# Patient Record
Sex: Female | Born: 1950
Health system: Midwestern US, Community
[De-identification: ages and names within clinical notes are randomized; demographics above are authoritative.]

## PROBLEM LIST (undated history)

## (undated) DIAGNOSIS — M1990 Unspecified osteoarthritis, unspecified site: Secondary | ICD-10-CM

## (undated) DIAGNOSIS — F419 Anxiety disorder, unspecified: Secondary | ICD-10-CM

## (undated) DIAGNOSIS — E7800 Pure hypercholesterolemia, unspecified: Secondary | ICD-10-CM

## (undated) DIAGNOSIS — J189 Pneumonia, unspecified organism: Secondary | ICD-10-CM

## (undated) DIAGNOSIS — N281 Cyst of kidney, acquired: Secondary | ICD-10-CM

## (undated) DIAGNOSIS — L299 Pruritus, unspecified: Secondary | ICD-10-CM

## (undated) DIAGNOSIS — G479 Sleep disorder, unspecified: Secondary | ICD-10-CM

## (undated) DIAGNOSIS — I10 Essential (primary) hypertension: Secondary | ICD-10-CM

## (undated) DIAGNOSIS — T4145XA Adverse effect of unspecified anesthetic, initial encounter: Secondary | ICD-10-CM

## (undated) DIAGNOSIS — D497 Neoplasm of unspecified behavior of endocrine glands and other parts of nervous system: Secondary | ICD-10-CM

## (undated) DIAGNOSIS — B029 Zoster without complications: Secondary | ICD-10-CM

## (undated) DIAGNOSIS — R002 Palpitations: Secondary | ICD-10-CM

## (undated) DIAGNOSIS — L304 Erythema intertrigo: Secondary | ICD-10-CM

## (undated) DIAGNOSIS — B009 Herpesviral infection, unspecified: Secondary | ICD-10-CM

## (undated) DIAGNOSIS — M545 Low back pain, unspecified: Secondary | ICD-10-CM

## (undated) DIAGNOSIS — G062 Extradural and subdural abscess, unspecified: Secondary | ICD-10-CM

## (undated) DIAGNOSIS — I509 Heart failure, unspecified: Secondary | ICD-10-CM

## (undated) DIAGNOSIS — T8859XA Other complications of anesthesia, initial encounter: Secondary | ICD-10-CM

## (undated) DIAGNOSIS — A498 Other bacterial infections of unspecified site: Secondary | ICD-10-CM

## (undated) DIAGNOSIS — G43909 Migraine, unspecified, not intractable, without status migrainosus: Secondary | ICD-10-CM

## (undated) DIAGNOSIS — K219 Gastro-esophageal reflux disease without esophagitis: Secondary | ICD-10-CM

## (undated) DIAGNOSIS — G8929 Other chronic pain: Secondary | ICD-10-CM

## (undated) DIAGNOSIS — J42 Unspecified chronic bronchitis: Secondary | ICD-10-CM

## (undated) DIAGNOSIS — K589 Irritable bowel syndrome without diarrhea: Secondary | ICD-10-CM

## (undated) DIAGNOSIS — M4647 Discitis, unspecified, lumbosacral region: Secondary | ICD-10-CM

## (undated) DIAGNOSIS — M4804 Spinal stenosis, thoracic region: Secondary | ICD-10-CM

## (undated) DIAGNOSIS — Z79899 Other long term (current) drug therapy: Secondary | ICD-10-CM

## (undated) DIAGNOSIS — D472 Monoclonal gammopathy: Secondary | ICD-10-CM

## (undated) DIAGNOSIS — C9000 Multiple myeloma not having achieved remission: Secondary | ICD-10-CM

## (undated) DIAGNOSIS — M5030 Other cervical disc degeneration, unspecified cervical region: Secondary | ICD-10-CM

## (undated) DIAGNOSIS — Z1231 Encounter for screening mammogram for malignant neoplasm of breast: Secondary | ICD-10-CM

## (undated) DIAGNOSIS — M542 Cervicalgia: Secondary | ICD-10-CM

## (undated) DIAGNOSIS — I5032 Chronic diastolic (congestive) heart failure: Principal | ICD-10-CM

## (undated) DIAGNOSIS — R109 Unspecified abdominal pain: Secondary | ICD-10-CM

## (undated) DIAGNOSIS — Z1211 Encounter for screening for malignant neoplasm of colon: Secondary | ICD-10-CM

## (undated) DIAGNOSIS — E559 Vitamin D deficiency, unspecified: Secondary | ICD-10-CM

## (undated) DIAGNOSIS — M5416 Radiculopathy, lumbar region: Secondary | ICD-10-CM

## (undated) DIAGNOSIS — K2950 Unspecified chronic gastritis without bleeding: Secondary | ICD-10-CM

## (undated) DIAGNOSIS — R0600 Dyspnea, unspecified: Secondary | ICD-10-CM

## (undated) DIAGNOSIS — M810 Age-related osteoporosis without current pathological fracture: Secondary | ICD-10-CM

## (undated) DIAGNOSIS — C180 Malignant neoplasm of cecum: Secondary | ICD-10-CM

## (undated) DIAGNOSIS — D709 Neutropenia, unspecified: Secondary | ICD-10-CM

## (undated) DIAGNOSIS — N179 Acute kidney failure, unspecified: Secondary | ICD-10-CM

## (undated) DIAGNOSIS — R1084 Generalized abdominal pain: Secondary | ICD-10-CM

## (undated) DIAGNOSIS — Z91041 Radiographic dye allergy status: Secondary | ICD-10-CM

## (undated) DIAGNOSIS — Z0000 Encounter for general adult medical examination without abnormal findings: Secondary | ICD-10-CM

## (undated) DIAGNOSIS — R0689 Other abnormalities of breathing: Secondary | ICD-10-CM

## (undated) HISTORY — PX: BACK SURGERY: SHX140

## (undated) HISTORY — PX: CHOLECYSTECTOMY OPEN: SUR202

## (undated) HISTORY — PX: BREAST LUMPECTOMY: SHX2

## (undated) HISTORY — PX: DILATION AND CURETTAGE OF UTERUS: SHX78

## (undated) HISTORY — DX: Extradural and subdural abscess, unspecified: G06.2

## (undated) HISTORY — DX: Discitis, unspecified, lumbosacral region: M46.47

## (undated) HISTORY — PX: CARPAL TUNNEL RELEASE: SHX101

## (undated) HISTORY — PX: TUBAL LIGATION: SHX77

## (undated) HISTORY — DX: Herpesviral infection, unspecified: B00.9

## (undated) HISTORY — PX: VENTRAL HERNIA REPAIR: SHX424

## (undated) HISTORY — PX: CYSTECTOMY: SUR359

## (undated) HISTORY — PX: COLECTOMY: SHX59

## (undated) HISTORY — PX: SALPINGOOPHORECTOMY: SHX82

## (undated) HISTORY — PX: HERNIA REPAIR: SHX51

## (undated) HISTORY — PX: COLONOSCOPY: SHX174

## (undated) HISTORY — DX: Zoster without complications: B02.9

## (undated) HISTORY — DX: Other bacterial infections of unspecified site: A49.8

## (undated) HISTORY — PX: JOINT REPLACEMENT: SHX530

## (undated) HISTORY — PX: BLADDER SUSPENSION: SHX72

## (undated) HISTORY — PX: ESOPHAGOGASTRODUODENOSCOPY: SHX1529

## (undated) HISTORY — DX: Neoplasm of unspecified behavior of endocrine glands and other parts of nervous system: D49.7

## (undated) HISTORY — DX: Pruritus, unspecified: L29.9

## (undated) HISTORY — PX: APPENDECTOMY: SHX54

## (undated) HISTORY — DX: Erythema intertrigo: L30.4

## (undated) HISTORY — PX: TOTAL KNEE ARTHROPLASTY: SHX125

## (undated) HISTORY — PX: ABDOMINAL HYSTERECTOMY: SHX81

## (undated) HISTORY — PX: FRACTURE SURGERY: SHX138

---
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
## 1977-08-19 HISTORY — PX: FEMUR FRACTURE SURGERY: SHX633

## 1978-08-19 HISTORY — PX: FEMUR HARDWARE REMOVAL: SUR1124

## 2004-06-14 ENCOUNTER — Emergency Department (HOSPITAL_COMMUNITY): Admission: EM | Admit: 2004-06-14 | Discharge: 2004-06-14 | Payer: Self-pay | Admitting: Emergency Medicine

## 2004-08-14 ENCOUNTER — Emergency Department (HOSPITAL_COMMUNITY): Admission: EM | Admit: 2004-08-14 | Discharge: 2004-08-14 | Payer: Self-pay | Admitting: Family Medicine

## 2005-01-22 ENCOUNTER — Ambulatory Visit (HOSPITAL_BASED_OUTPATIENT_CLINIC_OR_DEPARTMENT_OTHER): Admission: RE | Admit: 2005-01-22 | Discharge: 2005-01-22 | Payer: Self-pay | Admitting: Orthopaedic Surgery

## 2005-01-22 ENCOUNTER — Ambulatory Visit (HOSPITAL_COMMUNITY): Admission: RE | Admit: 2005-01-22 | Discharge: 2005-01-22 | Payer: Self-pay | Admitting: Orthopaedic Surgery

## 2005-03-05 ENCOUNTER — Ambulatory Visit (HOSPITAL_COMMUNITY): Admission: RE | Admit: 2005-03-05 | Discharge: 2005-03-05 | Payer: Self-pay | Admitting: Orthopaedic Surgery

## 2005-03-05 ENCOUNTER — Ambulatory Visit (HOSPITAL_BASED_OUTPATIENT_CLINIC_OR_DEPARTMENT_OTHER): Admission: RE | Admit: 2005-03-05 | Discharge: 2005-03-05 | Payer: Self-pay | Admitting: Orthopaedic Surgery

## 2005-06-13 ENCOUNTER — Encounter: Admission: RE | Admit: 2005-06-13 | Discharge: 2005-06-13 | Payer: Self-pay | Admitting: Internal Medicine

## 2005-09-10 ENCOUNTER — Emergency Department (HOSPITAL_COMMUNITY): Admission: EM | Admit: 2005-09-10 | Discharge: 2005-09-10 | Payer: Self-pay | Admitting: Emergency Medicine

## 2005-09-12 ENCOUNTER — Emergency Department (HOSPITAL_COMMUNITY): Admission: EM | Admit: 2005-09-12 | Discharge: 2005-09-12 | Payer: Self-pay | Admitting: Emergency Medicine

## 2005-09-23 ENCOUNTER — Encounter: Admission: RE | Admit: 2005-09-23 | Discharge: 2005-09-23 | Payer: Self-pay | Admitting: Internal Medicine

## 2005-11-08 ENCOUNTER — Encounter: Admission: RE | Admit: 2005-11-08 | Discharge: 2005-11-08 | Payer: Self-pay | Admitting: Internal Medicine

## 2006-02-04 ENCOUNTER — Ambulatory Visit (HOSPITAL_COMMUNITY): Admission: RE | Admit: 2006-02-04 | Discharge: 2006-02-04 | Payer: Self-pay | Admitting: Gastroenterology

## 2006-02-04 ENCOUNTER — Encounter (INDEPENDENT_AMBULATORY_CARE_PROVIDER_SITE_OTHER): Payer: Self-pay | Admitting: Specialist

## 2006-05-16 ENCOUNTER — Encounter: Admission: RE | Admit: 2006-05-16 | Discharge: 2006-05-16 | Payer: Self-pay | Admitting: Internal Medicine

## 2006-06-30 ENCOUNTER — Encounter: Admission: RE | Admit: 2006-06-30 | Discharge: 2006-06-30 | Payer: Self-pay | Admitting: Internal Medicine

## 2006-07-28 ENCOUNTER — Emergency Department (HOSPITAL_COMMUNITY): Admission: EM | Admit: 2006-07-28 | Discharge: 2006-07-28 | Payer: Self-pay | Admitting: Emergency Medicine

## 2006-07-31 ENCOUNTER — Encounter: Admission: RE | Admit: 2006-07-31 | Discharge: 2006-07-31 | Payer: Self-pay | Admitting: Orthopaedic Surgery

## 2006-09-18 ENCOUNTER — Ambulatory Visit (HOSPITAL_COMMUNITY): Admission: RE | Admit: 2006-09-18 | Discharge: 2006-09-18 | Payer: Self-pay | Admitting: Cardiovascular Disease

## 2006-09-25 ENCOUNTER — Ambulatory Visit (HOSPITAL_COMMUNITY): Admission: RE | Admit: 2006-09-25 | Discharge: 2006-09-25 | Payer: Self-pay | Admitting: Orthopaedic Surgery

## 2006-10-08 ENCOUNTER — Encounter: Admission: RE | Admit: 2006-10-08 | Discharge: 2007-01-06 | Payer: Self-pay | Admitting: Orthopaedic Surgery

## 2006-11-06 ENCOUNTER — Emergency Department (HOSPITAL_COMMUNITY): Admission: EM | Admit: 2006-11-06 | Discharge: 2006-11-06 | Payer: Self-pay | Admitting: Emergency Medicine

## 2007-01-07 ENCOUNTER — Encounter: Admission: RE | Admit: 2007-01-07 | Discharge: 2007-04-07 | Payer: Self-pay | Admitting: Orthopaedic Surgery

## 2007-03-08 ENCOUNTER — Emergency Department (HOSPITAL_COMMUNITY): Admission: EM | Admit: 2007-03-08 | Discharge: 2007-03-08 | Payer: Self-pay | Admitting: Emergency Medicine

## 2007-07-23 ENCOUNTER — Encounter: Admission: RE | Admit: 2007-07-23 | Discharge: 2007-07-23 | Payer: Self-pay | Admitting: Internal Medicine

## 2007-11-16 ENCOUNTER — Inpatient Hospital Stay (HOSPITAL_COMMUNITY): Admission: EM | Admit: 2007-11-16 | Discharge: 2007-11-18 | Payer: Self-pay | Admitting: Emergency Medicine

## 2008-02-02 ENCOUNTER — Ambulatory Visit: Payer: Self-pay | Admitting: Vascular Surgery

## 2008-02-02 ENCOUNTER — Encounter (INDEPENDENT_AMBULATORY_CARE_PROVIDER_SITE_OTHER): Payer: Self-pay | Admitting: Emergency Medicine

## 2008-02-02 ENCOUNTER — Emergency Department (HOSPITAL_COMMUNITY): Admission: EM | Admit: 2008-02-02 | Discharge: 2008-02-02 | Payer: Self-pay | Admitting: Emergency Medicine

## 2008-05-10 ENCOUNTER — Inpatient Hospital Stay (HOSPITAL_COMMUNITY): Admission: RE | Admit: 2008-05-10 | Discharge: 2008-05-14 | Payer: Self-pay | Admitting: Orthopaedic Surgery

## 2008-06-08 ENCOUNTER — Emergency Department (HOSPITAL_COMMUNITY): Admission: EM | Admit: 2008-06-08 | Discharge: 2008-06-08 | Payer: Self-pay | Admitting: Family Medicine

## 2008-06-13 ENCOUNTER — Encounter: Admission: RE | Admit: 2008-06-13 | Discharge: 2008-07-12 | Payer: Self-pay | Admitting: Orthopaedic Surgery

## 2008-06-14 ENCOUNTER — Emergency Department (HOSPITAL_COMMUNITY): Admission: EM | Admit: 2008-06-14 | Discharge: 2008-06-14 | Payer: Self-pay | Admitting: Emergency Medicine

## 2008-09-13 ENCOUNTER — Encounter: Admission: RE | Admit: 2008-09-13 | Discharge: 2008-09-13 | Payer: Self-pay | Admitting: Internal Medicine

## 2009-01-08 ENCOUNTER — Emergency Department (HOSPITAL_COMMUNITY): Admission: EM | Admit: 2009-01-08 | Discharge: 2009-01-08 | Payer: Self-pay | Admitting: Emergency Medicine

## 2009-01-17 ENCOUNTER — Emergency Department (HOSPITAL_COMMUNITY): Admission: EM | Admit: 2009-01-17 | Discharge: 2009-01-17 | Payer: Self-pay | Admitting: Family Medicine

## 2009-03-27 ENCOUNTER — Emergency Department (HOSPITAL_COMMUNITY): Admission: EM | Admit: 2009-03-27 | Discharge: 2009-03-27 | Payer: Self-pay | Admitting: Family Medicine

## 2009-04-01 ENCOUNTER — Emergency Department (HOSPITAL_COMMUNITY): Admission: EM | Admit: 2009-04-01 | Discharge: 2009-04-01 | Payer: Self-pay | Admitting: Emergency Medicine

## 2009-04-28 ENCOUNTER — Encounter: Admission: RE | Admit: 2009-04-28 | Discharge: 2009-04-28 | Payer: Self-pay | Admitting: Orthopedic Surgery

## 2009-07-06 ENCOUNTER — Inpatient Hospital Stay (HOSPITAL_COMMUNITY): Admission: RE | Admit: 2009-07-06 | Discharge: 2009-07-11 | Payer: Self-pay | Admitting: General Surgery

## 2009-07-25 ENCOUNTER — Emergency Department (HOSPITAL_COMMUNITY): Admission: EM | Admit: 2009-07-25 | Discharge: 2009-07-25 | Payer: Self-pay | Admitting: Emergency Medicine

## 2009-08-05 ENCOUNTER — Emergency Department (HOSPITAL_COMMUNITY): Admission: EM | Admit: 2009-08-05 | Discharge: 2009-08-05 | Payer: Self-pay | Admitting: Emergency Medicine

## 2009-11-24 ENCOUNTER — Encounter: Admission: RE | Admit: 2009-11-24 | Discharge: 2009-11-24 | Payer: Self-pay | Admitting: Internal Medicine

## 2009-12-20 ENCOUNTER — Emergency Department (HOSPITAL_COMMUNITY): Admission: EM | Admit: 2009-12-20 | Discharge: 2009-12-20 | Payer: Self-pay | Admitting: Emergency Medicine

## 2010-11-06 LAB — BASIC METABOLIC PANEL
BUN: 7 mg/dL (ref 6–23)
Chloride: 105 mEq/L (ref 96–112)
Glucose, Bld: 98 mg/dL (ref 70–99)
Potassium: 3.8 mEq/L (ref 3.5–5.1)

## 2010-11-06 LAB — CBC
HCT: 40.6 % (ref 36.0–46.0)
MCHC: 32.8 g/dL (ref 30.0–36.0)
MCV: 83.5 fL (ref 78.0–100.0)
Platelets: 203 10*3/uL (ref 150–400)
RDW: 14.5 % (ref 11.5–15.5)

## 2010-11-06 LAB — URINALYSIS, ROUTINE W REFLEX MICROSCOPIC
Bilirubin Urine: NEGATIVE
Ketones, ur: NEGATIVE mg/dL
Nitrite: NEGATIVE
Specific Gravity, Urine: 1.016 (ref 1.005–1.030)
Urobilinogen, UA: 0.2 mg/dL (ref 0.0–1.0)

## 2010-11-06 LAB — DIFFERENTIAL
Basophils Absolute: 0 10*3/uL (ref 0.0–0.1)
Eosinophils Absolute: 0.3 10*3/uL (ref 0.0–0.7)
Eosinophils Relative: 6 % — ABNORMAL HIGH (ref 0–5)

## 2010-11-14 ENCOUNTER — Other Ambulatory Visit: Payer: Self-pay | Admitting: Internal Medicine

## 2010-11-14 DIAGNOSIS — Z1231 Encounter for screening mammogram for malignant neoplasm of breast: Secondary | ICD-10-CM

## 2010-11-20 LAB — URINALYSIS, ROUTINE W REFLEX MICROSCOPIC
Bilirubin Urine: NEGATIVE
Glucose, UA: NEGATIVE mg/dL
Hgb urine dipstick: NEGATIVE
Specific Gravity, Urine: 1.021 (ref 1.005–1.030)
Urobilinogen, UA: 0.2 mg/dL (ref 0.0–1.0)
pH: 5.5 (ref 5.0–8.0)

## 2010-11-20 LAB — COMPREHENSIVE METABOLIC PANEL
AST: 14 U/L (ref 0–37)
Albumin: 3.1 g/dL — ABNORMAL LOW (ref 3.5–5.2)
Chloride: 105 mEq/L (ref 96–112)
Creatinine, Ser: 1 mg/dL (ref 0.4–1.2)
GFR calc Af Amer: >60 mL/min (ref >60)
Potassium: 3.8 mEq/L (ref 3.5–5.1)
Total Bilirubin: 0.4 mg/dL (ref 0.3–1.2)

## 2010-11-20 LAB — URINE MICROSCOPIC-ADD ON

## 2010-11-20 LAB — DIFFERENTIAL
Basophils Absolute: 0 10*3/uL (ref 0.0–0.1)
Eosinophils Relative: 2 % (ref 0–5)
Lymphocytes Relative: 24 % (ref 12–46)
Monocytes Absolute: 0.4 10*3/uL (ref 0.1–1.0)

## 2010-11-20 LAB — CBC
MCV: 85.8 fL (ref 78.0–100.0)
Platelets: 294 10*3/uL (ref 150–400)
WBC: 7.1 10*3/uL (ref 4.0–10.5)

## 2010-11-21 LAB — BASIC METABOLIC PANEL
BUN: 7 mg/dL (ref 6–23)
CO2: 28 mEq/L (ref 19–32)
Calcium: 7.9 mg/dL — ABNORMAL LOW (ref 8.4–10.5)
Chloride: 103 mEq/L (ref 96–112)
Creatinine, Ser: 1.12 mg/dL (ref 0.4–1.2)
GFR calc Af Amer: >60 mL/min (ref >60)
Glucose, Bld: 140 mg/dL — ABNORMAL HIGH (ref 70–99)

## 2010-11-21 LAB — COMPREHENSIVE METABOLIC PANEL
ALT: 14 U/L (ref 0–35)
Alkaline Phosphatase: 69 U/L (ref 39–117)
BUN: 8 mg/dL (ref 6–23)
Chloride: 105 mEq/L (ref 96–112)
Glucose, Bld: 103 mg/dL — ABNORMAL HIGH (ref 70–99)
Potassium: 4 mEq/L (ref 3.5–5.1)
Total Bilirubin: 0.5 mg/dL (ref 0.3–1.2)

## 2010-11-21 LAB — BLOOD GAS, ARTERIAL
Acid-Base Excess: 2.9 mmol/L — ABNORMAL HIGH (ref 0.0–2.0)
Acid-base deficit: 2.6 mmol/L — ABNORMAL HIGH (ref 0.0–2.0)
Drawn by: 308601
FIO2: 0.4 %
FIO2: 0.5 %
MECHVT: 700 mL
Patient temperature: 98.6
RATE: 10 resp/min
TCO2: 22.6 mmol/L (ref 0–100)
TCO2: 23 mmol/L (ref 0–100)
pCO2 arterial: 34.8 mmHg — ABNORMAL LOW (ref 35.0–45.0)
pCO2 arterial: 54.6 mmHg — ABNORMAL HIGH (ref 35.0–45.0)
pO2, Arterial: 107 mmHg — ABNORMAL HIGH (ref 80.0–100.0)

## 2010-11-21 LAB — DIFFERENTIAL
Basophils Absolute: 0 10*3/uL (ref 0.0–0.1)
Basophils Relative: 1 % (ref 0–1)
Eosinophils Absolute: 0.2 10*3/uL (ref 0.0–0.7)
Monocytes Absolute: 0.4 10*3/uL (ref 0.1–1.0)
Neutro Abs: 3 10*3/uL (ref 1.7–7.7)
Neutrophils Relative %: 54 % (ref 43–77)

## 2010-11-21 LAB — CBC
HCT: 40.8 % (ref 36.0–46.0)
Hemoglobin: 13.2 g/dL (ref 12.0–15.0)
MCHC: 32.1 g/dL (ref 30.0–36.0)
MCV: 87.7 fL (ref 78.0–100.0)
Platelets: 204 10*3/uL (ref 150–400)
RBC: 4.17 MIL/uL (ref 3.87–5.11)
RBC: 4.72 MIL/uL (ref 3.87–5.11)
RDW: 13.7 % (ref 11.5–15.5)
WBC: 5.6 10*3/uL (ref 4.0–10.5)

## 2010-11-21 LAB — BRAIN NATRIURETIC PEPTIDE: Pro B Natriuretic peptide (BNP): 73.6 pg/mL (ref 0.0–100.0)

## 2010-11-21 LAB — TYPE AND SCREEN: ABO/RH(D): B POS

## 2010-11-21 LAB — PROTIME-INR: INR: 1.03 (ref 0.00–1.49)

## 2010-11-24 LAB — COMPREHENSIVE METABOLIC PANEL
ALT: 20 U/L (ref 0–35)
AST: 22 U/L (ref 0–37)
Albumin: 3.3 g/dL — ABNORMAL LOW (ref 3.5–5.2)
CO2: 26 mEq/L (ref 19–32)
Calcium: 8.5 mg/dL (ref 8.4–10.5)
GFR calc Af Amer: >60 mL/min (ref >60)
GFR calc non Af Amer: >60 mL/min (ref >60)
Sodium: 138 mEq/L (ref 135–145)
Total Protein: 7 g/dL (ref 6.0–8.3)

## 2010-11-24 LAB — DIFFERENTIAL
Basophils Absolute: 0.1 10*3/uL (ref 0.0–0.1)
Eosinophils Relative: 5 % (ref 0–5)
Monocytes Absolute: 0.5 10*3/uL (ref 0.1–1.0)
Neutrophils Relative %: 51 % (ref 43–77)

## 2010-11-24 LAB — CBC
MCV: 85.4 fL (ref 78.0–100.0)
Platelets: 214 10*3/uL (ref 150–400)
RDW: 13.7 % (ref 11.5–15.5)
WBC: 6.6 10*3/uL (ref 4.0–10.5)

## 2010-11-24 LAB — URINALYSIS, ROUTINE W REFLEX MICROSCOPIC
Glucose, UA: NEGATIVE mg/dL
Protein, ur: NEGATIVE mg/dL
pH: 5.5 (ref 5.0–8.0)

## 2010-11-24 LAB — URINE MICROSCOPIC-ADD ON

## 2010-11-26 ENCOUNTER — Other Ambulatory Visit: Payer: Self-pay | Admitting: Internal Medicine

## 2010-11-26 ENCOUNTER — Ambulatory Visit
Admission: RE | Admit: 2010-11-26 | Discharge: 2010-11-26 | Disposition: A | Discharge status: Home / Self Care | Payer: Medicaid Other | Source: Ambulatory Visit | Attending: Internal Medicine | Admitting: Internal Medicine

## 2010-11-26 DIAGNOSIS — Z1231 Encounter for screening mammogram for malignant neoplasm of breast: Secondary | ICD-10-CM

## 2010-11-26 DIAGNOSIS — N6452 Nipple discharge: Secondary | ICD-10-CM

## 2010-11-28 ENCOUNTER — Other Ambulatory Visit: Payer: Self-pay | Admitting: Internal Medicine

## 2010-11-28 ENCOUNTER — Ambulatory Visit
Admission: RE | Admit: 2010-11-28 | Discharge: 2010-11-28 | Disposition: A | Discharge status: Home / Self Care | Payer: Medicaid Other | Source: Ambulatory Visit | Attending: Internal Medicine | Admitting: Internal Medicine

## 2010-11-28 DIAGNOSIS — N6452 Nipple discharge: Secondary | ICD-10-CM

## 2010-12-11 ENCOUNTER — Ambulatory Visit
Admission: RE | Admit: 2010-12-11 | Discharge: 2010-12-11 | Disposition: A | Discharge status: Home / Self Care | Payer: Medicaid Other | Source: Ambulatory Visit | Attending: Internal Medicine | Admitting: Internal Medicine

## 2010-12-11 DIAGNOSIS — N6452 Nipple discharge: Secondary | ICD-10-CM

## 2010-12-18 HISTORY — PX: CARDIAC CATHETERIZATION: SHX172

## 2011-01-01 NOTE — Discharge Summary (Signed)
[name], [name]              ACCOUNT NO.:  [id number]   MEDICAL RECORD NO.:  [id number]          PATIENT TYPE:  INP   LOCATION:  3733                         FACILITY:  [facility]   PHYSICIAN:  Fleet Contras, M.D.    DATE OF BIRTH:  10-30-1950   DATE OF ADMISSION:  11/16/2007  DATE OF DISCHARGE:  11/18/2007                               DISCHARGE SUMMARY   HISTORY OF PRESENTING ILLNESS:  [name] is a 60 year old African  American lady with multiple medical problems including systemic  hypertension, congestive heart failure, chronic bronchitis,  dyslipidemia, gastroesophageal reflux disease, depression and anxiety,  and degenerative joint disease of the shoulders.  She came to the  emergency room at [facility] with 1-day history of chest pain,  centrally located, sharp, nonradiating, associated with nausea,  heartburn, abdominal cramp/pain, and episodes of watery diarrhea.  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  She stated that she  still has some dull ache which has been not as severe as it was.  She  had been nauseous but no vomiting.  She had no shortness of breath,  headaches, dizziness, palpitations, or diaphoresis.  At the emergency  room, her vital signs were stable.  An initial workup including CK,  troponin, EKG, and chest x-ray, were all negative.  The patient was  admitted to rule out acute coronary syndrome.   HOSPITAL COURSE:  On admission, the patient was placed on telemetry  monitoring.  She was on Vicodin 5/500 1-2 p.o. q.6 h. p.r.n. for pain  and morphine 1-2 mg q.4 h. p.r.n. for pain, Zofran 4 mg IV p.r.n. for  nausea and vomiting q.4 h.  She had a recent acute sinusitis and  bronchitis and was on Z-Pak and Tussionex at home.  She had been  coughing quite a bit.  On admission, she was tender to the  anterior  chest wall on palpation, and serial CKs and troponin were performed x3  were all negative, ruling out acute coronary syndrome.  Chest x-ray  showed no acute infiltrates, and her BNP was less than 30.   PHYSICAL EXAMINATION:  Her vital signs have remained stable throughout  the hospitalization, and today she is feeling much better.  She has no  further chest pain, no shortness of breath.  She has no fevers or  chills.  She is tolerating some oral intake.  VITAL SIGNS:  This morning show the blood pressure of 104/64, heart rate  of 74, respiratory rate of 20, temperature 99.6, and O2 sats on room air  is 98%.  She is not pale.  She is not icteric.  She has no cyanosis.  CHEST:  Clear to auscultation.  HEART:  Sounds S1 and S2, heard with no murmurs.  ABDOMEN:  Full, obese, soft, nontender, and no masses.  Bowel sounds are  present.  EXTREMITIES:  Show no edema.   LABORATORY DATA:  White count is 6.8, hemoglobin 13.6, and hematocrit  41.2.  Sodium was 137, potassium 4.4, chloride 99, bicarbonate of 28,  BUN is 16, creatinine 1.5, glucose is 118.   ASSESSMENT:  [name] is a 60-year African American lady with multiple  medical problems, admitted with atypical chest pain.  Acute coronary  syndrome has been ruled out, and she is now considered stable for  discharge home today.   DISCHARGE DIAGNOSES:  1. Atypical chest pain.  2. Acute sinusitis and bronchitis, currently on antibiotics and      bronchodilators, with improvement.  3. Gastroenteritis, likely related to her medications.  4. Gastroesophageal reflux disease.  5. Systemic hypertension, well controlled.   PLAN OF CARE:  She will be discharged home today.   DISCHARGE MEDICATIONS:  Will include:  1. Lotrel 5/40 once a day.  2. Lipitor 10 mg daily.  3. Potassium chloride 10 mEq once a day.  4. Protonix 40 mg daily.  5. Ambien 10 mg at bedtime.  6. Celexa 20 mg daily.  7. Lasix 40 mg daily.  8. Vicodin 5/500 one  to two p.o. q.6 h. p.r.n.  9. Avelox 400 mg daily for 5 more days.  10.Tussionex 5 mL b.i.d. p.r.n.   This plan of care has been discussed with her.  Her questions are  answered.  She will follow up with me in about 3 weeks.  We will resume  her home health aid.      Fleet Contras, M.D.  Electronically Signed     EA/MEDQ  D:  11/18/2007  T:  11/18/2007  Job:  [id number]

## 2011-01-01 NOTE — Op Note (Signed)
[name], [name]              ACCOUNT NO.:  [id number]   MEDICAL RECORD NO.:  [id number]          PATIENT TYPE:  INP   LOCATION:  5034                         FACILITY:  [facility]   PHYSICIAN:  Lubertha Basque. Dalldorf, M.D.DATE OF BIRTH:  Jan 27, 1951   DATE OF PROCEDURE:  05/10/2008  DATE OF DISCHARGE:                               OPERATIVE REPORT   PREOPERATIVE DIAGNOSIS:  Right knee degenerative arthritis.   POSTOPERATIVE DIAGNOSIS:  Right knee degenerative arthritis.   PROCEDURE:  Right total knee replacement.   ANESTHESIA:  General and block.   ATTENDING SURGEON:  Lubertha Basque. Jerl Santos, MD   ASSISTANT:  Lindwood Qua, PA   INDICATION FOR PROCEDURE:  The patient is a 60 year old woman with a  long history of painful knees.  This has persisted despite various oral  anti-inflammatories and injectables.  She has had an arthroscopy many  years ago.  By x-ray, she has advanced degenerative change and she has  pain which limits her ability to rest and walk.  She is offered a knee  replacement.  Informed operative consent was obtained after discussion  of possible complications including reaction to anesthesia, infection,  DVT, PE, and death.   SUMMARY OF FINDINGS AND PROCEDURE:  Under general anesthesia and a block  through a standard longitudinal anterior approach, a right knee  replacement was performed.  She had advanced degenerative change in all  three compartments and fair bone quality.  We addressed her problem with  a cemented DePuy LCS System.  We used a standard plus femur with a 3 MBT  revision tray to address her stature.  We also placed a 10-mm deep-dish  spacer and a 38-mm all polyethylene patella.  Bryna Colander assisted  throughout and was invaluable to the completion of the case in that he  helped position and retract while I performed the procedure.  He also  closed simultaneously to help minimize OR time.   DESCRIPTION OF PROCEDURE:  The patient was taken to the  operating suite  where general anesthetic was applied without difficulty.  She was also  given a block in the preanesthesia area.  She was positioned supine and  prepped and draped in normal sterile fashion.  After the administration  of IV Kefzol, the right leg was elevated, exsanguinated, and tourniquet  inflated about the thigh.  A longitudinal anterior incision was made  with dissection down the extensor mechanism.  All appropriate anti-  infected measures were used including closed hooded exhaust systems for  each member of the surgical team, preoperative IV antibiotic, and  Betadine impregnated drape.  A medial parapatellar incision was made.  The kneecap was flipped and the knee flexed.  Some residual meniscal  tissues were removed along with the ACL and PCL.  Most of the fat pad  was also excised.  I placed an intramedullary guide in the tibia to make  a cut with a slight posterior tilt.  We then placed another  intramedullary guide in the femur to make anterior and posterior cuts  creating a flexion gap of 10 mm.  A second intramedullary guide was  placed in the femur to make a distal cut creating an equal extension gap  of 10 mm balancing the knee.  Minimal soft tissue release was required  off the tibia.  The femur sized to a standard plus and the tibia to a 3  with appropriate guides placed and utilized.  We did ream for a short  stem on the tibia to address her stature.  The patella was cut down in  thickness by 9 mm to 15 and sized to a 38 with the appropriate guide  placed and utilized.  Trial reduction was done with all these components  and she easily came to full extension and flexed well.  The patella  tracked well.  Trial components removed followed by pulsatile lavage  irrigation of all bony surfaces.  Cement was mixed including Zinacef and  was pressurized onto all 3 bones followed by placement of the  aforementioned DePuy LCS components.  Excess cement was trimmed  and  pressure was held until the cement had hardened.  The tourniquet was  deflated and a small amount of bleeding was easily controlled with Bovie  cautery.  The knee again was irrigated followed by placement of a drain  exiting superolaterally.  The extensor mechanism was reapproximated with  #1 Vicryl in interrupted fashion followed by subcutaneous  reapproximation with 0 and 2-0 undyed Vicryl and skin closure with  staples.  The knee easily flexed to 95 against gravity at the end of the  case which was roughly her preoperative motion.  Adaptic was applied  followed by dry gauze and a loose Ace wrap.  Estimated blood loss and  intraoperative fluids obtained from anesthesia records as can accurate  tourniquet time.   DISPOSITION:  The patient was extubated in the operating room and taken  to recovery room in stable addition.  She is to be admitted to the  Orthopedic Surgery Service for appropriate postop care to include  preoperative antibiotics and Coumadin plus Lovenox for DVT prophylaxis.      Lubertha Basque Jerl Santos, M.D.  Electronically Signed     PGD/MEDQ  D:  05/10/2008  T:  05/11/2008  Job:  [id number]

## 2011-01-01 NOTE — Discharge Summary (Signed)
[name], [name]              ACCOUNT NO.:  [id number]   MEDICAL RECORD NO.:  [id number]          PATIENT TYPE:  INP   LOCATION:  5034                         FACILITY:  [facility]   PHYSICIAN:  Lubertha Basque. Dalldorf, M.D.DATE OF BIRTH:  [birth year]   DATE OF ADMISSION:  05/10/2008  DATE OF DISCHARGE:  05/14/2008                               DISCHARGE SUMMARY   ADMITTING DIAGNOSES:  1. Right knee end-stage degenerative joint disease.  2. Depression.  3. Hypertension.  4. Esophageal reflux.   DISCHARGE DIAGNOSES:  1. Right knee end-stage degenerative joint disease.  2. Depression.  3. Hypertension.  4. Esophageal reflux.   OPERATIONS:  Right total knee replacement.   BRIEF HISTORY:  [name] is a 60 year old white female patient well-  known to our practice complaining of increasing right knee pain.  She  will have pain when she walks and pain when she sleeps at nighttime.  Failed injection and anti-inflammatory medicines.  Her x-rays reveal end-  stage DJD, particularly of the medial compartment.   PERTINENT LABORATORY AND X-RAY FINDINGS:  Hemoglobin 12.1, WBCs 8.9, and  platelets 162.  Serial INRs were done as she was on low-dose Coumadin  protocol.  DVT prophylaxis per Pharmacy.  Sodium 136, potassium 3.7,  chloride 95, CO2 32, glucose 101, BUN 6, and creatinine 0.9.   COURSE IN THE HOSPITAL:  She was admitted postoperatively and placed on  a variety of p.o. and IM analgesics for pain which included a PCA  Dilaudid pump, IV of lactated Ringer's, Coumadin, and Lovenox protocol  per Pharmacy, IV Ancef 1 gram q.8 h. x3 doses and then various oral  medications including muscle relaxers, ferrous sulfate, oral pain  medicines, antiemetics, etc. and knee-high TEDs, incentive spirometry,  CPM machine 0-50, to advance as tolerated.  Physical therapy for out of  bed and weightbearing as tolerated as well.  O2 2 L nasal cannula with  pulse ox and she was kept on her home medicines.   First day postop,  blood pressure was within normal limits and temperature 98.  Lungs were  clear.  Abdomen was soft.  Knee dressing and drain were in place and  normal.  Good neurovascular status.  Foley catheter was in and later  discontinued that day.  [facility] was contacted for help with  the patient on discharge.  On day #2, dressing was changed, wound was  noted to be benign, and drain was pulled.  Lungs continued to be clear.  She was afebrile and vital signs remained normal throughout her hospital  stay.  She was eventually discharged home.   CONDITION ON DISCHARGE:  Improved.   DISCHARGE INSTRUCTIONS:  May change dressing daily.  Low-sodium heart-  healthy diet.  Weightbearing as tolerated.  Dr. Jerl Santos to be seen in  10 days or sooner if there is any problem of infection, [phone].  Coumadin for  2 weeks dose regulated by Pharmacy, Percocet one or two q.4-6 p.r.n.  pain as needed and then followup with Home Care for home therapy and  blood draws.  She will be kept on her home  dose of Lasix, Benicar, and  Catapres patch - 3 to change weekly on Tuesday.      Lindwood Qua, P.A.      Lubertha Basque Jerl Santos, M.D.  Electronically Signed    MC/MEDQ  D:  06/14/2008  T:  06/14/2008  Job:  [id number]

## 2011-01-03 ENCOUNTER — Ambulatory Visit (HOSPITAL_COMMUNITY)
Admission: RE | Admit: 2011-01-03 | Discharge: 2011-01-03 | Disposition: A | Discharge status: Home / Self Care | Payer: Medicaid Other | Source: Ambulatory Visit | Attending: General Surgery | Admitting: General Surgery

## 2011-01-03 ENCOUNTER — Other Ambulatory Visit (HOSPITAL_COMMUNITY): Payer: Self-pay | Admitting: General Surgery

## 2011-01-03 ENCOUNTER — Encounter (HOSPITAL_COMMUNITY)
Admission: RE | Admit: 2011-01-03 | Discharge: 2011-01-03 | Disposition: A | Discharge status: Home / Self Care | Payer: Medicaid Other | Source: Ambulatory Visit | Attending: General Surgery | Admitting: General Surgery

## 2011-01-03 DIAGNOSIS — Z01811 Encounter for preprocedural respiratory examination: Secondary | ICD-10-CM

## 2011-01-03 DIAGNOSIS — Z01812 Encounter for preprocedural laboratory examination: Secondary | ICD-10-CM | POA: Insufficient documentation

## 2011-01-03 DIAGNOSIS — Z01810 Encounter for preprocedural cardiovascular examination: Secondary | ICD-10-CM | POA: Insufficient documentation

## 2011-01-03 DIAGNOSIS — I509 Heart failure, unspecified: Secondary | ICD-10-CM | POA: Insufficient documentation

## 2011-01-03 DIAGNOSIS — C50919 Malignant neoplasm of unspecified site of unspecified female breast: Secondary | ICD-10-CM | POA: Insufficient documentation

## 2011-01-03 LAB — CBC
HCT: 44.3 % (ref 36.0–46.0)
Hemoglobin: 14.8 g/dL (ref 12.0–15.0)
MCHC: 33.4 g/dL (ref 30.0–36.0)
MCV: 86.4 fL (ref 78.0–100.0)
RDW: 13.3 % (ref 11.5–15.5)

## 2011-01-03 LAB — DIFFERENTIAL
Eosinophils Relative: 5 % (ref 0–5)
Lymphocytes Relative: 40 % (ref 12–46)
Lymphs Abs: 2.4 10*3/uL (ref 0.7–4.0)
Monocytes Absolute: 0.5 10*3/uL (ref 0.1–1.0)
Monocytes Relative: 8 % (ref 3–12)
Neutro Abs: 2.9 10*3/uL (ref 1.7–7.7)

## 2011-01-03 LAB — COMPREHENSIVE METABOLIC PANEL
AST: 17 U/L (ref 0–37)
Alkaline Phosphatase: 77 U/L (ref 39–117)
BUN: 8 mg/dL (ref 6–23)
CO2: 33 mEq/L — ABNORMAL HIGH (ref 19–32)
Chloride: 101 mEq/L (ref 96–112)
Creatinine, Ser: 0.93 mg/dL (ref 0.4–1.2)
GFR calc non Af Amer: >60 mL/min (ref >60)
Potassium: 3.7 mEq/L (ref 3.5–5.1)
Total Bilirubin: 0.4 mg/dL (ref 0.3–1.2)

## 2011-01-03 LAB — SURGICAL PCR SCREEN: MRSA, PCR: NEGATIVE

## 2011-01-04 ENCOUNTER — Other Ambulatory Visit: Payer: Self-pay | Admitting: General Surgery

## 2011-01-04 ENCOUNTER — Ambulatory Visit (HOSPITAL_COMMUNITY)
Admission: RE | Admit: 2011-01-04 | Discharge: 2011-01-04 | Disposition: A | Discharge status: Home / Self Care | Payer: Medicaid Other | Source: Ambulatory Visit | Attending: General Surgery | Admitting: General Surgery

## 2011-01-04 DIAGNOSIS — I509 Heart failure, unspecified: Secondary | ICD-10-CM | POA: Insufficient documentation

## 2011-01-04 DIAGNOSIS — N6089 Other benign mammary dysplasias of unspecified breast: Secondary | ICD-10-CM | POA: Insufficient documentation

## 2011-01-04 DIAGNOSIS — D249 Benign neoplasm of unspecified breast: Secondary | ICD-10-CM | POA: Insufficient documentation

## 2011-01-04 DIAGNOSIS — I10 Essential (primary) hypertension: Secondary | ICD-10-CM | POA: Insufficient documentation

## 2011-01-04 NOTE — Op Note (Signed)
NAMESHERLIN, SONIER              ACCOUNT NO.:  [id number]   MEDICAL RECORD NO.:  [id number]          PATIENT TYPE:  AMB   LOCATION:  DSC                          FACILITY:  [facility]   PHYSICIAN:  Lubertha Basque. Dalldorf, M.D.DATE OF BIRTH:  Dec 19, 1950   DATE OF PROCEDURE:  03/05/2005  DATE OF DISCHARGE:                                 OPERATIVE REPORT   PREOPERATIVE DIAGNOSIS:  Right carpal tunnel syndrome.   POSTOPERATIVE DIAGNOSIS:  Right carpal tunnel syndrome.   PROCEDURE:  Right carpal tunnel release.   ANESTHESIA:  Bier block and MAC.   ATTENDING SURGEON:  Lubertha Basque. Jerl Santos, M.D.   ASSISTANT:  Lindwood Qua, P.A.   INDICATIONS:  The patient is a 60 year old woman with a long history of  bilateral hand and wrist difficulty.  She has had nerve studies done which  show severe bilateral carpal tunnel syndrome.  She has failed conservative  measures and is actually status post a successful carpal tunnel release on  the opposite side a couple of months ago.  She is offered a carpal tunnel  release on the right.  Informed operative consent was obtained after  discussion of possible complications of reaction to anesthesia, infection  and neurovascular injury.   DESCRIPTION OF PROCEDURE:  The patient was taken to the operating suite  where Bier block and MAC were applied.  She was positioned supine and  prepped in normal sterile fashion.  We did use Hibiclens scrub rather then  DuraPrep, as she expressed an allergy to iodine.  We then made a small  palmar incision ulnar to the thenar flexion crease and this did not cross  the wrist flexion crease.  Dissection was carried down through the palmar  fascia to expose the transverse carpal ligament, which was released under  direct visualization.  This did seem to be moderately thickened, applying  some compression to the median nerve directly underneath.  This was released  proximally through the distal fascia of the forearm and distally  to the  transverse arch of vessels.  She was then irrigated, followed by closure of  skin with nylon.  Adaptic was applied to the wound followed by dry gauze and  a loose Ace wrap, and a volar splint of plaster with the wrist in slight  extension.  The tourniquet was deflated during closure and her fingers  became pink and warm immediately.  Estimated blood loss and intraoperative  fluids as well as accurate tourniquet time can be obtained from anesthesia  records.   DISPOSITION:  The patient was taken to recovery room in stable addition.  Plans were for her to go home the same day and follow up in the office in  less than a week.  I will contact her by phone tonight.     PGD/MEDQ  D:  03/05/2005  T:  03/05/2005  Job:  [id number]

## 2011-01-04 NOTE — Op Note (Signed)
[name], [name]              ACCOUNT NO.:  [id number]   MEDICAL RECORD NO.:  [id number]          PATIENT TYPE:  OIB   LOCATION:  2550                         FACILITY:  [facility]   PHYSICIAN:  Lubertha Basque. Dalldorf, M.D.DATE OF BIRTH:  03-Jan-1951   DATE OF PROCEDURE:  09/25/2006  DATE OF DISCHARGE:                               OPERATIVE REPORT   PREOPERATIVE DIAGNOSIS:  1. Right shoulder degenerative joint disease.  2. Right shoulder impingement.   POSTOPERATIVE DIAGNOSIS:  1. Right shoulder degenerative joint disease.  2. Right shoulder impingement.   PROCEDURE:  1. Right shoulder arthroscopic acromioplasty.  2. Right shoulder arthroscopic glenohumeral debridement.   ANESTHESIA:  General.   ATTENDING SURGEON:  Lubertha Basque. Jerl Santos, M.D.   ASSISTANT:  Lindwood Qua, P.A.-C.   INDICATIONS FOR PROCEDURE:  The patient is a 60 year old woman with a  long history of right shoulder pain.  This has persisted despite oral  anti-inflammatories and attempts at injection.  She had an MRI scan  which showed some degenerative changes and things consistent with  impingement but no full thickness rotator cuff tear.  She has pain which  limits her ability to rest and use her arm.  She is offered an  arthroscopy.  The patient is of an extremely large size and we elected  to do this procedure in the main operating room as a result.  Frankly,  it was also difficult to tell whether we were able to get inside her  shoulder with the cortisone injection due to the amount of adipose  tissue around the joint.   SUMMARY OF FINDINGS OF PROCEDURE:  Under general anesthesia through  several portals, an arthroscopy of the right shoulder was performed.  This was very difficult and this was due to the size of the shoulder and  the surrounding adipose tissue.  Nevertheless, we were able to visualize  the glenohumeral joint which showed no evidence of biceps or rotator  cuff tear.  She did have grade 3  and grade 4 change on about 1/4 of the  humeral head and about the same amount of glenoid.  A thorough  chondroplasty was done here.  In the subacromial space, she had bursitis  consistent with impingement but no significant cuff tear was seen.  An  acromioplasty was done with a bur.  Bryna Colander assisted throughout  and was invaluable to the completion of the case in that he helped  position, retract, and pass instruments, so I could perform the  procedure.   DESCRIPTION OF PROCEDURE:  The patient was taken to the operating suite  where general anesthetic was applied without difficulty.  She was  positioned in a beach chair position and prepped and draped in a normal  sterile fashion.  After the administration of preop IV Kefzol, an  arthroscopy of the right shoulder was performed through approximately  five portals.  Findings were as noted above.  The procedure consisted of  first the chondroplasty of the glenohumeral portion of the joint where  we also removed some small loose bodies.  In the subacromial space, this  was followed  with an acromioplasty back to a flat surface done with the  bur in the lateral position.  The shoulder was thoroughly irrigated  followed by reapproximation of the portals with nylon.  The shoulder was  injected with some Marcaine with epinephrine and morphine.  Adaptic was  placed over the portals followed by dry gauze and tape.  Estimated blood  loss and interoperative fluids can be obtained from anesthesia records.   DISPOSITION:  The patient was extubated in the operating room and taken  to the recovery room in stable addition.  She will go home the same day  and follow up in the office in less than a week.  I will contact her by  phone tonight.      Lubertha Basque Jerl Santos, M.D.  Electronically Signed     PGD/MEDQ  D:  09/25/2006  T:  09/25/2006  Job:  [id number]

## 2011-01-04 NOTE — Op Note (Signed)
[name], [name]              ACCOUNT NO.:  [id number]   MEDICAL RECORD NO.:  [id number]          PATIENT TYPE:  AMB   LOCATION:  DSC                          FACILITY:  [facility]   PHYSICIAN:  Lubertha Basque. Dalldorf, M.D.DATE OF BIRTH:  1951/03/16   DATE OF PROCEDURE:  01/22/2005  DATE OF DISCHARGE:                                 OPERATIVE REPORT   PREOPERATIVE DIAGNOSIS:  Left carpal tunnel syndrome.   POSTOPERATIVE DIAGNOSIS:  Left carpal tunnel syndrome.   PROCEDURE:  Left carpal tunnel release.   ANESTHESIA:  Bier block, MAC.   ATTENDING SURGEON:  Lubertha Basque. Jerl Santos, M.D.   ASSISTANT:  Lindwood Qua, P.A.   INDICATIONS FOR PROCEDURE:  The patient is a 60 year old woman with long  history of bilateral hand pain and numbness worse on the left side. She has  failed conservative measures of oral anti-inflammatories and bracing. She  has undergone nerve conduction studies which show severe bilateral carpal  tunnel syndrome. With pain and numbness upon use and discomfort which wakes  her from sleep. She is offered a carpal tunnel release, first on the left.  Informed operative consent was obtained after discussion of possible  applications of reaction to anesthesia, infection and neurovascular injury.   DESCRIPTION OF PROCEDURE:  The patient was taken to the operating suite  where Bier block was applied to the level of her forearm. She was also given  some light sedation. She was positioned supine and prepped and draped in  normal sterile fashion. After administration of preoperative IV antibiotics,  a small incision was made on the palmar aspect of her hand ulnar to the  thenar flexion crease. This did not cross the wrist flexion crease.  Dissection was carried down to the palmar fascia to expose the transverse  carpal ligament which was very thick and obviously compressing the nerve.  This was released completely and the dissection was taken distally to the  transverse arch of  vessels and proximally to the distal fascia of the  forearm. The wound was then irrigated, followed by reapproximation of the  skin with nylon. Adaptic was applied to the wound, followed by dry gauze and  a plaster splint on the volar aspect of her wrist maintained slight  extension. The tourniquet was deflated during closure and her fingers became  pink and warm immediately. Estimated blood loss and intraoperative fluids  can be obtained from anesthesia records as can accurate tourniquet time.   DISPOSITION:  The patient was taken to recovery room in stable addition.  Plans were for her to go home the same day and follow up in the office in  less than a week.  I will contact her by phone tonight.       PGD/MEDQ  D:  01/22/2005  T:  01/22/2005  Job:  [id number]

## 2011-01-04 NOTE — Op Note (Signed)
NAMEDEVITA, NIES              ACCOUNT NO.:  [id number]   MEDICAL RECORD NO.:  [id number]          PATIENT TYPE:  AMB   LOCATION:  ENDO                         FACILITY:  [facility]   PHYSICIAN:  Graylin Shiver, M.D.   DATE OF BIRTH:  09/15/1950   DATE OF PROCEDURE:  02/04/2006  DATE OF DISCHARGE:                                 OPERATIVE REPORT   PROCEDURE:  Colonoscopy with polypectomy and biopsy.   ENDOSCOPIST:  Graylin Shiver, M.D.   INDICATIONS FOR PROCEDURE:  Chronic diarrhea.   Informed consent was obtained after explanation of the risks of bleeding,  infection and perforation.   PREMEDICATION:  Fentanyl 75 mcg IV, Versed 7.5 mg IV.   PROCEDURE:  With the patient in the left lateral decubitus position, a  rectal exam was performed.  No masses were felt.  The Olympus colonoscope  was inserted into the rectum and advanced around the colon to the cecum.  Cecal landmarks were identified.  The cecum and ascending colon were normal.  In the proximal transverse colon, there was a 5-mm sessile polyp removed by  snare cautery technique.  The descending colon and sigmoid revealed  diverticulosis.  I obtained random biopsies from the right and transverse  colon during this procedure to look for evidence of collagenous colitis.  The rectum looked normal.  She tolerated the procedure well without  complications.   IMPRESSION:  1.  Diverticulosis of the left colon.  2.  Five-millimeter transverse colon polyp, removed.   PLAN:  The pathology will be checked.           ______________________________  Graylin Shiver, M.D.     SFG/MEDQ  D:  02/04/2006  T:  02/05/2006  Job:  [id number]   cc:   Fleet Contras, M.D.  Fax: [phone]

## 2011-01-04 NOTE — Cardiovascular Report (Signed)
[name], [name]              ACCOUNT NO.:  [id number]   MEDICAL RECORD NO.:  [id number]          PATIENT TYPE:  OIB   LOCATION:  2899                         FACILITY:  [facility]   PHYSICIAN:  Nanetta Batty, M.D.   DATE OF BIRTH:  February 04, 1951   DATE OF PROCEDURE:  09/18/2006  DATE OF DISCHARGE:                            CARDIAC CATHETERIZATION   [name] is a 60 year old mildly overweight African-American female  referred for preoperative clearance before her upcoming shoulder  surgery.  She has positive risk factors but has no chest pain.  She has  had two admissions for CHF.  A Myoview stress test showed mild  anteroapical ischemia.  She presents now for outpatient diagnostic  coronary arteriography to define her anatomy and to risk stratify her.   PROCEDURE DESCRIPTION:  The patient was brought to the second floor  [facility] in the postabsorptive state.  She was  premedicated with p.o. Valium, IV Benadryl, Solu-Medrol, and Pepcid for  radiocontrast allergy prophylaxis.   Her right groin was prepped and draped in the usual sterile fashion.  One percent Xylocaine was used for local anesthesia.  A 6-French sheath  was inserted into the right femoral artery using standard Seldinger  technique.  A 6-French right and left Judkins diagnostic catheter as  well as a [place] pigtail catheter were used for selective coronary  angiography, left ventriculography, and distal abdominal aortography.  Visipaque dye was used for the entirety of the case.  Retrograde aortic,  ventricular, and pulmonary artery pressures were recorded.   HEMODYNAMICS:  1. Aortic systolic pressure 170, diastolic pressure 82.  2. Left ventricular systolic pressure 171; end-diastolic pressure 20.   SELECTIVE CORONARY ANGIOGRAPHY:  1. Left main normal.  2. LAD normal.  3. Ramus intermedius branch was moderate in size and normal.  4. The circumflex was codominant and normal.  5. Right coronary  was codominant and normal.  6. Left ventriculography; RAO left ventriculogram was performed using      25 mL of Visipaque dye at 12 mL per second.  The overall LVEF was      estimated at greater than 60% with nonfocal wall motion      abnormalities.  7. Distal abdominal aortogram; performed using 20 mL of Visipaque dye      at 20 mL per second.  The renal arteries were widely patent.  Renal      abdominal aorta and iliac bifurcation were free of significant      atherosclerotic changes.   IMPRESSION:  [name] has essentially normal coronaries, normal left  ventricular function, and normal renal arteries.  I believe her Myoview  was false positive secondary to breast attenuation artifact and her high  blood pressure is essential.  The sheath was removed  and pressure  placed on the groin to achieve hemostasis.  The patient  left the lab in stable condition.  She will be discharged home later  today after remaining recumbent for 6 hours and will be seen back in the  office next week.  She is low risk for her operative procedure from  a  cardiovascular standpoint.      Nanetta Batty, M.D.  Electronically Signed     JB/MEDQ  D:  09/18/2006  T:  09/18/2006  Job:  [id number]   cc:   CHART  CARDIAC CATH LAB  [address] [place], [place], [place] [address] [facility]  Fleet Contras, M.D.

## 2011-01-15 NOTE — Op Note (Signed)
  NAMECESAR, ALF              ACCOUNT NO.:  [id number]  MEDICAL RECORD NO.:  [id number]           PATIENT TYPE:  O  LOCATION:  SDSC                         FACILITY:  [facility]  PHYSICIAN:  Adolph Pollack, M.D.DATE OF BIRTH:  1950/08/30  DATE OF PROCEDURE:  01/04/2011 DATE OF DISCHARGE:  01/04/2011                              OPERATIVE REPORT   PREOPERATIVE DIAGNOSIS:  Right nipple discharge.  POSTOPERATIVE DIAGNOSIS:  Right nipple discharge.  PROCEDURE:  Excision of right breast ducts and breast biopsy.  SURGEON:  Adolph Pollack, MD  ANESTHESIA:  General plus Marcaine local.  INDICATIONS:  Ms. Caracci is a 60 year old female who has been having nipple discharge, at one point it was bloody.  She underwent a mammogram and ultrasound and attempted ductogram but no obvious lesions were noted.  The discharge persists and now she presents for the above procedure.  TECHNIQUE:  She was seen in the holding area and the right breast marked with my initials.  She was then brought to the operating room, placed supine on the operating table and general anesthetic was administered. The right breast was sterilely prepped and draped.  I was able to elicit some clear to yellowish nipple discharge in the central duct area.  I made a curvilinear incision in the circumareolar area from about 10 o'clock to 2 o'clock position and began listing the nipple free from the underlying tissue.  I then identified multiple breast ducts going into the central area and I sharply cut through these freeing up the nipple and then I could see the area where the central duct was going up and causing the nipple discharge.  I left these ducts connected to the inferior breast tissue and used the electrocautery, performed excision of the ducts as well as a biopsy of the underlying tissue with some of which was firm.  This was all sent to the pathology.  Following this I controlled the bleeding with  electrocautery.  I then injected local anesthetic consisting of 0.5% plain Marcaine into the wound.  The subcutaneous tissue was then reapproximated with interrupted 3-0 Vicryl sutures and the skin was closed with a 4-0 Monocryl subcuticular stitch.  Steri-Strips and sterile dressings were applied.  She tolerated the procedure without any apparent complications and was taken to the recovery room in satisfactory condition.     Adolph Pollack, M.D.     Kari Baars  D:  01/04/2011  T:  01/04/2011  Job:  [id number]  cc:   Fleet Contras, M.D.  Electronically Signed by Avel Peace M.D. on 01/15/2011 10:49:12 AM

## 2011-01-21 ENCOUNTER — Other Ambulatory Visit: Payer: Self-pay | Admitting: General Surgery

## 2011-01-24 LAB — WOUND CULTURE
Gram Stain: NONE SEEN
Gram Stain: NONE SEEN

## 2011-02-18 ENCOUNTER — Encounter (INDEPENDENT_AMBULATORY_CARE_PROVIDER_SITE_OTHER): Payer: Medicaid Other | Admitting: General Surgery

## 2011-05-13 LAB — POCT I-STAT, CHEM 8
Calcium, Ion: 1.14
Chloride: 106
Glucose, Bld: 117 — ABNORMAL HIGH
HCT: 47 — ABNORMAL HIGH
Hemoglobin: 16 — ABNORMAL HIGH
TCO2: 28

## 2011-05-13 LAB — CK TOTAL AND CKMB (NOT AT ARMC)
CK, MB: 0.9
Relative Index: INVALID
Total CK: 64

## 2011-05-13 LAB — B-NATRIURETIC PEPTIDE (CONVERTED LAB): Pro B Natriuretic peptide (BNP): <30

## 2011-05-13 LAB — POCT CARDIAC MARKERS
CKMB, poc: <1 — ABNORMAL LOW
Myoglobin, poc: 49.6
Operator id: 277751

## 2011-05-14 LAB — CBC
HCT: 41.2
Hemoglobin: 13.6
RBC: 4.81
RDW: 13.6

## 2011-05-14 LAB — BASIC METABOLIC PANEL
CO2: 28
GFR calc non Af Amer: 34 — ABNORMAL LOW
Glucose, Bld: 118 — ABNORMAL HIGH
Potassium: 4.4
Sodium: 137

## 2011-05-20 LAB — URINALYSIS, ROUTINE W REFLEX MICROSCOPIC
Nitrite: NEGATIVE
Specific Gravity, Urine: 1.021
pH: 6.5

## 2011-05-20 LAB — CBC
HCT: 36.9
HCT: 40.6
HCT: 44
HCT: 42
Hemoglobin: 12.3
Hemoglobin: 14.4
MCHC: 32.5
MCHC: 32.7
MCHC: 33.1
MCHC: 33.3
MCHC: 32.2
MCV: 85.4
MCV: 86.6
MCV: 87
MCV: 86.2
Platelets: 148 — ABNORMAL LOW
Platelets: 162
Platelets: 165
Platelets: 236
RBC: 4.22
RDW: 13
RDW: 13.4
RDW: 14.4
WBC: 5.5

## 2011-05-20 LAB — BASIC METABOLIC PANEL
BUN: 10
BUN: 13
BUN: 6
CO2: 27
CO2: 28
CO2: 32
CO2: 32
Calcium: 7.9 — ABNORMAL LOW
Calcium: 8.1 — ABNORMAL LOW
Calcium: 9.1
Chloride: 101
Chloride: 104
Chloride: 95 — ABNORMAL LOW
Creatinine, Ser: 0.9
Creatinine, Ser: 1.23 — ABNORMAL HIGH
GFR calc Af Amer: >60
GFR calc non Af Amer: 46 — ABNORMAL LOW
Glucose, Bld: 102 — ABNORMAL HIGH
Glucose, Bld: 143 — ABNORMAL HIGH
Glucose, Bld: 150 — ABNORMAL HIGH
Potassium: 3.8
Potassium: 4.3
Sodium: 136

## 2011-05-20 LAB — POCT I-STAT, CHEM 8
Creatinine, Ser: 1.1
HCT: 43
Hemoglobin: 14.6
Potassium: 3.7
Sodium: 140
TCO2: 27

## 2011-05-20 LAB — DIFFERENTIAL
Basophils Absolute: 0
Basophils Relative: 1
Eosinophils Absolute: 0.2
Eosinophils Relative: 4
Lymphs Abs: 2.1
Neutrophils Relative %: 47

## 2011-05-20 LAB — URINE MICROSCOPIC-ADD ON

## 2011-05-20 LAB — URINE CULTURE

## 2011-05-20 LAB — PROTIME-INR
INR: 1.7 — ABNORMAL HIGH
INR: 2.6 — ABNORMAL HIGH
Prothrombin Time: 13.9
Prothrombin Time: 29.3 — ABNORMAL HIGH

## 2011-10-07 ENCOUNTER — Other Ambulatory Visit: Payer: Self-pay | Admitting: Orthopedic Surgery

## 2011-10-14 ENCOUNTER — Encounter (HOSPITAL_COMMUNITY): Payer: Self-pay | Admitting: Pharmacy Technician

## 2011-10-14 ENCOUNTER — Other Ambulatory Visit: Payer: Self-pay | Admitting: Orthopedic Surgery

## 2011-10-14 ENCOUNTER — Other Ambulatory Visit (HOSPITAL_COMMUNITY): Payer: Self-pay | Admitting: *Deleted

## 2011-10-15 ENCOUNTER — Encounter (HOSPITAL_COMMUNITY)
Admission: RE | Admit: 2011-10-15 | Discharge: 2011-10-15 | Disposition: A | Discharge status: Home / Self Care | Payer: Medicaid Other | Source: Ambulatory Visit | Attending: Orthopedic Surgery | Admitting: Orthopedic Surgery

## 2011-10-15 ENCOUNTER — Encounter (HOSPITAL_COMMUNITY): Payer: Self-pay

## 2011-10-15 HISTORY — DX: Unspecified osteoarthritis, unspecified site: M19.90

## 2011-10-15 HISTORY — DX: Anxiety disorder, unspecified: F41.9

## 2011-10-15 HISTORY — DX: Other complications of anesthesia, initial encounter: T88.59XA

## 2011-10-15 HISTORY — DX: Heart failure, unspecified: I50.9

## 2011-10-15 HISTORY — DX: Gastro-esophageal reflux disease without esophagitis: K21.9

## 2011-10-15 HISTORY — DX: Essential (primary) hypertension: I10

## 2011-10-15 HISTORY — DX: Adverse effect of unspecified anesthetic, initial encounter: T41.45XA

## 2011-10-15 HISTORY — DX: Irritable bowel syndrome without diarrhea: K58.9

## 2011-10-15 HISTORY — DX: Herpesviral infection, unspecified: B00.9

## 2011-10-15 LAB — CBC
Hemoglobin: 14.1 g/dL (ref 12.0–15.0)
MCH: 28.5 pg (ref 26.0–34.0)
MCHC: 32.5 g/dL (ref 30.0–36.0)
MCV: 87.7 fL (ref 78.0–100.0)
Platelets: 272 10*3/uL (ref 150–400)
RBC: 4.95 MIL/uL (ref 3.87–5.11)

## 2011-10-15 LAB — DIFFERENTIAL
Basophils Absolute: 0.1 10*3/uL (ref 0.0–0.1)
Lymphocytes Relative: 42 % (ref 12–46)
Lymphs Abs: 2.7 10*3/uL (ref 0.7–4.0)
Neutro Abs: 3 10*3/uL (ref 1.7–7.7)

## 2011-10-15 LAB — COMPREHENSIVE METABOLIC PANEL
CO2: 28 mEq/L (ref 19–32)
Calcium: 9.8 mg/dL (ref 8.4–10.5)
Creatinine, Ser: 1.02 mg/dL (ref 0.50–1.10)
GFR calc Af Amer: 67 mL/min — ABNORMAL LOW (ref >90)
GFR calc non Af Amer: 58 mL/min — ABNORMAL LOW (ref >90)
Glucose, Bld: 106 mg/dL — ABNORMAL HIGH (ref 70–99)
Total Protein: 7.6 g/dL (ref 6.0–8.3)

## 2011-10-15 LAB — ABO/RH: ABO/RH(D): B POS

## 2011-10-15 LAB — PROTIME-INR
INR: 1.01 (ref 0.00–1.49)
Prothrombin Time: 13.5 seconds (ref 11.6–15.2)

## 2011-10-15 LAB — URINALYSIS, ROUTINE W REFLEX MICROSCOPIC
Bilirubin Urine: NEGATIVE
Ketones, ur: NEGATIVE mg/dL
Protein, ur: NEGATIVE mg/dL
Urobilinogen, UA: 0.2 mg/dL (ref 0.0–1.0)

## 2011-10-15 LAB — URINE MICROSCOPIC-ADD ON

## 2011-10-15 LAB — TYPE AND SCREEN
ABO/RH(D): B POS
Antibody Screen: NEGATIVE

## 2011-10-15 LAB — SURGICAL PCR SCREEN
MRSA, PCR: NEGATIVE
Staphylococcus aureus: NEGATIVE

## 2011-10-15 MED ORDER — CHLORHEXIDINE GLUCONATE 4 % EX LIQD
60.0000 mL | Freq: Once | CUTANEOUS | Status: DC
  Filled 2011-10-15: qty 60

## 2011-10-15 NOTE — Pre-Procedure Instructions (Signed)
20 [name]   10/15/2011   Your procedure is scheduled on:  MARCH 5TH, Tuesday   Report to [facility] at  5:30 AM.  Call this number if you have problems the morning of surgery: [phone]   Remember:   Do not eat food:After Midnight   Monday .  May have clear liquids: up to 4 Hours before arrival time -- 1:30 AM.   Clear liquids include soda, tea, black coffee, apple or grape juice, broth.  Take these medicines the morning of surgery with A SIP OF WATER:  Celexa, prilosec, oxycodone,               Eye drops   Do not wear jewelry, make-up or nail polish.   Do not wear lotions, powders, or perfumes. You may wear deodorant.   Do not shave 48 hours prior to surgery.   Do not bring valuables to the hospital.   Contacts, dentures or bridgework may not be worn into surgery.  Leave suitcase in the car. After surgery it may be brought to your room.  For patients admitted to the hospital, checkout time is 11:00 AM the day of discharge.   Patients discharged the day of surgery will not be allowed to drive home.  Name and phone number of your driver:  SEAN  SMITH -  SON   Special Instructions: CHG Shower Use Special Wash: 1/2 bottle night before surgery and 1/2 bottle morning of surgery.   Please read over the following fact sheets that you were given: Pain Booklet, Blood Transfusion Information, MRSA Information and Surgical Site Infection Prevention

## 2011-10-15 NOTE — Progress Notes (Signed)
PT HAS H/O  CHF  WHICH SHE HASN'T HAD ANY PROBLEMS WITH FOR 5-6 YRS NOW... SEES DR. Nanetta Batty.Marland Kitchen LAST VISIT WAS WHEN SHE HAD SEEN HIM IN  12/2010  FOR CARDIAC CATH. NO PROBLEMS EXCEPT HER CURRENT SHOULDER ......... CATH RESULTS, EKG, CXR ALL FROM 12/2010 IN EPIC

## 2011-10-16 NOTE — Consult Note (Signed)
Anesthesiology chart review:  Chart reviewed.61 y/o female for R. Total shoulder arthroplasty 10/22/11. H/O htn, cardiac cath 09/18/06 showed normal coronaries and nl LV systolic function. Will evaluate [name] on the day of surgery.  Kipp Brood

## 2011-10-21 MED ORDER — CEFAZOLIN SODIUM-DEXTROSE 2-3 GM-% IV SOLR
2.0000 g | INTRAVENOUS | Status: AC
  Administered 2011-10-22: 2 g via INTRAVENOUS
  Filled 2011-10-21: qty 50

## 2011-10-22 ENCOUNTER — Ambulatory Visit (HOSPITAL_COMMUNITY): Payer: Medicaid Other

## 2011-10-22 ENCOUNTER — Inpatient Hospital Stay (HOSPITAL_COMMUNITY)
Admission: RE | Admit: 2011-10-22 | Discharge: 2011-10-24 | DRG: 483 | Disposition: A | Discharge status: Home Health | Payer: Medicaid Other | Source: Ambulatory Visit | Attending: Orthopedic Surgery | Admitting: Orthopedic Surgery

## 2011-10-22 ENCOUNTER — Ambulatory Visit (HOSPITAL_COMMUNITY): Payer: Medicaid Other | Admitting: Anesthesiology

## 2011-10-22 ENCOUNTER — Encounter (HOSPITAL_COMMUNITY): Payer: Self-pay | Admitting: Anesthesiology

## 2011-10-22 ENCOUNTER — Encounter (HOSPITAL_COMMUNITY)
Admission: RE | Disposition: A | Discharge status: Home Health | Payer: Self-pay | Source: Ambulatory Visit | Attending: Orthopedic Surgery

## 2011-10-22 DIAGNOSIS — M19019 Primary osteoarthritis, unspecified shoulder: Principal | ICD-10-CM | POA: Diagnosis present

## 2011-10-22 DIAGNOSIS — I10 Essential (primary) hypertension: Secondary | ICD-10-CM | POA: Diagnosis present

## 2011-10-22 DIAGNOSIS — N390 Urinary tract infection, site not specified: Secondary | ICD-10-CM | POA: Diagnosis present

## 2011-10-22 DIAGNOSIS — K589 Irritable bowel syndrome without diarrhea: Secondary | ICD-10-CM | POA: Diagnosis present

## 2011-10-22 DIAGNOSIS — Z01812 Encounter for preprocedural laboratory examination: Secondary | ICD-10-CM

## 2011-10-22 DIAGNOSIS — F411 Generalized anxiety disorder: Secondary | ICD-10-CM | POA: Diagnosis present

## 2011-10-22 DIAGNOSIS — Z96659 Presence of unspecified artificial knee joint: Secondary | ICD-10-CM

## 2011-10-22 DIAGNOSIS — I509 Heart failure, unspecified: Secondary | ICD-10-CM | POA: Diagnosis present

## 2011-10-22 DIAGNOSIS — K219 Gastro-esophageal reflux disease without esophagitis: Secondary | ICD-10-CM | POA: Diagnosis present

## 2011-10-22 HISTORY — PX: TOTAL SHOULDER ARTHROPLASTY: SHX126

## 2011-10-22 SURGERY — ARTHROPLASTY, SHOULDER, TOTAL
Anesthesia: General | Site: Shoulder | Laterality: Right | Wound class: Clean

## 2011-10-22 MED ORDER — GLYCOPYRROLATE 0.2 MG/ML IJ SOLN
INTRAMUSCULAR | Status: DC | PRN
  Administered 2011-10-22: 1 mg via INTRAVENOUS

## 2011-10-22 MED ORDER — TOBRAMYCIN-DEXAMETHASONE 0.3-0.1 % OP SUSP
1.0000 [drp] | Freq: Four times a day (QID) | OPHTHALMIC | Status: DC
  Administered 2011-10-22 – 2011-10-24 (×9): 1 [drp] via OPHTHALMIC
  Filled 2011-10-22 (×2): qty 2.5

## 2011-10-22 MED ORDER — OXYCODONE-ACETAMINOPHEN 5-325 MG PO TABS
1.0000 | ORAL_TABLET | ORAL | Status: DC | PRN
  Administered 2011-10-22 – 2011-10-23 (×3): 2 via ORAL
  Filled 2011-10-22 (×3): qty 2

## 2011-10-22 MED ORDER — VITAMIN D (ERGOCALCIFEROL) 1.25 MG (50000 UNIT) PO CAPS
50000.0000 [IU] | ORAL_CAPSULE | ORAL | Status: DC
  Filled 2011-10-22: qty 1

## 2011-10-22 MED ORDER — ACETAMINOPHEN 325 MG PO TABS: 650.0000 mg | ORAL_TABLET | Freq: Four times a day (QID) | ORAL | Status: DC | PRN

## 2011-10-22 MED ORDER — ONDANSETRON HCL 4 MG PO TABS: 4.0000 mg | ORAL_TABLET | Freq: Four times a day (QID) | ORAL | Status: DC | PRN

## 2011-10-22 MED ORDER — LACTATED RINGERS IV SOLN
INTRAVENOUS | Status: DC | PRN
  Administered 2011-10-22 (×2): via INTRAVENOUS

## 2011-10-22 MED ORDER — ROCURONIUM BROMIDE 100 MG/10ML IV SOLN
INTRAVENOUS | Status: DC | PRN
  Administered 2011-10-22: 50 mg via INTRAVENOUS

## 2011-10-22 MED ORDER — PHENYLEPHRINE HCL 10 MG/ML IJ SOLN
10.0000 mg | INTRAMUSCULAR | Status: DC | PRN
  Administered 2011-10-22: 5 ug/min via INTRAVENOUS

## 2011-10-22 MED ORDER — HETASTARCH-ELECTROLYTES 6 % IV SOLN
INTRAVENOUS | Status: DC | PRN
  Administered 2011-10-22: 09:00:00 via INTRAVENOUS

## 2011-10-22 MED ORDER — SODIUM CHLORIDE 0.9 % IV SOLN
INTRAVENOUS | Status: DC
  Administered 2011-10-23: 17:00:00 via INTRAVENOUS

## 2011-10-22 MED ORDER — LOSARTAN POTASSIUM-HCTZ 100-12.5 MG PO TABS: 1.0000 | ORAL_TABLET | Freq: Every day | ORAL | Status: DC

## 2011-10-22 MED ORDER — MENTHOL 3 MG MT LOZG
1.0000 | LOZENGE | OROMUCOSAL | Status: DC | PRN
  Filled 2011-10-22: qty 9

## 2011-10-22 MED ORDER — PANTOPRAZOLE SODIUM 40 MG PO TBEC
40.0000 mg | DELAYED_RELEASE_TABLET | Freq: Every day | ORAL | Status: DC
  Administered 2011-10-22 – 2011-10-24 (×3): 40 mg via ORAL
  Filled 2011-10-22 (×3): qty 1

## 2011-10-22 MED ORDER — WARFARIN - PHARMACIST DOSING INPATIENT
Freq: Every day | Status: DC
  Administered 2011-10-22: 18:00:00
  Filled 2011-10-22 (×4): qty 1

## 2011-10-22 MED ORDER — ALUM & MAG HYDROXIDE-SIMETH 200-200-20 MG/5ML PO SUSP: 30.0000 mL | ORAL | Status: DC | PRN

## 2011-10-22 MED ORDER — METHOCARBAMOL 750 MG PO TABS
750.0000 mg | ORAL_TABLET | Freq: Four times a day (QID) | ORAL | Status: DC | PRN
  Administered 2011-10-22 – 2011-10-24 (×3): 750 mg via ORAL
  Filled 2011-10-22 (×3): qty 1

## 2011-10-22 MED ORDER — HYDROCHLOROTHIAZIDE 25 MG PO TABS
12.5000 mg | ORAL_TABLET | Freq: Every day | ORAL | Status: DC
  Administered 2011-10-22: 12.5 mg via ORAL
  Filled 2011-10-22 (×2): qty 0.5

## 2011-10-22 MED ORDER — PROPOFOL 10 MG/ML IV EMUL
INTRAVENOUS | Status: DC | PRN
  Administered 2011-10-22: 140 mg via INTRAVENOUS

## 2011-10-22 MED ORDER — ONDANSETRON HCL 4 MG/2ML IJ SOLN
4.0000 mg | Freq: Four times a day (QID) | INTRAMUSCULAR | Status: DC | PRN
  Administered 2011-10-23: 4 mg via INTRAVENOUS
  Filled 2011-10-22: qty 2

## 2011-10-22 MED ORDER — WARFARIN SODIUM 6 MG PO TABS
6.0000 mg | ORAL_TABLET | Freq: Once | ORAL | Status: AC
  Administered 2011-10-22: 6 mg via ORAL
  Filled 2011-10-22: qty 1

## 2011-10-22 MED ORDER — HEMOSTATIC AGENTS (NO CHARGE) OPTIME
TOPICAL | Status: DC | PRN
  Administered 2011-10-22: 1 via TOPICAL

## 2011-10-22 MED ORDER — EPHEDRINE SULFATE 50 MG/ML IJ SOLN
INTRAMUSCULAR | Status: DC | PRN
  Administered 2011-10-22 (×2): 10 mg via INTRAVENOUS

## 2011-10-22 MED ORDER — DIPHENHYDRAMINE HCL 12.5 MG/5ML PO ELIX
12.5000 mg | ORAL_SOLUTION | ORAL | Status: DC | PRN
  Administered 2011-10-24: 25 mg via ORAL
  Filled 2011-10-22: qty 5

## 2011-10-22 MED ORDER — ONDANSETRON HCL 4 MG/2ML IJ SOLN: 4.0000 mg | Freq: Four times a day (QID) | INTRAMUSCULAR | Status: DC | PRN

## 2011-10-22 MED ORDER — WARFARIN VIDEO: Freq: Once | Status: DC

## 2011-10-22 MED ORDER — ONDANSETRON HCL 4 MG/2ML IJ SOLN
INTRAMUSCULAR | Status: DC | PRN
  Administered 2011-10-22: 4 mg via INTRAVENOUS

## 2011-10-22 MED ORDER — METOCLOPRAMIDE HCL 10 MG PO TABS: 5.0000 mg | ORAL_TABLET | Freq: Three times a day (TID) | ORAL | Status: DC | PRN

## 2011-10-22 MED ORDER — BUPIVACAINE-EPINEPHRINE PF 0.5-1:200000 % IJ SOLN
INTRAMUSCULAR | Status: DC | PRN
  Administered 2011-10-22: 30 mL

## 2011-10-22 MED ORDER — NEOSTIGMINE METHYLSULFATE 1 MG/ML IJ SOLN
INTRAMUSCULAR | Status: DC | PRN
  Administered 2011-10-22: 5 mg via INTRAVENOUS

## 2011-10-22 MED ORDER — FUROSEMIDE 80 MG PO TABS
80.0000 mg | ORAL_TABLET | Freq: Two times a day (BID) | ORAL | Status: DC
  Administered 2011-10-22 – 2011-10-24 (×3): 80 mg via ORAL
  Filled 2011-10-22 (×6): qty 1

## 2011-10-22 MED ORDER — MIDAZOLAM HCL 5 MG/5ML IJ SOLN
INTRAMUSCULAR | Status: DC | PRN
  Administered 2011-10-22: 2 mg via INTRAVENOUS

## 2011-10-22 MED ORDER — MORPHINE SULFATE 2 MG/ML IJ SOLN
2.0000 mg | INTRAMUSCULAR | Status: DC | PRN
  Administered 2011-10-22 – 2011-10-23 (×3): 2 mg via INTRAVENOUS
  Filled 2011-10-22 (×4): qty 1

## 2011-10-22 MED ORDER — POTASSIUM CHLORIDE CRYS ER 20 MEQ PO TBCR
20.0000 meq | EXTENDED_RELEASE_TABLET | Freq: Two times a day (BID) | ORAL | Status: DC
  Administered 2011-10-22 – 2011-10-24 (×5): 20 meq via ORAL
  Filled 2011-10-22 (×6): qty 1

## 2011-10-22 MED ORDER — HYDROMORPHONE HCL PF 1 MG/ML IJ SOLN: 0.2500 mg | INTRAMUSCULAR | Status: DC | PRN

## 2011-10-22 MED ORDER — FENTANYL CITRATE 0.05 MG/ML IJ SOLN
INTRAMUSCULAR | Status: DC | PRN
  Administered 2011-10-22 (×2): 50 ug via INTRAVENOUS

## 2011-10-22 MED ORDER — DOCUSATE SODIUM 100 MG PO CAPS
100.0000 mg | ORAL_CAPSULE | Freq: Two times a day (BID) | ORAL | Status: DC
  Administered 2011-10-22 – 2011-10-24 (×5): 100 mg via ORAL
  Filled 2011-10-22 (×6): qty 1

## 2011-10-22 MED ORDER — ACETAMINOPHEN 650 MG RE SUPP: 650.0000 mg | Freq: Four times a day (QID) | RECTAL | Status: DC | PRN

## 2011-10-22 MED ORDER — CITALOPRAM HYDROBROMIDE 20 MG PO TABS
20.0000 mg | ORAL_TABLET | Freq: Every day | ORAL | Status: DC
  Administered 2011-10-23 – 2011-10-24 (×2): 20 mg via ORAL
  Filled 2011-10-22 (×3): qty 1

## 2011-10-22 MED ORDER — COUMADIN BOOK
Freq: Once | Status: AC
  Administered 2011-10-22: 14:00:00
  Filled 2011-10-22: qty 1

## 2011-10-22 MED ORDER — SIMVASTATIN 20 MG PO TABS
20.0000 mg | ORAL_TABLET | Freq: Every evening | ORAL | Status: DC
  Administered 2011-10-22 – 2011-10-23 (×2): 20 mg via ORAL
  Filled 2011-10-22 (×3): qty 1

## 2011-10-22 MED ORDER — CIPROFLOXACIN HCL 250 MG PO TABS
250.0000 mg | ORAL_TABLET | Freq: Two times a day (BID) | ORAL | Status: DC
  Administered 2011-10-22 – 2011-10-24 (×4): 250 mg via ORAL
  Filled 2011-10-22 (×9): qty 1

## 2011-10-22 MED ORDER — METOCLOPRAMIDE HCL 5 MG/ML IJ SOLN
5.0000 mg | Freq: Three times a day (TID) | INTRAMUSCULAR | Status: DC | PRN
  Administered 2011-10-23: 10 mg via INTRAVENOUS
  Filled 2011-10-22: qty 2

## 2011-10-22 MED ORDER — LOSARTAN POTASSIUM 50 MG PO TABS
100.0000 mg | ORAL_TABLET | Freq: Every day | ORAL | Status: DC
  Administered 2011-10-22 – 2011-10-24 (×3): 100 mg via ORAL
  Filled 2011-10-22 (×3): qty 2

## 2011-10-22 MED ORDER — SODIUM CHLORIDE 0.9 % IR SOLN
Status: DC | PRN
  Administered 2011-10-22: 3000 mL

## 2011-10-22 MED ORDER — PHENOL 1.4 % MT LIQD: 1.0000 | OROMUCOSAL | Status: DC | PRN

## 2011-10-22 SURGICAL SUPPLY — 67 items
BLADE SAW SAG 73X25 THK (BLADE) ×1
BLADE SAW SGTL 73X25 THK (BLADE) ×1 IMPLANT
BLADE SURG 15 STRL LF DISP TIS (BLADE) ×1 IMPLANT
BLADE SURG 15 STRL SS (BLADE) ×2
BOWL SMART MIX CTS (DISPOSABLE) IMPLANT
CEMENT BONE DEPUY (Cement) ×2 IMPLANT
CHLORAPREP W/TINT 26ML (MISCELLANEOUS) ×2 IMPLANT
CLOTH BEACON ORANGE TIMEOUT ST (SAFETY) ×2 IMPLANT
COVER SURGICAL LIGHT HANDLE (MISCELLANEOUS) ×2 IMPLANT
DRAPE INCISE IOBAN 66X45 STRL (DRAPES) ×2 IMPLANT
DRAPE SURG 17X23 STRL (DRAPES) ×1 IMPLANT
DRAPE U-SHAPE 47X51 STRL (DRAPES) ×2 IMPLANT
DRSG ADAPTIC 3X8 NADH LF (GAUZE/BANDAGES/DRESSINGS) ×2 IMPLANT
DRSG PAD ABDOMINAL 8X10 ST (GAUZE/BANDAGES/DRESSINGS) ×4 IMPLANT
ELECT CAUTERY BLADE 6.4 (BLADE) ×1 IMPLANT
ELECT REM PT RETURN 9FT ADLT (ELECTROSURGICAL) ×2
ELECTRODE REM PT RTRN 9FT ADLT (ELECTROSURGICAL) ×1 IMPLANT
EVACUATOR 1/8 PVC DRAIN (DRAIN) ×1 IMPLANT
GLOVE BIO SURGEON STRL SZ7.5 (GLOVE) ×1 IMPLANT
GLOVE BIOGEL PI IND STRL 8 (GLOVE) ×1 IMPLANT
GLOVE BIOGEL PI INDICATOR 8 (GLOVE)
GOWN PREVENTION PLUS XLARGE (GOWN DISPOSABLE) ×3 IMPLANT
GOWN STRL NON-REIN LRG LVL3 (GOWN DISPOSABLE) ×2 IMPLANT
HANDPIECE INTERPULSE COAX TIP (DISPOSABLE) ×2
HEMOSTAT SURGICEL 2X14 (HEMOSTASIS) ×2 IMPLANT
HOOD PEEL AWAY FACE SHEILD DIS (HOOD) ×4 IMPLANT
KIT BASIN OR (CUSTOM PROCEDURE TRAY) ×2 IMPLANT
KIT ROOM TURNOVER OR (KITS) ×2 IMPLANT
MANIFOLD NEPTUNE II (INSTRUMENTS) ×2 IMPLANT
NDL HYPO 25GX1X1/2 BEV (NEEDLE) ×1 IMPLANT
NDL MAYO TROCAR (NEEDLE) ×1 IMPLANT
NEEDLE HYPO 25GX1X1/2 BEV (NEEDLE) ×2 IMPLANT
NEEDLE MAYO TROCAR (NEEDLE) ×2 IMPLANT
NS IRRIG 1000ML POUR BTL (IV SOLUTION) ×2 IMPLANT
PACK SHOULDER (CUSTOM PROCEDURE TRAY) ×2 IMPLANT
PAD ARMBOARD 7.5X6 YLW CONV (MISCELLANEOUS) ×4 IMPLANT
PIN METAGLENE 2.5 (PIN) IMPLANT
RETRIEVER SUT HEWSON (MISCELLANEOUS) IMPLANT
SAGITTAL BLADE NARROW THIN EXTRA SHORT ×1 IMPLANT
SET HNDPC FAN SPRY TIP SCT (DISPOSABLE) ×1 IMPLANT
SLING ARM IMMOBILIZER LRG (SOFTGOODS) ×2 IMPLANT
SLING ARM IMMOBILIZER MED (SOFTGOODS) IMPLANT
SMARTMIX MINI TOWER (MISCELLANEOUS) ×2
SPONGE GAUZE 4X4 12PLY (GAUZE/BANDAGES/DRESSINGS) ×2 IMPLANT
SPONGE LAP 18X18 X RAY DECT (DISPOSABLE) ×2 IMPLANT
SPONGE LAP 4X18 X RAY DECT (DISPOSABLE) ×4 IMPLANT
STRIP CLOSURE SKIN 1/2X4 (GAUZE/BANDAGES/DRESSINGS) ×2 IMPLANT
SUCTION FRAZIER TIP 10 FR DISP (SUCTIONS) ×2 IMPLANT
SUPPORT WRAP ARM LG (MISCELLANEOUS) ×3 IMPLANT
SUT ETHIBOND 2 OS 4 DA (SUTURE) ×1 IMPLANT
SUT ETHIBOND NAB CT1 #1 30IN (SUTURE) ×2 IMPLANT
SUT FIBERWIRE #2 38 T-5 BLUE (SUTURE) ×8
SUT MNCRL AB 4-0 PS2 18 (SUTURE) ×2 IMPLANT
SUT SILK 2 0 TIES 17X18 (SUTURE) ×2
SUT SILK 2-0 18XBRD TIE BLK (SUTURE) ×1 IMPLANT
SUT VIC AB 0 CTB1 27 (SUTURE) ×3 IMPLANT
SUT VIC AB 2-0 CT1 27 (SUTURE) ×2
SUT VIC AB 2-0 CT1 TAPERPNT 27 (SUTURE) ×3 IMPLANT
SUTURE FIBERWR #2 38 T-5 BLUE (SUTURE) ×5 IMPLANT
SYR CONTROL 10ML LL (SYRINGE) ×2 IMPLANT
SYRINGE TOOMEY DISP (SYRINGE) ×2 IMPLANT
TOWEL OR 17X24 6PK STRL BLUE (TOWEL DISPOSABLE) ×2 IMPLANT
TOWEL OR 17X26 10 PK STRL BLUE (TOWEL DISPOSABLE) ×2 IMPLANT
TOWER SMARTMIX MINI (MISCELLANEOUS) ×1 IMPLANT
TRAY FOLEY CATH 14FR (SET/KITS/TRAYS/PACK) ×1 IMPLANT
WATER STERILE IRR 1000ML POUR (IV SOLUTION) ×2 IMPLANT
YANKAUER SUCT BULB TIP NO VENT (SUCTIONS) ×2 IMPLANT

## 2011-10-22 NOTE — Transfer of Care (Signed)
Immediate Anesthesia Transfer of Care Note  Patient: [name]  Procedure(s) Performed: Procedure(s) (LRB): TOTAL SHOULDER ARTHROPLASTY (Right)  Patient Location: PACU  Anesthesia Type: General and Regional  Level of Consciousness: awake and alert   Airway & Oxygen Therapy: Patient Spontanous Breathing and Patient connected to face mask oxygen  Post-op Assessment: Report given to PACU RN and Post -op Vital signs reviewed and stable  Post vital signs: Reviewed and stable  Complications: No apparent anesthesia complications

## 2011-10-22 NOTE — Anesthesia Postprocedure Evaluation (Signed)
Anesthesia Post Note  Patient: [name]  Procedure(s) Performed: Procedure(s) (LRB): TOTAL SHOULDER ARTHROPLASTY (Right)  Anesthesia type: General  Patient location: PACU  Post pain: Pain level controlled and Adequate analgesia  Post assessment: Post-op Vital signs reviewed, Patient's Cardiovascular Status Stable, Respiratory Function Stable, Patent Airway and Pain level controlled  Last Vitals:  Filed Vitals:   10/22/11 1222  BP: 121/74  Pulse: 86  Temp: 35.9 C  Resp: 18    Post vital signs: Reviewed and stable  Level of consciousness: awake, alert  and oriented  Complications: No apparent anesthesia complications

## 2011-10-22 NOTE — Progress Notes (Signed)
Report given to elise rn as caregiver 

## 2011-10-22 NOTE — Progress Notes (Signed)
ANTICOAGULATION CONSULT NOTE - Initial Consult  Pharmacy Consult for Coumadin Indication: VTE prophylaxis  Allergies  Allergen Reactions  . Aspirin     Stomach bleeding  . Betadine (Povidone Iodine)   . Codeine Itching  . Iodine   . Ivp Dye (Iodinated Diagnostic Agents)   . Sulfa Antibiotics Hives and Itching    Patient Measurements:   Heparin Dosing Weight: 122.4 kg  Vital Signs: Temp: 96.7 F (35.9 C) (03/05 1222) Temp src: Oral (03/05 0615) BP: 121/74 mmHg (03/05 1222) Pulse Rate: 86  (03/05 1222)  Labs: No results found for this basename: HGB:2,HCT:3,PLT:3,APTT:3,LABPROT:3,INR:3,HEPARINUNFRC:3,CREATININE:3,CKTOTAL:3,CKMB:3,TROPONINI:3 in the last 72 hours CrCl is unknown because there is no height on file for the current visit.  Medical History: Past Medical History  Diagnosis Date  . Complication of anesthesia     @ [place].....COULDN'T GET HER AWAKE....FOR  HERNIA SURGERY... PLACED ON VENTILATOR  . CHF (congestive heart failure)     [name].......LAST OFFICE VISIT WAS A FEW AGO  . Hypertension   . Bronchitis     USES NEB  AS NEEDED  . GERD (gastroesophageal reflux disease)     TAKES PRILOSEC BID  . Arthritis   . Anxiety     ANXIETY ONCE....PLACED ON CELEXA  . 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 IBS (irritable bowel syndrome)     Medications:  Prescriptions prior to admission  Medication Sig Dispense Refill  . citalopram (CELEXA) 20 MG tablet Take 20 mg by mouth daily.      . diclofenac (VOLTAREN) 75 MG EC tablet Take 75 mg by mouth daily.      . furosemide (LASIX) 80 MG tablet Take 80 mg by mouth 2 (two) times daily.      Marland Kitchen losartan-hydrochlorothiazide (HYZAAR) 100-12.5 MG per tablet Take 1 tablet by mouth daily.      . methocarbamol (ROBAXIN) 750 MG tablet Take 750 mg by mouth 4 (four) times daily as needed. For spasms      . omeprazole (PRILOSEC) 20 MG capsule Take 20 mg by mouth 2 (two) times daily.      Marland Kitchen  oxyCODONE-acetaminophen (PERCOCET) 5-325 MG per tablet Take 1 tablet by mouth every 6 (six) hours as needed. For pain      . potassium chloride SA (K-DUR,KLOR-CON) 20 MEQ tablet Take 20 mEq by mouth 2 (two) times daily.      . simvastatin (ZOCOR) 20 MG tablet Take 20 mg by mouth every evening.      . tobramycin-dexamethasone (TOBRADEX) ophthalmic solution Place 1 drop into both eyes 4 (four) times daily. For eye infection      . Vitamin D, Ergocalciferol, (DRISDOL) 50000 UNITS CAPS Take 50,000 Units by mouth every 7 (seven) days. On Wednesday        Assessment: End stage R shoulder arthritis.   Anticoag: VTE prophx. S/p shoulder surgery. Baseline INR 1.01   ID: Afebrile. WBC 6.4Patient started on Cipro for ?  Cards: HTN, CHF. Meds: po Lasix, HCTZ, losartan,K,simvastatin  GI/Nutrition: GERD, IBS, hernia repair, h/o GIB on ASA. Meds: po PPI.  Neuro: Anxiety: Celexa  Goal of Therapy:  1.5-2 x 2 weeks (see consult request 10/22/11)   Plan:  Coumadin 6mg  po x 1 today. Initiate education with book/video Daily PT/INR  Merilynn Finland, [employer] 10/22/2011,1:28 PM

## 2011-10-22 NOTE — Anesthesia Procedure Notes (Addendum)
Anesthesia Regional Block:  Interscalene brachial plexus block  Pre-Anesthetic Checklist: ,, timeout performed, Correct Patient, Correct Site, Correct Laterality, Correct Procedure, Correct Position, site marked, Risks and benefits discussed,  Surgical consent,  Pre-op evaluation,  At surgeon's request and post-op pain management  Laterality: Right  Prep: chloraprep       Needles:  Injection technique: Single-shot  Needle Type: Echogenic Stimulator Needle     Needle Length: 5cm 5 cm Needle Gauge: 22 and 22 G    Additional Needles:  Procedures: ultrasound guided and nerve stimulator Interscalene brachial plexus block  Nerve Stimulator or Paresthesia:  Response: biceps flexion, 0.45 mA,   Additional Responses:   Narrative:  Start time: 10/22/2011 7:00 AM End time: 10/22/2011 7:15 AM Injection made incrementally with aspirations every 5 mL.  Performed by: Personally  Anesthesiologist: Dr Chaney Malling  Additional Notes: Functioning IV was confirmed and monitors were applied.  A 50mm 22ga Arrow echogenic stimulator needle was used. Sterile prep and drape,hand hygiene and sterile gloves were used.  Negative aspiration and negative test dose prior to incremental administration of local anesthetic. The patient tolerated the procedure well.  Ultrasound guidance: relevent anatomy identified, needle position confirmed, local anesthetic spread visualized around nerve(s), vascular puncture avoided.  Image printed for medical record.   Interscalene brachial plexus block Procedure Name: Intubation Date/Time: 10/22/2011 7:47 AM Performed by: Rossie Muskrat Pre-anesthesia Checklist: Patient identified, Timeout performed, Emergency Drugs available, Suction available and Patient being monitored Patient Re-evaluated:Patient Re-evaluated prior to inductionOxygen Delivery Method: Circle system utilized Preoxygenation: Pre-oxygenation with 100% oxygen Intubation Type: IV induction Ventilation: Mask  ventilation without difficulty Laryngoscope Size: Miller and 2 Grade View: Grade I Tube type: Oral Tube size: 7.5 mm Number of attempts: 1 Airway Equipment and Method: Stylet Placement Confirmation: ETT inserted through vocal cords under direct vision,  breath sounds checked- equal and bilateral and positive ETCO2 Secured at: 21 cm Tube secured with: Tape Dental Injury: Teeth and Oropharynx as per pre-operative assessment
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
## 2011-10-22 NOTE — Anesthesia Preprocedure Evaluation (Addendum)
Anesthesia Evaluation  Patient identified by MRN, date of birth, ID band Patient awake    Reviewed: Allergy & Precautions, H&P , NPO status , Patient's Chart, lab work & pertinent test results  Airway Mallampati: II  Neck ROM: full    Dental   Pulmonary          Cardiovascular hypertension, Pt. on medications +CHF     Neuro/Psych Anxiety    GI/Hepatic GERD-  Controlled,  Endo/Other  Morbid obesity  Renal/GU      Musculoskeletal   Abdominal   Peds  Hematology   Anesthesia Other Findings   Reproductive/Obstetrics                          Anesthesia Physical Anesthesia Plan  ASA: III  Anesthesia Plan: General and Regional   Post-op Pain Management: MAC Combined w/ Regional for Post-op pain   Induction: Intravenous  Airway Management Planned: Oral ETT  Additional Equipment:   Intra-op Plan:   Post-operative Plan: Extubation in OR  Informed Consent: I have reviewed the patients History and Physical, chart, labs and discussed the procedure including the risks, benefits and alternatives for the proposed anesthesia with the patient or authorized representative who has indicated his/her understanding and acceptance.     Plan Discussed with: CRNA and Surgeon  Anesthesia Plan Comments:         Anesthesia Quick Evaluation

## 2011-10-22 NOTE — Preoperative (Signed)
Beta Blockers   Reason not to administer Beta Blockers:Pt does not take B Blocker 

## 2011-10-22 NOTE — Op Note (Signed)
Procedure(s): TOTAL SHOULDER ARTHROPLASTY Procedure Note  [name] female 61 y.o. 10/22/2011  Procedure(s) and Anesthesia Type:    * TOTAL SHOULDER ARTHROPLASTY - General  Surgeon(s) and Role:    * Mable Paris, MD - Primary   Indications:  61 y.o. female  With endstage right shoulder arthritis. Pain and dysfunction interfered with quality of life and nonoperative treatment with activity modification, NSAIDS and injections failed.     Surgeon: Mable Paris   Assistants: Skip Mayer PA-C  Anesthesia: General endotracheal anesthesia and  regional    Procedure Detail  TOTAL SHOULDER ARTHROPLASTY  Findings: Right DePuy APG total shoulder replacement with size 8 press-fit proximally porous coated stem, size 44 anchor peg glenoid cemented, size 44x15 eccentric head  Estimated Blood Loss:  200 mL         Drains: 1 medium hemovac  Blood Given: none          Specimens: none        Complications:  * No complications entered in OR log *         Disposition: PACU - hemodynamically stable.         Condition: stable    Procedure:   The patient was identified in the preoperative holding area where I personally marked the operative extremity after verifying with the patient and consent. She  was taken to the operating room where She was transferred to the   operative table.  The patient received an interscalene block in   the holding area by the attending anesthesiologist.  General anesthesia was induced   in the operating room without complication.  The patient did receive IV  Ancef prior to the commencement of the procedure.  The patient was   placed in the beach-chair position with the back raised about 30   degrees.  The nonoperative extremity and head and neck were carefully   positioned and padded protecting against neurovascular compromise.  The   left upper extremity was then prepped and draped in the standard sterile   fashion.    The  appropriate operative time-out was performed with   Anesthesia, the perioperative staff, as well as myself and we all agreed   that the right side was the correct operative site.  An approximately   10 cm incision was made from the tip of the coracoid to the center point of the   humerus at the level of the axilla.  Dissection was carried down sharply   through subcutaneous tissues and cephalic vein was identified and taken   laterally with the deltoid.  The pectoralis major was taken medially.  The   upper 1 cm of the pectoralis major was released from its attachment on   the humerus.  The clavipectoral fascia was incised just lateral to the   conjoined tendon.  This incision was carried up to but not into the   coracoacromial ligament.  Digital palpation was used to prove   integrity of the axillary nerve which was protected throughout the   procedure.  Musculocutaneous nerve was not palpated in the operative   field.  Conjoined tendon was then retracted gently medially and the   deltoid laterally.  Anterior circumflex humeral vessels were clamped and   coagulated.  The soft tissues overlying the biceps was incised and this   incision was carried across the transverse humeral ligament to the base   of the coracoid.  The biceps was tenodesed to the soft tissue just above  pectoralis major and the remaining portion of the biceps superiorly was   excised.  An osteotomy was performed at the lesser tuberosity and the   subscapularis was freed from the underlying capsule.  Capsule was then   released all the way down to the 6 o'clock position of the humeral head.   The humeral head was then delivered with simultaneous adduction,   extension and external rotation.  All humeral osteophytes were removed   and the anatomic neck of the humerus was marked and cut free hand at   approximately 25 degrees retroversion within about 3 mm of the cuff   reflection posteriorly.  The head size was estimated  to be a 44 x 15 medium   offset.  At that point, the humeral head was retracted posteriorly with   a Fukuda retractor and the anterior-inferior capsule was excised.   Remaining portion of the capsule was released at the base of the   coracoid.  The remaining biceps anchor and the entire anterior-inferior   labrum was excised.  The posterior labrum was also excised but the   posterior capsule was not released.  The guidepin was placed bicortically with 0 elevated guide.  The reamer was used to ream to concentric bone with punctate bleeding.  This gave an excellent concentric surface.  The center hole was then drilled for an anchor peg glenoid followed by the three peripheral holes and none of the holes   exited the glenoid wall.  I then pulse irrigated these holes and dried   them with Surgicel and thrombin.  The three peripheral holes were then   pressurized cemented and the anchor peg glenoid was placed and impacted   with an excellent fit.  The glenoid was a 44 component.  The proximal humerus was then again exposed taking care not to displace the glenoid.    The humerus was then sequentially reamed going from 6 to 8 by 2 mm incriments. The 8 mm reamer was found to have appropriate cortical contact.  A   box osteotome was then used and a 8-mm broach.  The broach handle was   removed and the trial head was placed.   Calcar reamer was used.The eccentric 44 x 15 head fit best.  With the trial implantation of the component, there was   approximately 50% posterior translation with immediate snap back to the   anatomic position.  With forward elevation, there was no tendency   towards posterior subluxation.   The trial was removed and the final implant was prepared on a back table.  The implant was then impacted and   achieved excellent anatomic reconstruction of the proximal humerus.  #2 Fiberwire was placed around the neck prior to impaction.  The joint   was then copiously irrigated with pulse  lavage.  The subscapularis and   lesser tuberosity osteotomy were then repaired using 2 #2 FiberWires   and the #2 FiberWire around the neck of the implant in a double row type   repair.  One #2 Ethibond was placed at the rotator interval just above   the lesser tuberosity.  After repair of the lesser tuberosity, a medium   Hemovac was placed out anterolaterally and again copious irrigation was   used.    The subcutaneous tissue was undermined over the deltoid up to the area of the a.c. joint. The area of the a.c. joint was identified there was noted to be exposed bone dorsally with no deltotrapezial fascia  over the area of the a.c. joint. Exposed this bone and the a.c. joint was noted to be completely fused dorsally. At this point I determined that in order to resect the distal clavicle I would need to osteotomize this point and would need to take off the anterior deltoid to do so. If this were done there would be no good tissue for closure over the resection. I felt that overall this would be a bad choice for this patient and my hope is a super pain will be alleviated with a total shoulder replacement especially since the a.c. joint is now completely fused. The wound was again copiously irrigated with pulse lavage.  Skin was closed with 2-0 Vicryl sutures in the deep dermal layer and staples for skin closure.  Sterile dressings were then applied including Adaptic, 4x4s, ABDs and tape.  The patient was placed in a sling and allowed to awaken from general anesthesia and taken to the recovery room in stable  condition.      POSTOPERATIVE PLAN:  Early passive range of motion will be allowed with the goal of 40 degrees external rotation and a 140 degrees forward elevation.  No internal rotation at this time.  No active motion of the arm until the lesser tuberosity heels.  The patient will likely be kept in the hospital for 2 days and then discharged home. She is allergic to aspirin. Given her obesity she is  likely at higher risk for the VTE and therefore I will treat her with low-dose Coumadin for the next 2 weeks. She will also have SCDs while in house.

## 2011-10-23 LAB — BASIC METABOLIC PANEL
BUN: 5 mg/dL — ABNORMAL LOW (ref 6–23)
CO2: 28 mEq/L (ref 19–32)
Calcium: 8.2 mg/dL — ABNORMAL LOW (ref 8.4–10.5)
GFR calc non Af Amer: 57 mL/min — ABNORMAL LOW (ref >90)
Glucose, Bld: 133 mg/dL — ABNORMAL HIGH (ref 70–99)

## 2011-10-23 LAB — CBC
HCT: 34 % — ABNORMAL LOW (ref 36.0–46.0)
Hemoglobin: 11.2 g/dL — ABNORMAL LOW (ref 12.0–15.0)
MCH: 28.6 pg (ref 26.0–34.0)
MCHC: 32.9 g/dL (ref 30.0–36.0)
MCV: 87 fL (ref 78.0–100.0)
RBC: 3.91 MIL/uL (ref 3.87–5.11)

## 2011-10-23 LAB — PROTIME-INR: Prothrombin Time: 14.7 seconds (ref 11.6–15.2)

## 2011-10-23 MED ORDER — HYDROCHLOROTHIAZIDE 12.5 MG PO CAPS
12.5000 mg | ORAL_CAPSULE | Freq: Every day | ORAL | Status: DC
  Administered 2011-10-24: 12.5 mg via ORAL
  Filled 2011-10-23: qty 1

## 2011-10-23 MED ORDER — WARFARIN SODIUM 6 MG PO TABS
6.0000 mg | ORAL_TABLET | Freq: Once | ORAL | Status: AC
  Administered 2011-10-23: 6 mg via ORAL
  Filled 2011-10-23: qty 1

## 2011-10-23 NOTE — Progress Notes (Signed)
CARE MANAGEMENT NOTE 10/23/2011    Action/Plan:   Discharge planning. Spoke with pt. She uses a cane and /or a walker at home, states she will use can for now. will wait for PT/OT eval   Anticipated DC Date:  10/24/2011   Anticipated DC Plan:           Choice offered to / List presented to:     DME arranged  NA           Status of service:  In process, will continue to follow

## 2011-10-23 NOTE — Progress Notes (Signed)
OT Note  Attempted to see pt this am. Pt nauseated. Will return this pm. [facility], OTR/L  [phone] 10/23/2011
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
## 2011-10-23 NOTE — Progress Notes (Signed)
ANTICOAGULATION CONSULT NOTE - Initial Consult  Pharmacy Consult for Coumadin Indication: VTE prophylaxis  Allergies  Allergen Reactions  . Aspirin     Stomach bleeding  . Betadine (Povidone Iodine)   . Codeine Itching  . Iodine   . Ivp Dye (Iodinated Diagnostic Agents)   . Sulfa Antibiotics Hives and Itching    Patient Measurements: Height: 5\' 3"  (160 cm) Weight: 269 lb 13.5 oz (122.4 kg) IBW/kg (Calculated) : 52.4  Heparin Dosing Weight: 122.4 kg  Vital Signs: Temp: 98.7 F (37.1 C) (03/06 0508) BP: 111/45 mmHg (03/06 0508) Pulse Rate: 85  (03/06 0508)  Labs:  Basename 10/23/11 0555  HGB 11.2*  HCT 34.0*  PLT 197  APTT --  LABPROT 14.7  INR 1.13  HEPARINUNFRC --  CREATININE 1.03  CKTOTAL --  CKMB --  TROPONINI --   Estimated Creatinine Clearance: 71.9 ml/min (by C-G formula based on Cr of 1.03).  Medical History: Past Medical History  Diagnosis Date  . Complication of anesthesia     @ [place].....COULDN'T GET HER AWAKE....FOR  HERNIA SURGERY... PLACED ON VENTILATOR  . CHF (congestive heart failure)     [name].......LAST OFFICE VISIT WAS A FEW AGO  . Hypertension   . Bronchitis     USES NEB  AS NEEDED  . GERD (gastroesophageal reflux disease)     TAKES PRILOSEC BID  . Arthritis   . Anxiety     ANXIETY ONCE....PLACED ON CELEXA  . 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 IBS (irritable bowel syndrome)     Medications:  Prescriptions prior to admission  Medication Sig Dispense Refill  . citalopram (CELEXA) 20 MG tablet Take 20 mg by mouth daily.      . diclofenac (VOLTAREN) 75 MG EC tablet Take 75 mg by mouth daily.      . furosemide (LASIX) 80 MG tablet Take 80 mg by mouth 2 (two) times daily.      Marland Kitchen losartan-hydrochlorothiazide (HYZAAR) 100-12.5 MG per tablet Take 1 tablet by mouth daily.      . methocarbamol (ROBAXIN) 750 MG tablet Take 750 mg by mouth 4 (four) times daily as needed. For spasms      . omeprazole  (PRILOSEC) 20 MG capsule Take 20 mg by mouth 2 (two) times daily.      Marland Kitchen oxyCODONE-acetaminophen (PERCOCET) 5-325 MG per tablet Take 1 tablet by mouth every 6 (six) hours as needed. For pain      . potassium chloride SA (K-DUR,KLOR-CON) 20 MEQ tablet Take 20 mEq by mouth 2 (two) times daily.      . simvastatin (ZOCOR) 20 MG tablet Take 20 mg by mouth every evening.      . tobramycin-dexamethasone (TOBRADEX) ophthalmic solution Place 1 drop into both eyes 4 (four) times daily. For eye infection      . Vitamin D, Ergocalciferol, (DRISDOL) 50000 UNITS CAPS Take 50,000 Units by mouth every 7 (seven) days. On Wednesday        Assessment: End stage R shoulder arthritis.   Anticoag: VTE prophx. S/p shoulder surgery. Baseline INR 1.01. INR 1.13 today.  ID:  WBC 6.3. Patient started on Cipro for UTI per MD note.  Cards: HTN, CHF. Max BP 177/100 with HR 75-100 yestday.  Meds: po Lasix, HCTZ, losartan,K,simvastatin.   I/Nutrition: GERD, IBS, hernia repair, h/o GIB on ASA. Meds: po PPI.  Neuro: Anxiety: Celexa. Having issues with pain after block wore off.  Goal of Therapy:  1.5-2 x 2 weeks (see consult request 10/22/11)   Plan:  Coumadin 6mg  po x 1 again today. Initiate education with book/video Daily PT/INR  Pasty Spillers 10/23/2011,9:34 AM

## 2011-10-23 NOTE — Progress Notes (Signed)
PATIENT ID: [name]  MRN: [id number]  DOB/AGE:  March 23, 1951 / 61 y.o.  1 Day Post-Op Procedure(s) (LRB): TOTAL SHOULDER ARTHROPLASTY (Right)  Subjective: Pain is moderate.  Block wore off over night.  No c/o chest pain or SOB.      Objective: Vital signs in last 24 hours: Temp:  [96.6 F (35.9 C)-100.2 F (37.9 C)] 98.7 F (37.1 C) (03/06 0508) Pulse Rate:  [75-100] 85  (03/06 0508) Resp:  [18-23] 18  (03/06 0508) BP: (105-177)/(45-100) 111/45 mmHg (03/06 0508) SpO2:  [96 %-100 %] 97 % (03/06 0508) Weight:  [122.4 kg (269 lb 13.5 oz)] 122.4 kg (269 lb 13.5 oz) (03/05 1430)  Intake/Output from previous day: 03/05 0701 - 03/06 0700 In: 3590 [P.O.:840; I.V.:2250; IV Piggyback:500] Out: 3135 [Urine:3000; Drains:35; Blood:100] Intake/Output this shift: Total I/O In: -  Out: 1875 [Urine:1850; Drains:25]    No results found for this basename: HGB:5 in the last 72 hours No results found for this basename: WBC:2,RBC:2,HCT:2,PLT:2 in the last 72 hours No results found for this basename: NA:2,K:2,CL:2,CO2:2,BUN:2,CREATININE:2,GLUCOSE:2,CALCIUM:2 in the last 72 hours No results found for this basename: LABPT:2,INR:2 in the last 72 hours  Physical Exam: Neurovascular intact Sensation intact distally Incision: dressing C/D/I Compartment soft Drain d/c'd  Assessment/Plan: 1 Day Post-Op Procedure(s) (LRB): TOTAL SHOULDER ARTHROPLASTY (Right)   Advance diet D/C IV fluids Plan for discharge tomorrow Non Weight Bearing (NWB)  VTE prophylaxis: pharmacologic prophylaxis (with any of the following: warfarin adjusted-dose) Cipro for UTI D/c foley Labs pending.   Mable Paris 10/23/2011, 6:50 AM

## 2011-10-24 ENCOUNTER — Encounter (HOSPITAL_COMMUNITY): Payer: Self-pay | Admitting: Orthopedic Surgery

## 2011-10-24 DIAGNOSIS — M19019 Primary osteoarthritis, unspecified shoulder: Secondary | ICD-10-CM | POA: Diagnosis present

## 2011-10-24 LAB — PROTIME-INR: INR: 1.55 — ABNORMAL HIGH (ref 0.00–1.49)

## 2011-10-24 MED ORDER — HYDROCODONE-ACETAMINOPHEN 5-325 MG PO TABS
2.0000 | ORAL_TABLET | ORAL | Status: DC | PRN
  Administered 2011-10-24 (×2): 2 via ORAL
  Filled 2011-10-24 (×2): qty 2

## 2011-10-24 MED ORDER — CIPROFLOXACIN HCL 250 MG PO TABS: 250.0000 mg | ORAL_TABLET | Freq: Two times a day (BID) | ORAL | Status: AC

## 2011-10-24 MED ORDER — HYDROCODONE-ACETAMINOPHEN 5-325 MG PO TABS: 2.0000 | ORAL_TABLET | ORAL | Status: AC | PRN

## 2011-10-24 MED ORDER — OXYCODONE-ACETAMINOPHEN 5-325 MG PO TABS: 2.0000 | ORAL_TABLET | ORAL | Status: DC | PRN

## 2011-10-24 MED ORDER — DSS 100 MG PO CAPS: 100.0000 mg | ORAL_CAPSULE | Freq: Two times a day (BID) | ORAL | Status: AC

## 2011-10-24 MED ORDER — WARFARIN SODIUM 5 MG PO TABS: 5.0000 mg | ORAL_TABLET | Freq: Every day | ORAL | Status: DC

## 2011-10-24 NOTE — Discharge Summary (Signed)
Patient ID: [name] MRN: [id number] DOB/AGE: 1950/10/18 61 y.o.  Admit date: 10/22/2011 Discharge date: 10/24/2011  Admission Diagnoses:  Principal Problem:  *Glenohumeral arthritis   Discharge Diagnoses:  Same  Past Medical History  Diagnosis Date  . Complication of anesthesia     @ [place].....COULDN'T GET HER AWAKE....FOR  HERNIA SURGERY... PLACED ON VENTILATOR  . CHF (congestive heart failure)     [name].......LAST OFFICE VISIT WAS A FEW AGO  . Hypertension   . Bronchitis     USES NEB  AS NEEDED  . GERD (gastroesophageal reflux disease)     TAKES PRILOSEC BID  . Arthritis   . Anxiety     ANXIETY ONCE....PLACED ON CELEXA  . 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 IBS (irritable bowel syndrome)     Surgeries: Procedure(s): TOTAL SHOULDER ARTHROPLASTY on 10/22/2011   Consultants:  none  Discharged Condition: Improved  Hospital Course: [name] is an 61 y.o. female who was admitted 10/22/2011 for operative treatment ofGlenohumeral arthritis. Patient has severe unremitting pain that affects sleep, daily activities, and work/hobbies. After pre-op clearance the patient was taken to the operating room on 10/22/2011 and underwent  Procedure(s): TOTAL SHOULDER ARTHROPLASTY.    Patient was given perioperative antibiotics: Anti-infectives     Start     Dose/Rate Route Frequency Ordered Stop   10/24/11 0000   ciprofloxacin (CIPRO) 250 MG tablet        250 mg Oral 2 times daily 10/24/11 0748 11/03/11 2359   10/22/11 1330   ciprofloxacin (CIPRO) tablet 250 mg        250 mg Oral 2 times daily 10/22/11 1037 10/25/11 0759   10/21/11 1430   ceFAZolin (ANCEF) IVPB 2 g/50 mL premix        2 g 100 mL/hr over 30 Minutes Intravenous 60 min pre-op 10/21/11 1423 10/22/11 0757           Patient was given sequential compression devices, early ambulation, and chemoprophylaxis to prevent DVT. Preop screening showed UTI and she was treated with 3  day course of cipro. Wound c/d/i at d/c.  Patient benefited maximally from hospital stay and there were no complications.    Recent vital signs: Patient Vitals for the past 24 hrs:  BP Temp Pulse Resp SpO2  10/24/11 0544 169/76 mmHg 99.7 F (37.6 C) 94  18  98 %  11-Nov-2011 2123 122/49 mmHg 99.7 F (37.6 C) 84  18  98 %  2011-11-11 1414 - - - - 98 %  11-11-11 1354 121/53 mmHg 98.8 F (37.1 C) 82  16  94 %     Recent laboratory studies:  Basename 10/24/11 0545 November 11, 2011 0555  WBC -- 6.3  HGB -- 11.2*  HCT -- 34.0*  PLT -- 197  NA -- 138  K -- 3.4*  CL -- 103  CO2 -- 28  BUN -- 5*  CREATININE -- 1.03  GLUCOSE -- 133*  INR 1.55* 1.13  CALCIUM -- 8.2*     Discharge Medications:   Medication List  As of 10/24/2011  7:52 AM   STOP taking these medications         diclofenac 75 MG EC tablet         TAKE these medications         ciprofloxacin 250 MG tablet   Commonly known as: CIPRO   Take 1 tablet (250 mg total) by mouth 2 (two) times daily.  citalopram 20 MG tablet   Commonly known as: CELEXA   Take 20 mg by mouth daily.      DSS 100 MG Caps   Take 100 mg by mouth 2 (two) times daily.      furosemide 80 MG tablet   Commonly known as: LASIX   Take 80 mg by mouth 2 (two) times daily.      losartan-hydrochlorothiazide 100-12.5 MG per tablet   Commonly known as: HYZAAR   Take 1 tablet by mouth daily.      methocarbamol 750 MG tablet   Commonly known as: ROBAXIN   Take 750 mg by mouth 4 (four) times daily as needed. For spasms      omeprazole 20 MG capsule   Commonly known as: PRILOSEC   Take 20 mg by mouth 2 (two) times daily.      oxyCODONE-acetaminophen 5-325 MG per tablet   Commonly known as: PERCOCET   Take 2 tablets by mouth every 4 (four) hours as needed for pain. For pain      potassium chloride SA 20 MEQ tablet   Commonly known as: K-DUR,KLOR-CON   Take 20 mEq by mouth 2 (two) times daily.      simvastatin 20 MG tablet   Commonly known as:  ZOCOR   Take 20 mg by mouth every evening.      tobramycin-dexamethasone ophthalmic solution   Commonly known as: TOBRADEX   Place 1 drop into both eyes 4 (four) times daily. For eye infection      Vitamin D (Ergocalciferol) 50000 UNITS Caps   Commonly known as: DRISDOL   Take 50,000 Units by mouth every 7 (seven) days. On Wednesday      warfarin 5 MG tablet   Commonly known as: COUMADIN   Take 1 tablet (5 mg total) by mouth daily.            Diagnostic Studies: Dg Shoulder Right  10/22/2011  *RADIOLOGY REPORT*  Clinical Data: Post shoulder arthroplasty.  The  RIGHT SHOULDER - 2+ VIEW  Comparison: 04/28/2009 MR.  Findings: Post right shoulder replacement which appears in satisfactory position on this single projection.  Acromioclavicular joint degenerative changes.  Crowding lung markings with poor inspiration right lung.  IMPRESSION: Post right shoulder replacement which appears in satisfactory position on this single projection.  Original Report Authenticated By: Fuller Canada, M.D.    Disposition: 01-Home or Self Care  Discharge Orders    Future Orders Please Complete By Expires   Diet - low sodium heart healthy      Call MD / Call 911      Comments:   If you experience chest pain or shortness of breath, CALL 911 and be transported to the hospital emergency room.  If you develope a fever above 101 F, pus (white drainage) or increased drainage or redness at the wound, or calf pain, call your surgeon's office.   Constipation Prevention      Comments:   Drink plenty of fluids.  Prune juice may be helpful.  You may use a stool softener, such as Colace (over the counter) 100 mg twice a day.  Use MiraLax (over the counter) for constipation as needed.   Increase activity slowly as tolerated      Weight Bearing as taught in Physical Therapy      Comments:   Use a walker or crutches as instructed.   Driving restrictions      Comments:   No driving for 6 weeks  Coumadin x 2  wks for VTE prophylaxis.  Follow-up Information    Follow up with Mable Paris, MD in 2 weeks.   Contact information:   [facility] [address], [address] [place] [address] [phone]           Signed: Mable Paris 10/24/2011, 7:52 AM

## 2011-10-24 NOTE — Progress Notes (Signed)
PATIENT ID: [name]  MRN: [id number]  DOB/AGE:  [birth year] / 61 y.o.  2 Days Post-Op Procedure(s) (LRB): TOTAL SHOULDER ARTHROPLASTY (Right)  Subjective: Pain is mild.  No c/o chest pain or SOB.   Upset stomach yesterday, now improved.  Eating breakfast.  Worked with OT.   Objective: Vital signs in last 24 hours: Temp:  [98.8 F (37.1 C)-99.7 F (37.6 C)] 99.7 F (37.6 C) (03/07 0544) Pulse Rate:  [82-94] 94  (03/07 0544) Resp:  [16-18] 18  (03/07 0544) BP: (121-169)/(49-76) 169/76 mmHg (03/07 0544) SpO2:  [94 %-98 %] 98 % (03/07 0544)  Intake/Output from previous day:   Intake/Output this shift:     Basename 10/23/11 0555  HGB 11.2*    Basename 10/23/11 0555  WBC 6.3  RBC 3.91  HCT 34.0*  PLT 197    Basename 10/23/11 0555  NA 138  K 3.4*  CL 103  CO2 28  BUN 5*  CREATININE 1.03  GLUCOSE 133*  CALCIUM 8.2*    Basename 10/24/11 0545 10/23/11 0555  LABPT -- --  INR 1.55* 1.13    Physical Exam: Sensation intact distally Intact pulses distally Incision: no drainage Compartment soft Dressing d/c'd  Assessment/Plan: 2 Days Post-Op Procedure(s) (LRB): TOTAL SHOULDER ARTHROPLASTY (Right)   Discharge home with home health Non Weight Bearing (NWB)  VTE prophylaxis: pharmacologic prophylaxis (with any of the following: warfarin adjusted-dose) HHOT    [name] WILLIAM 10/24/2011, 7:37 AM

## 2011-10-24 NOTE — Progress Notes (Signed)
CARE MANAGEMENT NOTE 10/24/2011  Anticipated DC Date:  10/24/2011   Anticipated DC Plan:  HOME W HOME HEALTH SERVICES      DC Planning Services  CM consult      [facility] Choice  HOME HEALTH   Choice offered to / List presented to:  C-1 Patient   DME arranged  NA      DME agency  NA     HH arranged  HH-2 PT  HH-1 RN      [facility] agency  [facility]   Status of service:  Completed, signed off Discharge Disposition:  HOME W HOME HEALTH SERVICES     Comments:  10/24/11- 1400 Vance Peper, RN BSN Case Manager [phone] Patient will need Home Health RN for coumadin management, and OT, choice offered. Entered in [facility].

## 2011-10-24 NOTE — Progress Notes (Signed)
Occupational Therapy Treatment Patient Details Name: [name] MRN: [id number] DOB: 05-24-1951 Today's Date: 10/24/2011  OT Assessment/Plan OT Assessment/Plan Comments on Treatment Session: Pt. is very motivated.  Requires assist with ADLs and exercises due to body habitus.  Recommend HHOT OT Plan: Discharge plan remains appropriate OT Frequency: Min 2X/week Equipment Recommended: None recommended by OT OT Goals ADL Goals ADL Goal: Upper Body Bathing - Progress: Progressing toward goals ADL Goal: Lower Body Bathing - Progress: Met ADL Goal: Upper Body Dressing - Progress: Progressing toward goals ADL Goal: Toilet Transfer - Progress: Progressing toward goals ADL Goal: Toileting - Clothing Manipulation - Progress: Progressing toward goals ADL Goal: Toileting - Hygiene - Progress: Met Miscellaneous OT Goals OT Goal: Miscellaneous Goal #1 - Progress: Progressing toward goals  OT Treatment Precautions/Restrictions  Precautions Precautions: Shoulder Type of Shoulder Precautions: Forward flexion to 140 in supine self ROM; ER supine to 40 using cane Precaution Booklet Issued: Yes (comment) (Pt has shoulder handout) Required Braces or Orthoses: Yes (sling) Restrictions Weight Bearing Restrictions: Yes RUE Weight Bearing: Non weight bearing   ADL ADL Grooming: Performed;Wash/dry hands;Wash/dry face;Set up Where Assessed - Grooming: Sitting, bed Upper Body Bathing: Performed;Moderate assistance Upper Body Bathing Details (indicate cue type and reason): Pt. able to verbalize safe technique for Rt. UE Where Assessed - Upper Body Bathing: Sitting, bed;Unsupported Lower Body Bathing: Performed;Minimal assistance Where Assessed - Lower Body Bathing: Sit to stand from bed Upper Body Dressing: Performed;Moderate assistance Upper Body Dressing Details (indicate cue type and reason): gown.  Pt. able to verbalize safe technique Where Assessed - Upper Body Dressing: Unsupported;Sitting,  bed Lower Body Dressing: Not assessed Toilet Transfer: Performed;Supervision/safety Toilet Transfer Method: Stand pivot Toilet Transfer Equipment: Bedside commode Toileting - Clothing Manipulation: Performed;Supervision/safety Where Assessed - Toileting Clothing Manipulation: Standing;Sit to stand from 3-in-1 or toilet Toileting - Hygiene: Independent Where Assessed - Toileting Hygiene: Standing Ambulation Related to ADLs: supervision ADL Comments: Pt. requires assistance for ADLs due to body habitus.  She required min cues to avoid weighbearing on Rt. UE during sit to stand, and she did attempt to support self with Rt. UE while standing to bathe peri area.   Mobility  Bed Mobility Bed Mobility: Yes Rolling Left: 4: Min assist Left Sidelying to Sit: 4: Min assist;HOB flat Transfers Transfers: Yes Sit to Stand: 5: Supervision;From chair/3-in-1;From bed Sit to Stand Details (indicate cue type and reason): cues to avoid weightbearing on Rt. UE Stand to Sit: 5: Supervision;To bed Exercises Shoulder Exercises Shoulder Flexion: Self ROM;Right;10 reps (achieved ~115) Shoulder External Rotation: AAROM;Self ROM;Right;Supine;Other (comment);10 reps (ER achieved 40 using cane) Elbow Flexion: AROM;AAROM;Right;10 reps  End of Session OT - End of Session Activity Tolerance: Patient limited by pain Patient left: in bed;with call bell in reach Nurse Communication: Other (comment) (pain level) General Behavior During Session: [facility] for tasks performed Cognition: [facility] for tasks performed  Nathaneal Sommers, Ursula Alert M  10/24/2011, 12:41 PM

## 2011-10-24 NOTE — Discharge Instructions (Signed)
Discharge Instructions after Total Shoulder Arthroplasty   A sling has been provided for you. Remove the sling 5 times each day to perform motion exercises. After the first 48 to 72 hours, discontinue using the sling. You should use the sling as a protective device, if you are in a crowd.  Use ice on the shoulder intermittently over the first 48 hours after surgery.  Pain medication has been prescribed for you.  Use your medication liberally over the first 48 hours, and then begin to taper your use. You may take Extra Strength Tylenol or Tylenol only in place of the pain pills. DO NOT take ANY nonsteroidal anti-inflammatory pain medications: Advil, Motrin, Ibuprofen, Aleve, Naproxen, or Naprosyn. Take one aspirin a day for 2 weeks after surgery, unless you have an aspirin sensitivity/allergy or asthma. You may remove your dressing after two days.  You may shower 5 days after surgery. The incision CANNOT get wet prior to 5 days. Simply allow the water to wash over the site and then pat dry. Do not rub the incision. Make sure your axilla (armpit) is completely dry after showering.  Active reaching and lifting are not permitted. You may use the operative arm for activities of daily living that do not require the operative arm to leave the side of the body, such as eating, drinking, bathing, etc.  Three to 5 times each day you should perform assisted overhead reaching and external rotation (outward turning) exercises with the operative arm. You were taught these exercises prior to discharge. Both exercises should be done with the non-operative arm used as the "therapist arm" while the operative arm remains relaxed. Ten of each exercise should be done three to five times each day.   Overhead reach is helping to lift your stiff arm up as high as it will go. To stretch your overhead reach, lie flat on your back, relax, and grasp the wrist of the tight shoulder with your opposite hand. Using the power in your  opposite arm, bring the stiff arm up as far as it is comfortable. Start holding it for ten seconds and then work up to where you can hold it for a count of 30. Breathe slowly and deeply while the arm is moved. Repeat this stretch ten times, trying to help the ar up a little higher each time.     External rotation is turning the arm out to the side while your elbow stays close to your body. External rotation is best stretched while you are lying on your back. Hold a cane, yardstick, broom handle, or dowel in both hands. Bend both elbows to a right angle. Use steady, gentle force from your normal arm to rotate the hand of the stiff shoulder out away from your body. Continue the rotation as far as it will go comfortably, holding it there for a count of 10. Repeat this exercise ten times.      Please call [phone] during normal business hours or [phone] after hours for any problems. Including the following:  - excessive redness of the incisions - drainage for more than 4 days - fever of more than 101.5 F  *Please note that pain medications will not be refilled after hours or on weekends.   Home Health RN/OT to be provided by [facility]- [phone]

## 2011-10-24 NOTE — Progress Notes (Signed)
Utilization review completed. Anette Guarneri, RN, BSN. 10/24/11

## 2011-11-13 ENCOUNTER — Ambulatory Visit: Payer: Medicaid Other | Attending: Orthopedic Surgery | Admitting: Physical Therapy

## 2011-11-13 DIAGNOSIS — M25619 Stiffness of unspecified shoulder, not elsewhere classified: Secondary | ICD-10-CM | POA: Insufficient documentation

## 2011-11-13 DIAGNOSIS — IMO0001 Reserved for inherently not codable concepts without codable children: Secondary | ICD-10-CM | POA: Insufficient documentation

## 2011-11-13 DIAGNOSIS — M25519 Pain in unspecified shoulder: Secondary | ICD-10-CM | POA: Insufficient documentation

## 2011-11-19 ENCOUNTER — Encounter: Payer: Medicaid Other | Admitting: Rehabilitation

## 2011-11-21 ENCOUNTER — Ambulatory Visit: Payer: Medicaid Other | Attending: Orthopedic Surgery

## 2011-11-21 DIAGNOSIS — IMO0001 Reserved for inherently not codable concepts without codable children: Secondary | ICD-10-CM | POA: Insufficient documentation

## 2011-11-21 DIAGNOSIS — M25519 Pain in unspecified shoulder: Secondary | ICD-10-CM | POA: Insufficient documentation

## 2011-11-21 DIAGNOSIS — M25619 Stiffness of unspecified shoulder, not elsewhere classified: Secondary | ICD-10-CM | POA: Insufficient documentation

## 2011-11-26 ENCOUNTER — Ambulatory Visit: Payer: Medicaid Other | Admitting: Physical Therapy

## 2011-11-28 ENCOUNTER — Encounter: Payer: Medicaid Other | Admitting: Physical Therapy

## 2011-12-03 ENCOUNTER — Ambulatory Visit: Payer: Medicaid Other | Admitting: Physical Therapy

## 2011-12-05 ENCOUNTER — Encounter: Payer: Medicaid Other | Admitting: Physical Therapy

## 2011-12-10 ENCOUNTER — Ambulatory Visit: Payer: Medicaid Other | Admitting: Physical Therapy

## 2011-12-20 ENCOUNTER — Ambulatory Visit: Payer: Medicaid Other | Attending: Orthopedic Surgery | Admitting: Physical Therapy

## 2011-12-20 DIAGNOSIS — M25519 Pain in unspecified shoulder: Secondary | ICD-10-CM | POA: Insufficient documentation

## 2011-12-20 DIAGNOSIS — IMO0001 Reserved for inherently not codable concepts without codable children: Secondary | ICD-10-CM | POA: Insufficient documentation

## 2011-12-20 DIAGNOSIS — M25619 Stiffness of unspecified shoulder, not elsewhere classified: Secondary | ICD-10-CM | POA: Insufficient documentation

## 2011-12-22 ENCOUNTER — Emergency Department (HOSPITAL_COMMUNITY)
Admission: EM | Admit: 2011-12-22 | Discharge: 2011-12-22 | Disposition: A | Discharge status: Home / Self Care | Payer: Medicaid Other | Attending: Emergency Medicine | Admitting: Emergency Medicine

## 2011-12-22 ENCOUNTER — Emergency Department (HOSPITAL_COMMUNITY): Payer: Medicaid Other

## 2011-12-22 ENCOUNTER — Encounter (HOSPITAL_COMMUNITY): Payer: Self-pay | Admitting: Emergency Medicine

## 2011-12-22 DIAGNOSIS — N390 Urinary tract infection, site not specified: Secondary | ICD-10-CM | POA: Insufficient documentation

## 2011-12-22 DIAGNOSIS — K219 Gastro-esophageal reflux disease without esophagitis: Secondary | ICD-10-CM | POA: Insufficient documentation

## 2011-12-22 DIAGNOSIS — M25519 Pain in unspecified shoulder: Secondary | ICD-10-CM | POA: Insufficient documentation

## 2011-12-22 DIAGNOSIS — E7800 Pure hypercholesterolemia, unspecified: Secondary | ICD-10-CM | POA: Insufficient documentation

## 2011-12-22 DIAGNOSIS — I10 Essential (primary) hypertension: Secondary | ICD-10-CM | POA: Insufficient documentation

## 2011-12-22 DIAGNOSIS — M5432 Sciatica, left side: Secondary | ICD-10-CM

## 2011-12-22 DIAGNOSIS — M79609 Pain in unspecified limb: Secondary | ICD-10-CM | POA: Insufficient documentation

## 2011-12-22 DIAGNOSIS — M5430 Sciatica, unspecified side: Secondary | ICD-10-CM | POA: Insufficient documentation

## 2011-12-22 DIAGNOSIS — M25511 Pain in right shoulder: Secondary | ICD-10-CM

## 2011-12-22 DIAGNOSIS — K589 Irritable bowel syndrome without diarrhea: Secondary | ICD-10-CM | POA: Insufficient documentation

## 2011-12-22 DIAGNOSIS — I509 Heart failure, unspecified: Secondary | ICD-10-CM | POA: Insufficient documentation

## 2011-12-22 DIAGNOSIS — Z79899 Other long term (current) drug therapy: Secondary | ICD-10-CM | POA: Insufficient documentation

## 2011-12-22 HISTORY — DX: Pure hypercholesterolemia, unspecified: E78.00

## 2011-12-22 LAB — URINALYSIS, ROUTINE W REFLEX MICROSCOPIC
Bilirubin Urine: NEGATIVE
Glucose, UA: NEGATIVE mg/dL
Ketones, ur: NEGATIVE mg/dL
Nitrite: POSITIVE — AB
Specific Gravity, Urine: 1.021 (ref 1.005–1.030)
pH: 6 (ref 5.0–8.0)

## 2011-12-22 LAB — URINE MICROSCOPIC-ADD ON

## 2011-12-22 MED ORDER — OXYCODONE-ACETAMINOPHEN 5-325 MG PO TABS: 1.0000 | ORAL_TABLET | ORAL | Status: AC | PRN

## 2011-12-22 MED ORDER — CEFPODOXIME PROXETIL 100 MG PO TABS: 100.0000 mg | ORAL_TABLET | Freq: Two times a day (BID) | ORAL | Status: AC

## 2011-12-22 MED ORDER — CEFPODOXIME PROXETIL 200 MG PO TABS
100.0000 mg | ORAL_TABLET | ORAL | Status: AC
  Administered 2011-12-22: 100 mg via ORAL
  Filled 2011-12-22: qty 1

## 2011-12-22 MED ORDER — DOCUSATE SODIUM 100 MG PO CAPS: 100.0000 mg | ORAL_CAPSULE | Freq: Two times a day (BID) | ORAL | Status: AC | PRN

## 2011-12-22 MED ORDER — DIAZEPAM 5 MG PO TABS
5.0000 mg | ORAL_TABLET | Freq: Once | ORAL | Status: AC
  Administered 2011-12-22: 5 mg via ORAL
  Filled 2011-12-22: qty 1

## 2011-12-22 MED ORDER — HYDROCODONE-ACETAMINOPHEN 5-325 MG PO TABS
1.0000 | ORAL_TABLET | Freq: Once | ORAL | Status: AC
  Administered 2011-12-22: 1 via ORAL
  Filled 2011-12-22: qty 1

## 2011-12-22 MED ORDER — DIAZEPAM 5 MG PO TABS: 5.0000 mg | ORAL_TABLET | Freq: Two times a day (BID) | ORAL | Status: AC | PRN

## 2011-12-22 NOTE — Discharge Instructions (Signed)
Urinary Tract Infection Infections of the urinary tract can start in several places. A bladder infection (cystitis), a kidney infection (pyelonephritis), and a prostate infection (prostatitis) are different types of urinary tract infections (UTIs). They usually get better if treated with medicines (antibiotics) that kill germs. Take all the medicine until it is gone. You or your child may feel better in a few days, but TAKE ALL MEDICINE or the infection may not respond and may become more difficult to treat. HOME CARE INSTRUCTIONS   Drink enough water and fluids to keep the urine clear or pale yellow. Cranberry juice is especially recommended, in addition to large amounts of water.   Avoid caffeine, tea, and carbonated beverages. They tend to irritate the bladder.   Alcohol may irritate the prostate.   Only take over-the-counter or prescription medicines for pain, discomfort, or fever as directed by your caregiver.  To prevent further infections:  Empty the bladder often. Avoid holding urine for long periods of time.   After a bowel movement, women should cleanse from front to back. Use each tissue only once.   Empty the bladder before and after sexual intercourse.  FINDING OUT THE RESULTS OF YOUR TEST Not all test results are available during your visit. If your or your child's test results are not back during the visit, make an appointment with your caregiver to find out the results. Do not assume everything is normal if you have not heard from your caregiver or the medical facility. It is important for you to follow up on all test results. SEEK MEDICAL CARE IF:   There is back pain.   Your baby is older than 3 months with a rectal temperature of 100.5 F (38.1 C) or higher for more than 1 day.   Your or your child's problems (symptoms) are no better in 3 days. Return sooner if you or your child is getting worse.  SEEK IMMEDIATE MEDICAL CARE IF:   There is severe back pain or lower  abdominal pain.   You or your child develops chills.   You have a fever.   Your baby is older than 3 months with a rectal temperature of 102 F (38.9 C) or higher.   Your baby is 24 months old or younger with a rectal temperature of 100.4 F (38 C) or higher.   There is nausea or vomiting.   There is continued burning or discomfort with urination.  MAKE SURE YOU:   Understand these instructions.   Will watch your condition.   Will get help right away if you are not doing well or get worse.  Document Released: 05/15/2005 Document Revised: 07/25/2011 Document Reviewed: 12/18/2006 [facility] Patient Information 2012 [place], [place].  Sciatica Sciatica is a weakness and/or changes in sensation (tingling, jolts, hot and cold, numbness) along the path the sciatic nerve travels. Irritation or damage to lumbar nerve roots is often also referred to as lumbar radiculopathy.  Lumbar radiculopathy (Sciatica) is the most common form of this problem. Radiculopathy can occur in any of the nerves coming out of the spinal cord. The problems caused depend on which nerves are involved. The sciatic nerve is the large nerve supplying the branches of nerves going from the hip to the toes. It often causes a numbness or weakness in the skin and/or muscles that the sciatic nerve serves. It also may cause symptoms (problems) of pain, burning, tingling, or electric shock-like feelings in the path of this nerve. This usually comes from injury to the  fibers that make up the sciatic nerve. Some of these symptoms are low back pain and/or unpleasant feelings in the following areas:  From the mid-buttock down the back of the leg to the back of the knee.   And/or the outside of the calf and top of the foot.   And/or behind the inner ankle to the sole of the foot.  CAUSES   Herniated or slipped disc. Discs are the little cushions between the bones in the back.   Pressure by the piriformis muscle in the buttock on  the sciatic nerve (Piriformis Syndrome).   Misalignment of the bones in the lower back and buttocks (Sacroiliac Joint Derangement).   Narrowing of the spinal canal that puts pressure on or pinches the fibers that make up the sciatic nerve.   A slipped vertebra that is out of line with those above or beneath it.   Abnormality of the nervous system itself so that nerve fibers do not transmit signals properly, especially to feet and calves (neuropathy).   Tumor (this is rare).  Your caregiver can usually determine the cause of your sciatica and begin the treatment most likely to help you. TREATMENT  Taking over-the-counter painkillers, physical therapy, rest, exercise, spinal manipulation, and injections of anesthetics and/or steroids may be used. Surgery, acupuncture, and Yoga can also be effective. Mind over matter techniques, mental imagery, and changing factors such as your bed, chair, desk height, posture, and activities are other treatments that may be helpful. You and your caregiver can help determine what is best for you. With proper diagnosis, the cause of most sciatica can be identified and removed. Communication and cooperation between your caregiver and you is essential. If you are not successful immediately, do not be discouraged. With time, a proper treatment can be found that will make you comfortable. HOME CARE INSTRUCTIONS   If the pain is coming from a problem in the back, applying ice to that area for 15 to 20 minutes, 3 to 4 times per day while awake, may be helpful. Put the ice in a plastic bag. Place a towel between the bag of ice and your skin.   You may exercise or perform your usual activities if these do not aggravate your pain, or as suggested by your caregiver.   Only take over-the-counter or prescription medicines for pain, discomfort, or fever as directed by your caregiver.   If your caregiver has given you a follow-up appointment, it is very important to keep that  appointment. Not keeping the appointment could result in a chronic or permanent injury, pain, and disability. If there is any problem keeping the appointment, you must call back to this facility for assistance.  SEEK IMMEDIATE MEDICAL CARE IF:   You experience loss of control of bowel or bladder.   You have increasing weakness in the trunk, buttocks, or legs.   There is numbness in any areas from the hip down to the toes.   You have difficulty walking or keeping your balance.   You have any of the above, with fever or forceful vomiting.  Document Released: 07/30/2001 Document Revised: 07/25/2011 Document Reviewed: 03/18/2008 [facility] Patient Information 2012 [place], [place].

## 2011-12-22 NOTE — ED Notes (Signed)
Pt reports Felt something pop in her right shoulder today. Left leg pain x 1 week. And garlic odor in urine for unknown amount of time.
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
## 2011-12-24 ENCOUNTER — Encounter: Payer: Medicaid Other | Admitting: Physical Therapy

## 2011-12-24 ENCOUNTER — Ambulatory Visit: Payer: Medicaid Other | Admitting: Physical Therapy

## 2011-12-26 ENCOUNTER — Encounter: Payer: Medicaid Other | Admitting: Physical Therapy

## 2012-01-01 ENCOUNTER — Other Ambulatory Visit: Payer: Self-pay | Admitting: Orthopedic Surgery

## 2012-01-01 ENCOUNTER — Ambulatory Visit: Payer: Medicaid Other | Admitting: Physical Therapy

## 2012-01-01 DIAGNOSIS — S42251S Displaced fracture of greater tuberosity of right humerus, sequela: Secondary | ICD-10-CM

## 2012-01-02 ENCOUNTER — Ambulatory Visit
Admission: RE | Admit: 2012-01-02 | Discharge: 2012-01-02 | Disposition: A | Discharge status: Home / Self Care | Payer: Medicaid Other | Source: Ambulatory Visit | Attending: Orthopedic Surgery | Admitting: Orthopedic Surgery

## 2012-01-02 DIAGNOSIS — S42251S Displaced fracture of greater tuberosity of right humerus, sequela: Secondary | ICD-10-CM

## 2012-01-03 ENCOUNTER — Ambulatory Visit: Payer: Medicaid Other | Admitting: Physical Therapy

## 2012-01-06 ENCOUNTER — Encounter (HOSPITAL_COMMUNITY): Payer: Self-pay | Admitting: Pharmacy Technician

## 2012-01-06 ENCOUNTER — Other Ambulatory Visit: Payer: Self-pay | Admitting: Orthopedic Surgery

## 2012-01-07 ENCOUNTER — Encounter: Payer: Medicaid Other | Admitting: Physical Therapy

## 2012-01-08 ENCOUNTER — Encounter (HOSPITAL_COMMUNITY): Payer: Self-pay | Admitting: *Deleted

## 2012-01-08 MED ORDER — CEFAZOLIN SODIUM-DEXTROSE 2-3 GM-% IV SOLR
2.0000 g | INTRAVENOUS | Status: AC
  Administered 2012-01-09: 2 g via INTRAVENOUS
  Filled 2012-01-08: qty 50

## 2012-01-08 MED ORDER — CHLORHEXIDINE GLUCONATE 4 % EX LIQD: 60.0000 mL | Freq: Once | CUTANEOUS | Status: DC

## 2012-01-09 ENCOUNTER — Encounter (HOSPITAL_COMMUNITY): Payer: Self-pay | Admitting: Anesthesiology

## 2012-01-09 ENCOUNTER — Ambulatory Visit (HOSPITAL_COMMUNITY): Payer: Medicaid Other | Admitting: Anesthesiology

## 2012-01-09 ENCOUNTER — Encounter (HOSPITAL_COMMUNITY)
Admission: RE | Disposition: A | Discharge status: Home / Self Care | Payer: Self-pay | Source: Ambulatory Visit | Attending: Orthopedic Surgery

## 2012-01-09 ENCOUNTER — Encounter: Payer: Medicaid Other | Admitting: Physical Therapy

## 2012-01-09 ENCOUNTER — Ambulatory Visit (HOSPITAL_COMMUNITY): Payer: Medicaid Other

## 2012-01-09 ENCOUNTER — Ambulatory Visit (HOSPITAL_COMMUNITY)
Admission: RE | Admit: 2012-01-09 | Discharge: 2012-01-10 | Disposition: A | Discharge status: Home / Self Care | Payer: Medicaid Other | Source: Ambulatory Visit | Attending: Orthopedic Surgery | Admitting: Orthopedic Surgery

## 2012-01-09 ENCOUNTER — Encounter (HOSPITAL_COMMUNITY): Payer: Self-pay | Admitting: *Deleted

## 2012-01-09 DIAGNOSIS — M129 Arthropathy, unspecified: Secondary | ICD-10-CM | POA: Insufficient documentation

## 2012-01-09 DIAGNOSIS — K219 Gastro-esophageal reflux disease without esophagitis: Secondary | ICD-10-CM | POA: Insufficient documentation

## 2012-01-09 DIAGNOSIS — Y831 Surgical operation with implant of artificial internal device as the cause of abnormal reaction of the patient, or of later complication, without mention of misadventure at the time of the procedure: Secondary | ICD-10-CM | POA: Insufficient documentation

## 2012-01-09 DIAGNOSIS — M24019 Loose body in unspecified shoulder: Secondary | ICD-10-CM | POA: Insufficient documentation

## 2012-01-09 DIAGNOSIS — M19019 Primary osteoarthritis, unspecified shoulder: Secondary | ICD-10-CM

## 2012-01-09 DIAGNOSIS — I509 Heart failure, unspecified: Secondary | ICD-10-CM | POA: Insufficient documentation

## 2012-01-09 DIAGNOSIS — F411 Generalized anxiety disorder: Secondary | ICD-10-CM | POA: Insufficient documentation

## 2012-01-09 DIAGNOSIS — M1990 Unspecified osteoarthritis, unspecified site: Secondary | ICD-10-CM | POA: Insufficient documentation

## 2012-01-09 DIAGNOSIS — Z96619 Presence of unspecified artificial shoulder joint: Secondary | ICD-10-CM | POA: Insufficient documentation

## 2012-01-09 DIAGNOSIS — I10 Essential (primary) hypertension: Secondary | ICD-10-CM | POA: Insufficient documentation

## 2012-01-09 DIAGNOSIS — T84099A Other mechanical complication of unspecified internal joint prosthesis, initial encounter: Secondary | ICD-10-CM | POA: Insufficient documentation

## 2012-01-09 HISTORY — PX: SHOULDER OPEN ROTATOR CUFF REPAIR: SHX2407

## 2012-01-09 LAB — CBC
HCT: 40.1 % (ref 36.0–46.0)
MCHC: 33.4 g/dL (ref 30.0–36.0)
MCV: 85 fL (ref 78.0–100.0)
Platelets: 229 10*3/uL (ref 150–400)
RBC: 4.72 MIL/uL (ref 3.87–5.11)
RDW: 13.6 % (ref 11.5–15.5)
WBC: 5.2 10*3/uL (ref 4.0–10.5)

## 2012-01-09 LAB — TYPE AND SCREEN
ABO/RH(D): B POS
Antibody Screen: NEGATIVE

## 2012-01-09 LAB — BASIC METABOLIC PANEL
CO2: 27 mEq/L (ref 19–32)
Chloride: 103 mEq/L (ref 96–112)
Creatinine, Ser: 0.83 mg/dL (ref 0.50–1.10)
Glucose, Bld: 102 mg/dL — ABNORMAL HIGH (ref 70–99)
Sodium: 143 mEq/L (ref 135–145)

## 2012-01-09 SURGERY — REPAIR, ROTATOR CUFF, OPEN
Anesthesia: General | Site: Shoulder | Laterality: Right | Wound class: Clean

## 2012-01-09 MED ORDER — MIDAZOLAM HCL 2 MG/2ML IJ SOLN
INTRAMUSCULAR | Status: AC
  Filled 2012-01-09: qty 2

## 2012-01-09 MED ORDER — FENTANYL CITRATE 0.05 MG/ML IJ SOLN
INTRAMUSCULAR | Status: DC | PRN
  Administered 2012-01-09: 100 ug via INTRAVENOUS

## 2012-01-09 MED ORDER — GLYCOPYRROLATE 0.2 MG/ML IJ SOLN
INTRAMUSCULAR | Status: DC | PRN
  Administered 2012-01-09: 0.4 mg via INTRAVENOUS

## 2012-01-09 MED ORDER — METOCLOPRAMIDE HCL 5 MG/ML IJ SOLN: 5.0000 mg | Freq: Three times a day (TID) | INTRAMUSCULAR | Status: DC | PRN

## 2012-01-09 MED ORDER — OXYCODONE-ACETAMINOPHEN 5-325 MG PO TABS: 1.0000 | ORAL_TABLET | ORAL | Status: DC | PRN

## 2012-01-09 MED ORDER — POTASSIUM CHLORIDE CRYS ER 20 MEQ PO TBCR
20.0000 meq | EXTENDED_RELEASE_TABLET | Freq: Two times a day (BID) | ORAL | Status: DC
  Administered 2012-01-09: 20 meq via ORAL
  Filled 2012-01-09 (×3): qty 1

## 2012-01-09 MED ORDER — CITALOPRAM HYDROBROMIDE 20 MG PO TABS
20.0000 mg | ORAL_TABLET | Freq: Every day | ORAL | Status: DC
Start: 2012-01-09 — End: 2012-01-10
  Administered 2012-01-09: 20 mg via ORAL
  Filled 2012-01-09 (×2): qty 1

## 2012-01-09 MED ORDER — FENTANYL CITRATE 0.05 MG/ML IJ SOLN: 25.0000 ug | INTRAMUSCULAR | Status: DC | PRN

## 2012-01-09 MED ORDER — ONDANSETRON HCL 4 MG PO TABS: 4.0000 mg | ORAL_TABLET | Freq: Four times a day (QID) | ORAL | Status: DC | PRN

## 2012-01-09 MED ORDER — ROCURONIUM BROMIDE 100 MG/10ML IV SOLN
INTRAVENOUS | Status: DC | PRN
  Administered 2012-01-09: 50 mg via INTRAVENOUS

## 2012-01-09 MED ORDER — FENTANYL CITRATE 0.05 MG/ML IJ SOLN
INTRAMUSCULAR | Status: AC
  Filled 2012-01-09: qty 2

## 2012-01-09 MED ORDER — PHENYLEPHRINE HCL 10 MG/ML IJ SOLN
10.0000 mg | INTRAVENOUS | Status: DC | PRN
  Administered 2012-01-09: 10 ug/min via INTRAVENOUS

## 2012-01-09 MED ORDER — PROPOFOL 10 MG/ML IV EMUL
INTRAVENOUS | Status: DC | PRN
  Administered 2012-01-09: 150 mg via INTRAVENOUS

## 2012-01-09 MED ORDER — POTASSIUM CHLORIDE IN NACL 40-0.9 MEQ/L-% IV SOLN
INTRAVENOUS | Status: DC
  Administered 2012-01-09 – 2012-01-10 (×2): via INTRAVENOUS
  Filled 2012-01-09 (×4): qty 1000

## 2012-01-09 MED ORDER — SODIUM CHLORIDE 0.9 % IR SOLN
Status: DC | PRN
  Administered 2012-01-09: 1000 mL

## 2012-01-09 MED ORDER — HYDROCHLOROTHIAZIDE 12.5 MG PO CAPS
12.5000 mg | ORAL_CAPSULE | Freq: Every day | ORAL | Status: DC
  Administered 2012-01-09: 12.5 mg via ORAL
  Filled 2012-01-09 (×2): qty 1

## 2012-01-09 MED ORDER — VITAMIN D (ERGOCALCIFEROL) 1.25 MG (50000 UNIT) PO CAPS
50000.0000 [IU] | ORAL_CAPSULE | ORAL | Status: DC
  Administered 2012-01-09: 50000 [IU] via ORAL
  Filled 2012-01-09: qty 1

## 2012-01-09 MED ORDER — PANTOPRAZOLE SODIUM 40 MG PO TBEC
40.0000 mg | DELAYED_RELEASE_TABLET | Freq: Every day | ORAL | Status: DC
  Filled 2012-01-09: qty 1

## 2012-01-09 MED ORDER — LIDOCAINE HCL (CARDIAC) 20 MG/ML IV SOLN
INTRAVENOUS | Status: DC | PRN
  Administered 2012-01-09: 100 mg via INTRAVENOUS

## 2012-01-09 MED ORDER — PHENOL 1.4 % MT LIQD: 1.0000 | OROMUCOSAL | Status: DC | PRN

## 2012-01-09 MED ORDER — METOCLOPRAMIDE HCL 10 MG PO TABS: 5.0000 mg | ORAL_TABLET | Freq: Three times a day (TID) | ORAL | Status: DC | PRN

## 2012-01-09 MED ORDER — ALUM & MAG HYDROXIDE-SIMETH 200-200-20 MG/5ML PO SUSP: 30.0000 mL | ORAL | Status: DC | PRN

## 2012-01-09 MED ORDER — CEFAZOLIN SODIUM-DEXTROSE 2-3 GM-% IV SOLR
2.0000 g | Freq: Four times a day (QID) | INTRAVENOUS | Status: AC
  Administered 2012-01-09 – 2012-01-10 (×3): 2 g via INTRAVENOUS
  Filled 2012-01-09 (×3): qty 50

## 2012-01-09 MED ORDER — LOSARTAN POTASSIUM-HCTZ 100-12.5 MG PO TABS: 1.0000 | ORAL_TABLET | Freq: Every day | ORAL | Status: DC

## 2012-01-09 MED ORDER — ACETAMINOPHEN 650 MG RE SUPP: 650.0000 mg | Freq: Four times a day (QID) | RECTAL | Status: DC | PRN

## 2012-01-09 MED ORDER — METOCLOPRAMIDE HCL 5 MG/ML IJ SOLN
10.0000 mg | Freq: Once | INTRAMUSCULAR | Status: AC | PRN
  Administered 2012-01-09: 10 mg via INTRAVENOUS
  Filled 2012-01-09: qty 2

## 2012-01-09 MED ORDER — MIDAZOLAM HCL 2 MG/2ML IJ SOLN
1.0000 mg | INTRAMUSCULAR | Status: DC | PRN
  Administered 2012-01-09: 2 mg via INTRAVENOUS

## 2012-01-09 MED ORDER — BUPIVACAINE-EPINEPHRINE PF 0.5-1:200000 % IJ SOLN
INTRAMUSCULAR | Status: DC | PRN
  Administered 2012-01-09: 30 mL

## 2012-01-09 MED ORDER — SIMVASTATIN 20 MG PO TABS
20.0000 mg | ORAL_TABLET | Freq: Every day | ORAL | Status: DC
  Administered 2012-01-09: 20 mg via ORAL
  Filled 2012-01-09 (×2): qty 1

## 2012-01-09 MED ORDER — DIAZEPAM 5 MG PO TABS: 5.0000 mg | ORAL_TABLET | Freq: Four times a day (QID) | ORAL | Status: DC | PRN

## 2012-01-09 MED ORDER — NEOSTIGMINE METHYLSULFATE 1 MG/ML IJ SOLN
INTRAMUSCULAR | Status: DC | PRN
  Administered 2012-01-09: 3 mg via INTRAVENOUS

## 2012-01-09 MED ORDER — ZOLPIDEM TARTRATE 5 MG PO TABS: 5.0000 mg | ORAL_TABLET | Freq: Every evening | ORAL | Status: DC | PRN

## 2012-01-09 MED ORDER — MENTHOL 3 MG MT LOZG: 1.0000 | LOZENGE | OROMUCOSAL | Status: DC | PRN

## 2012-01-09 MED ORDER — ACETAMINOPHEN 325 MG PO TABS: 650.0000 mg | ORAL_TABLET | Freq: Four times a day (QID) | ORAL | Status: DC | PRN

## 2012-01-09 MED ORDER — FENTANYL CITRATE 0.05 MG/ML IJ SOLN
50.0000 ug | INTRAMUSCULAR | Status: DC | PRN
  Administered 2012-01-09: 100 ug via INTRAVENOUS

## 2012-01-09 MED ORDER — LACTATED RINGERS IV SOLN
INTRAVENOUS | Status: DC | PRN
  Administered 2012-01-09: 50 mL/h
  Administered 2012-01-09: 14:00:00 via INTRAVENOUS

## 2012-01-09 MED ORDER — DOCUSATE SODIUM 100 MG PO CAPS
100.0000 mg | ORAL_CAPSULE | Freq: Two times a day (BID) | ORAL | Status: DC
  Administered 2012-01-09: 100 mg via ORAL
  Filled 2012-01-09 (×2): qty 1

## 2012-01-09 MED ORDER — FUROSEMIDE 80 MG PO TABS
80.0000 mg | ORAL_TABLET | Freq: Every day | ORAL | Status: DC
Start: 2012-01-09 — End: 2012-01-10
  Filled 2012-01-09 (×2): qty 1

## 2012-01-09 MED ORDER — LOSARTAN POTASSIUM 50 MG PO TABS
100.0000 mg | ORAL_TABLET | Freq: Every day | ORAL | Status: DC
  Filled 2012-01-09: qty 2

## 2012-01-09 MED ORDER — ONDANSETRON HCL 4 MG/2ML IJ SOLN: 4.0000 mg | Freq: Four times a day (QID) | INTRAMUSCULAR | Status: DC | PRN

## 2012-01-09 MED ORDER — MUPIROCIN 2 % EX OINT
TOPICAL_OINTMENT | Freq: Two times a day (BID) | CUTANEOUS | Status: DC
  Filled 2012-01-09 (×2): qty 22

## 2012-01-09 MED ORDER — MEPERIDINE HCL 25 MG/ML IJ SOLN: 6.2500 mg | INTRAMUSCULAR | Status: DC | PRN

## 2012-01-09 MED ORDER — HYDROCHLOROTHIAZIDE 25 MG PO TABS
25.0000 mg | ORAL_TABLET | Freq: Every day | ORAL | Status: DC
  Filled 2012-01-09: qty 1

## 2012-01-09 MED ORDER — DIPHENHYDRAMINE HCL 12.5 MG/5ML PO ELIX: 12.5000 mg | ORAL_SOLUTION | ORAL | Status: DC | PRN

## 2012-01-09 MED ORDER — MORPHINE SULFATE 2 MG/ML IJ SOLN: 2.0000 mg | INTRAMUSCULAR | Status: DC | PRN

## 2012-01-09 MED ORDER — MUPIROCIN 2 % EX OINT
TOPICAL_OINTMENT | Freq: Once | CUTANEOUS | Status: AC
  Administered 2012-01-09: 1 via NASAL
  Filled 2012-01-09: qty 22

## 2012-01-09 MED ORDER — LOSARTAN POTASSIUM 50 MG PO TABS
100.0000 mg | ORAL_TABLET | Freq: Every day | ORAL | Status: DC
  Administered 2012-01-09: 100 mg via ORAL
  Filled 2012-01-09 (×2): qty 2

## 2012-01-09 MED ORDER — ONDANSETRON HCL 4 MG/2ML IJ SOLN
INTRAMUSCULAR | Status: DC | PRN
  Administered 2012-01-09: 4 mg via INTRAVENOUS

## 2012-01-09 MED ORDER — DEXAMETHASONE SODIUM PHOSPHATE 4 MG/ML IJ SOLN
INTRAMUSCULAR | Status: DC | PRN
  Administered 2012-01-09: 4 mg

## 2012-01-09 MED ORDER — AMLODIPINE BESYLATE 5 MG PO TABS
5.0000 mg | ORAL_TABLET | Freq: Every day | ORAL | Status: DC
  Filled 2012-01-09: qty 1

## 2012-01-09 MED ORDER — METHOCARBAMOL 750 MG PO TABS: 750.0000 mg | ORAL_TABLET | Freq: Four times a day (QID) | ORAL | Status: DC | PRN

## 2012-01-09 SURGICAL SUPPLY — 56 items
BLADE SAW SAG 73X25 THK (BLADE)
BLADE SAW SGTL 73X25 THK (BLADE) ×1 IMPLANT
BOWL SMART MIX CTS (DISPOSABLE) IMPLANT
CHLORAPREP W/TINT 26ML (MISCELLANEOUS) ×2 IMPLANT
CLOTH BEACON ORANGE TIMEOUT ST (SAFETY) ×2 IMPLANT
COVER SURGICAL LIGHT HANDLE (MISCELLANEOUS) ×2 IMPLANT
DRAPE INCISE IOBAN 66X45 STRL (DRAPES) ×2 IMPLANT
DRAPE U-SHAPE 47X51 STRL (DRAPES) ×2 IMPLANT
DRSG ADAPTIC 3X8 NADH LF (GAUZE/BANDAGES/DRESSINGS) ×1 IMPLANT
DRSG PAD ABDOMINAL 8X10 ST (GAUZE/BANDAGES/DRESSINGS) ×2 IMPLANT
ELECT REM PT RETURN 9FT ADLT (ELECTROSURGICAL) ×2
ELECTRODE REM PT RTRN 9FT ADLT (ELECTROSURGICAL) ×1 IMPLANT
EVACUATOR 1/8 PVC DRAIN (DRAIN) IMPLANT
GLOVE BIO SURGEON STRL SZ7 (GLOVE) ×2 IMPLANT
GLOVE BIO SURGEON STRL SZ7.5 (GLOVE) ×2 IMPLANT
GLOVE BIOGEL PI IND STRL 8 (GLOVE) ×1 IMPLANT
GLOVE BIOGEL PI INDICATOR 8 (GLOVE) ×1
GOWN PREVENTION PLUS LG XLONG (DISPOSABLE) ×1 IMPLANT
GOWN STRL NON-REIN LRG LVL3 (GOWN DISPOSABLE) ×2 IMPLANT
HEMOSTAT SURGICEL 2X14 (HEMOSTASIS) IMPLANT
KIT BASIN OR (CUSTOM PROCEDURE TRAY) ×2 IMPLANT
KIT ROOM TURNOVER OR (KITS) ×2 IMPLANT
MANIFOLD NEPTUNE II (INSTRUMENTS) ×2 IMPLANT
NDL 1/2 CIR MAYO (NEEDLE) ×1 IMPLANT
NDL HYPO 25GX1X1/2 BEV (NEEDLE) ×1 IMPLANT
NDL SUT .5 MAYO 1.404X.05X (NEEDLE) ×1 IMPLANT
NEEDLE 1/2 CIR MAYO (NEEDLE) ×2 IMPLANT
NEEDLE HYPO 25GX1X1/2 BEV (NEEDLE) IMPLANT
NEEDLE MAYO TAPER (NEEDLE)
NS IRRIG 1000ML POUR BTL (IV SOLUTION) ×2 IMPLANT
PACK SHOULDER (CUSTOM PROCEDURE TRAY) ×2 IMPLANT
PAD ARMBOARD 7.5X6 YLW CONV (MISCELLANEOUS) ×4 IMPLANT
RETRIEVER SUT HEWSON (MISCELLANEOUS) IMPLANT
SLING ARM IMMOBILIZER LRG (SOFTGOODS) ×1 IMPLANT
SLING ARM IMMOBILIZER MED (SOFTGOODS) IMPLANT
SPONGE GAUZE 4X4 12PLY (GAUZE/BANDAGES/DRESSINGS) ×1 IMPLANT
SPONGE LAP 18X18 X RAY DECT (DISPOSABLE) ×2 IMPLANT
STRIP CLOSURE SKIN 1/2X4 (GAUZE/BANDAGES/DRESSINGS) ×1 IMPLANT
SUCTION FRAZIER TIP 10 FR DISP (SUCTIONS) ×2 IMPLANT
SUPPORT WRAP ARM LG (MISCELLANEOUS) ×1 IMPLANT
SUT FIBERWIRE #2 38 T-5 BLUE (SUTURE)
SUT MNCRL AB 4-0 PS2 18 (SUTURE) ×1 IMPLANT
SUT SILK 2 0 TIES 17X18 (SUTURE)
SUT SILK 2-0 18XBRD TIE BLK (SUTURE) ×1 IMPLANT
SUT VIC AB 0 CTB1 27 (SUTURE) ×2 IMPLANT
SUT VIC AB 2-0 CT1 27 (SUTURE) ×4
SUT VIC AB 2-0 CT1 TAPERPNT 27 (SUTURE) ×3 IMPLANT
SUTURE FIBERWR #2 38 T-5 BLUE (SUTURE) ×8 IMPLANT
SWABSTICK BENZOIN STERILE (MISCELLANEOUS) ×1 IMPLANT
SYR CONTROL 10ML LL (SYRINGE) ×1 IMPLANT
TAPE CLOTH SURG 6X10 WHT LF (GAUZE/BANDAGES/DRESSINGS) ×1 IMPLANT
TOWEL OR 17X24 6PK STRL BLUE (TOWEL DISPOSABLE) ×2 IMPLANT
TOWEL OR 17X26 10 PK STRL BLUE (TOWEL DISPOSABLE) ×1 IMPLANT
TRAY FOLEY CATH 14FR (SET/KITS/TRAYS/PACK) IMPLANT
WATER STERILE IRR 1000ML POUR (IV SOLUTION) ×1 IMPLANT
YANKAUER SUCT BULB TIP NO VENT (SUCTIONS) ×2 IMPLANT

## 2012-01-09 NOTE — Anesthesia Postprocedure Evaluation (Signed)
Anesthesia Post Note  Patient: [name]  Procedure(s) Performed: Procedure(s) (LRB): ROTATOR CUFF REPAIR SHOULDER OPEN (Right)  Anesthesia type: General  Patient location: PACU  Post pain: Pain level controlled and Adequate analgesia  Post assessment: Post-op Vital signs reviewed, Patient's Cardiovascular Status Stable, Respiratory Function Stable, Patent Airway and Pain level controlled  Last Vitals:  Filed Vitals:   01/09/12 1605  BP:   Pulse: 78  Temp: 36.6 C  Resp: 19    Post vital signs: Reviewed and stable  Level of consciousness: awake, alert  and oriented  Complications: No apparent anesthesia complications

## 2012-01-09 NOTE — Anesthesia Preprocedure Evaluation (Addendum)
Anesthesia Evaluation  Patient identified by MRN, date of birth, ID band Patient awake    Reviewed: Allergy & Precautions, H&P , NPO status , Patient's Chart, lab work & pertinent test results  History of Anesthesia Complications (+) PROLONGED EMERGENCE  Airway Mallampati: III TM Distance: >3 FB Neck ROM: Full    Dental   Pulmonary neg pulmonary ROS,  breath sounds clear to auscultation  Pulmonary exam normal       Cardiovascular hypertension, Pt. on medications +CHF Rhythm:Regular Rate:Normal     Neuro/Psych Anxiety negative neurological ROS  negative psych ROS   GI/Hepatic Neg liver ROS, GERD-  Medicated and Controlled,  Endo/Other  Morbid obesity  Renal/GU negative Renal ROS  negative genitourinary   Musculoskeletal negative musculoskeletal ROS (+) Arthritis -, Osteoarthritis,    Abdominal (+) + obese,   Peds  Hematology negative hematology ROS (+)   Anesthesia Other Findings   Reproductive/Obstetrics negative OB ROS                          Anesthesia Physical Anesthesia Plan  ASA: III  Anesthesia Plan: General   Post-op Pain Management:    Induction: Intravenous  Airway Management Planned: Oral ETT  Additional Equipment:   Intra-op Plan:   Post-operative Plan: Extubation in OR  Informed Consent: I have reviewed the patients History and Physical, chart, labs and discussed the procedure including the risks, benefits and alternatives for the proposed anesthesia with the patient or authorized representative who has indicated his/her understanding and acceptance.   Dental advisory given  Plan Discussed with: CRNA, Surgeon and Anesthesiologist  Anesthesia Plan Comments:        Anesthesia Quick Evaluation

## 2012-01-09 NOTE — Preoperative (Signed)
Beta Blockers   Reason not to administer Beta Blockers:Not Applicable 

## 2012-01-09 NOTE — Anesthesia Procedure Notes (Addendum)
Anesthesia Regional Block:  Interscalene brachial plexus block  Pre-Anesthetic Checklist: ,, timeout performed, Correct Patient, Correct Site, Correct Laterality, Correct Procedure, Correct Position, site marked, Risks and benefits discussed,  Surgical consent,  Pre-op evaluation,  At surgeon's request and post-op pain management  Laterality: Right  Prep: chloraprep       Needles:  Injection technique: Single-shot  Needle Type: Stimiplex     Needle Length: 5cm 5 cm Needle Gauge: 20 and 20 G  Needle insertion depth: 2 cm   Additional Needles:  Procedures: ultrasound guided and nerve stimulator Interscalene brachial plexus block  Nerve Stimulator or Paresthesia:  Response: Right wrist/Arm, 20 mA, 2 cm  Additional Responses:   Narrative:  Start time: 01/09/2012 12:35 PM End time: 01/09/2012 12:45 PM  Performed by: Personally  Anesthesiologist: Achille Rich, MD  Additional Notes: Patient tolerated procedure well.   Procedure Name: Intubation Date/Time: 01/09/2012 1:04 PM Performed by: Rossie Muskrat L Pre-anesthesia Checklist: Patient identified, Timeout performed, Emergency Drugs available, Suction available and Patient being monitored Patient Re-evaluated:Patient Re-evaluated prior to inductionOxygen Delivery Method: Circle system utilized Preoxygenation: Pre-oxygenation with 100% oxygen Intubation Type: IV induction Ventilation: Mask ventilation without difficulty Laryngoscope Size: Miller and 2 Grade View: Grade I Tube type: Oral Tube size: 7.0 mm Number of attempts: 1 Airway Equipment and Method: Stylet Placement Confirmation: ETT inserted through vocal cords under direct vision,  breath sounds checked- equal and bilateral and positive ETCO2 Secured at: 21 cm Tube secured with: Tape Dental Injury: Teeth and Oropharynx as per pre-operative assessment
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
## 2012-01-09 NOTE — Plan of Care (Signed)
Problem: Consults Goal: Rotator Cuff Repair Patient Education See Patient Education Module for education specifics. Outcome: Completed/Met Date Met:  01/09/12 See Op Note for Surgical procedure  Problem: Phase I Progression Outcomes Goal: OT evaluation for ADLs discussed Outcome: Not Applicable Date Met:  01/09/12 Not ordered

## 2012-01-09 NOTE — Op Note (Signed)
Procedure(s): ROTATOR CUFF REPAIR SHOULDER OPEN Procedure Note  [name] female 61 y.o. 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 female  who had a right total shoulder replacement nearly 3 months ago. She was noted to have subscapularis failure clinically and verified by CT scan. She was indicated for attempted repair to try and maximize her outcome. She understood potential risks benefits and alternatives to the procedure including but not limited to infection bleeding damage to neurovascular structures and the possibility that they tear would no longer be repairable. She elected to go forward with surgery.     Surgeon: Mable Paris   Assistants: Skip Mayer PA-C [facility] assistance was essential during the case for retraction in arm positioning.  Anesthesia: General endotracheal anesthesia and interscalene regional    Procedure Detail  ROTATOR CUFF REPAIR SHOULDER OPEN  Findings: The lesser tuberosity osteotomy was found medially at the level of the glenoid. The tendon was attempted to be mobilized but the lesser tuberosity fragment easily tore away from the tendon. Every attempt to mobilize the tendon resulted in friable tissue fragment thing. No viable tendon was able to be even partially repaired.  Estimated Blood Loss:  200 mL         Drains: 1 medium hemovac  Blood Given: none          Specimens: none        Complications:  * No complications entered in OR log *         Disposition: PACU - hemodynamically stable.         Condition: stable    Procedure:   The patient was identified in the  preoperative holding area where I personally marked the operative extremity after verifying with the patient and consent. She  was taken to the operating room where She was transferred to the   operative table.  The patient received an interscalene block in   the holding area by the attending anesthesiologist.  General anesthesia was induced   in the operating room without complication.  The patient did receive IV  Ancef prior to the commencement of the procedure.  The patient was   placed in the beach-chair position with the back raised about 30   degrees.  The nonoperative extremity and head and neck were carefully   positioned and padded protecting against neurovascular compromise.  The   left upper extremity was then prepped and draped in the standard sterile   fashion.    The appropriate operative time-out was performed with   Anesthesia, the perioperative staff, as well as myself and we all agreed   that the right side was the correct operative site.  An approximately   10 cm incision was made from the tip of the coracoid to the center point of the   humerus at the level of the axilla and the same previous scar. Dissection was carried down through her extensive subcutaneous fat to the level of the deltopectoral interval which was developed.  There was extensive scarring. The  pectoralis major was carefully dissected away from the conjoined tendon. The deltoid was elevated laterally and the subdeltoid space was carefully  developed. She had developed extensive adhesions and scar tissue already. The undersurface of the conjoined tendon was then carefully developed and a blunt right angle retractor was placed underneath the conjoined tendon. The tissue in the anterior humeral head was carefully dissected. There is no Stout tendon at the level of the lesser tuberosity. The sutures were intact from her previous repair. These were discarded. Once the scar tissue over the anterior humerus was removed  the subscapularis was found to be absent. With careful retraction of the conjoined tendon the loose bony fragment in the inferior aspect of the joint was identified and removed. This was about 10 x 15 mm and smooth and corticated. The stem was completely intact and not loose. The glenoid component was intact and not loose. The lesser tuberosity osteotomy was identified at about the level of the glenoid. This was attempted to be mobilized but quickly tore away from any soft tissue attachments. Was set aside. The remaining subscapularis was carefully dissected and attempted to be mobilized. The tissue is extremely friable and of very poor quality. I was unable to adequately mobilize any tissue over the anterior joint. I spent greater than 45 minutes attempting to identify and mobilize this tissue without success. Therefore all suture material was removed the joint was copiously irrigated with normal saline. The decision was made at that point to simply close the wound. I did not feel that any augmentation of the anterior joint with a allograft would be appropriate at this time as her main issue is not instability but rather stiffness and dysfunction. I felt this would only add to her problems at this point. Therefore after the wound was copiously irrigated with normal saline and closed in layers with 2-0 Vicryl and staples for skin closure. Sterile dressings were applied including Adaptic 4 x 4's ABDs and tape. The patient was placed in a sling allowed to awaken from anesthesia transferred to the stretcher and taken to recovery room in stable condition.    POSTOPERATIVE PLAN:  She will be kept in a sling postoperatively and will begin some pendulum exercises and hand wrist and elbow motion. She will be kept overnight one night for pain control and observation.

## 2012-01-09 NOTE — Transfer of Care (Signed)
Immediate Anesthesia Transfer of Care Note  Patient: [name]  Procedure(s) Performed: Procedure(s) (LRB): ROTATOR CUFF REPAIR SHOULDER OPEN (Right)  Patient Location: PACU  Anesthesia Type: General  Level of Consciousness: awake, alert  and oriented  Airway & Oxygen Therapy: Patient Spontanous Breathing and Patient connected to nasal cannula oxygen  Post-op Assessment: Report given to PACU RN, Post -op Vital signs reviewed and stable and Patient moving all extremities X 4  Post vital signs: Reviewed and stable  Complications: No apparent anesthesia complications

## 2012-01-10 LAB — CBC
HCT: 37 % (ref 36.0–46.0)
MCH: 28.2 pg (ref 26.0–34.0)
MCHC: 33 g/dL (ref 30.0–36.0)
MCV: 85.5 fL (ref 78.0–100.0)
Platelets: 225 10*3/uL (ref 150–400)
RDW: 13.7 % (ref 11.5–15.5)
WBC: 7 10*3/uL (ref 4.0–10.5)

## 2012-01-10 MED ORDER — OXYCODONE-ACETAMINOPHEN 5-325 MG PO TABS: 1.0000 | ORAL_TABLET | ORAL | Status: AC | PRN

## 2012-01-10 NOTE — Discharge Summary (Signed)
Patient ID: [name] MRN: [id number] DOB/AGE: 63-Feb-1952 63 y.o.  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 Complication of anesthesia     @ [place].....COULDN'T GET HER AWAKE....FOR  HERNIA SURGERY... PLACED ON VENTILATOR  . CHF (congestive heart failure)     [name].......LAST OFFICE VISIT WAS A FEW AGO  . Hypertension   . Bronchitis     USES NEB  AS NEEDED  . GERD (gastroesophageal reflux disease)     TAKES PRILOSEC BID  . Arthritis   . Anxiety     ANXIETY ONCE....PLACED ON CELEXA  . 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 IBS (irritable bowel syndrome)   . Hypercholesteremia     Surgeries: Procedure(s): ROTATOR CUFF REPAIR SHOULDER OPEN , removal loose bodies on February 01, 2012   Consultants:    Discharged Condition: Improved  Hospital Course: [name] is an 61 y.o. female who was admitted 02/01/2012 for operative treatment of R shoulder subscap failure and loose bodies after TSA.  After pre-op clearance the patient was taken to the operating room on Feb 01, 2012 and underwent  Procedure(s): ROTATOR CUFF REPAIR SHOULDER OPEN.    Patient was given perioperative antibiotics: Anti-infectives     Start     Dose/Rate Route Frequency Ordered Stop   01-Feb-2012 1800   ceFAZolin (ANCEF) IVPB 2 g/50 mL premix        2 g 100 mL/hr over 30 Minutes Intravenous Every 6 hours 02/01/12 1657 01/10/12 0617   01/08/12 1255   ceFAZolin (ANCEF) IVPB 2 g/50 mL premix        2 g 100 mL/hr over 30 Minutes Intravenous 60 min pre-op 01/08/12 1255 02/01/2012 1308           Patient was given sequential compression devices, early ambulation, to prevent DVT.  Patient benefited maximally from hospital stay and there were no complications.    Recent vital signs: Patient Vitals for the past 24 hrs:  BP Temp  Temp src Pulse Resp SpO2  01/10/12 0503 133/70 mmHg 98 F (36.7 C) - 79  16  94 %  01/10/12 0055 120/64 mmHg 98.2 F (36.8 C) - 88  16  99 %  February 01, 2012 2034 117/67 mmHg 98.9 F (37.2 C) - 91  16  98 %  February 01, 2012 1620 163/82 mmHg 97.5 F (36.4 C) - 76  16  96 %  02/01/12 1605 - 97.8 F (36.6 C) - 78  19  96 %  2012-02-01 1600 140/72 mmHg - - 78  19  96 %  01-Feb-2012 1545 147/75 mmHg - - - - -  02-01-12 1538 - - - 82  19  98 %  01-Feb-2012 1530 144/62 mmHg - - 84  21  97 %  February 01, 2012 1515 152/68 mmHg 98.2 F (36.8 C) - 86  19  97 %  02/01/12 0955 129/83 mmHg 98.7 F (37.1 C) Oral 69  18  99 %     Recent laboratory studies:  Basename 01/10/12 0635 Feb 01, 2012 1011  WBC 7.0 5.2  HGB 12.2 13.4  HCT 37.0 40.1  PLT 225 229  NA -- 143  K -- 3.0*  CL -- 103  CO2 -- 27  BUN -- 6  CREATININE -- 0.83  GLUCOSE -- 102*  INR -- --  CALCIUM -- 9.1  Discharge Medications:   Medication List  As of 01/10/2012  7:57 AM   TAKE these medications         amLODipine 5 MG tablet   Commonly known as: NORVASC   Take 5 mg by mouth daily.      citalopram 20 MG tablet   Commonly known as: CELEXA   Take 20 mg by mouth daily.      diazepam 5 MG tablet   Commonly known as: VALIUM   Take 5 mg by mouth every 6 (six) hours as needed. For muscle spasm.      furosemide 80 MG tablet   Commonly known as: LASIX   Take 80 mg by mouth daily.      losartan-hydrochlorothiazide 100-12.5 MG per tablet   Commonly known as: HYZAAR   Take 1 tablet by mouth daily.      methocarbamol 750 MG tablet   Commonly known as: ROBAXIN   Take 750 mg by mouth 4 (four) times daily as needed. For spasms      omeprazole 20 MG capsule   Commonly known as: PRILOSEC   Take 20 mg by mouth 2 (two) times daily.      oxyCODONE-acetaminophen 5-325 MG per tablet   Commonly known as: PERCOCET   Take 1 tablet by mouth every 4 (four) hours as needed. pain      oxyCODONE-acetaminophen 5-325 MG per tablet   Commonly known as:  PERCOCET   Take 1-2 tablets by mouth every 4 (four) hours as needed for pain.      potassium chloride SA 20 MEQ tablet   Commonly known as: K-DUR,KLOR-CON   Take 20 mEq by mouth 2 (two) times daily.      simvastatin 20 MG tablet   Commonly known as: ZOCOR   Take 20 mg by mouth every evening.      Vitamin D (Ergocalciferol) 50000 UNITS Caps   Commonly known as: DRISDOL   Take 50,000 Units by mouth every 7 (seven) days. On Wednesday            Diagnostic Studies: Dg Chest 2 View  01/09/2012  *RADIOLOGY REPORT*  Clinical Data: Preoperative assessment for right shoulder surgery, history hypertension, bronchitis, GERD  CHEST - 2 VIEW  Comparison: 01/03/2011  Findings: Normal heart size, mediastinal contours, and pulmonary vascularity. Lungs clear. No pleural effusion or pneumothorax. Scattered end plate spur formation thoracic spine. Bilateral AC joint degenerative changes. Prior right shoulder joint replacement.  IMPRESSION: No acute abnormalities.  Original Report Authenticated By: Lollie Marrow, M.D.   Dg Shoulder Right  12/22/2011  *RADIOLOGY REPORT*  Clinical Data: 2 months post right shoulder arthroplasty, presenting with right shoulder pain after an audible pop.  RIGHT SHOULDER - 2+ VIEW 12/22/2011:  Comparison: Post-operative right shoulder image 10/22/2011 and right shoulder x-rays 11/06/2006.  Findings: Ossific density just inferior to the humeral head, not visualized on the immediate post-operative image.  No fractures elsewhere.  Anatomic alignment of the glenohumeral joint.  Severe degenerative changes involving the acromioclavicular joint.  IMPRESSION: Ossific density adjacent to the inferior humeral head, not visualized on the immediate postoperative image 10/22/2011, query interval avulsion versus myositis ossificans.  Anatomic alignment the glenohumeral joint.  Original Report Authenticated By: Arnell Sieving, M.D.   Ct Shoulder Right Wo Contrast  01/02/2012  *RADIOLOGY  REPORT*  Clinical Data: Right shoulder pain.  Total shoulder replacement 2 months ago.  CT OF THE RIGHT SHOULDER WITHOUT CONTRAST  Technique:  Multidetector CT imaging was performed  according to the standard protocol. Multiplanar CT image reconstructions were also generated.  Comparison: 12/22/2011  Findings: Technical factors related to patient body habitus reduce diagnostic sensitivity and specificity.  Streak artifact from the humeral implant is noted.  A flat 1.3 x 0.4 x 0.9 cm calcific lesion is observed in the expected vicinity of the biceps recess.  There is equivocal calcification adjacent to the anterior rim of the glenoid on images 30-36 of series 2, as well as a 0.6 cm calcific structure below the anterior inferior glenoid rim along the inferior subscapularis margin.  Degenerative AC joint arthropathy noted.  A donor site for the calcific fragments is difficult to be sure of given the degree of cortical ill-definition from the streak artifact, especially in the humeral head region.  There is low cortical definition along the lesser tuberosity, for example on images 33-39 of series 2, which could be a donor site.  IMPRESSION:  1.  Small probable free fragments in the bicipital recess and anterior inferior portion of the joint.  These may have simply been occult on the immediate postoperative radiograph due to the degree of internal rotation and angulation rather than necessarily being new.  Donor site is uncertain, although there is poor definition of the lesser tuberosity which could possibly reflect a donor site. No definite fatty atrophy of the rotator cuff musculature is observed. 2.  Degenerative AC joint arthropathy.  Original Report Authenticated By: Dellia Cloud, M.D.    Disposition: 01-Home or Self Care  Discharge Orders    Future Orders Please Complete By Expires   Diet - low sodium heart healthy      Call MD / Call 911      Comments:   If you experience chest pain or shortness  of breath, CALL 911 and be transported to the hospital emergency room.  If you develope a fever above 101 F, pus (white drainage) or increased drainage or redness at the wound, or calf pain, call your surgeon's office.   Constipation Prevention      Comments:   Drink plenty of fluids.  Prune juice may be helpful.  You may use a stool softener, such as Colace (over the counter) 100 mg twice a day.  Use MiraLax (over the counter) for constipation as needed.   Increase activity slowly as tolerated      Driving restrictions      Comments:   No driving for 2 weeks      Follow-up Information    Follow up with Mable Paris, MD in 2 weeks.   Contact information:   [facility] [address], [address] [place] [address] [phone]           Signed: Mable Paris 01/10/2012, 7:57 AM

## 2012-01-10 NOTE — Progress Notes (Signed)
Utilization review completed. Nataley Bahri, RN, BSN.  01/10/12 

## 2012-01-10 NOTE — Progress Notes (Signed)
PATIENT ID: [name]  MRN: [id number]  DOB/AGE:  11-12-1950 / 61 y.o.  1 Day Post-Op Procedure(s) (LRB): ROTATOR CUFF REPAIR SHOULDER OPEN (Right)  Subjective: Pain is mild.  No c/o chest pain or SOB.      Objective: Vital signs in last 24 hours: Temp:  [97.5 F (36.4 C)-98.9 F (37.2 C)] 98 F (36.7 C) (05/24 0503) Pulse Rate:  [69-91] 79  (05/24 0503) Resp:  [16-21] 16  (05/24 0503) BP: (117-163)/(62-83) 133/70 mmHg (05/24 0503) SpO2:  [94 %-99 %] 94 % (05/24 0503)  Intake/Output from previous day: 05/23 0701 - 05/24 0700 In: 3090 [P.O.:120; I.V.:2870; IV Piggyback:100] Out: -  Intake/Output this shift:     Basename 01/10/12 0635 01/09/12 1011  HGB 12.2 13.4    Basename 01/10/12 0635 01/09/12 1011  WBC 7.0 5.2  RBC 4.33 4.72  HCT 37.0 40.1  PLT 225 229    Basename 01/09/12 1011  NA 143  K 3.0*  CL 103  CO2 27  BUN 6  CREATININE 0.83  GLUCOSE 102*  CALCIUM 9.1   No results found for this basename: LABPT:2,INR:2 in the last 72 hours  Physical Exam: Neurovascular intact Sensation intact distally Intact pulses distally Incision: dressing C/D/I  Assessment/Plan: 1 Day Post-Op Procedure(s) (LRB): ROTATOR CUFF REPAIR SHOULDER OPEN (Right)   D/c home today F/u 2 wks   Shasta Chinn WILLIAM 01/10/2012, 7:53 AM

## 2012-01-10 NOTE — Discharge Instructions (Signed)
Discharge Instructions after Open Shoulder Repair ° °A sling has been provided for you. Remain in your sling at all times. This includes sleeping in your sling.  °Use ice on the shoulder intermittently over the first 48 hours after surgery.  °Pain medicine has been prescribed for you.  °Use your medicine liberally over the first 48 hours, and then you can begin to taper your use. You may take Extra Strength Tylenol or Tylenol only in place of the pain pills. DO NOT take ANY nonsteroidal anti-inflammatory pain medications: Advil, Motrin, Ibuprofen, Aleve, Naproxen or Naprosyn.  °You may remove your dressing after two days  °You may shower 5 days after surgery. The incisions CANNOT get wet prior to 5 days. Simply allow the water to wash over the site and then pat dry. Do not rub the incisions. Make sure your axilla (armpit) is completely dry after showering.  °Take one aspirin, a day for 2 weeks after surgery, unless you have an aspirin sensitivity/ allergy or asthma. ° ° °Please call [phone] during normal business hours or [phone] after hours for any problems. Including the following: ° °- excessive redness of the incisions °- drainage for more than 4 days °- fever of more than 101.5 F ° °*Please note that pain medications will not be refilled after hours or on weekends. ° ° °

## 2012-01-14 ENCOUNTER — Encounter (HOSPITAL_COMMUNITY): Payer: Self-pay | Admitting: Orthopedic Surgery

## 2012-01-14 ENCOUNTER — Encounter: Payer: Medicaid Other | Admitting: Physical Therapy

## 2012-01-16 ENCOUNTER — Encounter: Payer: Medicaid Other | Admitting: Physical Therapy

## 2012-01-31 ENCOUNTER — Ambulatory Visit: Payer: Medicaid Other | Attending: Orthopedic Surgery | Admitting: Physical Therapy

## 2012-01-31 DIAGNOSIS — M25519 Pain in unspecified shoulder: Secondary | ICD-10-CM | POA: Insufficient documentation

## 2012-01-31 DIAGNOSIS — IMO0001 Reserved for inherently not codable concepts without codable children: Secondary | ICD-10-CM | POA: Insufficient documentation

## 2012-01-31 DIAGNOSIS — M25619 Stiffness of unspecified shoulder, not elsewhere classified: Secondary | ICD-10-CM | POA: Insufficient documentation

## 2012-02-04 ENCOUNTER — Ambulatory Visit: Payer: Medicaid Other

## 2012-02-12 ENCOUNTER — Ambulatory Visit: Payer: Medicaid Other | Admitting: Physical Therapy

## 2012-02-14 ENCOUNTER — Ambulatory Visit: Payer: Medicaid Other | Admitting: Physical Therapy

## 2012-02-18 ENCOUNTER — Ambulatory Visit: Payer: Medicaid Other | Attending: Internal Medicine | Admitting: Physical Therapy

## 2012-02-18 DIAGNOSIS — IMO0001 Reserved for inherently not codable concepts without codable children: Secondary | ICD-10-CM | POA: Insufficient documentation

## 2012-02-18 DIAGNOSIS — M25619 Stiffness of unspecified shoulder, not elsewhere classified: Secondary | ICD-10-CM | POA: Insufficient documentation

## 2012-02-18 DIAGNOSIS — M25519 Pain in unspecified shoulder: Secondary | ICD-10-CM | POA: Insufficient documentation

## 2012-02-21 ENCOUNTER — Encounter: Payer: Medicaid Other | Admitting: Physical Therapy

## 2012-02-25 ENCOUNTER — Ambulatory Visit: Payer: Medicaid Other

## 2012-02-26 ENCOUNTER — Ambulatory Visit: Payer: Medicaid Other | Admitting: Physical Therapy

## 2012-02-28 ENCOUNTER — Ambulatory Visit: Payer: Medicaid Other | Admitting: Physical Therapy

## 2012-03-02 ENCOUNTER — Ambulatory Visit: Payer: Medicaid Other | Admitting: Physical Therapy

## 2012-03-04 ENCOUNTER — Ambulatory Visit: Payer: Medicaid Other | Admitting: Physical Therapy

## 2012-03-06 ENCOUNTER — Ambulatory Visit: Payer: Medicaid Other | Admitting: Physical Therapy

## 2012-03-09 ENCOUNTER — Ambulatory Visit: Payer: Medicaid Other | Admitting: Physical Therapy

## 2012-03-11 ENCOUNTER — Ambulatory Visit: Payer: Medicaid Other | Admitting: Physical Therapy

## 2012-03-12 ENCOUNTER — Ambulatory Visit: Payer: Medicaid Other | Admitting: Physical Therapy

## 2012-03-13 ENCOUNTER — Other Ambulatory Visit: Payer: Self-pay | Admitting: Internal Medicine

## 2012-03-13 DIAGNOSIS — Z1231 Encounter for screening mammogram for malignant neoplasm of breast: Secondary | ICD-10-CM

## 2012-03-17 ENCOUNTER — Ambulatory Visit: Payer: Medicaid Other | Admitting: Physical Therapy

## 2012-03-18 ENCOUNTER — Ambulatory Visit: Payer: Medicaid Other | Admitting: Physical Therapy

## 2012-03-24 ENCOUNTER — Ambulatory Visit: Payer: Medicaid Other | Attending: Orthopedic Surgery | Admitting: Physical Therapy

## 2012-03-24 DIAGNOSIS — M25519 Pain in unspecified shoulder: Secondary | ICD-10-CM | POA: Insufficient documentation

## 2012-03-24 DIAGNOSIS — M25619 Stiffness of unspecified shoulder, not elsewhere classified: Secondary | ICD-10-CM | POA: Insufficient documentation

## 2012-03-24 DIAGNOSIS — IMO0001 Reserved for inherently not codable concepts without codable children: Secondary | ICD-10-CM | POA: Insufficient documentation

## 2012-03-26 ENCOUNTER — Ambulatory Visit: Payer: Medicaid Other | Admitting: Physical Therapy

## 2012-03-31 ENCOUNTER — Ambulatory Visit: Payer: Medicaid Other | Admitting: Physical Therapy

## 2012-04-02 ENCOUNTER — Ambulatory Visit: Payer: Medicaid Other | Admitting: Physical Therapy

## 2012-04-03 ENCOUNTER — Ambulatory Visit
Admission: RE | Admit: 2012-04-03 | Discharge: 2012-04-03 | Disposition: A | Discharge status: Home / Self Care | Payer: Medicaid Other | Source: Ambulatory Visit | Attending: Internal Medicine | Admitting: Internal Medicine

## 2012-04-03 DIAGNOSIS — Z1231 Encounter for screening mammogram for malignant neoplasm of breast: Secondary | ICD-10-CM

## 2012-04-07 ENCOUNTER — Ambulatory Visit: Payer: Medicaid Other | Admitting: Physical Therapy

## 2012-04-09 ENCOUNTER — Ambulatory Visit: Payer: Medicaid Other | Admitting: Physical Therapy

## 2012-04-13 ENCOUNTER — Ambulatory Visit: Payer: Medicaid Other | Admitting: Physical Therapy

## 2012-04-15 ENCOUNTER — Ambulatory Visit: Payer: Medicaid Other | Admitting: Physical Therapy

## 2012-04-21 ENCOUNTER — Ambulatory Visit: Payer: Medicaid Other | Attending: Internal Medicine | Admitting: Physical Therapy

## 2012-04-21 DIAGNOSIS — M25519 Pain in unspecified shoulder: Secondary | ICD-10-CM | POA: Insufficient documentation

## 2012-04-21 DIAGNOSIS — M25619 Stiffness of unspecified shoulder, not elsewhere classified: Secondary | ICD-10-CM | POA: Insufficient documentation

## 2012-04-21 DIAGNOSIS — IMO0001 Reserved for inherently not codable concepts without codable children: Secondary | ICD-10-CM | POA: Insufficient documentation

## 2012-07-09 ENCOUNTER — Emergency Department (HOSPITAL_COMMUNITY): Payer: Medicaid Other

## 2012-07-09 ENCOUNTER — Other Ambulatory Visit: Payer: Self-pay

## 2012-07-09 ENCOUNTER — Encounter (HOSPITAL_COMMUNITY): Payer: Self-pay | Admitting: *Deleted

## 2012-07-09 ENCOUNTER — Emergency Department (HOSPITAL_COMMUNITY)
Admission: EM | Admit: 2012-07-09 | Discharge: 2012-07-09 | Disposition: A | Discharge status: Home / Self Care | Payer: Medicaid Other | Attending: Emergency Medicine | Admitting: Emergency Medicine

## 2012-07-09 DIAGNOSIS — R55 Syncope and collapse: Secondary | ICD-10-CM

## 2012-07-09 DIAGNOSIS — I509 Heart failure, unspecified: Secondary | ICD-10-CM | POA: Insufficient documentation

## 2012-07-09 DIAGNOSIS — Z8739 Personal history of other diseases of the musculoskeletal system and connective tissue: Secondary | ICD-10-CM | POA: Insufficient documentation

## 2012-07-09 DIAGNOSIS — R300 Dysuria: Secondary | ICD-10-CM | POA: Insufficient documentation

## 2012-07-09 DIAGNOSIS — F411 Generalized anxiety disorder: Secondary | ICD-10-CM | POA: Insufficient documentation

## 2012-07-09 DIAGNOSIS — K219 Gastro-esophageal reflux disease without esophagitis: Secondary | ICD-10-CM | POA: Insufficient documentation

## 2012-07-09 DIAGNOSIS — R42 Dizziness and giddiness: Secondary | ICD-10-CM | POA: Insufficient documentation

## 2012-07-09 DIAGNOSIS — Z8619 Personal history of other infectious and parasitic diseases: Secondary | ICD-10-CM | POA: Insufficient documentation

## 2012-07-09 DIAGNOSIS — Z79899 Other long term (current) drug therapy: Secondary | ICD-10-CM | POA: Insufficient documentation

## 2012-07-09 DIAGNOSIS — Z8719 Personal history of other diseases of the digestive system: Secondary | ICD-10-CM | POA: Insufficient documentation

## 2012-07-09 DIAGNOSIS — N390 Urinary tract infection, site not specified: Secondary | ICD-10-CM | POA: Insufficient documentation

## 2012-07-09 DIAGNOSIS — I10 Essential (primary) hypertension: Secondary | ICD-10-CM | POA: Insufficient documentation

## 2012-07-09 DIAGNOSIS — E7800 Pure hypercholesterolemia, unspecified: Secondary | ICD-10-CM | POA: Insufficient documentation

## 2012-07-09 LAB — BASIC METABOLIC PANEL
Calcium: 9.7 mg/dL (ref 8.4–10.5)
GFR calc Af Amer: 58 mL/min — ABNORMAL LOW (ref >90)
GFR calc non Af Amer: 50 mL/min — ABNORMAL LOW (ref >90)
Glucose, Bld: 114 mg/dL — ABNORMAL HIGH (ref 70–99)
Potassium: 3.3 mEq/L — ABNORMAL LOW (ref 3.5–5.1)
Sodium: 137 mEq/L (ref 135–145)

## 2012-07-09 LAB — URINE MICROSCOPIC-ADD ON

## 2012-07-09 LAB — URINALYSIS, ROUTINE W REFLEX MICROSCOPIC
Bilirubin Urine: NEGATIVE
Hgb urine dipstick: NEGATIVE
Ketones, ur: NEGATIVE mg/dL
Specific Gravity, Urine: 1.008 (ref 1.005–1.030)
Urobilinogen, UA: 0.2 mg/dL (ref 0.0–1.0)
pH: 5 (ref 5.0–8.0)

## 2012-07-09 LAB — CBC
Hemoglobin: 15.1 g/dL — ABNORMAL HIGH (ref 12.0–15.0)
MCH: 28 pg (ref 26.0–34.0)
MCHC: 32.6 g/dL (ref 30.0–36.0)
Platelets: 243 10*3/uL (ref 150–400)
RDW: 14.3 % (ref 11.5–15.5)

## 2012-07-09 LAB — POCT I-STAT TROPONIN I: Troponin i, poc: 0 ng/mL (ref 0.00–0.08)

## 2012-07-09 MED ORDER — CEPHALEXIN 500 MG PO CAPS: 500.0000 mg | ORAL_CAPSULE | Freq: Two times a day (BID) | ORAL | Status: AC

## 2012-07-09 MED ORDER — CEPHALEXIN 250 MG PO CAPS
500.0000 mg | ORAL_CAPSULE | Freq: Once | ORAL | Status: AC
  Administered 2012-07-09: 500 mg via ORAL
  Filled 2012-07-09: qty 2

## 2012-07-09 NOTE — ED Notes (Signed)
Patient reports everything got real bright and she thought she was going to pass out.  She denies pain.  She states she is feeling tired and sob for the past 3 days.   Patient has hx of heart failure.  She has not weighed herself.  She did call her md today but could not get transportation.  She states she feels like her chest is heavy.  Patient denies any lower extremity swelling.  She has also had periods of her heart racing at night
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
## 2013-02-12 ENCOUNTER — Other Ambulatory Visit: Payer: Self-pay | Admitting: Ophthalmology

## 2013-02-12 NOTE — H&P (Signed)
Patient Record  [name], [name]  Patient Number:  [id number] Date of Birth:  April 14, 1951 Age:  61 years old    Gender:  Female Date of Evaluation:  February 12, 2013 Referred by:  AVBUERE, EDWIN    Chief Complaint:   The patient is referred for Cataract evaluation. She reports that the lights look like diamonds, especially at night. She has difficulty watching TV in low light. She cannot see to drive well when the sun is low and driving related to glare. History of Present Illness:   61 yo female for follow  up conjunctivitis.  On tobradex.  Still has some tearing, some itching and burning, but less than before.  Crusting better.  Floaters seem like they are worse os.   Scheduled to see Dr. Savannha Welle for cataract evaluation next month.  Presents for evaluation. Past History:  Allergies:  Sulfa (Sulfonamide Antibiotics) Group (unspecified), Active hydrocodone-acetaminophen (unspecified), Active iodine (unspecified), Active Medications:   Other Medications:  losartan (losartan) by Thomas  Brewington, tablet 100 mg, Celexa (citalopram) by Thomas  Brewington, tablet 20 mg, TobraDex (tobramycin-dexamethasone) by Thomas  Brewington, drops,suspension 0.3-0.1% Apply 1 drop into both eyes four times a day, 5 ml, Start Date:09/26/2011, Lasix (furosemide) by Thomas  Brewington, tablet 80 mg 1 tablet by mouth once a day as directed tablet Birth History:  none Past Ocular History:  none Past Medical History:   arthritis    congestive heart failure  IBM  Elevated Cholesterol  high blood pressure Past Surgical History:   Bladder   gall bladder removed   hysterectomy   bowel obstruction femur surgery  knee replacement    breast surgery Family History:  no amblyopia, no blindness, + cataracts (mother), no crossed eyes, no diabetic retinopathy, no glaucoma, no macular degeneration, no retinal detachment, + cancer (grandmother, brother, aunt), + diabetes (brother), + heart disease (father, grandmother), + high blood  pressure (father, mother, brother, sister), no stroke Social History:   Smoking Status: never smoker  Alcohol:  none   Driving status:  driving Marital status:  seperated Review of Systems:   Eyes: + blurred vision, + flashes, + floaters, + itching, + tearing   Ear/Nose/Throat: + pain  Cardiovascular:  + high blood pressure  Respiratory: + bronchitis  Musculoskeletal: + arthritis  Psychiatric: + anxiety  All other systems are negative. Examination:  Visual Acuity:   Distance VA cc:  OD: 20/25    OS: 20/200  Distance VA [place]:  OD: 20/30    OS: 20/400 IOP:  OD:  15     OS:  16    @ 11:59AM (Goldmann applanation) Manifest Refraction:    Sphere    Cyl Axis       VA         Add       VA Prism Base R:  +0.50                20/25       +2.50                      L:  -4.50  -0.75   85    20/60       +2.50                       comments: No rx.  Will remo ve cataract first.  Confrontation visual field: OU:  Normal  Motility: OU:  Normal  Pupils: OU:    Shape, size, direct and consensual reaction normal  Adnexa:  Preauricular LN, lacrimal drainage, lacrimal glands, orbit normal  Eyelids: Eyelids:  normal Conjunctiva: OU:  discharge: mucoid  Cornea: OU:  superficial punctate keratitis,   mild  Anterior Chamber: OU:  depth normal, no cell, no flare  Iris: OU:  normal Dilation:  OU: AK-Dilate, Tropicacyl @ 03:12PM  Lens:  OD:  1+ nuclear sclerotic cataract,  3+ cortical cataract OS:  3+ dense central nuclear sclerotic cataract,  3+ cortical cataract  Vitreous:  OD:  posterior vitreous detachment with floaters; no retinal holes or tears OS:  normal  Optic Disc: OU:  cupping: 0.1   Macula: OU:  normal  Vessels: OU:  normal  Periphery: OU:  normal  Orientation to person, place and time:  Normal  Mood and affect:  Normal Impression:  366.19  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 We discussed the related symptoms , visual significance and when we intervene with surgery. We discussed the surgical techniques used, risks and benefits of surgery.  She has combined Cataracts OS>OD. She has a dense central nucleus OS.  I recommend Phacoemulsification, PC IOL OS.  She indicated understanding our discussion and felt that her questions had been answered to her satisfaction.  She indicates that she is comfortable with proceding with the recommended treatment/care plan.   Medications:   Given e-prescription for: TobraDex (tobramycin-dexamethasone) Drops, Suspension 0.3-0.1% Apply 1 drop into both eyes four times a day, 5 ml, Refills:1, Substitution Permitted:y [facility] [phone] (tobramycin-dexamethasone) drops,suspension 0.3-0.1% Apply 1 drop into both eyes four times a day, 5 ml, Refills:1, Substitution Permitted:y [facility] [phone] Patient Instructions: Please do not eat anything after mignight the day before surgery. Return to clinic:  2 months  Schedule:  Phacoemulsification, Posterior Chamber Intraocular Lens x OS x 03/03/2013.  (electronically signed) Tyronne Blann, MD   

## 2013-02-17 ENCOUNTER — Encounter (HOSPITAL_COMMUNITY): Payer: Self-pay | Admitting: Pharmacy Technician

## 2013-03-01 ENCOUNTER — Encounter (HOSPITAL_COMMUNITY): Payer: Self-pay

## 2013-03-01 ENCOUNTER — Encounter (HOSPITAL_COMMUNITY)
Admission: RE | Admit: 2013-03-01 | Discharge: 2013-03-01 | Disposition: A | Discharge status: Home / Self Care | Payer: Medicaid Other | Source: Ambulatory Visit | Attending: Ophthalmology | Admitting: Ophthalmology

## 2013-03-01 HISTORY — DX: Sleep disorder, unspecified: G47.9

## 2013-03-01 LAB — CBC
HCT: 39.8 % (ref 36.0–46.0)
Hemoglobin: 13.2 g/dL (ref 12.0–15.0)
MCHC: 33.2 g/dL (ref 30.0–36.0)
RDW: 13.9 % (ref 11.5–15.5)
WBC: 4.4 10*3/uL (ref 4.0–10.5)

## 2013-03-01 LAB — BASIC METABOLIC PANEL
Chloride: 102 mEq/L (ref 96–112)
Creatinine, Ser: 1.09 mg/dL (ref 0.50–1.10)
GFR calc Af Amer: 61 mL/min — ABNORMAL LOW (ref >90)
GFR calc non Af Amer: 53 mL/min — ABNORMAL LOW (ref >90)
Potassium: 4.1 mEq/L (ref 3.5–5.1)

## 2013-03-01 NOTE — Pre-Procedure Instructions (Signed)
[name]  03/01/2013   Your procedure is scheduled on:  03/03/2013  Report to [facility] at 11:15 AM. Entrance A  Call this number if you have problems the morning of surgery: [phone]   Remember:   Do not eat food or drink liquids after midnight. TUESDAY   Take these medicines the morning of surgery with A SIP OF WATER: Celexa, Prilosec   Do not wear jewelry, make-up or nail polish.  Do not wear lotions, powders, or perfumes. You may wear deodorant.  Do not shave 48 hours prior to surgery.   Do not bring valuables to the hospital.  [facility] is not responsible  for any belongings or valuables.  Contacts, dentures or bridgework may not be worn into surgery.  Leave suitcase in the car. After surgery it may be brought to your room.  For patients admitted to the hospital, checkout time is 11:00 AM the day of  discharge.   Patients discharged the day of surgery will not be allowed to drive  home.  Name and phone number of your driver: with Son  Special Instructions: Shower using CHG 2 nights before surgery and the night before surgery.  If you shower the day of surgery use CHG.  Use special wash - you have one bottle of CHG for all showers.  You should use approximately 1/3 of the bottle for each shower.   Please read over the following fact sheets that you were given: Pain Booklet, Coughing and Deep Breathing and Surgical Site Infection Prevention

## 2013-03-01 NOTE — Progress Notes (Signed)
Call to [facility], regarding anesth. History. Requested cardiac records from Dr. Allyson Sabal- [facility], pt. Not sure when she saw him last.

## 2013-03-01 NOTE — Progress Notes (Signed)
Pt. Last PCP appt. With Dr. Renee Ramus a month ago,- June 2014, he encouraged her to see Dr. Allyson Sabal again, to follow up for routine cardiac review, due to history of CHF

## 2013-03-03 ENCOUNTER — Ambulatory Visit (HOSPITAL_COMMUNITY): Payer: Medicaid Other | Admitting: Anesthesiology

## 2013-03-03 ENCOUNTER — Encounter (HOSPITAL_COMMUNITY): Payer: Self-pay | Admitting: Anesthesiology

## 2013-03-03 ENCOUNTER — Ambulatory Visit (HOSPITAL_COMMUNITY)
Admission: RE | Admit: 2013-03-03 | Discharge: 2013-03-03 | Disposition: A | Discharge status: Home / Self Care | Payer: Medicaid Other | Source: Ambulatory Visit | Attending: Ophthalmology | Admitting: Ophthalmology

## 2013-03-03 ENCOUNTER — Encounter (HOSPITAL_COMMUNITY)
Admission: RE | Disposition: A | Discharge status: Home / Self Care | Payer: Self-pay | Source: Ambulatory Visit | Attending: Ophthalmology

## 2013-03-03 ENCOUNTER — Encounter (HOSPITAL_COMMUNITY): Payer: Self-pay | Admitting: Vascular Surgery

## 2013-03-03 DIAGNOSIS — I509 Heart failure, unspecified: Secondary | ICD-10-CM | POA: Insufficient documentation

## 2013-03-03 DIAGNOSIS — I10 Essential (primary) hypertension: Secondary | ICD-10-CM | POA: Insufficient documentation

## 2013-03-03 DIAGNOSIS — J40 Bronchitis, not specified as acute or chronic: Secondary | ICD-10-CM | POA: Insufficient documentation

## 2013-03-03 DIAGNOSIS — IMO0002 Reserved for concepts with insufficient information to code with codable children: Secondary | ICD-10-CM | POA: Insufficient documentation

## 2013-03-03 DIAGNOSIS — H16149 Punctate keratitis, unspecified eye: Secondary | ICD-10-CM | POA: Insufficient documentation

## 2013-03-03 DIAGNOSIS — F411 Generalized anxiety disorder: Secondary | ICD-10-CM | POA: Insufficient documentation

## 2013-03-03 DIAGNOSIS — G7241 Inclusion body myositis [IBM]: Secondary | ICD-10-CM | POA: Insufficient documentation

## 2013-03-03 DIAGNOSIS — H1045 Other chronic allergic conjunctivitis: Secondary | ICD-10-CM | POA: Insufficient documentation

## 2013-03-03 DIAGNOSIS — H25049 Posterior subcapsular polar age-related cataract, unspecified eye: Secondary | ICD-10-CM | POA: Insufficient documentation

## 2013-03-03 DIAGNOSIS — Z885 Allergy status to narcotic agent status: Secondary | ICD-10-CM | POA: Insufficient documentation

## 2013-03-03 DIAGNOSIS — H2510 Age-related nuclear cataract, unspecified eye: Secondary | ICD-10-CM | POA: Insufficient documentation

## 2013-03-03 DIAGNOSIS — M129 Arthropathy, unspecified: Secondary | ICD-10-CM | POA: Insufficient documentation

## 2013-03-03 DIAGNOSIS — Z91041 Radiographic dye allergy status: Secondary | ICD-10-CM | POA: Insufficient documentation

## 2013-03-03 DIAGNOSIS — E7800 Pure hypercholesterolemia, unspecified: Secondary | ICD-10-CM | POA: Insufficient documentation

## 2013-03-03 DIAGNOSIS — Z79899 Other long term (current) drug therapy: Secondary | ICD-10-CM | POA: Insufficient documentation

## 2013-03-03 DIAGNOSIS — M3500 Sicca syndrome, unspecified: Secondary | ICD-10-CM | POA: Insufficient documentation

## 2013-03-03 DIAGNOSIS — Z882 Allergy status to sulfonamides status: Secondary | ICD-10-CM | POA: Insufficient documentation

## 2013-03-03 HISTORY — PX: PARS PLANA VITRECTOMY: SHX2166

## 2013-03-03 HISTORY — PX: CATARACT EXTRACTION W/PHACO: SHX586

## 2013-03-03 SURGERY — PHACOEMULSIFICATION, CATARACT, WITH IOL INSERTION
Anesthesia: Monitor Anesthesia Care | Site: Eye | Laterality: Left | Wound class: Clean

## 2013-03-03 MED ORDER — POLYMYXIN B SULFATE 500000 UNITS IJ SOLR
INTRAMUSCULAR | Status: AC
  Filled 2013-03-03: qty 1

## 2013-03-03 MED ORDER — LIDOCAINE HCL (CARDIAC) 20 MG/ML IV SOLN
INTRAVENOUS | Status: DC | PRN
  Administered 2013-03-03: 50 mg via INTRAVENOUS

## 2013-03-03 MED ORDER — ACETAZOLAMIDE SODIUM 500 MG IJ SOLR
INTRAMUSCULAR | Status: AC
  Filled 2013-03-03: qty 500

## 2013-03-03 MED ORDER — FENTANYL CITRATE 0.05 MG/ML IJ SOLN
INTRAMUSCULAR | Status: DC | PRN
  Administered 2013-03-03 (×2): 25 ug via INTRAVENOUS
  Administered 2013-03-03: 50 ug via INTRAVENOUS
  Administered 2013-03-03: 25 ug via INTRAVENOUS

## 2013-03-03 MED ORDER — CEFAZOLIN SUBCONJUNCTIVAL INJECTION 100 MG/0.5 ML
200.0000 mg | INJECTION | Freq: Once | SUBCONJUNCTIVAL | Status: AC
  Administered 2013-03-03: 100 mg via SUBCONJUNCTIVAL
  Filled 2013-03-03: qty 1

## 2013-03-03 MED ORDER — DEXAMETHASONE SODIUM PHOSPHATE 10 MG/ML IJ SOLN
INTRAMUSCULAR | Status: DC | PRN
  Administered 2013-03-03: 10 mg

## 2013-03-03 MED ORDER — TETRACAINE HCL 0.5 % OP SOLN
OPHTHALMIC | Status: DC | PRN
  Administered 2013-03-03: 2 [drp] via OPHTHALMIC

## 2013-03-03 MED ORDER — HYPROMELLOSE (GONIOSCOPIC) 2.5 % OP SOLN
OPHTHALMIC | Status: DC | PRN
  Administered 2013-03-03: 2 [drp] via OPHTHALMIC

## 2013-03-03 MED ORDER — NA CHONDROIT SULF-NA HYALURON 40-30 MG/ML IO SOLN
INTRAOCULAR | Status: DC | PRN
  Administered 2013-03-03: 0.5 mL via INTRAOCULAR

## 2013-03-03 MED ORDER — SODIUM HYALURONATE 10 MG/ML IO SOLN
INTRAOCULAR | Status: AC
  Filled 2013-03-03: qty 0.85

## 2013-03-03 MED ORDER — SODIUM CHLORIDE 0.9 % IV SOLN
INTRAVENOUS | Status: DC | PRN
  Administered 2013-03-03: 12:00:00 via INTRAVENOUS

## 2013-03-03 MED ORDER — PHENYLEPHRINE HCL 2.5 % OP SOLN
OPHTHALMIC | Status: AC
  Filled 2013-03-03: qty 2

## 2013-03-03 MED ORDER — BSS IO SOLN
INTRAOCULAR | Status: AC
  Filled 2013-03-03: qty 500

## 2013-03-03 MED ORDER — LIDOCAINE HCL 2 % IJ SOLN
INTRAMUSCULAR | Status: DC | PRN
  Administered 2013-03-03: 14:00:00 via RETROBULBAR

## 2013-03-03 MED ORDER — BACITRACIN-POLYMYXIN B 500-10000 UNIT/GM OP OINT
TOPICAL_OINTMENT | OPHTHALMIC | Status: DC | PRN
  Administered 2013-03-03: 1 via OPHTHALMIC

## 2013-03-03 MED ORDER — SODIUM CHLORIDE 0.9 % IV SOLN
INTRAVENOUS | Status: DC
  Administered 2013-03-03: 20 mL/h via INTRAVENOUS

## 2013-03-03 MED ORDER — GENTAMICIN SULFATE 40 MG/ML IJ SOLN
INTRAMUSCULAR | Status: AC
  Filled 2013-03-03: qty 2

## 2013-03-03 MED ORDER — NA CHONDROIT SULF-NA HYALURON 40-30 MG/ML IO SOLN
INTRAOCULAR | Status: AC
  Filled 2013-03-03: qty 0.5

## 2013-03-03 MED ORDER — EPINEPHRINE HCL 1 MG/ML IJ SOLN
INTRAMUSCULAR | Status: AC
  Filled 2013-03-03: qty 1

## 2013-03-03 MED ORDER — TETRACAINE HCL 0.5 % OP SOLN
2.0000 [drp] | OPHTHALMIC | Status: AC
  Administered 2013-03-03: 2 [drp] via OPHTHALMIC
  Filled 2013-03-03: qty 2

## 2013-03-03 MED ORDER — SODIUM CHLORIDE 0.9 % IJ SOLN
INTRAMUSCULAR | Status: DC | PRN
  Administered 2013-03-03: 14:00:00

## 2013-03-03 MED ORDER — PHENYLEPHRINE HCL 2.5 % OP SOLN
1.0000 [drp] | OPHTHALMIC | Status: AC | PRN
  Administered 2013-03-03 (×3): 1 [drp] via OPHTHALMIC

## 2013-03-03 MED ORDER — BSS IO SOLN
INTRAOCULAR | Status: DC | PRN
  Administered 2013-03-03: 14:00:00

## 2013-03-03 MED ORDER — PROPOFOL 10 MG/ML IV BOLUS
INTRAVENOUS | Status: DC | PRN
  Administered 2013-03-03: 10 mg via INTRAVENOUS
  Administered 2013-03-03: 30 mg via INTRAVENOUS
  Administered 2013-03-03: 10 mg via INTRAVENOUS

## 2013-03-03 MED ORDER — BUPIVACAINE HCL (PF) 0.75 % IJ SOLN
INTRAMUSCULAR | Status: AC
  Filled 2013-03-03: qty 10

## 2013-03-03 MED ORDER — BACITRACIN-POLYMYXIN B 500-10000 UNIT/GM OP OINT
TOPICAL_OINTMENT | OPHTHALMIC | Status: AC
  Filled 2013-03-03: qty 3.5

## 2013-03-03 MED ORDER — TETRACAINE HCL 0.5 % OP SOLN
OPHTHALMIC | Status: AC
  Filled 2013-03-03: qty 2

## 2013-03-03 MED ORDER — DEXAMETHASONE SODIUM PHOSPHATE 10 MG/ML IJ SOLN
INTRAMUSCULAR | Status: AC
  Filled 2013-03-03: qty 1

## 2013-03-03 MED ORDER — GATIFLOXACIN 0.5 % OP SOLN
1.0000 [drp] | OPHTHALMIC | Status: AC | PRN
  Administered 2013-03-03 (×3): 1 [drp] via OPHTHALMIC
  Filled 2013-03-03: qty 2.5

## 2013-03-03 MED ORDER — PREDNISOLONE ACETATE 1 % OP SUSP
1.0000 [drp] | OPHTHALMIC | Status: AC
  Administered 2013-03-03: 1 [drp] via OPHTHALMIC
  Filled 2013-03-03: qty 5

## 2013-03-03 MED ORDER — NEOMYCIN-POLYMYXIN-DEXAMETH 0.1 % OP SUSP
1.0000 [drp] | Freq: Once | OPHTHALMIC | Status: DC
  Filled 2013-03-03 (×2): qty 1

## 2013-03-03 MED ORDER — LIDOCAINE HCL 2 % IJ SOLN
INTRAMUSCULAR | Status: AC
  Filled 2013-03-03: qty 20

## 2013-03-03 MED ORDER — MIDAZOLAM HCL 5 MG/5ML IJ SOLN
INTRAMUSCULAR | Status: DC | PRN
  Administered 2013-03-03: 2 mg via INTRAVENOUS

## 2013-03-03 MED ORDER — PROVISC 10 MG/ML IO SOLN
INTRAOCULAR | Status: DC | PRN
  Administered 2013-03-03: 8.5 mg via INTRAOCULAR

## 2013-03-03 SURGICAL SUPPLY — 47 items
APL SRG 3 HI ABS STRL LF PLS (MISCELLANEOUS) ×1
APPLICATOR COTTON TIP 6IN STRL (MISCELLANEOUS) ×2 IMPLANT
APPLICATOR DR MATTHEWS STRL (MISCELLANEOUS) ×2 IMPLANT
BAG FLD CLT MN 6.25X3.5 (WOUND CARE) ×1
BAG MINI COLL DRAIN (WOUND CARE) ×2 IMPLANT
BLADE KERATOME 2.75 (BLADE) ×2 IMPLANT
CLOTH BEACON ORANGE TIMEOUT ST (SAFETY) ×2 IMPLANT
DRAPE OPHTHALMIC 77X100 STRL (CUSTOM PROCEDURE TRAY) ×2 IMPLANT
DRAPE POUCH INSTRU U-SHP 10X18 (DRAPES) ×2 IMPLANT
DRSG TEGADERM 4X4.75 (GAUZE/BANDAGES/DRESSINGS) ×2 IMPLANT
GLOVE SS BIOGEL STRL SZ 6.5 (GLOVE) ×1 IMPLANT
GLOVE SUPERSENSE BIOGEL SZ 6.5 (GLOVE) ×1
GOWN SRG XL XLNG 56XLVL 4 (GOWN DISPOSABLE) ×1 IMPLANT
GOWN STRL NON-REIN LRG LVL3 (GOWN DISPOSABLE) ×2 IMPLANT
GOWN STRL NON-REIN XL XLG LVL4 (GOWN DISPOSABLE) ×2
ILLUMINATOR WD SAPHIRE 23G (OPHTHALMIC) IMPLANT
KIT BASIN OR (CUSTOM PROCEDURE TRAY) ×2 IMPLANT
KIT ROOM TURNOVER OR (KITS) ×1 IMPLANT
KNIFE GRIESHABER SHARP 2.5MM (MISCELLANEOUS) ×2 IMPLANT
LENS IOL ACRYSOF MP POST 19.0 (Intraocular Lens) ×1 IMPLANT
MASK EYE SHIELD (GAUZE/BANDAGES/DRESSINGS) ×1 IMPLANT
NDL 18GX1X1/2 (RX/OR ONLY) (NEEDLE) IMPLANT
NDL 25GX 5/8IN NON SAFETY (NEEDLE) ×1 IMPLANT
NDL FILTER BLUNT 18X1 1/2 (NEEDLE) IMPLANT
NDL HYPO 30X.5 LL (NEEDLE) ×2 IMPLANT
NEEDLE 18GX1X1/2 (RX/OR ONLY) (NEEDLE) ×2 IMPLANT
NEEDLE 22X1 1/2 (OR ONLY) (NEEDLE) ×2 IMPLANT
NEEDLE 25GX 5/8IN NON SAFETY (NEEDLE) ×2 IMPLANT
NEEDLE FILTER BLUNT 18X 1/2SAF (NEEDLE) ×1
NEEDLE FILTER BLUNT 18X1 1/2 (NEEDLE) ×1 IMPLANT
NEEDLE HYPO 30X.5 LL (NEEDLE) ×4 IMPLANT
NS IRRIG 1000ML POUR BTL (IV SOLUTION) ×2 IMPLANT
PACK CATARACT CUSTOM (CUSTOM PROCEDURE TRAY) ×2 IMPLANT
PACK CATARACT MCHSCP (PACKS) ×2 IMPLANT
PACK FRAGMATOME (OPHTHALMIC) ×1 IMPLANT
PAD ARMBOARD 7.5X6 YLW CONV (MISCELLANEOUS) ×4 IMPLANT
PAD EYE OVAL STERILE LF (GAUZE/BANDAGES/DRESSINGS) ×1 IMPLANT
PAK VITRECTOMY PIK  23GA (OPHTHALMIC RELATED) ×1 IMPLANT
SHUTTLE MONARCH TYPE A (NEEDLE) ×2 IMPLANT
SPEAR EYE SURG WECK-CEL (MISCELLANEOUS) IMPLANT
SYR 20CC LL (SYRINGE) ×1 IMPLANT
SYR TB 1ML LUER SLIP (SYRINGE) ×1 IMPLANT
TIP ABS 45DEG FLARED 0.9MM (TIP) ×2 IMPLANT
TOWEL OR 17X24 6PK STRL BLUE (TOWEL DISPOSABLE) ×4 IMPLANT
WATER STERILE IRR 1000ML POUR (IV SOLUTION) ×2 IMPLANT
WIPE INSTRUMENT ADHESIVE BACK (MISCELLANEOUS) ×2 IMPLANT
WIPE INSTRUMENT VISIWIPE 73X73 (MISCELLANEOUS) ×2 IMPLANT

## 2013-03-03 NOTE — Interval H&P Note (Signed)
History and Physical Interval Note:  03/03/2013 12:49 PM  [name]  has presented today for surgery, with the diagnosis of Combined Cataract Left Eye  The various methods of treatment have been discussed with the patient and family. After consideration of risks, benefits and other options for treatment, the patient has consented to  Procedure(s): CATARACT EXTRACTION PHACO AND INTRAOCULAR LENS PLACEMENT (IOC) (Left) as a surgical intervention .  The patient's history has been reviewed, patient examined, no change in status, stable for surgery.  I have reviewed the patient's chart and labs.  Questions were answered to the patient's satisfaction.     Daneli Butkiewicz, Waynette Buttery

## 2013-03-03 NOTE — Transfer of Care (Signed)
Immediate Anesthesia Transfer of Care Note  Patient: [name]  Procedure(s) Performed: Procedure(s): CATARACT EXTRACTION PHACO AND INTRAOCULAR LENS PLACEMENT (IOC) (Left) PARS PLANA VITRECTOMY WITH 23 GAUGE (Left)  Patient Location: PACU  Anesthesia Type:MAC  Level of Consciousness: awake, alert  and oriented  Airway & Oxygen Therapy: Patient Spontanous Breathing  Post-op Assessment: Report given to PACU RN and Post -op Vital signs reviewed and stable  Post vital signs: Reviewed and stable  Complications: No apparent anesthesia complications

## 2013-03-03 NOTE — Preoperative (Signed)
Beta Blockers   Reason not to administer Beta Blockers:Not Applicable 

## 2013-03-03 NOTE — Op Note (Signed)
[name] 03/03/2013 Cataract: Combined, Nuclear  Procedure: Phacoemulsification, Posterior Chamber Intra-ocular Lens Operative Eye:  left eye  Surgeon: Shade Flood Estimated Blood Loss: minimal Specimens for Pathology:  None Complications: Radial capsule tear with Cataract Fragment in the Vitreous  The patient was prepared and draped in the usual manner for ocular surgery on the left eye. A Cook lid speculum was placed. A peripheral clear corneal incision was made at the surgical limbus centered at the 11:00 meridian. A separate clear corneal stab incision was made with a 15 degree blade at the 2:00 meridian to permit bi-manual technique. Viscoat and  Provisc as an underlying layer next to the capsule was instilled into the anterior chamber through that incision.  A keratome was used to create a self sealing incision entering the anterior chamber at the 11:00 meridian. A capsulorhexis was performed using a bent 25g needle. The lens was hydrodissected and the nucleus was hydrodilineated using a Nichammin cannula. The Chang chopper was inserted and used to rotate the lens to insure adequate lens mobility. The phacoemulsification handpiece was inserted and a combined phaco-chop technique was employed, fracturing the lens into separate sections with subsequent removal with the phaco handpiece. During removal of the last nuclear fragment, a radial tear developed at 9:00. The remaining nuclear fragment dropped posteriorly. I converted to 23gauge vitrectomy. Core vitrectomy was performed with removal of cortex remaining in the capsule. The lens frgamnet was broken into smaller pieces with the light pipe and vitreous cutter and subsequently removed with the vitreous cutter. Care was taken to remove vitreous up to the vitreous base for 360 degrees. There were no retinal breaks with wide field lens evaluation at conclusion of the vitrectomy.  Provisc was instilled and used to deepen the anterior  chamber and posterior capsule bag. The Monarch injector was used to place a folded Acrysof [id number] PC IOL, + 19.00  diopters, into the sulcus in front of the anterior capsule.  A Sinskey lens hook was used to dial in the trailing haptic.  The 23g vitreous cutter  was used to remove the viscoelastic from the anterior chamber. BSS was used to bring IOP to the desired range and the wound was checked to insure it was watertight. Subconjunctival injections of Ancef 100/0.26ml and Dexamethasone 0.5 ml of a 10mg /88ml solution were placed without complication. The lid speculum and drapes were removed and the patient's eye was patched with Polymixin/Bacitracin ophthalmic ointment. An eye shield was placed and the patient was transferred alert and conversant from the operating room to the post-operative recovery area.   Shade Flood, MD

## 2013-03-03 NOTE — Anesthesia Preprocedure Evaluation (Addendum)
Anesthesia Evaluation  Patient identified by MRN, date of birth, ID band Patient awake    Reviewed: Allergy & Precautions, H&P , NPO status , Patient's Chart, lab work & pertinent test results  History of Anesthesia Complications (+) PROLONGED EMERGENCE  Airway Mallampati: II TM Distance: >3 FB Neck ROM: Full    Dental  (+) Teeth Intact and Dental Advisory Given   Pulmonary neg pulmonary ROS,    Pulmonary exam normal       Cardiovascular hypertension, Pt. on medications +CHF     Neuro/Psych Anxiety negative neurological ROS     GI/Hepatic Neg liver ROS, GERD-  Medicated,  Endo/Other  negative endocrine ROS  Renal/GU negative Renal ROS     Musculoskeletal   Abdominal   Peds  Hematology negative hematology ROS (+)   Anesthesia Other Findings   Reproductive/Obstetrics                          Anesthesia Physical Anesthesia Plan  ASA: III  Anesthesia Plan: MAC   Post-op Pain Management:    Induction:   Airway Management Planned: Nasal Cannula  Additional Equipment:   Intra-op Plan:   Post-operative Plan:   Informed Consent: I have reviewed the patients History and Physical, chart, labs and discussed the procedure including the risks, benefits and alternatives for the proposed anesthesia with the patient or authorized representative who has indicated his/her understanding and acceptance.   Dental advisory given  Plan Discussed with: CRNA, Anesthesiologist and Surgeon  Anesthesia Plan Comments:         Anesthesia Quick Evaluation

## 2013-03-03 NOTE — H&P (View-Only) (Signed)
Patient Record  [name], [name]  Patient Number:  [id number] Date of Birth:  03-12-1951 Age:  62 years old    Gender:  Female Date of Evaluation:  February 12, 2013 Referred by:  Fleet Contras    Chief Complaint:   The patient is referred for Cataract evaluation. She reports that the lights look like diamonds, especially at night. She has difficulty watching TV in low light. She cannot see to drive well when the sun is low and driving related to glare. History of Present Illness:   62 yo female for follow  up conjunctivitis.  On tobradex.  Still has some tearing, some itching and burning, but less than before.  Crusting better.  Floaters seem like they are worse os.   Scheduled to see Dr. Clarisa Kindred for cataract evaluation next month.  Presents for evaluation. Past History:  Allergies:  Sulfa (Sulfonamide Antibiotics) Group (unspecified), Active hydrocodone-acetaminophen (unspecified), Active iodine (unspecified), Active Medications:   Other Medications:  losartan (losartan) by Nadyne Coombes, tablet 100 mg, Celexa (citalopram) by Nadyne Coombes, tablet 20 mg, TobraDex (tobramycin-dexamethasone) by Nadyne Coombes, drops,suspension 0.3-0.1% Apply 1 drop into both eyes four times a day, 5 ml, Start Date:09/26/2011, Lasix (furosemide) by Nadyne Coombes, tablet 80 mg 1 tablet by mouth once a day as directed tablet Birth History:  none Past Ocular History:  none Past Medical History:   arthritis    congestive heart failure  IBM  Elevated Cholesterol  high blood pressure Past Surgical History:   Bladder   gall bladder removed   hysterectomy   bowel obstruction femur surgery  knee replacement    breast surgery Family History:  no amblyopia, no blindness, + cataracts (mother), no crossed eyes, no diabetic retinopathy, no glaucoma, no macular degeneration, no retinal detachment, + cancer (grandmother, brother, aunt), + diabetes (brother), + heart disease (father, grandmother), + high blood  pressure (father, mother, brother, sister), no stroke Social History:   Smoking Status: never smoker  Alcohol:  none   Driving status:  driving Marital status:  seperated Review of Systems:   Eyes: + blurred vision, + flashes, + floaters, + itching, + tearing   Ear/Nose/Throat: + pain  Cardiovascular:  + high blood pressure  Respiratory: + bronchitis  Musculoskeletal: + arthritis  Psychiatric: + anxiety  All other systems are negative. Examination:  Visual Acuity:   Distance VA cc:  OD: 20/25    OS: 20/200  Distance VA [place]:  OD: 20/30    OS: 20/400 IOP:  OD:  15     OS:  16    @ 11:59AM (Goldmann applanation) Manifest Refraction:    Sphere    Cyl Axis       VA         Add       VA Prism Base R:  +0.50                20/25       +2.50                      L:  -4.50  -0.75   85    20/60       +2.50                       comments: No rx.  Will remo ve cataract first.  Confrontation visual field: OU:  Normal  Motility: OU:  Normal  Pupils: OU:  Shape, size, direct and consensual reaction normal  Adnexa:  Preauricular LN, lacrimal drainage, lacrimal glands, orbit normal  Eyelids: Eyelids:  normal Conjunctiva: OU:  discharge: mucoid  Cornea: OU:  superficial punctate keratitis,   mild  Anterior Chamber: OU:  depth normal, no cell, no flare  Iris: OU:  normal Dilation:  OU: AK-Dilate, Tropicacyl @ 03:12PM  Lens:  OD:  1+ nuclear sclerotic cataract,  3+ cortical cataract OS:  3+ dense central nuclear sclerotic cataract,  3+ cortical cataract  Vitreous:  OD:  posterior vitreous detachment with floaters; no retinal holes or tears OS:  normal  Optic Disc: OU:  cupping: 0.1   Macula: OU:  normal  Vessels: OU:  normal  Periphery: OU:  normal  Orientation to person, place and time:  Normal  Mood and affect:  Normal Impression:  366.19  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 We discussed the related symptoms , visual significance and when we intervene with surgery. We discussed the surgical techniques used, risks and benefits of surgery.  She has combined Cataracts OS>OD. She has a dense central nucleus OS.  I recommend Phacoemulsification, PC IOL OS.  She indicated understanding our discussion and felt that her questions had been answered to her satisfaction.  She indicates that she is comfortable with proceding with the recommended treatment/care plan.   Medications:   Given e-prescription for: TobraDex (tobramycin-dexamethasone) Drops, Suspension 0.3-0.1% Apply 1 drop into both eyes four times a day, 5 ml, Refills:1, Substitution Permitted:y [facility] [phone] (tobramycin-dexamethasone) drops,suspension 0.3-0.1% Apply 1 drop into both eyes four times a day, 5 ml, Refills:1, Substitution Permitted:y [facility] [phone] Patient Instructions: Please do not eat anything after mignight the day before surgery. Return to clinic:  2 months  Schedule:  Phacoemulsification, Posterior Chamber Intraocular Lens x OS x 03/03/2013.  (electronically signed) Shade Flood, MD

## 2013-03-04 NOTE — Anesthesia Postprocedure Evaluation (Signed)
Anesthesia Post Note  Patient: [name]  Procedure(s) Performed: Procedure(s) (LRB): CATARACT EXTRACTION PHACO AND INTRAOCULAR LENS PLACEMENT (IOC) (Left) PARS PLANA VITRECTOMY WITH 23 GAUGE (Left)  Anesthesia type: MAC  Patient location: PACU  Post pain: Pain level controlled  Post assessment: Patient's Cardiovascular Status Stable  Last Vitals:  Filed Vitals:   03/03/13 1450  BP: 135/77  Pulse: 70  Temp: 36.7 C  Resp: 14    Post vital signs: Reviewed and stable  Level of consciousness: sedated  Complications: No apparent anesthesia complications

## 2013-03-05 ENCOUNTER — Encounter (HOSPITAL_COMMUNITY): Payer: Self-pay | Admitting: Ophthalmology

## 2013-03-17 ENCOUNTER — Encounter (HOSPITAL_COMMUNITY): Payer: Self-pay | Admitting: *Deleted

## 2013-03-17 ENCOUNTER — Emergency Department (HOSPITAL_COMMUNITY): Payer: Medicaid Other

## 2013-03-17 ENCOUNTER — Emergency Department (HOSPITAL_COMMUNITY)
Admission: EM | Admit: 2013-03-17 | Discharge: 2013-03-17 | Disposition: A | Discharge status: Home / Self Care | Payer: Medicaid Other | Attending: Emergency Medicine | Admitting: Emergency Medicine

## 2013-03-17 DIAGNOSIS — M1712 Unilateral primary osteoarthritis, left knee: Secondary | ICD-10-CM

## 2013-03-17 DIAGNOSIS — G479 Sleep disorder, unspecified: Secondary | ICD-10-CM | POA: Insufficient documentation

## 2013-03-17 DIAGNOSIS — Z8619 Personal history of other infectious and parasitic diseases: Secondary | ICD-10-CM | POA: Insufficient documentation

## 2013-03-17 DIAGNOSIS — Z96659 Presence of unspecified artificial knee joint: Secondary | ICD-10-CM | POA: Insufficient documentation

## 2013-03-17 DIAGNOSIS — E7800 Pure hypercholesterolemia, unspecified: Secondary | ICD-10-CM | POA: Insufficient documentation

## 2013-03-17 DIAGNOSIS — I509 Heart failure, unspecified: Secondary | ICD-10-CM | POA: Insufficient documentation

## 2013-03-17 DIAGNOSIS — M129 Arthropathy, unspecified: Secondary | ICD-10-CM | POA: Insufficient documentation

## 2013-03-17 DIAGNOSIS — Z882 Allergy status to sulfonamides status: Secondary | ICD-10-CM | POA: Insufficient documentation

## 2013-03-17 DIAGNOSIS — Z79899 Other long term (current) drug therapy: Secondary | ICD-10-CM | POA: Insufficient documentation

## 2013-03-17 DIAGNOSIS — K219 Gastro-esophageal reflux disease without esophagitis: Secondary | ICD-10-CM | POA: Insufficient documentation

## 2013-03-17 DIAGNOSIS — K589 Irritable bowel syndrome without diarrhea: Secondary | ICD-10-CM | POA: Insufficient documentation

## 2013-03-17 DIAGNOSIS — F411 Generalized anxiety disorder: Secondary | ICD-10-CM | POA: Insufficient documentation

## 2013-03-17 DIAGNOSIS — Z888 Allergy status to other drugs, medicaments and biological substances status: Secondary | ICD-10-CM | POA: Insufficient documentation

## 2013-03-17 DIAGNOSIS — M25469 Effusion, unspecified knee: Secondary | ICD-10-CM | POA: Insufficient documentation

## 2013-03-17 DIAGNOSIS — M25569 Pain in unspecified knee: Secondary | ICD-10-CM | POA: Insufficient documentation

## 2013-03-17 DIAGNOSIS — Z885 Allergy status to narcotic agent status: Secondary | ICD-10-CM | POA: Insufficient documentation

## 2013-03-17 DIAGNOSIS — M25562 Pain in left knee: Secondary | ICD-10-CM

## 2013-03-17 MED ORDER — OXYCODONE-ACETAMINOPHEN 5-325 MG PO TABS
1.0000 | ORAL_TABLET | Freq: Once | ORAL | Status: AC
  Administered 2013-03-17: 1 via ORAL
  Filled 2013-03-17: qty 1

## 2013-03-17 MED ORDER — OXYCODONE-ACETAMINOPHEN 5-325 MG PO TABS: 1.0000 | ORAL_TABLET | ORAL | Status: DC | PRN

## 2013-03-17 NOTE — ED Notes (Signed)
Patient transported to X-ray 

## 2013-03-17 NOTE — Discharge Instructions (Signed)
Arthralgia Your caregiver has diagnosed you as suffering from an arthralgia. Arthralgia means there is pain in a joint. This can come from many reasons including:  Bruising the joint which causes soreness (inflammation) in the joint.  Wear and tear on the joints which occur as we grow older (osteoarthritis).  Overusing the joint.  Various forms of arthritis.  Infections of the joint. Regardless of the cause of pain in your joint, most of these different pains respond to anti-inflammatory drugs and rest. The exception to this is when a joint is infected, and these cases are treated with antibiotics, if it is a bacterial infection. HOME CARE INSTRUCTIONS   Rest the injured area for as long as directed by your caregiver. Then slowly start using the joint as directed by your caregiver and as the pain allows. Crutches as directed may be useful if the ankles, knees or hips are involved. If the knee was splinted or casted, continue use and care as directed. If an stretchy or elastic wrapping bandage has been applied today, it should be removed and re-applied every 3 to 4 hours. It should not be applied tightly, but firmly enough to keep swelling down. Watch toes and feet for swelling, bluish discoloration, coldness, numbness or excessive pain. If any of these problems (symptoms) occur, remove the ace bandage and re-apply more loosely. If these symptoms persist, contact your caregiver or return to this location.  For the first 24 hours, keep the injured extremity elevated on pillows while lying down.  Apply ice for 15-20 minutes to the sore joint every couple hours while awake for the first half day. Then 3-4 times per day for the first 48 hours. Put the ice in a plastic bag and place a towel between the bag of ice and your skin.  Wear any splinting, casting, elastic bandage applications, or slings as instructed.  Only take over-the-counter or prescription medicines for pain, discomfort, or fever as  directed by your caregiver. Do not use aspirin immediately after the injury unless instructed by your physician. Aspirin can cause increased bleeding and bruising of the tissues.  If you were given crutches, continue to use them as instructed and do not resume weight bearing on the sore joint until instructed. Persistent pain and inability to use the sore joint as directed for more than 2 to 3 days are warning signs indicating that you should see a caregiver for a follow-up visit as soon as possible. Initially, a hairline fracture (break in bone) may not be evident on X-rays. Persistent pain and swelling indicate that further evaluation, non-weight bearing or use of the joint (use of crutches or slings as instructed), or further X-rays are indicated. X-rays may sometimes not show a small fracture until a week or 10 days later. Make a follow-up appointment with your own caregiver or one to whom we have referred you. A radiologist (specialist in reading X-rays) may read your X-rays. Make sure you know how you are to obtain your X-ray results. Do not assume everything is normal if you do not hear from [name]. SEEK MEDICAL CARE IF: Bruising, swelling, or pain increases. SEEK IMMEDIATE MEDICAL CARE IF:   Your fingers or toes are numb or blue.  The pain is not responding to medications and continues to stay the same or get worse.  The pain in your joint becomes severe.  You develop a fever over 102 F (38.9 C).  It becomes impossible to move or use the joint. MAKE SURE YOU:  Understand these instructions.  Will watch your condition.  Will get help right away if you are not doing well or get worse. Document Released: 08/05/2005 Document Revised: 10/28/2011 Document Reviewed: 03/23/2008 [facility] Patient Information 2014 [place].  Arthritis, Nonspecific Arthritis is inflammation of a joint. This usually means pain, redness, warmth or swelling are present. One or more joints may be involved.  There are a number of types of arthritis. Your caregiver may not be able to tell what type of arthritis you have right away. CAUSES  The most common cause of arthritis is the wear and tear on the joint (osteoarthritis). This causes damage to the cartilage, which can break down over time. The knees, hips, back and neck are most often affected by this type of arthritis. Other types of arthritis and common causes of joint pain include:  Sprains and other injuries near the joint. Sometimes minor sprains and injuries cause pain and swelling that develop hours later.  Rheumatoid arthritis. This affects hands, feet and knees. It usually affects both sides of your body at the same time. It is often associated with chronic ailments, fever, weight loss and general weakness.  Crystal arthritis. Gout and pseudo gout can cause occasional acute severe pain, redness and swelling in the foot, ankle, or knee.  Infectious arthritis. Bacteria can get into a joint through a break in overlying skin. This can cause infection of the joint. Bacteria and viruses can also spread through the blood and affect your joints.  Drug, infectious and allergy reactions. Sometimes joints can become mildly painful and slightly swollen with these types of illnesses. SYMPTOMS   Pain is the main symptom.  Your joint or joints can also be red, swollen and warm or hot to the touch.  You may have a fever with certain types of arthritis, or even feel overall ill.  The joint with arthritis will hurt with movement. Stiffness is present with some types of arthritis. DIAGNOSIS  Your caregiver will suspect arthritis based on your description of your symptoms and on your exam. Testing may be needed to find the type of arthritis:  Blood and sometimes urine tests.  X-ray tests and sometimes CT or MRI scans.  Removal of fluid from the joint (arthrocentesis) is done to check for bacteria, crystals or other causes. Your caregiver (or a  specialist) will numb the area over the joint with a local anesthetic, and use a needle to remove joint fluid for examination. This procedure is only minimally uncomfortable.  Even with these tests, your caregiver may not be able to tell what kind of arthritis you have. Consultation with a specialist (rheumatologist) may be helpful. TREATMENT  Your caregiver will discuss with you treatment specific to your type of arthritis. If the specific type cannot be determined, then the following general recommendations may apply. Treatment of severe joint pain includes:  Rest.  Elevation.  Anti-inflammatory medication (for example, ibuprofen) may be prescribed. Avoiding activities that cause increased pain.  Only take over-the-counter or prescription medicines for pain and discomfort as recommended by your caregiver.  Cold packs over an inflamed joint may be used for 10 to 15 minutes every hour. Hot packs sometimes feel better, but do not use overnight. Do not use hot packs if you are diabetic without your caregiver's permission.  A cortisone shot into arthritic joints may help reduce pain and swelling.  Any acute arthritis that gets worse over the next 1 to 2 days needs to be looked at to be sure  there is no joint infection. Long-term arthritis treatment involves modifying activities and lifestyle to reduce joint stress jarring. This can include weight loss. Also, exercise is needed to nourish the joint cartilage and remove waste. This helps keep the muscles around the joint strong. HOME CARE INSTRUCTIONS   Do not take aspirin to relieve pain if gout is suspected. This elevates uric acid levels.  Only take over-the-counter or prescription medicines for pain, discomfort or fever as directed by your caregiver.  Rest the joint as much as possible.  If your joint is swollen, keep it elevated.  Use crutches if the painful joint is in your leg.  Drinking plenty of fluids may help for certain types  of arthritis.  Follow your caregiver's dietary instructions.  Try low-impact exercise such as:  Swimming.  Water aerobics.  Biking.  Walking.  Morning stiffness is often relieved by a warm shower.  Put your joints through regular range-of-motion. SEEK MEDICAL CARE IF:   You do not feel better in 24 hours or are getting worse.  You have side effects to medications, or are not getting better with treatment. SEEK IMMEDIATE MEDICAL CARE IF:   You have a fever.  You develop severe joint pain, swelling or redness.  Many joints are involved and become painful and swollen.  There is severe back pain and/or leg weakness.  You have loss of bowel or bladder control. Document Released: 09/12/2004 Document Revised: 10/28/2011 Document Reviewed: 09/28/2008 [facility] Patient Information 2014 [place], [place].
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
## 2013-03-17 NOTE — ED Notes (Signed)
The pt has had lt knee pain for awhile and since Sunday the pain  Has been worse.  She sat down and the pain was severe when she stood upright

## 2013-03-18 NOTE — ED Provider Notes (Signed)
Medical screening examination/treatment/procedure(s) were performed by non-physician practitioner and as supervising physician I was immediately available for consultation/collaboration.   Bana Borgmeyer, MD 03/18/13 1759 

## 2013-04-07 ENCOUNTER — Other Ambulatory Visit: Payer: Self-pay | Admitting: Orthopaedic Surgery

## 2013-04-14 ENCOUNTER — Other Ambulatory Visit: Payer: Self-pay | Admitting: Orthopaedic Surgery

## 2013-04-20 ENCOUNTER — Encounter (HOSPITAL_COMMUNITY): Payer: Self-pay | Admitting: Pharmacy Technician

## 2013-04-21 NOTE — Pre-Procedure Instructions (Signed)
[name]  04/21/2013   Your procedure is scheduled on:  04/27/13  Report to [facility] at 830 AM.  Call this number if you have problems the morning of surgery: [phone]   Remember:   Do not eat food or drink liquids after midnight.   Take these medicines the morning of surgery with A SIP OF WATER: all inhalers,celexa,hydrocodone,prilosec,or oxycodone   Do not wear jewelry, make-up or nail polish.  Do not wear lotions, powders, or perfumes. You may wear deodorant.  Do not shave 48 hours prior to surgery. Men may shave face and neck.  Do not bring valuables to the hospital.  [facility] is not responsible                   for any belongings or valuables.  Contacts, dentures or bridgework may not be worn into surgery.  Leave suitcase in the car. After surgery it may be brought to your room.  For patients admitted to the hospital, checkout time is 11:00 AM the day of  discharge.   Patients discharged the day of surgery will not be allowed to drive  home.  Name and phone number of your driver: family  Special Instructions: Shower using CHG 2 nights before surgery and the night before surgery.  If you shower the day of surgery use CHG.  Use special wash - you have one bottle of CHG for all showers.  You should use approximately 1/3 of the bottle for each shower.   Please read over the following fact sheets that you were given: Pain Booklet, Coughing and Deep Breathing, Blood Transfusion Information, MRSA Information and Surgical Site Infection Prevention

## 2013-04-22 ENCOUNTER — Encounter (HOSPITAL_COMMUNITY): Payer: Self-pay

## 2013-04-22 ENCOUNTER — Encounter (HOSPITAL_COMMUNITY)
Admission: RE | Admit: 2013-04-22 | Discharge: 2013-04-22 | Disposition: A | Discharge status: Home / Self Care | Payer: Medicaid Other | Source: Ambulatory Visit | Attending: Orthopaedic Surgery | Admitting: Orthopaedic Surgery

## 2013-04-22 DIAGNOSIS — Z01812 Encounter for preprocedural laboratory examination: Secondary | ICD-10-CM | POA: Insufficient documentation

## 2013-04-22 DIAGNOSIS — Z01818 Encounter for other preprocedural examination: Secondary | ICD-10-CM | POA: Insufficient documentation

## 2013-04-22 LAB — BASIC METABOLIC PANEL
CO2: 30 mEq/L (ref 19–32)
Calcium: 9.4 mg/dL (ref 8.4–10.5)
Chloride: 102 mEq/L (ref 96–112)
Glucose, Bld: 91 mg/dL (ref 70–99)
Sodium: 140 mEq/L (ref 135–145)

## 2013-04-22 LAB — CBC WITH DIFFERENTIAL/PLATELET
Eosinophils Absolute: 0.2 10*3/uL (ref 0.0–0.7)
Eosinophils Relative: 5 % (ref 0–5)
HCT: 42.3 % (ref 36.0–46.0)
Hemoglobin: 14.3 g/dL (ref 12.0–15.0)
Lymphocytes Relative: 41 % (ref 12–46)
Lymphs Abs: 1.9 10*3/uL (ref 0.7–4.0)
MCH: 29.3 pg (ref 26.0–34.0)
MCV: 86.7 fL (ref 78.0–100.0)
Monocytes Absolute: 0.4 10*3/uL (ref 0.1–1.0)
Monocytes Relative: 9 % (ref 3–12)
Platelets: 213 10*3/uL (ref 150–400)
RBC: 4.88 MIL/uL (ref 3.87–5.11)
WBC: 4.7 10*3/uL (ref 4.0–10.5)

## 2013-04-22 LAB — URINALYSIS, ROUTINE W REFLEX MICROSCOPIC
Bilirubin Urine: NEGATIVE
Nitrite: NEGATIVE
Specific Gravity, Urine: 1.016 (ref 1.005–1.030)
Urobilinogen, UA: 0.2 mg/dL (ref 0.0–1.0)
pH: 5.5 (ref 5.0–8.0)

## 2013-04-22 LAB — TYPE AND SCREEN: Antibody Screen: NEGATIVE

## 2013-04-22 LAB — SURGICAL PCR SCREEN: Staphylococcus aureus: NEGATIVE

## 2013-04-22 LAB — URINE MICROSCOPIC-ADD ON

## 2013-04-22 LAB — APTT: aPTT: 28 seconds (ref 24–37)

## 2013-04-22 NOTE — Progress Notes (Signed)
Will Call Dr Renelda Mom office for cath,ov

## 2013-04-23 LAB — URINE CULTURE: Colony Count: >=100000

## 2013-04-23 NOTE — Progress Notes (Signed)
Call to Pt. To inquire about prev. Cardiac care; she reports that she never had a cardiac cath.  She recalls having a stress test  Several yrs. Ago with Dr. Hazle Coca office that was wnl. Pt. Denies chest pain, SOB or any difficulty breathing in recent 3 yrs.
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
## 2013-04-26 MED ORDER — CHLORHEXIDINE GLUCONATE 4 % EX LIQD: 60.0000 mL | Freq: Once | CUTANEOUS | Status: DC

## 2013-04-26 MED ORDER — DEXTROSE 5 % IV SOLN
3.0000 g | INTRAVENOUS | Status: AC
  Administered 2013-04-27: 3 g via INTRAVENOUS
  Filled 2013-04-26: qty 3000

## 2013-04-27 ENCOUNTER — Inpatient Hospital Stay (HOSPITAL_COMMUNITY): Payer: Medicaid Other | Admitting: Certified Registered Nurse Anesthetist

## 2013-04-27 ENCOUNTER — Inpatient Hospital Stay (HOSPITAL_COMMUNITY)
Admission: RE | Admit: 2013-04-27 | Discharge: 2013-04-30 | DRG: 470 | Disposition: A | Discharge status: Home Health | Payer: Medicaid Other | Source: Ambulatory Visit | Attending: Orthopaedic Surgery | Admitting: Orthopaedic Surgery

## 2013-04-27 ENCOUNTER — Encounter (HOSPITAL_COMMUNITY): Payer: Self-pay | Admitting: Certified Registered Nurse Anesthetist

## 2013-04-27 ENCOUNTER — Encounter (HOSPITAL_COMMUNITY): Payer: Self-pay | Admitting: *Deleted

## 2013-04-27 ENCOUNTER — Encounter (HOSPITAL_COMMUNITY)
Admission: RE | Disposition: A | Discharge status: Home Health | Payer: Self-pay | Source: Ambulatory Visit | Attending: Orthopaedic Surgery

## 2013-04-27 DIAGNOSIS — I509 Heart failure, unspecified: Secondary | ICD-10-CM | POA: Diagnosis present

## 2013-04-27 DIAGNOSIS — M19019 Primary osteoarthritis, unspecified shoulder: Secondary | ICD-10-CM | POA: Diagnosis present

## 2013-04-27 DIAGNOSIS — Z9851 Tubal ligation status: Secondary | ICD-10-CM

## 2013-04-27 DIAGNOSIS — E7800 Pure hypercholesterolemia, unspecified: Secondary | ICD-10-CM | POA: Diagnosis present

## 2013-04-27 DIAGNOSIS — Z961 Presence of intraocular lens: Secondary | ICD-10-CM

## 2013-04-27 DIAGNOSIS — Z7901 Long term (current) use of anticoagulants: Secondary | ICD-10-CM

## 2013-04-27 DIAGNOSIS — F411 Generalized anxiety disorder: Secondary | ICD-10-CM | POA: Diagnosis present

## 2013-04-27 DIAGNOSIS — K219 Gastro-esophageal reflux disease without esophagitis: Secondary | ICD-10-CM | POA: Diagnosis present

## 2013-04-27 DIAGNOSIS — Z886 Allergy status to analgesic agent status: Secondary | ICD-10-CM

## 2013-04-27 DIAGNOSIS — M1712 Unilateral primary osteoarthritis, left knee: Secondary | ICD-10-CM | POA: Diagnosis present

## 2013-04-27 DIAGNOSIS — Z882 Allergy status to sulfonamides status: Secondary | ICD-10-CM

## 2013-04-27 DIAGNOSIS — Z9849 Cataract extraction status, unspecified eye: Secondary | ICD-10-CM

## 2013-04-27 DIAGNOSIS — Z91041 Radiographic dye allergy status: Secondary | ICD-10-CM

## 2013-04-27 DIAGNOSIS — Z9089 Acquired absence of other organs: Secondary | ICD-10-CM

## 2013-04-27 DIAGNOSIS — Z91018 Allergy to other foods: Secondary | ICD-10-CM

## 2013-04-27 DIAGNOSIS — K589 Irritable bowel syndrome without diarrhea: Secondary | ICD-10-CM | POA: Diagnosis present

## 2013-04-27 DIAGNOSIS — Z96659 Presence of unspecified artificial knee joint: Secondary | ICD-10-CM

## 2013-04-27 DIAGNOSIS — Z91013 Allergy to seafood: Secondary | ICD-10-CM

## 2013-04-27 DIAGNOSIS — Z91012 Allergy to eggs: Secondary | ICD-10-CM

## 2013-04-27 DIAGNOSIS — Z79899 Other long term (current) drug therapy: Secondary | ICD-10-CM

## 2013-04-27 DIAGNOSIS — I10 Essential (primary) hypertension: Secondary | ICD-10-CM | POA: Diagnosis present

## 2013-04-27 DIAGNOSIS — Z96619 Presence of unspecified artificial shoulder joint: Secondary | ICD-10-CM

## 2013-04-27 DIAGNOSIS — M1710 Unilateral primary osteoarthritis, unspecified knee: Principal | ICD-10-CM | POA: Diagnosis present

## 2013-04-27 HISTORY — PX: TOTAL KNEE ARTHROPLASTY: SHX125

## 2013-04-27 LAB — CBC
HCT: 39.1 % (ref 36.0–46.0)
Hemoglobin: 13.2 g/dL (ref 12.0–15.0)
MCH: 29.3 pg (ref 26.0–34.0)
MCHC: 33.8 g/dL (ref 30.0–36.0)

## 2013-04-27 SURGERY — ARTHROPLASTY, KNEE, TOTAL
Anesthesia: General | Site: Knee | Laterality: Left | Wound class: Clean

## 2013-04-27 MED ORDER — LOSARTAN POTASSIUM-HCTZ 100-12.5 MG PO TABS: 1.0000 | ORAL_TABLET | Freq: Every day | ORAL | Status: DC

## 2013-04-27 MED ORDER — ONDANSETRON HCL 4 MG PO TABS: 4.0000 mg | ORAL_TABLET | Freq: Four times a day (QID) | ORAL | Status: DC | PRN

## 2013-04-27 MED ORDER — SIMVASTATIN 20 MG PO TABS
20.0000 mg | ORAL_TABLET | Freq: Every evening | ORAL | Status: DC
  Administered 2013-04-27 – 2013-04-29 (×3): 20 mg via ORAL
  Filled 2013-04-27 (×4): qty 1

## 2013-04-27 MED ORDER — LOSARTAN POTASSIUM 50 MG PO TABS
100.0000 mg | ORAL_TABLET | Freq: Every day | ORAL | Status: DC
  Administered 2013-04-27 – 2013-04-30 (×4): 100 mg via ORAL
  Filled 2013-04-27 (×4): qty 2

## 2013-04-27 MED ORDER — HYDROMORPHONE HCL PF 1 MG/ML IJ SOLN
0.5000 mg | INTRAMUSCULAR | Status: DC | PRN
  Administered 2013-04-27 – 2013-04-28 (×3): 1 mg via INTRAVENOUS
  Filled 2013-04-27 (×3): qty 1

## 2013-04-27 MED ORDER — MENTHOL 3 MG MT LOZG: 1.0000 | LOZENGE | OROMUCOSAL | Status: DC | PRN

## 2013-04-27 MED ORDER — ZOLPIDEM TARTRATE 5 MG PO TABS: 5.0000 mg | ORAL_TABLET | Freq: Every evening | ORAL | Status: DC | PRN

## 2013-04-27 MED ORDER — PANTOPRAZOLE SODIUM 40 MG PO TBEC
40.0000 mg | DELAYED_RELEASE_TABLET | Freq: Every day | ORAL | Status: DC
  Administered 2013-04-28 – 2013-04-30 (×3): 40 mg via ORAL
  Filled 2013-04-27 (×2): qty 1

## 2013-04-27 MED ORDER — DIPHENHYDRAMINE HCL 12.5 MG/5ML PO ELIX
12.5000 mg | ORAL_SOLUTION | ORAL | Status: DC | PRN
  Administered 2013-04-27 – 2013-04-30 (×5): 25 mg via ORAL
  Filled 2013-04-27 (×5): qty 10

## 2013-04-27 MED ORDER — TRANEXAMIC ACID 100 MG/ML IV SOLN
1000.0000 mg | INTRAVENOUS | Status: AC
  Administered 2013-04-27: 1000 mg via INTRAVENOUS
  Filled 2013-04-27: qty 10

## 2013-04-27 MED ORDER — METHOCARBAMOL 100 MG/ML IJ SOLN
500.0000 mg | Freq: Four times a day (QID) | INTRAMUSCULAR | Status: DC | PRN
  Filled 2013-04-27: qty 5

## 2013-04-27 MED ORDER — DEXAMETHASONE SODIUM PHOSPHATE 4 MG/ML IJ SOLN
INTRAMUSCULAR | Status: DC | PRN
  Administered 2013-04-27: 4 mg via INTRAVENOUS

## 2013-04-27 MED ORDER — BISACODYL 10 MG RE SUPP: 10.0000 mg | Freq: Every day | RECTAL | Status: DC | PRN

## 2013-04-27 MED ORDER — FENTANYL CITRATE 0.05 MG/ML IJ SOLN
INTRAMUSCULAR | Status: DC | PRN
  Administered 2013-04-27: 150 ug via INTRAVENOUS
  Administered 2013-04-27 (×7): 50 ug via INTRAVENOUS

## 2013-04-27 MED ORDER — ALBUTEROL SULFATE (5 MG/ML) 0.5% IN NEBU: 2.5000 mg | INHALATION_SOLUTION | Freq: Four times a day (QID) | RESPIRATORY_TRACT | Status: DC | PRN

## 2013-04-27 MED ORDER — POTASSIUM CHLORIDE CRYS ER 20 MEQ PO TBCR
20.0000 meq | EXTENDED_RELEASE_TABLET | Freq: Two times a day (BID) | ORAL | Status: DC
  Administered 2013-04-27 – 2013-04-30 (×7): 20 meq via ORAL
  Filled 2013-04-27 (×7): qty 1

## 2013-04-27 MED ORDER — ROCURONIUM BROMIDE 100 MG/10ML IV SOLN
INTRAVENOUS | Status: DC | PRN
  Administered 2013-04-27: 50 mg via INTRAVENOUS

## 2013-04-27 MED ORDER — METHOCARBAMOL 500 MG PO TABS
ORAL_TABLET | ORAL | Status: AC
  Administered 2013-04-27: 500 mg
  Filled 2013-04-27: qty 1

## 2013-04-27 MED ORDER — FUROSEMIDE 80 MG PO TABS
80.0000 mg | ORAL_TABLET | Freq: Every day | ORAL | Status: DC
  Administered 2013-04-28 – 2013-04-29 (×2): 80 mg via ORAL
  Filled 2013-04-27 (×3): qty 1

## 2013-04-27 MED ORDER — LACTATED RINGERS IV SOLN
INTRAVENOUS | Status: DC
  Administered 2013-04-27: 10:00:00 via INTRAVENOUS

## 2013-04-27 MED ORDER — METHOCARBAMOL 500 MG PO TABS
500.0000 mg | ORAL_TABLET | Freq: Four times a day (QID) | ORAL | Status: DC | PRN
  Administered 2013-04-28 – 2013-04-30 (×7): 500 mg via ORAL
  Filled 2013-04-27 (×8): qty 1

## 2013-04-27 MED ORDER — CITALOPRAM HYDROBROMIDE 20 MG PO TABS
20.0000 mg | ORAL_TABLET | Freq: Every day | ORAL | Status: DC
  Administered 2013-04-28 – 2013-04-30 (×3): 20 mg via ORAL
  Filled 2013-04-27 (×3): qty 1

## 2013-04-27 MED ORDER — METOCLOPRAMIDE HCL 10 MG PO TABS: 5.0000 mg | ORAL_TABLET | Freq: Three times a day (TID) | ORAL | Status: DC | PRN

## 2013-04-27 MED ORDER — DOCUSATE SODIUM 100 MG PO CAPS
100.0000 mg | ORAL_CAPSULE | Freq: Two times a day (BID) | ORAL | Status: DC
  Administered 2013-04-27 – 2013-04-30 (×6): 100 mg via ORAL
  Filled 2013-04-27 (×7): qty 1

## 2013-04-27 MED ORDER — PHENOL 1.4 % MT LIQD: 1.0000 | OROMUCOSAL | Status: DC | PRN

## 2013-04-27 MED ORDER — METOCLOPRAMIDE HCL 5 MG/ML IJ SOLN: 5.0000 mg | Freq: Three times a day (TID) | INTRAMUSCULAR | Status: DC | PRN

## 2013-04-27 MED ORDER — ENOXAPARIN SODIUM 30 MG/0.3ML ~~LOC~~ SOLN
30.0000 mg | Freq: Two times a day (BID) | SUBCUTANEOUS | Status: DC
  Administered 2013-04-28 – 2013-04-30 (×5): 30 mg via SUBCUTANEOUS
  Filled 2013-04-27 (×8): qty 0.3

## 2013-04-27 MED ORDER — PHENYLEPHRINE HCL 10 MG/ML IJ SOLN
INTRAMUSCULAR | Status: DC | PRN
  Administered 2013-04-27 (×4): 80 ug via INTRAVENOUS

## 2013-04-27 MED ORDER — OXYCODONE HCL 5 MG PO TABS: 5.0000 mg | ORAL_TABLET | Freq: Once | ORAL | Status: DC | PRN

## 2013-04-27 MED ORDER — SODIUM CHLORIDE 0.9 % IJ SOLN
INTRAMUSCULAR | Status: DC | PRN
  Administered 2013-04-27: 12:00:00

## 2013-04-27 MED ORDER — HYDROCODONE-ACETAMINOPHEN 7.5-325 MG PO TABS
1.0000 | ORAL_TABLET | ORAL | Status: DC | PRN
  Administered 2013-04-27 – 2013-04-30 (×11): 2 via ORAL
  Filled 2013-04-27 (×10): qty 2

## 2013-04-27 MED ORDER — WARFARIN SODIUM 7.5 MG PO TABS
7.5000 mg | ORAL_TABLET | Freq: Once | ORAL | Status: AC
  Administered 2013-04-27: 7.5 mg via ORAL
  Filled 2013-04-27: qty 1

## 2013-04-27 MED ORDER — SODIUM CHLORIDE 0.9 % IR SOLN
Status: DC | PRN
  Administered 2013-04-27: 3000 mL

## 2013-04-27 MED ORDER — MIDAZOLAM HCL 5 MG/5ML IJ SOLN
INTRAMUSCULAR | Status: DC | PRN
  Administered 2013-04-27: 2 mg via INTRAVENOUS

## 2013-04-27 MED ORDER — GLYCOPYRROLATE 0.2 MG/ML IJ SOLN
INTRAMUSCULAR | Status: DC | PRN
  Administered 2013-04-27: 0.6 mg via INTRAVENOUS
  Administered 2013-04-27: 0.2 mg via INTRAVENOUS

## 2013-04-27 MED ORDER — ONDANSETRON HCL 4 MG/2ML IJ SOLN
INTRAMUSCULAR | Status: DC | PRN
  Administered 2013-04-27: 4 mg via INTRAVENOUS

## 2013-04-27 MED ORDER — SUCCINYLCHOLINE CHLORIDE 20 MG/ML IJ SOLN
INTRAMUSCULAR | Status: DC | PRN
  Administered 2013-04-27: 120 mg via INTRAVENOUS

## 2013-04-27 MED ORDER — OXYCODONE HCL 5 MG PO TABS
ORAL_TABLET | ORAL | Status: AC
  Administered 2013-04-27: 5 mg
  Filled 2013-04-27: qty 1

## 2013-04-27 MED ORDER — HYDROMORPHONE HCL PF 1 MG/ML IJ SOLN
INTRAMUSCULAR | Status: AC
  Filled 2013-04-27: qty 1

## 2013-04-27 MED ORDER — HYDROMORPHONE HCL PF 1 MG/ML IJ SOLN
INTRAMUSCULAR | Status: AC
  Administered 2013-04-27: 0.5 mg
  Filled 2013-04-27: qty 1

## 2013-04-27 MED ORDER — ACETAMINOPHEN 325 MG PO TABS
650.0000 mg | ORAL_TABLET | Freq: Four times a day (QID) | ORAL | Status: DC | PRN
  Administered 2013-04-28 – 2013-04-29 (×3): 650 mg via ORAL
  Filled 2013-04-27 (×5): qty 2

## 2013-04-27 MED ORDER — ONDANSETRON HCL 4 MG/2ML IJ SOLN: 4.0000 mg | Freq: Once | INTRAMUSCULAR | Status: DC | PRN

## 2013-04-27 MED ORDER — POTASSIUM CHLORIDE 2 MEQ/ML IV SOLN
INTRAVENOUS | Status: DC
  Administered 2013-04-27: 22:00:00 via INTRAVENOUS
  Filled 2013-04-27 (×3): qty 1000

## 2013-04-27 MED ORDER — FERROUS SULFATE 325 (65 FE) MG PO TABS
325.0000 mg | ORAL_TABLET | Freq: Every day | ORAL | Status: DC
  Administered 2013-04-28 – 2013-04-30 (×3): 325 mg via ORAL
  Filled 2013-04-27 (×4): qty 1

## 2013-04-27 MED ORDER — PROPOFOL 10 MG/ML IV BOLUS
INTRAVENOUS | Status: DC | PRN
  Administered 2013-04-27: 200 mg via INTRAVENOUS

## 2013-04-27 MED ORDER — ALBUTEROL SULFATE HFA 108 (90 BASE) MCG/ACT IN AERS
2.0000 | INHALATION_SPRAY | Freq: Four times a day (QID) | RESPIRATORY_TRACT | Status: DC | PRN
  Filled 2013-04-27: qty 6.7

## 2013-04-27 MED ORDER — LIDOCAINE HCL (CARDIAC) 20 MG/ML IV SOLN
INTRAVENOUS | Status: DC | PRN
  Administered 2013-04-27: 100 mg via INTRAVENOUS

## 2013-04-27 MED ORDER — VITAMIN D (ERGOCALCIFEROL) 1.25 MG (50000 UNIT) PO CAPS
50000.0000 [IU] | ORAL_CAPSULE | ORAL | Status: DC
  Administered 2013-04-28: 50000 [IU] via ORAL
  Filled 2013-04-27: qty 1

## 2013-04-27 MED ORDER — OXYCODONE HCL 5 MG/5ML PO SOLN: 5.0000 mg | Freq: Once | ORAL | Status: DC | PRN

## 2013-04-27 MED ORDER — ALBUTEROL SULFATE HFA 108 (90 BASE) MCG/ACT IN AERS: 2.0000 | INHALATION_SPRAY | Freq: Four times a day (QID) | RESPIRATORY_TRACT | Status: DC | PRN

## 2013-04-27 MED ORDER — HYDROMORPHONE HCL PF 1 MG/ML IJ SOLN
0.2500 mg | INTRAMUSCULAR | Status: DC | PRN
  Administered 2013-04-27 (×4): 0.5 mg via INTRAVENOUS

## 2013-04-27 MED ORDER — LACTATED RINGERS IV SOLN
INTRAVENOUS | Status: DC | PRN
  Administered 2013-04-27 (×2): via INTRAVENOUS

## 2013-04-27 MED ORDER — ONDANSETRON HCL 4 MG/2ML IJ SOLN: 4.0000 mg | Freq: Four times a day (QID) | INTRAMUSCULAR | Status: DC | PRN

## 2013-04-27 MED ORDER — WARFARIN - PHARMACIST DOSING INPATIENT: Freq: Every day | Status: DC

## 2013-04-27 MED ORDER — ACETAMINOPHEN 650 MG RE SUPP: 650.0000 mg | Freq: Four times a day (QID) | RECTAL | Status: DC | PRN

## 2013-04-27 MED ORDER — CEFAZOLIN SODIUM-DEXTROSE 2-3 GM-% IV SOLR
2.0000 g | Freq: Four times a day (QID) | INTRAVENOUS | Status: AC
  Administered 2013-04-27 – 2013-04-28 (×2): 2 g via INTRAVENOUS
  Filled 2013-04-27 (×3): qty 50

## 2013-04-27 MED ORDER — NEOSTIGMINE METHYLSULFATE 1 MG/ML IJ SOLN
INTRAMUSCULAR | Status: DC | PRN
  Administered 2013-04-27: 4 mg via INTRAVENOUS

## 2013-04-27 MED ORDER — BUPIVACAINE LIPOSOME 1.3 % IJ SUSP
20.0000 mL | INTRAMUSCULAR | Status: DC
  Filled 2013-04-27: qty 20

## 2013-04-27 MED ORDER — HYDROCHLOROTHIAZIDE 12.5 MG PO CAPS
12.5000 mg | ORAL_CAPSULE | Freq: Every day | ORAL | Status: DC
  Administered 2013-04-27 – 2013-04-30 (×3): 12.5 mg via ORAL
  Filled 2013-04-27 (×4): qty 1

## 2013-04-27 SURGICAL SUPPLY — 74 items
APL SKNCLS STERI-STRIP NONHPOA (GAUZE/BANDAGES/DRESSINGS) ×1
BANDAGE ELASTIC 4 VELCRO ST LF (GAUZE/BANDAGES/DRESSINGS) ×1 IMPLANT
BANDAGE ELASTIC 6 VELCRO ST LF (GAUZE/BANDAGES/DRESSINGS) ×1 IMPLANT
BANDAGE ESMARK 6X9 LF (GAUZE/BANDAGES/DRESSINGS) ×1 IMPLANT
BANDAGE GAUZE ELAST BULKY 4 IN (GAUZE/BANDAGES/DRESSINGS) ×3 IMPLANT
BENZOIN TINCTURE PRP APPL 2/3 (GAUZE/BANDAGES/DRESSINGS) ×1 IMPLANT
BLADE SAGITTAL 25.0X1.19X90 (BLADE) ×2 IMPLANT
BLADE SURG ROTATE 9660 (MISCELLANEOUS) IMPLANT
BNDG CMPR 9X6 STRL LF SNTH (GAUZE/BANDAGES/DRESSINGS) ×1
BNDG CMPR MED 10X6 ELC LF (GAUZE/BANDAGES/DRESSINGS)
BNDG ELASTIC 6X10 VLCR STRL LF (GAUZE/BANDAGES/DRESSINGS) ×1 IMPLANT
BNDG ESMARK 6X9 LF (GAUZE/BANDAGES/DRESSINGS) ×2
BOWL SMART MIX CTS (DISPOSABLE) ×2 IMPLANT
CAP UPCHARGE REVISION TRAY ×1 IMPLANT
CAPT RP KNEE ×1 IMPLANT
CEMENT HV SMART SET (Cement) ×4 IMPLANT
CLOTH BEACON ORANGE TIMEOUT ST (SAFETY) ×2 IMPLANT
COVER SURGICAL LIGHT HANDLE (MISCELLANEOUS) ×2 IMPLANT
CUFF TOURNIQUET SINGLE 34IN LL (TOURNIQUET CUFF) ×2 IMPLANT
CUFF TOURNIQUET SINGLE 44IN (TOURNIQUET CUFF) IMPLANT
DRAPE EXTREMITY T 121X128X90 (DRAPE) ×2 IMPLANT
DRAPE INCISE 23X17 IOBAN STRL (DRAPES) ×1
DRAPE INCISE 23X17 STRL (DRAPES) IMPLANT
DRAPE INCISE IOBAN 23X17 STRL (DRAPES) ×1 IMPLANT
DRAPE PROXIMA HALF (DRAPES) ×2 IMPLANT
DRAPE U-SHAPE 47X51 STRL (DRAPES) ×2 IMPLANT
DRSG ADAPTIC 3X8 NADH LF (GAUZE/BANDAGES/DRESSINGS) ×1 IMPLANT
DRSG PAD ABDOMINAL 8X10 ST (GAUZE/BANDAGES/DRESSINGS) ×1 IMPLANT
DURAPREP 26ML APPLICATOR (WOUND CARE) ×2 IMPLANT
ELECT REM PT RETURN 9FT ADLT (ELECTROSURGICAL) ×2
ELECTRODE REM PT RTRN 9FT ADLT (ELECTROSURGICAL) ×1 IMPLANT
FACESHIELD LNG OPTICON STERILE (SAFETY) ×3 IMPLANT
GAUZE XEROFORM 5X9 LF (GAUZE/BANDAGES/DRESSINGS) ×1 IMPLANT
GLOVE BIO SURGEON STRL SZ8.5 (GLOVE) ×2 IMPLANT
GLOVE BIOGEL PI IND STRL 8 (GLOVE) ×1 IMPLANT
GLOVE BIOGEL PI IND STRL 8.5 (GLOVE) ×1 IMPLANT
GLOVE BIOGEL PI INDICATOR 8 (GLOVE) ×1
GLOVE BIOGEL PI INDICATOR 8.5 (GLOVE) ×1
GLOVE SS BIOGEL STRL SZ 8 (GLOVE) ×1 IMPLANT
GLOVE SUPERSENSE BIOGEL SZ 8 (GLOVE) ×1
GOWN PREVENTION PLUS XLARGE (GOWN DISPOSABLE) ×2 IMPLANT
GOWN PREVENTION PLUS XXLARGE (GOWN DISPOSABLE) ×2 IMPLANT
GOWN STRL NON-REIN LRG LVL3 (GOWN DISPOSABLE) ×3 IMPLANT
HANDPIECE INTERPULSE COAX TIP (DISPOSABLE) ×2
HOOD PEEL AWAY FACE SHEILD DIS (HOOD) ×2 IMPLANT
IMMOBILIZER KNEE 20 (SOFTGOODS)
IMMOBILIZER KNEE 20 THIGH 36 (SOFTGOODS) IMPLANT
IMMOBILIZER KNEE 22 UNIV (SOFTGOODS) ×2 IMPLANT
IMMOBILIZER KNEE 24 THIGH 36 (MISCELLANEOUS) IMPLANT
IMMOBILIZER KNEE 24 UNIV (MISCELLANEOUS)
KIT BASIN OR (CUSTOM PROCEDURE TRAY) ×2 IMPLANT
KIT ROOM TURNOVER OR (KITS) ×2 IMPLANT
MANIFOLD NEPTUNE II (INSTRUMENTS) ×2 IMPLANT
NDL HYPO 21X1 ECLIPSE (NEEDLE) IMPLANT
NEEDLE HYPO 21X1 ECLIPSE (NEEDLE) ×2 IMPLANT
NS IRRIG 1000ML POUR BTL (IV SOLUTION) ×2 IMPLANT
PACK TOTAL JOINT (CUSTOM PROCEDURE TRAY) ×2 IMPLANT
PAD ARMBOARD 7.5X6 YLW CONV (MISCELLANEOUS) ×4 IMPLANT
SET HNDPC FAN SPRY TIP SCT (DISPOSABLE) ×1 IMPLANT
SPONGE GAUZE 4X4 12PLY (GAUZE/BANDAGES/DRESSINGS) ×2 IMPLANT
STAPLER VISISTAT 35W (STAPLE) IMPLANT
STRIP CLOSURE SKIN 1/2X4 (GAUZE/BANDAGES/DRESSINGS) ×2 IMPLANT
SUCTION FRAZIER TIP 10 FR DISP (SUCTIONS) IMPLANT
SUT MNCRL AB 3-0 PS2 18 (SUTURE) IMPLANT
SUT VIC AB 0 CT1 27 (SUTURE) ×4
SUT VIC AB 0 CT1 27XBRD ANBCTR (SUTURE) ×2 IMPLANT
SUT VIC AB 2-0 CT1 27 (SUTURE) ×6
SUT VIC AB 2-0 CT1 TAPERPNT 27 (SUTURE) ×2 IMPLANT
SUT VLOC 180 0 24IN GS25 (SUTURE) ×2 IMPLANT
SYR 50ML LL SCALE MARK (SYRINGE) ×1 IMPLANT
TOWEL OR 17X24 6PK STRL BLUE (TOWEL DISPOSABLE) ×2 IMPLANT
TOWEL OR 17X26 10 PK STRL BLUE (TOWEL DISPOSABLE) ×2 IMPLANT
TRAY FOLEY CATH 14FR (SET/KITS/TRAYS/PACK) ×1 IMPLANT
WATER STERILE IRR 1000ML POUR (IV SOLUTION) ×4 IMPLANT

## 2013-04-27 NOTE — Op Note (Addendum)
PREOP DIAGNOSIS: DJD LEFT KNEE POSTOP DIAGNOSIS:  same PROCEDURE: LEFT TKR ANESTHESIA: General ATTENDING SURGEON: Marzella Miracle G ASSISTANT: Lindwood Qua PA and April Green RNFA  INDICATIONS FOR PROCEDURE: [name] is a 62 y.o. female who has struggled for a long time with pain due to degenerative arthritis of the left knee.  The patient has failed many conservative non-operative measures and at this point has pain which limits the ability to sleep and walk.  The patient is offered total knee replacement.  Informed operative consent was obtained after discussion of possible risks of anesthesia, infection, neurovascular injury, DVT, and death.  The importance of the post-operative rehabilitation protocol to optimize result was stressed extensively with the patient.  SUMMARY OF FINDINGS AND PROCEDURE:  [name] was taken to the operative suite where under the above anesthesia a left knee replacement was performed.  There were advanced degenerative changes and the bone quality was good.  We used the DePuy system and placed size standard femur, 3 MBT tibia, 38 mm all polyethylene patella, and a size 10 mm spacer.  The patient was admitted for appropriate post-op care to include perioperative antibiotics and mechanical and pharmacologic measures for DVT prophylaxis.  DESCRIPTION OF PROCEDURE:  [name] was taken to the operative suite where the above anesthesia was applied.  The patient was positioned supine and prepped and draped in normal sterile fashion.  An appropriate time out was performed.  After the administration of Kefzol pre-op antibiotic the leg was elevated and exsanguinated and a tourniquet inflated.  A standard longitudinal incision was made on the anterior knee.  Dissection was carried down to the extensor mechanism.  All appropriate anti-infective measures were used including the pre-operative antibiotic, betadine impregnated drape, and closed hooded exhaust  systems for each member of the surgical team.  A medial parapatellar incision was made in the extensor mechanism and the knee cap flipped and the knee flexed.  Some residual meniscal tissues were removed along with any remaining ACL/PCL tissue.  A guide was placed on the tibia and a flat cut was made on it's superior surface.  An intramedullary guide was placed in the femur and was utilized to make anterior and posterior cuts creating an appropriate flexion gap.  A second intramedullary guide was placed in the femur to make a distal cut properly balancing the knee with an extension gap equal to the flexion gap.  The three bones sized to the above mentioned sizes and the appropriate guides were placed and utilized.  A trial reduction was done and the knee easily came to full extension and the patella tracked well on flexion.  The trial components were removed and all bones were cleaned with pulsatile lavage and then dried thoroughly.  Cement was mixed and was pressurized onto the bones followed by placement of the aforementioned components.  Excess cement was trimmed and pressure was held on the components until the cement had hardened.  The tourniquet was deflated and a small amount of bleeding was controlled with cautery and pressure.  The knee was irrigated thoroughly.  The extensor mechanism was re-approximated with V-loc suture in running fashion.  The knee was flexed and the repair was solid.  I injected the capsule and subQ with Exparel mixed with saline. The subcutaneous tissues were re-approximated with #0 and #2-0 vicryl and the skin closed with a subcuticular stitch and steristrips.  A sterile dressing was applied.  Intraoperative fluids, EBL, and tourniquet time can be obtained from  anesthesia records.  DISPOSITION:  The patient was taken to recovery room in stable condition and admitted for appropriate post-op care to include peri-operative antibiotic and DVT prophylaxis with mechanical and  pharmacologic measures.  Shreyas Piatkowski G 04/27/2013, 12:14 PM

## 2013-04-27 NOTE — Transfer of Care (Signed)
Immediate Anesthesia Transfer of Care Note  Patient: [name]  Procedure(s) Performed: Procedure(s) with comments: Left TOTAL KNEE ARTHROPLASTY With Revision Tibial Component (Left) - Left total knee replacement with revision tibial component  Patient Location: PACU  Anesthesia Type:General  Level of Consciousness: awake, oriented and patient cooperative  Airway & Oxygen Therapy: Patient Spontanous Breathing and Patient connected to nasal cannula oxygen  Post-op Assessment: Report given to PACU RN, Post -op Vital signs reviewed and stable and Patient moving all extremities X 4  Post vital signs: Reviewed and stable  Complications: No apparent anesthesia complications

## 2013-04-27 NOTE — Discharge Instructions (Signed)
Continue ice and elevation May change dressing as needed. Continue Coumadin for 2 weeks. Weightbearing as tolerated. Will need home care agency for Coumadin protocol and therapy. Return to office in 2 weeks. [phone]

## 2013-04-27 NOTE — Progress Notes (Addendum)
ANTICOAGULATION CONSULT NOTE - Initial Consult  Pharmacy Consult for Coumadin Indication: VTE prophylaxis  Allergies  Allergen Reactions  . Mushroom Extract Complex Hives and Itching  . Shellfish Allergy Hives and Itching  . Eggs Or Egg-Derived Products Nausea And Vomiting  . Aspirin     Stomach bleeding  . Betadine [Povidone Iodine]     unknown  . Coconut Flavor Itching  . Codeine Itching  . Iodine     unknown   . Ivp Dye [Iodinated Diagnostic Agents]     unknown   . Sulfa Antibiotics Hives and Itching   Patient Measurements: Weight 121.7 kg on 04/22/13 Height 160 cm on 04/22/13 Vital Signs: Temp: 99 F (37.2 C) (09/09 1749) Temp src: Oral (09/09 1749) BP: 147/90 mmHg (09/09 1749) Pulse Rate: 92 (09/09 1749) Labs: Baseline INR from 04/22/13 = 1.01 Baseline PT from 04/22/13 = 13.1 CBC from 04/22/13 = Hgb 14.3, Hct 42.3, Plts 213  Medical History: Past Medical History  Diagnosis Date  . Complication of anesthesia     @ [place].....COULDN'T GET HER AWAKE....FOR  HERNIA SURGERY... PLACED ON VENTILATOR  . CHF (congestive heart failure)     [name].......LAST OFFICE VISIT WAS A FEW AGO  . Bronchitis     USES NEB  AS NEEDED  . GERD (gastroesophageal reflux disease)     TAKES PRILOSEC BID  . 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 IBS (irritable bowel syndrome)   . Hypercholesteremia   . Anxiety     ANXIETY ONCE....PLACED ON CELEXA, controlling history of panic attacks   . Sleep difficulties     pt. states she had sleep eval > 10 yrs. ago in [place], no apnea found  . Arthritis     shoulder- R, L knee   . Hypertension     dr Allyson Sabal   Medications:  Prescriptions prior to admission  Medication Sig Dispense Refill  . citalopram (CELEXA) 20 MG tablet Take 20 mg by mouth daily.      . furosemide (LASIX) 80 MG tablet Take 80 mg by mouth daily.       Marland Kitchen HYDROcodone-acetaminophen (NORCO/VICODIN) 5-325 MG per tablet Take 1 tablet by mouth 2 (two) times  daily as needed for pain.      Marland Kitchen losartan-hydrochlorothiazide (HYZAAR) 100-12.5 MG per tablet Take 1 tablet by mouth daily.      . meloxicam (MOBIC) 7.5 MG tablet Take 7.5 mg by mouth 2 (two) times daily as needed for pain.      Marland Kitchen omeprazole (PRILOSEC) 20 MG capsule Take 20 mg by mouth 2 (two) times daily.      . potassium chloride SA (K-DUR,KLOR-CON) 20 MEQ tablet Take 20 mEq by mouth 2 (two) times daily.      . simvastatin (ZOCOR) 20 MG tablet Take 20 mg by mouth every evening.      . Vitamin D, Ergocalciferol, (DRISDOL) 50000 UNITS CAPS Take 50,000 Units by mouth every 7 (seven) days. On Wednesday      . albuterol (PROVENTIL HFA;VENTOLIN HFA) 108 (90 BASE) MCG/ACT inhaler Inhale 2 puffs into the lungs every 6 (six) hours as needed for wheezing.      Marland Kitchen albuterol (PROVENTIL) (2.5 MG/3ML) 0.083% nebulizer solution Take 2.5 mg by nebulization every 6 (six) hours as needed for wheezing.      Marland Kitchen oxyCODONE-acetaminophen (PERCOCET/ROXICET) 5-325 MG per tablet Take 1 tablet by mouth every 4 (four) hours as needed for pain.  Assessment: 62 YOF status post left TKA to start Coumadin for VTE prophylaxis. Patient was not on anticoagulation prior to admission. Baseline INR and CBC were wnl on 04/22/13. Only small amount of bleeding in procedure documented as controlled with cautery and pressure per the Op Note. Note that patient has history of GIB on aspirin.   Patient is on Ancef post-op for defined number of doses and Lovenox 30mg  SQ q12h overlap with Coumadin which is to discontinue when INR >= 1.8.   Goal of Therapy:  INR 2-3   Plan:  1. Coumadin 7.5 mg po x1 tonight.  2. Continue daily PT/INR ordered.  3. Discontinue Lovenox when INR >=1.8  Link Snuffer, PharmD, BCPS Clinical Pharmacist [phone] 04/27/2013,6:18 PM

## 2013-04-27 NOTE — Anesthesia Preprocedure Evaluation (Addendum)
Anesthesia Evaluation  Patient identified by MRN, date of birth, ID band Patient awake    Reviewed: Allergy & Precautions, H&P , NPO status , Patient's Chart, lab work & pertinent test results  Airway Mallampati: I TM Distance: >3 FB Neck ROM: Full    Dental  (+) Teeth Intact and Dental Advisory Given   Pulmonary  breath sounds clear to auscultation        Cardiovascular hypertension, Pt. on medications Rhythm:Regular Rate:Normal     Neuro/Psych    GI/Hepatic GERD-  Medicated,  Endo/Other  Morbid obesity  Renal/GU      Musculoskeletal   Abdominal   Peds  Hematology   Anesthesia Other Findings   Reproductive/Obstetrics                          Anesthesia Physical Anesthesia Plan  ASA: III  Anesthesia Plan: General   Post-op Pain Management:    Induction: Intravenous  Airway Management Planned: Oral ETT  Additional Equipment:   Intra-op Plan:   Post-operative Plan: Extubation in OR  Informed Consent: I have reviewed the patients History and Physical, chart, labs and discussed the procedure including the risks, benefits and alternatives for the proposed anesthesia with the patient or authorized representative who has indicated his/her understanding and acceptance.   Dental advisory given  Plan Discussed with: CRNA, Anesthesiologist and Surgeon  Anesthesia Plan Comments:         Anesthesia Quick Evaluation

## 2013-04-27 NOTE — Anesthesia Procedure Notes (Signed)
Procedure Name: Intubation Date/Time: 04/27/2013 10:40 AM Performed by: Orvilla Fus A Pre-anesthesia Checklist: Patient identified, Emergency Drugs available, Suction available, Patient being monitored and Timeout performed Patient Re-evaluated:Patient Re-evaluated prior to inductionOxygen Delivery Method: Circle system utilized Preoxygenation: Pre-oxygenation with 100% oxygen Intubation Type: IV induction and Rapid sequence Ventilation: Mask ventilation without difficulty Laryngoscope Size: Mac and 3 Grade View: Grade I Tube type: Oral Tube size: 7.0 mm Number of attempts: 1 Placement Confirmation: ETT inserted through vocal cords under direct vision,  positive ETCO2 and breath sounds checked- equal and bilateral (intubation per Bronson Ing, CRNA) Secured at: 22 cm Tube secured with: Tape Dental Injury: Teeth and Oropharynx as per pre-operative assessment

## 2013-04-27 NOTE — Progress Notes (Signed)
Orthopedic Tech Progress Note Patient Details:  [name] 20-Jun-1951 [id number]  CPM Left Knee CPM Left Knee: On Left Knee Flexion (Degrees): 60 Left Knee Extension (Degrees): 0 Additional Comments: trapeze bar patient helper   Nikki Dom 04/27/2013, 1:45 PM

## 2013-04-27 NOTE — Interval H&P Note (Signed)
History and Physical Interval Note:  04/27/2013 9:40 AM  [name]  has presented today for surgery, with the diagnosis of Left knee degenerative joint disease  The various methods of treatment have been discussed with the patient and family. After consideration of risks, benefits and other options for treatment, the patient has consented to  Procedure(s) with comments: TOTAL KNEE ARTHROPLASTY (Left) - Left total knee replacement as a surgical intervention .  The patient's history has been reviewed, patient examined, no change in status, stable for surgery.  I have reviewed the patient's chart and labs.  Questions were answered to the patient's satisfaction.     [name]

## 2013-04-28 DIAGNOSIS — M79609 Pain in unspecified limb: Secondary | ICD-10-CM

## 2013-04-28 LAB — BASIC METABOLIC PANEL
BUN: 7 mg/dL (ref 6–23)
Creatinine, Ser: 0.86 mg/dL (ref 0.50–1.10)
GFR calc non Af Amer: 70 mL/min — ABNORMAL LOW (ref >90)
Glucose, Bld: 114 mg/dL — ABNORMAL HIGH (ref 70–99)
Potassium: 3.5 mEq/L (ref 3.5–5.1)

## 2013-04-28 LAB — CBC
HCT: 36.6 % (ref 36.0–46.0)
Hemoglobin: 12.2 g/dL (ref 12.0–15.0)
MCHC: 33.3 g/dL (ref 30.0–36.0)
MCV: 86.9 fL (ref 78.0–100.0)

## 2013-04-28 LAB — PROTIME-INR: INR: 1.07 (ref 0.00–1.49)

## 2013-04-28 MED ORDER — MORPHINE SULFATE 2 MG/ML IJ SOLN
1.0000 mg | INTRAMUSCULAR | Status: DC | PRN
  Administered 2013-04-28 – 2013-04-29 (×8): 1 mg via INTRAVENOUS
  Filled 2013-04-28 (×8): qty 1

## 2013-04-28 MED ORDER — WARFARIN SODIUM 7.5 MG PO TABS
7.5000 mg | ORAL_TABLET | Freq: Once | ORAL | Status: AC
  Administered 2013-04-28: 7.5 mg via ORAL
  Filled 2013-04-28: qty 1

## 2013-04-28 MED ORDER — HYDROXYZINE HCL 25 MG PO TABS
25.0000 mg | ORAL_TABLET | ORAL | Status: DC | PRN
  Administered 2013-04-28 – 2013-04-30 (×4): 25 mg via ORAL
  Filled 2013-04-28 (×4): qty 1

## 2013-04-28 NOTE — Progress Notes (Signed)
Orthopedic Tech Progress Note Patient Details:  KC SEDLAK April 17, 1951 [id number] On cpm at 7:55 pm LLE 0-60 Patient ID: Malissa Hippo, female   DOB: Jan 04, 1951, 62 y.o.   MRN: [id number]   Jennye Moccasin 04/28/2013, 7:57 PM

## 2013-04-28 NOTE — Progress Notes (Signed)
UR COMPLETED  

## 2013-04-28 NOTE — Evaluation (Signed)
Physical Therapy Evaluation Patient Details Name: [name] MRN: [id number] DOB: 1951/01/02 Today's Date: 04/28/2013 Time: 4098-1191 PT Time Calculation (min): 27 min  PT Assessment / Plan / Recommendation History of Present Illness  Pt is a 62 y/o female admitted s/p L TKA on 04/27/13.  Clinical Impression  Pt very lethargic during session and attributes this to morphine she received for pain prior to PT beginning. Gait training limited this session due to safety from pt's lethargy and sleepiness, however pt did well ambulating forward and backwards in ~5 ft increments. She was able to perform all mobility with min assist, and had one LOB when she came to stand initially in which she required min assist to recover. This patient is appropriate for skilled PT interventions to address functional limitations, improve safety and independence with functional mobility, and return to PLOF.    PT Assessment  Patient needs continued PT services    Follow Up Recommendations  Home health PT    Does the patient have the potential to tolerate intense rehabilitation      Barriers to Discharge        Equipment Recommendations  None recommended by PT    Recommendations for Other Services     Frequency 7X/week    Precautions / Restrictions Precautions Precautions: Knee;Fall Required Braces or Orthoses: Knee Immobilizer - Left Restrictions Weight Bearing Restrictions: Yes LLE Weight Bearing: Weight bearing as tolerated Other Position/Activity Restrictions: Reviewed towel roll under ankle/heel in chair.   Pertinent Vitals/Pain Pt received morphine prior to session beginning and rates pain as 6/10 at rest.      Mobility  Bed Mobility Bed Mobility: Not assessed (Pt received up in chair) Transfers Transfers: Sit to Stand;Stand to Sit Sit to Stand: 4: Min assist;From chair/3-in-1;With armrests Stand to Sit: 4: Min guard;To chair/3-in-1;With armrests Details for Transfer Assistance: Pt  was cued for hand placement on seated surface prior to initiating transfers. VC's also for L LE positioning during transfers. Ambulation/Gait Ambulation/Gait Assistance: 4: Min assist Ambulation Distance (Feet): 25 Feet Assistive device: Rolling walker Ambulation/Gait Assistance Details: VC's for sequencing and safety awareness with the RW. One LOB when first taking step but was able to recover with min assist. Gait Pattern: Step-to pattern;Decreased stride length;Decreased dorsiflexion - left;Decreased weight shift to left Gait velocity: decreased Stairs: No    Exercises Total Joint Exercises Ankle Circles/Pumps: 10 reps;Both Quad Sets: 15 reps;Both Heel Slides: 15 reps;Left;AROM Goniometric ROM: 6-72   PT Diagnosis: Difficulty walking;Acute pain  PT Problem List: Decreased strength;Decreased range of motion;Decreased activity tolerance;Decreased balance;Decreased mobility;Decreased knowledge of use of DME;Decreased safety awareness;Pain PT Treatment Interventions: DME instruction;Gait training;Functional mobility training;Therapeutic activities;Therapeutic exercise;Neuromuscular re-education;Patient/family education     PT Goals(Current goals can be found in the care plan section) Acute Rehab PT Goals Patient Stated Goal: To return home PT Goal Formulation: With patient Time For Goal Achievement: 05/05/13 Potential to Achieve Goals: Good  Visit Information  Last PT Received On: 04/28/13 Assistance Needed: +1 History of Present Illness: Pt is a 62 y/o female admitted s/p L TKA on 04/27/13.       Prior Functioning  Home Living Family/patient expects to be discharged to:: Private residence Living Arrangements: Alone (Aide 7 days/week 4 hrs/day family in evenings) Available Help at Discharge: Family;Available PRN/intermittently (in evenings) Type of Home: Apartment Home Layout: One level Home Equipment: Tub bench;Walker - 2 wheels;Bedside commode;Grab bars - tub/shower Prior  Function Level of Independence: Needs assistance (Independent with ambulation, aide assists with showering) Communication  Communication: No difficulties Dominant Hand:  (Ambidexterous )    Cognition  Cognition Arousal/Alertness: Lethargic;Suspect due to medications Behavior During Therapy: [facility] for tasks assessed/performed Overall Cognitive Status: Within Functional Limits for tasks assessed    Extremity/Trunk Assessment Upper Extremity Assessment Upper Extremity Assessment: Defer to OT evaluation RUE Deficits / Details: Limited range of motion of SHLD has had 2 sx. 30 degrees flex. Lower Extremity Assessment Lower Extremity Assessment: LLE deficits/detail LLE Deficits / Details: Decreased strength and AROM consistent with TKA Cervical / Trunk Assessment Cervical / Trunk Assessment: Normal   Balance Balance Balance Assessed: Yes Static Sitting Balance Static Sitting - Balance Support: Bilateral upper extremity supported;Feet supported Static Sitting - Level of Assistance: 5: Stand by assistance Static Sitting - Comment/# of Minutes: 3 Static Standing Balance Static Standing - Balance Support: Bilateral upper extremity supported Static Standing - Level of Assistance: 4: Min assist Static Standing - Comment/# of Minutes: 1  End of Session PT - End of Session Equipment Utilized During Treatment: Gait belt Activity Tolerance: Patient limited by fatigue;Patient limited by lethargy Patient left: in chair;with call bell/phone within reach;with nursing/sitter in room CPM Left Knee CPM Left Knee: Off  GP     Ruthann Cancer 04/28/2013, 11:48 AM  Ruthann Cancer, PT Acute Rehabilitation Services

## 2013-04-28 NOTE — Anesthesia Postprocedure Evaluation (Signed)
  Anesthesia Post-op Note  Patient: [name]  Procedure(s) Performed: Procedure(s) with comments: Left TOTAL KNEE ARTHROPLASTY With Revision Tibial Component (Left) - Left total knee replacement with revision tibial component  Patient Location: PACU  Anesthesia Type:General  Level of Consciousness: awake, alert , oriented and patient cooperative  Airway and Oxygen Therapy: Patient Spontanous Breathing  Post-op Pain: mild  Post-op Assessment: Post-op Vital signs reviewed, Patient's Cardiovascular Status Stable, Respiratory Function Stable, Patent Airway, No signs of Nausea or vomiting and Pain level controlled  Post-op Vital Signs: stable  Complications: No apparent anesthesia complications

## 2013-04-28 NOTE — Progress Notes (Signed)
PT Cancellation Note  Patient Details Name: [name] MRN: [id number] DOB: 1950-12-16   Cancelled Treatment:    Reason Eval/Treat Not Completed: Medical issues which prohibited therapy. Per nursing, pt has a fever, and is also complaining of calf tenderness. Pt is scheduled for Echo Doppler this afternoon to rule out DVT. PT will hold pending results.   Ruthann Cancer 04/28/2013, 3:24 PM  Ruthann Cancer, PT Acute Rehabilitation Services

## 2013-04-28 NOTE — Progress Notes (Signed)
Patient complaining of severe pain in her left calf.  No redness noted.  Patient has clear lung sounds and is not short of breath.  Patient is coughing up some sputum.  Dr. Jerl Santos made aware and will order a lower extremity doppler.

## 2013-04-28 NOTE — Progress Notes (Signed)
*  PRELIMINARY RESULTS* Vascular Ultrasound Left lower extremity venous duplex has been completed.  Preliminary findings: NEGATIVE for DVT and baker's cyst.   Farrel Demark, RDMS, RVT  04/28/2013, 4:52 PM

## 2013-04-28 NOTE — Evaluation (Signed)
Occupational Therapy Evaluation Patient Details Name: [name] MRN: [id number] DOB: 03-28-1951 Today's Date: 04/28/2013 Time: 4098-1191 OT Time Calculation (min): 44 min  OT Assessment / Plan / Recommendation History of present illness  (Pt. underwent L TKR 04/27/13 secondary to OA. )   Clinical Impression   Pt. Underwent the above procedure and is doing very well. Pt. Had other knee replaced 5 years post. Pt. Is requiring Min A to Mod A with bed mobility and transfers at this time. Pt. Is requiring Max A with LE ADL's but will have assist at home and does not want AE. Pt. Does want to work with OT to increase I and safety with toilet and tub transfers.     OT Assessment  Patient needs continued OT Services    Follow Up Recommendations  Home health OT    Barriers to Discharge      Equipment Recommendations  None recommended by OT    Recommendations for Other Services    Frequency  Min 2X/week    Precautions / Restrictions Precautions Precautions: Fall Restrictions LLE Weight Bearing: Weight bearing as tolerated   Pertinent Vitals/Pain     ADL  Eating/Feeding: Simulated;Independent Grooming: Performed;Wash/dry hands;Set up Where Assessed - Grooming: Unsupported sitting Upper Body Bathing: Simulated;Set up Where Assessed - Upper Body Bathing: Unsupported sitting Lower Body Bathing: Simulated;Moderate assistance Where Assessed - Lower Body Bathing: Unsupported sitting Upper Body Dressing: Minimal assistance;Performed Where Assessed - Upper Body Dressing: Unsupported sitting Lower Body Dressing: Maximal assistance Where Assessed - Lower Body Dressing: Unsupported sitting Toilet Transfer: Moderate assistance;Performed Toilet Transfer Method: Surveyor, minerals: Materials engineer and Hygiene: Moderate assistance Where Assessed - Toileting Clothing Manipulation and Hygiene: Sit to stand from 3-in-1 or  toilet Equipment Used: Rolling walker;Knee Immobilizer Transfers/Ambulation Related to ADLs: Mod A with sit to stand from bed and Min A with stand pivot. ADL Comments: Pt. does not want AE she states she has aid that can assist with dressing until she is able.     OT Diagnosis: Generalized weakness;Acute pain  OT Problem List: Decreased activity tolerance;Decreased knowledge of use of DME or AE;Pain OT Treatment Interventions: Self-care/ADL training;Therapeutic activities   OT Goals(Current goals can be found in the care plan section) Acute Rehab OT Goals Patient Stated Goal: go home OT Goal Formulation: With patient Time For Goal Achievement: 04/20/13 Potential to Achieve Goals: Good  Visit Information  Last OT Received On: 04/28/13 Assistance Needed: +1 History of Present Illness:  (Pt. underwent L TKR 04/27/13 secondary to OA. )       Prior Functioning     Home Living Family/patient expects to be discharged to:: Private residence Living Arrangements: Alone (Aide 7 days/week 4 hrs/day family in evenings) Available Help at Discharge: Family;Available PRN/intermittently (in evenings) Type of Home: Apartment Home Layout: One level Home Equipment: Tub bench;Walker - 2 wheels;Bedside commode;Grab bars - tub/shower Prior Function Level of Independence: Independent (ambulation) Communication Communication: No difficulties Dominant Hand:  (Ambidexterous )         Vision/Perception Vision - History Baseline Vision: Bifocals Visual History: Cataracts (recent cataract sx.)   Cognition  Cognition Arousal/Alertness: Lethargic;Suspect due to medications Behavior During Therapy: [facility] for tasks assessed/performed Overall Cognitive Status: Within Functional Limits for tasks assessed    Extremity/Trunk Assessment Upper Extremity Assessment Upper Extremity Assessment: Defer to OT evaluation RUE Deficits / Details: Limited range of motion of SHLD has had 2 sx. 30 degrees flex.  Mobility Bed Mobility Bed Mobility: Supine to Sit Supine to Sit: 4: Min assist;With rails     Exercise     Balance     End of Session OT - End of Session Equipment Utilized During Treatment: Rolling walker;Left knee immobilizer Activity Tolerance: Patient tolerated treatment well Patient left: with call bell/phone within reach;in chair Nurse Communication: Mobility status  GO     Valaree Fresquez 04/28/2013, 10:19 AM

## 2013-04-28 NOTE — Progress Notes (Signed)
Patient selected [facility] for home health RN, PT, OT [facility] called with the referral. Patient states she has at home: shower chair, cane, RW, 3:1 at home.

## 2013-04-28 NOTE — Progress Notes (Signed)
PHARMACY FOLLOW UP NOTE  Pharmacy Consult for : Coumadin Indication:  VTE prophylaxis   Dosing Weight: 122 kg  Labs:  Recent Labs  04/27/13 1855 04/28/13 0501  HGB 13.2 12.2  HCT 39.1 36.6  PLT 204 186  LABPROT  --  13.7  INR  --  1.07  CREATININE 0.90 0.86   Lab Results  Component Value Date   INR 1.07 04/28/2013   INR 1.01 04/22/2013   INR 1.55* 10/24/2011    Medications:  Prescriptions prior to admission  Medication Sig Dispense Refill  . citalopram (CELEXA) 20 MG tablet Take 20 mg by mouth daily.      . furosemide (LASIX) 80 MG tablet Take 80 mg by mouth daily.       Marland Kitchen HYDROcodone-acetaminophen (NORCO/VICODIN) 5-325 MG per tablet Take 1 tablet by mouth 2 (two) times daily as needed for pain.      Marland Kitchen losartan-hydrochlorothiazide (HYZAAR) 100-12.5 MG per tablet Take 1 tablet by mouth daily.      . meloxicam (MOBIC) 7.5 MG tablet Take 7.5 mg by mouth 2 (two) times daily as needed for pain.      Marland Kitchen omeprazole (PRILOSEC) 20 MG capsule Take 20 mg by mouth 2 (two) times daily.      . potassium chloride SA (K-DUR,KLOR-CON) 20 MEQ tablet Take 20 mEq by mouth 2 (two) times daily.      . simvastatin (ZOCOR) 20 MG tablet Take 20 mg by mouth every evening.      . Vitamin D, Ergocalciferol, (DRISDOL) 50000 UNITS CAPS Take 50,000 Units by mouth every 7 (seven) days. On Wednesday      . albuterol (PROVENTIL HFA;VENTOLIN HFA) 108 (90 BASE) MCG/ACT inhaler Inhale 2 puffs into the lungs every 6 (six) hours as needed for wheezing.      Marland Kitchen albuterol (PROVENTIL) (2.5 MG/3ML) 0.083% nebulizer solution Take 2.5 mg by nebulization every 6 (six) hours as needed for wheezing.      Marland Kitchen oxyCODONE-acetaminophen (PERCOCET/ROXICET) 5-325 MG per tablet Take 1 tablet by mouth every 4 (four) hours as needed for pain.       Scheduled:  . citalopram  20 mg Oral Daily  . docusate sodium  100 mg Oral BID  . enoxaparin (LOVENOX) injection  30 mg Subcutaneous Q12H  . ferrous sulfate  325 mg Oral Q breakfast  .  furosemide  80 mg Oral Daily  . hydrochlorothiazide  12.5 mg Oral Daily  . losartan  100 mg Oral Daily  . pantoprazole  40 mg Oral QAC breakfast  . potassium chloride SA  20 mEq Oral BID  . simvastatin  20 mg Oral QPM  . Vitamin D (Ergocalciferol)  50,000 Units Oral Q Wed  . Warfarin - Pharmacist Dosing Inpatient   Does not apply q1800   Anti-infectives   Start     Dose/Rate Route Frequency Ordered Stop   04/27/13 1930  ceFAZolin (ANCEF) IVPB 2 g/50 mL premix     2 g 100 mL/hr over 30 Minutes Intravenous Every 6 hours 04/27/13 1809 04/28/13 0334   04/27/13 0600  ceFAZolin (ANCEF) 3 g in dextrose 5 % 50 mL IVPB     3 g 160 mL/hr over 30 Minutes Intravenous On call to O.R. 04/26/13 1427 04/27/13 1045      Assessment:  POD # 1 s/p L-TKA in 62 y/o female who is on Coumadin with Lovenox bridging for VTE prophylaxis   INR 1.07,  Hgb 12.2,  No bleeding complications noted   Goal  of Therapy:  INR 2-3   Plan:  Continue Lovenox bridging until INR >= 1.8.  Repeat Coumadin 7.5 mg today.   Celestino Ackerman, Deetta Perla.D 04/28/2013, 2:22 PM

## 2013-04-28 NOTE — Progress Notes (Signed)
Subjective: 1 Day Post-Op Procedure(s) (LRB): Left TOTAL KNEE ARTHROPLASTY With Revision Tibial Component (Left) Complaining of itching with hydromorphone Activity level:  May be weightbearing as tolerated Diet tolerance:  ok Voiding:  ok Patient reports pain as 4 on 0-10 scale.    Objective: Vital signs in last 24 hours: Temp:  [98.1 F (36.7 C)-99.1 F (37.3 C)] 98.5 F (36.9 C) (09/10 4742) Pulse Rate:  [62-98] 88 (09/10 0633) Resp:  [13-21] 18 (09/10 0633) BP: (119-168)/(45-94) 119/45 mmHg (09/10 0633) SpO2:  [90 %-100 %] 95 % (09/10 0633)  Labs:  Recent Labs  04/27/13 1855 04/28/13 0501  HGB 13.2 12.2    Recent Labs  04/27/13 1855 04/28/13 0501  WBC 7.9 6.7  RBC 4.51 4.21  HCT 39.1 36.6  PLT 204 186    Recent Labs  04/27/13 1855 04/28/13 0501  NA  --  138  K  --  3.5  CL  --  101  CO2  --  28  BUN  --  7  CREATININE 0.90 0.86  GLUCOSE  --  114*  CALCIUM  --  8.7    Recent Labs  04/28/13 0501  INR 1.07    Physical Exam:  Neurologically intact ABD soft Neurovascular intact Sensation intact distally Intact pulses distally Dorsiflexion/Plantar flexion intact Incision: dressing C/D/I No cellulitis present Compartment soft  Assessment/Plan:  1 Day Post-Op Procedure(s) (LRB): Left TOTAL KNEE ARTHROPLASTY With Revision Tibial Component (Left) Advance diet Up with therapy D/C IV fluids Plan for discharge tomorrow Daily Lovenox/Coumadin for DVT prophylaxis. Change hydromorphone to morphine sulfate to hopefully relieve itching. If all goes well can discharge tomorrow. Dressing change today.    Marnesha Gagen R 04/28/2013, 8:09 AM

## 2013-04-29 ENCOUNTER — Encounter (HOSPITAL_COMMUNITY): Payer: Self-pay | Admitting: Emergency Medicine

## 2013-04-29 LAB — CBC
HCT: 35 % — ABNORMAL LOW (ref 36.0–46.0)
Hemoglobin: 11.6 g/dL — ABNORMAL LOW (ref 12.0–15.0)
WBC: 7.7 10*3/uL (ref 4.0–10.5)

## 2013-04-29 LAB — PROTIME-INR: INR: 1.63 — ABNORMAL HIGH (ref 0.00–1.49)

## 2013-04-29 MED ORDER — WARFARIN SODIUM 2.5 MG PO TABS
2.5000 mg | ORAL_TABLET | Freq: Once | ORAL | Status: AC
  Administered 2013-04-29: 2.5 mg via ORAL
  Filled 2013-04-29: qty 1

## 2013-04-29 MED ORDER — METHOCARBAMOL 500 MG PO TABS: 500.0000 mg | ORAL_TABLET | Freq: Four times a day (QID) | ORAL | Status: DC | PRN

## 2013-04-29 MED ORDER — WARFARIN SODIUM 5 MG PO TABS
ORAL_TABLET | ORAL | Status: DC
Start: 2013-04-29 — End: 2015-03-20

## 2013-04-29 MED ORDER — HYDROCODONE-ACETAMINOPHEN 7.5-325 MG PO TABS: 1.0000 | ORAL_TABLET | ORAL | Status: DC | PRN

## 2013-04-29 MED ORDER — WARFARIN VIDEO: Freq: Once | Status: DC

## 2013-04-29 MED ORDER — COUMADIN BOOK
Freq: Once | Status: DC
  Filled 2013-04-29: qty 1

## 2013-04-29 NOTE — Progress Notes (Signed)
Occupational Therapy Treatment Patient Details Name: [name] MRN: [id number] DOB: 14-Jun-1951 Today's Date: 04/29/2013 Time: 4098-1191 OT Time Calculation (min): 27 min  OT Assessment / Plan / Recommendation  History of present illness Pt is a 62 y/o female admitted s/p L TKA on 04/27/13.   OT comments  Pt making progress and reports decrease pain this afternoon for participation. Pt to continue with acute OT services to help restore PLOF to return home safely  Follow Up Recommendations  Home health OT    Barriers to Discharge       Equipment Recommendations  None recommended by OT    Recommendations for Other Services    Frequency Min 2X/week   Progress towards OT Goals Progress towards OT goals: Progressing toward goals  Plan Discharge plan remains appropriate    Precautions / Restrictions Precautions Precautions: Knee Restrictions Weight Bearing Restrictions: Yes LLE Weight Bearing: Weight bearing as tolerated   Pertinent Vitals/Pain 3/10    ADL  Grooming: Performed;Wash/dry hands;Wash/dry face;Min guard Where Assessed - Grooming: Supported standing Toilet Transfer: Performed;Minimal Dentist Method: Sit to Barista: Raised toilet seat with arms (or 3-in-1 over toilet) Toileting - Clothing Manipulation and Hygiene: Performed;Minimal assistance Where Assessed - Engineer, mining and Hygiene: Standing Tub/Shower Transfer Method: Not assessed Equipment Used: Gait belt;Rolling walker;Other (comment) (3 in 1)    OT Diagnosis:    OT Problem List:   OT Treatment Interventions:     OT Goals(current goals can now be found in the care plan section)    Visit Information  Last OT Received On: 04/29/13 Assistance Needed: +1 Reason Eval/Treat Not Completed: Pain limiting ability to participate History of Present Illness: Pt is a 62 y/o female admitted s/p L TKA on 04/27/13.    Subjective Data      Prior  Functioning       Cognition  Cognition Arousal/Alertness: Awake/alert Behavior During Therapy: WFL for tasks assessed/performed Overall Cognitive Status: Within Functional Limits for tasks assessed    Mobility  Bed Mobility Bed Mobility: Supine to Sit;Sitting - Scoot to [place] of Bed;Sit to Supine;Scooting to [facility] Supine to Sit: 4: Min assist;With rails Sitting - Scoot to Edge of Bed: 4: Min guard Sit to Supine: 4: Min assist Scooting to [facility]: 4: Min assist Transfers Transfers: Sit to Stand;Stand to Sit Sit to Stand: 4: Min assist;From chair/3-in-1;With armrests;From bed Stand to Sit: 4: Min guard;To chair/3-in-1;With armrests Details for Transfer Assistance: Pt was cued for hand placement on seated surface prior to initiating transfers and control of descent. VC's also for L LE positioning during transfers.       Balance Balance Balance Assessed: Yes Dynamic Standing Balance Dynamic Standing - Balance Support: During functional activity Dynamic Standing - Level of Assistance: 5: Stand by assistance;4: Min assist   End of Session Pt left sitting in chair with call bell in reach  GO     [name] 04/29/2013, 3:44 PM

## 2013-04-29 NOTE — Progress Notes (Signed)
OT Cancellation Note  Patient Details Name: [name] MRN: [id number] DOB: 1951/06/18   Cancelled Treatment:    Reason Eval/Treat Not Completed: Pain limiting ability to participate  Evette Georges [phone] 04/29/2013, 12:49 PM

## 2013-04-29 NOTE — Progress Notes (Signed)
Orthopedic Tech Progress Note Patient Details:  [name] 06/12/1951 [id number] On cpm at 8:15 pm LLE 0-60 Patient ID: [name], female   DOB: 12/21/1950, 62 y.o.   MRN: [id number]   [name] 04/29/2013, 8:17 PM

## 2013-04-29 NOTE — Progress Notes (Signed)
PHARMACY FOLLOW UP NOTE  Pharmacy Consult for : Coumadin Indication:  Post-op VTE prophylaxis   Dosing Weight: 122 kg  Labs:  Recent Labs  04/27/13 1855 04/28/13 0501 04/29/13 0614  HGB 13.2 12.2 11.6*  HCT 39.1 36.6 35.0*  PLT 204 186 160  LABPROT  --  13.7 18.9*  INR  --  1.07 1.63*  CREATININE 0.90 0.86  --    Lab Results  Component Value Date   INR 1.63* 04/29/2013   INR 1.07 04/28/2013   INR 1.01 04/22/2013    Medications:   Scheduled:  . citalopram  20 mg Oral Daily  . docusate sodium  100 mg Oral BID  . enoxaparin (LOVENOX) injection  30 mg Subcutaneous Q12H  . ferrous sulfate  325 mg Oral Q breakfast  . furosemide  80 mg Oral Daily  . hydrochlorothiazide  12.5 mg Oral Daily  . losartan  100 mg Oral Daily  . pantoprazole  40 mg Oral QAC breakfast  . potassium chloride SA  20 mEq Oral BID  . simvastatin  20 mg Oral QPM  . Vitamin D (Ergocalciferol)  50,000 Units Oral Q Wed  . Warfarin - Pharmacist Dosing Inpatient   Does not apply q1800     Assessment: 62 yo F POD #2 from L TKA.  Notable for never fever (Tm 101.9) over the last 24 hours.  Pt on Coumadin and Lovenox for post-op VTE prophylaxis.  INR has increased from 1.07>>1.63.  CBC down slightly but relatively stable.  No bleeding noted.   Goal of Therapy:  INR 2-3   Plan:  Continue Lovenox bridging until INR >= 1.8.  Decrease Coumadin dose to 2.5 mg today.   Follow-up INR in AM  Education materials ordered.  [employer], Pharm.D., BCPS Clinical Pharmacist Pager [phone] 04/29/2013 2:22 PM

## 2013-04-29 NOTE — Progress Notes (Signed)
Physical Therapy Treatment Patient Details Name: [name] MRN: [id number] DOB: 04-13-1951 Today's Date: 04/29/2013 Time: 7829-5621 PT Time Calculation (min): 23 min  PT Assessment / Plan / Recommendation  History of Present Illness Pt is a 62 y/o female admitted s/p L TKA on 04/27/13.   PT Comments   Pt. Did better this pm with her mobility, though still progressing somewhat slowly.  Not unexpected however, due to her body habitus.    Follow Up Recommendations  Home health PT;Supervision/Assistance - 24 hour;Supervision for mobility/OOB     Does the patient have the potential to tolerate intense rehabilitation     Barriers to Discharge        Equipment Recommendations  None recommended by PT    Recommendations for Other Services    Frequency 7X/week   Progress towards PT Goals Progress towards PT goals: Progressing toward goals  Plan Current plan remains appropriate    Precautions / Restrictions Precautions Precautions: Knee Restrictions Weight Bearing Restrictions: Yes LLE Weight Bearing: Weight bearing as tolerated   Pertinent Vitals/Pain See vitals tab     Mobility  Bed Mobility Bed Mobility: Not assessed (pt. up in recliner) Supine to Sit: 4: Min assist;With rails Sitting - Scoot to Edge of Bed: 4: Min guard Sit to Supine: 4: Min assist Scooting to [facility]: 4: Min assist Transfers Transfers: Sit to Stand;Stand to Sit Sit to Stand: 4: Min guard;From chair/3-in-1;With armrests Stand to Sit: 4: Min guard;To chair/3-in-1;With armrests Details for Transfer Assistance: hand placement cues Ambulation/Gait Ambulation/Gait Assistance: 4: Min assist Ambulation Distance (Feet): 30 Feet Assistive device: Rolling walker Ambulation/Gait Assistance Details: Pt. fatigues easily during short distance ambulation and takes rest breaks, usually bending over RW, every few feet.  Needed cues for step length and erect posture Gait Pattern: Step-to pattern;Decreased stride  length;Decreased dorsiflexion - left;Decreased weight shift to left Gait velocity: decreased    Exercises Total Joint Exercises Ankle Circles/Pumps: AROM;Both;10 reps;Seated Quad Sets: AROM;Both;10 reps;Seated Heel Slides: AAROM;Left;10 reps;Supine Hip ABduction/ADduction: AAROM;Strengthening;Left;10 reps;Supine Long Arc Quad: AAROM;Strengthening;Left;10 reps;Supine Knee Flexion: AAROM;Left;10 reps;Supine Goniometric ROM: 0-60   PT Diagnosis:    PT Problem List:   PT Treatment Interventions:     PT Goals (current goals can now be found in the care plan section) Acute Rehab PT Goals PT Goal Formulation: With patient Time For Goal Achievement: 05/05/13 Potential to Achieve Goals: Good  Visit Information  Last PT Received On: 04/29/13 Assistance Needed: +1 History of Present Illness: Pt is a 62 y/o female admitted s/p L TKA on 04/27/13.    Subjective Data  Subjective: Pt. reports fatigue with activity   Cognition  Cognition Arousal/Alertness: Awake/alert Behavior During Therapy: WFL for tasks assessed/performed Overall Cognitive Status: Within Functional Limits for tasks assessed    Balance  Balance Balance Assessed: Yes Dynamic Standing Balance Dynamic Standing - Balance Support: During functional activity Dynamic Standing - Level of Assistance: 5: Stand by assistance;4: Min assist  End of Session PT - End of Session Equipment Utilized During Treatment: Gait belt Activity Tolerance: Patient tolerated treatment well;Patient limited by fatigue;Patient limited by pain Patient left: in chair;with call bell/phone within reach;with family/visitor present Nurse Communication: Mobility status;Patient requests pain meds CPM Left Knee CPM Left Knee: On Left Knee Flexion (Degrees): 60 Left Knee Extension (Degrees): 0 Additional Comments: Pt agreeable to CPM even with pain level.   GP     Ferman Hamming 04/29/2013, 3:57 PM Weldon Picking PT Acute Rehab Services  [phone] Beeper [phone]

## 2013-04-29 NOTE — Progress Notes (Signed)
Physical Therapy Treatment Patient Details Name: [name] MRN: [id number] DOB: Oct 07, 1950 Today's Date: 04/29/2013 Time: 7829-5621 PT Time Calculation (min): 14 min  PT Assessment / Plan / Recommendation  History of Present Illness Pt is a 62 y/o female admitted s/p L TKA on 04/27/13.   PT Comments   Pt is s/p TKA resulting in the deficits listed below (see PT Problem List).  Pt will benefit from skilled PT to increase their independence and safety with mobility to allow discharge to the venue listed below. Pt reported she has a temperature and her pain level has not been controlled. Pt declined OOB this morning. Pt agreeable to bed exercises and CPM machine 0-60*. Will check back this PM for OOB and gait training.  Follow Up Recommendations  Home health PT     Does the patient have the potential to tolerate intense rehabilitation     Barriers to Discharge        Equipment Recommendations  None recommended by PT    Recommendations for Other Services    Frequency 7X/week   Progress towards PT Goals Progress towards PT goals: Not progressing toward goals - comment (temp. today and increased pain-declined OOB)  Plan Current plan remains appropriate    Precautions / Restrictions Precautions Precautions: Knee Restrictions Weight Bearing Restrictions: Yes LLE Weight Bearing: Weight bearing as tolerated   Pertinent Vitals/Pain     Mobility  Bed Mobility Bed Mobility: Not assessed Transfers Transfers: Not assessed Ambulation/Gait Ambulation/Gait Assistance: Not tested (comment)    Exercises Total Joint Exercises Ankle Circles/Pumps: AROM;Strengthening;Left;10 reps;Supine Quad Sets: AROM;Strengthening;Left;10 reps;Supine Heel Slides: AAROM;Left;10 reps;Supine Hip ABduction/ADduction: AAROM;Strengthening;Left;10 reps;Supine Long Arc Quad: AAROM;Strengthening;Left;10 reps;Supine Knee Flexion: AAROM;Left;10 reps;Supine Goniometric ROM: 0-60   PT Diagnosis:    PT  Problem List:   PT Treatment Interventions:     PT Goals (current goals can now be found in the care plan section)    Visit Information  Last PT Received On: 04/29/13 Assistance Needed: +1 History of Present Illness: Pt is a 62 y/o female admitted s/p L TKA on 04/27/13.    Subjective Data      Cognition  Cognition Arousal/Alertness: Awake/alert Behavior During Therapy: WFL for tasks assessed/performed Overall Cognitive Status: Within Functional Limits for tasks assessed    Balance     End of Session PT - End of Session Activity Tolerance: Treatment limited secondary to medical complications (Comment) (temp today and pain not well controlled with meds per patien) Patient left: in bed;with call bell/phone within reach Nurse Communication: Mobility status CPM Left Knee CPM Left Knee: On Left Knee Flexion (Degrees): 60 Left Knee Extension (Degrees): 0 Additional Comments: Pt agreeable to CPM even with pain level.   GP     Greggory Stallion 04/29/2013, 12:55 PM

## 2013-04-29 NOTE — Progress Notes (Addendum)
Subjective: 2 Days Post-Op Procedure(s) (LRB): Left TOTAL KNEE ARTHROPLASTY With Revision Tibial Component (Left) Venous Doppler negative. No cough or sputum production. No urinary complaints her symptoms. Activity level:  Weightbearing as tolerated Diet tolerance:  ok Voiding:  ok Patient reports pain as 5 on 0-10 scale.    Objective: Vital signs in last 24 hours: Temp:  [99.7 F (37.6 C)-101.9 F (38.8 C)] 101.4 F (38.6 C) (09/11 1210) Pulse Rate:  [91-105] 105 (09/10 2132) Resp:  [16-20] 16 (09/10 2132) BP: (112-165)/(67-74) 112/74 mmHg (09/10 2132) SpO2:  [96 %-97 %] 97 % (09/10 2132)  Labs:  Recent Labs  04/27/13 1855 04/28/13 0501 04/29/13 0614  HGB 13.2 12.2 11.6*    Recent Labs  04/28/13 0501 04/29/13 0614  WBC 6.7 7.7  RBC 4.21 4.03  HCT 36.6 35.0*  PLT 186 160    Recent Labs  04/27/13 1855 04/28/13 0501  NA  --  138  K  --  3.5  CL  --  101  CO2  --  28  BUN  --  7  CREATININE 0.90 0.86  GLUCOSE  --  114*  CALCIUM  --  8.7    Recent Labs  04/28/13 0501 04/29/13 0614  INR 1.07 1.63*    Physical Exam:  Neurologically intact ABD soft Neurovascular intact Sensation intact distally Intact pulses distally Dorsiflexion/Plantar flexion intact Incision: scant drainage No cellulitis present Compartment soft  Assessment/Plan:  2 Days Post-Op Procedure(s) (LRB): Left TOTAL KNEE ARTHROPLASTY With Revision Tibial Component (Left) Advance diet Up with therapy D/C IV fluids Plan for discharge tomorrow if relatively afebrile. Temperature taken now 99.5. Will get hospitalist consultation if temperature remained elevated tomorrow. At this time she has no respiratory or urinary tract symptoms. I have encouraged use of incentive spirometry and out of bed with therapy. Continue Coumadin/Lovenox for DVT prophylaxis. Will need Coumadin for 2 weeks postop Home health agency for home PT. Have stopped IV and encouraged participating in therapy this  afternoon.    Nathifa Ritthaler R 04/29/2013, 1:01 PM

## 2013-04-30 ENCOUNTER — Encounter (HOSPITAL_COMMUNITY): Payer: Self-pay | Admitting: Orthopaedic Surgery

## 2013-04-30 LAB — CBC
HCT: 34.9 % — ABNORMAL LOW (ref 36.0–46.0)
Platelets: 173 10*3/uL (ref 150–400)
RDW: 13 % (ref 11.5–15.5)
WBC: 6.9 10*3/uL (ref 4.0–10.5)

## 2013-04-30 LAB — PROTIME-INR
INR: 2.22 — ABNORMAL HIGH (ref 0.00–1.49)
Prothrombin Time: 23.9 seconds — ABNORMAL HIGH (ref 11.6–15.2)

## 2013-04-30 MED ORDER — OXYCODONE HCL 5 MG PO TABS: 5.0000 mg | ORAL_TABLET | Freq: Once | ORAL | Status: DC | PRN

## 2013-04-30 MED ORDER — PROMETHAZINE HCL 25 MG/ML IJ SOLN: 6.2500 mg | INTRAMUSCULAR | Status: DC | PRN

## 2013-04-30 MED ORDER — OXYCODONE HCL 5 MG/5ML PO SOLN: 5.0000 mg | Freq: Once | ORAL | Status: DC | PRN

## 2013-04-30 MED ORDER — HYDROMORPHONE HCL PF 1 MG/ML IJ SOLN: 0.2500 mg | INTRAMUSCULAR | Status: DC | PRN

## 2013-04-30 NOTE — Progress Notes (Signed)
Physical Therapy Treatment Patient Details Name: [name] MRN: [id number] DOB: 1950/12/07 Today's Date: 04/30/2013 Time: 4098-1191 PT Time Calculation (min): 24 min  PT Assessment / Plan / Recommendation  History of Present Illness Pt is a 62 y/o female admitted s/p L TKA on 04/27/13.   PT Comments   Pt. Making better strides with PT today, seems to have "turned the corner" in terms of her mobility and participation.  Still at  [facility] guard level.  Will attempt to see again this pm prior to anticipated DC.  Pt. Feels she is ready for Dc with the assist of daughter, older grandchildren and her aide.  Follow Up Recommendations  Home health PT;Supervision/Assistance - 24 hour;Supervision for mobility/OOB     Does the patient have the potential to tolerate intense rehabilitation     Barriers to Discharge        Equipment Recommendations  None recommended by PT    Recommendations for Other Services    Frequency 7X/week   Progress towards PT Goals Progress towards PT goals: Progressing toward goals  Plan Current plan remains appropriate    Precautions / Restrictions Precautions Precautions: Knee Restrictions Weight Bearing Restrictions: Yes LLE Weight Bearing: Weight bearing as tolerated   Pertinent Vitals/Pain  See vitals tab    Mobility  Bed Mobility Bed Mobility: Not assessed (pt. up in chair) Transfers Transfers: Sit to Stand;Stand to Sit Sit to Stand: 4: Min guard;From chair/3-in-1;With armrests Stand to Sit: 4: Min guard;To chair/3-in-1;With armrests Details for Transfer Assistance: hand placement cues Ambulation/Gait Ambulation/Gait Assistance: 4: Min guard Ambulation Distance (Feet): 70 Feet (35 x2) Assistive device: Rolling walker Ambulation/Gait Assistance Details: cues for technique, RW placement and step length Gait Pattern: Step-to pattern;Decreased stride length;Decreased dorsiflexion - left;Decreased weight shift to left;Antalgic Gait velocity:  decreased Stairs: No (pt. will enter via ramp)    Exercises Total Joint Exercises Ankle Circles/Pumps: AROM;Both;10 reps;Seated Quad Sets: AROM;Both;10 reps;Seated Short Arc Quad: AROM;Left;10 reps;Seated Long Arc Quad: AROM;Left;Seated;5 reps Knee Flexion: AROM;Left;5 reps;Seated Goniometric ROM: 0-70   PT Diagnosis:    PT Problem List:   PT Treatment Interventions:     PT Goals (current goals can now be found in the care plan section) Acute Rehab PT Goals Patient Stated Goal: return home  Visit Information  Last PT Received On: 04/30/13 Assistance Needed: +1 History of Present Illness: Pt is a 62 y/o female admitted s/p L TKA on 04/27/13.    Subjective Data  Subjective: Pt. reports feeling some better today Patient Stated Goal: return home   Cognition  Cognition Arousal/Alertness: Awake/alert Behavior During Therapy: WFL for tasks assessed/performed Overall Cognitive Status: Within Functional Limits for tasks assessed    Balance     End of Session PT - End of Session Equipment Utilized During Treatment: Gait belt Activity Tolerance: Patient tolerated treatment well Patient left: in chair;with call bell/phone within reach Nurse Communication: Mobility status   GP     Ferman Hamming 04/30/2013, 12:30 PM Weldon Picking PT Acute Rehab Services [phone] Beeper [phone]

## 2013-04-30 NOTE — Discharge Summary (Signed)
Patient ID: [name] MRN: [id number] DOB/AGE: [birth year] 62 y.o.  Admit date: 04/27/2013 Discharge date: 04/30/2013  Admission Diagnoses:  Principal Problem:   Left knee DJD   Discharge Diagnoses:  Same  Past Medical History  Diagnosis Date  . Complication of anesthesia     @ [facility].....COULDN'T GET HER AWAKE....FOR  HERNIA SURGERY... PLACED ON VENTILATOR  . CHF (congestive heart failure)     [name].......LAST OFFICE VISIT WAS A FEW AGO  . Bronchitis     USES NEB  AS NEEDED  . GERD (gastroesophageal reflux disease)     TAKES PRILOSEC BID  . 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 IBS (irritable bowel syndrome)   . Hypercholesteremia   . Anxiety     ANXIETY ONCE....PLACED ON CELEXA, controlling history of panic attacks   . Sleep difficulties     pt. states she had sleep eval > 10 yrs. ago in [place], no apnea found  . Arthritis     shoulder- R, L knee   . Hypertension     dr Allyson Sabal    Surgeries: Procedure(s): Left TOTAL KNEE ARTHROPLASTY With Revision Tibial Component on 04/27/2013   Consultants:    Discharged Condition: Improved  Hospital Course: [name] is an 62 y.o. female who was admitted 04/27/2013 for operative treatment ofLeft knee DJD. Patient has severe unremitting pain that affects sleep, daily activities, and work/hobbies. After pre-op clearance the patient was taken to the operating room on 04/27/2013 and underwent  Procedure(s): Left TOTAL KNEE ARTHROPLASTY With Revision Tibial Component.    Patient was given perioperative antibiotics: Anti-infectives   Start     Dose/Rate Route Frequency Ordered Stop   04/27/13 1930  ceFAZolin (ANCEF) IVPB 2 Dorsey/50 mL premix     2 Dorsey 100 mL/hr over 30 Minutes Intravenous Every 6 hours 04/27/13 1809 04/28/13 0334   04/27/13 0600  ceFAZolin (ANCEF) 3 Dorsey in dextrose 5 % 50 mL IVPB     3 Dorsey 160 mL/hr over 30 Minutes Intravenous On call to O.R. 04/26/13 1427 04/27/13 1045       Patient was  given sequential compression devices, early ambulation, and chemoprophylaxis to prevent DVT.  Patient benefited maximally from hospital stay and there were no complications.    Recent vital signs: Patient Vitals for the past 24 hrs:  BP Temp Temp src Pulse Resp SpO2 Height Weight  04/30/13 0516 121/63 mmHg 98.5 F (36.9 C) - 92 18 92 % - -  04/30/13 0400 - - - - 18 95 % - -  04/30/13 0000 - - - - 18 - 5\' 3"  (1.6 m) 121.564 kg (268 lb)  04/29/13 2137 107/50 mmHg 99.5 F (37.5 C) - 94 16 97 % - -  04/29/13 2000 - - - - 18 - - -  04/29/13 1340 137/54 mmHg 99.6 F (37.6 C) - 98 16 100 % - -  04/29/13 1210 - 101.4 F (38.6 C) Axillary - - - - -  04/29/13 1108 - 101.9 F (38.8 C) Axillary - - - - -     Recent laboratory studies:  Recent Labs  04/27/13 1855  04/28/13 0501 04/29/13 0614 04/30/13 0500  WBC 7.9  --  6.7 7.7 6.9  HGB 13.2  --  12.2 11.6* 11.6*  HCT 39.1  --  36.6 35.0* 34.9*  PLT 204  --  186 160 173  NA  --   --  138  --   --  K  --   --  3.5  --   --   CL  --   --  101  --   --   CO2  --   --  28  --   --   BUN  --   --  7  --   --   CREATININE 0.90  --  0.86  --   --   GLUCOSE  --   --  114*  --   --   INR  --   < > 1.07 1.63* 2.22*  CALCIUM  --   --  8.7  --   --   < > = values in this interval not displayed.   Discharge Medications:     Medication List    STOP taking these medications       HYDROcodone-acetaminophen 5-325 MG per tablet  Commonly known as:  NORCO/VICODIN  Replaced by:  HYDROcodone-acetaminophen 7.5-325 MG per tablet     meloxicam 7.5 MG tablet  Commonly known as:  MOBIC     oxyCODONE-acetaminophen 5-325 MG per tablet  Commonly known as:  PERCOCET/ROXICET      TAKE these medications       albuterol 108 (90 BASE) MCG/ACT inhaler  Commonly known as:  PROVENTIL HFA;VENTOLIN HFA  Inhale 2 puffs into the lungs every 6 (six) hours as needed for wheezing.     albuterol (2.5 MG/3ML) 0.083% nebulizer solution  Commonly known as:   PROVENTIL  Take 2.5 mg by nebulization every 6 (six) hours as needed for wheezing.     citalopram 20 MG tablet  Commonly known as:  CELEXA  Take 20 mg by mouth daily.     furosemide 80 MG tablet  Commonly known as:  LASIX  Take 80 mg by mouth daily.     HYDROcodone-acetaminophen 7.5-325 MG per tablet  Commonly known as:  NORCO  Take 1-2 tablets by mouth every 4 (four) hours as needed.     losartan-hydrochlorothiazide 100-12.5 MG per tablet  Commonly known as:  HYZAAR  Take 1 tablet by mouth daily.     methocarbamol 500 MG tablet  Commonly known as:  ROBAXIN  Take 1 tablet (500 mg total) by mouth every 6 (six) hours as needed.     omeprazole 20 MG capsule  Commonly known as:  PRILOSEC  Take 20 mg by mouth 2 (two) times daily.     potassium chloride SA 20 MEQ tablet  Commonly known as:  K-DUR,KLOR-CON  Take 20 mEq by mouth 2 (two) times daily.     simvastatin 20 MG tablet  Commonly known as:  ZOCOR  Take 20 mg by mouth every evening.     Vitamin D (Ergocalciferol) 50000 UNITS Caps capsule  Commonly known as:  DRISDOL  Take 50,000 Units by mouth every 7 (seven) days. On Wednesday     warfarin 5 MG tablet  Commonly known as:  COUMADIN  Dose per pharmacy protocol. Will be on Coumadin x2 weeks.       will be on Coumadin protocol x2 weeks with INR between 1.8 and-3.0.  Diagnostic Studies: No results found.  Disposition: 01-Home or Self Care      Discharge Orders   Future Orders Complete By Expires   Call MD / Call 911  As directed    Comments:     If you experience chest pain or shortness of breath, CALL 911 and be transported to the hospital emergency room.  If you develope  a fever above 101 F, pus (white drainage) or increased drainage or redness at the wound, or calf pain, call your surgeon's office.   Constipation Prevention  As directed    Comments:     Drink plenty of fluids.  Prune juice may be helpful.  You may use a stool softener, such as Colace (over the  counter) 100 mg twice a day.  Use MiraLax (over the counter) for constipation as needed.   Diet - low sodium heart healthy  As directed    Increase activity slowly as tolerated  As directed       Follow-up Information   Follow up with Candace G, MD. Call in 2 weeks.   Specialty:  Orthopedic Surgery   Contact information:   [address] [place] [address] [phone]        Signed: Prince Rome 04/30/2013, 8:06 AM

## 2013-04-30 NOTE — Progress Notes (Addendum)
Subjective: 3 Days Post-Op Procedure(s) (LRB): Left TOTAL KNEE ARTHROPLASTY With Revision Tibial Component (Left) Last temperature 98.5. Advancing well with therapy. INR therapeutic. Activity level:  Weightbearing as tolerated Diet tolerance:  ok Voiding:  ok Patient reports pain as 3 on 0-10 scale.    Objective: Vital signs in last 24 hours: Temp:  [98.5 F (36.9 C)-101.9 F (38.8 C)] 98.5 F (36.9 C) (09/12 0516) Pulse Rate:  [92-98] 92 (09/12 0516) Resp:  [16-18] 18 (09/12 0516) BP: (107-137)/(50-63) 121/63 mmHg (09/12 0516) SpO2:  [92 %-100 %] 92 % (09/12 0516) Weight:  [121.564 kg (268 lb)] 121.564 kg (268 lb) (09/12 0000)  Labs:  Recent Labs  04/27/13 1855 04/28/13 0501 04/29/13 0614 04/30/13 0500  HGB 13.2 12.2 11.6* 11.6*    Recent Labs  04/29/13 0614 04/30/13 0500  WBC 7.7 6.9  RBC 4.03 4.02  HCT 35.0* 34.9*  PLT 160 173    Recent Labs  04/27/13 1855 04/28/13 0501  NA  --  138  K  --  3.5  CL  --  101  CO2  --  28  BUN  --  7  CREATININE 0.90 0.86  GLUCOSE  --  114*  CALCIUM  --  8.7    Recent Labs  04/29/13 0614 04/30/13 0500  INR 1.63* 2.22*    Physical Exam:  Neurologically intact ABD soft Neurovascular intact Sensation intact distally Intact pulses distally Dorsiflexion/Plantar flexion intact No cellulitis present Compartment soft  Assessment/Plan:  3 Days Post-Op Procedure(s) (LRB): Left TOTAL KNEE ARTHROPLASTY With Revision Tibial Component (Left) Advance diet Up with therapy Discharge home with home health patient will need home therapy and blood draws for Coumadin protocol. Be on Coumadin for 2 weeks total. Plan discharge today after therapy. Prescriptions are in the chart. Return to office in 2 weeks. Patient is currently afebrile without cough or sputum production or urinary symptoms.    [name] R 04/30/2013, 8:02 AM

## 2013-05-04 ENCOUNTER — Other Ambulatory Visit (HOSPITAL_COMMUNITY): Payer: Self-pay | Admitting: Orthopedic Surgery

## 2013-05-04 DIAGNOSIS — M7989 Other specified soft tissue disorders: Secondary | ICD-10-CM

## 2013-05-04 DIAGNOSIS — M25562 Pain in left knee: Secondary | ICD-10-CM

## 2013-05-04 NOTE — Progress Notes (Signed)
Late Entry: SW received a consult for possible placement. PT  At this time is recommending home with HH and not SNF. . Clinical Social Worker will sign off for now as social work intervention is no longer needed. Please consult us again if new need arises.   Tonetta Napoles,LCSWA,  MSW [phone] 

## 2013-05-05 ENCOUNTER — Ambulatory Visit (HOSPITAL_COMMUNITY)
Admission: RE | Admit: 2013-05-05 | Discharge: 2013-05-05 | Disposition: A | Discharge status: Home / Self Care | Payer: Medicaid Other | Source: Ambulatory Visit | Attending: Orthopedic Surgery | Admitting: Orthopedic Surgery

## 2013-05-05 DIAGNOSIS — M7989 Other specified soft tissue disorders: Secondary | ICD-10-CM

## 2013-05-05 DIAGNOSIS — Z96659 Presence of unspecified artificial knee joint: Secondary | ICD-10-CM | POA: Insufficient documentation

## 2013-05-05 DIAGNOSIS — M79609 Pain in unspecified limb: Secondary | ICD-10-CM | POA: Insufficient documentation

## 2013-05-05 DIAGNOSIS — M25562 Pain in left knee: Secondary | ICD-10-CM

## 2013-05-05 NOTE — Progress Notes (Signed)
VASCULAR LAB PRELIMINARY  PRELIMINARY  PRELIMINARY  PRELIMINARY  Left lower extremity venous duplex completed.    Preliminary report:  Left:  No evidence of DVT, superficial thrombosis, or Baker's cyst.  Guiliana Shor, RVT 05/05/2013, 10:49 AM

## 2013-05-21 ENCOUNTER — Telehealth: Payer: Self-pay | Admitting: *Deleted

## 2013-05-21 NOTE — Telephone Encounter (Signed)
Fax received asking if Dr Allyson Sabal would order some supplies for [name] (weight scale and bp monitor).  Patient hasn't been seen since 2009.  Pt will need an office visit before we can order anything.  I left Caro Hight a message concerning [name].

## 2013-05-26 ENCOUNTER — Ambulatory Visit: Payer: Medicaid Other | Admitting: Physical Therapy

## 2013-05-26 ENCOUNTER — Other Ambulatory Visit: Payer: Self-pay

## 2013-05-26 DIAGNOSIS — Z1231 Encounter for screening mammogram for malignant neoplasm of breast: Secondary | ICD-10-CM

## 2013-06-04 ENCOUNTER — Ambulatory Visit
Admission: RE | Admit: 2013-06-04 | Discharge: 2013-06-04 | Disposition: A | Discharge status: Home / Self Care | Payer: Medicaid Other | Source: Ambulatory Visit

## 2013-06-04 DIAGNOSIS — Z1231 Encounter for screening mammogram for malignant neoplasm of breast: Secondary | ICD-10-CM

## 2013-12-24 ENCOUNTER — Encounter: Payer: Self-pay | Admitting: Advanced Practice Midwife

## 2014-01-07 ENCOUNTER — Ambulatory Visit: Payer: Medicaid Other | Admitting: Advanced Practice Midwife

## 2014-06-20 DIAGNOSIS — Z23 Encounter for immunization: Secondary | ICD-10-CM | POA: Diagnosis not present

## 2014-06-20 DIAGNOSIS — F419 Anxiety disorder, unspecified: Secondary | ICD-10-CM | POA: Diagnosis not present

## 2014-06-20 DIAGNOSIS — Z131 Encounter for screening for diabetes mellitus: Secondary | ICD-10-CM | POA: Diagnosis not present

## 2014-06-20 DIAGNOSIS — E784 Other hyperlipidemia: Secondary | ICD-10-CM | POA: Diagnosis not present

## 2014-06-20 DIAGNOSIS — G894 Chronic pain syndrome: Secondary | ICD-10-CM | POA: Diagnosis not present

## 2014-06-20 DIAGNOSIS — J42 Unspecified chronic bronchitis: Secondary | ICD-10-CM | POA: Diagnosis not present

## 2014-06-20 DIAGNOSIS — I10 Essential (primary) hypertension: Secondary | ICD-10-CM | POA: Diagnosis not present

## 2014-07-25 DIAGNOSIS — I10 Essential (primary) hypertension: Secondary | ICD-10-CM | POA: Diagnosis not present

## 2014-07-25 DIAGNOSIS — G894 Chronic pain syndrome: Secondary | ICD-10-CM | POA: Diagnosis not present

## 2014-07-25 DIAGNOSIS — Z23 Encounter for immunization: Secondary | ICD-10-CM | POA: Diagnosis not present

## 2014-07-25 DIAGNOSIS — Z1389 Encounter for screening for other disorder: Secondary | ICD-10-CM | POA: Diagnosis not present

## 2014-07-25 DIAGNOSIS — K219 Gastro-esophageal reflux disease without esophagitis: Secondary | ICD-10-CM | POA: Diagnosis not present

## 2014-07-25 DIAGNOSIS — F419 Anxiety disorder, unspecified: Secondary | ICD-10-CM | POA: Diagnosis not present

## 2014-07-25 DIAGNOSIS — E784 Other hyperlipidemia: Secondary | ICD-10-CM | POA: Diagnosis not present

## 2014-07-26 ENCOUNTER — Other Ambulatory Visit: Payer: Self-pay

## 2014-07-26 DIAGNOSIS — Z1231 Encounter for screening mammogram for malignant neoplasm of breast: Secondary | ICD-10-CM

## 2014-08-09 ENCOUNTER — Ambulatory Visit: Payer: Medicaid Other

## 2014-08-26 ENCOUNTER — Ambulatory Visit
Admission: RE | Admit: 2014-08-26 | Discharge: 2014-08-26 | Disposition: A | Discharge status: Home / Self Care | Payer: Medicaid Other | Source: Ambulatory Visit

## 2014-08-26 DIAGNOSIS — Z1231 Encounter for screening mammogram for malignant neoplasm of breast: Secondary | ICD-10-CM

## 2014-09-22 DIAGNOSIS — I10 Essential (primary) hypertension: Secondary | ICD-10-CM | POA: Diagnosis not present

## 2014-09-22 DIAGNOSIS — B0229 Other postherpetic nervous system involvement: Secondary | ICD-10-CM | POA: Diagnosis not present

## 2014-09-22 DIAGNOSIS — J42 Unspecified chronic bronchitis: Secondary | ICD-10-CM | POA: Diagnosis not present

## 2014-09-22 DIAGNOSIS — G894 Chronic pain syndrome: Secondary | ICD-10-CM | POA: Diagnosis not present

## 2014-09-22 DIAGNOSIS — F419 Anxiety disorder, unspecified: Secondary | ICD-10-CM | POA: Diagnosis not present

## 2014-10-04 DIAGNOSIS — H209 Unspecified iridocyclitis: Secondary | ICD-10-CM | POA: Diagnosis not present

## 2014-12-25 ENCOUNTER — Emergency Department (HOSPITAL_COMMUNITY)
Admission: EM | Admit: 2014-12-25 | Discharge: 2014-12-25 | Disposition: A | Discharge status: Home / Self Care | Payer: Medicare Other | Attending: Emergency Medicine | Admitting: Emergency Medicine

## 2014-12-25 ENCOUNTER — Encounter (HOSPITAL_COMMUNITY): Payer: Self-pay | Admitting: *Deleted

## 2014-12-25 DIAGNOSIS — Z8669 Personal history of other diseases of the nervous system and sense organs: Secondary | ICD-10-CM | POA: Insufficient documentation

## 2014-12-25 DIAGNOSIS — E780 Pure hypercholesterolemia: Secondary | ICD-10-CM | POA: Diagnosis not present

## 2014-12-25 DIAGNOSIS — I509 Heart failure, unspecified: Secondary | ICD-10-CM | POA: Diagnosis not present

## 2014-12-25 DIAGNOSIS — Z79899 Other long term (current) drug therapy: Secondary | ICD-10-CM | POA: Insufficient documentation

## 2014-12-25 DIAGNOSIS — M1990 Unspecified osteoarthritis, unspecified site: Secondary | ICD-10-CM | POA: Insufficient documentation

## 2014-12-25 DIAGNOSIS — K219 Gastro-esophageal reflux disease without esophagitis: Secondary | ICD-10-CM | POA: Insufficient documentation

## 2014-12-25 DIAGNOSIS — M545 Low back pain: Secondary | ICD-10-CM | POA: Diagnosis present

## 2014-12-25 DIAGNOSIS — F419 Anxiety disorder, unspecified: Secondary | ICD-10-CM | POA: Diagnosis not present

## 2014-12-25 DIAGNOSIS — Z8709 Personal history of other diseases of the respiratory system: Secondary | ICD-10-CM | POA: Insufficient documentation

## 2014-12-25 DIAGNOSIS — I10 Essential (primary) hypertension: Secondary | ICD-10-CM | POA: Insufficient documentation

## 2014-12-25 DIAGNOSIS — Z8619 Personal history of other infectious and parasitic diseases: Secondary | ICD-10-CM | POA: Insufficient documentation

## 2014-12-25 DIAGNOSIS — M5442 Lumbago with sciatica, left side: Secondary | ICD-10-CM | POA: Insufficient documentation

## 2014-12-25 DIAGNOSIS — M5432 Sciatica, left side: Secondary | ICD-10-CM

## 2014-12-25 MED ORDER — PREDNISONE 20 MG PO TABS
60.0000 mg | ORAL_TABLET | Freq: Once | ORAL | Status: AC
  Administered 2014-12-25: 60 mg via ORAL
  Filled 2014-12-25: qty 3

## 2014-12-25 MED ORDER — PREDNISONE 20 MG PO TABS: 40.0000 mg | ORAL_TABLET | Freq: Every day | ORAL | Status: DC
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
## 2014-12-25 NOTE — ED Notes (Signed)
The pt is c/o lower back pain for 3  Weeks.  She has chronic back pain.  She had to leave the restaurant due to the pain

## 2014-12-25 NOTE — Discharge Instructions (Signed)

## 2014-12-25 NOTE — ED Notes (Signed)
Will d/c pt when registration is complete.

## 2015-01-02 DIAGNOSIS — M5430 Sciatica, unspecified side: Secondary | ICD-10-CM | POA: Diagnosis not present

## 2015-01-02 DIAGNOSIS — E784 Other hyperlipidemia: Secondary | ICD-10-CM | POA: Diagnosis not present

## 2015-01-02 DIAGNOSIS — J42 Unspecified chronic bronchitis: Secondary | ICD-10-CM | POA: Diagnosis not present

## 2015-01-02 DIAGNOSIS — K580 Irritable bowel syndrome with diarrhea: Secondary | ICD-10-CM | POA: Diagnosis not present

## 2015-01-02 DIAGNOSIS — I10 Essential (primary) hypertension: Secondary | ICD-10-CM | POA: Diagnosis not present

## 2015-01-04 DIAGNOSIS — H26492 Other secondary cataract, left eye: Secondary | ICD-10-CM | POA: Diagnosis not present

## 2015-01-11 DIAGNOSIS — M5440 Lumbago with sciatica, unspecified side: Secondary | ICD-10-CM | POA: Diagnosis not present

## 2015-01-26 ENCOUNTER — Other Ambulatory Visit: Payer: Self-pay | Admitting: Orthopaedic Surgery

## 2015-01-26 DIAGNOSIS — M545 Low back pain: Secondary | ICD-10-CM

## 2015-02-05 ENCOUNTER — Other Ambulatory Visit: Payer: Medicare Other

## 2015-02-06 ENCOUNTER — Ambulatory Visit
Admission: RE | Admit: 2015-02-06 | Discharge: 2015-02-06 | Disposition: A | Discharge status: Home / Self Care | Payer: Medicare Other | Source: Ambulatory Visit | Attending: Orthopaedic Surgery | Admitting: Orthopaedic Surgery

## 2015-02-06 DIAGNOSIS — M4316 Spondylolisthesis, lumbar region: Secondary | ICD-10-CM | POA: Diagnosis not present

## 2015-02-06 DIAGNOSIS — M47817 Spondylosis without myelopathy or radiculopathy, lumbosacral region: Secondary | ICD-10-CM | POA: Diagnosis not present

## 2015-02-06 DIAGNOSIS — M545 Low back pain: Secondary | ICD-10-CM

## 2015-02-06 DIAGNOSIS — M5137 Other intervertebral disc degeneration, lumbosacral region: Secondary | ICD-10-CM | POA: Diagnosis not present

## 2015-02-08 ENCOUNTER — Other Ambulatory Visit: Payer: Medicare Other

## 2015-02-13 DIAGNOSIS — M545 Low back pain: Secondary | ICD-10-CM | POA: Diagnosis not present

## 2015-02-22 DIAGNOSIS — N2889 Other specified disorders of kidney and ureter: Secondary | ICD-10-CM | POA: Diagnosis not present

## 2015-02-27 DIAGNOSIS — H2511 Age-related nuclear cataract, right eye: Secondary | ICD-10-CM | POA: Diagnosis not present

## 2015-03-06 ENCOUNTER — Encounter: Payer: Self-pay | Admitting: Infectious Disease

## 2015-03-06 ENCOUNTER — Ambulatory Visit (INDEPENDENT_AMBULATORY_CARE_PROVIDER_SITE_OTHER): Payer: Medicare Other | Admitting: Infectious Disease

## 2015-03-06 VITALS — BP 140/110 | Temp 98.2°F | Wt 269.0 lb

## 2015-03-06 DIAGNOSIS — I5020 Unspecified systolic (congestive) heart failure: Secondary | ICD-10-CM | POA: Diagnosis not present

## 2015-03-06 DIAGNOSIS — M4647 Discitis, unspecified, lumbosacral region: Secondary | ICD-10-CM | POA: Diagnosis not present

## 2015-03-06 DIAGNOSIS — D497 Neoplasm of unspecified behavior of endocrine glands and other parts of nervous system: Secondary | ICD-10-CM

## 2015-03-06 DIAGNOSIS — B029 Zoster without complications: Secondary | ICD-10-CM

## 2015-03-06 DIAGNOSIS — I509 Heart failure, unspecified: Secondary | ICD-10-CM | POA: Insufficient documentation

## 2015-03-06 DIAGNOSIS — K589 Irritable bowel syndrome without diarrhea: Secondary | ICD-10-CM | POA: Diagnosis not present

## 2015-03-06 DIAGNOSIS — Z113 Encounter for screening for infections with a predominantly sexual mode of transmission: Secondary | ICD-10-CM | POA: Diagnosis not present

## 2015-03-06 DIAGNOSIS — R1084 Generalized abdominal pain: Secondary | ICD-10-CM

## 2015-03-06 DIAGNOSIS — R197 Diarrhea, unspecified: Secondary | ICD-10-CM | POA: Diagnosis not present

## 2015-03-06 DIAGNOSIS — B009 Herpesviral infection, unspecified: Secondary | ICD-10-CM

## 2015-03-06 DIAGNOSIS — M4646 Discitis, unspecified, lumbar region: Secondary | ICD-10-CM

## 2015-03-06 DIAGNOSIS — D434 Neoplasm of uncertain behavior of spinal cord: Secondary | ICD-10-CM

## 2015-03-06 HISTORY — DX: Heart failure, unspecified: I50.9

## 2015-03-06 HISTORY — DX: Herpesviral infection, unspecified: B00.9

## 2015-03-06 HISTORY — DX: Zoster without complications: B02.9

## 2015-03-06 HISTORY — DX: Neoplasm of unspecified behavior of endocrine glands and other parts of nervous system: D49.7

## 2015-03-06 HISTORY — DX: Discitis, unspecified, lumbosacral region: M46.47

## 2015-03-06 HISTORY — DX: Irritable bowel syndrome without diarrhea: K58.9

## 2015-03-06 NOTE — Progress Notes (Signed)
Subjective:    Patient ID: [name], female    DOB: Dec 24, 1950, 64 y.o.   MRN: [id number]  HPI Mrs. [name] is a 64 y/o female with PMH of heart failure, IBD with diarrhea, HTN, removal of spinal tumor, with recent episode of shingles on left buttock in March. She developed back pain that radiated from lower back and into groin. She states her activity level is extremely pain limited. Pain has progressively worsened since the Shingles infection and rates her current pain as 7/10. She has been taking percocet for pain. Her PCP referred her to [place]. An MRI of the spine on 02/06/15 shows concern for diskitis and osteomyelitis in the L4-L5 region. She was also noted to have symptomatic spinal stenosis at L4 - L5 and L2 to L3 and she was referred to ID for further management. She denies fevers, but reports breaking out in sweats. Her appetite had bene poor in April but improved back to baseline. She feels weak and out of breath. She is having diarrhea 10-15 times day, has not been on antibiotics or taking laxatives.     Outpatient Encounter Prescriptions as of 03/06/2015  Medication Sig  . albuterol (PROVENTIL HFA;VENTOLIN HFA) 108 (90 BASE) MCG/ACT inhaler Inhale 2 puffs into the lungs every 6 (six) hours as needed for wheezing.  Marland Kitchen albuterol (PROVENTIL) (2.5 MG/3ML) 0.083% nebulizer solution Take 2.5 mg by nebulization every 6 (six) hours as needed for wheezing.  . citalopram (CELEXA) 20 MG tablet Take 20 mg by mouth daily.  . furosemide (LASIX) 80 MG tablet Take 80 mg by mouth daily.   Marland Kitchen HYDROcodone-acetaminophen (NORCO) 7.5-325 MG per tablet Take 1-2 tablets by mouth every 4 (four) hours as needed.  Marland Kitchen losartan-hydrochlorothiazide (HYZAAR) 100-12.5 MG per tablet Take 1 tablet by mouth daily.  . methocarbamol (ROBAXIN) 500 MG tablet Take 1 tablet (500 mg total) by mouth every 6 (six) hours as needed.  Marland Kitchen omeprazole (PRILOSEC) 20 MG capsule Take 20 mg by mouth 2 (two) times daily.  .  potassium chloride SA (K-DUR,KLOR-CON) 20 MEQ tablet Take 20 mEq by mouth 2 (two) times daily.  . simvastatin (ZOCOR) 20 MG tablet Take 20 mg by mouth every evening.  . Vitamin D, Ergocalciferol, (DRISDOL) 50000 UNITS CAPS Take 50,000 Units by mouth every 7 (seven) days. On Wednesday  . predniSONE (DELTASONE) 20 MG tablet Take 2 tablets (40 mg total) by mouth daily. (Patient not taking: Reported on 03/06/2015)  . warfarin (COUMADIN) 5 MG tablet Dose per pharmacy protocol. Will be on Coumadin x2 weeks. (Patient not taking: Reported on 03/06/2015)   No facility-administered encounter medications on file as of 03/06/2015.      Review of Systems  Constitutional: Positive for diaphoresis, activity change and fatigue. Negative for fever, chills, appetite change and unexpected weight change.  HENT: Negative.   Eyes: Negative for photophobia and visual disturbance.  Respiratory: Negative for cough, chest tightness and shortness of breath.   Cardiovascular: Positive for leg swelling. Negative for chest pain.  Gastrointestinal: Negative for nausea, vomiting, abdominal pain, constipation, blood in stool and abdominal distention.  Genitourinary: Negative for difficulty urinating.  Musculoskeletal: Positive for back pain. Negative for myalgias and arthralgias.  Skin: Negative for rash.  Neurological: Negative for weakness, numbness and headaches.  Hematological: Negative for adenopathy.  Psychiatric/Behavioral: Negative for sleep disturbance. The patient is not nervous/anxious.        Objective:   Physical Exam  Constitutional: She is oriented to person, place, and time.  She appears well-developed and well-nourished.  HENT:  Head: Normocephalic and atraumatic.  Mouth/Throat: No oropharyngeal exudate.  Eyes: Conjunctivae are normal. Pupils are equal, round, and reactive to light. No scleral icterus.  Neck: Normal range of motion. Neck supple.  Cardiovascular: Normal rate and regular rhythm.     Pulmonary/Chest: Effort normal and breath sounds normal.  Abdominal: Soft.  Hyperactive bowel sounds  Musculoskeletal:       Lumbar back: She exhibits decreased range of motion, tenderness, bony tenderness and pain.  Lymphadenopathy:    She has no cervical adenopathy.  Neurological: She is alert and oriented to person, place, and time.  Motor strength assessment limited by pain. L>R.  Skin: Skin is warm and dry.  Psychiatric: She has a normal mood and affect. Her behavior is normal. Judgment and thought content normal.    Blood pressure 140/110, temperature 98.2 F (36.8 C), temperature source Oral, weight 269 lb (122.018 kg).       Assessment & Plan:   Diskitis of L4- L5: MRI of lumbar spine from June consistent with diskitis and possible osteomyelitis. Constantly in pain, back is very tender to palpation. She has not had any fevers but has been diaphoretic. She has not yet been treated with antibiotics. - Will check CRP, Sed rate, CBC, CMP, HIV and Hepatitis Panel - Will order CT guided disc aspiration for culture - Discussed possibility of infection and the treatment of spinal infections. She was slightly tearful about the condition of her health but was in agreement with plan to proceed with disc aspiration.     Diarrhea: Having 10-15 bowel movements a day. Does have a history of IBD with diarrhea. However, she states that is very different from her baseline and she has been on steroids. Was running to the bathroom multiple times throughout her office visit.  - Obtained spec for C. Diff by PCR - Will check kidney function and electrolytes.   Heart failure:  She states her heart failure has been under good control and weighs herself daily. She takes Lasix and denies any previous cardiorenal syndromes.   INFECTIOUS DISEASE ATTENDING ADDENDUM:     [place] [facility]   Date: 03/06/2015  Patient name: SHANETTA NICOLLS  Medical record number:  [id number]  Date of birth: 03-09-1951    This patient has been seen and discussed with the NP. Please see her note for complete details. I concur with their findings with the following additions/corrections in my own separate note from today.  Alcide Evener 03/06/2015, 10:03 PM

## 2015-03-06 NOTE — Progress Notes (Signed)
Patient ID: [name], female   DOB: September 06, 1950, 64 y.o.   MRN: [id number]  INFECTIOUS DISEASE ATTENDING ADDENDUM:     [place] [facility] Infectious [facility]   Date: 03/06/2015  Patient name: [name]  Medical record number: [id number]  Date of birth: Mar 04, 1951    This patient has been seen and discussed with the NP Davina Poke. Please see her note for complete details. I concur with their findings with the following additions/corrections:  Briefly this is a  64 year old lady with PMHX significant for CHF, IBS, OA, spinal tumor sp resection who had what she thought was zoster outbreak that was followed by persistent pain in lower back with radiation into groin. She was seen by Dr Rhona Raider and had been given a course of prednisone with no improvement in symptoms. She ultimately had MRI of the spine which showed evidence of lumbar diskitis and osteomyelitis on June 30th. She has YET to have aspirate of the area for culture and has not been on antibiotics which is fortunate for diagnostic purposes.  On exam she had signficant pain and discomfort moving up and back off of the exam table and pain esp Left straight leg raise and flexion.  She tells me that she actually has pain "all the time" which is very much consistent with infection.  She also has 12 p ROS signficant for diarrhea with 15+ BM during the day including 3-4 during her visit with [name]  #1 Diskitis: --we will arrange for IR guided aspirate for bacterial, AFB and fungal cultures x 1 --then if bacterial cultures do not give [name] yield will consider aspirate x 2 for a 2nd attempt at cultures --she will likely need 6-8 weeks of parenteral therapy  #2 Diarrhea:I worry with her PPI use and steroid exposure that this could be CDI and we will check C diff PCR  #3 Screening: will check for HIV and viral hepatides  I spent greater than 45 minutes with the patient including greater than 50% of time in face to face counsel of the  patient re her diskitis, diarrhea and necessary workup  and in coordination of their care with IR   Rhina Brackett Dam 03/06/2015, 7:26 PM

## 2015-03-07 ENCOUNTER — Other Ambulatory Visit: Payer: Medicare Other

## 2015-03-07 DIAGNOSIS — M4646 Discitis, unspecified, lumbar region: Secondary | ICD-10-CM | POA: Diagnosis not present

## 2015-03-07 DIAGNOSIS — Z113 Encounter for screening for infections with a predominantly sexual mode of transmission: Secondary | ICD-10-CM

## 2015-03-07 DIAGNOSIS — B009 Herpesviral infection, unspecified: Secondary | ICD-10-CM | POA: Diagnosis not present

## 2015-03-07 LAB — CBC WITH DIFFERENTIAL/PLATELET
Basophils Absolute: 0 10*3/uL (ref 0.0–0.1)
Basophils Relative: 1 % (ref 0–1)
EOS PCT: 2 % (ref 0–5)
Eosinophils Absolute: 0.1 10*3/uL (ref 0.0–0.7)
HCT: 42.7 % (ref 36.0–46.0)
Hemoglobin: 14.1 g/dL (ref 12.0–15.0)
LYMPHS ABS: 1.6 10*3/uL (ref 0.7–4.0)
LYMPHS PCT: 42 % (ref 12–46)
MCH: 28.1 pg (ref 26.0–34.0)
MCHC: 33 g/dL (ref 30.0–36.0)
MCV: 85.2 fL (ref 78.0–100.0)
MPV: 10.2 fL (ref 8.6–12.4)
Monocytes Absolute: 0.5 10*3/uL (ref 0.1–1.0)
Monocytes Relative: 12 % (ref 3–12)
Neutro Abs: 1.6 10*3/uL — ABNORMAL LOW (ref 1.7–7.7)
Neutrophils Relative %: 43 % (ref 43–77)
PLATELETS: 204 10*3/uL (ref 150–400)
RBC: 5.01 MIL/uL (ref 3.87–5.11)
RDW: 14.4 % (ref 11.5–15.5)
WBC: 3.8 10*3/uL — AB (ref 4.0–10.5)

## 2015-03-07 LAB — COMPLETE METABOLIC PANEL WITH GFR
ALT: 10 U/L (ref 0–35)
AST: 13 U/L (ref 0–37)
Albumin: 3.8 g/dL (ref 3.5–5.2)
Alkaline Phosphatase: 60 U/L (ref 39–117)
BILIRUBIN TOTAL: 0.4 mg/dL (ref 0.2–1.2)
BUN: 17 mg/dL (ref 6–23)
CO2: 24 meq/L (ref 19–32)
Calcium: 9.2 mg/dL (ref 8.4–10.5)
Chloride: 103 mEq/L (ref 96–112)
Creat: 1.25 mg/dL — ABNORMAL HIGH (ref 0.50–1.10)
GFR, Est African American: 52 mL/min — ABNORMAL LOW
GFR, Est Non African American: 45 mL/min — ABNORMAL LOW
GLUCOSE: 95 mg/dL (ref 70–99)
Potassium: 3.8 mEq/L (ref 3.5–5.3)
Sodium: 140 mEq/L (ref 135–145)
TOTAL PROTEIN: 7 g/dL (ref 6.0–8.3)

## 2015-03-07 LAB — C-REACTIVE PROTEIN: CRP: 1.6 mg/dL — AB (ref <0.60)

## 2015-03-07 LAB — CLOSTRIDIUM DIFFICILE BY PCR: Toxigenic C. Difficile by PCR: NOT DETECTED

## 2015-03-07 NOTE — Addendum Note (Signed)
Addended by: Jarrett Ables D on: 03/07/2015 01:59 PM   Modules accepted: Orders

## 2015-03-08 ENCOUNTER — Other Ambulatory Visit: Payer: Self-pay | Admitting: Infectious Disease

## 2015-03-08 DIAGNOSIS — M4645 Discitis, unspecified, thoracolumbar region: Secondary | ICD-10-CM

## 2015-03-08 LAB — SEDIMENTATION RATE: Sed Rate: 27 mm/hr (ref 0–30)

## 2015-03-08 LAB — HIV ANTIBODY (ROUTINE TESTING W REFLEX): HIV: NONREACTIVE

## 2015-03-09 ENCOUNTER — Ambulatory Visit (HOSPITAL_COMMUNITY)
Admission: RE | Admit: 2015-03-09 | Discharge: 2015-03-09 | Disposition: A | Discharge status: Home / Self Care | Payer: Medicare Other | Source: Ambulatory Visit | Attending: Infectious Disease | Admitting: Infectious Disease

## 2015-03-09 DIAGNOSIS — M47896 Other spondylosis, lumbar region: Secondary | ICD-10-CM | POA: Insufficient documentation

## 2015-03-09 DIAGNOSIS — M47897 Other spondylosis, lumbosacral region: Secondary | ICD-10-CM | POA: Diagnosis not present

## 2015-03-09 DIAGNOSIS — M4645 Discitis, unspecified, thoracolumbar region: Secondary | ICD-10-CM

## 2015-03-09 DIAGNOSIS — M5136 Other intervertebral disc degeneration, lumbar region: Secondary | ICD-10-CM | POA: Diagnosis not present

## 2015-03-10 ENCOUNTER — Telehealth: Payer: Self-pay | Admitting: *Deleted

## 2015-03-10 NOTE — Telephone Encounter (Signed)
Patient called for xray results, please advise.

## 2015-03-11 NOTE — Telephone Encounter (Signed)
XRAYS LOOK fine. I think IR need to proceed with biopsy as requested

## 2015-03-13 NOTE — Telephone Encounter (Signed)
Patient notified and [name] is scheduling her for the biopsy. [name]

## 2015-03-13 NOTE — Telephone Encounter (Signed)
Left a message with Elmo Putt in radiology to schedule this for me, I asked her to call me back on the [phone] or [phone]

## 2015-03-16 ENCOUNTER — Inpatient Hospital Stay (HOSPITAL_COMMUNITY)
Admission: EM | Admit: 2015-03-16 | Discharge: 2015-03-20 | DRG: 552 | Disposition: A | Discharge status: Home Health | Payer: Medicare Other | Attending: Internal Medicine | Admitting: Internal Medicine

## 2015-03-16 ENCOUNTER — Encounter (HOSPITAL_COMMUNITY): Payer: Self-pay | Admitting: Neurology

## 2015-03-16 ENCOUNTER — Inpatient Hospital Stay (HOSPITAL_COMMUNITY): Payer: Medicare Other

## 2015-03-16 ENCOUNTER — Telehealth: Payer: Self-pay | Admitting: Licensed Clinical Social Worker

## 2015-03-16 DIAGNOSIS — I10 Essential (primary) hypertension: Secondary | ICD-10-CM | POA: Diagnosis present

## 2015-03-16 DIAGNOSIS — Z6841 Body Mass Index (BMI) 40.0 and over, adult: Secondary | ICD-10-CM

## 2015-03-16 DIAGNOSIS — M4806 Spinal stenosis, lumbar region: Secondary | ICD-10-CM | POA: Diagnosis not present

## 2015-03-16 DIAGNOSIS — M549 Dorsalgia, unspecified: Secondary | ICD-10-CM | POA: Diagnosis present

## 2015-03-16 DIAGNOSIS — Z79899 Other long term (current) drug therapy: Secondary | ICD-10-CM | POA: Diagnosis not present

## 2015-03-16 DIAGNOSIS — Z96651 Presence of right artificial knee joint: Secondary | ICD-10-CM | POA: Diagnosis present

## 2015-03-16 DIAGNOSIS — R52 Pain, unspecified: Secondary | ICD-10-CM

## 2015-03-16 DIAGNOSIS — I509 Heart failure, unspecified: Secondary | ICD-10-CM

## 2015-03-16 DIAGNOSIS — Z7901 Long term (current) use of anticoagulants: Secondary | ICD-10-CM

## 2015-03-16 DIAGNOSIS — R935 Abnormal findings on diagnostic imaging of other abdominal regions, including retroperitoneum: Secondary | ICD-10-CM

## 2015-03-16 DIAGNOSIS — I5032 Chronic diastolic (congestive) heart failure: Secondary | ICD-10-CM | POA: Diagnosis present

## 2015-03-16 DIAGNOSIS — M4640 Discitis, unspecified, site unspecified: Secondary | ICD-10-CM | POA: Diagnosis present

## 2015-03-16 DIAGNOSIS — K589 Irritable bowel syndrome without diarrhea: Secondary | ICD-10-CM | POA: Diagnosis present

## 2015-03-16 DIAGNOSIS — M4316 Spondylolisthesis, lumbar region: Secondary | ICD-10-CM | POA: Diagnosis not present

## 2015-03-16 DIAGNOSIS — Z96652 Presence of left artificial knee joint: Secondary | ICD-10-CM | POA: Diagnosis present

## 2015-03-16 DIAGNOSIS — R109 Unspecified abdominal pain: Secondary | ICD-10-CM

## 2015-03-16 DIAGNOSIS — G9589 Other specified diseases of spinal cord: Secondary | ICD-10-CM

## 2015-03-16 DIAGNOSIS — M47816 Spondylosis without myelopathy or radiculopathy, lumbar region: Secondary | ICD-10-CM

## 2015-03-16 DIAGNOSIS — M4646 Discitis, unspecified, lumbar region: Secondary | ICD-10-CM

## 2015-03-16 DIAGNOSIS — Z7952 Long term (current) use of systemic steroids: Secondary | ICD-10-CM | POA: Diagnosis not present

## 2015-03-16 DIAGNOSIS — N179 Acute kidney failure, unspecified: Secondary | ICD-10-CM | POA: Diagnosis not present

## 2015-03-16 DIAGNOSIS — E785 Hyperlipidemia, unspecified: Secondary | ICD-10-CM | POA: Diagnosis not present

## 2015-03-16 DIAGNOSIS — N281 Cyst of kidney, acquired: Secondary | ICD-10-CM | POA: Diagnosis not present

## 2015-03-16 DIAGNOSIS — Z01818 Encounter for other preprocedural examination: Secondary | ICD-10-CM

## 2015-03-16 DIAGNOSIS — K219 Gastro-esophageal reflux disease without esophagitis: Secondary | ICD-10-CM | POA: Diagnosis present

## 2015-03-16 DIAGNOSIS — M4626 Osteomyelitis of vertebra, lumbar region: Secondary | ICD-10-CM | POA: Diagnosis not present

## 2015-03-16 DIAGNOSIS — Z96611 Presence of right artificial shoulder joint: Secondary | ICD-10-CM | POA: Diagnosis present

## 2015-03-16 DIAGNOSIS — M47896 Other spondylosis, lumbar region: Principal | ICD-10-CM | POA: Diagnosis present

## 2015-03-16 DIAGNOSIS — F419 Anxiety disorder, unspecified: Secondary | ICD-10-CM | POA: Diagnosis present

## 2015-03-16 HISTORY — DX: Other chronic pain: G89.29

## 2015-03-16 HISTORY — DX: Unspecified chronic bronchitis: J42

## 2015-03-16 HISTORY — DX: Low back pain: M54.5

## 2015-03-16 HISTORY — DX: Low back pain, unspecified: M54.50

## 2015-03-16 HISTORY — DX: Cyst of kidney, acquired: N28.1

## 2015-03-16 LAB — COMPREHENSIVE METABOLIC PANEL
ALT: 11 U/L — ABNORMAL LOW (ref 14–54)
ANION GAP: 9 (ref 5–15)
AST: 15 U/L (ref 15–41)
Albumin: 3.5 g/dL (ref 3.5–5.0)
Alkaline Phosphatase: 53 U/L (ref 38–126)
BILIRUBIN TOTAL: 0.6 mg/dL (ref 0.3–1.2)
BUN: 10 mg/dL (ref 6–20)
CO2: 24 mmol/L (ref 22–32)
CREATININE: 1.15 mg/dL — AB (ref 0.44–1.00)
Calcium: 9.1 mg/dL (ref 8.9–10.3)
Chloride: 108 mmol/L (ref 101–111)
GFR calc Af Amer: 57 mL/min — ABNORMAL LOW (ref >60)
GFR calc non Af Amer: 49 mL/min — ABNORMAL LOW (ref >60)
GLUCOSE: 114 mg/dL — AB (ref 65–99)
Potassium: 4.5 mmol/L (ref 3.5–5.1)
SODIUM: 141 mmol/L (ref 135–145)
TOTAL PROTEIN: 6.9 g/dL (ref 6.5–8.1)

## 2015-03-16 LAB — CBC WITH DIFFERENTIAL/PLATELET
BASOS ABS: 0 10*3/uL (ref 0.0–0.1)
Basophils Relative: 1 % (ref 0–1)
EOS ABS: 0.1 10*3/uL (ref 0.0–0.7)
Eosinophils Relative: 3 % (ref 0–5)
HCT: 44 % (ref 36.0–46.0)
HEMOGLOBIN: 14.3 g/dL (ref 12.0–15.0)
LYMPHS ABS: 1.9 10*3/uL (ref 0.7–4.0)
LYMPHS PCT: 52 % — AB (ref 12–46)
MCH: 28.3 pg (ref 26.0–34.0)
MCHC: 32.5 g/dL (ref 30.0–36.0)
MCV: 87.1 fL (ref 78.0–100.0)
MONO ABS: 0.5 10*3/uL (ref 0.1–1.0)
MONOS PCT: 13 % — AB (ref 3–12)
NEUTROS PCT: 31 % — AB (ref 43–77)
Neutro Abs: 1.1 10*3/uL — ABNORMAL LOW (ref 1.7–7.7)
PLATELETS: 177 10*3/uL (ref 150–400)
RBC: 5.05 MIL/uL (ref 3.87–5.11)
RDW: 13.5 % (ref 11.5–15.5)
WBC: 3.6 10*3/uL — ABNORMAL LOW (ref 4.0–10.5)

## 2015-03-16 MED ORDER — HYDROMORPHONE HCL 1 MG/ML IJ SOLN
1.0000 mg | INTRAMUSCULAR | Status: AC | PRN
  Filled 2015-03-16: qty 1

## 2015-03-16 MED ORDER — LOSARTAN POTASSIUM 50 MG PO TABS
100.0000 mg | ORAL_TABLET | Freq: Every day | ORAL | Status: DC
  Administered 2015-03-17 – 2015-03-19 (×3): 100 mg via ORAL
  Filled 2015-03-16 (×3): qty 2

## 2015-03-16 MED ORDER — PANTOPRAZOLE SODIUM 40 MG PO TBEC
40.0000 mg | DELAYED_RELEASE_TABLET | Freq: Every day | ORAL | Status: DC
  Administered 2015-03-16 – 2015-03-19 (×4): 40 mg via ORAL
  Filled 2015-03-16 (×4): qty 1

## 2015-03-16 MED ORDER — HYDROCHLOROTHIAZIDE 12.5 MG PO CAPS: 12.5000 mg | ORAL_CAPSULE | Freq: Every day | ORAL | Status: AC

## 2015-03-16 MED ORDER — HYDROCHLOROTHIAZIDE 12.5 MG PO CAPS
12.5000 mg | ORAL_CAPSULE | Freq: Every day | ORAL | Status: DC
  Administered 2015-03-17 – 2015-03-19 (×3): 12.5 mg via ORAL
  Filled 2015-03-16 (×3): qty 1

## 2015-03-16 MED ORDER — HYDROMORPHONE HCL 1 MG/ML IJ SOLN
1.0000 mg | INTRAMUSCULAR | Status: DC | PRN
Start: 2015-03-16 — End: 2015-03-20
  Administered 2015-03-16 – 2015-03-20 (×13): 1 mg via INTRAVENOUS
  Filled 2015-03-16 (×12): qty 1

## 2015-03-16 MED ORDER — GADOBENATE DIMEGLUMINE 529 MG/ML IV SOLN
20.0000 mL | Freq: Once | INTRAVENOUS | Status: AC | PRN
  Administered 2015-03-16: 20 mL via INTRAVENOUS

## 2015-03-16 MED ORDER — POTASSIUM CHLORIDE CRYS ER 20 MEQ PO TBCR
20.0000 meq | EXTENDED_RELEASE_TABLET | Freq: Two times a day (BID) | ORAL | Status: DC
  Administered 2015-03-16 – 2015-03-19 (×4): 20 meq via ORAL
  Filled 2015-03-16 (×6): qty 1

## 2015-03-16 MED ORDER — DIPHENOXYLATE-ATROPINE 2.5-0.025 MG PO TABS: 1.0000 | ORAL_TABLET | Freq: Four times a day (QID) | ORAL | Status: DC | PRN

## 2015-03-16 MED ORDER — ALBUTEROL SULFATE (2.5 MG/3ML) 0.083% IN NEBU: 2.5000 mg | INHALATION_SOLUTION | Freq: Four times a day (QID) | RESPIRATORY_TRACT | Status: DC | PRN

## 2015-03-16 MED ORDER — ONDANSETRON HCL 4 MG/2ML IJ SOLN
4.0000 mg | Freq: Three times a day (TID) | INTRAMUSCULAR | Status: AC | PRN
  Filled 2015-03-16: qty 2

## 2015-03-16 MED ORDER — ONDANSETRON HCL 4 MG/2ML IJ SOLN
4.0000 mg | Freq: Once | INTRAMUSCULAR | Status: AC
  Administered 2015-03-16: 4 mg via INTRAVENOUS
  Filled 2015-03-16: qty 2

## 2015-03-16 MED ORDER — SODIUM CHLORIDE 0.9 % IV SOLN
INTRAVENOUS | Status: DC
  Administered 2015-03-17: 08:00:00 via INTRAVENOUS

## 2015-03-16 MED ORDER — ACETAMINOPHEN 650 MG RE SUPP: 650.0000 mg | Freq: Four times a day (QID) | RECTAL | Status: DC | PRN

## 2015-03-16 MED ORDER — LOSARTAN POTASSIUM-HCTZ 100-12.5 MG PO TABS: 1.0000 | ORAL_TABLET | Freq: Every day | ORAL | Status: DC

## 2015-03-16 MED ORDER — ALBUTEROL SULFATE HFA 108 (90 BASE) MCG/ACT IN AERS: 2.0000 | INHALATION_SPRAY | Freq: Four times a day (QID) | RESPIRATORY_TRACT | Status: DC | PRN

## 2015-03-16 MED ORDER — CITALOPRAM HYDROBROMIDE 20 MG PO TABS
40.0000 mg | ORAL_TABLET | Freq: Every day | ORAL | Status: DC
  Administered 2015-03-16 – 2015-03-19 (×4): 40 mg via ORAL
  Filled 2015-03-16 (×4): qty 2

## 2015-03-16 MED ORDER — ONDANSETRON HCL 4 MG/2ML IJ SOLN
4.0000 mg | Freq: Four times a day (QID) | INTRAMUSCULAR | Status: DC | PRN
  Administered 2015-03-16 – 2015-03-19 (×4): 4 mg via INTRAVENOUS
  Filled 2015-03-16 (×2): qty 2

## 2015-03-16 MED ORDER — OXYCODONE HCL 5 MG PO TABS
10.0000 mg | ORAL_TABLET | ORAL | Status: DC | PRN
  Administered 2015-03-17 – 2015-03-20 (×6): 10 mg via ORAL
  Filled 2015-03-16 (×7): qty 2

## 2015-03-16 MED ORDER — LOSARTAN POTASSIUM 50 MG PO TABS: 100.0000 mg | ORAL_TABLET | Freq: Every day | ORAL | Status: AC

## 2015-03-16 MED ORDER — ACETAMINOPHEN 325 MG PO TABS: 650.0000 mg | ORAL_TABLET | Freq: Four times a day (QID) | ORAL | Status: DC | PRN

## 2015-03-16 MED ORDER — ALUM & MAG HYDROXIDE-SIMETH 200-200-20 MG/5ML PO SUSP: 30.0000 mL | Freq: Four times a day (QID) | ORAL | Status: DC | PRN

## 2015-03-16 MED ORDER — HYDROMORPHONE HCL 1 MG/ML IJ SOLN
1.0000 mg | Freq: Once | INTRAMUSCULAR | Status: AC
  Administered 2015-03-16: 1 mg via INTRAVENOUS
  Filled 2015-03-16: qty 1

## 2015-03-16 MED ORDER — SIMVASTATIN 20 MG PO TABS
20.0000 mg | ORAL_TABLET | Freq: Every evening | ORAL | Status: DC
  Administered 2015-03-16 – 2015-03-19 (×3): 20 mg via ORAL
  Filled 2015-03-16 (×5): qty 1

## 2015-03-16 MED ORDER — PROMETHAZINE HCL 25 MG/ML IJ SOLN
12.5000 mg | Freq: Four times a day (QID) | INTRAMUSCULAR | Status: DC | PRN
  Administered 2015-03-16 – 2015-03-20 (×6): 12.5 mg via INTRAVENOUS
  Filled 2015-03-16 (×8): qty 1

## 2015-03-16 MED ORDER — CLONIDINE HCL 0.3 MG/24HR TD PTWK
0.3000 mg | MEDICATED_PATCH | TRANSDERMAL | Status: DC
  Administered 2015-03-17: 0.3 mg via TRANSDERMAL
  Filled 2015-03-16: qty 1

## 2015-03-16 MED ORDER — ONDANSETRON HCL 4 MG PO TABS
4.0000 mg | ORAL_TABLET | Freq: Four times a day (QID) | ORAL | Status: DC | PRN
Start: 2015-03-16 — End: 2015-03-20

## 2015-03-16 MED ORDER — FUROSEMIDE 80 MG PO TABS
80.0000 mg | ORAL_TABLET | Freq: Every day | ORAL | Status: DC
  Administered 2015-03-17 – 2015-03-18 (×2): 80 mg via ORAL
  Filled 2015-03-16 (×3): qty 1

## 2015-03-16 NOTE — ED Notes (Signed)
Attempted Report 

## 2015-03-16 NOTE — ED Notes (Signed)
Pt reports infection to spine since April, is seeing inf disease and waiting for them to schedule a biopsy. Reports pain from lower back radiating to left lower abd.
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
## 2015-03-16 NOTE — Consult Note (Signed)
[facility]    Date of Admission:  03/16/2015   Total days of antibiotics 0       Reason for Consult: Discitis/vertebral osteomyelitis    Referring Physician: Dr. Nehemiah Settle  Principal Problem:   Discitis of lumbosacral region Active Problems:   CHF (congestive heart failure)   Diskitis   . citalopram  40 mg Oral Daily  . cloNIDine  0.3 mg Transdermal Weekly  . furosemide  80 mg Oral Daily  . [START ON 03/17/2015] losartan  100 mg Oral Daily   And  . [START ON 03/17/2015] hydrochlorothiazide  12.5 mg Oral Daily  . losartan  100 mg Oral Daily   And  . hydrochlorothiazide  12.5 mg Oral Daily  . pantoprazole  40 mg Oral Daily  . potassium chloride SA  20 mEq Oral BID  . simvastatin  20 mg Oral QPM    Recommendations: 1. We recommend lumbar MRI with contrast 2. Continue to hold antibiotics until biopsy done 3. We recommend aspiration of disc space with interventional radiology 4. Upon aspiration, we recommend IV vancomycin and PO levofloxacin and will tailor pending aspiration results 5. She will need around 6 weeks of antibiotic therapy 6. Renal ultrasound  ADDENDUM: agree with note.  Likely discitis but will do with contrast to verify.  She did have xray that was negative recently.  Will need IR guided biopsy prior to starting antibiotics.    Scharlene Gloss, MD  Assessment: Mrs. 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 She was planning on getting the disc space aspirated this week but was unable to get an appointment. Since that time her pain has worsened but she denies signs of cord compression. This is most likely S. Aureus; however, the indolent nature and vertebral involvement demands Tuberculosis to be included on the differential, although she has no obvious risk factors. An incidental T2 hyperintense lesion was noted on MRI and recommended to be further evaluated with renal  ultrasound by radiology.  HPI: ZHANE DONLAN is a 64 y.o. female with history of CHF, IBS, GERD, spinal stenosis, and anxiety admitted 7/28 for discitis/vertebral osteomyelitis. Her lower back pain began in April 2016 and was associated with increased diarrhea. C diff PCR was negative a few weeks ago. In the interim she has been afebrile, walking well, without weakness, paresthesias, fevers, or weight loss. An MRI obtained 6/30 revealed L4-L5 discitis/vertebral osteomyelitis. She denies IV drug abuse. She has not been on antibiotics; swas planning on getting a biopsy over the last few weeks but wasn't able to get an appointment. She has not had spinal surgery before.  Review of Systems  Constitutional: Negative for fever, chills and weight loss.  Eyes: Negative for blurred vision.  Respiratory: Negative for cough and sputum production.   Cardiovascular: Negative for chest pain, palpitations and orthopnea.  Gastrointestinal: Positive for heartburn, abdominal pain and diarrhea. Negative for nausea, vomiting and constipation.  Genitourinary: Negative for dysuria and urgency.  Musculoskeletal: Positive for back pain.  Skin: Positive for rash.  Neurological: Negative for tingling, sensory change, focal weakness, weakness and headaches.    Past Medical History  Diagnosis Date  . Complication of anesthesia     @ [place].....COULDN'T GET HER AWAKE....FOR  HERNIA SURGERY... PLACED ON VENTILATOR  . CHF (congestive heart failure)     [name].......LAST OFFICE VISIT WAS A FEW AGO  . Bronchitis  USES NEB  AS NEEDED  . GERD (gastroesophageal reflux disease)     TAKES PRILOSEC BID  . 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 IBS (irritable bowel syndrome)   . Hypercholesteremia   . Anxiety     ANXIETY ONCE....PLACED ON CELEXA, controlling history of panic attacks   . Sleep difficulties     pt. states she had sleep eval > 10 yrs. ago in [place], no apnea found  . Arthritis      shoulder- R, L knee   . Hypertension     dr [name]  . Discitis of lumbosacral region 03/06/2015  . CHF (congestive heart failure) 03/06/2015  . Diarrhea 03/06/2015  . IBS (irritable bowel syndrome) 03/06/2015  . 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 Zoster 03/06/2015    History  Substance Use Topics  . Smoking status: Never Smoker   . Smokeless tobacco: Never Used  . Alcohol Use: No    Family History  Problem Relation Age of Onset  . Anesthesia problems Neg Hx    Allergies  Allergen Reactions  . Mushroom Extract Complex Hives and Itching  . Shellfish Allergy Hives and Itching  . Eggs Or Egg-Derived Products Nausea And Vomiting  . Aspirin     Stomach bleeding  . Betadine [Povidone Iodine]     unknown  . Coconut Flavor Itching  . Codeine Itching  . Iodine     unknown   . Ivp Dye [Iodinated Diagnostic Agents]     unknown   . Sulfa Antibiotics Hives and Itching    OBJECTIVE: Blood pressure 105/49, pulse 53, temperature 98 F (36.7 C), temperature source Oral, resp. rate 18, SpO2 100 %. General: Lying in bed playing on her phone. Skin: Several erythematous 85mm papules on anterior chest. Lungs: Clear anteriorly. Cor: Regular rate, rhythm. Normal S1/S2. No MGR. Abdomen: Soft, non-tender.  Lab Results Lab Results  Component Value Date   WBC 3.6* 03/16/2015   HGB 14.3 03/16/2015   HCT 44.0 03/16/2015   MCV 87.1 03/16/2015   PLT 177 03/16/2015    Lab Results  Component Value Date   CREATININE 1.15* 03/16/2015   BUN 10 03/16/2015   NA 141 03/16/2015   K 4.5 03/16/2015   CL 108 03/16/2015   CO2 24 03/16/2015    Lab Results  Component Value Date   ALT 11* 03/16/2015   AST 15 03/16/2015   ALKPHOS 53 03/16/2015   BILITOT 0.6 03/16/2015     Microbiology: Recent Results (from the past 240 hour(s))  Clostridium Difficile by PCR     Status: None   Collection Time: 03/06/15  5:04 PM  Result Value Ref Range  Status   C difficile by pcr Not Detected Not Detected Final    Comment: This test is for use only with liquid or soft stools; performance characteristics of other clinical specimen types have not been established.   This assay was performed by Cepheid GeneXpert(R) PCR. The performance characteristics of this assay have been determined by [employer]. Performance characteristics refer to the analytical performance of the test.     Loleta Chance, MD [facility] 03/16/2015, 4:50 PM

## 2015-03-16 NOTE — Telephone Encounter (Signed)
Patient had x ray on 7/21 before she could have lumbar puncture done. I have called to get this scheduled for the past 3 days and keep getting the run around in Radiology. The patient called stating that she was in so much pain she could not take it anymore and was going to the ED. We advised the patient that would be the best thing to do, patient was agreeable.

## 2015-03-16 NOTE — Progress Notes (Signed)
Called about patient regarding back pain and possible discitis/osteomyelitis.  She has had significant pain and has not yet had outpatient IR aspiration.  In review of MRI and recent xray, I would recommend: 1) MRI with contrast (previous without) 2) hold antibiotics 3) will consider IR guided biopsy Will follow inpatient.    Scharlene Gloss, MD
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
## 2015-03-17 ENCOUNTER — Encounter (HOSPITAL_COMMUNITY): Payer: Self-pay | Admitting: General Practice

## 2015-03-17 DIAGNOSIS — M549 Dorsalgia, unspecified: Secondary | ICD-10-CM | POA: Diagnosis not present

## 2015-03-17 DIAGNOSIS — K219 Gastro-esophageal reflux disease without esophagitis: Secondary | ICD-10-CM | POA: Diagnosis present

## 2015-03-17 DIAGNOSIS — Z6841 Body Mass Index (BMI) 40.0 and over, adult: Secondary | ICD-10-CM | POA: Diagnosis not present

## 2015-03-17 DIAGNOSIS — N179 Acute kidney failure, unspecified: Secondary | ICD-10-CM | POA: Diagnosis present

## 2015-03-17 DIAGNOSIS — I10 Essential (primary) hypertension: Secondary | ICD-10-CM | POA: Diagnosis not present

## 2015-03-17 DIAGNOSIS — M4806 Spinal stenosis, lumbar region: Secondary | ICD-10-CM | POA: Diagnosis not present

## 2015-03-17 DIAGNOSIS — M4316 Spondylolisthesis, lumbar region: Secondary | ICD-10-CM | POA: Diagnosis not present

## 2015-03-17 DIAGNOSIS — E785 Hyperlipidemia, unspecified: Secondary | ICD-10-CM | POA: Diagnosis present

## 2015-03-17 DIAGNOSIS — M47896 Other spondylosis, lumbar region: Secondary | ICD-10-CM | POA: Diagnosis not present

## 2015-03-17 DIAGNOSIS — M47816 Spondylosis without myelopathy or radiculopathy, lumbar region: Secondary | ICD-10-CM | POA: Diagnosis not present

## 2015-03-17 DIAGNOSIS — I5032 Chronic diastolic (congestive) heart failure: Secondary | ICD-10-CM | POA: Diagnosis not present

## 2015-03-17 DIAGNOSIS — M5137 Other intervertebral disc degeneration, lumbosacral region: Secondary | ICD-10-CM | POA: Diagnosis not present

## 2015-03-17 LAB — BASIC METABOLIC PANEL
Anion gap: 5 (ref 5–15)
BUN: 10 mg/dL (ref 6–20)
CO2: 30 mmol/L (ref 22–32)
Calcium: 8.9 mg/dL (ref 8.9–10.3)
Chloride: 104 mmol/L (ref 101–111)
Creatinine, Ser: 1.29 mg/dL — ABNORMAL HIGH (ref 0.44–1.00)
GFR calc Af Amer: 49 mL/min — ABNORMAL LOW (ref >60)
GFR calc non Af Amer: 42 mL/min — ABNORMAL LOW (ref >60)
GLUCOSE: 101 mg/dL — AB (ref 65–99)
Potassium: 4.3 mmol/L (ref 3.5–5.1)
Sodium: 139 mmol/L (ref 135–145)

## 2015-03-17 LAB — CBC
HCT: 41 % (ref 36.0–46.0)
Hemoglobin: 13.2 g/dL (ref 12.0–15.0)
MCH: 28.4 pg (ref 26.0–34.0)
MCHC: 32.2 g/dL (ref 30.0–36.0)
MCV: 88.4 fL (ref 78.0–100.0)
PLATELETS: 180 10*3/uL (ref 150–400)
RBC: 4.64 MIL/uL (ref 3.87–5.11)
RDW: 13.7 % (ref 11.5–15.5)
WBC: 5.2 10*3/uL (ref 4.0–10.5)

## 2015-03-17 MED ORDER — ENOXAPARIN SODIUM 40 MG/0.4ML ~~LOC~~ SOLN
40.0000 mg | SUBCUTANEOUS | Status: DC
  Administered 2015-03-17 – 2015-03-19 (×3): 40 mg via SUBCUTANEOUS
  Filled 2015-03-17 (×3): qty 0.4

## 2015-03-17 MED ORDER — ENSURE ENLIVE PO LIQD: 237.0000 mL | Freq: Two times a day (BID) | ORAL | Status: DC

## 2015-03-17 MED ORDER — PREDNISONE 20 MG PO TABS
40.0000 mg | ORAL_TABLET | Freq: Every day | ORAL | Status: DC
  Administered 2015-03-17 – 2015-03-18 (×2): 40 mg via ORAL
  Filled 2015-03-17 (×2): qty 2

## 2015-03-17 MED ORDER — BACLOFEN 10 MG PO TABS
5.0000 mg | ORAL_TABLET | Freq: Three times a day (TID) | ORAL | Status: DC
  Administered 2015-03-17 – 2015-03-18 (×4): 5 mg via ORAL
  Filled 2015-03-17 (×6): qty 1

## 2015-03-17 NOTE — Evaluation (Signed)
Physical Therapy Evaluation Patient Details Name: [name] MRN: [id number] DOB: 02/14/1951 Today's Date: 03/17/2015   History of Present Illness  Patient is a 64 y/o female with hx of CHF, IBS, HTN who presents to the ED for worsening back pain. Per neurosurgery, MRI spine-grade 1 spondylolisthesis L4-5 with some lateral recess stenosis and severe facet arthropathy and some Modic endplate changes anteriorly in the disc space. Plan for conservative management.    Clinical Impression  Patient presents with back pain radiating into LLE and dyspnea on exertion impacting mobility. Instructed pt in proper body mechanics, back precautions and energy conservation techniques. Pt has difficulty performing ADLs due to back pain and requires frequent seated rest breaks at home due to fatigue/pain. Pt has limited standing tolerance. Would greatly benefit from use of rollator to improve balance, tolerance to activity and endurance/energy conservation. However pt wants to see rollator prior to ordering one. Will follow acutely to maximize independence and mobility prior to return home.    Follow Up Recommendations Home health PT;Supervision - Intermittent    Equipment Recommendations  Other (comment) (rollator (needs to see it first))    Recommendations for Other Services       Precautions / Restrictions Precautions Precautions: Back;Fall Precaution Booklet Issued: No Precaution Comments: Reviewed back precautions and proper body mechanics. Required Braces or Orthoses: Spinal Brace Spinal Brace: Lumbar corset (Ordered but not in room yet.) Restrictions Weight Bearing Restrictions: No      Mobility  Bed Mobility Overal bed mobility: Needs Assistance Bed Mobility: Rolling;Sidelying to Sit;Sit to Sidelying Rolling: Supervision Sidelying to sit: Min guard;HOB elevated     Sit to sidelying: Min guard;HOB elevated General bed mobility comments: HOB elevates 30 degrees. Instructed pt in log  roll technique and positioning in bed with pillows for comfort. USe of rails. Pt has table she grabs onto.  Transfers Overall transfer level: Needs assistance Equipment used: None Transfers: Sit to/from Stand Sit to Stand: Min guard         General transfer comment: Mildly unsteady upon standing most likely 2/2 to being given dilaudid prior to arrival.   Ambulation/Gait Ambulation/Gait assistance: Min guard Ambulation Distance (Feet): 50 Feet Assistive device: None Gait Pattern/deviations: Step-through pattern;Wide base of support;Drifts right/left   Gait velocity interpretation: <1.8 ft/sec, indicative of risk for recurrent falls General Gait Details: Slow, guarded gait with UEs out to the side for balance. Dyspnea present.  Stairs            Wheelchair Mobility    Modified Rankin (Stroke Patients Only)       Balance Overall balance assessment: Needs assistance Sitting-balance support: Feet supported;No upper extremity supported Sitting balance-Leahy Scale: Fair     Standing balance support: During functional activity Standing balance-Leahy Scale: Fair                               Pertinent Vitals/Pain Pain Assessment: Faces Faces Pain Scale: Hurts little more Pain Location: back Pain Descriptors / Indicators: Sore;Sharp Pain Intervention(s): Monitored during session;Premedicated before session;Repositioned    Home Living Family/patient expects to be discharged to:: Private residence Living Arrangements: Alone Available Help at Discharge: Family;Available PRN/intermittently Type of Home: Apartment Home Access: Level entry     Home Layout: One level Home Equipment: Walker - 2 wheels;Bedside commode      Prior Function Level of Independence: Needs assistance   Gait / Transfers Assistance Needed: Reports difficulty with ambulation requiring frequent  rest breaks. Does not like to use RW.      Comments: Aide comes in everyday for 2  hours toa ssist with IADLs.      Hand Dominance        Extremity/Trunk Assessment   Upper Extremity Assessment: Defer to OT evaluation           Lower Extremity Assessment: Generalized weakness         Communication   Communication: No difficulties  Cognition Arousal/Alertness: Lethargic;Suspect due to medications Behavior During Therapy: [facility] for tasks assessed/performed Overall Cognitive Status: Within Functional Limits for tasks assessed (Declines use of RW due to the look of it despite need for safety.) Area of Impairment: Safety/judgement         Safety/Judgement: Decreased awareness of safety          General Comments General comments (skin integrity, edema, etc.): Family present in room during session. Discussed proper body mechanics, positioning, importance of using rollator and functionality of it etc.     Exercises        Assessment/Plan    PT Assessment Patient needs continued PT services  PT Diagnosis Difficulty walking;Acute pain   PT Problem List Decreased strength;Pain;Cardiopulmonary status limiting activity;Decreased activity tolerance;Decreased balance;Decreased mobility;Decreased knowledge of precautions;Decreased safety awareness  PT Treatment Interventions Balance training;Gait training;Functional mobility training;Therapeutic activities;Therapeutic exercise;Patient/family education;DME instruction   PT Goals (Current goals can be found in the Care Plan section) Acute Rehab PT Goals Patient Stated Goal: to get myself straightened out PT Goal Formulation: With patient Time For Goal Achievement: 03/31/15 Potential to Achieve Goals: Good    Frequency Min 3X/week   Barriers to discharge Decreased caregiver support Pt lives alone.    Co-evaluation               End of Session Equipment Utilized During Treatment: Gait belt Activity Tolerance: Patient tolerated treatment well Patient left: in bed;with call bell/phone within  reach;with bed alarm set;with family/visitor present Nurse Communication: Mobility status         Time: 4917-9150 PT Time Calculation (min) (ACUTE ONLY): 28 min   Charges:   PT Evaluation $Initial PT Evaluation Tier I: 1 Procedure PT Treatments $Therapeutic Activity: 8-22 mins   PT G Codes:        Jonel Weldon A Ladye Macnaughton 03/17/2015, 3:29 PM  Wray Kearns, [place], DPT [phone]

## 2015-03-17 NOTE — Progress Notes (Addendum)
I was called to review the films on this pt. She has had pain for 6 months, progressive, severe, better when walking flexed. MRI was read as suggestive of pedis and osteomyelitis at L4-5. She was admitted for workup. Repeat MRI to me shows no evidence of discitis [place] myelitis. A looks like she has a grade 1 spondylolisthesis L4-5 with some lateral recess stenosis and severe facet arthropathy and some Modic endplate changes anteriorly in the disc space. There is not a lot of loss of disc space height. There is also degenerative disc disease at L5-S1. There is minimal enhancement. No epidural process. Moderate stenosis at L2-3 without listhesis. Plain films confirm a grade 1 spondylolisthesis at L4-5. I am unsure whether or not these are standing plain films but it looks less conspicuous on the plain films than on the MRI. I see no evidence of destruction of the disc space to suggest discitis or osteomyelitis.  She does complain of bilateral leg pain, especially when walking. It is aching and throbbing. It is mostly in the anterior thighs and it seems to be right more than left. No bowel or bladder changes. Her symptoms are progressive over 6 months. No fevers or chills. No other red flag signs. Medication seemed to help somewhat.  On physical exam she is awake and alert and pleasant and interactive. She appears to be in no acute distress. She is obese. She is sitting up eating lunch. He moves all extremities well. She seems to have good strength. She does have tenderness in the lumbar spine but it is exaggerated. I did not ambulate her.  I would doubt discitis and osteomyelitis at this time based on the history provided to me and the current imaging. Her sedimentation rate was 27 and CRP 1.6 which is minimally elevated, and while not specific or necessarily sensitive for spine infection these certainly suggest that this is not an overwhelming spine infection such as discitis or osteomyelitis and I think  this is more of a degenerative process.  I would recommend 1. Pain control with oral medications such as analgesic, anti-inflammatory's, potentially oral steroid-induced, neural modulating drugs, and muscle relaxants, 2. A Quikdraw may be of benefit the cause of the spondylolisthesis, 3. I would think an epidural steroid injection and/or facet blocks at L4-5 would be helpful and potentially an epidural steroid injection at L2-3.Marland Kitchen 4. Physical therapy. 5. a CT myelogram of the lumbar spine may be beneficial in further evaluating the instability at L4-5 and the amount of stenosis at L2-3 and L4-5, 6. counseling regarding aggressive weight loss, 7. [place] regarding smoking cessation if she is a smoker, 8. Strict control of blood glucose levels if she is diabetic  I would be happy to see her as an outpatient and continue workup and management. I certainly do not believe there is any indication for operative intervention during this hospitalization based on current information.

## 2015-03-17 NOTE — Progress Notes (Signed)
Dr. Estanislado Pandy has spoke with Dr. Linus Salmons today and feels disc aspiration is not indicated at this time without any clinical or imaging evidence of discitis. Please call Dr. Estanislado Pandy with any questions [phone]  Tsosie Billing PA-C Interventional Radiology  03/17/15  9:28 AM

## 2015-03-17 NOTE — Progress Notes (Signed)
[facility]  Date of Admission:  03/16/2015  Antibiotics: none  Subjective: pain  Objective: Temp:  [97.7 F (36.5 C)-98.9 F (37.2 C)] 97.9 F (36.6 C) (07/29 1358) Pulse Rate:  [53-63] 59 (07/29 1358) Resp:  [18-19] 18 (07/29 0459) BP: (105-140)/(49-73) 114/60 mmHg (07/29 1358) SpO2:  [98 %-100 %] 100 % (07/29 1358)   Lab Results Lab Results  Component Value Date   WBC 5.2 03/17/2015   HGB 13.2 03/17/2015   HCT 41.0 03/17/2015   MCV 88.4 03/17/2015   PLT 180 03/17/2015    Lab Results  Component Value Date   CREATININE 1.29* 03/17/2015   BUN 10 03/17/2015   NA 139 03/17/2015   K 4.3 03/17/2015   CL 104 03/17/2015   CO2 30 03/17/2015    Lab Results  Component Value Date   ALT 11* 03/16/2015   AST 15 03/16/2015   ALKPHOS 53 03/16/2015   BILITOT 0.6 03/16/2015    GEN: awake, nad CV RRR Lungs: CTA B Abd: obese, nt, nd   Microbiology: No results found for this or any previous visit (from the past 240 hour(s)).  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 Pain radiating to buttock for several weeks.  EXAM: MRI LUMBAR SPINE WITHOUT AND WITH CONTRAST  TECHNIQUE: Multiplanar and multiecho pulse sequences of the lumbar spine were obtained without and with intravenous contrast.  CONTRAST:  52mL MULTIHANCE GADOBENATE DIMEGLUMINE 529 MG/ML IV SOLN  COMPARISON:  MRI of the lumbar spine February 06, 2015  FINDINGS: Mild motion degraded examination. Using the reference level of the last well-formed intervertebral disc as L5-S1, minimal grade 1 L4-5 anterolisthesis (2 mm), similar to prior examination. No spondylolysis. Linear enhancing T2 bright signal along the ventral and dorsal aspect the L4-5 disc without central disc enhancement no paraspinal fluid collections. Geographic low T1, bright T2 enhancing discogenic  endplate changes B1-6, progressed from prior examination. Enhancing bright T1 and bright T2 sacral hemangioma. No suspicious osseous enhancement.  Mild L2-3, moderate L5-S1 disc height loss, similar to prior examination with decreased T2 signal within the lumbar disc consistent with mild desiccation, and slight edema within the L5-S1 disc, progressed from prior examination. Moderate acute on chronic discogenic endplate changes X4-H0. Mild acute discogenic endplate changes T8-8, E2-8.  Conus medullaris terminates at L1-2 and appears normal morphology and signal characteristics. Included view of the thoracic spine demonstrates ligamentum flavum redundancy most apparent at T10-11 resulting in at least moderate canal stenosis at this level, moderate canal stenosis T11-12 due to small disc protrusion, and prominent epidural fat. Cauda equina is normal in appearance. No abnormal cord, leptomeningeal or epidural enhancement. T2 bright nonenhancing cyst RIGHT kidney. Mild symmetric paraspinal muscle atrophy.  Level by level evaluation:  T12-L1: Annular bulging, moderate RIGHT and mild LEFT facet arthropathy without canal stenosis or neural foraminal narrowing.  L1-2: Small LEFT extra foraminal disc protrusion. Moderate facet arthropathy with ligamentum flavum redundancy. No canal stenosis or neural foraminal narrowing.  L2-3: Stable 3 mm broad-based disc bulge eccentric laterally which could affect the exited L2 nerves, similar. Severe facet arthropathy and ligamentum flavum redundancy with trace facet effusions which are likely reactive. Moderate canal stenosis. Moderate LEFT greater than RIGHT neural foraminal narrowing.  L3-4: Small broad-based disc bulge eccentric laterally could affect the exited LEFT greater than RIGHT L3 nerves, similar. Severe facet arthropathy and  ligamentum flavum redundancy. Mild canal stenosis. Mild RIGHT, moderate LEFT neural foraminal narrowing.  L4-5: Anterolisthesis. Small broad-based disc  bulge eccentric laterally. Severe facet arthropathy and ligamentum flavum redundancy. Mild canal stenosis. Moderate bilateral neural foraminal narrowing.  L5-S1: Small broad-based disc bulge eccentric to the LEFT may affect the exited LEFT L5 nerve. Moderate facet arthropathy without canal stenosis. Mild to moderate bilateral neural foraminal narrowing.  IMPRESSION: Mild abnormal enhancement L4-5 though, and slightly progressed bone marrow edema at L4-5, findings favor degenerative change considering slow progression, less likely discitis without MR findings of osteomyelitis though, recommend correlation with CRP/ESR.  Moderate canal stenosis L2-3, mild at L3-4 and L4-5.  Neural foraminal narrowing L2-3 through L5-S1: Moderate at L2-3 through L4-5.   Electronically Signed   By: Elon Alas M.D.   On: 03/17/2015 00:38   US Renal  03/17/2015   CLINICAL DATA:  Right renal lesion on MRI  EXAM: RENAL / URINARY TRACT ULTRASOUND COMPLETE  COMPARISON:  01/2015 lumbar MRI  FINDINGS: Right Kidney:  Length: 12.2 cm. 3 cm benign simple cyst of the right midpole region. No hydronephrosis.  Left Kidney:  Length: 8.6 cm, probably not accurately measured. Limited view due to patient body habitus. Grossly negative for hydronephrosis.  Bladder:  Appears normal for degree of bladder distention.  IMPRESSION: There is a benign 3 cm cyst of the right renal midpole which accounts for the MR abnormality. No hydronephrosis. Limited views of the left kidney.   Electronically Signed   By: Andreas Newport M.D.   On: 03/17/2015 02:58    Assessment/Plan:  1) back pain - case discussed with radiology, Dr. Cruzita Lederer.  MRI results with contrast noted and CRP 1.6, ESR 27 not c/w obvious infection.  Therefore will hold on doing any aspiration of the disc. She has been seen by Dr. Ronnald Ramp who will see her as an outpatient for further management.   We will not make ID follow up at this time, though available if needed. Thanks    Scharlene Gloss, [facility] [place] [email or web address] [phone] pager   [phone] cell 03/17/2015, 3:08 PM

## 2015-03-17 NOTE — Progress Notes (Signed)
Orthopedic Tech Progress Note Patient Details:  [name] [birth year] [id number]  Patient ID: [name], female   DOB: November 20, 1950, 64 y.o.   MRN: [id number] Called in bio-tech brace order; spoke with Ramonita Lab, Selassie Spatafore 03/17/2015, 2:51 PM

## 2015-03-17 NOTE — Progress Notes (Addendum)
PROGRESS NOTE  [name] LQX:[id number] DOB: 01-Oct-1950 DOA: 03/16/2015 PCP: Dorrene German, MD  HPI: [name] is a 64 y.o. female With a history of CHF with no recent echocardiogram in the chart, GERD, irritable bowel syndrome, hypertension. The patient presents emergency department with back pain that started 4 months ago has gradually become worse. The pain is described as constant, worse with movements and improved with rest. Her pain is worsened with prolonged standing or ambulation. When she does state she tends to lean forward to help ease the pain. She had a MRI of her lumbar spine in 02/06/2015 which showed L4-L5 discitis with potential osteomyelitis to the adjacent vertebral bodies. The patient was sent to infectious disease. A lumbar spine x-ray was obtained approximately 7 days ago which showed no obvious destructive changes are obvious demineralization around the L4-L5 disc space that would be suggestive of osteomyelitis. Attends were made to obtain a aspiration of the disc for cultures, however this was not able to be obtained. Due to increasing pain and difficulty ventilating, the patient presents to the emergency department. Her pain radiates into her buttock which is new over the past several weeks.  Subjective / 24 H Interval events - complains of back pain - no chest pain, shortness of breath, no abdominal pain, nausea or vomiting.   Assessment/Plan: Active Problems:   CHF (congestive heart failure)   Morbid obesity   DJD (degenerative joint disease), lumbar   Essential hypertension   GERD (gastroesophageal reflux disease)   HLD (hyperlipidemia)   AKI (acute kidney injury)   DJD, lumbar - MRI as below, without evidence of diskitis or osteomyelitis - cancelled IR aspiration as likelyhood of infection minimal, she has normal Sed Rate and mildly elevated CRP. - discussed with Dr. Yetta Barre from neurosurgery, appreciate his expertise and the fact that he saw the  patient and reviewed the MRI. Will start conservative management with oxycodone/muscle relaxer and prednisone. Discussed with IR again and they don't do epidural or facet blocks and recommended me to discuss with fluoro radiology. Fluoro radiology does not do those procedures inpatient and recommended outpatient referral at the spine center [phone]) - ordered a Quickdraw brace - patient will have follow up with Dr. Yetta Barre as an outpatient  AKI - suspect mild underlying CKD, stable renal function  Chronic CHF, likely diastolic - no echos in the system, likely diastolic - continue Lasix  Morbid obesity - counseled for weight loss  HTN - continue home medications  GERD - continue PPI  HLD - continue statin   Diet: Diet regular Room service appropriate?: Yes; Fluid consistency:: Thin Fluids: none  DVT Prophylaxis: lovenox  Code Status: Full Code Family Communication: d/w daughters bedside  Disposition Plan: home when ready   Consultants:  Neurosurgery   IR  ID  Procedures:  None    Antibiotics  Anti-infectives    None      Studies  Mr Lumbar Spine W Wo Contrast  03/17/2015   CLINICAL DATA:  Gradually worsening back pain for 6 months, 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 Pain radiating to buttock for several weeks.  EXAM: MRI LUMBAR SPINE WITHOUT AND WITH CONTRAST  TECHNIQUE: Multiplanar and multiecho pulse sequences of the lumbar spine were obtained without and with intravenous contrast.  CONTRAST:  34mL MULTIHANCE GADOBENATE DIMEGLUMINE 529 MG/ML IV SOLN  COMPARISON:  MRI of the lumbar spine February 06, 2015  FINDINGS: Mild motion degraded examination. Using the reference level of the last well-formed  intervertebral disc as L5-S1, minimal grade 1 L4-5 anterolisthesis (2 mm), similar to prior examination. No spondylolysis. Linear enhancing T2 bright signal along the ventral and dorsal aspect the L4-5 disc without central disc enhancement no  paraspinal fluid collections. Geographic low T1, bright T2 enhancing discogenic endplate changes G2-6, progressed from prior examination. Enhancing bright T1 and bright T2 sacral hemangioma. No suspicious osseous enhancement.  Mild L2-3, moderate L5-S1 disc height loss, similar to prior examination with decreased T2 signal within the lumbar disc consistent with mild desiccation, and slight edema within the L5-S1 disc, progressed from prior examination. Moderate acute on chronic discogenic endplate changes R4-W5. Mild acute discogenic endplate changes I6-2, V0-3.  Conus medullaris terminates at L1-2 and appears normal morphology and signal characteristics. Included view of the thoracic spine demonstrates ligamentum flavum redundancy most apparent at T10-11 resulting in at least moderate canal stenosis at this level, moderate canal stenosis T11-12 due to small disc protrusion, and prominent epidural fat. Cauda equina is normal in appearance. No abnormal cord, leptomeningeal or epidural enhancement. T2 bright nonenhancing cyst RIGHT kidney. Mild symmetric paraspinal muscle atrophy.  Level by level evaluation:  T12-L1: Annular bulging, moderate RIGHT and mild LEFT facet arthropathy without canal stenosis or neural foraminal narrowing.  L1-2: Small LEFT extra foraminal disc protrusion. Moderate facet arthropathy with ligamentum flavum redundancy. No canal stenosis or neural foraminal narrowing.  L2-3: Stable 3 mm broad-based disc bulge eccentric laterally which could affect the exited L2 nerves, similar. Severe facet arthropathy and ligamentum flavum redundancy with trace facet effusions which are likely reactive. Moderate canal stenosis. Moderate LEFT greater than RIGHT neural foraminal narrowing.  L3-4: Small broad-based disc bulge eccentric laterally could affect the exited LEFT greater than RIGHT L3 nerves, similar. Severe facet arthropathy and ligamentum flavum redundancy. Mild canal stenosis. Mild RIGHT, moderate  LEFT neural foraminal narrowing.  L4-5: Anterolisthesis. Small broad-based disc bulge eccentric laterally. Severe facet arthropathy and ligamentum flavum redundancy. Mild canal stenosis. Moderate bilateral neural foraminal narrowing.  L5-S1: Small broad-based disc bulge eccentric to the LEFT may affect the exited LEFT L5 nerve. Moderate facet arthropathy without canal stenosis. Mild to moderate bilateral neural foraminal narrowing.  IMPRESSION: Mild abnormal enhancement L4-5 though, and slightly progressed bone marrow edema at L4-5, findings favor degenerative change considering slow progression, less likely discitis without MR findings of osteomyelitis though, recommend correlation with CRP/ESR.  Moderate canal stenosis L2-3, mild at L3-4 and L4-5.  Neural foraminal narrowing L2-3 through L5-S1: Moderate at L2-3 through L4-5.   Electronically Signed   By: Elon Alas M.D.   On: 03/17/2015 00:38   US Renal  03/17/2015   CLINICAL DATA:  Right renal lesion on MRI  EXAM: RENAL / URINARY TRACT ULTRASOUND COMPLETE  COMPARISON:  01/2015 lumbar MRI  FINDINGS: Right Kidney:  Length: 12.2 cm. 3 cm benign simple cyst of the right midpole region. No hydronephrosis.  Left Kidney:  Length: 8.6 cm, probably not accurately measured. Limited view due to patient body habitus. Grossly negative for hydronephrosis.  Bladder:  Appears normal for degree of bladder distention.  IMPRESSION: There is a benign 3 cm cyst of the right renal midpole which accounts for the MR abnormality. No hydronephrosis. Limited views of the left kidney.   Electronically Signed   By: Andreas Newport M.D.   On: 03/17/2015 02:58   Objective  Filed Vitals:   03/16/15 1550 03/16/15 1900 03/17/15 0459 03/17/15 1358  BP: 105/49 140/73 123/64 114/60  Pulse: 53 63 55 59  Temp: 98  F (36.7 C) 98.9 F (37.2 C) 97.7 F (36.5 C) 97.9 F (36.6 C)  TempSrc: Oral Oral Oral Oral  Resp:  19 18   SpO2: 100% 98% 99% 100%    Intake/Output Summary  (Last 24 hours) at 03/17/15 1443 Last data filed at 03/17/15 1358  Gross per 24 hour  Intake    360 ml  Output      0 ml  Net    360 ml   Exam:  General:  NAD, sitting in chair  HEENT: no scleral icterus  Cardiovascular: RRR without MRG, 2+ peripheral pulses, no edema  Respiratory: CTA biL, good air movement, no wheezing, no crackles, no rales, difficult exam due to body habitus  Abdomen: soft, non tender, BS +, no guarding  MSK/Extremities: no clubbing/cyanosis, no joint swelling; out of proportion tenderness throughout T and L spine vertebrae   Skin: no rashes  Neuro: non focal, strength 5/5 in all 4   Data Reviewed: Basic Metabolic Panel:  Recent Labs Lab 03/16/15 1120 03/17/15 0339  NA 141 139  K 4.5 4.3  CL 108 104  CO2 24 30  GLUCOSE 114* 101*  BUN 10 10  CREATININE 1.15* 1.29*  CALCIUM 9.1 8.9   Liver Function Tests:  Recent Labs Lab 03/16/15 1120  AST 15  ALT 11*  ALKPHOS 53  BILITOT 0.6  PROT 6.9  ALBUMIN 3.5   CBC:  Recent Labs Lab 03/16/15 1120 03/17/15 0339  WBC 3.6* 5.2  NEUTROABS 1.1*  --   HGB 14.3 13.2  HCT 44.0 41.0  MCV 87.1 88.4  PLT 177 180   Scheduled Meds: . baclofen  5 mg Oral TID  . citalopram  40 mg Oral Daily  . cloNIDine  0.3 mg Transdermal Weekly  . enoxaparin (LOVENOX) injection  40 mg Subcutaneous Q24H  . furosemide  80 mg Oral Daily  . losartan  100 mg Oral Daily   And  . hydrochlorothiazide  12.5 mg Oral Daily  . pantoprazole  40 mg Oral Daily  . potassium chloride SA  20 mEq Oral BID  . predniSONE  40 mg Oral Q breakfast  . simvastatin  20 mg Oral QPM   Continuous Infusions: . sodium chloride 75 mL/hr at 03/17/15 0744   Time spent coordinating care: 35 minutes   Marzetta Board, MD [facility] Pager [phone]. If 7 PM - 7 AM, please contact night-coverage at [email or web address], password [facility] 03/17/2015, 2:43 PM  LOS: 1 day

## 2015-03-17 NOTE — Progress Notes (Signed)
Orthopedic Tech Progress Note Patient Details:  [name] 03/07/1951 [id number]  Patient ID: [name], female   DOB: 25-Jun-1951, 64 y.o.   MRN: [id number] Brace order completed by Warnell Forester, Nilson Tabora 03/17/2015, 3:47 PM

## 2015-03-18 ENCOUNTER — Inpatient Hospital Stay (HOSPITAL_COMMUNITY): Payer: Medicare Other

## 2015-03-18 DIAGNOSIS — R112 Nausea with vomiting, unspecified: Secondary | ICD-10-CM | POA: Diagnosis not present

## 2015-03-18 DIAGNOSIS — M4806 Spinal stenosis, lumbar region: Secondary | ICD-10-CM | POA: Diagnosis not present

## 2015-03-18 DIAGNOSIS — M47896 Other spondylosis, lumbar region: Secondary | ICD-10-CM | POA: Diagnosis not present

## 2015-03-18 DIAGNOSIS — M47816 Spondylosis without myelopathy or radiculopathy, lumbar region: Secondary | ICD-10-CM | POA: Diagnosis not present

## 2015-03-18 DIAGNOSIS — R1030 Lower abdominal pain, unspecified: Secondary | ICD-10-CM | POA: Diagnosis not present

## 2015-03-18 DIAGNOSIS — Z6841 Body Mass Index (BMI) 40.0 and over, adult: Secondary | ICD-10-CM | POA: Diagnosis not present

## 2015-03-18 DIAGNOSIS — I5032 Chronic diastolic (congestive) heart failure: Secondary | ICD-10-CM | POA: Diagnosis not present

## 2015-03-18 DIAGNOSIS — M549 Dorsalgia, unspecified: Secondary | ICD-10-CM | POA: Diagnosis not present

## 2015-03-18 DIAGNOSIS — N179 Acute kidney failure, unspecified: Secondary | ICD-10-CM | POA: Diagnosis not present

## 2015-03-18 DIAGNOSIS — M4316 Spondylolisthesis, lumbar region: Secondary | ICD-10-CM | POA: Diagnosis not present

## 2015-03-18 LAB — COMPREHENSIVE METABOLIC PANEL
ALBUMIN: 3.8 g/dL (ref 3.5–5.0)
ALT: 15 U/L (ref 14–54)
AST: 18 U/L (ref 15–41)
Alkaline Phosphatase: 67 U/L (ref 38–126)
Anion gap: 10 (ref 5–15)
BUN: 19 mg/dL (ref 6–20)
CO2: 29 mmol/L (ref 22–32)
Calcium: 9.5 mg/dL (ref 8.9–10.3)
Chloride: 99 mmol/L — ABNORMAL LOW (ref 101–111)
Creatinine, Ser: 1.46 mg/dL — ABNORMAL HIGH (ref 0.44–1.00)
GFR calc Af Amer: 42 mL/min — ABNORMAL LOW (ref >60)
GFR calc non Af Amer: 37 mL/min — ABNORMAL LOW (ref >60)
GLUCOSE: 128 mg/dL — AB (ref 65–99)
POTASSIUM: 4.5 mmol/L (ref 3.5–5.1)
Sodium: 138 mmol/L (ref 135–145)
TOTAL PROTEIN: 7.7 g/dL (ref 6.5–8.1)
Total Bilirubin: 0.5 mg/dL (ref 0.3–1.2)

## 2015-03-18 LAB — URINALYSIS, ROUTINE W REFLEX MICROSCOPIC
Bilirubin Urine: NEGATIVE
Glucose, UA: NEGATIVE mg/dL
HGB URINE DIPSTICK: NEGATIVE
Ketones, ur: NEGATIVE mg/dL
Leukocytes, UA: NEGATIVE
Nitrite: NEGATIVE
Protein, ur: NEGATIVE mg/dL
SPECIFIC GRAVITY, URINE: 1.011 (ref 1.005–1.030)
Urobilinogen, UA: 0.2 mg/dL (ref 0.0–1.0)
pH: 5 (ref 5.0–8.0)

## 2015-03-18 LAB — CBC
HEMATOCRIT: 46 % (ref 36.0–46.0)
HEMOGLOBIN: 15 g/dL (ref 12.0–15.0)
MCH: 28.5 pg (ref 26.0–34.0)
MCHC: 32.6 g/dL (ref 30.0–36.0)
MCV: 87.5 fL (ref 78.0–100.0)
Platelets: 214 10*3/uL (ref 150–400)
RBC: 5.26 MIL/uL — AB (ref 3.87–5.11)
RDW: 13.4 % (ref 11.5–15.5)
WBC: 4.7 10*3/uL (ref 4.0–10.5)

## 2015-03-18 LAB — LIPASE, BLOOD: LIPASE: 39 U/L (ref 22–51)

## 2015-03-18 MED ORDER — SENNOSIDES-DOCUSATE SODIUM 8.6-50 MG PO TABS
2.0000 | ORAL_TABLET | Freq: Once | ORAL | Status: AC
  Administered 2015-03-18: 2 via ORAL
  Filled 2015-03-18: qty 2

## 2015-03-18 MED ORDER — SODIUM CHLORIDE 0.9 % IV SOLN
INTRAVENOUS | Status: DC
  Administered 2015-03-18 – 2015-03-19 (×3): via INTRAVENOUS

## 2015-03-18 MED ORDER — BOOST / RESOURCE BREEZE PO LIQD
1.0000 | Freq: Two times a day (BID) | ORAL | Status: DC
  Administered 2015-03-18 – 2015-03-19 (×3): 1 via ORAL

## 2015-03-18 NOTE — Progress Notes (Addendum)
Initial Nutrition Assessment  DOCUMENTATION CODES:  Morbid obesity  INTERVENTION:  Boost Breeze po BID, each supplement provides 250 kcal and 9 grams of protein  NUTRITION DIAGNOSIS:  Unintentional weight loss related to altered GI function as evidenced by per patient/family report.  GOAL:  Patient will meet greater than or equal to 90% of their needs  MONITOR:  PO intake, Supplement acceptance, Diet advancement, Labs, I & O's  REASON FOR ASSESSMENT:  Malnutrition Screening Tool    ASSESSMENT:  63 y.o. female PMHx CHF, GERD, IBS, HTN. The patient presents to ED with back pain that started 4 months ago has gradually become worse.  Pt reports that her normal weight is slightly over 300 lbs and that she has gone up and down over the past year. Note that EPIC documentation does not support any weight loss.   She believes her subjective weight loss is related to her IBS. She says she has up to 15 bowel movements a day. Initially, she stated all foods affect her IBS the same and there arent specific aggravating foods. However she later stated that there are foods she can not tolerate. She states she can tolerate grapes, but not raisins as well as some other foods: lettuce, corn, greens, seafood  She states that she does follow a low sodium diet. She also just recently started taking vitamin D.   Will order boost breeze per patient request.   Diet Order:  Diet clear liquid Room service appropriate?: Yes; Fluid consistency:: Thin  Skin:  Reviewed, no issues  Last BM:  7/28  Height:  Ht Readings from Last 1 Encounters:  03/17/15 5\' 3"  (1.6 m)   Weight:  Wt Readings from Last 1 Encounters:  03/06/15 269 lb (122.018 kg)   Ideal Body Weight:  52.3 kg  DPO:EUMPN weight from 7/18: 47.7  Estimated Nutritional Needs:  Kcal:  1350-1600 (11-13 kcal/kg) Protein:  52-63 (1-1.2 g/kg IBW) Fluid:  1.4-1.6 liters  EDUCATION NEEDS:  No education needs identified at this time  Burtis Junes RD, LDN Nutrition Pager: [phone] 03/18/2015 2:06 PM

## 2015-03-18 NOTE — Progress Notes (Signed)
PROGRESS NOTE  [name] SMO:[id number] DOB: 1950-11-07 DOA: 03/16/2015 PCP: Philis Fendt, MD  HPI: [name] is a 64 y.o. female With a history of CHF with no recent echocardiogram in the chart, GERD, irritable bowel syndrome, hypertension. The patient presents emergency department with back pain that started 4 months ago has gradually become worse.   Subjective / 24 H Interval events - complains of back pain and new onset abdominal pain, nausea and vomiting since last night - no chest pain, shortness of breath  Assessment/Plan: Active Problems:   CHF (congestive heart failure)   Morbid obesity   DJD (degenerative joint disease), lumbar   Essential hypertension   GERD (gastroesophageal reflux disease)   HLD (hyperlipidemia)   AKI (acute kidney injury)   Abdominal pain with nausea and vomiting - new onset, will obtain CMP, CBC, plain abdominal films, lipase - no diarrhea, endorses constipation, senokot - decrease diet to clear liquid  DJD, lumbar - MRI  without evidence of diskitis or osteomyelitis - cancelled IR aspiration as likelyhood of infection minimal, she has normal Sed Rate and mildly elevated CRP. - discussed with Dr. Ronnald Ramp from neurosurgery on 7/29, continue conservative management with oxycodone/muscle relaxer and prednisone. Discussed with IR as well on 7/29 and they don't do epidural or facet blocks and recommended me to discuss with fluoro radiology. Fluoro radiology does not do those procedures inpatient and recommended outpatient referral at the spine center [phone]. Will order injections as outpatient on d/c  - ordered a Quickdraw brace - patient will have follow up with Dr. Ronnald Ramp as an outpatient  AKI - suspect mild underlying CKD - IVF today for worsening Cr and poor po intake in the setting of nausea/vomiting  Chronic CHF, likely diastolic - no echos in the system, likely diastolic - continue Lasix  Morbid obesity - counseled for  weight loss  HTN - continue home medications  GERD - continue PPI  HLD - continue statin   Diet: Diet clear liquid Room service appropriate?: Yes; Fluid consistency:: Thin Fluids: none  DVT Prophylaxis: lovenox  Code Status: Full Code Family Communication: no family bedside Disposition Plan: home when ready   Consultants:  Neurosurgery   IR  ID  Procedures:  None    Antibiotics  Anti-infectives    None      Studies  Mr Lumbar Spine W Wo Contrast  03/17/2015   CLINICAL DATA:  Gradually worsening back pain for 6 months, 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 Pain radiating to buttock for several weeks.  EXAM: MRI LUMBAR SPINE WITHOUT AND WITH CONTRAST  TECHNIQUE: Multiplanar and multiecho pulse sequences of the lumbar spine were obtained without and with intravenous contrast.  CONTRAST:  61mL MULTIHANCE GADOBENATE DIMEGLUMINE 529 MG/ML IV SOLN  COMPARISON:  MRI of the lumbar spine February 06, 2015  FINDINGS: Mild motion degraded examination. Using the reference level of the last well-formed intervertebral disc as L5-S1, minimal grade 1 L4-5 anterolisthesis (2 mm), similar to prior examination. No spondylolysis. Linear enhancing T2 bright signal along the ventral and dorsal aspect the L4-5 disc without central disc enhancement no paraspinal fluid collections. Geographic low T1, bright T2 enhancing discogenic endplate changes F7-5, progressed from prior examination. Enhancing bright T1 and bright T2 sacral hemangioma. No suspicious osseous enhancement.  Mild L2-3, moderate L5-S1 disc height loss, similar to prior examination with decreased T2 signal within the lumbar disc consistent with mild desiccation, and slight edema within the L5-S1 disc, progressed from prior examination.  Moderate acute on chronic discogenic endplate changes W0-J8. Mild acute discogenic endplate changes J1-9, J4-7.  Conus medullaris terminates at L1-2 and appears normal morphology and signal  characteristics. Included view of the thoracic spine demonstrates ligamentum flavum redundancy most apparent at T10-11 resulting in at least moderate canal stenosis at this level, moderate canal stenosis T11-12 due to small disc protrusion, and prominent epidural fat. Cauda equina is normal in appearance. No abnormal cord, leptomeningeal or epidural enhancement. T2 bright nonenhancing cyst RIGHT kidney. Mild symmetric paraspinal muscle atrophy.  Level by level evaluation:  T12-L1: Annular bulging, moderate RIGHT and mild LEFT facet arthropathy without canal stenosis or neural foraminal narrowing.  L1-2: Small LEFT extra foraminal disc protrusion. Moderate facet arthropathy with ligamentum flavum redundancy. No canal stenosis or neural foraminal narrowing.  L2-3: Stable 3 mm broad-based disc bulge eccentric laterally which could affect the exited L2 nerves, similar. Severe facet arthropathy and ligamentum flavum redundancy with trace facet effusions which are likely reactive. Moderate canal stenosis. Moderate LEFT greater than RIGHT neural foraminal narrowing.  L3-4: Small broad-based disc bulge eccentric laterally could affect the exited LEFT greater than RIGHT L3 nerves, similar. Severe facet arthropathy and ligamentum flavum redundancy. Mild canal stenosis. Mild RIGHT, moderate LEFT neural foraminal narrowing.  L4-5: Anterolisthesis. Small broad-based disc bulge eccentric laterally. Severe facet arthropathy and ligamentum flavum redundancy. Mild canal stenosis. Moderate bilateral neural foraminal narrowing.  L5-S1: Small broad-based disc bulge eccentric to the LEFT may affect the exited LEFT L5 nerve. Moderate facet arthropathy without canal stenosis. Mild to moderate bilateral neural foraminal narrowing.  IMPRESSION: Mild abnormal enhancement L4-5 though, and slightly progressed bone marrow edema at L4-5, findings favor degenerative change considering slow progression, less likely discitis without MR findings of  osteomyelitis though, recommend correlation with CRP/ESR.  Moderate canal stenosis L2-3, mild at L3-4 and L4-5.  Neural foraminal narrowing L2-3 through L5-S1: Moderate at L2-3 through L4-5.   Electronically Signed   By: Elon Alas M.D.   On: 03/17/2015 00:38   US Renal  03/17/2015   CLINICAL DATA:  Right renal lesion on MRI  EXAM: RENAL / URINARY TRACT ULTRASOUND COMPLETE  COMPARISON:  01/2015 lumbar MRI  FINDINGS: Right Kidney:  Length: 12.2 cm. 3 cm benign simple cyst of the right midpole region. No hydronephrosis.  Left Kidney:  Length: 8.6 cm, probably not accurately measured. Limited view due to patient body habitus. Grossly negative for hydronephrosis.  Bladder:  Appears normal for degree of bladder distention.  IMPRESSION: There is a benign 3 cm cyst of the right renal midpole which accounts for the MR abnormality. No hydronephrosis. Limited views of the left kidney.   Electronically Signed   By: Andreas Newport M.D.   On: 03/17/2015 02:58   Objective  Filed Vitals:   03/17/15 1358 03/17/15 1400 03/17/15 2054 03/18/15 0435  BP: 114/60  143/72 119/62  Pulse: 59  59 60  Temp: 97.9 F (36.6 C)  98.3 F (36.8 C) 97.8 F (36.6 C)  TempSrc: Oral  Oral Oral  Resp:   19 19  Height:  $Remove'5\' 3"'CEiAaoc$  (1.6 m)    SpO2: 100%  100% 100%    Intake/Output Summary (Last 24 hours) at 03/18/15 1311 Last data filed at 03/18/15 1100  Gross per 24 hour  Intake   1320 ml  Output    600 ml  Net    720 ml   Exam:  General:  NAD, sitting in chair  HEENT: no scleral icterus  Cardiovascular: RRR without  MRG, 2+ peripheral pulses, no edema  Respiratory: CTA biL, good air movement, no wheezing, no crackles, no rales, difficult exam due to body habitus  Abdomen: soft, non tender, BS +, no guarding  MSK/Extremities: no clubbing/cyanosis, no joint swelling; out of proportion tenderness throughout T and L spine vertebrae   Skin: no rashes  Neuro: non focal, strength 5/5 in all 4   Data  Reviewed: Basic Metabolic Panel:  Recent Labs Lab 03/16/15 1120 03/17/15 0339 03/18/15 1000  NA 141 139 138  K 4.5 4.3 4.5  CL 108 104 99*  CO2 $Re'24 30 29  'tCM$ GLUCOSE 114* 101* 128*  BUN $Re'10 10 19  'nUZ$ CREATININE 1.15* 1.29* 1.46*  CALCIUM 9.1 8.9 9.5   Liver Function Tests:  Recent Labs Lab 03/16/15 1120 03/18/15 1000  AST 15 18  ALT 11* 15  ALKPHOS 53 67  BILITOT 0.6 0.5  PROT 6.9 7.7  ALBUMIN 3.5 3.8   CBC:  Recent Labs Lab 03/16/15 1120 03/17/15 0339 03/18/15 1000  WBC 3.6* 5.2 4.7  NEUTROABS 1.1*  --   --   HGB 14.3 13.2 15.0  HCT 44.0 41.0 46.0  MCV 87.1 88.4 87.5  PLT 177 180 214   Scheduled Meds: . baclofen  5 mg Oral TID  . citalopram  40 mg Oral Daily  . cloNIDine  0.3 mg Transdermal Weekly  . enoxaparin (LOVENOX) injection  40 mg Subcutaneous Q24H  . furosemide  80 mg Oral Daily  . losartan  100 mg Oral Daily   And  . hydrochlorothiazide  12.5 mg Oral Daily  . pantoprazole  40 mg Oral Daily  . potassium chloride SA  20 mEq Oral BID  . predniSONE  40 mg Oral Q breakfast  . simvastatin  20 mg Oral QPM   Continuous Infusions: . sodium chloride      Marzetta Board, MD [facility] Pager [phone]. If 7 PM - 7 AM, please contact night-coverage at [email or web address], password [facility] 03/18/2015, 1:11 PM  LOS: 2 days

## 2015-03-19 ENCOUNTER — Inpatient Hospital Stay (HOSPITAL_COMMUNITY): Payer: Medicare Other

## 2015-03-19 DIAGNOSIS — M47896 Other spondylosis, lumbar region: Secondary | ICD-10-CM | POA: Diagnosis not present

## 2015-03-19 DIAGNOSIS — M4806 Spinal stenosis, lumbar region: Secondary | ICD-10-CM | POA: Diagnosis not present

## 2015-03-19 DIAGNOSIS — M549 Dorsalgia, unspecified: Secondary | ICD-10-CM | POA: Diagnosis not present

## 2015-03-19 DIAGNOSIS — N179 Acute kidney failure, unspecified: Secondary | ICD-10-CM | POA: Diagnosis not present

## 2015-03-19 DIAGNOSIS — M4316 Spondylolisthesis, lumbar region: Secondary | ICD-10-CM | POA: Diagnosis not present

## 2015-03-19 DIAGNOSIS — R112 Nausea with vomiting, unspecified: Secondary | ICD-10-CM | POA: Diagnosis not present

## 2015-03-19 DIAGNOSIS — Z6841 Body Mass Index (BMI) 40.0 and over, adult: Secondary | ICD-10-CM | POA: Diagnosis not present

## 2015-03-19 DIAGNOSIS — K5730 Diverticulosis of large intestine without perforation or abscess without bleeding: Secondary | ICD-10-CM | POA: Diagnosis not present

## 2015-03-19 DIAGNOSIS — I5032 Chronic diastolic (congestive) heart failure: Secondary | ICD-10-CM | POA: Diagnosis not present

## 2015-03-19 LAB — BASIC METABOLIC PANEL
ANION GAP: 11 (ref 5–15)
BUN: 18 mg/dL (ref 6–20)
CO2: 25 mmol/L (ref 22–32)
Calcium: 8.6 mg/dL — ABNORMAL LOW (ref 8.9–10.3)
Chloride: 103 mmol/L (ref 101–111)
Creatinine, Ser: 1.37 mg/dL — ABNORMAL HIGH (ref 0.44–1.00)
GFR calc Af Amer: 46 mL/min — ABNORMAL LOW (ref >60)
GFR, EST NON AFRICAN AMERICAN: 40 mL/min — AB (ref >60)
Glucose, Bld: 101 mg/dL — ABNORMAL HIGH (ref 65–99)
POTASSIUM: 3.6 mmol/L (ref 3.5–5.1)
SODIUM: 139 mmol/L (ref 135–145)

## 2015-03-19 LAB — CBC
HCT: 40.8 % (ref 36.0–46.0)
Hemoglobin: 13.2 g/dL (ref 12.0–15.0)
MCH: 28.6 pg (ref 26.0–34.0)
MCHC: 32.4 g/dL (ref 30.0–36.0)
MCV: 88.3 fL (ref 78.0–100.0)
PLATELETS: 178 10*3/uL (ref 150–400)
RBC: 4.62 MIL/uL (ref 3.87–5.11)
RDW: 13.5 % (ref 11.5–15.5)
WBC: 7.4 10*3/uL (ref 4.0–10.5)

## 2015-03-19 MED ORDER — SENNOSIDES-DOCUSATE SODIUM 8.6-50 MG PO TABS
2.0000 | ORAL_TABLET | Freq: Once | ORAL | Status: AC
  Administered 2015-03-19: 2 via ORAL
  Filled 2015-03-19: qty 2

## 2015-03-19 MED ORDER — GLYCERIN (LAXATIVE) 2.1 G RE SUPP
1.0000 | Freq: Every day | RECTAL | Status: DC | PRN
  Filled 2015-03-19: qty 1

## 2015-03-19 NOTE — Progress Notes (Signed)
PROGRESS NOTE  [name] PJK:[id number] DOB: [birth year] DOA: 03/16/2015 PCP: Philis Fendt, MD  HPI: [name] is a 64 y.o. female With a history of CHF with no recent echocardiogram in the chart, GERD, irritable bowel syndrome, hypertension. The patient presents emergency department with back pain that started 4 months ago has gradually become worse.   Subjective / 24 H Interval events - Her back pain is about the same, she has had a couple episodes of vomiting overnight - Overall she tells me she is feeling a little bit better this morning  Assessment/Plan: Active Problems:   CHF (congestive heart failure)   Morbid obesity   DJD (degenerative joint disease), lumbar   Essential hypertension   GERD (gastroesophageal reflux disease)   HLD (hyperlipidemia)   AKI (acute kidney injury)   Abdominal pain with nausea and vomiting - new onset, CMP, CBC, plain abdominal films obtained yesterday, lipase all unremarkable - no diarrhea, endorses constipation, senokot - She wants to eat more today, advanced diet as tolerated - She still continues to have significant abdominal pain, obtain a CT scan of the abdomen and pelvis  DJD, lumbar - MRI  without evidence of diskitis or osteomyelitis - cancelled IR aspiration as likelyhood of infection minimal, she has normal Sed Rate and mildly elevated CRP. - discussed with Dr. Ronnald Ramp from neurosurgery on 7/29, continue conservative management with oxycodone/muscle relaxer and prednisone. Discussed with IR as well on 7/29 and they don't do epidural or facet blocks and recommended me to discuss with fluoro radiology. Fluoro radiology does not do those procedures inpatient and recommended outpatient referral at the spine center [phone]. Will order injections as outpatient on d/c  - ordered a Quickdraw brace - patient will have follow up with Dr. Ronnald Ramp as an outpatient - Continues to have pain, however overall this is stable  AKI -  suspect mild underlying CKD - Creatinine has remained stable  Chronic CHF, likely diastolic - no echos in the system, likely diastolic  Morbid obesity - counseled for weight loss  HTN - continue home medications  GERD - continue PPI  HLD - continue statin   Diet: Diet regular Room service appropriate?: Yes; Fluid consistency:: Thin Fluids: none  DVT Prophylaxis: lovenox  Code Status: Full Code Family Communication: no family bedside Disposition Plan: home when ready   Consultants:  Neurosurgery   IR  ID  Procedures:  None    Antibiotics  Anti-infectives    None      Studies  Ct Abdomen Pelvis Wo Contrast  03/19/2015   CLINICAL DATA:  Generalized abdominal pain for 3 months. Acute onset nausea and vomiting last night. Low back pain radiating to left groin.  EXAM: CT ABDOMEN AND PELVIS WITHOUT CONTRAST  TECHNIQUE: Multidetector CT imaging of the abdomen and pelvis was performed following the standard protocol without IV contrast.  COMPARISON:  12/20/2009  FINDINGS: Lower chest: No acute findings.  Hepatobiliary:  No mass visualized on this unenhanced exam.  Pancreas: No mass or inflammatory process visualized on this unenhanced exam.  Spleen:  Within normal limits in size.  Adrenal Glands:  No masses identified.  Kidneys/Urinary tract: No evidence of urolithiasis or hydronephrosis. Fluid attenuation right renal cyst remains stable. Mild chronic left renal atrophy is also unchanged.  Stomach/Bowel/Peritoneum: Diverticulosis is again seen involving descending and sigmoid colon. There is no evidence of diverticulitis or other inflammatory process or abnormal fluid collections.  Vascular/Lymphatic: No pathologically enlarged lymph nodes identified. No abdominal aortic aneurysm  or other significant retroperitoneal abnormality demonstrated.  Reproductive: Prior hysterectomy noted. Adnexal regions are unremarkable in appearance.  Other: Surgical mesh again seen within the  anterior abdominal wall. No evidence of recurrent hernia.  Musculoskeletal: No suspicious bone lesions identified. Degenerative lumbar spondylosis again noted.  IMPRESSION: Colonic diverticulosis. No radiographic evidence of diverticulitis or other acute findings within the abdomen or pelvis.   Electronically Signed   By: Earle Gell M.D.   On: 03/19/2015 12:48   Dg Abd 2 Views  03/18/2015   CLINICAL DATA:  Lower abdominal pain x3 months  EXAM: ABDOMEN - 2 VIEW  COMPARISON:  CT abdomen pelvis dated 12/20/2009  FINDINGS: Nonobstructive bowel gas pattern.  Overlying ventral hernia mesh repair.  Surgical clips in the left pelvis.  Degenerative changes of the lumbar spine.  IMPRESSION: No evidence of bowel obstruction.   Electronically Signed   By: Julian Hy M.D.   On: 03/18/2015 14:42   Objective  Filed Vitals:   03/18/15 0435 03/18/15 1408 03/18/15 2141 03/19/15 0536  BP: 119/62 131/71 104/75 138/64  Pulse: 60 58 64 58  Temp: 97.8 F (36.6 C) 97.7 F (36.5 C) 97.8 F (36.6 C) 97.7 F (36.5 C)  TempSrc: Oral Oral Oral   Resp: 19 18 17 16   Height:      SpO2: 100% 95% 99% 100%    Intake/Output Summary (Last 24 hours) at 03/19/15 1357 Last data filed at 03/19/15 0200  Gross per 24 hour  Intake  952.5 ml  Output    200 ml  Net  752.5 ml   Exam:  General:  NAD, sitting in chair  HEENT: no scleral icterus  Cardiovascular: RRR without MRG, 2+ peripheral pulses, no edema  Respiratory: CTA biL, good air movement, no wheezing, no crackles, no rales, difficult exam due to body habitus  Abdomen: soft, tender throughout, BS +, no guarding  MSK/Extremities: no clubbing/cyanosis, no joint swelling Data Reviewed: Basic Metabolic Panel:  Recent Labs Lab 03/16/15 1120 03/17/15 0339 03/18/15 1000 03/19/15 0407  NA 141 139 138 139  K 4.5 4.3 4.5 3.6  CL 108 104 99* 103  CO2 24 30 29 25   GLUCOSE 114* 101* 128* 101*  BUN 10 10 19 18   CREATININE 1.15* 1.29* 1.46* 1.37*    CALCIUM 9.1 8.9 9.5 8.6*   Liver Function Tests:  Recent Labs Lab 03/16/15 1120 03/18/15 1000  AST 15 18  ALT 11* 15  ALKPHOS 53 67  BILITOT 0.6 0.5  PROT 6.9 7.7  ALBUMIN 3.5 3.8   CBC:  Recent Labs Lab 03/16/15 1120 03/17/15 0339 03/18/15 1000 03/19/15 0407  WBC 3.6* 5.2 4.7 7.4  NEUTROABS 1.1*  --   --   --   HGB 14.3 13.2 15.0 13.2  HCT 44.0 41.0 46.0 40.8  MCV 87.1 88.4 87.5 88.3  PLT 177 180 214 178   Scheduled Meds: . citalopram  40 mg Oral Daily  . cloNIDine  0.3 mg Transdermal Weekly  . enoxaparin (LOVENOX) injection  40 mg Subcutaneous Q24H  . feeding supplement  1 Container Oral BID BM  . losartan  100 mg Oral Daily   And  . hydrochlorothiazide  12.5 mg Oral Daily  . pantoprazole  40 mg Oral Daily  . potassium chloride SA  20 mEq Oral BID  . simvastatin  20 mg Oral QPM   Continuous Infusions: . sodium chloride 75 mL/hr at 03/19/15 [place], MD [facility] Pager [phone]. If 7 PM -  7 AM, please contact night-coverage at [email or web address], password [facility] 03/19/2015, 1:57 PM  LOS: 3 days

## 2015-03-20 DIAGNOSIS — R112 Nausea with vomiting, unspecified: Secondary | ICD-10-CM | POA: Diagnosis not present

## 2015-03-20 DIAGNOSIS — M47816 Spondylosis without myelopathy or radiculopathy, lumbar region: Secondary | ICD-10-CM | POA: Diagnosis not present

## 2015-03-20 MED ORDER — PREDNISONE 20 MG PO TABS: 40.0000 mg | ORAL_TABLET | Freq: Every day | ORAL | Status: DC

## 2015-03-20 MED ORDER — OXYCODONE HCL 10 MG PO TABS: 10.0000 mg | ORAL_TABLET | ORAL | Status: DC | PRN

## 2015-03-20 NOTE — Progress Notes (Signed)
D/C paperwork given to patient. Prescriptions given to patient. IV removed. No questions verbalized. NA to push patient down to wait for ride.

## 2015-03-20 NOTE — Care Management Note (Signed)
Case Management Note  Patient Details  Name: [name] MRN: [id number] Date of Birth: 1951/07/31  Subjective/Objective:                    Action/Plan:  Confirmed face sheet information with patient and visitor . Explained she does not have an qualifying DX for Medicaid to cover home health PT. Patient states she also has Medicare .   Have asked MD to add home health RN for CHF management .   Expected Discharge Date:                  Expected Discharge Plan:  [place]  In-House Referral:     Discharge planning Services  CM Consult  Post Acute Care Choice:  Home Health Choice offered to:  Patient  DME Arranged:    DME Agency:     HH Arranged:  RN, PT, Nurse's Aide [place] Agency:  [place]  Status of Service:  Completed, signed off  Medicare Important Message Given:    Date Medicare IM Given:    Medicare IM give by:    Date Additional Medicare IM Given:    Additional Medicare Important Message give by:     If discussed at [place] of Stay Meetings, dates discussed:    Additional Comments:  Marilu Favre, RN 03/20/2015, 10:18 AM

## 2015-03-20 NOTE — Discharge Summary (Signed)
Physician Discharge Summary  [name]:[id number] DOB: October 09, 1950 DOA: 03/16/2015  PCP: Philis Fendt, MD  Admit date: 03/16/2015 Discharge date: 03/20/2015  Time spent: > 35 minutes  Recommendations for Outpatient Follow-up:  1. Follow up with Dr. Ronnald Ramp in 2 weeks 2. [facility]   Discharge Diagnoses:  Active Problems:   CHF (congestive heart failure)   Morbid obesity   DJD (degenerative joint disease), lumbar   Essential hypertension   GERD (gastroesophageal reflux disease)   HLD (hyperlipidemia)   AKI (acute kidney injury)  Discharge Condition: stable  Diet recommendation: regular  History of present illness:  [name] is a 64 y.o. Female With a history of CHF with no recent echocardiogram in the chart, GERD, irritable bowel syndrome, hypertension. The patient presents emergency department with back pain that started 4 months ago has gradually become worse. The pain is described as constant, worse with movements and improved with rest. Her pain is worsened with prolonged standing or ambulation. When she does state she tends to lean forward to help ease the pain. She had a MRI of her lumbar spine in 02/06/2015 which showed L4-L5 discitis with potential osteomyelitis to the adjacent vertebral bodies. The patient was sent to infectious disease. A lumbar spine x-ray was obtained approximately 7 days ago which showed no obvious destructive changes are obvious demineralization around the L4-L5 disc space that would be suggestive of osteomyelitis. Attends were made to obtain a aspiration of the disc for cultures, however this was not able to be obtained. Due to increasing pain and difficulty ventilating, the patient presents to the emergency department. Her pain radiates into her buttock which is new over the past several weeks.  Hospital Course:  DJD, lumbar - patient was initially admitted to the hospital with concerns for  discitis/osteomyelitis while previously being followed by infectious disease with plans to have a biopsy done by interventional radiology, , she underwent an MRI which actually showed no evidence of discitis or osteomyelitis. Her sedimentation rate was normal, she had mild elevated CRP, interventional radiology discussed with infectious disease And canceled aspiration procedure as this is unlikely to be infection. She continued to have severe back pain, and neurosurgery was consulted for further evaluation. Dr. Ronnald Ramp evaluated patient 7/29 and recommended to continue conservative management outpatient back injections, and he will follow up with her as an outpatient as he thinks she might eventually need surgery. Patient was placed on steroid, pain medications and muscle relaxers, with some improvement in her back pain however not fully controlled. I discussed with interventional radiology as well as floor radiology regarding epidural or facet blocks, these are not done as an inpatient. I was able to contact the spine center at [phone], and have ordered, per neurosurgery recommendation, epidural for L2-3 and facet block for L4-5. She was also given a Quickdraw brace. Physical therapy was consulted and have evaluated patient while hospitalized, recommended home health physical therapy which was arranged prior to patient's discharge. She was discharged home in stable condition with close outpatient follow-up both in spine clinic as well as neurosurgery. Abdominal pain with nausea and vomiting - on hospital day 2 on 7/13, patient had complaining of new onset of abdominal pain with nausea and vomiting, blood work was fairly unremarkable as well as lipase was normal, given her history of hernia and mesh placement she underwent a CT scan of the abdomen and pelvis which was negative for acute intra-abdominal findings. Her nausea  and vomiting have resolved on their own and she was able to tolerate a regular diet  without any further complaints. AKI - suspect mild underlying CKD, Creatinine has remained stable Chronic CHF, likely diastolic - no echos in the system, likely diastolic Morbid obesity - counseled for weight loss. She will need to lose a significant amount of weight for surgery HTN - continue home medications GERD - continue PPI HLD- continue statin  Procedures:  None    Consultations:  Infectious disease  IR  Neurosurgery   Discharge Exam: Filed Vitals:   03/18/15 2141 03/19/15 0536 03/19/15 1304 03/19/15 2028  BP: 104/75 138/64 128/76 113/54  Pulse: 64 58 58 60  Temp: 97.8 F (36.6 C) 97.7 F (36.5 C) 98.5 F (36.9 C) 98.1 F (36.7 C)  TempSrc: Oral  Oral Oral  Resp: _0 Height:      SpO2: 99% 100% 100% 100%   General: NAD Cardiovascular: RRR Respiratory: CTA biL  Discharge Instructions     Medication List    STOP taking these medications        HYDROcodone-acetaminophen 7.5-325 MG per tablet  Commonly known as:  NORCO     methocarbamol 500 MG tablet  Commonly known as:  ROBAXIN     oxyCODONE-acetaminophen 5-325 MG per tablet  Commonly known as:  PERCOCET/ROXICET     warfarin 5 MG tablet  Commonly known as:  COUMADIN      TAKE these medications        albuterol 108 (90 BASE) MCG/ACT inhaler  Commonly known as:  PROVENTIL HFA;VENTOLIN HFA  Inhale 2 puffs into the lungs every 6 (six) hours as needed for wheezing.     albuterol (2.5 MG/3ML) 0.083% nebulizer solution  Commonly known as:  PROVENTIL  Take 2.5 mg by nebulization every 6 (six) hours as needed for wheezing.     citalopram 40 MG tablet  Commonly known as:  CELEXA  Take 40 mg by mouth daily.     cloNIDine 0.3 mg/24hr patch  Commonly known as:  CATAPRES - Dosed in mg/24 hr  Place 0.3 mg onto the skin once a week.     diphenoxylate-atropine 2.5-0.025 MG per tablet  Commonly known as:  LOMOTIL  Take 1 tablet by mouth daily as needed. Upset stomach     furosemide 80 MG  tablet  Commonly known as:  LASIX  Take 80 mg by mouth daily.     losartan-hydrochlorothiazide 100-12.5 MG per tablet  Commonly known as:  HYZAAR  Take 1 tablet by mouth daily.     omeprazole 20 MG capsule  Commonly known as:  PRILOSEC  Take 20 mg by mouth 2 (two) times daily.     Oxycodone HCl 10 MG Tabs  Take 1 tablet (10 mg total) by mouth every 4 (four) hours as needed for moderate pain.     potassium chloride SA 20 MEQ tablet  Commonly known as:  K-DUR,KLOR-CON  Take 20 mEq by mouth 2 (two) times daily.     predniSONE 20 MG tablet  Commonly known as:  DELTASONE  Take 2 tablets (40 mg total) by mouth daily. For 3 days then 1 tablet daily for 3 days then 1/2 tablet daily for 4 days     simvastatin 20 MG tablet  Commonly known as:  ZOCOR  Take 20 mg by mouth every evening.     Vitamin D (Ergocalciferol) 50000 UNITS Caps capsule  Commonly known as:  DRISDOL  Take 50,000 Units  by mouth every 7 (seven) days. On Wednesday           Follow-up Information    Follow up with JONES,DAVID C. Schedule an appointment as soon as possible for a visit in 2 weeks.   Specialty:  Unknown Physician Specialty      The results of significant diagnostics from this hospitalization (including imaging, microbiology, ancillary and laboratory) are listed below for reference.    Significant Diagnostic Studies: Ct Abdomen Pelvis Wo Contrast  03/19/2015   CLINICAL DATA:  Generalized abdominal pain for 3 months. Acute onset nausea and vomiting last night. Low back pain radiating to left groin.  EXAM: CT ABDOMEN AND PELVIS WITHOUT CONTRAST  TECHNIQUE: Multidetector CT imaging of the abdomen and pelvis was performed following the standard protocol without IV contrast.  COMPARISON:  12/20/2009  FINDINGS: Lower chest: No acute findings.  Hepatobiliary:  No mass visualized on this unenhanced exam.  Pancreas: No mass or inflammatory process visualized on this unenhanced exam.  Spleen:  Within normal limits  in size.  Adrenal Glands:  No masses identified.  Kidneys/Urinary tract: No evidence of urolithiasis or hydronephrosis. Fluid attenuation right renal cyst remains stable. Mild chronic left renal atrophy is also unchanged.  Stomach/Bowel/Peritoneum: Diverticulosis is again seen involving descending and sigmoid colon. There is no evidence of diverticulitis or other inflammatory process or abnormal fluid collections.  Vascular/Lymphatic: No pathologically enlarged lymph nodes identified. No abdominal aortic aneurysm or other significant retroperitoneal abnormality demonstrated.  Reproductive: Prior hysterectomy noted. Adnexal regions are unremarkable in appearance.  Other: Surgical mesh again seen within the anterior abdominal wall. No evidence of recurrent hernia.  Musculoskeletal: No suspicious bone lesions identified. Degenerative lumbar spondylosis again noted.  IMPRESSION: Colonic diverticulosis. No radiographic evidence of diverticulitis or other acute findings within the abdomen or pelvis.   Electronically Signed   By: Earle Gell M.D.   On: 03/19/2015 12:48   Dg Lumbar Spine 2-3 Views  03/09/2015   CLINICAL DATA:  Follow-up films from MRI findings. Lumbar spine abnormalities.  EXAM: LUMBAR SPINE - 2-3 VIEW  COMPARISON:  Lumbar spine MRI dated 02/06/2015.  FINDINGS: Degenerative changes are seen throughout the lumbar spine, moderate in degree with disc space narrowings and osseous spurring at multiple levels. The degenerative changes are most prominent at the L4-5 and L5-S1 levels, as also indicated on MRI report. Slight anterolisthesis of L4 on L5 is likely related to the underlying degenerative changes at this level.  No acute- appearing cortical irregularity or osseous lesion seen. There are no destructive changes seen at the L4 or L5 levels to confirm osteomyelitis/discitis. No fracture line or displaced fracture fragment. The immediate paravertebral soft tissues are unremarkable.  IMPRESSION: No  destructive changes or obvious demineralization about the L4-5 disc space to confirm an osteomyelitis/discitis, as suggested on the recent MRI report.  Multilevel degenerative changes as detailed above, and as also described in detail on the earlier MRI report.   Electronically Signed   By: Franki Cabot M.D.   On: 03/09/2015 13:52   Mr Lumbar Spine W Wo Contrast  03/17/2015   CLINICAL DATA:  Gradually worsening back pain for 6 months, 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 Pain radiating to buttock for several weeks.  EXAM: MRI LUMBAR SPINE WITHOUT AND WITH CONTRAST  TECHNIQUE: Multiplanar and multiecho pulse sequences of the lumbar spine were obtained without and with intravenous contrast.  CONTRAST:  51m MULTIHANCE GADOBENATE DIMEGLUMINE 529 MG/ML IV SOLN  COMPARISON:  MRI of the lumbar  spine February 06, 2015  FINDINGS: Mild motion degraded examination. Using the reference level of the last well-formed intervertebral disc as L5-S1, minimal grade 1 L4-5 anterolisthesis (2 mm), similar to prior examination. No spondylolysis. Linear enhancing T2 bright signal along the ventral and dorsal aspect the L4-5 disc without central disc enhancement no paraspinal fluid collections. Geographic low T1, bright T2 enhancing discogenic endplate changes M5-7, progressed from prior examination. Enhancing bright T1 and bright T2 sacral hemangioma. No suspicious osseous enhancement.  Mild L2-3, moderate L5-S1 disc height loss, similar to prior examination with decreased T2 signal within the lumbar disc consistent with mild desiccation, and slight edema within the L5-S1 disc, progressed from prior examination. Moderate acute on chronic discogenic endplate changes Q4-O9. Mild acute discogenic endplate changes G2-9, B2-8.  Conus medullaris terminates at L1-2 and appears normal morphology and signal characteristics. Included view of the thoracic spine demonstrates ligamentum flavum redundancy most apparent at T10-11  resulting in at least moderate canal stenosis at this level, moderate canal stenosis T11-12 due to small disc protrusion, and prominent epidural fat. Cauda equina is normal in appearance. No abnormal cord, leptomeningeal or epidural enhancement. T2 bright nonenhancing cyst RIGHT kidney. Mild symmetric paraspinal muscle atrophy.  Level by level evaluation:  T12-L1: Annular bulging, moderate RIGHT and mild LEFT facet arthropathy without canal stenosis or neural foraminal narrowing.  L1-2: Small LEFT extra foraminal disc protrusion. Moderate facet arthropathy with ligamentum flavum redundancy. No canal stenosis or neural foraminal narrowing.  L2-3: Stable 3 mm broad-based disc bulge eccentric laterally which could affect the exited L2 nerves, similar. Severe facet arthropathy and ligamentum flavum redundancy with trace facet effusions which are likely reactive. Moderate canal stenosis. Moderate LEFT greater than RIGHT neural foraminal narrowing.  L3-4: Small broad-based disc bulge eccentric laterally could affect the exited LEFT greater than RIGHT L3 nerves, similar. Severe facet arthropathy and ligamentum flavum redundancy. Mild canal stenosis. Mild RIGHT, moderate LEFT neural foraminal narrowing.  L4-5: Anterolisthesis. Small broad-based disc bulge eccentric laterally. Severe facet arthropathy and ligamentum flavum redundancy. Mild canal stenosis. Moderate bilateral neural foraminal narrowing.  L5-S1: Small broad-based disc bulge eccentric to the LEFT may affect the exited LEFT L5 nerve. Moderate facet arthropathy without canal stenosis. Mild to moderate bilateral neural foraminal narrowing.  IMPRESSION: Mild abnormal enhancement L4-5 though, and slightly progressed bone marrow edema at L4-5, findings favor degenerative change considering slow progression, less likely discitis without MR findings of osteomyelitis though, recommend correlation with CRP/ESR.  Moderate canal stenosis L2-3, mild at L3-4 and L4-5.  Neural  foraminal narrowing L2-3 through L5-S1: Moderate at L2-3 through L4-5.   Electronically Signed   By: Elon Alas M.D.   On: 03/17/2015 00:38   US Renal  03/17/2015   CLINICAL DATA:  Right renal lesion on MRI  EXAM: RENAL / URINARY TRACT ULTRASOUND COMPLETE  COMPARISON:  01/2015 lumbar MRI  FINDINGS: Right Kidney:  Length: 12.2 cm. 3 cm benign simple cyst of the right midpole region. No hydronephrosis.  Left Kidney:  Length: 8.6 cm, probably not accurately measured. Limited view due to patient body habitus. Grossly negative for hydronephrosis.  Bladder:  Appears normal for degree of bladder distention.  IMPRESSION: There is a benign 3 cm cyst of the right renal midpole which accounts for the MR abnormality. No hydronephrosis. Limited views of the left kidney.   Electronically Signed   By: Andreas Newport M.D.   On: 03/17/2015 02:58   Dg Abd 2 Views  03/18/2015   CLINICAL DATA:  Lower abdominal pain x3 months  EXAM: ABDOMEN - 2 VIEW  COMPARISON:  CT abdomen pelvis dated 12/20/2009  FINDINGS: Nonobstructive bowel gas pattern.  Overlying ventral hernia mesh repair.  Surgical clips in the left pelvis.  Degenerative changes of the lumbar spine.  IMPRESSION: No evidence of bowel obstruction.   Electronically Signed   By: Julian Hy M.D.   On: 03/18/2015 14:42   Labs: Basic Metabolic Panel:  Recent Labs Lab 03/16/15 1120 03/17/15 0339 03/18/15 1000 03/19/15 0407  NA 141 139 138 139  K 4.5 4.3 4.5 3.6  CL 108 104 99* 103  CO2 _0 GLUCOSE 114* 101* 128* 101*  BUN _1 CREATININE 1.15* 1.29* 1.46* 1.37*  CALCIUM 9.1 8.9 9.5 8.6*   Liver Function Tests:  Recent Labs Lab 03/16/15 1120 03/18/15 1000  AST 15 18  ALT 11* 15  ALKPHOS 53 67  BILITOT 0.6 0.5  PROT 6.9 7.7  ALBUMIN 3.5 3.8    Recent Labs Lab 03/18/15 1000  LIPASE 39   CBC:  Recent Labs Lab 03/16/15 1120 03/17/15 0339 03/18/15 1000 03/19/15 0407  WBC 3.6* 5.2 4.7 7.4  NEUTROABS 1.1*   --   --   --   HGB 14.3 13.2 15.0 13.2  HCT 44.0 41.0 46.0 40.8  MCV 87.1 88.4 87.5 88.3  PLT 177 180 214 178    Signed:  Dyllan Hughett  [facility] 03/20/2015, 3:00 PM

## 2015-03-20 NOTE — Discharge Instructions (Signed)
Follow with AVBUERE,EDWIN A, MD in 5-7 days  Follow up with Dr. Ronnald Ramp from Neurosurgery in 1-2 weeks  As soon as you are discharged please call [facility] to schedule a back injection to help with the pain. Their telephone number is [phone].  Please get a complete blood count and chemistry panel checked by your Primary MD at your next visit, and again as instructed by your Primary MD. Please get your medications reviewed and adjusted by your Primary MD.  Please request your Primary MD to go over all Hospital Tests and Procedure/Radiological results at the follow up, please get all Hospital records sent to your Prim MD by signing hospital release before you go home.  If you had Pneumonia of Lung problems at the Hospital: Please get a 2 view Chest X ray done in 6-8 weeks after hospital discharge or sooner if instructed by your Primary MD.  If you have Congestive Heart Failure: Please call your Cardiologist or Primary MD anytime you have any of the following symptoms:  1) 3 pound weight gain in 24 hours or 5 pounds in 1 week  2) shortness of breath, with or without a dry hacking cough  3) swelling in the hands, feet or stomach  4) if you have to sleep on extra pillows at night in order to breathe  Follow cardiac low salt diet and 1.5 lit/day fluid restriction.  If you have diabetes Accuchecks 4 times/day, Once in AM empty stomach and then before each meal. Log in all results and show them to your primary doctor at your next visit. If any glucose reading is under 80 or above 300 call your primary MD immediately.  If you have Seizure/Convulsions/Epilepsy: Please do not drive, operate heavy machinery, participate in activities at heights or participate in high speed sports until you have seen by Primary MD or a Neurologist and advised to do so again.  If you had Gastrointestinal Bleeding: Please ask your Primary MD to check a complete blood count within one week of discharge or at  your next visit. Your endoscopic/colonoscopic biopsies that are pending at the time of discharge, will also need to followed by your Primary MD.  Get Medicines reviewed and adjusted. Please take all your medications with you for your next visit with your Primary MD  Please request your Primary MD to go over all hospital tests and procedure/radiological results at the follow up, please ask your Primary MD to get all Hospital records sent to his/her office.  If you experience worsening of your admission symptoms, develop shortness of breath, life threatening emergency, suicidal or homicidal thoughts you must seek medical attention immediately by calling 911 or calling your MD immediately  if symptoms less severe.  You must read complete instructions/literature along with all the possible adverse reactions/side effects for all the Medicines you take and that have been prescribed to you. Take any new Medicines after you have completely understood and accpet all the possible adverse reactions/side effects.   Do not drive or operate heavy machinery when taking Pain medications.   Do not take more than prescribed Pain, Sleep and Anxiety Medications  Special Instructions: If you have smoked or chewed Tobacco  in the last 2 yrs please stop smoking, stop any regular Alcohol  and or any Recreational drug use.  Wear Seat belts while driving.  Please note You were cared for by a hospitalist during your hospital stay. If you have any questions about your discharge medications or the care you  received while you were in the hospital after you are discharged, you can call the unit and asked to speak with the hospitalist on call if the hospitalist that took care of you is not available. Once you are discharged, your primary care physician will handle any further medical issues. Please note that NO REFILLS for any discharge medications will be authorized once you are discharged, as it is imperative that you return  to your primary care physician (or establish a relationship with a primary care physician if you do not have one) for your aftercare needs so that they can reassess your need for medications and monitor your lab values.  You can reach the hospitalist office at phone [phone] or fax [phone]   If you do not have a primary care physician, you can call [phone] for a physician referral.  Activity: As tolerated with Full fall precautions use walker/cane & assistance as needed  Diet: heart healthy  Disposition Home with HHPT

## 2015-03-21 DIAGNOSIS — M4806 Spinal stenosis, lumbar region: Secondary | ICD-10-CM | POA: Diagnosis not present

## 2015-03-21 DIAGNOSIS — M4316 Spondylolisthesis, lumbar region: Secondary | ICD-10-CM | POA: Diagnosis not present

## 2015-03-21 DIAGNOSIS — I10 Essential (primary) hypertension: Secondary | ICD-10-CM | POA: Diagnosis not present

## 2015-03-21 DIAGNOSIS — K219 Gastro-esophageal reflux disease without esophagitis: Secondary | ICD-10-CM | POA: Diagnosis not present

## 2015-03-21 DIAGNOSIS — I509 Heart failure, unspecified: Secondary | ICD-10-CM | POA: Diagnosis not present

## 2015-03-21 DIAGNOSIS — M4646 Discitis, unspecified, lumbar region: Secondary | ICD-10-CM | POA: Diagnosis not present

## 2015-03-21 DIAGNOSIS — K589 Irritable bowel syndrome without diarrhea: Secondary | ICD-10-CM | POA: Diagnosis not present

## 2015-03-21 DIAGNOSIS — E669 Obesity, unspecified: Secondary | ICD-10-CM | POA: Diagnosis not present

## 2015-03-21 DIAGNOSIS — M170 Bilateral primary osteoarthritis of knee: Secondary | ICD-10-CM | POA: Diagnosis not present

## 2015-03-22 ENCOUNTER — Other Ambulatory Visit: Payer: Self-pay | Admitting: Internal Medicine

## 2015-03-22 ENCOUNTER — Ambulatory Visit
Admission: RE | Admit: 2015-03-22 | Discharge: 2015-03-22 | Disposition: A | Discharge status: Home / Self Care | Payer: Medicare Other | Source: Ambulatory Visit | Attending: Internal Medicine | Admitting: Internal Medicine

## 2015-03-22 DIAGNOSIS — M47816 Spondylosis without myelopathy or radiculopathy, lumbar region: Secondary | ICD-10-CM

## 2015-03-22 DIAGNOSIS — M79605 Pain in left leg: Principal | ICD-10-CM

## 2015-03-22 DIAGNOSIS — M545 Low back pain, unspecified: Secondary | ICD-10-CM

## 2015-03-22 MED ORDER — METHYLPREDNISOLONE ACETATE 40 MG/ML INJ SUSP (RADIOLOG: 120.0000 mg | Freq: Once | INTRAMUSCULAR | Status: DC

## 2015-03-22 MED ORDER — IOHEXOL 180 MG/ML  SOLN
1.0000 mL | Freq: Once | INTRAMUSCULAR | Status: AC | PRN
  Administered 2015-03-22: 1 mL via EPIDURAL

## 2015-03-23 DIAGNOSIS — M4806 Spinal stenosis, lumbar region: Secondary | ICD-10-CM | POA: Diagnosis not present

## 2015-03-23 DIAGNOSIS — M4646 Discitis, unspecified, lumbar region: Secondary | ICD-10-CM | POA: Diagnosis not present

## 2015-03-23 DIAGNOSIS — I509 Heart failure, unspecified: Secondary | ICD-10-CM | POA: Diagnosis not present

## 2015-03-23 DIAGNOSIS — M4316 Spondylolisthesis, lumbar region: Secondary | ICD-10-CM | POA: Diagnosis not present

## 2015-03-23 DIAGNOSIS — K589 Irritable bowel syndrome without diarrhea: Secondary | ICD-10-CM | POA: Diagnosis not present

## 2015-03-23 DIAGNOSIS — M170 Bilateral primary osteoarthritis of knee: Secondary | ICD-10-CM | POA: Diagnosis not present

## 2015-03-24 DIAGNOSIS — M4646 Discitis, unspecified, lumbar region: Secondary | ICD-10-CM | POA: Diagnosis not present

## 2015-03-24 DIAGNOSIS — I509 Heart failure, unspecified: Secondary | ICD-10-CM | POA: Diagnosis not present

## 2015-03-24 DIAGNOSIS — M4316 Spondylolisthesis, lumbar region: Secondary | ICD-10-CM | POA: Diagnosis not present

## 2015-03-24 DIAGNOSIS — M4806 Spinal stenosis, lumbar region: Secondary | ICD-10-CM | POA: Diagnosis not present

## 2015-03-24 DIAGNOSIS — M170 Bilateral primary osteoarthritis of knee: Secondary | ICD-10-CM | POA: Diagnosis not present

## 2015-03-24 DIAGNOSIS — K589 Irritable bowel syndrome without diarrhea: Secondary | ICD-10-CM | POA: Diagnosis not present

## 2015-03-27 DIAGNOSIS — M4316 Spondylolisthesis, lumbar region: Secondary | ICD-10-CM | POA: Diagnosis not present

## 2015-03-27 DIAGNOSIS — M170 Bilateral primary osteoarthritis of knee: Secondary | ICD-10-CM | POA: Diagnosis not present

## 2015-03-27 DIAGNOSIS — I509 Heart failure, unspecified: Secondary | ICD-10-CM | POA: Diagnosis not present

## 2015-03-27 DIAGNOSIS — K589 Irritable bowel syndrome without diarrhea: Secondary | ICD-10-CM | POA: Diagnosis not present

## 2015-03-27 DIAGNOSIS — M4806 Spinal stenosis, lumbar region: Secondary | ICD-10-CM | POA: Diagnosis not present

## 2015-03-27 DIAGNOSIS — M4646 Discitis, unspecified, lumbar region: Secondary | ICD-10-CM | POA: Diagnosis not present

## 2015-03-28 DIAGNOSIS — K589 Irritable bowel syndrome without diarrhea: Secondary | ICD-10-CM | POA: Diagnosis not present

## 2015-03-28 DIAGNOSIS — M4806 Spinal stenosis, lumbar region: Secondary | ICD-10-CM | POA: Diagnosis not present

## 2015-03-28 DIAGNOSIS — M170 Bilateral primary osteoarthritis of knee: Secondary | ICD-10-CM | POA: Diagnosis not present

## 2015-03-28 DIAGNOSIS — M4646 Discitis, unspecified, lumbar region: Secondary | ICD-10-CM | POA: Diagnosis not present

## 2015-03-28 DIAGNOSIS — M4316 Spondylolisthesis, lumbar region: Secondary | ICD-10-CM | POA: Diagnosis not present

## 2015-03-28 DIAGNOSIS — I509 Heart failure, unspecified: Secondary | ICD-10-CM | POA: Diagnosis not present

## 2015-03-29 ENCOUNTER — Ambulatory Visit
Admission: RE | Admit: 2015-03-29 | Discharge: 2015-03-29 | Disposition: A | Discharge status: Home / Self Care | Payer: Medicare Other | Source: Ambulatory Visit | Attending: Internal Medicine | Admitting: Internal Medicine

## 2015-03-29 DIAGNOSIS — M79605 Pain in left leg: Principal | ICD-10-CM

## 2015-03-29 DIAGNOSIS — M545 Low back pain, unspecified: Secondary | ICD-10-CM

## 2015-03-29 DIAGNOSIS — M5417 Radiculopathy, lumbosacral region: Secondary | ICD-10-CM | POA: Diagnosis not present

## 2015-03-29 MED ORDER — METHYLPREDNISOLONE ACETATE 40 MG/ML INJ SUSP (RADIOLOG
120.0000 mg | Freq: Once | INTRAMUSCULAR | Status: AC
  Administered 2015-03-29: 120 mg via EPIDURAL

## 2015-03-29 MED ORDER — IOHEXOL 180 MG/ML  SOLN
1.0000 mL | Freq: Once | INTRAMUSCULAR | Status: DC | PRN
  Administered 2015-03-29: 1 mL via EPIDURAL

## 2015-03-29 NOTE — Discharge Instructions (Signed)

## 2015-03-30 DIAGNOSIS — M4316 Spondylolisthesis, lumbar region: Secondary | ICD-10-CM | POA: Diagnosis not present

## 2015-03-30 DIAGNOSIS — M4806 Spinal stenosis, lumbar region: Secondary | ICD-10-CM | POA: Diagnosis not present

## 2015-03-30 DIAGNOSIS — K589 Irritable bowel syndrome without diarrhea: Secondary | ICD-10-CM | POA: Diagnosis not present

## 2015-03-30 DIAGNOSIS — I509 Heart failure, unspecified: Secondary | ICD-10-CM | POA: Diagnosis not present

## 2015-03-30 DIAGNOSIS — M170 Bilateral primary osteoarthritis of knee: Secondary | ICD-10-CM | POA: Diagnosis not present

## 2015-03-30 DIAGNOSIS — M4646 Discitis, unspecified, lumbar region: Secondary | ICD-10-CM | POA: Diagnosis not present

## 2015-03-31 DIAGNOSIS — M4806 Spinal stenosis, lumbar region: Secondary | ICD-10-CM | POA: Diagnosis not present

## 2015-03-31 DIAGNOSIS — I509 Heart failure, unspecified: Secondary | ICD-10-CM | POA: Diagnosis not present

## 2015-03-31 DIAGNOSIS — M4646 Discitis, unspecified, lumbar region: Secondary | ICD-10-CM | POA: Diagnosis not present

## 2015-03-31 DIAGNOSIS — M170 Bilateral primary osteoarthritis of knee: Secondary | ICD-10-CM | POA: Diagnosis not present

## 2015-03-31 DIAGNOSIS — K589 Irritable bowel syndrome without diarrhea: Secondary | ICD-10-CM | POA: Diagnosis not present

## 2015-03-31 DIAGNOSIS — M4316 Spondylolisthesis, lumbar region: Secondary | ICD-10-CM | POA: Diagnosis not present

## 2015-04-04 DIAGNOSIS — M4806 Spinal stenosis, lumbar region: Secondary | ICD-10-CM | POA: Diagnosis not present

## 2015-04-04 DIAGNOSIS — M4646 Discitis, unspecified, lumbar region: Secondary | ICD-10-CM | POA: Diagnosis not present

## 2015-04-04 DIAGNOSIS — M4316 Spondylolisthesis, lumbar region: Secondary | ICD-10-CM | POA: Diagnosis not present

## 2015-04-04 DIAGNOSIS — K589 Irritable bowel syndrome without diarrhea: Secondary | ICD-10-CM | POA: Diagnosis not present

## 2015-04-04 DIAGNOSIS — M170 Bilateral primary osteoarthritis of knee: Secondary | ICD-10-CM | POA: Diagnosis not present

## 2015-04-04 DIAGNOSIS — I509 Heart failure, unspecified: Secondary | ICD-10-CM | POA: Diagnosis not present

## 2015-04-05 DIAGNOSIS — I10 Essential (primary) hypertension: Secondary | ICD-10-CM | POA: Diagnosis not present

## 2015-04-05 DIAGNOSIS — I509 Heart failure, unspecified: Secondary | ICD-10-CM | POA: Diagnosis not present

## 2015-04-05 DIAGNOSIS — M170 Bilateral primary osteoarthritis of knee: Secondary | ICD-10-CM | POA: Diagnosis not present

## 2015-04-05 DIAGNOSIS — K589 Irritable bowel syndrome without diarrhea: Secondary | ICD-10-CM | POA: Diagnosis not present

## 2015-04-05 DIAGNOSIS — M545 Low back pain: Secondary | ICD-10-CM | POA: Diagnosis not present

## 2015-04-05 DIAGNOSIS — M4806 Spinal stenosis, lumbar region: Secondary | ICD-10-CM | POA: Diagnosis not present

## 2015-04-05 DIAGNOSIS — M4646 Discitis, unspecified, lumbar region: Secondary | ICD-10-CM | POA: Diagnosis not present

## 2015-04-05 DIAGNOSIS — G894 Chronic pain syndrome: Secondary | ICD-10-CM | POA: Diagnosis not present

## 2015-04-05 DIAGNOSIS — M4316 Spondylolisthesis, lumbar region: Secondary | ICD-10-CM | POA: Diagnosis not present

## 2015-04-05 DIAGNOSIS — K580 Irritable bowel syndrome with diarrhea: Secondary | ICD-10-CM | POA: Diagnosis not present

## 2015-04-10 DIAGNOSIS — M4316 Spondylolisthesis, lumbar region: Secondary | ICD-10-CM | POA: Diagnosis not present

## 2015-04-10 DIAGNOSIS — M4806 Spinal stenosis, lumbar region: Secondary | ICD-10-CM | POA: Diagnosis not present

## 2015-04-10 DIAGNOSIS — I509 Heart failure, unspecified: Secondary | ICD-10-CM | POA: Diagnosis not present

## 2015-04-10 DIAGNOSIS — K589 Irritable bowel syndrome without diarrhea: Secondary | ICD-10-CM | POA: Diagnosis not present

## 2015-04-10 DIAGNOSIS — M170 Bilateral primary osteoarthritis of knee: Secondary | ICD-10-CM | POA: Diagnosis not present

## 2015-04-10 DIAGNOSIS — M4646 Discitis, unspecified, lumbar region: Secondary | ICD-10-CM | POA: Diagnosis not present

## 2015-04-11 DIAGNOSIS — I509 Heart failure, unspecified: Secondary | ICD-10-CM | POA: Diagnosis not present

## 2015-04-11 DIAGNOSIS — M4316 Spondylolisthesis, lumbar region: Secondary | ICD-10-CM | POA: Diagnosis not present

## 2015-04-11 DIAGNOSIS — M4646 Discitis, unspecified, lumbar region: Secondary | ICD-10-CM | POA: Diagnosis not present

## 2015-04-11 DIAGNOSIS — M170 Bilateral primary osteoarthritis of knee: Secondary | ICD-10-CM | POA: Diagnosis not present

## 2015-04-11 DIAGNOSIS — M4806 Spinal stenosis, lumbar region: Secondary | ICD-10-CM | POA: Diagnosis not present

## 2015-04-11 DIAGNOSIS — K589 Irritable bowel syndrome without diarrhea: Secondary | ICD-10-CM | POA: Diagnosis not present

## 2015-04-12 DIAGNOSIS — M170 Bilateral primary osteoarthritis of knee: Secondary | ICD-10-CM | POA: Diagnosis not present

## 2015-04-12 DIAGNOSIS — M4806 Spinal stenosis, lumbar region: Secondary | ICD-10-CM | POA: Diagnosis not present

## 2015-04-12 DIAGNOSIS — K589 Irritable bowel syndrome without diarrhea: Secondary | ICD-10-CM | POA: Diagnosis not present

## 2015-04-12 DIAGNOSIS — I509 Heart failure, unspecified: Secondary | ICD-10-CM | POA: Diagnosis not present

## 2015-04-12 DIAGNOSIS — M4646 Discitis, unspecified, lumbar region: Secondary | ICD-10-CM | POA: Diagnosis not present

## 2015-04-12 DIAGNOSIS — M4316 Spondylolisthesis, lumbar region: Secondary | ICD-10-CM | POA: Diagnosis not present

## 2015-04-13 DIAGNOSIS — K589 Irritable bowel syndrome without diarrhea: Secondary | ICD-10-CM | POA: Diagnosis not present

## 2015-04-13 DIAGNOSIS — I509 Heart failure, unspecified: Secondary | ICD-10-CM | POA: Diagnosis not present

## 2015-04-13 DIAGNOSIS — M4316 Spondylolisthesis, lumbar region: Secondary | ICD-10-CM | POA: Diagnosis not present

## 2015-04-13 DIAGNOSIS — M170 Bilateral primary osteoarthritis of knee: Secondary | ICD-10-CM | POA: Diagnosis not present

## 2015-04-13 DIAGNOSIS — M4806 Spinal stenosis, lumbar region: Secondary | ICD-10-CM | POA: Diagnosis not present

## 2015-04-13 DIAGNOSIS — M4646 Discitis, unspecified, lumbar region: Secondary | ICD-10-CM | POA: Diagnosis not present

## 2015-04-17 DIAGNOSIS — M4646 Discitis, unspecified, lumbar region: Secondary | ICD-10-CM | POA: Diagnosis not present

## 2015-04-17 DIAGNOSIS — K589 Irritable bowel syndrome without diarrhea: Secondary | ICD-10-CM | POA: Diagnosis not present

## 2015-04-17 DIAGNOSIS — M4316 Spondylolisthesis, lumbar region: Secondary | ICD-10-CM | POA: Diagnosis not present

## 2015-04-17 DIAGNOSIS — I509 Heart failure, unspecified: Secondary | ICD-10-CM | POA: Diagnosis not present

## 2015-04-17 DIAGNOSIS — M170 Bilateral primary osteoarthritis of knee: Secondary | ICD-10-CM | POA: Diagnosis not present

## 2015-04-17 DIAGNOSIS — M4806 Spinal stenosis, lumbar region: Secondary | ICD-10-CM | POA: Diagnosis not present

## 2015-04-18 DIAGNOSIS — K589 Irritable bowel syndrome without diarrhea: Secondary | ICD-10-CM | POA: Diagnosis not present

## 2015-04-18 DIAGNOSIS — M4316 Spondylolisthesis, lumbar region: Secondary | ICD-10-CM | POA: Diagnosis not present

## 2015-04-18 DIAGNOSIS — M4646 Discitis, unspecified, lumbar region: Secondary | ICD-10-CM | POA: Diagnosis not present

## 2015-04-18 DIAGNOSIS — M170 Bilateral primary osteoarthritis of knee: Secondary | ICD-10-CM | POA: Diagnosis not present

## 2015-04-18 DIAGNOSIS — I509 Heart failure, unspecified: Secondary | ICD-10-CM | POA: Diagnosis not present

## 2015-04-18 DIAGNOSIS — M4806 Spinal stenosis, lumbar region: Secondary | ICD-10-CM | POA: Diagnosis not present

## 2015-04-19 NOTE — Progress Notes (Signed)
Addendum to PT evaluation- added G-codes    24-Mar-2015 1534  PT G-Codes **NOT FOR INPATIENT CLASS**  Functional Assessment Tool Used clinical judgment  Functional Limitation Mobility: Walking and moving around  Mobility: Walking and Moving Around Current Status (B0175) CI  Mobility: Walking and Moving Around Goal Status (Z0258) CI    Wray Kearns, PT, DPT [phone]

## 2015-05-02 DIAGNOSIS — M170 Bilateral primary osteoarthritis of knee: Secondary | ICD-10-CM | POA: Diagnosis not present

## 2015-05-02 DIAGNOSIS — M4646 Discitis, unspecified, lumbar region: Secondary | ICD-10-CM | POA: Diagnosis not present

## 2015-05-02 DIAGNOSIS — I509 Heart failure, unspecified: Secondary | ICD-10-CM | POA: Diagnosis not present

## 2015-05-02 DIAGNOSIS — M4806 Spinal stenosis, lumbar region: Secondary | ICD-10-CM | POA: Diagnosis not present

## 2015-05-02 DIAGNOSIS — M4316 Spondylolisthesis, lumbar region: Secondary | ICD-10-CM | POA: Diagnosis not present

## 2015-05-02 DIAGNOSIS — K589 Irritable bowel syndrome without diarrhea: Secondary | ICD-10-CM | POA: Diagnosis not present

## 2015-05-03 DIAGNOSIS — I10 Essential (primary) hypertension: Secondary | ICD-10-CM | POA: Diagnosis not present

## 2015-05-03 DIAGNOSIS — R250 Abnormal head movements: Secondary | ICD-10-CM | POA: Diagnosis not present

## 2015-05-03 DIAGNOSIS — K580 Irritable bowel syndrome with diarrhea: Secondary | ICD-10-CM | POA: Diagnosis not present

## 2015-05-03 DIAGNOSIS — M5136 Other intervertebral disc degeneration, lumbar region: Secondary | ICD-10-CM | POA: Diagnosis not present

## 2015-05-03 DIAGNOSIS — E784 Other hyperlipidemia: Secondary | ICD-10-CM | POA: Diagnosis not present

## 2015-05-03 DIAGNOSIS — Z131 Encounter for screening for diabetes mellitus: Secondary | ICD-10-CM | POA: Diagnosis not present

## 2015-05-03 DIAGNOSIS — J42 Unspecified chronic bronchitis: Secondary | ICD-10-CM | POA: Diagnosis not present

## 2015-05-03 DIAGNOSIS — Z6841 Body Mass Index (BMI) 40.0 and over, adult: Secondary | ICD-10-CM | POA: Diagnosis not present

## 2015-05-03 DIAGNOSIS — I509 Heart failure, unspecified: Secondary | ICD-10-CM | POA: Diagnosis not present
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
## 2015-05-10 ENCOUNTER — Ambulatory Visit (INDEPENDENT_AMBULATORY_CARE_PROVIDER_SITE_OTHER): Payer: Medicare Other | Admitting: Cardiovascular Disease

## 2015-05-10 ENCOUNTER — Encounter: Payer: Self-pay | Admitting: Cardiovascular Disease

## 2015-05-10 VITALS — BP 104/66 | HR 61 | Ht 63.0 in | Wt 260.0 lb

## 2015-05-10 DIAGNOSIS — E785 Hyperlipidemia, unspecified: Secondary | ICD-10-CM | POA: Diagnosis not present

## 2015-05-10 DIAGNOSIS — I5020 Unspecified systolic (congestive) heart failure: Secondary | ICD-10-CM | POA: Diagnosis not present

## 2015-05-10 DIAGNOSIS — R0609 Other forms of dyspnea: Secondary | ICD-10-CM | POA: Diagnosis not present

## 2015-05-10 NOTE — Patient Instructions (Signed)
Your physician has requested that you have an echocardiogram. Echocardiography is a painless test that uses sound waves to create images of your heart. It provides your doctor with information about the size and shape of your heart and how well your heart's chambers and valves are working. This procedure takes approximately one hour. There are no restrictions for this procedure.  Your physician has requested that you have a lexiscan myoview. For further information please visit [email or web address]. Please follow instruction sheet, as given.   Labs- lipid - do not eat or drink the morning of test.   Your physician recommends that you schedule a follow-up appointment in 3 months with Dr Oval Linsey.

## 2015-05-15 ENCOUNTER — Telehealth: Payer: Self-pay | Admitting: Cardiovascular Disease

## 2015-05-15 NOTE — Telephone Encounter (Signed)
[name] from [facility] is calling because , [name] Blood  Pressure 90/52. She has not taken any medication today , tired , shortness of breath .Marland Kitchen Please call to let her know what she needs to do with this patient   Thanks

## 2015-05-15 NOTE — Telephone Encounter (Signed)
Patient has telemonitoring services w/ [facility]. Amy, case Physiological scientist for patient called to note that patient reported not taking any medication today, feeling fatigued and short of breath.  BP read 90/52, HR unreported.  Advised to continue to hold losartan/HCTZ if BP runs low, take lasix if she can tolerate it and if systolic BP >225 on recheck. Should be OK to take other meds w/o effect of BP drop but will check this w/ Dr. Oval Linsey.  Caller will relay advice to patient. She has a home visit w/ patient tomorrow and will update [name] on pt condition and if anything has changed.  Advised if worse symptoms w/ SOB, or worse generally go to ED for eval.

## 2015-05-16 DIAGNOSIS — I509 Heart failure, unspecified: Secondary | ICD-10-CM | POA: Diagnosis not present

## 2015-05-16 DIAGNOSIS — M4806 Spinal stenosis, lumbar region: Secondary | ICD-10-CM | POA: Diagnosis not present

## 2015-05-16 DIAGNOSIS — M4646 Discitis, unspecified, lumbar region: Secondary | ICD-10-CM | POA: Diagnosis not present

## 2015-05-16 DIAGNOSIS — M4316 Spondylolisthesis, lumbar region: Secondary | ICD-10-CM | POA: Diagnosis not present

## 2015-05-16 DIAGNOSIS — K589 Irritable bowel syndrome without diarrhea: Secondary | ICD-10-CM | POA: Diagnosis not present

## 2015-05-16 DIAGNOSIS — M170 Bilateral primary osteoarthritis of knee: Secondary | ICD-10-CM | POA: Diagnosis not present

## 2015-05-20 DIAGNOSIS — M4646 Discitis, unspecified, lumbar region: Secondary | ICD-10-CM | POA: Diagnosis not present

## 2015-05-20 DIAGNOSIS — M4806 Spinal stenosis, lumbar region: Secondary | ICD-10-CM | POA: Diagnosis not present

## 2015-05-20 DIAGNOSIS — I509 Heart failure, unspecified: Secondary | ICD-10-CM | POA: Diagnosis not present

## 2015-05-20 DIAGNOSIS — E669 Obesity, unspecified: Secondary | ICD-10-CM | POA: Diagnosis not present

## 2015-05-20 DIAGNOSIS — M170 Bilateral primary osteoarthritis of knee: Secondary | ICD-10-CM | POA: Diagnosis not present

## 2015-05-20 DIAGNOSIS — I10 Essential (primary) hypertension: Secondary | ICD-10-CM | POA: Diagnosis not present

## 2015-05-20 DIAGNOSIS — M4316 Spondylolisthesis, lumbar region: Secondary | ICD-10-CM | POA: Diagnosis not present

## 2015-05-20 DIAGNOSIS — K589 Irritable bowel syndrome without diarrhea: Secondary | ICD-10-CM | POA: Diagnosis not present

## 2015-05-20 DIAGNOSIS — K219 Gastro-esophageal reflux disease without esophagitis: Secondary | ICD-10-CM | POA: Diagnosis not present

## 2015-05-22 ENCOUNTER — Encounter (HOSPITAL_COMMUNITY): Payer: Self-pay | Admitting: Emergency Medicine

## 2015-05-22 ENCOUNTER — Emergency Department (INDEPENDENT_AMBULATORY_CARE_PROVIDER_SITE_OTHER)
Admission: EM | Admit: 2015-05-22 | Discharge: 2015-05-22 | Disposition: A | Discharge status: Home / Self Care | Payer: Medicare Other | Source: Home / Self Care

## 2015-05-22 ENCOUNTER — Telehealth (HOSPITAL_COMMUNITY): Payer: Self-pay | Admitting: *Deleted

## 2015-05-22 DIAGNOSIS — L03211 Cellulitis of face: Secondary | ICD-10-CM

## 2015-05-22 DIAGNOSIS — M4316 Spondylolisthesis, lumbar region: Secondary | ICD-10-CM | POA: Diagnosis not present

## 2015-05-22 MED ORDER — DOXYCYCLINE HYCLATE 100 MG PO CAPS: 100.0000 mg | ORAL_CAPSULE | Freq: Two times a day (BID) | ORAL | Status: DC
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
## 2015-05-22 NOTE — Discharge Instructions (Signed)

## 2015-05-22 NOTE — Telephone Encounter (Signed)
Patient given detailed instructions per Myocardial Perfusion Study Information Sheet for test on 05/24/15 at 1230. Patient notified to arrive 15 minutes early and that it is imperative to arrive on time for appointment to keep from having the test rescheduled.  If you need to cancel or reschedule your appointment, please call the office within 24 hours of your appointment. Failure to do so may result in a cancellation of your appointment, and a $50 no show fee. Patient verbalized understanding. Kimarie Coor W   

## 2015-05-22 NOTE — ED Notes (Signed)
Pt had a small red swollen spot in the middle of her forehead just above the bridge of her nose yesterday.  It had a small black dot in the middle of it.  She woke up this morning and it was much bigger and is causing pain behind her eyes and itching in both eyes and around the swelling.

## 2015-05-24 ENCOUNTER — Ambulatory Visit (HOSPITAL_COMMUNITY): Payer: Medicare Other | Attending: Cardiology

## 2015-05-24 ENCOUNTER — Ambulatory Visit (HOSPITAL_BASED_OUTPATIENT_CLINIC_OR_DEPARTMENT_OTHER): Payer: Medicare Other

## 2015-05-24 ENCOUNTER — Other Ambulatory Visit: Payer: Self-pay

## 2015-05-24 DIAGNOSIS — I5020 Unspecified systolic (congestive) heart failure: Secondary | ICD-10-CM

## 2015-05-24 DIAGNOSIS — R0609 Other forms of dyspnea: Secondary | ICD-10-CM | POA: Diagnosis not present

## 2015-05-24 DIAGNOSIS — I059 Rheumatic mitral valve disease, unspecified: Secondary | ICD-10-CM | POA: Insufficient documentation

## 2015-05-24 DIAGNOSIS — E785 Hyperlipidemia, unspecified: Secondary | ICD-10-CM | POA: Insufficient documentation

## 2015-05-24 DIAGNOSIS — I10 Essential (primary) hypertension: Secondary | ICD-10-CM | POA: Diagnosis not present

## 2015-05-24 MED ORDER — TECHNETIUM TC 99M SESTAMIBI GENERIC - CARDIOLITE
33.0000 | Freq: Once | INTRAVENOUS | Status: AC | PRN
  Administered 2015-05-24: 33 via INTRAVENOUS

## 2015-05-24 MED ORDER — REGADENOSON 0.4 MG/5ML IV SOLN
0.4000 mg | Freq: Once | INTRAVENOUS | Status: AC
  Administered 2015-05-24: 0.4 mg via INTRAVENOUS

## 2015-05-25 ENCOUNTER — Ambulatory Visit (HOSPITAL_COMMUNITY): Payer: Medicare Other | Attending: Cardiovascular Disease

## 2015-05-25 DIAGNOSIS — R0989 Other specified symptoms and signs involving the circulatory and respiratory systems: Secondary | ICD-10-CM

## 2015-05-25 LAB — MYOCARDIAL PERFUSION IMAGING
CHL CUP NUCLEAR SRS: 9
LV dias vol: 98 mL
LV sys vol: 41 mL
NUC STRESS TID: 0.86
Peak HR: 82 {beats}/min
RATE: 0.34
Rest HR: 51 {beats}/min
SDS: 0
SSS: 9

## 2015-05-25 MED ORDER — TECHNETIUM TC 99M SESTAMIBI GENERIC - CARDIOLITE
32.8000 | Freq: Once | INTRAVENOUS | Status: AC | PRN
  Administered 2015-05-25: 32.8 via INTRAVENOUS

## 2015-05-26 ENCOUNTER — Telehealth: Payer: Self-pay | Admitting: *Deleted

## 2015-05-26 NOTE — Telephone Encounter (Signed)
LEFT MESSAGE TO CALL BACK

## 2015-05-26 NOTE — Telephone Encounter (Signed)
-----   Message from Skeet Latch, MD sent at 05/26/2015 10:53 AM EDT ----- Normal stress.

## 2015-05-26 NOTE — Telephone Encounter (Signed)
pt aware of results  

## 2015-05-26 NOTE — Telephone Encounter (Signed)
Pt is returning your call. Thanks

## 2015-05-29 ENCOUNTER — Telehealth: Payer: Self-pay | Admitting: *Deleted

## 2015-05-29 NOTE — Telephone Encounter (Signed)
Spoke to patient. Result given . Verbalized understanding  

## 2015-05-29 NOTE — Telephone Encounter (Signed)
-----   Message from Skeet Latch, MD sent at 05/28/2015 11:23 AM EDT ----- Mild diastolic heart failure, meaning the heart does not relax completely.  This probably does not explain her shortness of breath, as it is mild.  It is important to keep her blood pressure under good control.

## 2015-05-30 DIAGNOSIS — M4646 Discitis, unspecified, lumbar region: Secondary | ICD-10-CM | POA: Diagnosis not present

## 2015-05-30 DIAGNOSIS — M4806 Spinal stenosis, lumbar region: Secondary | ICD-10-CM | POA: Diagnosis not present

## 2015-05-30 DIAGNOSIS — M170 Bilateral primary osteoarthritis of knee: Secondary | ICD-10-CM | POA: Diagnosis not present

## 2015-05-30 DIAGNOSIS — K589 Irritable bowel syndrome without diarrhea: Secondary | ICD-10-CM | POA: Diagnosis not present

## 2015-05-30 DIAGNOSIS — I509 Heart failure, unspecified: Secondary | ICD-10-CM | POA: Diagnosis not present

## 2015-05-30 DIAGNOSIS — M4316 Spondylolisthesis, lumbar region: Secondary | ICD-10-CM | POA: Diagnosis not present

## 2015-06-02 ENCOUNTER — Ambulatory Visit: Payer: Medicare Other | Admitting: Physical Therapy

## 2015-06-05 DIAGNOSIS — M4646 Discitis, unspecified, lumbar region: Secondary | ICD-10-CM | POA: Diagnosis not present

## 2015-06-05 DIAGNOSIS — M4806 Spinal stenosis, lumbar region: Secondary | ICD-10-CM | POA: Diagnosis not present

## 2015-06-05 DIAGNOSIS — M4316 Spondylolisthesis, lumbar region: Secondary | ICD-10-CM | POA: Diagnosis not present

## 2015-06-05 DIAGNOSIS — I509 Heart failure, unspecified: Secondary | ICD-10-CM | POA: Diagnosis not present

## 2015-06-13 DIAGNOSIS — M170 Bilateral primary osteoarthritis of knee: Secondary | ICD-10-CM | POA: Diagnosis not present

## 2015-06-13 DIAGNOSIS — K589 Irritable bowel syndrome without diarrhea: Secondary | ICD-10-CM | POA: Diagnosis not present

## 2015-06-13 DIAGNOSIS — M4806 Spinal stenosis, lumbar region: Secondary | ICD-10-CM | POA: Diagnosis not present

## 2015-06-13 DIAGNOSIS — M4316 Spondylolisthesis, lumbar region: Secondary | ICD-10-CM | POA: Diagnosis not present

## 2015-06-13 DIAGNOSIS — I509 Heart failure, unspecified: Secondary | ICD-10-CM | POA: Diagnosis not present

## 2015-06-13 DIAGNOSIS — M4646 Discitis, unspecified, lumbar region: Secondary | ICD-10-CM | POA: Diagnosis not present

## 2015-06-22 DIAGNOSIS — M4646 Discitis, unspecified, lumbar region: Secondary | ICD-10-CM | POA: Diagnosis not present

## 2015-06-22 DIAGNOSIS — K589 Irritable bowel syndrome without diarrhea: Secondary | ICD-10-CM | POA: Diagnosis not present

## 2015-06-22 DIAGNOSIS — I10 Essential (primary) hypertension: Secondary | ICD-10-CM | POA: Diagnosis not present

## 2015-06-22 DIAGNOSIS — M170 Bilateral primary osteoarthritis of knee: Secondary | ICD-10-CM | POA: Diagnosis not present

## 2015-06-22 DIAGNOSIS — I509 Heart failure, unspecified: Secondary | ICD-10-CM | POA: Diagnosis not present

## 2015-06-22 DIAGNOSIS — K219 Gastro-esophageal reflux disease without esophagitis: Secondary | ICD-10-CM | POA: Diagnosis not present

## 2015-06-22 DIAGNOSIS — M4316 Spondylolisthesis, lumbar region: Secondary | ICD-10-CM | POA: Diagnosis not present

## 2015-06-22 DIAGNOSIS — M4806 Spinal stenosis, lumbar region: Secondary | ICD-10-CM | POA: Diagnosis not present

## 2015-06-26 DIAGNOSIS — M47816 Spondylosis without myelopathy or radiculopathy, lumbar region: Secondary | ICD-10-CM | POA: Diagnosis not present

## 2015-06-26 DIAGNOSIS — M4316 Spondylolisthesis, lumbar region: Secondary | ICD-10-CM | POA: Diagnosis not present

## 2015-06-26 DIAGNOSIS — Z6841 Body Mass Index (BMI) 40.0 and over, adult: Secondary | ICD-10-CM | POA: Diagnosis not present

## 2015-06-28 DIAGNOSIS — M170 Bilateral primary osteoarthritis of knee: Secondary | ICD-10-CM | POA: Diagnosis not present

## 2015-06-28 DIAGNOSIS — I509 Heart failure, unspecified: Secondary | ICD-10-CM | POA: Diagnosis not present

## 2015-06-28 DIAGNOSIS — M4806 Spinal stenosis, lumbar region: Secondary | ICD-10-CM | POA: Diagnosis not present

## 2015-06-28 DIAGNOSIS — M4646 Discitis, unspecified, lumbar region: Secondary | ICD-10-CM | POA: Diagnosis not present

## 2015-06-28 DIAGNOSIS — K589 Irritable bowel syndrome without diarrhea: Secondary | ICD-10-CM | POA: Diagnosis not present

## 2015-06-28 DIAGNOSIS — M4316 Spondylolisthesis, lumbar region: Secondary | ICD-10-CM | POA: Diagnosis not present

## 2015-06-29 DIAGNOSIS — M4806 Spinal stenosis, lumbar region: Secondary | ICD-10-CM | POA: Diagnosis not present

## 2015-06-29 DIAGNOSIS — M170 Bilateral primary osteoarthritis of knee: Secondary | ICD-10-CM | POA: Diagnosis not present

## 2015-06-29 DIAGNOSIS — I509 Heart failure, unspecified: Secondary | ICD-10-CM | POA: Diagnosis not present

## 2015-06-29 DIAGNOSIS — K589 Irritable bowel syndrome without diarrhea: Secondary | ICD-10-CM | POA: Diagnosis not present

## 2015-06-29 DIAGNOSIS — M4316 Spondylolisthesis, lumbar region: Secondary | ICD-10-CM | POA: Diagnosis not present

## 2015-06-29 DIAGNOSIS — M4646 Discitis, unspecified, lumbar region: Secondary | ICD-10-CM | POA: Diagnosis not present

## 2015-06-30 DIAGNOSIS — Z23 Encounter for immunization: Secondary | ICD-10-CM | POA: Diagnosis not present

## 2015-06-30 DIAGNOSIS — R42 Dizziness and giddiness: Secondary | ICD-10-CM | POA: Diagnosis not present

## 2015-06-30 DIAGNOSIS — I509 Heart failure, unspecified: Secondary | ICD-10-CM | POA: Diagnosis not present

## 2015-06-30 DIAGNOSIS — I10 Essential (primary) hypertension: Secondary | ICD-10-CM | POA: Diagnosis not present

## 2015-06-30 DIAGNOSIS — K580 Irritable bowel syndrome with diarrhea: Secondary | ICD-10-CM | POA: Diagnosis not present

## 2015-06-30 DIAGNOSIS — E784 Other hyperlipidemia: Secondary | ICD-10-CM | POA: Diagnosis not present

## 2015-07-03 DIAGNOSIS — K589 Irritable bowel syndrome without diarrhea: Secondary | ICD-10-CM | POA: Diagnosis not present

## 2015-07-03 DIAGNOSIS — M4316 Spondylolisthesis, lumbar region: Secondary | ICD-10-CM | POA: Diagnosis not present

## 2015-07-03 DIAGNOSIS — M4646 Discitis, unspecified, lumbar region: Secondary | ICD-10-CM | POA: Diagnosis not present

## 2015-07-03 DIAGNOSIS — I509 Heart failure, unspecified: Secondary | ICD-10-CM | POA: Diagnosis not present

## 2015-07-03 DIAGNOSIS — M170 Bilateral primary osteoarthritis of knee: Secondary | ICD-10-CM | POA: Diagnosis not present

## 2015-07-03 DIAGNOSIS — M4806 Spinal stenosis, lumbar region: Secondary | ICD-10-CM | POA: Diagnosis not present

## 2015-07-05 DIAGNOSIS — K589 Irritable bowel syndrome without diarrhea: Secondary | ICD-10-CM | POA: Diagnosis not present

## 2015-07-05 DIAGNOSIS — M170 Bilateral primary osteoarthritis of knee: Secondary | ICD-10-CM | POA: Diagnosis not present

## 2015-07-05 DIAGNOSIS — M4316 Spondylolisthesis, lumbar region: Secondary | ICD-10-CM | POA: Diagnosis not present

## 2015-07-05 DIAGNOSIS — I509 Heart failure, unspecified: Secondary | ICD-10-CM | POA: Diagnosis not present

## 2015-07-05 DIAGNOSIS — M4646 Discitis, unspecified, lumbar region: Secondary | ICD-10-CM | POA: Diagnosis not present

## 2015-07-05 DIAGNOSIS — M4806 Spinal stenosis, lumbar region: Secondary | ICD-10-CM | POA: Diagnosis not present

## 2015-07-06 ENCOUNTER — Telehealth: Payer: Self-pay | Admitting: Cardiovascular Disease

## 2015-07-06 DIAGNOSIS — M4646 Discitis, unspecified, lumbar region: Secondary | ICD-10-CM | POA: Diagnosis not present

## 2015-07-06 DIAGNOSIS — K589 Irritable bowel syndrome without diarrhea: Secondary | ICD-10-CM | POA: Diagnosis not present

## 2015-07-06 DIAGNOSIS — M4806 Spinal stenosis, lumbar region: Secondary | ICD-10-CM | POA: Diagnosis not present

## 2015-07-06 DIAGNOSIS — M170 Bilateral primary osteoarthritis of knee: Secondary | ICD-10-CM | POA: Diagnosis not present

## 2015-07-06 DIAGNOSIS — I509 Heart failure, unspecified: Secondary | ICD-10-CM | POA: Diagnosis not present

## 2015-07-06 DIAGNOSIS — M4316 Spondylolisthesis, lumbar region: Secondary | ICD-10-CM | POA: Diagnosis not present

## 2015-07-06 NOTE — Telephone Encounter (Signed)
Call in today from [facility], [facility] RN, wanting to discuss weight gain and SOB of pt.  Pt has increased fluctuating weights over the past few weeks and current weight is 268 lb.  Pt has been gaining and loosing 2-3 lbs over night but over the past week she has been trending up from baseline weight of 260 lb.  Pt has not had any improvement of SOB since 9/21 OV with Dr. Oval Linsey.  She has no c/o of edema in legs but does state she feels swollen in her abdomen.  Pt also c/o of dizziness, nausea, and vomiting at times.  Pt has had minimal to no salt intake.  Pt has no c/o of chest pain or pressure.   Amy says that pt states she has times of palpitations/"funny feeling of heart", she listened to pt for one minute and says she noticed 3 pauses, at which time pt indicated that was the funny feeling. Pt has been taking medications as prescribed, but did miss Lasix dose yesterday 11/16. Pt saw PCP last week where they did blood work, but she is unsure what they did. HH RN did blood work on pt 10/4 with CR. Of 1.22 and K 3.3. Pt setup to see PA in [facility] tomorrow 11/18 @ 11:30am. Pt aware, verbalized understanding, no questions at this time.

## 2015-07-07 ENCOUNTER — Encounter: Payer: Self-pay | Admitting: Cardiology

## 2015-07-07 ENCOUNTER — Ambulatory Visit (INDEPENDENT_AMBULATORY_CARE_PROVIDER_SITE_OTHER): Payer: Medicare Other | Admitting: Cardiology

## 2015-07-07 VITALS — BP 119/82 | HR 63 | Ht 63.0 in | Wt 264.0 lb

## 2015-07-07 DIAGNOSIS — I10 Essential (primary) hypertension: Secondary | ICD-10-CM | POA: Diagnosis not present

## 2015-07-07 DIAGNOSIS — I5032 Chronic diastolic (congestive) heart failure: Secondary | ICD-10-CM

## 2015-07-07 DIAGNOSIS — R002 Palpitations: Secondary | ICD-10-CM | POA: Diagnosis not present

## 2015-07-07 DIAGNOSIS — E785 Hyperlipidemia, unspecified: Secondary | ICD-10-CM

## 2015-07-07 DIAGNOSIS — R0600 Dyspnea, unspecified: Secondary | ICD-10-CM

## 2015-07-07 LAB — BASIC METABOLIC PANEL
BUN: 13 mg/dL (ref 7–25)
CO2: 28 mmol/L (ref 20–31)
Calcium: 9.6 mg/dL (ref 8.6–10.4)
Chloride: 100 mmol/L (ref 98–110)
Creat: 1.22 mg/dL — ABNORMAL HIGH (ref 0.50–0.99)
GLUCOSE: 83 mg/dL (ref 65–99)
Potassium: 4 mmol/L (ref 3.5–5.3)
SODIUM: 139 mmol/L (ref 135–146)

## 2015-07-07 LAB — TSH: TSH: 2.848 u[IU]/mL (ref 0.350–4.500)

## 2015-07-07 NOTE — Progress Notes (Signed)
07/07/2015 [name]   04-17-1951  [id number]  Primary Physician Philis Fendt, MD Primary Cardiologist: Dr. Oval Linsey   Reason for Visit/CC: Palpitations  HPI:  The patient is a 64 year old obese female, followed by Dr. Oval Linsey, with a history of chronic diastolic heart failure, hypertension and hyperlipidemia. She was recently seen by Dr. Oval Linsey 05/10/15 and noted symptoms of fatigue and dyspnea on exertion. However, she denied chest pain. Dr. Oval Linsey ordered for her to be evaluated with a nuclear stress test and a 2-D echo. Both studies were fairly unremarkable. Her stress test was low risk without ischemia and showed normal LVF. Her 2-D echo revealed normal left ventricular systolic function with an estimated ejection fraction of 50%. Grade 1 diastolic dysfunction was noted. There were no significant valvular abnormalities. She was instructed to continue lisinopril and Lasix as well as simvastatin. She was advised to follow-up with Dr. Oval Linsey in 3 months for repeat evaluation. This appointment is scheduled for 08/16/2014. However, she now presents back to clinic with a new complaint of palpitations.   Over the last week she has noticed an unusual "fluttering" in her chest. She reports at [facility] also checked her pulse and felt that it was slightly irregular. Symptoms have occurred off and on. She denies any chest discomfort. She notes continued dyspnea with exertion and some occasional resting dyspnea as well as subjective weight gain. She denies syncope/near-syncope but notes constant fatigue. She reports full medication compliance and adherence to a low-sodium diet.   EKG in clinic today demonstrates a normal sinus rhythm. Heart rate 63 bpm. She is currently asymptomatic.    Current Outpatient Prescriptions  Medication Sig Dispense Refill  . albuterol (PROVENTIL HFA;VENTOLIN HFA) 108 (90 BASE) MCG/ACT inhaler Inhale 2 puffs into the lungs every 6 (six)  hours as needed for wheezing.    Marland Kitchen albuterol (PROVENTIL) (2.5 MG/3ML) 0.083% nebulizer solution Take 2.5 mg by nebulization every 6 (six) hours as needed for wheezing.    . citalopram (CELEXA) 40 MG tablet Take 40 mg by mouth daily.  0  . cloNIDine (CATAPRES - DOSED IN MG/24 HR) 0.3 mg/24hr patch Place 0.3 mg onto the skin once a week.    . diphenoxylate-atropine (LOMOTIL) 2.5-0.025 MG per tablet Take 1 tablet by mouth daily as needed. Upset stomach  0  . Eluxadoline (VIBERZI) 75 MG TABS Take 75 mg by mouth 2 (two) times daily.    . Esomeprazole Magnesium (NEXIUM PO) Take 20 mg by mouth daily.    . furosemide (LASIX) 80 MG tablet Take 80 mg by mouth daily.     Marland Kitchen losartan-hydrochlorothiazide (HYZAAR) 100-12.5 MG per tablet Take 1 tablet by mouth daily.    Marland Kitchen oxyCODONE 10 MG TABS Take 1 tablet (10 mg total) by mouth every 4 (four) hours as needed for moderate pain. 30 tablet 0  . potassium chloride SA (K-DUR,KLOR-CON) 20 MEQ tablet Take 20 mEq by mouth 2 (two) times daily.    . simvastatin (ZOCOR) 20 MG tablet Take 20 mg by mouth every evening.    . traMADol (ULTRAM) 50 MG tablet Take 50 mg by mouth every 6 (six) hours as needed for moderate pain.     . Vitamin D, Ergocalciferol, (DRISDOL) 50000 UNITS CAPS Take 50,000 Units by mouth every 7 (seven) days. On Wednesday     No current facility-administered medications for this visit.    Allergies  Allergen Reactions  . Aspirin Other (See Comments)    Stomach bleeding  .  Eggs Or Egg-Derived Products Nausea And Vomiting  . Mushroom Extract Complex Hives and Itching  . Shellfish Allergy Hives and Itching  . Sulfa Antibiotics Hives and Itching  . Betadine [Povidone Iodine] Itching  . Coconut Flavor Itching  . Codeine Itching  . Iodine Itching  . Ivp Dye [Iodinated Diagnostic Agents] Hives    Takes Benadryl 50mg  PO before receiving iodinated contrast     Social History   Social History  . Marital Status: Divorced    Spouse Name: N/A  .  Number of Children: N/A  . Years of Education: N/A   Occupational History  . Not on file.   Social History Main Topics  . Smoking status: Never Smoker   . Smokeless tobacco: Never Used  . Alcohol Use: Yes     Comment: "stopped drinking in the 1980's; never drank much"  . Drug Use: No  . Sexual Activity: No   Other Topics Concern  . Not on file   Social History Narrative     Review of Systems: General: negative for chills, fever, night sweats or weight changes.  Cardiovascular: negative for chest pain, dyspnea on exertion, edema, orthopnea, palpitations, paroxysmal nocturnal dyspnea or shortness of breath Dermatological: negative for rash Respiratory: negative for cough or wheezing Urologic: negative for hematuria Abdominal: negative for nausea, vomiting, diarrhea, bright red blood per rectum, melena, or hematemesis Neurologic: negative for visual changes, syncope, or dizziness All other systems reviewed and are otherwise negative except as noted above.    Blood pressure 119/82, pulse 63, height 5\' 3"  (1.6 m), weight 264 lb (119.75 kg).  General appearance: alert, cooperative and no distress Neck: no carotid bruit and no JVD Lungs: clear to auscultation bilaterally Heart: regular rate and rhythm, S1, S2 normal, no murmur, click, rub or gallop Extremities: no LEE Pulses: 2+ and symmetric Skin: warm and dry Neurologic: Grossly normal  EKG NSR. 63 bpm.  ASSESSMENT AND PLAN:   1. Palpitations: EKG today currently shows NSR. However patient notes 1 week h/o intermittent palpitations. Recent 2D echo and NST normal. She has chronic diastolic heart failure but appears to be stable from a volume standpoint based on physical exam today. Given her recent symptoms of palpitations along with dyspnea on exertion as well as fatigue, we will arrange for her to be further evaluated with a 72 hour heart monitor to assess for arrhythmias. We will also check a TSH given her palpitations and  symptoms of fatigue, as well as a BMP.  2. Chronic Diastolic CHF: Recent 2-D echo revealed normal left ventricular systolic function with an estimated ejection fraction of 50% however grade 2 diastolic dysfunction was noted. She appears to be fairly euvolemic on physical exam today without evidence of JVD, lower extremity edema or pulmonic rales. However she does note a recent 5 pound increase in weight over the last several days and endorses abdominal distention. We will check a BNP to further evaluate as well as a BMP to assess renal function and electrolytes, in the event that we will need to instruct her to increase her Lasix. For now, she has been advised to continue her regular dose of lasix until she is instructed otherwise. Low-sodium diet was recommended.  3. Hypertension: Well-controlled on current regimen.  4. Hyperlipidemia: Continue statin therapy with simvastatin.    [name] PA-C 07/07/2015 11:37 AM

## 2015-07-07 NOTE — Patient Instructions (Signed)
Medication Instructions:  Your physician recommends that you continue on your current medications as directed. Please refer to the Current Medication list given to you today.   Labwork: Your physician recommends that you have lab work today:tsh/bnp/bmp   Testing/Procedures: Your physician has recommended that you wear a holter monitor. Holter monitors are medical devices that record the heart's electrical activity. Doctors most often use these monitors to diagnose arrhythmias. Arrhythmias are problems with the speed or rhythm of the heartbeat. The monitor is a small, portable device. You can wear one while you do your normal daily activities. This is usually used to diagnose what is causing palpitations/syncope (passing out).    Follow-Up: Your physician recommends that you keep your scheduled follow-up appointment with Dr. Oval Linsey   Any Other Special Instructions Will Be Listed Below (If Applicable).     If you need a refill on your cardiac medications before your next appointment, please call your pharmacy.

## 2015-07-08 LAB — BRAIN NATRIURETIC PEPTIDE: Brain Natriuretic Peptide: 32.1 pg/mL (ref 0.0–100.0)

## 2015-07-10 ENCOUNTER — Other Ambulatory Visit: Payer: Self-pay | Admitting: *Deleted

## 2015-07-10 DIAGNOSIS — I509 Heart failure, unspecified: Secondary | ICD-10-CM | POA: Diagnosis not present

## 2015-07-10 DIAGNOSIS — M4316 Spondylolisthesis, lumbar region: Secondary | ICD-10-CM | POA: Diagnosis not present

## 2015-07-10 DIAGNOSIS — K589 Irritable bowel syndrome without diarrhea: Secondary | ICD-10-CM | POA: Diagnosis not present

## 2015-07-10 DIAGNOSIS — R002 Palpitations: Secondary | ICD-10-CM

## 2015-07-10 DIAGNOSIS — M4646 Discitis, unspecified, lumbar region: Secondary | ICD-10-CM | POA: Diagnosis not present

## 2015-07-10 DIAGNOSIS — M4806 Spinal stenosis, lumbar region: Secondary | ICD-10-CM | POA: Diagnosis not present

## 2015-07-10 DIAGNOSIS — M170 Bilateral primary osteoarthritis of knee: Secondary | ICD-10-CM | POA: Diagnosis not present

## 2015-07-11 DIAGNOSIS — M47816 Spondylosis without myelopathy or radiculopathy, lumbar region: Secondary | ICD-10-CM | POA: Diagnosis not present

## 2015-07-12 DIAGNOSIS — M4806 Spinal stenosis, lumbar region: Secondary | ICD-10-CM | POA: Diagnosis not present

## 2015-07-12 DIAGNOSIS — K589 Irritable bowel syndrome without diarrhea: Secondary | ICD-10-CM | POA: Diagnosis not present

## 2015-07-12 DIAGNOSIS — I509 Heart failure, unspecified: Secondary | ICD-10-CM | POA: Diagnosis not present

## 2015-07-12 DIAGNOSIS — M4646 Discitis, unspecified, lumbar region: Secondary | ICD-10-CM | POA: Diagnosis not present

## 2015-07-12 DIAGNOSIS — M170 Bilateral primary osteoarthritis of knee: Secondary | ICD-10-CM | POA: Diagnosis not present

## 2015-07-12 DIAGNOSIS — M4316 Spondylolisthesis, lumbar region: Secondary | ICD-10-CM | POA: Diagnosis not present

## 2015-07-14 DIAGNOSIS — M170 Bilateral primary osteoarthritis of knee: Secondary | ICD-10-CM | POA: Diagnosis not present

## 2015-07-14 DIAGNOSIS — M4646 Discitis, unspecified, lumbar region: Secondary | ICD-10-CM | POA: Diagnosis not present

## 2015-07-14 DIAGNOSIS — M4806 Spinal stenosis, lumbar region: Secondary | ICD-10-CM | POA: Diagnosis not present

## 2015-07-14 DIAGNOSIS — I509 Heart failure, unspecified: Secondary | ICD-10-CM | POA: Diagnosis not present

## 2015-07-14 DIAGNOSIS — K589 Irritable bowel syndrome without diarrhea: Secondary | ICD-10-CM | POA: Diagnosis not present

## 2015-07-14 DIAGNOSIS — M4316 Spondylolisthesis, lumbar region: Secondary | ICD-10-CM | POA: Diagnosis not present

## 2015-07-18 DIAGNOSIS — K589 Irritable bowel syndrome without diarrhea: Secondary | ICD-10-CM | POA: Diagnosis not present

## 2015-07-18 DIAGNOSIS — I509 Heart failure, unspecified: Secondary | ICD-10-CM | POA: Diagnosis not present

## 2015-07-18 DIAGNOSIS — M4806 Spinal stenosis, lumbar region: Secondary | ICD-10-CM | POA: Diagnosis not present

## 2015-07-18 DIAGNOSIS — M4316 Spondylolisthesis, lumbar region: Secondary | ICD-10-CM | POA: Diagnosis not present

## 2015-07-18 DIAGNOSIS — M170 Bilateral primary osteoarthritis of knee: Secondary | ICD-10-CM | POA: Diagnosis not present

## 2015-07-18 DIAGNOSIS — M4646 Discitis, unspecified, lumbar region: Secondary | ICD-10-CM | POA: Diagnosis not present

## 2015-07-19 ENCOUNTER — Ambulatory Visit (INDEPENDENT_AMBULATORY_CARE_PROVIDER_SITE_OTHER): Payer: Medicare Other

## 2015-07-19 DIAGNOSIS — K219 Gastro-esophageal reflux disease without esophagitis: Secondary | ICD-10-CM | POA: Diagnosis not present

## 2015-07-19 DIAGNOSIS — M4316 Spondylolisthesis, lumbar region: Secondary | ICD-10-CM | POA: Diagnosis not present

## 2015-07-19 DIAGNOSIS — R002 Palpitations: Secondary | ICD-10-CM | POA: Diagnosis not present

## 2015-07-19 DIAGNOSIS — I509 Heart failure, unspecified: Secondary | ICD-10-CM | POA: Diagnosis not present

## 2015-07-19 DIAGNOSIS — F419 Anxiety disorder, unspecified: Secondary | ICD-10-CM | POA: Diagnosis not present

## 2015-07-19 DIAGNOSIS — K589 Irritable bowel syndrome without diarrhea: Secondary | ICD-10-CM | POA: Diagnosis not present

## 2015-07-19 DIAGNOSIS — M47816 Spondylosis without myelopathy or radiculopathy, lumbar region: Secondary | ICD-10-CM | POA: Diagnosis not present

## 2015-07-19 DIAGNOSIS — M4806 Spinal stenosis, lumbar region: Secondary | ICD-10-CM | POA: Diagnosis not present

## 2015-07-19 DIAGNOSIS — M170 Bilateral primary osteoarthritis of knee: Secondary | ICD-10-CM | POA: Diagnosis not present

## 2015-07-19 DIAGNOSIS — Z9181 History of falling: Secondary | ICD-10-CM | POA: Diagnosis not present

## 2015-07-19 DIAGNOSIS — I110 Hypertensive heart disease with heart failure: Secondary | ICD-10-CM | POA: Diagnosis not present

## 2015-07-20 ENCOUNTER — Telehealth: Payer: Self-pay | Admitting: Cardiovascular Disease

## 2015-07-20 NOTE — Telephone Encounter (Signed)
Amy  RN  with [facility] called   patient is on telmonitoring for  HEART FAILURE-weight gain PATIENT HAS HAD 5 LB GAIN OVER NIGHT  07/18/15  ---260 LB 07/19/15 ---- 262.25 LB 07/20/15 ------267.5 LB Per AMY , patient states hse has taken medication as prescribed. And actually took a double dose of lasix this morning 80 mg( total 160 mg) C/o shortness of breath ,but this is not new.  Amy wanted to know result of echo and stress test. - result given Amy states  Can do labs or IV MEDS IF NEEDED)  Through [facility]   [facility] will defer to [facility]

## 2015-07-21 NOTE — Telephone Encounter (Signed)
Spoke to AMY, RN INFORMATION GIVEN  AMY SATES SHE WILL GIVE INFORMATION TO PATIENT. AMY STATES PATIENT WEIGH HERSELF LATER IN THE DAY 07/20/15 WEIGHT WAS 264 LBS LABS WILL BE DRAWN ON MONDAY 07/24/15  LASIX 80 MG BID  UNTIL DRY WEIGHT .

## 2015-07-21 NOTE — Telephone Encounter (Signed)
-----   Message from Skeet Latch, MD sent at 07/20/2015 11:05 PM EST ----- If she could check a BMP and BNP that would be helpful.  [name] may need IV lasix, but I would like to get those labs ordered before ordering that medication.  For now take lasix 80 mg twice daily instead of daily.

## 2015-07-21 NOTE — Telephone Encounter (Signed)
-----   Message from Skeet Latch, MD sent at 07/20/2015 11:05 PM EST ----- If she could check a BMP and BNP that would be helpful.  Ms. Dronet may need IV lasix, but I would like to get those labs ordered before ordering that medication.  For now take lasix 80 mg twice daily instead of daily.

## 2015-07-24 DIAGNOSIS — R0600 Dyspnea, unspecified: Secondary | ICD-10-CM | POA: Diagnosis not present

## 2015-07-24 DIAGNOSIS — I110 Hypertensive heart disease with heart failure: Secondary | ICD-10-CM | POA: Diagnosis not present

## 2015-07-24 DIAGNOSIS — M4316 Spondylolisthesis, lumbar region: Secondary | ICD-10-CM | POA: Diagnosis not present

## 2015-07-24 DIAGNOSIS — I509 Heart failure, unspecified: Secondary | ICD-10-CM | POA: Diagnosis not present

## 2015-07-24 DIAGNOSIS — M47816 Spondylosis without myelopathy or radiculopathy, lumbar region: Secondary | ICD-10-CM | POA: Diagnosis not present

## 2015-07-24 DIAGNOSIS — K589 Irritable bowel syndrome without diarrhea: Secondary | ICD-10-CM | POA: Diagnosis not present

## 2015-07-24 DIAGNOSIS — M4806 Spinal stenosis, lumbar region: Secondary | ICD-10-CM | POA: Diagnosis not present

## 2015-08-01 ENCOUNTER — Telehealth: Payer: Self-pay | Admitting: Cardiovascular Disease

## 2015-08-01 NOTE — Telephone Encounter (Signed)
Extend PT 2 times a week for the next 3 weeks

## 2015-08-01 NOTE — Telephone Encounter (Signed)
Received a call from [place] with [facility].Stated he wanted to ask Dr.Berry if ok to extend patient's PT to 2 times a week for 3 more weeks.Message sent to [facility].

## 2015-08-01 NOTE — Telephone Encounter (Signed)
Returned call to [place] with [facility] left message on personal voice mail Dr.Berry's recommendations.

## 2015-08-02 DIAGNOSIS — I110 Hypertensive heart disease with heart failure: Secondary | ICD-10-CM | POA: Diagnosis not present

## 2015-08-02 DIAGNOSIS — M4806 Spinal stenosis, lumbar region: Secondary | ICD-10-CM | POA: Diagnosis not present

## 2015-08-02 DIAGNOSIS — I509 Heart failure, unspecified: Secondary | ICD-10-CM | POA: Diagnosis not present

## 2015-08-02 DIAGNOSIS — M47816 Spondylosis without myelopathy or radiculopathy, lumbar region: Secondary | ICD-10-CM | POA: Diagnosis not present

## 2015-08-02 DIAGNOSIS — M4316 Spondylolisthesis, lumbar region: Secondary | ICD-10-CM | POA: Diagnosis not present

## 2015-08-02 DIAGNOSIS — K589 Irritable bowel syndrome without diarrhea: Secondary | ICD-10-CM | POA: Diagnosis not present

## 2015-08-04 DIAGNOSIS — I509 Heart failure, unspecified: Secondary | ICD-10-CM | POA: Diagnosis not present

## 2015-08-04 DIAGNOSIS — M4316 Spondylolisthesis, lumbar region: Secondary | ICD-10-CM | POA: Diagnosis not present

## 2015-08-04 DIAGNOSIS — K589 Irritable bowel syndrome without diarrhea: Secondary | ICD-10-CM | POA: Diagnosis not present

## 2015-08-04 DIAGNOSIS — M4806 Spinal stenosis, lumbar region: Secondary | ICD-10-CM | POA: Diagnosis not present

## 2015-08-04 DIAGNOSIS — M47816 Spondylosis without myelopathy or radiculopathy, lumbar region: Secondary | ICD-10-CM | POA: Diagnosis not present

## 2015-08-04 DIAGNOSIS — I110 Hypertensive heart disease with heart failure: Secondary | ICD-10-CM | POA: Diagnosis not present

## 2015-08-07 DIAGNOSIS — I110 Hypertensive heart disease with heart failure: Secondary | ICD-10-CM | POA: Diagnosis not present

## 2015-08-07 DIAGNOSIS — I509 Heart failure, unspecified: Secondary | ICD-10-CM | POA: Diagnosis not present

## 2015-08-07 DIAGNOSIS — M47816 Spondylosis without myelopathy or radiculopathy, lumbar region: Secondary | ICD-10-CM | POA: Diagnosis not present

## 2015-08-07 DIAGNOSIS — K589 Irritable bowel syndrome without diarrhea: Secondary | ICD-10-CM | POA: Diagnosis not present

## 2015-08-07 DIAGNOSIS — M4316 Spondylolisthesis, lumbar region: Secondary | ICD-10-CM | POA: Diagnosis not present

## 2015-08-07 DIAGNOSIS — M4806 Spinal stenosis, lumbar region: Secondary | ICD-10-CM | POA: Diagnosis not present

## 2015-08-08 DIAGNOSIS — M4806 Spinal stenosis, lumbar region: Secondary | ICD-10-CM | POA: Diagnosis not present

## 2015-08-08 DIAGNOSIS — K589 Irritable bowel syndrome without diarrhea: Secondary | ICD-10-CM | POA: Diagnosis not present

## 2015-08-08 DIAGNOSIS — M4316 Spondylolisthesis, lumbar region: Secondary | ICD-10-CM | POA: Diagnosis not present

## 2015-08-08 DIAGNOSIS — M47816 Spondylosis without myelopathy or radiculopathy, lumbar region: Secondary | ICD-10-CM | POA: Diagnosis not present

## 2015-08-08 DIAGNOSIS — I509 Heart failure, unspecified: Secondary | ICD-10-CM | POA: Diagnosis not present

## 2015-08-08 DIAGNOSIS — I110 Hypertensive heart disease with heart failure: Secondary | ICD-10-CM | POA: Diagnosis not present

## 2015-08-09 DIAGNOSIS — M4806 Spinal stenosis, lumbar region: Secondary | ICD-10-CM | POA: Diagnosis not present

## 2015-08-09 DIAGNOSIS — K589 Irritable bowel syndrome without diarrhea: Secondary | ICD-10-CM | POA: Diagnosis not present

## 2015-08-09 DIAGNOSIS — M4316 Spondylolisthesis, lumbar region: Secondary | ICD-10-CM | POA: Diagnosis not present

## 2015-08-09 DIAGNOSIS — I10 Essential (primary) hypertension: Secondary | ICD-10-CM | POA: Diagnosis not present

## 2015-08-09 DIAGNOSIS — M47816 Spondylosis without myelopathy or radiculopathy, lumbar region: Secondary | ICD-10-CM | POA: Diagnosis not present

## 2015-08-09 DIAGNOSIS — I110 Hypertensive heart disease with heart failure: Secondary | ICD-10-CM | POA: Diagnosis not present

## 2015-08-09 DIAGNOSIS — I509 Heart failure, unspecified: Secondary | ICD-10-CM | POA: Diagnosis not present

## 2015-08-10 ENCOUNTER — Telehealth: Payer: Self-pay | Admitting: *Deleted

## 2015-08-10 MED ORDER — METOPROLOL TARTRATE 25 MG PO TABS: 12.5000 mg | ORAL_TABLET | Freq: Two times a day (BID) | ORAL | Status: DC

## 2015-08-10 NOTE — Telephone Encounter (Signed)
-----   Message from Skeet Latch, MD sent at 08/09/2015  5:24 PM EST ----- Monitor shows PVCs and PACs that are likely the ccause of her palpitations.  These are not dangerous.  Start metoprolol 12.5 mg twice daily.  If she becomes dizzy or BP is low, can decrease clonidine to 0.2mg .   Keep appointment 12/29.

## 2015-08-10 NOTE — Telephone Encounter (Signed)
Left message to call back  prescription sent pharmacy

## 2015-08-10 NOTE — Telephone Encounter (Signed)
Spoke to patient.   Monitor Result given . Verbalized understanding Patient wanted to reschedule appointment in dec 2016 to jan 2017. Reschedule done.

## 2015-08-15 DIAGNOSIS — K589 Irritable bowel syndrome without diarrhea: Secondary | ICD-10-CM | POA: Diagnosis not present

## 2015-08-15 DIAGNOSIS — I110 Hypertensive heart disease with heart failure: Secondary | ICD-10-CM | POA: Diagnosis not present

## 2015-08-15 DIAGNOSIS — I509 Heart failure, unspecified: Secondary | ICD-10-CM | POA: Diagnosis not present

## 2015-08-15 DIAGNOSIS — M4316 Spondylolisthesis, lumbar region: Secondary | ICD-10-CM | POA: Diagnosis not present

## 2015-08-15 DIAGNOSIS — M47816 Spondylosis without myelopathy or radiculopathy, lumbar region: Secondary | ICD-10-CM | POA: Diagnosis not present

## 2015-08-15 DIAGNOSIS — M4806 Spinal stenosis, lumbar region: Secondary | ICD-10-CM | POA: Diagnosis not present

## 2015-08-17 ENCOUNTER — Ambulatory Visit: Payer: Medicare Other | Admitting: Cardiovascular Disease

## 2015-08-17 DIAGNOSIS — I110 Hypertensive heart disease with heart failure: Secondary | ICD-10-CM | POA: Diagnosis not present

## 2015-08-17 DIAGNOSIS — M4316 Spondylolisthesis, lumbar region: Secondary | ICD-10-CM | POA: Diagnosis not present

## 2015-08-17 DIAGNOSIS — M47816 Spondylosis without myelopathy or radiculopathy, lumbar region: Secondary | ICD-10-CM | POA: Diagnosis not present

## 2015-08-17 DIAGNOSIS — I509 Heart failure, unspecified: Secondary | ICD-10-CM | POA: Diagnosis not present

## 2015-08-17 DIAGNOSIS — K589 Irritable bowel syndrome without diarrhea: Secondary | ICD-10-CM | POA: Diagnosis not present

## 2015-08-17 DIAGNOSIS — M4806 Spinal stenosis, lumbar region: Secondary | ICD-10-CM | POA: Diagnosis not present

## 2015-08-23 DIAGNOSIS — I110 Hypertensive heart disease with heart failure: Secondary | ICD-10-CM | POA: Diagnosis not present

## 2015-08-23 DIAGNOSIS — K589 Irritable bowel syndrome without diarrhea: Secondary | ICD-10-CM | POA: Diagnosis not present

## 2015-08-23 DIAGNOSIS — I509 Heart failure, unspecified: Secondary | ICD-10-CM | POA: Diagnosis not present

## 2015-08-23 DIAGNOSIS — M47816 Spondylosis without myelopathy or radiculopathy, lumbar region: Secondary | ICD-10-CM | POA: Diagnosis not present

## 2015-08-23 DIAGNOSIS — M4806 Spinal stenosis, lumbar region: Secondary | ICD-10-CM | POA: Diagnosis not present

## 2015-08-23 DIAGNOSIS — M4316 Spondylolisthesis, lumbar region: Secondary | ICD-10-CM | POA: Diagnosis not present

## 2015-08-29 ENCOUNTER — Ambulatory Visit: Payer: Medicare Other | Admitting: Cardiovascular Disease

## 2015-08-29 DIAGNOSIS — M47816 Spondylosis without myelopathy or radiculopathy, lumbar region: Secondary | ICD-10-CM | POA: Diagnosis not present

## 2015-08-29 DIAGNOSIS — M4316 Spondylolisthesis, lumbar region: Secondary | ICD-10-CM | POA: Diagnosis not present

## 2015-08-29 DIAGNOSIS — K589 Irritable bowel syndrome without diarrhea: Secondary | ICD-10-CM | POA: Diagnosis not present

## 2015-08-29 DIAGNOSIS — I509 Heart failure, unspecified: Secondary | ICD-10-CM | POA: Diagnosis not present

## 2015-08-29 DIAGNOSIS — I110 Hypertensive heart disease with heart failure: Secondary | ICD-10-CM | POA: Diagnosis not present

## 2015-08-29 DIAGNOSIS — M4806 Spinal stenosis, lumbar region: Secondary | ICD-10-CM | POA: Diagnosis not present

## 2015-08-30 ENCOUNTER — Telehealth: Payer: Self-pay

## 2015-08-30 NOTE — Telephone Encounter (Signed)
Home health orders faxed to [facility].  

## 2015-09-05 DIAGNOSIS — M47816 Spondylosis without myelopathy or radiculopathy, lumbar region: Secondary | ICD-10-CM | POA: Diagnosis not present

## 2015-09-05 DIAGNOSIS — M4316 Spondylolisthesis, lumbar region: Secondary | ICD-10-CM | POA: Diagnosis not present

## 2015-09-05 DIAGNOSIS — M4806 Spinal stenosis, lumbar region: Secondary | ICD-10-CM | POA: Diagnosis not present

## 2015-09-05 DIAGNOSIS — K589 Irritable bowel syndrome without diarrhea: Secondary | ICD-10-CM | POA: Diagnosis not present

## 2015-09-05 DIAGNOSIS — I110 Hypertensive heart disease with heart failure: Secondary | ICD-10-CM | POA: Diagnosis not present

## 2015-09-05 DIAGNOSIS — I509 Heart failure, unspecified: Secondary | ICD-10-CM | POA: Diagnosis not present

## 2015-09-11 DIAGNOSIS — M4804 Spinal stenosis, thoracic region: Secondary | ICD-10-CM | POA: Diagnosis not present

## 2015-09-11 DIAGNOSIS — Z6841 Body Mass Index (BMI) 40.0 and over, adult: Secondary | ICD-10-CM | POA: Diagnosis not present

## 2015-09-11 DIAGNOSIS — M4316 Spondylolisthesis, lumbar region: Secondary | ICD-10-CM | POA: Diagnosis not present

## 2015-09-14 DIAGNOSIS — M47816 Spondylosis without myelopathy or radiculopathy, lumbar region: Secondary | ICD-10-CM | POA: Diagnosis not present

## 2015-09-14 DIAGNOSIS — I110 Hypertensive heart disease with heart failure: Secondary | ICD-10-CM | POA: Diagnosis not present

## 2015-09-14 DIAGNOSIS — I509 Heart failure, unspecified: Secondary | ICD-10-CM | POA: Diagnosis not present

## 2015-09-14 DIAGNOSIS — M4316 Spondylolisthesis, lumbar region: Secondary | ICD-10-CM | POA: Diagnosis not present

## 2015-09-14 DIAGNOSIS — K589 Irritable bowel syndrome without diarrhea: Secondary | ICD-10-CM | POA: Diagnosis not present

## 2015-09-14 DIAGNOSIS — M4806 Spinal stenosis, lumbar region: Secondary | ICD-10-CM | POA: Diagnosis not present
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
## 2015-09-15 ENCOUNTER — Ambulatory Visit (INDEPENDENT_AMBULATORY_CARE_PROVIDER_SITE_OTHER): Payer: Medicare Other | Admitting: Cardiovascular Disease

## 2015-09-15 ENCOUNTER — Encounter: Payer: Self-pay | Admitting: Cardiovascular Disease

## 2015-09-15 VITALS — BP 130/82 | HR 60 | Ht 63.0 in | Wt 264.0 lb

## 2015-09-15 DIAGNOSIS — I10 Essential (primary) hypertension: Secondary | ICD-10-CM

## 2015-09-15 DIAGNOSIS — E785 Hyperlipidemia, unspecified: Secondary | ICD-10-CM

## 2015-09-15 DIAGNOSIS — R002 Palpitations: Secondary | ICD-10-CM | POA: Diagnosis not present

## 2015-09-15 DIAGNOSIS — I491 Atrial premature depolarization: Secondary | ICD-10-CM

## 2015-09-15 DIAGNOSIS — I5032 Chronic diastolic (congestive) heart failure: Secondary | ICD-10-CM | POA: Diagnosis not present

## 2015-09-15 DIAGNOSIS — I493 Ventricular premature depolarization: Secondary | ICD-10-CM

## 2015-09-15 MED ORDER — METOPROLOL TARTRATE 25 MG PO TABS: 12.5000 mg | ORAL_TABLET | Freq: Two times a day (BID) | ORAL | Status: DC

## 2015-09-15 NOTE — Patient Instructions (Signed)
NO CHANGE IN CURRENT MEDICATIONS   MEDICATION REFILLED 90 DAY SUPPLY FOR 3 REFILL  Your physician wants you to follow-up in [place].  You will receive a reminder letter in the mail two months in advance. If you don't receive a letter, please call our office to schedule the follow-up appointment.   If you need a refill on your cardiac medications before your next appointment, please call your pharmacy.

## 2015-09-22 DIAGNOSIS — M4804 Spinal stenosis, thoracic region: Secondary | ICD-10-CM | POA: Diagnosis not present

## 2015-09-22 DIAGNOSIS — M47814 Spondylosis without myelopathy or radiculopathy, thoracic region: Secondary | ICD-10-CM | POA: Diagnosis not present

## 2015-10-02 ENCOUNTER — Other Ambulatory Visit (HOSPITAL_COMMUNITY): Payer: Self-pay | Admitting: Neurological Surgery

## 2015-10-02 DIAGNOSIS — M4316 Spondylolisthesis, lumbar region: Secondary | ICD-10-CM | POA: Diagnosis not present

## 2015-10-02 DIAGNOSIS — Z6841 Body Mass Index (BMI) 40.0 and over, adult: Secondary | ICD-10-CM | POA: Diagnosis not present

## 2015-10-02 DIAGNOSIS — M4804 Spinal stenosis, thoracic region: Secondary | ICD-10-CM | POA: Diagnosis not present

## 2015-10-09 ENCOUNTER — Telehealth: Payer: Self-pay | Admitting: *Deleted

## 2015-10-09 NOTE — Telephone Encounter (Addendum)
Request for surgical clearance:  1. What type of surgery is being performed? LUMBAR FUSION/LAMINECTOMY, UNDER GENERAL ANESTHESIA  2. When is this surgery scheduled? MARCH 8,2017   3. Are there any medications that need to be held prior to surgery and how long?    ?  4. Name of physician performing surgery?  Dr Eustace Moore, MD  5. What is your office phone and fax number?  FAX [phone]   ,PHONE [phone]  EXT [facility]

## 2015-10-11 NOTE — Telephone Encounter (Signed)
FORWARD TO DR Ronnald Ramp  OFFICE  [name]

## 2015-10-11 NOTE — Telephone Encounter (Signed)
[name] is low risk for back surgery  She is not on any blood thinners.

## 2015-10-17 ENCOUNTER — Encounter (HOSPITAL_COMMUNITY)
Admission: RE | Admit: 2015-10-17 | Discharge: 2015-10-17 | Disposition: A | Discharge status: Home / Self Care | Payer: Medicare Other | Source: Ambulatory Visit | Attending: Neurological Surgery | Admitting: Neurological Surgery

## 2015-10-17 ENCOUNTER — Encounter (HOSPITAL_COMMUNITY): Payer: Self-pay

## 2015-10-17 DIAGNOSIS — Z96653 Presence of artificial knee joint, bilateral: Secondary | ICD-10-CM | POA: Insufficient documentation

## 2015-10-17 DIAGNOSIS — E7800 Pure hypercholesterolemia, unspecified: Secondary | ICD-10-CM | POA: Diagnosis not present

## 2015-10-17 DIAGNOSIS — Z0183 Encounter for blood typing: Secondary | ICD-10-CM | POA: Diagnosis not present

## 2015-10-17 DIAGNOSIS — K589 Irritable bowel syndrome without diarrhea: Secondary | ICD-10-CM | POA: Diagnosis not present

## 2015-10-17 DIAGNOSIS — Z01818 Encounter for other preprocedural examination: Secondary | ICD-10-CM | POA: Diagnosis not present

## 2015-10-17 DIAGNOSIS — K219 Gastro-esophageal reflux disease without esophagitis: Secondary | ICD-10-CM | POA: Insufficient documentation

## 2015-10-17 DIAGNOSIS — R911 Solitary pulmonary nodule: Secondary | ICD-10-CM | POA: Diagnosis not present

## 2015-10-17 DIAGNOSIS — Z01812 Encounter for preprocedural laboratory examination: Secondary | ICD-10-CM | POA: Diagnosis not present

## 2015-10-17 DIAGNOSIS — I5030 Unspecified diastolic (congestive) heart failure: Secondary | ICD-10-CM | POA: Diagnosis not present

## 2015-10-17 DIAGNOSIS — M4310 Spondylolisthesis, site unspecified: Secondary | ICD-10-CM | POA: Insufficient documentation

## 2015-10-17 DIAGNOSIS — I110 Hypertensive heart disease with heart failure: Secondary | ICD-10-CM | POA: Insufficient documentation

## 2015-10-17 HISTORY — DX: Palpitations: R00.2

## 2015-10-17 LAB — CBC WITH DIFFERENTIAL/PLATELET
BASOS ABS: 0 10*3/uL (ref 0.0–0.1)
Basophils Relative: 0 %
EOS PCT: 2 %
Eosinophils Absolute: 0.1 10*3/uL (ref 0.0–0.7)
HCT: 43.2 % (ref 36.0–46.0)
Hemoglobin: 14.5 g/dL (ref 12.0–15.0)
LYMPHS PCT: 42 %
Lymphs Abs: 1.8 10*3/uL (ref 0.7–4.0)
MCH: 29.8 pg (ref 26.0–34.0)
MCHC: 33.6 g/dL (ref 30.0–36.0)
MCV: 88.9 fL (ref 78.0–100.0)
MONO ABS: 0.6 10*3/uL (ref 0.1–1.0)
MONOS PCT: 13 %
Neutro Abs: 1.9 10*3/uL (ref 1.7–7.7)
Neutrophils Relative %: 43 %
PLATELETS: 163 10*3/uL (ref 150–400)
RBC: 4.86 MIL/uL (ref 3.87–5.11)
RDW: 13.1 % (ref 11.5–15.5)
WBC: 4.3 10*3/uL (ref 4.0–10.5)

## 2015-10-17 LAB — BASIC METABOLIC PANEL
Anion gap: 10 (ref 5–15)
BUN: 11 mg/dL (ref 6–20)
CO2: 29 mmol/L (ref 22–32)
Calcium: 9.3 mg/dL (ref 8.9–10.3)
Chloride: 100 mmol/L — ABNORMAL LOW (ref 101–111)
Creatinine, Ser: 1.29 mg/dL — ABNORMAL HIGH (ref 0.44–1.00)
GFR calc Af Amer: 49 mL/min — ABNORMAL LOW (ref >60)
GFR, EST NON AFRICAN AMERICAN: 42 mL/min — AB (ref >60)
GLUCOSE: 106 mg/dL — AB (ref 65–99)
POTASSIUM: 3.5 mmol/L (ref 3.5–5.1)
Sodium: 139 mmol/L (ref 135–145)

## 2015-10-17 LAB — PROTIME-INR
INR: 1.08 (ref 0.00–1.49)
Prothrombin Time: 14.2 seconds (ref 11.6–15.2)

## 2015-10-17 LAB — TYPE AND SCREEN
ABO/RH(D): B POS
Antibody Screen: NEGATIVE

## 2015-10-17 LAB — SURGICAL PCR SCREEN
MRSA, PCR: NEGATIVE
STAPHYLOCOCCUS AUREUS: NEGATIVE

## 2015-10-17 NOTE — Pre-Procedure Instructions (Signed)
    [name]  10/17/2015      [facility] - [place], [place] - [address] [place] [address] Phone: [phone] Fax: [phone]  [facility]- [place], [place] - [address] [place] [address] Phone: [phone] Fax: [phone]    Your procedure is scheduled on Wednesday, March 8th, 2017.  Report to [facility] Admitting at 9:30 A.M.   Call this number if you have problems the morning of surgery:  [phone]   Remember:  Do not eat food or drink liquids after midnight.   Take these medicines the morning of surgery with A SIP OF WATER: Albuterol inhaler if needed (please bring with you), Albuterol nebulizer if needed, Citalopram (Celexa), Lomotil if needed, Eluxadoline (Viberzi), Esomeprazole Magnesium (Nexium), Metoprolol Tartrate (Lopressor), Oxycodone if needed, Tramadol (Ultram) if needed.  Stop taking: Aspirin, NSAIDS, Aleve, Naproxen, Ibuprofen, Advil, Motrin, BC's, Goody's, Fish oil, all herbal medications, and all vitamins.    Do not wear jewelry, make-up or nail polish.  Do not wear lotions, powders, or perfumes.  You may NOT wear deodorant.  Do not shave 48 hours prior to surgery.   Do not bring valuables to the hospital.   [facility] is not responsible for any belongings or valuables.  Contacts, dentures or bridgework may not be worn into surgery.  Leave your suitcase in the car.  After surgery it may be brought to your room.  For patients admitted to the hospital, discharge time will be determined by your treatment team.  Patients discharged the day of surgery will not be allowed to drive home.   Special instructions:  See attached.   Please read over the following fact sheets that you were given. Pain Booklet, Coughing and Deep Breathing, Blood Transfusion Information, MRSA Information and Surgical Site Infection  Prevention

## 2015-10-17 NOTE — Progress Notes (Signed)
PCP - Dr. Nolene Ebbs Cardiologist - Dr. Skeet Latch    -cardiac clearance in chart  EKG - 07/07/15 CXR - 10/17/15  Echo-  05/2015 - Epic Stress test - 05/2015 - Epic Cardiac Cath - denies  Patient denies chest pain and shortness of breath at PAT appointment.

## 2015-10-18 ENCOUNTER — Encounter (HOSPITAL_COMMUNITY): Payer: Self-pay

## 2015-10-18 NOTE — Progress Notes (Addendum)
Anesthesia chart review: Patient is a 65 year old female scheduled for MAS, PLIF L4-5, laminectomy L2-3,T10-11, T11-12 on 3/38/17 by Dr. Ronnald Ramp.  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 She reported she woke up during cyst surgery on her back. BMI is consistent with morbid obesity.   PCP is Dr. Nolene Ebbs. Cardiologist is Dr. Skeet Latch who cleared patient for surgery with low risk.   07/07/15 EKG: NSR, low voltage QRS, cannot rule out anterior infarct (age undetermined).  05/25/15 Nuclear stress test:  Nuclear stress EF: 58%.  There was no ST segment deviation noted during stress.  The study is normal.  This is a low risk study.  The left ventricular ejection fraction is normal (55-65%). Normal perfusion. No ischemia or scar.  05/24/15 Echo: Study Conclusions - Left ventricle: The cavity size was normal. Wall thickness was normal. The estimated ejection fraction was 50%. Diffuse hypokinesis. Doppler parameters are consistent with abnormal left ventricular relaxation (grade 1 diastolic dysfunction). - Aortic valve: There was no stenosis. - Mitral valve: Mildly calcified annulus. Normal thickness leaflets. There was no significant regurgitation. - Right ventricle: The cavity size was normal. Systolic function was normal. - Tricuspid valve: Peak RV-RA gradient (S): 16 mm Hg. - Pulmonary arteries: PA peak pressure: 19 mm Hg (S). - Inferior vena cava: The vessel was normal in size. The respirophasic diameter changes were in the normal range (>= 50%), consistent with normal central venous pressure. Impressions: - Normal LV size with mild diffuse hypokinesis, EF 50%. Normal RV  size and systolic function. No significant valvular abnormalities.  06/2015 48 Hour Holter Monitor Quality: Good Predominant rhythm: sinus rhythm, sinus arrhythmia Average heart rate: 72 bpm Max heart rate: 136 bpm Min heart rate: 52 bpm Pauses >2.5 seconds: 0 Ventricular ectopics: 253 (251 isolated)  Morphology: monomorphic 58 PACs (51 isolated, 2 short runs) Patient did not submit a symptom diary  09/18/06 Cardiac cath (Dr. Quay Burow): SELECTIVE CORONARY ANGIOGRAPHY: 1. Left main normal. 2. LAD normal. 3. Ramus intermedius branch was moderate in size and normal. 4. The circumflex was codominant and normal. 5. Right coronary was codominant and normal. 6. Left ventriculography: The overall LVEF was estimated at greater than 60% with nonfocal wall motion abnormalities. 7. Distal abdominal aortogram:The renal arteries were widely patent. Renal abdominal aorta and iliac bifurcation were free of significant atherosclerotic changes. IMPRESSION: Ms. Fearrington has essentially normal coronaries, normal left ventricular function, and normal renal arteries. I believe her Myoview was false positive secondary to breast attenuation artifact and her high blood pressure is essential.  10/17/15 CXR: IMPRESSION: 1. Calcified left lower lobe pulmonary nodule most likely a granuloma. 2. No acute abnormality.  Preoperative labs noted.   If no acute changes then I anticipate that she can proceed as planned. Since she had to be reintubated following her [facility], I did request her anesthesia/PACU records from [facility] to have on her chart for her anesthesiologist to review as needed. Records indicate that "It took a very long time to get her awake to extubate and then in PACU she became somnolent again and was not breathing adequately. Tried Narcan 0.2 mg 2 unsuccessfully so we intubated and put her on vent for the night." A printed record of her intra-operative report and medications was placed inside her  bedside  chart.    George Hugh [facility] Short Stay Center/Anesthesiology Phone [phone] 10/18/2015 5:21 PM

## 2015-10-24 MED ORDER — DEXAMETHASONE SODIUM PHOSPHATE 10 MG/ML IJ SOLN
10.0000 mg | INTRAMUSCULAR | Status: AC
  Administered 2015-10-25: 10 mg via INTRAVENOUS
  Filled 2015-10-24: qty 1

## 2015-10-24 MED ORDER — DEXTROSE 5 % IV SOLN
3.0000 g | INTRAVENOUS | Status: AC
  Administered 2015-10-25: 3 g via INTRAVENOUS
  Filled 2015-10-24: qty 3000

## 2015-10-25 ENCOUNTER — Encounter (HOSPITAL_COMMUNITY)
Admission: RE | Disposition: A | Discharge status: Home Health | Payer: Self-pay | Source: Ambulatory Visit | Attending: Neurological Surgery

## 2015-10-25 ENCOUNTER — Inpatient Hospital Stay (HOSPITAL_COMMUNITY): Payer: Medicare Other

## 2015-10-25 ENCOUNTER — Inpatient Hospital Stay (HOSPITAL_COMMUNITY): Payer: Medicare Other | Admitting: Vascular Surgery

## 2015-10-25 ENCOUNTER — Inpatient Hospital Stay (HOSPITAL_COMMUNITY): Payer: Medicare Other | Admitting: Certified Registered"

## 2015-10-25 ENCOUNTER — Encounter (HOSPITAL_COMMUNITY): Payer: Self-pay | Admitting: Certified Registered"

## 2015-10-25 ENCOUNTER — Inpatient Hospital Stay (HOSPITAL_COMMUNITY)
Admission: RE | Admit: 2015-10-25 | Discharge: 2015-10-30 | DRG: 460 | Disposition: A | Discharge status: Home Health | Payer: Medicare Other | Source: Ambulatory Visit | Attending: Neurological Surgery | Admitting: Neurological Surgery

## 2015-10-25 DIAGNOSIS — R002 Palpitations: Secondary | ICD-10-CM | POA: Diagnosis not present

## 2015-10-25 DIAGNOSIS — Z981 Arthrodesis status: Secondary | ICD-10-CM

## 2015-10-25 DIAGNOSIS — M4806 Spinal stenosis, lumbar region: Secondary | ICD-10-CM | POA: Diagnosis not present

## 2015-10-25 DIAGNOSIS — Z96611 Presence of right artificial shoulder joint: Secondary | ICD-10-CM | POA: Diagnosis present

## 2015-10-25 DIAGNOSIS — Z96652 Presence of left artificial knee joint: Secondary | ICD-10-CM | POA: Diagnosis present

## 2015-10-25 DIAGNOSIS — I10 Essential (primary) hypertension: Secondary | ICD-10-CM | POA: Diagnosis not present

## 2015-10-25 DIAGNOSIS — Z419 Encounter for procedure for purposes other than remedying health state, unspecified: Secondary | ICD-10-CM

## 2015-10-25 DIAGNOSIS — R509 Fever, unspecified: Secondary | ICD-10-CM

## 2015-10-25 DIAGNOSIS — M4804 Spinal stenosis, thoracic region: Secondary | ICD-10-CM | POA: Diagnosis not present

## 2015-10-25 DIAGNOSIS — K219 Gastro-esophageal reflux disease without esophagitis: Secondary | ICD-10-CM | POA: Diagnosis not present

## 2015-10-25 DIAGNOSIS — Z79899 Other long term (current) drug therapy: Secondary | ICD-10-CM | POA: Diagnosis not present

## 2015-10-25 DIAGNOSIS — I509 Heart failure, unspecified: Secondary | ICD-10-CM | POA: Diagnosis not present

## 2015-10-25 DIAGNOSIS — Z96651 Presence of right artificial knee joint: Secondary | ICD-10-CM | POA: Diagnosis present

## 2015-10-25 DIAGNOSIS — M4316 Spondylolisthesis, lumbar region: Principal | ICD-10-CM | POA: Diagnosis present

## 2015-10-25 DIAGNOSIS — M4326 Fusion of spine, lumbar region: Secondary | ICD-10-CM | POA: Diagnosis not present

## 2015-10-25 DIAGNOSIS — M545 Low back pain: Secondary | ICD-10-CM | POA: Diagnosis present

## 2015-10-25 DIAGNOSIS — M4805 Spinal stenosis, thoracolumbar region: Secondary | ICD-10-CM | POA: Diagnosis not present

## 2015-10-25 DIAGNOSIS — E7800 Pure hypercholesterolemia, unspecified: Secondary | ICD-10-CM | POA: Diagnosis present

## 2015-10-25 DIAGNOSIS — K589 Irritable bowel syndrome without diarrhea: Secondary | ICD-10-CM | POA: Diagnosis not present

## 2015-10-25 HISTORY — PX: MAXIMUM ACCESS (MAS)POSTERIOR LUMBAR INTERBODY FUSION (PLIF) 1 LEVEL: SHX6368

## 2015-10-25 HISTORY — PX: LUMBAR LAMINECTOMY/DECOMPRESSION MICRODISCECTOMY: SHX5026

## 2015-10-25 SURGERY — FOR MAXIMUM ACCESS (MAS) POSTERIOR LUMBAR INTERBODY FUSION (PLIF) 1 LEVEL
Anesthesia: General | Site: Spine Lumbar

## 2015-10-25 MED ORDER — ALBUTEROL SULFATE HFA 108 (90 BASE) MCG/ACT IN AERS: 2.0000 | INHALATION_SPRAY | Freq: Four times a day (QID) | RESPIRATORY_TRACT | Status: DC | PRN

## 2015-10-25 MED ORDER — FENTANYL CITRATE (PF) 100 MCG/2ML IJ SOLN
INTRAMUSCULAR | Status: AC
  Filled 2015-10-25: qty 2

## 2015-10-25 MED ORDER — ONDANSETRON HCL 4 MG/2ML IJ SOLN
4.0000 mg | INTRAMUSCULAR | Status: DC | PRN
  Administered 2015-10-26: 4 mg via INTRAVENOUS
  Filled 2015-10-25: qty 2

## 2015-10-25 MED ORDER — METHOCARBAMOL 500 MG PO TABS
500.0000 mg | ORAL_TABLET | Freq: Four times a day (QID) | ORAL | Status: DC | PRN
  Administered 2015-10-25 – 2015-10-30 (×8): 500 mg via ORAL
  Filled 2015-10-25 (×8): qty 1

## 2015-10-25 MED ORDER — MIDAZOLAM HCL 2 MG/2ML IJ SOLN
INTRAMUSCULAR | Status: AC
  Filled 2015-10-25: qty 2

## 2015-10-25 MED ORDER — LIDOCAINE HCL (CARDIAC) 20 MG/ML IV SOLN
INTRAVENOUS | Status: DC | PRN
  Administered 2015-10-25: 60 mg via INTRAVENOUS

## 2015-10-25 MED ORDER — PHENYLEPHRINE HCL 10 MG/ML IJ SOLN
10.0000 mg | INTRAVENOUS | Status: DC | PRN
  Administered 2015-10-25: 25 ug/min via INTRAVENOUS

## 2015-10-25 MED ORDER — SODIUM CHLORIDE 0.9 % IR SOLN
Status: DC | PRN
  Administered 2015-10-25: 13:00:00

## 2015-10-25 MED ORDER — ALBUTEROL SULFATE (2.5 MG/3ML) 0.083% IN NEBU
2.5000 mg | INHALATION_SOLUTION | Freq: Four times a day (QID) | RESPIRATORY_TRACT | Status: DC | PRN
  Administered 2015-10-27: 2.5 mg via RESPIRATORY_TRACT
  Filled 2015-10-25: qty 3

## 2015-10-25 MED ORDER — ACETAMINOPHEN 10 MG/ML IV SOLN
INTRAVENOUS | Status: AC
  Administered 2015-10-25: 1000 mg via INTRAVENOUS
  Filled 2015-10-25: qty 100

## 2015-10-25 MED ORDER — ONDANSETRON HCL 4 MG/2ML IJ SOLN
INTRAMUSCULAR | Status: DC | PRN
  Administered 2015-10-25: 4 mg via INTRAVENOUS

## 2015-10-25 MED ORDER — FENTANYL CITRATE (PF) 250 MCG/5ML IJ SOLN
INTRAMUSCULAR | Status: AC
  Filled 2015-10-25: qty 5

## 2015-10-25 MED ORDER — CLONIDINE HCL 0.3 MG/24HR TD PTWK
0.3000 mg | MEDICATED_PATCH | TRANSDERMAL | Status: DC
  Administered 2015-10-26: 0.3 mg via TRANSDERMAL
  Filled 2015-10-25 (×2): qty 1

## 2015-10-25 MED ORDER — MIDAZOLAM HCL 5 MG/5ML IJ SOLN
INTRAMUSCULAR | Status: DC | PRN
  Administered 2015-10-25: 2 mg via INTRAVENOUS

## 2015-10-25 MED ORDER — HYDROCHLOROTHIAZIDE 12.5 MG PO CAPS
12.5000 mg | ORAL_CAPSULE | Freq: Every day | ORAL | Status: DC
  Administered 2015-10-25 – 2015-10-30 (×6): 12.5 mg via ORAL
  Filled 2015-10-25 (×6): qty 1

## 2015-10-25 MED ORDER — MEPERIDINE HCL 25 MG/ML IJ SOLN: 6.2500 mg | INTRAMUSCULAR | Status: DC | PRN

## 2015-10-25 MED ORDER — SODIUM CHLORIDE 0.9 % IV SOLN: 250.0000 mL | INTRAVENOUS | Status: DC

## 2015-10-25 MED ORDER — LOSARTAN POTASSIUM-HCTZ 100-12.5 MG PO TABS: 1.0000 | ORAL_TABLET | Freq: Every day | ORAL | Status: DC

## 2015-10-25 MED ORDER — CEFAZOLIN SODIUM 1-5 GM-% IV SOLN
1.0000 g | Freq: Three times a day (TID) | INTRAVENOUS | Status: AC
  Administered 2015-10-25 – 2015-10-26 (×2): 1 g via INTRAVENOUS
  Filled 2015-10-25 (×2): qty 50

## 2015-10-25 MED ORDER — PROPOFOL 10 MG/ML IV BOLUS
INTRAVENOUS | Status: DC | PRN
  Administered 2015-10-25 (×2): 50 mg via INTRAVENOUS
  Administered 2015-10-25: 150 mg via INTRAVENOUS

## 2015-10-25 MED ORDER — LACTATED RINGERS IV SOLN
INTRAVENOUS | Status: DC | PRN
  Administered 2015-10-25 (×2): via INTRAVENOUS

## 2015-10-25 MED ORDER — THROMBIN 5000 UNITS EX SOLR
OROMUCOSAL | Status: DC | PRN
  Administered 2015-10-25: 13:00:00 via TOPICAL

## 2015-10-25 MED ORDER — PHENOL 1.4 % MT LIQD: 1.0000 | OROMUCOSAL | Status: DC | PRN

## 2015-10-25 MED ORDER — VITAMIN D (ERGOCALCIFEROL) 1.25 MG (50000 UNIT) PO CAPS: 50000.0000 [IU] | ORAL_CAPSULE | ORAL | Status: DC

## 2015-10-25 MED ORDER — BUPIVACAINE HCL (PF) 0.25 % IJ SOLN
INTRAMUSCULAR | Status: DC | PRN
  Administered 2015-10-25: 5 mL

## 2015-10-25 MED ORDER — VANCOMYCIN HCL 1000 MG IV SOLR
INTRAVENOUS | Status: AC
  Filled 2015-10-25: qty 1000

## 2015-10-25 MED ORDER — PROPOFOL 500 MG/50ML IV EMUL
INTRAVENOUS | Status: DC | PRN
  Administered 2015-10-25: 35 ug/kg/min via INTRAVENOUS

## 2015-10-25 MED ORDER — 0.9 % SODIUM CHLORIDE (POUR BTL) OPTIME
TOPICAL | Status: DC | PRN
  Administered 2015-10-25: 1000 mL

## 2015-10-25 MED ORDER — GLYCOPYRROLATE 0.2 MG/ML IJ SOLN
INTRAMUSCULAR | Status: DC | PRN
  Administered 2015-10-25: 0.2 mg via INTRAVENOUS
  Administered 2015-10-25: .2 mg via INTRAVENOUS

## 2015-10-25 MED ORDER — PROMETHAZINE HCL 25 MG/ML IJ SOLN: 6.2500 mg | INTRAMUSCULAR | Status: DC | PRN

## 2015-10-25 MED ORDER — LACTATED RINGERS IV SOLN
Freq: Once | INTRAVENOUS | Status: AC
  Administered 2015-10-25: 09:00:00 via INTRAVENOUS

## 2015-10-25 MED ORDER — VANCOMYCIN HCL POWD
Status: DC | PRN
  Administered 2015-10-25: 1000 mg via SURGICAL_CAVITY

## 2015-10-25 MED ORDER — METHOCARBAMOL 500 MG PO TABS
ORAL_TABLET | ORAL | Status: AC
  Administered 2015-10-26: 500 mg via ORAL
  Filled 2015-10-25: qty 1

## 2015-10-25 MED ORDER — SODIUM CHLORIDE 0.9% FLUSH
3.0000 mL | Freq: Two times a day (BID) | INTRAVENOUS | Status: DC
  Administered 2015-10-26 – 2015-10-29 (×4): 3 mL via INTRAVENOUS

## 2015-10-25 MED ORDER — ONDANSETRON HCL 4 MG/2ML IJ SOLN
INTRAMUSCULAR | Status: AC
  Filled 2015-10-25: qty 2

## 2015-10-25 MED ORDER — POTASSIUM CHLORIDE IN NACL 20-0.9 MEQ/L-% IV SOLN
INTRAVENOUS | Status: DC
  Administered 2015-10-25: 19:00:00 via INTRAVENOUS
  Filled 2015-10-25 (×7): qty 1000

## 2015-10-25 MED ORDER — OXYCODONE HCL 5 MG PO TABS
10.0000 mg | ORAL_TABLET | ORAL | Status: DC | PRN
  Administered 2015-10-25 – 2015-10-30 (×16): 10 mg via ORAL
  Filled 2015-10-25 (×15): qty 2

## 2015-10-25 MED ORDER — MENTHOL 3 MG MT LOZG
1.0000 | LOZENGE | OROMUCOSAL | Status: DC | PRN
  Administered 2015-10-26: 3 mg via ORAL
  Filled 2015-10-25: qty 9

## 2015-10-25 MED ORDER — THROMBIN 20000 UNITS EX SOLR
CUTANEOUS | Status: DC | PRN
  Administered 2015-10-25: 13:00:00 via TOPICAL

## 2015-10-25 MED ORDER — MORPHINE SULFATE (PF) 2 MG/ML IV SOLN
1.0000 mg | INTRAVENOUS | Status: DC | PRN
  Administered 2015-10-25 – 2015-10-26 (×4): 2 mg via INTRAVENOUS
  Administered 2015-10-26: 4 mg via INTRAVENOUS
  Administered 2015-10-26 – 2015-10-28 (×3): 2 mg via INTRAVENOUS
  Administered 2015-10-28: 4 mg via INTRAVENOUS
  Filled 2015-10-25: qty 1
  Filled 2015-10-25: qty 2
  Filled 2015-10-25 (×7): qty 1
  Filled 2015-10-25: qty 2
  Filled 2015-10-25: qty 1

## 2015-10-25 MED ORDER — EPHEDRINE SULFATE 50 MG/ML IJ SOLN
INTRAMUSCULAR | Status: DC | PRN
  Administered 2015-10-25: 10 mg via INTRAVENOUS

## 2015-10-25 MED ORDER — ELUXADOLINE 75 MG PO TABS: 75.0000 mg | ORAL_TABLET | Freq: Two times a day (BID) | ORAL | Status: DC

## 2015-10-25 MED ORDER — KETOROLAC TROMETHAMINE 30 MG/ML IJ SOLN
INTRAMUSCULAR | Status: DC | PRN
  Administered 2015-10-25: 30 mg via INTRAVENOUS

## 2015-10-25 MED ORDER — ACETAMINOPHEN 325 MG PO TABS
650.0000 mg | ORAL_TABLET | ORAL | Status: DC | PRN
  Administered 2015-10-26 – 2015-10-28 (×4): 650 mg via ORAL
  Filled 2015-10-25 (×4): qty 2

## 2015-10-25 MED ORDER — PANTOPRAZOLE SODIUM 40 MG PO TBEC
40.0000 mg | DELAYED_RELEASE_TABLET | Freq: Every day | ORAL | Status: DC
  Administered 2015-10-26: 40 mg via ORAL
  Filled 2015-10-25: qty 1

## 2015-10-25 MED ORDER — FENTANYL CITRATE (PF) 100 MCG/2ML IJ SOLN
25.0000 ug | INTRAMUSCULAR | Status: DC | PRN
  Administered 2015-10-25 (×3): 50 ug via INTRAVENOUS

## 2015-10-25 MED ORDER — ACETAMINOPHEN 650 MG RE SUPP: 650.0000 mg | RECTAL | Status: DC | PRN

## 2015-10-25 MED ORDER — METOPROLOL TARTRATE 12.5 MG HALF TABLET
12.5000 mg | ORAL_TABLET | Freq: Two times a day (BID) | ORAL | Status: DC
  Administered 2015-10-25 – 2015-10-30 (×8): 12.5 mg via ORAL
  Filled 2015-10-25 (×9): qty 1

## 2015-10-25 MED ORDER — LACTATED RINGERS IV SOLN: INTRAVENOUS | Status: DC

## 2015-10-25 MED ORDER — FENTANYL CITRATE (PF) 250 MCG/5ML IJ SOLN
INTRAMUSCULAR | Status: DC | PRN
  Administered 2015-10-25 (×2): 50 ug via INTRAVENOUS
  Administered 2015-10-25 (×2): 100 ug via INTRAVENOUS

## 2015-10-25 MED ORDER — OXYCODONE HCL 5 MG PO TABS
ORAL_TABLET | ORAL | Status: AC
  Filled 2015-10-25: qty 2

## 2015-10-25 MED ORDER — PROPOFOL 10 MG/ML IV BOLUS
INTRAVENOUS | Status: AC
  Filled 2015-10-25: qty 20

## 2015-10-25 MED ORDER — METHOCARBAMOL 1000 MG/10ML IJ SOLN
500.0000 mg | Freq: Four times a day (QID) | INTRAVENOUS | Status: DC | PRN
  Administered 2015-10-26: 500 mg via INTRAVENOUS
  Filled 2015-10-25: qty 5

## 2015-10-25 MED ORDER — SUCCINYLCHOLINE CHLORIDE 20 MG/ML IJ SOLN
INTRAMUSCULAR | Status: DC | PRN
  Administered 2015-10-25: 100 mg via INTRAVENOUS

## 2015-10-25 MED ORDER — FUROSEMIDE 80 MG PO TABS
80.0000 mg | ORAL_TABLET | Freq: Every day | ORAL | Status: DC
  Administered 2015-10-26 – 2015-10-29 (×4): 80 mg via ORAL
  Filled 2015-10-25 (×5): qty 1

## 2015-10-25 MED ORDER — CITALOPRAM HYDROBROMIDE 40 MG PO TABS
40.0000 mg | ORAL_TABLET | Freq: Every day | ORAL | Status: DC
  Administered 2015-10-26 – 2015-10-30 (×5): 40 mg via ORAL
  Filled 2015-10-25 (×5): qty 1

## 2015-10-25 MED ORDER — LOSARTAN POTASSIUM 50 MG PO TABS
100.0000 mg | ORAL_TABLET | Freq: Every day | ORAL | Status: DC
  Administered 2015-10-25 – 2015-10-30 (×6): 100 mg via ORAL
  Filled 2015-10-25 (×6): qty 2

## 2015-10-25 MED ORDER — SODIUM CHLORIDE 0.9% FLUSH: 3.0000 mL | INTRAVENOUS | Status: DC | PRN

## 2015-10-25 SURGICAL SUPPLY — 74 items
ADH SKN CLS APL DERMABOND .7 (GAUZE/BANDAGES/DRESSINGS) ×4
APL SKNCLS STERI-STRIP NONHPOA (GAUZE/BANDAGES/DRESSINGS) ×2
BAG DECANTER FOR FLEXI CONT (MISCELLANEOUS) ×4 IMPLANT
BENZOIN TINCTURE PRP APPL 2/3 (GAUZE/BANDAGES/DRESSINGS) ×4 IMPLANT
BIT DRILL PLIF MAS DISP 5.5MM (DRILL) IMPLANT
BONE CANC CHIPS 40CC CAN1/2 (Bone Implant) ×4 IMPLANT
BUR MATCHSTICK NEURO 3.0 LAGG (BURR) ×4 IMPLANT
CANISTER SUCT 3000ML PPV (MISCELLANEOUS) ×4 IMPLANT
CHIPS CANC BONE 40CC CAN1/2 (Bone Implant) ×2 IMPLANT
CLIP NEUROVISION LG (CLIP) ×2 IMPLANT
CLOSURE WOUND 1/2 X4 (GAUZE/BANDAGES/DRESSINGS) ×1
COVER MAYO STAND STRL (DRAPES) ×2 IMPLANT
DERMABOND ADVANCED (GAUZE/BANDAGES/DRESSINGS) ×4
DERMABOND ADVANCED .7 DNX12 (GAUZE/BANDAGES/DRESSINGS) IMPLANT
DRAPE C-ARM 42X72 X-RAY (DRAPES) ×4 IMPLANT
DRAPE C-ARMOR (DRAPES) ×2 IMPLANT
DRAPE LAPAROTOMY 100X72X124 (DRAPES) ×4 IMPLANT
DRAPE MICROSCOPE LEICA (MISCELLANEOUS) ×2 IMPLANT
DRAPE POUCH INSTRU U-SHP 10X18 (DRAPES) ×4 IMPLANT
DRAPE SURG 17X23 STRL (DRAPES) ×4 IMPLANT
DRILL PLIF MAS DISP 5.5MM (DRILL) ×4
DRSG OPSITE 4X5.5 SM (GAUZE/BANDAGES/DRESSINGS) ×2 IMPLANT
DRSG OPSITE POSTOP 4X6 (GAUZE/BANDAGES/DRESSINGS) ×2 IMPLANT
DRSG OPSITE POSTOP 4X8 (GAUZE/BANDAGES/DRESSINGS) ×2 IMPLANT
DURAPREP 26ML APPLICATOR (WOUND CARE) ×4 IMPLANT
ELECT BLADE 4.0 EZ CLEAN MEGAD (MISCELLANEOUS) ×4
ELECT REM PT RETURN 9FT ADLT (ELECTROSURGICAL) ×4
ELECTRODE BLDE 4.0 EZ CLN MEGD (MISCELLANEOUS) IMPLANT
ELECTRODE REM PT RTRN 9FT ADLT (ELECTROSURGICAL) ×2 IMPLANT
GAUZE SPONGE 4X4 16PLY XRAY LF (GAUZE/BANDAGES/DRESSINGS) IMPLANT
GLOVE BIO SURGEON STRL SZ8 (GLOVE) ×6 IMPLANT
GLOVE BIOGEL PI IND STRL 7.5 (GLOVE) IMPLANT
GLOVE BIOGEL PI IND STRL 8 (GLOVE) IMPLANT
GLOVE BIOGEL PI INDICATOR 7.5 (GLOVE) ×4
GLOVE BIOGEL PI INDICATOR 8 (GLOVE) ×8
GLOVE ECLIPSE 7.5 STRL STRAW (GLOVE) ×8 IMPLANT
GOWN STRL REUS W/ TWL LRG LVL3 (GOWN DISPOSABLE) IMPLANT
GOWN STRL REUS W/ TWL XL LVL3 (GOWN DISPOSABLE) ×2 IMPLANT
GOWN STRL REUS W/TWL 2XL LVL3 (GOWN DISPOSABLE) ×4 IMPLANT
GOWN STRL REUS W/TWL LRG LVL3 (GOWN DISPOSABLE)
GOWN STRL REUS W/TWL XL LVL3 (GOWN DISPOSABLE) ×12
GRAFT BNE CHIP CANC 1-8 40 (Bone Implant) IMPLANT
HEMOSTAT POWDER KIT SURGIFOAM (HEMOSTASIS) ×2 IMPLANT
KIT BASIN OR (CUSTOM PROCEDURE TRAY) ×4 IMPLANT
KIT INFUSE XX SMALL 0.7CC (Orthopedic Implant) ×2 IMPLANT
KIT ROOM TURNOVER OR (KITS) ×4 IMPLANT
MILL MEDIUM DISP (BLADE) ×2 IMPLANT
MODULE NVM5 NEXT GEN EMG (NEEDLE) ×2 IMPLANT
NDL HYPO 25X1 1.5 SAFETY (NEEDLE) ×2 IMPLANT
NDL SPNL 20GX3.5 QUINCKE YW (NEEDLE) IMPLANT
NEEDLE HYPO 25X1 1.5 SAFETY (NEEDLE) ×4 IMPLANT
NEEDLE SPNL 20GX3.5 QUINCKE YW (NEEDLE) IMPLANT
NS IRRIG 1000ML POUR BTL (IV SOLUTION) ×4 IMPLANT
PACK LAMINECTOMY NEURO (CUSTOM PROCEDURE TRAY) ×4 IMPLANT
PAD ARMBOARD 7.5X6 YLW CONV (MISCELLANEOUS) ×12 IMPLANT
PEEK TLIF COROENT ANT 8X9X30-8 (Peek) ×2 IMPLANT
ROD 5.5X40MM (Rod) ×4 IMPLANT
RUBBERBAND STERILE (MISCELLANEOUS) ×4 IMPLANT
SCREW LOCK (Screw) ×16 IMPLANT
SCREW LOCK FXNS SPNE MAS PL (Screw) IMPLANT
SCREW PLIF MAS 5.0X35 (Screw) ×4 IMPLANT
SCREW SHANKS 5.5X35 (Screw) ×4 IMPLANT
SCREW TULIP 5.5 (Screw) ×4 IMPLANT
SPONGE SURGIFOAM ABS GEL 100 (HEMOSTASIS) ×2 IMPLANT
SPONGE SURGIFOAM ABS GEL SZ50 (HEMOSTASIS) ×2 IMPLANT
STRIP CLOSURE SKIN 1/2X4 (GAUZE/BANDAGES/DRESSINGS) ×3 IMPLANT
SUT VIC AB 0 CT1 18XCR BRD8 (SUTURE) ×2 IMPLANT
SUT VIC AB 0 CT1 8-18 (SUTURE) ×8
SUT VIC AB 2-0 CP2 18 (SUTURE) ×6 IMPLANT
SUT VIC AB 3-0 SH 8-18 (SUTURE) ×8 IMPLANT
TOWEL OR 17X24 6PK STRL BLUE (TOWEL DISPOSABLE) ×4 IMPLANT
TOWEL OR 17X26 10 PK STRL BLUE (TOWEL DISPOSABLE) ×4 IMPLANT
TRAP SPECIMEN MUCOUS 40CC (MISCELLANEOUS) ×2 IMPLANT
WATER STERILE IRR 1000ML POUR (IV SOLUTION) ×4 IMPLANT

## 2015-10-25 NOTE — Anesthesia Preprocedure Evaluation (Signed)
Anesthesia Evaluation  Patient identified by MRN, date of birth, ID band Patient awake    Reviewed: Allergy & Precautions, NPO status , Patient's Chart, lab work & pertinent test results, reviewed documented beta blocker date and time   History of Anesthesia Complications (+) PROLONGED EMERGENCE and history of anesthetic complications  Airway Mallampati: III  TM Distance: >3 FB Neck ROM: Full    Dental  (+) Teeth Intact   Pulmonary neg pulmonary ROS,    breath sounds clear to auscultation       Cardiovascular hypertension, Pt. on medications and Pt. on home beta blockers +CHF   Rhythm:Regular     Neuro/Psych PSYCHIATRIC DISORDERS Anxiety negative neurological ROS     GI/Hepatic Neg liver ROS, GERD  ,  Endo/Other  negative endocrine ROS  Renal/GU Renal disease  negative genitourinary   Musculoskeletal  (+) Arthritis , Osteoarthritis,    Abdominal   Peds negative pediatric ROS (+)  Hematology negative hematology ROS (+)   Anesthesia Other Findings   Reproductive/Obstetrics negative OB ROS                             Lab Results  Component Value Date   WBC 4.3 10/17/2015   HGB 14.5 10/17/2015   HCT 43.2 10/17/2015   MCV 88.9 10/17/2015   PLT 163 10/17/2015   Lab Results  Component Value Date   CREATININE 1.29* 10/17/2015   BUN 11 10/17/2015   NA 139 10/17/2015   K 3.5 10/17/2015   CL 100* 10/17/2015   CO2 29 10/17/2015   Lab Results  Component Value Date   INR 1.08 10/17/2015   INR 2.22* 04/30/2013   INR 1.63* 04/29/2013   06/2015 EKG: normal sinus rhythm.  05/2015 Echo - Left ventricle: The cavity size was normal. Wall thickness was normal. The estimated ejection fraction was 50%. Diffuse hypokinesis. Doppler parameters are consistent with abnormal left ventricular relaxation (grade 1 diastolic dysfunction). - Aortic valve: There was no stenosis. - Mitral valve:  Mildly calcified annulus. Normal thickness leaflets . There was no significant regurgitation. - Right ventricle: The cavity size was normal. Systolic function was normal. - Tricuspid valve: Peak RV-RA gradient (S): 16 mm Hg. - Pulmonary arteries: PA peak pressure: 19 mm Hg (S). - Inferior vena cava: The vessel was normal in size. The respirophasic diameter changes were in the normal range (>= 50%), consistent with normal central venous pressure.     Anesthesia Physical Anesthesia Plan  ASA: III  Anesthesia Plan: General   Post-op Pain Management:    Induction: Intravenous  Airway Management Planned: Oral ETT  Additional Equipment:   Intra-op Plan:   Post-operative Plan: Extubation in OR  Informed Consent: I have reviewed the patients History and Physical, chart, labs and discussed the procedure including the risks, benefits and alternatives for the proposed anesthesia with the patient or authorized representative who has indicated his/her understanding and acceptance.   Dental advisory given  Plan Discussed with: CRNA  Anesthesia Plan Comments: (Conseravtive Opioid administration, Will administer IV Tylenol and Ketorolac if surgeon agreeable. )        Anesthesia Quick Evaluation

## 2015-10-25 NOTE — Anesthesia Postprocedure Evaluation (Signed)
Anesthesia Post Note  Patient: [name]  Procedure(s) Performed: Procedure(s) (LRB): LUMBAR FOUR-FIVE TRANSFORAMINAL LUMBAR INTERBODY FUSION  (N/A) Lumbar Laminectomy Lumbar Two- Three, Thoracic Laminectomy Thoracic Ten-Eleven, Thoracic Eleven-Twelve (N/A)  Patient location during evaluation: PACU Anesthesia Type: General Level of consciousness: awake and alert Pain management: pain level controlled Vital Signs Assessment: post-procedure vital signs reviewed and stable Respiratory status: spontaneous breathing, nonlabored ventilation, respiratory function stable and patient connected to nasal cannula oxygen Cardiovascular status: blood pressure returned to baseline and stable Postop Assessment: no signs of nausea or vomiting Anesthetic complications: no    Last Vitals:  Filed Vitals:   10/25/15 1715 10/25/15 1730  BP: 123/67 117/74  Pulse: 62 70  Temp:    Resp: 23 20    Last Pain:  Filed Vitals:   10/25/15 1743  PainSc: 8                  Barrington Worley A

## 2015-10-25 NOTE — Progress Notes (Signed)
Patient admitted from PACU. Patient alert and oriented x 4. Patient oriented to room and made comfortable. Family at the bed side. Will continue to monitor.

## 2015-10-25 NOTE — Op Note (Signed)
10/25/2015  2:53 PM  PATIENT:  [name]  65 y.o. 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 Decompressive lumbar laminectomy L4-5 requiring more work than would be required for a simple exposure of the disk for PLIF in order to adequately decompress the neural elements and address the spinal stenosis 2. Transforaminal lumbar interbody fusion L4-5 using PEEK interbody cages packed with morcellized allograft and autograft 3. Posterior fixation L4-5 using cortical pedicle screws.  4. Intertransverse arthrodesis L4-5 using morcellized autograft and allograft. 5. Decompressive lumbar hemilaminectomy, medial facetectomy and foraminotomy with sublaminar decompression L2-3 for canal stenosis, noted this level is separate from the TLIF level 6. Through a separate incision, decompressive thoracic laminectomy and medial facetectomy T10-11 and T11-12  SURGEON:  Sherley Bounds, MD  ASSISTANTS: Dr. Sherwood Gambler  ANESTHESIA:  General  EBL: 100 ml  Total I/O In: 1000 [I.V.:1000] Out: 290 [Urine:190; Blood:100]  BLOOD ADMINISTERED:none  DRAINS: Hemovac   INDICATION FOR PROCEDURE: This patient presented with back pain and difficulty walking. She had an MRI which showed a mobile spondylolisthesis at L4-5 with stenosis, lumbar spinal stenosis L2-3, and thoracic spinal stenosis T10-11 and T11-12. She tried medical management without relief. I recommended decompressive thoracic laminectomy, decompressive lumbar laminectomy, instrumented fusion at L4-5. Patient understood the risks, benefits, and alternatives and potential outcomes and wished to proceed.  PROCEDURE DETAILS:  The patient was brought to the operating room. After induction of generalized endotracheal anesthesia the patient was rolled into the prone position on chest rolls and  all pressure points were padded. The patient's lumbar region was cleaned and then prepped with DuraPrep and draped in the usual sterile fashion. Anesthesia was injected and then a dorsal midline incision was made and carried down to the lumbosacral fascia. The fascia was opened and the paraspinous musculature was taken down in a subperiosteal fashion to expose the L4-5 L3-4 and L2-3. A self-retaining retractor was placed. Intraoperative fluoroscopy confirmed my level, and I started with placement of the L4 cortical pedicle screws. The pedicle screw entry zones were identified utilizing surface landmarks and  AP and lateral fluoroscopy. I scored the cortex with the high-speed drill and then used the hand drill and EMG monitoring to drill an upward and outward direction into the pedicle. I then tapped line to line, and the tap was also monitored. I then placed a 5-5 x 35 mm cortical pedicle screw into the pedicles of L4 bilaterally. I then turned my attention to the decompression and the spinous process was removed and complete lumbar laminectomies, hemi- facetectomies, and foraminotomies were performed at L4-5. The patient had significant spinal stenosis and this required more work than would be required for a simple exposure of the disc for posterior lumbar interbody fusion. Much more generous decompression was undertaken in order to adequately decompress the neural elements and address the patient's leg pain. The yellow ligament was removed to expose the underlying dura and nerve roots, and generous foraminotomies were performed to adequately decompress the neural elements. Both the exiting and traversing nerve roots were decompressed on both sides until a coronary dilator passed easily along the nerve roots. Once the decompression was complete, I turned my attention to the transforaminal lower lumbar interbody fusion. The epidural venous vasculature was coagulated and cut sharply. Disc space was incised and the  initial discectomy was performed with pituitary rongeurs. The disc space  was distracted with sequential distractors to a height of 9 mm. We then used a series of scrapers and shavers to prepare the endplates for fusion. The midline was prepared with Epstein curettes. Once the complete discectomy was finished, we packed an appropriate sized peek interbody cage with local autograft and morcellized allograft, gently retracted the nerve root, and tapped the cage into position at L4-5 from the left.  Posterior to the cage was packed with morselized autograft and allograft. We then turned our attention to the placement of the lower pedicle screws. The pedicle screw entry zones were identified utilizing surface landmarks and fluoroscopy. I drilled into each pedicle utilizing the hand drill and EMG monitoring, and tapped each pedicle with the appropriate tap. We palpated with a ball probe to assure no break in the cortex. We then placed 5-5 x 35 mm cortical pedicle screws into the pedicles bilaterally at L5. We then decorticated the transverse processes and laid a mixture of morcellized autograft and allograft out over these to perform intertransverse arthrodesis at L4-5. We then placed lordotic rods into the multiaxial screw heads of the pedicle screws and locked these in position with the locking caps and anti-torque device. I then exposed L2-3 on the left and used fluoroscopy to confirm my level. A combination of the high speed drill and the Kerrison punches were used to perform a hemi-laminectomy, medial facetectomy and foraminotomies L2-3 on the left. I drilled up under the spinous process and performed a sublaminar decompression and decompress the opposite lateral recess. I then palpated with a coronary dilator. I then lined the dura with Gelfoam and turned my attention to the thoracic laminectomy.  A separate incision was made over T10-11 and T11-12. I dissected down to the thoracic fascia and opened it and  utilizing subperiosteal dissection exposed T10-11 T11-12. Intraoperative fluoroscopy confirm our level. I then removed the spinous processes and then drilled the lamina down to eggshell thickness. I then used the 3 mm Kerrison punch to perform laminectomies and medial facetectomies at T10-11 and T11-12. Undercut the lateral recess. The dura was well decompressed. Irrigated with saline solution. We then checked our construct with AP and lateral fluoroscopy. Irrigated with copious amounts of bacitracin-containing saline solution. Placed a medium Hemovac drain through separate stab incision in the thoracic region. Inspected the nerve roots once again to assure adequate decompression, lined to the dura with Gelfoam, and closed the muscle and the fascia with 0 Vicryl. Closed the subcutaneous tissues with 2-0 Vicryl and subcuticular tissues with 3-0 Vicryl. The skin was closed with benzoin and Steri-Strips. Dressing was then applied, the patient was awakened from general anesthesia and transported to the recovery room in stable condition. At the end of the procedure all sponge, needle and instrument counts were correct.   PLAN OF CARE: Admit to inpatient   PATIENT DISPOSITION:  PACU - hemodynamically stable.   Delay start of Pharmacological VTE agent (>24hrs) due to surgical blood loss or risk of bleeding:  yes

## 2015-10-25 NOTE — Anesthesia Procedure Notes (Signed)
Procedure Name: Intubation Date/Time: 10/25/2015 10:47 AM Performed by: Julian Reil Pre-anesthesia Checklist: Patient identified, Emergency Drugs available, Suction available and Patient being monitored Patient Re-evaluated:Patient Re-evaluated prior to inductionOxygen Delivery Method: Circle system utilized Preoxygenation: Pre-oxygenation with 100% oxygen Intubation Type: IV induction Ventilation: Mask ventilation without difficulty Laryngoscope Size: Mac and 3 Grade View: Grade I Tube type: Oral Tube size: 7.0 mm Number of attempts: 1 Airway Equipment and Method: Stylet Placement Confirmation: positive ETCO2,  ETT inserted through vocal cords under direct vision and breath sounds checked- equal and bilateral Secured at: 21 cm Tube secured with: Tape Dental Injury: Teeth and Oropharynx as per pre-operative assessment
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
## 2015-10-25 NOTE — Transfer of Care (Signed)
Immediate Anesthesia Transfer of Care Note  Patient: [name]  Procedure(s) Performed: Procedure(s): LUMBAR FOUR-FIVE TRANSFORAMINAL LUMBAR INTERBODY FUSION  (N/A) Lumbar Laminectomy Lumbar Two- Three, Thoracic Laminectomy Thoracic Ten-Eleven, Thoracic Eleven-Twelve (N/A)  Patient Location: PACU  Anesthesia Type:General  Level of Consciousness: awake, alert , oriented and patient cooperative  Airway & Oxygen Therapy: Patient Spontanous Breathing and Patient connected to face mask oxygen  Post-op Assessment: Report given to RN, Post -op Vital signs reviewed and stable and Patient moving all extremities  Post vital signs: Reviewed and stable  Last Vitals:  Filed Vitals:   10/25/15 0923  BP: 137/66  Pulse: 56  Temp: 36.8 C  Resp: 20    Complications: No apparent anesthesia complications

## 2015-10-26 ENCOUNTER — Encounter (HOSPITAL_COMMUNITY): Payer: Self-pay | Admitting: Neurological Surgery

## 2015-10-26 DIAGNOSIS — E7800 Pure hypercholesterolemia, unspecified: Secondary | ICD-10-CM | POA: Diagnosis not present

## 2015-10-26 DIAGNOSIS — I509 Heart failure, unspecified: Secondary | ICD-10-CM | POA: Diagnosis not present

## 2015-10-26 DIAGNOSIS — M4316 Spondylolisthesis, lumbar region: Secondary | ICD-10-CM | POA: Diagnosis not present

## 2015-10-26 DIAGNOSIS — M4805 Spinal stenosis, thoracolumbar region: Secondary | ICD-10-CM | POA: Diagnosis not present

## 2015-10-26 DIAGNOSIS — K219 Gastro-esophageal reflux disease without esophagitis: Secondary | ICD-10-CM | POA: Diagnosis not present

## 2015-10-26 DIAGNOSIS — M545 Low back pain: Secondary | ICD-10-CM | POA: Diagnosis not present

## 2015-10-26 DIAGNOSIS — I10 Essential (primary) hypertension: Secondary | ICD-10-CM | POA: Diagnosis not present

## 2015-10-26 MED ORDER — NON FORMULARY: 40.0000 mg | Freq: Every morning | Status: DC

## 2015-10-26 MED ORDER — PROMETHAZINE HCL 25 MG/ML IJ SOLN
12.5000 mg | Freq: Four times a day (QID) | INTRAMUSCULAR | Status: DC | PRN
  Filled 2015-10-26: qty 1

## 2015-10-26 MED ORDER — ESOMEPRAZOLE MAGNESIUM 40 MG PO CPDR
40.0000 mg | DELAYED_RELEASE_CAPSULE | Freq: Every day | ORAL | Status: DC
  Administered 2015-10-26 – 2015-10-29 (×4): 40 mg via ORAL
  Filled 2015-10-26 (×5): qty 1

## 2015-10-26 MED ORDER — PROMETHAZINE HCL 25 MG/ML IJ SOLN
12.5000 mg | Freq: Four times a day (QID) | INTRAMUSCULAR | Status: DC | PRN
Start: 2015-10-26 — End: 2015-10-30
  Administered 2015-10-26: 12.5 mg via INTRAVENOUS

## 2015-10-26 MED FILL — Vancomycin HCl For IV Soln 1 GM (Base Equivalent): INTRAVENOUS | Qty: 1000 | Status: AC

## 2015-10-26 NOTE — Progress Notes (Signed)
Patient ID: [name], female   DOB: 1951/03/17, 65 y.o.   MRN: [id number] Subjective: Patient reports back soreness, no leg pain or NTW  Objective: Vital signs in last 24 hours: Temp:  [97.5 F (36.4 C)-98.6 F (37 C)] 98.6 F (37 C) (03/09 0605) Pulse Rate:  [56-74] 65 (03/09 0605) Resp:  [12-23] 18 (03/09 0119) BP: (96-139)/(53-86) 104/53 mmHg (03/09 0605) SpO2:  [96 %-100 %] 96 % (03/09 0119)  Intake/Output from previous day: 03/08 0701 - 03/09 0700 In: 1000 [I.V.:1000] Out: 380 [Urine:190; Drains:90; Blood:100] Intake/Output this shift:    Neurologic: Grossly normal  Lab Results: Lab Results  Component Value Date   WBC 4.3 10/17/2015   HGB 14.5 10/17/2015   HCT 43.2 10/17/2015   MCV 88.9 10/17/2015   PLT 163 10/17/2015   Lab Results  Component Value Date   INR 1.08 10/17/2015   BMET Lab Results  Component Value Date   NA 139 10/17/2015   K 3.5 10/17/2015   CL 100* 10/17/2015   CO2 29 10/17/2015   GLUCOSE 106* 10/17/2015   BUN 11 10/17/2015   CREATININE 1.29* 10/17/2015   CALCIUM 9.3 10/17/2015    Studies/Results: Dg Lumbar Spine 2-3 Views  10/25/2015  CLINICAL DATA:  L4-5 fusion EXAM: LUMBAR SPINE - 2-3 VIEW; DG C-ARM 61-120 MIN COMPARISON:  10/02/2015 FINDINGS: Status post fusion at L4-5 with transpedicular screws and connecting rounds. Interbody fusion device noted. IMPRESSION: Postoperative change as described Electronically Signed   By: Skipper Cliche M.D.   On: 10/25/2015 14:12   Dg C-arm 1-60 Min  10/25/2015  CLINICAL DATA:  L4-5 fusion EXAM: LUMBAR SPINE - 2-3 VIEW; DG C-ARM 61-120 MIN COMPARISON:  10/02/2015 FINDINGS: Status post fusion at L4-5 with transpedicular screws and connecting rounds. Interbody fusion device noted. IMPRESSION: Postoperative change as described Electronically Signed   By: Skipper Cliche M.D.   On: 10/25/2015 14:12    Assessment/Plan: Seems to be doing ok, I think motivation will be a big factor in her recovery,  encouraged her to work with therapy   LOS: 1 day    [name] S 10/26/2015, 9:11 AM

## 2015-10-26 NOTE — Evaluation (Signed)
Occupational Therapy Evaluation Patient Details Name: [name] MRN: [id number] DOB: September 13, 1950 Today's Date: 10/26/2015    History of Present Illness s/p T10-12, L2-3 laminectomies/decompression, L4-5 fusion PMHx- obesity, Rt TSA, bil TKA, CHF   Clinical Impression   Pt reports she required assist from a home health aide with ADLs PTA. Currently pt is overall min guard for functional mobility and mod-max assist for ADL activities. Pt with emesis x 2 during session, no nausea reported; RN notified. Began back, safety, and ADL education with pt. Pt planning to d/c home with 24/7 supervision from family and [facility] aide. At this time, recommending HHOT for follow up in order to maximize independence and safety with ADLs and functional mobility. Pt may progress to home without need for HHOT. Pt would benefit from continued skilled OT to address established goals.    Follow Up Recommendations  Home health OT;Supervision - Intermittent    Equipment Recommendations  Tub/shower seat;Other (comment) Management consultant)    Recommendations for Other Services       Precautions / Restrictions Precautions Precautions: Back Precaution Booklet Issued: Yes (comment) Precaution Comments: Pt able to verbally recall 3/3 back precautions at start of session. Required Braces or Orthoses: Spinal Brace Spinal Brace: Lumbar corset;Applied in sitting position Restrictions Weight Bearing Restrictions: No      Mobility Bed Mobility Overal bed mobility: Needs Assistance Bed Mobility: Rolling;Sidelying to Sit Rolling: Min assist Sidelying to sit: Min assist;HOB elevated       General bed mobility comments: Pt OOB in chair upon arrival  Transfers Overall transfer level: Needs assistance Equipment used: Rolling walker (2 wheeled) Transfers: Sit to/from Stand Sit to Stand: Min guard         General transfer comment: VCs for hand placement and technique. Hands on min guard for safety.    Balance  Overall balance assessment: Needs assistance Sitting-balance support: Feet supported;No upper extremity supported Sitting balance-Leahy Scale: Good     Standing balance support: No upper extremity supported;During functional activity Standing balance-Leahy Scale: Fair Standing balance comment: Pt able to stand at sink and complete grooming activities without UE support                            ADL Overall ADL's : Needs assistance/impaired Eating/Feeding: Set up;Sitting   Grooming: Min guard;Oral care;Standing Grooming Details (indicate cue type and reason): Educated pt on use of 2 cups for oral care; pt able to return demo. Upper Body Bathing: Moderate assistance;Sitting   Lower Body Bathing: Maximal assistance;Sit to/from stand   Upper Body Dressing : Moderate assistance;Sitting Upper Body Dressing Details (indicate cue type and reason): brace already donned Lower Body Dressing: Maximal assistance;Sit to/from stand   Toilet Transfer: Min guard;Ambulation;BSC;RW   Toileting- Clothing Manipulation and Hygiene: Moderate assistance;Sit to/from stand       Functional mobility during ADLs: Min guard;Rolling walker General ADL Comments: No family present for OT eval. Pt with emesis x 2 during session but no report of nausea (RN notified). Educated pt on maintaining back precautions during functional activities, placing frequnelty used items at Dorsey top height, home safety.      Vision Vision Assessment?: No apparent visual deficits   Perception     Praxis      Pertinent Vitals/Pain Pain Assessment: 0-10 Pain Score: 6  Pain Location: back Pain Descriptors / Indicators: Sore Pain Intervention(s): Limited activity within patient's tolerance;Monitored during session     Hand Dominance  (ambidextrous)  Extremity/Trunk Assessment Upper Extremity Assessment Upper Extremity Assessment: Generalized weakness   Lower Extremity Assessment Lower Extremity  Assessment: Defer to PT evaluation   Cervical / Trunk Assessment Cervical / Trunk Assessment: Other exceptions Cervical / Trunk Exceptions: morbid obesity   Communication Communication Communication: No difficulties   Cognition Arousal/Alertness: Awake/alert Behavior During Therapy: WFL for tasks assessed/performed Overall Cognitive Status: Within Functional Limits for tasks assessed                     General Comments       Exercises       Shoulder Instructions      Home Living Family/patient expects to be discharged to:: Private residence Living Arrangements: Alone Available Help at Discharge: Family;Personal care attendant;Available 24 hours/day (grandchildren-grown, aide 7 days per week x 3 hrs) Type of Home: Apartment Home Access: Level entry     Home Layout: One level     Bathroom Shower/Tub: Tub/shower unit;Curtain Shower/tub characteristics: Architectural technologist: Handicapped height Bathroom Accessibility: Yes How Accessible: Accessible via walker Home Equipment: [place] - 2 wheels;Bedside commode;Grab bars - tub/shower;Grab bars - toilet          Prior Functioning/Environment Level of Independence: Needs assistance  Gait / Transfers Assistance Needed: off balance & holding wall or someone; assist with steps ADL's / Homemaking Assistance Needed: aid assists with bathing, dressing, housework        OT Diagnosis: Generalized weakness;Acute pain   OT Problem List: Decreased strength;Decreased activity tolerance;Impaired balance (sitting and/or standing);Decreased range of motion;Decreased safety awareness;Decreased knowledge of use of DME or AE;Decreased knowledge of precautions;Obesity;Impaired UE functional use;Pain   OT Treatment/Interventions: Self-care/ADL training;Energy conservation;DME and/or AE instruction;Therapeutic activities;Patient/family education;Balance training    OT Goals(Current goals can be found in the care plan section)  Acute Rehab OT Goals Patient Stated Goal: "behave so my back will heal and not get an infection" OT Goal Formulation: With patient Time For Goal Achievement: 11/09/15 Potential to Achieve Goals: Good ADL Goals Pt Will Perform Grooming: standing;with supervision Pt Will Transfer to Toilet: with supervision;ambulating;bedside commode Pt Will Perform Toileting - Clothing Manipulation and hygiene: with supervision;sit to/from stand Pt Will Perform Tub/Shower Transfer: with supervision;Tub transfer;ambulating;shower seat;rolling walker Additional ADL Goal #1: Pt will independently don/doff back brace for increased independence/safety with ADLs and functional mobility. Additional ADL Goal #2: Pt will independenlty verbally recall 3/3 back precautions and maintain throughout ADL activity.  OT Frequency: Min 2X/week   Barriers to D/C:            Co-evaluation              End of Session Equipment Utilized During Treatment: Gait belt;Rolling walker;Back brace Nurse Communication: Other (comment);Patient requests pain meds (pt with emesis x 2 during session)  Activity Tolerance: Patient tolerated treatment well Patient left: in chair;with call bell/phone within reach   Time: 1001-1021 OT Time Calculation (min): 20 min Charges:  OT General Charges $OT Visit: 1 Procedure OT Evaluation $OT Eval Moderate Complexity: 1 Procedure G-Codes:     Binnie Kand M.S., OTR/L Pager: [phone]  10/26/2015, 10:37 AM

## 2015-10-26 NOTE — Evaluation (Signed)
Physical Therapy Evaluation Patient Details Name: [name] MRN: [id number] DOB: 19-Feb-1951 Today's Date: 10/26/2015   History of Present Illness  s/p T10-12, L2-3 laminectomies/decompression, L4-5 fusion PMHx- obesity, Rt TSA, bil TKA, CHF  Clinical Impression  Patient is s/p above surgery resulting in the deficits listed below (see PT Problem List). Progressing well despite very anxious re: pain and needing encouragement. Patient will benefit from skilled PT to increase their independence and safety with mobility (while adhering to their precautions) to allow discharge to the venue listed below.     Follow Up Recommendations Home health PT;Supervision for mobility/OOB (may not need HH if continues to progress well)    Equipment Recommendations  None recommended by PT    Recommendations for Other Services OT consult     Precautions / Restrictions Precautions Precautions: Back Precaution Booklet Issued: Yes (comment) Required Braces or Orthoses: Spinal Brace Spinal Brace: Lumbar corset;Applied in sitting position Restrictions Weight Bearing Restrictions: No      Mobility  Bed Mobility Overal bed mobility: Needs Assistance Bed Mobility: Rolling;Sidelying to Sit Rolling: Min assist Sidelying to sit: Min assist;HOB elevated       General bed mobility comments: +rail; HOB 20 (due to pt size); pt exits at home to her left  Transfers Overall transfer level: Needs assistance Equipment used: Rolling walker (2 wheeled) Transfers: Sit to/from Stand Sit to Stand: Min guard         General transfer comment: vc for safe use of RW and technique  Ambulation/Gait Ambulation/Gait assistance: Min guard Ambulation Distance (Feet): 110 Feet Assistive device: Rolling walker (2 wheeled) Gait Pattern/deviations: Step-through pattern;Decreased stride length     General Gait Details: vc for safe use of RW with excellent carryover  Stairs            Wheelchair  Mobility    Modified Rankin (Stroke Patients Only)       Balance                                             Pertinent Vitals/Pain Pain Assessment: 0-10 Pain Score: 10-Worst pain ever Pain Descriptors / Indicators: Spasm Pain Intervention(s): Limited activity within patient's tolerance;Monitored during session;Premedicated before session;Repositioned    Home Living Family/patient expects to be discharged to:: Private residence Living Arrangements: Alone Available Help at Discharge: Family;Personal care attendant;Available 24 hours/day (grandchildren-grown; aide 7days/week x 3 hours) Type of Home: Apartment Home Access: Level entry     Home Layout: One level Home Equipment: Walker - 2 wheels;Bedside commode;Grab bars - tub/shower (BSC by bed or over toilet)      Prior Function Level of Independence: Needs assistance   Gait / Transfers Assistance Needed: off balance & holding wall or someone; assist with steps  ADL's / Homemaking Assistance Needed: aid assists with bathing, dressing, housework        Hand Dominance   Dominant Hand:  (ambidextrous)    Extremity/Trunk Assessment   Upper Extremity Assessment: Defer to OT evaluation (Rt shoulder very limited)           Lower Extremity Assessment: Overall WFL for tasks assessed      Cervical / Trunk Assessment: Other exceptions  Communication   Communication: No difficulties  Cognition Arousal/Alertness: Awake/alert Behavior During Therapy: WFL for tasks assessed/performed Overall Cognitive Status: Within Functional Limits for tasks assessed  General Comments General comments (skin integrity, edema, etc.): assist to don brace    Exercises        Assessment/Plan    PT Assessment Patient needs continued PT services  PT Diagnosis Difficulty walking;Acute pain   PT Problem List Decreased activity tolerance;Decreased mobility;Decreased knowledge of use of  DME;Decreased knowledge of precautions;Obesity;Pain  PT Treatment Interventions DME instruction;Gait training;Functional mobility training;Therapeutic activities;Patient/family education   PT Goals (Current goals can be found in the Care Plan section) Acute Rehab PT Goals Patient Stated Goal: "behave so my back will heal and not get an infection" PT Goal Formulation: With patient Time For Goal Achievement: 10/30/15 Potential to Achieve Goals: Good    Frequency Min 5X/week   Barriers to discharge        Co-evaluation               End of Session Equipment Utilized During Treatment: Gait belt;Back brace Activity Tolerance: Patient tolerated treatment well Patient left: in chair;with call bell/phone within reach (chair alarm pad in place; no chair alarm in room) Nurse Communication: Mobility status (no chair alarm; pt alert & oriented)         Time: [id number] PT Time Calculation (min) (ACUTE ONLY): 39 min   Charges:   PT Evaluation $PT Eval Moderate Complexity: 1 Procedure PT Treatments $Gait Training: 23-37 mins   PT G Codes:        Brogan England 11/19/15, 9:57 AM Pager [phone]

## 2015-10-26 NOTE — Progress Notes (Signed)
Pt with vomiting throughout the day. States not nauseated, just has episodes of reflux with emesis. Notified Dr. Ronnald Ramp who ordered phenergan. Given IV with 2mg  morphine due to pt recently vomited oxy IR and robaxin. RN witnessed tablets in emesis. Will continue to monitor. Wendee Copp

## 2015-10-26 NOTE — Care Management Note (Signed)
Case Management Note  Patient Details  Name: GWENLYN RECALDE MRN: [id number] Date of Birth: [birth year]  Subjective/Objective:                    Action/Plan: Patient was admitted for a LUMBAR FOUR-FIVE TRANSFORAMINAL LUMBAR INTERBODY FUSION.  Lives at home alone. Has a private duty home health aide.  Will follow for discharge needs pending PT/OT evals and physician orders.  Expected Discharge Date:                  Expected Discharge Plan:     In-House Referral:     Discharge planning Services     Post Acute Care Choice:    Choice offered to:     DME Arranged:    DME Agency:     HH Arranged:    HH Agency:     Status of Service:  In process, will continue to follow  Medicare Important Message Given:    Date Medicare IM Given:    Medicare IM give by:    Date Additional Medicare IM Given:    Additional Medicare Important Message give by:     If discussed at [place] of Stay Meetings, dates discussed:    Additional CommentsRolm Baptise, RN 10/26/2015, 9:24 AM [phone]

## 2015-10-27 ENCOUNTER — Inpatient Hospital Stay (HOSPITAL_COMMUNITY): Payer: Medicare Other

## 2015-10-27 DIAGNOSIS — R509 Fever, unspecified: Secondary | ICD-10-CM | POA: Diagnosis not present

## 2015-10-27 DIAGNOSIS — M4805 Spinal stenosis, thoracolumbar region: Secondary | ICD-10-CM | POA: Diagnosis not present

## 2015-10-27 DIAGNOSIS — I10 Essential (primary) hypertension: Secondary | ICD-10-CM | POA: Diagnosis not present

## 2015-10-27 DIAGNOSIS — I509 Heart failure, unspecified: Secondary | ICD-10-CM | POA: Diagnosis not present

## 2015-10-27 DIAGNOSIS — E7800 Pure hypercholesterolemia, unspecified: Secondary | ICD-10-CM | POA: Diagnosis not present

## 2015-10-27 DIAGNOSIS — M4316 Spondylolisthesis, lumbar region: Secondary | ICD-10-CM | POA: Diagnosis not present

## 2015-10-27 DIAGNOSIS — K219 Gastro-esophageal reflux disease without esophagitis: Secondary | ICD-10-CM | POA: Diagnosis not present

## 2015-10-27 DIAGNOSIS — M545 Low back pain: Secondary | ICD-10-CM | POA: Diagnosis not present

## 2015-10-27 LAB — URINALYSIS, ROUTINE W REFLEX MICROSCOPIC
Bilirubin Urine: NEGATIVE
GLUCOSE, UA: NEGATIVE mg/dL
KETONES UR: NEGATIVE mg/dL
LEUKOCYTES UA: NEGATIVE
Nitrite: NEGATIVE
PH: 5 (ref 5.0–8.0)
Protein, ur: NEGATIVE mg/dL
Specific Gravity, Urine: 1.008 (ref 1.005–1.030)

## 2015-10-27 LAB — URINE MICROSCOPIC-ADD ON
BACTERIA UA: NONE SEEN
Squamous Epithelial / LPF: NONE SEEN

## 2015-10-27 NOTE — Progress Notes (Signed)
Physical Therapy Treatment Patient Details Name: [name] MRN: [id number] DOB: Feb 20, 1951 Today's Date: 10/27/2015    History of Present Illness s/p T10-12, L2-3 laminectomies/decompression, L4-5 fusion PMHx- obesity, Rt TSA, bil TKA, CHF    PT Comments    Session very limited due to pain, lethargy, and anxiety related to pain. RN made aware.  Follow Up Recommendations  Supervision for mobility/OOB (incr pain and will continue to monitor; hopeful HHPT)     Equipment Recommendations  None recommended by PT    Recommendations for Other Services       Precautions / Restrictions Precautions Precautions: Back Required Braces or Orthoses: Spinal Brace Spinal Brace: Lumbar corset;Applied in sitting position Restrictions Weight Bearing Restrictions: No    Mobility  Bed Mobility Overal bed mobility: Needs Assistance Bed Mobility: Rolling;Sit to Sidelying Rolling: Min guard       Sit to sidelying: Mod assist General bed mobility comments: max verbal and ultimately physical cues to prevent twisting as moving sit to side; vc and guarding while rolling (+rail)  Transfers Overall transfer level: Needs assistance Equipment used: Rolling walker (2 wheeled) Transfers: Sit to/from Stand Sit to Stand: Min assist         General transfer comment: vc for safe use of RW and technique  Ambulation/Gait Ambulation/Gait assistance: Min guard Ambulation Distance (Feet): 4 Feet Assistive device: Rolling walker (2 wheeled) Gait Pattern/deviations: Step-to pattern     General Gait Details: very limited due to pain; chair to bed only   Stairs            Wheelchair Mobility    Modified Rankin (Stroke Patients Only)       Balance                                    Cognition Arousal/Alertness: Lethargic;Suspect due to medications Behavior During Therapy: Anxious Overall Cognitive Status: Within Functional Limits for tasks assessed                       Exercises      General Comments General comments (skin integrity, edema, etc.): upset re: increased pain in buttocks (similar to pain prior to surgery)      Pertinent Vitals/Pain Pain Assessment: 0-10 Pain Score: 9  Pain Location: back, buttocks Pain Descriptors / Indicators: Grimacing;Radiating Pain Intervention(s): Limited activity within patient's tolerance;Monitored during session;Premedicated before session;Repositioned    Home Living                      Prior Function            PT Goals (current goals can now be found in the care plan section) Acute Rehab PT Goals Patient Stated Goal: "behave so my back will heal and not get an infection" Time For Goal Achievement: 10/30/15 Progress towards PT goals: Not progressing toward goals - comment    Frequency  Min 5X/week    PT Plan Discharge plan needs to be updated    Co-evaluation             End of Session Equipment Utilized During Treatment: Gait belt;Back brace Activity Tolerance: Patient limited by pain Patient left: with call bell/phone within reach;in bed;with bed alarm set     Time: [id number] PT Time Calculation (min) (ACUTE ONLY): 27 min  Charges:  $Therapeutic Activity: 23-37 mins  G Codes:      [id number] 10/27/2015, 11:40 AM Pager [phone]

## 2015-10-27 NOTE — Progress Notes (Signed)
Occupational Therapy Treatment Patient Details Name: [name] MRN: [id number] DOB: 1951/04/11 Today's Date: 10/27/2015    History of present illness s/p T10-12, L2-3 laminectomies/decompression, L4-5 fusion PMHx- obesity, Rt TSA, bil TKA, CHF   OT comments  Pts progress toward OT goals limited by pain. Pt able to perform stand pivot transfer EOB <> BSC with min guard assist and increased time. Pt currently total assist for toilet hygiene in standing. Educated pt on use of reacher and provided her with one. Pt min assist for donning/doffing brace sitting EOB. At this time d/c plan remains appropriate, may need to be upgraded depending on progress. Will continue to follow acutely.    Follow Up Recommendations  Home health OT;Supervision - Intermittent    Equipment Recommendations  Tub/shower seat;Other (comment) Management consultant)    Recommendations for Other Services      Precautions / Restrictions Precautions Precautions: Back Precaution Comments: Pt able to recall 3/3 precautions and maintain throuhgout session Required Braces or Orthoses: Spinal Brace Spinal Brace: Lumbar corset;Applied in sitting position Restrictions Weight Bearing Restrictions: No       Mobility Bed Mobility Overal bed mobility: Needs Assistance Bed Mobility: Rolling;Sit to Sidelying Rolling: Min guard       Sit to sidelying: Mod assist General bed mobility comments: Assist for managing LEs into bed and for controlling descent of trunk to bed. VCs throuhgout for technique, hand placement, and maintaining precautions.  Transfers Overall transfer level: Needs assistance Equipment used: Rolling walker (2 wheeled) Transfers: Sit to/from [facility] Sit to Stand: Min guard Stand pivot transfers: Min guard       General transfer comment: VCs for hand placement and technique. Increased time required throughout secondary to pain.    Balance Overall balance assessment: Needs  assistance Sitting-balance support: Feet supported;No upper extremity supported Sitting balance-Leahy Scale: Good     Standing balance support: No upper extremity supported;During functional activity Standing balance-Leahy Scale: Fair Standing balance comment: Pt able to stand while peri care being completed without UE support                   ADL Overall ADL's : Needs assistance/impaired                 Upper Body Dressing : Minimal assistance;Sitting Upper Body Dressing Details (indicate cue type and reason): to don/doff brace sitting EOB.     Toilet Transfer: Min Statistician Details (indicate cue type and reason): Pt performing stand pivot transfer to [facility] with RN tech upon arrival. Min guard for stand pivot transfer back to bed. Toileting- Clothing Manipulation and Hygiene: Total assistance;Sit to/from stand (for toilet hygiene)       Functional mobility during ADLs: Min guard;Rolling walker (for stand pivot transfer) General ADL Comments: Pt in significant pain this session; declining further functional mobility or ADL practice. Educated pt on use of reacher and provided her with one; she verbalized understanding.      Vision                     Perception     Praxis      Cognition   Behavior During Therapy: Anxious Overall Cognitive Status: Within Functional Limits for tasks assessed                       Extremity/Trunk Assessment               Exercises  Shoulder Instructions       General Comments      Pertinent Vitals/ Pain       Pain Assessment: 0-10 Pain Score: 9  Pain Location: back Pain Descriptors / Indicators: Aching;Grimacing;Operative site guarding Pain Intervention(s): Limited activity within patient's tolerance;Monitored during session;Repositioned  Home Living                                          Prior Functioning/Environment               Frequency Min 2X/week     Progress Toward Goals  OT Goals(current goals can now be found in the care plan section)  Progress towards OT goals: Not progressing toward goals - comment (limited by pain)  Acute Rehab OT Goals Patient Stated Goal: decrease pain OT Goal Formulation: With patient  Plan Discharge plan remains appropriate    Co-evaluation                 End of Session Equipment Utilized During Treatment: Gait belt;Rolling walker;Back brace   Activity Tolerance Patient limited by pain   Patient Left in bed;with call bell/phone within reach   Nurse Communication Other (comment) (pts sister requesting to speak with RN)        [id number] OT Time Calculation (min): 20 min  Charges: OT General Charges $OT Visit: 1 Procedure OT Treatments $Self Care/Home Management : 8-22 mins  Binnie Kand M.S., OTR/L Pager: [phone] 10/27/2015, 3:42 PM

## 2015-10-27 NOTE — Progress Notes (Signed)
Pt with elevated temp. Given po acetaminophen and incentive spirometer. Education provided on using I.S. Pt demonstrates understanding. Repeat temp decreased. Will continue to monitor. Wendee Copp

## 2015-10-27 NOTE — Progress Notes (Signed)
Pt complaining of wheezing. Notified respiratory therapy to administer prn albuterol treatment. Wendee Copp

## 2015-10-27 NOTE — Progress Notes (Signed)
Patient ID: [name], female   DOB: [birth year], 65 y.o.   MRN: [id number] Subjective: Patient reports moderate back soreness, no leg pain or NTW, some flatus, some reflux and belching  Objective: Vital signs in last 24 hours: Temp:  [98.8 F (37.1 C)-102.7 F (39.3 C)] 101.3 F (38.5 C) (03/10 0541) Pulse Rate:  [65-127] 90 (03/10 0541) Resp:  [18-22] 18 (03/10 0541) BP: (74-148)/(35-115) 93/50 mmHg (03/10 0541) SpO2:  [91 %-98 %] 97 % (03/10 0843)  Intake/Output from previous day:   Intake/Output this shift:    Neurologic: Grossly normal  Lab Results: Lab Results  Component Value Date   WBC 4.3 10/17/2015   HGB 14.5 10/17/2015   HCT 43.2 10/17/2015   MCV 88.9 10/17/2015   PLT 163 10/17/2015   Lab Results  Component Value Date   INR 1.08 10/17/2015   BMET Lab Results  Component Value Date   NA 139 10/17/2015   K 3.5 10/17/2015   CL 100* 10/17/2015   CO2 29 10/17/2015   GLUCOSE 106* 10/17/2015   BUN 11 10/17/2015   CREATININE 1.29* 10/17/2015   CALCIUM 9.3 10/17/2015    Studies/Results: Dg Lumbar Spine 2-3 Views  10/25/2015  CLINICAL DATA:  L4-5 fusion EXAM: LUMBAR SPINE - 2-3 VIEW; DG C-ARM 61-120 MIN COMPARISON:  10/02/2015 FINDINGS: Status post fusion at L4-5 with transpedicular screws and connecting rounds. Interbody fusion device noted. IMPRESSION: Postoperative change as described Electronically Signed   By: Skipper Cliche M.D.   On: 10/25/2015 14:12   Dg C-arm 1-60 Min  10/25/2015  CLINICAL DATA:  L4-5 fusion EXAM: LUMBAR SPINE - 2-3 VIEW; DG C-ARM 61-120 MIN COMPARISON:  10/02/2015 FINDINGS: Status post fusion at L4-5 with transpedicular screws and connecting rounds. Interbody fusion device noted. IMPRESSION: Postoperative change as described Electronically Signed   By: Skipper Cliche M.D.   On: 10/25/2015 14:12    Assessment/Plan: Doing ok, OOB to chair today, mobilizing to BR   LOS: 2 days    [name] S 10/27/2015, 9:36 AM

## 2015-10-27 NOTE — Progress Notes (Signed)
OT Cancellation Note  Patient Details Name: [name] MRN: [id number] DOB: [birth year]   Cancelled Treatment:    Reason Eval/Treat Not Completed: Patient at procedure or test/ unavailable;Pain limiting ability to participate. Per PT, pt in significant pain and would not tolerate additional therapy at this time. In addition, transport present to take pt for test/procedure. Will follow up for OT treatment as time allows and pt is appropriate.  Binnie Kand M.S., OTR/L Pager: [phone]  10/27/2015, 10:20 AM

## 2015-10-28 DIAGNOSIS — K219 Gastro-esophageal reflux disease without esophagitis: Secondary | ICD-10-CM | POA: Diagnosis not present

## 2015-10-28 DIAGNOSIS — I509 Heart failure, unspecified: Secondary | ICD-10-CM | POA: Diagnosis not present

## 2015-10-28 DIAGNOSIS — M545 Low back pain: Secondary | ICD-10-CM | POA: Diagnosis not present

## 2015-10-28 DIAGNOSIS — M4805 Spinal stenosis, thoracolumbar region: Secondary | ICD-10-CM | POA: Diagnosis not present

## 2015-10-28 DIAGNOSIS — I10 Essential (primary) hypertension: Secondary | ICD-10-CM | POA: Diagnosis not present

## 2015-10-28 DIAGNOSIS — M4316 Spondylolisthesis, lumbar region: Secondary | ICD-10-CM | POA: Diagnosis not present

## 2015-10-28 DIAGNOSIS — E7800 Pure hypercholesterolemia, unspecified: Secondary | ICD-10-CM | POA: Diagnosis not present

## 2015-10-28 MED ORDER — DIPHENHYDRAMINE HCL 25 MG PO CAPS
25.0000 mg | ORAL_CAPSULE | Freq: Four times a day (QID) | ORAL | Status: DC | PRN
  Administered 2015-10-28 – 2015-10-29 (×2): 25 mg via ORAL
  Filled 2015-10-28 (×2): qty 1

## 2015-10-28 NOTE — Progress Notes (Signed)
Patient ID: [name], female   DOB: 06-Oct-1950, 65 y.o.   MRN: [id number] Patient doing okay still with a lot of back pain leg pain improving  Strength out of 5 incision clean dry and intact  Continue with physical outpatient therapy patient is not independent enough for discharge yet

## 2015-10-28 NOTE — Progress Notes (Addendum)
Physical Therapy Treatment and Discharge Patient Details Name: [name] MRN: [id number] DOB: Mar 25, 1951 Today's Date: 10/28/2015    History of Present Illness s/p T10-12, L2-3 laminectomies/decompression, L4-5 fusion PMHx- obesity, Rt TSA, bil TKA, CHF    PT Comments    Patient demonstrating good carryover of education re: mobility, back precautions, and use of RW. She states her family can provide the min assist (very light) PT provided during bed mobility. Anticipate no further ACUTE PT needs. Recommend HHPT for home safety evaluation and carryover of education to home environment.   Follow Up Recommendations  Home health PT;Supervision for mobility/OOB     Equipment Recommendations  None recommended by PT    Recommendations for Other Services       Precautions / Restrictions Precautions Precautions: Back Precaution Comments: Pt able to recall 3/3 precautions and required only one cue to avoid twisting at sink Required Braces or Orthoses: Spinal Brace Spinal Brace: Lumbar corset;Applied in sitting position    Mobility  Bed Mobility Overal bed mobility: Needs Assistance Bed Mobility: Rolling;Sidelying to Sit Rolling: Min assist (no rail, HOB flat) Sidelying to sit: Min guard       General bed mobility comments: pt able to verbalize correct technique; now insists she enters/exits bed on lt side (previously stated Rt); HOB flat and no rail with very light min assist to roll; close guarding to come to sit (pt used arm of chair to simulate the dresser she pushes up on at home)  Transfers Overall transfer level: Needs assistance Equipment used: Rolling walker (2 wheeled) Transfers: Sit to/from Stand Sit to Stand: Supervision         General transfer comment: no cues needed; toilet with BSC over created tight space for turning RW with back leg of RW tangling with leg of BSC (pt able to self-correct)  Ambulation/Gait Ambulation/Gait assistance: Min  guard Ambulation Distance (Feet): 110 Feet Assistive device: Rolling walker (2 wheeled) Gait Pattern/deviations: Step-through pattern;Decreased stride length   Gait velocity interpretation: Below normal speed for age/gender General Gait Details: standing rest/stretch breaks x 5 due to pain; pt likes to take hands off RW and adduct shoulder blades to stretch   Stairs            Wheelchair Mobility    Modified Rankin (Stroke Patients Only)       Balance           Standing balance support: No upper extremity supported Standing balance-Leahy Scale: Fair Standing balance comment: standing rest breaks without bil UE support                    Cognition Arousal/Alertness: Awake/alert Behavior During Therapy: Restless (due to pain) Overall Cognitive Status: Within Functional Limits for tasks assessed                      Exercises      General Comments        Pertinent Vitals/Pain Pain Assessment: 0-10 Pain Score: 4  Pain Location: "butt" Pain Descriptors / Indicators: Grimacing Pain Intervention(s): Limited activity within patient's tolerance;Monitored during session;Premedicated before session;Repositioned    Home Living                      Prior Function            PT Goals (current goals can now be found in the care plan section) Acute Rehab PT Goals Patient Stated Goal: "behave so my back  will heal and not get an infection" Time For Goal Achievement: 10/30/15 Progress towards PT goals: Progressing toward goals    Frequency  Min 5X/week    PT Plan Discharge plan needs to be updated    Co-evaluation             End of Session Equipment Utilized During Treatment: Gait belt;Back brace Activity Tolerance: Patient tolerated treatment well (despite pain) Patient left: with call bell/phone within reach;in chair;with nursing/sitter in room     Time: [id number] PT Time Calculation (min) (ACUTE ONLY): 34 min  Charges:   $Gait Training: 8-22 mins $Therapeutic Activity: 8-22 mins                    G Codes:      Wayman Hoard Nov 18, 2015, 10:36 AM Pager [phone]

## 2015-10-28 NOTE — Progress Notes (Signed)
Occupational Therapy Treatment Patient Details Name: [name] MRN: [id number] DOB: [birth year] Today's Date: 10/28/2015    History of present illness s/p T10-12, L2-3 laminectomies/decompression, L4-5 fusion PMHx- obesity, Rt TSA, bil TKA, CHF   OT comments  This 65 yo female admitted with above presents to acute OT making progress with OT and will benefit from continued OT at home in addition to her A from [facility] and family. All acute OT education completed and pt feels that she and aide can manage her basic ADLs. Acute OT will sign off.  Follow Up Recommendations  Home health OT;Supervision - Intermittent    Equipment Recommendations  Tub/shower seat;Other (comment)           Mobility Bed Mobility               General bed mobility comments: Pt up in recliner upon arrival  Transfers Overall transfer level: Needs assistance Equipment used: Rolling walker (2 wheeled) Transfers: Sit to/from Stand Sit to Stand: Supervision                  ADL Overall ADL's : Needs assistance/impaired     Grooming: Wash/dry face;Oral care;Supervision/safety;Set up;Standing Grooming Details (indicate cue type and reason): Again educated pt on use of 2 cups for oral care; pt able to return demo.                 Toilet Transfer: Supervision/safety;Ambulation;RW (recliner>sink for grooming activiites>back to recliner)         Tub/Shower Transfer Details (indicate cue type and reason): Pt and I talked about how she would do the transfe in and out her tub with A of her HHAide--using Platform arm technique for [facility] A'ing her with side step into tub (I demonstrated to her and she verbalized understanding). I offered to have her practice tub transfer tomorrow in this manner and she politely declined saying she felt that she and aide could manage this                    Cognition   Behavior During Therapy: WFL for tasks assessed/performed Overall Cognitive  Status: Within Functional Limits for tasks assessed                                    Pertinent Vitals/ Pain       Pain Assessment: 0-10 Pain Score: 6  Pain Location: buttocks Pain Descriptors / Indicators: Moaning;Grimacing;Guarding Pain Intervention(s): Monitored during session;Repositioned            Progress Toward Goals  OT Goals(current goals can now be found in the care plan section)  Progress towards OT goals:  (all education completed from a basic ADL standpoint on acute care, pt will benefit from [facility] to reinforce acute care education)     Plan Discharge plan remains appropriate       End of Session Equipment Utilized During Treatment: Gait belt;Rolling walker;Back brace   Activity Tolerance Patient tolerated treatment well   Patient Left in chair;with call bell/phone within reach;with chair alarm set   Nurse Communication          Time: [id number] OT Time Calculation (min): 38 min  Charges: OT General Charges $OT Visit: 1 Procedure OT Treatments $Self Care/Home Management : 38-52 mins  Almon Register [id number] 10/28/2015, 3:19 PM

## 2015-10-29 MED ORDER — SENNA 8.6 MG PO TABS
1.0000 | ORAL_TABLET | Freq: Every day | ORAL | Status: DC
  Administered 2015-10-29 – 2015-10-30 (×2): 8.6 mg via ORAL
  Filled 2015-10-29 (×2): qty 1

## 2015-10-29 MED ORDER — POLYETHYLENE GLYCOL 3350 17 G PO PACK
17.0000 g | PACK | Freq: Every day | ORAL | Status: DC
  Administered 2015-10-29 – 2015-10-30 (×2): 17 g via ORAL
  Filled 2015-10-29 (×2): qty 1

## 2015-10-29 NOTE — Progress Notes (Signed)
Subjective: Patient reports Doing a little better but still condition of severe back pain no leg pain  Objective: Vital signs in last 24 hours: Temp:  [98.4 F (36.9 C)-99.9 F (37.7 C)] 99.2 F (37.3 C) (03/12 0600) Pulse Rate:  [78-90] 90 (03/12 0600) Resp:  [18-20] 19 (03/12 0600) BP: (100-112)/(49-60) 100/50 mmHg (03/12 0600) SpO2:  [96 %-99 %] 99 % (03/12 0600)  Intake/Output from previous day: 03/11 0701 - 03/12 0700 In: -  Out: 4 [Urine:4] Intake/Output this shift:    Strength 5 out of 5 wound clean dry and intact  Lab Results: No results for input(s): WBC, HGB, HCT, PLT in the last 72 hours. BMET No results for input(s): NA, K, CL, CO2, GLUCOSE, BUN, CREATININE, CALCIUM in the last 72 hours.  Studies/Results: Dg Chest 2 View  10/27/2015  CLINICAL DATA:  Fever. EXAM: CHEST  2 VIEW COMPARISON:  10/25/2015 FINDINGS: Mild cardiac enlargement. No pleural effusion or edema. No airspace consolidation. Previous right shoulder arthroplasty. Spondylosis is noted within the thoracic spine. IMPRESSION: 1. No acute cardiopulmonary abnormalities. 2. Cardiac enlargement. Electronically Signed   By: Kerby Moors M.D.   On: 10/27/2015 11:12    Assessment/Plan: Continue with pain management mobilize with physical and occupational therapy  LOS: 4 days     Delroy Ordway P 10/29/2015, 9:55 AM

## 2015-10-30 MED ORDER — OXYCODONE HCL 10 MG PO TABS: 10.0000 mg | ORAL_TABLET | ORAL | Status: DC | PRN

## 2015-10-30 MED ORDER — METHOCARBAMOL 500 MG PO TABS: 500.0000 mg | ORAL_TABLET | Freq: Four times a day (QID) | ORAL | Status: DC | PRN

## 2015-10-30 NOTE — Discharge Instructions (Signed)
Spinal Fusion Spinal fusion is a procedure to make 2 or more of your back bones (vertebrae) grow together (fuse). This stops movement between the back bones. This can help decrease pain. BEFORE THE PROCEDURE  You will have a physical exam, blood tests, and imaging exams.  You will talk with the person who will give you medicine that makes you sleep (general anesthetic) during the procedure.  Ask your doctor about changing or stopping your medicines.  If you smoke, stop smoking 2 weeks before the procedure.  Do not eat or drink anything for 8 hours before the procedure. PROCEDURE  A cut (incision) is made over the back bones.  The back muscles are parted from the back bones.  If the cushion (disc) between your back bones is causing problems, it will be removed. The area where the cushion was will be filled with extra bone. Bone may be taken from another part of your body or from a bone donor. The extra bone helps your back bones grow together.  Sometimes, medicines (bone-forming proteins) are added to the area to help you heal. In most cases, screws, rods, or metal plates are used to keep the back bones in place while they grow together. Sometimes, this procedure is done from the front of the spine. In that case, a cut may be made in your side or belly (abdomen).  AFTER THE PROCEDURE  You will stay in a recovery area until you are awake. Your blood pressure and heart will be checked.  You will be given medicine (antibiotics) to prevent infection.  You may continue to get fluids through a tube in your vein (IV).  You should expect some pain after surgery. You will be given pain medicine.  You will be taught how to move, stand, and walk. While in bed, you will be told to turn often. You will be told to roll like a log, so you can move your whole body and not twist your back. Do not turn on your own until you are told to do so.   This information is not intended to replace advice given  to you by your health care provider. Make sure you discuss any questions you have with your health care provider.   Document Released: 11/29/2010 Document Revised: 10/28/2011 Document Reviewed: 01/18/2015 Elsevier Interactive Patient Education [employer].

## 2015-10-30 NOTE — Discharge Summary (Signed)
Physician Discharge Summary  Patient ID: [name] MRN: [id number] DOB/AGE: Apr 22, 1951 65 y.o.  Admit date: 10/25/2015 Discharge date: 10/30/2015  Admission Diagnoses: Thoracic stenosis, lumbar stenosis, spondylolisthesis   Discharge Diagnoses: Same   Discharged Condition: good  Hospital Course: The patient was admitted on 10/25/2015 and taken to the operating room where the patient underwent thoracic laminectomy, lumbar laminectomy, and instrumented fusion. The patient tolerated the procedure well and was taken to the recovery room and then to the floor in stable condition. The hospital course was routine. There were no complications. The wound remained clean dry and intact. Pt had appropriate back soreness. No complaints of leg pain or new N/T/W. The patient remained afebrile with stable vital signs, and tolerated a regular diet. The patient continued to increase activities, and pain was well controlled with oral pain medications.   Consults: None  Significant Diagnostic Studies:  Results for orders placed or performed during the hospital encounter of 10/25/15  Urinalysis, Routine w reflex microscopic (not at [facility])  Result Value Ref Range   Color, Urine YELLOW YELLOW   APPearance CLEAR CLEAR   Specific Gravity, Urine 1.008 1.005 - 1.030   pH 5.0 5.0 - 8.0   Glucose, UA NEGATIVE NEGATIVE mg/dL   Hgb urine dipstick TRACE (A) NEGATIVE   Bilirubin Urine NEGATIVE NEGATIVE   Ketones, ur NEGATIVE NEGATIVE mg/dL   Protein, ur NEGATIVE NEGATIVE mg/dL   Nitrite NEGATIVE NEGATIVE   Leukocytes, UA NEGATIVE NEGATIVE  Urine microscopic-add on  Result Value Ref Range   Squamous Epithelial / LPF NONE SEEN NONE SEEN   WBC, UA 0-5 0 - 5 WBC/hpf   RBC / HPF 0-5 0 - 5 RBC/hpf   Bacteria, UA NONE SEEN NONE SEEN    Dg Chest 2 View  10/27/2015  CLINICAL DATA:  Fever. EXAM: CHEST  2 VIEW COMPARISON:  10/25/2015 FINDINGS: Mild cardiac enlargement. No pleural effusion or edema. No airspace  consolidation. Previous right shoulder arthroplasty. Spondylosis is noted within the thoracic spine. IMPRESSION: 1. No acute cardiopulmonary abnormalities. 2. Cardiac enlargement. Electronically Signed   By: Kerby Moors M.D.   On: 10/27/2015 11:12   Chest 2 View  10/17/2015  CLINICAL DATA:  Spondylolisthesis. EXAM: CHEST  2 VIEW COMPARISON:  07/09/2012. FINDINGS: Mediastinum and hilar structures normal. Lungs are clear. Heart size normal. Calcified pulmonary nodule left lung base. No pleural effusion pneumothorax. Degenerative changes thoracic spine. Right shoulder replacement. IMPRESSION: 1. Calcified left lower lobe pulmonary nodule most likely a granuloma. 2. No acute abnormality. Electronically Signed   By: Marcello Moores  Register   On: 10/17/2015 12:47   Dg Lumbar Spine 2-3 Views  10/25/2015  CLINICAL DATA:  L4-5 fusion EXAM: LUMBAR SPINE - 2-3 VIEW; DG C-ARM 61-120 MIN COMPARISON:  10/02/2015 FINDINGS: Status post fusion at L4-5 with transpedicular screws and connecting rounds. Interbody fusion device noted. IMPRESSION: Postoperative change as described Electronically Signed   By: Skipper Cliche M.D.   On: 10/25/2015 14:12   Dg C-arm 1-60 Min  10/25/2015  CLINICAL DATA:  L4-5 fusion EXAM: LUMBAR SPINE - 2-3 VIEW; DG C-ARM 61-120 MIN COMPARISON:  10/02/2015 FINDINGS: Status post fusion at L4-5 with transpedicular screws and connecting rounds. Interbody fusion device noted. IMPRESSION: Postoperative change as described Electronically Signed   By: Skipper Cliche M.D.   On: 10/25/2015 14:12    Antibiotics:  Anti-infectives    Start     Dose/Rate Route Frequency Ordered Stop   10/25/15 2000  ceFAZolin (ANCEF) IVPB 1 g/50 mL  premix     1 g 100 mL/hr over 30 Minutes Intravenous Every 8 hours 10/25/15 1802 10/26/15 0447   10/25/15 1416  Vancomycin HCl POWD  Status:  Discontinued       As needed 10/25/15 1417 10/25/15 1457   10/25/15 1343  vancomycin (VANCOCIN) 1000 MG powder    Comments:  Atilano Median   : cabinet override      10/25/15 1343 10/26/15 0159   10/25/15 1311  bacitracin 50,000 Units in sodium chloride irrigation 0.9 % 500 mL irrigation  Status:  Discontinued       As needed 10/25/15 1312 10/25/15 1457   10/25/15 1100  ceFAZolin (ANCEF) 3 g in dextrose 5 % 50 mL IVPB     3 g 130 mL/hr over 30 Minutes Intravenous To [facility] 10/24/15 1321 10/25/15 1122      Discharge Exam: Blood pressure 123/59, pulse 78, temperature 98.2 F (36.8 C), temperature source Oral, resp. rate 16, height 5\' 3"  (1.6 m), weight 122.2 kg (269 lb 6.4 oz), SpO2 96 %. Neurologic: Grossly normal Dressing dry  Discharge Medications:     Medication List    STOP taking these medications        traMADol 50 MG tablet  Commonly known as:  ULTRAM      TAKE these medications        albuterol 108 (90 Base) MCG/ACT inhaler  Commonly known as:  PROVENTIL HFA;VENTOLIN HFA  Inhale 2 puffs into the lungs every 6 (six) hours as needed for wheezing.     albuterol (2.5 MG/3ML) 0.083% nebulizer solution  Commonly known as:  PROVENTIL  Take 2.5 mg by nebulization every 6 (six) hours as needed for wheezing.     citalopram 40 MG tablet  Commonly known as:  CELEXA  Take 40 mg by mouth daily.     cloNIDine 0.3 mg/24hr patch  Commonly known as:  CATAPRES - Dosed in mg/24 hr  Place 0.3 mg onto the skin every Thursday.     diphenoxylate-atropine 2.5-0.025 MG tablet  Commonly known as:  LOMOTIL  Take 1 tablet by mouth daily as needed. Upset stomach     furosemide 80 MG tablet  Commonly known as:  LASIX  Take 80 mg by mouth daily.     losartan-hydrochlorothiazide 100-12.5 MG tablet  Commonly known as:  HYZAAR  Take 1 tablet by mouth daily.     methocarbamol 500 MG tablet  Commonly known as:  ROBAXIN  Take 1 tablet (500 mg total) by mouth every 6 (six) hours as needed for muscle spasms.     metoprolol tartrate 25 MG tablet  Commonly known as:  LOPRESSOR  Take 0.5 tablets (12.5 mg  total) by mouth 2 (two) times daily.     NEXIUM 40 MG capsule  Generic drug:  esomeprazole  Take 1 tablet by mouth daily.     Oxycodone HCl 10 MG Tabs  Take 1 tablet (10 mg total) by mouth every 4 (four) hours as needed.     potassium chloride 10 MEQ tablet  Commonly known as:  K-DUR  Take 20 mEq by mouth daily.     potassium chloride SA 20 MEQ tablet  Commonly known as:  K-DUR,KLOR-CON  Take 20 mEq by mouth 2 (two) times daily.     simvastatin 20 MG tablet  Commonly known as:  ZOCOR  Take 20 mg by mouth every evening.     VIBERZI 75 MG Tabs  Generic drug:  Eluxadoline  Take  75 mg by mouth 2 (two) times daily.     Vitamin D (Ergocalciferol) 50000 units Caps capsule  Commonly known as:  DRISDOL  Take 50,000 Units by mouth every 7 (seven) days. On Wednesday        Disposition: Home   Final Dx: Thoracic laminectomy, lumbar laminectomy, L4-5 instrumented fusion      Discharge Instructions    Call MD for:  difficulty breathing, headache or visual disturbances    Complete by:  As directed      Call MD for:  persistant nausea and vomiting    Complete by:  As directed      Call MD for:  redness, tenderness, or signs of infection (pain, swelling, redness, odor or green/yellow discharge around incision site)    Complete by:  As directed      Call MD for:  severe uncontrolled pain    Complete by:  As directed      Call MD for:  temperature >100.4    Complete by:  As directed      Diet - low sodium heart healthy    Complete by:  As directed      Discharge instructions    Complete by:  As directed   No bending or driving, no heavy lifting, may shower     Increase activity slowly    Complete by:  As directed      Remove dressing in 48 hours    Complete by:  As directed            Follow-up Information    Follow up with Taelyr Jantz S, MD. Schedule an appointment as soon as possible for a visit in 2 weeks.   Specialty:  Neurosurgery   Contact information:   [address] [place] [address] [phone]        Signed: Eustace Moore 10/30/2015, 8:24 AM

## 2015-10-30 NOTE — Progress Notes (Signed)
Patient escorted by nurse tech and volunteer to car with home health equipment

## 2015-10-30 NOTE — Progress Notes (Signed)
Patient left dme chair at bedside. Per Vida Roller, case management, patient was called to pick up chair. Chair is in Chief Operating Officer with patient's name.

## 2015-10-30 NOTE — Care Management Note (Addendum)
Case Management Note  Patient Details  Name: CALIDA SOUHRADA MRN: [id number] Date of Birth: April 25, 1951  Subjective/Objective:                    Action/Plan: Pt discharged to home. Patient already set up with her equipment. Pt stating she has Medicare on top of her Medicaid that is listed for her insurance. Pt unable to provide a card here at the hospital but wants to call with number when she returns home.  CM will continue to follow if patient calls with Medicare number and HH able to take the number. Bedside RN updated.   AddendumColletta Maryland with [facility] called and able to verify Ms [facility] number. Ms Starrett asked to use [facility] for her home therapies so CM informed Colletta Maryland and [facility] will follow the patient at home.   Expected Discharge Date:                  Expected Discharge Plan:     In-House Referral:     Discharge planning Services     Post Acute Care Choice:    Choice offered to:     DME Arranged:    DME Agency:     HH Arranged:    HH Agency:     Status of Service:  In process, will continue to follow  Medicare Important Message Given:    Date Medicare IM Given:    Medicare IM give by:    Date Additional Medicare IM Given:    Additional Medicare Important Message give by:     If discussed at [place] of Stay Meetings, dates discussed:    Additional Comments:  Pollie Friar, RN 10/30/2015, 11:11 AM

## 2015-10-30 NOTE — Progress Notes (Signed)
Discharge orders received. RN discussed discharge instructions with patient including follow up appointment with neurosurgery, per patient, she has already scheduled appointment. Medications reviewed, prescription for oxy ir given to patient, patient aware that robaxin rx is at [facility] pharmacy, medications given per patient request. Patient's honeycomb dressing intact, scant drainage. Educated regarding wound care, to remove dressing in 48 hr per md order, no baths, quick showers once honeycomb dressing is removed. Patient has home health equipment in room, waiting for care management for home health information. Patient also waiting for ride, to be escorted by wheelchair to car once transportation arrives. Neuro assessment unchanged, patient is alert, oriented x4, appropriate baseline, equal grips. Will continue to monitor until transportation

## 2015-11-02 ENCOUNTER — Emergency Department (HOSPITAL_COMMUNITY): Payer: Medicare Other | Admitting: Certified Registered Nurse Anesthetist

## 2015-11-02 ENCOUNTER — Inpatient Hospital Stay (HOSPITAL_COMMUNITY)
Admission: EM | Admit: 2015-11-02 | Discharge: 2015-11-09 | DRG: 863 | Disposition: A | Discharge status: Home Health | Payer: Medicare Other | Attending: Neurosurgery | Admitting: Neurosurgery

## 2015-11-02 ENCOUNTER — Encounter (HOSPITAL_COMMUNITY): Payer: Self-pay | Admitting: Neurological Surgery

## 2015-11-02 ENCOUNTER — Encounter (HOSPITAL_COMMUNITY)
Admission: EM | Disposition: A | Discharge status: Home Health | Payer: Self-pay | Source: Home / Self Care | Attending: Neurosurgery

## 2015-11-02 ENCOUNTER — Inpatient Hospital Stay: Admit: 2015-11-02 | Payer: Self-pay | Admitting: Neurosurgery

## 2015-11-02 DIAGNOSIS — K580 Irritable bowel syndrome with diarrhea: Secondary | ICD-10-CM | POA: Diagnosis present

## 2015-11-02 DIAGNOSIS — Z96653 Presence of artificial knee joint, bilateral: Secondary | ICD-10-CM | POA: Diagnosis not present

## 2015-11-02 DIAGNOSIS — I10 Essential (primary) hypertension: Secondary | ICD-10-CM | POA: Diagnosis not present

## 2015-11-02 DIAGNOSIS — B964 Proteus (mirabilis) (morganii) as the cause of diseases classified elsewhere: Secondary | ICD-10-CM | POA: Diagnosis not present

## 2015-11-02 DIAGNOSIS — Y838 Other surgical procedures as the cause of abnormal reaction of the patient, or of later complication, without mention of misadventure at the time of the procedure: Secondary | ICD-10-CM | POA: Diagnosis present

## 2015-11-02 DIAGNOSIS — E7800 Pure hypercholesterolemia, unspecified: Secondary | ICD-10-CM | POA: Diagnosis present

## 2015-11-02 DIAGNOSIS — Z79899 Other long term (current) drug therapy: Secondary | ICD-10-CM | POA: Diagnosis not present

## 2015-11-02 DIAGNOSIS — T8149XA Infection following a procedure, other surgical site, initial encounter: Secondary | ICD-10-CM | POA: Diagnosis present

## 2015-11-02 DIAGNOSIS — Z886 Allergy status to analgesic agent status: Secondary | ICD-10-CM

## 2015-11-02 DIAGNOSIS — S34104D Unspecified injury to L4 level of lumbar spinal cord, subsequent encounter: Secondary | ICD-10-CM | POA: Diagnosis not present

## 2015-11-02 DIAGNOSIS — F419 Anxiety disorder, unspecified: Secondary | ICD-10-CM | POA: Diagnosis not present

## 2015-11-02 DIAGNOSIS — Z885 Allergy status to narcotic agent status: Secondary | ICD-10-CM

## 2015-11-02 DIAGNOSIS — T814XXA Infection following a procedure, initial encounter: Principal | ICD-10-CM | POA: Diagnosis present

## 2015-11-02 DIAGNOSIS — Z981 Arthrodesis status: Secondary | ICD-10-CM

## 2015-11-02 DIAGNOSIS — Z881 Allergy status to other antibiotic agents status: Secondary | ICD-10-CM

## 2015-11-02 DIAGNOSIS — K219 Gastro-esophageal reflux disease without esophagitis: Secondary | ICD-10-CM | POA: Diagnosis not present

## 2015-11-02 DIAGNOSIS — S31000D Unspecified open wound of lower back and pelvis without penetration into retroperitoneum, subsequent encounter: Secondary | ICD-10-CM | POA: Diagnosis not present

## 2015-11-02 DIAGNOSIS — G8929 Other chronic pain: Secondary | ICD-10-CM | POA: Diagnosis not present

## 2015-11-02 DIAGNOSIS — Z91013 Allergy to seafood: Secondary | ICD-10-CM | POA: Diagnosis not present

## 2015-11-02 DIAGNOSIS — N179 Acute kidney failure, unspecified: Secondary | ICD-10-CM | POA: Diagnosis not present

## 2015-11-02 DIAGNOSIS — K589 Irritable bowel syndrome without diarrhea: Secondary | ICD-10-CM | POA: Diagnosis not present

## 2015-11-02 DIAGNOSIS — Z8249 Family history of ischemic heart disease and other diseases of the circulatory system: Secondary | ICD-10-CM

## 2015-11-02 DIAGNOSIS — J42 Unspecified chronic bronchitis: Secondary | ICD-10-CM | POA: Diagnosis not present

## 2015-11-02 DIAGNOSIS — B961 Klebsiella pneumoniae [K. pneumoniae] as the cause of diseases classified elsewhere: Secondary | ICD-10-CM | POA: Diagnosis present

## 2015-11-02 DIAGNOSIS — Z91018 Allergy to other foods: Secondary | ICD-10-CM

## 2015-11-02 DIAGNOSIS — T8140XA Infection following a procedure, unspecified, initial encounter: Secondary | ICD-10-CM

## 2015-11-02 DIAGNOSIS — Z91041 Radiographic dye allergy status: Secondary | ICD-10-CM | POA: Diagnosis not present

## 2015-11-02 DIAGNOSIS — T814XXD Infection following a procedure, subsequent encounter: Secondary | ICD-10-CM | POA: Diagnosis not present

## 2015-11-02 DIAGNOSIS — R609 Edema, unspecified: Secondary | ICD-10-CM | POA: Diagnosis not present

## 2015-11-02 DIAGNOSIS — I509 Heart failure, unspecified: Secondary | ICD-10-CM | POA: Diagnosis not present

## 2015-11-02 DIAGNOSIS — M545 Low back pain: Secondary | ICD-10-CM | POA: Diagnosis present

## 2015-11-02 DIAGNOSIS — Z91048 Other nonmedicinal substance allergy status: Secondary | ICD-10-CM | POA: Diagnosis not present

## 2015-11-02 DIAGNOSIS — B999 Unspecified infectious disease: Secondary | ICD-10-CM | POA: Diagnosis not present

## 2015-11-02 DIAGNOSIS — S34105D Unspecified injury to L5 level of lumbar spinal cord, subsequent encounter: Secondary | ICD-10-CM | POA: Diagnosis not present

## 2015-11-02 HISTORY — PX: LUMBAR WOUND DEBRIDEMENT: SHX1988

## 2015-11-02 LAB — CBC
HCT: 30.4 % — ABNORMAL LOW (ref 36.0–46.0)
Hemoglobin: 10 g/dL — ABNORMAL LOW (ref 12.0–15.0)
MCH: 29.2 pg (ref 26.0–34.0)
MCHC: 32.9 g/dL (ref 30.0–36.0)
MCV: 88.9 fL (ref 78.0–100.0)
PLATELETS: 241 10*3/uL (ref 150–400)
RBC: 3.42 MIL/uL — ABNORMAL LOW (ref 3.87–5.11)
RDW: 12.6 % (ref 11.5–15.5)
WBC: 6.9 10*3/uL (ref 4.0–10.5)

## 2015-11-02 LAB — BASIC METABOLIC PANEL
ANION GAP: 14 (ref 5–15)
BUN: 9 mg/dL (ref 6–20)
CALCIUM: 8.2 mg/dL — AB (ref 8.9–10.3)
CO2: 31 mmol/L (ref 22–32)
Chloride: 91 mmol/L — ABNORMAL LOW (ref 101–111)
Creatinine, Ser: 1.21 mg/dL — ABNORMAL HIGH (ref 0.44–1.00)
GFR calc Af Amer: 53 mL/min — ABNORMAL LOW (ref >60)
GFR, EST NON AFRICAN AMERICAN: 46 mL/min — AB (ref >60)
GLUCOSE: 176 mg/dL — AB (ref 65–99)
Potassium: 2.6 mmol/L — CL (ref 3.5–5.1)
SODIUM: 136 mmol/L (ref 135–145)

## 2015-11-02 LAB — GLUCOSE, CAPILLARY: Glucose-Capillary: 153 mg/dL — ABNORMAL HIGH (ref 65–99)

## 2015-11-02 SURGERY — LUMBAR WOUND DEBRIDEMENT
Anesthesia: General

## 2015-11-02 MED ORDER — INSULIN ASPART 100 UNIT/ML ~~LOC~~ SOLN: 0.0000 [IU] | Freq: Every day | SUBCUTANEOUS | Status: DC

## 2015-11-02 MED ORDER — ACETAMINOPHEN 650 MG RE SUPP: 650.0000 mg | RECTAL | Status: DC | PRN

## 2015-11-02 MED ORDER — METOPROLOL TARTRATE 12.5 MG HALF TABLET
12.5000 mg | ORAL_TABLET | Freq: Two times a day (BID) | ORAL | Status: DC
  Administered 2015-11-03 – 2015-11-09 (×12): 12.5 mg via ORAL
  Filled 2015-11-02 (×13): qty 1

## 2015-11-02 MED ORDER — LACTATED RINGERS IV SOLN
INTRAVENOUS | Status: DC | PRN
  Administered 2015-11-02: 14:00:00 via INTRAVENOUS

## 2015-11-02 MED ORDER — PROPOFOL 10 MG/ML IV BOLUS
INTRAVENOUS | Status: DC | PRN
  Administered 2015-11-02: 150 mg via INTRAVENOUS
  Administered 2015-11-02: 30 mg via INTRAVENOUS

## 2015-11-02 MED ORDER — MORPHINE SULFATE (PF) 2 MG/ML IV SOLN
1.0000 mg | INTRAVENOUS | Status: DC | PRN
  Administered 2015-11-03 – 2015-11-07 (×5): 2 mg via INTRAVENOUS
  Filled 2015-11-02 (×5): qty 1

## 2015-11-02 MED ORDER — VANCOMYCIN HCL 1000 MG IV SOLR
INTRAVENOUS | Status: AC
  Filled 2015-11-02: qty 1000

## 2015-11-02 MED ORDER — ELUXADOLINE 75 MG PO TABS: 75.0000 mg | ORAL_TABLET | Freq: Two times a day (BID) | ORAL | Status: DC

## 2015-11-02 MED ORDER — SODIUM CHLORIDE 0.9% FLUSH: 3.0000 mL | INTRAVENOUS | Status: DC | PRN

## 2015-11-02 MED ORDER — CITALOPRAM HYDROBROMIDE 40 MG PO TABS
40.0000 mg | ORAL_TABLET | Freq: Every day | ORAL | Status: DC
  Administered 2015-11-03 – 2015-11-09 (×7): 40 mg via ORAL
  Filled 2015-11-02 (×7): qty 1

## 2015-11-02 MED ORDER — THROMBIN 5000 UNITS EX SOLR
OROMUCOSAL | Status: DC | PRN
  Administered 2015-11-02: 14:00:00 via TOPICAL

## 2015-11-02 MED ORDER — PHENYLEPHRINE HCL 10 MG/ML IJ SOLN
10.0000 mg | INTRAVENOUS | Status: DC | PRN
  Administered 2015-11-02: 25 ug/min via INTRAVENOUS

## 2015-11-02 MED ORDER — FENTANYL CITRATE (PF) 100 MCG/2ML IJ SOLN
INTRAMUSCULAR | Status: DC | PRN
  Administered 2015-11-02: 25 ug via INTRAVENOUS
  Administered 2015-11-02: 100 ug via INTRAVENOUS
  Administered 2015-11-02: 25 ug via INTRAVENOUS

## 2015-11-02 MED ORDER — POTASSIUM CHLORIDE 10 MEQ/100ML IV SOLN
INTRAVENOUS | Status: DC
Start: 2015-11-02 — End: 2015-11-02
  Filled 2015-11-02: qty 100

## 2015-11-02 MED ORDER — DIAZEPAM 5 MG PO TABS
ORAL_TABLET | ORAL | Status: AC
  Filled 2015-11-02: qty 1

## 2015-11-02 MED ORDER — POTASSIUM CHLORIDE CRYS ER 20 MEQ PO TBCR
20.0000 meq | EXTENDED_RELEASE_TABLET | Freq: Two times a day (BID) | ORAL | Status: DC
  Administered 2015-11-02 – 2015-11-09 (×14): 20 meq via ORAL
  Filled 2015-11-02 (×14): qty 1

## 2015-11-02 MED ORDER — SUCCINYLCHOLINE CHLORIDE 20 MG/ML IJ SOLN
INTRAMUSCULAR | Status: DC | PRN
  Administered 2015-11-02: 100 mg via INTRAVENOUS

## 2015-11-02 MED ORDER — INSULIN ASPART 100 UNIT/ML ~~LOC~~ SOLN: 0.0000 [IU] | Freq: Three times a day (TID) | SUBCUTANEOUS | Status: DC

## 2015-11-02 MED ORDER — HEMOSTATIC AGENTS (NO CHARGE) OPTIME
TOPICAL | Status: DC | PRN
  Administered 2015-11-02: 1 via TOPICAL

## 2015-11-02 MED ORDER — SENNA 8.6 MG PO TABS
1.0000 | ORAL_TABLET | Freq: Two times a day (BID) | ORAL | Status: DC
  Administered 2015-11-02 – 2015-11-08 (×6): 8.6 mg via ORAL
  Filled 2015-11-02 (×11): qty 1

## 2015-11-02 MED ORDER — ROCURONIUM BROMIDE 100 MG/10ML IV SOLN
INTRAVENOUS | Status: DC | PRN
  Administered 2015-11-02: 20 mg via INTRAVENOUS

## 2015-11-02 MED ORDER — SODIUM CHLORIDE 0.9 % IV SOLN
INTRAVENOUS | Status: DC
  Administered 2015-11-02 – 2015-11-08 (×7): via INTRAVENOUS

## 2015-11-02 MED ORDER — SODIUM CHLORIDE 0.9% FLUSH
3.0000 mL | Freq: Two times a day (BID) | INTRAVENOUS | Status: DC
  Administered 2015-11-03 – 2015-11-05 (×4): 3 mL via INTRAVENOUS

## 2015-11-02 MED ORDER — ONDANSETRON HCL 4 MG/2ML IJ SOLN
INTRAMUSCULAR | Status: DC | PRN
  Administered 2015-11-02: 4 mg via INTRAVENOUS

## 2015-11-02 MED ORDER — LOSARTAN POTASSIUM-HCTZ 100-12.5 MG PO TABS: 1.0000 | ORAL_TABLET | Freq: Every day | ORAL | Status: DC

## 2015-11-02 MED ORDER — GLYCOPYRROLATE 0.2 MG/ML IJ SOLN
INTRAMUSCULAR | Status: DC | PRN
  Administered 2015-11-02: .4 mg via INTRAVENOUS

## 2015-11-02 MED ORDER — CEFAZOLIN SODIUM 1-5 GM-% IV SOLN
1.0000 g | Freq: Three times a day (TID) | INTRAVENOUS | Status: AC
  Administered 2015-11-02 – 2015-11-03 (×2): 1 g via INTRAVENOUS
  Filled 2015-11-02 (×2): qty 50

## 2015-11-02 MED ORDER — KETOROLAC TROMETHAMINE 30 MG/ML IJ SOLN
INTRAMUSCULAR | Status: DC | PRN
  Administered 2015-11-02: 30 mg via INTRAVENOUS

## 2015-11-02 MED ORDER — PHENOL 1.4 % MT LIQD: 1.0000 | OROMUCOSAL | Status: DC | PRN

## 2015-11-02 MED ORDER — ALBUTEROL SULFATE (2.5 MG/3ML) 0.083% IN NEBU
2.5000 mg | INHALATION_SOLUTION | Freq: Four times a day (QID) | RESPIRATORY_TRACT | Status: DC | PRN
Start: 2015-11-02 — End: 2015-11-09

## 2015-11-02 MED ORDER — PHENYLEPHRINE HCL 10 MG/ML IJ SOLN
INTRAMUSCULAR | Status: DC | PRN
  Administered 2015-11-02 (×2): 80 ug via INTRAVENOUS
  Administered 2015-11-02: 40 ug via INTRAVENOUS
  Administered 2015-11-02 (×3): 80 ug via INTRAVENOUS

## 2015-11-02 MED ORDER — FENTANYL CITRATE (PF) 250 MCG/5ML IJ SOLN
INTRAMUSCULAR | Status: AC
  Filled 2015-11-02: qty 5

## 2015-11-02 MED ORDER — OXYCODONE-ACETAMINOPHEN 5-325 MG PO TABS
1.0000 | ORAL_TABLET | ORAL | Status: DC | PRN
  Administered 2015-11-02: 1 via ORAL
  Administered 2015-11-03: 2 via ORAL
  Administered 2015-11-03: 1 via ORAL
  Administered 2015-11-04 – 2015-11-07 (×11): 2 via ORAL
  Administered 2015-11-07 (×2): 1 via ORAL
  Administered 2015-11-07 – 2015-11-09 (×8): 2 via ORAL
  Filled 2015-11-02 (×2): qty 2
  Filled 2015-11-02: qty 1
  Filled 2015-11-02 (×6): qty 2
  Filled 2015-11-02: qty 1
  Filled 2015-11-02: qty 2
  Filled 2015-11-02: qty 1
  Filled 2015-11-02 (×2): qty 2
  Filled 2015-11-02: qty 1
  Filled 2015-11-02 (×9): qty 2

## 2015-11-02 MED ORDER — HYDROMORPHONE HCL 1 MG/ML IJ SOLN
INTRAMUSCULAR | Status: AC
  Filled 2015-11-02: qty 1

## 2015-11-02 MED ORDER — PANTOPRAZOLE SODIUM 40 MG PO TBEC
40.0000 mg | DELAYED_RELEASE_TABLET | Freq: Every day | ORAL | Status: DC
  Administered 2015-11-03 – 2015-11-09 (×7): 40 mg via ORAL
  Filled 2015-11-02 (×6): qty 1

## 2015-11-02 MED ORDER — SODIUM CHLORIDE 0.9 % IV SOLN: 250.0000 mL | INTRAVENOUS | Status: DC

## 2015-11-02 MED ORDER — 0.9 % SODIUM CHLORIDE (POUR BTL) OPTIME
TOPICAL | Status: DC | PRN
  Administered 2015-11-02: 1000 mL

## 2015-11-02 MED ORDER — BACITRACIN ZINC 500 UNIT/GM EX OINT
TOPICAL_OINTMENT | CUTANEOUS | Status: DC | PRN
  Administered 2015-11-02: 1 via TOPICAL

## 2015-11-02 MED ORDER — ESMOLOL HCL 100 MG/10ML IV SOLN
INTRAVENOUS | Status: AC
  Filled 2015-11-02: qty 10

## 2015-11-02 MED ORDER — DEXAMETHASONE SODIUM PHOSPHATE 4 MG/ML IJ SOLN
INTRAMUSCULAR | Status: DC | PRN
  Administered 2015-11-02: 4 mg via INTRAVENOUS

## 2015-11-02 MED ORDER — ONDANSETRON HCL 4 MG/2ML IJ SOLN
4.0000 mg | INTRAMUSCULAR | Status: DC | PRN
Start: 2015-11-02 — End: 2015-11-09

## 2015-11-02 MED ORDER — THROMBIN 5000 UNITS EX SOLR
CUTANEOUS | Status: DC | PRN
  Administered 2015-11-02 (×3): 5000 [IU] via TOPICAL

## 2015-11-02 MED ORDER — FUROSEMIDE 80 MG PO TABS
80.0000 mg | ORAL_TABLET | Freq: Every day | ORAL | Status: DC
  Administered 2015-11-03 – 2015-11-08 (×5): 80 mg via ORAL
  Filled 2015-11-02 (×7): qty 1

## 2015-11-02 MED ORDER — ALBUTEROL SULFATE (2.5 MG/3ML) 0.083% IN NEBU: 2.5000 mg | INHALATION_SOLUTION | Freq: Four times a day (QID) | RESPIRATORY_TRACT | Status: DC | PRN

## 2015-11-02 MED ORDER — HYDROMORPHONE HCL 1 MG/ML IJ SOLN
0.2500 mg | INTRAMUSCULAR | Status: DC | PRN
  Administered 2015-11-02 (×4): 0.5 mg via INTRAVENOUS

## 2015-11-02 MED ORDER — KETOROLAC TROMETHAMINE 30 MG/ML IJ SOLN
INTRAMUSCULAR | Status: AC
  Filled 2015-11-02: qty 1

## 2015-11-02 MED ORDER — NEOSTIGMINE METHYLSULFATE 10 MG/10ML IV SOLN
INTRAVENOUS | Status: DC | PRN
  Administered 2015-11-02: 3 mg via INTRAVENOUS

## 2015-11-02 MED ORDER — VANCOMYCIN HCL IN DEXTROSE 1-5 GM/200ML-% IV SOLN
INTRAVENOUS | Status: AC
  Filled 2015-11-02: qty 400

## 2015-11-02 MED ORDER — VANCOMYCIN HCL 1000 MG IV SOLR
INTRAVENOUS | Status: DC
Start: 2015-11-02 — End: 2015-11-02
  Filled 2015-11-02: qty 1000

## 2015-11-02 MED ORDER — MENTHOL 3 MG MT LOZG: 1.0000 | LOZENGE | OROMUCOSAL | Status: DC | PRN

## 2015-11-02 MED ORDER — PROPOFOL 10 MG/ML IV BOLUS
INTRAVENOUS | Status: AC
  Filled 2015-11-02: qty 20

## 2015-11-02 MED ORDER — SIMVASTATIN 20 MG PO TABS
20.0000 mg | ORAL_TABLET | Freq: Every evening | ORAL | Status: DC
  Administered 2015-11-02 – 2015-11-08 (×7): 20 mg via ORAL
  Filled 2015-11-02 (×7): qty 1

## 2015-11-02 MED ORDER — METHOCARBAMOL 500 MG PO TABS
500.0000 mg | ORAL_TABLET | Freq: Four times a day (QID) | ORAL | Status: DC | PRN
  Administered 2015-11-02 – 2015-11-09 (×11): 500 mg via ORAL
  Filled 2015-11-02 (×11): qty 1

## 2015-11-02 MED ORDER — DIAZEPAM 5 MG PO TABS
5.0000 mg | ORAL_TABLET | Freq: Four times a day (QID) | ORAL | Status: DC | PRN
  Administered 2015-11-02 – 2015-11-09 (×7): 5 mg via ORAL
  Filled 2015-11-02 (×6): qty 1

## 2015-11-02 MED ORDER — POTASSIUM CHLORIDE ER 10 MEQ PO TBCR
20.0000 meq | EXTENDED_RELEASE_TABLET | Freq: Every day | ORAL | Status: DC
  Filled 2015-11-02: qty 2

## 2015-11-02 MED ORDER — CEFAZOLIN SODIUM-DEXTROSE 2-3 GM-% IV SOLR
INTRAVENOUS | Status: AC
  Filled 2015-11-02: qty 50

## 2015-11-02 MED ORDER — ACETAMINOPHEN 325 MG PO TABS
650.0000 mg | ORAL_TABLET | ORAL | Status: DC | PRN
  Administered 2015-11-03 (×2): 650 mg via ORAL
  Filled 2015-11-02 (×2): qty 2

## 2015-11-02 MED ORDER — POTASSIUM CHLORIDE 10 MEQ/100ML IV SOLN
10.0000 meq | INTRAVENOUS | Status: AC
  Administered 2015-11-02 (×2): 10 meq via INTRAVENOUS
  Filled 2015-11-02 (×3): qty 100

## 2015-11-02 MED ORDER — CLONIDINE HCL 0.3 MG/24HR TD PTWK
0.3000 mg | MEDICATED_PATCH | TRANSDERMAL | Status: DC
  Administered 2015-11-09: 0.3 mg via TRANSDERMAL
  Filled 2015-11-02 (×2): qty 1

## 2015-11-02 MED ORDER — ACETAMINOPHEN 10 MG/ML IV SOLN
INTRAVENOUS | Status: AC
  Administered 2015-11-02: 1000 mg via INTRAVENOUS
  Filled 2015-11-02: qty 100

## 2015-11-02 MED ORDER — POTASSIUM CHLORIDE 10 MEQ/100ML IV SOLN
10.0000 meq | INTRAVENOUS | Status: AC
Start: 2015-11-02 — End: 2015-11-02
  Administered 2015-11-02 (×2): 10 meq via INTRAVENOUS
  Filled 2015-11-02: qty 100

## 2015-11-02 MED ORDER — LIDOCAINE HCL (CARDIAC) 20 MG/ML IV SOLN
INTRAVENOUS | Status: DC | PRN
  Administered 2015-11-02: 60 mg via INTRAVENOUS

## 2015-11-02 SURGICAL SUPPLY — 61 items
APL SKNCLS STERI-STRIP NONHPOA (GAUZE/BANDAGES/DRESSINGS)
BENZOIN TINCTURE PRP APPL 2/3 (GAUZE/BANDAGES/DRESSINGS) IMPLANT
BLADE CLIPPER SURG (BLADE) IMPLANT
CANISTER SUCT 3000ML PPV (MISCELLANEOUS) ×3 IMPLANT
CLOSURE WOUND 1/2 X4 (GAUZE/BANDAGES/DRESSINGS)
DRAPE INCISE IOBAN 66X45 STRL (DRAPES) ×2 IMPLANT
DRAPE LAPAROTOMY 100X72X124 (DRAPES) ×3 IMPLANT
DRAPE POUCH INSTRU U-SHP 10X18 (DRAPES) ×3 IMPLANT
DRSG OPSITE POSTOP 4X10 (GAUZE/BANDAGES/DRESSINGS) ×2 IMPLANT
DRSG OPSITE POSTOP 4X6 (GAUZE/BANDAGES/DRESSINGS) ×2 IMPLANT
DURAPREP 26ML APPLICATOR (WOUND CARE) IMPLANT
ELECT REM PT RETURN 9FT ADLT (ELECTROSURGICAL) ×3
ELECTRODE REM PT RTRN 9FT ADLT (ELECTROSURGICAL) ×1 IMPLANT
GAUZE SPONGE 4X4 12PLY STRL (GAUZE/BANDAGES/DRESSINGS) ×3 IMPLANT
GAUZE SPONGE 4X4 16PLY XRAY LF (GAUZE/BANDAGES/DRESSINGS) IMPLANT
GLOVE BIO SURGEON STRL SZ 6.5 (GLOVE) IMPLANT
GLOVE BIO SURGEON STRL SZ7 (GLOVE) IMPLANT
GLOVE BIO SURGEON STRL SZ7.5 (GLOVE) IMPLANT
GLOVE BIO SURGEON STRL SZ8 (GLOVE) IMPLANT
GLOVE BIO SURGEON STRL SZ8.5 (GLOVE) IMPLANT
GLOVE BIO SURGEONS STRL SZ 6.5 (GLOVE)
GLOVE BIOGEL M 8.0 STRL (GLOVE) ×3 IMPLANT
GLOVE ECLIPSE 6.5 STRL STRAW (GLOVE) IMPLANT
GLOVE ECLIPSE 7.0 STRL STRAW (GLOVE) IMPLANT
GLOVE ECLIPSE 7.5 STRL STRAW (GLOVE) IMPLANT
GLOVE ECLIPSE 8.0 STRL XLNG CF (GLOVE) IMPLANT
GLOVE ECLIPSE 8.5 STRL (GLOVE) IMPLANT
GLOVE EXAM NITRILE LRG STRL (GLOVE) IMPLANT
GLOVE EXAM NITRILE MD LF STRL (GLOVE) IMPLANT
GLOVE EXAM NITRILE XL STR (GLOVE) IMPLANT
GLOVE EXAM NITRILE XS STR PU (GLOVE) IMPLANT
GLOVE INDICATOR 6.5 STRL GRN (GLOVE) IMPLANT
GLOVE INDICATOR 7.0 STRL GRN (GLOVE) IMPLANT
GLOVE INDICATOR 7.5 STRL GRN (GLOVE) ×6 IMPLANT
GLOVE INDICATOR 8.0 STRL GRN (GLOVE) IMPLANT
GLOVE INDICATOR 8.5 STRL (GLOVE) IMPLANT
GLOVE OPTIFIT SS 8.0 STRL (GLOVE) IMPLANT
GLOVE SURG SS PI 6.5 STRL IVOR (GLOVE) IMPLANT
GLOVE SURG SS PI 7.0 STRL IVOR (GLOVE) ×2 IMPLANT
GOWN STRL REUS W/ TWL LRG LVL3 (GOWN DISPOSABLE) ×1 IMPLANT
GOWN STRL REUS W/ TWL XL LVL3 (GOWN DISPOSABLE) IMPLANT
GOWN STRL REUS W/TWL 2XL LVL3 (GOWN DISPOSABLE) IMPLANT
GOWN STRL REUS W/TWL LRG LVL3 (GOWN DISPOSABLE) ×3
GOWN STRL REUS W/TWL XL LVL3 (GOWN DISPOSABLE)
KIT BASIN OR (CUSTOM PROCEDURE TRAY) ×3 IMPLANT
KIT ROOM TURNOVER OR (KITS) ×3 IMPLANT
NS IRRIG 1000ML POUR BTL (IV SOLUTION) ×3 IMPLANT
PACK LAMINECTOMY NEURO (CUSTOM PROCEDURE TRAY) ×3 IMPLANT
PAD ARMBOARD 7.5X6 YLW CONV (MISCELLANEOUS) ×9 IMPLANT
SPONGE SURGIFOAM ABS GEL SZ50 (HEMOSTASIS) ×3 IMPLANT
STRIP CLOSURE SKIN 1/2X4 (GAUZE/BANDAGES/DRESSINGS) IMPLANT
SUT VIC AB 0 CT1 18XCR BRD8 (SUTURE) ×1 IMPLANT
SUT VIC AB 0 CT1 8-18 (SUTURE) ×3
SUT VIC AB 2-0 CP2 18 (SUTURE) ×3 IMPLANT
SUT VIC AB 3-0 SH 8-18 (SUTURE) ×3 IMPLANT
SWAB COLLECTION DEVICE MRSA (MISCELLANEOUS) ×3 IMPLANT
TOWEL OR 17X24 6PK STRL BLUE (TOWEL DISPOSABLE) ×3 IMPLANT
TOWEL OR 17X26 10 PK STRL BLUE (TOWEL DISPOSABLE) ×3 IMPLANT
TRAY FOLEY W/METER SILVER 14FR (SET/KITS/TRAYS/PACK) ×2 IMPLANT
TUBE ANAEROBIC SPECIMEN COL (MISCELLANEOUS) ×3 IMPLANT
WATER STERILE IRR 1000ML POUR (IV SOLUTION) ×3 IMPLANT

## 2015-11-02 NOTE — Anesthesia Postprocedure Evaluation (Signed)
Anesthesia Post Note  Patient: [name]  Procedure(s) Performed: Procedure(s) (LRB): Irrigation and Debridement  LUMBAR WOUND  (N/A)  Patient location during evaluation: PACU Anesthesia Type: General Level of consciousness: awake and alert Pain management: pain level controlled Vital Signs Assessment: post-procedure vital signs reviewed and stable Respiratory status: spontaneous breathing, nonlabored ventilation, respiratory function stable and patient connected to nasal cannula oxygen Cardiovascular status: blood pressure returned to baseline and stable Postop Assessment: no signs of nausea or vomiting Anesthetic complications: no    Last Vitals:  Filed Vitals:   11/02/15 1543 11/02/15 1600  BP: 106/64 110/60  Pulse: 87 85  Temp:    Resp: 17 16    Last Pain:  Filed Vitals:   11/02/15 1608  PainSc: 0-No pain                 Lillah Standre,W. EDMOND

## 2015-11-02 NOTE — Progress Notes (Signed)
2 more run of IVP Potassium left. 2nd dose at a slow rate at this time when pt arrived to unit d/t c/o burning. Oncoming RN aware of 2 more runs of medication left to be administer. ICS given and encourage.   Ave Filter, RN

## 2015-11-02 NOTE — Anesthesia Procedure Notes (Signed)
Procedure Name: Intubation Date/Time: 11/02/2015 2:38 PM Performed by: Merdis Delay Pre-anesthesia Checklist: Patient identified, Timeout performed, Emergency Drugs available, Suction available and Patient being monitored Patient Re-evaluated:Patient Re-evaluated prior to inductionOxygen Delivery Method: Circle system utilized Preoxygenation: Pre-oxygenation with 100% oxygen Intubation Type: IV induction Ventilation: Mask ventilation without difficulty Laryngoscope Size: Mac and 3 Grade View: Grade I Tube type: Oral Tube size: 7.0 mm Number of attempts: 1 Airway Equipment and Method: Stylet Placement Confirmation: ETT inserted through vocal cords under direct vision,  breath sounds checked- equal and bilateral,  positive ETCO2 and CO2 detector Secured at: 21 cm Tube secured with: Tape Dental Injury: Teeth and Oropharynx as per pre-operative assessment

## 2015-11-02 NOTE — Anesthesia Preprocedure Evaluation (Addendum)
Anesthesia Evaluation  Patient identified by MRN, date of birth, ID band Patient awake    Reviewed: Allergy & Precautions, H&P , NPO status , Patient's Chart, lab work & pertinent test results, reviewed documented beta blocker date and time   Airway Mallampati: III  TM Distance: >3 FB Neck ROM: Full    Dental no notable dental hx. (+) Teeth Intact, Dental Advisory Given   Pulmonary neg pulmonary ROS,    Pulmonary exam normal breath sounds clear to auscultation       Cardiovascular hypertension, Pt. on medications and Pt. on home beta blockers +CHF   Rhythm:Regular Rate:Normal     Neuro/Psych Anxiety negative neurological ROS  negative psych ROS   GI/Hepatic Neg liver ROS, GERD  Medicated and Controlled,  Endo/Other  negative endocrine ROSMorbid obesity  Renal/GU Renal disease  negative genitourinary   Musculoskeletal  (+) Arthritis , Osteoarthritis,    Abdominal   Peds  Hematology negative hematology ROS (+)   Anesthesia Other Findings   Reproductive/Obstetrics negative OB ROS                            Anesthesia Physical Anesthesia Plan  ASA: III  Anesthesia Plan: General   Post-op Pain Management:    Induction: Intravenous  Airway Management Planned: Oral ETT  Additional Equipment:   Intra-op Plan:   Post-operative Plan: Extubation in OR  Informed Consent: I have reviewed the patients History and Physical, chart, labs and discussed the procedure including the risks, benefits and alternatives for the proposed anesthesia with the patient or authorized representative who has indicated his/her understanding and acceptance.   Dental advisory given  Plan Discussed with: CRNA  Anesthesia Plan Comments:         Anesthesia Quick Evaluation

## 2015-11-02 NOTE — Progress Notes (Signed)
Patient arrived from PACU alert at this time. Dressing noted wet and fell off. New honeycomb dressing place. PACU RN at bedside. Safety precautions and orders reviewed with patient/family. Diet placed at this time. Patient denied any distress. Will continue to monitor.  Ave Filter, RN
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
## 2015-11-02 NOTE — OR Nursing (Signed)
Patient allergic to betadine chlorhedadine used for prep and scrub care used foley insertion

## 2015-11-02 NOTE — ED Notes (Signed)
Pt brought by [facility] for post op pain.  Pt reports that her home health nurse came to check on her today after having back surgery on 3/8.  Home health nurse sent her here to be evaluated by Ronnald Ramp MD for increased pain, possible infection
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
## 2015-11-02 NOTE — Progress Notes (Signed)
Patient ID: [name], female   DOB: 1951/06/23, 65 y.o.   MRN: [id number] Less pressure in lumbar area than preop, no weakness

## 2015-11-02 NOTE — Transfer of Care (Signed)
Immediate Anesthesia Transfer of Care Note  Patient: [name]  Procedure(s) Performed: Procedure(s): Irrigation and Debridement  LUMBAR WOUND  (N/A)  Patient Location: PACU  Anesthesia Type:General  Level of Consciousness: awake, alert  and oriented  Airway & Oxygen Therapy: Patient Spontanous Breathing and Patient connected to face mask oxygen  Post-op Assessment: Report given to RN and Post -op Vital signs reviewed and stable  Post vital signs: Reviewed and stable  Last Vitals:  Filed Vitals:   11/02/15 1313 11/02/15 1515  BP: 114/72   Pulse: 96   Temp: 37.7 C 36.5 C  Resp: 18     Complications: No apparent anesthesia complications

## 2015-11-02 NOTE — ED Notes (Signed)
Dr. Ronnald Ramp examined pt in hallway of pod D-- pt states her home health nurse called the doctor because her back incision was infected and she needed to come here.

## 2015-11-03 ENCOUNTER — Encounter (HOSPITAL_COMMUNITY): Payer: Self-pay | Admitting: Neurosurgery

## 2015-11-03 DIAGNOSIS — Y838 Other surgical procedures as the cause of abnormal reaction of the patient, or of later complication, without mention of misadventure at the time of the procedure: Secondary | ICD-10-CM

## 2015-11-03 DIAGNOSIS — T814XXD Infection following a procedure, subsequent encounter: Secondary | ICD-10-CM

## 2015-11-03 DIAGNOSIS — Z981 Arthrodesis status: Secondary | ICD-10-CM

## 2015-11-03 DIAGNOSIS — S31000D Unspecified open wound of lower back and pelvis without penetration into retroperitoneum, subsequent encounter: Secondary | ICD-10-CM

## 2015-11-03 DIAGNOSIS — S34104D Unspecified injury to L4 level of lumbar spinal cord, subsequent encounter: Secondary | ICD-10-CM

## 2015-11-03 DIAGNOSIS — S34105D Unspecified injury to L5 level of lumbar spinal cord, subsequent encounter: Secondary | ICD-10-CM

## 2015-11-03 DIAGNOSIS — B9689 Other specified bacterial agents as the cause of diseases classified elsewhere: Secondary | ICD-10-CM

## 2015-11-03 LAB — GLUCOSE, CAPILLARY
GLUCOSE-CAPILLARY: 123 mg/dL — AB (ref 65–99)
GLUCOSE-CAPILLARY: 108 mg/dL — AB (ref 65–99)
GLUCOSE-CAPILLARY: 114 mg/dL — AB (ref 65–99)

## 2015-11-03 LAB — BASIC METABOLIC PANEL
ANION GAP: 11 (ref 5–15)
BUN: 14 mg/dL (ref 6–20)
CHLORIDE: 99 mmol/L — AB (ref 101–111)
CO2: 28 mmol/L (ref 22–32)
CREATININE: 1.34 mg/dL — AB (ref 0.44–1.00)
Calcium: 8.2 mg/dL — ABNORMAL LOW (ref 8.9–10.3)
GFR calc non Af Amer: 40 mL/min — ABNORMAL LOW (ref >60)
GFR, EST AFRICAN AMERICAN: 47 mL/min — AB (ref >60)
Glucose, Bld: 127 mg/dL — ABNORMAL HIGH (ref 65–99)
POTASSIUM: 3.7 mmol/L (ref 3.5–5.1)
SODIUM: 138 mmol/L (ref 135–145)

## 2015-11-03 MED ORDER — DEXTROSE 5 % IV SOLN
2.0000 g | INTRAVENOUS | Status: DC
  Administered 2015-11-03 – 2015-11-09 (×7): 2 g via INTRAVENOUS
  Filled 2015-11-03 (×7): qty 2

## 2015-11-03 NOTE — Progress Notes (Signed)
Physical medicine rehabilitation consult requested chart reviewed. Patient with incision and drainage of lumbar wound infection after recent thoracic lumbar laminectomy. She is currently minimal assist ambulating in the room from bed to chair. Physical occupational therapy evaluations are pending but they feel patient will do very well. Most likely patient to discharge to home with home health therapies and will not need inpatient rehabilitation services. Hold on formal rehabilitation consult at this time and continue to follow

## 2015-11-03 NOTE — Progress Notes (Signed)
Patient ID: [name], female   DOB: November 21, 1950, 65 y.o.   MRN: [id number] C/o incisional pain. No weakness. Needs to get out of bed. Pt to see

## 2015-11-03 NOTE — Clinical Social Work Note (Signed)
CSW received referral for SNF.  Case discussed with case manager, and plan is to discharge home.  CSW to sign off please re-consult if social work needs arise.  Elisa Kutner R. Gesenia Bantz, MSW, LCSWA [phone]  

## 2015-11-03 NOTE — Evaluation (Addendum)
Physical Therapy Evaluation Patient Details Name: [name] MRN: [id number] DOB: Jun 01, 1951 Today's Date: 11/03/2015   History of Present Illness  Pt is a 65 y.o. female s/p Irrigation and Debridement LUMBAR WOUNDon 11/02/2015. S/p T10-12, L2-3 laminectomies/decompression, L4-5 fusion on 10/25/2015. PMHx: obesity, R TSA, bil TKA, CHF.   Clinical Impression  Pt was able to ambulate into the hallway with RW, but moaning in pain while walking.  Min guard assist overall.  Pt will likely progress well enough to return home with family's assist and home therapy f/u at discharge.   PT to follow acutely for deficits listed below.       Follow Up Recommendations Home health PT;Supervision for mobility/OOB    Equipment Recommendations  None recommended by PT    Recommendations for Other Services   NA    Precautions / Restrictions Precautions Precautions: Back Precaution Booklet Issued: No Precaution Comments: Pt able to recall 3/3 precautions and required only one cue to avoid twisting at sink Required Braces or Orthoses:  ("No brace needed" order) Restrictions Weight Bearing Restrictions: No      Mobility  Bed Mobility       General bed mobility comments: Pt OOB in recliner chair when PT entered the room  Transfers Overall transfer level: Needs assistance Equipment used: Rolling walker (2 wheeled) Transfers: Sit to/from Stand Sit to Stand: Min guard         General transfer comment: Min guard assist for safety during transitions. Verbal cues for safe hand placement  Ambulation/Gait Ambulation/Gait assistance: Min guard Ambulation Distance (Feet): 60 Feet Assistive device: Rolling walker (2 wheeled) Gait Pattern/deviations: Step-through pattern;Shuffle;Trunk flexed Gait velocity: decreased Gait velocity interpretation: Below normal speed for age/gender General Gait Details: Pt's gait distance limited by pain into her buttocks bil and incisional pain.  Moaning  throughout.       Balance Overall balance assessment: Needs assistance Sitting-balance support: Feet supported;No upper extremity supported Sitting balance-Leahy Scale: Good     Standing balance support: Bilateral upper extremity supported Standing balance-Leahy Scale: Poor                               Pertinent Vitals/Pain Pain Assessment: 0-10 Pain Score: 5  Pain Location: back and bil buttocks Pain Descriptors / Indicators: Aching;Burning Pain Intervention(s): Limited activity within patient's tolerance;Monitored during session;Repositioned;RN gave pain meds during session    [place] expects to be discharged to:: Private residence Living Arrangements: Alone Available Help at Discharge: Family;Personal care attendant;Available 24 hours/day (aide 7 days/week x 3 hours) Type of Home: Apartment Home Access: Level entry     Home Layout: One level Home Equipment: Walker - 2 wheels;Bedside commode;Grab bars - tub/shower;Grab bars - toilet;Shower seat;Adaptive equipment      Prior Function Level of Independence: Needs assistance   Gait / Transfers Assistance Needed: off balance & holding wall or someone; assist with steps  ADL's / Homemaking Assistance Needed: aid assists with bathing, dressing, housework           Extremity/Trunk Assessment   Upper Extremity Assessment: Defer to OT evaluation           Lower Extremity Assessment: Generalized weakness      Cervical / Trunk Assessment: Other exceptions  Communication   Communication: No difficulties  Cognition Arousal/Alertness: Awake/alert Behavior During Therapy: WFL for tasks assessed/performed Overall Cognitive Status: Within Functional Limits for tasks assessed  Assessment/Plan    PT Assessment Patient needs continued PT services  PT Diagnosis Difficulty walking;Abnormality of gait;Generalized weakness;Acute pain   PT Problem  List Decreased strength;Decreased activity tolerance;Decreased balance;Decreased mobility;Decreased knowledge of use of DME;Obesity;Pain  PT Treatment Interventions DME instruction;Functional mobility training;Therapeutic activities;Gait training;Therapeutic exercise;Balance training;Neuromuscular re-education;Patient/family education;Modalities   PT Goals (Current goals can be found in the Care Plan section) Acute Rehab PT Goals Patient Stated Goal: to decrease her pain PT Goal Formulation: With patient Time For Goal Achievement: 11/10/15 Potential to Achieve Goals: Good    Frequency Min 5X/week           End of Session   Activity Tolerance: Patient limited by pain;Patient limited by fatigue Patient left: in chair;with call bell/phone within reach;with nursing/sitter in room;with family/visitor present Nurse Communication: Mobility status         Time: 1100-1114 PT Time Calculation (min) (ACUTE ONLY): 14 min   Charges:   PT Evaluation $PT Eval Moderate Complexity: 1 Procedure          Kashish Yglesias B. Ziggy Reveles, PT, DPT [phone]   11/03/2015, 11:23 AM

## 2015-11-03 NOTE — Op Note (Signed)
[name], [name]              ACCOUNT NO.:  [id number]  MEDICAL RECORD NO.:  [id number]  LOCATION:  [id number]                        FACILITY:  [facility]  PHYSICIAN:  Leeroy Cha, M.D.   DATE OF BIRTH:  1950/11/21  DATE OF PROCEDURE:  11/02/2015 DATE OF DISCHARGE:                              OPERATIVE REPORT   PREOPERATIVE DIAGNOSES:  Lumbar wound infection.  Status post L4-5 fusion and T12-T11 thoracic laminectomy.  POSTOPERATIVE DIAGNOSES:  Lumbar wound infection.  Status post L4-5 fusion and T12-T11 thoracic laminectomy.  PROCEDURE:  Incision and drainage of the upper and lower wounds.  SURGEON:  Leeroy Cha, M.D.  CLINICAL HISTORY:  [name] is a lady who about 10 days ago had fusion at the level of L4-5 plus thoracic laminectomy by Dr. Ronnald Ramp.  She did well, but she is having some back pain associated with some drainage, for which she came to the emergency room.  She was seen by Dr. Ronnald Ramp and the decision was made to revise the lower bone.  Dr. Ronnald Ramp was going out of town, so I took over.  DESCRIPTION OF PROCEDURE:  The patient was taken to the OR, and after intubation, she was positioned in a prone manner.  We found immediately that in the upper wound, although there was some dehiscence, nevertheless, there was no drainage.  In the lower one, the situation was different.  After it was cleaned, we applied some drapes and we opened the wound in the midline.  Immediately, a sort of dark, blackish fluid came which might be a mix of blood.  Two specimens were taken and sent to the lab.  The area was irrigated.  We went all the way laterally.  We drained all the fluid. Having done that, the wound was dried using bipolar.  Then, it was closed with Prolene and 2-0 Vicryl.  The skin was left open to close by secondary intention.  A drain was left in the lumbar area.  I took a look at the upper thoracic wound.  Although the wound was opening in the skin, nevertheless there  was no evidence of infection.  The patient is going to go to PACU and then she is going to go to the floor.  We are going to call Infectious Disease to help [name] with her care.          ______________________________ Leeroy Cha, M.D.     EB/MEDQ  D:  11/02/2015  T:  11/02/2015  Job:  [id number]

## 2015-11-03 NOTE — Evaluation (Signed)
Occupational Therapy Evaluation Patient Details Name: [name] MRN: [id number] DOB: 1951-03-29 Today's Date: 11/03/2015    History of Present Illness Pt is a 65 y.o. female s/p Irrigation and Debridement LUMBAR WOUNDon 11/02/2015. S/p T10-12, L2-3 laminectomies/decompression, L4-5 fusion on 10/25/2015. PMHx: obesity, R TSA, bil TKA, CHF.    Clinical Impression   Pt currently overall min guard for safety with functional mobility. Requires min-max assist for ADLs at this time; however pt reports she has required assist from her home health aide for bathing and dressing PTA. Feels she is at baseline with ADLs currently. All education complete; pt with no further questions or concerns for OT at this time. Recommending HHOT for follow up in order to maximize independence and safety with ADLs and functional mobility upon returning home alone with intermittent assist. No further acute OT needs identified; signing off at this time. Please re-consult if needs change. Thank you for this referral.     Follow Up Recommendations  Home health OT;Supervision - Intermittent    Equipment Recommendations  None recommended by OT    Recommendations for Other Services PT consult     Precautions / Restrictions Precautions Precautions: Back Precaution Booklet Issued: No Precaution Comments: Pt able to recall 3/3 precautions and required only one cue to avoid twisting at sink Required Braces or Orthoses:  ("No brace needed" order) Restrictions Weight Bearing Restrictions: No      Mobility Bed Mobility Overal bed mobility: Needs Assistance Bed Mobility: Rolling;Sidelying to Sit Rolling: Min guard Sidelying to sit: Min assist       General bed mobility comments: Min assist at trunk to come into full sitting position. Heavy use of rails and VCs throughout for hand placement and technique.  Transfers Overall transfer level: Needs assistance Equipment used: Rolling walker (2  wheeled) Transfers: Sit to/from Stand Sit to Stand: Min guard         General transfer comment: Min guard for safety; no physical assist required. Good hand placement and technique. Sit to stand from EOB x 1, chair x 2.    Balance Overall balance assessment: Needs assistance Sitting-balance support: Feet supported;No upper extremity supported Sitting balance-Leahy Scale: Good     Standing balance support: No upper extremity supported;During functional activity Standing balance-Leahy Scale: Poor                              ADL Overall ADL's : Needs assistance/impaired Eating/Feeding: Set up;Sitting   Grooming: Set up;Sitting;Wash/dry face   Upper Body Bathing: Minimal assitance;Sitting   Lower Body Bathing: Maximal assistance;Sit to/from stand   Upper Body Dressing : Minimal assistance;Sitting Upper Body Dressing Details (indicate cue type and reason): to doff/don hospital gown. Lower Body Dressing: Maximal assistance;Sit to/from stand Lower Body Dressing Details (indicate cue type and reason): to doff/don socks Toilet Transfer: Min guard;Ambulation;BSC;RW (BSC over toilet) Toilet Transfer Details (indicate cue type and reason): Simulated by transfer from EOB to chair. Toileting- Clothing Manipulation and Hygiene: Total assistance;Sit to/from stand       Functional mobility during ADLs: Min guard;Rolling walker General ADL Comments: Pts son and daughter in law present for OT eval. Pt reports she feels like she will be able to manage at home with assist of aide now that pain has decreased. Pt with no further questions or concerns for OT regarding ADLs or functional mobility. Reviewed maintaining precautions during functional activities, log roll technique, and home safety.  Vision Vision Assessment?: No apparent visual deficits   Perception     Praxis      Pertinent Vitals/Pain Pain Assessment: 0-10 Pain Score: 3  Pain Location: back Pain  Descriptors / Indicators: Sore Pain Intervention(s): Limited activity within patient's tolerance;Monitored during session;Repositioned     Hand Dominance     Extremity/Trunk Assessment Upper Extremity Assessment Upper Extremity Assessment: Overall WFL for tasks assessed   Lower Extremity Assessment Lower Extremity Assessment: Defer to PT evaluation   Cervical / Trunk Assessment Cervical / Trunk Assessment: Other exceptions (obesity)   Communication Communication Communication: No difficulties   Cognition Arousal/Alertness: Awake/alert Behavior During Therapy: WFL for tasks assessed/performed Overall Cognitive Status: Within Functional Limits for tasks assessed                     General Comments       Exercises       Shoulder Instructions      Home Living Family/patient expects to be discharged to:: Private residence Living Arrangements: Alone Available Help at Discharge: Family;Personal care attendant;Available 24 hours/day (aide 7 days/week x 3 hours) Type of Home: Apartment Home Access: Level entry     Home Layout: One level     Bathroom Shower/Tub: Tub/shower unit;Curtain Shower/tub characteristics: Architectural technologist: Handicapped height Bathroom Accessibility: Yes How Accessible: Accessible via walker Home Equipment: [place] - 2 wheels;Bedside commode;Grab bars - tub/shower;Grab bars - toilet;Shower seat;Adaptive equipment Adaptive Equipment: Reacher        Prior Functioning/Environment Level of Independence: Needs assistance  Gait / Transfers Assistance Needed: off balance & holding wall or someone; assist with steps ADL's / Homemaking Assistance Needed: aid assists with bathing, dressing, housework        OT Diagnosis: Generalized weakness;Acute pain   OT Problem List:     OT Treatment/Interventions:      OT Goals(Current goals can be found in the care plan section) Acute Rehab OT Goals OT Goal Formulation: All assessment and  education complete, DC therapy  OT Frequency:     Barriers to D/C:            Co-evaluation              End of Session Equipment Utilized During Treatment: Gait belt;Rolling walker Nurse Communication: Mobility status  Activity Tolerance: Patient tolerated treatment well Patient left: in chair;with call bell/phone within reach;with chair alarm set;with family/visitor present   Time: [id number] OT Time Calculation (min): 27 min Charges:  OT General Charges $OT Visit: 1 Procedure OT Evaluation $OT Eval Moderate Complexity: 1 Procedure OT Treatments $Self Care/Home Management : 8-22 mins G-Codes:     Binnie Kand M.S., OTR/L Pager: [phone]  11/03/2015, 10:57 AM

## 2015-11-03 NOTE — Consult Note (Signed)
[facility] for Infectious Disease       Reason for Consult: post op infection     Referring Physician: Dr. Joya Salm  Active Problems:   Wound infection after surgery   . cefTRIAXone (ROCEPHIN)  IV  2 g Intravenous Q24H  . citalopram  40 mg Oral Daily  . [START ON 11/09/2015] cloNIDine  0.3 mg Transdermal Q Thu  . furosemide  80 mg Oral Daily  . insulin aspart  0-15 Units Subcutaneous TID WC  . insulin aspart  0-5 Units Subcutaneous QHS  . metoprolol tartrate  12.5 mg Oral BID  . pantoprazole  40 mg Oral Daily  . potassium chloride SA  20 mEq Oral BID  . senna  1 tablet Oral BID  . simvastatin  20 mg Oral QPM  . sodium chloride flush  3 mL Intravenous Q12H    Recommendations: Ceftriaxone pending ID of organism Likely will be able to [name] oral therapy at discharge if sensitive  Routine HIV and hepatitis C screening  Assessment: She has a post op wound infection of the lower wound s/p L4-5 fusion and T10-11,T11-12 laminectomy, L2-3 laminectomy, facetectomy, foraminotomy and sublaminar decompression done 10/25/15.  Fluid was noted superficially, no mention of deep fluid below the fascia.  Culture growing GNR.   Antibiotics: Ceftriaxone started  HPI: [name] is a 64 y.o. female with morbid obesity, s/p surgery 3/8 for back pain, stenosis who had significant pain and noted drainage from the area.  Taken back to OR yesterday and noted brownish fluid and growth of GNR.  Pain much better now.  Was having fever and chills.  She was seen by [name] in July 2016 with concern for discitis in lumbar area that was noted on xray but MRI done did not show any significant concerns for discitis or osteomyelitis.  CRP then was also minimally elevated and ESR wnl.  She then saw neurosurgery and had above procedure and now with superficial infection.    Review of Systems:  Constitutional: negative for fevers and chills Gastrointestinal: negative for diarrhea Integument/breast: negative  for rash All other systems reviewed and are negative   Past Medical History  Diagnosis Date  . CHF (congestive heart failure) ([place])     [name].......LAST OFFICE VISIT WAS A FEW AGO  . 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 Hypercholesteremia   . Sleep difficulties     pt. states she had sleep eval > 10 yrs. ago in [place], no apnea found  . Hypertension     dr [name]  . CHF (congestive heart failure) ([place]) 03/06/2015  . IBS (irritable bowel syndrome) 03/06/2015  . Spinal cord tumor ([place]) 03/06/2015    pt. denies  . 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 Zoster 03/06/2015  . Chronic bronchitis ([place])     "just about q yr; use nebulizer prn" (03/17/2015)  . Arthritis     "just about qwhere" (03/17/2015)  . Discitis of lumbosacral region 03/06/2015  . Chronic lower back pain   . Anxiety   . Cyst of right kidney   . GERD (gastroesophageal reflux disease)     takes nexium  . Heart palpitations     takes Metoprolol  . History of pneumonia   . Complication of anesthesia     @ [place].....COULDN'T GET HER AWAKE....FOR  HERNIA SURGERY... PLACED ON VENTILATOR; woke up during cyst excision OR on my back"; 07/06/09 V[facility]: re-intubated in  PACU due to hypoventilation, extubated POD#1    Social History  Substance Use Topics  . Smoking status: Never Smoker   . Smokeless tobacco: Never Used  . Alcohol Use: Yes     Comment: "stopped drinking in the 1980's; never drank much"; very rarely    Family History  Problem Relation Age of Onset  . Anesthesia problems Neg Hx   . Heart attack Neg Hx   . Stroke Neg Hx   . Hypertension Mother   . Hypertension Father   . Hypertension Brother   . Hypertension Daughter   . Hypertension Maternal Grandmother   . Hypertension Maternal Grandfather   . Hypertension Paternal Grandmother   . Hypertension Paternal Grandfather     Allergies  Allergen Reactions  . Aspirin Other (See Comments)    Stomach bleeding  .  Ibuprofen Other (See Comments)    Stomach bleeding  . Mushroom Extract Complex Hives and Itching  . Shellfish Allergy Hives and Itching  . Sulfa Antibiotics Hives and Itching  . Betadine [Povidone Iodine] Itching  . Coconut Flavor Itching  . Codeine Itching  . Eggs Or Egg-Derived Products Nausea And Vomiting  . Iodine Itching  . Ivp Dye [Iodinated Diagnostic Agents] Hives    Takes Benadryl 60m PO before receiving iodinated contrast     Physical Exam: Constitutional: in no apparent distress and alert, though a bit anxious Filed Vitals:   11/03/15 0147 11/03/15 0454  BP: 93/59 109/75  Pulse: 57 67  Temp: 97.9 F (36.6 C) 97.9 F (36.6 C)  Resp: 16 16   EYES: anicteric ENMT: no thrush Cardiovascular: Cor RRR and No murmurs Respiratory: CTA B, anterior exam; normal respiratory effort GI: Bowel sounds are normal Musculoskeletal: no pedal edema noted Skin: negatives: no rash Hematologic: no cervical lad  Lab Results  Component Value Date   WBC 6.9 11/02/2015   HGB 10.0* 11/02/2015   HCT 30.4* 11/02/2015   MCV 88.9 11/02/2015   PLT 241 11/02/2015    Lab Results  Component Value Date   CREATININE 1.34* 11/03/2015   BUN 14 11/03/2015   NA 138 11/03/2015   K 3.7 11/03/2015   CL 99* 11/03/2015   CO2 28 11/03/2015    Lab Results  Component Value Date   ALT 15 03/18/2015   AST 18 03/18/2015   ALKPHOS 67 03/18/2015     Microbiology: Recent Results (from the past 240 hour(s))  Anaerobic culture     Status: None (Preliminary result)   Collection Time: 11/02/15  3:23 PM  Result Value Ref Range Status   Specimen Description WOUND  Final   Special Requests LUMBAR  Final   Gram Stain   Final    NO WBC SEEN NO SQUAMOUS EPITHELIAL CELLS SEEN FEW GRAM NEGATIVE RODS [facility]formed [facility]   Culture PENDING  Incomplete   Report Status PENDING  Incomplete  Wound culture     Status: None (Preliminary result)   Collection Time: 11/02/15  3:23 PM  Result  Value Ref Range Status   Specimen Description WOUND  Final   Special Requests LUMBAR  Final   Gram Stain   Final    NO WBC SEEN NO SQUAMOUS EPITHELIAL CELLS SEEN FEW GRAM NEGATIVE RODS Performed at [facility]   Culture PENDING  Incomplete   Report Status PENDING  Incomplete    COMER, RHerbie [place] MDuchess Landingfor Infectious Disease [facility] [email or web address] [phone]  [phone] 11/03/2015, 10:12 AM

## 2015-11-03 NOTE — Care Management Note (Signed)
Case Management Note  Patient Details  Name: ADAMINA LAVELLE MRN: [id number] Date of Birth: [birth year]  Subjective/Objective:                    Action/Plan: Patient was admitted for a post-operative wound infection. Underwent and I&D.  Patient was recently discharged home with [facility] therapy with [facility]. CM will continue to follow for discharge needs pending PT/OT evals and physician orders.  Expected Discharge Date:                  Expected Discharge Plan:     In-House Referral:     Discharge planning Services     Post Acute Care Choice:    Choice offered to:     DME Arranged:    DME Agency:     HH Arranged:    HH Agency:     Status of Service:  In process, will continue to follow  Medicare Important Message Given:    Date Medicare IM Given:    Medicare IM give by:    Date Additional Medicare IM Given:    Additional Medicare Important Message give by:     If discussed at [place] of Stay Meetings, dates discussed:    Additional Comments:  Rolm Baptise, RN 11/03/2015, 10:52 AM [phone]

## 2015-11-04 DIAGNOSIS — Z9889 Other specified postprocedural states: Secondary | ICD-10-CM

## 2015-11-04 DIAGNOSIS — M545 Low back pain: Secondary | ICD-10-CM

## 2015-11-04 LAB — GLUCOSE, CAPILLARY
GLUCOSE-CAPILLARY: 102 mg/dL — AB (ref 65–99)
GLUCOSE-CAPILLARY: 94 mg/dL (ref 65–99)
Glucose-Capillary: 169 mg/dL — ABNORMAL HIGH (ref 65–99)

## 2015-11-04 LAB — C DIFFICILE QUICK SCREEN W PCR REFLEX
C DIFFICLE (CDIFF) ANTIGEN: NEGATIVE
C Diff interpretation: NEGATIVE
C Diff toxin: NEGATIVE

## 2015-11-04 NOTE — Progress Notes (Signed)
No acute events MAXIMUM TEMPERATURE 102.6 early yesterday Vital signs stable Awake, alert, oriented Moves legs well Incision looks good Stable DC Hemovac drain Discharge pending final culture results and ID recommendations

## 2015-11-04 NOTE — Progress Notes (Signed)
[facility] for Infectious Disease    Date of Admission:  11/02/2015   Total days of antibiotics 3        Day 3 proteus           ID: [name] is a 65 y.o. female  s/p Irrigation and Debridement LUMBAR WOUNDon 11/02/2015. S/p T10-12, L2-3 laminectomies/decompression, L4-5 fusion on 10/25/2015.  Cultures growing proteus Active Problems:   Wound infection after surgery    Subjective: Having diarrhea (numerous BM today. Had fever up to 102.6 yesterday, now at 99.3. Had I x D on 3/16 with wound cx showing proteus. seh reports having abdominal cramping and pain with diarrhea. She has significant back pain.  Medications:  . cefTRIAXone (ROCEPHIN)  IV  2 g Intravenous Q24H  . citalopram  40 mg Oral Daily  . [START ON 11/09/2015] cloNIDine  0.3 mg Transdermal Q Thu  . furosemide  80 mg Oral Daily  . metoprolol tartrate  12.5 mg Oral BID  . pantoprazole  40 mg Oral Daily  . potassium chloride SA  20 mEq Oral BID  . senna  1 tablet Oral BID  . simvastatin  20 mg Oral QPM  . sodium chloride flush  3 mL Intravenous Q12H    Objective: Vital signs in last 24 hours: Temp:  [99.1 F (37.3 C)-102.6 F (39.2 C)] 99.1 F (37.3 C) (03/18 1013) Pulse Rate:  [79-89] 81 (03/18 1013) Resp:  [18-20] 19 (03/18 1013) BP: (98-133)/(44-69) 112/56 mmHg (03/18 1013) SpO2:  [91 %-100 %] 100 % (03/18 1013) Physical Exam  Constitutional:  oriented to person, place, and time. appears well-developed and well-nourished. No distress.  HENT: [place]/AT, PERRLA, no scleral icterus Mouth/Throat: Oropharynx is clear and moist. No oropharyngeal exudate.  Cardiovascular: Normal rate, regular rhythm and normal heart sounds. Exam reveals no gallop and no friction rub.  No murmur heard.  Pulmonary/Chest: Effort normal and breath sounds normal. No respiratory distress.  has no wheezes.  Neck = supple, no nuchal rigidity Abdominal: Soft. Bowel sounds are normal.  exhibits no distension. There is no tenderness.   Lymphadenopathy: no cervical adenopathy. No axillary adenopathy Neurological: alert and oriented to person, place, and time.  Skin: Skin is warm and dry. No rash noted. No erythema.  Psychiatric: a normal mood and affect.  behavior is normal.    Lab Results  Recent Labs  11/02/15 1324 11/03/15 0543  WBC 6.9  --   HGB 10.0*  --   HCT 30.4*  --   NA 136 138  K 2.6* 3.7  CL 91* 99*  CO2 31 28  BUN 9 14  CREATININE 1.21* 1.34*    Microbiology:  Studies/Results: No results found.   Assessment/Plan: Proteus superficial wound SSI from L4-L5 fusion = will recommend to continue with ceftriaxone for now. Awaiting for sensitivities. Fever curve trending down. Will check sed rate  Diarrhea = likely loose stools since she is on laxative, per [facility] she had 3 doses over the last 2 days, none today. Only has had watery diarrhea today. In addition to diarrhea, she has abdominal cramping. Has hx of abtx exposure thus concern that she is at risk for cdifficile, despite laxative. The patient states that watery stools is different than her IBS hx.  We will discontinue laxatives. C.difficile testing has been sent by primary team.   Back pain = defer to primary team for management. She appears poorly controlled presently  [facility], [facility] for Infectious Diseases Cell: [phone]  Pager: [phone]  11/04/2015, 12:40 PM

## 2015-11-04 NOTE — Progress Notes (Signed)
Physical Therapy Treatment Patient Details Name: [name] MRN: [id number] DOB: 08-07-1951 Today's Date: 11/04/2015    History of Present Illness Pt is a 65 y.o. female s/p Irrigation and Debridement LUMBAR WOUNDon 11/02/2015. S/p T10-12, L2-3 laminectomies/decompression, L4-5 fusion on 10/25/2015. PMHx: obesity, R TSA, bil TKA, CHF.     PT Comments    Mobility limited by diarrhea this session.   Follow Up Recommendations  Home health PT;Supervision for mobility/OOB     Equipment Recommendations  None recommended by PT    Recommendations for Other Services       Precautions / Restrictions Precautions Precautions: Back Precaution Comments: Pt able to recall 3/3 back precautions. Spinal Brace:  (MD order for 'no brace needed')    Mobility  Bed Mobility               General bed mobility comments: Pt OOB in recliner chair when PT entered the room.  Transfers   Equipment used: Rolling walker (2 wheeled)   Sit to Stand: Min guard Stand pivot transfers: Min guard       General transfer comment: Verbal cues for sequencing.  Ambulation/Gait Ambulation/Gait assistance: Min guard Ambulation Distance (Feet): 125 Feet Assistive device: Rolling walker (2 wheeled) Gait Pattern/deviations: Step-through pattern;Decreased stride length;Wide base of support Gait velocity: decreased Gait velocity interpretation: Below normal speed for age/gender General Gait Details: Pt moaning throughout mobility.   Stairs            Wheelchair Mobility    Modified Rankin (Stroke Patients Only)       Balance                                    Cognition Arousal/Alertness: Awake/alert Behavior During Therapy: WFL for tasks assessed/performed Overall Cognitive Status: Within Functional Limits for tasks assessed                      Exercises      General Comments        Pertinent Vitals/Pain Pain Assessment: 0-10 Pain Score: 5  Pain  Location: back Pain Descriptors / Indicators: Operative site guarding Pain Intervention(s): Monitored during session    Home Living                      Prior Function            PT Goals (current goals can now be found in the care plan section) Acute Rehab PT Goals Patient Stated Goal: to decrease her pain PT Goal Formulation: With patient Time For Goal Achievement: 11/10/15 Potential to Achieve Goals: Good Progress towards PT goals: Progressing toward goals    Frequency  Min 5X/week    PT Plan Current plan remains appropriate    Co-evaluation             End of Session Equipment Utilized During Treatment: Gait belt Activity Tolerance: Treatment limited secondary to medical complications (Comment) (diarrhea) Patient left: in chair;with call bell/phone within reach     Time: 0946-1016 PT Time Calculation (min) (ACUTE ONLY): 30 min  Charges:  $Gait Training: 23-37 mins                    G Codes:      [name] 11/04/2015, 10:20 AM

## 2015-11-04 NOTE — Progress Notes (Signed)
Pt had a temp of 101 at 2234, tab tylenol 650mg  given at 2244 and pt encouraged to use the incentive spirometry, Dr Arnoldo Morale (on call) paged and notified, called back and said Dr Joya Salm will review pt in the morning, pt reassured, will continue to monitor. [place]-Asidi, Kelan Pritt Efe

## 2015-11-05 DIAGNOSIS — N179 Acute kidney failure, unspecified: Secondary | ICD-10-CM

## 2015-11-05 LAB — CBC WITH DIFFERENTIAL/PLATELET
BASOS ABS: 0 10*3/uL (ref 0.0–0.1)
BASOS PCT: 0 %
EOS ABS: 0.1 10*3/uL (ref 0.0–0.7)
EOS PCT: 1 %
HCT: 31 % — ABNORMAL LOW (ref 36.0–46.0)
Hemoglobin: 9.7 g/dL — ABNORMAL LOW (ref 12.0–15.0)
Lymphocytes Relative: 17 %
Lymphs Abs: 1.1 10*3/uL (ref 0.7–4.0)
MCH: 27.9 pg (ref 26.0–34.0)
MCHC: 31.3 g/dL (ref 30.0–36.0)
MCV: 89.1 fL (ref 78.0–100.0)
MONO ABS: 1.6 10*3/uL — AB (ref 0.1–1.0)
MONOS PCT: 24 %
NEUTROS ABS: 4 10*3/uL (ref 1.7–7.7)
Neutrophils Relative %: 58 %
PLATELETS: 265 10*3/uL (ref 150–400)
RBC: 3.48 MIL/uL — ABNORMAL LOW (ref 3.87–5.11)
RDW: 12.6 % (ref 11.5–15.5)
WBC: 6.8 10*3/uL (ref 4.0–10.5)

## 2015-11-05 LAB — COMPREHENSIVE METABOLIC PANEL
ALBUMIN: 2.2 g/dL — AB (ref 3.5–5.0)
ALT: 11 U/L — ABNORMAL LOW (ref 14–54)
ANION GAP: 10 (ref 5–15)
AST: 26 U/L (ref 15–41)
Alkaline Phosphatase: 66 U/L (ref 38–126)
BILIRUBIN TOTAL: 0.2 mg/dL — AB (ref 0.3–1.2)
BUN: 9 mg/dL (ref 6–20)
CHLORIDE: 102 mmol/L (ref 101–111)
CO2: 27 mmol/L (ref 22–32)
Calcium: 7.8 mg/dL — ABNORMAL LOW (ref 8.9–10.3)
Creatinine, Ser: 1.04 mg/dL — ABNORMAL HIGH (ref 0.44–1.00)
GFR calc Af Amer: >60 mL/min (ref >60)
GFR calc non Af Amer: 55 mL/min — ABNORMAL LOW (ref >60)
GLUCOSE: 112 mg/dL — AB (ref 65–99)
POTASSIUM: 3.6 mmol/L (ref 3.5–5.1)
SODIUM: 139 mmol/L (ref 135–145)
TOTAL PROTEIN: 6.2 g/dL — AB (ref 6.5–8.1)

## 2015-11-05 LAB — GLUCOSE, CAPILLARY
Glucose-Capillary: 164 mg/dL — ABNORMAL HIGH (ref 65–99)
Glucose-Capillary: 94 mg/dL (ref 65–99)

## 2015-11-05 MED ORDER — SODIUM CHLORIDE 0.9% FLUSH: 10.0000 mL | INTRAVENOUS | Status: DC | PRN

## 2015-11-05 MED ORDER — VANCOMYCIN HCL 10 G IV SOLR
1250.0000 mg | INTRAVENOUS | Status: DC
  Administered 2015-11-05 – 2015-11-06 (×2): 1250 mg via INTRAVENOUS
  Filled 2015-11-05 (×2): qty 1250

## 2015-11-05 NOTE — Progress Notes (Signed)
Physical Therapy Treatment Patient Details Name: [name] MRN: [id number] DOB: 07/29/1951 Today's Date: 11/05/2015    History of Present Illness Pt is a 65 y.o. female s/p Irrigation and Debridement LUMBAR WOUNDon 11/02/2015. S/p T10-12, L2-3 laminectomies/decompression, L4-5 fusion on 10/25/2015. PMHx: obesity, R TSA, bil TKA, CHF.     PT Comments    Pt is making steady progress today toward mobility goals. Pt without any bowel issues today and was able to progress her gait distance. Continues to moan with all mobility, however was able to continue and reported no increase in her pain at the end of the session. Acute PT to continue during pt's hospital stay to progress pt toward independence with activity.    Follow Up Recommendations  Home health PT;Supervision for mobility/OOB     Equipment Recommendations  None recommended by PT    Recommendations for Other Services OT consult     Precautions / Restrictions Precautions Precautions: Back Precaution Comments: Pt able to recall 3/3 back precautions. Spinal Brace: Other (comment) (Md order for no brace) Restrictions Weight Bearing Restrictions: No    Mobility  Bed Mobility     Rolling: Supervision Sidelying to sit: Min guard       General bed mobility comments: with bed flat and no rails used. Pt independent with demo, no cues or assist needed. min guard for IV line management only  Transfers Overall transfer level: Needs assistance Equipment used: Rolling walker (2 wheeled) Transfers: Sit to/from Stand Sit to Stand: Min guard         General transfer comment: cues for hand placement and weight shifting ot assist with transfers  Ambulation/Gait Ambulation/Gait assistance: Min guard Ambulation Distance (Feet): 135 Feet Assistive device: Rolling walker (2 wheeled) Gait Pattern/deviations: Step-through pattern;Decreased stride length;Wide base of support Gait velocity: decreased Gait velocity  interpretation: Below normal speed for age/gender General Gait Details: Pt moaning throughout mobility. No balance loss or knee buckling with gait. Pt able to increase step/stride length with cues duirng last half of gait. 2 standing rest breaks to relieve UE pressure for excessive weight bearing on walker.        Cognition Arousal/Alertness: Awake/alert Behavior During Therapy: WFL for tasks assessed/performed Overall Cognitive Status: Within Functional Limits for tasks assessed          Pertinent Vitals/Pain Pain Assessment: No/denies pain Pain Score: 5  Pain Location: back, incision site Pain Descriptors / Indicators: Operative site guarding;Tender Pain Intervention(s): Limited activity within patient's tolerance;Monitored during session;Premedicated before session;Repositioned     PT Goals (current goals can now be found in the care plan section) Acute Rehab PT Goals Patient Stated Goal: to decrease her pain PT Goal Formulation: With patient Time For Goal Achievement: 11/10/15 Potential to Achieve Goals: Good Progress towards PT goals: Progressing toward goals    Frequency  Min 5X/week    PT Plan Current plan remains appropriate    End of Session Equipment Utilized During Treatment: Gait belt Activity Tolerance: Patient tolerated treatment well Patient left: in chair;with call bell/phone within reach;with chair alarm set     Time: [id number] PT Time Calculation (min) (ACUTE ONLY): 28 min  Charges:  $Gait Training: 8-22 mins $Therapeutic Activity: 8-22 mins           Willow Ora 11/05/2015, 10:28 AM  Willow Ora, PTA, CLT Acute Rehab Services [phone] 11/05/2015, 10:30 AM

## 2015-11-05 NOTE — Progress Notes (Signed)
Patient ID: [name], female   DOB: 10/19/1950, 65 y.o.   MRN: [id number] Subjective:  The patient is alert and pleasant. She is in no apparent distress. Her diarrhea has resolved.  Objective: Vital signs in last 24 hours: Temp:  [98.8 F (37.1 C)-99.5 F (37.5 C)] 99.3 F (37.4 C) (03/19 0600) Pulse Rate:  [72-88] 74 (03/19 0600) Resp:  [17-19] 18 (03/19 0600) BP: (112-136)/(53-73) 132/67 mmHg (03/19 0600) SpO2:  [97 %-100 %] 100 % (03/19 0600)  Intake/Output from previous day: 03/18 0701 - 03/19 0700 In: 240 [P.O.:240] Out: -  Intake/Output this shift:    Physical exam the patient is alert and pleasant. She is moving her lower extremities well.  The patient's wound is quite deep secondary to the patient's body habitus. He does have some thin grayish brown discharge on her dressing.  Lab Results:  Recent Labs  11/02/15 1324  WBC 6.9  HGB 10.0*  HCT 30.4*  PLT 241   BMET  Recent Labs  11/02/15 1324 11/03/15 0543  NA 136 138  K 2.6* 3.7  CL 91* 99*  CO2 31 28  GLUCOSE 176* 127*  BUN 9 14  CREATININE 1.21* 1.34*  CALCIUM 8.2* 8.2*    Studies/Results: No results found.  Assessment/Plan: Postop day #3/wound infection: The patient continues to have some drainage. So far the cultures have grown Proteus. With all the patient's diarrhea and the deep nature of her wound I'm concerned about fecal contamination. I'll add vancomycin. We will await the final culture and sensitivity and ask infectious disease to see the patient tomorrow to help guide [name] on antibiotics and length of treatment.  LOS: 3 days     Kore Madlock D 11/05/2015, 9:26 AM

## 2015-11-05 NOTE — Progress Notes (Signed)
Pharmacy Antibiotic Note  [name] is a 65 y.o. female admitted on 11/02/2015 with lumbar wound infection.  Pharmacy has been consulted for vancomycin dosing.  Plan: Vancomycin 1250 IV every 24 hours.  Goal trough 15-20 mcg/mL.  Height: 5\' 5"  (165.1 cm) Weight: 250 lb (113.399 kg) IBW/kg (Calculated) : 57  Temp (24hrs), Avg:99.2 F (37.3 C), Min:98.8 F (37.1 C), Max:99.5 F (37.5 C)   Recent Labs Lab 11/02/15 1324 11/03/15 0543  WBC 6.9  --   CREATININE 1.21* 1.34*    Estimated Creatinine Clearance: 51.9 mL/min (by C-G formula based on Cr of 1.34).    Allergies  Allergen Reactions  . Aspirin Other (See Comments)    Stomach bleeding  . Ibuprofen Other (See Comments)    Stomach bleeding  . Mushroom Extract Complex Hives and Itching  . Shellfish Allergy Hives and Itching  . Sulfa Antibiotics Hives and Itching  . Betadine [Povidone Iodine] Itching  . Coconut Flavor Itching  . Codeine Itching  . Eggs Or Egg-Derived Products Nausea And Vomiting  . Iodine Itching  . Ivp Dye [Iodinated Diagnostic Agents] Hives    Takes Benadryl 50mg  PO before receiving iodinated contrast     Antimicrobials this admission: Cefazolin 3/16>>3/17 Ceftriaxone 3/17>> Vancomycin 3/19>>  Dose adjustments this admission: N/A  Microbiology results: 3/16 Wound: Proteus 3/18 Cdif neg  Levester Fresh, PharmD, BCPS, [facility] Clinical Pharmacist Pager [phone] 11/05/2015 10:40 AM

## 2015-11-05 NOTE — Progress Notes (Signed)
Peripherally Inserted Central Catheter/Midline Placement  The IV Nurse has discussed with the patient and/or persons authorized to consent for the patient, the purpose of this procedure and the potential benefits and risks involved with this procedure.  The benefits include less needle sticks, lab draws from the catheter and patient may be discharged home with the catheter.  Risks include, but not limited to, infection, bleeding, blood clot (thrombus formation), and puncture of an artery; nerve damage and irregular heat beat.  Alternatives to this procedure were also discussed.  PICC/Midline Placement Documentation  PICC Single Lumen 11/05/15 PICC Right Cephalic 42 cm 0 cm (Active)  Indication for Insertion or Continuance of Line Poor Vasculature-patient has had multiple peripheral attempts or PIVs lasting less than 24 hours;Limited venous access - need for IV therapy >5 days (PICC only) 11/05/2015  7:08 PM  Exposed Catheter (cm) 0 cm 11/05/2015  7:08 PM  Site Assessment Clean;Dry;Intact 11/05/2015  7:08 PM  Line Status Flushed;Saline locked;Blood return noted 11/05/2015  7:08 PM  Dressing Type Transparent 11/05/2015  7:08 PM  Dressing Status Clean;Dry;Intact;Antimicrobial disc in place 11/05/2015  7:08 PM  Line Care Connections checked and tightened 11/05/2015  7:08 PM  Line Adjustment (NICU/IV Team Only) No 11/05/2015  7:08 PM  Dressing Intervention New dressing 11/05/2015  7:08 PM  Dressing Change Due 11/12/15 11/05/2015  7:08 PM       Rolena Infante 11/05/2015, 7:09 PM

## 2015-11-05 NOTE — Progress Notes (Signed)
[facility] for Infectious Disease    Date of Admission:  11/02/2015   Total days of antibiotics 4        Day 3 ceftriaxone        Day 1 vanco   ID: [name] is a 65 y.o. female  s/p Irrigation and Debridement LUMBAR WOUNDon 11/02/2015. S/p T10-12, L2-3 laminectomies/decompression, L4-5 fusion on 10/25/2015.  Cultures growing proteus Active Problems:   Wound infection after surgery    Subjective: Diarrhea improved since stopping laxative. cdiff testing was still sent which was negative. She reports back pain better, though still looks like she is in discomfort. She ambulated with PT today. Had I x D on 3/16 with wound cx showing proteus. Dr. Arnoldo Morale concern for fecal contamination to wound given dressing condition and numerous bouts of diarrhea yesterdya  Medications:  . cefTRIAXone (ROCEPHIN)  IV  2 g Intravenous Q24H  . citalopram  40 mg Oral Daily  . [START ON 11/09/2015] cloNIDine  0.3 mg Transdermal Q Thu  . furosemide  80 mg Oral Daily  . metoprolol tartrate  12.5 mg Oral BID  . pantoprazole  40 mg Oral Daily  . potassium chloride SA  20 mEq Oral BID  . senna  1 tablet Oral BID  . simvastatin  20 mg Oral QPM  . sodium chloride flush  3 mL Intravenous Q12H  . vancomycin  1,250 mg Intravenous Q24H    Objective: Vital signs in last 24 hours: Temp:  [97.7 F (36.5 C)-99.5 F (37.5 C)] 97.7 F (36.5 C) (03/19 1350) Pulse Rate:  [67-88] 67 (03/19 1350) Resp:  [17-18] 18 (03/19 1350) BP: (101-136)/(53-73) 101/58 mmHg (03/19 1350) SpO2:  [97 %-100 %] 100 % (03/19 1350) Physical Exam  Constitutional:  oriented to person, place, and time. appears well-developed and well-nourished. No distress.  HENT: [place]/AT, PERRLA, no scleral icterus Mouth/Throat: Oropharynx is clear and moist. No oropharyngeal exudate.  Cardiovascular: Normal rate, regular rhythm and normal heart sounds. Exam reveals no gallop and no friction rub.  No murmur heard.  Pulmonary/Chest: Effort  normal and breath sounds normal. No respiratory distress.  has no wheezes.  Neck = supple, no nuchal rigidity Abdominal: Soft. Bowel sounds are normal.  exhibits no distension. There is no tenderness.  Lymphadenopathy: no cervical adenopathy. No axillary adenopathy Neurological: alert and oriented to person, place, and time.  Back: dressing intact. No redness outside of dressing, not saturated Skin: Skin is warm and dry. No rash noted. No erythema.  Psychiatric: a normal mood and affect.  behavior is normal.    Lab Results  Recent Labs  11/03/15 0543 11/05/15 1405  WBC  --  6.8  HGB  --  9.7*  HCT  --  31.0*  NA 138 139  K 3.7 3.6  CL 99* 102  CO2 28 27  BUN 14 9  CREATININE 1.34* 1.04*     Microbiology: 3/16 wound cx proteus -sensitivities are pending Studies/Results: No results found.   Assessment/Plan: Proteus superficial wound SSI from L4-L5 fusion = will recommend to continue with ceftriaxone for now. Awaiting for sensitivities. Fever curve trending down. Will check sed rate  Diarrhea = discontinue laxatives. C.difficile testing is negative. Diarrhea resolving  Back pain = defer to primary team for management. She appears poorly controlled presently  aki = improved, continues to have cr function back to baseline  [facility], [facility] for Infectious Diseases Cell: [phone] Pager: [phone]  11/05/2015, 5:19 PM

## 2015-11-06 DIAGNOSIS — Z95828 Presence of other vascular implants and grafts: Secondary | ICD-10-CM

## 2015-11-06 DIAGNOSIS — B961 Klebsiella pneumoniae [K. pneumoniae] as the cause of diseases classified elsewhere: Secondary | ICD-10-CM

## 2015-11-06 DIAGNOSIS — B964 Proteus (mirabilis) (morganii) as the cause of diseases classified elsewhere: Secondary | ICD-10-CM

## 2015-11-06 DIAGNOSIS — R197 Diarrhea, unspecified: Secondary | ICD-10-CM

## 2015-11-06 LAB — CREATININE, SERUM
Creatinine, Ser: 0.92 mg/dL (ref 0.44–1.00)
GFR calc non Af Amer: >60 mL/min (ref >60)

## 2015-11-06 LAB — GLUCOSE, CAPILLARY
GLUCOSE-CAPILLARY: 147 mg/dL — AB (ref 65–99)
GLUCOSE-CAPILLARY: 108 mg/dL — AB (ref 65–99)
GLUCOSE-CAPILLARY: 108 mg/dL — AB (ref 65–99)

## 2015-11-06 LAB — WOUND CULTURE: GRAM STAIN: NONE SEEN

## 2015-11-06 LAB — SEDIMENTATION RATE: SED RATE: 109 mm/h — AB (ref 0–22)

## 2015-11-06 NOTE — Progress Notes (Signed)
Patient ID: [name], female   DOB: [birth year], 65 y.o.   MRN: [id number] Stable, less back pain. Seen by id. Drain out

## 2015-11-06 NOTE — Care Management Note (Signed)
Case Management Note  Patient Details  Name: [name] MRN: [id number] Date of Birth: [birth year]  Subjective/Objective:                    Action/Plan: Plan is for patient to discharge home with home antibiotics when medically stable. CM met with the patient and she is already active with [facility] and would like to continue using them. CM spoke with Pam with [facility] IV therapy and informed her of the referral. CM will continue to follow for further d/c needs.   Expected Discharge Date:                  Expected Discharge Plan:     In-House Referral:     Discharge planning Services     Post Acute Care Choice:    Choice offered to:     DME Arranged:    DME Agency:     HH Arranged:    HH Agency:     Status of Service:  In process, will continue to follow  Medicare Important Message Given:    Date Medicare IM Given:    Medicare IM give by:    Date Additional Medicare IM Given:    Additional Medicare Important Message give by:     If discussed at [place] of Stay Meetings, dates discussed:    Additional Comments:  Pollie Friar, RN 11/06/2015, 12:54 PM

## 2015-11-06 NOTE — Progress Notes (Signed)
[place] for Infectious Disease   Reason for visit: Follow up on post op wound infection  Interval History: grew 2 organisms, Proteus and Klebsiella. History of Klebsiella though I can't tell the source.  She has had previous surgeries. Still with pain.  Had picc line placed by primary team.    Physical Exam: Constitutional:  Filed Vitals:   11/06/15 0541 11/06/15 1029  BP: 145/65 108/45  Pulse: 75 73  Temp: 98.6 F (37 C) 98.8 F (37.1 C)  Resp: 20 20   patient appears in NAD Respiratory: Normal respiratory effort; CTA B Cardiovascular: RRR  Review of Systems: Constitutional: negative for fevers and chills Gastrointestinal: negative for diarrhea, but several loose stools after laxitive  Lab Results  Component Value Date   WBC 6.8 11/05/2015   HGB 9.7* 11/05/2015   HCT 31.0* 11/05/2015   MCV 89.1 11/05/2015   PLT 265 11/05/2015    Lab Results  Component Value Date   CREATININE 0.92 11/06/2015   BUN 9 11/05/2015   NA 139 11/05/2015   K 3.6 11/05/2015   CL 102 11/05/2015   CO2 27 11/05/2015    Lab Results  Component Value Date   ALT 11* 11/05/2015   AST 26 11/05/2015   ALKPHOS 66 11/05/2015     Microbiology: Recent Results (from the past 240 hour(s))  Anaerobic culture     Status: None (Preliminary result)   Collection Time: 11/02/15  3:23 PM  Result Value Ref Range Status   Specimen Description WOUND  Final   Special Requests LUMBAR  Final   Gram Stain   Final    NO WBC SEEN NO SQUAMOUS EPITHELIAL CELLS SEEN FEW GRAM NEGATIVE RODS Performed at [facility]    Culture   Final    NO ANAEROBES ISOLATED; CULTURE IN PROGRESS FOR 5 DAYS Performed at [facility]    Report Status PENDING  Incomplete  Wound culture     Status: None   Collection Time: 11/02/15  3:23 PM  Result Value Ref Range Status   Specimen Description WOUND  Final   Special Requests LUMBAR  Final   Gram Stain   Final    NO WBC SEEN NO SQUAMOUS EPITHELIAL  CELLS SEEN FEW GRAM NEGATIVE RODS Performed at [facility]    Culture   Final    FEW PROTEUS MIRABILIS FEW KLEBSIELLA PNEUMONIAE Performed at [facility]    Report Status 11/06/2015 FINAL  Final   Organism ID, Bacteria PROTEUS MIRABILIS  Final   Organism ID, Bacteria KLEBSIELLA PNEUMONIAE  Final      Susceptibility   Klebsiella pneumoniae - MIC*    AMPICILLIN >=32 RESISTANT Resistant     AMPICILLIN/SULBACTAM 8 SENSITIVE Sensitive     CEFEPIME <=1 SENSITIVE Sensitive     CEFTAZIDIME <=1 SENSITIVE Sensitive     CEFTRIAXONE <=1 SENSITIVE Sensitive     CIPROFLOXACIN <=0.25 SENSITIVE Sensitive     GENTAMICIN <=1 SENSITIVE Sensitive     IMIPENEM <=0.25 SENSITIVE Sensitive     PIP/TAZO <=4 SENSITIVE Sensitive     TOBRAMYCIN <=1 SENSITIVE Sensitive     TRIMETH/SULFA Value in next row Sensitive      <=20 SENSITIVE(NOTE)    * FEW KLEBSIELLA PNEUMONIAE   Proteus mirabilis - MIC*    AMPICILLIN Value in next row Sensitive      <=20 SENSITIVE(NOTE)    AMPICILLIN/SULBACTAM Value in next row Sensitive      <=20 SENSITIVE(NOTE)    CEFEPIME  Value in next row Sensitive      <=20 SENSITIVE(NOTE)    CEFTAZIDIME Value in next row Sensitive      <=20 SENSITIVE(NOTE)    CEFTRIAXONE Value in next row Sensitive      <=20 SENSITIVE(NOTE)    CIPROFLOXACIN Value in next row Sensitive      <=20 SENSITIVE(NOTE)    GENTAMICIN Value in next row Sensitive      <=20 SENSITIVE(NOTE)    IMIPENEM Value in next row Resistant      <=20 SENSITIVE(NOTE)    PIP/TAZO Value in next row Sensitive      <=20 SENSITIVE(NOTE)    TOBRAMYCIN Value in next row Sensitive      <=20 SENSITIVE(NOTE)    TRIMETH/SULFA Value in next row Sensitive      <=20 SENSITIVE(NOTE)    * FEW PROTEUS MIRABILIS  C difficile quick scan w PCR reflex     Status: None   Collection Time: 11/04/15  1:59 PM  Result Value Ref Range Status   C Diff antigen NEGATIVE NEGATIVE Final   C Diff toxin NEGATIVE NEGATIVE Final   C  Diff interpretation Negative for toxigenic C. difficile  Final    Impression/Plan:  1. Post op wound infection - Not deep per op report.  ESR 109.  multi organisms.  No MRSA and not MRSA colonized so will stop vancomycin.  Use ceftriaxone 2 grams daily for 3 weeks total through April 5th per home health protocol Leave picc until seen by ID  Recheck ESR with April 3rd blood draw as well as lab protocol We will arrange follow up in [facility] in about 2-3 weeks.   2. Diarrhea - following laxitives.  C diff negative.   I will sign off, please call with questions. thanks

## 2015-11-06 NOTE — Progress Notes (Signed)
[facility]  Patient Status:   New pt for [facility] this admission  [facility] is providing the following services: [facility] and [facility] for home IV ABX.  [facility] hospital team will follow Ms. [name] while an inpatient to support transition home when ordered.  If patient discharges after hours, please call [phone].   Larry Sierras 11/06/2015, 9:53 PM

## 2015-11-07 ENCOUNTER — Encounter (HOSPITAL_COMMUNITY): Payer: Self-pay | Admitting: Neurological Surgery

## 2015-11-07 LAB — ANAEROBIC CULTURE: GRAM STAIN: NONE SEEN

## 2015-11-07 LAB — GLUCOSE, CAPILLARY
GLUCOSE-CAPILLARY: 124 mg/dL — AB (ref 65–99)
Glucose-Capillary: 107 mg/dL — ABNORMAL HIGH (ref 65–99)
Glucose-Capillary: 108 mg/dL — ABNORMAL HIGH (ref 65–99)
Glucose-Capillary: 121 mg/dL — ABNORMAL HIGH (ref 65–99)

## 2015-11-07 NOTE — Progress Notes (Signed)
Patient ID: [name], female   DOB: September 03, 1950, 65 y.o.   MRN: [id number] Feeling better. More active with pt, no weakness; dressing dry

## 2015-11-07 NOTE — Progress Notes (Signed)
Pt refused to get up this am stating "I don't feel good".  Percocet given to help with pain and low grade fever.  Will continue to monitor.  Fredrich Romans, RN

## 2015-11-07 NOTE — Progress Notes (Signed)
Physical Therapy Treatment Patient Details Name: [name] MRN: [id number] DOB: [birth year] Today's Date: 11/07/2015    History of Present Illness Pt is a 65 y.o. female s/p Irrigation and Debridement LUMBAR WOUNDon 11/02/2015. S/p T10-12, L2-3 laminectomies/decompression, L4-5 fusion on 10/25/2015. PMHx: obesity, R TSA, bil TKA, CHF.     PT Comments    Patient with less pain and feeling better with fever down. Mobilizing well with RW. Continues to have difficulty with return to bed (sit to sidelying). Will focus on developing a method that may work better for her.   Follow Up Recommendations  Home health PT;Supervision for mobility/OOB     Equipment Recommendations  None recommended by PT    Recommendations for Other Services       Precautions / Restrictions Precautions Precautions: Back Precaution Comments: pt able to state precautions; vc only with sit to sidelying Spinal Brace: Other (comment) (Md order for no brace)    Mobility  Bed Mobility Overal bed mobility: Needs Assistance Bed Mobility: Sit to Sidelying;Rolling Rolling: Supervision       Sit to sidelying: Mod assist General bed mobility comments: with bed flat and no rails used. Pt tends to move sit to supine with incr strain on lumbar region; to her side required assist to lift both legs up onto bed; incr time and cues to roll to supine  Transfers Overall transfer level: Needs assistance Equipment used: Rolling walker (2 wheeled) Transfers: Sit to/from Stand Sit to Stand: Supervision         General transfer comment: x3; proper use of rW  Ambulation/Gait Ambulation/Gait assistance: Min guard Ambulation Distance (Feet): 120 Feet Assistive device: Rolling walker (2 wheeled) Gait Pattern/deviations: Step-through pattern;Decreased stride length Gait velocity: decreased   General Gait Details: Upright posture and use of RW without cues; minguard for safety due to frequent pauses due to leg and  abdominal cramps   Stairs            Wheelchair Mobility    Modified Rankin (Stroke Patients Only)       Balance     Sitting balance-Leahy Scale: Good       Standing balance-Leahy Scale: Fair                      Cognition Arousal/Alertness: Awake/alert Behavior During Therapy: WFL for tasks assessed/performed Overall Cognitive Status: Within Functional Limits for tasks assessed                      Exercises      General Comments        Pertinent Vitals/Pain Pain Assessment: 0-10 Pain Score: 2  Pain Location: abdomen Pain Descriptors / Indicators: Cramping Pain Intervention(s): Limited activity within patient's tolerance;Monitored during session;Repositioned;Patient requesting pain meds-RN notified;RN gave pain meds during session    Home Living                      Prior Function            PT Goals (current goals can now be found in the care plan section) Acute Rehab PT Goals Patient Stated Goal: to decrease her pain Time For Goal Achievement: 11/10/15 Progress towards PT goals: Progressing toward goals (needs continued work on get into bed)    Frequency  Min 3X/week    PT Plan Frequency needs to be updated    Co-evaluation             End  of Session   Activity Tolerance: Patient tolerated treatment well Patient left: with call bell/phone within reach;with nursing/sitter in room;in bed;with bed alarm set     Time: [id number] PT Time Calculation (min) (ACUTE ONLY): 30 min  Charges:  $Gait Training: 8-22 mins $Therapeutic Activity: 8-22 mins                    G Codes:      Jahfari Ambers 12-06-2015, 2:08 PM Pager [phone]

## 2015-11-07 NOTE — Care Management Important Message (Signed)
Important Message  Patient Details  Name: [name] MRN: [id number] Date of Birth: [birth year]   Medicare Important Message Given:  Yes    Payslee Bateson P Victorious Cosio 11/07/2015, 12:02 PM

## 2015-11-08 LAB — GLUCOSE, CAPILLARY
GLUCOSE-CAPILLARY: 116 mg/dL — AB (ref 65–99)
GLUCOSE-CAPILLARY: 94 mg/dL (ref 65–99)
Glucose-Capillary: 112 mg/dL — ABNORMAL HIGH (ref 65–99)

## 2015-11-08 NOTE — Progress Notes (Signed)
Physical Therapy Treatment Patient Details Name: [name] MRN: [id number] DOB: June 06, 1951 Today's Date: 11/08/2015    History of Present Illness Pt is a 65 y.o. female s/p Irrigation and Debridement LUMBAR WOUNDon 11/02/2015. S/p T10-12, L2-3 laminectomies/decompression, L4-5 fusion on 10/25/2015. PMHx: obesity, R TSA, bil TKA, CHF.     PT Comments    Patient with incr bil buttock to hips to posterior thigh pain today. Limited mobility due to pain and episode of incontinence as trying to get to [facility] (placed right beside her chair). Patient discouraged with how she feels today and overall medical course. Confirmed that she has assist 24/7 on discharge, which she does. Encouragement provided and pt eager to stay up in chair for lunch. RN aware.   Follow Up Recommendations  Home health PT;Supervision for mobility/OOB     Equipment Recommendations  None recommended by PT    Recommendations for Other Services       Precautions / Restrictions Precautions Precautions: Back Precaution Comments: pt able to state and adhere to precautions Spinal Brace: Other (comment) (Md order for no brace)    Mobility  Bed Mobility               General bed mobility comments: Pt in chair; she reports sit to sidelying going much better (she is scooting farther back onto bed) and did not want to practice due to pain and need to urinate  Transfers Overall transfer level: Needs assistance Equipment used: Rolling walker (2 wheeled) Transfers: Sit to/from Stand Sit to Stand: Supervision         General transfer comment: x2; proper use of RW, slightly impulsive trying to get to [facility] (with incontinence due to "water pill"  Ambulation/Gait             General Gait Details: deferred due to pain and incontinence   Stairs            Wheelchair Mobility    Modified Rankin (Stroke Patients Only)       Balance             Standing balance-Leahy Scale: Fair Standing  balance comment: Stood for pericare x 2 min; with minguard assist pt able to "step into" briefs while standing prior to return to chair                    Cognition Arousal/Alertness: Awake/alert Behavior During Therapy: Flat affect Overall Cognitive Status: Within Functional Limits for tasks assessed                      Exercises      General Comments General comments (skin integrity, edema, etc.): Pt's description of pain in buttocks to hips down posterior thigh indicative of nerve pain. On arrival, pt sitting in recliner with legs elevated with posterior tilt to pelvis. Educated on why this is a poor position with putting more stretch on her nerves and causing lumbar flexion. She stated nursing had put her in this position and she "wasn't going to argue with them." Encouraged her to advocate for herself and not allow legs elevated. RN arrived and agreed that is not an appropriate position for this pt. At end of session, assisted pt to sit fully upright in recliner (pillows behind her back/shoulders) with feet on ~1.5 inch lift to achieve hip/knees/ankles 90/90/90.       Pertinent Vitals/Pain Pain Assessment: 0-10 Pain Score: 8  Pain Location: buttocks Pain Descriptors / Indicators: Discomfort;Grimacing Pain  Intervention(s): Limited activity within patient's tolerance;Monitored during session;Repositioned;Patient requesting pain meds-RN notified    Home Living Family/patient expects to be discharged to:: Private residence Living Arrangements: Alone Available Help at Discharge: Family;Personal care attendant;Available 24 hours/day (aide 7 days/week x 3 hours; grandsons rotate ) Type of Home: Apartment Home Access: Level entry   Home Layout: One level Home Equipment: Walker - 2 wheels;Bedside commode;Grab bars - tub/shower;Grab bars - toilet;Shower seat;Adaptive equipment      Prior Function            PT Goals (current goals can now be found in the care plan  section) Acute Rehab PT Goals Patient Stated Goal: to decrease her pain Time For Goal Achievement: 11/10/15 Progress towards PT goals: Not progressing toward goals - comment (pain and incontinence)    Frequency  Min 3X/week    PT Plan Frequency needs to be updated    Co-evaluation             End of Session   Activity Tolerance: Patient limited by pain;Treatment limited secondary to medical complications (Comment) (incontinent of urine and BM) Patient left: with call bell/phone within reach;with nursing/sitter in room;in chair     Time: [id number] PT Time Calculation (min) (ACUTE ONLY): 29 min  Charges:  $Therapeutic Activity: 23-37 mins                    G Codes:      Roye Gustafson 11/28/15, 12:09 PM Pager [phone]

## 2015-11-08 NOTE — Progress Notes (Signed)
Patient ID: [name], female   DOB: 1950/12/10, 65 y.o.   MRN: [id number] Stable ID note read will dc once we get help at home

## 2015-11-09 LAB — GLUCOSE, CAPILLARY
GLUCOSE-CAPILLARY: 107 mg/dL — AB (ref 65–99)
GLUCOSE-CAPILLARY: 111 mg/dL — AB (ref 65–99)
GLUCOSE-CAPILLARY: 90 mg/dL (ref 65–99)
GLUCOSE-CAPILLARY: 112 mg/dL — AB (ref 65–99)
Glucose-Capillary: 117 mg/dL — ABNORMAL HIGH (ref 65–99)

## 2015-11-09 NOTE — Progress Notes (Signed)
Pt discharged with daughter taking al personal belongings. Surgical dressing clean, dry and intact. Discharge instructions provided with verbal understanding. No noted distress.

## 2015-11-09 NOTE — Care Management Note (Signed)
Case Management Note  Patient Details  Name: AUDRIENA PIZZIMENTI MRN: [id number] Date of Birth: 12-04-1950  Subjective/Objective:                    Action/Plan: Patient discharging home today with resumption of [place] services. Pam with [facility] IV therapy informed of the discharge and that prescription for IV antibiotic was placed on the chart. Will updated bedside RN.   Expected Discharge Date:                  Expected Discharge Plan:  [place]  In-House Referral:     Discharge planning Services  CM Consult  Post Acute Care Choice:    Choice offered to:  Patient  DME Arranged:    DME Agency:     HH Arranged:  RN, PT, OT, Social Work [employer] Agency:  [facility]  Status of Service:  Completed, signed off  Medicare Important Message Given:  Yes Date Medicare IM Given:    Medicare IM give by:    Date Additional Medicare IM Given:    Additional Medicare Important Message give by:     If discussed at [place] of Stay Meetings, dates discussed:    Additional Comments:  Pollie Friar, RN 11/09/2015, 10:49 AM

## 2015-11-09 NOTE — Discharge Summary (Signed)
Physician Discharge Summary  Patient ID: [name] MRN: [id number] DOB/AGE: May 21, 1951 65 y.o.  Admit date: 11/02/2015 Discharge date: 11/09/2015  Admission Diagnoses:lumbar wound infection  Discharge Diagnoses:  Active Problems:   Wound infection after surgery   Discharged Condition:devrease pain  Hospital Course: surgery Consults: ID Significant Diagnostic Studies: cultures  Treatments: I&D LUMBAR WOUND  Discharge Exam: Wound healing  Disposition: home with help     Medication List    ASK your doctor about these medications        albuterol 108 (90 Base) MCG/ACT inhaler  Commonly known as:  PROVENTIL HFA;VENTOLIN HFA  Inhale 2 puffs into the lungs every 6 (six) hours as needed for wheezing.     albuterol (2.5 MG/3ML) 0.083% nebulizer solution  Commonly known as:  PROVENTIL  Take 2.5 mg by nebulization every 6 (six) hours as needed for wheezing.     citalopram 40 MG tablet  Commonly known as:  CELEXA  Take 40 mg by mouth daily.     cloNIDine 0.3 mg/24hr patch  Commonly known as:  CATAPRES - Dosed in mg/24 hr  Place 0.3 mg onto the skin every Thursday.     diphenoxylate-atropine 2.5-0.025 MG tablet  Commonly known as:  LOMOTIL  Take 1 tablet by mouth daily as needed. Upset stomach     furosemide 80 MG tablet  Commonly known as:  LASIX  Take 80 mg by mouth daily.     losartan-hydrochlorothiazide 100-12.5 MG tablet  Commonly known as:  HYZAAR  Take 1 tablet by mouth daily.     methocarbamol 500 MG tablet  Commonly known as:  ROBAXIN  Take 1 tablet (500 mg total) by mouth every 6 (six) hours as needed for muscle spasms.     metoprolol tartrate 25 MG tablet  Commonly known as:  LOPRESSOR  Take 0.5 tablets (12.5 mg total) by mouth 2 (two) times daily.     NEXIUM 40 MG capsule  Generic drug:  esomeprazole  Take 1 tablet by mouth daily.     Oxycodone HCl 10 MG Tabs  Take 1 tablet (10 mg total) by mouth every 4 (four) hours as needed.     potassium chloride SA 20 MEQ tablet  Commonly known as:  K-DUR,KLOR-CON  Take 20 mEq by mouth 2 (two) times daily.     simvastatin 20 MG tablet  Commonly known as:  ZOCOR  Take 20 mg by mouth every evening.     VIBERZI 75 MG Tabs  Generic drug:  Eluxadoline  Take 75 mg by mouth 2 (two) times daily.     Vitamin D (Ergocalciferol) 50000 units Caps capsule  Commonly known as:  DRISDOL  Take 50,000 Units by mouth every 7 (seven) days. On Wednesday         Signed: Floyce Stakes 11/09/2015, 9:51 AM

## 2015-11-10 DIAGNOSIS — E785 Hyperlipidemia, unspecified: Secondary | ICD-10-CM | POA: Diagnosis not present

## 2015-11-10 DIAGNOSIS — K219 Gastro-esophageal reflux disease without esophagitis: Secondary | ICD-10-CM | POA: Diagnosis not present

## 2015-11-10 DIAGNOSIS — M150 Primary generalized (osteo)arthritis: Secondary | ICD-10-CM | POA: Diagnosis not present

## 2015-11-10 DIAGNOSIS — K589 Irritable bowel syndrome without diarrhea: Secondary | ICD-10-CM | POA: Diagnosis not present

## 2015-11-10 DIAGNOSIS — Z8701 Personal history of pneumonia (recurrent): Secondary | ICD-10-CM | POA: Diagnosis not present

## 2015-11-10 DIAGNOSIS — T814XXD Infection following a procedure, subsequent encounter: Secondary | ICD-10-CM | POA: Diagnosis not present

## 2015-11-10 DIAGNOSIS — F419 Anxiety disorder, unspecified: Secondary | ICD-10-CM | POA: Diagnosis not present

## 2015-11-10 DIAGNOSIS — I10 Essential (primary) hypertension: Secondary | ICD-10-CM | POA: Diagnosis not present

## 2015-11-10 DIAGNOSIS — I509 Heart failure, unspecified: Secondary | ICD-10-CM | POA: Diagnosis not present

## 2015-11-13 DIAGNOSIS — Z792 Long term (current) use of antibiotics: Secondary | ICD-10-CM | POA: Diagnosis not present

## 2015-11-13 DIAGNOSIS — M150 Primary generalized (osteo)arthritis: Secondary | ICD-10-CM | POA: Diagnosis not present

## 2015-11-13 DIAGNOSIS — K219 Gastro-esophageal reflux disease without esophagitis: Secondary | ICD-10-CM | POA: Diagnosis not present

## 2015-11-13 DIAGNOSIS — I509 Heart failure, unspecified: Secondary | ICD-10-CM | POA: Diagnosis not present

## 2015-11-13 DIAGNOSIS — K589 Irritable bowel syndrome without diarrhea: Secondary | ICD-10-CM | POA: Diagnosis not present

## 2015-11-13 DIAGNOSIS — I10 Essential (primary) hypertension: Secondary | ICD-10-CM | POA: Diagnosis not present

## 2015-11-13 DIAGNOSIS — T814XXD Infection following a procedure, subsequent encounter: Secondary | ICD-10-CM | POA: Diagnosis not present

## 2015-11-14 DIAGNOSIS — M150 Primary generalized (osteo)arthritis: Secondary | ICD-10-CM | POA: Diagnosis not present

## 2015-11-14 DIAGNOSIS — K219 Gastro-esophageal reflux disease without esophagitis: Secondary | ICD-10-CM | POA: Diagnosis not present

## 2015-11-14 DIAGNOSIS — T814XXD Infection following a procedure, subsequent encounter: Secondary | ICD-10-CM | POA: Diagnosis not present

## 2015-11-14 DIAGNOSIS — I509 Heart failure, unspecified: Secondary | ICD-10-CM | POA: Diagnosis not present

## 2015-11-14 DIAGNOSIS — K589 Irritable bowel syndrome without diarrhea: Secondary | ICD-10-CM | POA: Diagnosis not present

## 2015-11-14 DIAGNOSIS — I10 Essential (primary) hypertension: Secondary | ICD-10-CM | POA: Diagnosis not present

## 2015-11-14 NOTE — Progress Notes (Signed)
   11/03/15 1119  PT G-Codes **NOT FOR INPATIENT CLASS**  Functional Assessment Tool Used assist level  Functional Limitation Mobility: Walking and moving around  Mobility: Walking and Moving Around Current Status [phone]) CI  Mobility: Walking and Moving Around Goal Status [phone]) [facility]  2015/11/15 late entry g-codes Zsofia Prout B. [place], [place], DPT [phone]

## 2015-11-15 DIAGNOSIS — M150 Primary generalized (osteo)arthritis: Secondary | ICD-10-CM | POA: Diagnosis not present

## 2015-11-15 DIAGNOSIS — K589 Irritable bowel syndrome without diarrhea: Secondary | ICD-10-CM | POA: Diagnosis not present

## 2015-11-15 DIAGNOSIS — I10 Essential (primary) hypertension: Secondary | ICD-10-CM | POA: Diagnosis not present

## 2015-11-15 DIAGNOSIS — I509 Heart failure, unspecified: Secondary | ICD-10-CM | POA: Diagnosis not present

## 2015-11-15 DIAGNOSIS — K219 Gastro-esophageal reflux disease without esophagitis: Secondary | ICD-10-CM | POA: Diagnosis not present

## 2015-11-15 DIAGNOSIS — T814XXD Infection following a procedure, subsequent encounter: Secondary | ICD-10-CM | POA: Diagnosis not present

## 2015-11-15 NOTE — Progress Notes (Signed)
Late entry for missed g-codes     29-Nov-2015 0700  OT G-codes **NOT FOR INPATIENT CLASS**  Functional Assessment Tool Used Clinical judgement  Functional Limitation Self care  Self Care Current Status [phone]) CK  Self Care Goal Status [id number]) CK  Self Care Discharge Status [id number]) CK   Truman Hayward M.S., OTR/L Pager: [phone]

## 2015-11-16 DIAGNOSIS — K589 Irritable bowel syndrome without diarrhea: Secondary | ICD-10-CM | POA: Diagnosis not present

## 2015-11-16 DIAGNOSIS — K219 Gastro-esophageal reflux disease without esophagitis: Secondary | ICD-10-CM | POA: Diagnosis not present

## 2015-11-16 DIAGNOSIS — T814XXD Infection following a procedure, subsequent encounter: Secondary | ICD-10-CM | POA: Diagnosis not present

## 2015-11-16 DIAGNOSIS — I10 Essential (primary) hypertension: Secondary | ICD-10-CM | POA: Diagnosis not present

## 2015-11-16 DIAGNOSIS — I509 Heart failure, unspecified: Secondary | ICD-10-CM | POA: Diagnosis not present

## 2015-11-16 DIAGNOSIS — M150 Primary generalized (osteo)arthritis: Secondary | ICD-10-CM | POA: Diagnosis not present

## 2015-11-17 DIAGNOSIS — K589 Irritable bowel syndrome without diarrhea: Secondary | ICD-10-CM | POA: Diagnosis not present

## 2015-11-17 DIAGNOSIS — I509 Heart failure, unspecified: Secondary | ICD-10-CM | POA: Diagnosis not present

## 2015-11-17 DIAGNOSIS — I10 Essential (primary) hypertension: Secondary | ICD-10-CM | POA: Diagnosis not present

## 2015-11-17 DIAGNOSIS — K219 Gastro-esophageal reflux disease without esophagitis: Secondary | ICD-10-CM | POA: Diagnosis not present

## 2015-11-17 DIAGNOSIS — M150 Primary generalized (osteo)arthritis: Secondary | ICD-10-CM | POA: Diagnosis not present

## 2015-11-17 DIAGNOSIS — T814XXD Infection following a procedure, subsequent encounter: Secondary | ICD-10-CM | POA: Diagnosis not present

## 2015-11-18 DIAGNOSIS — I509 Heart failure, unspecified: Secondary | ICD-10-CM | POA: Diagnosis not present

## 2015-11-18 DIAGNOSIS — M150 Primary generalized (osteo)arthritis: Secondary | ICD-10-CM | POA: Diagnosis not present

## 2015-11-18 DIAGNOSIS — I10 Essential (primary) hypertension: Secondary | ICD-10-CM | POA: Diagnosis not present

## 2015-11-18 DIAGNOSIS — Z8701 Personal history of pneumonia (recurrent): Secondary | ICD-10-CM | POA: Diagnosis not present

## 2015-11-18 DIAGNOSIS — T814XXD Infection following a procedure, subsequent encounter: Secondary | ICD-10-CM | POA: Diagnosis not present

## 2015-11-18 DIAGNOSIS — K589 Irritable bowel syndrome without diarrhea: Secondary | ICD-10-CM | POA: Diagnosis not present

## 2015-11-18 DIAGNOSIS — K219 Gastro-esophageal reflux disease without esophagitis: Secondary | ICD-10-CM | POA: Diagnosis not present

## 2015-11-20 ENCOUNTER — Telehealth: Payer: Self-pay | Admitting: *Deleted

## 2015-11-20 DIAGNOSIS — K589 Irritable bowel syndrome without diarrhea: Secondary | ICD-10-CM | POA: Diagnosis not present

## 2015-11-20 DIAGNOSIS — Z792 Long term (current) use of antibiotics: Secondary | ICD-10-CM | POA: Diagnosis not present

## 2015-11-20 DIAGNOSIS — M150 Primary generalized (osteo)arthritis: Secondary | ICD-10-CM | POA: Diagnosis not present

## 2015-11-20 DIAGNOSIS — K219 Gastro-esophageal reflux disease without esophagitis: Secondary | ICD-10-CM | POA: Diagnosis not present

## 2015-11-20 DIAGNOSIS — T814XXD Infection following a procedure, subsequent encounter: Secondary | ICD-10-CM | POA: Diagnosis not present

## 2015-11-20 DIAGNOSIS — I509 Heart failure, unspecified: Secondary | ICD-10-CM | POA: Diagnosis not present

## 2015-11-20 DIAGNOSIS — I10 Essential (primary) hypertension: Secondary | ICD-10-CM | POA: Diagnosis not present

## 2015-11-20 NOTE — Telephone Encounter (Signed)
Last day of antibiotics, 11/22/15.  PICC to be maintained until ID visit per Dr. Henreitta Leber hospital note.  RN spoke to [place] at [facility] and shared this message.  Pt's HSFU is 12/06/15 with Dr. Megan Salon.  [facility] to teach pt to maintain PICC and redress site.

## 2015-11-22 DIAGNOSIS — T814XXD Infection following a procedure, subsequent encounter: Secondary | ICD-10-CM | POA: Diagnosis not present

## 2015-11-22 DIAGNOSIS — K219 Gastro-esophageal reflux disease without esophagitis: Secondary | ICD-10-CM | POA: Diagnosis not present

## 2015-11-22 DIAGNOSIS — I509 Heart failure, unspecified: Secondary | ICD-10-CM | POA: Diagnosis not present

## 2015-11-22 DIAGNOSIS — K589 Irritable bowel syndrome without diarrhea: Secondary | ICD-10-CM | POA: Diagnosis not present

## 2015-11-22 DIAGNOSIS — I10 Essential (primary) hypertension: Secondary | ICD-10-CM | POA: Diagnosis not present

## 2015-11-22 DIAGNOSIS — M150 Primary generalized (osteo)arthritis: Secondary | ICD-10-CM | POA: Diagnosis not present

## 2015-11-23 DIAGNOSIS — T814XXD Infection following a procedure, subsequent encounter: Secondary | ICD-10-CM | POA: Diagnosis not present

## 2015-11-23 DIAGNOSIS — K219 Gastro-esophageal reflux disease without esophagitis: Secondary | ICD-10-CM | POA: Diagnosis not present

## 2015-11-23 DIAGNOSIS — I10 Essential (primary) hypertension: Secondary | ICD-10-CM | POA: Diagnosis not present

## 2015-11-23 DIAGNOSIS — K589 Irritable bowel syndrome without diarrhea: Secondary | ICD-10-CM | POA: Diagnosis not present

## 2015-11-23 DIAGNOSIS — M150 Primary generalized (osteo)arthritis: Secondary | ICD-10-CM | POA: Diagnosis not present

## 2015-11-23 DIAGNOSIS — I509 Heart failure, unspecified: Secondary | ICD-10-CM | POA: Diagnosis not present

## 2015-11-24 DIAGNOSIS — M150 Primary generalized (osteo)arthritis: Secondary | ICD-10-CM | POA: Diagnosis not present

## 2015-11-24 DIAGNOSIS — I509 Heart failure, unspecified: Secondary | ICD-10-CM | POA: Diagnosis not present

## 2015-11-24 DIAGNOSIS — I10 Essential (primary) hypertension: Secondary | ICD-10-CM | POA: Diagnosis not present

## 2015-11-24 DIAGNOSIS — K219 Gastro-esophageal reflux disease without esophagitis: Secondary | ICD-10-CM | POA: Diagnosis not present

## 2015-11-24 DIAGNOSIS — T814XXD Infection following a procedure, subsequent encounter: Secondary | ICD-10-CM | POA: Diagnosis not present

## 2015-11-24 DIAGNOSIS — K589 Irritable bowel syndrome without diarrhea: Secondary | ICD-10-CM | POA: Diagnosis not present

## 2015-11-27 DIAGNOSIS — K219 Gastro-esophageal reflux disease without esophagitis: Secondary | ICD-10-CM | POA: Diagnosis not present

## 2015-11-27 DIAGNOSIS — I509 Heart failure, unspecified: Secondary | ICD-10-CM | POA: Diagnosis not present

## 2015-11-27 DIAGNOSIS — K589 Irritable bowel syndrome without diarrhea: Secondary | ICD-10-CM | POA: Diagnosis not present

## 2015-11-27 DIAGNOSIS — M150 Primary generalized (osteo)arthritis: Secondary | ICD-10-CM | POA: Diagnosis not present

## 2015-11-27 DIAGNOSIS — Z792 Long term (current) use of antibiotics: Secondary | ICD-10-CM | POA: Diagnosis not present

## 2015-11-27 DIAGNOSIS — T814XXD Infection following a procedure, subsequent encounter: Secondary | ICD-10-CM | POA: Diagnosis not present

## 2015-11-27 DIAGNOSIS — I10 Essential (primary) hypertension: Secondary | ICD-10-CM | POA: Diagnosis not present

## 2015-11-28 DIAGNOSIS — I10 Essential (primary) hypertension: Secondary | ICD-10-CM | POA: Diagnosis not present

## 2015-11-28 DIAGNOSIS — T814XXD Infection following a procedure, subsequent encounter: Secondary | ICD-10-CM | POA: Diagnosis not present

## 2015-11-28 DIAGNOSIS — I509 Heart failure, unspecified: Secondary | ICD-10-CM | POA: Diagnosis not present

## 2015-11-28 DIAGNOSIS — M150 Primary generalized (osteo)arthritis: Secondary | ICD-10-CM | POA: Diagnosis not present

## 2015-11-28 DIAGNOSIS — K219 Gastro-esophageal reflux disease without esophagitis: Secondary | ICD-10-CM | POA: Diagnosis not present

## 2015-11-28 DIAGNOSIS — K589 Irritable bowel syndrome without diarrhea: Secondary | ICD-10-CM | POA: Diagnosis not present

## 2015-11-30 DIAGNOSIS — M150 Primary generalized (osteo)arthritis: Secondary | ICD-10-CM | POA: Diagnosis not present

## 2015-11-30 DIAGNOSIS — K589 Irritable bowel syndrome without diarrhea: Secondary | ICD-10-CM | POA: Diagnosis not present

## 2015-11-30 DIAGNOSIS — T814XXD Infection following a procedure, subsequent encounter: Secondary | ICD-10-CM | POA: Diagnosis not present

## 2015-11-30 DIAGNOSIS — I10 Essential (primary) hypertension: Secondary | ICD-10-CM | POA: Diagnosis not present

## 2015-11-30 DIAGNOSIS — K219 Gastro-esophageal reflux disease without esophagitis: Secondary | ICD-10-CM | POA: Diagnosis not present

## 2015-11-30 DIAGNOSIS — I509 Heart failure, unspecified: Secondary | ICD-10-CM | POA: Diagnosis not present

## 2015-12-02 ENCOUNTER — Emergency Department (HOSPITAL_COMMUNITY)
Admission: EM | Admit: 2015-12-02 | Discharge: 2015-12-02 | Disposition: A | Discharge status: Home / Self Care | Payer: Medicare Other | Source: Home / Self Care | Attending: Emergency Medicine | Admitting: Emergency Medicine

## 2015-12-02 ENCOUNTER — Emergency Department (HOSPITAL_COMMUNITY): Payer: Medicare Other

## 2015-12-02 ENCOUNTER — Encounter (HOSPITAL_COMMUNITY): Payer: Self-pay

## 2015-12-02 DIAGNOSIS — E7800 Pure hypercholesterolemia, unspecified: Secondary | ICD-10-CM

## 2015-12-02 DIAGNOSIS — R112 Nausea with vomiting, unspecified: Secondary | ICD-10-CM

## 2015-12-02 DIAGNOSIS — K219 Gastro-esophageal reflux disease without esophagitis: Secondary | ICD-10-CM | POA: Diagnosis present

## 2015-12-02 DIAGNOSIS — I10 Essential (primary) hypertension: Secondary | ICD-10-CM | POA: Insufficient documentation

## 2015-12-02 DIAGNOSIS — Z79899 Other long term (current) drug therapy: Secondary | ICD-10-CM

## 2015-12-02 DIAGNOSIS — Z96653 Presence of artificial knee joint, bilateral: Secondary | ICD-10-CM | POA: Diagnosis not present

## 2015-12-02 DIAGNOSIS — Z9889 Other specified postprocedural states: Secondary | ICD-10-CM

## 2015-12-02 DIAGNOSIS — I5042 Chronic combined systolic (congestive) and diastolic (congestive) heart failure: Secondary | ICD-10-CM | POA: Diagnosis present

## 2015-12-02 DIAGNOSIS — I509 Heart failure, unspecified: Secondary | ICD-10-CM

## 2015-12-02 DIAGNOSIS — K589 Irritable bowel syndrome without diarrhea: Secondary | ICD-10-CM | POA: Diagnosis present

## 2015-12-02 DIAGNOSIS — M545 Low back pain: Secondary | ICD-10-CM | POA: Diagnosis present

## 2015-12-02 DIAGNOSIS — Z961 Presence of intraocular lens: Secondary | ICD-10-CM | POA: Diagnosis not present

## 2015-12-02 DIAGNOSIS — Z86018 Personal history of other benign neoplasm: Secondary | ICD-10-CM

## 2015-12-02 DIAGNOSIS — Z79891 Long term (current) use of opiate analgesic: Secondary | ICD-10-CM

## 2015-12-02 DIAGNOSIS — Z91018 Allergy to other foods: Secondary | ICD-10-CM

## 2015-12-02 DIAGNOSIS — M549 Dorsalgia, unspecified: Secondary | ICD-10-CM | POA: Insufficient documentation

## 2015-12-02 DIAGNOSIS — Z6841 Body Mass Index (BMI) 40.0 and over, adult: Secondary | ICD-10-CM

## 2015-12-02 DIAGNOSIS — Z96611 Presence of right artificial shoulder joint: Secondary | ICD-10-CM | POA: Diagnosis present

## 2015-12-02 DIAGNOSIS — Z8701 Personal history of pneumonia (recurrent): Secondary | ICD-10-CM | POA: Insufficient documentation

## 2015-12-02 DIAGNOSIS — Z981 Arthrodesis status: Secondary | ICD-10-CM

## 2015-12-02 DIAGNOSIS — Z885 Allergy status to narcotic agent status: Secondary | ICD-10-CM | POA: Diagnosis not present

## 2015-12-02 DIAGNOSIS — Z87448 Personal history of other diseases of urinary system: Secondary | ICD-10-CM

## 2015-12-02 DIAGNOSIS — Z9842 Cataract extraction status, left eye: Secondary | ICD-10-CM

## 2015-12-02 DIAGNOSIS — R1031 Right lower quadrant pain: Secondary | ICD-10-CM | POA: Diagnosis present

## 2015-12-02 DIAGNOSIS — R159 Full incontinence of feces: Secondary | ICD-10-CM

## 2015-12-02 DIAGNOSIS — E669 Obesity, unspecified: Secondary | ICD-10-CM | POA: Insufficient documentation

## 2015-12-02 DIAGNOSIS — Z91013 Allergy to seafood: Secondary | ICD-10-CM | POA: Diagnosis not present

## 2015-12-02 DIAGNOSIS — G8929 Other chronic pain: Secondary | ICD-10-CM | POA: Insufficient documentation

## 2015-12-02 DIAGNOSIS — I110 Hypertensive heart disease with heart failure: Secondary | ICD-10-CM | POA: Diagnosis not present

## 2015-12-02 DIAGNOSIS — R1084 Generalized abdominal pain: Secondary | ICD-10-CM | POA: Diagnosis not present

## 2015-12-02 DIAGNOSIS — E785 Hyperlipidemia, unspecified: Secondary | ICD-10-CM | POA: Diagnosis present

## 2015-12-02 DIAGNOSIS — Z91012 Allergy to eggs: Secondary | ICD-10-CM | POA: Diagnosis not present

## 2015-12-02 DIAGNOSIS — K5900 Constipation, unspecified: Secondary | ICD-10-CM | POA: Diagnosis not present

## 2015-12-02 DIAGNOSIS — Z8619 Personal history of other infectious and parasitic diseases: Secondary | ICD-10-CM | POA: Insufficient documentation

## 2015-12-02 DIAGNOSIS — G894 Chronic pain syndrome: Secondary | ICD-10-CM | POA: Diagnosis present

## 2015-12-02 DIAGNOSIS — T8131XA Disruption of external operation (surgical) wound, not elsewhere classified, initial encounter: Secondary | ICD-10-CM | POA: Diagnosis present

## 2015-12-02 DIAGNOSIS — M5489 Other dorsalgia: Secondary | ICD-10-CM

## 2015-12-02 DIAGNOSIS — Z8709 Personal history of other diseases of the respiratory system: Secondary | ICD-10-CM | POA: Insufficient documentation

## 2015-12-02 DIAGNOSIS — Z91041 Radiographic dye allergy status: Secondary | ICD-10-CM | POA: Diagnosis not present

## 2015-12-02 DIAGNOSIS — F419 Anxiety disorder, unspecified: Secondary | ICD-10-CM | POA: Insufficient documentation

## 2015-12-02 DIAGNOSIS — M1990 Unspecified osteoarthritis, unspecified site: Secondary | ICD-10-CM

## 2015-12-02 DIAGNOSIS — T8189XA Other complications of procedures, not elsewhere classified, initial encounter: Secondary | ICD-10-CM | POA: Diagnosis not present

## 2015-12-02 DIAGNOSIS — R109 Unspecified abdominal pain: Secondary | ICD-10-CM | POA: Diagnosis not present

## 2015-12-02 LAB — CBC WITH DIFFERENTIAL/PLATELET
Basophils Absolute: 0 10*3/uL (ref 0.0–0.1)
Basophils Relative: 0 %
EOS ABS: 0.1 10*3/uL (ref 0.0–0.7)
EOS PCT: 3 %
HCT: 32.3 % — ABNORMAL LOW (ref 36.0–46.0)
HEMOGLOBIN: 10.2 g/dL — AB (ref 12.0–15.0)
LYMPHS ABS: 1.3 10*3/uL (ref 0.7–4.0)
LYMPHS PCT: 35 %
MCH: 26.6 pg (ref 26.0–34.0)
MCHC: 31.6 g/dL (ref 30.0–36.0)
MCV: 84.1 fL (ref 78.0–100.0)
MONOS PCT: 15 %
Monocytes Absolute: 0.6 10*3/uL (ref 0.1–1.0)
Neutro Abs: 1.8 10*3/uL (ref 1.7–7.7)
Neutrophils Relative %: 47 %
PLATELETS: 318 10*3/uL (ref 150–400)
RBC: 3.84 MIL/uL — AB (ref 3.87–5.11)
RDW: 13.4 % (ref 11.5–15.5)
WBC: 3.9 10*3/uL — ABNORMAL LOW (ref 4.0–10.5)

## 2015-12-02 LAB — COMPREHENSIVE METABOLIC PANEL
ALK PHOS: 70 U/L (ref 38–126)
ALT: 11 U/L — AB (ref 14–54)
ANION GAP: 14 (ref 5–15)
AST: 20 U/L (ref 15–41)
Albumin: 2.6 g/dL — ABNORMAL LOW (ref 3.5–5.0)
BUN: <5 mg/dL — ABNORMAL LOW (ref 6–20)
CALCIUM: 8.7 mg/dL — AB (ref 8.9–10.3)
CO2: 19 mmol/L — AB (ref 22–32)
CREATININE: 0.72 mg/dL (ref 0.44–1.00)
Chloride: 108 mmol/L (ref 101–111)
Glucose, Bld: 113 mg/dL — ABNORMAL HIGH (ref 65–99)
Potassium: 3.4 mmol/L — ABNORMAL LOW (ref 3.5–5.1)
SODIUM: 141 mmol/L (ref 135–145)
Total Bilirubin: 0.4 mg/dL (ref 0.3–1.2)
Total Protein: 6.9 g/dL (ref 6.5–8.1)

## 2015-12-02 LAB — I-STAT CG4 LACTIC ACID, ED: LACTIC ACID, VENOUS: 1.32 mmol/L (ref 0.5–2.0)

## 2015-12-02 LAB — URINALYSIS, ROUTINE W REFLEX MICROSCOPIC
Glucose, UA: NEGATIVE mg/dL
HGB URINE DIPSTICK: NEGATIVE
KETONES UR: 40 mg/dL — AB
NITRITE: NEGATIVE
PROTEIN: 30 mg/dL — AB
SPECIFIC GRAVITY, URINE: 1.026 (ref 1.005–1.030)
pH: 7 (ref 5.0–8.0)

## 2015-12-02 LAB — URINE MICROSCOPIC-ADD ON

## 2015-12-02 LAB — LIPASE, BLOOD: LIPASE: 63 U/L — AB (ref 11–51)

## 2015-12-02 MED ORDER — POLYETHYLENE GLYCOL 3350 17 GM/SCOOP PO POWD: ORAL | Status: DC

## 2015-12-02 MED ORDER — ONDANSETRON 4 MG PO TBDP: 4.0000 mg | ORAL_TABLET | Freq: Three times a day (TID) | ORAL | Status: DC | PRN

## 2015-12-02 MED ORDER — ONDANSETRON HCL 4 MG/2ML IJ SOLN
4.0000 mg | Freq: Once | INTRAMUSCULAR | Status: AC
  Administered 2015-12-02: 4 mg via INTRAVENOUS
  Filled 2015-12-02: qty 2

## 2015-12-02 MED ORDER — MORPHINE SULFATE (PF) 4 MG/ML IV SOLN
4.0000 mg | Freq: Once | INTRAVENOUS | Status: AC
  Administered 2015-12-02: 4 mg via INTRAVENOUS
  Filled 2015-12-02: qty 1

## 2015-12-02 NOTE — ED Notes (Signed)
Notified [facility] for transportation back home 

## 2015-12-02 NOTE — ED Notes (Signed)
Dr. Dina Rich to bedside

## 2015-12-02 NOTE — ED Notes (Addendum)
Went to discharge pt home, pt called her daughter, who was at home, for a ride. Daughter asked pt how she was going to get out of her car and can the ambulance take her home. Pt reports that daughters car is too low for her to get in and out of. Pt reports that she ambulates with a walker.  Informed pt that we could call [facility] but there may be a charge to her. Pt verbalizes understanding. Secretary notified to call [facility] to take pt home.

## 2015-12-02 NOTE — Discharge Instructions (Signed)
Abdominal Pain, Adult °Many things can cause abdominal pain. Usually, abdominal pain is not caused by a disease and will improve without treatment. It can often be observed and treated at home. Your health care provider will do a physical exam and possibly order blood tests and X-rays to help determine the seriousness of your pain. However, in many cases, more time must pass before a clear cause of the pain can be found. Before that point, your health care provider may not know if you need more testing or further treatment. °HOME CARE INSTRUCTIONS °Monitor your abdominal pain for any changes. The following actions may help to alleviate any discomfort you are experiencing: °· Only take over-the-counter or prescription medicines as directed by your health care provider. °· Do not take laxatives unless directed to do so by your health care provider. °· Try a clear liquid diet (broth, tea, or water) as directed by your health care provider. Slowly move to a bland diet as tolerated. °SEEK MEDICAL CARE IF: °· You have unexplained abdominal pain. °· You have abdominal pain associated with nausea or diarrhea. °· You have pain when you urinate or have a bowel movement. °· You experience abdominal pain that wakes you in the night. °· You have abdominal pain that is worsened or improved by eating food. °· You have abdominal pain that is worsened with eating fatty foods. °· You have a fever. °SEEK IMMEDIATE MEDICAL CARE IF: °· Your pain does not go away within 2 hours. °· You keep throwing up (vomiting). °· Your pain is felt only in portions of the abdomen, such as the right side or the left lower portion of the abdomen. °· You pass bloody or black tarry stools. °MAKE SURE YOU: °· Understand these instructions. °· Will watch your condition. °· Will get help right away if you are not doing well or get worse. °  °This information is not intended to replace advice given to you by your health care provider. Make sure you discuss  any questions you have with your health care provider. °  °Document Released: 05/15/2005 Document Revised: 04/26/2015 Document Reviewed: 04/14/2013 °Elsevier Interactive Patient Education ©2016 [employer] ° °Constipation, Adult °Constipation is when a person has fewer than three bowel movements a week, has difficulty having a bowel movement, or has stools that are dry, hard, or larger than normal. As people grow older, constipation is more common. A low-fiber diet, not taking in enough fluids, and taking certain medicines may make constipation worse.  °CAUSES  °· Certain medicines, such as antidepressants, pain medicine, iron supplements, antacids, and water pills.   °· Certain diseases, such as diabetes, irritable bowel syndrome (IBS), thyroid disease, or depression.   °· Not drinking enough water.   °· Not eating enough fiber-rich foods.   °· Stress or travel.   °· Lack of physical activity or exercise.   °· Ignoring the urge to have a bowel movement.   °· Using laxatives too much.   °SIGNS AND SYMPTOMS  °· Having fewer than three bowel movements a week.   °· Straining to have a bowel movement.   °· Having stools that are hard, dry, or larger than normal.   °· Feeling full or bloated.   °· Pain in the lower abdomen.   °· Not feeling relief after having a bowel movement.   °DIAGNOSIS  °Your health care provider will take a medical history and perform a physical exam. Further testing may be done for severe constipation. Some tests may include: °· A barium enema X-ray to examine your rectum, colon, and, sometimes,   your small intestine.   °· A sigmoidoscopy to examine your lower colon.   °· A colonoscopy to examine your entire colon. °TREATMENT  °Treatment will depend on the severity of your constipation and what is causing it. Some dietary treatments include drinking more fluids and eating more fiber-rich foods. Lifestyle treatments may include regular exercise. If these diet and lifestyle recommendations do not  help, your health care provider may recommend taking over-the-counter laxative medicines to help you have bowel movements. Prescription medicines may be prescribed if over-the-counter medicines do not work.  °HOME CARE INSTRUCTIONS  °· Eat foods that have a lot of fiber, such as fruits, vegetables, whole grains, and beans. °· Limit foods high in fat and processed sugars, such as french fries, hamburgers, cookies, candies, and soda.   °· A fiber supplement may be added to your diet if you cannot get enough fiber from foods.   °· Drink enough fluids to keep your urine clear or pale yellow.   °· Exercise regularly or as directed by your health care provider.   °· Go to the restroom when you have the urge to go. Do not hold it.   °· Only take over-the-counter or prescription medicines as directed by your health care provider. Do not take other medicines for constipation without talking to your health care provider first.   °SEEK IMMEDIATE MEDICAL CARE IF:  °· You have bright red blood in your stool.   °· Your constipation lasts for more than 4 days or gets worse.   °· You have abdominal or rectal pain.   °· You have thin, pencil-like stools.   °· You have unexplained weight loss. °MAKE SURE YOU:  °· Understand these instructions. °· Will watch your condition. °· Will get help right away if you are not doing well or get worse. °  °This information is not intended to replace advice given to you by your health care provider. Make sure you discuss any questions you have with your health care provider. °  °Document Released: 05/03/2004 Document Revised: 08/26/2014 Document Reviewed: 05/17/2013 °Elsevier Interactive Patient Education ©2016 [employer]. ° °
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
## 2015-12-02 NOTE — ED Notes (Signed)
Lungs clear bilaterally , pupils 3 reactive. EMS states that they found a bottle of OxyContin quantity read 60, filled on 11/30/15, the bottle was empty. Pt sates that there wasn't 86 when she received, she states that one of her children picked up her medication for her and they give her 1 pill as she needs it.

## 2015-12-02 NOTE — ED Notes (Signed)
Per [facility]: pt having lower back pain, pt had surgery on March 8th. Pt states that she woke up today with increased back pain and abdominal pain. RLQ abdominal pain, tender upon palpation. Pt also having nausea and vomiting. Pt states that three weeks ago she had to go back into surgery due to infection. Pt is unsure of what type of surgery she had. Pt did have a bowel movement in route, she states that this sometimes happens due to her IBS.

## 2015-12-04 ENCOUNTER — Encounter (HOSPITAL_COMMUNITY): Payer: Self-pay

## 2015-12-04 ENCOUNTER — Inpatient Hospital Stay (HOSPITAL_COMMUNITY)
Admission: EM | Admit: 2015-12-04 | Discharge: 2015-12-08 | DRG: 392 | Disposition: A | Discharge status: Home Health | Payer: Medicare Other | Attending: Family Medicine | Admitting: Family Medicine

## 2015-12-04 ENCOUNTER — Inpatient Hospital Stay (HOSPITAL_COMMUNITY): Payer: Medicare Other

## 2015-12-04 DIAGNOSIS — I509 Heart failure, unspecified: Secondary | ICD-10-CM | POA: Diagnosis not present

## 2015-12-04 DIAGNOSIS — K219 Gastro-esophageal reflux disease without esophagitis: Secondary | ICD-10-CM | POA: Diagnosis not present

## 2015-12-04 DIAGNOSIS — Z452 Encounter for adjustment and management of vascular access device: Secondary | ICD-10-CM

## 2015-12-04 DIAGNOSIS — M150 Primary generalized (osteo)arthritis: Secondary | ICD-10-CM | POA: Diagnosis not present

## 2015-12-04 DIAGNOSIS — I10 Essential (primary) hypertension: Secondary | ICD-10-CM | POA: Diagnosis present

## 2015-12-04 DIAGNOSIS — R1031 Right lower quadrant pain: Secondary | ICD-10-CM | POA: Diagnosis not present

## 2015-12-04 DIAGNOSIS — I5041 Acute combined systolic (congestive) and diastolic (congestive) heart failure: Secondary | ICD-10-CM | POA: Diagnosis present

## 2015-12-04 DIAGNOSIS — R1110 Vomiting, unspecified: Secondary | ICD-10-CM | POA: Diagnosis not present

## 2015-12-04 DIAGNOSIS — K589 Irritable bowel syndrome without diarrhea: Secondary | ICD-10-CM | POA: Diagnosis not present

## 2015-12-04 DIAGNOSIS — K5900 Constipation, unspecified: Secondary | ICD-10-CM | POA: Diagnosis not present

## 2015-12-04 DIAGNOSIS — M545 Low back pain: Secondary | ICD-10-CM | POA: Diagnosis not present

## 2015-12-04 DIAGNOSIS — D6489 Other specified anemias: Secondary | ICD-10-CM | POA: Insufficient documentation

## 2015-12-04 DIAGNOSIS — T8149XA Infection following a procedure, other surgical site, initial encounter: Secondary | ICD-10-CM | POA: Diagnosis present

## 2015-12-04 DIAGNOSIS — G894 Chronic pain syndrome: Secondary | ICD-10-CM

## 2015-12-04 DIAGNOSIS — R112 Nausea with vomiting, unspecified: Secondary | ICD-10-CM | POA: Diagnosis present

## 2015-12-04 DIAGNOSIS — I5042 Chronic combined systolic (congestive) and diastolic (congestive) heart failure: Secondary | ICD-10-CM | POA: Insufficient documentation

## 2015-12-04 DIAGNOSIS — R748 Abnormal levels of other serum enzymes: Secondary | ICD-10-CM | POA: Diagnosis not present

## 2015-12-04 DIAGNOSIS — T814XXD Infection following a procedure, subsequent encounter: Secondary | ICD-10-CM | POA: Diagnosis not present

## 2015-12-04 DIAGNOSIS — Z792 Long term (current) use of antibiotics: Secondary | ICD-10-CM | POA: Diagnosis not present

## 2015-12-04 LAB — CBC WITH DIFFERENTIAL/PLATELET
Basophils Absolute: 0 10*3/uL (ref 0.0–0.1)
Basophils Relative: 0 %
EOS ABS: 0.1 10*3/uL (ref 0.0–0.7)
EOS PCT: 3 %
HCT: 34.7 % — ABNORMAL LOW (ref 36.0–46.0)
HEMOGLOBIN: 11.1 g/dL — AB (ref 12.0–15.0)
LYMPHS ABS: 1.6 10*3/uL (ref 0.7–4.0)
LYMPHS PCT: 45 %
MCH: 27.3 pg (ref 26.0–34.0)
MCHC: 32 g/dL (ref 30.0–36.0)
MCV: 85.5 fL (ref 78.0–100.0)
MONOS PCT: 13 %
Monocytes Absolute: 0.5 10*3/uL (ref 0.1–1.0)
Neutro Abs: 1.4 10*3/uL — ABNORMAL LOW (ref 1.7–7.7)
Neutrophils Relative %: 39 %
PLATELETS: 330 10*3/uL (ref 150–400)
RBC: 4.06 MIL/uL (ref 3.87–5.11)
RDW: 14.1 % (ref 11.5–15.5)
WBC: 3.6 10*3/uL — ABNORMAL LOW (ref 4.0–10.5)

## 2015-12-04 LAB — COMPREHENSIVE METABOLIC PANEL
ALK PHOS: 72 U/L (ref 38–126)
ALT: 11 U/L — AB (ref 14–54)
ANION GAP: 13 (ref 5–15)
AST: 21 U/L (ref 15–41)
Albumin: 2.7 g/dL — ABNORMAL LOW (ref 3.5–5.0)
BUN: <5 mg/dL — ABNORMAL LOW (ref 6–20)
CALCIUM: 8.6 mg/dL — AB (ref 8.9–10.3)
CO2: 21 mmol/L — AB (ref 22–32)
CREATININE: 0.9 mg/dL (ref 0.44–1.00)
Chloride: 110 mmol/L (ref 101–111)
Glucose, Bld: 116 mg/dL — ABNORMAL HIGH (ref 65–99)
Potassium: 3.5 mmol/L (ref 3.5–5.1)
SODIUM: 144 mmol/L (ref 135–145)
Total Bilirubin: 0.5 mg/dL (ref 0.3–1.2)
Total Protein: 6.8 g/dL (ref 6.5–8.1)

## 2015-12-04 LAB — LIPASE, BLOOD: LIPASE: 52 U/L — AB (ref 11–51)

## 2015-12-04 LAB — TROPONIN I

## 2015-12-04 MED ORDER — ALBUTEROL SULFATE HFA 108 (90 BASE) MCG/ACT IN AERS: 2.0000 | INHALATION_SPRAY | Freq: Four times a day (QID) | RESPIRATORY_TRACT | Status: DC | PRN

## 2015-12-04 MED ORDER — OXYCODONE HCL 5 MG PO TABS
10.0000 mg | ORAL_TABLET | ORAL | Status: DC | PRN
  Administered 2015-12-05: 10 mg via ORAL
  Filled 2015-12-04: qty 2

## 2015-12-04 MED ORDER — PANTOPRAZOLE SODIUM 40 MG PO TBEC
40.0000 mg | DELAYED_RELEASE_TABLET | Freq: Every day | ORAL | Status: DC
  Filled 2015-12-04: qty 1

## 2015-12-04 MED ORDER — ACETAMINOPHEN 650 MG RE SUPP
650.0000 mg | Freq: Four times a day (QID) | RECTAL | Status: DC | PRN
Start: 2015-12-04 — End: 2015-12-08

## 2015-12-04 MED ORDER — ONDANSETRON HCL 4 MG/2ML IJ SOLN
4.0000 mg | INTRAMUSCULAR | Status: AC
  Administered 2015-12-04: 4 mg via INTRAVENOUS
  Filled 2015-12-04: qty 2

## 2015-12-04 MED ORDER — ACETAMINOPHEN 325 MG PO TABS: 650.0000 mg | ORAL_TABLET | Freq: Four times a day (QID) | ORAL | Status: DC | PRN

## 2015-12-04 MED ORDER — SIMVASTATIN 20 MG PO TABS
20.0000 mg | ORAL_TABLET | Freq: Every evening | ORAL | Status: DC
  Administered 2015-12-05 – 2015-12-08 (×4): 20 mg via ORAL
  Filled 2015-12-04 (×4): qty 1

## 2015-12-04 MED ORDER — POTASSIUM CHLORIDE CRYS ER 20 MEQ PO TBCR
20.0000 meq | EXTENDED_RELEASE_TABLET | Freq: Two times a day (BID) | ORAL | Status: DC
  Administered 2015-12-05: 20 meq via ORAL
  Filled 2015-12-04 (×2): qty 1

## 2015-12-04 MED ORDER — METOPROLOL TARTRATE 12.5 MG HALF TABLET
12.5000 mg | ORAL_TABLET | Freq: Two times a day (BID) | ORAL | Status: DC
  Administered 2015-12-05: 12.5 mg via ORAL
  Filled 2015-12-04 (×2): qty 1

## 2015-12-04 MED ORDER — PROCHLORPERAZINE EDISYLATE 5 MG/ML IJ SOLN
10.0000 mg | Freq: Once | INTRAMUSCULAR | Status: AC
  Administered 2015-12-04: 10 mg via INTRAVENOUS
  Filled 2015-12-04: qty 2

## 2015-12-04 MED ORDER — LORAZEPAM 2 MG/ML IJ SOLN
0.5000 mg | Freq: Once | INTRAMUSCULAR | Status: AC
  Administered 2015-12-04: 0.5 mg via INTRAVENOUS
  Filled 2015-12-04: qty 1

## 2015-12-04 MED ORDER — SODIUM CHLORIDE 0.9% FLUSH
3.0000 mL | Freq: Two times a day (BID) | INTRAVENOUS | Status: DC
  Administered 2015-12-05 – 2015-12-08 (×3): 3 mL via INTRAVENOUS

## 2015-12-04 MED ORDER — DEXTROSE 5 % IV SOLN
2.0000 g | INTRAVENOUS | Status: DC
  Administered 2015-12-05 – 2015-12-08 (×4): 2 g via INTRAVENOUS
  Filled 2015-12-04 (×5): qty 2

## 2015-12-04 MED ORDER — ONDANSETRON HCL 4 MG/2ML IJ SOLN
4.0000 mg | Freq: Four times a day (QID) | INTRAMUSCULAR | Status: DC | PRN
  Administered 2015-12-05: 4 mg via INTRAVENOUS
  Filled 2015-12-04 (×2): qty 2

## 2015-12-04 MED ORDER — ONDANSETRON HCL 4 MG PO TABS: 4.0000 mg | ORAL_TABLET | Freq: Four times a day (QID) | ORAL | Status: DC | PRN

## 2015-12-04 MED ORDER — INSULIN ASPART 100 UNIT/ML ~~LOC~~ SOLN: 0.0000 [IU] | SUBCUTANEOUS | Status: DC

## 2015-12-04 MED ORDER — CLONIDINE HCL 0.3 MG/24HR TD PTWK
0.3000 mg | MEDICATED_PATCH | TRANSDERMAL | Status: DC
  Administered 2015-12-07: 0.3 mg via TRANSDERMAL
  Filled 2015-12-04: qty 1

## 2015-12-04 MED ORDER — SODIUM CHLORIDE 0.9 % IV SOLN
INTRAVENOUS | Status: AC
  Administered 2015-12-05: 02:00:00 via INTRAVENOUS

## 2015-12-04 MED ORDER — METHOCARBAMOL 500 MG PO TABS: 500.0000 mg | ORAL_TABLET | Freq: Four times a day (QID) | ORAL | Status: DC | PRN

## 2015-12-04 MED ORDER — CITALOPRAM HYDROBROMIDE 20 MG PO TABS
40.0000 mg | ORAL_TABLET | Freq: Every day | ORAL | Status: DC
  Filled 2015-12-04: qty 2

## 2015-12-04 MED ORDER — CEFTRIAXONE SODIUM 2 G IJ SOLR: 2.0000 g | INTRAMUSCULAR | Status: DC

## 2015-12-04 MED ORDER — ALBUTEROL SULFATE (2.5 MG/3ML) 0.083% IN NEBU: 2.5000 mg | INHALATION_SOLUTION | Freq: Four times a day (QID) | RESPIRATORY_TRACT | Status: DC | PRN

## 2015-12-04 MED ORDER — ENOXAPARIN SODIUM 40 MG/0.4ML ~~LOC~~ SOLN
40.0000 mg | SUBCUTANEOUS | Status: DC
  Administered 2015-12-05 – 2015-12-08 (×4): 40 mg via SUBCUTANEOUS
  Filled 2015-12-04 (×4): qty 0.4
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
## 2015-12-04 NOTE — ED Notes (Signed)
Pt arrived via [facility] c/o N/V x1 week.  Right PICC line for wound infection.  EMS gave 4mg  Zofran IM.
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
## 2015-12-05 ENCOUNTER — Observation Stay (HOSPITAL_COMMUNITY): Payer: Medicare Other

## 2015-12-05 DIAGNOSIS — I5041 Acute combined systolic (congestive) and diastolic (congestive) heart failure: Secondary | ICD-10-CM | POA: Diagnosis not present

## 2015-12-05 DIAGNOSIS — I10 Essential (primary) hypertension: Secondary | ICD-10-CM | POA: Diagnosis not present

## 2015-12-05 DIAGNOSIS — Z96653 Presence of artificial knee joint, bilateral: Secondary | ICD-10-CM | POA: Diagnosis not present

## 2015-12-05 DIAGNOSIS — Z91041 Radiographic dye allergy status: Secondary | ICD-10-CM | POA: Diagnosis not present

## 2015-12-05 DIAGNOSIS — K589 Irritable bowel syndrome without diarrhea: Secondary | ICD-10-CM | POA: Diagnosis not present

## 2015-12-05 DIAGNOSIS — M545 Low back pain: Secondary | ICD-10-CM | POA: Diagnosis not present

## 2015-12-05 DIAGNOSIS — Z981 Arthrodesis status: Secondary | ICD-10-CM | POA: Diagnosis not present

## 2015-12-05 DIAGNOSIS — I110 Hypertensive heart disease with heart failure: Secondary | ICD-10-CM | POA: Diagnosis not present

## 2015-12-05 DIAGNOSIS — Z91018 Allergy to other foods: Secondary | ICD-10-CM | POA: Diagnosis not present

## 2015-12-05 DIAGNOSIS — Z6841 Body Mass Index (BMI) 40.0 and over, adult: Secondary | ICD-10-CM | POA: Diagnosis not present

## 2015-12-05 DIAGNOSIS — Z885 Allergy status to narcotic agent status: Secondary | ICD-10-CM | POA: Diagnosis not present

## 2015-12-05 DIAGNOSIS — E669 Obesity, unspecified: Secondary | ICD-10-CM | POA: Diagnosis not present

## 2015-12-05 DIAGNOSIS — Z79899 Other long term (current) drug therapy: Secondary | ICD-10-CM | POA: Diagnosis not present

## 2015-12-05 DIAGNOSIS — K5900 Constipation, unspecified: Secondary | ICD-10-CM | POA: Diagnosis not present

## 2015-12-05 DIAGNOSIS — R1031 Right lower quadrant pain: Secondary | ICD-10-CM | POA: Diagnosis present

## 2015-12-05 DIAGNOSIS — K219 Gastro-esophageal reflux disease without esophagitis: Secondary | ICD-10-CM | POA: Diagnosis not present

## 2015-12-05 DIAGNOSIS — Z96611 Presence of right artificial shoulder joint: Secondary | ICD-10-CM | POA: Diagnosis not present

## 2015-12-05 DIAGNOSIS — Z79891 Long term (current) use of opiate analgesic: Secondary | ICD-10-CM | POA: Diagnosis not present

## 2015-12-05 DIAGNOSIS — Z9842 Cataract extraction status, left eye: Secondary | ICD-10-CM | POA: Diagnosis not present

## 2015-12-05 DIAGNOSIS — E785 Hyperlipidemia, unspecified: Secondary | ICD-10-CM | POA: Diagnosis not present

## 2015-12-05 DIAGNOSIS — Z91012 Allergy to eggs: Secondary | ICD-10-CM | POA: Diagnosis not present

## 2015-12-05 DIAGNOSIS — Z961 Presence of intraocular lens: Secondary | ICD-10-CM | POA: Diagnosis not present

## 2015-12-05 DIAGNOSIS — Z91013 Allergy to seafood: Secondary | ICD-10-CM | POA: Diagnosis not present

## 2015-12-05 DIAGNOSIS — G894 Chronic pain syndrome: Secondary | ICD-10-CM | POA: Diagnosis not present

## 2015-12-05 DIAGNOSIS — T8131XA Disruption of external operation (surgical) wound, not elsewhere classified, initial encounter: Secondary | ICD-10-CM | POA: Diagnosis not present

## 2015-12-05 DIAGNOSIS — I5042 Chronic combined systolic (congestive) and diastolic (congestive) heart failure: Secondary | ICD-10-CM | POA: Diagnosis not present

## 2015-12-05 DIAGNOSIS — Z452 Encounter for adjustment and management of vascular access device: Secondary | ICD-10-CM | POA: Diagnosis not present

## 2015-12-05 LAB — URINALYSIS, ROUTINE W REFLEX MICROSCOPIC
GLUCOSE, UA: NEGATIVE mg/dL
HGB URINE DIPSTICK: NEGATIVE
KETONES UR: 15 mg/dL — AB
Nitrite: NEGATIVE
PROTEIN: 30 mg/dL — AB
Specific Gravity, Urine: 1.031 — ABNORMAL HIGH (ref 1.005–1.030)
pH: 6 (ref 5.0–8.0)

## 2015-12-05 LAB — GLUCOSE, CAPILLARY
GLUCOSE-CAPILLARY: 101 mg/dL — AB (ref 65–99)
GLUCOSE-CAPILLARY: 96 mg/dL (ref 65–99)
Glucose-Capillary: 105 mg/dL — ABNORMAL HIGH (ref 65–99)
Glucose-Capillary: 115 mg/dL — ABNORMAL HIGH (ref 65–99)
Glucose-Capillary: 92 mg/dL (ref 65–99)
Glucose-Capillary: 93 mg/dL (ref 65–99)

## 2015-12-05 LAB — VITAMIN B12: VITAMIN B 12: 790 pg/mL (ref 180–914)

## 2015-12-05 LAB — TSH: TSH: 2.036 u[IU]/mL (ref 0.350–4.500)

## 2015-12-05 LAB — URINE MICROSCOPIC-ADD ON: RBC / HPF: NONE SEEN RBC/hpf (ref 0–5)

## 2015-12-05 LAB — COMPREHENSIVE METABOLIC PANEL
ALT: 9 U/L — ABNORMAL LOW (ref 14–54)
ANION GAP: 11 (ref 5–15)
AST: 17 U/L (ref 15–41)
Albumin: 2.5 g/dL — ABNORMAL LOW (ref 3.5–5.0)
Alkaline Phosphatase: 66 U/L (ref 38–126)
BILIRUBIN TOTAL: 0.3 mg/dL (ref 0.3–1.2)
BUN: <5 mg/dL — ABNORMAL LOW (ref 6–20)
CALCIUM: 8.2 mg/dL — AB (ref 8.9–10.3)
CO2: 24 mmol/L (ref 22–32)
Chloride: 109 mmol/L (ref 101–111)
Creatinine, Ser: 0.83 mg/dL (ref 0.44–1.00)
GFR calc Af Amer: >60 mL/min (ref >60)
Glucose, Bld: 102 mg/dL — ABNORMAL HIGH (ref 65–99)
POTASSIUM: 3.5 mmol/L (ref 3.5–5.1)
Sodium: 144 mmol/L (ref 135–145)
TOTAL PROTEIN: 6.5 g/dL (ref 6.5–8.1)

## 2015-12-05 LAB — LIPID PANEL
CHOLESTEROL: 138 mg/dL (ref 0–200)
HDL: 39 mg/dL — AB (ref >40)
LDL CALC: 88 mg/dL (ref 0–99)
TRIGLYCERIDES: 56 mg/dL (ref <150)
Total CHOL/HDL Ratio: 3.5 RATIO
VLDL: 11 mg/dL (ref 0–40)

## 2015-12-05 LAB — CBC
HCT: 32.3 % — ABNORMAL LOW (ref 36.0–46.0)
Hemoglobin: 10 g/dL — ABNORMAL LOW (ref 12.0–15.0)
MCH: 26.6 pg (ref 26.0–34.0)
MCHC: 31 g/dL (ref 30.0–36.0)
MCV: 85.9 fL (ref 78.0–100.0)
Platelets: 287 10*3/uL (ref 150–400)
RBC: 3.76 MIL/uL — ABNORMAL LOW (ref 3.87–5.11)
RDW: 14.2 % (ref 11.5–15.5)
WBC: 4.2 10*3/uL (ref 4.0–10.5)

## 2015-12-05 LAB — IRON AND TIBC
Iron: 37 ug/dL (ref 28–170)
Saturation Ratios: 22 % (ref 10.4–31.8)
TIBC: 168 ug/dL — AB (ref 250–450)
UIBC: 131 ug/dL

## 2015-12-05 LAB — RAPID URINE DRUG SCREEN, HOSP PERFORMED
Amphetamines: NOT DETECTED
Barbiturates: NOT DETECTED
Benzodiazepines: POSITIVE — AB
COCAINE: NOT DETECTED
OPIATES: POSITIVE — AB
Tetrahydrocannabinol: NOT DETECTED

## 2015-12-05 LAB — RETICULOCYTES
RBC.: 3.76 MIL/uL — AB (ref 3.87–5.11)
RETIC COUNT ABSOLUTE: 67.7 10*3/uL (ref 19.0–186.0)
RETIC CT PCT: 1.8 % (ref 0.4–3.1)

## 2015-12-05 LAB — TROPONIN I: Troponin I: <0.03 ng/mL (ref <0.031)

## 2015-12-05 LAB — MAGNESIUM: MAGNESIUM: 1.7 mg/dL (ref 1.7–2.4)

## 2015-12-05 LAB — FERRITIN: Ferritin: 86 ng/mL (ref 11–307)

## 2015-12-05 LAB — FOLATE: Folate: 10.1 ng/mL (ref >5.9)

## 2015-12-05 LAB — PHOSPHORUS: PHOSPHORUS: 3.2 mg/dL (ref 2.5–4.6)

## 2015-12-05 MED ORDER — METOPROLOL TARTRATE 1 MG/ML IV SOLN
5.0000 mg | Freq: Four times a day (QID) | INTRAVENOUS | Status: DC
  Administered 2015-12-05 – 2015-12-06 (×4): 5 mg via INTRAVENOUS
  Filled 2015-12-05 (×5): qty 5

## 2015-12-05 MED ORDER — COLLAGENASE 250 UNIT/GM EX OINT
TOPICAL_OINTMENT | Freq: Every day | CUTANEOUS | Status: DC
  Administered 2015-12-05 – 2015-12-08 (×4): via TOPICAL
  Filled 2015-12-05 (×2): qty 30

## 2015-12-05 MED ORDER — SODIUM CHLORIDE 0.9% FLUSH
10.0000 mL | INTRAVENOUS | Status: DC | PRN
  Administered 2015-12-06: 10 mL
  Filled 2015-12-05: qty 40

## 2015-12-05 MED ORDER — SODIUM CHLORIDE 0.9 % IV SOLN
INTRAVENOUS | Status: DC
  Administered 2015-12-05 – 2015-12-08 (×5): via INTRAVENOUS

## 2015-12-05 MED ORDER — MORPHINE SULFATE (PF) 2 MG/ML IV SOLN
2.0000 mg | INTRAVENOUS | Status: DC | PRN
  Administered 2015-12-05 – 2015-12-08 (×7): 2 mg via INTRAVENOUS
  Filled 2015-12-05 (×7): qty 1

## 2015-12-05 MED ORDER — METOCLOPRAMIDE HCL 5 MG/ML IJ SOLN
5.0000 mg | Freq: Once | INTRAMUSCULAR | Status: AC
  Administered 2015-12-05: 5 mg via INTRAVENOUS
  Filled 2015-12-05: qty 2

## 2015-12-05 MED ORDER — PANTOPRAZOLE SODIUM 40 MG IV SOLR
40.0000 mg | INTRAVENOUS | Status: DC
  Administered 2015-12-05 – 2015-12-08 (×4): 40 mg via INTRAVENOUS
  Filled 2015-12-05 (×4): qty 40

## 2015-12-05 MED ORDER — HYDRALAZINE HCL 20 MG/ML IJ SOLN: 10.0000 mg | INTRAMUSCULAR | Status: DC | PRN

## 2015-12-05 NOTE — Progress Notes (Signed)
PROGRESS NOTE    [name]  [id number] DOB: 20-Jun-1951 DOA: 12/04/2015 PCP: Philis Fendt, MD Outpatient Specialists:    Brief [id number] who presented with one-week history of intermittent right lower quadrant abdominal pain nausea vomiting small episode of diarrhea. She was seen for this in emergency department on the 4/15, found to have large amount of colonic stool. On return, patient noted to have less stool burden on abd xray. Patient was admitted for continued n/v/d   Assessment & Plan:   Active Problems:   Essential hypertension   Elevated lipase   Intractable nausea and vomiting   Obstipation   Acute combined systolic and diastolic CHF, NYHA class 2 (HCC)   Nausea and vomiting  . Intractable nausea and vomiting- patient noted to have large stool on CT scan 2 prior to admission. Recent KUB showing decreased stool burden. Etiology of nausea vomiting could be possibly secondary to constipation versus gastroenteritis, GI panel pending. This AM, pt continues with intractable nausea and is unable to tolerate PO. Will keep NPO for now. Check urine drug screen (cannibis hyperemesis?) . Essential hypertension stable continue home medications  . Elevated lipase has been coming down CT scan of abdomen showed no evidence of pancreatitis  . Obstipation currently appears to be resolving continue to monitor  . chronic combined systolic and diastolic CHF, NYHA class 2 ([place]) - currently does not appear to be fluid overloaded. Chronic continue home medications   DVT prophylaxis: Lovenox Code Status: Full Family Communication: Patient in room Disposition Plan: Uncertain at this time   Consultants:     Procedures:    Antimicrobials:       Subjective: Complains of continued nausea. Unable to tolerate clear diet  Objective: Filed Vitals:   12/05/15 0536 12/05/15 0746 12/05/15 0801 12/05/15 1125  BP: 147/84 145/122 163/86 177/97  Pulse: 85 86  89  Temp:  97.7 F (36.5 C) 98.2 F (36.8 C)  98.2 F (36.8 C)  TempSrc: Oral Oral  Oral  Resp:  22  22  Height:      Weight: 113.49 kg (250 lb 3.2 oz)     SpO2: 95% 97%  90%    Intake/Output Summary (Last 24 hours) at 12/05/15 1208 Last data filed at 12/05/15 1010  Gross per 24 hour  Intake      0 ml  Output      0 ml  Net      0 ml   Filed Weights   12/04/15 2325 12/05/15 0536  Weight: 113.445 kg (250 lb 1.6 oz) 113.49 kg (250 lb 3.2 oz)    Examination:  General exam: Appears calm and comfortable  Respiratory system: Clear to auscultation. Respiratory effort normal. Cardiovascular system: S1 & S2 heard, regular. Gastrointestinal system: decreased BS, obese Central nervous system: Alert and oriented. No focal neurological deficits. Extremities: Symmetric 5 x 5 power. Skin: No rashes, lesions or ulcers Psychiatry: Judgement and insight appear normal. Mood & affect appropriate.     Data Reviewed: I have personally reviewed following labs and imaging studies  CBC:  Recent Labs Lab 12/02/15 0410 12/04/15 1740 12/05/15 0336  WBC 3.9* 3.6* 4.2  NEUTROABS 1.8 1.4*  --   HGB 10.2* 11.1* 10.0*  HCT 32.3* 34.7* 32.3*  MCV 84.1 85.5 85.9  PLT 318 330 [id number]   Basic Metabolic Panel:  Recent Labs Lab 12/02/15 0410 12/04/15 1740 12/05/15 0336  NA 141 144 144  K 3.4* 3.5 3.5  CL 108 110 109  CO2 19* 21* 24  GLUCOSE 113* 116* 102*  BUN <5* <5* <5*  CREATININE 0.72 0.90 0.83  CALCIUM 8.7* 8.6* 8.2*  MG  --   --  1.7  PHOS  --   --  3.2   GFR: Estimated Creatinine Clearance: 80.8 mL/min (by C-G formula based on Cr of 0.83). Liver Function Tests:  Recent Labs Lab 12/02/15 0410 12/04/15 1740 12/05/15 0336  AST 20 21 17   ALT 11* 11* 9*  ALKPHOS 70 72 66  BILITOT 0.4 0.5 0.3  PROT 6.9 6.8 6.5  ALBUMIN 2.6* 2.7* 2.5*    Recent Labs Lab 12/02/15 0410 12/04/15 1740  LIPASE 63* 52*   No results for input(s): AMMONIA in the last 168 hours. Coagulation  Profile: No results for input(s): INR, PROTIME in the last 168 hours. Cardiac Enzymes:  Recent Labs Lab 12/04/15 2215 12/05/15 0336 12/05/15 1010  TROPONINI <0.03 <0.03 <0.03   BNP (last 3 results) No results for input(s): PROBNP in the last 8760 hours. HbA1C: No results for input(s): HGBA1C in the last 72 hours. CBG:  Recent Labs Lab 12/04/15 2337 12/05/15 0408 12/05/15 0744 12/05/15 1123  GLUCAP 105* 93 101* 115*   Lipid Profile:  Recent Labs  12/05/15 0348  CHOL 138  HDL 39*  LDLCALC 88  TRIG 56  CHOLHDL 3.5   Thyroid Function Tests:  Recent Labs  12/05/15 0348  TSH 2.036   Anemia Panel:  Recent Labs  12/05/15 0336 12/05/15 0348  VITAMINB12 790  --   FOLATE  --  10.1  FERRITIN 86  --   TIBC 168*  --   IRON 37  --   RETICCTPCT 1.8  --    Urine analysis:    Component Value Date/Time   COLORURINE AMBER* 12/02/2015 0622   APPEARANCEUR CLEAR 12/02/2015 0622   LABSPEC 1.026 12/02/2015 0622   PHURINE 7.0 12/02/2015 0622   GLUCOSEU NEGATIVE 12/02/2015 0622   HGBUR NEGATIVE 12/02/2015 0622   BILIRUBINUR SMALL* 12/02/2015 0622   KETONESUR 40* 12/02/2015 0622   PROTEINUR 30* 12/02/2015 0622   UROBILINOGEN 0.2 03/18/2015 1001   NITRITE NEGATIVE 12/02/2015 0622   LEUKOCYTESUR TRACE* 12/02/2015 0622   Sepsis Labs: @LABRCNTIP (procalcitonin:4,lacticidven:4)  )No results found for this or any previous visit (from the past 240 hour(s)).       Radiology Studies: Dg Abd 1 View  12/04/2015  CLINICAL DATA:  Obstipation.  Nausea and vomiting for 1 week. EXAM: ABDOMEN - 1 VIEW COMPARISON:  CT 2 days prior. FINDINGS: Normal bowel gas pattern without dilated bowel loops. Air throughout normal caliber small and large bowel. Small volume of colonic stool. Tacks in the anterior abdominal wall from prior ventral hernia repair. Surgical hardware in the lumbar spine. IMPRESSION: Normal bowel gas pattern. Electronically Signed   By: Jeb Levering M.D.   On:  12/04/2015 21:44   Dg Chest Port 1 View  12/05/2015  CLINICAL DATA:  Right-sided PICC line placement. EXAM: PORTABLE CHEST 1 VIEW COMPARISON:  10/27/2015 FINDINGS: Interval placement of a right PICC line with tip over the low SVC region. No pneumothorax. Normal heart size and pulmonary vascularity. No focal airspace disease or consolidation in the lungs. No blunting of costophrenic angles. No pneumothorax. Mediastinal contours appear intact. Postoperative right shoulder arthroplasty. IMPRESSION: Right PICC catheter appears in satisfactory position. No pneumothorax. Electronically Signed   By: Lucienne Capers M.D.   On: 12/05/2015 01:55        Scheduled Meds: . cefTRIAXone (ROCEPHIN)  IV  2 g Intravenous Q24H  . [START ON 12/07/2015] cloNIDine  0.3 mg Transdermal Q Thu  . collagenase   Topical Daily  . enoxaparin (LOVENOX) injection  40 mg Subcutaneous Q24H  . insulin aspart  0-9 Units Subcutaneous 6 times per day  . metoCLOPramide (REGLAN) injection  5 mg Intravenous Once  . metoprolol  5 mg Intravenous Q6H  . pantoprazole (PROTONIX) IV  40 mg Intravenous Q24H  . simvastatin  20 mg Oral QPM  . sodium chloride flush  3 mL Intravenous Q12H   Continuous Infusions: . sodium chloride       LOS: 1 day    CHIU, STEPHEN K, MD [facility] Pager [phone]  If 7PM-7AM, please contact night-coverage [email or web address] Password [facility] 12/05/2015, 12:08 PM

## 2015-12-05 NOTE — Consult Note (Addendum)
WOC wound consult note Reason for Consult: surgical wounds lumbar/thorasic Wound type: surgical  Measurement: 4cm x 1cm x 0.5cm proximal 8cm x 1.5cm x 2.5cm distal  Wound bed: proximal wound- 25% fibrinous tissue/75% pink,moist Distal area-75% yellow/fibrinous tissue/ 25% pink, moist Drainage (amount, consistency, odor) moderate, serousanginous Periwound:intact with multiple deep skin folds which makes the area constantly moist.  Dressing procedure/placement/frequency: Will add enzymatic debridement ointment to both wound in order to clear away the slough.  Cover with moist gauze. Change daily. Explained to patient to continue to use once DC back to home and that she really needs to keep follow up appointment with her neurosurgeon as scheduled.   Discussed POC with patient and bedside nurse.  Re consult if needed, will not follow at this time. Thanks  Ansley Mangiapane [place]ogg, [place] [phone])

## 2015-12-05 NOTE — Care Management Obs Status (Signed)
[facility] NOTIFICATION   Patient Details  Name: [name] MRN: [id number] Date of Birth: [birth year]   Medicare Observation Status Notification Given:  Yes    Erenest Rasher, RN 12/05/2015, 4:58 PM

## 2015-12-05 NOTE — Care Management Note (Signed)
Case Management Note  Patient Details  Name: [name] MRN: [id number] Date of Birth: November 08, 1950  Subjective/Objective:    Nausea, vomiting,  Abdominal pain, recent hospitalization 11/25/2015             Action/Plan: Discharge Planning:  NCM spoke to pt and states she is active with [facility] for [facility] RN for IV abx, wound care and PT. States she is staying with dtr, [name]. She has RW, shower chair and 3n1 bedside commode. Will need resumption of care orders for [facility] at dc. NCM will continue to follow for dc needs and arranging HH.   PCP- Nolene Ebbs MD Expected Discharge Date:                  Expected Discharge Plan:  [place]  In-House Referral:  NA  Discharge planning Services  CM Consult  Post Acute Care Choice:  Home Health, Resumption of Svcs/PTA Provider Choice offered to:  Patient  DME Arranged:  N/A DME Agency:  NA  HH Arranged:  RN, PT [place] Agency:  [facility]  Status of Service:  In process, will continue to follow  Medicare Important Message Given:    Date Medicare IM Given:    Medicare IM give by:    Date Additional Medicare IM Given:    Additional Medicare Important Message give by:     If discussed at [place] of Stay Meetings, dates discussed:    Additional Comments:  Erenest Rasher, RN 12/05/2015, 5:04 PM

## 2015-12-06 ENCOUNTER — Inpatient Hospital Stay: Payer: Self-pay | Admitting: Internal Medicine

## 2015-12-06 DIAGNOSIS — I110 Hypertensive heart disease with heart failure: Secondary | ICD-10-CM | POA: Diagnosis not present

## 2015-12-06 DIAGNOSIS — I5042 Chronic combined systolic (congestive) and diastolic (congestive) heart failure: Secondary | ICD-10-CM | POA: Diagnosis not present

## 2015-12-06 DIAGNOSIS — I10 Essential (primary) hypertension: Secondary | ICD-10-CM

## 2015-12-06 DIAGNOSIS — Z981 Arthrodesis status: Secondary | ICD-10-CM | POA: Diagnosis not present

## 2015-12-06 DIAGNOSIS — G43A0 Cyclical vomiting, not intractable: Secondary | ICD-10-CM | POA: Diagnosis not present

## 2015-12-06 DIAGNOSIS — Z6841 Body Mass Index (BMI) 40.0 and over, adult: Secondary | ICD-10-CM | POA: Diagnosis not present

## 2015-12-06 DIAGNOSIS — K5900 Constipation, unspecified: Secondary | ICD-10-CM | POA: Diagnosis not present

## 2015-12-06 DIAGNOSIS — R1031 Right lower quadrant pain: Secondary | ICD-10-CM | POA: Diagnosis not present

## 2015-12-06 DIAGNOSIS — T8131XA Disruption of external operation (surgical) wound, not elsewhere classified, initial encounter: Secondary | ICD-10-CM | POA: Diagnosis not present

## 2015-12-06 LAB — GASTROINTESTINAL PANEL BY PCR, STOOL (REPLACES STOOL CULTURE)
Adenovirus F40/41: NOT DETECTED
Astrovirus: NOT DETECTED
CAMPYLOBACTER SPECIES: NOT DETECTED
CRYPTOSPORIDIUM: NOT DETECTED
Cyclospora cayetanensis: NOT DETECTED
E. coli O157: NOT DETECTED
Entamoeba histolytica: NOT DETECTED
Enteroaggregative E coli (EAEC): NOT DETECTED
Enteropathogenic E coli (EPEC): NOT DETECTED
Enterotoxigenic E coli (ETEC): NOT DETECTED
Giardia lamblia: NOT DETECTED
Norovirus GI/GII: NOT DETECTED
PLESIMONAS SHIGELLOIDES: NOT DETECTED
ROTAVIRUS A: NOT DETECTED
SALMONELLA SPECIES: NOT DETECTED
SHIGA LIKE TOXIN PRODUCING E COLI (STEC): NOT DETECTED
SHIGELLA/ENTEROINVASIVE E COLI (EIEC): NOT DETECTED
Sapovirus (I, II, IV, and V): NOT DETECTED
Vibrio cholerae: NOT DETECTED
Vibrio species: NOT DETECTED
YERSINIA ENTEROCOLITICA: NOT DETECTED

## 2015-12-06 LAB — CBC
HEMATOCRIT: 32 % — AB (ref 36.0–46.0)
Hemoglobin: 9.9 g/dL — ABNORMAL LOW (ref 12.0–15.0)
MCH: 26.8 pg (ref 26.0–34.0)
MCHC: 30.9 g/dL (ref 30.0–36.0)
MCV: 86.5 fL (ref 78.0–100.0)
PLATELETS: 293 10*3/uL (ref 150–400)
RBC: 3.7 MIL/uL — ABNORMAL LOW (ref 3.87–5.11)
RDW: 14.5 % (ref 11.5–15.5)
WBC: 4.4 10*3/uL (ref 4.0–10.5)

## 2015-12-06 LAB — GLUCOSE, CAPILLARY
GLUCOSE-CAPILLARY: 109 mg/dL — AB (ref 65–99)
GLUCOSE-CAPILLARY: 92 mg/dL (ref 65–99)
GLUCOSE-CAPILLARY: 95 mg/dL (ref 65–99)
Glucose-Capillary: 78 mg/dL (ref 65–99)
Glucose-Capillary: 81 mg/dL (ref 65–99)
Glucose-Capillary: 85 mg/dL (ref 65–99)
Glucose-Capillary: 87 mg/dL (ref 65–99)

## 2015-12-06 LAB — BASIC METABOLIC PANEL
Anion gap: 12 (ref 5–15)
CALCIUM: 8.1 mg/dL — AB (ref 8.9–10.3)
CO2: 21 mmol/L — ABNORMAL LOW (ref 22–32)
CREATININE: 0.78 mg/dL (ref 0.44–1.00)
Chloride: 111 mmol/L (ref 101–111)
GFR calc Af Amer: >60 mL/min (ref >60)
GLUCOSE: 81 mg/dL (ref 65–99)
POTASSIUM: 3.5 mmol/L (ref 3.5–5.1)
SODIUM: 144 mmol/L (ref 135–145)

## 2015-12-06 LAB — HEMOGLOBIN A1C
Hgb A1c MFr Bld: 6.2 % — ABNORMAL HIGH (ref 4.8–5.6)
MEAN PLASMA GLUCOSE: 131 mg/dL

## 2015-12-06 MED ORDER — METHOCARBAMOL 500 MG PO TABS
750.0000 mg | ORAL_TABLET | Freq: Three times a day (TID) | ORAL | Status: DC | PRN
  Administered 2015-12-06 – 2015-12-07 (×2): 750 mg via ORAL
  Filled 2015-12-06 (×2): qty 2

## 2015-12-06 MED ORDER — METOCLOPRAMIDE HCL 5 MG/ML IJ SOLN: 5.0000 mg | Freq: Four times a day (QID) | INTRAMUSCULAR | Status: DC

## 2015-12-06 MED ORDER — OXYCODONE-ACETAMINOPHEN 5-325 MG PO TABS
1.0000 | ORAL_TABLET | ORAL | Status: DC | PRN
  Administered 2015-12-08: 1 via ORAL
  Filled 2015-12-06: qty 1

## 2015-12-06 MED ORDER — PROMETHAZINE HCL 25 MG PO TABS
25.0000 mg | ORAL_TABLET | Freq: Four times a day (QID) | ORAL | Status: DC | PRN
  Administered 2015-12-06 – 2015-12-07 (×2): 25 mg via ORAL
  Filled 2015-12-06 (×2): qty 1

## 2015-12-06 MED ORDER — GABAPENTIN 300 MG PO CAPS
300.0000 mg | ORAL_CAPSULE | Freq: Three times a day (TID) | ORAL | Status: DC
  Administered 2015-12-06 – 2015-12-07 (×3): 300 mg via ORAL
  Filled 2015-12-06 (×3): qty 1

## 2015-12-06 MED ORDER — METOPROLOL TARTRATE 12.5 MG HALF TABLET
12.5000 mg | ORAL_TABLET | Freq: Two times a day (BID) | ORAL | Status: DC
  Administered 2015-12-06 – 2015-12-08 (×5): 12.5 mg via ORAL
  Filled 2015-12-06 (×5): qty 1

## 2015-12-06 MED ORDER — ALTEPLASE 2 MG IJ SOLR
2.0000 mg | Freq: Once | INTRAMUSCULAR | Status: AC
  Administered 2015-12-06: 2 mg

## 2015-12-06 NOTE — Progress Notes (Signed)
PROGRESS NOTE    [name]  [id number] DOB: [birth year] DOA: 12/04/2015 PCP: Philis Fendt, MD   Outpatient Specialists: Neurosurgery   Brief Narrative:  [phone] who presented with one-week history of intermittent right lower quadrant abdominal pain nausea vomiting small episode of diarrhea. She was seen for this in emergency department on the 4/15, found to have large amount of colonic stool. On return, patient noted to have less stool burden on abd xray. Patient was admitted for continued n/v/d.   Assessment & Plan:   Active Problems:   Essential hypertension   Elevated lipase   Intractable nausea and vomiting   Obstipation   Acute combined systolic and diastolic CHF, NYHA class 2 (HCC)   Nausea and vomiting  . Intractable nausea and vomiting- patient noted to have large stool on CT scan 2 prior to admission. Recent KUB showing decreased stool burden. Etiology of nausea vomiting could be possibly secondary to constipation versus gastroenteritis, GI panel pending. Cannot give Zofran due to Qtc prolongation, will start with phenergan 25 mg po q 6 hr prn.  . Essential hypertension  stable continue home medications   . Elevated lipase  has been coming down CT scan of abdomen showed no evidence of pancreatitis   . Chronic combined systolic and diastolic CHF,  NYHA class 2 ([place]) - currently does not appear to be fluid overloaded. Chronic continue home medications  Lumbar wound infection Patient was discharged by neurosurgery on 11/09/15 after I and D of lumbar wound infection.She was discharged on ceftriaxone for three weeks. This morning she was evaluated by neurosurgeon Dr Ronnald Ramp, who recommended wound vac, continue with antibiotics.    DVT prophylaxis: Lovenox Code Status: Full Family Communication: No family at bedside Disposition Plan: likely in next 24 hrs   Consultants:   Neurosurgery  Procedures:  None   Antimicrobials:    Ceftriaxone     Subjective: Patient denies any complaints.  Objective: Filed Vitals:   12/05/15 1125 12/05/15 2031 12/06/15 0500 12/06/15 1230  BP: 177/97 165/88 169/87 169/87  Pulse: 89 81 86 83  Temp: 98.2 F (36.8 C) 98.6 F (37 C) 98.2 F (36.8 C) 97.8 F (36.6 C)  TempSrc: Oral Oral Oral Oral  Resp: 22  20 20   Height:      Weight:   112.3 kg (247 lb 9.2 oz)   SpO2: 90% 97% 95% 99%    Intake/Output Summary (Last 24 hours) at 12/06/15 1329 Last data filed at 12/06/15 0700  Gross per 24 hour  Intake 1418.75 ml  Output      0 ml  Net 1418.75 ml   Filed Weights   12/04/15 2325 12/05/15 0536 12/06/15 0500  Weight: 113.445 kg (250 lb 1.6 oz) 113.49 kg (250 lb 3.2 oz) 112.3 kg (247 lb 9.2 oz)    Examination:  General exam: Appears calm and comfortable  Respiratory system: Clear to auscultation. Respiratory effort normal. Cardiovascular system: S1 & S2 heard, regular. Gastrointestinal system: decreased BS, obese Central nervous system: Alert and oriented. No focal neurological deficits. Extremities: Symmetric 5 x 5 power. Skin: No rashes, lesions or ulcers Psychiatry: Judgement and insight appear normal. Mood & affect appropriate.     Data Reviewed: I have personally reviewed following labs and imaging studies  CBC:  Recent Labs Lab 12/02/15 0410 12/04/15 1740 12/05/15 0336 12/06/15 0423  WBC 3.9* 3.6* 4.2 4.4  NEUTROABS 1.8 1.4*  --   --   HGB 10.2* 11.1* 10.0* 9.9*  HCT 32.3* 34.7*  32.3* 32.0*  MCV 84.1 85.5 85.9 86.5  PLT 318 330 287 0000000   Basic Metabolic Panel:  Recent Labs Lab 12/02/15 0410 12/04/15 1740 12/05/15 0336 12/06/15 0423  NA 141 144 144 144  K 3.4* 3.5 3.5 3.5  CL 108 110 109 111  CO2 19* 21* 24 21*  GLUCOSE 113* 116* 102* 81  BUN <5* <5* <5* <5*  CREATININE 0.72 0.90 0.83 0.78  CALCIUM 8.7* 8.6* 8.2* 8.1*  MG  --   --  1.7  --   PHOS  --   --  3.2  --    GFR: Estimated Creatinine Clearance: 83.4 mL/min (by C-G formula based on Cr of  0.78). Liver Function Tests:  Recent Labs Lab 12/02/15 0410 12/04/15 1740 12/05/15 0336  AST 20 21 17   ALT 11* 11* 9*  ALKPHOS 70 72 66  BILITOT 0.4 0.5 0.3  PROT 6.9 6.8 6.5  ALBUMIN 2.6* 2.7* 2.5*    Recent Labs Lab 12/02/15 0410 12/04/15 1740  LIPASE 63* 52*   Cardiac Enzymes:  Recent Labs Lab 12/04/15 2215 12/05/15 0336 12/05/15 1010  TROPONINI <0.03 <0.03 <0.03   BNP (last 3 results) HbA1C:  Recent Labs  12/04/15 1740  HGBA1C 6.2*   CBG:  Recent Labs Lab 12/05/15 2004 12/06/15 0037 12/06/15 0455 12/06/15 0809 12/06/15 1253  GLUCAP 96 92 81 87 95   Lipid Profile:  Recent Labs  12/05/15 0348  CHOL 138  HDL 39*  LDLCALC 88  TRIG 56  CHOLHDL 3.5   Thyroid Function Tests:  Recent Labs  12/05/15 0348  TSH 2.036   Anemia Panel:  Recent Labs  12/05/15 0336 12/05/15 0348  VITAMINB12 790  --   FOLATE  --  10.1  FERRITIN 86  --   TIBC 168*  --   IRON 37  --   RETICCTPCT 1.8  --    Urine analysis:    Component Value Date/Time   COLORURINE AMBER* 12/05/2015 1537   APPEARANCEUR CLOUDY* 12/05/2015 1537   LABSPEC 1.031* 12/05/2015 1537   PHURINE 6.0 12/05/2015 1537   GLUCOSEU NEGATIVE 12/05/2015 1537   HGBUR NEGATIVE 12/05/2015 1537   BILIRUBINUR SMALL* 12/05/2015 1537   KETONESUR 15* 12/05/2015 1537   PROTEINUR 30* 12/05/2015 1537   UROBILINOGEN 0.2 03/18/2015 1001   NITRITE NEGATIVE 12/05/2015 1537   LEUKOCYTESUR SMALL* 12/05/2015 1537   Sepsis Labs: @LABRCNTIP (procalcitonin:4,lacticidven:4)  )No results found for this or any previous visit (from the past 240 hour(s)).       Radiology Studies: Dg Abd 1 View  12/04/2015  CLINICAL DATA:  Obstipation.  Nausea and vomiting for 1 week. EXAM: ABDOMEN - 1 VIEW COMPARISON:  CT 2 days prior. FINDINGS: Normal bowel gas pattern without dilated bowel loops. Air throughout normal caliber small and large bowel. Small volume of colonic stool. Tacks in the anterior abdominal wall from  prior ventral hernia repair. Surgical hardware in the lumbar spine. IMPRESSION: Normal bowel gas pattern. Electronically Signed   By: Jeb Levering M.D.   On: 12/04/2015 21:44   Dg Chest Port 1 View  12/05/2015  CLINICAL DATA:  Right-sided PICC line placement. EXAM: PORTABLE CHEST 1 VIEW COMPARISON:  10/27/2015 FINDINGS: Interval placement of a right PICC line with tip over the low SVC region. No pneumothorax. Normal heart size and pulmonary vascularity. No focal airspace disease or consolidation in the lungs. No blunting of costophrenic angles. No pneumothorax. Mediastinal contours appear intact. Postoperative right shoulder arthroplasty. IMPRESSION: Right PICC catheter appears in satisfactory  position. No pneumothorax. Electronically Signed   By: Lucienne Capers M.D.   On: 12/05/2015 01:55        Scheduled Meds: . cefTRIAXone (ROCEPHIN)  IV  2 g Intravenous Q24H  . [START ON 12/07/2015] cloNIDine  0.3 mg Transdermal Q Thu  . collagenase   Topical Daily  . enoxaparin (LOVENOX) injection  40 mg Subcutaneous Q24H  . gabapentin  300 mg Oral TID  . insulin aspart  0-9 Units Subcutaneous 6 times per day  . metoprolol  5 mg Intravenous Q6H  . pantoprazole (PROTONIX) IV  40 mg Intravenous Q24H  . simvastatin  20 mg Oral QPM  . sodium chloride flush  3 mL Intravenous Q12H   Continuous Infusions: . sodium chloride 75 mL/hr at 12/06/15 1043     LOS: 2 days    Oswald Hillock, MD [facility] Pager [phone]  If 7PM-7AM, please contact night-coverage [email or web address] Password [email or web address] 12/06/2015, 1:29 PM

## 2015-12-06 NOTE — Consult Note (Addendum)
WOC wound follow-up consult note Reason for Consult: Neurosurgery team in earlier to assess back wound and has ordered Vac dressing to be applied. Wound type: surgical full thickness Upper back wound is having Santyl ointment applied daily to assist with removal of nonviable tissue. Lower back wound 8cm x 1.5cm x 2.5cm; 20 % yellow slough/ 80 % pink, moist Drainage (amount, consistency, odor) moderate amt yellow drainage Periwound:intact with multiple deep skin folds surrounding the wound which makes the area constantly moist; it will be difficult to maintain a seal for the Vac dressing.  Applied one piece black foam into wound bed, and another bridged to hip for track pad placement.  Applied barrier ring surrounding wound in skin creases to assist with maintaining a seal.  Cont suction on at 130mm.  Pt medicated for pain prior to procedure and tolerated with minimal amt pain.  Plan for dressing change on Friday. Julien Girt MSN, RN, [place], [place], [place]

## 2015-12-06 NOTE — Progress Notes (Signed)
Patient ID: [name], female   DOB: April 07, 1951, 65 y.o.   MRN: [id number] I came by to see the patient and examined her today. She continues to complain of back and leg pain. It seems fairly constant but is worse with activity. Her wound is dehisced once again. I don't see significant drainage. There is fibrinous exudate. I think she is going to need a wound VAC to heal this and I have spoken to the wound care nurses to help with this. I reviewed the CT scan of her abdomen which includes the decompression and fusion and the screws look well placed and the graft looks okay and I see no evidence of discitis or destruction.

## 2015-12-06 NOTE — Care Management Note (Signed)
Case Management Note  Patient Details  Name: [name] MRN: [id number] Date of Birth: [birth year]  Subjective/Objective:    Nausea, vomiting,  Abdominal pain, recent hospitalization 11/25/2015             Action/Plan: Discharge Planning:  NCM spoke to pt and states she is active with [facility] for [facility] RN for IV abx, wound care and PT. States she is staying with dtr, [name]. She has RW, shower chair and 3n1 bedside commode. Will need resumption of care orders for [facility] at dc. NCM will continue to follow for dc needs and arranging HH.   PCP- Nolene Ebbs MD Expected Discharge Date:                  Expected Discharge Plan:  [place]  In-House Referral:  NA  Discharge planning Services  CM Consult  Post Acute Care Choice:  Home Health, Resumption of Svcs/PTA Provider Choice offered to:  Patient  DME Arranged:  N/A DME Agency:  NA  HH Arranged:  RN, PT [place] Agency:  [place]  Status of Service:  In process, will continue to follow  Medicare Important Message Given:    Date Medicare IM Given:    Medicare IM give by:    Date Additional Medicare IM Given:    Additional Medicare Important Message give by:     If discussed at [place] of Stay Meetings, dates discussed:    Additional Comments: CM spoke with surgery Dr Ronnald Ramp; pt getting wound vac placed today, requesting PT/OT evaluation as MD feels that pt will not be safe in the home setting. Pt given choice for both HH and DME; pt chose [facility].   Dr Lama/Dr Ronnald Ramp agreed to [facility] for wound vac supplier - agency accepted referral.  CM contacted IV [facility] nurse and [facility] to inform of admit with pending discharge 12/06/15.  CM will continue to follow.  CM contacted daughter as requested and answered questions regarding inpt/observation status. Maryclare Labrador, RN 12/06/2015, 1:03 PM

## 2015-12-07 DIAGNOSIS — Z981 Arthrodesis status: Secondary | ICD-10-CM | POA: Diagnosis not present

## 2015-12-07 DIAGNOSIS — G894 Chronic pain syndrome: Secondary | ICD-10-CM

## 2015-12-07 DIAGNOSIS — I5041 Acute combined systolic (congestive) and diastolic (congestive) heart failure: Secondary | ICD-10-CM | POA: Diagnosis not present

## 2015-12-07 DIAGNOSIS — I110 Hypertensive heart disease with heart failure: Secondary | ICD-10-CM | POA: Diagnosis not present

## 2015-12-07 DIAGNOSIS — R1031 Right lower quadrant pain: Secondary | ICD-10-CM | POA: Diagnosis not present

## 2015-12-07 DIAGNOSIS — I5042 Chronic combined systolic (congestive) and diastolic (congestive) heart failure: Secondary | ICD-10-CM | POA: Diagnosis not present

## 2015-12-07 DIAGNOSIS — G43A0 Cyclical vomiting, not intractable: Secondary | ICD-10-CM | POA: Diagnosis not present

## 2015-12-07 DIAGNOSIS — I10 Essential (primary) hypertension: Secondary | ICD-10-CM | POA: Diagnosis not present

## 2015-12-07 DIAGNOSIS — Z6841 Body Mass Index (BMI) 40.0 and over, adult: Secondary | ICD-10-CM | POA: Diagnosis not present

## 2015-12-07 DIAGNOSIS — K5900 Constipation, unspecified: Secondary | ICD-10-CM | POA: Diagnosis not present

## 2015-12-07 DIAGNOSIS — T8131XA Disruption of external operation (surgical) wound, not elsewhere classified, initial encounter: Secondary | ICD-10-CM | POA: Diagnosis not present

## 2015-12-07 LAB — GLUCOSE, CAPILLARY
GLUCOSE-CAPILLARY: 87 mg/dL (ref 65–99)
GLUCOSE-CAPILLARY: 121 mg/dL — AB (ref 65–99)
Glucose-Capillary: 90 mg/dL (ref 65–99)
Glucose-Capillary: 113 mg/dL — ABNORMAL HIGH (ref 65–99)
Glucose-Capillary: 84 mg/dL (ref 65–99)

## 2015-12-07 MED ORDER — MORPHINE SULFATE ER 15 MG PO TBCR
15.0000 mg | EXTENDED_RELEASE_TABLET | Freq: Two times a day (BID) | ORAL | Status: DC
  Administered 2015-12-07 – 2015-12-08 (×3): 15 mg via ORAL
  Filled 2015-12-07 (×3): qty 1

## 2015-12-07 MED ORDER — GABAPENTIN 300 MG PO CAPS
600.0000 mg | ORAL_CAPSULE | Freq: Three times a day (TID) | ORAL | Status: DC
  Administered 2015-12-07 – 2015-12-08 (×4): 600 mg via ORAL
  Filled 2015-12-07 (×4): qty 2

## 2015-12-07 NOTE — Care Management Note (Signed)
Case Management Note  Patient Details  Name: [name] MRN: [id number] Date of Birth: 1951/07/24  Subjective/Objective:    Nausea, vomiting,  Abdominal pain, recent hospitalization 11/25/2015             Action/Plan: Discharge Planning:  NCM spoke to pt and states she is active with [facility] for [facility] RN for IV abx, wound care and PT. States she is staying with dtr, [name]. She has RW, shower chair and 3n1 bedside commode. Will need resumption of care orders for [facility] at dc. NCM will continue to follow for dc needs and arranging HH.   PCP- Nolene Ebbs MD Expected Discharge Date:                  Expected Discharge Plan:  [place]  In-House Referral:  NA, Clinical Social Work  Discharge planning Services  CM Consult  Post Acute Care Choice:  Home Health, Resumption of Svcs/PTA Provider Choice offered to:  Patient  DME Arranged:  N/A, Vac DME Agency:  NA, [place]:  RN, PT [facility] Agency:  [place]  Status of Service:  In process, will continue to follow  Medicare Important Message Given:    Date Medicare IM Given:    Medicare IM give by:    Date Additional Medicare IM Given:    Additional Medicare Important Message give by:     If discussed at [place] of Stay Meetings, dates discussed:    Additional Comments: 12/07/2015 Pt evaluated pt and are now recommending SNF.  CSW consulted.  [name] DME liaison made aware  12/06/15 CM spoke with surgery Dr Ronnald Ramp; pt getting wound vac placed today, requesting PT/OT evaluation as MD feels that pt will not be safe in the home setting. Pt given choice for both HH and DME; pt chose [facility].   Dr Lama/Dr Ronnald Ramp agreed to [facility] for wound vac supplier - agency accepted referral.  CM contacted IV [facility] nurse and [facility] to inform of admit with pending discharge 12/06/15.  CM will continue to follow.  CM contacted daughter as requested and answered questions regarding inpt/observation  status. Maryclare Labrador, RN 12/07/2015, 1:52 PM

## 2015-12-07 NOTE — Progress Notes (Signed)
PROGRESS NOTE    [name]  [id number] DOB: 05-18-1951 DOA: 12/04/2015 PCP: Philis Fendt, MD   Outpatient Specialists: Neurosurgery   Brief Narrative:  [phone] who presented with one-week history of intermittent right lower quadrant abdominal pain nausea vomiting small episode of diarrhea. She was seen for this in emergency department on the 4/15, found to have large amount of colonic stool. On return, patient noted to have less stool burden on abd xray. Patient was admitted for continued n/v/d.   Assessment & Plan:   Active Problems:   Essential hypertension   Elevated lipase   Intractable nausea and vomiting   Obstipation   Acute combined systolic and diastolic CHF, NYHA class 2 (HCC)   Nausea and vomiting  . Intractable nausea and vomiting- patient noted to have large stool on CT scan 2 prior to admission. Recent KUB showing decreased stool burden. Etiology of nausea vomiting could be possibly secondary to constipation versus gastroenteritis, GI panel pending. Cannot give Zofran due to Qtc prolongation, will start with phenergan 25 mg po q 6 hr prn.  . Essential hypertension  stable continue home medications   . Elevated lipase  has been coming down CT scan of abdomen showed no evidence of pancreatitis   . Chronic combined systolic and diastolic CHF,  NYHA class 2 ([place]) - currently does not appear to be fluid overloaded. Chronic continue home medications  Lumbar wound infection Patient was discharged by neurosurgery on 11/09/15 after I and D of lumbar wound infection.She was discharged on ceftriaxone for three weeks. I called and discussed with neurosurgeon Dr. Ronnald Ramp, who recommended to continue wound VAC. No antibiotics at this time. Patient has completed 3 weeks of antibiotics on 12/05/2015.  Chronic pain syndrome Patient has chronic back pain, likely neuropathic pain. We'll increase the dose of gabapentin to 600 mg 3 times a day. Will start the patient on  long-acting MS Contin 15 mg every 12 hours, and continue with  short-acting morphine 2 mg every 4 hours when necessary   DVT prophylaxis: Lovenox Code Status: Full Family Communication: No family at bedside Disposition Plan: [facility] nursing facility   Consultants:   Neurosurgery  Procedures:  None   Antimicrobials:    Ceftriaxone    Subjective: Patient continues to have chronic low back pain. Says morphine only helps for the pain for some time, and then it wears off.   Objective: Filed Vitals:   12/06/15 1230 12/06/15 1449 12/06/15 2210 12/07/15 0427  BP: 169/87 167/82 174/92 163/94  Pulse: 83 89 82 87  Temp: 97.8 F (36.6 C) 97.9 F (36.6 C) 99 F (37.2 C) 98.5 F (36.9 C)  TempSrc: Oral Oral Oral Oral  Resp: 20  20 20   Height:      Weight:    113.445 kg (250 lb 1.6 oz)  SpO2: 99%  94% 98%    Intake/Output Summary (Last 24 hours) at 12/07/15 1346 Last data filed at 12/07/15 1049  Gross per 24 hour  Intake   2450 ml  Output    400 ml  Net   2050 ml   Filed Weights   12/05/15 0536 12/06/15 0500 12/07/15 0427  Weight: 113.49 kg (250 lb 3.2 oz) 112.3 kg (247 lb 9.2 oz) 113.445 kg (250 lb 1.6 oz)    Examination:  General exam: Appears calm and comfortable  Respiratory system: Clear to auscultation. Respiratory effort normal. Cardiovascular system: S1 & S2 heard, regular. Gastrointestinal system: decreased BS, obese Central nervous system: Alert and  oriented. No focal neurological deficits. Extremities: Symmetric 5 x 5 power. Skin: No rashes, lesions or ulcers Psychiatry: Judgement and insight appear normal. Mood & affect appropriate.     Data Reviewed: I have personally reviewed following labs and imaging studies  CBC:  Recent Labs Lab 12/02/15 0410 12/04/15 1740 12/05/15 0336 12/06/15 0423  WBC 3.9* 3.6* 4.2 4.4  NEUTROABS 1.8 1.4*  --   --   HGB 10.2* 11.1* 10.0* 9.9*  HCT 32.3* 34.7* 32.3* 32.0*  MCV 84.1 85.5 85.9 86.5  PLT 318 330 287  0000000   Basic Metabolic Panel:  Recent Labs Lab 12/02/15 0410 12/04/15 1740 12/05/15 0336 12/06/15 0423  NA 141 144 144 144  K 3.4* 3.5 3.5 3.5  CL 108 110 109 111  CO2 19* 21* 24 21*  GLUCOSE 113* 116* 102* 81  BUN <5* <5* <5* <5*  CREATININE 0.72 0.90 0.83 0.78  CALCIUM 8.7* 8.6* 8.2* 8.1*  MG  --   --  1.7  --   PHOS  --   --  3.2  --    GFR: Estimated Creatinine Clearance: 83.9 mL/min (by C-G formula based on Cr of 0.78). Liver Function Tests:  Recent Labs Lab 12/02/15 0410 12/04/15 1740 12/05/15 0336  AST 20 21 17   ALT 11* 11* 9*  ALKPHOS 70 72 66  BILITOT 0.4 0.5 0.3  PROT 6.9 6.8 6.5  ALBUMIN 2.6* 2.7* 2.5*    Recent Labs Lab 12/02/15 0410 12/04/15 1740  LIPASE 63* 52*   Cardiac Enzymes:  Recent Labs Lab 12/04/15 2215 12/05/15 0336 12/05/15 1010  TROPONINI <0.03 <0.03 <0.03   BNP (last 3 results) HbA1C:  Recent Labs  12/04/15 1740  HGBA1C 6.2*   CBG:  Recent Labs Lab 12/06/15 2020 12/06/15 2357 12/07/15 0412 12/07/15 0758 12/07/15 1118  GLUCAP 85 78 87 90 113*   Lipid Profile:  Recent Labs  12/05/15 0348  CHOL 138  HDL 39*  LDLCALC 88  TRIG 56  CHOLHDL 3.5   Thyroid Function Tests:  Recent Labs  12/05/15 0348  TSH 2.036   Anemia Panel:  Recent Labs  12/05/15 0336 12/05/15 0348  VITAMINB12 790  --   FOLATE  --  10.1  FERRITIN 86  --   TIBC 168*  --   IRON 37  --   RETICCTPCT 1.8  --    Urine analysis:    Component Value Date/Time   COLORURINE AMBER* 12/05/2015 1537   APPEARANCEUR CLOUDY* 12/05/2015 1537   LABSPEC 1.031* 12/05/2015 1537   PHURINE 6.0 12/05/2015 1537   GLUCOSEU NEGATIVE 12/05/2015 1537   HGBUR NEGATIVE 12/05/2015 1537   BILIRUBINUR SMALL* 12/05/2015 1537   KETONESUR 15* 12/05/2015 1537   PROTEINUR 30* 12/05/2015 1537   UROBILINOGEN 0.2 03/18/2015 1001   NITRITE NEGATIVE 12/05/2015 1537   LEUKOCYTESUR SMALL* 12/05/2015 1537   Sepsis  Labs: @LABRCNTIP (procalcitonin:4,lacticidven:4)  ) Recent Results (from the past 240 hour(s))  Gastrointestinal Panel by PCR , Stool     Status: None   Collection Time: 12/05/15  8:25 PM  Result Value Ref Range Status   Campylobacter species NOT DETECTED NOT DETECTED Final   Plesimonas shigelloides NOT DETECTED NOT DETECTED Final   Salmonella species NOT DETECTED NOT DETECTED Final   Yersinia enterocolitica NOT DETECTED NOT DETECTED Final   Vibrio species NOT DETECTED NOT DETECTED Final   Vibrio cholerae NOT DETECTED NOT DETECTED Final   Enteroaggregative E coli (EAEC) NOT DETECTED NOT DETECTED Final   Enteropathogenic E coli (EPEC)  NOT DETECTED NOT DETECTED Final   Enterotoxigenic E coli (ETEC) NOT DETECTED NOT DETECTED Final   Shiga like toxin producing E coli (STEC) NOT DETECTED NOT DETECTED Final   E. coli O157 NOT DETECTED NOT DETECTED Final   Shigella/Enteroinvasive E coli (EIEC) NOT DETECTED NOT DETECTED Final   Cryptosporidium NOT DETECTED NOT DETECTED Final   Cyclospora cayetanensis NOT DETECTED NOT DETECTED Final   Entamoeba histolytica NOT DETECTED NOT DETECTED Final   Giardia lamblia NOT DETECTED NOT DETECTED Final   Adenovirus F40/41 NOT DETECTED NOT DETECTED Final   Astrovirus NOT DETECTED NOT DETECTED Final   Norovirus GI/GII NOT DETECTED NOT DETECTED Final   Rotavirus A NOT DETECTED NOT DETECTED Final   Sapovirus (I, II, IV, and V) NOT DETECTED NOT DETECTED Final         Radiology Studies: No results found.      Scheduled Meds: . cefTRIAXone (ROCEPHIN)  IV  2 g Intravenous Q24H  . cloNIDine  0.3 mg Transdermal Q Thu  . collagenase   Topical Daily  . enoxaparin (LOVENOX) injection  40 mg Subcutaneous Q24H  . gabapentin  600 mg Oral TID  . insulin aspart  0-9 Units Subcutaneous 6 times per day  . metoprolol tartrate  12.5 mg Oral BID  . morphine  15 mg Oral Q12H  . pantoprazole (PROTONIX) IV  40 mg Intravenous Q24H  . simvastatin  20 mg Oral QPM  .  sodium chloride flush  3 mL Intravenous Q12H   Continuous Infusions: . sodium chloride 75 mL/hr at 12/06/15 2243     LOS: 3 days    Oswald Hillock, MD [facility] Pager [phone]  If 7PM-7AM, please contact night-coverage [email or web address] Password [email or web address] 12/07/2015, 1:46 PM

## 2015-12-07 NOTE — Clinical Social Work Note (Signed)
Clinical Social Work Assessment  Patient Details  Name: [name] MRN: [id number] Date of Birth: 03/24/1951  Date of referral:  12/07/15               Reason for consult:  Facility Placement, Discharge Planning                Permission sought to share information with:  Family Supports Permission granted to share information::     Name::        Agency::   (SNF)  Relationship::     Contact Information:     Housing/Transportation Living arrangements for the past 2 months:  Single Family Home Source of Information:  Patient Patient Interpreter Needed:  None Criminal Activity/Legal Involvement Pertinent to Current Situation/Hospitalization:  No - Comment as needed Significant Relationships:  Adult Children Lives with:  Adult Children Do you feel safe going back to the place where you live?  No Need for family participation in patient care:  Yes (Comment)  Care giving concerns:   Patient has not expressed any care giving concerns at this time.   Social Worker assessment / plan:   Patient a/o x4. BSW intern has spoken with patient at bedside, to discuss discharge disposition. BSW intern presented patient with SNF list as well as thoroughly explained insurance coverage. Patient was agreeable to PT recommendation. Patient lives home with daughter, therefore supervision will be provided once d/c from SNF. Patient has requested to have a wheelchair ordered if possible. BSW intern to make [facility] aware. Patient has expressed interest in [facility] and [facility]. BSW intern to fax patient clinical to SNF for review. BSW intern to f/u with patient in reference to extended bed offers. BSW intern remains available.   Employment status:  Retired Forensic scientist:  [employer] PT Recommendations:  [place] / Referral to community resources:  [place]  Patient/Family's Response to care Patient appreciated Social Work  intervention from [employer].  Patient/Family's Understanding of and Emotional Response to Diagnosis, Current Treatment, and Prognosis:   Patient understands need for further rehab at SNF.   Emotional Assessment Appearance:  Appears older than stated age Attitude/Demeanor/Rapport:   (pleasant) Affect (typically observed):  Accepting, Appropriate Orientation:  Oriented to Place, Oriented to  Time, Oriented to Situation, Oriented to Self Alcohol / Substance use:  Not Applicable Psych involvement (Current and /or in the community):  No (Comment)  Discharge Needs  Concerns to be addressed:  No discharge needs identified Readmission within the last 30 days:  No Current discharge risk:  None Barriers to Discharge:  No Barriers Identified   Leane Call, Student-SW 12/07/2015, 3:00 PM

## 2015-12-07 NOTE — Clinical Social Work Placement (Signed)
   CLINICAL SOCIAL WORK PLACEMENT  NOTE  Date:  12/07/2015  Patient Details  Name: [name] MRN: [id number] Date of Birth: 08-Dec-1950  Clinical Social Work is seeking post-discharge placement for this patient at the [place] level of care (*CSW will initial, date and re-position this form in  chart as items are completed):  Yes   Patient/family provided with [facility]'s list of facilities offering this level of care within the geographic area requested by the patient (or if unable, by the patient's family).  Yes   Patient/family informed of their freedom to choose among providers that offer the needed level of care, that participate in Medicare, Medicaid or managed care program needed by the patient, have an available bed and are willing to accept the patient.  Yes   Patient/family informed of [facility]'s ownership interest in [facility] and [facility], as well as of the fact that they are under no obligation to receive care at these facilities.  PASRR submitted to EDS on       PASRR number received on       Existing PASRR number confirmed on       FL2 transmitted to all facilities in geographic area requested by pt/family on 12/07/15     FL2 transmitted to all facilities within larger geographic area on       Patient informed that his/her managed care company has contracts with or will negotiate with certain facilities, including the following:            Patient/family informed of bed offers received.  Patient chooses bed at       Physician recommends and patient chooses bed at      Patient to be transferred to   on  .  Patient to be transferred to facility by       Patient family notified on   of transfer.  Name of family member notified:        PHYSICIAN Please sign FL2     Additional Comment:    _______________________________________________ Leane Call, Student-SW 12/07/2015, 2:08 PM

## 2015-12-07 NOTE — Care Management Important Message (Signed)
Important Message  Patient Details  Name: [name] MRN: [id number] Date of Birth: March 01, 1951   Medicare Important Message Given:  Yes    Ethelene Closser P Zarie Kosiba 12/07/2015, 4:12 PM

## 2015-12-07 NOTE — Consult Note (Signed)
WOC follow-up: Vac intact with good seal to 139mm cont suction. WOC will change back dressing on Friday if pt is still in the hospital at that time. Julien Girt MSN, RN, [place], [place], [place]

## 2015-12-07 NOTE — Evaluation (Signed)
Physical Therapy Evaluation Patient Details Name: [name] MRN: [id number] DOB: Dec 29, 1950 Today's Date: 12/07/2015   History of Present Illness  65 yo who presented with one-week history of intermittent right lower quadrant abdominal pain nausea vomiting small episode of diarrhea. Pt s/p wound vac placement to thoracic wound due to dehiscence (new this admission). Past surgical history: Pt s/p lumbar wound debridement on 11/02/15, lumbar fusion and laminectomy/decompression microdiscectomy on 10/25/15. PMH: CHF, HTN, arthritis, anxiety, GERD, IBS    Clinical Impression  Pt admitted with above diagnosis. Pt currently with functional limitations due to the deficits listed below (see PT Problem List). Patient generally weak and unsteady requiring min assist for transfers and ambulation and max assist for bed mobility. She states daughter works and cannot provide 24/7 care.  Pt will benefit from skilled PT to increase their independence and safety with mobility to allow discharge to the venue listed below.       Follow Up Recommendations SNF;Supervision/Assistance - 24 hour    Equipment Recommendations  None recommended by PT    Recommendations for Other Services       Precautions / Restrictions Precautions Precautions: Fall;Back Precaution Comments: Pt able to independently verbalize "no bending, no arching, no twisting" Twice required vc to avoid bending while seated (having abd cramps and bending forward holding her abd/propping on elbows) Restrictions Weight Bearing Restrictions: No      Mobility  Bed Mobility            General bed mobility comments: see OT note  Transfers Overall transfer level: Needs assistance Equipment used: Rolling walker (2 wheeled) Transfers: Sit to/from Stand Sit to Stand: Min guard         General transfer comment: no physical assist; minguard for safety due to "feeling weak"  Ambulation/Gait Ambulation/Gait assistance: Min  assist Ambulation Distance (Feet): 100 Feet Assistive device: Rolling walker (2 wheeled) Gait Pattern/deviations: Step-through pattern;Decreased stride length;Drifts right/left (single stagger step backwards with min assist recovery) Gait velocity: decr Gait velocity interpretation: Below normal speed for age/gender General Gait Details: pt moaning throughout; able to turn head and maintain balance multiple times (stagger step x 1)  Stairs            Wheelchair Mobility    Modified Rankin (Stroke Patients Only)       Balance Overall balance assessment: Needs assistance Sitting-balance support: No upper extremity supported;Feet supported       Standing balance support: No upper extremity supported Standing balance-Leahy Scale: Fair Standing balance comment: Pt standing at sink for grooming tasks standing at sink                             Pertinent Vitals/Pain Pain Assessment: 0-10 Pain Score: 6  Pain Location: buttocks and abd Pain Descriptors / Indicators: Cramping;Constant;Grimacing;Guarding;Moaning Pain Intervention(s): Limited activity within patient's tolerance;Monitored during session;Repositioned;Patient requesting pain meds-RN notified;RN gave pain meds during session    Home Living Family/patient expects to be discharged to:: Private residence Living Arrangements: Alone Available Help at Discharge: Family;Personal care attendant;Available PRN/intermittently (living with daughter currently; (her aide recently had baby)) Type of Home: House Home Access: Stairs to enter Entrance Stairs-Rails: Psychiatric nurse of Steps: 4 Home Layout: One level Home Equipment: Environmental consultant - 2 wheels;Bedside commode;Shower seat;Adaptive equipment      Prior Function Level of Independence: Needs assistance   Gait / Transfers Assistance Needed: Supervision with functional ambulation in house using RW  ADL's / Homemaking  Assistance Needed: Min assist  overall for ADLs, pt reports she has sponge bathing and plans to continue this due to infection        Hand Dominance   Dominant Hand:  ("both handed")    Extremity/Trunk Assessment   Upper Extremity Assessment: Generalized weakness           Lower Extremity Assessment: Generalized weakness      Cervical / Trunk Assessment: Other exceptions  Communication   Communication: No difficulties  Cognition Arousal/Alertness: Awake/alert Behavior During Therapy: Flat affect Overall Cognitive Status: Within Functional Limits for tasks assessed                      General Comments      Exercises        Assessment/Plan    PT Assessment Patient needs continued PT services  PT Diagnosis Difficulty walking;Generalized weakness   PT Problem List Decreased strength;Decreased activity tolerance;Decreased balance;Decreased mobility;Decreased knowledge of precautions;Obesity;Pain  PT Treatment Interventions DME instruction;Gait training;Functional mobility training;Therapeutic activities;Stair training;Therapeutic exercise;Patient/family education   PT Goals (Current goals can be found in the Care Plan section) Acute Rehab PT Goals Patient Stated Goal: "Be more independent so I don't have to rely on other people!" PT Goal Formulation: With patient Time For Goal Achievement: 12/21/15 Potential to Achieve Goals: Good    Frequency Min 2X/week   Barriers to discharge Decreased caregiver support      Co-evaluation               End of Session   Activity Tolerance: Patient limited by fatigue;Patient limited by pain Patient left: in chair;with call bell/phone within reach Nurse Communication: Mobility status;Other (comment) (HHPT on D/C; no DME needs)         Time: [id number] PT Time Calculation (min) (ACUTE ONLY): 28 min   Charges:   PT Evaluation $PT Eval Moderate Complexity: 1 Procedure PT Treatments $Gait Training: 8-22 mins   PT G Codes:         Tyray Proch 12-24-2015, 1:39 PM Pager [phone]

## 2015-12-07 NOTE — NC FL2 (Signed)
[place] LEVEL OF CARE SCREENING TOOL     IDENTIFICATION  Patient Name: [name] Birthdate: 01-11-1951 Sex: female Admission Date (Current Location): 12/04/2015  [place] and [place] Number:  Kathleen Argue [id number] Jerilynn Mages ([id number]) Facility and Address:  [facility], [place], [place] [address] [place], [place] [address]      Provider Number: [id number]  Attending Physician Name and Address:  Oswald Hillock, MD  Relative Name and Phone Number:       Current Level of Care: Hospital Recommended Level of Care: [place] Prior Approval Number:    Date Approved/Denied:   PASRR Number:    Discharge Plan: SNF    Current Diagnoses: Patient Active Problem List   Diagnosis Date Noted  . Chronic pain syndrome 12/07/2015  . Elevated lipase 12/04/2015  . Intractable nausea and vomiting 12/04/2015  . Obstipation 12/04/2015  . Acute combined systolic and diastolic CHF, NYHA class 2 ([place]) 12/04/2015  . Nausea and vomiting 12/04/2015  . Uncontrollable vomiting   . Chronic combined systolic and diastolic CHF, NYHA class 1 ([place])   . Anemia due to other cause   . Wound infection after surgery 11/02/2015  . S/P lumbar spinal fusion 10/25/2015  . Morbid obesity ([place]) 03/17/2015  . 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 Essential hypertension 03/17/2015  . GERD (gastroesophageal reflux disease) 03/17/2015  . HLD (hyperlipidemia) 03/17/2015  . AKI (acute kidney injury) ([place]) 03/17/2015  . Discitis of lumbosacral region 03/06/2015  . CHF (congestive heart failure) ([place]) 03/06/2015  . Diarrhea 03/06/2015  . IBS (irritable bowel syndrome) 03/06/2015  . [place] 03/06/2015  . 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 Glenohumeral arthritis 10/24/2011    Orientation RESPIRATION BLADDER Height & Weight     Self, Time, Situation, Place  Normal Continent Weight: 250 lb 1.6 oz (113.445 kg) (bed scale; pt has wound vac on  back) Height:  5\' 3"  (160 cm)  BEHAVIORAL SYMPTOMS/MOOD NEUROLOGICAL BOWEL NUTRITION STATUS      Continent Diet (FULL LIQUID)  AMBULATORY STATUS COMMUNICATION OF NEEDS Skin   Limited Assist   Surgical wounds (LOWER BACK)                       Personal Care Assistance Level of Assistance  Dressing, Bathing Bathing Assistance: Maximum assistance   Dressing Assistance: Maximum assistance     Functional Limitations Info             SPECIAL CARE FACTORS FREQUENCY  PT (By licensed PT), OT (By licensed OT)     PT Frequency:  (2x/week)              Contractures      Additional Factors Info  Allergies, Code Status Code Status Info:  (FULL) Allergies Info:  (Aspirin, Ibuprofen, Mushroom Extract Complex, Shellfish Allergy, Sulfa Antibiotics, Betadine, Coconut Flavor, Codeine, Eggs Or Egg-derived Products, Iodine, Ivp Dye)           Current Medications (12/07/2015):  This is the current hospital active medication list Current Facility-Administered Medications  Medication Dose Route Frequency Provider Last Rate Last Dose  . 0.9 %  sodium chloride infusion   Intravenous Continuous Donne Hazel, MD 75 mL/hr at 12/07/15 1300    . acetaminophen (TYLENOL) tablet 650 mg  650 mg Oral Q6H PRN Toy Baker, MD       Or  . acetaminophen (TYLENOL) suppository 650 mg  650 mg Rectal Q6H PRN  Toy Baker, MD      . albuterol (PROVENTIL) (2.5 MG/3ML) 0.083% nebulizer solution 2.5 mg  2.5 mg Nebulization Q6H PRN Toy Baker, MD      . cefTRIAXone (ROCEPHIN) 2 g in dextrose 5 % 50 mL IVPB  2 g Intravenous Q24H Toy Baker, MD   2 g at 12/06/15 2244  . cloNIDine (CATAPRES - Dosed in mg/24 hr) patch 0.3 mg  0.3 mg Transdermal Q Thu Toy Baker, MD   0.3 mg at 12/07/15 1051  . collagenase (SANTYL) ointment   Topical Daily Eustace Moore, MD      . enoxaparin (LOVENOX) injection 40 mg  40 mg Subcutaneous Q24H Toy Baker, MD   40 mg at 12/07/15 1051   . gabapentin (NEURONTIN) capsule 600 mg  600 mg Oral TID Oswald Hillock, MD      . hydrALAZINE (APRESOLINE) injection 10 mg  10 mg Intravenous Q4H PRN Donne Hazel, MD      . insulin aspart (novoLOG) injection 0-9 Units  0-9 Units Subcutaneous 6 times per day Toy Baker, MD   0 Units at 12/04/15 2338  . methocarbamol (ROBAXIN) tablet 750 mg  750 mg Oral Q8H PRN Eustace Moore, MD   750 mg at 12/06/15 1511  . metoprolol tartrate (LOPRESSOR) tablet 12.5 mg  12.5 mg Oral BID Oswald Hillock, MD   12.5 mg at 12/07/15 1050  . morphine (MS CONTIN) 12 hr tablet 15 mg  15 mg Oral Q12H Oswald Hillock, MD      . morphine 2 MG/ML injection 2 mg  2 mg Intravenous Q4H PRN Donne Hazel, MD   2 mg at 12/07/15 1252  . ondansetron (ZOFRAN) tablet 4 mg  4 mg Oral Q6H PRN Toy Baker, MD       Or  . ondansetron (ZOFRAN) injection 4 mg  4 mg Intravenous Q6H PRN Toy Baker, MD   4 mg at 12/05/15 1045  . oxyCODONE-acetaminophen (PERCOCET/ROXICET) 5-325 MG per tablet 1-2 tablet  1-2 tablet Oral Q4H PRN Eustace Moore, MD      . pantoprazole (PROTONIX) injection 40 mg  40 mg Intravenous Q24H Donne Hazel, MD   40 mg at 12/06/15 1451  . promethazine (PHENERGAN) tablet 25 mg  25 mg Oral Q6H PRN Oswald Hillock, MD   25 mg at 12/07/15 1059  . simvastatin (ZOCOR) tablet 20 mg  20 mg Oral QPM Toy Baker, MD   20 mg at 12/06/15 2243  . sodium chloride flush (NS) 0.9 % injection 10-40 mL  10-40 mL Intracatheter PRN Toy Baker, MD   10 mL at 12/06/15 0834  . sodium chloride flush (NS) 0.9 % injection 3 mL  3 mL Intravenous Q12H Toy Baker, MD   3 mL at 12/06/15 1000     Discharge Medications: Please see discharge summary for a list of discharge medications.  Relevant Imaging Results:  Relevant Lab Results:   Additional Information  ([id number])  Leane Call, Student-SW [phone]

## 2015-12-07 NOTE — Evaluation (Signed)
Occupational Therapy Evaluation Patient Details Name: [name] MRN: [id number] DOB: December 09, 1950 Today's Date: 12/07/2015    History of Present Illness 65 yo who presented with one-week history of intermittent right lower quadrant abdominal pain nausea vomiting small episode of diarrhea. Pt s/p wound vac placement this admission. Past surgical history: Pt s/p lumbar wound debridement on 11/02/15, lumbar fusion and laminectomy/decompression microdiscectomy on 10/25/15. PMH: CHF, HTN, arthritis, anxiety, GERD, IBS   Clinical Impression   Patient presenting with decreased ADL and functional mobility independence secondary to above. Patient required assistance since original back surgery, March 8th and has been receiving HHOT, [place], [place]. Patient currently functioning at an overall min to max assist level for ADLs and functional mobility using RW. Patient will benefit from acute OT to increase overall independence in the areas of ADLs, functional mobility, and overall safety in order to safely discharge home with 24/7 supervision/assistance from family [place].     Follow Up Recommendations  Home health OT;Supervision/Assistance - 24 hour    Equipment Recommendations  None recommended by OT    Recommendations for Other Services  None at this time   Precautions / Restrictions Precautions Precautions: Fall;Back Precaution Comments: Pt able to independently verbalize "no bending, no arching, no twisting" Restrictions Weight Bearing Restrictions: No    Mobility Bed Mobility Overal bed mobility: Needs Assistance Bed Mobility: Supine to Sit     Supine to sit: Min assist;HOB elevated     General bed mobility comments: Heavy min assist for trunk support, pt able to manage BLEs off bed. Mod use of bed rails and HOB quite a bit elevated.   Transfers Overall transfer level: Needs assistance Equipment used: Rolling walker (2 wheeled) Transfers: Sit to/from Stand Sit to Stand: Min  assist;Min guard General transfer comment: Min guard/assist to steady    Balance Overall balance assessment: Needs assistance Sitting-balance support: No upper extremity supported;Feet supported       Standing balance support: Single extremity supported;During functional activity Standing balance-Leahy Scale: Fair Standing balance comment: Pt standing at sink for grooming tasks standing at sink    ADL Overall ADL's : Needs assistance/impaired Eating/Feeding: Set up;Sitting   Grooming: Set up;Supervision/safety;Min guard;Standing Grooming Details (indicate cue type and reason): cues for technique to adhere to back precautions during grooming tasks  Upper Body Bathing: Set up;Supervision/ safety;Sitting   Lower Body Bathing: Moderate assistance;Sit to/from stand Lower Body Bathing Details (indicate cue type and reason): needs LH sponge Upper Body Dressing : Set up;Supervision/safety;Sitting   Lower Body Dressing: Maximal assistance;Sit to/from stand Lower Body Dressing Details (indicate cue type and reason): needs AE Toilet Transfer: Min guard;Ambulation;RW;BSC   Toileting- Clothing Manipulation and Hygiene: Maximal assistance;Sit to/from stand Toileting - Clothing Manipulation Details (indicate cue type and reason): needs assistance due to body habitus and back precautions    Tub/Shower Transfer Details (indicate cue type and reason): n/a, pt unable to shower at this time due to infection and wound vac  Functional mobility during ADLs: Min guard;Rolling walker General ADL Comments: Pt limited due to increased pain, body habitus, back precautions, and generalized weakness    Vision Vision Assessment?: No apparent visual deficits          Pertinent Vitals/Pain Pain Assessment: 0-10 Pain Score: 5  Pain Location: back and buttock  Pain Descriptors / Indicators: Sore;Aching;Guarding;Discomfort Pain Intervention(s): Limited activity within patient's tolerance;Monitored during  session;Repositioned     Hand Dominance  ("both handed")   Extremity/Trunk Assessment Upper Extremity Assessment Upper Extremity Assessment: Generalized  weakness   Lower Extremity Assessment Lower Extremity Assessment: Defer to PT evaluation       Communication Communication Communication: No difficulties   Cognition Arousal/Alertness: Awake/alert Behavior During Therapy: WFL for tasks assessed/performed Overall Cognitive Status: Within Functional Limits for tasks assessed              Home Living Family/patient expects to be discharged to:: Private residence Living Arrangements: Alone Available Help at Discharge: Family;Personal care attendant;Available 24 hours/day (PCA 3-4 times per week staying up to 3 hours per day) Type of Home: House Home Access: Stairs to enter [employer] of Steps: 4 Entrance Stairs-Rails: Right;Left Home Layout: One level     Bathroom Shower/Tub: Occupational psychologist: Standard     Home Equipment: Environmental consultant - 2 wheels;Bedside commode;Shower seat;Adaptive equipment Adaptive Equipment: Reacher;Sock aid;Long-handled sponge   Prior Functioning/Environment Level of Independence: Needs assistance  Gait / Transfers Assistance Needed: Supervision with functional ambulation in house using RW ADL's / Homemaking Assistance Needed: Min assist overall for ADLs, pt reports she has sponge bathing and plans to continue this due to infection    OT Diagnosis: Generalized weakness;Acute pain   OT Problem List: Decreased strength;Decreased activity tolerance;Impaired balance (sitting and/or standing);Decreased safety awareness;Decreased knowledge of use of DME or AE;Pain   OT Treatment/Interventions: Self-care/ADL training;Energy conservation;DME and/or AE instruction;Patient/family education;Therapeutic activities;Balance training    OT Goals(Current goals can be found in the care plan section) Acute Rehab OT Goals Patient Stated  Goal: "Be more independent so I don't have to rely on other people!" OT Goal Formulation: With patient Time For Goal Achievement: 12/21/15 Potential to Achieve Goals: Good ADL Goals Pt Will Perform Grooming: with modified independence;standing Pt Will Perform Lower Body Bathing: sit to/from stand;with adaptive equipment;with min assist Pt Will Perform Lower Body Dressing: with min assist;sit to/from stand;with adaptive equipment Pt Will Transfer to Toilet: with supervision;ambulating;bedside commode Additional ADL Goal #1: Pt will be supervision with functional ambulation/mobility during ADL using RW  OT Frequency: Min 3X/week   Barriers to D/C: None known at this time   End of Session Equipment Utilized During Treatment: Gait belt;Rolling walker Nurse Communication: Mobility status;Other (comment) (pt with +BM)  Activity Tolerance: Patient tolerated treatment well Patient left: in chair;with call bell/phone within reach   Time: 1141-1218 OT Time Calculation (min): 37 min Charges:  OT General Charges $OT Visit: 1 Procedure OT Evaluation $OT Eval Moderate Complexity: 1 Procedure OT Treatments $Self Care/Home Management : 8-22 mins  Chrys Racer , MS, OTR/L, CLT Pager: [phone]  12/07/2015, 12:40 PM

## 2015-12-08 ENCOUNTER — Encounter (HOSPITAL_COMMUNITY): Payer: Self-pay | Admitting: General Practice

## 2015-12-08 DIAGNOSIS — G43A0 Cyclical vomiting, not intractable: Secondary | ICD-10-CM | POA: Diagnosis not present

## 2015-12-08 DIAGNOSIS — T814XXD Infection following a procedure, subsequent encounter: Secondary | ICD-10-CM

## 2015-12-08 DIAGNOSIS — Z981 Arthrodesis status: Secondary | ICD-10-CM | POA: Diagnosis not present

## 2015-12-08 DIAGNOSIS — T8131XA Disruption of external operation (surgical) wound, not elsewhere classified, initial encounter: Secondary | ICD-10-CM | POA: Diagnosis not present

## 2015-12-08 DIAGNOSIS — I110 Hypertensive heart disease with heart failure: Secondary | ICD-10-CM | POA: Diagnosis not present

## 2015-12-08 DIAGNOSIS — G894 Chronic pain syndrome: Secondary | ICD-10-CM

## 2015-12-08 DIAGNOSIS — I5041 Acute combined systolic (congestive) and diastolic (congestive) heart failure: Secondary | ICD-10-CM | POA: Diagnosis not present

## 2015-12-08 DIAGNOSIS — Z6841 Body Mass Index (BMI) 40.0 and over, adult: Secondary | ICD-10-CM | POA: Diagnosis not present

## 2015-12-08 DIAGNOSIS — R1031 Right lower quadrant pain: Secondary | ICD-10-CM | POA: Diagnosis not present

## 2015-12-08 DIAGNOSIS — I5042 Chronic combined systolic (congestive) and diastolic (congestive) heart failure: Secondary | ICD-10-CM | POA: Diagnosis not present

## 2015-12-08 DIAGNOSIS — K5900 Constipation, unspecified: Secondary | ICD-10-CM | POA: Diagnosis not present

## 2015-12-08 LAB — GLUCOSE, CAPILLARY
GLUCOSE-CAPILLARY: 119 mg/dL — AB (ref 65–99)
GLUCOSE-CAPILLARY: 118 mg/dL — AB (ref 65–99)
GLUCOSE-CAPILLARY: 107 mg/dL — AB (ref 65–99)
Glucose-Capillary: 117 mg/dL — ABNORMAL HIGH (ref 65–99)
Glucose-Capillary: 89 mg/dL (ref 65–99)

## 2015-12-08 MED ORDER — PROMETHAZINE HCL 25 MG PO TABS: 25.0000 mg | ORAL_TABLET | Freq: Four times a day (QID) | ORAL | Status: DC | PRN

## 2015-12-08 MED ORDER — MORPHINE SULFATE ER 15 MG PO TBCR: 15.0000 mg | EXTENDED_RELEASE_TABLET | Freq: Two times a day (BID) | ORAL | Status: DC

## 2015-12-08 MED ORDER — OXYCODONE-ACETAMINOPHEN 5-325 MG PO TABS: 1.0000 | ORAL_TABLET | ORAL | Status: DC | PRN

## 2015-12-08 MED ORDER — GABAPENTIN 300 MG PO CAPS: 600.0000 mg | ORAL_CAPSULE | Freq: Three times a day (TID) | ORAL | Status: DC

## 2015-12-08 MED ORDER — LOSARTAN POTASSIUM 50 MG PO TABS: 50.0000 mg | ORAL_TABLET | Freq: Every day | ORAL | Status: DC

## 2015-12-08 MED ORDER — PANTOPRAZOLE SODIUM 40 MG PO TBEC
40.0000 mg | DELAYED_RELEASE_TABLET | Freq: Every day | ORAL | Status: DC
Start: 2015-12-09 — End: 2015-12-08

## 2015-12-08 MED ORDER — COLLAGENASE 250 UNIT/GM EX OINT: TOPICAL_OINTMENT | Freq: Every day | CUTANEOUS | Status: DC

## 2015-12-08 NOTE — Discharge Summary (Addendum)
Physician Discharge Summary  [name] [id number] DOB: February 16, 1951 DOA: 12/04/2015  PCP: Philis Fendt, MD  Admit date: 12/04/2015 Discharge date: 12/08/2015  Time spent: 25* minutes  Recommendations for Outpatient Follow-up:  1. Follow up Neurosurgery  in 2 weeks 2. Wound care at SNF  Discharge Diagnoses:  Active Problems:   Essential hypertension   Wound infection after surgery   Elevated lipase   Intractable nausea and vomiting   Obstipation   Acute combined systolic and diastolic CHF, NYHA class 2 (HCC)   Nausea and vomiting   Chronic pain syndrome   Discharge Condition: Stable  Diet recommendation: Low salt diet  Filed Weights   12/06/15 0500 12/07/15 0427 12/08/15 0532  Weight: 112.3 kg (247 lb 9.2 oz) 113.445 kg (250 lb 1.6 oz) 117.618 kg (259 lb 4.8 oz)    History of present illness:  64yo who presented with one-week history of intermittent right lower quadrant abdominal pain nausea vomiting small episode of diarrhea. She was seen for this in emergency department on the 4/15, found to have large amount of colonic stool. On return, patient noted to have less stool burden on abd xray. Patient was admitted for continued n/v/d.  Hospital Course:  Intractable nausea and vomiting- patient noted to have large stool on CT scan 2 prior to admission. Recent KUB showing decreased stool burden. Etiology of nausea vomiting could be possibly secondary to constipation versus gastroenteritis, GI panel pending. Cannot give Zofran due to Qtc prolongation, will start with phenergan 25 mg po q 6 hr prn.  . Essential hypertension  stable continue home medications . Will discontinue HCTZ  . Elevated lipase has been coming down CT scan of abdomen showed no evidence of pancreatitis   . Chronic combined systolic and diastolic CHF,  NYHA class 2 ([place]) - currently does not appear to be fluid overloaded. Chronic continue home medications  Lumbar wound infection Patient was  discharged by neurosurgery on 11/09/15 after I and D of lumbar wound infection.She was discharged on ceftriaxone for three weeks. I called and discussed with neurosurgeon Dr. Ronnald Ramp, who recommended to continue wound VAC. No antibiotics at this time. 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 We'll increase the dose of gabapentin to 600 mg 3 times a day. Will start the patient on long-acting MS Contin 15 mg every 12 hours, and continue with percocet prn for breakthrough pain.  As per social work, patient does not qualify to stay at the rehab. She now wants to go home, will discharge with HHPT, RN, aide, social work  Procedures:  None   Consultations:  Neurosurgery  Discharge Exam: Filed Vitals:   12/07/15 2228 12/08/15 0532  BP: 135/88 136/78  Pulse: 88 83  Temp: 98.1 F (36.7 C) 98.1 F (36.7 C)  Resp: 20 20    General: Appears in no acute distress Cardiovascular: S1S2 RRR Respiratory: Clear bilaterally  Discharge Instructions   Discharge Instructions    Diet - low sodium heart healthy    Complete by:  As directed      Increase activity slowly    Complete by:  As directed           Current Discharge Medication List    START taking these medications   Details  collagenase (SANTYL) ointment Apply topically daily. Apply to upper back wound Q day, then cover with moist fluffed gauze and foam  dressing.  (Change foam dressing Q 5 days or PRN soiling.) Qty: 15 g, Refills: 0    gabapentin (NEURONTIN) 300 MG capsule Take 2 capsules (600 mg total) by mouth 3 (three) times daily.    losartan (COZAAR) 50 MG tablet Take 1 tablet (50 mg total) by mouth daily.    morphine (MS CONTIN) 15 MG 12 hr tablet Take 1 tablet (15 mg total) by mouth every 12 (twelve) hours. Qty: 10 tablet, Refills: 0    oxyCODONE-acetaminophen (PERCOCET/ROXICET) 5-325 MG tablet  Take 1-2 tablets by mouth every 4 (four) hours as needed for moderate pain. Qty: 30 tablet, Refills: 0    promethazine (PHENERGAN) 25 MG tablet Take 1 tablet (25 mg total) by mouth every 6 (six) hours as needed for nausea or vomiting. Qty: 30 tablet, Refills: 0      CONTINUE these medications which have NOT CHANGED   Details  albuterol (PROVENTIL) (2.5 MG/3ML) 0.083% nebulizer solution Take 2.5 mg by nebulization every 6 (six) hours as needed for wheezing.    citalopram (CELEXA) 40 MG tablet Take 40 mg by mouth daily. Refills: 0    cloNIDine (CATAPRES - DOSED IN MG/24 HR) 0.3 mg/24hr patch Place 0.3 mg onto the skin every Thursday.     diphenoxylate-atropine (LOMOTIL) 2.5-0.025 MG per tablet Take 1 tablet by mouth daily as needed. Upset stomach Refills: 0    Eluxadoline (VIBERZI) 75 MG TABS Take 75 mg by mouth 2 (two) times daily.    furosemide (LASIX) 80 MG tablet Take 80 mg by mouth daily.     methocarbamol (ROBAXIN) 500 MG tablet Take 1 tablet (500 mg total) by mouth every 6 (six) hours as needed for muscle spasms. Qty: 60 tablet, Refills: 1    metoprolol tartrate (LOPRESSOR) 25 MG tablet Take 0.5 tablets (12.5 mg total) by mouth 2 (two) times daily. Qty: 90 tablet, Refills: 3    NEXIUM 40 MG capsule Take 1 tablet by mouth daily. Refills: 5    ondansetron (ZOFRAN ODT) 4 MG disintegrating tablet Take 1 tablet (4 mg total) by mouth every 8 (eight) hours as needed for nausea or vomiting. Qty: 20 tablet, Refills: 0    polyethylene glycol powder (MIRALAX) powder Take one capful by mouth twice daily until stools are loose. Qty: 255 g, Refills: 0    potassium chloride SA (K-DUR,KLOR-CON) 20 MEQ tablet Take 20 mEq by mouth 2 (two) times daily.    simvastatin (ZOCOR) 20 MG tablet Take 20 mg by mouth every evening.    Vitamin D, Ergocalciferol, (DRISDOL) 50000 UNITS CAPS Take 50,000 Units by mouth every 7 (seven) days. On Wednesday      STOP taking these medications      albuterol (PROVENTIL HFA;VENTOLIN HFA) 108 (90 BASE) MCG/ACT inhaler      cefTRIAXone (ROCEPHIN) 2 g injection      losartan-hydrochlorothiazide (HYZAAR) 100-12.5 MG per tablet      Oxycodone HCl 10 MG TABS        Allergies  Allergen Reactions  . Aspirin Other (See Comments)    Stomach bleeding  . Ibuprofen Other (See Comments)    Stomach bleeding  . Mushroom Extract Complex Hives and Itching  . Shellfish Allergy Hives and Itching  . Sulfa Antibiotics Hives and Itching  . Betadine [Povidone Iodine] Itching  . Coconut Flavor Itching  . Codeine Itching  . Eggs Or Egg-Derived Products Nausea And Vomiting  . Iodine Itching  . Ivp Dye [Iodinated Diagnostic Agents] Hives    Takes Benadryl  50mg  PO before receiving iodinated contrast    Follow-up Information    Follow up with Philis Fendt, MD.   Specialty:  Internal Medicine   Contact information:   [address] [place] [address] [phone]       Follow up with [facility] .   Specialty:  [place] information:   [id number] [place] [phone]       The results of significant diagnostics from this hospitalization (including imaging, microbiology, ancillary and laboratory) are listed below for reference.    Significant Diagnostic Studies: Ct Abdomen Pelvis Wo Contrast  12/02/2015  CLINICAL DATA:  Abdominal pain with nausea and emesis, onset yesterday. EXAM: CT ABDOMEN AND PELVIS WITHOUT CONTRAST TECHNIQUE: Multidetector CT imaging of the abdomen and pelvis was performed following the standard protocol without IV contrast. COMPARISON:  03/19/2015 FINDINGS: There are unremarkable unenhanced appearances of the liver, pancreas, spleen, adrenals and kidneys with the exception of splenic granulomata and a 3 cm cyst of the right lower pole kidney, unchanged from the MRI of 02/06/2015. There is no hydronephrosis or ureteral dilatation.  Bowel is remarkable only for large volume colonic stool and moderate stool distention of the rectum. Abdominal aorta is normal in caliber and intact. No acute inflammatory changes are evident in the abdomen or pelvis. There is no adenopathy. There is no ascites. There is prior ventral herniorrhaphy with mesh. There is recent lumbar decompression with posterior instrumented fusion. There are a few air bubbles at the superficial aspect of the musculature at the incision site. No drainable collection is evident although there is stranding in the soft tissues. IMPRESSION: 1. No acute findings are evident in the abdomen or pelvis. 2. Generous volume colonic stool. Moderate rectal distention with stool. 3. There are a few air bubbles within the recent lumbar surgical wound, extending down to the superficial aspect of the musculature. No drainable collection is evident. Electronically Signed   By: Andreas Newport M.D.   On: 12/02/2015 05:20   Dg Abd 1 View  12/04/2015  CLINICAL DATA:  Obstipation.  Nausea and vomiting for 1 week. EXAM: ABDOMEN - 1 VIEW COMPARISON:  CT 2 days prior. FINDINGS: Normal bowel gas pattern without dilated bowel loops. Air throughout normal caliber small and large bowel. Small volume of colonic stool. Tacks in the anterior abdominal wall from prior ventral hernia repair. Surgical hardware in the lumbar spine. IMPRESSION: Normal bowel gas pattern. Electronically Signed   By: Jeb Levering M.D.   On: 12/04/2015 21:44   Dg Chest Port 1 View  12/05/2015  CLINICAL DATA:  Right-sided PICC line placement. EXAM: PORTABLE CHEST 1 VIEW COMPARISON:  10/27/2015 FINDINGS: Interval placement of a right PICC line with tip over the low SVC region. No pneumothorax. Normal heart size and pulmonary vascularity. No focal airspace disease or consolidation in the lungs. No blunting of costophrenic angles. No pneumothorax. Mediastinal contours appear intact. Postoperative right shoulder arthroplasty.  IMPRESSION: Right PICC catheter appears in satisfactory position. No pneumothorax. Electronically Signed   By: Lucienne Capers M.D.   On: 12/05/2015 01:55    Microbiology: Recent Results (from the past 240 hour(s))  Gastrointestinal Panel by PCR , Stool     Status: None   Collection Time: 12/05/15  8:25 PM  Result Value Ref Range Status   Campylobacter species NOT DETECTED NOT DETECTED Final   Plesimonas shigelloides NOT DETECTED NOT DETECTED Final   Salmonella species NOT DETECTED  NOT DETECTED Final   Yersinia enterocolitica NOT DETECTED NOT DETECTED Final   Vibrio species NOT DETECTED NOT DETECTED Final   Vibrio cholerae NOT DETECTED NOT DETECTED Final   Enteroaggregative E coli (EAEC) NOT DETECTED NOT DETECTED Final   Enteropathogenic E coli (EPEC) NOT DETECTED NOT DETECTED Final   Enterotoxigenic E coli (ETEC) NOT DETECTED NOT DETECTED Final   Shiga like toxin producing E coli (STEC) NOT DETECTED NOT DETECTED Final   E. coli O157 NOT DETECTED NOT DETECTED Final   Shigella/Enteroinvasive E coli (EIEC) NOT DETECTED NOT DETECTED Final   Cryptosporidium NOT DETECTED NOT DETECTED Final   Cyclospora cayetanensis NOT DETECTED NOT DETECTED Final   Entamoeba histolytica NOT DETECTED NOT DETECTED Final   Giardia lamblia NOT DETECTED NOT DETECTED Final   Adenovirus F40/41 NOT DETECTED NOT DETECTED Final   Astrovirus NOT DETECTED NOT DETECTED Final   Norovirus GI/GII NOT DETECTED NOT DETECTED Final   Rotavirus A NOT DETECTED NOT DETECTED Final   Sapovirus (I, II, IV, and V) NOT DETECTED NOT DETECTED Final     Labs: Basic Metabolic Panel:  Recent Labs Lab 12/02/15 0410 12/04/15 1740 12/05/15 0336 12/06/15 0423  NA 141 144 144 144  K 3.4* 3.5 3.5 3.5  CL 108 110 109 111  CO2 19* 21* 24 21*  GLUCOSE 113* 116* 102* 81  BUN <5* <5* <5* <5*  CREATININE 0.72 0.90 0.83 0.78  CALCIUM 8.7* 8.6* 8.2* 8.1*  MG  --   --  1.7  --   PHOS  --   --  3.2  --    Liver Function  Tests:  Recent Labs Lab 12/02/15 0410 12/04/15 1740 12/05/15 0336  AST 20 21 17   ALT 11* 11* 9*  ALKPHOS 70 72 66  BILITOT 0.4 0.5 0.3  PROT 6.9 6.8 6.5  ALBUMIN 2.6* 2.7* 2.5*    Recent Labs Lab 12/02/15 0410 12/04/15 1740  LIPASE 63* 52*   No results for input(s): AMMONIA in the last 168 hours. CBC:  Recent Labs Lab 12/02/15 0410 12/04/15 1740 12/05/15 0336 12/06/15 0423  WBC 3.9* 3.6* 4.2 4.4  NEUTROABS 1.8 1.4*  --   --   HGB 10.2* 11.1* 10.0* 9.9*  HCT 32.3* 34.7* 32.3* 32.0*  MCV 84.1 85.5 85.9 86.5  PLT 318 330 287 293   Cardiac Enzymes:  Recent Labs Lab 12/04/15 2215 12/05/15 0336 12/05/15 1010  TROPONINI <0.03 <0.03 <0.03   BNP: BNP (last 3 results) No results for input(s): BNP in the last 8760 hours.  ProBNP (last 3 results) No results for input(s): PROBNP in the last 8760 hours.  CBG:  Recent Labs Lab 12/07/15 1639 12/07/15 2001 12/08/15 12/08/15 0352 12/08/15 0811  GLUCAP 84 121* 117* 89 119*       Signed:  Eleonore Chiquito S MD.  [facility] 12/08/2015, 11:36 AM

## 2015-12-08 NOTE — Clinical Social Work Placement (Signed)
   CLINICAL SOCIAL WORK PLACEMENT  NOTE  Date:  12/08/2015  Patient Details  Name: [name] MRN: [id number] Date of Birth: [birth year]  Clinical Social Work is seeking post-discharge placement for this patient at the [place] level of care (*CSW will initial, date and re-position this form in  chart as items are completed):  Yes   Patient/family provided with [place] Work Department's list of facilities offering this level of care within the geographic area requested by the patient (or if unable, by the patient's family).  Yes   Patient/family informed of their freedom to choose among providers that offer the needed level of care, that participate in Medicare, Medicaid or managed care program needed by the patient, have an available bed and are willing to accept the patient.  Yes   Patient/family informed of [facility]'s ownership interest in [facility] and [facility], as well as of the fact that they are under no obligation to receive care at these facilities.  PASRR submitted to EDS on       PASRR number received on       Existing PASRR number confirmed on 12/08/15     FL2 transmitted to all facilities in geographic area requested by pt/family on 12/07/15     FL2 transmitted to all facilities within larger geographic area on       Patient informed that his/her managed care company has contracts with or will negotiate with certain facilities, including the following:   (Yes)     Yes   Patient/family informed of bed offers received.  Patient chooses bed at [facility] recommends and patient chooses bed at      Patient to be transferred to [facility] on 12/08/15.  Patient to be transferred to facility by [facility]     Patient family notified on 12/08/15 of transfer.  Name of family member notified:  Leonia Reader, Sean (Left voicemails for both)     PHYSICIAN Please sign FL2      Additional Comment:    _______________________________________________ Candie Chroman, LCSW 12/08/2015, 12:57 PM

## 2015-12-08 NOTE — Clinical Social Work Note (Addendum)
[facility] called and stated that patient does not have Medicare Part A (SNF benefit) and will not be able to come to their facility. [facility] is willing to take her since she has Medicaid but she will have to stay 30 days. Patient not willing to do this and wants to go home. She will have not received her Medicaid check if she went for 30 days. She has an Therapist, sports, aide, PT, and OT that come to her home through home health. Spoke with RNCM and home health will be restarted. Patient will be sent home with a wheelchair. Patient prefers to go home. She will contact a support for a ride home today. CSW called patient's daughter and left a voicemail. Patient calm and pleasant during conversation.  Physician notified. Nurse notified. RNCM notified.  CSW signing off. Notify if any other social work needs arise.  Dayton Scrape, [place]

## 2015-12-08 NOTE — Progress Notes (Signed)
Patient now wants to go home. Will order HHPT,RN, Aide, social work/

## 2015-12-08 NOTE — Consult Note (Addendum)
WOC wound follow-up consult note Reason for Consult:  Vac dressing change to lower back wound Wound type: surgical full thickness; appearance slightly improved from previous assessment. Drainage (amount, consistency, odor) Small amt yellow drainage in the cannister Periwound:intact with multiple deep skin folds surrounding the wound which makes the area constantly moist; it will be difficult to maintain a seal for the Vac dressing.  Applied one piece black foam into wound bed, and another bridged to hip for track pad placement. Applied barrier ring surrounding wound in skin creases to assist with maintaining a seal. Cont suction on at 134mm. Pt tolerated with minimal amt pain. Pt plans to transfer to SNF, according to progress notes.  Julien Girt MSN, RN, [place], [place], [place]

## 2015-12-08 NOTE — Clinical Social Work Note (Signed)
CSW facilitated patient discharge including contacting patient family and facility to confirm patient discharge plans. Clinical information faxed to facility and family agreeable with plan. Her children were called about discharge time but did not answer. CSW left voicemails for both. CSW arranged ambulance transport via [facility] to [place]. RN to call report prior to discharge.  CSW will sign off for now as social work intervention is no longer needed. Please consult [name] again if new needs arise.  Dayton Scrape, [place]

## 2015-12-08 NOTE — Care Management Note (Addendum)
Case Management Note  Patient Details  Name: GENIAH WILLISTON MRN: [id number] Date of Birth: 04/24/1951  Subjective/Objective:    Nausea, vomiting,  Abdominal pain, recent hospitalization 11/25/2015             Action/Plan: Discharge Planning:  NCM spoke to pt and states she is active with [facility] for [facility] RN for IV abx, wound care and PT. States she is staying with dtr, Leonia Reader. She has RW, shower chair and 3n1 bedside commode. Will need resumption of care orders for [facility] at dc. NCM will continue to follow for dc needs and arranging HH.   PCP- Nolene Ebbs MD Expected Discharge Date:                  Expected Discharge Plan:  [place]  In-House Referral:  NA, Clinical Social Work  Discharge planning Services  CM Consult  Post Acute Care Choice:    Choice offered to:  Patient  DME Arranged:  Negative pressure wound device, Wheelchair manual DME Agency:  NA, [place]:  RN, PT, OT, Social Work [employer] Agency:  [place]  Status of Service:  Completed, signed off  Medicare Important Message Given:  Yes Date Medicare IM Given:    Medicare IM give by:    Date Additional Medicare IM Given:    Additional Medicare Important Message give by:     If discussed at [place] of Stay Meetings, dates discussed:    Additional Comments: 12/08/2015  Discharge plan has changed due to insurance barrier - see CSW notes.  Pt request to go home rather than loose stated benefit.  Pt continues to chose/active with [facility].  CM contacted agency - referral accepted for wound vac , wheelchair and [place] with the exception of a aide (pt already has [facility] aide come to the home 7 days a week)   Per attending; pt will not discharge home on IV antibiotics.  Pt will discharge home today [facility] made aware.   11:39 Pt will discharge today to SNF - CSW arranging wound vac for SNF.  12/07/15 Pt evaluated pt and are now recommending SNF.  CSW consulted.  [facility] DME liaison  made aware  12/06/15 CM spoke with surgery Dr Ronnald Ramp; pt getting wound vac placed today, requesting PT/OT evaluation as MD feels that pt will not be safe in the home setting. Pt given choice for both HH and DME; pt chose [facility].   Dr Lama/Dr Ronnald Ramp agreed to [facility] for wound vac supplier - agency accepted referral.  CM contacted IV [facility] nurse and [facility] to inform of admit with pending discharge 12/06/15.  CM will continue to follow.  CM contacted daughter as requested and answered questions regarding inpt/observation status. Maryclare Labrador, RN 12/08/2015, 3:32 PM

## 2015-12-08 NOTE — Clinical Social Work Note (Signed)
CSW met with patient to extend bed offers. Patient chose to go to [facility]. Explained that patient would be discharged today. Time unknown. CSW called Izola Price [phone]).at [facility] to make them aware that she had decided to come there and that she is in need of a wound vac. Will send discharge summary once available.  Dayton Scrape, [place]

## 2015-12-08 NOTE — Progress Notes (Signed)
Pt has orders to be discharged to home; discharge to SNF cancelled. Discharge instructions given and pt has no additional questions at this time. Medication regimen reviewed and pt educated. Paper prescriptions given to patient. Pt verbalized understanding and has no additional questions. Telemetry box removed. PICC removed by IV team RN and site in good condition. Wound vac was removed earlier due to patient was going to discharge to SNF and wet to dry dressing applied. HHRN to replace wound vac tomorrow morning per social worker/MD. Patient given supplies for family to redress site if needed. Pt stable and waiting for transportation.

## 2015-12-08 NOTE — Progress Notes (Signed)
Occupational Therapy Treatment Patient Details Name: [name] MRN: [id number] DOB: 06/23/1951 Today's Date: 12/08/2015    History of present illness 65 yo who presented with one-week history of intermittent right lower quadrant abdominal pain nausea vomiting small episode of diarrhea. Pt s/p wound vac placement to thoracic wound due to dehiscence (new this admission). Past surgical history: Pt s/p lumbar wound debridement on 11/02/15, lumbar fusion and laminectomy/decompression microdiscectomy on 10/25/15. PMH: CHF, HTN, arthritis, anxiety, GERD, IBS   OT comments  Pt seen for OT treatment session today with focus on functional mobility and transfers/simulated toilet transfer, back precautions. Pt had already performed ADL's earlier. Overall Min guard assist and vc's for precautions. She is hopeful to d/c to SNF rehab later today.  Follow Up Recommendations  SNF;Supervision/Assistance - 24 hour    Equipment Recommendations  Other (comment) (Defer to next venue)    Recommendations for Other Services      Precautions / Restrictions Precautions Precautions: Fall;Back Precaution Comments: Pt able to independently verbalize "no bending, no arching, no twisting" Twice required vc to avoid bending while seated (having abd cramps and bending forward holding her abd/propping on elbows) Restrictions Weight Bearing Restrictions: No       Mobility Bed Mobility Overal bed mobility: Needs Assistance Bed Mobility: Supine to Sit     Supine to sit: Min assist;HOB elevated     General bed mobility comments: Increased time for tasks; Min assist at trunk w/ HOB elevated & used chair pad to scoot to EOB. Pt able to independently state back precautions however benefits from vc's to adhere to them during transfers and functional mobility.  Transfers Overall transfer level: Needs assistance Equipment used: Rolling walker (2 wheeled) Transfers: Sit to/from Stand Sit to Stand: Min guard          General transfer comment: No physical assist; minguard for safety as pt "feels weak"     Balance Overall balance assessment: Needs assistance Sitting-balance support: No upper extremity supported;Feet supported Sitting balance-Leahy Scale: Good     Standing balance support: No upper extremity supported Standing balance-Leahy Scale: Fair Standing balance comment: Pt standing at EOB prior to amb & transfer.                   ADL Overall ADL's : Needs assistance/impaired Eating/Feeding: Set up;Sitting                       Toilet Transfer: Min guard;BSC;RW (Simulated transfer todat bed to chair as pt stated that she didn't need to use bathroom & already performed ADL's earlier) Toilet Transfer Details (indicate cue type and reason): Increased time for mobility. I states back precautions         Functional mobility during ADLs: Min guard;Rolling walker General ADL Comments: Pt reporting decreased pain today during OT treatment session. Focus on back precautions, functional transfers, functional mobility as pt declined grooming and ADL's stating that she had already performed earlier. Pain has been limiting factor in past as well as body habitus, back precautions and generalized weakness she should benefit from short term rehab at [facility] as planned.      Vision  No change from baseline                   Perception     Praxis      Cognition   Behavior During Therapy: Flat affect Overall Cognitive Status: Within Functional Limits for tasks assessed  Extremity/Trunk Assessment               Exercises     Shoulder Instructions       General Comments      Pertinent Vitals/ Pain       Pain Assessment: 0-10 Pain Score: 3  Pain Location: Buttocks Pain Descriptors / Indicators: Cramping;Grimacing;Guarding Pain Intervention(s): Limited activity within patient's tolerance;Monitored during  session;Repositioned  Home Living                                          Prior Functioning/Environment              Frequency Min 3X/week     Progress Toward Goals  OT Goals(current goals can now be found in the care plan section)  Progress towards OT goals: Progressing toward goals     Plan  (Discharge plan has been updated to SNF rehab, possible d/c later today.)    Co-evaluation                 End of Session Equipment Utilized During Treatment: Rolling walker   Activity Tolerance Patient tolerated treatment well   Patient Left in chair;with call bell/phone within reach;with chair alarm set   Nurse Communication          Time: [phone] OT Time Calculation (min): 29 min  Charges: OT General Charges $OT Visit: 1 Procedure OT Treatments $Self Care/Home Management : 8-22 mins $Therapeutic Activity: 8-22 mins  Kobie Matkins Beth Dixon, OTR/L 12/08/2015, 9:19 AM

## 2015-12-09 DIAGNOSIS — K219 Gastro-esophageal reflux disease without esophagitis: Secondary | ICD-10-CM | POA: Diagnosis not present

## 2015-12-09 DIAGNOSIS — T814XXD Infection following a procedure, subsequent encounter: Secondary | ICD-10-CM | POA: Diagnosis not present

## 2015-12-09 DIAGNOSIS — I509 Heart failure, unspecified: Secondary | ICD-10-CM | POA: Diagnosis not present

## 2015-12-09 DIAGNOSIS — M150 Primary generalized (osteo)arthritis: Secondary | ICD-10-CM | POA: Diagnosis not present

## 2015-12-09 DIAGNOSIS — K589 Irritable bowel syndrome without diarrhea: Secondary | ICD-10-CM | POA: Diagnosis not present

## 2015-12-09 DIAGNOSIS — I10 Essential (primary) hypertension: Secondary | ICD-10-CM | POA: Diagnosis not present

## 2015-12-11 DIAGNOSIS — M150 Primary generalized (osteo)arthritis: Secondary | ICD-10-CM | POA: Diagnosis not present

## 2015-12-11 DIAGNOSIS — K219 Gastro-esophageal reflux disease without esophagitis: Secondary | ICD-10-CM | POA: Diagnosis not present

## 2015-12-11 DIAGNOSIS — T814XXD Infection following a procedure, subsequent encounter: Secondary | ICD-10-CM | POA: Diagnosis not present

## 2015-12-11 DIAGNOSIS — I10 Essential (primary) hypertension: Secondary | ICD-10-CM | POA: Diagnosis not present

## 2015-12-11 DIAGNOSIS — I509 Heart failure, unspecified: Secondary | ICD-10-CM | POA: Diagnosis not present

## 2015-12-11 DIAGNOSIS — K589 Irritable bowel syndrome without diarrhea: Secondary | ICD-10-CM | POA: Diagnosis not present

## 2015-12-12 DIAGNOSIS — I509 Heart failure, unspecified: Secondary | ICD-10-CM | POA: Diagnosis not present

## 2015-12-12 DIAGNOSIS — T814XXD Infection following a procedure, subsequent encounter: Secondary | ICD-10-CM | POA: Diagnosis not present

## 2015-12-12 DIAGNOSIS — M150 Primary generalized (osteo)arthritis: Secondary | ICD-10-CM | POA: Diagnosis not present

## 2015-12-12 DIAGNOSIS — K219 Gastro-esophageal reflux disease without esophagitis: Secondary | ICD-10-CM | POA: Diagnosis not present

## 2015-12-12 DIAGNOSIS — I10 Essential (primary) hypertension: Secondary | ICD-10-CM | POA: Diagnosis not present

## 2015-12-12 DIAGNOSIS — K589 Irritable bowel syndrome without diarrhea: Secondary | ICD-10-CM | POA: Diagnosis not present

## 2015-12-13 DIAGNOSIS — I10 Essential (primary) hypertension: Secondary | ICD-10-CM | POA: Diagnosis not present

## 2015-12-13 DIAGNOSIS — I509 Heart failure, unspecified: Secondary | ICD-10-CM | POA: Diagnosis not present

## 2015-12-13 DIAGNOSIS — K589 Irritable bowel syndrome without diarrhea: Secondary | ICD-10-CM | POA: Diagnosis not present

## 2015-12-13 DIAGNOSIS — M150 Primary generalized (osteo)arthritis: Secondary | ICD-10-CM | POA: Diagnosis not present

## 2015-12-13 DIAGNOSIS — K219 Gastro-esophageal reflux disease without esophagitis: Secondary | ICD-10-CM | POA: Diagnosis not present

## 2015-12-13 DIAGNOSIS — T814XXD Infection following a procedure, subsequent encounter: Secondary | ICD-10-CM | POA: Diagnosis not present

## 2015-12-14 DIAGNOSIS — K589 Irritable bowel syndrome without diarrhea: Secondary | ICD-10-CM | POA: Diagnosis not present

## 2015-12-14 DIAGNOSIS — T814XXD Infection following a procedure, subsequent encounter: Secondary | ICD-10-CM | POA: Diagnosis not present

## 2015-12-14 DIAGNOSIS — K219 Gastro-esophageal reflux disease without esophagitis: Secondary | ICD-10-CM | POA: Diagnosis not present

## 2015-12-14 DIAGNOSIS — I509 Heart failure, unspecified: Secondary | ICD-10-CM | POA: Diagnosis not present

## 2015-12-14 DIAGNOSIS — I10 Essential (primary) hypertension: Secondary | ICD-10-CM | POA: Diagnosis not present

## 2015-12-14 DIAGNOSIS — M150 Primary generalized (osteo)arthritis: Secondary | ICD-10-CM | POA: Diagnosis not present

## 2015-12-15 DIAGNOSIS — I509 Heart failure, unspecified: Secondary | ICD-10-CM | POA: Diagnosis not present

## 2015-12-15 DIAGNOSIS — K589 Irritable bowel syndrome without diarrhea: Secondary | ICD-10-CM | POA: Diagnosis not present

## 2015-12-15 DIAGNOSIS — K219 Gastro-esophageal reflux disease without esophagitis: Secondary | ICD-10-CM | POA: Diagnosis not present

## 2015-12-15 DIAGNOSIS — M150 Primary generalized (osteo)arthritis: Secondary | ICD-10-CM | POA: Diagnosis not present

## 2015-12-15 DIAGNOSIS — I10 Essential (primary) hypertension: Secondary | ICD-10-CM | POA: Diagnosis not present

## 2015-12-15 DIAGNOSIS — T814XXD Infection following a procedure, subsequent encounter: Secondary | ICD-10-CM | POA: Diagnosis not present

## 2015-12-18 DIAGNOSIS — K219 Gastro-esophageal reflux disease without esophagitis: Secondary | ICD-10-CM | POA: Diagnosis not present

## 2015-12-18 DIAGNOSIS — M150 Primary generalized (osteo)arthritis: Secondary | ICD-10-CM | POA: Diagnosis not present

## 2015-12-18 DIAGNOSIS — K589 Irritable bowel syndrome without diarrhea: Secondary | ICD-10-CM | POA: Diagnosis not present

## 2015-12-18 DIAGNOSIS — I509 Heart failure, unspecified: Secondary | ICD-10-CM | POA: Diagnosis not present

## 2015-12-18 DIAGNOSIS — T814XXD Infection following a procedure, subsequent encounter: Secondary | ICD-10-CM | POA: Diagnosis not present

## 2015-12-18 DIAGNOSIS — I10 Essential (primary) hypertension: Secondary | ICD-10-CM | POA: Diagnosis not present

## 2015-12-19 DIAGNOSIS — I509 Heart failure, unspecified: Secondary | ICD-10-CM | POA: Diagnosis not present

## 2015-12-19 DIAGNOSIS — K219 Gastro-esophageal reflux disease without esophagitis: Secondary | ICD-10-CM | POA: Diagnosis not present

## 2015-12-19 DIAGNOSIS — I10 Essential (primary) hypertension: Secondary | ICD-10-CM | POA: Diagnosis not present

## 2015-12-19 DIAGNOSIS — T814XXD Infection following a procedure, subsequent encounter: Secondary | ICD-10-CM | POA: Diagnosis not present

## 2015-12-19 DIAGNOSIS — K589 Irritable bowel syndrome without diarrhea: Secondary | ICD-10-CM | POA: Diagnosis not present

## 2015-12-19 DIAGNOSIS — M150 Primary generalized (osteo)arthritis: Secondary | ICD-10-CM | POA: Diagnosis not present

## 2015-12-20 DIAGNOSIS — K589 Irritable bowel syndrome without diarrhea: Secondary | ICD-10-CM | POA: Diagnosis not present

## 2015-12-20 DIAGNOSIS — I509 Heart failure, unspecified: Secondary | ICD-10-CM | POA: Diagnosis not present

## 2015-12-20 DIAGNOSIS — M150 Primary generalized (osteo)arthritis: Secondary | ICD-10-CM | POA: Diagnosis not present

## 2015-12-20 DIAGNOSIS — T814XXD Infection following a procedure, subsequent encounter: Secondary | ICD-10-CM | POA: Diagnosis not present

## 2015-12-20 DIAGNOSIS — I10 Essential (primary) hypertension: Secondary | ICD-10-CM | POA: Diagnosis not present

## 2015-12-20 DIAGNOSIS — K219 Gastro-esophageal reflux disease without esophagitis: Secondary | ICD-10-CM | POA: Diagnosis not present

## 2015-12-21 ENCOUNTER — Inpatient Hospital Stay: Payer: Self-pay | Admitting: Internal Medicine

## 2015-12-21 DIAGNOSIS — M150 Primary generalized (osteo)arthritis: Secondary | ICD-10-CM | POA: Diagnosis not present

## 2015-12-21 DIAGNOSIS — I509 Heart failure, unspecified: Secondary | ICD-10-CM | POA: Diagnosis not present

## 2015-12-21 DIAGNOSIS — K589 Irritable bowel syndrome without diarrhea: Secondary | ICD-10-CM | POA: Diagnosis not present

## 2015-12-21 DIAGNOSIS — N189 Chronic kidney disease, unspecified: Secondary | ICD-10-CM | POA: Diagnosis not present

## 2015-12-21 DIAGNOSIS — T814XXD Infection following a procedure, subsequent encounter: Secondary | ICD-10-CM | POA: Diagnosis not present

## 2015-12-21 DIAGNOSIS — K219 Gastro-esophageal reflux disease without esophagitis: Secondary | ICD-10-CM | POA: Diagnosis not present

## 2015-12-21 DIAGNOSIS — I10 Essential (primary) hypertension: Secondary | ICD-10-CM | POA: Diagnosis not present

## 2015-12-22 ENCOUNTER — Encounter: Payer: Self-pay | Admitting: Internal Medicine

## 2015-12-22 DIAGNOSIS — K219 Gastro-esophageal reflux disease without esophagitis: Secondary | ICD-10-CM | POA: Diagnosis not present

## 2015-12-22 DIAGNOSIS — I509 Heart failure, unspecified: Secondary | ICD-10-CM | POA: Diagnosis not present

## 2015-12-22 DIAGNOSIS — T814XXD Infection following a procedure, subsequent encounter: Secondary | ICD-10-CM | POA: Diagnosis not present

## 2015-12-22 DIAGNOSIS — M150 Primary generalized (osteo)arthritis: Secondary | ICD-10-CM | POA: Diagnosis not present

## 2015-12-22 DIAGNOSIS — K589 Irritable bowel syndrome without diarrhea: Secondary | ICD-10-CM | POA: Diagnosis not present

## 2015-12-22 DIAGNOSIS — I10 Essential (primary) hypertension: Secondary | ICD-10-CM | POA: Diagnosis not present

## 2015-12-23 ENCOUNTER — Emergency Department (HOSPITAL_COMMUNITY)
Admission: EM | Admit: 2015-12-23 | Discharge: 2015-12-23 | Disposition: A | Discharge status: Home / Self Care | Payer: Medicare Other | Attending: Emergency Medicine | Admitting: Emergency Medicine

## 2015-12-23 ENCOUNTER — Encounter (HOSPITAL_COMMUNITY): Payer: Self-pay

## 2015-12-23 DIAGNOSIS — I509 Heart failure, unspecified: Secondary | ICD-10-CM | POA: Diagnosis not present

## 2015-12-23 DIAGNOSIS — Z8701 Personal history of pneumonia (recurrent): Secondary | ICD-10-CM | POA: Insufficient documentation

## 2015-12-23 DIAGNOSIS — F419 Anxiety disorder, unspecified: Secondary | ICD-10-CM | POA: Diagnosis not present

## 2015-12-23 DIAGNOSIS — R112 Nausea with vomiting, unspecified: Secondary | ICD-10-CM | POA: Diagnosis not present

## 2015-12-23 DIAGNOSIS — I10 Essential (primary) hypertension: Secondary | ICD-10-CM | POA: Diagnosis not present

## 2015-12-23 DIAGNOSIS — K219 Gastro-esophageal reflux disease without esophagitis: Secondary | ICD-10-CM | POA: Insufficient documentation

## 2015-12-23 DIAGNOSIS — Q6100 Congenital renal cyst, unspecified: Secondary | ICD-10-CM | POA: Diagnosis not present

## 2015-12-23 DIAGNOSIS — Z79899 Other long term (current) drug therapy: Secondary | ICD-10-CM | POA: Insufficient documentation

## 2015-12-23 DIAGNOSIS — G8929 Other chronic pain: Secondary | ICD-10-CM | POA: Insufficient documentation

## 2015-12-23 DIAGNOSIS — R109 Unspecified abdominal pain: Secondary | ICD-10-CM | POA: Diagnosis present

## 2015-12-23 DIAGNOSIS — E7800 Pure hypercholesterolemia, unspecified: Secondary | ICD-10-CM | POA: Insufficient documentation

## 2015-12-23 DIAGNOSIS — Z86018 Personal history of other benign neoplasm: Secondary | ICD-10-CM | POA: Diagnosis not present

## 2015-12-23 DIAGNOSIS — Z8619 Personal history of other infectious and parasitic diseases: Secondary | ICD-10-CM | POA: Diagnosis not present

## 2015-12-23 DIAGNOSIS — M1990 Unspecified osteoarthritis, unspecified site: Secondary | ICD-10-CM | POA: Diagnosis not present

## 2015-12-23 LAB — URINALYSIS, ROUTINE W REFLEX MICROSCOPIC
BILIRUBIN URINE: NEGATIVE
Glucose, UA: NEGATIVE mg/dL
Hgb urine dipstick: NEGATIVE
KETONES UR: 15 mg/dL — AB
LEUKOCYTES UA: NEGATIVE
NITRITE: NEGATIVE
PH: 8 (ref 5.0–8.0)
Protein, ur: 30 mg/dL — AB
Specific Gravity, Urine: 1.021 (ref 1.005–1.030)

## 2015-12-23 LAB — COMPREHENSIVE METABOLIC PANEL
ALK PHOS: 68 U/L (ref 38–126)
ALT: 11 U/L — ABNORMAL LOW (ref 14–54)
ANION GAP: 15 (ref 5–15)
AST: 20 U/L (ref 15–41)
Albumin: 3.1 g/dL — ABNORMAL LOW (ref 3.5–5.0)
BILIRUBIN TOTAL: 0.7 mg/dL (ref 0.3–1.2)
CALCIUM: 9.5 mg/dL (ref 8.9–10.3)
CO2: 19 mmol/L — ABNORMAL LOW (ref 22–32)
Chloride: 107 mmol/L (ref 101–111)
Creatinine, Ser: 0.89 mg/dL (ref 0.44–1.00)
GFR calc Af Amer: >60 mL/min (ref >60)
GFR calc non Af Amer: >60 mL/min (ref >60)
Glucose, Bld: 157 mg/dL — ABNORMAL HIGH (ref 65–99)
POTASSIUM: 3.6 mmol/L (ref 3.5–5.1)
Sodium: 141 mmol/L (ref 135–145)
TOTAL PROTEIN: 7.8 g/dL (ref 6.5–8.1)

## 2015-12-23 LAB — URINE MICROSCOPIC-ADD ON

## 2015-12-23 LAB — CBC
HEMATOCRIT: 38.6 % (ref 36.0–46.0)
HEMOGLOBIN: 12.4 g/dL (ref 12.0–15.0)
MCH: 26.9 pg (ref 26.0–34.0)
MCHC: 32.1 g/dL (ref 30.0–36.0)
MCV: 83.7 fL (ref 78.0–100.0)
Platelets: 290 10*3/uL (ref 150–400)
RBC: 4.61 MIL/uL (ref 3.87–5.11)
RDW: 14.8 % (ref 11.5–15.5)
WBC: 3.7 10*3/uL — AB (ref 4.0–10.5)

## 2015-12-23 LAB — LIPASE, BLOOD: Lipase: 58 U/L — ABNORMAL HIGH (ref 11–51)

## 2015-12-23 LAB — I-STAT CG4 LACTIC ACID, ED
LACTIC ACID, VENOUS: 2.37 mmol/L — AB (ref 0.5–2.0)
LACTIC ACID, VENOUS: 1.27 mmol/L (ref 0.5–2.0)
Lactic Acid, Venous: 2.63 mmol/L (ref 0.5–2.0)

## 2015-12-23 MED ORDER — SODIUM CHLORIDE 0.9 % IV BOLUS (SEPSIS)
1000.0000 mL | Freq: Once | INTRAVENOUS | Status: AC
  Administered 2015-12-23: 1000 mL via INTRAVENOUS

## 2015-12-23 MED ORDER — OXYCODONE-ACETAMINOPHEN 5-325 MG PO TABS
2.0000 | ORAL_TABLET | Freq: Once | ORAL | Status: DC
  Filled 2015-12-23: qty 2

## 2015-12-23 MED ORDER — PROCHLORPERAZINE MALEATE 10 MG PO TABS: 10.0000 mg | ORAL_TABLET | Freq: Two times a day (BID) | ORAL | Status: DC | PRN

## 2015-12-23 MED ORDER — MORPHINE SULFATE (PF) 4 MG/ML IV SOLN
4.0000 mg | Freq: Once | INTRAVENOUS | Status: AC
  Administered 2015-12-23: 4 mg via INTRAVENOUS
  Filled 2015-12-23: qty 1

## 2015-12-23 MED ORDER — METOCLOPRAMIDE HCL 5 MG/ML IJ SOLN
10.0000 mg | Freq: Once | INTRAMUSCULAR | Status: AC
  Administered 2015-12-23: 10 mg via INTRAVENOUS
  Filled 2015-12-23: qty 2

## 2015-12-23 MED ORDER — OXYCODONE-ACETAMINOPHEN 5-325 MG PO TABS: 1.0000 | ORAL_TABLET | Freq: Four times a day (QID) | ORAL | Status: DC | PRN

## 2015-12-23 NOTE — ED Provider Notes (Signed)
Patient seen/examined in the Emergency Department in conjunction with Midlevel Provider [facility] Patient reports abdominal pain and vomiting/diarrhea Exam : awake/alert, diffuse abdominal tenderness, no rebound or guarding Plan: pt with multiple medical problems reporting abd pain and vomiting/diarrhea Plan will be to give IV fluids, treat pain and reassess Pt currently stable If her pain does not improve may need CT abd/pelvis    Ripley Fraise, MD 12/23/15 1513

## 2015-12-23 NOTE — ED Provider Notes (Signed)
Patient signed out to me at shift change.  Patient with abdominal cramping, nausea and vomiting.  Was recently admitted for the same.  Had a negative CT about 3 weeks ago. She states that she is feeling improved now.  Plan is to recheck lactate and reassess.  5:31 PM Lactate increased slightly.  The patient does not appear toxic.  Will give another liter of fluid and reassess.  She states that she actually is feeling much better and asks for something to drink.  Will repeat lactate after another liter of fluid.  If it doesn't clear at that point, will get CT.  6:48 PM Patient is still feeling much better. Lactate is trending down. She is tolerating oral intake. Requests to be discharged. I'm agreeable. No focal abdominal tenderness. Strict return precautions given. Patient understands and agrees with the plan. She is stable and ready for discharge.  Montine Circle, PA-C 12/23/15 [place], MD 12/24/15 [phone]

## 2015-12-23 NOTE — ED Notes (Signed)
Pt tolerating orange juice
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
## 2015-12-23 NOTE — Discharge Instructions (Signed)

## 2015-12-23 NOTE — ED Notes (Addendum)
Patient here with lower abdominal cramping /pain since early am, diarrhea with same and nausea. Arrived with wound vac to spinal infection from surgery in march. Has not had BP meds this am

## 2015-12-25 DIAGNOSIS — T814XXD Infection following a procedure, subsequent encounter: Secondary | ICD-10-CM | POA: Diagnosis not present

## 2015-12-25 DIAGNOSIS — K219 Gastro-esophageal reflux disease without esophagitis: Secondary | ICD-10-CM | POA: Diagnosis not present

## 2015-12-25 DIAGNOSIS — M150 Primary generalized (osteo)arthritis: Secondary | ICD-10-CM | POA: Diagnosis not present

## 2015-12-25 DIAGNOSIS — I509 Heart failure, unspecified: Secondary | ICD-10-CM | POA: Diagnosis not present

## 2015-12-25 DIAGNOSIS — K589 Irritable bowel syndrome without diarrhea: Secondary | ICD-10-CM | POA: Diagnosis not present

## 2015-12-25 DIAGNOSIS — I10 Essential (primary) hypertension: Secondary | ICD-10-CM | POA: Diagnosis not present

## 2015-12-26 DIAGNOSIS — I10 Essential (primary) hypertension: Secondary | ICD-10-CM | POA: Diagnosis not present

## 2015-12-26 DIAGNOSIS — K589 Irritable bowel syndrome without diarrhea: Secondary | ICD-10-CM | POA: Diagnosis not present

## 2015-12-26 DIAGNOSIS — M150 Primary generalized (osteo)arthritis: Secondary | ICD-10-CM | POA: Diagnosis not present

## 2015-12-26 DIAGNOSIS — T814XXD Infection following a procedure, subsequent encounter: Secondary | ICD-10-CM | POA: Diagnosis not present

## 2015-12-26 DIAGNOSIS — K219 Gastro-esophageal reflux disease without esophagitis: Secondary | ICD-10-CM | POA: Diagnosis not present

## 2015-12-26 DIAGNOSIS — I509 Heart failure, unspecified: Secondary | ICD-10-CM | POA: Diagnosis not present

## 2015-12-27 DIAGNOSIS — I10 Essential (primary) hypertension: Secondary | ICD-10-CM | POA: Diagnosis not present

## 2015-12-27 DIAGNOSIS — M150 Primary generalized (osteo)arthritis: Secondary | ICD-10-CM | POA: Diagnosis not present

## 2015-12-27 DIAGNOSIS — I509 Heart failure, unspecified: Secondary | ICD-10-CM | POA: Diagnosis not present

## 2015-12-27 DIAGNOSIS — K589 Irritable bowel syndrome without diarrhea: Secondary | ICD-10-CM | POA: Diagnosis not present

## 2015-12-27 DIAGNOSIS — T814XXD Infection following a procedure, subsequent encounter: Secondary | ICD-10-CM | POA: Diagnosis not present

## 2015-12-27 DIAGNOSIS — K219 Gastro-esophageal reflux disease without esophagitis: Secondary | ICD-10-CM | POA: Diagnosis not present

## 2015-12-28 DIAGNOSIS — K589 Irritable bowel syndrome without diarrhea: Secondary | ICD-10-CM | POA: Diagnosis not present

## 2015-12-28 DIAGNOSIS — I10 Essential (primary) hypertension: Secondary | ICD-10-CM | POA: Diagnosis not present

## 2015-12-28 DIAGNOSIS — M150 Primary generalized (osteo)arthritis: Secondary | ICD-10-CM | POA: Diagnosis not present

## 2015-12-28 DIAGNOSIS — T814XXD Infection following a procedure, subsequent encounter: Secondary | ICD-10-CM | POA: Diagnosis not present

## 2015-12-28 DIAGNOSIS — K219 Gastro-esophageal reflux disease without esophagitis: Secondary | ICD-10-CM | POA: Diagnosis not present

## 2015-12-28 DIAGNOSIS — I509 Heart failure, unspecified: Secondary | ICD-10-CM | POA: Diagnosis not present

## 2015-12-29 DIAGNOSIS — K219 Gastro-esophageal reflux disease without esophagitis: Secondary | ICD-10-CM | POA: Diagnosis not present

## 2015-12-29 DIAGNOSIS — T814XXD Infection following a procedure, subsequent encounter: Secondary | ICD-10-CM | POA: Diagnosis not present

## 2015-12-29 DIAGNOSIS — I10 Essential (primary) hypertension: Secondary | ICD-10-CM | POA: Diagnosis not present

## 2015-12-29 DIAGNOSIS — I509 Heart failure, unspecified: Secondary | ICD-10-CM | POA: Diagnosis not present

## 2015-12-29 DIAGNOSIS — K589 Irritable bowel syndrome without diarrhea: Secondary | ICD-10-CM | POA: Diagnosis not present

## 2015-12-29 DIAGNOSIS — M150 Primary generalized (osteo)arthritis: Secondary | ICD-10-CM | POA: Diagnosis not present

## 2016-01-01 ENCOUNTER — Inpatient Hospital Stay: Payer: Self-pay | Admitting: Internal Medicine

## 2016-01-01 DIAGNOSIS — M150 Primary generalized (osteo)arthritis: Secondary | ICD-10-CM | POA: Diagnosis not present

## 2016-01-01 DIAGNOSIS — K219 Gastro-esophageal reflux disease without esophagitis: Secondary | ICD-10-CM | POA: Diagnosis not present

## 2016-01-01 DIAGNOSIS — T814XXD Infection following a procedure, subsequent encounter: Secondary | ICD-10-CM | POA: Diagnosis not present

## 2016-01-01 DIAGNOSIS — I509 Heart failure, unspecified: Secondary | ICD-10-CM | POA: Diagnosis not present

## 2016-01-01 DIAGNOSIS — K589 Irritable bowel syndrome without diarrhea: Secondary | ICD-10-CM | POA: Diagnosis not present

## 2016-01-01 DIAGNOSIS — I10 Essential (primary) hypertension: Secondary | ICD-10-CM | POA: Diagnosis not present

## 2016-01-02 DIAGNOSIS — I509 Heart failure, unspecified: Secondary | ICD-10-CM | POA: Diagnosis not present

## 2016-01-02 DIAGNOSIS — K589 Irritable bowel syndrome without diarrhea: Secondary | ICD-10-CM | POA: Diagnosis not present

## 2016-01-02 DIAGNOSIS — T814XXD Infection following a procedure, subsequent encounter: Secondary | ICD-10-CM | POA: Diagnosis not present

## 2016-01-02 DIAGNOSIS — I10 Essential (primary) hypertension: Secondary | ICD-10-CM | POA: Diagnosis not present

## 2016-01-02 DIAGNOSIS — K219 Gastro-esophageal reflux disease without esophagitis: Secondary | ICD-10-CM | POA: Diagnosis not present

## 2016-01-02 DIAGNOSIS — M150 Primary generalized (osteo)arthritis: Secondary | ICD-10-CM | POA: Diagnosis not present

## 2016-01-03 DIAGNOSIS — M150 Primary generalized (osteo)arthritis: Secondary | ICD-10-CM | POA: Diagnosis not present

## 2016-01-03 DIAGNOSIS — K219 Gastro-esophageal reflux disease without esophagitis: Secondary | ICD-10-CM | POA: Diagnosis not present

## 2016-01-03 DIAGNOSIS — K589 Irritable bowel syndrome without diarrhea: Secondary | ICD-10-CM | POA: Diagnosis not present

## 2016-01-03 DIAGNOSIS — I509 Heart failure, unspecified: Secondary | ICD-10-CM | POA: Diagnosis not present

## 2016-01-03 DIAGNOSIS — T814XXD Infection following a procedure, subsequent encounter: Secondary | ICD-10-CM | POA: Diagnosis not present

## 2016-01-03 DIAGNOSIS — I10 Essential (primary) hypertension: Secondary | ICD-10-CM | POA: Diagnosis not present

## 2016-01-04 DIAGNOSIS — I10 Essential (primary) hypertension: Secondary | ICD-10-CM | POA: Diagnosis not present

## 2016-01-04 DIAGNOSIS — I509 Heart failure, unspecified: Secondary | ICD-10-CM | POA: Diagnosis not present

## 2016-01-04 DIAGNOSIS — T814XXD Infection following a procedure, subsequent encounter: Secondary | ICD-10-CM | POA: Diagnosis not present

## 2016-01-04 DIAGNOSIS — M150 Primary generalized (osteo)arthritis: Secondary | ICD-10-CM | POA: Diagnosis not present

## 2016-01-04 DIAGNOSIS — K219 Gastro-esophageal reflux disease without esophagitis: Secondary | ICD-10-CM | POA: Diagnosis not present

## 2016-01-04 DIAGNOSIS — K589 Irritable bowel syndrome without diarrhea: Secondary | ICD-10-CM | POA: Diagnosis not present

## 2016-01-05 DIAGNOSIS — K219 Gastro-esophageal reflux disease without esophagitis: Secondary | ICD-10-CM | POA: Diagnosis not present

## 2016-01-05 DIAGNOSIS — I10 Essential (primary) hypertension: Secondary | ICD-10-CM | POA: Diagnosis not present

## 2016-01-05 DIAGNOSIS — K589 Irritable bowel syndrome without diarrhea: Secondary | ICD-10-CM | POA: Diagnosis not present

## 2016-01-05 DIAGNOSIS — I509 Heart failure, unspecified: Secondary | ICD-10-CM | POA: Diagnosis not present

## 2016-01-05 DIAGNOSIS — T814XXD Infection following a procedure, subsequent encounter: Secondary | ICD-10-CM | POA: Diagnosis not present

## 2016-01-05 DIAGNOSIS — M150 Primary generalized (osteo)arthritis: Secondary | ICD-10-CM | POA: Diagnosis not present

## 2016-01-08 DIAGNOSIS — I10 Essential (primary) hypertension: Secondary | ICD-10-CM | POA: Diagnosis not present

## 2016-01-08 DIAGNOSIS — M150 Primary generalized (osteo)arthritis: Secondary | ICD-10-CM | POA: Diagnosis not present

## 2016-01-08 DIAGNOSIS — K219 Gastro-esophageal reflux disease without esophagitis: Secondary | ICD-10-CM | POA: Diagnosis not present

## 2016-01-08 DIAGNOSIS — I509 Heart failure, unspecified: Secondary | ICD-10-CM | POA: Diagnosis not present

## 2016-01-08 DIAGNOSIS — K589 Irritable bowel syndrome without diarrhea: Secondary | ICD-10-CM | POA: Diagnosis not present

## 2016-01-08 DIAGNOSIS — T814XXD Infection following a procedure, subsequent encounter: Secondary | ICD-10-CM | POA: Diagnosis not present

## 2016-01-09 DIAGNOSIS — M150 Primary generalized (osteo)arthritis: Secondary | ICD-10-CM | POA: Diagnosis not present

## 2016-01-09 DIAGNOSIS — I10 Essential (primary) hypertension: Secondary | ICD-10-CM | POA: Diagnosis not present

## 2016-01-09 DIAGNOSIS — I5041 Acute combined systolic (congestive) and diastolic (congestive) heart failure: Secondary | ICD-10-CM | POA: Diagnosis not present

## 2016-01-09 DIAGNOSIS — G894 Chronic pain syndrome: Secondary | ICD-10-CM | POA: Diagnosis not present

## 2016-01-09 DIAGNOSIS — K219 Gastro-esophageal reflux disease without esophagitis: Secondary | ICD-10-CM | POA: Diagnosis not present

## 2016-01-09 DIAGNOSIS — T814XXD Infection following a procedure, subsequent encounter: Secondary | ICD-10-CM | POA: Diagnosis not present

## 2016-01-09 DIAGNOSIS — F419 Anxiety disorder, unspecified: Secondary | ICD-10-CM | POA: Diagnosis not present

## 2016-01-09 DIAGNOSIS — Z8701 Personal history of pneumonia (recurrent): Secondary | ICD-10-CM | POA: Diagnosis not present

## 2016-01-09 DIAGNOSIS — K589 Irritable bowel syndrome without diarrhea: Secondary | ICD-10-CM | POA: Diagnosis not present

## 2016-01-09 DIAGNOSIS — E785 Hyperlipidemia, unspecified: Secondary | ICD-10-CM | POA: Diagnosis not present

## 2016-01-10 DIAGNOSIS — T814XXD Infection following a procedure, subsequent encounter: Secondary | ICD-10-CM | POA: Diagnosis not present

## 2016-01-10 DIAGNOSIS — G894 Chronic pain syndrome: Secondary | ICD-10-CM | POA: Diagnosis not present

## 2016-01-10 DIAGNOSIS — I5041 Acute combined systolic (congestive) and diastolic (congestive) heart failure: Secondary | ICD-10-CM | POA: Diagnosis not present

## 2016-01-10 DIAGNOSIS — M4806 Spinal stenosis, lumbar region: Secondary | ICD-10-CM | POA: Diagnosis not present

## 2016-01-10 DIAGNOSIS — I10 Essential (primary) hypertension: Secondary | ICD-10-CM | POA: Diagnosis not present

## 2016-01-10 DIAGNOSIS — M150 Primary generalized (osteo)arthritis: Secondary | ICD-10-CM | POA: Diagnosis not present

## 2016-01-10 DIAGNOSIS — K589 Irritable bowel syndrome without diarrhea: Secondary | ICD-10-CM | POA: Diagnosis not present

## 2016-01-10 DIAGNOSIS — M4316 Spondylolisthesis, lumbar region: Secondary | ICD-10-CM | POA: Diagnosis not present

## 2016-01-10 NOTE — Progress Notes (Signed)
Late entry for missed gcode    11/03/15 1047  OT Time Calculation  OT Start Time (ACUTE ONLY) 1019  OT Stop Time (ACUTE ONLY) 1046  OT Time Calculation (min) 27 min  OT G-codes **NOT FOR INPATIENT CLASS**  Functional Assessment Tool Used Clinical judgement  Functional Limitation Self care  Self Care Current Status [id number]) CK  Self Care Goal Status [id number]) CK  Self Care Discharge Status [id number]) CK  OT General Charges  $OT Visit 1 Procedure  OT Evaluation  $OT Eval Moderate Complexity 1 Procedure  OT Treatments  $Self Care/Home Management  8-22 mins   Truman Hayward M.S., OTR/L Pager: [phone]

## 2016-01-11 DIAGNOSIS — M150 Primary generalized (osteo)arthritis: Secondary | ICD-10-CM | POA: Diagnosis not present

## 2016-01-11 DIAGNOSIS — I5041 Acute combined systolic (congestive) and diastolic (congestive) heart failure: Secondary | ICD-10-CM | POA: Diagnosis not present

## 2016-01-11 DIAGNOSIS — K589 Irritable bowel syndrome without diarrhea: Secondary | ICD-10-CM | POA: Diagnosis not present

## 2016-01-11 DIAGNOSIS — I10 Essential (primary) hypertension: Secondary | ICD-10-CM | POA: Diagnosis not present

## 2016-01-11 DIAGNOSIS — T814XXD Infection following a procedure, subsequent encounter: Secondary | ICD-10-CM | POA: Diagnosis not present

## 2016-01-11 DIAGNOSIS — T148 Other injury of unspecified body region: Secondary | ICD-10-CM | POA: Diagnosis not present

## 2016-01-11 DIAGNOSIS — G894 Chronic pain syndrome: Secondary | ICD-10-CM | POA: Diagnosis not present

## 2016-01-12 ENCOUNTER — Other Ambulatory Visit (HOSPITAL_COMMUNITY): Payer: Self-pay | Admitting: Neurological Surgery

## 2016-01-12 ENCOUNTER — Other Ambulatory Visit: Payer: Self-pay | Admitting: Neurological Surgery

## 2016-01-12 ENCOUNTER — Ambulatory Visit (HOSPITAL_COMMUNITY)
Admission: RE | Admit: 2016-01-12 | Discharge: 2016-01-12 | Disposition: A | Discharge status: Home / Self Care | Payer: Medicare Other | Source: Ambulatory Visit | Attending: Neurological Surgery | Admitting: Neurological Surgery

## 2016-01-12 DIAGNOSIS — T814XXA Infection following a procedure, initial encounter: Principal | ICD-10-CM

## 2016-01-12 DIAGNOSIS — IMO0001 Reserved for inherently not codable concepts without codable children: Secondary | ICD-10-CM

## 2016-01-12 DIAGNOSIS — G894 Chronic pain syndrome: Secondary | ICD-10-CM | POA: Diagnosis not present

## 2016-01-12 DIAGNOSIS — I10 Essential (primary) hypertension: Secondary | ICD-10-CM | POA: Diagnosis not present

## 2016-01-12 DIAGNOSIS — X58XXXA Exposure to other specified factors, initial encounter: Secondary | ICD-10-CM | POA: Insufficient documentation

## 2016-01-12 DIAGNOSIS — S31000A Unspecified open wound of lower back and pelvis without penetration into retroperitoneum, initial encounter: Secondary | ICD-10-CM | POA: Diagnosis not present

## 2016-01-12 DIAGNOSIS — K589 Irritable bowel syndrome without diarrhea: Secondary | ICD-10-CM | POA: Diagnosis not present

## 2016-01-12 DIAGNOSIS — Z452 Encounter for adjustment and management of vascular access device: Secondary | ICD-10-CM | POA: Diagnosis not present

## 2016-01-12 DIAGNOSIS — T814XXD Infection following a procedure, subsequent encounter: Secondary | ICD-10-CM | POA: Diagnosis not present

## 2016-01-12 DIAGNOSIS — I5041 Acute combined systolic (congestive) and diastolic (congestive) heart failure: Secondary | ICD-10-CM | POA: Diagnosis not present

## 2016-01-12 DIAGNOSIS — M150 Primary generalized (osteo)arthritis: Secondary | ICD-10-CM | POA: Diagnosis not present

## 2016-01-12 MED ORDER — LIDOCAINE HCL 1 % IJ SOLN
INTRAMUSCULAR | Status: DC | PRN
  Administered 2016-01-12: 5 mL via INTRADERMAL

## 2016-01-12 MED ORDER — LIDOCAINE HCL 1 % IJ SOLN
INTRAMUSCULAR | Status: AC
  Filled 2016-01-12: qty 20

## 2016-01-12 MED ORDER — HEPARIN SOD (PORK) LOCK FLUSH 100 UNIT/ML IV SOLN
INTRAVENOUS | Status: AC
  Filled 2016-01-12: qty 5

## 2016-01-12 NOTE — Procedures (Addendum)
US/fluoroscopic guided left brachial vein SL PICC placement. Length 46 cm. Tip SVC/RA junction. No immediate complications. Medication used- 1% lidocaine to skin/SQ tissue.

## 2016-01-13 DIAGNOSIS — M150 Primary generalized (osteo)arthritis: Secondary | ICD-10-CM | POA: Diagnosis not present

## 2016-01-13 DIAGNOSIS — I10 Essential (primary) hypertension: Secondary | ICD-10-CM | POA: Diagnosis not present

## 2016-01-13 DIAGNOSIS — G894 Chronic pain syndrome: Secondary | ICD-10-CM | POA: Diagnosis not present

## 2016-01-13 DIAGNOSIS — T814XXD Infection following a procedure, subsequent encounter: Secondary | ICD-10-CM | POA: Diagnosis not present

## 2016-01-13 DIAGNOSIS — K589 Irritable bowel syndrome without diarrhea: Secondary | ICD-10-CM | POA: Diagnosis not present

## 2016-01-13 DIAGNOSIS — I5041 Acute combined systolic (congestive) and diastolic (congestive) heart failure: Secondary | ICD-10-CM | POA: Diagnosis not present

## 2016-01-14 DIAGNOSIS — I10 Essential (primary) hypertension: Secondary | ICD-10-CM | POA: Diagnosis not present

## 2016-01-14 DIAGNOSIS — K589 Irritable bowel syndrome without diarrhea: Secondary | ICD-10-CM | POA: Diagnosis not present

## 2016-01-14 DIAGNOSIS — M150 Primary generalized (osteo)arthritis: Secondary | ICD-10-CM | POA: Diagnosis not present

## 2016-01-14 DIAGNOSIS — G894 Chronic pain syndrome: Secondary | ICD-10-CM | POA: Diagnosis not present

## 2016-01-14 DIAGNOSIS — T814XXD Infection following a procedure, subsequent encounter: Secondary | ICD-10-CM | POA: Diagnosis not present

## 2016-01-14 DIAGNOSIS — I5041 Acute combined systolic (congestive) and diastolic (congestive) heart failure: Secondary | ICD-10-CM | POA: Diagnosis not present

## 2016-01-15 DIAGNOSIS — I5041 Acute combined systolic (congestive) and diastolic (congestive) heart failure: Secondary | ICD-10-CM | POA: Diagnosis not present

## 2016-01-15 DIAGNOSIS — I10 Essential (primary) hypertension: Secondary | ICD-10-CM | POA: Diagnosis not present

## 2016-01-15 DIAGNOSIS — M150 Primary generalized (osteo)arthritis: Secondary | ICD-10-CM | POA: Diagnosis not present

## 2016-01-15 DIAGNOSIS — T814XXD Infection following a procedure, subsequent encounter: Secondary | ICD-10-CM | POA: Diagnosis not present

## 2016-01-15 DIAGNOSIS — K589 Irritable bowel syndrome without diarrhea: Secondary | ICD-10-CM | POA: Diagnosis not present

## 2016-01-15 DIAGNOSIS — G894 Chronic pain syndrome: Secondary | ICD-10-CM | POA: Diagnosis not present

## 2016-01-16 DIAGNOSIS — G894 Chronic pain syndrome: Secondary | ICD-10-CM | POA: Diagnosis not present

## 2016-01-16 DIAGNOSIS — T814XXD Infection following a procedure, subsequent encounter: Secondary | ICD-10-CM | POA: Diagnosis not present

## 2016-01-16 DIAGNOSIS — I5041 Acute combined systolic (congestive) and diastolic (congestive) heart failure: Secondary | ICD-10-CM | POA: Diagnosis not present

## 2016-01-16 DIAGNOSIS — M150 Primary generalized (osteo)arthritis: Secondary | ICD-10-CM | POA: Diagnosis not present

## 2016-01-16 DIAGNOSIS — I10 Essential (primary) hypertension: Secondary | ICD-10-CM | POA: Diagnosis not present

## 2016-01-16 DIAGNOSIS — K589 Irritable bowel syndrome without diarrhea: Secondary | ICD-10-CM | POA: Diagnosis not present

## 2016-01-16 DIAGNOSIS — Z792 Long term (current) use of antibiotics: Secondary | ICD-10-CM | POA: Diagnosis not present

## 2016-01-17 DIAGNOSIS — G894 Chronic pain syndrome: Secondary | ICD-10-CM | POA: Diagnosis not present

## 2016-01-17 DIAGNOSIS — I10 Essential (primary) hypertension: Secondary | ICD-10-CM | POA: Diagnosis not present

## 2016-01-17 DIAGNOSIS — I5041 Acute combined systolic (congestive) and diastolic (congestive) heart failure: Secondary | ICD-10-CM | POA: Diagnosis not present

## 2016-01-17 DIAGNOSIS — M150 Primary generalized (osteo)arthritis: Secondary | ICD-10-CM | POA: Diagnosis not present

## 2016-01-17 DIAGNOSIS — K589 Irritable bowel syndrome without diarrhea: Secondary | ICD-10-CM | POA: Diagnosis not present

## 2016-01-17 DIAGNOSIS — T814XXD Infection following a procedure, subsequent encounter: Secondary | ICD-10-CM | POA: Diagnosis not present

## 2016-01-19 ENCOUNTER — Telehealth: Payer: Self-pay | Admitting: *Deleted

## 2016-01-19 DIAGNOSIS — K589 Irritable bowel syndrome without diarrhea: Secondary | ICD-10-CM | POA: Diagnosis not present

## 2016-01-19 DIAGNOSIS — T814XXD Infection following a procedure, subsequent encounter: Secondary | ICD-10-CM | POA: Diagnosis not present

## 2016-01-19 DIAGNOSIS — M150 Primary generalized (osteo)arthritis: Secondary | ICD-10-CM | POA: Diagnosis not present

## 2016-01-19 DIAGNOSIS — I5041 Acute combined systolic (congestive) and diastolic (congestive) heart failure: Secondary | ICD-10-CM | POA: Diagnosis not present

## 2016-01-19 DIAGNOSIS — I10 Essential (primary) hypertension: Secondary | ICD-10-CM | POA: Diagnosis not present

## 2016-01-19 DIAGNOSIS — G894 Chronic pain syndrome: Secondary | ICD-10-CM | POA: Diagnosis not present

## 2016-01-19 NOTE — Telephone Encounter (Signed)
Call from patient's home health for verbal order to go out to the home to assess patient's picc line. Verbal order per Dr. Linus Salmons given. Patient called RN c/o bleeding at the site and thought she may have pulled on it when sleeping. [name]

## 2016-01-23 DIAGNOSIS — I5041 Acute combined systolic (congestive) and diastolic (congestive) heart failure: Secondary | ICD-10-CM | POA: Diagnosis not present

## 2016-01-23 DIAGNOSIS — M150 Primary generalized (osteo)arthritis: Secondary | ICD-10-CM | POA: Diagnosis not present

## 2016-01-23 DIAGNOSIS — I10 Essential (primary) hypertension: Secondary | ICD-10-CM | POA: Diagnosis not present

## 2016-01-23 DIAGNOSIS — K589 Irritable bowel syndrome without diarrhea: Secondary | ICD-10-CM | POA: Diagnosis not present

## 2016-01-23 DIAGNOSIS — T814XXD Infection following a procedure, subsequent encounter: Secondary | ICD-10-CM | POA: Diagnosis not present

## 2016-01-23 DIAGNOSIS — G894 Chronic pain syndrome: Secondary | ICD-10-CM | POA: Diagnosis not present

## 2016-01-30 DIAGNOSIS — M150 Primary generalized (osteo)arthritis: Secondary | ICD-10-CM | POA: Diagnosis not present

## 2016-01-30 DIAGNOSIS — I10 Essential (primary) hypertension: Secondary | ICD-10-CM | POA: Diagnosis not present

## 2016-01-30 DIAGNOSIS — I5041 Acute combined systolic (congestive) and diastolic (congestive) heart failure: Secondary | ICD-10-CM | POA: Diagnosis not present

## 2016-01-30 DIAGNOSIS — Z792 Long term (current) use of antibiotics: Secondary | ICD-10-CM | POA: Diagnosis not present

## 2016-01-30 DIAGNOSIS — T814XXD Infection following a procedure, subsequent encounter: Secondary | ICD-10-CM | POA: Diagnosis not present

## 2016-01-30 DIAGNOSIS — G894 Chronic pain syndrome: Secondary | ICD-10-CM | POA: Diagnosis not present

## 2016-01-30 DIAGNOSIS — K589 Irritable bowel syndrome without diarrhea: Secondary | ICD-10-CM | POA: Diagnosis not present

## 2016-02-02 DIAGNOSIS — Z6841 Body Mass Index (BMI) 40.0 and over, adult: Secondary | ICD-10-CM | POA: Diagnosis not present

## 2016-02-02 DIAGNOSIS — T148 Other injury of unspecified body region: Secondary | ICD-10-CM | POA: Diagnosis not present

## 2016-02-02 DIAGNOSIS — I10 Essential (primary) hypertension: Secondary | ICD-10-CM | POA: Diagnosis not present

## 2016-02-06 DIAGNOSIS — Z792 Long term (current) use of antibiotics: Secondary | ICD-10-CM | POA: Diagnosis not present

## 2016-02-06 DIAGNOSIS — K219 Gastro-esophageal reflux disease without esophagitis: Secondary | ICD-10-CM | POA: Diagnosis not present

## 2016-02-06 DIAGNOSIS — I10 Essential (primary) hypertension: Secondary | ICD-10-CM | POA: Diagnosis not present

## 2016-02-06 DIAGNOSIS — I5041 Acute combined systolic (congestive) and diastolic (congestive) heart failure: Secondary | ICD-10-CM | POA: Diagnosis not present

## 2016-02-06 DIAGNOSIS — K589 Irritable bowel syndrome without diarrhea: Secondary | ICD-10-CM | POA: Diagnosis not present

## 2016-02-06 DIAGNOSIS — M150 Primary generalized (osteo)arthritis: Secondary | ICD-10-CM | POA: Diagnosis not present

## 2016-02-06 DIAGNOSIS — T814XXD Infection following a procedure, subsequent encounter: Secondary | ICD-10-CM | POA: Diagnosis not present

## 2016-02-06 DIAGNOSIS — G894 Chronic pain syndrome: Secondary | ICD-10-CM | POA: Diagnosis not present

## 2016-02-13 ENCOUNTER — Other Ambulatory Visit (HOSPITAL_COMMUNITY): Payer: Self-pay | Admitting: Neurological Surgery

## 2016-02-13 DIAGNOSIS — M150 Primary generalized (osteo)arthritis: Secondary | ICD-10-CM | POA: Diagnosis not present

## 2016-02-13 DIAGNOSIS — T8140XA Infection following a procedure, unspecified, initial encounter: Secondary | ICD-10-CM

## 2016-02-13 DIAGNOSIS — I5041 Acute combined systolic (congestive) and diastolic (congestive) heart failure: Secondary | ICD-10-CM | POA: Diagnosis not present

## 2016-02-13 DIAGNOSIS — G894 Chronic pain syndrome: Secondary | ICD-10-CM | POA: Diagnosis not present

## 2016-02-13 DIAGNOSIS — T814XXD Infection following a procedure, subsequent encounter: Secondary | ICD-10-CM | POA: Diagnosis not present

## 2016-02-13 DIAGNOSIS — K589 Irritable bowel syndrome without diarrhea: Secondary | ICD-10-CM | POA: Diagnosis not present

## 2016-02-13 DIAGNOSIS — I10 Essential (primary) hypertension: Secondary | ICD-10-CM | POA: Diagnosis not present

## 2016-02-14 ENCOUNTER — Ambulatory Visit (HOSPITAL_COMMUNITY)
Admission: RE | Admit: 2016-02-14 | Discharge: 2016-02-14 | Disposition: A | Discharge status: Home / Self Care | Payer: Medicare Other | Source: Ambulatory Visit | Attending: Neurological Surgery | Admitting: Neurological Surgery

## 2016-02-14 DIAGNOSIS — Z452 Encounter for adjustment and management of vascular access device: Secondary | ICD-10-CM | POA: Insufficient documentation

## 2016-02-14 DIAGNOSIS — T8140XA Infection following a procedure, unspecified, initial encounter: Secondary | ICD-10-CM

## 2016-02-14 DIAGNOSIS — T82898A Other specified complication of vascular prosthetic devices, implants and grafts, initial encounter: Secondary | ICD-10-CM | POA: Diagnosis not present

## 2016-02-14 MED ORDER — HEPARIN SOD (PORK) LOCK FLUSH 100 UNIT/ML IV SOLN
INTRAVENOUS | Status: AC
  Administered 2016-02-14: 500 [IU]
  Filled 2016-02-14: qty 5

## 2016-02-14 MED ORDER — CHLORHEXIDINE GLUCONATE 4 % EX LIQD
CUTANEOUS | Status: AC
  Administered 2016-02-14: 09:00:00 via TOPICAL
  Filled 2016-02-14: qty 15

## 2016-02-14 NOTE — Procedures (Signed)
Interventional Radiology Procedure Note  Procedure: PICC exchange.  New PICC is 46 cm SL, Power injectable and tip at cavoatrial jxn.   Complications: None  Estimated Blood Loss: 0  Recommendations: - Routine line care    Signed,  Criselda Peaches, MD  '

## 2016-02-15 DIAGNOSIS — M4806 Spinal stenosis, lumbar region: Secondary | ICD-10-CM | POA: Diagnosis not present

## 2016-02-15 DIAGNOSIS — T148 Other injury of unspecified body region: Secondary | ICD-10-CM | POA: Diagnosis not present

## 2016-02-16 DIAGNOSIS — Z792 Long term (current) use of antibiotics: Secondary | ICD-10-CM | POA: Diagnosis not present

## 2016-02-16 DIAGNOSIS — K589 Irritable bowel syndrome without diarrhea: Secondary | ICD-10-CM | POA: Diagnosis not present

## 2016-02-16 DIAGNOSIS — G894 Chronic pain syndrome: Secondary | ICD-10-CM | POA: Diagnosis not present

## 2016-02-16 DIAGNOSIS — T814XXD Infection following a procedure, subsequent encounter: Secondary | ICD-10-CM | POA: Diagnosis not present

## 2016-02-16 DIAGNOSIS — I10 Essential (primary) hypertension: Secondary | ICD-10-CM | POA: Diagnosis not present

## 2016-02-16 DIAGNOSIS — I5041 Acute combined systolic (congestive) and diastolic (congestive) heart failure: Secondary | ICD-10-CM | POA: Diagnosis not present

## 2016-02-16 DIAGNOSIS — M150 Primary generalized (osteo)arthritis: Secondary | ICD-10-CM | POA: Diagnosis not present

## 2016-02-21 DIAGNOSIS — Z792 Long term (current) use of antibiotics: Secondary | ICD-10-CM | POA: Diagnosis not present

## 2016-02-21 DIAGNOSIS — G894 Chronic pain syndrome: Secondary | ICD-10-CM | POA: Diagnosis not present

## 2016-02-21 DIAGNOSIS — I10 Essential (primary) hypertension: Secondary | ICD-10-CM | POA: Diagnosis not present

## 2016-02-21 DIAGNOSIS — I5041 Acute combined systolic (congestive) and diastolic (congestive) heart failure: Secondary | ICD-10-CM | POA: Diagnosis not present

## 2016-02-21 DIAGNOSIS — K589 Irritable bowel syndrome without diarrhea: Secondary | ICD-10-CM | POA: Diagnosis not present

## 2016-02-21 DIAGNOSIS — T814XXD Infection following a procedure, subsequent encounter: Secondary | ICD-10-CM | POA: Diagnosis not present

## 2016-02-21 DIAGNOSIS — M150 Primary generalized (osteo)arthritis: Secondary | ICD-10-CM | POA: Diagnosis not present

## 2016-02-22 DIAGNOSIS — I10 Essential (primary) hypertension: Secondary | ICD-10-CM | POA: Diagnosis not present

## 2016-02-22 DIAGNOSIS — K589 Irritable bowel syndrome without diarrhea: Secondary | ICD-10-CM | POA: Diagnosis not present

## 2016-02-22 DIAGNOSIS — I5041 Acute combined systolic (congestive) and diastolic (congestive) heart failure: Secondary | ICD-10-CM | POA: Diagnosis not present

## 2016-02-22 DIAGNOSIS — M150 Primary generalized (osteo)arthritis: Secondary | ICD-10-CM | POA: Diagnosis not present

## 2016-02-22 DIAGNOSIS — G894 Chronic pain syndrome: Secondary | ICD-10-CM | POA: Diagnosis not present

## 2016-02-22 DIAGNOSIS — T814XXD Infection following a procedure, subsequent encounter: Secondary | ICD-10-CM | POA: Diagnosis not present

## 2016-02-22 DIAGNOSIS — Z792 Long term (current) use of antibiotics: Secondary | ICD-10-CM | POA: Diagnosis not present

## 2016-02-27 DIAGNOSIS — I10 Essential (primary) hypertension: Secondary | ICD-10-CM | POA: Diagnosis not present

## 2016-02-27 DIAGNOSIS — Z792 Long term (current) use of antibiotics: Secondary | ICD-10-CM | POA: Diagnosis not present

## 2016-02-27 DIAGNOSIS — I5041 Acute combined systolic (congestive) and diastolic (congestive) heart failure: Secondary | ICD-10-CM | POA: Diagnosis not present

## 2016-02-27 DIAGNOSIS — K589 Irritable bowel syndrome without diarrhea: Secondary | ICD-10-CM | POA: Diagnosis not present

## 2016-02-27 DIAGNOSIS — T814XXD Infection following a procedure, subsequent encounter: Secondary | ICD-10-CM | POA: Diagnosis not present

## 2016-02-27 DIAGNOSIS — M150 Primary generalized (osteo)arthritis: Secondary | ICD-10-CM | POA: Diagnosis not present

## 2016-02-27 DIAGNOSIS — G894 Chronic pain syndrome: Secondary | ICD-10-CM | POA: Diagnosis not present

## 2016-02-28 ENCOUNTER — Telehealth: Payer: Self-pay

## 2016-02-28 ENCOUNTER — Encounter: Payer: Self-pay | Admitting: Infectious Disease

## 2016-02-28 ENCOUNTER — Ambulatory Visit (INDEPENDENT_AMBULATORY_CARE_PROVIDER_SITE_OTHER): Payer: Medicare Other | Admitting: Infectious Disease

## 2016-02-28 VITALS — BP 139/86 | HR 54 | Temp 98.5°F

## 2016-02-28 DIAGNOSIS — T814XXA Infection following a procedure, initial encounter: Secondary | ICD-10-CM

## 2016-02-28 DIAGNOSIS — T814XXD Infection following a procedure, subsequent encounter: Secondary | ICD-10-CM | POA: Diagnosis not present

## 2016-02-28 DIAGNOSIS — Z981 Arthrodesis status: Secondary | ICD-10-CM | POA: Diagnosis not present

## 2016-02-28 DIAGNOSIS — I5041 Acute combined systolic (congestive) and diastolic (congestive) heart failure: Secondary | ICD-10-CM | POA: Diagnosis not present

## 2016-02-28 DIAGNOSIS — M150 Primary generalized (osteo)arthritis: Secondary | ICD-10-CM | POA: Diagnosis not present

## 2016-02-28 DIAGNOSIS — B961 Klebsiella pneumoniae [K. pneumoniae] as the cause of diseases classified elsewhere: Secondary | ICD-10-CM

## 2016-02-28 DIAGNOSIS — G894 Chronic pain syndrome: Secondary | ICD-10-CM | POA: Diagnosis not present

## 2016-02-28 DIAGNOSIS — M4647 Discitis, unspecified, lumbosacral region: Secondary | ICD-10-CM | POA: Diagnosis not present

## 2016-02-28 DIAGNOSIS — I10 Essential (primary) hypertension: Secondary | ICD-10-CM | POA: Diagnosis not present

## 2016-02-28 DIAGNOSIS — K589 Irritable bowel syndrome without diarrhea: Secondary | ICD-10-CM | POA: Diagnosis not present

## 2016-02-28 DIAGNOSIS — A498 Other bacterial infections of unspecified site: Secondary | ICD-10-CM | POA: Insufficient documentation

## 2016-02-28 DIAGNOSIS — G062 Extradural and subdural abscess, unspecified: Secondary | ICD-10-CM

## 2016-02-28 DIAGNOSIS — B964 Proteus (mirabilis) (morganii) as the cause of diseases classified elsewhere: Secondary | ICD-10-CM | POA: Diagnosis not present

## 2016-02-28 DIAGNOSIS — IMO0001 Reserved for inherently not codable concepts without codable children: Secondary | ICD-10-CM

## 2016-02-28 HISTORY — DX: Extradural and subdural abscess, unspecified: G06.2

## 2016-02-28 HISTORY — DX: Other bacterial infections of unspecified site: A49.8

## 2016-02-28 MED ORDER — AMOXICILLIN-POT CLAVULANATE 875-125 MG PO TABS: 1.0000 | ORAL_TABLET | Freq: Two times a day (BID) | ORAL | Status: DC
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
## 2016-02-28 NOTE — Telephone Encounter (Signed)
[facility] nurse Amy,RN called office wanting orders. Dr. Tommy Medal order given to pull patient PICC. Amy, RN read back verbal order. Rodman Key, LPN

## 2016-02-29 NOTE — Progress Notes (Signed)
   12/07/15 1240  OT G-codes **NOT FOR INPATIENT CLASS**  Functional Assessment Tool Used Clinical chart review  Functional Limitation Self care  Self Care Current Status [phone]) CL  Self Care Goal Status [id number]) CI   Late entry for 12/07/15 by Golden Circle, OTR/L for Chrys Racer, OTR/L

## 2016-03-04 NOTE — Progress Notes (Signed)
Physical Therapy Addendum for G-codes    01-05-2016 1239  PT Visit Information  Last PT Received On 05-Jan-2016  PT G-Codes **NOT FOR INPATIENT CLASS**  Functional Assessment Tool Used clinical judgement  Functional Limitation Mobility: Walking and moving around  Mobility: Walking and Moving Around Current Status [phone]) CJ  Mobility: Walking and Moving Around Goal Status [phone]) CI    03/04/2016 Barry Brunner, PT Pager: [phone]

## 2016-03-06 DIAGNOSIS — T814XXD Infection following a procedure, subsequent encounter: Secondary | ICD-10-CM | POA: Diagnosis not present

## 2016-03-06 DIAGNOSIS — G894 Chronic pain syndrome: Secondary | ICD-10-CM | POA: Diagnosis not present

## 2016-03-06 DIAGNOSIS — I5041 Acute combined systolic (congestive) and diastolic (congestive) heart failure: Secondary | ICD-10-CM | POA: Diagnosis not present

## 2016-03-06 DIAGNOSIS — K589 Irritable bowel syndrome without diarrhea: Secondary | ICD-10-CM | POA: Diagnosis not present

## 2016-03-06 DIAGNOSIS — M150 Primary generalized (osteo)arthritis: Secondary | ICD-10-CM | POA: Diagnosis not present

## 2016-03-06 DIAGNOSIS — I10 Essential (primary) hypertension: Secondary | ICD-10-CM | POA: Diagnosis not present

## 2016-03-18 ENCOUNTER — Other Ambulatory Visit: Payer: Self-pay | Admitting: Internal Medicine

## 2016-03-18 DIAGNOSIS — Z1231 Encounter for screening mammogram for malignant neoplasm of breast: Secondary | ICD-10-CM

## 2016-03-18 DIAGNOSIS — M4316 Spondylolisthesis, lumbar region: Secondary | ICD-10-CM | POA: Diagnosis not present

## 2016-03-25 ENCOUNTER — Telehealth: Payer: Self-pay

## 2016-03-25 NOTE — Telephone Encounter (Signed)
If for yeast infection fine fort 150mg  x 1

## 2016-03-25 NOTE — Telephone Encounter (Signed)
Pharmacy sent a request stating patient is requesting a prescription of fluconazole. Please advise. Rodman Key, LPN

## 2016-03-26 DIAGNOSIS — H35352 Cystoid macular degeneration, left eye: Secondary | ICD-10-CM | POA: Diagnosis not present

## 2016-03-27 ENCOUNTER — Other Ambulatory Visit: Payer: Self-pay

## 2016-03-27 ENCOUNTER — Ambulatory Visit: Payer: Medicare Other | Attending: Internal Medicine | Admitting: Physical Therapy

## 2016-03-27 DIAGNOSIS — M62830 Muscle spasm of back: Secondary | ICD-10-CM | POA: Diagnosis not present

## 2016-03-27 DIAGNOSIS — M25561 Pain in right knee: Secondary | ICD-10-CM | POA: Insufficient documentation

## 2016-03-27 DIAGNOSIS — M25562 Pain in left knee: Secondary | ICD-10-CM | POA: Insufficient documentation

## 2016-03-27 DIAGNOSIS — R262 Difficulty in walking, not elsewhere classified: Secondary | ICD-10-CM | POA: Diagnosis not present

## 2016-03-27 DIAGNOSIS — M6281 Muscle weakness (generalized): Secondary | ICD-10-CM | POA: Diagnosis not present

## 2016-03-27 DIAGNOSIS — M545 Low back pain, unspecified: Secondary | ICD-10-CM

## 2016-03-27 DIAGNOSIS — B379 Candidiasis, unspecified: Secondary | ICD-10-CM

## 2016-03-27 DIAGNOSIS — B3731 Acute candidiasis of vulva and vagina: Secondary | ICD-10-CM

## 2016-03-27 DIAGNOSIS — B373 Candidiasis of vulva and vagina: Secondary | ICD-10-CM

## 2016-03-27 MED ORDER — FLUCONAZOLE 150 MG PO TABS: 150.0000 mg | ORAL_TABLET | Freq: Every day | ORAL | 0 refills | Status: DC

## 2016-03-27 NOTE — Telephone Encounter (Signed)
Called patient to see why she was requesting fluconazole. Patient stated she has a yeast infection from taking the prescribed antibiotics. Notified patient that a one time dose will be sent to her pharmacy.Patient stated understanding. Pharmacist Cassie reviewed prescription. Rodman Key, LPN

## 2016-03-28 ENCOUNTER — Encounter: Payer: Self-pay | Admitting: Physical Therapy
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
## 2016-04-02 ENCOUNTER — Ambulatory Visit: Payer: Self-pay | Admitting: Internal Medicine

## 2016-04-03 ENCOUNTER — Ambulatory Visit: Payer: Medicare Other | Admitting: Physical Therapy

## 2016-04-03 DIAGNOSIS — M25562 Pain in left knee: Secondary | ICD-10-CM

## 2016-04-03 DIAGNOSIS — M6281 Muscle weakness (generalized): Secondary | ICD-10-CM | POA: Diagnosis not present

## 2016-04-03 DIAGNOSIS — M25561 Pain in right knee: Secondary | ICD-10-CM | POA: Diagnosis not present

## 2016-04-03 DIAGNOSIS — M545 Low back pain, unspecified: Secondary | ICD-10-CM

## 2016-04-03 DIAGNOSIS — R262 Difficulty in walking, not elsewhere classified: Secondary | ICD-10-CM | POA: Diagnosis not present

## 2016-04-03 DIAGNOSIS — M62830 Muscle spasm of back: Secondary | ICD-10-CM | POA: Diagnosis not present
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
## 2016-04-09 ENCOUNTER — Ambulatory Visit: Payer: Medicare Other | Admitting: Physical Therapy

## 2016-04-09 DIAGNOSIS — M545 Low back pain, unspecified: Secondary | ICD-10-CM

## 2016-04-09 DIAGNOSIS — M25562 Pain in left knee: Secondary | ICD-10-CM

## 2016-04-09 DIAGNOSIS — M25561 Pain in right knee: Secondary | ICD-10-CM | POA: Diagnosis not present

## 2016-04-09 DIAGNOSIS — R262 Difficulty in walking, not elsewhere classified: Secondary | ICD-10-CM | POA: Diagnosis not present

## 2016-04-09 DIAGNOSIS — M6281 Muscle weakness (generalized): Secondary | ICD-10-CM

## 2016-04-09 DIAGNOSIS — M62830 Muscle spasm of back: Secondary | ICD-10-CM
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
## 2016-04-11 ENCOUNTER — Ambulatory Visit: Payer: Medicare Other | Admitting: Physical Therapy

## 2016-04-11 DIAGNOSIS — M25561 Pain in right knee: Secondary | ICD-10-CM

## 2016-04-11 DIAGNOSIS — M545 Low back pain, unspecified: Secondary | ICD-10-CM

## 2016-04-11 DIAGNOSIS — R262 Difficulty in walking, not elsewhere classified: Secondary | ICD-10-CM

## 2016-04-11 DIAGNOSIS — M6281 Muscle weakness (generalized): Secondary | ICD-10-CM

## 2016-04-11 DIAGNOSIS — M62830 Muscle spasm of back: Secondary | ICD-10-CM

## 2016-04-11 DIAGNOSIS — M25562 Pain in left knee: Secondary | ICD-10-CM
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
## 2016-04-15 ENCOUNTER — Ambulatory Visit (INDEPENDENT_AMBULATORY_CARE_PROVIDER_SITE_OTHER): Payer: Medicare Other | Admitting: Infectious Disease

## 2016-04-15 ENCOUNTER — Encounter: Payer: Self-pay | Admitting: Infectious Disease

## 2016-04-15 VITALS — HR 76 | Temp 98.3°F | Wt 242.0 lb

## 2016-04-15 DIAGNOSIS — M4647 Discitis, unspecified, lumbosacral region: Secondary | ICD-10-CM | POA: Diagnosis not present

## 2016-04-15 DIAGNOSIS — B964 Proteus (mirabilis) (morganii) as the cause of diseases classified elsewhere: Secondary | ICD-10-CM

## 2016-04-15 DIAGNOSIS — A498 Other bacterial infections of unspecified site: Secondary | ICD-10-CM

## 2016-04-15 DIAGNOSIS — L299 Pruritus, unspecified: Secondary | ICD-10-CM

## 2016-04-15 DIAGNOSIS — Z23 Encounter for immunization: Secondary | ICD-10-CM

## 2016-04-15 DIAGNOSIS — IMO0001 Reserved for inherently not codable concepts without codable children: Secondary | ICD-10-CM

## 2016-04-15 DIAGNOSIS — T814XXA Infection following a procedure, initial encounter: Secondary | ICD-10-CM | POA: Diagnosis not present

## 2016-04-15 DIAGNOSIS — B961 Klebsiella pneumoniae [K. pneumoniae] as the cause of diseases classified elsewhere: Secondary | ICD-10-CM

## 2016-04-15 DIAGNOSIS — Z981 Arthrodesis status: Secondary | ICD-10-CM | POA: Diagnosis not present

## 2016-04-15 HISTORY — DX: Pruritus, unspecified: L29.9

## 2016-04-15 LAB — CBC WITH DIFFERENTIAL/PLATELET
BASOS ABS: 0 {cells}/uL (ref 0–200)
BASOS PCT: 0 %
EOS ABS: 146 {cells}/uL (ref 15–500)
EOS PCT: 2 %
HCT: 39.4 % (ref 35.0–45.0)
Hemoglobin: 12.8 g/dL (ref 11.7–15.5)
LYMPHS PCT: 29 %
Lymphs Abs: 2117 cells/uL (ref 850–3900)
MCH: 26.2 pg — ABNORMAL LOW (ref 27.0–33.0)
MCHC: 32.5 g/dL (ref 32.0–36.0)
MCV: 80.6 fL (ref 80.0–100.0)
MONOS PCT: 25 %
MPV: 11.1 fL (ref 7.5–12.5)
Monocytes Absolute: 1825 cells/uL — ABNORMAL HIGH (ref 200–950)
NEUTROS ABS: 3212 {cells}/uL (ref 1500–7800)
Neutrophils Relative %: 44 %
Platelets: 206 10*3/uL (ref 140–400)
RBC: 4.89 MIL/uL (ref 3.80–5.10)
RDW: 16.4 % — ABNORMAL HIGH (ref 11.0–15.0)
WBC: 7.3 10*3/uL (ref 3.8–10.8)

## 2016-04-15 LAB — BASIC METABOLIC PANEL WITH GFR
BUN: 10 mg/dL (ref 7–25)
CALCIUM: 8.9 mg/dL (ref 8.6–10.4)
CO2: 25 mmol/L (ref 20–31)
CREATININE: 1.03 mg/dL — AB (ref 0.50–0.99)
Chloride: 105 mmol/L (ref 98–110)
GFR, EST AFRICAN AMERICAN: 65 mL/min (ref >=60)
GFR, EST NON AFRICAN AMERICAN: 56 mL/min — AB (ref >=60)
Glucose, Bld: 98 mg/dL (ref 65–99)
Potassium: 3.7 mmol/L (ref 3.5–5.3)
SODIUM: 141 mmol/L (ref 135–146)

## 2016-04-15 LAB — C-REACTIVE PROTEIN: CRP: 11 mg/dL — ABNORMAL HIGH (ref <0.60)

## 2016-04-15 MED ORDER — CEFPODOXIME PROXETIL 200 MG PO TABS: 400.0000 mg | ORAL_TABLET | Freq: Two times a day (BID) | ORAL | 5 refills | Status: DC
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
## 2016-04-16 ENCOUNTER — Ambulatory Visit: Payer: Medicare Other | Admitting: Physical Therapy

## 2016-04-16 LAB — SEDIMENTATION RATE: SED RATE: 63 mm/h — AB (ref 0–30)

## 2016-04-18 ENCOUNTER — Ambulatory Visit: Payer: Medicare Other | Admitting: Physical Therapy

## 2016-04-23 ENCOUNTER — Ambulatory Visit: Payer: Medicare Other | Attending: Internal Medicine | Admitting: Physical Therapy

## 2016-04-23 DIAGNOSIS — M25562 Pain in left knee: Secondary | ICD-10-CM | POA: Insufficient documentation

## 2016-04-23 DIAGNOSIS — R262 Difficulty in walking, not elsewhere classified: Secondary | ICD-10-CM | POA: Insufficient documentation

## 2016-04-23 DIAGNOSIS — M25561 Pain in right knee: Secondary | ICD-10-CM | POA: Insufficient documentation

## 2016-04-23 DIAGNOSIS — M6281 Muscle weakness (generalized): Secondary | ICD-10-CM | POA: Insufficient documentation

## 2016-04-23 DIAGNOSIS — M62830 Muscle spasm of back: Secondary | ICD-10-CM | POA: Insufficient documentation

## 2016-04-23 DIAGNOSIS — M545 Low back pain: Secondary | ICD-10-CM | POA: Insufficient documentation

## 2016-04-24 ENCOUNTER — Other Ambulatory Visit: Payer: Self-pay | Admitting: Infectious Disease

## 2016-04-24 NOTE — Telephone Encounter (Signed)
Please advise if refill appropriate for augmentin.

## 2016-04-25 ENCOUNTER — Ambulatory Visit: Payer: Self-pay

## 2016-04-25 ENCOUNTER — Encounter: Payer: Self-pay | Admitting: Physical Therapy

## 2016-04-30 ENCOUNTER — Ambulatory Visit: Payer: Medicare Other | Admitting: Physical Therapy

## 2016-04-30 DIAGNOSIS — M545 Low back pain, unspecified: Secondary | ICD-10-CM

## 2016-04-30 DIAGNOSIS — M25562 Pain in left knee: Secondary | ICD-10-CM

## 2016-04-30 DIAGNOSIS — R262 Difficulty in walking, not elsewhere classified: Secondary | ICD-10-CM | POA: Diagnosis not present

## 2016-04-30 DIAGNOSIS — M62830 Muscle spasm of back: Secondary | ICD-10-CM | POA: Diagnosis not present

## 2016-04-30 DIAGNOSIS — M25561 Pain in right knee: Secondary | ICD-10-CM | POA: Diagnosis not present

## 2016-04-30 DIAGNOSIS — M6281 Muscle weakness (generalized): Secondary | ICD-10-CM
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
## 2016-05-01 DIAGNOSIS — H35352 Cystoid macular degeneration, left eye: Secondary | ICD-10-CM | POA: Diagnosis not present

## 2016-05-02 ENCOUNTER — Ambulatory Visit: Payer: Medicare Other | Admitting: Physical Therapy

## 2016-05-02 DIAGNOSIS — M545 Low back pain, unspecified: Secondary | ICD-10-CM

## 2016-05-02 DIAGNOSIS — M6281 Muscle weakness (generalized): Secondary | ICD-10-CM | POA: Diagnosis not present

## 2016-05-02 DIAGNOSIS — M25561 Pain in right knee: Secondary | ICD-10-CM

## 2016-05-02 DIAGNOSIS — M62830 Muscle spasm of back: Secondary | ICD-10-CM

## 2016-05-02 DIAGNOSIS — M25562 Pain in left knee: Secondary | ICD-10-CM

## 2016-05-02 DIAGNOSIS — R262 Difficulty in walking, not elsewhere classified: Secondary | ICD-10-CM | POA: Diagnosis not present
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
## 2016-05-03 ENCOUNTER — Ambulatory Visit
Admission: RE | Admit: 2016-05-03 | Discharge: 2016-05-03 | Disposition: A | Discharge status: Home / Self Care | Payer: Medicare Other | Source: Ambulatory Visit | Attending: Internal Medicine | Admitting: Internal Medicine

## 2016-05-03 DIAGNOSIS — Z1231 Encounter for screening mammogram for malignant neoplasm of breast: Secondary | ICD-10-CM | POA: Diagnosis not present

## 2016-05-06 ENCOUNTER — Ambulatory Visit: Payer: Medicare Other | Admitting: Physical Therapy

## 2016-05-09 ENCOUNTER — Ambulatory Visit: Payer: Medicare Other | Admitting: Physical Therapy

## 2016-05-21 DIAGNOSIS — F5104 Psychophysiologic insomnia: Secondary | ICD-10-CM | POA: Diagnosis not present

## 2016-05-21 DIAGNOSIS — K580 Irritable bowel syndrome with diarrhea: Secondary | ICD-10-CM | POA: Diagnosis not present

## 2016-05-21 DIAGNOSIS — M545 Low back pain: Secondary | ICD-10-CM | POA: Diagnosis not present

## 2016-05-21 DIAGNOSIS — Z6841 Body Mass Index (BMI) 40.0 and over, adult: Secondary | ICD-10-CM | POA: Diagnosis not present

## 2016-05-21 DIAGNOSIS — R2681 Unsteadiness on feet: Secondary | ICD-10-CM | POA: Diagnosis not present

## 2016-05-21 DIAGNOSIS — N281 Cyst of kidney, acquired: Secondary | ICD-10-CM | POA: Diagnosis not present

## 2016-05-21 DIAGNOSIS — E784 Other hyperlipidemia: Secondary | ICD-10-CM | POA: Diagnosis not present

## 2016-05-21 DIAGNOSIS — I10 Essential (primary) hypertension: Secondary | ICD-10-CM | POA: Diagnosis not present

## 2016-05-21 DIAGNOSIS — R7301 Impaired fasting glucose: Secondary | ICD-10-CM | POA: Diagnosis not present

## 2016-05-21 DIAGNOSIS — Z1389 Encounter for screening for other disorder: Secondary | ICD-10-CM | POA: Diagnosis not present

## 2016-05-29 ENCOUNTER — Emergency Department (HOSPITAL_COMMUNITY)
Admission: EM | Admit: 2016-05-29 | Discharge: 2016-05-29 | Disposition: A | Discharge status: Home / Self Care | Payer: Medicare Other | Attending: Emergency Medicine | Admitting: Emergency Medicine

## 2016-05-29 ENCOUNTER — Emergency Department (HOSPITAL_COMMUNITY): Payer: Medicare Other

## 2016-05-29 ENCOUNTER — Encounter (HOSPITAL_COMMUNITY): Payer: Self-pay

## 2016-05-29 DIAGNOSIS — I5042 Chronic combined systolic (congestive) and diastolic (congestive) heart failure: Secondary | ICD-10-CM | POA: Insufficient documentation

## 2016-05-29 DIAGNOSIS — I110 Hypertensive heart disease with heart failure: Secondary | ICD-10-CM | POA: Insufficient documentation

## 2016-05-29 DIAGNOSIS — R1011 Right upper quadrant pain: Secondary | ICD-10-CM | POA: Diagnosis not present

## 2016-05-29 DIAGNOSIS — R1012 Left upper quadrant pain: Secondary | ICD-10-CM | POA: Insufficient documentation

## 2016-05-29 DIAGNOSIS — Z79899 Other long term (current) drug therapy: Secondary | ICD-10-CM | POA: Insufficient documentation

## 2016-05-29 DIAGNOSIS — R1084 Generalized abdominal pain: Secondary | ICD-10-CM | POA: Diagnosis not present

## 2016-05-29 DIAGNOSIS — R1010 Upper abdominal pain, unspecified: Secondary | ICD-10-CM

## 2016-05-29 DIAGNOSIS — R1013 Epigastric pain: Secondary | ICD-10-CM | POA: Insufficient documentation

## 2016-05-29 DIAGNOSIS — M549 Dorsalgia, unspecified: Secondary | ICD-10-CM | POA: Diagnosis not present

## 2016-05-29 DIAGNOSIS — R110 Nausea: Secondary | ICD-10-CM | POA: Diagnosis not present

## 2016-05-29 LAB — URINALYSIS, ROUTINE W REFLEX MICROSCOPIC
Bilirubin Urine: NEGATIVE
Glucose, UA: NEGATIVE mg/dL
Ketones, ur: NEGATIVE mg/dL
NITRITE: NEGATIVE
PROTEIN: NEGATIVE mg/dL
SPECIFIC GRAVITY, URINE: 1.006 (ref 1.005–1.030)
pH: 7 (ref 5.0–8.0)

## 2016-05-29 LAB — COMPREHENSIVE METABOLIC PANEL
ALT: 61 U/L — ABNORMAL HIGH (ref 14–54)
AST: 191 U/L — AB (ref 15–41)
Albumin: 4 g/dL (ref 3.5–5.0)
Alkaline Phosphatase: 104 U/L (ref 38–126)
Anion gap: 11 (ref 5–15)
BILIRUBIN TOTAL: 0.8 mg/dL (ref 0.3–1.2)
BUN: 16 mg/dL (ref 6–20)
CALCIUM: 9 mg/dL (ref 8.9–10.3)
CHLORIDE: 103 mmol/L (ref 101–111)
CO2: 25 mmol/L (ref 22–32)
Creatinine, Ser: 1.14 mg/dL — ABNORMAL HIGH (ref 0.44–1.00)
GFR, EST AFRICAN AMERICAN: 56 mL/min — AB (ref >60)
GFR, EST NON AFRICAN AMERICAN: 49 mL/min — AB (ref >60)
Glucose, Bld: 95 mg/dL (ref 65–99)
POTASSIUM: 3.6 mmol/L (ref 3.5–5.1)
Sodium: 139 mmol/L (ref 135–145)
TOTAL PROTEIN: 8 g/dL (ref 6.5–8.1)

## 2016-05-29 LAB — CBC
HCT: 42.8 % (ref 36.0–46.0)
HEMOGLOBIN: 13.6 g/dL (ref 12.0–15.0)
MCH: 27.5 pg (ref 26.0–34.0)
MCHC: 31.8 g/dL (ref 30.0–36.0)
MCV: 86.5 fL (ref 78.0–100.0)
PLATELETS: 197 10*3/uL (ref 150–400)
RBC: 4.95 MIL/uL (ref 3.87–5.11)
RDW: 15.7 % — ABNORMAL HIGH (ref 11.5–15.5)
WBC: 4.7 10*3/uL (ref 4.0–10.5)

## 2016-05-29 LAB — URINE MICROSCOPIC-ADD ON

## 2016-05-29 LAB — LIPASE, BLOOD: LIPASE: 41 U/L (ref 11–51)

## 2016-05-29 MED ORDER — ONDANSETRON HCL 4 MG/2ML IJ SOLN
4.0000 mg | Freq: Once | INTRAMUSCULAR | Status: AC
  Administered 2016-05-29: 4 mg via INTRAVENOUS
  Filled 2016-05-29: qty 2

## 2016-05-29 MED ORDER — FAMOTIDINE 20 MG PO TABS: 20.0000 mg | ORAL_TABLET | Freq: Two times a day (BID) | ORAL | 0 refills | Status: DC

## 2016-05-29 MED ORDER — FENTANYL CITRATE (PF) 100 MCG/2ML IJ SOLN
100.0000 ug | Freq: Once | INTRAMUSCULAR | Status: AC
  Administered 2016-05-29: 100 ug via INTRAVENOUS
  Filled 2016-05-29: qty 2
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
## 2016-05-29 NOTE — ED Notes (Signed)
Pt cannot use restroom at this time, aware urine specimen is needed.  
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
## 2016-05-30 ENCOUNTER — Ambulatory Visit: Payer: Medicare Other | Attending: Internal Medicine | Admitting: Physical Therapy

## 2016-05-30 DIAGNOSIS — R262 Difficulty in walking, not elsewhere classified: Secondary | ICD-10-CM | POA: Diagnosis not present

## 2016-05-30 DIAGNOSIS — M545 Low back pain, unspecified: Secondary | ICD-10-CM

## 2016-05-30 DIAGNOSIS — M62830 Muscle spasm of back: Secondary | ICD-10-CM

## 2016-05-30 DIAGNOSIS — M6281 Muscle weakness (generalized): Secondary | ICD-10-CM | POA: Diagnosis not present

## 2016-05-30 DIAGNOSIS — G8929 Other chronic pain: Secondary | ICD-10-CM

## 2016-05-30 DIAGNOSIS — M25561 Pain in right knee: Secondary | ICD-10-CM | POA: Diagnosis not present

## 2016-05-30 DIAGNOSIS — M25562 Pain in left knee: Secondary | ICD-10-CM | POA: Diagnosis not present
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
## 2016-06-06 ENCOUNTER — Ambulatory Visit: Payer: Medicare Other | Admitting: Physical Therapy

## 2016-06-06 DIAGNOSIS — M62830 Muscle spasm of back: Secondary | ICD-10-CM

## 2016-06-06 DIAGNOSIS — M545 Low back pain, unspecified: Secondary | ICD-10-CM

## 2016-06-06 DIAGNOSIS — M25561 Pain in right knee: Secondary | ICD-10-CM | POA: Diagnosis not present

## 2016-06-06 DIAGNOSIS — M25562 Pain in left knee: Secondary | ICD-10-CM

## 2016-06-06 DIAGNOSIS — M6281 Muscle weakness (generalized): Secondary | ICD-10-CM

## 2016-06-06 DIAGNOSIS — R262 Difficulty in walking, not elsewhere classified: Secondary | ICD-10-CM

## 2016-06-06 DIAGNOSIS — G8929 Other chronic pain: Secondary | ICD-10-CM
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
## 2016-06-10 ENCOUNTER — Ambulatory Visit: Payer: Medicare Other | Admitting: Physical Therapy

## 2016-06-10 DIAGNOSIS — M25561 Pain in right knee: Secondary | ICD-10-CM

## 2016-06-10 DIAGNOSIS — M545 Low back pain, unspecified: Secondary | ICD-10-CM

## 2016-06-10 DIAGNOSIS — G8929 Other chronic pain: Secondary | ICD-10-CM | POA: Diagnosis not present

## 2016-06-10 DIAGNOSIS — R262 Difficulty in walking, not elsewhere classified: Secondary | ICD-10-CM

## 2016-06-10 DIAGNOSIS — M62830 Muscle spasm of back: Secondary | ICD-10-CM

## 2016-06-10 DIAGNOSIS — M6281 Muscle weakness (generalized): Secondary | ICD-10-CM

## 2016-06-10 DIAGNOSIS — M25562 Pain in left knee: Secondary | ICD-10-CM | POA: Diagnosis not present
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
## 2016-06-11 ENCOUNTER — Institutional Professional Consult (permissible substitution): Payer: Medicaid Other | Admitting: Neurology

## 2016-06-12 ENCOUNTER — Ambulatory Visit: Payer: Medicare Other | Admitting: Physical Therapy

## 2016-06-12 DIAGNOSIS — R262 Difficulty in walking, not elsewhere classified: Secondary | ICD-10-CM | POA: Diagnosis not present

## 2016-06-12 DIAGNOSIS — M25562 Pain in left knee: Secondary | ICD-10-CM

## 2016-06-12 DIAGNOSIS — M545 Low back pain, unspecified: Secondary | ICD-10-CM

## 2016-06-12 DIAGNOSIS — M25561 Pain in right knee: Secondary | ICD-10-CM | POA: Diagnosis not present

## 2016-06-12 DIAGNOSIS — M62830 Muscle spasm of back: Secondary | ICD-10-CM

## 2016-06-12 DIAGNOSIS — M6281 Muscle weakness (generalized): Secondary | ICD-10-CM | POA: Diagnosis not present

## 2016-06-12 DIAGNOSIS — G8929 Other chronic pain: Secondary | ICD-10-CM
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
## 2016-06-17 ENCOUNTER — Ambulatory Visit: Payer: Medicare Other | Admitting: Physical Therapy

## 2016-06-18 ENCOUNTER — Ambulatory Visit: Payer: Self-pay | Admitting: Infectious Disease

## 2016-06-18 DIAGNOSIS — M4316 Spondylolisthesis, lumbar region: Secondary | ICD-10-CM | POA: Diagnosis not present

## 2016-06-19 ENCOUNTER — Institutional Professional Consult (permissible substitution): Payer: Medicaid Other | Admitting: Neurology

## 2016-06-19 ENCOUNTER — Ambulatory Visit: Payer: Medicare Other | Attending: Internal Medicine | Admitting: Physical Therapy

## 2016-06-19 DIAGNOSIS — M545 Low back pain, unspecified: Secondary | ICD-10-CM

## 2016-06-19 DIAGNOSIS — M25562 Pain in left knee: Secondary | ICD-10-CM | POA: Diagnosis not present

## 2016-06-19 DIAGNOSIS — M6281 Muscle weakness (generalized): Secondary | ICD-10-CM | POA: Diagnosis not present

## 2016-06-19 DIAGNOSIS — R262 Difficulty in walking, not elsewhere classified: Secondary | ICD-10-CM | POA: Diagnosis not present

## 2016-06-19 DIAGNOSIS — M62830 Muscle spasm of back: Secondary | ICD-10-CM | POA: Insufficient documentation

## 2016-06-19 DIAGNOSIS — M25561 Pain in right knee: Secondary | ICD-10-CM | POA: Diagnosis not present

## 2016-06-19 DIAGNOSIS — G8929 Other chronic pain: Secondary | ICD-10-CM | POA: Diagnosis not present
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
## 2016-06-19 NOTE — Patient Instructions (Addendum)

## 2016-06-22 NOTE — Progress Notes (Signed)
Late entry for missed gcode    10/26/15 1025  OT Time Calculation  OT Start Time (ACUTE ONLY) 1001  OT Stop Time (ACUTE ONLY) 1021  OT Time Calculation (min) 20 min  OT G-codes **NOT FOR INPATIENT CLASS**  Functional Assessment Tool Used Clinical judgement  Functional Limitation Self care  Self Care Current Status [id number]) CK  Self Care Goal Status [id number]) CK  Self Care Discharge Status [id number]) CK  OT General Charges  $OT Visit 1 Procedure  OT Evaluation  $OT Eval Moderate Complexity 1 Procedure   Truman Hayward M.S., OTR/L Pager: [phone]

## 2016-06-24 ENCOUNTER — Ambulatory Visit: Payer: Medicare Other | Admitting: Physical Therapy

## 2016-06-24 DIAGNOSIS — M545 Low back pain, unspecified: Secondary | ICD-10-CM

## 2016-06-24 DIAGNOSIS — G8929 Other chronic pain: Secondary | ICD-10-CM | POA: Diagnosis not present

## 2016-06-24 DIAGNOSIS — M25561 Pain in right knee: Secondary | ICD-10-CM

## 2016-06-24 DIAGNOSIS — M25562 Pain in left knee: Secondary | ICD-10-CM

## 2016-06-24 DIAGNOSIS — M6281 Muscle weakness (generalized): Secondary | ICD-10-CM | POA: Diagnosis not present

## 2016-06-24 DIAGNOSIS — M62830 Muscle spasm of back: Secondary | ICD-10-CM

## 2016-06-24 DIAGNOSIS — R262 Difficulty in walking, not elsewhere classified: Secondary | ICD-10-CM
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
## 2016-06-24 NOTE — Progress Notes (Signed)
Physical Therapy Evaluation Addendum for G-codes    Oct 29, 2015 0913  PT G-Codes **NOT FOR INPATIENT CLASS**  Functional Assessment Tool Used clinical judgement  Functional Limitation Mobility: Walking and moving around  Mobility: Walking and Moving Around Current Status [phone]) CI  Mobility: Walking and Moving Around Goal Status [phone]) CI    06/24/2016 Barry Brunner, PT Pager: [phone]

## 2016-06-26 ENCOUNTER — Ambulatory Visit: Payer: Medicare Other | Admitting: Physical Therapy

## 2016-07-01 ENCOUNTER — Ambulatory Visit: Payer: Medicare Other | Admitting: Physical Therapy

## 2016-07-02 ENCOUNTER — Encounter: Payer: Self-pay | Admitting: Neurology

## 2016-07-02 ENCOUNTER — Ambulatory Visit (INDEPENDENT_AMBULATORY_CARE_PROVIDER_SITE_OTHER): Payer: Medicare Other | Admitting: Neurology

## 2016-07-02 VITALS — BP 160/89 | HR 69 | Resp 20 | Ht 63.0 in | Wt 258.0 lb

## 2016-07-02 DIAGNOSIS — R0683 Snoring: Secondary | ICD-10-CM | POA: Diagnosis not present

## 2016-07-02 DIAGNOSIS — I5033 Acute on chronic diastolic (congestive) heart failure: Secondary | ICD-10-CM | POA: Diagnosis not present

## 2016-07-02 DIAGNOSIS — R5383 Other fatigue: Secondary | ICD-10-CM | POA: Diagnosis not present

## 2016-07-02 DIAGNOSIS — G9389 Other specified disorders of brain: Secondary | ICD-10-CM

## 2016-07-02 DIAGNOSIS — G479 Sleep disorder, unspecified: Secondary | ICD-10-CM | POA: Diagnosis not present

## 2016-07-02 DIAGNOSIS — E662 Morbid (severe) obesity with alveolar hypoventilation: Secondary | ICD-10-CM

## 2016-07-02 DIAGNOSIS — R0689 Other abnormalities of breathing: Secondary | ICD-10-CM

## 2016-07-02 DIAGNOSIS — T40605A Adverse effect of unspecified narcotics, initial encounter: Secondary | ICD-10-CM

## 2016-07-02 DIAGNOSIS — G4700 Insomnia, unspecified: Secondary | ICD-10-CM

## 2016-07-02 DIAGNOSIS — E669 Obesity, unspecified: Secondary | ICD-10-CM

## 2016-07-02 NOTE — Patient Instructions (Signed)
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
## 2016-07-03 ENCOUNTER — Ambulatory Visit: Payer: Medicare Other | Admitting: Physical Therapy

## 2016-07-03 VITALS — BP 196/83

## 2016-07-03 DIAGNOSIS — M6281 Muscle weakness (generalized): Secondary | ICD-10-CM

## 2016-07-03 DIAGNOSIS — M545 Low back pain, unspecified: Secondary | ICD-10-CM

## 2016-07-03 DIAGNOSIS — R262 Difficulty in walking, not elsewhere classified: Secondary | ICD-10-CM
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
## 2016-07-08 ENCOUNTER — Ambulatory Visit: Payer: Medicare Other | Admitting: Physical Therapy

## 2016-07-10 ENCOUNTER — Ambulatory Visit: Payer: Medicare Other | Admitting: Physical Therapy

## 2016-07-10 DIAGNOSIS — M62830 Muscle spasm of back: Secondary | ICD-10-CM

## 2016-07-10 DIAGNOSIS — M25561 Pain in right knee: Secondary | ICD-10-CM | POA: Diagnosis not present

## 2016-07-10 DIAGNOSIS — M545 Low back pain, unspecified: Secondary | ICD-10-CM

## 2016-07-10 DIAGNOSIS — R262 Difficulty in walking, not elsewhere classified: Secondary | ICD-10-CM

## 2016-07-10 DIAGNOSIS — M25562 Pain in left knee: Secondary | ICD-10-CM | POA: Diagnosis not present

## 2016-07-10 DIAGNOSIS — M6281 Muscle weakness (generalized): Secondary | ICD-10-CM

## 2016-07-10 DIAGNOSIS — G8929 Other chronic pain: Secondary | ICD-10-CM | POA: Diagnosis not present
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
## 2016-07-15 ENCOUNTER — Ambulatory Visit: Payer: Medicare Other | Admitting: Physical Therapy

## 2016-07-17 ENCOUNTER — Ambulatory Visit: Payer: Medicare Other | Admitting: Physical Therapy

## 2016-07-22 ENCOUNTER — Ambulatory Visit (INDEPENDENT_AMBULATORY_CARE_PROVIDER_SITE_OTHER): Payer: Medicare Other | Admitting: Infectious Disease

## 2016-07-22 VITALS — BP 114/77 | HR 80 | Temp 98.2°F | Ht 64.0 in | Wt 244.0 lb

## 2016-07-22 DIAGNOSIS — T814XXA Infection following a procedure, initial encounter: Secondary | ICD-10-CM | POA: Diagnosis not present

## 2016-07-22 DIAGNOSIS — IMO0001 Reserved for inherently not codable concepts without codable children: Secondary | ICD-10-CM

## 2016-07-22 DIAGNOSIS — G062 Extradural and subdural abscess, unspecified: Secondary | ICD-10-CM

## 2016-07-22 DIAGNOSIS — A498 Other bacterial infections of unspecified site: Secondary | ICD-10-CM

## 2016-07-22 DIAGNOSIS — M4647 Discitis, unspecified, lumbosacral region: Secondary | ICD-10-CM | POA: Diagnosis not present

## 2016-07-22 DIAGNOSIS — Z981 Arthrodesis status: Secondary | ICD-10-CM | POA: Diagnosis not present

## 2016-07-22 DIAGNOSIS — B961 Klebsiella pneumoniae [K. pneumoniae] as the cause of diseases classified elsewhere: Secondary | ICD-10-CM | POA: Diagnosis not present
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
## 2016-07-23 LAB — BASIC METABOLIC PANEL WITH GFR
BUN: 15 mg/dL (ref 7–25)
CALCIUM: 8.7 mg/dL (ref 8.6–10.4)
CO2: 26 mmol/L (ref 20–31)
Chloride: 104 mmol/L (ref 98–110)
Creat: 1.55 mg/dL — ABNORMAL HIGH (ref 0.50–0.99)
GFR, EST AFRICAN AMERICAN: 40 mL/min — AB (ref >=60)
GFR, EST NON AFRICAN AMERICAN: 34 mL/min — AB (ref >=60)
Glucose, Bld: 86 mg/dL (ref 65–99)
POTASSIUM: 3.9 mmol/L (ref 3.5–5.3)
SODIUM: 140 mmol/L (ref 135–146)

## 2016-07-23 LAB — C-REACTIVE PROTEIN: CRP: 15.1 mg/L — AB (ref <8.0)

## 2016-07-23 LAB — SEDIMENTATION RATE: SED RATE: 27 mm/h (ref 0–30)

## 2016-08-09 ENCOUNTER — Emergency Department (HOSPITAL_COMMUNITY)
Admission: EM | Admit: 2016-08-09 | Discharge: 2016-08-09 | Disposition: A | Discharge status: Home / Self Care | Payer: Medicare Other | Attending: Emergency Medicine | Admitting: Emergency Medicine

## 2016-08-09 ENCOUNTER — Emergency Department (HOSPITAL_COMMUNITY): Payer: Medicare Other

## 2016-08-09 ENCOUNTER — Encounter (HOSPITAL_COMMUNITY): Payer: Self-pay | Admitting: Emergency Medicine

## 2016-08-09 DIAGNOSIS — I509 Heart failure, unspecified: Secondary | ICD-10-CM | POA: Insufficient documentation

## 2016-08-09 DIAGNOSIS — Z96653 Presence of artificial knee joint, bilateral: Secondary | ICD-10-CM | POA: Diagnosis not present

## 2016-08-09 DIAGNOSIS — Z5321 Procedure and treatment not carried out due to patient leaving prior to being seen by health care provider: Secondary | ICD-10-CM | POA: Diagnosis not present

## 2016-08-09 DIAGNOSIS — W19XXXA Unspecified fall, initial encounter: Secondary | ICD-10-CM | POA: Insufficient documentation

## 2016-08-09 DIAGNOSIS — Y999 Unspecified external cause status: Secondary | ICD-10-CM | POA: Insufficient documentation

## 2016-08-09 DIAGNOSIS — Y929 Unspecified place or not applicable: Secondary | ICD-10-CM | POA: Insufficient documentation

## 2016-08-09 DIAGNOSIS — M545 Low back pain: Secondary | ICD-10-CM | POA: Insufficient documentation

## 2016-08-09 DIAGNOSIS — Y939 Activity, unspecified: Secondary | ICD-10-CM | POA: Diagnosis not present

## 2016-08-09 DIAGNOSIS — S3992XA Unspecified injury of lower back, initial encounter: Secondary | ICD-10-CM | POA: Diagnosis not present

## 2016-08-09 DIAGNOSIS — I110 Hypertensive heart disease with heart failure: Secondary | ICD-10-CM | POA: Insufficient documentation

## 2016-08-09 NOTE — ED Triage Notes (Signed)
Pt states she fell a few days ago because she lost her balance, states she fell forward and tweaked her back. Denies hitting head, denies LOC.

## 2016-08-09 NOTE — ED Triage Notes (Signed)
Pt states "I had surgery on the 8th of march on my back and I fell a few days ago and ever since then my back has hurt worse than it has been". Unable to descrbie what type of back surgery, they had metal plates and screws put in. Pt c/o lower back pain.

## 2016-08-09 NOTE — ED Notes (Signed)
Pt family member to desk stating pt wants to leave due to wait. This RN spoke with fast track provider, Provider unable to see pt in triage due to back complications and surgeries in hx, pt informed of updated wait time. Pt stating she is going to leave at this time.

## 2016-08-27 DIAGNOSIS — E784 Other hyperlipidemia: Secondary | ICD-10-CM | POA: Diagnosis not present

## 2016-08-27 DIAGNOSIS — Z1389 Encounter for screening for other disorder: Secondary | ICD-10-CM | POA: Diagnosis not present

## 2016-08-27 DIAGNOSIS — F5104 Psychophysiologic insomnia: Secondary | ICD-10-CM | POA: Diagnosis not present

## 2016-08-27 DIAGNOSIS — Z6841 Body Mass Index (BMI) 40.0 and over, adult: Secondary | ICD-10-CM | POA: Diagnosis not present

## 2016-08-27 DIAGNOSIS — I10 Essential (primary) hypertension: Secondary | ICD-10-CM | POA: Diagnosis not present

## 2016-08-27 DIAGNOSIS — R7301 Impaired fasting glucose: Secondary | ICD-10-CM | POA: Diagnosis not present

## 2016-08-27 DIAGNOSIS — K580 Irritable bowel syndrome with diarrhea: Secondary | ICD-10-CM | POA: Diagnosis not present

## 2016-08-27 DIAGNOSIS — R2681 Unsteadiness on feet: Secondary | ICD-10-CM | POA: Diagnosis not present

## 2016-08-28 DIAGNOSIS — I270 Primary pulmonary hypertension: Secondary | ICD-10-CM | POA: Diagnosis not present

## 2016-08-29 DIAGNOSIS — I270 Primary pulmonary hypertension: Secondary | ICD-10-CM | POA: Diagnosis not present

## 2016-09-13 ENCOUNTER — Ambulatory Visit (INDEPENDENT_AMBULATORY_CARE_PROVIDER_SITE_OTHER): Payer: Medicare Other | Admitting: Neurology

## 2016-09-13 DIAGNOSIS — E669 Obesity, unspecified: Secondary | ICD-10-CM

## 2016-09-13 DIAGNOSIS — R0683 Snoring: Secondary | ICD-10-CM

## 2016-09-13 DIAGNOSIS — G4700 Insomnia, unspecified: Secondary | ICD-10-CM

## 2016-09-13 DIAGNOSIS — G4710 Hypersomnia, unspecified: Secondary | ICD-10-CM | POA: Diagnosis not present

## 2016-09-13 DIAGNOSIS — G479 Sleep disorder, unspecified: Secondary | ICD-10-CM

## 2016-09-13 DIAGNOSIS — I5033 Acute on chronic diastolic (congestive) heart failure: Secondary | ICD-10-CM

## 2016-09-13 DIAGNOSIS — R5383 Other fatigue: Secondary | ICD-10-CM

## 2016-09-16 ENCOUNTER — Ambulatory Visit (INDEPENDENT_AMBULATORY_CARE_PROVIDER_SITE_OTHER): Payer: Medicare Other | Admitting: Cardiovascular Disease

## 2016-09-16 ENCOUNTER — Encounter: Payer: Self-pay | Admitting: Cardiovascular Disease

## 2016-09-16 VITALS — BP 130/74 | HR 52 | Ht 63.0 in | Wt 271.0 lb

## 2016-09-16 DIAGNOSIS — I5032 Chronic diastolic (congestive) heart failure: Secondary | ICD-10-CM

## 2016-09-16 DIAGNOSIS — I493 Ventricular premature depolarization: Secondary | ICD-10-CM | POA: Diagnosis not present

## 2016-09-16 DIAGNOSIS — I10 Essential (primary) hypertension: Secondary | ICD-10-CM

## 2016-09-16 DIAGNOSIS — R0600 Dyspnea, unspecified: Secondary | ICD-10-CM

## 2016-09-16 DIAGNOSIS — I491 Atrial premature depolarization: Secondary | ICD-10-CM | POA: Diagnosis not present
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
## 2016-09-16 NOTE — Patient Instructions (Signed)
Medication Instructions:  Your physician recommends that you continue on your current medications as directed. Please refer to the Current Medication list given to you today.  Labwork: FASTING LABS WHEN YOU RETURN IN 3 MONTHS  Testing/Procedures: NONE   Follow-Up: Your physician wants you to follow-up in: 3 MONTH OV You will receive a reminder letter in the mail two months in advance. If you don't receive a letter, please call our office to schedule the follow-up appointment.  If you need a refill on your cardiac medications before your next appointment, please call your pharmacy.

## 2016-09-17 NOTE — Procedures (Signed)
PATIENT'S NAME:  [name], [place] DOB:      1951-07-22      MR#:    [id number]     DATE OF RECORDING: 09/13/2016 REFERRING M.D.:  [name] MD Study Performed:   Baseline Polysomnogram HISTORY:  [name] is a 66 y.o. female, who has been insomniac for over a decade, and she was witnessed to snore at home by her roommate and during a post- surgical stay in hospital. She has lost a lot of weight since her surgery she stated, but remains super-obese. She is often still awake at day break, frustrated with her inability to go to sleep. Remarkably, she reports no daytime sleepiness.   The patient endorsed the Epworth Sleepiness Scale at 2 points.   The patient's weight 258 pounds with a height of 63 (inches), resulting in a BMI of 45.7 kg/m2. The patient's neck circumference measured 17.5 inches.  CURRENT MEDICATIONS: Proventil, Vantin, Celexa, Catapres, Santyl, Durezol, Viberzi, Pepcid, Difulcan, Lasix, Neurontin, Cozaar, Hyzaar, Lopressor, MS Contin, Nexium, Zofran, Percocet, Mirilax, K-Dur, Compazine, Phenergan, Zocor, Ventolin, Drisdol   PROCEDURE:  This is a multichannel digital polysomnogram utilizing the Somnostar 11.2 system.  Electrodes and sensors were applied and monitored per AASM Specifications.   EEG, EOG, Chin and Limb EMG, were sampled at 200 Hz.  ECG, Snore and Nasal Pressure, Thermal Airflow, Respiratory Effort, CPAP Flow and Pressure, Oximetry was sampled at 50 Hz. Digital video and audio were recorded.      BASELINE STUDY  Lights Out was at 00:03 and Lights On at 08:34.  Total recording time (TRT) was 511 minutes, with a total sleep time (TST) of 395 minutes.   The patient's sleep latency was 65 minutes.  REM latency was 0 minutes.  The sleep efficiency was 77.3 %.     SLEEP ARCHITECTURE: WASO (Wake after sleep onset) was 54.5 minutes.  There were 9.5 minutes in Stage N1, 362.5 minutes Stage N2, 23 minutes Stage N3 and 0 minutes in Stage REM.  The percentage of Stage N1  was 2.4%, Stage N2 was 91.8%, Stage N3 was 5.8% and Stage R (REM sleep) was 0%.   RESPIRATORY ANALYSIS:  There were a total of 5 respiratory events:  1 obstructive apnea, 0 central apneas and 4 hypopneas with 0 respiratory event related arousals (RERAs).    The total APNEA/HYPOPNEA INDEX (AHI) was 0.8/hour and the total RESPIRATORY DISTURBANCE INDEX was .8 /hour.  0 events occurred in REM sleep and 8 events in NREM. The REM AHI was 0.0 /hour, versus a non-REM AHI of 0.8. The patient spent 342 minutes of total sleep time in the supine position and 53 minutes in non-supine. The supine AHI was 0.9 versus a non-supine AHI of 0.0.  OXYGEN SATURATION & C02:  The Wake baseline 02 saturation was 98%, with the lowest being 91%. Time spent below 89% saturation equaled 0 minutes.  Average End Tidal CO2 during sleep was 30.7 torr.  During NREM, the average End Tidal CO2 during sleep was 30.7 torr.  Total sleep time greater than 50 torr was 0.60 minutes.   PERIODIC LIMB MOVEMENTS:   The patient had a total of 0 Periodic Limb Movements.    The arousals were noted as: 55 were spontaneous, 0 were associated with PLMs, and 7 were associated with respiratory events.  Audio and video analysis did not show any abnormal or unusual movements, behaviors, phonations or vocalizations.  Some Snoring was noted. EKG with irregular R to R interval, but mostly NSR.  IMPRESSION: prolonged sleep latency but not Insomnia with over 77% sleep efficiency. No physiological sleep disorder identified. No Apnea , mild snoring only, no hypoxemia, hypercapnia.  1. Repetitive Intrusions of Sleep  RECOMMENDATIONS: 1. PCP to refer for dedicated sleep psychology referral as insomnia is of clinical concern.   2. A follow up appointment can be scheduled in the Sleep Clinic at [facility]. The referring provider will be notified of the results.      I certify that I have reviewed the entire raw data recording prior  to the issuance of this report in accordance with the Standards of Accreditation of the [place] [employer] (AASM)      Larey Seat, MD  09-17-2016 Diplomat, American Board of Psychiatry and Neurology  Diplomat, American Board of [place] Director, [place] [facility] [employer]

## 2016-09-18 ENCOUNTER — Telehealth: Payer: Self-pay

## 2016-09-18 NOTE — Telephone Encounter (Signed)
I spoke to patient and she is aware of results and recommendations. She states that she will f/u with PCP. I will fax a copy of report to PCP.

## 2016-09-18 NOTE — Telephone Encounter (Signed)
-----   Message from Larey Seat, MD sent at 09/17/2016  5:13 PM EST ----- IMPRESSION: PSG recording followed prolonged sleep latency, but does not consitute Insomnia with over 77% sleep efficiency. No physiological sleep disorder identified. No Apnea  mild snoring only, no hypoxemia, hypercapnia.  1. Repetitive Intrusions of Sleep, spontaneous arousals.  RECOMMENDATIONS: 1. PCP to refer for dedicated sleep psychology referral as insomnia is of clinical concern.   2. A follow up appointment can be scheduled in the Sleep Clinic at [facility]. The referring provider will be notified of the results.

## 2016-09-27 DIAGNOSIS — H35352 Cystoid macular degeneration, left eye: Secondary | ICD-10-CM | POA: Diagnosis not present

## 2016-10-25 DIAGNOSIS — H35352 Cystoid macular degeneration, left eye: Secondary | ICD-10-CM | POA: Diagnosis not present

## 2016-10-30 ENCOUNTER — Ambulatory Visit: Payer: Medicare Other | Admitting: Infectious Disease

## 2016-10-31 DIAGNOSIS — E784 Other hyperlipidemia: Secondary | ICD-10-CM | POA: Diagnosis not present

## 2016-10-31 DIAGNOSIS — F5104 Psychophysiologic insomnia: Secondary | ICD-10-CM | POA: Diagnosis not present

## 2016-10-31 DIAGNOSIS — F418 Other specified anxiety disorders: Secondary | ICD-10-CM | POA: Diagnosis not present

## 2016-10-31 DIAGNOSIS — I10 Essential (primary) hypertension: Secondary | ICD-10-CM | POA: Diagnosis not present

## 2016-10-31 DIAGNOSIS — R2681 Unsteadiness on feet: Secondary | ICD-10-CM | POA: Diagnosis not present

## 2016-10-31 DIAGNOSIS — R7301 Impaired fasting glucose: Secondary | ICD-10-CM | POA: Diagnosis not present

## 2016-10-31 DIAGNOSIS — Z6841 Body Mass Index (BMI) 40.0 and over, adult: Secondary | ICD-10-CM | POA: Diagnosis not present

## 2016-10-31 DIAGNOSIS — M545 Low back pain: Secondary | ICD-10-CM | POA: Diagnosis not present

## 2016-11-15 DIAGNOSIS — R5383 Other fatigue: Secondary | ICD-10-CM | POA: Diagnosis not present

## 2016-11-15 DIAGNOSIS — I10 Essential (primary) hypertension: Secondary | ICD-10-CM | POA: Diagnosis not present

## 2016-11-15 DIAGNOSIS — R0602 Shortness of breath: Secondary | ICD-10-CM | POA: Diagnosis not present

## 2016-11-15 DIAGNOSIS — Z87891 Personal history of nicotine dependence: Secondary | ICD-10-CM | POA: Diagnosis not present

## 2016-11-15 DIAGNOSIS — J101 Influenza due to other identified influenza virus with other respiratory manifestations: Secondary | ICD-10-CM | POA: Diagnosis not present

## 2016-11-15 DIAGNOSIS — R509 Fever, unspecified: Secondary | ICD-10-CM | POA: Diagnosis not present

## 2016-11-15 DIAGNOSIS — Z79899 Other long term (current) drug therapy: Secondary | ICD-10-CM | POA: Diagnosis not present

## 2016-11-15 DIAGNOSIS — R05 Cough: Secondary | ICD-10-CM | POA: Diagnosis not present

## 2017-01-28 DIAGNOSIS — H35352 Cystoid macular degeneration, left eye: Secondary | ICD-10-CM | POA: Diagnosis not present

## 2017-03-06 DIAGNOSIS — I10 Essential (primary) hypertension: Secondary | ICD-10-CM | POA: Diagnosis not present

## 2017-03-06 DIAGNOSIS — R2681 Unsteadiness on feet: Secondary | ICD-10-CM | POA: Diagnosis not present

## 2017-03-06 DIAGNOSIS — R7301 Impaired fasting glucose: Secondary | ICD-10-CM | POA: Diagnosis not present

## 2017-03-06 DIAGNOSIS — Z6841 Body Mass Index (BMI) 40.0 and over, adult: Secondary | ICD-10-CM | POA: Diagnosis not present

## 2017-03-06 DIAGNOSIS — F418 Other specified anxiety disorders: Secondary | ICD-10-CM | POA: Diagnosis not present

## 2017-03-06 DIAGNOSIS — F5104 Psychophysiologic insomnia: Secondary | ICD-10-CM | POA: Diagnosis not present

## 2017-03-06 DIAGNOSIS — M545 Low back pain: Secondary | ICD-10-CM | POA: Diagnosis not present

## 2017-03-06 DIAGNOSIS — N390 Urinary tract infection, site not specified: Secondary | ICD-10-CM | POA: Diagnosis not present

## 2017-03-06 DIAGNOSIS — K580 Irritable bowel syndrome with diarrhea: Secondary | ICD-10-CM | POA: Diagnosis not present

## 2017-03-06 DIAGNOSIS — R1012 Left upper quadrant pain: Secondary | ICD-10-CM | POA: Diagnosis not present

## 2017-03-06 DIAGNOSIS — R8299 Other abnormal findings in urine: Secondary | ICD-10-CM | POA: Diagnosis not present

## 2017-03-06 DIAGNOSIS — R110 Nausea: Secondary | ICD-10-CM | POA: Diagnosis not present

## 2017-03-27 ENCOUNTER — Encounter (HOSPITAL_COMMUNITY): Payer: Self-pay | Admitting: Emergency Medicine

## 2017-03-27 ENCOUNTER — Ambulatory Visit (HOSPITAL_COMMUNITY)
Admission: EM | Admit: 2017-03-27 | Discharge: 2017-03-27 | Disposition: A | Discharge status: Home / Self Care | Payer: Medicare Other | Source: Home / Self Care | Attending: Internal Medicine | Admitting: Internal Medicine

## 2017-03-27 ENCOUNTER — Emergency Department (HOSPITAL_COMMUNITY): Payer: Medicare Other

## 2017-03-27 DIAGNOSIS — R509 Fever, unspecified: Secondary | ICD-10-CM | POA: Diagnosis present

## 2017-03-27 DIAGNOSIS — Z79899 Other long term (current) drug therapy: Secondary | ICD-10-CM | POA: Diagnosis not present

## 2017-03-27 DIAGNOSIS — R1030 Lower abdominal pain, unspecified: Secondary | ICD-10-CM | POA: Insufficient documentation

## 2017-03-27 DIAGNOSIS — Z885 Allergy status to narcotic agent status: Secondary | ICD-10-CM | POA: Insufficient documentation

## 2017-03-27 DIAGNOSIS — Z880 Allergy status to penicillin: Secondary | ICD-10-CM | POA: Diagnosis not present

## 2017-03-27 DIAGNOSIS — I110 Hypertensive heart disease with heart failure: Secondary | ICD-10-CM | POA: Insufficient documentation

## 2017-03-27 DIAGNOSIS — I5042 Chronic combined systolic (congestive) and diastolic (congestive) heart failure: Secondary | ICD-10-CM | POA: Insufficient documentation

## 2017-03-27 DIAGNOSIS — R109 Unspecified abdominal pain: Secondary | ICD-10-CM | POA: Diagnosis not present

## 2017-03-27 DIAGNOSIS — K5730 Diverticulosis of large intestine without perforation or abscess without bleeding: Secondary | ICD-10-CM | POA: Diagnosis not present

## 2017-03-27 DIAGNOSIS — R079 Chest pain, unspecified: Secondary | ICD-10-CM | POA: Diagnosis not present

## 2017-03-27 LAB — CBC WITH DIFFERENTIAL/PLATELET
BASOS PCT: 0 %
Basophils Absolute: 0 10*3/uL (ref 0.0–0.1)
EOS ABS: 0.1 10*3/uL (ref 0.0–0.7)
EOS PCT: 1 %
HCT: 41.1 % (ref 36.0–46.0)
Hemoglobin: 13.4 g/dL (ref 12.0–15.0)
Lymphocytes Relative: 32 %
Lymphs Abs: 1.4 10*3/uL (ref 0.7–4.0)
MCH: 27.6 pg (ref 26.0–34.0)
MCHC: 32.6 g/dL (ref 30.0–36.0)
MCV: 84.7 fL (ref 78.0–100.0)
MONO ABS: 0.9 10*3/uL (ref 0.1–1.0)
Monocytes Relative: 19 %
Neutro Abs: 2.2 10*3/uL (ref 1.7–7.7)
Neutrophils Relative %: 48 %
PLATELETS: 223 10*3/uL (ref 150–400)
RBC: 4.85 MIL/uL (ref 3.87–5.11)
RDW: 12.7 % (ref 11.5–15.5)
WBC: 4.5 10*3/uL (ref 4.0–10.5)

## 2017-03-27 LAB — URINALYSIS, ROUTINE W REFLEX MICROSCOPIC
BILIRUBIN URINE: NEGATIVE
Glucose, UA: NEGATIVE mg/dL
KETONES UR: NEGATIVE mg/dL
NITRITE: NEGATIVE
PROTEIN: 30 mg/dL — AB
Specific Gravity, Urine: 1.024 (ref 1.005–1.030)
pH: 5 (ref 5.0–8.0)

## 2017-03-27 LAB — COMPREHENSIVE METABOLIC PANEL
ALBUMIN: 3.5 g/dL (ref 3.5–5.0)
ALK PHOS: 75 U/L (ref 38–126)
ALT: 10 U/L — ABNORMAL LOW (ref 14–54)
AST: 15 U/L (ref 15–41)
Anion gap: 10 (ref 5–15)
BILIRUBIN TOTAL: 0.4 mg/dL (ref 0.3–1.2)
BUN: 8 mg/dL (ref 6–20)
CALCIUM: 8.8 mg/dL — AB (ref 8.9–10.3)
CO2: 23 mmol/L (ref 22–32)
Chloride: 103 mmol/L (ref 101–111)
Creatinine, Ser: 1.07 mg/dL — ABNORMAL HIGH (ref 0.44–1.00)
GFR calc Af Amer: >60 mL/min (ref >60)
GFR, EST NON AFRICAN AMERICAN: 52 mL/min — AB (ref >60)
GLUCOSE: 102 mg/dL — AB (ref 65–99)
Potassium: 4.1 mmol/L (ref 3.5–5.1)
Sodium: 136 mmol/L (ref 135–145)
TOTAL PROTEIN: 7.6 g/dL (ref 6.5–8.1)

## 2017-03-27 LAB — I-STAT CG4 LACTIC ACID, ED: Lactic Acid, Venous: 1.14 mmol/L (ref 0.5–1.9)

## 2017-03-27 MED ORDER — FENTANYL CITRATE (PF) 100 MCG/2ML IJ SOLN: 50.0000 ug | INTRAMUSCULAR | Status: DC | PRN

## 2017-03-27 MED ORDER — FENTANYL CITRATE (PF) 100 MCG/2ML IJ SOLN
INTRAMUSCULAR | Status: AC
  Administered 2017-03-27: 100 ug
  Filled 2017-03-27: qty 2

## 2017-03-27 NOTE — Discharge Instructions (Signed)
Please go straight to ED

## 2017-03-27 NOTE — ED Notes (Signed)
Pt was sent by [facility], also reports N/V

## 2017-03-27 NOTE — ED Notes (Signed)
Pt requesting pain medication; reports  Allergy to ibuprofen and tylenol; fentanyl given in triage without improvement. Informed pt of pain protocol and allergy intolerance. Pt expressed understanding

## 2017-03-27 NOTE — ED Notes (Signed)
Pt reports fever, abd pain, lower back pain, body aches X2-3 days. Pt appears to be in discomfort in triage. States highest fever at home 103.1.

## 2017-03-27 NOTE — ED Provider Notes (Signed)
  [place]   [id number] 03/27/17 Arrival Time: 6283  ASSESSMENT & PLAN:  1. Acute abdominal pain   2. Fever, unspecified     No orders of the defined types were placed in this encounter.  Go to ED for higher level of care.  Reviewed expectations re: course of current medical issues. Questions answered. Outlined signs and symptoms indicating need for more acute intervention. Patient verbalized understanding. After Visit Summary given.   SUBJECTIVE:  Waylynn A Haughton is a 66 y.o. female who presents with complaint of abdominal pain and fever for 3 days.  Her TMAX is 103. She is having severe pain.  ROS: As per HPI.   OBJECTIVE:  Vitals:   03/27/17 1831  BP: (!) 155/72  Pulse: 99  Resp: 20  Temp: 99.5 F (37.5 C)  TempSrc: Oral  SpO2: 100%     General appearance: WC boun AA female in NAD HEENT: normocephalic; atraumatic; conjunctivae normal;  nasal mucosa normal; oral mucosa normal Neck: supple Lungs: clear to auscultation bilaterally Heart: regular rate and rhythm Abdomen: Guarding with palpation to mid abdomen; bowel sounds hypoactive Neurologic: normal symmetric reflexes; normal gait Psychological:  alert and cooperative; normal mood and affect    Labs Reviewed - No data to display  No results found.  Allergies  Allergen Reactions  . Aspirin Other (See Comments)    Stomach bleeding  . Ibuprofen Other (See Comments)    Stomach bleeding  . Mushroom Extract Complex Hives and Itching  . Shellfish Allergy Hives and Itching  . Sulfa Antibiotics Hives and Itching  . Augmentin [Amoxicillin-Pot Clavulanate] Itching    Has patient had a PCN reaction causing immediate rash, facial/tongue/throat swelling, SOB or lightheadedness with hypotension: no Has patient had a PCN reaction causing severe rash involving mucus membranes or skin necrosis: unknown Has patient had a PCN reaction that required hospitalization : no Has patient had a PCN reaction  occurring within the last 10 years: yes If all of the above answers are "NO", then may proceed with Cephalosporin use.  Has never had problems with amoxicillin/ penicillin  . Betadine [Povidone Iodine] Itching  . Coconut Flavor Itching  . Codeine Itching  . Eggs Or Egg-Derived Products Nausea And Vomiting  . Iodine Itching  . Ivp Dye [Iodinated Diagnostic Agents] Hives    Takes Benadryl 50mg  PO before receiving iodinated contrast     PMHx, SurgHx, SocialHx, Medications, and Allergies were reviewed in the Visit Navigator and updated as appropriate.      Lysbeth Penner, [place] 03/27/17 [phone]

## 2017-03-27 NOTE — ED Triage Notes (Signed)
The patient presented to the [facility] with a complaint of abdominal pain with N/V x 3 days. The patient also reported a fever the last 3 days off and on.

## 2017-03-28 ENCOUNTER — Emergency Department (HOSPITAL_COMMUNITY)
Admission: EM | Admit: 2017-03-28 | Discharge: 2017-03-28 | Disposition: A | Discharge status: Home / Self Care | Payer: Medicare Other | Attending: Emergency Medicine | Admitting: Emergency Medicine

## 2017-03-28 ENCOUNTER — Emergency Department (HOSPITAL_COMMUNITY): Payer: Medicare Other

## 2017-03-28 DIAGNOSIS — R1030 Lower abdominal pain, unspecified: Secondary | ICD-10-CM

## 2017-03-28 DIAGNOSIS — K5730 Diverticulosis of large intestine without perforation or abscess without bleeding: Secondary | ICD-10-CM | POA: Diagnosis not present

## 2017-03-28 MED ORDER — BARIUM SULFATE 2.1 % PO SUSP
ORAL | Status: AC
  Filled 2017-03-28: qty 1

## 2017-03-28 MED ORDER — METRONIDAZOLE 500 MG PO TABS: 500.0000 mg | ORAL_TABLET | Freq: Three times a day (TID) | ORAL | 0 refills | Status: DC

## 2017-03-28 MED ORDER — OXYCODONE-ACETAMINOPHEN 5-325 MG PO TABS: 1.0000 | ORAL_TABLET | Freq: Four times a day (QID) | ORAL | 0 refills | Status: DC | PRN

## 2017-03-28 MED ORDER — ONDANSETRON HCL 4 MG/2ML IJ SOLN
4.0000 mg | Freq: Once | INTRAMUSCULAR | Status: AC
  Administered 2017-03-28: 4 mg via INTRAVENOUS
  Filled 2017-03-28: qty 2

## 2017-03-28 MED ORDER — CIPROFLOXACIN HCL 500 MG PO TABS
500.0000 mg | ORAL_TABLET | Freq: Two times a day (BID) | ORAL | 0 refills | Status: DC
Start: 2017-03-28 — End: 2017-05-18

## 2017-03-28 MED ORDER — MORPHINE SULFATE (PF) 4 MG/ML IV SOLN
4.0000 mg | Freq: Once | INTRAVENOUS | Status: AC
  Administered 2017-03-28: 4 mg via INTRAVENOUS
  Filled 2017-03-28: qty 1

## 2017-03-28 MED ORDER — ONDANSETRON HCL 4 MG PO TABS: 4.0000 mg | ORAL_TABLET | Freq: Four times a day (QID) | ORAL | 0 refills | Status: AC | PRN

## 2017-03-28 MED ORDER — METRONIDAZOLE 500 MG PO TABS: 500.0000 mg | ORAL_TABLET | Freq: Once | ORAL | Status: DC

## 2017-03-28 MED ORDER — CIPROFLOXACIN HCL 500 MG PO TABS: 500.0000 mg | ORAL_TABLET | Freq: Once | ORAL | Status: DC

## 2017-03-28 MED ORDER — SODIUM CHLORIDE 0.9 % IV BOLUS (SEPSIS)
1000.0000 mL | Freq: Once | INTRAVENOUS | Status: AC
  Administered 2017-03-28: 1000 mL via INTRAVENOUS
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
## 2017-03-28 NOTE — ED Notes (Signed)
Transported to CT 

## 2017-03-28 NOTE — Discharge Instructions (Signed)
Return if pain is getting worse or if you start vomiting.

## 2017-03-28 NOTE — ED Notes (Signed)
Patient resting on stretcher aware she is waiting on results.

## 2017-04-22 DIAGNOSIS — M4804 Spinal stenosis, thoracic region: Secondary | ICD-10-CM | POA: Diagnosis not present

## 2017-04-22 DIAGNOSIS — M4316 Spondylolisthesis, lumbar region: Secondary | ICD-10-CM | POA: Diagnosis not present

## 2017-04-25 DIAGNOSIS — H35352 Cystoid macular degeneration, left eye: Secondary | ICD-10-CM | POA: Diagnosis not present

## 2017-05-07 DIAGNOSIS — M4804 Spinal stenosis, thoracic region: Secondary | ICD-10-CM | POA: Diagnosis not present

## 2017-05-16 DIAGNOSIS — I10 Essential (primary) hypertension: Secondary | ICD-10-CM | POA: Diagnosis not present

## 2017-05-16 DIAGNOSIS — R7301 Impaired fasting glucose: Secondary | ICD-10-CM | POA: Diagnosis not present

## 2017-05-16 DIAGNOSIS — E784 Other hyperlipidemia: Secondary | ICD-10-CM | POA: Diagnosis not present

## 2017-05-16 DIAGNOSIS — M545 Low back pain: Secondary | ICD-10-CM | POA: Diagnosis not present

## 2017-05-16 DIAGNOSIS — Z6841 Body Mass Index (BMI) 40.0 and over, adult: Secondary | ICD-10-CM | POA: Diagnosis not present

## 2017-05-16 DIAGNOSIS — Z23 Encounter for immunization: Secondary | ICD-10-CM | POA: Diagnosis not present

## 2017-05-16 DIAGNOSIS — R2681 Unsteadiness on feet: Secondary | ICD-10-CM | POA: Diagnosis not present

## 2017-05-18 ENCOUNTER — Encounter (HOSPITAL_COMMUNITY): Payer: Self-pay | Admitting: Emergency Medicine

## 2017-05-18 ENCOUNTER — Emergency Department (HOSPITAL_COMMUNITY): Payer: Medicare Other | Admitting: Anesthesiology

## 2017-05-18 ENCOUNTER — Emergency Department (HOSPITAL_COMMUNITY): Payer: Medicare Other

## 2017-05-18 ENCOUNTER — Encounter (HOSPITAL_COMMUNITY)
Admission: EM | Disposition: A | Discharge status: Home Health | Payer: Self-pay | Source: Home / Self Care | Attending: Neurological Surgery

## 2017-05-18 ENCOUNTER — Inpatient Hospital Stay (HOSPITAL_COMMUNITY)
Admission: EM | Admit: 2017-05-18 | Discharge: 2017-05-24 | DRG: 515 | Disposition: A | Discharge status: Home Health | Payer: Medicare Other | Attending: Neurological Surgery | Admitting: Neurological Surgery

## 2017-05-18 DIAGNOSIS — Z8661 Personal history of infections of the central nervous system: Secondary | ICD-10-CM

## 2017-05-18 DIAGNOSIS — Z8249 Family history of ischemic heart disease and other diseases of the circulatory system: Secondary | ICD-10-CM | POA: Diagnosis not present

## 2017-05-18 DIAGNOSIS — Y831 Surgical operation with implant of artificial internal device as the cause of abnormal reaction of the patient, or of later complication, without mention of misadventure at the time of the procedure: Secondary | ICD-10-CM | POA: Diagnosis present

## 2017-05-18 DIAGNOSIS — Z91013 Allergy to seafood: Secondary | ICD-10-CM

## 2017-05-18 DIAGNOSIS — T814XXA Infection following a procedure, initial encounter: Secondary | ICD-10-CM | POA: Diagnosis not present

## 2017-05-18 DIAGNOSIS — Z419 Encounter for procedure for purposes other than remedying health state, unspecified: Secondary | ICD-10-CM | POA: Diagnosis not present

## 2017-05-18 DIAGNOSIS — Z981 Arthrodesis status: Secondary | ICD-10-CM | POA: Diagnosis not present

## 2017-05-18 DIAGNOSIS — I5042 Chronic combined systolic (congestive) and diastolic (congestive) heart failure: Secondary | ICD-10-CM | POA: Diagnosis present

## 2017-05-18 DIAGNOSIS — B9689 Other specified bacterial agents as the cause of diseases classified elsewhere: Secondary | ICD-10-CM | POA: Diagnosis present

## 2017-05-18 DIAGNOSIS — G894 Chronic pain syndrome: Secondary | ICD-10-CM | POA: Diagnosis not present

## 2017-05-18 DIAGNOSIS — E7800 Pure hypercholesterolemia, unspecified: Secondary | ICD-10-CM | POA: Diagnosis not present

## 2017-05-18 DIAGNOSIS — Z881 Allergy status to other antibiotic agents status: Secondary | ICD-10-CM | POA: Diagnosis not present

## 2017-05-18 DIAGNOSIS — A499 Bacterial infection, unspecified: Secondary | ICD-10-CM

## 2017-05-18 DIAGNOSIS — R509 Fever, unspecified: Secondary | ICD-10-CM | POA: Diagnosis not present

## 2017-05-18 DIAGNOSIS — Z889 Allergy status to unspecified drugs, medicaments and biological substances status: Secondary | ICD-10-CM | POA: Diagnosis not present

## 2017-05-18 DIAGNOSIS — E785 Hyperlipidemia, unspecified: Secondary | ICD-10-CM | POA: Diagnosis not present

## 2017-05-18 DIAGNOSIS — G062 Extradural and subdural abscess, unspecified: Secondary | ICD-10-CM | POA: Diagnosis present

## 2017-05-18 DIAGNOSIS — T8130XA Disruption of wound, unspecified, initial encounter: Secondary | ICD-10-CM | POA: Diagnosis not present

## 2017-05-18 DIAGNOSIS — Z472 Encounter for removal of internal fixation device: Secondary | ICD-10-CM | POA: Diagnosis not present

## 2017-05-18 DIAGNOSIS — Z96611 Presence of right artificial shoulder joint: Secondary | ICD-10-CM | POA: Diagnosis present

## 2017-05-18 DIAGNOSIS — Z96653 Presence of artificial knee joint, bilateral: Secondary | ICD-10-CM | POA: Diagnosis not present

## 2017-05-18 DIAGNOSIS — I110 Hypertensive heart disease with heart failure: Secondary | ICD-10-CM | POA: Diagnosis present

## 2017-05-18 DIAGNOSIS — Z91012 Allergy to eggs: Secondary | ICD-10-CM

## 2017-05-18 DIAGNOSIS — Z882 Allergy status to sulfonamides status: Secondary | ICD-10-CM

## 2017-05-18 DIAGNOSIS — R1310 Dysphagia, unspecified: Secondary | ICD-10-CM | POA: Diagnosis not present

## 2017-05-18 DIAGNOSIS — I5043 Acute on chronic combined systolic (congestive) and diastolic (congestive) heart failure: Secondary | ICD-10-CM | POA: Diagnosis not present

## 2017-05-18 DIAGNOSIS — Z886 Allergy status to analgesic agent status: Secondary | ICD-10-CM

## 2017-05-18 DIAGNOSIS — M545 Low back pain: Secondary | ICD-10-CM | POA: Diagnosis not present

## 2017-05-18 DIAGNOSIS — Z91041 Radiographic dye allergy status: Secondary | ICD-10-CM

## 2017-05-18 DIAGNOSIS — T8463XA Infection and inflammatory reaction due to internal fixation device of spine, initial encounter: Principal | ICD-10-CM | POA: Diagnosis present

## 2017-05-18 DIAGNOSIS — K219 Gastro-esophageal reflux disease without esophagitis: Secondary | ICD-10-CM | POA: Diagnosis present

## 2017-05-18 DIAGNOSIS — M4326 Fusion of spine, lumbar region: Secondary | ICD-10-CM | POA: Diagnosis not present

## 2017-05-18 DIAGNOSIS — G061 Intraspinal abscess and granuloma: Secondary | ICD-10-CM | POA: Diagnosis present

## 2017-05-18 HISTORY — PX: LUMBAR LAMINECTOMY/DECOMPRESSION MICRODISCECTOMY: SHX5026

## 2017-05-18 LAB — COMPREHENSIVE METABOLIC PANEL
ALBUMIN: 3.4 g/dL — AB (ref 3.5–5.0)
ALK PHOS: 73 U/L (ref 38–126)
ALT: 9 U/L — AB (ref 14–54)
AST: 18 U/L (ref 15–41)
Anion gap: 8 (ref 5–15)
BUN: 7 mg/dL (ref 6–20)
CALCIUM: 8.5 mg/dL — AB (ref 8.9–10.3)
CHLORIDE: 104 mmol/L (ref 101–111)
CO2: 24 mmol/L (ref 22–32)
CREATININE: 1.07 mg/dL — AB (ref 0.44–1.00)
GFR calc Af Amer: >60 mL/min (ref >60)
GFR calc non Af Amer: 52 mL/min — ABNORMAL LOW (ref >60)
GLUCOSE: 157 mg/dL — AB (ref 65–99)
Potassium: 3.5 mmol/L (ref 3.5–5.1)
SODIUM: 136 mmol/L (ref 135–145)
Total Bilirubin: 0.5 mg/dL (ref 0.3–1.2)
Total Protein: 7.1 g/dL (ref 6.5–8.1)

## 2017-05-18 LAB — URINALYSIS, ROUTINE W REFLEX MICROSCOPIC
Bilirubin Urine: NEGATIVE
GLUCOSE, UA: NEGATIVE mg/dL
HGB URINE DIPSTICK: NEGATIVE
Ketones, ur: NEGATIVE mg/dL
NITRITE: NEGATIVE
PROTEIN: NEGATIVE mg/dL
SPECIFIC GRAVITY, URINE: 1.024 (ref 1.005–1.030)
pH: 5 (ref 5.0–8.0)

## 2017-05-18 LAB — CBC WITH DIFFERENTIAL/PLATELET
Basophils Absolute: 0 10*3/uL (ref 0.0–0.1)
Basophils Relative: 0 %
EOS ABS: 0.1 10*3/uL (ref 0.0–0.7)
Eosinophils Relative: 2 %
HCT: 39.1 % (ref 36.0–46.0)
Hemoglobin: 12.1 g/dL (ref 12.0–15.0)
LYMPHS ABS: 1.6 10*3/uL (ref 0.7–4.0)
Lymphocytes Relative: 48 %
MCH: 26.5 pg (ref 26.0–34.0)
MCHC: 30.9 g/dL (ref 30.0–36.0)
MCV: 85.7 fL (ref 78.0–100.0)
MONO ABS: 0.6 10*3/uL (ref 0.1–1.0)
MONOS PCT: 17 %
Neutro Abs: 1.1 10*3/uL — ABNORMAL LOW (ref 1.7–7.7)
Neutrophils Relative %: 33 %
Platelets: 166 10*3/uL (ref 150–400)
RBC: 4.56 MIL/uL (ref 3.87–5.11)
RDW: 14.3 % (ref 11.5–15.5)
WBC: 3.4 10*3/uL — ABNORMAL LOW (ref 4.0–10.5)

## 2017-05-18 LAB — C-REACTIVE PROTEIN: CRP: 6.1 mg/dL — ABNORMAL HIGH (ref <1.0)

## 2017-05-18 LAB — I-STAT CG4 LACTIC ACID, ED: Lactic Acid, Venous: 1.58 mmol/L (ref 0.5–1.9)

## 2017-05-18 LAB — SEDIMENTATION RATE: SED RATE: 61 mm/h — AB (ref 0–22)

## 2017-05-18 LAB — GLUCOSE, CAPILLARY: GLUCOSE-CAPILLARY: 133 mg/dL — AB (ref 65–99)

## 2017-05-18 SURGERY — LUMBAR LAMINECTOMY/DECOMPRESSION MICRODISCECTOMY 4 LEVEL
Anesthesia: General | Site: Back

## 2017-05-18 MED ORDER — METHOCARBAMOL 1000 MG/10ML IJ SOLN
500.0000 mg | Freq: Four times a day (QID) | INTRAVENOUS | Status: DC | PRN
  Administered 2017-05-18 – 2017-05-23 (×2): 500 mg via INTRAVENOUS
  Filled 2017-05-18 (×2): qty 5

## 2017-05-18 MED ORDER — ONDANSETRON HCL 4 MG/2ML IJ SOLN
4.0000 mg | Freq: Four times a day (QID) | INTRAMUSCULAR | Status: DC | PRN
  Administered 2017-05-19 – 2017-05-23 (×5): 4 mg via INTRAVENOUS
  Filled 2017-05-18 (×5): qty 2

## 2017-05-18 MED ORDER — PROMETHAZINE HCL 25 MG/ML IJ SOLN: 6.2500 mg | INTRAMUSCULAR | Status: DC | PRN

## 2017-05-18 MED ORDER — FENTANYL CITRATE (PF) 250 MCG/5ML IJ SOLN
INTRAMUSCULAR | Status: AC
  Filled 2017-05-18: qty 5

## 2017-05-18 MED ORDER — PHENOL 1.4 % MT LIQD: 1.0000 | OROMUCOSAL | Status: DC | PRN

## 2017-05-18 MED ORDER — SIMVASTATIN 20 MG PO TABS: 20.0000 mg | ORAL_TABLET | Freq: Every evening | ORAL | Status: DC

## 2017-05-18 MED ORDER — HYDROMORPHONE HCL 1 MG/ML IJ SOLN
INTRAMUSCULAR | Status: AC
  Filled 2017-05-18: qty 1

## 2017-05-18 MED ORDER — ROCURONIUM BROMIDE 100 MG/10ML IV SOLN
INTRAVENOUS | Status: DC | PRN
  Administered 2017-05-18: 30 mg via INTRAVENOUS
  Administered 2017-05-18: 10 mg via INTRAVENOUS
  Administered 2017-05-18: 50 mg via INTRAVENOUS

## 2017-05-18 MED ORDER — PHENYLEPHRINE HCL 10 MG/ML IJ SOLN
INTRAVENOUS | Status: DC | PRN
  Administered 2017-05-18: 25 ug/min via INTRAVENOUS

## 2017-05-18 MED ORDER — THROMBIN 20000 UNITS EX SOLR
CUTANEOUS | Status: AC
  Filled 2017-05-18: qty 20000

## 2017-05-18 MED ORDER — PHENYLEPHRINE HCL 10 MG/ML IJ SOLN
INTRAMUSCULAR | Status: DC | PRN
  Administered 2017-05-18 (×3): 80 ug via INTRAVENOUS

## 2017-05-18 MED ORDER — DOCUSATE SODIUM 100 MG PO CAPS
100.0000 mg | ORAL_CAPSULE | Freq: Two times a day (BID) | ORAL | Status: DC
  Administered 2017-05-18 – 2017-05-24 (×9): 100 mg via ORAL
  Filled 2017-05-18 (×10): qty 1

## 2017-05-18 MED ORDER — METOPROLOL TARTRATE 12.5 MG HALF TABLET
ORAL_TABLET | ORAL | Status: AC
  Filled 2017-05-18: qty 2

## 2017-05-18 MED ORDER — ONDANSETRON HCL 4 MG PO TABS: 4.0000 mg | ORAL_TABLET | Freq: Four times a day (QID) | ORAL | Status: DC | PRN

## 2017-05-18 MED ORDER — MIDAZOLAM HCL 5 MG/5ML IJ SOLN
INTRAMUSCULAR | Status: DC | PRN
  Administered 2017-05-18: 2 mg via INTRAVENOUS

## 2017-05-18 MED ORDER — POTASSIUM CHLORIDE CRYS ER 20 MEQ PO TBCR: 20.0000 meq | EXTENDED_RELEASE_TABLET | Freq: Two times a day (BID) | ORAL | Status: DC

## 2017-05-18 MED ORDER — GABAPENTIN 300 MG PO CAPS
300.0000 mg | ORAL_CAPSULE | Freq: Three times a day (TID) | ORAL | Status: DC
  Administered 2017-05-18 – 2017-05-24 (×17): 300 mg via ORAL
  Filled 2017-05-18 (×17): qty 1

## 2017-05-18 MED ORDER — SUGAMMADEX SODIUM 200 MG/2ML IV SOLN
INTRAVENOUS | Status: DC | PRN
  Administered 2017-05-18: 245 mg via INTRAVENOUS

## 2017-05-18 MED ORDER — BUPROPION HCL ER (SR) 100 MG PO TB12: 100.0000 mg | ORAL_TABLET | Freq: Every day | ORAL | Status: DC

## 2017-05-18 MED ORDER — OXYCODONE HCL ER 10 MG PO T12A
10.0000 mg | EXTENDED_RELEASE_TABLET | Freq: Two times a day (BID) | ORAL | Status: DC
  Administered 2017-05-18 – 2017-05-24 (×11): 10 mg via ORAL
  Filled 2017-05-18 (×11): qty 1

## 2017-05-18 MED ORDER — VANCOMYCIN HCL IN DEXTROSE 750-5 MG/150ML-% IV SOLN
750.0000 mg | Freq: Two times a day (BID) | INTRAVENOUS | Status: DC
  Administered 2017-05-19 – 2017-05-21 (×5): 750 mg via INTRAVENOUS
  Filled 2017-05-18 (×6): qty 150

## 2017-05-18 MED ORDER — SODIUM CHLORIDE 0.9% FLUSH
3.0000 mL | Freq: Two times a day (BID) | INTRAVENOUS | Status: DC
  Administered 2017-05-19 – 2017-05-24 (×8): 3 mL via INTRAVENOUS

## 2017-05-18 MED ORDER — ACETAMINOPHEN 325 MG PO TABS
650.0000 mg | ORAL_TABLET | ORAL | Status: DC | PRN
  Administered 2017-05-18: 650 mg via ORAL

## 2017-05-18 MED ORDER — PIPERACILLIN-TAZOBACTAM 3.375 G IVPB
3.3750 g | Freq: Three times a day (TID) | INTRAVENOUS | Status: DC
  Administered 2017-05-18 – 2017-05-21 (×8): 3.375 g via INTRAVENOUS
  Filled 2017-05-18 (×9): qty 50

## 2017-05-18 MED ORDER — FLEET ENEMA 7-19 GM/118ML RE ENEM
1.0000 | ENEMA | Freq: Once | RECTAL | Status: AC | PRN
  Administered 2017-05-22: 1 via RECTAL
  Filled 2017-05-18: qty 1

## 2017-05-18 MED ORDER — THROMBIN 5000 UNITS EX SOLR
CUTANEOUS | Status: AC
  Filled 2017-05-18: qty 5000

## 2017-05-18 MED ORDER — SODIUM CHLORIDE 0.9% FLUSH: 3.0000 mL | INTRAVENOUS | Status: DC | PRN

## 2017-05-18 MED ORDER — FENTANYL CITRATE (PF) 100 MCG/2ML IJ SOLN
INTRAMUSCULAR | Status: DC | PRN
  Administered 2017-05-18 (×3): 50 ug via INTRAVENOUS

## 2017-05-18 MED ORDER — GADOBENATE DIMEGLUMINE 529 MG/ML IV SOLN
20.0000 mL | Freq: Once | INTRAVENOUS | Status: AC
  Administered 2017-05-18: 20 mL via INTRAVENOUS

## 2017-05-18 MED ORDER — FUROSEMIDE 20 MG PO TABS: 80.0000 mg | ORAL_TABLET | Freq: Every day | ORAL | Status: DC

## 2017-05-18 MED ORDER — OXYCODONE HCL 5 MG PO TABS
ORAL_TABLET | ORAL | Status: AC
  Administered 2017-05-18: 10 mg via ORAL
  Filled 2017-05-18: qty 2

## 2017-05-18 MED ORDER — VANCOMYCIN HCL 10 G IV SOLR
1500.0000 mg | INTRAVENOUS | Status: AC
  Administered 2017-05-18: 1500 mg via INTRAVENOUS
  Filled 2017-05-18: qty 1500

## 2017-05-18 MED ORDER — BISACODYL 10 MG RE SUPP: 10.0000 mg | Freq: Every day | RECTAL | Status: DC | PRN

## 2017-05-18 MED ORDER — NEOSTIGMINE METHYLSULFATE 10 MG/10ML IV SOLN
INTRAVENOUS | Status: DC | PRN
  Administered 2017-05-18: 4 mg via INTRAVENOUS

## 2017-05-18 MED ORDER — HYDROMORPHONE HCL 1 MG/ML IJ SOLN
0.2500 mg | INTRAMUSCULAR | Status: DC | PRN
  Administered 2017-05-18 (×4): 0.5 mg via INTRAVENOUS

## 2017-05-18 MED ORDER — SODIUM CHLORIDE 0.9 % IV SOLN
INTRAVENOUS | Status: DC
  Administered 2017-05-18 – 2017-05-23 (×2): via INTRAVENOUS

## 2017-05-18 MED ORDER — LIDOCAINE HCL (CARDIAC) 20 MG/ML IV SOLN
INTRAVENOUS | Status: DC | PRN
Start: 2017-05-18 — End: 2017-05-18
  Administered 2017-05-18: 60 mg via INTRAVENOUS

## 2017-05-18 MED ORDER — BUPIVACAINE LIPOSOME 1.3 % IJ SUSP
20.0000 mL | Freq: Once | INTRAMUSCULAR | Status: DC
  Filled 2017-05-18: qty 20

## 2017-05-18 MED ORDER — ALBUTEROL SULFATE HFA 108 (90 BASE) MCG/ACT IN AERS: 2.0000 | INHALATION_SPRAY | RESPIRATORY_TRACT | Status: DC | PRN

## 2017-05-18 MED ORDER — ALBUTEROL SULFATE (2.5 MG/3ML) 0.083% IN NEBU: 2.5000 mg | INHALATION_SOLUTION | Freq: Four times a day (QID) | RESPIRATORY_TRACT | Status: DC | PRN

## 2017-05-18 MED ORDER — LIDOCAINE-EPINEPHRINE 2 %-1:100000 IJ SOLN
INTRAMUSCULAR | Status: AC
  Filled 2017-05-18: qty 1

## 2017-05-18 MED ORDER — THROMBIN 5000 UNITS EX SOLR
OROMUCOSAL | Status: DC | PRN
  Administered 2017-05-18: 5 mL

## 2017-05-18 MED ORDER — OXYCODONE-ACETAMINOPHEN 5-325 MG PO TABS: 1.0000 | ORAL_TABLET | Freq: Four times a day (QID) | ORAL | Status: DC | PRN

## 2017-05-18 MED ORDER — METHOCARBAMOL 500 MG PO TABS
500.0000 mg | ORAL_TABLET | Freq: Four times a day (QID) | ORAL | Status: DC | PRN
  Administered 2017-05-19 – 2017-05-21 (×5): 500 mg via ORAL
  Filled 2017-05-18 (×7): qty 1

## 2017-05-18 MED ORDER — LOSARTAN POTASSIUM-HCTZ 100-12.5 MG PO TABS: 1.0000 | ORAL_TABLET | Freq: Every day | ORAL | Status: DC

## 2017-05-18 MED ORDER — ZOLPIDEM TARTRATE 5 MG PO TABS: 5.0000 mg | ORAL_TABLET | Freq: Every evening | ORAL | Status: DC | PRN

## 2017-05-18 MED ORDER — 0.9 % SODIUM CHLORIDE (POUR BTL) OPTIME
TOPICAL | Status: DC | PRN
  Administered 2017-05-18: 1000 mL

## 2017-05-18 MED ORDER — MIDAZOLAM HCL 2 MG/2ML IJ SOLN
INTRAMUSCULAR | Status: AC
  Filled 2017-05-18: qty 2

## 2017-05-18 MED ORDER — SODIUM CHLORIDE 0.9 % IR SOLN
Status: DC | PRN
  Administered 2017-05-18: 500 mL

## 2017-05-18 MED ORDER — ACETAMINOPHEN 325 MG PO TABS
ORAL_TABLET | ORAL | Status: AC
  Administered 2017-05-18: 650 mg via ORAL
  Filled 2017-05-18: qty 2

## 2017-05-18 MED ORDER — SODIUM CHLORIDE 0.9 % IJ SOLN
INTRAMUSCULAR | Status: DC | PRN
  Administered 2017-05-18 (×2): 10 mL

## 2017-05-18 MED ORDER — CITALOPRAM HYDROBROMIDE 10 MG PO TABS: 40.0000 mg | ORAL_TABLET | Freq: Every day | ORAL | Status: DC

## 2017-05-18 MED ORDER — VANCOMYCIN HCL 1000 MG IV SOLR
INTRAVENOUS | Status: AC
  Filled 2017-05-18: qty 1000

## 2017-05-18 MED ORDER — MENTHOL 3 MG MT LOZG: 1.0000 | LOZENGE | OROMUCOSAL | Status: DC | PRN

## 2017-05-18 MED ORDER — SODIUM CHLORIDE 0.9 % IV SOLN: 250.0000 mL | INTRAVENOUS | Status: DC

## 2017-05-18 MED ORDER — PROPOFOL 10 MG/ML IV BOLUS
INTRAVENOUS | Status: DC | PRN
  Administered 2017-05-18: 150 mg via INTRAVENOUS

## 2017-05-18 MED ORDER — HYDROMORPHONE HCL 1 MG/ML IJ SOLN: 1.0000 mg | INTRAMUSCULAR | Status: DC | PRN

## 2017-05-18 MED ORDER — BUPIVACAINE LIPOSOME 1.3 % IJ SUSP
INTRAMUSCULAR | Status: DC | PRN
  Administered 2017-05-18: 20 mL

## 2017-05-18 MED ORDER — SENNA 8.6 MG PO TABS
1.0000 | ORAL_TABLET | Freq: Two times a day (BID) | ORAL | Status: DC
  Administered 2017-05-18 – 2017-05-24 (×9): 8.6 mg via ORAL
  Filled 2017-05-18 (×10): qty 1

## 2017-05-18 MED ORDER — HYDROMORPHONE HCL 1 MG/ML IJ SOLN
1.0000 mg | INTRAMUSCULAR | Status: DC | PRN
  Administered 2017-05-19 – 2017-05-24 (×9): 1 mg via INTRAVENOUS
  Filled 2017-05-18 (×10): qty 1

## 2017-05-18 MED ORDER — FENTANYL CITRATE (PF) 100 MCG/2ML IJ SOLN
50.0000 ug | Freq: Once | INTRAMUSCULAR | Status: AC
  Administered 2017-05-18: 50 ug via INTRAMUSCULAR
  Filled 2017-05-18: qty 2

## 2017-05-18 MED ORDER — ACETAMINOPHEN 650 MG RE SUPP: 650.0000 mg | RECTAL | Status: DC | PRN

## 2017-05-18 MED ORDER — VANCOMYCIN HCL 1000 MG IV SOLR
INTRAVENOUS | Status: DC | PRN
  Administered 2017-05-18: 1000 mg

## 2017-05-18 MED ORDER — SENNOSIDES-DOCUSATE SODIUM 8.6-50 MG PO TABS: 1.0000 | ORAL_TABLET | Freq: Every evening | ORAL | Status: DC | PRN

## 2017-05-18 MED ORDER — BUPIVACAINE-EPINEPHRINE (PF) 0.5% -1:200000 IJ SOLN
INTRAMUSCULAR | Status: AC
  Filled 2017-05-18: qty 30

## 2017-05-18 MED ORDER — METOPROLOL TARTRATE 25 MG PO TABS
25.0000 mg | ORAL_TABLET | Freq: Every day | ORAL | Status: DC
  Administered 2017-05-18: 25 mg via ORAL

## 2017-05-18 MED ORDER — PANTOPRAZOLE SODIUM 40 MG PO PACK: 40.0000 mg | PACK | Freq: Every day | ORAL | Status: DC

## 2017-05-18 MED ORDER — OXYCODONE HCL 5 MG PO TABS
5.0000 mg | ORAL_TABLET | ORAL | Status: DC | PRN
  Administered 2017-05-18 – 2017-05-19 (×4): 10 mg via ORAL
  Administered 2017-05-20: 5 mg via ORAL
  Administered 2017-05-20 (×2): 10 mg via ORAL
  Administered 2017-05-20 – 2017-05-21 (×2): 5 mg via ORAL
  Administered 2017-05-24 (×2): 10 mg via ORAL
  Filled 2017-05-18 (×3): qty 2
  Filled 2017-05-18: qty 1
  Filled 2017-05-18 (×2): qty 2
  Filled 2017-05-18: qty 1
  Filled 2017-05-18: qty 2
  Filled 2017-05-18 (×2): qty 1
  Filled 2017-05-18: qty 2

## 2017-05-18 MED ORDER — LACTATED RINGERS IV SOLN
INTRAVENOUS | Status: DC
  Administered 2017-05-18 (×3): via INTRAVENOUS

## 2017-05-18 MED ORDER — CLONIDINE HCL 0.3 MG/24HR TD PTWK: 0.3000 mg | MEDICATED_PATCH | TRANSDERMAL | Status: DC

## 2017-05-18 MED ORDER — THROMBIN 20000 UNITS EX SOLR
CUTANEOUS | Status: DC | PRN
  Administered 2017-05-18: 20 mL

## 2017-05-18 SURGICAL SUPPLY — 75 items
ADH SKN CLS APL DERMABOND .7 (GAUZE/BANDAGES/DRESSINGS) ×4
APL SKNCLS STERI-STRIP NONHPOA (GAUZE/BANDAGES/DRESSINGS)
BAG DECANTER FOR FLEXI CONT (MISCELLANEOUS) ×6 IMPLANT
BENZOIN TINCTURE PRP APPL 2/3 (GAUZE/BANDAGES/DRESSINGS) IMPLANT
BIT DRILL NEURO 2X3.1 SFT TUCH (MISCELLANEOUS) ×2 IMPLANT
BLADE CLIPPER SURG (BLADE) IMPLANT
BLADE SURG 11 STRL SS (BLADE) ×6 IMPLANT
BUR MATCHSTICK NEURO 3.0 LAGG (BURR) ×3 IMPLANT
BUR RND OSTEON ELITE 6.0 (BURR) ×2 IMPLANT
BUR ROUND FLUTED 5 RND (BURR) ×4 IMPLANT
CANISTER SUCT 3000ML PPV (MISCELLANEOUS) ×12 IMPLANT
CARTRIDGE OIL MAESTRO DRILL (MISCELLANEOUS) ×4 IMPLANT
CHLORAPREP W/TINT 26ML (MISCELLANEOUS) ×6 IMPLANT
CONT SPEC 4OZ CLIKSEAL STRL BL (MISCELLANEOUS) ×8 IMPLANT
DECANTER SPIKE VIAL GLASS SM (MISCELLANEOUS) ×6 IMPLANT
DERMABOND ADVANCED (GAUZE/BANDAGES/DRESSINGS) ×2
DERMABOND ADVANCED .7 DNX12 (GAUZE/BANDAGES/DRESSINGS) ×4 IMPLANT
DIFFUSER DRILL AIR PNEUMATIC (MISCELLANEOUS) ×6 IMPLANT
DRAPE MICROSCOPE LEICA (MISCELLANEOUS) ×6 IMPLANT
DRAPE POUCH INSTRU U-SHP 10X18 (DRAPES) ×6 IMPLANT
DRAPE SURG 17X23 STRL (DRAPES) ×6 IMPLANT
DRILL NEURO 2X3.1 SOFT TOUCH (MISCELLANEOUS) ×3
DRSG OPSITE POSTOP 4X10 (GAUZE/BANDAGES/DRESSINGS) ×2 IMPLANT
ELECT COATED BLADE 2.86 ST (ELECTRODE) ×6 IMPLANT
ELECT REM PT RETURN 9FT ADLT (ELECTROSURGICAL) ×6
ELECTRODE REM PT RTRN 9FT ADLT (ELECTROSURGICAL) ×4 IMPLANT
GAUZE SPONGE 4X4 12PLY STRL (GAUZE/BANDAGES/DRESSINGS) IMPLANT
GAUZE SPONGE 4X4 16PLY XRAY LF (GAUZE/BANDAGES/DRESSINGS) ×4 IMPLANT
GLOVE BIO SURGEON STRL SZ7 (GLOVE) IMPLANT
GLOVE BIOGEL PI IND STRL 7.0 (GLOVE) IMPLANT
GLOVE BIOGEL PI IND STRL 7.5 (GLOVE) ×4 IMPLANT
GLOVE BIOGEL PI INDICATOR 7.0 (GLOVE)
GLOVE BIOGEL PI INDICATOR 7.5 (GLOVE) ×2
GLOVE SS BIOGEL STRL SZ 7.5 (GLOVE) ×8 IMPLANT
GLOVE SUPERSENSE BIOGEL SZ 7.5 (GLOVE) ×4
GOWN STRL REUS W/ TWL LRG LVL3 (GOWN DISPOSABLE) ×4 IMPLANT
GOWN STRL REUS W/ TWL XL LVL3 (GOWN DISPOSABLE) IMPLANT
GOWN STRL REUS W/TWL LRG LVL3 (GOWN DISPOSABLE) ×6
GOWN STRL REUS W/TWL XL LVL3 (GOWN DISPOSABLE)
GRAFT DURAGEN MATRIX 2WX2L ×2 IMPLANT
HEMOSTAT POWDER KIT SURGIFOAM (HEMOSTASIS) ×6 IMPLANT
KIT BASIN OR (CUSTOM PROCEDURE TRAY) ×6 IMPLANT
KIT ROOM TURNOVER OR (KITS) ×6 IMPLANT
NDL HYPO 18GX1.5 BLUNT FILL (NEEDLE) ×2 IMPLANT
NDL HYPO 21X1.5 SAFETY (NEEDLE) ×4 IMPLANT
NDL HYPO 25X1 1.5 SAFETY (NEEDLE) ×1 IMPLANT
NEEDLE HYPO 18GX1.5 BLUNT FILL (NEEDLE) ×6 IMPLANT
NEEDLE HYPO 21X1.5 SAFETY (NEEDLE) ×12 IMPLANT
NEEDLE HYPO 25X1 1.5 SAFETY (NEEDLE) ×3 IMPLANT
NS IRRIG 1000ML POUR BTL (IV SOLUTION) ×6 IMPLANT
OIL CARTRIDGE MAESTRO DRILL (MISCELLANEOUS) ×6
PACK LAMINECTOMY NEURO (CUSTOM PROCEDURE TRAY) ×6 IMPLANT
PACK UNIVERSAL I (CUSTOM PROCEDURE TRAY) ×6 IMPLANT
PAD ARMBOARD 7.5X6 YLW CONV (MISCELLANEOUS) ×18 IMPLANT
PATTIES SURGICAL .5X1.5 (GAUZE/BANDAGES/DRESSINGS) ×6 IMPLANT
RUBBERBAND STERILE (MISCELLANEOUS) ×12 IMPLANT
SEALANT ADHERUS EXTEND TIP (MISCELLANEOUS) ×2 IMPLANT
SPONGE NEURO XRAY DETECT 1X3 (DISPOSABLE) ×3 IMPLANT
SPONGE SURGIFOAM ABS GEL 100 (HEMOSTASIS) ×3 IMPLANT
SPONGE SURGIFOAM ABS GEL SZ50 (HEMOSTASIS) ×3 IMPLANT
STRIP CLOSURE SKIN 1/2X4 (GAUZE/BANDAGES/DRESSINGS) IMPLANT
SUT ETHILON 2 0 FS 18 (SUTURE) ×6 IMPLANT
SUT STRATAFIX MNCRL+ 3-0 PS-2 (SUTURE)
SUT STRATAFIX MONOCRYL 3-0 (SUTURE)
SUT VIC AB 0 CT1 18XCR BRD8 (SUTURE) ×5 IMPLANT
SUT VIC AB 0 CT1 8-18 (SUTURE) ×6
SUT VIC AB 2-0 CT1 18 (SUTURE) ×9 IMPLANT
SUT VIC AB 4-0 PS2 27 (SUTURE) IMPLANT
SUTURE STRATFX MNCRL+ 3-0 PS-2 (SUTURE) IMPLANT
SYR 30ML LL (SYRINGE) ×12 IMPLANT
SYR 5ML LL (SYRINGE) ×6 IMPLANT
TOWEL GREEN STERILE (TOWEL DISPOSABLE) ×6 IMPLANT
TOWEL GREEN STERILE FF (TOWEL DISPOSABLE) ×6 IMPLANT
TUBE CONNECTING 12X1/4 (SUCTIONS) ×3 IMPLANT
WATER STERILE IRR 1000ML POUR (IV SOLUTION) ×6 IMPLANT

## 2017-05-18 NOTE — Transfer of Care (Signed)
Immediate Anesthesia Transfer of Care Note  Patient: [name]  Procedure(s) Performed: LUMBAR LAMINECTOMY L2-3,L3-4,L4-5 AND L5-S1 REOPERATIVE LAMINECTOMY, Lumbar four - five hardware removal, Lumbar Wound Exploration (N/A Back)  Patient Location: PACU  Anesthesia Type:General  Level of Consciousness: awake, alert  and oriented  Airway & Oxygen Therapy: Patient Spontanous Breathing and Patient connected to nasal cannula oxygen  Post-op Assessment: Report given to RN and Post -op Vital signs reviewed and stable  Post vital signs: Reviewed and stable  Last Vitals:  Vitals:   05/18/17 1407 05/18/17 1740  BP: (!) 152/97   Pulse: 75   Resp:    Temp:  (!) (P) 36.1 C  SpO2:      Last Pain:  Vitals:   05/18/17 0501  TempSrc:   PainSc: 8          Complications: No apparent anesthesia complications
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
## 2017-05-18 NOTE — Anesthesia Postprocedure Evaluation (Signed)
Anesthesia Post Note  Patient: [name]  Procedure(s) Performed: LUMBAR LAMINECTOMY L2-3,L3-4,L4-5 AND L5-S1 REOPERATIVE LAMINECTOMY, Lumbar four - five hardware removal, Lumbar Wound Exploration (N/A Back)     Patient location during evaluation: PACU Anesthesia Type: General Level of consciousness: awake and alert Pain management: pain level controlled Vital Signs Assessment: post-procedure vital signs reviewed and stable Respiratory status: spontaneous breathing, nonlabored ventilation, respiratory function stable and patient connected to nasal cannula oxygen Cardiovascular status: blood pressure returned to baseline and stable Postop Assessment: no apparent nausea or vomiting Anesthetic complications: no    Last Vitals:  Vitals:   05/18/17 1842 05/18/17 1850  BP: 134/65   Pulse: 70   Resp: 16   Temp:  36.6 C  SpO2: 100%     Last Pain:  Vitals:   05/18/17 1840  TempSrc:   PainSc: 9     LLE Motor Response: Responds to commands (05/18/17 1850) LLE Sensation: Full sensation (05/18/17 1850) RLE Motor Response: Responds to commands (05/18/17 1850) RLE Sensation: Full sensation (05/18/17 1850)      Geet Hosking S

## 2017-05-18 NOTE — Anesthesia Procedure Notes (Signed)
Procedure Name: Intubation Date/Time: 05/18/2017 2:26 PM Performed by: Clearnce Sorrel Pre-anesthesia Checklist: Patient identified, Emergency Drugs available, Suction available, Patient being monitored and Timeout performed Patient Re-evaluated:Patient Re-evaluated prior to induction Oxygen Delivery Method: Circle system utilized Preoxygenation: Pre-oxygenation with 100% oxygen Induction Type: IV induction Ventilation: Mask ventilation without difficulty Laryngoscope Size: Mac and 3 Grade View: Grade I Tube size: 7.0 mm Number of attempts: 1 Airway Equipment and Method: Stylet Placement Confirmation: ETT inserted through vocal cords under direct vision,  positive ETCO2 and breath sounds checked- equal and bilateral Secured at: 22 cm Tube secured with: Tape Dental Injury: Teeth and Oropharynx as per pre-operative assessment

## 2017-05-18 NOTE — Anesthesia Preprocedure Evaluation (Signed)
Anesthesia Evaluation  Patient identified by MRN, date of birth, ID band Patient awake    Reviewed: Allergy & Precautions, NPO status , Patient's Chart, lab work & pertinent test results  Airway Mallampati: III  TM Distance: <3 FB Neck ROM: Full    Dental no notable dental hx.    Pulmonary neg pulmonary ROS,    breath sounds clear to auscultation + decreased breath sounds      Cardiovascular hypertension, Normal cardiovascular exam Rhythm:Regular Rate:Normal  Left ventricle: The cavity size was normal. Wall thickness was   normal. The estimated ejection fraction was 50%. Diffuse   hypokinesis. Doppler parameters are consistent with abnormal left   ventricular relaxation (grade 1 diastolic dysfunction). - Aortic valve: There was no stenosis. - Mitral valve: Mildly calcified annulus. Normal thickness leaflets   . There was no significant regurgitation. - Right ventricle: The cavity size was normal. Systolic function   was normal. - Tricuspid valve: Peak RV-RA gradient (S): 16 mm Hg. - Pulmonary arteries: PA peak pressure: 19 mm Hg (S). - Inferior vena cava: The vessel was normal in size. The   respirophasic diameter changes were in the normal range (>= 50%),   consistent with normal central venous pressure.  Impressions:  - Normal LV size with mild diffuse hypokinesis, EF 50%. Normal RV   size and systolic function. No significant valvular   abnormalities.    Neuro/Psych negative neurological ROS  negative psych ROS   GI/Hepatic negative GI ROS, Neg liver ROS,   Endo/Other  Morbid obesity  Renal/GU   negative genitourinary   Musculoskeletal negative musculoskeletal ROS (+)   Abdominal (+) + obese,   Peds negative pediatric ROS (+)  Hematology negative hematology ROS (+)   Anesthesia Other Findings   Reproductive/Obstetrics negative OB ROS                             Anesthesia  Physical Anesthesia Plan  ASA: III and emergent  Anesthesia Plan: General   Post-op Pain Management:    Induction: Intravenous  PONV Risk Score and Plan: 2 and Ondansetron, Treatment may vary due to age or medical condition and Dexamethasone  Airway Management Planned: Oral ETT and Video Laryngoscope Planned  Additional Equipment:   Intra-op Plan:   Post-operative Plan: Possible Post-op intubation/ventilation  Informed Consent: I have reviewed the patients History and Physical, chart, labs and discussed the procedure including the risks, benefits and alternatives for the proposed anesthesia with the patient or authorized representative who has indicated his/her understanding and acceptance.   Dental advisory given  Plan Discussed with: CRNA and Surgeon  Anesthesia Plan Comments:         Anesthesia Quick Evaluation

## 2017-05-18 NOTE — ED Notes (Signed)
Pt and family agree that pt belongings will go with family member Oneal Grout (daughter).

## 2017-05-18 NOTE — ED Notes (Signed)
Patient transported to MRI 

## 2017-05-18 NOTE — Progress Notes (Signed)
ANTIBIOTIC CONSULT NOTE - INITIAL  Pharmacy Consult for Zosyn + Vanco Indication: epidural abscess  Allergies  Allergen Reactions  . Aspirin Other (See Comments)    Stomach bleeding  . Ibuprofen Other (See Comments)    Stomach bleeding  . Mushroom Extract Complex Hives and Itching  . Shellfish Allergy Hives and Itching  . Sulfa Antibiotics Hives and Itching  . Augmentin [Amoxicillin-Pot Clavulanate] Itching    Has patient had a PCN reaction causing immediate rash, facial/tongue/throat swelling, SOB or lightheadedness with hypotension: no Has patient had a PCN reaction causing severe rash involving mucus membranes or skin necrosis: unknown Has patient had a PCN reaction that required hospitalization : no Has patient had a PCN reaction occurring within the last 10 years: yes If all of the above answers are "NO", then may proceed with Cephalosporin use.  Has never had problems with amoxicillin/ penicillin  . Ciprofloxacin Other (See Comments)    Made "bottom" raw  . Metronidazole Other (See Comments)    Made "bottom" raw  . Tylenol [Acetaminophen] Itching  . Betadine [Povidone Iodine] Itching  . Coconut Flavor Itching  . Codeine Itching  . Eggs Or Egg-Derived Products Nausea And Vomiting  . Iodine Itching  . Ivp Dye [Iodinated Diagnostic Agents] Hives    Takes Benadryl 50mg  PO before receiving iodinated contrast     Patient Measurements: Height: 5\' 3"  (160 cm) Weight: 270 lb (122.5 kg) IBW/kg (Calculated) : 52.4 Adjusted Body Weight:    Vital Signs: Temp: 98.8 F (37.1 C) (09/30 2052) BP: 144/60 (09/30 2052) Pulse Rate: 69 (09/30 2052) Intake/Output from previous day: No intake/output data recorded. Intake/Output from this shift: No intake/output data recorded.  Labs:  Recent Labs  05/18/17 0047  WBC 3.4*  HGB 12.1  PLT 166  CREATININE 1.07*   Estimated Creatinine Clearance: 63.9 mL/min (A) (by C-G formula based on SCr of 1.07 mg/dL (H)). No results for  input(s): VANCOTROUGH, VANCOPEAK, VANCORANDOM, GENTTROUGH, GENTPEAK, GENTRANDOM, TOBRATROUGH, TOBRAPEAK, TOBRARND, AMIKACINPEAK, AMIKACINTROU, AMIKACIN in the last 72 hours.   Microbiology:   Medical History: Past Medical History:  Diagnosis Date  . Anxiety   . Arthritis    "just about qwhere" (03/17/2015)  . CHF (congestive heart failure) ([place])    [name].......LAST OFFICE VISIT WAS A FEW AGO  . CHF (congestive heart failure) ([place]) 03/06/2015  . Chronic bronchitis ([place])    "just about q yr; use nebulizer prn" (03/17/2015)  . Chronic lower back pain   . Complication of anesthesia    @ [place].....COULDN'T GET HER AWAKE....FOR  HERNIA SURGERY... PLACED ON VENTILATOR; woke up during cyst excision OR on my back"; 07/06/09 [facility]: re-intubated in PACU due to hypoventilation, extubated POD#1  . Cyst of right kidney   . Discitis of lumbosacral region 03/06/2015  . 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 GERD (gastroesophageal reflux disease)    takes nexium  . Heart palpitations    takes Metoprolol  . Herpes simplex infection    LEFT  EYE----2 YR AGO  . History of pneumonia   . 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 Hypercholesteremia   . Hypertension    dr [name]  . IBS (irritable bowel syndrome) 03/06/2015  . Klebsiella infection 02/28/2016  . Proteus infection 02/28/2016  . Pruritic condition 04/15/2016  . Sleep difficulties    pt. states she had sleep eval > 10 yrs. ago in [place], no apnea found  . 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 denies  . Zoster 03/06/2015  Assessment:  ID: abx for lumbar epidural abscess s/p removal of hardware 9/30 PM. 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 WBC 3.4, AF  Vancomycin 9/30>> Zosyn 9/30>>  9/30 wound>>  Goal of Therapy:  Eradication of infection  Plan:  Add Zosyn 3.375g IV q 8hr (h/o itching with Augmentin noted)  Kia Stavros S. Alford Highland, PharmD, BCPS Clinical Staff Pharmacist Pager [phone]  Eilene Ghazi  [place] 05/18/2017,9:04 PM

## 2017-05-18 NOTE — Progress Notes (Signed)
Pharmacy Antibiotic Note  [name] is a 66 y.o. female admitted on 05/18/2017 with wound infection.    Plan: Vanc 750 mg q12h starting 0300 Monitor renal fx, cx, vt prn  Height: 5\' 3"  (160 cm) Weight: 270 lb (122.5 kg) IBW/kg (Calculated) : 52.4  Temp (24hrs), Avg:97.8 F (36.6 C), Min:97 F (36.1 C), Max:98.3 F (36.8 C)   Recent Labs Lab 05/18/17 0047 05/18/17 0417  WBC 3.4*  --   CREATININE 1.07*  --   LATICACIDVEN  --  1.58    Estimated Creatinine Clearance: 63.9 mL/min (A) (by C-G formula based on SCr of 1.07 mg/dL (H)).    Levester Fresh, PharmD, BCPS, BCCCP Clinical Pharmacist Clinical phone for 05/18/2017 from 7a-3:30p: [phone] If after 3:30p, please call main pharmacy at: [phone] 05/18/2017 7:27 PM

## 2017-05-18 NOTE — ED Notes (Addendum)
Attempted IV insertion unsuccessfully x 2. Will notify OR.

## 2017-05-18 NOTE — Progress Notes (Signed)
Surgery complicate by CSF leak Patient is to remain flat for 72 hours
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
## 2017-05-18 NOTE — ED Triage Notes (Addendum)
Pt reports drainage from wound to low back reoccurring tonight. Pt states significant infection after surgery last year but has had no issue since then. Pt reports temp of 101 intermittent recently. Pt states surgeon has MRI ordered for Tuesday d/t increased pain Clear drainage noted from mid low back, unable to locate open area to old surgical site. Bandage applied

## 2017-05-18 NOTE — ED Notes (Signed)
Neuro at bedside at this time.

## 2017-05-18 NOTE — Brief Op Note (Signed)
05/18/2017  5:11 PM  PATIENT:  [name]  66 y.o. female  PRE-OPERATIVE DIAGNOSIS:  Lumbar Epidural Abscess  POST-OPERATIVE DIAGNOSIS:  Lumbar Epidural Abscess  PROCEDURE:  Procedure(s): LUMBAR LAMINECTOMY L2-3,L3-4,L4-5 AND L5-S1 REOPERATIVE LAMINECTOMY, Lumbar four - five hardware removal, Lumbar Wound Exploration (N/A)  SURGEON:  Surgeon(s) and Role:    * Ditty, Kevan Ny, MD - Primary  ASSISTANTS: Ferne Reus, PA-C   ANESTHESIA:   general  EBL:  Total I/O In: 1000 [I.V.:1000] Out: 750 [Urine:250; Blood:500]  BLOOD ADMINISTERED:none  DRAINS: none   LOCAL MEDICATIONS USED:  MARCAINE    and BUPIVICAINE   SPECIMEN:  Source of Specimen:  Lumbar epidural space  DISPOSITION OF SPECIMEN:  Gram stain, culture  COUNTS:  YES  TOURNIQUET:  * No tourniquets in log *  DICTATION: .Note written in EPIC  PLAN OF CARE: Admit to inpatient   PATIENT DISPOSITION:  PACU - hemodynamically stable.   Delay start of Pharmacological VTE agent (>24hrs) due to surgical blood loss or risk of bleeding: yes

## 2017-05-19 ENCOUNTER — Encounter (HOSPITAL_COMMUNITY): Payer: Self-pay | Admitting: Neurological Surgery

## 2017-05-19 LAB — BASIC METABOLIC PANEL
Anion gap: 6 (ref 5–15)
BUN: 7 mg/dL (ref 6–20)
CO2: 25 mmol/L (ref 22–32)
Calcium: 7.8 mg/dL — ABNORMAL LOW (ref 8.9–10.3)
Chloride: 106 mmol/L (ref 101–111)
Creatinine, Ser: 1.18 mg/dL — ABNORMAL HIGH (ref 0.44–1.00)
GFR calc Af Amer: 54 mL/min — ABNORMAL LOW (ref >60)
GFR calc non Af Amer: 46 mL/min — ABNORMAL LOW (ref >60)
Glucose, Bld: 170 mg/dL — ABNORMAL HIGH (ref 65–99)
Potassium: 4.4 mmol/L (ref 3.5–5.1)
Sodium: 137 mmol/L (ref 135–145)

## 2017-05-19 LAB — CG4 I-STAT (LACTIC ACID): LACTIC ACID, VENOUS: 1.86 mmol/L (ref 0.5–1.9)

## 2017-05-19 LAB — CBC
HCT: 33.5 % — ABNORMAL LOW (ref 36.0–46.0)
Hemoglobin: 10.3 g/dL — ABNORMAL LOW (ref 12.0–15.0)
MCH: 27 pg (ref 26.0–34.0)
MCHC: 30.7 g/dL (ref 30.0–36.0)
MCV: 87.7 fL (ref 78.0–100.0)
Platelets: 167 10*3/uL (ref 150–400)
RBC: 3.82 MIL/uL — ABNORMAL LOW (ref 3.87–5.11)
RDW: 14 % (ref 11.5–15.5)
WBC: 11.8 10*3/uL — ABNORMAL HIGH (ref 4.0–10.5)

## 2017-05-19 LAB — PROTIME-INR
INR: 1.16
Prothrombin Time: 14.7 s (ref 11.4–15.2)

## 2017-05-19 LAB — APTT: aPTT: 30 s (ref 24–36)

## 2017-05-19 MED ORDER — ACETAMINOPHEN 325 MG PO TABS
650.0000 mg | ORAL_TABLET | Freq: Four times a day (QID) | ORAL | Status: DC | PRN
  Administered 2017-05-19 – 2017-05-20 (×2): 650 mg via ORAL
  Filled 2017-05-19 (×2): qty 2

## 2017-05-19 MED ORDER — SODIUM CHLORIDE 0.9 % IV BOLUS (SEPSIS)
500.0000 mL | Freq: Once | INTRAVENOUS | Status: AC
  Administered 2017-05-19: 500 mL via INTRAVENOUS

## 2017-05-19 NOTE — Progress Notes (Addendum)
Patient's current oral temperature is 101.5  RN contacted provider - new orders received   Patient educated importance of using incentive spirometry - patient advised that she understood.    RN will continue to monitor patient

## 2017-05-19 NOTE — Progress Notes (Addendum)
OT Cancellation    05/19/17 1000  OT Visit Information  Last OT Received On 05/19/17  Reason Eval/Treat Not Completed Medical issues which prohibited therapy (Strict bedrest post surgery. Will return when medically ready for OT evaluation. Thank you)   [place], OTR/L Acute Rehab Pager: [phone] Office: [phone]

## 2017-05-19 NOTE — Op Note (Signed)
05/18/2017  10:43 AM  PATIENT:  [name]  66 y.o. female  PRE-OPERATIVE DIAGNOSIS:  Lumbar epidural abscess/phlegmon  POST-OPERATIVE DIAGNOSIS:  Paraspinal and lumbar epidural abscess/phlegmon  PROCEDURE:  Lumbar wound exploration, removal of L4-5 pedicle screws and rods, L3-4 and L5-S1 laminectomy, re-operative bilateral L2-3 and L4-5 laminectomy, evacuation of epidural abscess and phlegmon  SURGEON:  Aldean Ast, MD  ASSISTANTS: Ferne Reus, PA-C  ANESTHESIA:   General  DRAINS: None   SPECIMEN:  Cultures, epidural phlegmon  INDICATION FOR PROCEDURE: 66 year old woman s/p lumbar decompression and L4-5 fusion about one and a half years ago.  She presented with worsening back pain, drainage from her incision, and findings concerning for epidural abscess and phlegmon.  I recommended the above procedure.  The patient understood the risks, benefits, and alternatives and potential outcomes and wished to proceed.  PROCEDURE DETAILS: After smooth induction of general endotracheal anesthesia the patient was positioned prone on a Wilson frame.  The lumbar area was prepped and draped in the usual sterile fashion.  The the lumbar scar was excised.  The soft tissues were dissected in the midline with monopolar cautery.  I encountered a pocket of necrotic tissue surrounding a prolene suture.  The spinous processes were identified and subperiosteal dissection was performed to expose the lateral edges of the lamina from L2-S1.  The L4-5 screws and rods were exposed in this process.  They were removed.  There was some phlegmon surrounding the right L4 and L5 screws.  This was collected and sent as a specimen for culture.  I resected the spinous processes from L2 to S1.  The only interlaminar space which could be identified was at L5-S1.  Using a curette I freed the inferior edge of the L5 lamina from the underlying ligament.  I thinned the lamina with a Leksell rongeur and drill and  removed it with Kerrison punches.  I elevated the ligamentum flavum and resected that.  There was purulent fluid under the ligament.  This was sampled.  I depressed the thecal sac and resected the superior edge of the S1 lamina.  I then used the high speed drill to thin the lamina of L4, L3, and inferior L2.  I had to thin the medial edge of the L2-3, L3-4, and L4-5 facets to allow adequate exposure of the lamina to be resected.  I then used the Leksell and Kerrison rongeurs to complete the laminectomies from inferior L2 to L5. I identified the phlegmon from L2 to L5 where it thinned where the L5-S1 interlaminar space had been.  There was scarring in the epidural space at L2-3 and L4-5.  I used curettes to gently separate the scar from the thecal sac.  I then widened the laminectomy and performed medial facetectomy at these levels.  After completion of the laminectomy there I turned my attention to the phlegmon.  I elevated the phlegmon and attempted to separate it from the thecal sac.  This was performed easily and L2-3 and L3-4 and L5.  It was densely adherent to the thecal sac at L4-5 on both sides where the laminotomies been performed previously.  I was able to separate it from the thecal sac, but in so doing there was unintentional durotomy without CSF leak.  The durotomies extended laterally and anteriorly on both sides and there was no was to repair the durotomies.  The wound was irrigated aggressively with bacitracin saline.  Duramatrix was placed over the durotomies and these were sealed  with adherus.  1g of Vancomycin powder was placed below the fascia.  The wound was then closed in multiple layers with interrupted vicryl sutures.  The skin was closed with running, locked nylon suture.  A sterile dressing was applied.  The patient was returned to the supine position on the stretcher and awoke without incident.  She will remain flat for 3 days.  PATIENT DISPOSITION:  PACU - hemodynamically stable.    Delay start of Pharmacological VTE agent (>24hrs) due to surgical blood loss or risk of bleeding:  yes

## 2017-05-19 NOTE — Progress Notes (Signed)
Call to on call MD regarding patient blood pressure status this afternoon.

## 2017-05-19 NOTE — Progress Notes (Signed)
Patient resting flat in bed with call light in reach.   Medicated for back pain with somewhat pain relief.  Patient continues with antibiotics.  Dressing to back remains clean, dry, intact.

## 2017-05-19 NOTE — Progress Notes (Signed)
Patient admitted to room 54M-09 from PACU.  Patient educated on flat bedrest order, patient advised understanding.  Patient orientated to room, call bell within reach.  Patient appeared to be resting.  Patient's cell phone given to patient's daughter by patient.  Patient's daughter advised that patients clothes and other personal belongings had been sent home earlier with patient's caregiver.  RN will continue to monitor patient

## 2017-05-19 NOTE — Care Management Note (Signed)
Case Management Note  Patient Details  Name: [name] MRN: [id number] Date of Birth: 03-Oct-1950  Subjective/Objective:   Pt admitted with spinal epidural abscess. She is from home.                    Action/Plan: Pt currently on bedrest. CM following for d/c needs, physician orders.   Expected Discharge Date:                  Expected Discharge Plan:     In-House Referral:     Discharge planning Services     Post Acute Care Choice:    Choice offered to:     DME Arranged:    DME Agency:     HH Arranged:    HH Agency:     Status of Service:  In process, will continue to follow  If discussed at Long Length of Stay Meetings, dates discussed:    Additional Comments:  Pollie Friar, RN 05/19/2017, 12:26 PM

## 2017-05-19 NOTE — Progress Notes (Signed)
NEUROSURGERY PROGRESS NOTE  Patient laying flat. C/O pain in groin area. Incision CDI.  Temp:  [97 F (36.1 C)-101.5 F (38.6 C)] 101.5 F (38.6 C) (10/01 0527) Pulse Rate:  [62-95] 95 (10/01 0527) Resp:  [13-20] 18 (10/01 0527) BP: (113-152)/(49-99) 114/85 (10/01 0527) SpO2:  [95 %-100 %] 98 % (10/01 0527) Weight:  [122.5 kg (270 lb)] 122.5 kg (270 lb) (09/30 1411)  Plan: Continue to lay flat. Continue pain management as needed.   Eleonore Chiquito, NP 05/19/2017 9:33 AM

## 2017-05-20 MED ORDER — WHITE PETROLATUM GEL
Status: AC
  Filled 2017-05-20: qty 1

## 2017-05-20 NOTE — Progress Notes (Signed)
Patient c/o "buttocks feeling numb" and muscle spasm on bil lower extremeties. RN suggesting slight log roll side to side to relieve pressure from buttock. From patient/family perspective , MD only wants pt to lie flat at this times. Muscle spasm meds administered.   Ave Filter, RN

## 2017-05-20 NOTE — Progress Notes (Signed)
Pt resting in bed quietly. Easily aroused. Noted with pain management concerns this shift. Pt educated that she will remain on strict bedrest with laying flat until 05/21/17 at 2000 due to CSF leak s/p surgery. Back incision is well approximated and moist with saturated honeycomb drsg that was changed. Noted 17 sutures that are intact. Area is well approximated. Pt c/o pain felt within her "vagina and I don't know why it hurts there." pain management administered per order and effective. Family at bedside earlier in the shift and they are very, very supportive and concerned about all medications that pt is taking and it correlation to her hypotension. Pt is very dramatic during any care provided due to her "spasms and her inability to maintain control of her arms due to the pain." pt is also very apologetic and expresses how she is unable to turn from side to side due to the pain experienced. Assist x 2 noted with much coaching of pt to reposition to assess back. scds for vte. Cath care provided. callbell within reach. Safety maintained. Will continue to monitor.

## 2017-05-20 NOTE — Progress Notes (Signed)
NEUROSURGERY PROGRESS NOTE  Doing well. Complains of appropriate back soreness. No numbness, tingling or weakness Good strength and sensation Incision CDI  Temp:  [98.4 F (36.9 C)-100.4 F (38 C)] 98.4 F (36.9 C) (10/02 0600) Pulse Rate:  [86-103] 103 (10/02 0600) Resp:  [17-20] 20 (10/02 0600) BP: (92-124)/(45-66) 115/61 (10/02 0600) SpO2:  [92 %-98 %] 98 % (10/02 0600)  Plan: Continue to lay flat and pain management  Eleonore Chiquito, NP 05/20/2017 7:17 AM

## 2017-05-20 NOTE — Progress Notes (Signed)
Patient noticed resting quietly in bed without any distress, pt declined pain medication at this time and will ask for it later. Family at bedside with update. Meds return to pyxis and staff will continue to round.   Ave Filter, RN

## 2017-05-21 DIAGNOSIS — Z889 Allergy status to unspecified drugs, medicaments and biological substances status: Secondary | ICD-10-CM

## 2017-05-21 DIAGNOSIS — A499 Bacterial infection, unspecified: Secondary | ICD-10-CM

## 2017-05-21 DIAGNOSIS — G061 Intraspinal abscess and granuloma: Secondary | ICD-10-CM

## 2017-05-21 DIAGNOSIS — G062 Extradural and subdural abscess, unspecified: Secondary | ICD-10-CM

## 2017-05-21 DIAGNOSIS — Z419 Encounter for procedure for purposes other than remedying health state, unspecified: Secondary | ICD-10-CM

## 2017-05-21 LAB — C-REACTIVE PROTEIN: CRP: 29.4 mg/dL — AB (ref <1.0)

## 2017-05-21 LAB — BASIC METABOLIC PANEL
ANION GAP: 6 (ref 5–15)
BUN: 8 mg/dL (ref 6–20)
CALCIUM: 7.7 mg/dL — AB (ref 8.9–10.3)
CO2: 28 mmol/L (ref 22–32)
Chloride: 103 mmol/L (ref 101–111)
Creatinine, Ser: 0.9 mg/dL (ref 0.44–1.00)
Glucose, Bld: 128 mg/dL — ABNORMAL HIGH (ref 65–99)
Potassium: 3.7 mmol/L (ref 3.5–5.1)
Sodium: 137 mmol/L (ref 135–145)

## 2017-05-21 LAB — CBC
HEMATOCRIT: 29.5 % — AB (ref 36.0–46.0)
Hemoglobin: 9.3 g/dL — ABNORMAL LOW (ref 12.0–15.0)
MCH: 27 pg (ref 26.0–34.0)
MCHC: 31.5 g/dL (ref 30.0–36.0)
MCV: 85.8 fL (ref 78.0–100.0)
PLATELETS: 151 10*3/uL (ref 150–400)
RBC: 3.44 MIL/uL — ABNORMAL LOW (ref 3.87–5.11)
RDW: 13.6 % (ref 11.5–15.5)
WBC: 9.6 10*3/uL (ref 4.0–10.5)

## 2017-05-21 LAB — SEDIMENTATION RATE: Sed Rate: 126 mm/hr — ABNORMAL HIGH (ref 0–22)

## 2017-05-21 LAB — AEROBIC CULTURE W GRAM STAIN (SUPERFICIAL SPECIMEN): Culture: NORMAL

## 2017-05-21 LAB — LACTATE DEHYDROGENASE: LDH: 151 U/L (ref 98–192)

## 2017-05-21 LAB — VANCOMYCIN, TROUGH: VANCOMYCIN TR: 13 ug/mL — AB (ref 15–20)

## 2017-05-21 MED ORDER — VANCOMYCIN HCL IN DEXTROSE 1-5 GM/200ML-% IV SOLN
1000.0000 mg | Freq: Two times a day (BID) | INTRAVENOUS | Status: DC
  Filled 2017-05-21 (×2): qty 200

## 2017-05-21 MED ORDER — VANCOMYCIN HCL IN DEXTROSE 750-5 MG/150ML-% IV SOLN
750.0000 mg | Freq: Two times a day (BID) | INTRAVENOUS | Status: DC
  Filled 2017-05-21: qty 150

## 2017-05-21 MED ORDER — DEXTROSE 5 % IV SOLN
2.0000 g | INTRAVENOUS | Status: DC
  Administered 2017-05-21 – 2017-05-24 (×4): 2 g via INTRAVENOUS
  Filled 2017-05-21 (×4): qty 2

## 2017-05-21 MED ORDER — DAPTOMYCIN 500 MG IV SOLR
8.0000 mg/kg | INTRAVENOUS | Status: DC
  Filled 2017-05-21 (×3): qty 19.6

## 2017-05-21 NOTE — Consult Note (Signed)
Date of Admission:  05/18/2017    Reason for Consult:  Recurrent epidural and paraspinal  abscess L2-S1  Sp I and D removal of L4-5 screws, rods, L3-4 and L-s1 laminectomy, bilateral L2-3 and L4-5 lamenectomy  Referring Provider: Sherley Bounds, MD.   Assessment: 1. Recurrent epidural and paraspinalabscess associate with hardware status post I&D evacuation of epidural and paraspinal abscesses removal of hardware, laminectomies  2. History of prior discitis and epidural abscess in T10-12 L2-L3 status post laminectomies decompression L4-L5 fusion with cultures having identified Proteus and Klebsiella, status post treatment with ceftriaxone intravenously as described in my notes and then also with protracted oral therapy with Ceftin    Plan: 1. Continue antibiotics but change Zosyn to ceftriaxone,  And change vancomycin to daptomicin  2. Hopefully cultures will identify an organism that we can target.  3. I would plan on giving her 8 weeks of systemic IV antibiotics.  Active Problems:   Spinal epidural abscess   Abscess in epidural space of lumbar spine   . bupivacaine liposome  20 mL Infiltration Once  . docusate sodium  100 mg Oral BID  . gabapentin  300 mg Oral TID  . oxyCODONE  10 mg Oral Q12H  . senna  1 tablet Oral BID  . sodium chloride flush  3 mL Intravenous Q12H    HPI: [name] is a 66 y.o. female whom I have know from prior admission and having folllowed her as an outpatient. She had admissio in March of 2017 and underwent T10-12, L2-3 laminectomies/decompression, L4-5 fusion on 10/25/2015. Irrigation and Debridement LUMBAR WOUNDon 11/02/2015. Cultures from surgery yielded Proteus and Klebsiella pneumonia. She was seen by Dr. Baxter Flattery and Dr. Linus Salmons and placed on IV ceftriaxone with plan for her to continue this through April 5th. She then failed to followup in our clinic initially and ended up on systemic IV vancomycin, then developed neutropenia. Imaging  done by [place] for [facility] neurosurgery and spine on June 29 had shown:residual dorsal epidural enhancing soft tissue that resulted in severe stenosis at L3 and L4 L4 and L5. I thien saw her and DC her PICC and placed her on augmentin which she took for several months but then had trouble with pruritis and stopped. This.   We switched her to ceftin but this was then DC and I saw her in December of 2017. Recheck her inflammatory markers at the time that were encouraging. She was supposed to follow-up with me again in 3 months time and apparently moved to [place] in the interim.  In the prior 2 months prior to admission she had recurrent worsening lower back pain with swelling at the prior surgical site. She was seen by Dr. Ronnald Ramp an MRI was ordered. She then developed large amount of drainage 24-48 hours prior to her admission and came to the emerge department.   MRI on the 30th showed:  Persistent extensive enhancing epidural soft tissue from L2-3 to the upper sacral canal, increased from the prior study at L5-S1 with resultant severe spinal stenosis. Given the patient's symptoms, this is concerning for recurrent infection with extensive epidural phlegmon and possible abscess extending posteriorly along the incision site.  She was admitted to Neurosurgery service and ultimately taken to the operating room with case beginning at 1600. She appears to have received vancomycin at 1520.   She underwent Lumbar wound exploration, removal of L4-5 pedicle screws and rods, L3-4 and L5-S1 laminectomy, re-operative bilateral  L2-3 and L4-5 laminectomy, evacuation of epidural abscess and phlegmon.  She has been changed to vancomycin and Zosyn. Intraoperative cultures so far are not growing much in the way of organisms today 3. There were some gram-positive rods seen on one wound culture but the remainder did not have any specific organisms seen on Gram stain. He has been on  vancomycin and Zosyn.    Review of Systems: ROS  Past Medical History:  Diagnosis Date  . Anxiety   . Arthritis    "just about qwhere" (03/17/2015)  . CHF (congestive heart failure) ([place])    [name].......LAST OFFICE VISIT WAS A FEW AGO  . CHF (congestive heart failure) ([place]) 03/06/2015  . Chronic bronchitis ([place])    "just about q yr; use nebulizer prn" (03/17/2015)  . Chronic lower back pain   . Complication of anesthesia    @ [place].....COULDN'T GET HER AWAKE....FOR  HERNIA SURGERY... PLACED ON VENTILATOR; woke up during cyst excision OR on my back"; 07/06/09 VHR: re-intubated in PACU due to hypoventilation, extubated POD#1  . Cyst of right kidney   . Discitis of lumbosacral region 03/06/2015  . 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 GERD (gastroesophageal reflux disease)    takes nexium  . Heart palpitations    takes Metoprolol  . Herpes simplex infection    LEFT  EYE----2 YR AGO  . History of pneumonia   . 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 Hypercholesteremia   . Hypertension    dr [name]  . IBS (irritable bowel syndrome) 03/06/2015  . Klebsiella infection 02/28/2016  . Proteus infection 02/28/2016  . Pruritic condition 04/15/2016  . Sleep difficulties    pt. states she had sleep eval > 10 yrs. ago in [place], no apnea found  . 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 denies  . Zoster 03/06/2015    Social History  Substance Use Topics  . Smoking status: Never Smoker  . Smokeless tobacco: Never Used  . Alcohol use Yes     Comment: "stopped drinking in the 1980's; never drank much"; very rarely    Family History  Problem Relation Age of Onset  . Hypertension Mother   . Hypertension Father   . Hypertension Brother   . Hypertension Daughter   . Hypertension Maternal Grandmother   . Hypertension Maternal Grandfather   . Hypertension Paternal Grandmother   . Hypertension Paternal Grandfather   . Anesthesia problems Neg Hx   . Heart attack Neg Hx   .  Stroke Neg Hx    Allergies  Allergen Reactions  . Aspirin Other (See Comments)    Stomach bleeding  . Ibuprofen Other (See Comments)    Stomach bleeding  . Mushroom Extract Complex Hives and Itching  . Shellfish Allergy Hives and Itching  . Sulfa Antibiotics Hives and Itching  . Ciprofloxacin Other (See Comments)    Made "bottom" raw  . Metronidazole Other (See Comments)    Made "bottom" raw  . Tylenol [Acetaminophen] Itching  . Augmentin [Amoxicillin-Pot Clavulanate] Itching    Has never had problems with amoxicillin/ penicillin  HAS TOLERATED ZOSYN WITH ZERO PROBLEMS  . Betadine [Povidone Iodine] Itching  . Coconut Flavor Itching  . Codeine Itching  . Eggs Or Egg-Derived Products Nausea And Vomiting  . Iodine Itching  . Ivp Dye [Iodinated Diagnostic Agents] Hives    Takes Benadryl 50mg  PO before receiving iodinated contrast     OBJECTIVE: Blood pressure (!) 164/78, pulse 94, temperature 98.8 F (37.1 C),  temperature source Oral, resp. rate 18, height 5\' 3"  (1.6 m), weight 270 lb (122.5 kg), SpO2 95 %.  Physical Exam  Lab Results Lab Results  Component Value Date   WBC 9.6 05/21/2017   HGB 9.3 (L) 05/21/2017   HCT 29.5 (L) 05/21/2017   MCV 85.8 05/21/2017   PLT 151 05/21/2017    Lab Results  Component Value Date   CREATININE 0.90 05/21/2017   BUN 8 05/21/2017   NA 137 05/21/2017   K 3.7 05/21/2017   CL 103 05/21/2017   CO2 28 05/21/2017    Lab Results  Component Value Date   ALT 9 (L) 05/18/2017   AST 18 05/18/2017   ALKPHOS 73 05/18/2017   BILITOT 0.5 05/18/2017     Microbiology: Recent Results (from the past 240 hour(s))  Wound or Superficial Culture     Status: None   Collection Time: 05/18/17  4:50 AM  Result Value Ref Range Status   Specimen Description BACK  Final   Special Requests NONE  Final   Gram Stain   Final    RARE WBC PRESENT,BOTH PMN AND MONONUCLEAR RARE GRAM POSITIVE RODS    Culture NORMAL SKIN FLORA  Final   Report Status  05/21/2017 FINAL  Final  Aerobic/Anaerobic Culture (surgical/deep wound)     Status: None (Preliminary result)   Collection Time: 05/18/17  3:18 PM  Result Value Ref Range Status   Specimen Description TISSUE  Final   Special Requests LUMBAR EPIDURAL PHLEGMON SPECIMEN C  Final   Gram Stain   Final    MODERATE WBC PRESENT, PREDOMINANTLY MONONUCLEAR NO ORGANISMS SEEN    Culture   Final    NO GROWTH 3 DAYS NO ANAEROBES ISOLATED; CULTURE IN PROGRESS FOR 5 DAYS   Report Status PENDING  Incomplete  Aerobic/Anaerobic Culture (surgical/deep wound)     Status: None (Preliminary result)   Collection Time: 05/18/17  3:22 PM  Result Value Ref Range Status   Specimen Description WOUND  Final   Special Requests LUMBAR EPIDURAL PHLEGMON SPECIMEN A  Final   Gram Stain   Final    FEW WBC PRESENT, PREDOMINANTLY MONONUCLEAR NO ORGANISMS SEEN    Culture   Final    NO GROWTH 3 DAYS NO ANAEROBES ISOLATED; CULTURE IN PROGRESS FOR 5 DAYS   Report Status PENDING  Incomplete  Aerobic/Anaerobic Culture (surgical/deep wound)     Status: None (Preliminary result)   Collection Time: 05/18/17  4:08 PM  Result Value Ref Range Status   Specimen Description ABSCESS LUMBAR  Final   Special Requests NONE  Final   Gram Stain   Final    FEW WBC PRESENT, PREDOMINANTLY MONONUCLEAR NO ORGANISMS SEEN    Culture   Final    NO GROWTH 3 DAYS NO ANAEROBES ISOLATED; CULTURE IN PROGRESS FOR 5 DAYS   Report Status PENDING  Incomplete  Aerobic/Anaerobic Culture (surgical/deep wound)     Status: None (Preliminary result)   Collection Time: 05/18/17  4:14 PM  Result Value Ref Range Status   Specimen Description TISSUE LUMBAR  Final   Special Requests NONE  Final   Gram Stain   Final    MODERATE WBC PRESENT, PREDOMINANTLY MONONUCLEAR NO ORGANISMS SEEN    Culture   Final    NO GROWTH 3 DAYS NO ANAEROBES ISOLATED; CULTURE IN PROGRESS FOR 5 DAYS   Report Status PENDING  Incomplete    Alcide Evener, [facility] [phone]  [phone] pager    05/21/2017, 6:35 PM

## 2017-05-21 NOTE — Progress Notes (Addendum)
ANTIBIOTIC CONSULT NOTE - INITIAL  Pharmacy Consult for Vancomcyin Indication: epidural abscess  Allergies  Allergen Reactions  . Aspirin Other (See Comments)    Stomach bleeding  . Ibuprofen Other (See Comments)    Stomach bleeding  . Mushroom Extract Complex Hives and Itching  . Shellfish Allergy Hives and Itching  . Sulfa Antibiotics Hives and Itching  . Ciprofloxacin Other (See Comments)    Made "bottom" raw  . Metronidazole Other (See Comments)    Made "bottom" raw  . Tylenol [Acetaminophen] Itching  . Augmentin [Amoxicillin-Pot Clavulanate] Itching    Has never had problems with amoxicillin/ penicillin  HAS TOLERATED ZOSYN WITH ZERO PROBLEMS  . Betadine [Povidone Iodine] Itching  . Coconut Flavor Itching  . Codeine Itching  . Eggs Or Egg-Derived Products Nausea And Vomiting  . Iodine Itching  . Ivp Dye [Iodinated Diagnostic Agents] Hives    Takes Benadryl 50mg  PO before receiving iodinated contrast    Patient Measurements: Height: 5\' 3"  (160 cm) Weight: 270 lb (122.5 kg) IBW/kg (Calculated) : 52.4 Adjusted Body Weight:    Vital Signs: Temp: 98.2 F (36.8 C) (10/03 0935) Temp Source: Oral (10/03 0935) BP: 137/59 (10/03 0935) Pulse Rate: 82 (10/03 0935) Intake/Output from previous day: 10/02 0701 - 10/03 0700 In: 1440 [P.O.:1040; IV Piggyback:400] Out: 1300 [Urine:1300] Intake/Output from this shift: Total I/O In: 240 [P.O.:240] Out: -   Labs:  Recent Labs  05/19/17 0438 05/21/17 1033  WBC 11.8* 9.6  HGB 10.3* 9.3*  PLT 167 151  CREATININE 1.18* 0.90   Estimated Creatinine Clearance: 75.9 mL/min (by C-G formula based on SCr of 0.9 mg/dL). No results for input(s): VANCOTROUGH, VANCOPEAK, VANCORANDOM, GENTTROUGH, GENTPEAK, GENTRANDOM, TOBRATROUGH, TOBRAPEAK, TOBRARND, AMIKACINPEAK, AMIKACINTROU, AMIKACIN in the last 72 hours.   Microbiology:  Medical History: Past Medical History:  Diagnosis Date  . Anxiety   . Arthritis    "just about  qwhere" (03/17/2015)  . CHF (congestive heart failure) ([place])    [name].......LAST OFFICE VISIT WAS A FEW AGO  . CHF (congestive heart failure) ([place]) 03/06/2015  . Chronic bronchitis ([place])    "just about q yr; use nebulizer prn" (03/17/2015)  . Chronic lower back pain   . Complication of anesthesia    @ [place].....COULDN'T GET HER AWAKE....FOR  HERNIA SURGERY... PLACED ON VENTILATOR; woke up during cyst excision OR on my back"; 07/06/09 VHR: re-intubated in PACU due to hypoventilation, extubated POD#1  . Cyst of right kidney   . Discitis of lumbosacral region 03/06/2015  . 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 GERD (gastroesophageal reflux disease)    takes nexium  . Heart palpitations    takes Metoprolol  . Herpes simplex infection    LEFT  EYE----2 YR AGO  . History of pneumonia   . 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 Hypercholesteremia   . Hypertension    dr [name]  . IBS (irritable bowel syndrome) 03/06/2015  . Klebsiella infection 02/28/2016  . Proteus infection 02/28/2016  . Pruritic condition 04/15/2016  . Sleep difficulties    pt. states she had sleep eval > 10 yrs. ago in [place], no apnea found  . 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 denies  . Zoster 03/06/2015   Assessment:  ID: abx for lumbar epidural abscess s/p removal of hardware 9/30 PM. 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 WBC 3.4, AF, Renal function remains stable, will order trough for later this evening.  Vancomycin 9/30>> Zosyn 9/30>>10/3 Ceftriaxone 10/3>>  9/30  wound>>  Goal of Therapy:  Eradication of infection  Plan:  Vancomycin 750mg  q12 hours Ceftriaxone 2g IV q24 hours Monitor renal fx, cx, vt prn  Georga Bora, PharmD Clinical Pharmacist 05/21/2017 1:13 PM

## 2017-05-21 NOTE — Progress Notes (Signed)
Patient ID: [name], female   DOB: February 06, 1951, 66 y.o.   MRN: [id number] Subjective: Patient reports some headache and pain in sacral region, no leg pain  Objective: Vital signs in last 24 hours: Temp:  [98.1 F (36.7 C)-99 F (37.2 C)] 98.2 F (36.8 C) (10/03 0935) Pulse Rate:  [82-103] 82 (10/03 0935) Resp:  [18-20] 20 (10/03 0935) BP: (116-150)/(59-79) 137/59 (10/03 0935) SpO2:  [96 %-100 %] 96 % (10/03 0935)  Intake/Output from previous day: 10/02 0701 - 10/03 0700 In: 1440 [P.O.:1040; IV Piggyback:400] Out: 1300 [Urine:1300] Intake/Output this shift: Total I/O In: 240 [P.O.:240] Out: -   Neurologic: Grossly normal, dressing dry and flat  Lab Results: Lab Results  Component Value Date   WBC 11.8 (H) 05/19/2017   HGB 10.3 (L) 05/19/2017   HCT 33.5 (L) 05/19/2017   MCV 87.7 05/19/2017   PLT 167 05/19/2017   Lab Results  Component Value Date   INR 1.16 05/19/2017   BMET Lab Results  Component Value Date   NA 137 05/19/2017   K 4.4 05/19/2017   CL 106 05/19/2017   CO2 25 05/19/2017   GLUCOSE 170 (H) 05/19/2017   BUN 7 05/19/2017   CREATININE 1.18 (H) 05/19/2017   CALCIUM 7.8 (L) 05/19/2017    Studies/Results: No results found.  Assessment/Plan: Continue flat BR and IV empiric abx, ID to see   LOS: 3 days    Jodi Criscuolo S 05/21/2017, 10:11 AM   '

## 2017-05-21 NOTE — Progress Notes (Signed)
Pharmacy Antibiotic Note  [name] is a 66 y.o. female admitted on 05/18/2017 with epidural abscess.  Pharmacy has been consulted for vancomycin dosing. 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 WBC has been trending downward. Afebrile in last 24 hours. Renal function is stable (SCr 0.9; CrCl ~76 mL/min). Vancomycin trough came back at 13 approximately 11 hours after last dose, slightly below goal range of 15-20.   Plan: Vancomycin 1000 IV every 12 hours.  Goal trough 15-20 mcg/mL.  Ceftriaxone 2g IV q24 hours Monitor renal fx, cx, vt prn  Height: 5\' 3"  (160 cm) Weight: 270 lb (122.5 kg) IBW/kg (Calculated) : 52.4  Temp (24hrs), Avg:98.2 F (36.8 C), Min:98.1 F (36.7 C), Max:98.4 F (36.9 C)   Recent Labs Lab 05/18/17 0047 05/18/17 0100 05/18/17 0417 05/19/17 0438 05/21/17 1033 05/21/17 1632  WBC 3.4*  --   --  11.8* 9.6  --   CREATININE 1.07*  --   --  1.18* 0.90  --   LATICACIDVEN  --  1.86 1.58  --   --   --   VANCOTROUGH  --   --   --   --   --  13*    Estimated Creatinine Clearance: 75.9 mL/min (by C-G formula based on SCr of 0.9 mg/dL).    Allergies  Allergen Reactions  . Aspirin Other (See Comments)    Stomach bleeding  . Ibuprofen Other (See Comments)    Stomach bleeding  . Mushroom Extract Complex Hives and Itching  . Shellfish Allergy Hives and Itching  . Sulfa Antibiotics Hives and Itching  . Ciprofloxacin Other (See Comments)    Made "bottom" raw  . Metronidazole Other (See Comments)    Made "bottom" raw  . Tylenol [Acetaminophen] Itching  . Augmentin [Amoxicillin-Pot Clavulanate] Itching    Has never had problems with amoxicillin/ penicillin  HAS TOLERATED ZOSYN WITH ZERO PROBLEMS  . Betadine [Povidone Iodine] Itching  . Coconut Flavor Itching  . Codeine Itching  . Eggs Or Egg-Derived Products Nausea And Vomiting  . Iodine Itching  . Ivp Dye [Iodinated Diagnostic  Agents] Hives    Takes Benadryl 50mg  PO before receiving iodinated contrast     Antimicrobials this admission: Zosyn 9/30>>10/3 Vancomycin 9/30>> Ceftriaxone 10/3>>  Dose adjustments this admission: 10/3 VT 13>> increase from 750 mg every 12 hours to 1000 mg every 12 hours  Microbiology results: 9/30 wound cx: normal skin flora 9/30 lumbar epidural specimen: NGTD  Thank you for allowing pharmacy to be a part of this patient's care.  Doylene Canard, PharmD Clinical Pharmacist  Phone: [phone] 05/21/2017 5:21 PM

## 2017-05-21 NOTE — Progress Notes (Signed)
[facility] Infusion Coordinator following pt  With ID team for home IV ABX upon DC if ordered.  If patient discharges after hours, please call [phone].   [name] 05/21/2017, 11:30 AM

## 2017-05-21 NOTE — Plan of Care (Signed)
Problem: Activity: Goal: Risk for activity intolerance will decrease Outcome: Progressing Pt remains on flat bedrest per MD orders, but pt able to logroll with no issues

## 2017-05-21 NOTE — Progress Notes (Signed)
Pharmacy Antibiotic Note  [name] is a 66 y.o. female admitted on 05/18/2017 with epidural abscess.  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 WBC has been trending downward. Afebrile in last 24 hours. Renal function is stable (SCr 0.9; CrCl ~76 mL/min). Switching from vancomycin to Daptomycin per ID   Plan: Start daptomycin 8 mg/kg (980 mg) Q 24 hours  Monitor baseline and weekly LDH   Height: 5\' 3"  (160 cm) Weight: 270 lb (122.5 kg) IBW/kg (Calculated) : 52.4  Temp (24hrs), Avg:98.3 F (36.8 C), Min:98.1 F (36.7 C), Max:98.8 F (37.1 C)   Recent Labs Lab 05/18/17 0047 05/18/17 0100 05/18/17 0417 05/19/17 0438 05/21/17 1033 05/21/17 1632  WBC 3.4*  --   --  11.8* 9.6  --   CREATININE 1.07*  --   --  1.18* 0.90  --   LATICACIDVEN  --  1.86 1.58  --   --   --   VANCOTROUGH  --   --   --   --   --  13*    Estimated Creatinine Clearance: 75.9 mL/min (by C-G formula based on SCr of 0.9 mg/dL).    Allergies  Allergen Reactions  . Vancomycin Other (See Comments)    Drug induced NEUTROPENIA  . Aspirin Other (See Comments)    Stomach bleeding  . Ibuprofen Other (See Comments)    Stomach bleeding  . Mushroom Extract Complex Hives and Itching  . Shellfish Allergy Hives and Itching  . Sulfa Antibiotics Hives and Itching  . Ciprofloxacin Other (See Comments)    Made "bottom" raw  . Metronidazole Other (See Comments)    Made "bottom" raw  . Tylenol [Acetaminophen] Itching  . Augmentin [Amoxicillin-Pot Clavulanate] Itching    Has never had problems with amoxicillin/ penicillin  HAS TOLERATED ZOSYN WITH ZERO PROBLEMS  . Betadine [Povidone Iodine] Itching  . Coconut Flavor Itching  . Codeine Itching  . Eggs Or Egg-Derived Products Nausea And Vomiting  . Iodine Itching  . Ivp Dye [Iodinated Diagnostic Agents] Hives    Takes Benadryl 50mg  PO before receiving iodinated contrast      Antimicrobials this admission: Zosyn 9/30>>10/3 Vancomycin 9/30>>10/3 Ceftriaxone 10/3>> Daptomycin 10/3>>   Dose adjustments this admission: 10/3 VT 13>> increase from 750 mg every 12 hours to 1000 mg every 12 hours  Microbiology results: 9/30 wound cx: normal skin flora 9/30 lumbar epidural specimen: NGTD  Thank you for allowing pharmacy to be a part of this patient's care.  Albertina Parr, PharmD., BCPS Clinical Pharmacist Pager [phone]

## 2017-05-22 DIAGNOSIS — R1310 Dysphagia, unspecified: Secondary | ICD-10-CM

## 2017-05-22 LAB — SEDIMENTATION RATE: Sed Rate: 120 mm/hr — ABNORMAL HIGH (ref 0–22)

## 2017-05-22 LAB — C-REACTIVE PROTEIN: CRP: 19.3 mg/dL — ABNORMAL HIGH (ref <1.0)

## 2017-05-22 MED ORDER — SODIUM CHLORIDE 0.9 % IV SOLN
1000.0000 mg | INTRAVENOUS | Status: DC
  Administered 2017-05-22 – 2017-05-23 (×2): 1000 mg via INTRAVENOUS
  Filled 2017-05-22 (×3): qty 20

## 2017-05-22 MED ORDER — PANTOPRAZOLE SODIUM 40 MG PO TBEC
40.0000 mg | DELAYED_RELEASE_TABLET | Freq: Every day | ORAL | Status: DC
  Administered 2017-05-22 – 2017-05-24 (×3): 40 mg via ORAL
  Filled 2017-05-22 (×3): qty 1

## 2017-05-22 NOTE — Plan of Care (Signed)
Problem: Activity: Goal: Risk for activity intolerance will decrease Outcome: Progressing Pt can now have head of bed 30 degrees with bathroom privileges.

## 2017-05-22 NOTE — Care Management Important Message (Signed)
Important Message  Patient Details  Name: [name] MRN: [id number] Date of Birth: 07-10-1951   Medicare Important Message Given:  Yes    Santa Abdelrahman Montine Circle 05/22/2017, 4:28 PM

## 2017-05-22 NOTE — Progress Notes (Signed)
PHARMACY CONSULT NOTE FOR:  OUTPATIENT  PARENTERAL ANTIBIOTIC THERAPY (OPAT)  Indication:Epidural Abscess  Regimen: Daptomycin 1000 mg every 24 hours + Ceftriaxone 2 gm every 24 hours  End date: November 26th , 2018  IV antibiotic discharge orders are pended. To discharging provider:  please sign these orders via discharge navigator,  Select New Orders & click on the button choice - Manage This Unsigned Work.     Thank you for allowing pharmacy to be a part of this patient's care.  Susa Raring, PharmD PGY2 Infectious Diseases Pharmacy Resident [phone]  05/22/2017, 12:13 PM

## 2017-05-22 NOTE — Progress Notes (Signed)
Pt called RN to room and stated that she needs to have a bowel movement. Pt was placed on bed pan and to bedside commode with no relief. Pt was given Fleets enema and prune juice in addition to stool softener and laxative given this morning. Pt had no relief. Pt started c/o pain and, at this point, manual disimpaction was performed. Small hard pebbles were extracted. Pt stated some relief. Iv pain medicine was given for pain control. Will continue to monitor.

## 2017-05-22 NOTE — Care Management Note (Signed)
Case Management Note  Patient Details  Name: JAZLINE CUMBEE MRN: [id number] Date of Birth: 03/28/1951  Subjective/Objective:                    Action/Plan: Per notes the plan is for patient to have 8 weeks of IV antibiotics. CM met with the patient to provide her a list of [facility] agencies. She has used [facility] in the past and would like to use them again. Jermaine with [facility] notified and accepted the referral.  Pt states she has an aide that assists her at home and a roommate that can assist with her care at home.  CM continuing to follow.  Expected Discharge Date:                  Expected Discharge Plan:  [place]  In-House Referral:     Discharge planning Services  CM Consult  Post Acute Care Choice:  Home Health Choice offered to:  Patient  DME Arranged:    DME Agency:     HH Arranged:  RN [place] Agency:  [place]  Status of Service:  In process, will continue to follow  If discussed at Long Length of Stay Meetings, dates discussed:    Additional Comments:  Pollie Friar, RN 05/22/2017, 11:19 AM

## 2017-05-22 NOTE — Progress Notes (Signed)
Subjective: Patient is complaining of some pills getting stuck in her throat   Antibiotics:  Anti-infectives    Start     Dose/Rate Route Frequency Ordered Stop   05/22/17 2000  DAPTOmycin (CUBICIN) 1,000 mg in sodium chloride 0.9 % IVPB     1,000 mg 240 mL/hr over 30 Minutes Intravenous Every 24 hours 05/22/17 1305     05/21/17 2000  DAPTOmycin (CUBICIN) 980 mg in sodium chloride 0.9 % IVPB  Status:  Discontinued     8 mg/kg  122.5 kg 239.2 mL/hr over 30 Minutes Intravenous Every 24 hours 05/21/17 1858 05/22/17 1305   05/21/17 1800  vancomycin (VANCOCIN) IVPB 1000 mg/200 mL premix  Status:  Discontinued     1,000 mg 200 mL/hr over 60 Minutes Intravenous Every 12 hours 05/21/17 1724 05/21/17 1854   05/21/17 1700  vancomycin (VANCOCIN) IVPB 750 mg/150 ml premix  Status:  Discontinued     750 mg 150 mL/hr over 60 Minutes Intravenous Every 12 hours 05/21/17 1323 05/21/17 1724   05/21/17 1200  cefTRIAXone (ROCEPHIN) 2 g in dextrose 5 % 50 mL IVPB     2 g 100 mL/hr over 30 Minutes Intravenous Every 24 hours 05/21/17 1008     05/19/17 0300  vancomycin (VANCOCIN) IVPB 750 mg/150 ml premix  Status:  Discontinued     750 mg 150 mL/hr over 60 Minutes Intravenous Every 12 hours 05/18/17 1926 05/21/17 1323   05/18/17 2200  piperacillin-tazobactam (ZOSYN) IVPB 3.375 g  Status:  Discontinued     3.375 g 12.5 mL/hr over 240 Minutes Intravenous Every 8 hours 05/18/17 2103 05/21/17 1008   05/18/17 1643  vancomycin (VANCOCIN) powder  Status:  Discontinued       As needed 05/18/17 1653 05/18/17 1733   05/18/17 1505  bacitracin 50,000 Units in sodium chloride irrigation 0.9 % 500 mL irrigation  Status:  Discontinued       As needed 05/18/17 1555 05/18/17 1733   05/18/17 0945  vancomycin (VANCOCIN) 1,500 mg in sodium chloride 0.9 % 500 mL IVPB     1,500 mg 250 mL/hr over 120 Minutes Intravenous 120 min pre-op 05/18/17 0945 05/18/17 1520      Medications: Scheduled Meds: .  bupivacaine liposome  20 mL Infiltration Once  . docusate sodium  100 mg Oral BID  . gabapentin  300 mg Oral TID  . oxyCODONE  10 mg Oral Q12H  . senna  1 tablet Oral BID  . sodium chloride flush  3 mL Intravenous Q12H   Continuous Infusions: . sodium chloride 10 mL/hr at 05/18/17 2135  . sodium chloride    . cefTRIAXone (ROCEPHIN)  IV 2 g (05/22/17 1151)  . DAPTOmycin (CUBICIN)  IV    . lactated ringers 10 mL/hr at 05/18/17 1412  . methocarbamol (ROBAXIN)  IV 500 mg (05/18/17 1933)   PRN Meds:.acetaminophen, bisacodyl, HYDROmorphone (DILAUDID) injection, menthol-cetylpyridinium **OR** phenol, methocarbamol **OR** methocarbamol (ROBAXIN)  IV, ondansetron **OR** ondansetron (ZOFRAN) IV, oxyCODONE, senna-docusate, sodium chloride flush, sodium phosphate    Objective: Weight change:   Intake/Output Summary (Last 24 hours) at 05/22/17 1314 Last data filed at 05/22/17 1100  Gross per 24 hour  Intake              730 ml  Output             1150 ml  Net             -420 ml   Blood  pressure (!) 155/80, pulse (!) 113, temperature 98.4 F (36.9 C), temperature source Oral, resp. rate 18, height 5\' 3"  (1.6 m), weight 270 lb (122.5 kg), SpO2 97 %. Temp:  [97.8 F (36.6 C)-99 F (37.2 C)] 98.4 F (36.9 C) (10/04 0929) Pulse Rate:  [88-117] 113 (10/04 1100) Resp:  [18] 18 (10/04 0929) BP: (141-165)/(52-109) 155/80 (10/04 1100) SpO2:  [92 %-97 %] 97 % (10/04 0929)  Physical Exam: General: Alert and awake, oriented x3, not in any acute distress, complaining of some mid chest discomfort which she attributes the pills not passing through HEENT: anicteric sclera, pupils reactive to light and accommodation, EOMI CVS regular rate, normal r,   Chest: cno wheezing,no respiratory distress Abdomen: , nondistended, normal bowel sounds, Extremities: no  clubbing or edema noted bilaterally Skin: no rashes Operative wound not examined Neuro: nonfocal  CBC:  CBC Latest Ref Rng & Units  05/21/2017 05/19/2017 05/18/2017  WBC 4.0 - 10.5 K/uL 9.6 11.8(H) 3.4(L)  Hemoglobin 12.0 - 15.0 g/dL 9.3(L) 10.3(L) 12.1  Hematocrit 36.0 - 46.0 % 29.5(L) 33.5(L) 39.1  Platelets 150 - 400 K/uL 151 167 166     BMET  Recent Labs  05/21/17 1033  NA 137  K 3.7  CL 103  CO2 28  GLUCOSE 128*  BUN 8  CREATININE 0.90  CALCIUM 7.7*     Liver Panel  No results for input(s): PROT, ALBUMIN, AST, ALT, ALKPHOS, BILITOT, BILIDIR, IBILI in the last 72 hours.     Sedimentation Rate  Recent Labs  05/22/17 0635  ESRSEDRATE 120*   C-Reactive Protein  Recent Labs  05/21/17 1033 05/22/17 0635  CRP 29.4* 19.3*    Micro Results: Recent Results (from the past 720 hour(s))  Wound or Superficial Culture     Status: None   Collection Time: 05/18/17  4:50 AM  Result Value Ref Range Status   Specimen Description BACK  Final   Special Requests NONE  Final   Gram Stain   Final    RARE WBC PRESENT,BOTH PMN AND MONONUCLEAR RARE GRAM POSITIVE RODS    Culture NORMAL SKIN FLORA  Final   Report Status 05/21/2017 FINAL  Final  Aerobic/Anaerobic Culture (surgical/deep wound)     Status: None (Preliminary result)   Collection Time: 05/18/17  3:18 PM  Result Value Ref Range Status   Specimen Description TISSUE  Final   Special Requests LUMBAR EPIDURAL PHLEGMON SPECIMEN C  Final   Gram Stain   Final    MODERATE WBC PRESENT, PREDOMINANTLY MONONUCLEAR NO ORGANISMS SEEN    Culture   Final    NO GROWTH 3 DAYS NO ANAEROBES ISOLATED; CULTURE IN PROGRESS FOR 5 DAYS   Report Status PENDING  Incomplete  Aerobic/Anaerobic Culture (surgical/deep wound)     Status: None (Preliminary result)   Collection Time: 05/18/17  3:22 PM  Result Value Ref Range Status   Specimen Description WOUND  Final   Special Requests LUMBAR EPIDURAL PHLEGMON SPECIMEN A  Final   Gram Stain   Final    FEW WBC PRESENT, PREDOMINANTLY MONONUCLEAR NO ORGANISMS SEEN    Culture   Final    NO GROWTH 3 DAYS NO ANAEROBES  ISOLATED; CULTURE IN PROGRESS FOR 5 DAYS   Report Status PENDING  Incomplete  Aerobic/Anaerobic Culture (surgical/deep wound)     Status: None (Preliminary result)   Collection Time: 05/18/17  4:08 PM  Result Value Ref Range Status   Specimen Description ABSCESS LUMBAR  Final   Special Requests NONE  Final   Gram Stain   Final    FEW WBC PRESENT, PREDOMINANTLY MONONUCLEAR NO ORGANISMS SEEN    Culture   Final    NO GROWTH 3 DAYS NO ANAEROBES ISOLATED; CULTURE IN PROGRESS FOR 5 DAYS   Report Status PENDING  Incomplete  Aerobic/Anaerobic Culture (surgical/deep wound)     Status: None (Preliminary result)   Collection Time: 05/18/17  4:14 PM  Result Value Ref Range Status   Specimen Description TISSUE LUMBAR  Final   Special Requests NONE  Final   Gram Stain   Final    MODERATE WBC PRESENT, PREDOMINANTLY MONONUCLEAR NO ORGANISMS SEEN    Culture   Final    NO GROWTH 3 DAYS NO ANAEROBES ISOLATED; CULTURE IN PROGRESS FOR 5 DAYS   Report Status PENDING  Incomplete    Studies/Results: No results found.    Assessment/Plan:  INTERVAL HISTORY: cultures no growth  Active Problems:   Epidural abscess   Abscess in epidural space of lumbar spine   Surgery, elective   Multiple allergies   Gram-negative infection    Rim A Nagorski is a 66 y.o. female with  Recurrent epidural and also paraspinal abscess sp evacuation of epidural and paraspinal abscesses removal of hardware, laminectomies. Cultures have been completely unrevealing. They are only being held for anerobes which are unlikely to grow  Given her hx of neutropenia with vancomycin before we are using daptomycin and will pair this with Ceftriaxone  We'll continue to follow her cultures but the tentative plan of antibiotics will be as follows.  Diagnosis: Epidural and paraspinal abscess  Culture Result: no growth.  Allergies  Allergen Reactions  . Vancomycin Other (See Comments)    Drug induced NEUTROPENIA  . Aspirin  Other (See Comments)    Stomach bleeding  . Ibuprofen Other (See Comments)    Stomach bleeding  . Mushroom Extract Complex Hives and Itching  . Shellfish Allergy Hives and Itching  . Sulfa Antibiotics Hives and Itching  . Ciprofloxacin Other (See Comments)    Made "bottom" raw  . Metronidazole Other (See Comments)    Made "bottom" raw  . Tylenol [Acetaminophen] Itching  . Augmentin [Amoxicillin-Pot Clavulanate] Itching    Has never had problems with amoxicillin/ penicillin  HAS TOLERATED ZOSYN WITH ZERO PROBLEMS  . Betadine [Povidone Iodine] Itching  . Coconut Flavor Itching  . Codeine Itching  . Eggs Or Egg-Derived Products Nausea And Vomiting  . Iodine Itching  . Ivp Dye [Iodinated Diagnostic Agents] Hives    Takes Benadryl 50mg  PO before receiving iodinated contrast     OPAT Orders Discharge antibiotics: IV Daptomycinper pharmacy and Ceftriaxone 2 grams daily Per pharmacy protocol Daptomycin,  and Ceftriaxone 2 grams daily   Duration: 6 weeks End Date: November 26th , 2018  [place] Per Protocol:  Labs weekly while on IV antibiotics: _x_ CBC with differential _x_ BMP x__CPK _x_ CRP _x_ESR   _x_ Please pull PIC at completion of IV antibiotics __ Please leave PIC in place until doctor has seen patient or been notified  Fax weekly labs to [phone]  Clinic Follow Up Appt:  Next 4 weeks  #2 Dysphagia: If this persists may need to either consider an empiric trial of fluconazole for possible candidal esophagiits (she DOES nto have evidence in OP for this) or may need to ask GI to see her and consider EGD    LOS: 4 days   Rhina Brackett Dam 05/22/2017, 1:14 PM

## 2017-05-22 NOTE — Progress Notes (Signed)
Patient ID: [name], female   DOB: 09/23/1950, 66 y.o.   MRN: [id number] Subjective: Patient reports abd upset, +flatus, back pain, mild headache  Objective: Vital signs in last 24 hours: Temp:  [97.8 F (36.6 C)-99 F (37.2 C)] 98.4 F (36.9 C) (10/04 0929) Pulse Rate:  [88-117] 113 (10/04 1100) Resp:  [18] 18 (10/04 0929) BP: (141-165)/(52-109) 155/80 (10/04 1100) SpO2:  [92 %-97 %] 97 % (10/04 0929)  Intake/Output from previous day: 10/03 0701 - 10/04 0700 In: 1 [P.O.:560; IV Piggyback:50] Out: 1150 [Urine:1150] Intake/Output this shift: Total I/O In: 360 [P.O.:360] Out: -   Neurologic: Grossly normal, sanguinous drainage from wound otherwise looks good  Lab Results: Lab Results  Component Value Date   WBC 9.6 05/21/2017   HGB 9.3 (L) 05/21/2017   HCT 29.5 (L) 05/21/2017   MCV 85.8 05/21/2017   PLT 151 05/21/2017   Lab Results  Component Value Date   INR 1.16 05/19/2017   BMET Lab Results  Component Value Date   NA 137 05/21/2017   K 3.7 05/21/2017   CL 103 05/21/2017   CO2 28 05/21/2017   GLUCOSE 128 (H) 05/21/2017   BUN 8 05/21/2017   CREATININE 0.90 05/21/2017   CALCIUM 7.7 (L) 05/21/2017    Studies/Results: No results found.  Assessment/Plan: Mobilize today, remove foley, cont ABX   LOS: 4 days    Fredrik Mogel S 05/22/2017, 12:07 PM

## 2017-05-23 LAB — AEROBIC/ANAEROBIC CULTURE (SURGICAL/DEEP WOUND)
CULTURE: NO GROWTH
CULTURE: NO GROWTH

## 2017-05-23 LAB — AEROBIC/ANAEROBIC CULTURE W GRAM STAIN (SURGICAL/DEEP WOUND)
Culture: NO GROWTH
Culture: NO GROWTH

## 2017-05-23 LAB — CK: CK TOTAL: 82 U/L (ref 38–234)

## 2017-05-23 MED ORDER — SODIUM CHLORIDE 0.9% FLUSH
10.0000 mL | INTRAVENOUS | Status: DC | PRN
Start: 2017-05-23 — End: 2017-05-24
  Administered 2017-05-23 – 2017-05-24 (×3): 10 mL
  Filled 2017-05-23 (×3): qty 40

## 2017-05-23 NOTE — Progress Notes (Signed)
Pharmacy Antibiotic Note  [name] is a 66 y.o. female admitted on 05/18/2017 with epidural abscess.  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  Patient remains afebrile, WBC normal. CK today within normal limits at 82.  Plan: Daptomycin 1000mg  IV  q24h Ceftriaxone 2g IV q24h Monitor weekly CK (next due 10/12) Follow renal function, any changes to above plan- noted that OPAT orders have been placed. See note from E. Vernona Rieger, RPh from 10/4 for more detail  Height: 5\' 3"  (160 cm) Weight: 270 lb (122.5 kg) IBW/kg (Calculated) : 52.4  Temp (24hrs), Avg:98.8 F (37.1 C), Min:98.4 F (36.9 C), Max:99.1 F (37.3 C)   Recent Labs Lab 05/18/17 0047 05/18/17 0100 05/18/17 0417 05/19/17 0438 05/21/17 1033 05/21/17 1632  WBC 3.4*  --   --  11.8* 9.6  --   CREATININE 1.07*  --   --  1.18* 0.90  --   LATICACIDVEN  --  1.86 1.58  --   --   --   VANCOTROUGH  --   --   --   --   --  13*    Estimated Creatinine Clearance: 75.9 mL/min (by C-G formula based on SCr of 0.9 mg/dL).    Allergies  Allergen Reactions  . Vancomycin Other (See Comments)    Drug induced NEUTROPENIA  . Aspirin Other (See Comments)    Stomach bleeding  . Ibuprofen Other (See Comments)    Stomach bleeding  . Mushroom Extract Complex Hives and Itching  . Shellfish Allergy Hives and Itching  . Sulfa Antibiotics Hives and Itching  . Ciprofloxacin Other (See Comments)    Made "bottom" raw  . Metronidazole Other (See Comments)    Made "bottom" raw  . Tylenol [Acetaminophen] Itching  . Augmentin [Amoxicillin-Pot Clavulanate] Itching    Has never had problems with amoxicillin/ penicillin  HAS TOLERATED ZOSYN WITH ZERO PROBLEMS  . Betadine [Povidone Iodine] Itching  . Coconut Flavor Itching  . Codeine Itching  . Eggs Or Egg-Derived Products Nausea And Vomiting  . Iodine Itching  . Ivp Dye [Iodinated Diagnostic Agents] Hives   Takes Benadryl 50mg  PO before receiving iodinated contrast     Vancomycin 9/30>>10/3 Zosyn 9/30>>10/3 CFTX 10/3>> Dapto 10/3>>  10/5 CK = 82  9/30 multiple wound/abscess: ngtd, only being held for anaerobes  Thank you for allowing pharmacy to be a part of this patient's care.  Jadence Kinlaw D. Zinia Innocent, PharmD, [place] Clinical Pharmacist Pager: [phone] Clinical Phone for 05/23/2017 until 3:30pm: [phone] If after 3:30pm, please call main pharmacy at [phone] 05/23/2017 10:21 AM

## 2017-05-23 NOTE — Progress Notes (Signed)
Per insurance, 350mg  Daptomycin is not formulary but 500mg  is. She will need to be discharged home on 500mg  Daptomycin IV Q24 hours for 8 days per ID.

## 2017-05-23 NOTE — Progress Notes (Signed)
Peripherally Inserted Central Catheter/Midline Placement  The IV Nurse has discussed with the patient and/or persons authorized to consent for the patient, the purpose of this procedure and the potential benefits and risks involved with this procedure.  The benefits include less needle sticks, lab draws from the catheter, and the patient may be discharged home with the catheter. Risks include, but not limited to, infection, bleeding, blood clot (thrombus formation), and puncture of an artery; nerve damage and irregular heartbeat and possibility to perform a PICC exchange if needed/ordered by physician.  Alternatives to this procedure were also discussed.  Bard Power PICC patient education guide, fact sheet on infection prevention and patient information card has been provided to patient /or left at bedside.    PICC/Midline Placement Documentation        Synthia Innocent 05/23/2017, 11:13 AM

## 2017-05-23 NOTE — Progress Notes (Signed)
[facility]  [facility] is prepared for weekend DC for pt. [facility] has obtained Prior Auth for patients Daptomycin with a $0.52 /day copay.  Rocephin even less. Teaching with pt today regarding self administration of IV Push IV ABX. Pt has administered both in the recent past.  [facility] will plan to see at hom the next day after DC. Pt will receive doses of IV ABX at hospital day of DC. [facility] will need OPAT consult/script to provide IV ABX at home at DC.  If patient discharges after hours, please call [phone].   Larry Sierras 05/23/2017, 5:19 PM

## 2017-05-24 LAB — BASIC METABOLIC PANEL
Anion gap: 7 (ref 5–15)
CALCIUM: 8 mg/dL — AB (ref 8.9–10.3)
CHLORIDE: 103 mmol/L (ref 101–111)
CO2: 29 mmol/L (ref 22–32)
CREATININE: 0.8 mg/dL (ref 0.44–1.00)
GFR calc Af Amer: >60 mL/min (ref >60)
Glucose, Bld: 126 mg/dL — ABNORMAL HIGH (ref 65–99)
Potassium: 2.9 mmol/L — ABNORMAL LOW (ref 3.5–5.1)
SODIUM: 139 mmol/L (ref 135–145)

## 2017-05-24 LAB — CBC
HCT: 28.4 % — ABNORMAL LOW (ref 36.0–46.0)
Hemoglobin: 8.9 g/dL — ABNORMAL LOW (ref 12.0–15.0)
MCH: 26.6 pg (ref 26.0–34.0)
MCHC: 31.3 g/dL (ref 30.0–36.0)
MCV: 85 fL (ref 78.0–100.0)
PLATELETS: 176 10*3/uL (ref 150–400)
RBC: 3.34 MIL/uL — ABNORMAL LOW (ref 3.87–5.11)
RDW: 13.8 % (ref 11.5–15.5)
WBC: 6.5 10*3/uL (ref 4.0–10.5)

## 2017-05-24 MED ORDER — OXYCODONE HCL 5 MG PO TABS: 5.0000 mg | ORAL_TABLET | ORAL | 0 refills | Status: DC | PRN

## 2017-05-24 MED ORDER — DAPTOMYCIN IV (FOR PTA / DISCHARGE USE ONLY): 1000.0000 mg | INTRAVENOUS | 0 refills | Status: AC

## 2017-05-24 MED ORDER — HEPARIN SOD (PORK) LOCK FLUSH 100 UNIT/ML IV SOLN
250.0000 [IU] | INTRAVENOUS | Status: AC | PRN
  Administered 2017-05-24: 250 [IU]

## 2017-05-24 MED ORDER — OXYCODONE HCL ER 10 MG PO T12A: 10.0000 mg | EXTENDED_RELEASE_TABLET | Freq: Two times a day (BID) | ORAL | 0 refills | Status: DC

## 2017-05-24 MED ORDER — CEFTRIAXONE IV (FOR PTA / DISCHARGE USE ONLY): 2.0000 g | INTRAVENOUS | 0 refills | Status: DC

## 2017-05-24 NOTE — Progress Notes (Signed)
Patient ID: [name], female   DOB: 06/26/1951, 66 y.o.   MRN: [id number]  Patient doing well feels that she can take care of herself at home. Awake alert neurologically nonfocal improved back pain  Will prep for discharge antibiotics are arranged.

## 2017-05-24 NOTE — Discharge Summary (Addendum)
Physician Discharge Summary  Patient ID: [name] MRN: [id number] DOB/AGE: 1951/06/22 66 y.o.  Admit date: 05/18/2017 Discharge date: 05/24/2017  Admission Diagnoses:postoperative wound infection lumbar epidural abscess  Discharge Diagnoses: same Active Problems:   Epidural abscess   Abscess in epidural space of lumbar spine   Surgery, elective   Multiple allergies   Gram-negative infection   Pill dysphagia   Discharged Condition: good  Hospital Course: patient is admitted with increased back pain wound dehiscence and drainageto the emergency room. Patient was taken to the operating room on hospital day 1 and underwent I and D of lumbar wound and extension of her laminotomy for removal of epidural abscess. postoperatively patient placed was placed on IV antibiotics was seen by infectious disease and had a PICC line placed. Patient's pain got significantly improved her wound improved she was arranged for home health antibiotics and patient will be discharged with home health and home health antibiotics.  Consults:infectious disease Significant Diagnostic Studies: Treatments:I and D of lumbar wound extension of er laminectomy for removal of epidural abscess Discharge Exam: Blood pressure (!) 141/81, pulse 100, temperature 98.4 F (36.9 C), temperature source Oral, resp. rate 20, height 5\' 3"  (1.6 m), weight 122.5 kg (270 lb), SpO2 98 %. Wake alert strength 5 o of 5  Disposition: home  Discharge Instructions    Home infusion instructions [facility] May follow [facility] Dosing Protocol; May administer Cathflo as needed to maintain patency of vascular access device.; Flushing of vascular access device: per [facility] Protocol: 0.9% NaCl pre/post medica...    Complete by:  As directed    Instructions:  May follow [facility] Dosing Protocol   Instructions:  May administer Cathflo as needed to maintain patency of vascular access device.   Instructions:  Flushing of  vascular access device: per [facility] Protocol: 0.9% NaCl pre/post medication administration and prn patency; Heparin 100 u/ml, 5ml for implanted ports and Heparin 10u/ml, 5ml for all other central venous catheters.   Instructions:  May follow [facility] Anaphylaxis Protocol for First Dose Administration in the home: 0.9% NaCl at 25-50 ml/hr to maintain IV access for protocol meds. Epinephrine 0.3 ml IV/IM PRN and Benadryl 25-50 IV/IM PRN s/s of anaphylaxis.   Instructions:  [facility] Infusion Coordinator (RN) to assist per patient IV care needs in the home PRN.     Allergies as of 05/24/2017      Reactions   Vancomycin Other (See Comments)   Drug induced NEUTROPENIA   Aspirin Other (See Comments)   Stomach bleeding   Ibuprofen Other (See Comments)   Stomach bleeding   Mushroom Extract Complex Hives, Itching   Shellfish Allergy Hives, Itching   Sulfa Antibiotics Hives, Itching   Ciprofloxacin Other (See Comments)   Made "bottom" raw   Metronidazole Other (See Comments)   Made "bottom" raw   Tylenol [acetaminophen] Itching   Augmentin [amoxicillin-pot Clavulanate] Itching   Has never had problems with amoxicillin/ penicillin HAS TOLERATED ZOSYN WITH ZERO PROBLEMS   Betadine [povidone Iodine] Itching   Coconut Flavor Itching   Codeine Itching   Eggs Or Egg-derived Products Nausea And Vomiting   Iodine Itching   Ivp Dye [iodinated Diagnostic Agents] Hives   Takes Benadryl 50mg  PO before receiving iodinated contrast      Medication List    TAKE these medications   VENTOLIN HFA 108 (90 Base) MCG/ACT inhaler Generic drug:  albuterol Inhale 2 puffs into the lungs every 4 (four) hours as needed for wheezing  or shortness of breath.   albuterol (2.5 MG/3ML) 0.083% nebulizer solution Commonly known as:  PROVENTIL Take 2.5 mg by nebulization every 6 (six) hours as needed for wheezing.   buPROPion 100 MG 12 hr tablet Commonly known as:  WELLBUTRIN SR Take 100 mg by mouth daily.    cefTRIAXone IVPB Commonly known as:  ROCEPHIN Inject 2 g into the vein daily. Indication: Epidural Abscess Last Day of Therapy: 07/14/2017 Labs - Once weekly:  CBC/D and BMP, Labs - Every other week:  ESR and CRP   citalopram 40 MG tablet Commonly known as:  CELEXA Take 40 mg by mouth daily.   cloNIDine 0.3 mg/24hr patch Commonly known as:  CATAPRES - Dosed in mg/24 hr Place 0.3 mg onto the skin every Thursday.   daptomycin IVPB Commonly known as:  CUBICIN Inject 1,000 mg into the vein daily. Indication:  Epidural abscess Last Day of Therapy:  07/14/17 Labs - Once weekly:  CBC/D, BMP, and CPK Labs - Every other week:  ESR and CRP   furosemide 80 MG tablet Commonly known as:  LASIX Take 80 mg by mouth daily.   losartan-hydrochlorothiazide 100-12.5 MG tablet Commonly known as:  HYZAAR Take 1 tablet by mouth daily.   metoprolol tartrate 25 MG tablet Commonly known as:  LOPRESSOR Take 25 mg by mouth daily.   metroNIDAZOLE 500 MG tablet Commonly known as:  FLAGYL Take 1 tablet (500 mg total) by mouth 3 (three) times daily.   NEXIUM 40 MG capsule Generic drug:  esomeprazole Take 40 mg by mouth daily.   ondansetron 4 MG tablet Commonly known as:  ZOFRAN Take 1 tablet (4 mg total) by mouth every 6 (six) hours as needed for nausea or vomiting.   oxyCODONE 5 MG immediate release tablet Commonly known as:  Oxy IR/ROXICODONE Take 1-2 tablets (5-10 mg total) by mouth every 3 (three) hours as needed for breakthrough pain.   oxyCODONE 10 mg 12 hr tablet Commonly known as:  OXYCONTIN Take 1 tablet (10 mg total) by mouth every 12 (twelve) hours.   oxyCODONE-acetaminophen 5-325 MG tablet Commonly known as:  PERCOCET/ROXICET Take 1-2 tablets by mouth every 6 (six) hours as needed for severe pain.   potassium chloride SA 20 MEQ tablet Commonly known as:  K-DUR,KLOR-CON Take 20 mEq by mouth 2 (two) times daily.   simvastatin 20 MG tablet Commonly known as:  ZOCOR Take 20 mg by  mouth every evening.            Home Infusion Instuctions        Start     Ordered   05/24/17 0000  Home infusion instructions [facility] May follow [facility] Dosing Protocol; May administer Cathflo as needed to maintain patency of vascular access device.; Flushing of vascular access device: per [facility] Protocol: 0.9% NaCl pre/post medica...    Question Answer Comment  Instructions May follow [facility] Dosing Protocol   Instructions May administer Cathflo as needed to maintain patency of vascular access device.   Instructions Flushing of vascular access device: per [facility] Protocol: 0.9% NaCl pre/post medication administration and prn patency; Heparin 100 u/ml, 5ml for implanted ports and Heparin 10u/ml, 5ml for all other central venous catheters.   Instructions May follow [facility] Anaphylaxis Protocol for First Dose Administration in the home: 0.9% NaCl at 25-50 ml/hr to maintain IV access for protocol meds. Epinephrine 0.3 ml IV/IM PRN and Benadryl 25-50 IV/IM PRN s/s of anaphylaxis.   Instructions [facility] Infusion Coordinator (  RN) to assist per patient IV care needs in the home PRN.      05/24/17 0657       Signed: CRAM,GARY P 05/24/2017, 6:59 AM

## 2017-05-25 ENCOUNTER — Encounter (HOSPITAL_COMMUNITY): Payer: Self-pay

## 2017-05-25 ENCOUNTER — Emergency Department (HOSPITAL_COMMUNITY)
Admission: EM | Admit: 2017-05-25 | Discharge: 2017-05-25 | Disposition: A | Discharge status: Home / Self Care | Payer: Medicare Other | Attending: Emergency Medicine | Admitting: Emergency Medicine

## 2017-05-25 DIAGNOSIS — Z79899 Other long term (current) drug therapy: Secondary | ICD-10-CM | POA: Insufficient documentation

## 2017-05-25 DIAGNOSIS — Y829 Unspecified medical devices associated with adverse incidents: Secondary | ICD-10-CM | POA: Insufficient documentation

## 2017-05-25 DIAGNOSIS — Z96653 Presence of artificial knee joint, bilateral: Secondary | ICD-10-CM | POA: Insufficient documentation

## 2017-05-25 DIAGNOSIS — T8189XA Other complications of procedures, not elsewhere classified, initial encounter: Secondary | ICD-10-CM

## 2017-05-25 DIAGNOSIS — M5489 Other dorsalgia: Secondary | ICD-10-CM | POA: Diagnosis not present

## 2017-05-25 DIAGNOSIS — R030 Elevated blood-pressure reading, without diagnosis of hypertension: Secondary | ICD-10-CM | POA: Diagnosis not present

## 2017-05-25 DIAGNOSIS — I110 Hypertensive heart disease with heart failure: Secondary | ICD-10-CM | POA: Insufficient documentation

## 2017-05-25 DIAGNOSIS — I5033 Acute on chronic diastolic (congestive) heart failure: Secondary | ICD-10-CM | POA: Diagnosis not present

## 2017-05-25 DIAGNOSIS — Z96611 Presence of right artificial shoulder joint: Secondary | ICD-10-CM | POA: Diagnosis not present

## 2017-05-25 DIAGNOSIS — L7682 Other postprocedural complications of skin and subcutaneous tissue: Secondary | ICD-10-CM | POA: Diagnosis not present

## 2017-05-25 MED ORDER — SODIUM CHLORIDE 0.9 % IV SOLN
1000.0000 mg | INTRAVENOUS | Status: DC
  Administered 2017-05-25: 1000 mg via INTRAVENOUS
  Filled 2017-05-25 (×2): qty 20

## 2017-05-25 MED ORDER — DEXTROSE 5 % IV SOLN
2.0000 g | Freq: Once | INTRAVENOUS | Status: AC
  Administered 2017-05-25: 2 g via INTRAVENOUS
  Filled 2017-05-25: qty 2

## 2017-05-25 NOTE — ED Notes (Signed)
Pt voices understanding of discharge instructions. NAD at departure. Voices understanding of follow up with Dr. Cyndy Freeze for wound check. Ambulatory with steady gait at discharge.
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
## 2017-05-25 NOTE — ED Triage Notes (Signed)
Pt brought in by EMS for bleeding from surgical site on her back. Pt had surgery on her back last Sunday 10/7 for a lumbar epidural abscess.  Pt states it is bleeding more than it had been. Sx was complicated by a CSF leak. Pt wanted to come in today for an evaluation of sx site.

## 2017-05-26 ENCOUNTER — Inpatient Hospital Stay (HOSPITAL_COMMUNITY)
Admission: AD | Admit: 2017-05-26 | Discharge: 2017-06-13 | DRG: 028 | Disposition: A | Discharge status: Home Health | Payer: Medicare Other | Source: Ambulatory Visit | Attending: Neurological Surgery | Admitting: Neurological Surgery

## 2017-05-26 ENCOUNTER — Other Ambulatory Visit: Payer: Self-pay

## 2017-05-26 ENCOUNTER — Encounter (HOSPITAL_COMMUNITY): Payer: Self-pay | Admitting: *Deleted

## 2017-05-26 DIAGNOSIS — I509 Heart failure, unspecified: Secondary | ICD-10-CM | POA: Diagnosis present

## 2017-05-26 DIAGNOSIS — T8132XA Disruption of internal operation (surgical) wound, not elsewhere classified, initial encounter: Secondary | ICD-10-CM | POA: Diagnosis present

## 2017-05-26 DIAGNOSIS — G970 Cerebrospinal fluid leak from spinal puncture: Secondary | ICD-10-CM | POA: Diagnosis not present

## 2017-05-26 DIAGNOSIS — Z9049 Acquired absence of other specified parts of digestive tract: Secondary | ICD-10-CM

## 2017-05-26 DIAGNOSIS — Z96653 Presence of artificial knee joint, bilateral: Secondary | ICD-10-CM | POA: Diagnosis present

## 2017-05-26 DIAGNOSIS — F419 Anxiety disorder, unspecified: Secondary | ICD-10-CM | POA: Diagnosis not present

## 2017-05-26 DIAGNOSIS — E7800 Pure hypercholesterolemia, unspecified: Secondary | ICD-10-CM | POA: Diagnosis present

## 2017-05-26 DIAGNOSIS — Y753 Surgical instruments, materials and neurological devices (including sutures) associated with adverse incidents: Secondary | ICD-10-CM | POA: Diagnosis present

## 2017-05-26 DIAGNOSIS — G9782 Other postprocedural complications and disorders of nervous system: Secondary | ICD-10-CM | POA: Diagnosis not present

## 2017-05-26 DIAGNOSIS — Z881 Allergy status to other antibiotic agents status: Secondary | ICD-10-CM

## 2017-05-26 DIAGNOSIS — Z886 Allergy status to analgesic agent status: Secondary | ICD-10-CM | POA: Diagnosis not present

## 2017-05-26 DIAGNOSIS — M1990 Unspecified osteoarthritis, unspecified site: Secondary | ICD-10-CM | POA: Diagnosis not present

## 2017-05-26 DIAGNOSIS — G9600 Cerebrospinal fluid leak, unspecified: Secondary | ICD-10-CM | POA: Diagnosis present

## 2017-05-26 DIAGNOSIS — Z96611 Presence of right artificial shoulder joint: Secondary | ICD-10-CM | POA: Diagnosis not present

## 2017-05-26 DIAGNOSIS — K219 Gastro-esophageal reflux disease without esophagitis: Secondary | ICD-10-CM | POA: Diagnosis not present

## 2017-05-26 DIAGNOSIS — Z90710 Acquired absence of both cervix and uterus: Secondary | ICD-10-CM | POA: Diagnosis not present

## 2017-05-26 DIAGNOSIS — Z882 Allergy status to sulfonamides status: Secondary | ICD-10-CM

## 2017-05-26 DIAGNOSIS — Y839 Surgical procedure, unspecified as the cause of abnormal reaction of the patient, or of later complication, without mention of misadventure at the time of the procedure: Secondary | ICD-10-CM | POA: Diagnosis present

## 2017-05-26 DIAGNOSIS — E43 Unspecified severe protein-calorie malnutrition: Secondary | ICD-10-CM | POA: Insufficient documentation

## 2017-05-26 DIAGNOSIS — S21209A Unspecified open wound of unspecified back wall of thorax without penetration into thoracic cavity, initial encounter: Secondary | ICD-10-CM | POA: Diagnosis not present

## 2017-05-26 DIAGNOSIS — I10 Essential (primary) hypertension: Secondary | ICD-10-CM | POA: Diagnosis not present

## 2017-05-26 DIAGNOSIS — G971 Other reaction to spinal and lumbar puncture: Secondary | ICD-10-CM | POA: Diagnosis not present

## 2017-05-26 DIAGNOSIS — Z6841 Body Mass Index (BMI) 40.0 and over, adult: Secondary | ICD-10-CM | POA: Diagnosis not present

## 2017-05-26 DIAGNOSIS — I110 Hypertensive heart disease with heart failure: Secondary | ICD-10-CM | POA: Diagnosis present

## 2017-05-26 DIAGNOSIS — Z91018 Allergy to other foods: Secondary | ICD-10-CM

## 2017-05-26 DIAGNOSIS — Z9842 Cataract extraction status, left eye: Secondary | ICD-10-CM

## 2017-05-26 DIAGNOSIS — Y838 Other surgical procedures as the cause of abnormal reaction of the patient, or of later complication, without mention of misadventure at the time of the procedure: Secondary | ICD-10-CM | POA: Diagnosis present

## 2017-05-26 DIAGNOSIS — Z8249 Family history of ischemic heart disease and other diseases of the circulatory system: Secondary | ICD-10-CM

## 2017-05-26 DIAGNOSIS — Z91013 Allergy to seafood: Secondary | ICD-10-CM

## 2017-05-26 DIAGNOSIS — K589 Irritable bowel syndrome without diarrhea: Secondary | ICD-10-CM | POA: Diagnosis not present

## 2017-05-26 DIAGNOSIS — Z91012 Allergy to eggs: Secondary | ICD-10-CM

## 2017-05-26 DIAGNOSIS — Z91041 Radiographic dye allergy status: Secondary | ICD-10-CM

## 2017-05-26 DIAGNOSIS — Z961 Presence of intraocular lens: Secondary | ICD-10-CM | POA: Diagnosis not present

## 2017-05-26 DIAGNOSIS — I5033 Acute on chronic diastolic (congestive) heart failure: Secondary | ICD-10-CM | POA: Diagnosis not present

## 2017-05-26 DIAGNOSIS — N179 Acute kidney failure, unspecified: Secondary | ICD-10-CM | POA: Diagnosis not present

## 2017-05-26 LAB — CBC WITH DIFFERENTIAL/PLATELET
Basophils Absolute: 0 10*3/uL (ref 0.0–0.1)
Basophils Relative: 0 %
EOS ABS: 0.1 10*3/uL (ref 0.0–0.7)
EOS PCT: 2 %
HCT: 28 % — ABNORMAL LOW (ref 36.0–46.0)
Hemoglobin: 8.7 g/dL — ABNORMAL LOW (ref 12.0–15.0)
LYMPHS ABS: 2.1 10*3/uL (ref 0.7–4.0)
LYMPHS PCT: 34 %
MCH: 26.4 pg (ref 26.0–34.0)
MCHC: 31.1 g/dL (ref 30.0–36.0)
MCV: 85.1 fL (ref 78.0–100.0)
MONO ABS: 1.2 10*3/uL — AB (ref 0.1–1.0)
MONOS PCT: 20 %
Neutro Abs: 2.7 10*3/uL (ref 1.7–7.7)
Neutrophils Relative %: 44 %
PLATELETS: 225 10*3/uL (ref 150–400)
RBC: 3.29 MIL/uL — ABNORMAL LOW (ref 3.87–5.11)
RDW: 14.2 % (ref 11.5–15.5)
WBC: 6.2 10*3/uL (ref 4.0–10.5)

## 2017-05-26 LAB — PROTIME-INR
INR: 1.09
Prothrombin Time: 14 seconds (ref 11.4–15.2)

## 2017-05-26 LAB — TYPE AND SCREEN
ABO/RH(D): B POS
ANTIBODY SCREEN: NEGATIVE

## 2017-05-26 LAB — CALCIUM: Calcium: 8.1 mg/dL — ABNORMAL LOW (ref 8.9–10.3)

## 2017-05-26 LAB — APTT: APTT: 31 s (ref 24–36)

## 2017-05-26 MED ORDER — FUROSEMIDE 80 MG PO TABS
80.0000 mg | ORAL_TABLET | Freq: Every day | ORAL | Status: DC
  Administered 2017-05-26 – 2017-06-12 (×15): 80 mg via ORAL
  Filled 2017-05-26 (×16): qty 1

## 2017-05-26 MED ORDER — DAPTOMYCIN IV (FOR PTA / DISCHARGE USE ONLY): 1000.0000 mg | INTRAVENOUS | Status: DC

## 2017-05-26 MED ORDER — METOPROLOL TARTRATE 25 MG PO TABS
25.0000 mg | ORAL_TABLET | Freq: Every day | ORAL | Status: DC
  Administered 2017-05-26 – 2017-06-13 (×16): 25 mg via ORAL
  Filled 2017-05-26 (×17): qty 1

## 2017-05-26 MED ORDER — CLONIDINE HCL 0.3 MG/24HR TD PTWK
0.3000 mg | MEDICATED_PATCH | TRANSDERMAL | Status: DC
  Administered 2017-05-29 – 2017-06-12 (×3): 0.3 mg via TRANSDERMAL
  Filled 2017-05-26 (×3): qty 1

## 2017-05-26 MED ORDER — POTASSIUM CHLORIDE IN NACL 20-0.9 MEQ/L-% IV SOLN
INTRAVENOUS | Status: DC
  Administered 2017-05-27 – 2017-06-12 (×26): via INTRAVENOUS
  Filled 2017-05-26 (×40): qty 1000

## 2017-05-26 MED ORDER — ONDANSETRON HCL 4 MG PO TABS
4.0000 mg | ORAL_TABLET | Freq: Four times a day (QID) | ORAL | Status: DC | PRN
  Administered 2017-05-30 (×2): 4 mg via ORAL
  Filled 2017-05-26 (×2): qty 1

## 2017-05-26 MED ORDER — ALBUTEROL SULFATE HFA 108 (90 BASE) MCG/ACT IN AERS: 2.0000 | INHALATION_SPRAY | RESPIRATORY_TRACT | Status: DC | PRN

## 2017-05-26 MED ORDER — CITALOPRAM HYDROBROMIDE 40 MG PO TABS
40.0000 mg | ORAL_TABLET | Freq: Every day | ORAL | Status: DC
  Administered 2017-05-26 – 2017-06-13 (×17): 40 mg via ORAL
  Filled 2017-05-26 (×5): qty 4
  Filled 2017-05-26 (×2): qty 1
  Filled 2017-05-26 (×4): qty 4
  Filled 2017-05-26: qty 2
  Filled 2017-05-26: qty 4
  Filled 2017-05-26: qty 1
  Filled 2017-05-26 (×3): qty 4

## 2017-05-26 MED ORDER — ALBUTEROL SULFATE (2.5 MG/3ML) 0.083% IN NEBU: 2.5000 mg | INHALATION_SOLUTION | Freq: Four times a day (QID) | RESPIRATORY_TRACT | Status: DC | PRN

## 2017-05-26 MED ORDER — DEXTROSE 5 % IV SOLN
2.0000 g | INTRAVENOUS | Status: DC
  Administered 2017-05-27: 2 g via INTRAVENOUS
  Filled 2017-05-26 (×2): qty 2

## 2017-05-26 MED ORDER — CEFAZOLIN SODIUM-DEXTROSE 2-4 GM/100ML-% IV SOLN: 2.0000 g | INTRAVENOUS | Status: DC

## 2017-05-26 MED ORDER — PANTOPRAZOLE SODIUM 40 MG PO TBEC
40.0000 mg | DELAYED_RELEASE_TABLET | Freq: Every day | ORAL | Status: DC
  Administered 2017-05-26 – 2017-06-13 (×17): 40 mg via ORAL
  Filled 2017-05-26 (×17): qty 1

## 2017-05-26 MED ORDER — POTASSIUM CHLORIDE CRYS ER 20 MEQ PO TBCR
20.0000 meq | EXTENDED_RELEASE_TABLET | Freq: Two times a day (BID) | ORAL | Status: DC
  Administered 2017-05-26 – 2017-06-13 (×32): 20 meq via ORAL
  Filled 2017-05-26 (×33): qty 1

## 2017-05-26 MED ORDER — LOSARTAN POTASSIUM 50 MG PO TABS
100.0000 mg | ORAL_TABLET | Freq: Every day | ORAL | Status: DC
  Administered 2017-05-26 – 2017-06-13 (×16): 100 mg via ORAL
  Filled 2017-05-26 (×15): qty 2

## 2017-05-26 MED ORDER — OXYCODONE-ACETAMINOPHEN 5-325 MG PO TABS
1.0000 | ORAL_TABLET | Freq: Four times a day (QID) | ORAL | Status: DC | PRN
  Administered 2017-05-26: 1 via ORAL
  Administered 2017-05-28 – 2017-06-10 (×9): 2 via ORAL
  Filled 2017-05-26 (×7): qty 2
  Filled 2017-05-26: qty 1
  Filled 2017-05-26 (×2): qty 2

## 2017-05-26 MED ORDER — BUPROPION HCL ER (SR) 100 MG PO TB12
100.0000 mg | ORAL_TABLET | Freq: Every day | ORAL | Status: DC
  Administered 2017-05-26 – 2017-06-13 (×16): 100 mg via ORAL
  Filled 2017-05-26 (×21): qty 1

## 2017-05-26 MED ORDER — LOSARTAN POTASSIUM-HCTZ 100-12.5 MG PO TABS
1.0000 | ORAL_TABLET | Freq: Every day | ORAL | Status: DC
Start: 2017-05-26 — End: 2017-05-26

## 2017-05-26 MED ORDER — OXYCODONE HCL 5 MG PO TABS
5.0000 mg | ORAL_TABLET | ORAL | Status: DC | PRN
  Administered 2017-05-29 – 2017-05-30 (×4): 10 mg via ORAL
  Administered 2017-05-30: 5 mg via ORAL
  Administered 2017-05-31 – 2017-06-03 (×8): 10 mg via ORAL
  Administered 2017-06-04: 5 mg via ORAL
  Administered 2017-06-05 – 2017-06-07 (×3): 10 mg via ORAL
  Administered 2017-06-11: 5 mg via ORAL
  Administered 2017-06-12: 10 mg via ORAL
  Administered 2017-06-12: 5 mg via ORAL
  Administered 2017-06-13: 10 mg via ORAL
  Filled 2017-05-26: qty 1
  Filled 2017-05-26 (×11): qty 2
  Filled 2017-05-26 (×2): qty 1
  Filled 2017-05-26 (×7): qty 2

## 2017-05-26 MED ORDER — SODIUM CHLORIDE 0.9 % IV SOLN
1000.0000 mg | INTRAVENOUS | Status: DC
  Administered 2017-05-27: 1000 mg via INTRAVENOUS
  Filled 2017-05-26 (×2): qty 20

## 2017-05-26 MED ORDER — SIMVASTATIN 20 MG PO TABS
20.0000 mg | ORAL_TABLET | Freq: Every evening | ORAL | Status: DC
  Administered 2017-05-26: 20 mg via ORAL
  Filled 2017-05-26: qty 1

## 2017-05-26 MED ORDER — METRONIDAZOLE 500 MG PO TABS
500.0000 mg | ORAL_TABLET | Freq: Three times a day (TID) | ORAL | Status: DC
Start: 2017-05-26 — End: 2017-05-26

## 2017-05-26 MED ORDER — OXYCODONE HCL ER 10 MG PO T12A
10.0000 mg | EXTENDED_RELEASE_TABLET | Freq: Two times a day (BID) | ORAL | Status: DC
  Administered 2017-05-26 – 2017-06-13 (×35): 10 mg via ORAL
  Filled 2017-05-26 (×36): qty 1

## 2017-05-26 MED ORDER — CEFTRIAXONE IV (FOR PTA / DISCHARGE USE ONLY): 2.0000 g | INTRAVENOUS | Status: DC

## 2017-05-26 MED ORDER — HYDROCHLOROTHIAZIDE 12.5 MG PO CAPS
12.5000 mg | ORAL_CAPSULE | Freq: Every day | ORAL | Status: DC
  Administered 2017-05-26 – 2017-06-13 (×16): 12.5 mg via ORAL
  Filled 2017-05-26 (×16): qty 1

## 2017-05-26 NOTE — Progress Notes (Signed)
Received as direct admit from home; Dr.Ditty office called for orders; awaiting orders. Patient oriented to room and unit routine; assisted into bed; placed in gown; patient's dressing changed to her lumbar region due to c/o feeling damp in the area; sutures intact; wound approximated and has serous drain; small amount.

## 2017-05-27 ENCOUNTER — Inpatient Hospital Stay: Admit: 2017-05-27 | Payer: Medicare Other | Admitting: Neurological Surgery

## 2017-05-27 LAB — SURGICAL PCR SCREEN
MRSA, PCR: NEGATIVE
STAPHYLOCOCCUS AUREUS: NEGATIVE

## 2017-05-27 NOTE — Progress Notes (Signed)
Surgery to be rescheduled to tomorrow AM Will discuss with patient NPO after midnight

## 2017-05-27 NOTE — Care Management Note (Signed)
Case Management Note  Patient Details  Name: JASMINNE MEALY MRN: [id number] Date of Birth: [birth year]  Subjective/Objective:   Pt with postoperative CSF leak. She is from home with a roommate and aide. She was active with [facility] for IV antibiotics prior to admission.                  Action/Plan: Plan is for her to go to OR today. CM following for d/c needs post surgery.  Expected Discharge Date:                  Expected Discharge Plan:  [facility]  In-House Referral:     Discharge planning Services  CM Consult  Post Acute Care Choice:  Home Health Choice offered to:  Patient  DME Arranged:    DME Agency:     HH Arranged:  RN Tib[place] Agency:  [facility]  Status of Service:  In process, will continue to follow  If discussed at Long Length of Stay Meetings, dates discussed:    Additional Comments:  Pollie Friar, RN 05/27/2017, 11:38 AM

## 2017-05-27 NOTE — Progress Notes (Signed)
[facility] hospital team will follow pt while inpatient to support [facility] needs at DC.  If patient discharges after hours, please call [phone].   [name] 05/27/2017, 2:29 PM
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
## 2017-05-28 ENCOUNTER — Inpatient Hospital Stay (HOSPITAL_COMMUNITY): Payer: Medicare Other | Admitting: Anesthesiology

## 2017-05-28 ENCOUNTER — Inpatient Hospital Stay (HOSPITAL_COMMUNITY)
Admission: AD | Disposition: A | Discharge status: Home Health | Payer: Self-pay | Source: Ambulatory Visit | Attending: Neurological Surgery

## 2017-05-28 ENCOUNTER — Encounter (HOSPITAL_COMMUNITY): Payer: Self-pay | Admitting: Anesthesiology

## 2017-05-28 HISTORY — PX: LUMBAR WOUND DEBRIDEMENT: SHX1988

## 2017-05-28 HISTORY — PX: PLACEMENT OF LUMBAR DRAIN: SHX6028

## 2017-05-28 SURGERY — LUMBAR WOUND DEBRIDEMENT
Anesthesia: General | Site: Spine Lumbar

## 2017-05-28 MED ORDER — FENTANYL CITRATE (PF) 100 MCG/2ML IJ SOLN
25.0000 ug | INTRAMUSCULAR | Status: DC | PRN
  Administered 2017-05-28 (×3): 25 ug via INTRAVENOUS
  Administered 2017-05-28: 50 ug via INTRAVENOUS
  Administered 2017-05-28: 25 ug via INTRAVENOUS

## 2017-05-28 MED ORDER — OXYCODONE HCL 5 MG PO TABS: 5.0000 mg | ORAL_TABLET | Freq: Once | ORAL | Status: DC | PRN

## 2017-05-28 MED ORDER — BUPIVACAINE LIPOSOME 1.3 % IJ SUSP
20.0000 mL | Freq: Once | INTRAMUSCULAR | Status: DC
  Filled 2017-05-28: qty 20

## 2017-05-28 MED ORDER — HEMOSTATIC AGENTS (NO CHARGE) OPTIME
TOPICAL | Status: DC | PRN
  Administered 2017-05-28: 1 via TOPICAL

## 2017-05-28 MED ORDER — ROCURONIUM BROMIDE 100 MG/10ML IV SOLN
INTRAVENOUS | Status: DC | PRN
  Administered 2017-05-28: 30 mg via INTRAVENOUS

## 2017-05-28 MED ORDER — BUPIVACAINE-EPINEPHRINE (PF) 0.5% -1:200000 IJ SOLN
INTRAMUSCULAR | Status: AC
  Filled 2017-05-28: qty 30

## 2017-05-28 MED ORDER — CHLORHEXIDINE GLUCONATE CLOTH 2 % EX PADS
6.0000 | MEDICATED_PAD | Freq: Every day | CUTANEOUS | Status: DC
  Administered 2017-05-28 – 2017-06-01 (×5): 6 via TOPICAL

## 2017-05-28 MED ORDER — 0.9 % SODIUM CHLORIDE (POUR BTL) OPTIME
TOPICAL | Status: DC | PRN
  Administered 2017-05-28: 1000 mL

## 2017-05-28 MED ORDER — FLUCONAZOLE 200 MG PO TABS: 200.0000 mg | ORAL_TABLET | Freq: Every day | ORAL | Status: DC

## 2017-05-28 MED ORDER — KETOROLAC TROMETHAMINE 30 MG/ML IJ SOLN
INTRAMUSCULAR | Status: AC
  Filled 2017-05-28: qty 1

## 2017-05-28 MED ORDER — HYDROMORPHONE HCL 1 MG/ML IJ SOLN
2.0000 mg | INTRAMUSCULAR | Status: AC | PRN
  Administered 2017-05-28 (×2): 2 mg via INTRAVENOUS
  Filled 2017-05-28 (×2): qty 2

## 2017-05-28 MED ORDER — FENTANYL CITRATE (PF) 100 MCG/2ML IJ SOLN
INTRAMUSCULAR | Status: DC | PRN
  Administered 2017-05-28: 50 ug via INTRAVENOUS
  Administered 2017-05-28: 25 ug via INTRAVENOUS
  Administered 2017-05-28: 75 ug via INTRAVENOUS
  Administered 2017-05-28: 25 ug via INTRAVENOUS
  Administered 2017-05-28: 75 ug via INTRAVENOUS

## 2017-05-28 MED ORDER — THROMBIN 5000 UNITS EX SOLR
CUTANEOUS | Status: DC | PRN
  Administered 2017-05-28 (×2): 5000 [IU] via TOPICAL

## 2017-05-28 MED ORDER — THROMBIN 5000 UNITS EX SOLR
OROMUCOSAL | Status: DC | PRN
  Administered 2017-05-28: 20 mL via TOPICAL

## 2017-05-28 MED ORDER — MIDAZOLAM HCL 2 MG/2ML IJ SOLN
INTRAMUSCULAR | Status: AC
  Filled 2017-05-28: qty 2

## 2017-05-28 MED ORDER — PHENYLEPHRINE 40 MCG/ML (10ML) SYRINGE FOR IV PUSH (FOR BLOOD PRESSURE SUPPORT)
PREFILLED_SYRINGE | INTRAVENOUS | Status: AC
  Filled 2017-05-28: qty 10

## 2017-05-28 MED ORDER — BUPIVACAINE-EPINEPHRINE (PF) 0.5% -1:200000 IJ SOLN
INTRAMUSCULAR | Status: DC | PRN
  Administered 2017-05-28: 15 mL

## 2017-05-28 MED ORDER — OXYCODONE HCL 5 MG/5ML PO SOLN: 5.0000 mg | Freq: Once | ORAL | Status: DC | PRN

## 2017-05-28 MED ORDER — ROCURONIUM BROMIDE 10 MG/ML (PF) SYRINGE
PREFILLED_SYRINGE | INTRAVENOUS | Status: AC
  Filled 2017-05-28: qty 5

## 2017-05-28 MED ORDER — METHYLPREDNISOLONE ACETATE 80 MG/ML IJ SUSP
INTRAMUSCULAR | Status: AC
  Filled 2017-05-28: qty 1

## 2017-05-28 MED ORDER — SODIUM CHLORIDE 0.9 % IJ SOLN
INTRAMUSCULAR | Status: DC | PRN
  Administered 2017-05-28 (×2): 10 mL

## 2017-05-28 MED ORDER — PROPOFOL 10 MG/ML IV BOLUS
INTRAVENOUS | Status: AC
  Filled 2017-05-28: qty 20

## 2017-05-28 MED ORDER — BUPIVACAINE HCL (PF) 0.25 % IJ SOLN
INTRAMUSCULAR | Status: AC
  Filled 2017-05-28: qty 30

## 2017-05-28 MED ORDER — HYDROMORPHONE HCL 1 MG/ML IJ SOLN
1.0000 mg | INTRAMUSCULAR | Status: DC | PRN
  Administered 2017-05-28: 1 mg via INTRAVENOUS
  Filled 2017-05-28: qty 1

## 2017-05-28 MED ORDER — SUGAMMADEX SODIUM 200 MG/2ML IV SOLN
INTRAVENOUS | Status: AC
  Filled 2017-05-28: qty 2

## 2017-05-28 MED ORDER — FENTANYL CITRATE (PF) 250 MCG/5ML IJ SOLN
INTRAMUSCULAR | Status: AC
  Filled 2017-05-28: qty 5

## 2017-05-28 MED ORDER — LIDOCAINE-EPINEPHRINE 2 %-1:100000 IJ SOLN
INTRAMUSCULAR | Status: AC
  Filled 2017-05-28: qty 1

## 2017-05-28 MED ORDER — PHENYLEPHRINE HCL 10 MG/ML IJ SOLN
INTRAVENOUS | Status: DC | PRN
  Administered 2017-05-28: 25 ug/min via INTRAVENOUS

## 2017-05-28 MED ORDER — PROPOFOL 10 MG/ML IV BOLUS
INTRAVENOUS | Status: DC | PRN
  Administered 2017-05-28: 100 mg via INTRAVENOUS

## 2017-05-28 MED ORDER — FENTANYL CITRATE (PF) 100 MCG/2ML IJ SOLN
INTRAMUSCULAR | Status: AC
  Filled 2017-05-28: qty 2

## 2017-05-28 MED ORDER — THROMBIN 5000 UNITS EX SOLR
CUTANEOUS | Status: AC
  Filled 2017-05-28: qty 15000

## 2017-05-28 MED ORDER — ONDANSETRON HCL 4 MG/2ML IJ SOLN
INTRAMUSCULAR | Status: AC
  Filled 2017-05-28: qty 2

## 2017-05-28 MED ORDER — SODIUM CHLORIDE 0.9 % IV SOLN: 1000.0000 mg | INTRAVENOUS | Status: DC

## 2017-05-28 MED ORDER — BUPIVACAINE LIPOSOME 1.3 % IJ SUSP
INTRAMUSCULAR | Status: DC | PRN
  Administered 2017-05-28: 20 mL

## 2017-05-28 MED ORDER — LACTATED RINGERS IV SOLN
INTRAVENOUS | Status: DC | PRN
  Administered 2017-05-28: 08:00:00 via INTRAVENOUS

## 2017-05-28 MED ORDER — ONDANSETRON HCL 4 MG/2ML IJ SOLN: 4.0000 mg | Freq: Four times a day (QID) | INTRAMUSCULAR | Status: DC | PRN

## 2017-05-28 MED ORDER — PHENYLEPHRINE 40 MCG/ML (10ML) SYRINGE FOR IV PUSH (FOR BLOOD PRESSURE SUPPORT)
PREFILLED_SYRINGE | INTRAVENOUS | Status: DC | PRN
  Administered 2017-05-28: 120 ug via INTRAVENOUS

## 2017-05-28 MED ORDER — MIDAZOLAM HCL 5 MG/5ML IJ SOLN
INTRAMUSCULAR | Status: DC | PRN
  Administered 2017-05-28: 2 mg via INTRAVENOUS

## 2017-05-28 MED ORDER — DEXTROSE 5 % IV SOLN: 2.0000 g | INTRAVENOUS | Status: DC

## 2017-05-28 MED ORDER — METHOCARBAMOL 500 MG PO TABS
750.0000 mg | ORAL_TABLET | Freq: Four times a day (QID) | ORAL | Status: DC
  Administered 2017-05-28 – 2017-05-29 (×4): 750 mg via ORAL
  Filled 2017-05-28 (×4): qty 2

## 2017-05-28 MED ORDER — SODIUM CHLORIDE 0.9 % IJ SOLN
INTRAMUSCULAR | Status: AC
  Filled 2017-05-28: qty 20

## 2017-05-28 MED ORDER — DEXTROSE 5 % IV SOLN
2.0000 g | INTRAVENOUS | Status: DC
  Administered 2017-05-28 – 2017-06-07 (×11): 2 g via INTRAVENOUS
  Filled 2017-05-28 (×12): qty 2

## 2017-05-28 MED ORDER — SODIUM CHLORIDE 0.9 % IR SOLN
Status: DC | PRN
  Administered 2017-05-28: 500 mL

## 2017-05-28 MED ORDER — SODIUM CHLORIDE 0.9 % IV SOLN
1000.0000 mg | INTRAVENOUS | Status: DC
  Administered 2017-05-28 – 2017-06-07 (×11): 1000 mg via INTRAVENOUS
  Filled 2017-05-28 (×13): qty 20

## 2017-05-28 MED ORDER — SUGAMMADEX SODIUM 200 MG/2ML IV SOLN
INTRAVENOUS | Status: DC | PRN
  Administered 2017-05-28: 200 mg via INTRAVENOUS

## 2017-05-28 MED ORDER — ONDANSETRON HCL 4 MG/2ML IJ SOLN
INTRAMUSCULAR | Status: DC | PRN
  Administered 2017-05-28: 4 mg via INTRAVENOUS

## 2017-05-28 MED ORDER — LIDOCAINE-EPINEPHRINE 2 %-1:100000 IJ SOLN
INTRAMUSCULAR | Status: DC | PRN
  Administered 2017-05-28: 15 mL

## 2017-05-28 SURGICAL SUPPLY — 98 items
ADH SKN CLS APL DERMABOND .7 (GAUZE/BANDAGES/DRESSINGS) ×1
APL SKNCLS STERI-STRIP NONHPOA (GAUZE/BANDAGES/DRESSINGS)
BAG DECANTER FOR FLEXI CONT (MISCELLANEOUS) ×3 IMPLANT
BENZOIN TINCTURE PRP APPL 2/3 (GAUZE/BANDAGES/DRESSINGS) IMPLANT
BIT DRILL NEURO 2X3.1 SFT TUCH (MISCELLANEOUS) ×1 IMPLANT
BLADE CLIPPER SURG (BLADE) IMPLANT
BLADE SURG 11 STRL SS (BLADE) ×1 IMPLANT
BLADE SURG 15 STRL LF DISP TIS (BLADE) ×1 IMPLANT
BLADE SURG 15 STRL SS (BLADE)
BUR ROUND FLUTED 5 RND (BURR) ×1 IMPLANT
BUR ROUND FLUTED 5MM RND (BURR)
CANISTER SUCT 3000ML PPV (MISCELLANEOUS) ×6 IMPLANT
CARTRIDGE OIL MAESTRO DRILL (MISCELLANEOUS) ×1 IMPLANT
CHLORAPREP W/TINT 26ML (MISCELLANEOUS) ×1 IMPLANT
CLOSURE WOUND 1/2 X4 (GAUZE/BANDAGES/DRESSINGS)
CONT SPEC 4OZ CLIKSEAL STRL BL (MISCELLANEOUS) ×1 IMPLANT
DECANTER SPIKE VIAL GLASS SM (MISCELLANEOUS) ×3 IMPLANT
DERMABOND ADVANCED (GAUZE/BANDAGES/DRESSINGS) ×2
DERMABOND ADVANCED .7 DNX12 (GAUZE/BANDAGES/DRESSINGS) ×2 IMPLANT
DIFFUSER DRILL AIR PNEUMATIC (MISCELLANEOUS) ×1 IMPLANT
DRAIN SUBARACHNOID (WOUND CARE) ×2 IMPLANT
DRAPE C-ARM 42X72 X-RAY (DRAPES) ×1 IMPLANT
DRAPE HALF SHEET 40X57 (DRAPES) ×3 IMPLANT
DRAPE LAPAROTOMY 100X72X124 (DRAPES) ×3 IMPLANT
DRAPE MICROSCOPE LEICA (MISCELLANEOUS) ×1 IMPLANT
DRAPE POUCH INSTRU U-SHP 10X18 (DRAPES) ×3 IMPLANT
DRAPE SURG 17X23 STRL (DRAPES) ×10 IMPLANT
DRILL NEURO 2X3.1 SOFT TOUCH (MISCELLANEOUS)
DRSG OPSITE POSTOP 4X10 (GAUZE/BANDAGES/DRESSINGS) ×2 IMPLANT
DURAPREP 26ML APPLICATOR (WOUND CARE) ×1 IMPLANT
ELECT COATED BLADE 2.86 ST (ELECTRODE) ×3 IMPLANT
ELECT REM PT RETURN 9FT ADLT (ELECTROSURGICAL) ×3
ELECTRODE REM PT RTRN 9FT ADLT (ELECTROSURGICAL) ×1 IMPLANT
GAUZE SPONGE 4X4 12PLY STRL (GAUZE/BANDAGES/DRESSINGS) IMPLANT
GAUZE SPONGE 4X4 16PLY XRAY LF (GAUZE/BANDAGES/DRESSINGS) ×1 IMPLANT
GLOVE BIO SURGEON STRL SZ7 (GLOVE) IMPLANT
GLOVE BIO SURGEON STRL SZ7.5 (GLOVE) IMPLANT
GLOVE BIOGEL PI IND STRL 7.0 (GLOVE) IMPLANT
GLOVE BIOGEL PI IND STRL 7.5 (GLOVE) ×2 IMPLANT
GLOVE BIOGEL PI INDICATOR 7.0 (GLOVE) ×2
GLOVE BIOGEL PI INDICATOR 7.5 (GLOVE) ×2
GLOVE EXAM NITRILE LRG STRL (GLOVE) IMPLANT
GLOVE EXAM NITRILE XL STR (GLOVE) IMPLANT
GLOVE EXAM NITRILE XS STR PU (GLOVE) IMPLANT
GLOVE SS BIOGEL STRL SZ 7.5 (GLOVE) ×4 IMPLANT
GLOVE SUPERSENSE BIOGEL SZ 7.5 (GLOVE) ×4
GLOVE SURG SS PI 7.0 STRL IVOR (GLOVE) ×4 IMPLANT
GOWN STRL REUS W/ TWL LRG LVL3 (GOWN DISPOSABLE) ×3 IMPLANT
GOWN STRL REUS W/ TWL XL LVL3 (GOWN DISPOSABLE) IMPLANT
GOWN STRL REUS W/TWL 2XL LVL3 (GOWN DISPOSABLE) IMPLANT
GOWN STRL REUS W/TWL LRG LVL3 (GOWN DISPOSABLE) ×6
GOWN STRL REUS W/TWL XL LVL3 (GOWN DISPOSABLE)
GRAFT DURAGEN MATRIX 2WX2L ×2 IMPLANT
HEMOSTAT POWDER KIT SURGIFOAM (HEMOSTASIS) ×1 IMPLANT
KIT BASIN OR (CUSTOM PROCEDURE TRAY) ×4 IMPLANT
KIT ROOM TURNOVER OR (KITS) ×4 IMPLANT
NDL HYPO 18GX1.5 BLUNT FILL (NEEDLE) ×1 IMPLANT
NDL HYPO 21X1.5 SAFETY (NEEDLE) ×2 IMPLANT
NDL HYPO 25X1 1.5 SAFETY (NEEDLE) ×2 IMPLANT
NEEDLE HYPO 18GX1.5 BLUNT FILL (NEEDLE) IMPLANT
NEEDLE HYPO 21X1.5 SAFETY (NEEDLE) ×3 IMPLANT
NEEDLE HYPO 25X1 1.5 SAFETY (NEEDLE) IMPLANT
NS IRRIG 1000ML POUR BTL (IV SOLUTION) ×4 IMPLANT
OIL CARTRIDGE MAESTRO DRILL (MISCELLANEOUS)
PACK LAMINECTOMY NEURO (CUSTOM PROCEDURE TRAY) ×3 IMPLANT
PACK SURGICAL SETUP 50X90 (CUSTOM PROCEDURE TRAY) ×1 IMPLANT
PACK UNIVERSAL I (CUSTOM PROCEDURE TRAY) ×3 IMPLANT
PAD ARMBOARD 7.5X6 YLW CONV (MISCELLANEOUS) ×18 IMPLANT
PATTIES SURGICAL .5X1.5 (GAUZE/BANDAGES/DRESSINGS) ×1 IMPLANT
RUBBERBAND STERILE (MISCELLANEOUS) ×2 IMPLANT
SEALANT ADHERUS EXTEND TIP (MISCELLANEOUS) ×2 IMPLANT
SPECIMEN JAR SMALL (MISCELLANEOUS) IMPLANT
SPONGE NEURO XRAY DETECT 1X3 (DISPOSABLE) ×1 IMPLANT
SPONGE SURGIFOAM ABS GEL SZ50 (HEMOSTASIS) ×3 IMPLANT
STAPLER VISISTAT 35W (STAPLE) ×2 IMPLANT
STRIP CLOSURE SKIN 1/2X4 (GAUZE/BANDAGES/DRESSINGS) IMPLANT
SUT ETHILON 3 0 PS 1 (SUTURE) ×4 IMPLANT
SUT PROLENE 1 CT (SUTURE) ×6 IMPLANT
SUT SILK 0 TIES 10X30 (SUTURE) ×2 IMPLANT
SUT STRATAFIX MNCRL+ 3-0 PS-2 (SUTURE)
SUT STRATAFIX MONOCRYL 3-0 (SUTURE)
SUT VIC AB 0 CT1 18XCR BRD8 (SUTURE) ×1 IMPLANT
SUT VIC AB 0 CT1 8-18 (SUTURE) ×3
SUT VIC AB 1 CT1 18XBRD ANBCTR (SUTURE) IMPLANT
SUT VIC AB 1 CT1 8-18 (SUTURE) ×6
SUT VIC AB 2-0 CT1 18 (SUTURE) ×1 IMPLANT
SUT VIC AB 4-0 PS2 27 (SUTURE) IMPLANT
SUT VICRYL 3-0 RB1 18 ABS (SUTURE) ×1 IMPLANT
SUTURE STRATFX MNCRL+ 3-0 PS-2 (SUTURE) IMPLANT
SYR 30ML LL (SYRINGE) ×4 IMPLANT
SYR 5ML LL (SYRINGE) ×1 IMPLANT
SYR CONTROL 10ML LL (SYRINGE) ×2 IMPLANT
SYSTEM LIMITORR VOL LMT 20ML (CATHETERS) ×2 IMPLANT
TOWEL GREEN STERILE (TOWEL DISPOSABLE) ×4 IMPLANT
TOWEL GREEN STERILE FF (TOWEL DISPOSABLE) ×4 IMPLANT
TUBE CONNECTING 12'X1/4 (SUCTIONS)
TUBE CONNECTING 12X1/4 (SUCTIONS) ×1 IMPLANT
WATER STERILE IRR 1000ML POUR (IV SOLUTION) ×3 IMPLANT

## 2017-05-28 NOTE — Progress Notes (Signed)
   05/28/17 1400  Clinical Encounter Type  Visited With Patient  Visit Type Initial  Referral From Nurse  Consult/Referral To Chaplain  Spiritual Encounters  Spiritual Needs Prayer  Stress Factors  Patient Stress Factors Health changes  Nurse asked Chaplain to visit with patient that was yelling and experiencing pain post surgery.  The patient was saying they needed more meds but in fact just needed to take time for the meds to kick in.  I calmed the patient for a few minutes before she went back to yelling. She did not hold conversation or have specific needs that could be addressed by chaplain.

## 2017-05-28 NOTE — Anesthesia Procedure Notes (Signed)
Procedure Name: Intubation Date/Time: 05/28/2017 8:41 AM Performed by: Kyung Rudd Pre-anesthesia Checklist: Patient identified, Emergency Drugs available, Suction available and Patient being monitored Patient Re-evaluated:Patient Re-evaluated prior to induction Oxygen Delivery Method: Circle system utilized Preoxygenation: Pre-oxygenation with 100% oxygen Induction Type: IV induction Ventilation: Mask ventilation without difficulty Laryngoscope Size: Mac and 3 Grade View: Grade I Tube type: Oral Tube size: 7.0 mm Number of attempts: 1 Airway Equipment and Method: Stylet Placement Confirmation: ETT inserted through vocal cords under direct vision,  positive ETCO2 and breath sounds checked- equal and bilateral Secured at: 21 cm Tube secured with: Tape Dental Injury: Teeth and Oropharynx as per pre-operative assessment

## 2017-05-28 NOTE — Anesthesia Preprocedure Evaluation (Addendum)
Anesthesia Evaluation  Patient identified by MRN, date of birth, ID band Patient awake    Reviewed: Allergy & Precautions, H&P , NPO status , Patient's Chart, lab work & pertinent test results  Airway Mallampati: I   Neck ROM: full    Dental  (+) Teeth Intact   Pulmonary neg pulmonary ROS,    breath sounds clear to auscultation       Cardiovascular hypertension, +CHF   Rhythm:regular Rate:Normal     Neuro/Psych PSYCHIATRIC DISORDERS Anxiety    GI/Hepatic GERD  ,  Endo/Other  Morbid obesity  Renal/GU      Musculoskeletal  (+) Arthritis ,   Abdominal   Peds  Hematology  (+) Blood dyscrasia, anemia ,   Anesthesia Other Findings   Reproductive/Obstetrics                            Anesthesia Physical Anesthesia Plan  ASA: III  Anesthesia Plan: General   Post-op Pain Management:    Induction: Intravenous  PONV Risk Score and Plan: 3 and Ondansetron, Dexamethasone and Midazolam  Airway Management Planned: Oral ETT  Additional Equipment:   Intra-op Plan:   Post-operative Plan: Extubation in OR  Informed Consent: I have reviewed the patients History and Physical, chart, labs and discussed the procedure including the risks, benefits and alternatives for the proposed anesthesia with the patient or authorized representative who has indicated his/her understanding and acceptance.     Plan Discussed with: CRNA, Anesthesiologist and Surgeon  Anesthesia Plan Comments:         Anesthesia Quick Evaluation

## 2017-05-28 NOTE — Brief Op Note (Signed)
05/26/2017 - 05/28/2017  10:21 AM  PATIENT:  [name]  66 y.o. female  PRE-OPERATIVE DIAGNOSIS:  CSF Leak  POST-OPERATIVE DIAGNOSIS:  CSF leak  PROCEDURE:  Procedure(s): Lumbar wound exploration and placement of lumbar drain (N/A) PLACEMENT OF LUMBAR DRAIN (N/A)  SURGEON:  Surgeon(s) and Role:    * Samyak Sackmann, Kevan Ny, MD - Primary  PHYSICIAN ASSISTANT:   ASSISTANTS: none   ANESTHESIA:   general  EBL:  No intake/output data recorded.  BLOOD ADMINISTERED:none  DRAINS: Lumbar drain  LOCAL MEDICATIONS USED:  MARCAINE    and LIDOCAINE   SPECIMEN:  No Specimen  DISPOSITION OF SPECIMEN:  N/A  COUNTS:  YES  TOURNIQUET:  * No tourniquets in log *  DICTATION: .Dragon Dictation  PLAN OF CARE: Admit to inpatient   PATIENT DISPOSITION:  ICU - extubated and stable.   Delay start of Pharmacological VTE agent (>24hrs) due to surgical blood loss or risk of bleeding: yes

## 2017-05-28 NOTE — Progress Notes (Signed)
Pt left unit for surgical procedure

## 2017-05-28 NOTE — Transfer of Care (Signed)
Immediate Anesthesia Transfer of Care Note  Patient: [name]  Procedure(s) Performed: Lumbar wound exploration and placement of lumbar drain (N/A Spine Lumbar) PLACEMENT OF LUMBAR DRAIN (N/A Spine Lumbar)  Patient Location: PACU  Anesthesia Type:General  Level of Consciousness: awake, alert  and oriented  Airway & Oxygen Therapy: Patient Spontanous Breathing and Patient connected to nasal cannula oxygen  Post-op Assessment: Report given to RN, Post -op Vital signs reviewed and stable and Patient moving all extremities X 4  Post vital signs: Reviewed and stable  Last Vitals:  Vitals:   05/28/17 0520 05/28/17 1034  BP: (!) 148/87   Pulse: 88   Resp: 18   Temp: 36.7 C (!) 36.4 C  SpO2: 100%     Last Pain:  Vitals:   05/28/17 0520  TempSrc: Oral  PainSc:       Patients Stated Pain Goal: 3 (66/81/59 4707)  Complications: No apparent anesthesia complications

## 2017-05-28 NOTE — Progress Notes (Signed)
RN entered room to check lumbar drain at 1600. The drain was empty. Dr. Cyndy Freeze paged and [place] PA came and flushed drain. Will continue to monitor

## 2017-05-28 NOTE — Progress Notes (Signed)
No acute overnight events Moving legs well All questions answered To OR for CSF leak repair

## 2017-05-28 NOTE — Progress Notes (Signed)
Pt underwent lumbar wound exploration due to CSF leak Lumbar drain placed. Should continue to drain at 10cc/hour. She is to remain flat. Does have what looks like fairly diffuse fungal rash. Tx with diflucan po x7 days

## 2017-05-29 MED ORDER — DIAZEPAM 5 MG/ML IJ SOLN
5.0000 mg | Freq: Four times a day (QID) | INTRAMUSCULAR | Status: DC | PRN
  Administered 2017-05-29 – 2017-06-10 (×14): 5 mg via INTRAVENOUS
  Filled 2017-05-29 (×15): qty 2

## 2017-05-29 MED ORDER — CARISOPRODOL 350 MG PO TABS
350.0000 mg | ORAL_TABLET | Freq: Three times a day (TID) | ORAL | Status: DC | PRN
  Administered 2017-05-29 – 2017-06-12 (×14): 350 mg via ORAL
  Filled 2017-05-29 (×14): qty 1

## 2017-05-29 MED ORDER — CLOTRIMAZOLE 2 % VA CREA
1.0000 | TOPICAL_CREAM | Freq: Every day | VAGINAL | Status: AC
  Administered 2017-05-29 – 2017-05-31 (×3): 1 via VAGINAL
  Filled 2017-05-29: qty 21

## 2017-05-29 MED ORDER — NYSTATIN 100000 UNIT/GM EX CREA
TOPICAL_CREAM | Freq: Two times a day (BID) | CUTANEOUS | Status: DC
  Administered 2017-05-29: 1 via TOPICAL
  Administered 2017-05-29 – 2017-06-01 (×7): via TOPICAL
  Administered 2017-06-02: 1 via TOPICAL
  Administered 2017-06-03 – 2017-06-04 (×4): via TOPICAL
  Administered 2017-06-05: 1 via TOPICAL
  Administered 2017-06-05 – 2017-06-07 (×4): via TOPICAL
  Administered 2017-06-07 – 2017-06-09 (×4): 1 via TOPICAL
  Administered 2017-06-09 – 2017-06-13 (×8): via TOPICAL
  Filled 2017-05-29 (×3): qty 15

## 2017-05-29 MED ORDER — HYDROMORPHONE HCL 1 MG/ML IJ SOLN
1.0000 mg | INTRAMUSCULAR | Status: DC | PRN
  Administered 2017-05-29: 1 mg via INTRAVENOUS
  Administered 2017-05-29 (×2): 2 mg via INTRAVENOUS
  Administered 2017-05-29: 1 mg via INTRAVENOUS
  Administered 2017-05-30 (×2): 2 mg via INTRAVENOUS
  Administered 2017-05-30: 1 mg via INTRAVENOUS
  Administered 2017-05-30 (×2): 2 mg via INTRAVENOUS
  Administered 2017-05-30: 1 mg via INTRAVENOUS
  Administered 2017-05-31 – 2017-06-03 (×11): 2 mg via INTRAVENOUS
  Administered 2017-06-04: 1 mg via INTRAVENOUS
  Administered 2017-06-04: 2 mg via INTRAVENOUS
  Administered 2017-06-05: 1 mg via INTRAVENOUS
  Administered 2017-06-05: 2 mg via INTRAVENOUS
  Administered 2017-06-05: 1 mg via INTRAVENOUS
  Administered 2017-06-06: 2 mg via INTRAVENOUS
  Administered 2017-06-06 – 2017-06-11 (×15): 1 mg via INTRAVENOUS
  Filled 2017-05-29 (×2): qty 2
  Filled 2017-05-29 (×2): qty 1
  Filled 2017-05-29: qty 2
  Filled 2017-05-29 (×3): qty 1
  Filled 2017-05-29: qty 2
  Filled 2017-05-29: qty 1
  Filled 2017-05-29 (×6): qty 2
  Filled 2017-05-29: qty 1
  Filled 2017-05-29 (×2): qty 2
  Filled 2017-05-29: qty 1
  Filled 2017-05-29: qty 2
  Filled 2017-05-29 (×2): qty 1
  Filled 2017-05-29: qty 2
  Filled 2017-05-29 (×4): qty 1
  Filled 2017-05-29 (×4): qty 2
  Filled 2017-05-29: qty 1
  Filled 2017-05-29 (×3): qty 2
  Filled 2017-05-29: qty 1
  Filled 2017-05-29 (×2): qty 2
  Filled 2017-05-29 (×4): qty 1

## 2017-05-29 NOTE — Progress Notes (Signed)
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

## 2017-05-29 NOTE — Progress Notes (Signed)
Late entry from last night. Received call from nursing regarding no output from lumbar drain. Went to assess drain. Flushed. Aspirated bloody fluid. Clamped and advised to unclamp in several hours & call should it continue not to drain.

## 2017-05-30 ENCOUNTER — Other Ambulatory Visit: Payer: Self-pay | Admitting: Neurological Surgery

## 2017-05-30 ENCOUNTER — Encounter (HOSPITAL_COMMUNITY): Payer: Self-pay | Admitting: Neurological Surgery

## 2017-05-30 MED ORDER — SENNOSIDES-DOCUSATE SODIUM 8.6-50 MG PO TABS
1.0000 | ORAL_TABLET | Freq: Two times a day (BID) | ORAL | Status: DC
  Administered 2017-05-30 – 2017-06-12 (×25): 1 via ORAL
  Filled 2017-05-30 (×27): qty 1

## 2017-05-30 MED ORDER — ONDANSETRON HCL 4 MG/2ML IJ SOLN
4.0000 mg | Freq: Four times a day (QID) | INTRAMUSCULAR | Status: DC | PRN
  Administered 2017-05-30 – 2017-06-02 (×4): 4 mg via INTRAVENOUS
  Filled 2017-05-30 (×3): qty 2

## 2017-05-30 MED ORDER — BISACODYL 5 MG PO TBEC
5.0000 mg | DELAYED_RELEASE_TABLET | Freq: Every day | ORAL | Status: DC | PRN
  Administered 2017-05-30 – 2017-06-02 (×4): 5 mg via ORAL
  Filled 2017-05-30 (×4): qty 1

## 2017-05-30 NOTE — Progress Notes (Signed)
No acute events Still leaking CSF Lumbar drain working Have discussed at length with Dr. Marla Roe, will plan joint procedure with her, Dr. Ronnald Ramp, and myself on Monday Continue lumbar drain

## 2017-05-30 NOTE — Anesthesia Postprocedure Evaluation (Signed)
Anesthesia Post Note  Patient: [name]  Procedure(s) Performed: Lumbar wound exploration and placement of lumbar drain (N/A Spine Lumbar) PLACEMENT OF LUMBAR DRAIN (N/A Spine Lumbar)     Patient location during evaluation: PACU Anesthesia Type: General Level of consciousness: awake and alert Pain management: pain level controlled Vital Signs Assessment: post-procedure vital signs reviewed and stable Respiratory status: spontaneous breathing, nonlabored ventilation, respiratory function stable and patient connected to nasal cannula oxygen Cardiovascular status: blood pressure returned to baseline and stable Postop Assessment: no apparent nausea or vomiting Anesthetic complications: no    Last Vitals:  Vitals:   05/30/17 1531 05/30/17 1600  BP: (!) 106/58 (!) 90/45  Pulse: 73 77  Resp: 19 (!) 21  Temp:  36.7 C  SpO2: 99% 91%    Last Pain:  Vitals:   05/30/17 1800  TempSrc:   PainSc: 10-Worst pain ever                 Kahlil Cowans S

## 2017-05-31 LAB — CK: CK TOTAL: 78 U/L (ref 38–234)

## 2017-05-31 MED ORDER — SODIUM CHLORIDE 0.9% FLUSH
10.0000 mL | INTRAVENOUS | Status: DC | PRN
  Administered 2017-06-06 – 2017-06-13 (×3): 10 mL
  Filled 2017-05-31 (×3): qty 40

## 2017-05-31 MED ORDER — CHLORHEXIDINE GLUCONATE CLOTH 2 % EX PADS
6.0000 | MEDICATED_PAD | Freq: Every day | CUTANEOUS | Status: DC
  Administered 2017-05-31 – 2017-06-13 (×14): 6 via TOPICAL

## 2017-05-31 NOTE — Plan of Care (Signed)
Problem: Pain Managment: Goal: General experience of comfort will improve Outcome: Progressing Pt able to experience some pain relief from medications, repositioning, decreasing stimulation in environment, and therapeutic conversations.

## 2017-05-31 NOTE — Plan of Care (Signed)
Problem: Skin Integrity: Goal: Risk for impaired skin integrity will decrease Outcome: Not Progressing Patient continues to have leakage from lumbar drain site, MD aware honeycomb dressing changed as needed

## 2017-05-31 NOTE — Progress Notes (Signed)
Patient ID: [name], female   DOB: 1951-03-17, 66 y.o.   MRN: [id number] Subjective: Patient reports continued back pain  Objective: Vital signs in last 24 hours: Temp:  [97.7 F (36.5 C)-98.4 F (36.9 C)] 98 F (36.7 C) (10/13 0800) Pulse Rate:  [66-111] 81 (10/13 0900) Resp:  [11-26] 16 (10/13 0900) BP: (80-143)/(36-94) 124/70 (10/13 0900) SpO2:  [88 %-100 %] 99 % (10/13 0900)  Intake/Output from previous day: 10/12 0701 - 10/13 0700 In: 2470 [I.V.:2300; IV Piggyback:170] Out: 2699 [Urine:2525; Drains:174] Intake/Output this shift: Total I/O In: 200 [I.V.:200] Out: 5 [Drains:5]  Neurologic: Grossly normal, dressing dampened but no obvious gross leak, lumbar drain patent with 5 mL last hour  Lab Results: Lab Results  Component Value Date   WBC 6.2 05/26/2017   HGB 8.7 (L) 05/26/2017   HCT 28.0 (L) 05/26/2017   MCV 85.1 05/26/2017   PLT 225 05/26/2017   Lab Results  Component Value Date   INR 1.09 05/26/2017   BMET Lab Results  Component Value Date   NA 139 05/24/2017   K 2.9 (L) 05/24/2017   CL 103 05/24/2017   CO2 29 05/24/2017   GLUCOSE 126 (H) 05/24/2017   BUN <5 (L) 05/24/2017   CREATININE 0.80 05/24/2017   CALCIUM 8.1 (L) 05/26/2017    Studies/Results: No results found.  Assessment/Plan: Continue Flat bedrest, continue lumbar drain and about 10 mL/h as the goal   LOS: 5 days    [name] S 05/31/2017, 9:46 AM

## 2017-05-31 NOTE — Plan of Care (Signed)
Problem: Bowel/Gastric: Goal: Will not experience complications related to bowel motility Outcome: Not Progressing Patient has not had BM since 10/6, receiving multiple pain meds. Daily prn dulcolax given. Continuing to monitor

## 2017-06-01 ENCOUNTER — Encounter (HOSPITAL_COMMUNITY): Payer: Self-pay | Admitting: Anesthesiology

## 2017-06-01 ENCOUNTER — Ambulatory Visit: Payer: Self-pay | Admitting: Plastic Surgery

## 2017-06-01 DIAGNOSIS — S21209A Unspecified open wound of unspecified back wall of thorax without penetration into thoracic cavity, initial encounter: Secondary | ICD-10-CM

## 2017-06-01 LAB — CBC
HEMATOCRIT: 31.2 % — AB (ref 36.0–46.0)
HEMOGLOBIN: 9.7 g/dL — AB (ref 12.0–15.0)
MCH: 26.7 pg (ref 26.0–34.0)
MCHC: 31.1 g/dL (ref 30.0–36.0)
MCV: 86 fL (ref 78.0–100.0)
Platelets: 256 10*3/uL (ref 150–400)
RBC: 3.63 MIL/uL — AB (ref 3.87–5.11)
RDW: 14.7 % (ref 11.5–15.5)
WBC: 9.1 10*3/uL (ref 4.0–10.5)

## 2017-06-01 LAB — BASIC METABOLIC PANEL
ANION GAP: 8 (ref 5–15)
BUN: <5 mg/dL — ABNORMAL LOW (ref 6–20)
CHLORIDE: 101 mmol/L (ref 101–111)
CO2: 29 mmol/L (ref 22–32)
Calcium: 8.4 mg/dL — ABNORMAL LOW (ref 8.9–10.3)
Creatinine, Ser: 1.08 mg/dL — ABNORMAL HIGH (ref 0.44–1.00)
GFR calc Af Amer: 60 mL/min — ABNORMAL LOW (ref >60)
GFR calc non Af Amer: 52 mL/min — ABNORMAL LOW (ref >60)
Glucose, Bld: 97 mg/dL (ref 65–99)
POTASSIUM: 4.3 mmol/L (ref 3.5–5.1)
SODIUM: 138 mmol/L (ref 135–145)

## 2017-06-01 LAB — TYPE AND SCREEN
ABO/RH(D): B POS
Antibody Screen: NEGATIVE
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
## 2017-06-01 NOTE — Progress Notes (Signed)
Patient ID: [name], female   DOB: Aug 29, 1950, 67 y.o.   MRN: [id number] No change, LD patent, to OR tomorrow for attempted repair

## 2017-06-01 NOTE — Plan of Care (Signed)
Problem: Bowel/Gastric: Goal: Will not experience complications related to bowel motility Outcome: Not Progressing Sent message to neurosurgeon on call, who does not wish to have further intervention at this time. Scheduled and PRN stool softeners given. Active bowel sounds and no complaints of pain or discomfort from the patient.

## 2017-06-01 NOTE — Anesthesia Preprocedure Evaluation (Addendum)
Anesthesia Evaluation  Patient identified by MRN, date of birth, ID band Patient awake    Reviewed: Allergy & Precautions, NPO status , Patient's Chart, lab work & pertinent test results, reviewed documented beta blocker date and time   History of Anesthesia Complications (+) history of anesthetic complications  Airway Mallampati: III  TM Distance: >3 FB Neck ROM: Full    Dental no notable dental hx. (+) Teeth Intact   Pulmonary neg pulmonary ROS,    Pulmonary exam normal breath sounds clear to auscultation       Cardiovascular hypertension, Pt. on medications and Pt. on home beta blockers +CHF  Normal cardiovascular exam Rhythm:Regular Rate:Normal     Neuro/Psych Anxiety negative neurological ROS     GI/Hepatic Neg liver ROS, GERD  Medicated and Controlled,  Endo/Other  Morbid obesityHyperlipidemia   Renal/GU Renal InsufficiencyRenal disease     Musculoskeletal  (+) Arthritis , Osteoarthritis,  Hx/o epidural abscess postop CSF leak s/P spinal fusion Chronic back pain   Abdominal (+) + obese,   Peds  Hematology  (+) anemia ,   Anesthesia Other Findings   Reproductive/Obstetrics HSV                             Anesthesia Physical Anesthesia Plan  ASA: III  Anesthesia Plan: General   Post-op Pain Management:    Induction: Intravenous  PONV Risk Score and Plan: 4 or greater and Ondansetron, Dexamethasone, Midazolam, Propofol infusion, Metaclopromide and Treatment may vary due to age or medical condition  Airway Management Planned: Oral ETT  Additional Equipment:   Intra-op Plan:   Post-operative Plan: Extubation in OR  Informed Consent: I have reviewed the patients History and Physical, chart, labs and discussed the procedure including the risks, benefits and alternatives for the proposed anesthesia with the patient or authorized representative who has indicated his/her  understanding and acceptance.   Dental advisory given  Plan Discussed with: CRNA, Anesthesiologist and Surgeon  Anesthesia Plan Comments:        Anesthesia Quick Evaluation

## 2017-06-02 ENCOUNTER — Encounter (HOSPITAL_COMMUNITY): Payer: Self-pay | Admitting: Plastic Surgery

## 2017-06-02 ENCOUNTER — Encounter (HOSPITAL_COMMUNITY)
Admission: AD | Disposition: A | Discharge status: Home Health | Payer: Self-pay | Source: Ambulatory Visit | Attending: Neurological Surgery

## 2017-06-02 ENCOUNTER — Inpatient Hospital Stay (HOSPITAL_COMMUNITY): Payer: Medicare Other | Admitting: Anesthesiology

## 2017-06-02 DIAGNOSIS — G970 Cerebrospinal fluid leak from spinal puncture: Secondary | ICD-10-CM | POA: Diagnosis not present

## 2017-06-02 HISTORY — PX: MUSCLE FLAP CLOSURE: SHX2054

## 2017-06-02 HISTORY — PX: APPLICATION OF WOUND VAC: SHX5189

## 2017-06-02 HISTORY — PX: LUMBAR WOUND DEBRIDEMENT: SHX1988

## 2017-06-02 HISTORY — PX: WOUND EXPLORATION: SHX6188

## 2017-06-02 SURGERY — LUMBAR WOUND DEBRIDEMENT
Anesthesia: General | Site: Back

## 2017-06-02 MED ORDER — ACETAMINOPHEN 650 MG RE SUPP
650.0000 mg | RECTAL | Status: DC | PRN
  Administered 2017-06-02: 650 mg via RECTAL
  Filled 2017-06-02: qty 1

## 2017-06-02 MED ORDER — METOCLOPRAMIDE HCL 5 MG/ML IJ SOLN: 10.0000 mg | Freq: Once | INTRAMUSCULAR | Status: DC | PRN

## 2017-06-02 MED ORDER — DIPHENHYDRAMINE HCL 50 MG/ML IJ SOLN
INTRAMUSCULAR | Status: DC | PRN
  Administered 2017-06-02: 25 mg via INTRAVENOUS

## 2017-06-02 MED ORDER — HYDROMORPHONE HCL 1 MG/ML IJ SOLN
0.2500 mg | INTRAMUSCULAR | Status: DC | PRN
  Administered 2017-06-02 (×4): 0.5 mg via INTRAVENOUS

## 2017-06-02 MED ORDER — POLYETHYLENE GLYCOL 3350 17 G PO PACK
17.0000 g | PACK | Freq: Every day | ORAL | Status: DC | PRN
  Administered 2017-06-04: 17 g via ORAL
  Filled 2017-06-02: qty 1

## 2017-06-02 MED ORDER — HYDROMORPHONE HCL 1 MG/ML IJ SOLN
INTRAMUSCULAR | Status: AC
  Administered 2017-06-02: 0.5 mg via INTRAVENOUS
  Filled 2017-06-02: qty 1

## 2017-06-02 MED ORDER — PROPOFOL 10 MG/ML IV BOLUS
INTRAVENOUS | Status: AC
  Filled 2017-06-02: qty 20

## 2017-06-02 MED ORDER — METRONIDAZOLE IN NACL 5-0.79 MG/ML-% IV SOLN
500.0000 mg | INTRAVENOUS | Status: AC
  Administered 2017-06-02: 500 mg via INTRAVENOUS
  Filled 2017-06-02: qty 100

## 2017-06-02 MED ORDER — 0.9 % SODIUM CHLORIDE (POUR BTL) OPTIME
TOPICAL | Status: DC | PRN
  Administered 2017-06-02: 1000 mL

## 2017-06-02 MED ORDER — ACETAMINOPHEN 325 MG PO TABS
650.0000 mg | ORAL_TABLET | Freq: Four times a day (QID) | ORAL | Status: DC | PRN
  Administered 2017-06-03: 650 mg via ORAL
  Filled 2017-06-02 (×2): qty 2

## 2017-06-02 MED ORDER — PROPOFOL 10 MG/ML IV BOLUS
INTRAVENOUS | Status: DC | PRN
  Administered 2017-06-02: 100 mg via INTRAVENOUS

## 2017-06-02 MED ORDER — MEPERIDINE HCL 25 MG/ML IJ SOLN: 6.2500 mg | INTRAMUSCULAR | Status: DC | PRN

## 2017-06-02 MED ORDER — POLYETHYLENE GLYCOL 3350 17 G PO PACK: 17.0000 g | PACK | Freq: Every day | ORAL | Status: DC

## 2017-06-02 MED ORDER — EPHEDRINE SULFATE 50 MG/ML IJ SOLN
INTRAMUSCULAR | Status: DC | PRN
  Administered 2017-06-02: 5 mg via INTRAVENOUS

## 2017-06-02 MED ORDER — THROMBIN 20000 UNITS EX SOLR
OROMUCOSAL | Status: DC | PRN
  Administered 2017-06-02: 20 mL via TOPICAL

## 2017-06-02 MED ORDER — BACITRACIN 50000 UNITS IM SOLR
INTRAMUSCULAR | Status: DC | PRN
  Administered 2017-06-02: 500 mL

## 2017-06-02 MED ORDER — LACTATED RINGERS IV SOLN
INTRAVENOUS | Status: DC | PRN
  Administered 2017-06-02: 07:00:00 via INTRAVENOUS

## 2017-06-02 MED ORDER — THROMBIN 20000 UNITS EX KIT
PACK | CUTANEOUS | Status: AC
  Filled 2017-06-02: qty 1

## 2017-06-02 MED ORDER — SUGAMMADEX SODIUM 200 MG/2ML IV SOLN
INTRAVENOUS | Status: DC | PRN
  Administered 2017-06-02: 200 mg via INTRAVENOUS

## 2017-06-02 MED ORDER — FENTANYL CITRATE (PF) 250 MCG/5ML IJ SOLN
INTRAMUSCULAR | Status: DC | PRN
  Administered 2017-06-02: 50 ug via INTRAVENOUS
  Administered 2017-06-02: 100 ug via INTRAVENOUS
  Administered 2017-06-02 (×2): 50 ug via INTRAVENOUS

## 2017-06-02 MED ORDER — MIDAZOLAM HCL 2 MG/2ML IJ SOLN
INTRAMUSCULAR | Status: AC
  Filled 2017-06-02: qty 2

## 2017-06-02 MED ORDER — ROCURONIUM BROMIDE 100 MG/10ML IV SOLN
INTRAVENOUS | Status: DC | PRN
  Administered 2017-06-02: 50 mg via INTRAVENOUS

## 2017-06-02 MED ORDER — OXYCODONE HCL 5 MG PO TABS
ORAL_TABLET | ORAL | Status: AC
  Filled 2017-06-02: qty 2

## 2017-06-02 MED ORDER — PHENYLEPHRINE HCL 10 MG/ML IJ SOLN
INTRAMUSCULAR | Status: DC | PRN
  Administered 2017-06-02: 120 ug via INTRAVENOUS

## 2017-06-02 MED ORDER — PHENYLEPHRINE HCL 10 MG/ML IJ SOLN
INTRAMUSCULAR | Status: DC | PRN
  Administered 2017-06-02: 25 ug/min via INTRAVENOUS

## 2017-06-02 MED ORDER — SODIUM CHLORIDE 0.9 % IV SOLN
INTRAVENOUS | Status: DC | PRN
  Administered 2017-06-02: 500 mL

## 2017-06-02 MED ORDER — FENTANYL CITRATE (PF) 250 MCG/5ML IJ SOLN
INTRAMUSCULAR | Status: AC
  Filled 2017-06-02: qty 5

## 2017-06-02 MED ORDER — BUPIVACAINE HCL (PF) 0.25 % IJ SOLN
INTRAMUSCULAR | Status: AC
  Filled 2017-06-02: qty 30

## 2017-06-02 SURGICAL SUPPLY — 107 items
ADH SKN CLS APL DERMABOND .7 (GAUZE/BANDAGES/DRESSINGS) ×2
APL SKNCLS STERI-STRIP NONHPOA (GAUZE/BANDAGES/DRESSINGS) ×2
APPLIER CLIP 9.375 MED OPEN (MISCELLANEOUS)
APR CLP MED 9.3 20 MLT OPN (MISCELLANEOUS)
BAG DECANTER FOR FLEXI CONT (MISCELLANEOUS) ×8 IMPLANT
BENZOIN TINCTURE PRP APPL 2/3 (GAUZE/BANDAGES/DRESSINGS) ×4 IMPLANT
BINDER BREAST 3XL (GAUZE/BANDAGES/DRESSINGS) ×2 IMPLANT
BLADE 10 SAFETY STRL DISP (BLADE) ×2 IMPLANT
BLADE SURG 10 STRL SS (BLADE) ×2 IMPLANT
BLADE SURG 15 STRL LF DISP TIS (BLADE) ×2 IMPLANT
BLADE SURG 15 STRL SS (BLADE)
BNDG COHESIVE 4X5 TAN STRL (GAUZE/BANDAGES/DRESSINGS) IMPLANT
CANISTER SUCT 3000ML PPV (MISCELLANEOUS) ×10 IMPLANT
CANISTER WOUND CARE 500ML ATS (WOUND CARE) IMPLANT
CARTRIDGE OIL MAESTRO DRILL (MISCELLANEOUS) ×4 IMPLANT
CHLORAPREP W/TINT 26ML (MISCELLANEOUS) ×2 IMPLANT
CLIP APPLIE 9.375 MED OPEN (MISCELLANEOUS) ×2 IMPLANT
CLOSURE WOUND 1/2 X4 (GAUZE/BANDAGES/DRESSINGS)
COVER SURGICAL LIGHT HANDLE (MISCELLANEOUS) ×2 IMPLANT
DERMABOND ADVANCED (GAUZE/BANDAGES/DRESSINGS) ×2
DERMABOND ADVANCED .7 DNX12 (GAUZE/BANDAGES/DRESSINGS) ×2 IMPLANT
DIFFUSER DRILL AIR PNEUMATIC (MISCELLANEOUS) ×4 IMPLANT
DRAIN CHANNEL 19F RND (DRAIN) ×4 IMPLANT
DRAPE HALF SHEET 40X57 (DRAPES) ×8 IMPLANT
DRAPE INCISE 23X17 IOBAN STRL (DRAPES)
DRAPE INCISE 23X17 STRL (DRAPES) IMPLANT
DRAPE INCISE IOBAN 23X17 STRL (DRAPES) IMPLANT
DRAPE INCISE IOBAN 85X60 (DRAPES) IMPLANT
DRAPE LAPAROTOMY 100X72X124 (DRAPES) ×4 IMPLANT
DRAPE LAPAROTOMY T 102X78X121 (DRAPES) ×4 IMPLANT
DRAPE ORTHO SPLIT 77X108 STRL (DRAPES)
DRAPE POUCH INSTRU U-SHP 10X18 (DRAPES) ×4 IMPLANT
DRAPE SURG ORHT 6 SPLT 77X108 (DRAPES) ×4 IMPLANT
DRAPE WARM FLUID 44X44 (DRAPE) ×2 IMPLANT
DRESSING PREVENA PLUS CUSTOM (GAUZE/BANDAGES/DRESSINGS) IMPLANT
DRSG MEPILEX BORDER 4X8 (GAUZE/BANDAGES/DRESSINGS) ×2 IMPLANT
DRSG PAD ABDOMINAL 8X10 ST (GAUZE/BANDAGES/DRESSINGS) ×4 IMPLANT
DRSG PREVENA PLUS CUSTOM (GAUZE/BANDAGES/DRESSINGS) ×4
DRSG VAC ATS MED SENSATRAC (GAUZE/BANDAGES/DRESSINGS) IMPLANT
DURAPREP 26ML APPLICATOR (WOUND CARE) IMPLANT
DURAPREP 6ML APPLICATOR 50/CS (WOUND CARE) IMPLANT
ELECT BLADE 6.5 EXT (BLADE) ×4 IMPLANT
ELECT CAUTERY BLADE 6.4 (BLADE) ×8 IMPLANT
ELECT REM PT RETURN 9FT ADLT (ELECTROSURGICAL) ×4
ELECTRODE REM PT RTRN 9FT ADLT (ELECTROSURGICAL) ×4 IMPLANT
EVACUATOR SILICONE 100CC (DRAIN) ×4 IMPLANT
GAUZE SPONGE 4X4 12PLY STRL (GAUZE/BANDAGES/DRESSINGS) ×4 IMPLANT
GAUZE SPONGE 4X4 16PLY XRAY LF (GAUZE/BANDAGES/DRESSINGS) ×2 IMPLANT
GAUZE XEROFORM 5X9 LF (GAUZE/BANDAGES/DRESSINGS) ×2 IMPLANT
GLOVE BIO SURGEON STRL SZ 6.5 (GLOVE) ×6 IMPLANT
GLOVE BIO SURGEON STRL SZ7 (GLOVE) IMPLANT
GLOVE BIO SURGEON STRL SZ8 (GLOVE) ×8 IMPLANT
GLOVE BIO SURGEONS STRL SZ 6.5 (GLOVE) ×2
GLOVE BIOGEL PI IND STRL 7.0 (GLOVE) IMPLANT
GLOVE BIOGEL PI INDICATOR 7.0 (GLOVE)
GOWN STRL REUS W/ TWL LRG LVL3 (GOWN DISPOSABLE) ×8 IMPLANT
GOWN STRL REUS W/ TWL XL LVL3 (GOWN DISPOSABLE) IMPLANT
GOWN STRL REUS W/TWL 2XL LVL3 (GOWN DISPOSABLE) ×4 IMPLANT
GOWN STRL REUS W/TWL LRG LVL3 (GOWN DISPOSABLE) ×16
GOWN STRL REUS W/TWL XL LVL3 (GOWN DISPOSABLE)
KIT BASIN OR (CUSTOM PROCEDURE TRAY) ×8 IMPLANT
KIT ROOM TURNOVER OR (KITS) ×4 IMPLANT
NDL HYPO 18GX1.5 BLUNT FILL (NEEDLE) IMPLANT
NDL HYPO 25X1 1.5 SAFETY (NEEDLE) ×2 IMPLANT
NDL SPNL 20GX3.5 QUINCKE YW (NEEDLE) IMPLANT
NEEDLE HYPO 18GX1.5 BLUNT FILL (NEEDLE) IMPLANT
NEEDLE HYPO 25X1 1.5 SAFETY (NEEDLE) IMPLANT
NEEDLE SPNL 20GX3.5 QUINCKE YW (NEEDLE) IMPLANT
NS IRRIG 1000ML POUR BTL (IV SOLUTION) ×10 IMPLANT
OIL CARTRIDGE MAESTRO DRILL (MISCELLANEOUS)
PACK GENERAL/GYN (CUSTOM PROCEDURE TRAY) ×2 IMPLANT
PACK LAMINECTOMY NEURO (CUSTOM PROCEDURE TRAY) ×6 IMPLANT
PAD ARMBOARD 7.5X6 YLW CONV (MISCELLANEOUS) ×24 IMPLANT
PENCIL BUTTON HOLSTER BLD 10FT (ELECTRODE) ×2 IMPLANT
SET ADHESIVE SKIN CLSR ABRA (MISCELLANEOUS) ×10 IMPLANT
SET COLLECT BLD 21X3/4 12 PB (MISCELLANEOUS) IMPLANT
SPONGE LAP 18X18 X RAY DECT (DISPOSABLE) ×4 IMPLANT
STAPLER VISISTAT 35W (STAPLE) ×4 IMPLANT
STOCKINETTE IMPERVIOUS 9X36 MD (GAUZE/BANDAGES/DRESSINGS) IMPLANT
STOPCOCK 4 WAY LG BORE MALE ST (IV SETS) IMPLANT
STRIP CLOSURE SKIN 1/2X4 (GAUZE/BANDAGES/DRESSINGS) ×6 IMPLANT
SUT ETHILON 2 0 FS 18 (SUTURE) ×4 IMPLANT
SUT ETHILON 3 0 PS 1 (SUTURE) ×2 IMPLANT
SUT MNCRL AB 3-0 PS2 18 (SUTURE) ×12 IMPLANT
SUT MNCRL AB 4-0 PS2 18 (SUTURE) ×10 IMPLANT
SUT MON AB 2-0 CT1 36 (SUTURE) ×8 IMPLANT
SUT MON AB 5-0 PS2 18 (SUTURE) ×4 IMPLANT
SUT PDS AB 2-0 CT1 27 (SUTURE) ×18 IMPLANT
SUT VIC AB 0 CT1 18XCR BRD8 (SUTURE) ×2 IMPLANT
SUT VIC AB 0 CT1 8-18 (SUTURE)
SUT VIC AB 2-0 CP2 18 (SUTURE) ×2 IMPLANT
SUT VIC AB 3-0 PS2 18 (SUTURE)
SUT VIC AB 3-0 PS2 18XBRD (SUTURE) ×8 IMPLANT
SUT VIC AB 3-0 SH 18 (SUTURE) ×2 IMPLANT
SUT VIC AB 3-0 SH 8-18 (SUTURE) ×2 IMPLANT
SWAB COLLECTION DEVICE MRSA (MISCELLANEOUS) ×2 IMPLANT
SWAB CULTURE ESWAB REG 1ML (MISCELLANEOUS) ×2 IMPLANT
SYR 3ML LL SCALE MARK (SYRINGE) IMPLANT
SYR 50ML SLIP (SYRINGE) IMPLANT
SYR BULB IRRIGATION 50ML (SYRINGE) ×4 IMPLANT
TOWEL GREEN STERILE (TOWEL DISPOSABLE) ×6 IMPLANT
TOWEL GREEN STERILE FF (TOWEL DISPOSABLE) ×6 IMPLANT
TOWEL OR 17X24 6PK STRL BLUE (TOWEL DISPOSABLE) ×2 IMPLANT
TOWEL OR 17X26 10 PK STRL BLUE (TOWEL DISPOSABLE) ×2 IMPLANT
TRAY FOLEY W/METER SILVER 14FR (SET/KITS/TRAYS/PACK) ×2 IMPLANT
WATER STERILE IRR 1000ML POUR (IV SOLUTION) ×6 IMPLANT
WND VAC CANISTER 500ML (MISCELLANEOUS) ×2 IMPLANT

## 2017-06-02 NOTE — Op Note (Signed)
05/26/2017 - 06/02/2017  11:02 AM  PATIENT:  [name]  66 y.o. 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 Tito Dine  ANESTHESIA:   General  EBL: 50 ml  Total I/O In: 800 [I.V.:800] Out: 80 [Blood:50]  BLOOD ADMINISTERED: none  DRAINS: none  SPECIMEN:  none  INDICATION FOR PROCEDURE: This patient presented with  Continued drainage from her lumbar wound concerning for CSF leak despite placement of a lumbar drain. Recommended  Lumbar re-exploration for CSF leak with complex wound closure with Plastic surgery. Patient understood the risks, benefits, and alternatives and potential outcomes and wished to proceed.  PROCEDURE DETAILS:  The patient was taken to the operating room and after induction of adequate generalized endotracheal anesthesia she was rolled into the prone position  On chest rolls andall pressure  Points were padded.  Her lumbar region was cleaned with Hibiclens and then draped in usual sterile fashion.  We opened her old incision and removed all of the suture that we could fine.  We debrided the tissues.  We opened the fascia and identified the epidural space.  We identified the lumbar drain.  The tissues were further debrided with suction and with sharp debridement.  We did not see evidence of CSF leak despite Valsalva maneuver.  We irrigated with copious amounts of bacitracin containing saline solution.  We further debrided the fascia and the subcutaneous tissues.  We released the fascia for a fascial advancement.  We debrided back to good healthy year looking  bleeding tissue.  We lined the dura with Gel-Foam.  I then closed the muscle with interrupted Prolene.  We closed the fascia with interrupted 2 0 Prolene and Monocryl.  We closed the subcutaneous tissues with 2 0 and 3 0 Prolene and Monocryl.  The skin was closed with 4 0 Monocryl.  A pravaden  VAC was then placed and abra  Straps were used.  The lumbar drain was repositioned   Where it exited the skin.   The patient was then rolled into the supine position and awakened from general anesthesia and transported to the recovery room in stable condition.  At the end of the procedure all sponge needle and instrument counts were correct.   PLAN OF CARE: Admit to inpatient   PATIENT DISPOSITION:  PACU - hemodynamically stable.   Delay start of Pharmacological VTE agent (>24hrs) due to surgical blood loss or risk of bleeding:  yes

## 2017-06-02 NOTE — Interval H&P Note (Signed)
History and Physical Interval Note:  06/02/2017 7:17 AM  [name]  has presented today for surgery, with the diagnosis of CSF Leak  The various methods of treatment have been discussed with the patient and family. After consideration of risks, benefits and other options for treatment, the patient has consented to  Procedure(s) with comments: Complex wound revision (N/A) - Complex wound revision APPLICATION OF WOUND VAC (N/A) EXPLORATION OF BACK WOUND (N/A) POSSIBLE MUSCLE FLAP (N/A) POSSIBLE APPLICATION OF WOUND VAC (N/A) as a surgical intervention .  The patient's history has been reviewed, patient examined, no change in status, stable for surgery.  I have reviewed the patient's chart and labs.  Questions were answered to the patient's satisfaction.     Wallace Going

## 2017-06-02 NOTE — Anesthesia Procedure Notes (Signed)
Procedure Name: Intubation Date/Time: 06/02/2017 8:00 AM Performed by: Mariea Clonts Pre-anesthesia Checklist: Patient identified, Emergency Drugs available, Suction available and Patient being monitored Patient Re-evaluated:Patient Re-evaluated prior to induction Oxygen Delivery Method: Circle System Utilized Preoxygenation: Pre-oxygenation with 100% oxygen Induction Type: IV induction Ventilation: Mask ventilation without difficulty Laryngoscope Size: Miller and 2 Grade View: Grade I Tube type: Oral Tube size: 7.5 mm Number of attempts: 1 Airway Equipment and Method: Stylet and Oral airway Placement Confirmation: ETT inserted through vocal cords under direct vision,  positive ETCO2 and breath sounds checked- equal and bilateral Tube secured with: Tape Dental Injury: Teeth and Oropharynx as per pre-operative assessment

## 2017-06-02 NOTE — Op Note (Signed)
05/26/2017 - 05/28/2017  12:12 PM  PATIENT:  [name]  66 y.o. female  PRE-OPERATIVE DIAGNOSIS:  Post operative cerebrospinal fluid leak  POST-OPERATIVE DIAGNOSIS:  Same  PROCEDURE:  Exploration of lumbar wound, repair of CSF leak, placement of lumbar drain  SURGEON:  Aldean Ast, MD  ASSISTANTS: None  ANESTHESIA:   General  DRAINS: Lumbar drain   SPECIMEN:  None  INDICATION FOR PROCEDURE: 66 year old woman with post-operative CSF leak.  I recommended the above procedure.  The patient understood the risks, benefits, and alternatives and potential outcomes and wished to proceed.  PROCEDURE DETAILS: After smooth induction of general endotracheal anesthesia the patient was positioned prone on a Wilson frame.  The skin was wiped down with alcohol.  Sutures were removed.  The patient was prepped and draped in the usual sterile fashion.  The incision was re-opened by cutting the vicryl sutures at each layer.  These were then removed from the wound.  Self retaining retractors were placed.  There was no active CSF leak and it appeared that the Adherus was still in appropriate position.  The adherus was removed.  There was CSF leaking from the left greater than right side of the dura at L4-5.  The dura was punctured with a tuohy needle at approximately L2-3.  The lumbar drain was passed to a 10 cm.  I again placed dural substitute over the L4-5 durotomies.  These and the drain exit site were sealed with Adherus.  I irrigated vigorously with bacitracin saline.    The drain was tunneled to a remote exit site.  It was secured with a nylon purse string suture.  The wound was closed in multiple layers with vicryl sutures.  The skin was closed with a running vertical mattress prolene suture.  A sterile dressing was applied.  The lumbar drain was connected to the drainage cannister.  The patient was returned to the supine position and awoke without difficulty.  PATIENT DISPOSITION:  ICU  - extubated and stable.   Delay start of Pharmacological VTE agent (>24hrs) due to surgical blood loss or risk of bleeding:  yes

## 2017-06-02 NOTE — Transfer of Care (Signed)
Immediate Anesthesia Transfer of Care Note  Patient: [name]  Procedure(s) Performed: Complex wound revision (N/A Back) APPLICATION OF WOUND VAC (N/A Back) EXPLORATION OF BACK WOUND (N/A ) POSSIBLE MUSCLE FLAP (N/A ) POSSIBLE APPLICATION OF WOUND VAC (N/A )  Patient Location: PACU  Anesthesia Type:General  Level of Consciousness: awake, alert  and oriented  Airway & Oxygen Therapy: Patient Spontanous Breathing and Patient connected to nasal cannula oxygen  Post-op Assessment: Report given to RN, Post -op Vital signs reviewed and stable and Patient moving all extremities X 4  Post vital signs: Reviewed and stable  Last Vitals:  Vitals:   06/02/17 0600 06/02/17 1015  BP: (!) 131/59 (!) 148/119  Pulse: 86 (!) 106  Resp: (!) 24 18  Temp:  36.9 C  SpO2: 100% 95%    Last Pain:  Vitals:   06/02/17 1015  TempSrc:   PainSc: 10-Worst pain ever      Patients Stated Pain Goal:  (given by CRNA) (57/89/78 [id number])  Complications: No apparent anesthesia complications

## 2017-06-02 NOTE — Anesthesia Postprocedure Evaluation (Signed)
Anesthesia Post Note  Patient: [name]  Procedure(s) Performed: Complex wound revision (N/A Back) APPLICATION OF WOUND VAC (N/A Back) EXPLORATION OF BACK WOUND (N/A ) POSSIBLE MUSCLE FLAP (N/A ) POSSIBLE APPLICATION OF WOUND VAC (N/A )     Patient location during evaluation: PACU Anesthesia Type: General Level of consciousness: awake and alert and oriented Pain management: pain level controlled Vital Signs Assessment: post-procedure vital signs reviewed and stable Respiratory status: spontaneous breathing, nonlabored ventilation, respiratory function stable and patient connected to nasal cannula oxygen Cardiovascular status: blood pressure returned to baseline and stable Postop Assessment: no apparent nausea or vomiting Anesthetic complications: no    Last Vitals:  Vitals:   06/02/17 1100 06/02/17 1140  BP:    Pulse: 93   Resp: 13   Temp:  36.7 C  SpO2: 100%     Last Pain:  Vitals:   06/02/17 1140  TempSrc: Axillary  PainSc:                  Lucee Brissett A.

## 2017-06-02 NOTE — Op Note (Signed)
DATE OF OPERATION: 06/02/2017  LOCATION: [facility] Operating Room  PREOPERATIVE DIAGNOSIS: spinal fluid leak / back wound  POSTOPERATIVE DIAGNOSIS: Same  PROCEDURE: Complex closure of spinal wound with fascial advancement and VAC placement  SURGEON: Dr. Sherley Bounds  CO-SURGEON: Lyndee Leo Sanger Dillingham, DO  EBL: 50 cc  CONDITION: Stable  COMPLICATIONS: None  INDICATION: The patient, [name], is a 66 y.o. female born on [birth year], is here for treatment of a spinal fluid leak with resulting wound.   PROCEDURE DETAILS:  The patient was seen prior to surgery and marked.  The IV antibiotics were given. The patient was taken to the operating room and given a general anesthetic. A standard time out was performed and all information was confirmed by those in the room. SCDs were placed.   I assisted and worked with Dr. Ronnald Ramp to perform a complex closure of the spinal wound.  The fascia was released over the muscle on each side of the spine for 20 cm.  This was done to advance the muscle and decrease the tension over the spine.  This was closed with figure of 8 sutures of 2-0 PDS.  The deep layers were then closed with the 3-0 Monocryl and the dermal and subcuticular with the 4-0 Monocryl and 5-0 Monocryl.  The incisional vac was applied and there was an excellent seal.  In an attempt to decrease the tension on the incision site, the ABRA was applied over the VAC.  The patient was allowed to wake up and taken to recovery room in stable condition at the end of the case. The family was notified at the end of the case.
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
## 2017-06-03 ENCOUNTER — Encounter (HOSPITAL_COMMUNITY): Payer: Self-pay | Admitting: Neurological Surgery

## 2017-06-03 MED ORDER — BISACODYL 5 MG PO TBEC
10.0000 mg | DELAYED_RELEASE_TABLET | Freq: Every day | ORAL | Status: DC | PRN
  Administered 2017-06-04: 10 mg via ORAL
  Filled 2017-06-03: qty 2

## 2017-06-03 MED ORDER — BISACODYL 10 MG RE SUPP
10.0000 mg | Freq: Every day | RECTAL | Status: DC | PRN
  Administered 2017-06-04: 10 mg via RECTAL
  Filled 2017-06-03: qty 1

## 2017-06-03 MED FILL — Thrombin For Soln Kit 20000 Unit: CUTANEOUS | Qty: 1 | Status: AC

## 2017-06-03 NOTE — Progress Notes (Signed)
Subjective: Patient reports moderate back and buttocks pain.   Objective: Vital signs in last 24 hours: Temp:  [98 F (36.7 C)-102.2 F (39 C)] 100 F (37.8 C) (10/16 0400) Pulse Rate:  [88-136] 97 (10/16 0600) Resp:  [13-30] 22 (10/16 0600) BP: (93-194)/(34-156) 95/46 (10/16 0600) SpO2:  [95 %-100 %] 97 % (10/16 0600)  Intake/Output from previous day: 10/15 0701 - 10/16 0700 In: 3370 [I.V.:3200; IV Piggyback:170] Out: 578 [Urine:500; Drains:28; Blood:50] Intake/Output this shift: No intake/output data recorded.  Neurologic: Grossly normal  Lab Results: Lab Results  Component Value Date   WBC 9.1 06/01/2017   HGB 9.7 (L) 06/01/2017   HCT 31.2 (L) 06/01/2017   MCV 86.0 06/01/2017   PLT 256 06/01/2017   Lab Results  Component Value Date   INR 1.09 05/26/2017   BMET Lab Results  Component Value Date   NA 138 06/01/2017   K 4.3 06/01/2017   CL 101 06/01/2017   CO2 29 06/01/2017   GLUCOSE 97 06/01/2017   BUN <5 (L) 06/01/2017   CREATININE 1.08 (H) 06/01/2017   CALCIUM 8.4 (L) 06/01/2017    Studies/Results: No results found.  Assessment/Plan: Incision CDI. Wound vac intact. Limitor drained 15 cc overnight. Possibly D/C drain tomorrow   LOS: 8 days    Barlow 06/03/2017, 8:03 AM

## 2017-06-03 NOTE — Op Note (Signed)
[place] with Dr. Ronnald Ramp

## 2017-06-04 NOTE — Plan of Care (Signed)
Problem: Nutrition: Goal: Adequate nutrition will be maintained Outcome: Progressing Pt able to tolerate PO liquid diet without difficulties.

## 2017-06-04 NOTE — Progress Notes (Signed)
NEUROSURGERY PROGRESS NOTE  Doing well. Complains of appropriate back soreness. Wound vac draining, serosanginous Incision CDI  Temp:  [98.2 F (36.8 C)-101.6 F (38.7 C)] 98.7 F (37.1 C) (10/17 0400) Pulse Rate:  [72-104] 88 (10/17 0700) Resp:  [18-31] 21 (10/17 0700) BP: (90-146)/(39-96) 142/96 (10/17 0700) SpO2:  [92 %-100 %] 100 % (10/17 0700)  Plan: Wound vac tightened. No new nsg recom.  Eleonore Chiquito, NP 06/04/2017 8:39 AM

## 2017-06-05 LAB — BASIC METABOLIC PANEL
ANION GAP: 8 (ref 5–15)
BUN: <5 mg/dL — ABNORMAL LOW (ref 6–20)
CHLORIDE: 102 mmol/L (ref 101–111)
CO2: 27 mmol/L (ref 22–32)
Calcium: 8.2 mg/dL — ABNORMAL LOW (ref 8.9–10.3)
Creatinine, Ser: 0.91 mg/dL (ref 0.44–1.00)
GFR calc non Af Amer: >60 mL/min (ref >60)
Glucose, Bld: 160 mg/dL — ABNORMAL HIGH (ref 65–99)
POTASSIUM: 4.1 mmol/L (ref 3.5–5.1)
SODIUM: 137 mmol/L (ref 135–145)

## 2017-06-05 MED ORDER — ENOXAPARIN SODIUM 60 MG/0.6ML ~~LOC~~ SOLN
60.0000 mg | SUBCUTANEOUS | Status: DC
  Administered 2017-06-05 – 2017-06-10 (×6): 60 mg via SUBCUTANEOUS
  Filled 2017-06-05 (×7): qty 0.6

## 2017-06-05 MED ORDER — KETOROLAC TROMETHAMINE 30 MG/ML IJ SOLN
30.0000 mg | Freq: Once | INTRAMUSCULAR | Status: AC
  Administered 2017-06-05: 30 mg via INTRAVENOUS
  Filled 2017-06-05: qty 1

## 2017-06-05 NOTE — Progress Notes (Signed)
[place] for enoxparin Indication: VTE prophylaxis   Assessment: 57 yof admitted with CSF leak after multilevel lumbar laminectomy, now s/p complex wound revision and lumbar drain placed 10/15 - to be removed 10/18. Pharmacy consulted by Neurosurgery to start enoxaparin for VTE prophylaxis. SCr stable 0.91. Hg 9.7, plt 256 on 10/14. No bleed documented. Previously on SCDs this admit.  Goal of Therapy:  Prevention of VTE Monitor platelets by anticoagulation protocol: Yes   Plan:  Enoxaparin 60mg  (~0.5mg /kg) [place] q24h for BMI>30 Monitor CBC at least q72h, renal function, s/sx bleeding  Elicia Lamp, PharmD, BCPS Clinical Pharmacist 06/05/2017 1:09 PM

## 2017-06-05 NOTE — Progress Notes (Signed)
Paged Margo Aye, NP with NS regarding patient's intractable pain.  Patient given Soma and 1mg  Dilaudid at 1330, 1mg  dilaudid and 5mg  valium at 1513 and 10mg  Oxy IR at 1606.  Patient is still in severe pain with constant moaning. Kim responded to page stating she is finishing up surgery and will come to the unit when finished. [place], [place]

## 2017-06-05 NOTE — Progress Notes (Signed)
Patient ID: [name], female   DOB: 08/10/1951, 66 y.o.   MRN: [id number] Subjective: Patient reports no headache, pain well controlled except when rolling  Objective: Vital signs in last 24 hours: Temp:  [98 F (36.7 C)-98.3 F (36.8 C)] 98 F (36.7 C) (10/18 0800) Pulse Rate:  [65-94] 85 (10/18 1100) Resp:  [11-27] 12 (10/18 1100) BP: (89-141)/(44-113) 135/85 (10/18 1100) SpO2:  [96 %-100 %] 100 % (10/18 1100)  Intake/Output from previous day: 10/17 0701 - 10/18 0700 In: 2520 [I.V.:2400; IV Piggyback:120] Out: 2394 [Urine:2200; Drains:194] Intake/Output this shift: Total I/O In: 400 [I.V.:400] Out: 0   Neurologic: Grossly normal  Lab Results: Lab Results  Component Value Date   WBC 9.1 06/01/2017   HGB 9.7 (L) 06/01/2017   HCT 31.2 (L) 06/01/2017   MCV 86.0 06/01/2017   PLT 256 06/01/2017   Lab Results  Component Value Date   INR 1.09 05/26/2017   BMET Lab Results  Component Value Date   NA 137 06/05/2017   K 4.1 06/05/2017   CL 102 06/05/2017   CO2 27 06/05/2017   GLUCOSE 160 (H) 06/05/2017   BUN <5 (L) 06/05/2017   CREATININE 0.91 06/05/2017   CALCIUM 8.2 (L) 06/05/2017    Studies/Results: No results found.  Assessment/Plan: Continue wound care, will remove LD today and keep flat until tomorrow, and start lovenox   LOS: 10 days    Buddy Loeffelholz S 06/05/2017, 12:16 PM

## 2017-06-06 LAB — CBC
HEMATOCRIT: 28.9 % — AB (ref 36.0–46.0)
Hemoglobin: 8.8 g/dL — ABNORMAL LOW (ref 12.0–15.0)
MCH: 25.8 pg — ABNORMAL LOW (ref 26.0–34.0)
MCHC: 30.4 g/dL (ref 30.0–36.0)
MCV: 84.8 fL (ref 78.0–100.0)
PLATELETS: 254 10*3/uL (ref 150–400)
RBC: 3.41 MIL/uL — ABNORMAL LOW (ref 3.87–5.11)
RDW: 14.7 % (ref 11.5–15.5)
WBC: 6.4 10*3/uL (ref 4.0–10.5)

## 2017-06-06 NOTE — Plan of Care (Signed)
Problem: Nutrition: Goal: Adequate nutrition will be maintained Outcome: Progressing Diet advanced to heart healthy diet.  Problem: Bowel/Gastric: Goal: Will not experience complications related to bowel motility Outcome: Progressing Pt had bowel movement on 10/18  Problem: Activity: Goal: Ability to avoid complications of mobility impairment will improve Outcome: Progressing Pt able to turn self in bed with minimal assistance.

## 2017-06-06 NOTE — Progress Notes (Signed)
NEUROSURGERY PROGRESS NOTE  Doing well. Complains of appropriate mild back soreness. Denies any headache. Wound vac d/c last night per plastics Incision- wet to dry dressing, serosanguinous.   Temp:  [98 F (36.7 C)-98.8 F (37.1 C)] 98.7 F (37.1 C) (10/19 0300) Pulse Rate:  [77-111] 81 (10/19 0600) Resp:  [11-28] 26 (10/19 0600) BP: (109-169)/(55-152) 118/65 (10/19 0600) SpO2:  [95 %-100 %] 96 % (10/19 0600)  Plan: No new nsgy recom.   Eleonore Chiquito, NP 06/06/2017 7:06 AM

## 2017-06-06 NOTE — Progress Notes (Signed)
Dressing changed. Will continue to monitor.

## 2017-06-06 NOTE — Progress Notes (Signed)
Pharmacy Antibiotic Note  Sherlynn A Bess is a 66 y.o. female admitted on 05/26/2017 with CSF leak with complex wound revision and lumbar drain.   Patient was started on daptomycin and ceftriaxone during last admission. Original plan (per Dr. Lucianne Lei Dam's note on 05/22/17) was for both of these antibiotics to continue through 07/14/2017.  CK on 10/13 was normal at 78.  SCr stable at 0.91.  Wound vac and lumbar drain were both removed yesterday.  Plan: Daptomycin 1g IV q24h Ceftriaxone 2g IV q24h Weekly CK on Saturdays Follow renal function, any changes to the above LOT  Height: 5\' 6"  (167.6 cm) Weight: 271 lb 2.7 oz (123 kg) IBW/kg (Calculated) : 59.3  Temp (24hrs), Avg:98.6 F (37 C), Min:98.2 F (36.8 C), Max:98.8 F (37.1 C)   Recent Labs Lab 06/01/17 0500 06/05/17 0921 06/06/17 0417  WBC 9.1  --  6.4  CREATININE 1.08* 0.91  --     Estimated Creatinine Clearance: 79.2 mL/min (by C-G formula based on SCr of 0.91 mg/dL).    Allergies  Allergen Reactions  . Vancomycin Other (See Comments)    Drug induced NEUTROPENIA  . Aspirin Other (See Comments)    Stomach bleeding  . Ibuprofen Other (See Comments)    Stomach bleeding  . Mushroom Extract Complex Hives and Itching  . Shellfish Allergy Hives and Itching  . Sulfa Antibiotics Hives and Itching  . Ciprofloxacin Other (See Comments)    Made "bottom" raw  . Metronidazole Other (See Comments)    Made "bottom" raw  . Tylenol [Acetaminophen] Itching  . Augmentin [Amoxicillin-Pot Clavulanate] Itching    Has never had problems with amoxicillin/ penicillin  HAS TOLERATED ZOSYN WITH ZERO PROBLEMS  . Betadine [Povidone Iodine] Itching  . Coconut Flavor Itching  . Codeine Itching  . Eggs Or Egg-Derived Products Nausea And Vomiting  . Iodine Itching  . Ivp Dye [Iodinated Diagnostic Agents] Hives    Takes Benadryl 50mg  PO before receiving iodinated contrast     Antimicrobials this admission: CTX 10/3>> Dapto  10/4>>  10/13 CK = 78  Thank you for allowing pharmacy to be a part of this patient's care.  Ghassan Coggeshall D. Larina Lieurance, PharmD, BCPS Clinical Pharmacist Pager: [phone] Clinical Phone for 06/06/2017 until 3:30pm: [phone] If after 3:30pm, please call main pharmacy at [phone] 06/06/2017 8:58 AM

## 2017-06-06 NOTE — Evaluation (Signed)
Physical Therapy Evaluation Patient Details Name: [name] MRN: [id number] DOB: [birth year] Today's Date: 06/06/2017   History of Present Illness  Pt admitted with post operative CSF leak with hospital course of comples wound revision including lumbar drain and wound vac. s/p lumbar surgery March 2018. PMH: CHF, chronic bronchitis, arthritis, anxiety, HTN, obesity.  Clinical Impression  Patient presents with decreased mobility due to prolonged bedrest with general weakness, decreased balance with high fall risk and poor activity tolerance.  Feel she will need skilled PT in the acute setting and follow up SNF level rehab at d/c.    Follow Up Recommendations SNF    Equipment Recommendations  None recommended by PT    Recommendations for Other Services       Precautions / Restrictions Precautions Precautions: Fall      Mobility  Bed Mobility Overal bed mobility: Needs Assistance Bed Mobility: Supine to Sit     Supine to sit: Mod assist     General bed mobility comments: assist to raise trunk and hips to EOB with bed pad  Transfers Overall transfer level: Needs assistance   Transfers: Sit to/from Stand;Stand Pivot Transfers Sit to Stand: +2 physical assistance;Mod assist Stand pivot transfers: +2 physical assistance;Mod assist       General transfer comment: some lifting help and assist to steady as pivoting, pt anxious and feeling unsteady  Ambulation/Gait                Stairs            Wheelchair Mobility    Modified Rankin (Stroke Patients Only)       Balance Overall balance assessment: Needs assistance   Sitting balance-Leahy Scale: Fair       Standing balance-Leahy Scale: Poor Standing balance comment: requires mod assist                             Pertinent Vitals/Pain Pain Assessment: No/denies pain    Home Living Family/patient expects to be discharged to:: Private residence Living Arrangements:  Non-relatives/Friends (roomate) Available Help at Discharge: Friend(s);Personal care attendant;Available 24 hours/day (aide 2 hours/7days a weel) Type of Home: Apartment Home Access: Level entry     Home Layout: One level Home Equipment: Shower seat;Grab bars - tub/shower;Walker - 2 wheels;Cane - single point;Bedside commode;Wheelchair - Psychologist, educational      Prior Function Level of Independence: Needs assistance   Gait / Transfers Assistance Needed: walks without a device, states walker was dangerous  ADL's / Homemaking Assistance Needed: aide performs cooking, cleaning, and assists with bathing and dressing        Hand Dominance   Dominant Hand: Right    Extremity/Trunk Assessment   Upper Extremity Assessment Upper Extremity Assessment: Defer to OT evaluation    Lower Extremity Assessment Lower Extremity Assessment: RLE deficits/detail;LLE deficits/detail RLE Deficits / Details: AROM WFL, strength grossly 3+ to 4/5 LLE Deficits / Details: AROM WFL, strength grossly 3+ to 4/5       Communication   Communication: No difficulties  Cognition Arousal/Alertness: Awake/alert Behavior During Therapy: Anxious Overall Cognitive Status: Impaired/Different from baseline Area of Impairment: Problem solving                             Problem Solving: Slow processing General Comments: some inconsistencies when offering home information      General Comments      Exercises  Assessment/Plan    PT Assessment Patient needs continued PT services  PT Problem List Decreased strength;Decreased mobility;Decreased activity tolerance;Decreased balance;Decreased knowledge of use of DME;Decreased safety awareness       PT Treatment Interventions DME instruction;Gait training;Functional mobility training;Balance training;Therapeutic exercise;Patient/family education;Therapeutic activities    PT Goals (Current goals can be found in the Care Plan section)   Acute Rehab PT Goals Patient Stated Goal: to return home PT Goal Formulation: With patient Time For Goal Achievement: 06/20/17 Potential to Achieve Goals: Good    Frequency Min 3X/week   Barriers to discharge        Co-evaluation PT/OT/SLP Co-Evaluation/Treatment: Yes Reason for Co-Treatment: For patient/therapist safety           AM-PAC PT "6 Clicks" Daily Activity  Outcome Measure Difficulty turning over in bed (including adjusting bedclothes, sheets and blankets)?: Unable Difficulty moving from lying on back to sitting on the side of the bed? : Unable Difficulty sitting down on and standing up from a chair with arms (e.g., wheelchair, bedside commode, etc,.)?: Unable Help needed moving to and from a bed to chair (including a wheelchair)?: A Lot Help needed walking in hospital room?: A Lot Help needed climbing 3-5 steps with a railing? : Total 6 Click Score: 8    End of Session Equipment Utilized During Treatment: Gait belt Activity Tolerance: Patient limited by fatigue Patient left: in chair Nurse Communication: Weight bearing status PT Visit Diagnosis: Unsteadiness on feet (R26.81);Muscle weakness (generalized) (M62.81);Other abnormalities of gait and mobility (R26.89);History of falling (Z91.81)    Time: 9753-0051 PT Time Calculation (min) (ACUTE ONLY): 34 min   Charges:   PT Evaluation $PT Eval Moderate Complexity: 1 Mod     PT G CodesMagda Dorsey, [place] [phone] 06/06/2017   [name] 06/06/2017, 7:31 PM

## 2017-06-06 NOTE — Progress Notes (Signed)
Dr. Ronnald Ramp aware pt has headache (rates 7/10 on pain scale) and serosanguinous drainage from mid/lower back incision site. No new orders at this time, will continue to closely monitor pt.

## 2017-06-06 NOTE — Evaluation (Signed)
Occupational Therapy Evaluation Patient Details Name: [name] MRN: [id number] DOB: 1951/05/18 Today's Date: 06/06/2017    History of Present Illness Pt admitted with post operative CSF leak with hospital course of comples wound revision including lumbar drain and wound vac. s/p lumbar surgery March 2018. PMH: CHF, chronic bronchitis, arthritis, anxiety, HTN, obesity.   Clinical Impression   Pt was assisted for bathing, dressing, meal prep and housekeeping prior to admission. She reports not using a device for mobility and walked short distances only. She states she thought using the walker was more dangerous. Pt presents with anxiety and fear of falling. She requires 2 person assist for OOB and mobility was limited to stand pivot transfers. Pt performs grooming with set up and UB ADL with min assist, but is dependent in toileting and LB ADL. Recommending continued therapy at SNF upon discharge. Will follow acutely.    Follow Up Recommendations  SNF;Supervision/Assistance - 24 hour    Equipment Recommendations       Recommendations for Other Services       Precautions / Restrictions Precautions Precautions: Fall      Mobility Bed Mobility Overal bed mobility: Needs Assistance Bed Mobility: Supine to Sit     Supine to sit: Mod assist     General bed mobility comments: assist to raise trunk and hips to EOB with bed pad  Transfers Overall transfer level: Needs assistance Equipment used: 2 person hand held assist Transfers: Sit to/from Stand;Stand Pivot Transfers Sit to Stand: +2 physical assistance;Mod assist Stand pivot transfers: +2 physical assistance;Mod assist       General transfer comment: assist to rise and steadying assist as she pivoted    Balance Overall balance assessment: Needs assistance   Sitting balance-Leahy Scale: Fair       Standing balance-Leahy Scale: Poor Standing balance comment: requires mod assist                            ADL either performed or assessed with clinical judgement   ADL Overall ADL's : Needs assistance/impaired Eating/Feeding: Independent;Bed level   Grooming: Set up;Sitting   Upper Body Bathing: Minimal assistance;Sitting   Lower Body Bathing: Total assistance;Sit to/from stand   Upper Body Dressing : Minimal assistance;Sitting   Lower Body Dressing: Total assistance;Sit to/from stand   Toilet Transfer: +2 for physical assistance;Minimal assistance;Stand-pivot;BSC   Toileting- Clothing Manipulation and Hygiene: Total assistance;+2 for physical assistance;Sit to/from stand         General ADL Comments: Pt fearful of moving/falling.     Vision Baseline Vision/History: Wears glasses Wears Glasses: Reading only Patient Visual Report: No change from baseline       Perception     Praxis      Pertinent Vitals/Pain Pain Assessment: No/denies pain Pain Score: 0-No pain     Hand Dominance Right   Extremity/Trunk Assessment Upper Extremity Assessment Upper Extremity Assessment: Generalized weakness   Lower Extremity Assessment Lower Extremity Assessment: Defer to PT evaluation   Cervical / Trunk Assessment Cervical / Trunk Assessment:  (wound, with drainage, RN aware)   Communication Communication Communication: No difficulties   Cognition Arousal/Alertness: Awake/alert Behavior During Therapy: Anxious Overall Cognitive Status: Impaired/Different from baseline Area of Impairment: Problem solving                             Problem Solving: Slow processing General Comments: some inconsistencies when offering home  information   General Comments       Exercises     Shoulder Instructions      Home Living Family/patient expects to be discharged to:: Private residence Living Arrangements: Non-relatives/Friends (roommate) Available Help at Discharge: Friend(s);Personal care attendant;Available 24 hours/day (aide 2 hours/7 days a week) Type of  Home: Apartment Home Access: Level entry     Home Layout: One level     Bathroom Shower/Tub: Occupational psychologist: Handicapped height     Home Equipment: Shower seat;Grab bars - tub/shower;Walker - 2 wheels;Cane - single point;Bedside commode;Wheelchair - Scientist, physiological: Reacher        Prior Functioning/Environment Level of Independence: Needs assistance  Gait / Transfers Assistance Needed: walks without a device, states walker was dangerous ADL's / Homemaking Assistance Needed: aide performs cooking, cleaning, and assists with bathing and dressing            OT Problem List: Decreased strength;Decreased activity tolerance;Impaired balance (sitting and/or standing);Decreased knowledge of use of DME or AE;Obesity      OT Treatment/Interventions: Self-care/ADL training;DME and/or AE instruction;Therapeutic activities;Patient/family education;Balance training    OT Goals(Current goals can be found in the care plan section) Acute Rehab OT Goals Patient Stated Goal: to return home OT Goal Formulation: With patient Time For Goal Achievement: 06/20/17 Potential to Achieve Goals: Good ADL Goals Pt Will Perform Grooming: with min guard assist;standing Pt Will Transfer to Toilet: with min assist;ambulating;bedside commode Pt Will Perform Toileting - Clothing Manipulation and hygiene: with min assist;sit to/from stand Additional ADL Goal #1: Pt will perform bed mobility with min guard assist.  OT Frequency: Min 2X/week   Barriers to D/C:            Co-evaluation PT/OT/SLP Co-Evaluation/Treatment: Yes Reason for Co-Treatment: For patient/therapist safety   OT goals addressed during session: ADL's and self-care      AM-PAC PT "6 Clicks" Daily Activity     Outcome Measure Help from another person eating meals?: None Help from another person taking care of personal grooming?: A Little Help from another person toileting, which  includes using toliet, bedpan, or urinal?: Total Help from another person bathing (including washing, rinsing, drying)?: A Lot Help from another person to put on and taking off regular upper body clothing?: A Little Help from another person to put on and taking off regular lower body clothing?: Total 6 Click Score: 14   End of Session Nurse Communication: Mobility status  Activity Tolerance: Patient tolerated treatment well (VSS) Patient left: in chair;with call bell/phone within reach  OT Visit Diagnosis: Unsteadiness on feet (R26.81);Muscle weakness (generalized) (M62.81)                Time: 9798-9211 OT Time Calculation (min): 34 min Charges:  OT General Charges $OT Visit: 1 Visit OT Evaluation $OT Eval Moderate Complexity: 1 Mod G-Codes:     June 15, 2017 Nestor Lewandowsky, OTR/L Pager: [phone]  Werner Lean, Haze Boyden Jun 15, 2017, 3:48 PM

## 2017-06-06 NOTE — Progress Notes (Signed)
RN noted a leak on the wound vac. Trying to trouble shoot, it was noted that the dressing to the wound was out. MD called to notify. Verbal order received to turn off the wound and to apply dry dressing with a silicon border.

## 2017-06-06 NOTE — Progress Notes (Signed)
4 Days Post-Op  Complex closure of spinal wound with fascial advancement and VAC placement  Subjective/Chief Complaint: Patients VAC dressing came off last evening.  The wound has serosanguinous drainage and is macerated about the borders. Difficult area to [facility] on this patient due to multiple skin folds, but feel it would help with edema and maceration of the peri wound area.   The peri wound was protected with duoderm border and the [facility] dressing was applied over adaptic to the incisional area only and therapy was resumed at 125 mmHg continuous therapy. This can hopefully be maintained through the weekend. She does not have a drain otherwise.    Objective: Vital signs in last 24 hours: Temp:  [98.2 F (36.8 C)-98.7 F (37.1 C)] 98.5 F (36.9 C) (10/19 1139) Pulse Rate:  [74-111] 82 (10/19 1400) Resp:  [13-27] 23 (10/19 1400) BP: (108-169)/(55-93) 124/73 (10/19 1400) SpO2:  [95 %-100 %] 100 % (10/19 1400) Last BM Date: 06/05/17  Intake/Output from previous day: 10/18 0701 - 10/19 0700 In: 2270 [I.V.:2100; IV Piggyback:170] Out: 1593 [Urine:1575; Drains:18] Intake/Output this shift: Total I/O In: 900 [I.V.:900] Out: 1350 [Urine:1350]   Lab Results:   Recent Labs  06/06/17 0417  WBC 6.4  HGB 8.8*  HCT 28.9*  PLT 254   BMET  Recent Labs  06/05/17 0921  NA 137  K 4.1  CL 102  CO2 27  GLUCOSE 160*  BUN <5*  CREATININE 0.91  CALCIUM 8.2*   PT/INR No results for input(s): LABPROT, INR in the last 72 hours. ABG No results for input(s): PHART, HCO3 in the last 72 hours.  Invalid input(s): PCO2, PO2  Studies/Results: No results found.  Anti-infectives: Anti-infectives    Start     Dose/Rate Route Frequency Ordered Stop   06/02/17 0856  polymyxin B 500,000 Units, bacitracin 50,000 Units in sodium chloride 0.9 % 500 mL irrigation  Status:  Discontinued       As needed 06/02/17 0856 06/02/17 1010   06/02/17 0856  bacitracin 50,000 Units in sodium chloride  irrigation 0.9 % 500 mL irrigation  Status:  Discontinued       As needed 06/02/17 0856 06/02/17 1010   06/02/17 0815  metroNIDAZOLE (FLAGYL) IVPB 500 mg     500 mg 100 mL/hr over 60 Minutes Intravenous To Surgery 06/02/17 0814 06/02/17 0806   05/29/17 1800  DAPTOmycin (CUBICIN) 1,000 mg in sodium chloride 0.9 % IVPB  Status:  Discontinued     1,000 mg 240 mL/hr over 30 Minutes Intravenous Every 24 hours 05/28/17 1519 05/28/17 1716   05/29/17 1600  cefTRIAXone (ROCEPHIN) 2 g in dextrose 5 % 50 mL IVPB  Status:  Discontinued     2 g 100 mL/hr over 30 Minutes Intravenous Every 24 hours 05/28/17 1517 05/28/17 1716   05/28/17 1800  cefTRIAXone (ROCEPHIN) 2 g in dextrose 5 % 50 mL IVPB     2 g 100 mL/hr over 30 Minutes Intravenous Every 24 hours 05/28/17 1716     05/28/17 1800  DAPTOmycin (CUBICIN) 1,000 mg in sodium chloride 0.9 % IVPB     1,000 mg 240 mL/hr over 30 Minutes Intravenous Every 24 hours 05/28/17 1716     05/28/17 1045  fluconazole (DIFLUCAN) tablet 200 mg  Status:  Discontinued     200 mg Oral Daily 05/28/17 1033 05/28/17 1259   05/28/17 0941  bacitracin 50,000 Units in sodium chloride irrigation 0.9 % 500 mL irrigation  Status:  Discontinued  As needed 05/28/17 0941 05/28/17 1027   05/27/17 1230  DAPTOmycin (CUBICIN) 1,000 mg in sodium chloride 0.9 % IVPB  Status:  Discontinued     1,000 mg 240 mL/hr over 30 Minutes Intravenous Every 24 hours 05/26/17 1734 05/28/17 1519   05/27/17 1230  cefTRIAXone (ROCEPHIN) 2 g in dextrose 5 % 50 mL IVPB  Status:  Discontinued     2 g 100 mL/hr over 30 Minutes Intravenous Every 24 hours 05/26/17 1734 05/28/17 1517   05/26/17 1730  metroNIDAZOLE (FLAGYL) tablet 500 mg  Status:  Discontinued     500 mg Oral 3 times daily 05/26/17 1727 05/26/17 1730   05/26/17 1730  daptomycin (CUBICIN) IVPB  Status:  Discontinued    Comments:  Indication:  Epidural abscess Last Day of Therapy:  07/14/17 Labs - Once weekly:  CBC/D, BMP, and CPK Labs -  Every other week:  ESR and CRP     1,000 mg Intravenous Every 24 hours 05/26/17 1727 05/26/17 1733   05/26/17 1730  cefTRIAXone (ROCEPHIN) IVPB  Status:  Discontinued    Comments:  Indication: Epidural Abscess Last Day of Therapy: 07/14/2017 Labs - Once weekly:  CBC/D and BMP, Labs - Every other week:  ESR and CRP     2 g Intravenous Every 24 hours 05/26/17 1727 05/26/17 1733   05/26/17 1727  ceFAZolin (ANCEF) IVPB 2g/100 mL premix  Status:  Discontinued     2 g 200 mL/hr over 30 Minutes Intravenous 30 min pre-op 05/26/17 1727 05/26/17 1731      Assessment/Plan: s/p Procedure(s) with comments: Complex wound revision (N/A) - Complex wound revision APPLICATION OF WOUND VAC (N/A) EXPLORATION OF BACK WOUND (N/A) POSSIBLE MUSCLE FLAP (N/A) POSSIBLE APPLICATION OF WOUND VAC (N/A) Will plan to see again Monday afternoon for another VAC dressing change.   If the VAC leaks over the weekend, would begin ABD pad dressings to the area and change at least twice daily and prn for drainage.  The patient needs to lie side to side as much as possible to avoid pressure over the flap and this was reinforced with the patient and nursing.   LOS: 11 days    Trust Crago,PA-C Plastic Surgery [phone]

## 2017-06-07 LAB — CK: CK TOTAL: 1456 U/L — AB (ref 38–234)

## 2017-06-07 MED ORDER — WHITE PETROLATUM EX OINT
TOPICAL_OINTMENT | CUTANEOUS | Status: AC
  Administered 2017-06-07: 1
  Filled 2017-06-07: qty 28.35

## 2017-06-07 NOTE — Progress Notes (Signed)
Patient ID: [name], female   DOB: 05-19-1951, 66 y.o.   MRN: [id number] BP (!) 146/74   Pulse 80   Temp 98 F (36.7 C) (Oral)   Resp (!) 21   Ht 5\' 6"  (1.676 m)   Wt 123 kg (271 lb 2.7 oz)   SpO2 100%   BMI 48.03 kg/m  Alert and oriented x4, speech is clear and fluent Moving all extremities Wound is dry, wound vac in place

## 2017-06-08 MED ORDER — HYDROCORTISONE 1 % EX CREA
TOPICAL_CREAM | CUTANEOUS | Status: DC | PRN
  Administered 2017-06-08: 03:00:00 via TOPICAL
  Filled 2017-06-08: qty 28

## 2017-06-08 MED ORDER — SODIUM CHLORIDE 0.9 % IV SOLN
600.0000 mg | Freq: Two times a day (BID) | INTRAVENOUS | Status: DC
  Administered 2017-06-08 – 2017-06-13 (×11): 600 mg via INTRAVENOUS
  Filled 2017-06-08 (×11): qty 600

## 2017-06-08 NOTE — Progress Notes (Signed)
Patient ID: [name], female   DOB: 1950-08-22, 66 y.o.   MRN: [id number] BP 115/64   Pulse 64   Temp 98.1 F (36.7 C) (Axillary)   Resp 19   Ht 5\' 6"  (1.676 m)   Wt 123 kg (271 lb 2.7 oz)   SpO2 100%   BMI 48.03 kg/m  Alert and oriented x 4 Moving all extremities Dressing is intact Transfer to floor

## 2017-06-08 NOTE — Progress Notes (Signed)
Due to rapid increase in CK 78 > 1456, will need stop Daptomycin. Pt is not a candidate for vancomycin due to previous drug-induced neutropenia. Dicussed with Dr. Christella Noa and curbside ID consult, will change antibiotics to ceftaroline and will plan to treat through Nov 26.  Harvel Quale  06/08/2017 12:25 PM

## 2017-06-09 LAB — CBC
HCT: 29.1 % — ABNORMAL LOW (ref 36.0–46.0)
Hemoglobin: 9.1 g/dL — ABNORMAL LOW (ref 12.0–15.0)
MCH: 26.5 pg (ref 26.0–34.0)
MCHC: 31.3 g/dL (ref 30.0–36.0)
MCV: 84.6 fL (ref 78.0–100.0)
PLATELETS: 256 10*3/uL (ref 150–400)
RBC: 3.44 MIL/uL — ABNORMAL LOW (ref 3.87–5.11)
RDW: 15 % (ref 11.5–15.5)
WBC: 5.2 10*3/uL (ref 4.0–10.5)

## 2017-06-09 NOTE — Progress Notes (Signed)
Physical Therapy Treatment Patient Details Name: [name] MRN: [id number] DOB: 1951-01-11 Today's Date: 06/09/2017    History of Present Illness Pt admitted with post operative CSF leak with hospital course of comples wound revision including lumbar drain and wound vac. s/p lumbar surgery March 2018. PMH: CHF, chronic bronchitis, arthritis, anxiety, HTN, obesity.    PT Comments    Pt with improved transfer mobility and ambulation tolerance however limited by having to have BM. Pt re-educated on back precautions. Acute PT to con't to follow to progress mobility.   Follow Up Recommendations  SNF     Equipment Recommendations  None recommended by PT    Recommendations for Other Services       Precautions / Restrictions Precautions Precautions: Fall    Mobility  Bed Mobility Overal bed mobility: Needs Assistance Bed Mobility: Rolling;Sidelying to Sit Rolling: Supervision Sidelying to sit: Min assist       General bed mobility comments: used bed rail for rolling, incresaed time, minA for trunk elevation due to body habitus, labored effort  Transfers Overall transfer level: Needs assistance Equipment used: Rolling walker (2 wheeled) Transfers: Sit to/from Stand Sit to Stand: Min assist         General transfer comment: v/c's for hand placement and to minimize trunk flexion. increased time   Ambulation/Gait Ambulation/Gait assistance: Min assist Ambulation Distance (Feet): 10 Feet (to the bathroom) Assistive device: Rolling walker (2 wheeled) Gait Pattern/deviations: Step-to pattern;Decreased stride length;Wide base of support Gait velocity: slow Gait velocity interpretation: Below normal speed for age/gender General Gait Details: pt amb to bathroom due to needing to have a bowel movement   Stairs            Wheelchair Mobility    Modified Rankin (Stroke Patients Only)       Balance Overall balance assessment: Needs  assistance Sitting-balance support: No upper extremity supported;Feet unsupported Sitting balance-Leahy Scale: Good       Standing balance-Leahy Scale: Poor Standing balance comment: dependent on RW                            Cognition Arousal/Alertness: Awake/alert Behavior During Therapy: WFL for tasks assessed/performed Overall Cognitive Status: Within Functional Limits for tasks assessed                                 General Comments: delayed processing but able to follow commands with increased time      Exercises      General Comments        Pertinent Vitals/Pain Pain Assessment: 0-10 Pain Score: 5  Pain Location: back    Home Living                      Prior Function            PT Goals (current goals can now be found in the care plan section) Progress towards PT goals: Progressing toward goals    Frequency    Min 5X/week      PT Plan Current plan remains appropriate;Frequency needs to be updated    Co-evaluation              AM-PAC PT "6 Clicks" Daily Activity  Outcome Measure  Difficulty turning over in bed (including adjusting bedclothes, sheets and blankets)?: Unable Difficulty moving from lying on back to sitting on the side  of the bed? : Unable Difficulty sitting down on and standing up from a chair with arms (e.g., wheelchair, bedside commode, etc,.)?: A Little Help needed moving to and from a bed to chair (including a wheelchair)?: A Little Help needed walking in hospital room?: A Little Help needed climbing 3-5 steps with a railing? : A Lot 6 Click Score: 13    End of Session Equipment Utilized During Treatment: Gait belt Activity Tolerance: Other (comment) (limited by having to have BM) Patient left:  (on [facility] with call bell, tech alerted) Nurse Communication: Mobility status PT Visit Diagnosis: Unsteadiness on feet (R26.81);Muscle weakness (generalized) (M62.81);Other abnormalities of gait  and mobility (R26.89);History of falling (Z91.81)     Time: 0277-4128 PT Time Calculation (min) (ACUTE ONLY): 15 min  Charges:  $Gait Training: 8-22 mins                    G Codes:       Kittie Plater, PT, DPT Pager #: [phone] Office #: [phone]    [place] 06/09/2017, 1:57 PM

## 2017-06-09 NOTE — Progress Notes (Signed)
No acute overnight events Wound vac dressing intact Moving legs well Continue wound care Ambulate when able Transfer to 4NP pending bed availability

## 2017-06-10 MED ORDER — BOOST / RESOURCE BREEZE PO LIQD
1.0000 | Freq: Two times a day (BID) | ORAL | Status: DC
  Administered 2017-06-11 – 2017-06-13 (×4): 1 via ORAL

## 2017-06-10 NOTE — Progress Notes (Signed)
PHARMACY CONSULT NOTE FOR:  OUTPATIENT  PARENTERAL ANTIBIOTIC THERAPY (OPAT)  Indication: Epidural Abscess Regimen: Ceftaroline 600 mg IV every 12 hours End date: 07/14/17  IV antibiotic discharge orders are pended. To discharging provider:  please sign these orders via discharge navigator,  Select New Orders & click on the button choice - Manage This Unsigned Work.    Thank you for allowing pharmacy to be a part of this patient's care.  Lawson Radar 06/10/2017, 2:57 PM

## 2017-06-10 NOTE — Clinical Social Work Note (Addendum)
Clinical Social Work Assessment  Patient Details  Name: [name] MRN: [id number] Date of Birth: 1950/10/29  Date of referral:  06/10/17               Reason for consult:  Facility Placement, Discharge Planning                Permission sought to share information with:  Facility Medical sales representative, Family Supports Permission granted to share information::  Yes, Verbal Permission Granted  Name::        Agency::     Relationship::  Daughter at bedside  Contact Information:     Housing/Transportation Living arrangements for the past 2 months:  Apartment Source of Information:  Patient, Adult Children Patient Interpreter Needed:  None Criminal Activity/Legal Involvement Pertinent to Current Situation/Hospitalization:  No - Comment as needed Significant Relationships:  Adult Children, Siblings, Friend Lives with:  Self Do you feel safe going back to the place where you live?  Yes Need for family participation in patient care:  Yes (Comment)  Care giving concerns:  PT recommending SNF once medically stable for discharge.   Social Worker assessment / plan:  CSW met with patient. Daughter at bedside. CSW introduced role and explained that PT recommendations would be discussed. Patient does not want to go to SNF although her daughter does. Patient stated that last time they tried to send her to a SNF there was an issue with insurance. CSW told patient CSW would look into it and let her know. CSW reviewed notes from April 2017. Patient has Medicare Part B and Medicaid. Patient does not have SNF benefit for Medicare. She would likely be able to go to SNF on Medicaid but she would have to stay a minimum of 30 days, her Medicaid check would go directly to the facility, and beds would be limited because Medicaid does not pay for therapy. The facility would have to absorb those therapy costs. Patient was getting [facility] prior to admission and says they were coming to her home 4-5  times per week. No further concerns. CSW encouraged patient to contact CSW as needed. CSW will continue to follow patient and her daughter for support and facilitate discharge to SNF once medically stable.  5:51 pm: CSW updated patient and her daughter regarding insurance barriers. Patient does not want to pursue SNF with her Medicaid because she will lose her SSI check. Patient is agreeable to returning home with home health through [facility]. CSW signing off. Consult again if any other social work needs arise.  Employment status:  Retired Health and safety inspector:  Armed forces operational officer, Medicaid In [place] PT Recommendations:  Skilled Nursing Facility Information / Referral to community resources:  Skilled Nursing Facility  Patient/Family's Response to care:  Patient prefers to return home with HHPT. Patient's family supportive and involved in patient's care. Patient and her daughter appreciated social work intervention.  Patient/Family's Understanding of and Emotional Response to Diagnosis, Current Treatment, and Prognosis:  Patient and her daughter have a good understanding of the reason for admission and her need for therapy after discharge. Patient appears pleased with hospital care.  Emotional Assessment Appearance:  Appears stated age Attitude/Demeanor/Rapport:  Other (Flat affect) Affect (typically observed):  Appropriate, Flat, Pleasant Orientation:  Oriented to Self, Oriented to Place, Oriented to  Time, Oriented to Situation Alcohol / Substance use:  Never Used Psych involvement (Current and /or in the community):  No (Comment)  Discharge Needs  Concerns to be addressed:  Care Coordination Readmission  within the last 30 days:  Yes Current discharge risk:  Dependent with Mobility, Lives alone Barriers to Discharge:  Continued Medical Work up, Other (Medicare Part B and Medicaid only)   Candie Chroman, LCSW 06/10/2017, 5:18 PM

## 2017-06-10 NOTE — Progress Notes (Signed)
OT Cancellation Note  Patient Details Name: TAREKA JHAVERI MRN: [id number] DOB: 1950-12-07   Cancelled Treatment:    Reason Eval/Treat Not Completed: Fatigue/lethargy limiting ability to participate.  Pt just received meds for anxiety and pain, and is now fatigued and resting comfortably.  Will reattempt.  [place], OTR/L [phone]   Lucille Passy M 06/10/2017, 1:05 PM

## 2017-06-10 NOTE — Plan of Care (Signed)
Problem: Activity: Goal: Risk for activity intolerance will decrease Outcome: Progressing Patient ambulating in room, using BSC

## 2017-06-10 NOTE — Progress Notes (Signed)
PT Cancellation Note  Patient Details Name: [name] MRN: [id number] DOB: 1951-02-07   Cancelled Treatment:    Reason Eval/Treat Not Completed: Patient not medically ready. RN reports just giving pt anxiety and pain meds and she is now resting comfortably as pt was with high anxiety and screaming in pain. Acute PT to return as able to progress ambulation.  Antinette Keough M Cliff Damiani 06/10/2017, 12:14 PM  Kittie Plater, PT, DPT Pager #: [phone] Office #: [phone]

## 2017-06-10 NOTE — Progress Notes (Signed)
No acute events Moving legs well Incision c/d/i with vac in place Ambulate with therapy OK to go to 3W if 4NP not available

## 2017-06-10 NOTE — Progress Notes (Signed)
Initial Nutrition Assessment  DOCUMENTATION CODES:   Severe malnutrition in context of acute illness/injury  INTERVENTION:   -Boost Breeze BID each supplement provides 250 kcals and 9 grams of protein  -Magic Cup daily provides 290 kcals and 9 grams of protein  NUTRITION DIAGNOSIS:   Malnutrition (severe) related to acute illness (CSF leak, poor appetite) as evidenced by energy intake < or equal to 50% for > or equal to 5 days, per patient/family report, percent weight loss.  GOAL:   Patient will meet greater than or equal to 90% of their needs   MONITOR:   PO intake, Supplement acceptance, Labs, Weight trends  REASON FOR ASSESSMENT:   Other (Comment) (decreased intake)    ASSESSMENT:   Pt is a 66 year old female with a postoperative CSF leak (lumbar surgery 10/2016) with wound revision including lumbar drain and wound vac.PMH of  CHF, chronic bronchitis, arthritis, anxiety, IBS, HTN, and obesity.   Pt reports that her appetite has been poor the past 3 weeks and she has only consumed one small meal per day such as a bowl of cereal. Since admission, pts appetite has remained poor and she reports getting full very quickly. Per chart, pt has ate, on average, 40% of her meals.   Pt denies drinking nutritional supplements at home but says she tried an Ensure here and it caused diarrhea. Pt is willing to try other nutritional supplements to aid in her healing. Per previous RD note, pt likes [employer]. Will order Boost Breeze BID. Pt willing to try Magic Cup daily.   Pt feels that she's lost a few lbs in the past several weeks but is unsure of amount. States her UBW is 270 lbs. Weighed pt today at 253 lbs (115 kg). This indicates a 6.6% weight loss since 05/26/17, severe for time frame.    Pt has wound vac on back. Total output of 75 mL x 24 hours.   Medications: Lovenox, Lasix, Protonix  Labs: Reviewed  Nutrition Focused Physical Exam Findings: No fat depletion. No muscle  depletion. No edema.   Diet Order:  Diet Heart Room service appropriate? Yes; Fluid consistency: Thin  Skin:  Reviewed, no issues (MASD abdomen and groin)  Last BM:  06/09/17  Height:   Ht Readings from Last 1 Encounters:  05/28/17 5\' 6"  (1.676 m)    Weight:   Wt Readings from Last 1 Encounters:  06/10/17 253 lb 8.5 oz (115 kg)    Ideal Body Weight:  59.1 kg  BMI:  Body mass index is 40.92 kg/m.  Estimated Nutritional Needs:   Kcal:  1500-1700  Protein:  75-85 grams   Fluid:  >1.5 L  EDUCATION NEEDS:   No education needs identified at this time  [place] Intern Pager: [phone] 06/10/2017 4:25 PM

## 2017-06-11 LAB — CBC
HEMATOCRIT: 29.5 % — AB (ref 36.0–46.0)
HEMOGLOBIN: 9 g/dL — AB (ref 12.0–15.0)
MCH: 26 pg (ref 26.0–34.0)
MCHC: 30.5 g/dL (ref 30.0–36.0)
MCV: 85.3 fL (ref 78.0–100.0)
Platelets: 243 10*3/uL (ref 150–400)
RBC: 3.46 MIL/uL — AB (ref 3.87–5.11)
RDW: 15.9 % — ABNORMAL HIGH (ref 11.5–15.5)
WBC: 4.8 10*3/uL (ref 4.0–10.5)

## 2017-06-11 LAB — CREATININE, SERUM
Creatinine, Ser: 1.09 mg/dL — ABNORMAL HIGH (ref 0.44–1.00)
GFR calc Af Amer: 59 mL/min — ABNORMAL LOW (ref >60)
GFR, EST NON AFRICAN AMERICAN: 51 mL/min — AB (ref >60)

## 2017-06-11 MED ORDER — HEPARIN SODIUM (PORCINE) 5000 UNIT/ML IJ SOLN
5000.0000 [IU] | Freq: Three times a day (TID) | INTRAMUSCULAR | Status: DC
  Administered 2017-06-11 – 2017-06-13 (×6): 5000 [IU] via SUBCUTANEOUS
  Filled 2017-06-11 (×7): qty 1

## 2017-06-11 NOTE — Progress Notes (Signed)
[place] hospital team will continue to follow for [facility] needs until DC.  [facility] is prepared for DC back home when ordered.  If patient discharges after hours, please call [phone].   Larry Sierras 06/11/2017, 5:24 PM

## 2017-06-11 NOTE — Progress Notes (Signed)
Physical Therapy Treatment Patient Details Name: [name] MRN: [id number] DOB: [birth year] Today's Date: 06/11/2017    History of Present Illness Pt admitted with post operative CSF leak with hospital course of comples wound revision including lumbar drain and wound vac. s/p lumbar surgery March 2018. PMH: CHF, chronic bronchitis, arthritis, anxiety, HTN, obesity.    PT Comments    Patient progressing slowly towards PT goals. Improved ambulation distance with encouragement. Pt lethargic during session and declined staying up in chair. Requires Min A for bed mobility and gait training due to right knee instability. Encouraged up in chair for all meals. Needed cues to maintain back precautions. Will follow.   Follow Up Recommendations  SNF     Equipment Recommendations  None recommended by PT    Recommendations for Other Services       Precautions / Restrictions Precautions Precautions: Fall Restrictions Weight Bearing Restrictions: No    Mobility  Bed Mobility Overal bed mobility: Needs Assistance Bed Mobility: Rolling;Sidelying to Sit;Sit to Sidelying Rolling: Supervision Sidelying to sit: Min assist;HOB elevated     Sit to sidelying: Min assist;HOB elevated General bed mobility comments: used bed rail for rolling, increased time, minA for trunk elevation due to body habitus, labored effort. Reviewed log roll technique.  Transfers Overall transfer level: Needs assistance Equipment used: Rolling walker (2 wheeled) Transfers: Sit to/from Stand Sit to Stand: Min assist;Min guard Stand pivot transfers: Min guard       General transfer comment: v/c's for hand placement and to minimize trunk flexion. increased time. Min A from EOB progressing to Min guard assist from chair x2. SPT chair to bed Min guard assist.   Ambulation/Gait Ambulation/Gait assistance: Min assist Ambulation Distance (Feet): 14 Feet (x2 bouts) Assistive device: Rolling walker (2  wheeled) Gait Pattern/deviations: Step-through pattern;Decreased stride length;Wide base of support;Decreased stance time - right Gait velocity: slow   General Gait Details: Slow, unsteady gait with right knee instability but no buckling. Fatigues. Seated rest break.   Stairs            Wheelchair Mobility    Modified Rankin (Stroke Patients Only)       Balance Overall balance assessment: Needs assistance Sitting-balance support: No upper extremity supported;Feet unsupported Sitting balance-Leahy Scale: Good     Standing balance support: During functional activity Standing balance-Leahy Scale: Poor Standing balance comment: dependent on RW                            Cognition Arousal/Alertness: Lethargic Behavior During Therapy: WFL for tasks assessed/performed Overall Cognitive Status: Within Functional Limits for tasks assessed                                        Exercises      General Comments General comments (skin integrity, edema, etc.): VSS throughout      Pertinent Vitals/Pain Pain Assessment: Faces Faces Pain Scale: Hurts little more Pain Location: back Pain Descriptors / Indicators: Sore Pain Intervention(s): Monitored during session;Repositioned    Home Living                      Prior Function            PT Goals (current goals can now be found in the care plan section) Progress towards PT goals: Progressing toward goals  Frequency    Min 5X/week      PT Plan Current plan remains appropriate    Co-evaluation              AM-PAC PT "6 Clicks" Daily Activity  Outcome Measure  Difficulty turning over in bed (including adjusting bedclothes, sheets and blankets)?: Unable Difficulty moving from lying on back to sitting on the side of the bed? : Unable Difficulty sitting down on and standing up from a chair with arms (e.g., wheelchair, bedside commode, etc,.)?: Unable Help needed  moving to and from a bed to chair (including a wheelchair)?: A Little Help needed walking in hospital room?: A Little Help needed climbing 3-5 steps with a railing? : A Lot 6 Click Score: 11    End of Session Equipment Utilized During Treatment: Gait belt Activity Tolerance: Patient limited by fatigue Patient left: in bed;with call bell/phone within reach;with nursing/sitter in room (tech in room placing purewick) Nurse Communication: Mobility status PT Visit Diagnosis: Unsteadiness on feet (R26.81);Muscle weakness (generalized) (M62.81);Other abnormalities of gait and mobility (R26.89);History of falling (Z91.81)     Time: 9381-0175 PT Time Calculation (min) (ACUTE ONLY): 28 min  Charges:  $Gait Training: 8-22 mins $Therapeutic Activity: 8-22 mins                    G Codes:       Wray Kearns, PT, DPT [phone]     Marguarite Arbour A Peirce Deveney 06/11/2017, 1:48 PM

## 2017-06-11 NOTE — Care Management Note (Signed)
Case Management Note Original Note By: Pollie Friar, RN 05/27/2017, 11:38 AM  Patient Details  Name: [name] MRN: [id number] Date of Birth: Nov 26, 1950  Subjective/Objective:   Pt with postoperative CSF leak. She is from home with a roommate and aide. She was active with [facility] for IV antibiotics prior to admission.                  Action/Plan: Plan is for her to go to OR today. CM following for d/c needs post surgery.  Expected Discharge Date:                  Expected Discharge Plan:  [place]  In-House Referral:     Discharge planning Services  CM Consult  Post Acute Care Choice:  Home Health Choice offered to:  Patient  DME Arranged:    DME Agency:     HH Arranged:  RN [place] Agency:  [facility]  Status of Service:  In process, will continue to follow  If discussed at Long Length of Stay Meetings, dates discussed:    Additional Comments:  06/11/17 J. Shakela Donati, RN, BSN PT/OT recommending SNF for rehab, though pt desires to go home with [facility] care.  Pt active with [facility] prior to admission for IV antibiotics, and [place] agency is aware of admission.  Pt will need resumption orders for [facility] at dc for continuation of IV antibiotic therapy.  Pt states she is able to give herself IV infusions, but has assistance from her roommate, son and daughter.  Upon dc, pt will need HHPT/OT as well for therapies at home.  Pt states she has all needed DME at home.    Reinaldo Raddle, RN, BSN  Trauma/Neuro ICU Case Manager [phone]

## 2017-06-12 LAB — CBC
HEMATOCRIT: 30.9 % — AB (ref 36.0–46.0)
HEMOGLOBIN: 9.7 g/dL — AB (ref 12.0–15.0)
MCH: 26.6 pg (ref 26.0–34.0)
MCHC: 31.4 g/dL (ref 30.0–36.0)
MCV: 84.7 fL (ref 78.0–100.0)
Platelets: 256 10*3/uL (ref 150–400)
RBC: 3.65 MIL/uL — ABNORMAL LOW (ref 3.87–5.11)
RDW: 15.3 % (ref 11.5–15.5)
WBC: 4.7 10*3/uL (ref 4.0–10.5)

## 2017-06-12 NOTE — Progress Notes (Signed)
Neurosurgery Progress Note  No issues overnight. Pain controlled Ambulating. Minimal appetite  EXAM:  BP 132/79 (BP Location: Left Wrist)   Pulse 91   Temp 98.1 F (36.7 C) (Oral)   Resp 18   Ht 5\' 3"  (1.6 m)   Wt 124.3 kg (274 lb 0.5 oz)   SpO2 100%   BMI 48.54 kg/m   Awake, alert, oriented  Speech fluent, appropriate  MAEW with good strength  PLAN Progressing nicely [place] arraged with [facility]. Discharge tomorrow.

## 2017-06-12 NOTE — Progress Notes (Addendum)
Physical Therapy Treatment Patient Details Name: [name] MRN: [id number] DOB: 08-07-1951 Today's Date: 06/12/2017    History of Present Illness Pt admitted with post operative CSF leak with hospital course of comples wound revision including lumbar drain and wound vac. s/p lumbar surgery March 2018. PMH: CHF, chronic bronchitis, arthritis, anxiety, HTN, obesity.    PT Comments    Pt progressing slowly towards all goals. Pt c/o during ambulation was SOB and "why am I getting so tired so fast."  Pt did tolerate a total of 60' of ambulation.  Pt con't to required assist for all mobility Acute PT to con't to follow.   Follow Up Recommendations  SNF     Equipment Recommendations  None recommended by PT    Recommendations for Other Services       Precautions / Restrictions Precautions Precautions: Fall;Back Precaution Comments: went over BLT Restrictions Weight Bearing Restrictions: No    Mobility  Bed Mobility Overal bed mobility: Needs Assistance Bed Mobility: Rolling;Sidelying to Sit Rolling: Supervision Sidelying to sit: Min assist;HOB elevated       General bed mobility comments: v/c's to adhere to back precautions, minA for trunk elevation  Transfers Overall transfer level: Needs assistance Equipment used: Rolling walker (2 wheeled) Transfers: Sit to/from Stand Sit to Stand: Min assist         General transfer comment: v/c's for hand placement, increased time  Ambulation/Gait Ambulation/Gait assistance: Min guard Ambulation Distance (Feet): 20 Feet (x3) Assistive device: Rolling walker (2 wheeled) Gait Pattern/deviations: Step-through pattern;Decreased stride length;Wide base of support;Decreased stance time - right Gait velocity: slow Gait velocity interpretation: Below normal speed for age/gender General Gait Details: Slow, unsteady gait with right knee instability but no buckling. Fatigues. Seated rest break.   Stairs             Wheelchair Mobility    Modified Rankin (Stroke Patients Only)       Balance Overall balance assessment: Needs assistance Sitting-balance support: No upper extremity supported;Feet unsupported Sitting balance-Leahy Scale: Good     Standing balance support: During functional activity Standing balance-Leahy Scale: Poor Standing balance comment: dependent on RW                            Cognition Arousal/Alertness: Awake/alert Behavior During Therapy: WFL for tasks assessed/performed Overall Cognitive Status: Within Functional Limits for tasks assessed                                 General Comments: mildly anxious regarding long distance ambulation with urinary incontinence      Exercises      General Comments General comments (skin integrity, edema, etc.): SpO2 >97% on RA      Pertinent Vitals/Pain Pain Assessment: 0-10 Pain Score: 8  Pain Location: back Pain Descriptors / Indicators: Sore Pain Intervention(s): Monitored during session    Home Living                      Prior Function            PT Goals (current goals can now be found in the care plan section) Progress towards PT goals: Progressing toward goals    Frequency    Min 5X/week      PT Plan Current plan remains appropriate    Co-evaluation  AM-PAC PT "6 Clicks" Daily Activity  Outcome Measure  Difficulty turning over in bed (including adjusting bedclothes, sheets and blankets)?: Unable Difficulty moving from lying on back to sitting on the side of the bed? : Unable Difficulty sitting down on and standing up from a chair with arms (e.g., wheelchair, bedside commode, etc,.)?: A Little Help needed moving to and from a bed to chair (including a wheelchair)?: A Little Help needed walking in hospital room?: A Little Help needed climbing 3-5 steps with a railing? : A Lot 6 Click Score: 13    End of Session Equipment Utilized During  Treatment: Gait belt Activity Tolerance: Patient limited by fatigue Patient left: in chair;with call bell/phone within reach Nurse Communication: Mobility status PT Visit Diagnosis: Unsteadiness on feet (R26.81);Muscle weakness (generalized) (M62.81);Other abnormalities of gait and mobility (R26.89);History of falling (Z91.81)     Time: 4695-0722 PT Time Calculation (min) (ACUTE ONLY): 23 min  Charges:  $Gait Training: 8-22 mins $Therapeutic Activity: 8-22 mins                    G Codes:       Kittie Plater, PT, DPT Pager #: [phone] Office #: [phone]    [place] 06/12/2017, 2:38 PM

## 2017-06-12 NOTE — Plan of Care (Signed)
Problem: Activity: Goal: Risk for activity intolerance will decrease Outcome: Progressing Patient continuing to ambulate with therapy and staff in halls/room, anticipating discharge.

## 2017-06-12 NOTE — Consult Note (Signed)
WOC contacted bedside nurse, patient has wound care orders from plastic surgery team that are current. Plans for patient to DC to home with [facility].  No other needs for WOC nurse at this time. If bedside nursing staff feel other wound care orders needed, please contact plastic surgery team.  [place], [place], [place]

## 2017-06-12 NOTE — Progress Notes (Signed)
   06/12/17 1500  OT Visit Information  Last OT Received On 06/12/17  Assistance Needed +1  History of Present Illness Pt admitted with post operative CSF leak with hospital course of comples wound revision including lumbar drain and wound vac. s/p lumbar surgery March 2018. PMH: CHF, chronic bronchitis, arthritis, anxiety, HTN, obesity.  Precautions  Precautions Fall;Back  Precaution Comments educated in bed mobility avoiding twisting  Pain Assessment  Pain Assessment Faces  Faces Pain Scale 8  Pain Location back  Pain Descriptors / Indicators (tearful)  Pain Intervention(s) Monitored during session;Repositioned  Cognition  Arousal/Alertness Awake/alert  Behavior During Therapy WFL for tasks assessed/performed  Overall Cognitive Status Within Functional Limits for tasks assessed  ADL  Overall ADL's  Needs assistance/impaired  Grooming Wash/dry hands;Sitting;Set up  Upper Body Dressing  Supervision/safety;Sitting  Toilet Transfer Min guard;Stand-pivot;RW;BSC  Toileting- Water quality scientist and Hygiene Min guard;Sit to/from stand  General ADL Comments pt sitting with flexed posture in chair, complaining of pain  Bed Mobility  Overal bed mobility Needs Assistance  Bed Mobility Sit to Sidelying;Rolling  Rolling Supervision  Sidelying to sit Min assist;HOB elevated  General bed mobility comments verbal cues for technique, assist for LEs in bed  Balance  Overall balance assessment Needs assistance  Sitting balance-Leahy Scale Good  Standing balance-Leahy Scale Poor  Standing balance comment dependent on RW  Transfers  Overall transfer level Needs assistance  Equipment used Rolling walker (2 wheeled)  Transfers Sit to/from Stand  Sit to Stand Min guard  Stand pivot transfers Min guard  General transfer comment v/c's for hand placement, increased time  OT - End of Session  Equipment Utilized During Treatment Gait belt;Rolling walker  Activity Tolerance Patient limited by pain   Patient left in bed;with call bell/phone within reach;with nursing/sitter in room  OT Assessment/Plan  OT Plan Discharge plan needs to be updated  OT Visit Diagnosis Unsteadiness on feet (R26.81);Muscle weakness (generalized) (M62.81)  OT Frequency (ACUTE ONLY) Min 2X/week  Follow Up Recommendations Home health OT  OT Equipment None recommended by OT  AM-PAC OT "6 Clicks" Daily Activity Outcome Measure  Help from another person eating meals? 4  Help from another person taking care of personal grooming? 3  Help from another person toileting, which includes using toliet, bedpan, or urinal? 3  Help from another person bathing (including washing, rinsing, drying)? 2  Help from another person to put on and taking off regular upper body clothing? 4  Help from another person to put on and taking off regular lower body clothing? 2  6 Click Score 18  ADL G Code Conversion CK  OT Goal Progression  Progress towards OT goals Progressing toward goals  Acute Rehab OT Goals  Patient Stated Goal to return home  OT Goal Formulation With patient  Time For Goal Achievement 06/20/17  Potential to Achieve Goals Good  OT Time Calculation  OT Start Time (ACUTE ONLY) 1346  OT Stop Time (ACUTE ONLY) 1401  OT Time Calculation (min) 15 min  OT General Charges  $OT Visit 1 Visit  OT Treatments  $Self Care/Home Management  8-22 mins  06/12/2017 Nestor Lewandowsky, OTR/L Pager: [phone]

## 2017-06-13 DIAGNOSIS — E43 Unspecified severe protein-calorie malnutrition: Secondary | ICD-10-CM | POA: Insufficient documentation

## 2017-06-13 LAB — BASIC METABOLIC PANEL
Anion gap: 9 (ref 5–15)
BUN: <5 mg/dL — ABNORMAL LOW (ref 6–20)
CO2: 24 mmol/L (ref 22–32)
Calcium: 8.4 mg/dL — ABNORMAL LOW (ref 8.9–10.3)
Chloride: 104 mmol/L (ref 101–111)
Creatinine, Ser: 1.13 mg/dL — ABNORMAL HIGH (ref 0.44–1.00)
GFR calc Af Amer: 57 mL/min — ABNORMAL LOW (ref >60)
GFR calc non Af Amer: 49 mL/min — ABNORMAL LOW (ref >60)
Glucose, Bld: 92 mg/dL (ref 65–99)
Potassium: 4.1 mmol/L (ref 3.5–5.1)
Sodium: 137 mmol/L (ref 135–145)

## 2017-06-13 LAB — CBC
HCT: 31.2 % — ABNORMAL LOW (ref 36.0–46.0)
Hemoglobin: 9.6 g/dL — ABNORMAL LOW (ref 12.0–15.0)
MCH: 26.3 pg (ref 26.0–34.0)
MCHC: 30.8 g/dL (ref 30.0–36.0)
MCV: 85.5 fL (ref 78.0–100.0)
Platelets: 248 10*3/uL (ref 150–400)
RBC: 3.65 MIL/uL — ABNORMAL LOW (ref 3.87–5.11)
RDW: 15.9 % — ABNORMAL HIGH (ref 11.5–15.5)
WBC: 4.4 10*3/uL (ref 4.0–10.5)

## 2017-06-13 MED ORDER — HEPARIN SOD (PORK) LOCK FLUSH 100 UNIT/ML IV SOLN
250.0000 [IU] | INTRAVENOUS | Status: AC | PRN
  Administered 2017-06-13: 250 [IU]

## 2017-06-13 MED ORDER — CEFTAROLINE IV (FOR PTA / DISCHARGE USE ONLY): 600.0000 mg | Freq: Two times a day (BID) | INTRAVENOUS | 0 refills | Status: DC

## 2017-06-13 MED ORDER — OXYCODONE-ACETAMINOPHEN 5-325 MG PO TABS: 1.0000 | ORAL_TABLET | Freq: Four times a day (QID) | ORAL | 0 refills | Status: DC | PRN

## 2017-06-13 NOTE — Progress Notes (Signed)
Physical Therapy Treatment Patient Details Name: [name] MRN: [id number] DOB: 09/27/1950 Today's Date: 06/13/2017    History of Present Illness Pt admitted with post operative CSF leak with hospital course of comples wound revision including lumbar drain and wound vac. s/p lumbar surgery March 2018. PMH: CHF, chronic bronchitis, arthritis, anxiety, HTN, obesity.    PT Comments    Pt is progressing well with mobility, she still needs min assist at most for safe mobility around her room, and functional reminders of back precautions (handout given again).  Pt will need physical assist at home and reports she does have someone there helping her and is due to dc home today.  PT to follow acutely until d/c confirmed.      Follow Up Recommendations  Home health PT;Supervision for mobility/OOB     Equipment Recommendations  None recommended by PT    Recommendations for Other Services   NA     Precautions / Restrictions Precautions Precautions: Fall;Back Precaution Booklet Issued: Yes (comment) Precaution Comments: back handout given and reviewed.  Pt able to report 3 of 3 precautions verbally, but needed cues physically to comply Restrictions Weight Bearing Restrictions: No    Mobility  Bed Mobility Overal bed mobility: Needs Assistance Bed Mobility: Rolling;Sidelying to Sit Rolling: Supervision Sidelying to sit: Min assist       General bed mobility comments: Min assist to support trunk to get to sitting EOB.  HOB flat and no rail to simulate home set up as well as coming out of the right side of the bed.  Pt was able to roll (cues for log roll technique) with supervision, but needed assist to power up from side lying.   Transfers Overall transfer level: Needs assistance Equipment used: Rolling walker (2 wheeled) Transfers: Sit to/from Stand Sit to Stand: Min guard         General transfer comment: Min guard assist for safety with verbal cues for safe hand  placement especially when going to sitting on BSC in bathroom and to recliner chair to remind her to reach back.    Ambulation/Gait Ambulation/Gait assistance: Min guard;Min assist Ambulation Distance (Feet): 20 Feet Assistive device: Rolling walker (2 wheeled) Gait Pattern/deviations: Step-through pattern;Trunk flexed Gait velocity: decreased   General Gait Details: Min assist for one LOB backwards when going up incline into bathroom.  Otherwise min guard assist throughout the rest of gait to and from bathroom.  Pt did not want to leave the room as she was expecting a phone call re: who was taking her home later today.        Balance Overall balance assessment: Needs assistance Sitting-balance support: Feet supported;No upper extremity supported Sitting balance-Leahy Scale: Good     Standing balance support: No upper extremity supported Standing balance-Leahy Scale: Poor Standing balance comment: dependent on RW                            Cognition Arousal/Alertness: Awake/alert Behavior During Therapy: WFL for tasks assessed/performed Overall Cognitive Status: Within Functional Limits for tasks assessed                                               Pertinent Vitals/Pain Pain Assessment: 0-10 Pain Score: 8  Pain Location: back Pain Descriptors / Indicators: Sore Pain Intervention(s): Limited activity within patient's  tolerance;Monitored during session;Repositioned           PT Goals (current goals can now be found in the care plan section) Acute Rehab PT Goals Patient Stated Goal: to return home Progress towards PT goals: Progressing toward goals    Frequency    Min 5X/week      PT Plan Discharge plan needs to be updated       AM-PAC PT "6 Clicks" Daily Activity  Outcome Measure  Difficulty turning over in bed (including adjusting bedclothes, sheets and blankets)?: None Difficulty moving from lying on back to sitting on the  side of the bed? : Unable Difficulty sitting down on and standing up from a chair with arms (e.g., wheelchair, bedside commode, etc,.)?: Unable Help needed moving to and from a bed to chair (including a wheelchair)?: A Little Help needed walking in hospital room?: A Little Help needed climbing 3-5 steps with a railing? : A Little 6 Click Score: 15    End of Session Equipment Utilized During Treatment: Gait belt Activity Tolerance: Patient limited by fatigue;Patient limited by pain Patient left: in chair;with call bell/phone within reach Nurse Communication: Mobility status PT Visit Diagnosis: Unsteadiness on feet (R26.81);Muscle weakness (generalized) (M62.81);Other abnormalities of gait and mobility (R26.89);History of falling (Z91.81)     Time: 0240-9735 PT Time Calculation (min) (ACUTE ONLY): 19 min  Charges:  $Gait Training: 8-22 mins          Anusha Claus B. Mysha Peeler, PT, DPT [phone]            06/13/2017, 12:59 PM

## 2017-06-13 NOTE — Care Management Note (Signed)
Case Management Note Original Note By: Pollie Friar, RN 05/27/2017, 11:38 AM  Patient Details  Name: [name] MRN: [id number] Date of Birth: 04-21-1951  Subjective/Objective:   Pt with postoperative CSF leak. She is from home with a roommate and aide. She was active with [facility] for IV antibiotics prior to admission.                  Action/Plan: Plan is for her to go to OR today. CM following for d/c needs post surgery.  Expected Discharge Date:     06/13/17              Expected Discharge Plan:  [place]  In-House Referral:     Discharge planning Services  CM Consult  Post Acute Care Choice:  Home Health Choice offered to:  Patient  DME Arranged:    DME Agency:     HH Arranged:  RN [place] Agency:  [place]  Status of Service:  Completed, signed off  If discussed at [place] of Stay Meetings, dates discussed:    Additional Comments:  06/11/17 J. Amond Speranza, RN, BSN PT/OT recommending SNF for rehab, though pt desires to go home with [facility] care.  Pt active with [facility] prior to admission for IV antibiotics, and [place] agency is aware of admission.  Pt will need resumption orders for [facility] at dc for continuation of IV antibiotic therapy.  Pt states she is able to give herself IV infusions, but has assistance from her roommate, son and daughter.  Upon dc, pt will need HHPT/OT as well for therapies at home.  Pt states she has all needed DME at home.    06/13/17 J. Melton Walls, RN, BSN Pt medically stable for discharge home today.  [facility] aware of planned discharge.  Pt will need to receive IV Teflaro prior to dc home; [facility] nurse to follow up with pt on Saturday for continued assistance with home IV infusions.    Reinaldo Raddle, RN, BSN  Trauma/Neuro ICU Case Manager [phone]

## 2017-06-13 NOTE — Discharge Summary (Signed)
Physician Discharge Summary  Patient ID: [name] MRN: [id number] DOB/AGE: 1951/07/15 66 y.o.  Admit date: 05/26/2017 Discharge date: 06/13/2017  Admission Diagnoses:  Postoperative CSF leak  Discharge Diagnoses:  Same Active Problems:   Postoperative CSF leak   Protein-calorie malnutrition, severe  Discharged Condition: Stable  Hospital Course:  [name] is a 66 y.o. female who was admitted due to CSF leak. Repaired by [facility] but due to wound dehescience, wound continued to drain CSF requiring plastic surgery to assist with closure. At time of discharge, pain was well controlled, ambulating with Pt/OT, tolerating po, voiding normal. PT/OT rec SNF but patient denies. HH OT/PT/Nursing ordered  Treatments: Surgery Exploration of lumbar wound, repair of CSF leak, placement of lumbar drain  Discharge Exam: Blood pressure (!) 89/65, pulse 61, temperature 98.3 F (36.8 C), temperature source Oral, resp. rate 18, height '5\' 3"'$  (1.6 m), weight 124.3 kg (274 lb 0.5 oz), SpO2 100 %. Awake, alert, oriented Speech fluent, appropriate CN grossly intact MAEW with good strength Wound c/d/i  Disposition: 01-Home or Self Care  Discharge Instructions    Call MD for:  difficulty breathing, headache or visual disturbances    Complete by:  As directed    Call MD for:  persistant dizziness or light-headedness    Complete by:  As directed    Call MD for:  redness, tenderness, or signs of infection (pain, swelling, redness, odor or green/yellow discharge around incision site)    Complete by:  As directed    Call MD for:  severe uncontrolled pain    Complete by:  As directed    Call MD for:  temperature >100.4    Complete by:  As directed    Diet general    Complete by:  As directed    Driving Restrictions    Complete by:  As directed    Do not drive until given clearance.   Home infusion instructions [facility] May follow [place] Dosing Protocol; May administer  Cathflo as needed to maintain patency of vascular access device.; Flushing of vascular access device: per [facility] Protocol: 0.9% NaCl pre/post medica...    Complete by:  As directed    Instructions:  May follow [place] Dosing Protocol   Instructions:  May administer Cathflo as needed to maintain patency of vascular access device.   Instructions:  Flushing of vascular access device: per [facility] Protocol: 0.9% NaCl pre/post medication administration and prn patency; Heparin 100 u/ml, 28m for implanted ports and Heparin 10u/ml, 515mfor all other central venous catheters.   Instructions:  May follow [facility] Anaphylaxis Protocol for First Dose Administration in the home: 0.9% NaCl at 25-50 ml/hr to maintain IV access for protocol meds. Epinephrine 0.3 ml IV/IM PRN and Benadryl 25-50 IV/IM PRN s/s of anaphylaxis.   Instructions:  [facility] Coordinator (RN) to assist per patient IV care needs in the home PRN.   Increase activity slowly    Complete by:  As directed    Lifting restrictions    Complete by:  As directed    Do not lift anything >10lbs. Avoid bending and twisting in awkward positions. Avoid bending at the back.   May shower / Bathe    Complete by:  As directed    In 24 hours. Okay to wash wound with warm soapy water. Avoid scrubbing the wound. Pat dry.   Remove dressing in 24 hours    Complete by:  As directed  Allergies as of 06/13/2017      Reactions   Vancomycin Other (See Comments)   Drug induced NEUTROPENIA   Aspirin Other (See Comments)   Stomach bleeding   Ibuprofen Other (See Comments)   Stomach bleeding   Mushroom Extract Complex Hives, Itching   Shellfish Allergy Hives, Itching   Sulfa Antibiotics Hives, Itching   Ciprofloxacin Other (See Comments)   Made "bottom" raw   Metronidazole Other (See Comments)   Made "bottom" raw   Tylenol [acetaminophen] Itching   Augmentin [amoxicillin-pot Clavulanate] Itching   Has never had problems with amoxicillin/  penicillin HAS TOLERATED ZOSYN WITH ZERO PROBLEMS   Betadine [povidone Iodine] Itching   Coconut Flavor Itching   Codeine Itching   Eggs Or Egg-derived Products Nausea And Vomiting   Iodine Itching   Ivp Dye [iodinated Diagnostic Agents] Hives   Takes Benadryl '50mg'$  PO before receiving iodinated contrast      Medication List    STOP taking these medications   cefTRIAXone IVPB Commonly known as:  ROCEPHIN   metroNIDAZOLE 500 MG tablet Commonly known as:  FLAGYL   oxyCODONE 10 mg 12 hr tablet Commonly known as:  OXYCONTIN   oxyCODONE 5 MG immediate release tablet Commonly known as:  Oxy IR/ROXICODONE     TAKE these medications   VENTOLIN HFA 108 (90 Base) MCG/ACT inhaler Generic drug:  albuterol Inhale 2 puffs into the lungs every 4 (four) hours as needed for wheezing or shortness of breath.   albuterol (2.5 MG/3ML) 0.083% nebulizer solution Commonly known as:  PROVENTIL Take 2.5 mg by nebulization every 6 (six) hours as needed for wheezing.   buPROPion 100 MG 12 hr tablet Commonly known as:  WELLBUTRIN SR Take 100 mg by mouth daily.   ceftaroline IVPB Commonly known as:  TEFLARO Inject 600 mg into the vein every 12 (twelve) hours. Indication:  Epidural Abscess Last Day of Therapy:  07/14/17 Labs - Once weekly:  CBC/D and BMP, Labs - Every other week:  ESR and CRP   citalopram 40 MG tablet Commonly known as:  CELEXA Take 40 mg by mouth daily.   cloNIDine 0.3 mg/24hr patch Commonly known as:  CATAPRES - Dosed in mg/24 hr Place 0.3 mg onto the skin every Thursday.   daptomycin IVPB Commonly known as:  CUBICIN Inject 1,000 mg into the vein daily. Indication:  Epidural abscess Last Day of Therapy:  07/14/17 Labs - Once weekly:  CBC/D, BMP, and CPK Labs - Every other week:  ESR and CRP   furosemide 80 MG tablet Commonly known as:  LASIX Take 80 mg by mouth daily.   losartan-hydrochlorothiazide 100-12.5 MG tablet Commonly known as:  HYZAAR Take 1 tablet by mouth  daily.   metoprolol tartrate 25 MG tablet Commonly known as:  LOPRESSOR Take 25 mg by mouth daily.   NEXIUM 40 MG capsule Generic drug:  esomeprazole Take 40 mg by mouth daily.   ondansetron 4 MG tablet Commonly known as:  ZOFRAN Take 1 tablet (4 mg total) by mouth every 6 (six) hours as needed for nausea or vomiting.   oxyCODONE-acetaminophen 5-325 MG tablet Commonly known as:  PERCOCET/ROXICET Take 1-2 tablets by mouth every 6 (six) hours as needed for severe pain.   potassium chloride SA 20 MEQ tablet Commonly known as:  K-DUR,KLOR-CON Take 20 mEq by mouth 2 (two) times daily.   simvastatin 20 MG tablet Commonly known as:  ZOCOR Take 20 mg by mouth every evening.  Home Infusion Instuctions        Start     Ordered   06/13/17 0000  Home infusion instructions [facility] May follow [facility] Dosing Protocol; May administer Cathflo as needed to maintain patency of vascular access device.; Flushing of vascular access device: per [facility] Protocol: 0.9% NaCl pre/post medica...    Question Answer Comment  Instructions May follow [place] Dosing Protocol   Instructions May administer Cathflo as needed to maintain patency of vascular access device.   Instructions Flushing of vascular access device: per [facility] Protocol: 0.9% NaCl pre/post medication administration and prn patency; Heparin 100 u/ml, 48m for implanted ports and Heparin 10u/ml, 549mfor all other central venous catheters.   Instructions May follow [facility] Anaphylaxis Protocol for First Dose Administration in the home: 0.9% NaCl at 25-50 ml/hr to maintain IV access for protocol meds. Epinephrine 0.3 ml IV/IM PRN and Benadryl 25-50 IV/IM PRN s/s of anaphylaxis.   Instructions [facility] Infusion Coordinator (RN) to assist per patient IV care needs in the home PRN.      06/13/17 1040     Follow-up Information    Ditty, BeKevan NyMD Follow up in 2 week(s).   Specialty:   Neurosurgery Contact information: [phone]        DiWallace GoingDO Follow up in 1 week(s).   Specialty:  Plastic Surgery Contact information: [phone]         Signed: COTraci Sermon0/26/2018, 1:43 PM

## 2017-06-13 NOTE — Progress Notes (Signed)
Neurosurgery Progress Note  No issues overnight. Having some HA this morning Continues to have lack of appetite  EXAM:  BP 128/67 (BP Location: Left Wrist)   Pulse 69   Temp 98.3 F (36.8 C) (Oral)   Resp 20   Ht 5\' 3"  (1.6 m)   Wt 124.3 kg (274 lb 0.5 oz)   SpO2 98%   BMI 48.54 kg/m   Awake, alert, oriented  Speech fluent, appropriate  MAEW with good strength  PLAN Fairly stable this am. HH has been set up. Will repeat labs this am. If labs are ok will discharge home.

## 2017-06-13 NOTE — Progress Notes (Signed)
DC instructions given to pt.  Pt verbalized understanding of all instructions.  No ss of any acute distress.  Pt transported to car via wheelchair, accompanied by daughter and nurse tech.

## 2017-06-13 NOTE — Progress Notes (Signed)
No blood return from pt's picc line.  Notified phlebotomy to draw labs.  Will place order for lab team to cathflo line.

## 2017-06-13 NOTE — Progress Notes (Signed)
Patient seen and examined Neurologically stable Wound stable I have recommended to the patient she go to a SNF at the time of discharge.  I have explained to her that I'm worried about her nutritional and medical status if she does is not under some degree of medical supervision.  She understands but wishes to go home.

## 2017-06-14 DIAGNOSIS — G9748 Accidental puncture and laceration of other nervous system organ or structure during a nervous system procedure: Secondary | ICD-10-CM | POA: Diagnosis not present

## 2017-06-14 DIAGNOSIS — Z452 Encounter for adjustment and management of vascular access device: Secondary | ICD-10-CM | POA: Diagnosis not present

## 2017-06-14 DIAGNOSIS — K219 Gastro-esophageal reflux disease without esophagitis: Secondary | ICD-10-CM | POA: Diagnosis not present

## 2017-06-14 DIAGNOSIS — M48061 Spinal stenosis, lumbar region without neurogenic claudication: Secondary | ICD-10-CM | POA: Diagnosis not present

## 2017-06-14 DIAGNOSIS — E785 Hyperlipidemia, unspecified: Secondary | ICD-10-CM | POA: Diagnosis not present

## 2017-06-14 DIAGNOSIS — I110 Hypertensive heart disease with heart failure: Secondary | ICD-10-CM | POA: Diagnosis not present

## 2017-06-14 DIAGNOSIS — J42 Unspecified chronic bronchitis: Secondary | ICD-10-CM | POA: Diagnosis not present

## 2017-06-14 DIAGNOSIS — G894 Chronic pain syndrome: Secondary | ICD-10-CM | POA: Diagnosis not present

## 2017-06-14 DIAGNOSIS — Z5181 Encounter for therapeutic drug level monitoring: Secondary | ICD-10-CM | POA: Diagnosis not present

## 2017-06-14 DIAGNOSIS — D649 Anemia, unspecified: Secondary | ICD-10-CM | POA: Diagnosis not present

## 2017-06-14 DIAGNOSIS — I5040 Unspecified combined systolic (congestive) and diastolic (congestive) heart failure: Secondary | ICD-10-CM | POA: Diagnosis not present

## 2017-06-14 DIAGNOSIS — G062 Extradural and subdural abscess, unspecified: Secondary | ICD-10-CM | POA: Diagnosis not present

## 2017-06-14 DIAGNOSIS — E43 Unspecified severe protein-calorie malnutrition: Secondary | ICD-10-CM | POA: Diagnosis not present

## 2017-06-14 DIAGNOSIS — T8131XA Disruption of external operation (surgical) wound, not elsewhere classified, initial encounter: Secondary | ICD-10-CM | POA: Diagnosis not present

## 2017-06-14 DIAGNOSIS — E662 Morbid (severe) obesity with alveolar hypoventilation: Secondary | ICD-10-CM | POA: Diagnosis not present

## 2017-06-14 DIAGNOSIS — K589 Irritable bowel syndrome without diarrhea: Secondary | ICD-10-CM | POA: Diagnosis not present

## 2017-06-15 DIAGNOSIS — I5040 Unspecified combined systolic (congestive) and diastolic (congestive) heart failure: Secondary | ICD-10-CM | POA: Diagnosis not present

## 2017-06-15 DIAGNOSIS — G062 Extradural and subdural abscess, unspecified: Secondary | ICD-10-CM | POA: Diagnosis not present

## 2017-06-15 DIAGNOSIS — T8131XA Disruption of external operation (surgical) wound, not elsewhere classified, initial encounter: Secondary | ICD-10-CM | POA: Diagnosis not present

## 2017-06-15 DIAGNOSIS — M48061 Spinal stenosis, lumbar region without neurogenic claudication: Secondary | ICD-10-CM | POA: Diagnosis not present

## 2017-06-15 DIAGNOSIS — G9748 Accidental puncture and laceration of other nervous system organ or structure during a nervous system procedure: Secondary | ICD-10-CM | POA: Diagnosis not present

## 2017-06-15 DIAGNOSIS — I110 Hypertensive heart disease with heart failure: Secondary | ICD-10-CM | POA: Diagnosis not present

## 2017-06-16 DIAGNOSIS — I5040 Unspecified combined systolic (congestive) and diastolic (congestive) heart failure: Secondary | ICD-10-CM | POA: Diagnosis not present

## 2017-06-16 DIAGNOSIS — G062 Extradural and subdural abscess, unspecified: Secondary | ICD-10-CM | POA: Diagnosis not present

## 2017-06-16 DIAGNOSIS — I110 Hypertensive heart disease with heart failure: Secondary | ICD-10-CM | POA: Diagnosis not present

## 2017-06-16 DIAGNOSIS — M48061 Spinal stenosis, lumbar region without neurogenic claudication: Secondary | ICD-10-CM | POA: Diagnosis not present

## 2017-06-16 DIAGNOSIS — T8131XA Disruption of external operation (surgical) wound, not elsewhere classified, initial encounter: Secondary | ICD-10-CM | POA: Diagnosis not present

## 2017-06-16 DIAGNOSIS — G9748 Accidental puncture and laceration of other nervous system organ or structure during a nervous system procedure: Secondary | ICD-10-CM | POA: Diagnosis not present

## 2017-06-16 DIAGNOSIS — Z792 Long term (current) use of antibiotics: Secondary | ICD-10-CM | POA: Diagnosis not present

## 2017-06-17 DIAGNOSIS — I110 Hypertensive heart disease with heart failure: Secondary | ICD-10-CM | POA: Diagnosis not present

## 2017-06-17 DIAGNOSIS — G9748 Accidental puncture and laceration of other nervous system organ or structure during a nervous system procedure: Secondary | ICD-10-CM | POA: Diagnosis not present

## 2017-06-17 DIAGNOSIS — G062 Extradural and subdural abscess, unspecified: Secondary | ICD-10-CM | POA: Diagnosis not present

## 2017-06-17 DIAGNOSIS — T8131XA Disruption of external operation (surgical) wound, not elsewhere classified, initial encounter: Secondary | ICD-10-CM | POA: Diagnosis not present

## 2017-06-17 DIAGNOSIS — I5040 Unspecified combined systolic (congestive) and diastolic (congestive) heart failure: Secondary | ICD-10-CM | POA: Diagnosis not present

## 2017-06-17 DIAGNOSIS — M48061 Spinal stenosis, lumbar region without neurogenic claudication: Secondary | ICD-10-CM | POA: Diagnosis not present

## 2017-06-18 ENCOUNTER — Other Ambulatory Visit: Payer: Self-pay | Admitting: Pharmacist

## 2017-06-18 DIAGNOSIS — G062 Extradural and subdural abscess, unspecified: Secondary | ICD-10-CM | POA: Diagnosis not present

## 2017-06-18 DIAGNOSIS — M48061 Spinal stenosis, lumbar region without neurogenic claudication: Secondary | ICD-10-CM | POA: Diagnosis not present

## 2017-06-18 DIAGNOSIS — G9748 Accidental puncture and laceration of other nervous system organ or structure during a nervous system procedure: Secondary | ICD-10-CM | POA: Diagnosis not present

## 2017-06-18 DIAGNOSIS — I5040 Unspecified combined systolic (congestive) and diastolic (congestive) heart failure: Secondary | ICD-10-CM | POA: Diagnosis not present

## 2017-06-18 DIAGNOSIS — I110 Hypertensive heart disease with heart failure: Secondary | ICD-10-CM | POA: Diagnosis not present

## 2017-06-18 DIAGNOSIS — T8131XA Disruption of external operation (surgical) wound, not elsewhere classified, initial encounter: Secondary | ICD-10-CM | POA: Diagnosis not present

## 2017-06-19 DIAGNOSIS — G062 Extradural and subdural abscess, unspecified: Secondary | ICD-10-CM | POA: Diagnosis not present

## 2017-06-19 DIAGNOSIS — I5040 Unspecified combined systolic (congestive) and diastolic (congestive) heart failure: Secondary | ICD-10-CM | POA: Diagnosis not present

## 2017-06-19 DIAGNOSIS — M48061 Spinal stenosis, lumbar region without neurogenic claudication: Secondary | ICD-10-CM | POA: Diagnosis not present

## 2017-06-19 DIAGNOSIS — I110 Hypertensive heart disease with heart failure: Secondary | ICD-10-CM | POA: Diagnosis not present

## 2017-06-19 DIAGNOSIS — G9748 Accidental puncture and laceration of other nervous system organ or structure during a nervous system procedure: Secondary | ICD-10-CM | POA: Diagnosis not present

## 2017-06-19 DIAGNOSIS — T8131XA Disruption of external operation (surgical) wound, not elsewhere classified, initial encounter: Secondary | ICD-10-CM | POA: Diagnosis not present

## 2017-06-23 DIAGNOSIS — M48061 Spinal stenosis, lumbar region without neurogenic claudication: Secondary | ICD-10-CM | POA: Diagnosis not present

## 2017-06-23 DIAGNOSIS — I5040 Unspecified combined systolic (congestive) and diastolic (congestive) heart failure: Secondary | ICD-10-CM | POA: Diagnosis not present

## 2017-06-23 DIAGNOSIS — Z5181 Encounter for therapeutic drug level monitoring: Secondary | ICD-10-CM | POA: Diagnosis not present

## 2017-06-23 DIAGNOSIS — I110 Hypertensive heart disease with heart failure: Secondary | ICD-10-CM | POA: Diagnosis not present

## 2017-06-23 DIAGNOSIS — Z792 Long term (current) use of antibiotics: Secondary | ICD-10-CM | POA: Diagnosis not present

## 2017-06-23 DIAGNOSIS — T8131XA Disruption of external operation (surgical) wound, not elsewhere classified, initial encounter: Secondary | ICD-10-CM | POA: Diagnosis not present

## 2017-06-23 DIAGNOSIS — G062 Extradural and subdural abscess, unspecified: Secondary | ICD-10-CM | POA: Diagnosis not present

## 2017-06-23 DIAGNOSIS — G9748 Accidental puncture and laceration of other nervous system organ or structure during a nervous system procedure: Secondary | ICD-10-CM | POA: Diagnosis not present

## 2017-06-24 ENCOUNTER — Telehealth: Payer: Self-pay | Admitting: Infectious Disease

## 2017-06-24 DIAGNOSIS — M4646 Discitis, unspecified, lumbar region: Secondary | ICD-10-CM

## 2017-06-24 NOTE — Telephone Encounter (Signed)
Called by [facility].  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

## 2017-06-24 NOTE — Progress Notes (Signed)
PT G-Code Note Late Entry    06/06/17 1935  PT G-Codes **NOT FOR INPATIENT CLASS**  Functional Assessment Tool Used AM-PAC 6 Clicks Basic Mobility  Functional Limitation Mobility: Walking and moving around  Mobility: Walking and Moving Around Current Status [phone]) CM  Mobility: Walking and Moving Around Goal Status [phone]) [place], PT [phone] 06/24/2017

## 2017-06-25 DIAGNOSIS — G9748 Accidental puncture and laceration of other nervous system organ or structure during a nervous system procedure: Secondary | ICD-10-CM | POA: Diagnosis not present

## 2017-06-25 DIAGNOSIS — T8131XA Disruption of external operation (surgical) wound, not elsewhere classified, initial encounter: Secondary | ICD-10-CM | POA: Diagnosis not present

## 2017-06-25 DIAGNOSIS — I110 Hypertensive heart disease with heart failure: Secondary | ICD-10-CM | POA: Diagnosis not present

## 2017-06-25 DIAGNOSIS — G062 Extradural and subdural abscess, unspecified: Secondary | ICD-10-CM | POA: Diagnosis not present

## 2017-06-25 DIAGNOSIS — M48061 Spinal stenosis, lumbar region without neurogenic claudication: Secondary | ICD-10-CM | POA: Diagnosis not present

## 2017-06-25 DIAGNOSIS — I5040 Unspecified combined systolic (congestive) and diastolic (congestive) heart failure: Secondary | ICD-10-CM | POA: Diagnosis not present

## 2017-06-25 NOTE — Telephone Encounter (Signed)
Ok excellent

## 2017-06-25 NOTE — Telephone Encounter (Signed)
Patient going tomorrow AM to have picc line replaced. [name]

## 2017-06-26 ENCOUNTER — Encounter (HOSPITAL_COMMUNITY): Payer: Self-pay | Admitting: Physician Assistant

## 2017-06-26 ENCOUNTER — Ambulatory Visit (HOSPITAL_COMMUNITY)
Admission: RE | Admit: 2017-06-26 | Discharge: 2017-06-26 | Disposition: A | Discharge status: Home / Self Care | Payer: Medicare Other | Source: Ambulatory Visit | Attending: Infectious Disease | Admitting: Infectious Disease

## 2017-06-26 ENCOUNTER — Other Ambulatory Visit: Payer: Self-pay | Admitting: Infectious Disease

## 2017-06-26 DIAGNOSIS — M48061 Spinal stenosis, lumbar region without neurogenic claudication: Secondary | ICD-10-CM | POA: Diagnosis not present

## 2017-06-26 DIAGNOSIS — T82524A Displacement of infusion catheter, initial encounter: Secondary | ICD-10-CM | POA: Insufficient documentation

## 2017-06-26 DIAGNOSIS — G9748 Accidental puncture and laceration of other nervous system organ or structure during a nervous system procedure: Secondary | ICD-10-CM | POA: Diagnosis not present

## 2017-06-26 DIAGNOSIS — M4646 Discitis, unspecified, lumbar region: Secondary | ICD-10-CM

## 2017-06-26 DIAGNOSIS — I110 Hypertensive heart disease with heart failure: Secondary | ICD-10-CM | POA: Diagnosis not present

## 2017-06-26 DIAGNOSIS — I5040 Unspecified combined systolic (congestive) and diastolic (congestive) heart failure: Secondary | ICD-10-CM | POA: Diagnosis not present

## 2017-06-26 DIAGNOSIS — T8131XA Disruption of external operation (surgical) wound, not elsewhere classified, initial encounter: Secondary | ICD-10-CM | POA: Diagnosis not present

## 2017-06-26 DIAGNOSIS — Y713 Surgical instruments, materials and cardiovascular devices (including sutures) associated with adverse incidents: Secondary | ICD-10-CM | POA: Insufficient documentation

## 2017-06-26 DIAGNOSIS — G062 Extradural and subdural abscess, unspecified: Secondary | ICD-10-CM | POA: Diagnosis not present

## 2017-06-26 DIAGNOSIS — T82898A Other specified complication of vascular prosthetic devices, implants and grafts, initial encounter: Secondary | ICD-10-CM | POA: Diagnosis not present

## 2017-06-26 HISTORY — PX: IR FLUORO GUIDE CV MIDLINE PICC RIGHT: IMG5212

## 2017-06-26 MED ORDER — CHLORHEXIDINE GLUCONATE 4 % EX LIQD
CUTANEOUS | Status: AC
  Filled 2017-06-26: qty 15

## 2017-06-26 MED ORDER — LIDOCAINE HCL 1 % IJ SOLN
INTRAMUSCULAR | Status: AC
  Filled 2017-06-26: qty 20

## 2017-06-26 MED ORDER — LIDOCAINE HCL 1 % IJ SOLN
INTRAMUSCULAR | Status: DC | PRN
  Administered 2017-06-26: 5 mL

## 2017-06-26 MED ORDER — HEPARIN SOD (PORK) LOCK FLUSH 100 UNIT/ML IV SOLN
INTRAVENOUS | Status: AC
  Filled 2017-06-26: qty 5

## 2017-06-26 NOTE — Procedures (Signed)
Successful exchange of a single lumen PICC line to right basilic vein. Length 39 cm Tip at lower SVC/RA No complications Ready for use.  WENDY S BLAIR PA-C

## 2017-06-26 NOTE — Progress Notes (Signed)
Late entry due to missed g-code.   2017/07/07 1500  OT G-codes **NOT FOR INPATIENT CLASS**  Functional Assessment Tool Used Clinical judgement  Functional Limitation Self care  Self Care Current Status [phone]) CL  Self Care Goal Status (V7034) CJ  2017/07/07 Nestor Lewandowsky, OTR/L Pager: [phone]

## 2017-06-27 ENCOUNTER — Other Ambulatory Visit: Payer: Self-pay | Admitting: Pharmacist

## 2017-06-27 DIAGNOSIS — M48061 Spinal stenosis, lumbar region without neurogenic claudication: Secondary | ICD-10-CM | POA: Diagnosis not present

## 2017-06-27 DIAGNOSIS — G9748 Accidental puncture and laceration of other nervous system organ or structure during a nervous system procedure: Secondary | ICD-10-CM | POA: Diagnosis not present

## 2017-06-27 DIAGNOSIS — T8131XA Disruption of external operation (surgical) wound, not elsewhere classified, initial encounter: Secondary | ICD-10-CM | POA: Diagnosis not present

## 2017-06-27 DIAGNOSIS — I110 Hypertensive heart disease with heart failure: Secondary | ICD-10-CM | POA: Diagnosis not present

## 2017-06-27 DIAGNOSIS — G062 Extradural and subdural abscess, unspecified: Secondary | ICD-10-CM | POA: Diagnosis not present

## 2017-06-27 DIAGNOSIS — I5040 Unspecified combined systolic (congestive) and diastolic (congestive) heart failure: Secondary | ICD-10-CM | POA: Diagnosis not present

## 2017-07-01 DIAGNOSIS — T8131XA Disruption of external operation (surgical) wound, not elsewhere classified, initial encounter: Secondary | ICD-10-CM | POA: Diagnosis not present

## 2017-07-01 DIAGNOSIS — Z792 Long term (current) use of antibiotics: Secondary | ICD-10-CM | POA: Diagnosis not present

## 2017-07-01 DIAGNOSIS — G9748 Accidental puncture and laceration of other nervous system organ or structure during a nervous system procedure: Secondary | ICD-10-CM | POA: Diagnosis not present

## 2017-07-01 DIAGNOSIS — I110 Hypertensive heart disease with heart failure: Secondary | ICD-10-CM | POA: Diagnosis not present

## 2017-07-01 DIAGNOSIS — I5040 Unspecified combined systolic (congestive) and diastolic (congestive) heart failure: Secondary | ICD-10-CM | POA: Diagnosis not present

## 2017-07-01 DIAGNOSIS — G062 Extradural and subdural abscess, unspecified: Secondary | ICD-10-CM | POA: Diagnosis not present

## 2017-07-01 DIAGNOSIS — M48061 Spinal stenosis, lumbar region without neurogenic claudication: Secondary | ICD-10-CM | POA: Diagnosis not present

## 2017-07-02 ENCOUNTER — Other Ambulatory Visit: Payer: Self-pay | Admitting: Pharmacist

## 2017-07-03 DIAGNOSIS — I110 Hypertensive heart disease with heart failure: Secondary | ICD-10-CM | POA: Diagnosis not present

## 2017-07-03 DIAGNOSIS — I5040 Unspecified combined systolic (congestive) and diastolic (congestive) heart failure: Secondary | ICD-10-CM | POA: Diagnosis not present

## 2017-07-03 DIAGNOSIS — G9748 Accidental puncture and laceration of other nervous system organ or structure during a nervous system procedure: Secondary | ICD-10-CM | POA: Diagnosis not present

## 2017-07-03 DIAGNOSIS — T8131XA Disruption of external operation (surgical) wound, not elsewhere classified, initial encounter: Secondary | ICD-10-CM | POA: Diagnosis not present

## 2017-07-03 DIAGNOSIS — M48061 Spinal stenosis, lumbar region without neurogenic claudication: Secondary | ICD-10-CM | POA: Diagnosis not present

## 2017-07-03 DIAGNOSIS — G062 Extradural and subdural abscess, unspecified: Secondary | ICD-10-CM | POA: Diagnosis not present

## 2017-07-04 DIAGNOSIS — S31000D Unspecified open wound of lower back and pelvis without penetration into retroperitoneum, subsequent encounter: Secondary | ICD-10-CM | POA: Diagnosis not present

## 2017-07-07 ENCOUNTER — Telehealth: Payer: Self-pay | Admitting: Infectious Disease

## 2017-07-07 DIAGNOSIS — G062 Extradural and subdural abscess, unspecified: Secondary | ICD-10-CM | POA: Diagnosis not present

## 2017-07-07 DIAGNOSIS — I110 Hypertensive heart disease with heart failure: Secondary | ICD-10-CM | POA: Diagnosis not present

## 2017-07-07 DIAGNOSIS — T8131XA Disruption of external operation (surgical) wound, not elsewhere classified, initial encounter: Secondary | ICD-10-CM | POA: Diagnosis not present

## 2017-07-07 DIAGNOSIS — I5040 Unspecified combined systolic (congestive) and diastolic (congestive) heart failure: Secondary | ICD-10-CM | POA: Diagnosis not present

## 2017-07-07 DIAGNOSIS — Z792 Long term (current) use of antibiotics: Secondary | ICD-10-CM | POA: Diagnosis not present

## 2017-07-07 DIAGNOSIS — M48061 Spinal stenosis, lumbar region without neurogenic claudication: Secondary | ICD-10-CM | POA: Diagnosis not present

## 2017-07-07 DIAGNOSIS — G9748 Accidental puncture and laceration of other nervous system organ or structure during a nervous system procedure: Secondary | ICD-10-CM | POA: Diagnosis not present

## 2017-07-07 MED ORDER — CEFUROXIME AXETIL 500 MG PO TABS: 500.0000 mg | ORAL_TABLET | Freq: Two times a day (BID) | ORAL | 4 refills | Status: DC

## 2017-07-07 NOTE — Telephone Encounter (Signed)
Patients PICC becoming erythematous infected appearing per [facility]> Therefore will DC it and switch to ceftin

## 2017-07-09 ENCOUNTER — Inpatient Hospital Stay: Payer: Self-pay | Admitting: Infectious Disease

## 2017-07-09 DIAGNOSIS — G9748 Accidental puncture and laceration of other nervous system organ or structure during a nervous system procedure: Secondary | ICD-10-CM | POA: Diagnosis not present

## 2017-07-09 DIAGNOSIS — I110 Hypertensive heart disease with heart failure: Secondary | ICD-10-CM | POA: Diagnosis not present

## 2017-07-09 DIAGNOSIS — M48061 Spinal stenosis, lumbar region without neurogenic claudication: Secondary | ICD-10-CM | POA: Diagnosis not present

## 2017-07-09 DIAGNOSIS — G062 Extradural and subdural abscess, unspecified: Secondary | ICD-10-CM | POA: Diagnosis not present

## 2017-07-09 DIAGNOSIS — T8131XA Disruption of external operation (surgical) wound, not elsewhere classified, initial encounter: Secondary | ICD-10-CM | POA: Diagnosis not present

## 2017-07-09 DIAGNOSIS — I5040 Unspecified combined systolic (congestive) and diastolic (congestive) heart failure: Secondary | ICD-10-CM | POA: Diagnosis not present

## 2017-07-11 DIAGNOSIS — M48061 Spinal stenosis, lumbar region without neurogenic claudication: Secondary | ICD-10-CM | POA: Diagnosis not present

## 2017-07-11 DIAGNOSIS — G9748 Accidental puncture and laceration of other nervous system organ or structure during a nervous system procedure: Secondary | ICD-10-CM | POA: Diagnosis not present

## 2017-07-11 DIAGNOSIS — G062 Extradural and subdural abscess, unspecified: Secondary | ICD-10-CM | POA: Diagnosis not present

## 2017-07-11 DIAGNOSIS — I5040 Unspecified combined systolic (congestive) and diastolic (congestive) heart failure: Secondary | ICD-10-CM | POA: Diagnosis not present

## 2017-07-11 DIAGNOSIS — T8131XA Disruption of external operation (surgical) wound, not elsewhere classified, initial encounter: Secondary | ICD-10-CM | POA: Diagnosis not present

## 2017-07-11 DIAGNOSIS — I110 Hypertensive heart disease with heart failure: Secondary | ICD-10-CM | POA: Diagnosis not present

## 2017-07-14 DIAGNOSIS — M48061 Spinal stenosis, lumbar region without neurogenic claudication: Secondary | ICD-10-CM | POA: Diagnosis not present

## 2017-07-14 DIAGNOSIS — G9748 Accidental puncture and laceration of other nervous system organ or structure during a nervous system procedure: Secondary | ICD-10-CM | POA: Diagnosis not present

## 2017-07-14 DIAGNOSIS — T8131XA Disruption of external operation (surgical) wound, not elsewhere classified, initial encounter: Secondary | ICD-10-CM | POA: Diagnosis not present

## 2017-07-14 DIAGNOSIS — I5040 Unspecified combined systolic (congestive) and diastolic (congestive) heart failure: Secondary | ICD-10-CM | POA: Diagnosis not present

## 2017-07-14 DIAGNOSIS — G062 Extradural and subdural abscess, unspecified: Secondary | ICD-10-CM | POA: Diagnosis not present

## 2017-07-14 DIAGNOSIS — I110 Hypertensive heart disease with heart failure: Secondary | ICD-10-CM | POA: Diagnosis not present

## 2017-07-15 DIAGNOSIS — I5040 Unspecified combined systolic (congestive) and diastolic (congestive) heart failure: Secondary | ICD-10-CM | POA: Diagnosis not present

## 2017-07-15 DIAGNOSIS — G062 Extradural and subdural abscess, unspecified: Secondary | ICD-10-CM | POA: Diagnosis not present

## 2017-07-15 DIAGNOSIS — T8131XA Disruption of external operation (surgical) wound, not elsewhere classified, initial encounter: Secondary | ICD-10-CM | POA: Diagnosis not present

## 2017-07-15 DIAGNOSIS — G9748 Accidental puncture and laceration of other nervous system organ or structure during a nervous system procedure: Secondary | ICD-10-CM | POA: Diagnosis not present

## 2017-07-15 DIAGNOSIS — I110 Hypertensive heart disease with heart failure: Secondary | ICD-10-CM | POA: Diagnosis not present

## 2017-07-15 DIAGNOSIS — M48061 Spinal stenosis, lumbar region without neurogenic claudication: Secondary | ICD-10-CM | POA: Diagnosis not present

## 2017-07-16 DIAGNOSIS — G062 Extradural and subdural abscess, unspecified: Secondary | ICD-10-CM | POA: Diagnosis not present

## 2017-07-16 DIAGNOSIS — I5040 Unspecified combined systolic (congestive) and diastolic (congestive) heart failure: Secondary | ICD-10-CM | POA: Diagnosis not present

## 2017-07-16 DIAGNOSIS — G9748 Accidental puncture and laceration of other nervous system organ or structure during a nervous system procedure: Secondary | ICD-10-CM | POA: Diagnosis not present

## 2017-07-16 DIAGNOSIS — I110 Hypertensive heart disease with heart failure: Secondary | ICD-10-CM | POA: Diagnosis not present

## 2017-07-16 DIAGNOSIS — T8131XA Disruption of external operation (surgical) wound, not elsewhere classified, initial encounter: Secondary | ICD-10-CM | POA: Diagnosis not present

## 2017-07-16 DIAGNOSIS — M48061 Spinal stenosis, lumbar region without neurogenic claudication: Secondary | ICD-10-CM | POA: Diagnosis not present

## 2017-07-17 DIAGNOSIS — G9748 Accidental puncture and laceration of other nervous system organ or structure during a nervous system procedure: Secondary | ICD-10-CM | POA: Diagnosis not present

## 2017-07-17 DIAGNOSIS — T8131XA Disruption of external operation (surgical) wound, not elsewhere classified, initial encounter: Secondary | ICD-10-CM | POA: Diagnosis not present

## 2017-07-17 DIAGNOSIS — I110 Hypertensive heart disease with heart failure: Secondary | ICD-10-CM | POA: Diagnosis not present

## 2017-07-17 DIAGNOSIS — M48061 Spinal stenosis, lumbar region without neurogenic claudication: Secondary | ICD-10-CM | POA: Diagnosis not present

## 2017-07-17 DIAGNOSIS — I5040 Unspecified combined systolic (congestive) and diastolic (congestive) heart failure: Secondary | ICD-10-CM | POA: Diagnosis not present

## 2017-07-17 DIAGNOSIS — G062 Extradural and subdural abscess, unspecified: Secondary | ICD-10-CM | POA: Diagnosis not present

## 2017-07-22 DIAGNOSIS — M48061 Spinal stenosis, lumbar region without neurogenic claudication: Secondary | ICD-10-CM | POA: Diagnosis not present

## 2017-07-22 DIAGNOSIS — I5040 Unspecified combined systolic (congestive) and diastolic (congestive) heart failure: Secondary | ICD-10-CM | POA: Diagnosis not present

## 2017-07-22 DIAGNOSIS — G062 Extradural and subdural abscess, unspecified: Secondary | ICD-10-CM | POA: Diagnosis not present

## 2017-07-22 DIAGNOSIS — T8131XA Disruption of external operation (surgical) wound, not elsewhere classified, initial encounter: Secondary | ICD-10-CM | POA: Diagnosis not present

## 2017-07-22 DIAGNOSIS — I110 Hypertensive heart disease with heart failure: Secondary | ICD-10-CM | POA: Diagnosis not present

## 2017-07-22 DIAGNOSIS — G9748 Accidental puncture and laceration of other nervous system organ or structure during a nervous system procedure: Secondary | ICD-10-CM | POA: Diagnosis not present

## 2017-07-23 ENCOUNTER — Encounter: Payer: Self-pay | Admitting: Infectious Disease

## 2017-07-23 ENCOUNTER — Ambulatory Visit (INDEPENDENT_AMBULATORY_CARE_PROVIDER_SITE_OTHER): Payer: Medicare Other | Admitting: Infectious Disease

## 2017-07-23 VITALS — BP 172/105 | HR 78 | Temp 98.0°F | Wt 257.0 lb

## 2017-07-23 DIAGNOSIS — I5040 Unspecified combined systolic (congestive) and diastolic (congestive) heart failure: Secondary | ICD-10-CM | POA: Diagnosis not present

## 2017-07-23 DIAGNOSIS — M48061 Spinal stenosis, lumbar region without neurogenic claudication: Secondary | ICD-10-CM | POA: Diagnosis not present

## 2017-07-23 DIAGNOSIS — T847XXD Infection and inflammatory reaction due to other internal orthopedic prosthetic devices, implants and grafts, subsequent encounter: Secondary | ICD-10-CM | POA: Diagnosis not present

## 2017-07-23 DIAGNOSIS — G9600 Cerebrospinal fluid leak, unspecified: Secondary | ICD-10-CM

## 2017-07-23 DIAGNOSIS — T8149XA Infection following a procedure, other surgical site, initial encounter: Secondary | ICD-10-CM

## 2017-07-23 DIAGNOSIS — M4647 Discitis, unspecified, lumbosacral region: Secondary | ICD-10-CM

## 2017-07-23 DIAGNOSIS — G061 Intraspinal abscess and granuloma: Secondary | ICD-10-CM | POA: Diagnosis not present

## 2017-07-23 DIAGNOSIS — T8131XA Disruption of external operation (surgical) wound, not elsewhere classified, initial encounter: Secondary | ICD-10-CM | POA: Diagnosis not present

## 2017-07-23 DIAGNOSIS — A499 Bacterial infection, unspecified: Secondary | ICD-10-CM | POA: Diagnosis not present

## 2017-07-23 DIAGNOSIS — G062 Extradural and subdural abscess, unspecified: Secondary | ICD-10-CM

## 2017-07-23 DIAGNOSIS — G9748 Accidental puncture and laceration of other nervous system organ or structure during a nervous system procedure: Secondary | ICD-10-CM | POA: Diagnosis not present

## 2017-07-23 DIAGNOSIS — I110 Hypertensive heart disease with heart failure: Secondary | ICD-10-CM | POA: Diagnosis not present
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
## 2017-07-24 LAB — CBC WITH DIFFERENTIAL/PLATELET
BASOS ABS: 31 {cells}/uL (ref 0–200)
Basophils Relative: 0.9 %
Eosinophils Absolute: 99 cells/uL (ref 15–500)
Eosinophils Relative: 2.9 %
HCT: 36.6 % (ref 35.0–45.0)
Hemoglobin: 11.7 g/dL (ref 11.7–15.5)
Lymphs Abs: 1700 cells/uL (ref 850–3900)
MCH: 25 pg — AB (ref 27.0–33.0)
MCHC: 32 g/dL (ref 32.0–36.0)
MCV: 78.2 fL — AB (ref 80.0–100.0)
MONOS PCT: 17.7 %
MPV: 11.2 fL (ref 7.5–12.5)
NEUTROS ABS: 969 {cells}/uL — AB (ref 1500–7800)
NEUTROS PCT: 28.5 %
PLATELETS: 243 10*3/uL (ref 140–400)
RBC: 4.68 10*6/uL (ref 3.80–5.10)
RDW: 15.3 % — AB (ref 11.0–15.0)
TOTAL LYMPHOCYTE: 50 %
WBC mixed population: 602 cells/uL (ref 200–950)
WBC: 3.4 10*3/uL — ABNORMAL LOW (ref 3.8–10.8)

## 2017-07-24 LAB — BASIC METABOLIC PANEL WITH GFR
BUN / CREAT RATIO: 6 (calc) (ref 6–22)
BUN: 5 mg/dL — ABNORMAL LOW (ref 7–25)
CALCIUM: 8.9 mg/dL (ref 8.6–10.4)
CO2: 25 mmol/L (ref 20–32)
Chloride: 105 mmol/L (ref 98–110)
Creat: 0.89 mg/dL (ref 0.50–0.99)
GFR, EST AFRICAN AMERICAN: 77 mL/min/{1.73_m2} (ref >=60)
GFR, Est Non African American: 67 mL/min/{1.73_m2} (ref >=60)
Glucose, Bld: 113 mg/dL — ABNORMAL HIGH (ref 65–99)
POTASSIUM: 4.1 mmol/L (ref 3.5–5.3)
SODIUM: 140 mmol/L (ref 135–146)

## 2017-07-24 LAB — SEDIMENTATION RATE: SED RATE: 55 mm/h — AB (ref 0–30)

## 2017-07-24 LAB — C-REACTIVE PROTEIN: CRP: 9.3 mg/L — AB (ref <8.0)

## 2017-07-30 DIAGNOSIS — M48061 Spinal stenosis, lumbar region without neurogenic claudication: Secondary | ICD-10-CM | POA: Diagnosis not present

## 2017-07-30 DIAGNOSIS — I110 Hypertensive heart disease with heart failure: Secondary | ICD-10-CM | POA: Diagnosis not present

## 2017-07-30 DIAGNOSIS — G062 Extradural and subdural abscess, unspecified: Secondary | ICD-10-CM | POA: Diagnosis not present

## 2017-07-30 DIAGNOSIS — I5040 Unspecified combined systolic (congestive) and diastolic (congestive) heart failure: Secondary | ICD-10-CM | POA: Diagnosis not present

## 2017-07-30 DIAGNOSIS — T8131XA Disruption of external operation (surgical) wound, not elsewhere classified, initial encounter: Secondary | ICD-10-CM | POA: Diagnosis not present

## 2017-07-30 DIAGNOSIS — G9748 Accidental puncture and laceration of other nervous system organ or structure during a nervous system procedure: Secondary | ICD-10-CM | POA: Diagnosis not present

## 2017-07-31 DIAGNOSIS — T8131XA Disruption of external operation (surgical) wound, not elsewhere classified, initial encounter: Secondary | ICD-10-CM | POA: Diagnosis not present

## 2017-07-31 DIAGNOSIS — G9748 Accidental puncture and laceration of other nervous system organ or structure during a nervous system procedure: Secondary | ICD-10-CM | POA: Diagnosis not present

## 2017-07-31 DIAGNOSIS — M48061 Spinal stenosis, lumbar region without neurogenic claudication: Secondary | ICD-10-CM | POA: Diagnosis not present

## 2017-07-31 DIAGNOSIS — I110 Hypertensive heart disease with heart failure: Secondary | ICD-10-CM | POA: Diagnosis not present

## 2017-07-31 DIAGNOSIS — G062 Extradural and subdural abscess, unspecified: Secondary | ICD-10-CM | POA: Diagnosis not present

## 2017-07-31 DIAGNOSIS — I5040 Unspecified combined systolic (congestive) and diastolic (congestive) heart failure: Secondary | ICD-10-CM | POA: Diagnosis not present

## 2017-08-01 DIAGNOSIS — R7301 Impaired fasting glucose: Secondary | ICD-10-CM | POA: Diagnosis not present

## 2017-08-01 DIAGNOSIS — I10 Essential (primary) hypertension: Secondary | ICD-10-CM | POA: Diagnosis not present

## 2017-08-01 DIAGNOSIS — G062 Extradural and subdural abscess, unspecified: Secondary | ICD-10-CM | POA: Diagnosis not present

## 2017-08-01 DIAGNOSIS — Z6841 Body Mass Index (BMI) 40.0 and over, adult: Secondary | ICD-10-CM | POA: Diagnosis not present

## 2017-08-01 DIAGNOSIS — M545 Low back pain: Secondary | ICD-10-CM | POA: Diagnosis not present

## 2017-08-05 DIAGNOSIS — G9748 Accidental puncture and laceration of other nervous system organ or structure during a nervous system procedure: Secondary | ICD-10-CM | POA: Diagnosis not present

## 2017-08-05 DIAGNOSIS — I5040 Unspecified combined systolic (congestive) and diastolic (congestive) heart failure: Secondary | ICD-10-CM | POA: Diagnosis not present

## 2017-08-05 DIAGNOSIS — G062 Extradural and subdural abscess, unspecified: Secondary | ICD-10-CM | POA: Diagnosis not present

## 2017-08-05 DIAGNOSIS — I110 Hypertensive heart disease with heart failure: Secondary | ICD-10-CM | POA: Diagnosis not present

## 2017-08-05 DIAGNOSIS — M48061 Spinal stenosis, lumbar region without neurogenic claudication: Secondary | ICD-10-CM | POA: Diagnosis not present

## 2017-08-05 DIAGNOSIS — T8131XA Disruption of external operation (surgical) wound, not elsewhere classified, initial encounter: Secondary | ICD-10-CM | POA: Diagnosis not present

## 2017-08-07 DIAGNOSIS — I5040 Unspecified combined systolic (congestive) and diastolic (congestive) heart failure: Secondary | ICD-10-CM | POA: Diagnosis not present

## 2017-08-07 DIAGNOSIS — M48061 Spinal stenosis, lumbar region without neurogenic claudication: Secondary | ICD-10-CM | POA: Diagnosis not present

## 2017-08-07 DIAGNOSIS — G9748 Accidental puncture and laceration of other nervous system organ or structure during a nervous system procedure: Secondary | ICD-10-CM | POA: Diagnosis not present

## 2017-08-07 DIAGNOSIS — T8131XA Disruption of external operation (surgical) wound, not elsewhere classified, initial encounter: Secondary | ICD-10-CM | POA: Diagnosis not present

## 2017-08-07 DIAGNOSIS — G062 Extradural and subdural abscess, unspecified: Secondary | ICD-10-CM | POA: Diagnosis not present

## 2017-08-07 DIAGNOSIS — I110 Hypertensive heart disease with heart failure: Secondary | ICD-10-CM | POA: Diagnosis not present

## 2017-08-10 DIAGNOSIS — M48061 Spinal stenosis, lumbar region without neurogenic claudication: Secondary | ICD-10-CM | POA: Diagnosis not present

## 2017-08-10 DIAGNOSIS — G9748 Accidental puncture and laceration of other nervous system organ or structure during a nervous system procedure: Secondary | ICD-10-CM | POA: Diagnosis not present

## 2017-08-10 DIAGNOSIS — I5040 Unspecified combined systolic (congestive) and diastolic (congestive) heart failure: Secondary | ICD-10-CM | POA: Diagnosis not present

## 2017-08-10 DIAGNOSIS — I110 Hypertensive heart disease with heart failure: Secondary | ICD-10-CM | POA: Diagnosis not present

## 2017-08-10 DIAGNOSIS — T8131XA Disruption of external operation (surgical) wound, not elsewhere classified, initial encounter: Secondary | ICD-10-CM | POA: Diagnosis not present

## 2017-08-10 DIAGNOSIS — G062 Extradural and subdural abscess, unspecified: Secondary | ICD-10-CM | POA: Diagnosis not present

## 2017-08-13 DIAGNOSIS — I5040 Unspecified combined systolic (congestive) and diastolic (congestive) heart failure: Secondary | ICD-10-CM | POA: Diagnosis not present

## 2017-08-13 DIAGNOSIS — G894 Chronic pain syndrome: Secondary | ICD-10-CM | POA: Diagnosis not present

## 2017-08-13 DIAGNOSIS — G9748 Accidental puncture and laceration of other nervous system organ or structure during a nervous system procedure: Secondary | ICD-10-CM | POA: Diagnosis not present

## 2017-08-13 DIAGNOSIS — Z5181 Encounter for therapeutic drug level monitoring: Secondary | ICD-10-CM | POA: Diagnosis not present

## 2017-08-13 DIAGNOSIS — K219 Gastro-esophageal reflux disease without esophagitis: Secondary | ICD-10-CM | POA: Diagnosis not present

## 2017-08-13 DIAGNOSIS — Z452 Encounter for adjustment and management of vascular access device: Secondary | ICD-10-CM | POA: Diagnosis not present

## 2017-08-13 DIAGNOSIS — I110 Hypertensive heart disease with heart failure: Secondary | ICD-10-CM | POA: Diagnosis not present

## 2017-08-13 DIAGNOSIS — E785 Hyperlipidemia, unspecified: Secondary | ICD-10-CM | POA: Diagnosis not present

## 2017-08-13 DIAGNOSIS — T8131XA Disruption of external operation (surgical) wound, not elsewhere classified, initial encounter: Secondary | ICD-10-CM | POA: Diagnosis not present

## 2017-08-13 DIAGNOSIS — K589 Irritable bowel syndrome without diarrhea: Secondary | ICD-10-CM | POA: Diagnosis not present

## 2017-08-13 DIAGNOSIS — D649 Anemia, unspecified: Secondary | ICD-10-CM | POA: Diagnosis not present

## 2017-08-13 DIAGNOSIS — M48061 Spinal stenosis, lumbar region without neurogenic claudication: Secondary | ICD-10-CM | POA: Diagnosis not present

## 2017-08-13 DIAGNOSIS — E43 Unspecified severe protein-calorie malnutrition: Secondary | ICD-10-CM | POA: Diagnosis not present

## 2017-08-13 DIAGNOSIS — E662 Morbid (severe) obesity with alveolar hypoventilation: Secondary | ICD-10-CM | POA: Diagnosis not present

## 2017-08-13 DIAGNOSIS — J42 Unspecified chronic bronchitis: Secondary | ICD-10-CM | POA: Diagnosis not present

## 2017-08-13 DIAGNOSIS — G062 Extradural and subdural abscess, unspecified: Secondary | ICD-10-CM | POA: Diagnosis not present

## 2017-08-14 DIAGNOSIS — I110 Hypertensive heart disease with heart failure: Secondary | ICD-10-CM | POA: Diagnosis not present

## 2017-08-14 DIAGNOSIS — M48061 Spinal stenosis, lumbar region without neurogenic claudication: Secondary | ICD-10-CM | POA: Diagnosis not present

## 2017-08-14 DIAGNOSIS — G9748 Accidental puncture and laceration of other nervous system organ or structure during a nervous system procedure: Secondary | ICD-10-CM | POA: Diagnosis not present

## 2017-08-14 DIAGNOSIS — G062 Extradural and subdural abscess, unspecified: Secondary | ICD-10-CM | POA: Diagnosis not present

## 2017-08-14 DIAGNOSIS — I5040 Unspecified combined systolic (congestive) and diastolic (congestive) heart failure: Secondary | ICD-10-CM | POA: Diagnosis not present

## 2017-08-14 DIAGNOSIS — T8131XA Disruption of external operation (surgical) wound, not elsewhere classified, initial encounter: Secondary | ICD-10-CM | POA: Diagnosis not present

## 2017-08-15 DIAGNOSIS — I5040 Unspecified combined systolic (congestive) and diastolic (congestive) heart failure: Secondary | ICD-10-CM | POA: Diagnosis not present

## 2017-08-15 DIAGNOSIS — G9748 Accidental puncture and laceration of other nervous system organ or structure during a nervous system procedure: Secondary | ICD-10-CM | POA: Diagnosis not present

## 2017-08-15 DIAGNOSIS — M48061 Spinal stenosis, lumbar region without neurogenic claudication: Secondary | ICD-10-CM | POA: Diagnosis not present

## 2017-08-15 DIAGNOSIS — T8131XA Disruption of external operation (surgical) wound, not elsewhere classified, initial encounter: Secondary | ICD-10-CM | POA: Diagnosis not present

## 2017-08-15 DIAGNOSIS — G062 Extradural and subdural abscess, unspecified: Secondary | ICD-10-CM | POA: Diagnosis not present

## 2017-08-15 DIAGNOSIS — I110 Hypertensive heart disease with heart failure: Secondary | ICD-10-CM | POA: Diagnosis not present

## 2017-08-19 DIAGNOSIS — I110 Hypertensive heart disease with heart failure: Secondary | ICD-10-CM | POA: Diagnosis not present

## 2017-08-19 DIAGNOSIS — G9748 Accidental puncture and laceration of other nervous system organ or structure during a nervous system procedure: Secondary | ICD-10-CM | POA: Diagnosis not present

## 2017-08-19 DIAGNOSIS — T8131XA Disruption of external operation (surgical) wound, not elsewhere classified, initial encounter: Secondary | ICD-10-CM | POA: Diagnosis not present

## 2017-08-19 DIAGNOSIS — G062 Extradural and subdural abscess, unspecified: Secondary | ICD-10-CM | POA: Diagnosis not present

## 2017-08-19 DIAGNOSIS — I5040 Unspecified combined systolic (congestive) and diastolic (congestive) heart failure: Secondary | ICD-10-CM | POA: Diagnosis not present

## 2017-08-19 DIAGNOSIS — M48061 Spinal stenosis, lumbar region without neurogenic claudication: Secondary | ICD-10-CM | POA: Diagnosis not present

## 2017-08-20 DIAGNOSIS — G062 Extradural and subdural abscess, unspecified: Secondary | ICD-10-CM | POA: Diagnosis not present

## 2017-08-20 DIAGNOSIS — T8131XA Disruption of external operation (surgical) wound, not elsewhere classified, initial encounter: Secondary | ICD-10-CM | POA: Diagnosis not present

## 2017-08-20 DIAGNOSIS — I5040 Unspecified combined systolic (congestive) and diastolic (congestive) heart failure: Secondary | ICD-10-CM | POA: Diagnosis not present

## 2017-08-20 DIAGNOSIS — M48061 Spinal stenosis, lumbar region without neurogenic claudication: Secondary | ICD-10-CM | POA: Diagnosis not present

## 2017-08-20 DIAGNOSIS — G9748 Accidental puncture and laceration of other nervous system organ or structure during a nervous system procedure: Secondary | ICD-10-CM | POA: Diagnosis not present

## 2017-08-20 DIAGNOSIS — I110 Hypertensive heart disease with heart failure: Secondary | ICD-10-CM | POA: Diagnosis not present

## 2017-08-21 DIAGNOSIS — T8131XA Disruption of external operation (surgical) wound, not elsewhere classified, initial encounter: Secondary | ICD-10-CM | POA: Diagnosis not present

## 2017-08-21 DIAGNOSIS — I110 Hypertensive heart disease with heart failure: Secondary | ICD-10-CM | POA: Diagnosis not present

## 2017-08-21 DIAGNOSIS — I5040 Unspecified combined systolic (congestive) and diastolic (congestive) heart failure: Secondary | ICD-10-CM | POA: Diagnosis not present

## 2017-08-21 DIAGNOSIS — M48061 Spinal stenosis, lumbar region without neurogenic claudication: Secondary | ICD-10-CM | POA: Diagnosis not present

## 2017-08-21 DIAGNOSIS — G062 Extradural and subdural abscess, unspecified: Secondary | ICD-10-CM | POA: Diagnosis not present

## 2017-08-21 DIAGNOSIS — G9748 Accidental puncture and laceration of other nervous system organ or structure during a nervous system procedure: Secondary | ICD-10-CM | POA: Diagnosis not present

## 2017-08-22 DIAGNOSIS — I110 Hypertensive heart disease with heart failure: Secondary | ICD-10-CM | POA: Diagnosis not present

## 2017-08-22 DIAGNOSIS — T8131XA Disruption of external operation (surgical) wound, not elsewhere classified, initial encounter: Secondary | ICD-10-CM | POA: Diagnosis not present

## 2017-08-22 DIAGNOSIS — G062 Extradural and subdural abscess, unspecified: Secondary | ICD-10-CM | POA: Diagnosis not present

## 2017-08-22 DIAGNOSIS — M48061 Spinal stenosis, lumbar region without neurogenic claudication: Secondary | ICD-10-CM | POA: Diagnosis not present

## 2017-08-22 DIAGNOSIS — G9748 Accidental puncture and laceration of other nervous system organ or structure during a nervous system procedure: Secondary | ICD-10-CM | POA: Diagnosis not present

## 2017-08-22 DIAGNOSIS — I5040 Unspecified combined systolic (congestive) and diastolic (congestive) heart failure: Secondary | ICD-10-CM | POA: Diagnosis not present

## 2017-08-25 DIAGNOSIS — T8131XA Disruption of external operation (surgical) wound, not elsewhere classified, initial encounter: Secondary | ICD-10-CM | POA: Diagnosis not present

## 2017-08-25 DIAGNOSIS — S21209D Unspecified open wound of unspecified back wall of thorax without penetration into thoracic cavity, subsequent encounter: Secondary | ICD-10-CM | POA: Diagnosis not present

## 2017-08-25 DIAGNOSIS — I110 Hypertensive heart disease with heart failure: Secondary | ICD-10-CM | POA: Diagnosis not present

## 2017-08-25 DIAGNOSIS — G9748 Accidental puncture and laceration of other nervous system organ or structure during a nervous system procedure: Secondary | ICD-10-CM | POA: Diagnosis not present

## 2017-08-25 DIAGNOSIS — G062 Extradural and subdural abscess, unspecified: Secondary | ICD-10-CM | POA: Diagnosis not present

## 2017-08-25 DIAGNOSIS — M48061 Spinal stenosis, lumbar region without neurogenic claudication: Secondary | ICD-10-CM | POA: Diagnosis not present

## 2017-08-25 DIAGNOSIS — I5040 Unspecified combined systolic (congestive) and diastolic (congestive) heart failure: Secondary | ICD-10-CM | POA: Diagnosis not present

## 2017-08-26 DIAGNOSIS — I110 Hypertensive heart disease with heart failure: Secondary | ICD-10-CM | POA: Diagnosis not present

## 2017-08-26 DIAGNOSIS — I5040 Unspecified combined systolic (congestive) and diastolic (congestive) heart failure: Secondary | ICD-10-CM | POA: Diagnosis not present

## 2017-08-26 DIAGNOSIS — T8131XA Disruption of external operation (surgical) wound, not elsewhere classified, initial encounter: Secondary | ICD-10-CM | POA: Diagnosis not present

## 2017-08-26 DIAGNOSIS — G9748 Accidental puncture and laceration of other nervous system organ or structure during a nervous system procedure: Secondary | ICD-10-CM | POA: Diagnosis not present

## 2017-08-26 DIAGNOSIS — G062 Extradural and subdural abscess, unspecified: Secondary | ICD-10-CM | POA: Diagnosis not present

## 2017-08-26 DIAGNOSIS — M48061 Spinal stenosis, lumbar region without neurogenic claudication: Secondary | ICD-10-CM | POA: Diagnosis not present

## 2017-08-27 DIAGNOSIS — T8131XA Disruption of external operation (surgical) wound, not elsewhere classified, initial encounter: Secondary | ICD-10-CM | POA: Diagnosis not present

## 2017-08-27 DIAGNOSIS — G062 Extradural and subdural abscess, unspecified: Secondary | ICD-10-CM | POA: Diagnosis not present

## 2017-08-27 DIAGNOSIS — I110 Hypertensive heart disease with heart failure: Secondary | ICD-10-CM | POA: Diagnosis not present

## 2017-08-27 DIAGNOSIS — I5040 Unspecified combined systolic (congestive) and diastolic (congestive) heart failure: Secondary | ICD-10-CM | POA: Diagnosis not present

## 2017-08-27 DIAGNOSIS — G9748 Accidental puncture and laceration of other nervous system organ or structure during a nervous system procedure: Secondary | ICD-10-CM | POA: Diagnosis not present

## 2017-08-27 DIAGNOSIS — M48061 Spinal stenosis, lumbar region without neurogenic claudication: Secondary | ICD-10-CM | POA: Diagnosis not present

## 2017-08-28 DIAGNOSIS — T8131XA Disruption of external operation (surgical) wound, not elsewhere classified, initial encounter: Secondary | ICD-10-CM | POA: Diagnosis not present

## 2017-08-28 DIAGNOSIS — I110 Hypertensive heart disease with heart failure: Secondary | ICD-10-CM | POA: Diagnosis not present

## 2017-08-28 DIAGNOSIS — M48061 Spinal stenosis, lumbar region without neurogenic claudication: Secondary | ICD-10-CM | POA: Diagnosis not present

## 2017-08-28 DIAGNOSIS — G9748 Accidental puncture and laceration of other nervous system organ or structure during a nervous system procedure: Secondary | ICD-10-CM | POA: Diagnosis not present

## 2017-08-28 DIAGNOSIS — G062 Extradural and subdural abscess, unspecified: Secondary | ICD-10-CM | POA: Diagnosis not present

## 2017-08-28 DIAGNOSIS — I5040 Unspecified combined systolic (congestive) and diastolic (congestive) heart failure: Secondary | ICD-10-CM | POA: Diagnosis not present

## 2017-08-29 DIAGNOSIS — M48061 Spinal stenosis, lumbar region without neurogenic claudication: Secondary | ICD-10-CM | POA: Diagnosis not present

## 2017-08-29 DIAGNOSIS — G062 Extradural and subdural abscess, unspecified: Secondary | ICD-10-CM | POA: Diagnosis not present

## 2017-08-29 DIAGNOSIS — I110 Hypertensive heart disease with heart failure: Secondary | ICD-10-CM | POA: Diagnosis not present

## 2017-08-29 DIAGNOSIS — G9748 Accidental puncture and laceration of other nervous system organ or structure during a nervous system procedure: Secondary | ICD-10-CM | POA: Diagnosis not present

## 2017-08-29 DIAGNOSIS — T8131XA Disruption of external operation (surgical) wound, not elsewhere classified, initial encounter: Secondary | ICD-10-CM | POA: Diagnosis not present

## 2017-08-29 DIAGNOSIS — I5040 Unspecified combined systolic (congestive) and diastolic (congestive) heart failure: Secondary | ICD-10-CM | POA: Diagnosis not present

## 2017-09-01 ENCOUNTER — Encounter: Payer: Self-pay | Admitting: Infectious Disease

## 2017-09-01 ENCOUNTER — Ambulatory Visit (INDEPENDENT_AMBULATORY_CARE_PROVIDER_SITE_OTHER): Payer: Medicare Other | Admitting: Infectious Disease

## 2017-09-01 VITALS — BP 161/64 | HR 65 | Temp 97.7°F | Wt 252.0 lb

## 2017-09-01 DIAGNOSIS — A499 Bacterial infection, unspecified: Secondary | ICD-10-CM | POA: Diagnosis present

## 2017-09-01 DIAGNOSIS — M4647 Discitis, unspecified, lumbosacral region: Secondary | ICD-10-CM

## 2017-09-01 DIAGNOSIS — G9600 Cerebrospinal fluid leak, unspecified: Secondary | ICD-10-CM

## 2017-09-01 DIAGNOSIS — G062 Extradural and subdural abscess, unspecified: Secondary | ICD-10-CM | POA: Diagnosis not present

## 2017-09-01 DIAGNOSIS — T8149XA Infection following a procedure, other surgical site, initial encounter: Secondary | ICD-10-CM

## 2017-09-01 DIAGNOSIS — A498 Other bacterial infections of unspecified site: Secondary | ICD-10-CM

## 2017-09-01 DIAGNOSIS — L304 Erythema intertrigo: Secondary | ICD-10-CM

## 2017-09-01 DIAGNOSIS — G061 Intraspinal abscess and granuloma: Secondary | ICD-10-CM | POA: Diagnosis not present

## 2017-09-01 DIAGNOSIS — Z981 Arthrodesis status: Secondary | ICD-10-CM

## 2017-09-01 DIAGNOSIS — Z889 Allergy status to unspecified drugs, medicaments and biological substances status: Secondary | ICD-10-CM

## 2017-09-01 HISTORY — DX: Erythema intertrigo: L30.4

## 2017-09-01 MED ORDER — NYSTATIN 100000 UNIT/GM EX POWD: Freq: Four times a day (QID) | CUTANEOUS | 0 refills | Status: DC

## 2017-09-01 MED ORDER — FLUCONAZOLE 100 MG PO TABS: 100.0000 mg | ORAL_TABLET | Freq: Every day | ORAL | 4 refills | Status: DC

## 2017-09-01 NOTE — Patient Instructions (Signed)
STart fluconazole 100mg  daily x 2 weeks  STOP your simvastatin (zocor) while taking the fluconazole, talk to your PCP re potential alternative to zocor, such as crestor  IF your rash improves then let me know and restart the ceftin  Let me know if it does not improve  We will check blood work today  RTC  2 months  YOu can also apply the nystatin powder to these areas of irritation four times a day
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
## 2017-09-02 DIAGNOSIS — M48061 Spinal stenosis, lumbar region without neurogenic claudication: Secondary | ICD-10-CM | POA: Diagnosis not present

## 2017-09-02 DIAGNOSIS — I5040 Unspecified combined systolic (congestive) and diastolic (congestive) heart failure: Secondary | ICD-10-CM | POA: Diagnosis not present

## 2017-09-02 DIAGNOSIS — G9748 Accidental puncture and laceration of other nervous system organ or structure during a nervous system procedure: Secondary | ICD-10-CM | POA: Diagnosis not present

## 2017-09-02 DIAGNOSIS — T8131XA Disruption of external operation (surgical) wound, not elsewhere classified, initial encounter: Secondary | ICD-10-CM | POA: Diagnosis not present

## 2017-09-02 DIAGNOSIS — I110 Hypertensive heart disease with heart failure: Secondary | ICD-10-CM | POA: Diagnosis not present

## 2017-09-02 DIAGNOSIS — G062 Extradural and subdural abscess, unspecified: Secondary | ICD-10-CM | POA: Diagnosis not present

## 2017-09-02 LAB — C-REACTIVE PROTEIN: CRP: 6.2 mg/L (ref <8.0)

## 2017-09-02 LAB — BASIC METABOLIC PANEL WITH GFR
BUN / CREAT RATIO: 8 (calc) (ref 6–22)
BUN: 8 mg/dL (ref 7–25)
CHLORIDE: 105 mmol/L (ref 98–110)
CO2: 26 mmol/L (ref 20–32)
Calcium: 9 mg/dL (ref 8.6–10.4)
Creat: 1 mg/dL — ABNORMAL HIGH (ref 0.50–0.99)
GFR, EST AFRICAN AMERICAN: 67 mL/min/{1.73_m2} (ref >=60)
GFR, Est Non African American: 58 mL/min/{1.73_m2} — ABNORMAL LOW (ref >=60)
Glucose, Bld: 92 mg/dL (ref 65–99)
POTASSIUM: 4.3 mmol/L (ref 3.5–5.3)
Sodium: 139 mmol/L (ref 135–146)

## 2017-09-02 LAB — SEDIMENTATION RATE: SED RATE: 41 mm/h — AB (ref 0–30)

## 2017-09-03 DIAGNOSIS — T8131XA Disruption of external operation (surgical) wound, not elsewhere classified, initial encounter: Secondary | ICD-10-CM | POA: Diagnosis not present

## 2017-09-03 DIAGNOSIS — I5040 Unspecified combined systolic (congestive) and diastolic (congestive) heart failure: Secondary | ICD-10-CM | POA: Diagnosis not present

## 2017-09-03 DIAGNOSIS — I110 Hypertensive heart disease with heart failure: Secondary | ICD-10-CM | POA: Diagnosis not present

## 2017-09-03 DIAGNOSIS — G9748 Accidental puncture and laceration of other nervous system organ or structure during a nervous system procedure: Secondary | ICD-10-CM | POA: Diagnosis not present

## 2017-09-03 DIAGNOSIS — M48061 Spinal stenosis, lumbar region without neurogenic claudication: Secondary | ICD-10-CM | POA: Diagnosis not present

## 2017-09-03 DIAGNOSIS — G062 Extradural and subdural abscess, unspecified: Secondary | ICD-10-CM | POA: Diagnosis not present

## 2017-09-04 DIAGNOSIS — T8131XA Disruption of external operation (surgical) wound, not elsewhere classified, initial encounter: Secondary | ICD-10-CM | POA: Diagnosis not present

## 2017-09-04 DIAGNOSIS — M48061 Spinal stenosis, lumbar region without neurogenic claudication: Secondary | ICD-10-CM | POA: Diagnosis not present

## 2017-09-04 DIAGNOSIS — I110 Hypertensive heart disease with heart failure: Secondary | ICD-10-CM | POA: Diagnosis not present

## 2017-09-04 DIAGNOSIS — G9748 Accidental puncture and laceration of other nervous system organ or structure during a nervous system procedure: Secondary | ICD-10-CM | POA: Diagnosis not present

## 2017-09-04 DIAGNOSIS — G062 Extradural and subdural abscess, unspecified: Secondary | ICD-10-CM | POA: Diagnosis not present

## 2017-09-04 DIAGNOSIS — I5040 Unspecified combined systolic (congestive) and diastolic (congestive) heart failure: Secondary | ICD-10-CM | POA: Diagnosis not present

## 2017-09-10 ENCOUNTER — Ambulatory Visit: Payer: Self-pay | Admitting: Infectious Disease

## 2017-09-10 DIAGNOSIS — G062 Extradural and subdural abscess, unspecified: Secondary | ICD-10-CM | POA: Diagnosis not present

## 2017-09-10 DIAGNOSIS — I5040 Unspecified combined systolic (congestive) and diastolic (congestive) heart failure: Secondary | ICD-10-CM | POA: Diagnosis not present

## 2017-09-10 DIAGNOSIS — G9748 Accidental puncture and laceration of other nervous system organ or structure during a nervous system procedure: Secondary | ICD-10-CM | POA: Diagnosis not present

## 2017-09-10 DIAGNOSIS — T8131XA Disruption of external operation (surgical) wound, not elsewhere classified, initial encounter: Secondary | ICD-10-CM | POA: Diagnosis not present

## 2017-09-10 DIAGNOSIS — I110 Hypertensive heart disease with heart failure: Secondary | ICD-10-CM | POA: Diagnosis not present

## 2017-09-10 DIAGNOSIS — M48061 Spinal stenosis, lumbar region without neurogenic claudication: Secondary | ICD-10-CM | POA: Diagnosis not present

## 2017-09-12 DIAGNOSIS — I110 Hypertensive heart disease with heart failure: Secondary | ICD-10-CM | POA: Diagnosis not present

## 2017-09-12 DIAGNOSIS — M48061 Spinal stenosis, lumbar region without neurogenic claudication: Secondary | ICD-10-CM | POA: Diagnosis not present

## 2017-09-12 DIAGNOSIS — T8131XA Disruption of external operation (surgical) wound, not elsewhere classified, initial encounter: Secondary | ICD-10-CM | POA: Diagnosis not present

## 2017-09-12 DIAGNOSIS — G9748 Accidental puncture and laceration of other nervous system organ or structure during a nervous system procedure: Secondary | ICD-10-CM | POA: Diagnosis not present

## 2017-09-12 DIAGNOSIS — I5040 Unspecified combined systolic (congestive) and diastolic (congestive) heart failure: Secondary | ICD-10-CM | POA: Diagnosis not present

## 2017-09-12 DIAGNOSIS — G062 Extradural and subdural abscess, unspecified: Secondary | ICD-10-CM | POA: Diagnosis not present

## 2017-09-19 DIAGNOSIS — T8131XA Disruption of external operation (surgical) wound, not elsewhere classified, initial encounter: Secondary | ICD-10-CM | POA: Diagnosis not present

## 2017-09-19 DIAGNOSIS — I5040 Unspecified combined systolic (congestive) and diastolic (congestive) heart failure: Secondary | ICD-10-CM | POA: Diagnosis not present

## 2017-09-19 DIAGNOSIS — G062 Extradural and subdural abscess, unspecified: Secondary | ICD-10-CM | POA: Diagnosis not present

## 2017-09-19 DIAGNOSIS — G9748 Accidental puncture and laceration of other nervous system organ or structure during a nervous system procedure: Secondary | ICD-10-CM | POA: Diagnosis not present

## 2017-09-19 DIAGNOSIS — I110 Hypertensive heart disease with heart failure: Secondary | ICD-10-CM | POA: Diagnosis not present

## 2017-09-19 DIAGNOSIS — M48061 Spinal stenosis, lumbar region without neurogenic claudication: Secondary | ICD-10-CM | POA: Diagnosis not present

## 2017-09-24 DIAGNOSIS — T8131XA Disruption of external operation (surgical) wound, not elsewhere classified, initial encounter: Secondary | ICD-10-CM | POA: Diagnosis not present

## 2017-09-24 DIAGNOSIS — I110 Hypertensive heart disease with heart failure: Secondary | ICD-10-CM | POA: Diagnosis not present

## 2017-09-24 DIAGNOSIS — I5040 Unspecified combined systolic (congestive) and diastolic (congestive) heart failure: Secondary | ICD-10-CM | POA: Diagnosis not present

## 2017-09-24 DIAGNOSIS — M48061 Spinal stenosis, lumbar region without neurogenic claudication: Secondary | ICD-10-CM | POA: Diagnosis not present

## 2017-09-24 DIAGNOSIS — G062 Extradural and subdural abscess, unspecified: Secondary | ICD-10-CM | POA: Diagnosis not present

## 2017-09-24 DIAGNOSIS — G9748 Accidental puncture and laceration of other nervous system organ or structure during a nervous system procedure: Secondary | ICD-10-CM | POA: Diagnosis not present

## 2017-09-26 DIAGNOSIS — G062 Extradural and subdural abscess, unspecified: Secondary | ICD-10-CM | POA: Diagnosis not present

## 2017-09-26 DIAGNOSIS — I110 Hypertensive heart disease with heart failure: Secondary | ICD-10-CM | POA: Diagnosis not present

## 2017-09-26 DIAGNOSIS — T8131XA Disruption of external operation (surgical) wound, not elsewhere classified, initial encounter: Secondary | ICD-10-CM | POA: Diagnosis not present

## 2017-09-26 DIAGNOSIS — I5040 Unspecified combined systolic (congestive) and diastolic (congestive) heart failure: Secondary | ICD-10-CM | POA: Diagnosis not present

## 2017-09-26 DIAGNOSIS — G9748 Accidental puncture and laceration of other nervous system organ or structure during a nervous system procedure: Secondary | ICD-10-CM | POA: Diagnosis not present

## 2017-09-26 DIAGNOSIS — M48061 Spinal stenosis, lumbar region without neurogenic claudication: Secondary | ICD-10-CM | POA: Diagnosis not present

## 2017-10-01 DIAGNOSIS — M48061 Spinal stenosis, lumbar region without neurogenic claudication: Secondary | ICD-10-CM | POA: Diagnosis not present

## 2017-10-01 DIAGNOSIS — I5040 Unspecified combined systolic (congestive) and diastolic (congestive) heart failure: Secondary | ICD-10-CM | POA: Diagnosis not present

## 2017-10-01 DIAGNOSIS — G062 Extradural and subdural abscess, unspecified: Secondary | ICD-10-CM | POA: Diagnosis not present

## 2017-10-01 DIAGNOSIS — I110 Hypertensive heart disease with heart failure: Secondary | ICD-10-CM | POA: Diagnosis not present

## 2017-10-01 DIAGNOSIS — T8131XA Disruption of external operation (surgical) wound, not elsewhere classified, initial encounter: Secondary | ICD-10-CM | POA: Diagnosis not present

## 2017-10-01 DIAGNOSIS — G9748 Accidental puncture and laceration of other nervous system organ or structure during a nervous system procedure: Secondary | ICD-10-CM | POA: Diagnosis not present

## 2017-10-11 DIAGNOSIS — M48061 Spinal stenosis, lumbar region without neurogenic claudication: Secondary | ICD-10-CM | POA: Diagnosis not present

## 2017-10-11 DIAGNOSIS — I110 Hypertensive heart disease with heart failure: Secondary | ICD-10-CM | POA: Diagnosis not present

## 2017-10-11 DIAGNOSIS — G062 Extradural and subdural abscess, unspecified: Secondary | ICD-10-CM | POA: Diagnosis not present

## 2017-10-11 DIAGNOSIS — G9748 Accidental puncture and laceration of other nervous system organ or structure during a nervous system procedure: Secondary | ICD-10-CM | POA: Diagnosis not present

## 2017-10-11 DIAGNOSIS — T8131XA Disruption of external operation (surgical) wound, not elsewhere classified, initial encounter: Secondary | ICD-10-CM | POA: Diagnosis not present

## 2017-10-11 DIAGNOSIS — I5040 Unspecified combined systolic (congestive) and diastolic (congestive) heart failure: Secondary | ICD-10-CM | POA: Diagnosis not present

## 2017-10-14 ENCOUNTER — Telehealth: Payer: Self-pay | Admitting: *Deleted

## 2017-10-14 ENCOUNTER — Other Ambulatory Visit: Payer: Self-pay | Admitting: Infectious Disease

## 2017-10-14 DIAGNOSIS — G062 Extradural and subdural abscess, unspecified: Secondary | ICD-10-CM

## 2017-10-14 NOTE — Telephone Encounter (Signed)
Triage received refill request for diflucan. Per office note, patient was supposed to let Dr Tommy Medal know if it worked on her rash so he could rechallenge with antibiotics.  RN called patient. She states 7 days of diflucan worked on her rash. She also wants to cancel her follow up on 3/13, stating she is going back home to [place] for a while, departs on 3/6 (already has a plane ticket). RN offered her an appointment 2/28, stating Dr Tommy Medal felt it was important to follow up on her infection. Patient accepted, will arrange transportation today. Landis Gandy, RN

## 2017-10-14 NOTE — Telephone Encounter (Signed)
Fine to give her another script. This also helps [name] understand she is actually NOT allergic to all of the antibiotics she thought she was.

## 2017-10-16 ENCOUNTER — Ambulatory Visit (INDEPENDENT_AMBULATORY_CARE_PROVIDER_SITE_OTHER): Payer: Medicare Other | Admitting: Infectious Disease

## 2017-10-16 ENCOUNTER — Encounter: Payer: Self-pay | Admitting: Infectious Disease

## 2017-10-16 VITALS — BP 143/82 | HR 83 | Temp 98.4°F | Ht 63.0 in | Wt 263.0 lb

## 2017-10-16 DIAGNOSIS — A498 Other bacterial infections of unspecified site: Secondary | ICD-10-CM

## 2017-10-16 DIAGNOSIS — L304 Erythema intertrigo: Secondary | ICD-10-CM | POA: Diagnosis not present

## 2017-10-16 DIAGNOSIS — A499 Bacterial infection, unspecified: Secondary | ICD-10-CM | POA: Diagnosis not present

## 2017-10-16 DIAGNOSIS — M4646 Discitis, unspecified, lumbar region: Secondary | ICD-10-CM | POA: Diagnosis not present

## 2017-10-16 DIAGNOSIS — T8149XA Infection following a procedure, other surgical site, initial encounter: Secondary | ICD-10-CM | POA: Diagnosis not present

## 2017-10-16 DIAGNOSIS — G061 Intraspinal abscess and granuloma: Secondary | ICD-10-CM | POA: Diagnosis present
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
## 2017-10-20 ENCOUNTER — Other Ambulatory Visit: Payer: Self-pay | Admitting: Infectious Disease

## 2017-10-29 ENCOUNTER — Ambulatory Visit: Payer: Self-pay | Admitting: Infectious Disease

## 2017-12-19 DIAGNOSIS — R5383 Other fatigue: Secondary | ICD-10-CM | POA: Diagnosis not present

## 2017-12-19 DIAGNOSIS — I10 Essential (primary) hypertension: Secondary | ICD-10-CM | POA: Diagnosis not present

## 2017-12-19 DIAGNOSIS — E7849 Other hyperlipidemia: Secondary | ICD-10-CM | POA: Diagnosis not present

## 2017-12-19 DIAGNOSIS — Z6841 Body Mass Index (BMI) 40.0 and over, adult: Secondary | ICD-10-CM | POA: Diagnosis not present

## 2017-12-19 DIAGNOSIS — G062 Extradural and subdural abscess, unspecified: Secondary | ICD-10-CM | POA: Diagnosis not present

## 2017-12-19 DIAGNOSIS — F5104 Psychophysiologic insomnia: Secondary | ICD-10-CM | POA: Diagnosis not present

## 2017-12-19 DIAGNOSIS — R2681 Unsteadiness on feet: Secondary | ICD-10-CM | POA: Diagnosis not present

## 2017-12-19 DIAGNOSIS — Z1389 Encounter for screening for other disorder: Secondary | ICD-10-CM | POA: Diagnosis not present

## 2017-12-19 DIAGNOSIS — M545 Low back pain: Secondary | ICD-10-CM | POA: Diagnosis not present

## 2017-12-19 DIAGNOSIS — R7301 Impaired fasting glucose: Secondary | ICD-10-CM | POA: Diagnosis not present

## 2018-01-13 DIAGNOSIS — M5416 Radiculopathy, lumbar region: Secondary | ICD-10-CM | POA: Diagnosis not present

## 2018-01-22 ENCOUNTER — Other Ambulatory Visit: Payer: Self-pay | Admitting: Internal Medicine

## 2018-01-22 DIAGNOSIS — Z1231 Encounter for screening mammogram for malignant neoplasm of breast: Secondary | ICD-10-CM

## 2018-01-31 ENCOUNTER — Emergency Department (HOSPITAL_COMMUNITY): Payer: Medicare Other

## 2018-01-31 ENCOUNTER — Encounter (HOSPITAL_COMMUNITY): Payer: Self-pay | Admitting: Emergency Medicine

## 2018-01-31 ENCOUNTER — Other Ambulatory Visit: Payer: Self-pay

## 2018-01-31 ENCOUNTER — Emergency Department (HOSPITAL_COMMUNITY)
Admission: EM | Admit: 2018-01-31 | Discharge: 2018-01-31 | Disposition: A | Discharge status: Home / Self Care | Payer: Medicare Other | Attending: Emergency Medicine | Admitting: Emergency Medicine

## 2018-01-31 DIAGNOSIS — Z79899 Other long term (current) drug therapy: Secondary | ICD-10-CM | POA: Diagnosis not present

## 2018-01-31 DIAGNOSIS — R1031 Right lower quadrant pain: Secondary | ICD-10-CM | POA: Diagnosis not present

## 2018-01-31 DIAGNOSIS — K5730 Diverticulosis of large intestine without perforation or abscess without bleeding: Secondary | ICD-10-CM | POA: Diagnosis not present

## 2018-01-31 DIAGNOSIS — I110 Hypertensive heart disease with heart failure: Secondary | ICD-10-CM | POA: Insufficient documentation

## 2018-01-31 DIAGNOSIS — I5042 Chronic combined systolic (congestive) and diastolic (congestive) heart failure: Secondary | ICD-10-CM | POA: Diagnosis not present

## 2018-01-31 DIAGNOSIS — Z96653 Presence of artificial knee joint, bilateral: Secondary | ICD-10-CM | POA: Insufficient documentation

## 2018-01-31 DIAGNOSIS — R202 Paresthesia of skin: Secondary | ICD-10-CM | POA: Diagnosis not present

## 2018-01-31 DIAGNOSIS — R1030 Lower abdominal pain, unspecified: Secondary | ICD-10-CM | POA: Diagnosis not present

## 2018-01-31 LAB — LIPASE, BLOOD: Lipase: 60 U/L — ABNORMAL HIGH (ref 11–51)

## 2018-01-31 LAB — URINALYSIS, ROUTINE W REFLEX MICROSCOPIC
Bacteria, UA: NONE SEEN
Bilirubin Urine: NEGATIVE
GLUCOSE, UA: NEGATIVE mg/dL
Hgb urine dipstick: NEGATIVE
Ketones, ur: NEGATIVE mg/dL
Nitrite: NEGATIVE
PH: 6 (ref 5.0–8.0)
Protein, ur: NEGATIVE mg/dL
SPECIFIC GRAVITY, URINE: 1.015 (ref 1.005–1.030)

## 2018-01-31 LAB — COMPREHENSIVE METABOLIC PANEL
ALK PHOS: 58 U/L (ref 38–126)
ALT: 9 U/L — ABNORMAL LOW (ref 14–54)
AST: 20 U/L (ref 15–41)
Albumin: 3.8 g/dL (ref 3.5–5.0)
Anion gap: 10 (ref 5–15)
BUN: 13 mg/dL (ref 6–20)
CALCIUM: 9 mg/dL (ref 8.9–10.3)
CO2: 24 mmol/L (ref 22–32)
Chloride: 106 mmol/L (ref 101–111)
Creatinine, Ser: 1.13 mg/dL — ABNORMAL HIGH (ref 0.44–1.00)
GFR calc Af Amer: 57 mL/min — ABNORMAL LOW (ref >60)
GFR, EST NON AFRICAN AMERICAN: 49 mL/min — AB (ref >60)
GLUCOSE: 133 mg/dL — AB (ref 65–99)
Potassium: 4.3 mmol/L (ref 3.5–5.1)
Sodium: 140 mmol/L (ref 135–145)
TOTAL PROTEIN: 7.4 g/dL (ref 6.5–8.1)
Total Bilirubin: 0.4 mg/dL (ref 0.3–1.2)

## 2018-01-31 LAB — CBC
HCT: 45.1 % (ref 36.0–46.0)
Hemoglobin: 13.4 g/dL (ref 12.0–15.0)
MCH: 24.5 pg — ABNORMAL LOW (ref 26.0–34.0)
MCHC: 29.7 g/dL — AB (ref 30.0–36.0)
MCV: 82.4 fL (ref 78.0–100.0)
PLATELETS: 174 10*3/uL (ref 150–400)
RBC: 5.47 MIL/uL — ABNORMAL HIGH (ref 3.87–5.11)
RDW: 17.6 % — AB (ref 11.5–15.5)
WBC: 4.2 10*3/uL (ref 4.0–10.5)

## 2018-01-31 MED ORDER — POTASSIUM CHLORIDE CRYS ER 20 MEQ PO TBCR: 40.0000 meq | EXTENDED_RELEASE_TABLET | Freq: Once | ORAL | Status: DC

## 2018-01-31 MED ORDER — FENTANYL CITRATE (PF) 100 MCG/2ML IJ SOLN
50.0000 ug | Freq: Once | INTRAMUSCULAR | Status: AC
  Administered 2018-01-31: 50 ug via INTRAMUSCULAR

## 2018-01-31 MED ORDER — AZITHROMYCIN 250 MG PO TABS: 500.0000 mg | ORAL_TABLET | Freq: Once | ORAL | Status: DC

## 2018-01-31 MED ORDER — FENTANYL CITRATE (PF) 100 MCG/2ML IJ SOLN
50.0000 ug | Freq: Once | INTRAMUSCULAR | Status: DC
  Filled 2018-01-31: qty 2
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
## 2018-01-31 NOTE — ED Triage Notes (Signed)
Pt to ED c/o R and mid abdominal pain x 2 months - reports she has chronic back pain, but the pain is now radiating from her back to her abdomen, states "it feels like a tingling pain and something is pressing on something in there and it hurts more when I move and walk." Endorses nausea x 2 months off and on, had diarrhea yesterday but none today. Denies fevers/chills. Denies incontinence, no numbness/tingling in extremities. Ambulatory with steady gait.

## 2018-01-31 NOTE — Discharge Instructions (Signed)
Follow-up with your primary care doctor in the next 2 to 4 days for further evaluation.  As we discussed, you can also follow-up with referred GI doctor.  Return to the Emergency Department immediately if you experience any worsening abdominal pain, fever, persistent nausea and vomiting, inability keep any food down, pain with urination, blood in your urine or any other worsening or concerning symptoms.

## 2018-02-03 DIAGNOSIS — H35352 Cystoid macular degeneration, left eye: Secondary | ICD-10-CM | POA: Diagnosis not present

## 2018-02-12 DIAGNOSIS — M4804 Spinal stenosis, thoracic region: Secondary | ICD-10-CM | POA: Diagnosis not present

## 2018-02-12 DIAGNOSIS — M25551 Pain in right hip: Secondary | ICD-10-CM | POA: Diagnosis not present

## 2018-02-12 DIAGNOSIS — M5416 Radiculopathy, lumbar region: Secondary | ICD-10-CM | POA: Diagnosis not present

## 2018-02-12 DIAGNOSIS — M7061 Trochanteric bursitis, right hip: Secondary | ICD-10-CM | POA: Diagnosis not present

## 2018-02-12 DIAGNOSIS — M961 Postlaminectomy syndrome, not elsewhere classified: Secondary | ICD-10-CM | POA: Diagnosis not present

## 2018-02-12 DIAGNOSIS — M4316 Spondylolisthesis, lumbar region: Secondary | ICD-10-CM | POA: Diagnosis not present

## 2018-02-12 DIAGNOSIS — M47816 Spondylosis without myelopathy or radiculopathy, lumbar region: Secondary | ICD-10-CM | POA: Diagnosis not present

## 2018-02-12 DIAGNOSIS — M7918 Myalgia, other site: Secondary | ICD-10-CM | POA: Diagnosis not present

## 2018-02-13 ENCOUNTER — Ambulatory Visit: Payer: Self-pay

## 2018-02-25 DIAGNOSIS — H209 Unspecified iridocyclitis: Secondary | ICD-10-CM | POA: Diagnosis not present

## 2018-03-03 DIAGNOSIS — Z6841 Body Mass Index (BMI) 40.0 and over, adult: Secondary | ICD-10-CM | POA: Diagnosis not present

## 2018-03-03 DIAGNOSIS — I10 Essential (primary) hypertension: Secondary | ICD-10-CM | POA: Diagnosis not present

## 2018-03-03 DIAGNOSIS — M5416 Radiculopathy, lumbar region: Secondary | ICD-10-CM | POA: Diagnosis not present

## 2018-03-03 DIAGNOSIS — M961 Postlaminectomy syndrome, not elsewhere classified: Secondary | ICD-10-CM | POA: Diagnosis not present

## 2018-03-03 DIAGNOSIS — M7061 Trochanteric bursitis, right hip: Secondary | ICD-10-CM | POA: Diagnosis not present

## 2018-03-03 DIAGNOSIS — M7918 Myalgia, other site: Secondary | ICD-10-CM | POA: Diagnosis not present

## 2018-03-10 ENCOUNTER — Ambulatory Visit
Admission: RE | Admit: 2018-03-10 | Discharge: 2018-03-10 | Disposition: A | Discharge status: Home / Self Care | Payer: Medicare Other | Source: Ambulatory Visit | Attending: Internal Medicine | Admitting: Internal Medicine

## 2018-03-10 DIAGNOSIS — Z1231 Encounter for screening mammogram for malignant neoplasm of breast: Secondary | ICD-10-CM

## 2018-04-01 DIAGNOSIS — H35352 Cystoid macular degeneration, left eye: Secondary | ICD-10-CM | POA: Diagnosis not present

## 2018-04-22 DIAGNOSIS — M5416 Radiculopathy, lumbar region: Secondary | ICD-10-CM | POA: Diagnosis not present

## 2018-05-19 DIAGNOSIS — Z23 Encounter for immunization: Secondary | ICD-10-CM | POA: Diagnosis not present

## 2018-06-15 DIAGNOSIS — R2681 Unsteadiness on feet: Secondary | ICD-10-CM | POA: Diagnosis not present

## 2018-06-15 DIAGNOSIS — Z6841 Body Mass Index (BMI) 40.0 and over, adult: Secondary | ICD-10-CM | POA: Diagnosis not present

## 2018-06-15 DIAGNOSIS — I10 Essential (primary) hypertension: Secondary | ICD-10-CM | POA: Diagnosis not present

## 2018-06-15 DIAGNOSIS — M545 Low back pain: Secondary | ICD-10-CM | POA: Diagnosis not present

## 2018-06-15 DIAGNOSIS — F418 Other specified anxiety disorders: Secondary | ICD-10-CM | POA: Diagnosis not present

## 2018-06-15 DIAGNOSIS — R7301 Impaired fasting glucose: Secondary | ICD-10-CM | POA: Diagnosis not present

## 2018-06-16 ENCOUNTER — Inpatient Hospital Stay (HOSPITAL_COMMUNITY)
Admission: EM | Admit: 2018-06-16 | Discharge: 2018-06-23 | DRG: 913 | Disposition: A | Discharge status: Home Health | Payer: Medicare Other | Attending: Internal Medicine | Admitting: Internal Medicine

## 2018-06-16 ENCOUNTER — Inpatient Hospital Stay (HOSPITAL_COMMUNITY): Payer: Medicare Other

## 2018-06-16 ENCOUNTER — Emergency Department (HOSPITAL_COMMUNITY): Payer: Medicare Other

## 2018-06-16 ENCOUNTER — Other Ambulatory Visit: Payer: Self-pay

## 2018-06-16 ENCOUNTER — Encounter (HOSPITAL_COMMUNITY): Payer: Self-pay | Admitting: Emergency Medicine

## 2018-06-16 DIAGNOSIS — N179 Acute kidney failure, unspecified: Secondary | ICD-10-CM | POA: Diagnosis present

## 2018-06-16 DIAGNOSIS — I509 Heart failure, unspecified: Secondary | ICD-10-CM

## 2018-06-16 DIAGNOSIS — D638 Anemia in other chronic diseases classified elsewhere: Secondary | ICD-10-CM | POA: Diagnosis present

## 2018-06-16 DIAGNOSIS — Z8249 Family history of ischemic heart disease and other diseases of the circulatory system: Secondary | ICD-10-CM

## 2018-06-16 DIAGNOSIS — Z881 Allergy status to other antibiotic agents status: Secondary | ICD-10-CM

## 2018-06-16 DIAGNOSIS — R000 Tachycardia, unspecified: Secondary | ICD-10-CM | POA: Diagnosis not present

## 2018-06-16 DIAGNOSIS — R627 Adult failure to thrive: Secondary | ICD-10-CM | POA: Diagnosis present

## 2018-06-16 DIAGNOSIS — I5042 Chronic combined systolic (congestive) and diastolic (congestive) heart failure: Secondary | ICD-10-CM | POA: Diagnosis present

## 2018-06-16 DIAGNOSIS — M549 Dorsalgia, unspecified: Secondary | ICD-10-CM | POA: Diagnosis not present

## 2018-06-16 DIAGNOSIS — S79912A Unspecified injury of left hip, initial encounter: Secondary | ICD-10-CM | POA: Diagnosis not present

## 2018-06-16 DIAGNOSIS — R509 Fever, unspecified: Secondary | ICD-10-CM | POA: Diagnosis present

## 2018-06-16 DIAGNOSIS — D696 Thrombocytopenia, unspecified: Secondary | ICD-10-CM | POA: Diagnosis present

## 2018-06-16 DIAGNOSIS — S42309A Unspecified fracture of shaft of humerus, unspecified arm, initial encounter for closed fracture: Secondary | ICD-10-CM | POA: Diagnosis present

## 2018-06-16 DIAGNOSIS — Y92009 Unspecified place in unspecified non-institutional (private) residence as the place of occurrence of the external cause: Secondary | ICD-10-CM

## 2018-06-16 DIAGNOSIS — N281 Cyst of kidney, acquired: Secondary | ICD-10-CM | POA: Diagnosis not present

## 2018-06-16 DIAGNOSIS — D62 Acute posthemorrhagic anemia: Secondary | ICD-10-CM | POA: Diagnosis present

## 2018-06-16 DIAGNOSIS — S36899A Unspecified injury of other intra-abdominal organs, initial encounter: Secondary | ICD-10-CM | POA: Diagnosis present

## 2018-06-16 DIAGNOSIS — Z6837 Body mass index (BMI) 37.0-37.9, adult: Secondary | ICD-10-CM

## 2018-06-16 DIAGNOSIS — M5489 Other dorsalgia: Secondary | ICD-10-CM | POA: Diagnosis not present

## 2018-06-16 DIAGNOSIS — G9341 Metabolic encephalopathy: Secondary | ICD-10-CM | POA: Diagnosis not present

## 2018-06-16 DIAGNOSIS — W010XXA Fall on same level from slipping, tripping and stumbling without subsequent striking against object, initial encounter: Secondary | ICD-10-CM | POA: Diagnosis present

## 2018-06-16 DIAGNOSIS — R579 Shock, unspecified: Secondary | ICD-10-CM | POA: Diagnosis present

## 2018-06-16 DIAGNOSIS — R52 Pain, unspecified: Secondary | ICD-10-CM | POA: Diagnosis not present

## 2018-06-16 DIAGNOSIS — M1612 Unilateral primary osteoarthritis, left hip: Secondary | ICD-10-CM | POA: Diagnosis present

## 2018-06-16 DIAGNOSIS — Z91048 Other nonmedicinal substance allergy status: Secondary | ICD-10-CM

## 2018-06-16 DIAGNOSIS — Z8701 Personal history of pneumonia (recurrent): Secondary | ICD-10-CM

## 2018-06-16 DIAGNOSIS — J42 Unspecified chronic bronchitis: Secondary | ICD-10-CM | POA: Diagnosis not present

## 2018-06-16 DIAGNOSIS — S22078A Other fracture of T9-T10 vertebra, initial encounter for closed fracture: Secondary | ICD-10-CM | POA: Diagnosis present

## 2018-06-16 DIAGNOSIS — Z91013 Allergy to seafood: Secondary | ICD-10-CM

## 2018-06-16 DIAGNOSIS — F329 Major depressive disorder, single episode, unspecified: Secondary | ICD-10-CM | POA: Diagnosis not present

## 2018-06-16 DIAGNOSIS — M6282 Rhabdomyolysis: Secondary | ICD-10-CM | POA: Diagnosis not present

## 2018-06-16 DIAGNOSIS — I130 Hypertensive heart and chronic kidney disease with heart failure and stage 1 through stage 4 chronic kidney disease, or unspecified chronic kidney disease: Secondary | ICD-10-CM | POA: Diagnosis not present

## 2018-06-16 DIAGNOSIS — M48061 Spinal stenosis, lumbar region without neurogenic claudication: Secondary | ICD-10-CM | POA: Diagnosis present

## 2018-06-16 DIAGNOSIS — M2578 Osteophyte, vertebrae: Secondary | ICD-10-CM | POA: Diagnosis not present

## 2018-06-16 DIAGNOSIS — R197 Diarrhea, unspecified: Secondary | ICD-10-CM | POA: Diagnosis present

## 2018-06-16 DIAGNOSIS — M25519 Pain in unspecified shoulder: Secondary | ICD-10-CM | POA: Diagnosis not present

## 2018-06-16 DIAGNOSIS — E785 Hyperlipidemia, unspecified: Secondary | ICD-10-CM

## 2018-06-16 DIAGNOSIS — S299XXA Unspecified injury of thorax, initial encounter: Secondary | ICD-10-CM | POA: Diagnosis not present

## 2018-06-16 DIAGNOSIS — Z96611 Presence of right artificial shoulder joint: Secondary | ICD-10-CM | POA: Diagnosis present

## 2018-06-16 DIAGNOSIS — M4300 Spondylolysis, site unspecified: Secondary | ICD-10-CM | POA: Diagnosis present

## 2018-06-16 DIAGNOSIS — S42252A Displaced fracture of greater tuberosity of left humerus, initial encounter for closed fracture: Secondary | ICD-10-CM | POA: Diagnosis present

## 2018-06-16 DIAGNOSIS — Z91041 Radiographic dye allergy status: Secondary | ICD-10-CM

## 2018-06-16 DIAGNOSIS — Z885 Allergy status to narcotic agent status: Secondary | ICD-10-CM

## 2018-06-16 DIAGNOSIS — Z882 Allergy status to sulfonamides status: Secondary | ICD-10-CM

## 2018-06-16 DIAGNOSIS — Z803 Family history of malignant neoplasm of breast: Secondary | ICD-10-CM

## 2018-06-16 DIAGNOSIS — A419 Sepsis, unspecified organism: Secondary | ICD-10-CM | POA: Diagnosis present

## 2018-06-16 DIAGNOSIS — Z79899 Other long term (current) drug therapy: Secondary | ICD-10-CM

## 2018-06-16 DIAGNOSIS — Z96653 Presence of artificial knee joint, bilateral: Secondary | ICD-10-CM | POA: Diagnosis present

## 2018-06-16 DIAGNOSIS — Z9089 Acquired absence of other organs: Secondary | ICD-10-CM

## 2018-06-16 DIAGNOSIS — F32A Depression, unspecified: Secondary | ICD-10-CM | POA: Diagnosis present

## 2018-06-16 DIAGNOSIS — I10 Essential (primary) hypertension: Secondary | ICD-10-CM | POA: Diagnosis not present

## 2018-06-16 DIAGNOSIS — Z9842 Cataract extraction status, left eye: Secondary | ICD-10-CM

## 2018-06-16 DIAGNOSIS — K589 Irritable bowel syndrome without diarrhea: Secondary | ICD-10-CM | POA: Diagnosis present

## 2018-06-16 DIAGNOSIS — R5381 Other malaise: Secondary | ICD-10-CM | POA: Diagnosis present

## 2018-06-16 DIAGNOSIS — S42292A Other displaced fracture of upper end of left humerus, initial encounter for closed fracture: Secondary | ICD-10-CM | POA: Diagnosis not present

## 2018-06-16 DIAGNOSIS — S42212A Unspecified displaced fracture of surgical neck of left humerus, initial encounter for closed fracture: Secondary | ICD-10-CM | POA: Diagnosis not present

## 2018-06-16 DIAGNOSIS — Z90710 Acquired absence of both cervix and uterus: Secondary | ICD-10-CM

## 2018-06-16 DIAGNOSIS — Z9102 Food additives allergy status: Secondary | ICD-10-CM

## 2018-06-16 DIAGNOSIS — Z886 Allergy status to analgesic agent status: Secondary | ICD-10-CM

## 2018-06-16 DIAGNOSIS — E7800 Pure hypercholesterolemia, unspecified: Secondary | ICD-10-CM | POA: Diagnosis not present

## 2018-06-16 DIAGNOSIS — R7989 Other specified abnormal findings of blood chemistry: Secondary | ICD-10-CM

## 2018-06-16 DIAGNOSIS — Z91012 Allergy to eggs: Secondary | ICD-10-CM

## 2018-06-16 DIAGNOSIS — M25552 Pain in left hip: Secondary | ICD-10-CM | POA: Diagnosis not present

## 2018-06-16 DIAGNOSIS — N183 Chronic kidney disease, stage 3 unspecified: Secondary | ICD-10-CM | POA: Diagnosis present

## 2018-06-16 DIAGNOSIS — K219 Gastro-esophageal reflux disease without esophagitis: Secondary | ICD-10-CM | POA: Diagnosis present

## 2018-06-16 DIAGNOSIS — F419 Anxiety disorder, unspecified: Secondary | ICD-10-CM | POA: Diagnosis present

## 2018-06-16 DIAGNOSIS — K5900 Constipation, unspecified: Secondary | ICD-10-CM | POA: Diagnosis present

## 2018-06-16 DIAGNOSIS — R112 Nausea with vomiting, unspecified: Secondary | ICD-10-CM | POA: Diagnosis present

## 2018-06-16 DIAGNOSIS — S3991XA Unspecified injury of abdomen, initial encounter: Secondary | ICD-10-CM | POA: Diagnosis not present

## 2018-06-16 DIAGNOSIS — Z91018 Allergy to other foods: Secondary | ICD-10-CM

## 2018-06-16 DIAGNOSIS — S3992XA Unspecified injury of lower back, initial encounter: Secondary | ICD-10-CM | POA: Diagnosis not present

## 2018-06-16 DIAGNOSIS — M545 Low back pain: Secondary | ICD-10-CM | POA: Diagnosis not present

## 2018-06-16 DIAGNOSIS — Z981 Arthrodesis status: Secondary | ICD-10-CM

## 2018-06-16 DIAGNOSIS — R339 Retention of urine, unspecified: Secondary | ICD-10-CM | POA: Diagnosis present

## 2018-06-16 DIAGNOSIS — K661 Hemoperitoneum: Secondary | ICD-10-CM | POA: Diagnosis not present

## 2018-06-16 DIAGNOSIS — Z9049 Acquired absence of other specified parts of digestive tract: Secondary | ICD-10-CM

## 2018-06-16 LAB — I-STAT CHEM 8, ED
BUN: 5 mg/dL — ABNORMAL LOW (ref 8–23)
CREATININE: 0.9 mg/dL (ref 0.44–1.00)
Calcium, Ion: 1.08 mmol/L — ABNORMAL LOW (ref 1.15–1.40)
Chloride: 106 mmol/L (ref 98–111)
GLUCOSE: 174 mg/dL — AB (ref 70–99)
HCT: 41 % (ref 36.0–46.0)
HEMOGLOBIN: 13.9 g/dL (ref 12.0–15.0)
POTASSIUM: 3.6 mmol/L (ref 3.5–5.1)
Sodium: 141 mmol/L (ref 135–145)
TCO2: 20 mmol/L — ABNORMAL LOW (ref 22–32)

## 2018-06-16 LAB — CBC WITH DIFFERENTIAL/PLATELET
Abs Immature Granulocytes: 0.4 10*3/uL — ABNORMAL HIGH (ref 0.00–0.07)
Basophils Absolute: 0 10*3/uL (ref 0.0–0.1)
Basophils Relative: 0 %
EOS ABS: 0 10*3/uL (ref 0.0–0.5)
EOS PCT: 0 %
HEMATOCRIT: 40.9 % (ref 36.0–46.0)
Hemoglobin: 12.7 g/dL (ref 12.0–15.0)
IMMATURE GRANULOCYTES: 3 %
LYMPHS ABS: 0.8 10*3/uL (ref 0.7–4.0)
Lymphocytes Relative: 6 %
MCH: 25.7 pg — ABNORMAL LOW (ref 26.0–34.0)
MCHC: 31.1 g/dL (ref 30.0–36.0)
MCV: 82.6 fL (ref 80.0–100.0)
MONOS PCT: 20 %
Monocytes Absolute: 2.6 10*3/uL — ABNORMAL HIGH (ref 0.1–1.0)
Neutro Abs: 9.1 10*3/uL — ABNORMAL HIGH (ref 1.7–7.7)
Neutrophils Relative %: 71 %
Platelets: 147 10*3/uL — ABNORMAL LOW (ref 150–400)
RBC: 4.95 MIL/uL (ref 3.87–5.11)
RDW: 19 % — AB (ref 11.5–15.5)
WBC: 12.9 10*3/uL — ABNORMAL HIGH (ref 4.0–10.5)
nRBC: 0 % (ref 0.0–0.2)

## 2018-06-16 LAB — COMPREHENSIVE METABOLIC PANEL
ALBUMIN: 3.9 g/dL (ref 3.5–5.0)
ALK PHOS: 49 U/L (ref 38–126)
ALT: 16 U/L (ref 0–44)
ANION GAP: 13 (ref 5–15)
AST: 41 U/L (ref 15–41)
BUN: 8 mg/dL (ref 8–23)
CALCIUM: 8.8 mg/dL — AB (ref 8.9–10.3)
CO2: 20 mmol/L — AB (ref 22–32)
Chloride: 109 mmol/L (ref 98–111)
Creatinine, Ser: 1.12 mg/dL — ABNORMAL HIGH (ref 0.44–1.00)
GFR calc Af Amer: 57 mL/min — ABNORMAL LOW (ref >60)
GFR calc non Af Amer: 49 mL/min — ABNORMAL LOW (ref >60)
GLUCOSE: 169 mg/dL — AB (ref 70–99)
Potassium: 3.9 mmol/L (ref 3.5–5.1)
SODIUM: 142 mmol/L (ref 135–145)
Total Bilirubin: 0.6 mg/dL (ref 0.3–1.2)
Total Protein: 7.4 g/dL (ref 6.5–8.1)

## 2018-06-16 LAB — I-STAT CG4 LACTIC ACID, ED
Lactic Acid, Venous: 6.14 mmol/L (ref 0.5–1.9)
Lactic Acid, Venous: 4.02 mmol/L (ref 0.5–1.9)
Lactic Acid, Venous: 1.08 mmol/L (ref 0.5–1.9)

## 2018-06-16 LAB — TROPONIN I: Troponin I: 0.03 ng/mL (ref <0.03)

## 2018-06-16 LAB — SEDIMENTATION RATE: Sed Rate: 15 mm/hr (ref 0–22)

## 2018-06-16 MED ORDER — CLONIDINE HCL 0.1 MG PO TABS
0.3000 mg | ORAL_TABLET | Freq: Two times a day (BID) | ORAL | Status: DC
  Administered 2018-06-17 (×2): 0.3 mg via ORAL
  Filled 2018-06-16 (×2): qty 3

## 2018-06-16 MED ORDER — CITALOPRAM HYDROBROMIDE 40 MG PO TABS
40.0000 mg | ORAL_TABLET | Freq: Every day | ORAL | Status: DC
  Administered 2018-06-17 – 2018-06-22 (×7): 40 mg via ORAL
  Filled 2018-06-16: qty 1
  Filled 2018-06-16: qty 2
  Filled 2018-06-16 (×3): qty 1
  Filled 2018-06-16: qty 4
  Filled 2018-06-16: qty 2
  Filled 2018-06-16: qty 1
  Filled 2018-06-16: qty 2
  Filled 2018-06-16: qty 1
  Filled 2018-06-16: qty 2
  Filled 2018-06-16: qty 1
  Filled 2018-06-16: qty 2
  Filled 2018-06-16: qty 1

## 2018-06-16 MED ORDER — MORPHINE SULFATE (PF) 4 MG/ML IV SOLN
6.0000 mg | Freq: Once | INTRAVENOUS | Status: DC
  Filled 2018-06-16: qty 2

## 2018-06-16 MED ORDER — LOPERAMIDE HCL 2 MG PO CAPS: 2.0000 mg | ORAL_CAPSULE | Freq: Four times a day (QID) | ORAL | Status: DC | PRN

## 2018-06-16 MED ORDER — VANCOMYCIN HCL IN DEXTROSE 1-5 GM/200ML-% IV SOLN: 1000.0000 mg | INTRAVENOUS | Status: DC

## 2018-06-16 MED ORDER — MORPHINE SULFATE (PF) 4 MG/ML IV SOLN
6.0000 mg | Freq: Once | INTRAVENOUS | Status: AC
  Administered 2018-06-16: 6 mg via INTRAVENOUS

## 2018-06-16 MED ORDER — ONDANSETRON HCL 4 MG/2ML IJ SOLN
4.0000 mg | Freq: Once | INTRAMUSCULAR | Status: AC
  Administered 2018-06-16: 4 mg via INTRAVENOUS
  Filled 2018-06-16: qty 2

## 2018-06-16 MED ORDER — VANCOMYCIN HCL IN DEXTROSE 750-5 MG/150ML-% IV SOLN
750.0000 mg | INTRAVENOUS | Status: DC
  Administered 2018-06-17: 750 mg via INTRAVENOUS
  Filled 2018-06-16: qty 150

## 2018-06-16 MED ORDER — SODIUM CHLORIDE 0.9 % IV SOLN
2.0000 g | Freq: Once | INTRAVENOUS | Status: AC
  Administered 2018-06-16: 2 g via INTRAVENOUS
  Filled 2018-06-16: qty 2

## 2018-06-16 MED ORDER — HYDROMORPHONE HCL 1 MG/ML IJ SOLN
1.0000 mg | Freq: Once | INTRAMUSCULAR | Status: AC
  Administered 2018-06-16: 1 mg via INTRAVENOUS
  Filled 2018-06-16: qty 1

## 2018-06-16 MED ORDER — SODIUM CHLORIDE 0.9 % IV BOLUS (SEPSIS)
1000.0000 mL | Freq: Once | INTRAVENOUS | Status: AC
  Administered 2018-06-16: 1000 mL via INTRAVENOUS

## 2018-06-16 MED ORDER — DIPHENHYDRAMINE HCL 50 MG/ML IJ SOLN: 50.0000 mg | Freq: Once | INTRAMUSCULAR | Status: DC

## 2018-06-16 MED ORDER — VANCOMYCIN HCL 10 G IV SOLR
1500.0000 mg | Freq: Once | INTRAVENOUS | Status: AC
  Administered 2018-06-16: 1500 mg via INTRAVENOUS
  Filled 2018-06-16: qty 1500

## 2018-06-16 MED ORDER — ALBUTEROL SULFATE (2.5 MG/3ML) 0.083% IN NEBU: 2.5000 mg | INHALATION_SOLUTION | Freq: Four times a day (QID) | RESPIRATORY_TRACT | Status: DC | PRN

## 2018-06-16 MED ORDER — GABAPENTIN 300 MG PO CAPS
300.0000 mg | ORAL_CAPSULE | Freq: Every day | ORAL | Status: DC
  Administered 2018-06-17 – 2018-06-23 (×7): 300 mg via ORAL
  Filled 2018-06-16 (×7): qty 1

## 2018-06-16 MED ORDER — DIPHENHYDRAMINE HCL 25 MG PO TABS: 25.0000 mg | ORAL_TABLET | Freq: Four times a day (QID) | ORAL | Status: DC | PRN

## 2018-06-16 MED ORDER — PANTOPRAZOLE SODIUM 40 MG PO TBEC
40.0000 mg | DELAYED_RELEASE_TABLET | Freq: Every day | ORAL | Status: DC
  Administered 2018-06-17 – 2018-06-23 (×8): 40 mg via ORAL
  Filled 2018-06-16 (×8): qty 1

## 2018-06-16 MED ORDER — ONDANSETRON HCL 4 MG/2ML IJ SOLN
4.0000 mg | Freq: Four times a day (QID) | INTRAMUSCULAR | Status: DC | PRN
  Administered 2018-06-16: 4 mg via INTRAVENOUS
  Filled 2018-06-16: qty 2

## 2018-06-16 MED ORDER — MORPHINE SULFATE (PF) 2 MG/ML IV SOLN
1.0000 mg | INTRAVENOUS | Status: DC | PRN
  Administered 2018-06-16 – 2018-06-17 (×3): 1 mg via INTRAVENOUS
  Filled 2018-06-16 (×3): qty 1

## 2018-06-16 MED ORDER — METRONIDAZOLE IN NACL 5-0.79 MG/ML-% IV SOLN
500.0000 mg | Freq: Three times a day (TID) | INTRAVENOUS | Status: DC
  Filled 2018-06-16: qty 100

## 2018-06-16 MED ORDER — METOPROLOL TARTRATE 25 MG PO TABS
25.0000 mg | ORAL_TABLET | Freq: Every day | ORAL | Status: DC
  Administered 2018-06-17 – 2018-06-19 (×4): 25 mg via ORAL
  Filled 2018-06-16 (×4): qty 1

## 2018-06-16 MED ORDER — SIMVASTATIN 10 MG PO TABS
20.0000 mg | ORAL_TABLET | Freq: Every evening | ORAL | Status: DC
  Administered 2018-06-17 – 2018-06-23 (×8): 20 mg via ORAL
  Filled 2018-06-16 (×2): qty 2
  Filled 2018-06-16: qty 1
  Filled 2018-06-16 (×4): qty 2
  Filled 2018-06-16: qty 1
  Filled 2018-06-16: qty 2

## 2018-06-16 MED ORDER — OXYCODONE-ACETAMINOPHEN 5-325 MG PO TABS: 1.0000 | ORAL_TABLET | Freq: Four times a day (QID) | ORAL | 0 refills | Status: DC | PRN

## 2018-06-16 MED ORDER — ENOXAPARIN SODIUM 40 MG/0.4ML ~~LOC~~ SOLN
40.0000 mg | SUBCUTANEOUS | Status: DC
  Administered 2018-06-16: 40 mg via SUBCUTANEOUS
  Filled 2018-06-16: qty 0.4

## 2018-06-16 MED ORDER — SODIUM CHLORIDE 0.9 % IV SOLN
2.0000 g | Freq: Two times a day (BID) | INTRAVENOUS | Status: DC
  Administered 2018-06-17 (×2): 2 g via INTRAVENOUS
  Filled 2018-06-16 (×3): qty 2

## 2018-06-16 MED ORDER — MORPHINE SULFATE (PF) 4 MG/ML IV SOLN
4.0000 mg | Freq: Once | INTRAVENOUS | Status: DC
  Filled 2018-06-16: qty 1

## 2018-06-16 MED ORDER — OXYCODONE-ACETAMINOPHEN 5-325 MG PO TABS
1.0000 | ORAL_TABLET | Freq: Four times a day (QID) | ORAL | Status: DC | PRN
  Administered 2018-06-17: 1 via ORAL
  Administered 2018-06-18 – 2018-06-23 (×15): 2 via ORAL
  Filled 2018-06-16: qty 1
  Filled 2018-06-16 (×16): qty 2

## 2018-06-16 MED ORDER — MORPHINE SULFATE (PF) 4 MG/ML IV SOLN
6.0000 mg | Freq: Once | INTRAVENOUS | Status: AC
  Administered 2018-06-16: 6 mg via INTRAMUSCULAR
  Filled 2018-06-16: qty 2

## 2018-06-16 NOTE — ED Notes (Signed)
Pt stating she cannot tolerating taking her PO medication at this time.

## 2018-06-16 NOTE — Consult Note (Addendum)
ER Called about patient.  Has minimally displaced L proximal humerus fracture.  NWB in sling x6 weeks, followup outpatient when discharged.  No PT or ROM of shoulder.  Sling at all times. OK to move hand and wrist while in sling.

## 2018-06-16 NOTE — Progress Notes (Signed)
Orthopedic Tech Progress Note Patient Details:  [name] 12/10/1950 [id number] Pt already have a sling on that was ordered by Gertie Fey. Patient ID: [name], female   DOB: [birth year], 67 y.o.   MRN: [id number]   Ladell Pier [facility] 06/16/2018, 10:48 PM

## 2018-06-16 NOTE — ED Notes (Signed)
Patient alert and oriented x 4. Patient gave verbal consent to notifiy daughter on the patient's location in the ED.

## 2018-06-16 NOTE — ED Notes (Signed)
Bed: WA14 Expected date:  Expected time:  Means of arrival:  Comments: Hall D 

## 2018-06-16 NOTE — H&P (Addendum)
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 Password [email or web address] [facility]  06/16/2018, 11:33 PM

## 2018-06-16 NOTE — ED Triage Notes (Addendum)
Patient arrived by EMS from home. Patient fell at home. Patient fell face down, when EMS arrived patient was face down on the floor. Pt c/o of left shoulder pain and back pain. Pt has had previous back surgery. Pt denies hitting head or loss of consciousness. BP palpated 180.

## 2018-06-16 NOTE — ED Notes (Signed)
Patient transported to X-ray 
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
## 2018-06-16 NOTE — Progress Notes (Signed)
Pharmacy Antibiotic Note  [name] is a 67 y.o. female admitted on 06/16/2018 with possible recurrence of epidural abscess/diskitis.  Pharmacy has been consulted for vancomycin and cefepime dosing. See lengthy ID note from 10/16/17 OV for full history. Note patient reported allergy of neutropenia from vancomycin after prolonged course of 6 wks. Per ID, this is more likely a dose-related adverse event and not a true allergy. Recommended starting vancomycin for now with narrowing as cultures/clinical course permits.  Plan:  Vancomycin 1500 mg IV now, then 750 mg IV q24 hr (est AUC 450 based on SCr 1.12)  Measure vancomycin AUC at steady state as indicated  Cefepime 2 g IV q12 hr  SCr q48 while on vanc  ID to see in AM  Height: 5\' 3"  (160 cm) Weight: 210 lb (95.3 kg) IBW/kg (Calculated) : 52.4  Temp (24hrs), Avg:98 F (36.7 C), Min:98 F (36.7 C), Max:98 F (36.7 C)  Recent Labs  Lab 06/16/18 1848 06/16/18 1849 06/16/18 1915  WBC  --   --  12.9*  CREATININE 0.90  --   --   LATICACIDVEN  --  6.14*  --     Estimated Creatinine Clearance: 64.8 mL/min (by C-G formula based on SCr of 0.9 mg/dL).    Allergies  Allergen Reactions  . Vancomycin Other (See Comments)    Drug induced NEUTROPENIA  . Aspirin Other (See Comments)    Stomach bleeding  . Ibuprofen Other (See Comments)    Stomach bleeding  . Mushroom Extract Complex Hives and Itching  . Shellfish Allergy Hives and Itching  . Sulfa Antibiotics Hives and Itching  . Sulfasalazine Itching and Hives  . Tylenol [Acetaminophen] Itching  . Betadine [Povidone Iodine] Itching  . Coconut Flavor Itching  . Codeine Itching  . Eggs Or Egg-Derived Products Nausea And Vomiting  . Iodine Itching  . Ivp Dye [Iodinated Diagnostic Agents] Hives    Takes Benadryl 50mg  PO before receiving iodinated contrast   . Metrizamide Hives    Takes Benadryl 50mg  PO before receiving iodinated contrast     Thank you for allowing  pharmacy to be a part of this patient's care.  Reuel Boom, PharmD, BCPS [phone] 06/16/2018, 8:44 PM

## 2018-06-16 NOTE — ED Provider Notes (Signed)
Received sign out from [employer], PA-C. Pt had a mechanical fall today.  Has acute fracture of the proximal humeral surgical neck and greater tuberosity.  Has L hip pain, initial xray negative.  Currently awaits hip CT to r/o occult fx.    Ortho referral afterward.   5:15 PM CT scan of the left hip demonstrate no acute osseous injury.  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  Patient may discharge home with pain medication as well as outpatient follow-up with orthopedic for further management of her left proximal humeral surgical neck fracture if she is able to ambulate.  6:08 PM Patient continues to endorse significant pain to her lower back.  She is unable to sit up or ambulate secondary to pain.  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  Given history, will obtain basic labs, as well as lumbar MRI for further evaluation of her condition.  Additional pain medication given.  7:03 PM Patient has an elevated lactic acid of 6.14.  This is concerning for potential infection.  She will benefit from a thoracic and lumbar MRI for further evaluation.  Sed rate placed as well as code sepsis initiated.  Patient is allergic to vancomycin.  A year ago when she developed discitis/epidural abscess, patient was placed on cefepime, Flagyl, and daptomycin.  I did discuss this with our pharmacist.  I would need to consult with infectious disease specialist before daptomycin can be given.  7:30 PM Appreciate consultation from ID specialist DR. Megan Salon who recommend empiric treatment with Cefepime and Vanc and ID will be involve in pt's care.    8:22 PM Labs significant for elevated white count of 12.9, repeat lactic acid improved to 4.02 from 6.14 after IVF.  Antibiotics given, patient appears dry, rectal temp is 100.8.  Sepsis - Repeat Assessment  Performed at:    2012  Vitals     Blood pressure (!) 171/121, pulse (!) 128, temperature (!) 100.8 F (38.2 C),  temperature source Rectal, resp. rate (!) 22, height 5\' 3"  (1.6 m), weight 95.3 kg, SpO2 95 %.  Heart:     Tachycardic  Lungs:    CTA  Capillary Refill:   <2 sec  Peripheral Pulse:   Radial pulse palpable  Skin:     Dry   Date: 06/16/2018  Rate: 127  Rhythm: sinus tachycardic  QRS Axis: normal  Intervals: normal  ST/T Wave abnormalities: normal  Conduction Disutrbances: none  Narrative Interpretation:   Old EKG Reviewed: tachycardia noted    CRITICAL CARE Performed by: Domenic Moras Total critical care time: 45 minutes Critical care time was exclusive of separately billable procedures and treating other patients. Critical care was necessary to treat or prevent imminent or life-threatening deterioration. Critical care was time spent personally by me on the following activities: development of treatment plan with patient and/or surrogate as well as nursing, discussions with consultants, evaluation of patient's response to treatment, examination of patient, obtaining history from patient or surrogate, ordering and performing treatments and interventions, ordering and review of laboratory studies, ordering and review of radiographic studies, pulse oximetry and re-evaluation of patient's condition.  BP (!) 169/105   Pulse (!) 127   Temp (!) 100.8 F (38.2 C) (Rectal)   Resp 17   Ht 5\' 3"  (1.6 m)   Wt 95.3 kg   SpO2 96%   BMI 37.20 kg/m   Results for orders placed or performed during the hospital encounter of 06/16/18  CBC  with Differential/Platelet  Result Value Ref Range   WBC 12.9 (H) 4.0 - 10.5 K/uL   RBC 4.95 3.87 - 5.11 MIL/uL   Hemoglobin 12.7 12.0 - 15.0 g/dL   HCT 40.9 36.0 - 46.0 %   MCV 82.6 80.0 - 100.0 fL   MCH 25.7 (L) 26.0 - 34.0 pg   MCHC 31.1 30.0 - 36.0 g/dL   RDW 19.0 (H) 11.5 - 15.5 %   Platelets 147 (L) 150 - 400 K/uL   nRBC 0.0 0.0 - 0.2 %   Neutrophils Relative % 71 %   Neutro Abs 9.1 (H) 1.7 - 7.7 K/uL   Lymphocytes Relative 6 %   Lymphs Abs  0.8 0.7 - 4.0 K/uL   Monocytes Relative 20 %   Monocytes Absolute 2.6 (H) 0.1 - 1.0 K/uL   Eosinophils Relative 0 %   Eosinophils Absolute 0.0 0.0 - 0.5 K/uL   Basophils Relative 0 %   Basophils Absolute 0.0 0.0 - 0.1 K/uL   Immature Granulocytes 3 %   Abs Immature Granulocytes 0.40 (H) 0.00 - 0.07 K/uL  Sedimentation rate  Result Value Ref Range   Sed Rate 15 0 - 22 mm/hr  Comprehensive metabolic panel  Result Value Ref Range   Sodium 142 135 - 145 mmol/L   Potassium 3.9 3.5 - 5.1 mmol/L   Chloride 109 98 - 111 mmol/L   CO2 20 (L) 22 - 32 mmol/L   Glucose, Bld 169 (H) 70 - 99 mg/dL   BUN 8 8 - 23 mg/dL   Creatinine, Ser 1.12 (H) 0.44 - 1.00 mg/dL   Calcium 8.8 (L) 8.9 - 10.3 mg/dL   Total Protein 7.4 6.5 - 8.1 g/dL   Albumin 3.9 3.5 - 5.0 g/dL   AST 41 15 - 41 U/L   ALT 16 0 - 44 U/L   Alkaline Phosphatase 49 38 - 126 U/L   Total Bilirubin 0.6 0.3 - 1.2 mg/dL   GFR calc non Af Amer 49 (L) >60 mL/min   GFR calc Af Amer 57 (L) >60 mL/min   Anion gap 13 5 - 15  I-stat chem 8, ed  Result Value Ref Range   Sodium 141 135 - 145 mmol/L   Potassium 3.6 3.5 - 5.1 mmol/L   Chloride 106 98 - 111 mmol/L   BUN 5 (L) 8 - 23 mg/dL   Creatinine, Ser 0.90 0.44 - 1.00 mg/dL   Glucose, Bld 174 (H) 70 - 99 mg/dL   Calcium, Ion 1.08 (L) 1.15 - 1.40 mmol/L   TCO2 20 (L) 22 - 32 mmol/L   Hemoglobin 13.9 12.0 - 15.0 g/dL   HCT 41.0 36.0 - 46.0 %  I-Stat CG4 Lactic Acid, ED  Result Value Ref Range   Lactic Acid, Venous 6.14 (HH) 0.5 - 1.9 mmol/L   Comment NOTIFIED PHYSICIAN   I-Stat CG4 Lactic Acid, ED  (not at  [facility])  Result Value Ref Range   Lactic Acid, Venous 4.02 (HH) 0.5 - 1.9 mmol/L   Comment NOTIFIED PHYSICIAN   I-Stat CG4 Lactic Acid, ED  (not at  [facility])  Result Value Ref Range   Lactic Acid, Venous 1.08 0.5 - 1.9 mmol/L   Dg Lumbar Spine Complete  Result Date: 06/16/2018 CLINICAL DATA:  Low back pain after fall. EXAM: LUMBAR SPINE - COMPLETE 4+ VIEW COMPARISON:  CT abdomen  pelvis dated January 31, 2018. FINDINGS: Five lumbar type vertebral bodies. Prior L3-S1 posterior decompression and L4-L5 interbody fusion. No acute fracture or  subluxation. Vertebral body heights are preserved. Unchanged trace retrolisthesis at L2-L3 and trace anterolisthesis at L4-L5. Stable mild disc height loss at L2-L3, L3-L4, and L5-S1. Moderate facet arthropathy at L5-S1 IMPRESSION: 1.  No acute osseous abnormality. 2. Stable lumbar spondylosis and postsurgical changes. Electronically Signed   By: Titus Dubin M.D.   On: 06/16/2018 15:42   Ct Hip Left Wo Contrast  Result Date: 06/16/2018 CLINICAL DATA:  Left hip pain. EXAM: CT OF THE LEFT HIP WITHOUT CONTRAST TECHNIQUE: Multidetector CT imaging of the left hip was performed according to the standard protocol. Multiplanar CT image reconstructions were also generated. COMPARISON:  None. FINDINGS: Bones/Joint/Cartilage No fracture or dislocation. Normal alignment. No joint effusion. Mild left hip joint space narrowing with tiny marginal osteophytes. Mild osteoarthritis of bilateral sacroiliac. No aggressive osseous lesion. No periosteal reaction or bone destruction. Ligaments Ligaments are suboptimally evaluated by CT. Muscles and Tendons Muscles are normal. No muscle atrophy. No intramuscular fluid collection or hematoma. Soft tissue No fluid collection or hematoma.  No soft tissue mass. IMPRESSION: 1.  No acute osseous injury of the left hip. 2. 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 Electronically Signed   By: Kathreen Devoid   On: 06/16/2018 17:10   Dg Humerus Left  Result Date: 06/16/2018 CLINICAL DATA:  Left shoulder pain after fall. EXAM: LEFT HUMERUS - 2+ VIEW COMPARISON:  None. FINDINGS: Acute fracture of the proximal humerus involving the surgical neck and greater tuberosity. No dislocation. Moderate acromioclavicular joint space narrowing with bony hypertrophy. Soft tissues are unremarkable. IMPRESSION: Acute fracture of the proximal  humerus surgical neck and greater tuberosity. Electronically Signed   By: Titus Dubin M.D.   On: 06/16/2018 15:30   Dg Hip Unilat With Pelvis 2-3 Views Left  Result Date: 06/16/2018 CLINICAL DATA:  Left hip pain after fall. EXAM: DG HIP (WITH OR WITHOUT PELVIS) 2-3V LEFT COMPARISON:  Pelvis and right hip x-rays dated February 12, 2018. CT abdomen pelvis dated July 25, 2009. FINDINGS: No acute fracture or dislocation. The pubic symphysis and sacroiliac joints are intact. The hip joint spaces are relatively preserved. 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 Partially visualized cortical thickening of the right proximal femoral diaphysis with heterotopic ossification, grossly unchanged dating back to prior CTs from 2010 and likely posttraumatic in etiology. Degenerative changes of the lower lumbar spine. IMPRESSION: 1.  No acute osseous abnormality. Electronically Signed   By: Titus Dubin M.D.   On: 06/16/2018 15:40      9:27 PM Appreciate consultation from [facility] Dr. Marlowe Sax who request Critical Care intensivist to be involved in pt care.  She also request orthopedist to be aware of the shoulder injury.   9:40 PM Appreciate consultation from intensivist, Dr. Delaine Lame who felt that patient can benefit from stepdown instead of ICU as her lactic acid is steadily trending down, and she does not have any significant signs of endorgan damage that will require pressors.  I also appreciate consultation from orthopedist Dr. Griffin Basil who agrees to have his partner see patient tomorrow for further management.  9:45 PM I have spoken with Dr. Marlowe Sax who agrees to admit pt to step down for further evaluation of her sepsis, suspect discitis/epidural abscess who doesn't meet weight requirement for MRI of thoracic and lumbar at this time.     Domenic Moras, PA-C 06/16/18 2147    Daleen Bo, MD 06/17/18 [phone]

## 2018-06-16 NOTE — ED Notes (Signed)
Attempted IV x 2 by this RN and other RN. Patient difficult IV start, putting in consult for IV team.

## 2018-06-16 NOTE — ED Notes (Signed)
Zofran not administered because unable to obtain IV access.

## 2018-06-16 NOTE — ED Notes (Signed)
Bed: [facility] Expected date:  Expected time:  Means of arrival:  Comments: 67yo fall, lower back pain,shoulder pain

## 2018-06-16 NOTE — ED Notes (Signed)
EDP [name] and RN notified about critical lactic result

## 2018-06-16 NOTE — ED Notes (Signed)
PA notified that patient is requesting more pain medication. Patient stated that the morphine IM injection didn't help the pain.

## 2018-06-16 NOTE — ED Notes (Signed)
PA Bowie notified patient has critical lactic acid value of 4.02

## 2018-06-16 NOTE — ED Notes (Signed)
Pt placed on 2L O2 

## 2018-06-17 ENCOUNTER — Inpatient Hospital Stay (HOSPITAL_COMMUNITY): Payer: Medicare Other

## 2018-06-17 DIAGNOSIS — S22070A Wedge compression fracture of T9-T10 vertebra, initial encounter for closed fracture: Secondary | ICD-10-CM | POA: Diagnosis not present

## 2018-06-17 DIAGNOSIS — K661 Hemoperitoneum: Secondary | ICD-10-CM | POA: Diagnosis not present

## 2018-06-17 DIAGNOSIS — S42212A Unspecified displaced fracture of surgical neck of left humerus, initial encounter for closed fracture: Secondary | ICD-10-CM | POA: Diagnosis not present

## 2018-06-17 DIAGNOSIS — I5042 Chronic combined systolic (congestive) and diastolic (congestive) heart failure: Secondary | ICD-10-CM | POA: Diagnosis not present

## 2018-06-17 DIAGNOSIS — S42302A Unspecified fracture of shaft of humerus, left arm, initial encounter for closed fracture: Secondary | ICD-10-CM | POA: Diagnosis not present

## 2018-06-17 DIAGNOSIS — R627 Adult failure to thrive: Secondary | ICD-10-CM | POA: Diagnosis not present

## 2018-06-17 DIAGNOSIS — W19XXXA Unspecified fall, initial encounter: Secondary | ICD-10-CM | POA: Diagnosis not present

## 2018-06-17 DIAGNOSIS — I509 Heart failure, unspecified: Secondary | ICD-10-CM | POA: Diagnosis not present

## 2018-06-17 DIAGNOSIS — Z981 Arthrodesis status: Secondary | ICD-10-CM

## 2018-06-17 DIAGNOSIS — G9341 Metabolic encephalopathy: Secondary | ICD-10-CM | POA: Diagnosis not present

## 2018-06-17 DIAGNOSIS — Z8739 Personal history of other diseases of the musculoskeletal system and connective tissue: Secondary | ICD-10-CM | POA: Diagnosis not present

## 2018-06-17 DIAGNOSIS — I110 Hypertensive heart disease with heart failure: Secondary | ICD-10-CM | POA: Diagnosis not present

## 2018-06-17 DIAGNOSIS — S22079A Unspecified fracture of T9-T10 vertebra, initial encounter for closed fracture: Secondary | ICD-10-CM

## 2018-06-17 DIAGNOSIS — M6282 Rhabdomyolysis: Secondary | ICD-10-CM | POA: Diagnosis not present

## 2018-06-17 DIAGNOSIS — S0990XA Unspecified injury of head, initial encounter: Secondary | ICD-10-CM | POA: Diagnosis not present

## 2018-06-17 DIAGNOSIS — R58 Hemorrhage, not elsewhere classified: Secondary | ICD-10-CM | POA: Diagnosis not present

## 2018-06-17 DIAGNOSIS — I130 Hypertensive heart and chronic kidney disease with heart failure and stage 1 through stage 4 chronic kidney disease, or unspecified chronic kidney disease: Secondary | ICD-10-CM | POA: Diagnosis not present

## 2018-06-17 DIAGNOSIS — S22078A Other fracture of T9-T10 vertebra, initial encounter for closed fracture: Secondary | ICD-10-CM | POA: Diagnosis not present

## 2018-06-17 LAB — CBC
HCT: 37.5 % (ref 36.0–46.0)
HCT: 35.3 % — ABNORMAL LOW (ref 36.0–46.0)
HEMATOCRIT: 35 % — AB (ref 36.0–46.0)
HEMOGLOBIN: 10.3 g/dL — AB (ref 12.0–15.0)
Hemoglobin: 11.1 g/dL — ABNORMAL LOW (ref 12.0–15.0)
Hemoglobin: 10.3 g/dL — ABNORMAL LOW (ref 12.0–15.0)
MCH: 25.2 pg — AB (ref 26.0–34.0)
MCH: 25.4 pg — ABNORMAL LOW (ref 26.0–34.0)
MCH: 25.7 pg — ABNORMAL LOW (ref 26.0–34.0)
MCHC: 29.6 g/dL — ABNORMAL LOW (ref 30.0–36.0)
MCHC: 29.2 g/dL — AB (ref 30.0–36.0)
MCHC: 29.4 g/dL — ABNORMAL LOW (ref 30.0–36.0)
MCV: 85.2 fL (ref 80.0–100.0)
MCV: 86.9 fL (ref 80.0–100.0)
MCV: 87.3 fL (ref 80.0–100.0)
PLATELETS: 137 10*3/uL — AB (ref 150–400)
PLATELETS: 109 10*3/uL — AB (ref 150–400)
PLATELETS: 107 10*3/uL — AB (ref 150–400)
RBC: 4.4 MIL/uL (ref 3.87–5.11)
RBC: 4.06 MIL/uL (ref 3.87–5.11)
RBC: 4.01 MIL/uL (ref 3.87–5.11)
RDW: 19.1 % — AB (ref 11.5–15.5)
RDW: 19.3 % — AB (ref 11.5–15.5)
RDW: 19.3 % — AB (ref 11.5–15.5)
WBC: 12 10*3/uL — ABNORMAL HIGH (ref 4.0–10.5)
WBC: 12.9 10*3/uL — AB (ref 4.0–10.5)
WBC: 10.2 10*3/uL (ref 4.0–10.5)
nRBC: 0 % (ref 0.0–0.2)
nRBC: 0 % (ref 0.0–0.2)
nRBC: 0 % (ref 0.0–0.2)

## 2018-06-17 LAB — URINALYSIS, ROUTINE W REFLEX MICROSCOPIC
Bilirubin Urine: NEGATIVE
Glucose, UA: NEGATIVE mg/dL
Ketones, ur: 20 mg/dL — AB
Nitrite: NEGATIVE
PROTEIN: 30 mg/dL — AB
Specific Gravity, Urine: 1.017 (ref 1.005–1.030)
pH: 5 (ref 5.0–8.0)

## 2018-06-17 LAB — PROCALCITONIN: PROCALCITONIN: 1.77 ng/mL

## 2018-06-17 LAB — MRSA PCR SCREENING: MRSA BY PCR: NEGATIVE

## 2018-06-17 LAB — CREATININE, SERUM
Creatinine, Ser: 1.35 mg/dL — ABNORMAL HIGH (ref 0.44–1.00)
GFR, EST AFRICAN AMERICAN: 45 mL/min — AB (ref >60)
GFR, EST NON AFRICAN AMERICAN: 39 mL/min — AB (ref >60)

## 2018-06-17 MED ORDER — HYDROMORPHONE HCL 1 MG/ML IJ SOLN
0.5000 mg | Freq: Once | INTRAMUSCULAR | Status: AC
  Administered 2018-06-17: 0.5 mg via INTRAVENOUS
  Filled 2018-06-17: qty 1

## 2018-06-17 MED ORDER — PROMETHAZINE HCL 25 MG/ML IJ SOLN
12.5000 mg | Freq: Four times a day (QID) | INTRAMUSCULAR | Status: DC | PRN
  Administered 2018-06-17: 12.5 mg via INTRAVENOUS
  Filled 2018-06-17 (×2): qty 1

## 2018-06-17 MED ORDER — CLONIDINE HCL 0.1 MG PO TABS
0.2000 mg | ORAL_TABLET | Freq: Two times a day (BID) | ORAL | Status: DC
Start: 2018-06-17 — End: 2018-06-19
  Administered 2018-06-17 – 2018-06-19 (×4): 0.2 mg via ORAL
  Filled 2018-06-17 (×4): qty 2

## 2018-06-17 MED ORDER — ONDANSETRON HCL 4 MG/2ML IJ SOLN
4.0000 mg | Freq: Four times a day (QID) | INTRAMUSCULAR | Status: DC | PRN
  Administered 2018-06-20 – 2018-06-23 (×2): 4 mg via INTRAVENOUS
  Filled 2018-06-17 (×2): qty 2

## 2018-06-17 MED ORDER — HYDROMORPHONE HCL 1 MG/ML IJ SOLN
1.0000 mg | INTRAMUSCULAR | Status: AC | PRN
  Administered 2018-06-17 – 2018-06-18 (×3): 1 mg via INTRAVENOUS
  Filled 2018-06-17 (×3): qty 1

## 2018-06-17 MED ORDER — SODIUM CHLORIDE 0.9 % IV SOLN
600.0000 mg | Freq: Every day | INTRAVENOUS | Status: DC
  Filled 2018-06-17: qty 12

## 2018-06-17 NOTE — Consult Note (Signed)
[facility]    Date of Admission:  06/16/2018   Total days of antibiotics: 0          vanco/cefepime/flagyl               Reason for Consult: previous paraspinal abscess    Referring Provider: Erlinda Hong   Assessment: Previous Paraspinal Abscess Retroperitoneal-Pelvic hematoma HTN CHF  Plan: 1. Stop flagyl 2. Change vanco to dapto (for now) due to hx of neutropenia 3. Continue cefepime for now.  4. Await her BCx  Comment- Her abd films and CXR do not show evidence of infection, and her non-contrast CT of spine does not show evidence of recurrence of her spinal infection.  I suspect her fever and elevated WBC are from her retroperitoneal hemorrhage.   Thank you so much for this interesting consult,  Principal Problem:   Sepsis ([place]) Active Problems:   Chronic congestive heart failure (HCC)   Hypertension   Hyperlipidemia   Nausea vomiting and diarrhea   Back pain   Humerus fracture   Thrombocytopenia (HCC)   CKD (chronic kidney disease) stage 3, GFR 30-59 ml/min (HCC)   Depression   Physical deconditioning   . citalopram  40 mg Oral Daily  . cloNIDine  0.2 mg Oral BID  . gabapentin  300 mg Oral QHS  . metoprolol tartrate  25 mg Oral Daily  . pantoprazole  40 mg Oral Daily  . simvastatin  20 mg Oral QPM    HPI: [name] is a 67 y.o. female with hx of T10-12 and L2-3 laminectomies/decompression as well as L4-5 fusion (10-25-15). Her Cx at the time of surgery were Proteus and Klebsiella. She was seen by ID and treated with 6 weeks of ceftriaxone.  She had further hospitalizations and was started on IV vanco which caused her to have worsening neutropenia.  She had f/u imaging done 02-15-15 which showed Paraspinal and other soft tissue shows subcutaneous abscesses been drained with some mild inflammatory change being seen there was some residual dorsal epidural enhancing soft tissue that resulted in severe stenosis at L3 and L4 L4  and L5. It was felt that an epidural abscess could not be excluded from this film (not in [facility] system). She had f/u in ID clinic and was changed from augmentin to ceftin.  She developed worsening back pain and then developed wound drainage. She was admitted 05-18-17 and had removal of L4-5 pedicle screws and rods, L3-4 and L5-S1 laminectomy, re-operative bilateral L2-3 and L4-5 laminectomy, evacuation of epidural abscess and phlegmon. Her cx were (-) She was treated with teflaro but her picc became infected. She was to change back to ceftin (but did not start).  She had f/u with ID on 10-16-17 and was to restart ceftin.  She returns to ED on 10-29 after a fall at home and worsening back pain.  In ED she had temp 100.8, hypertension (up to 245/85), WBC 12.9 and lactate 4 (normal after fluid resuscitation).  She was started on vanco/cefepime/flagyl.  She was found to have non-displaced L humeral fracture (tx- sling).  She had non-contrast CT of T/L spine that showed: 1. Acute minimally displaced fracture extending through the anterior-inferior aspect of the T10 vertebral body as above. No associated listhesis or malalignment. 2. No other acute traumatic injury identified within the thoracic spine. 3. Large central disc osteophyte complex at T11-12 with sequelae of prior decompressive laminectomy at T10-11 and T11-12. 4.  Layering right pleural effusion with bibasilar atelectatic changes.  CT LUMBAR SPINE IMPRESSION 1. No acute traumatic injury within the lumbar spine. 2. Postsurgical changes from prior posterior decompression at L3-4 through L5-S1, with interbody fusion at L4-5. 3. 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 Resultant moderate spinal stenosis at L2-3, with moderate to advanced multilevel foraminal narrowing, most notable at L5-S1 bilaterally.  She also had CT abd/pelvis which showed acute pelvic and retroperitoneal hemorrhage.   Review of  Systems: Review of Systems  Constitutional: Negative for chills and fever.  Gastrointestinal: Negative for constipation and diarrhea.  Genitourinary: Negative for dysuria.  Musculoskeletal: Positive for back pain.  Please see HPI. All other systems reviewed and negative.   Past Medical History:  Diagnosis Date  . Anxiety   . Arthritis    "just about qwhere" (03/17/2015)  . CHF (congestive heart failure) ([place])    [name].......LAST OFFICE VISIT WAS A FEW AGO  . CHF (congestive heart failure) ([place]) 03/06/2015  . Chronic bronchitis ([place])    "just about q yr; use nebulizer prn" (03/17/2015)  . Chronic lower back pain   . Complication of anesthesia    @ [place].....COULDN'T GET HER AWAKE....FOR  HERNIA SURGERY... PLACED ON VENTILATOR; woke up during cyst excision OR on my back"; 07/06/09 VHR: re-intubated in PACU due to hypoventilation, extubated POD#1  . Cyst of right kidney   . Discitis of lumbosacral region 03/06/2015  . 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 GERD (gastroesophageal reflux disease)    takes nexium  . Heart palpitations    takes Metoprolol  . Herpes simplex infection    LEFT  EYE----2 YR AGO  . History of pneumonia   . 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 Hypercholesteremia   . Hypertension    dr [name]  . IBS (irritable bowel syndrome) 03/06/2015  . Intertrigo 09/01/2017  . Klebsiella infection 02/28/2016  . Proteus infection 02/28/2016  . Pruritic condition 04/15/2016  . Sleep difficulties    pt. states she had sleep eval > 10 yrs. ago in [place], no apnea found  . 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 denies  . Zoster 03/06/2015    Social History   Tobacco Use  . Smoking status: Never Smoker  . Smokeless tobacco: Never Used  Substance Use Topics  . Alcohol use: Yes    Comment: "stopped drinking in the 1980's; never drank much"; very rarely  . Drug use: No    Family History  Problem Relation Age of Onset  . Hypertension Mother   .  Hypertension Father   . Hypertension Brother   . Hypertension Daughter   . Hypertension Maternal Grandmother   . Hypertension Maternal Grandfather   . Hypertension Paternal Grandmother   . Hypertension Paternal Grandfather   . Breast cancer Maternal Aunt   . Anesthesia problems Neg Hx   . Heart attack Neg Hx   . Stroke Neg Hx      Medications:  Scheduled: . citalopram  40 mg Oral Daily  . cloNIDine  0.2 mg Oral BID  . gabapentin  300 mg Oral QHS  . metoprolol tartrate  25 mg Oral Daily  . pantoprazole  40 mg Oral Daily  . simvastatin  20 mg Oral QPM    Abtx:  Anti-infectives (From admission, onward)   Start     Dose/Rate Route Frequency Ordered Stop   06/17/18 1400  vancomycin (VANCOCIN) IVPB 1000 mg/200 mL premix  Status:  Discontinued  1,000 mg 200 mL/hr over 60 Minutes Intravenous Every 24 hours 06/16/18 1958 06/16/18 2045   06/17/18 1400  vancomycin (VANCOCIN) IVPB 750 mg/150 ml premix     750 mg 150 mL/hr over 60 Minutes Intravenous Every 24 hours 06/16/18 2045     06/17/18 1000  ceFEPIme (MAXIPIME) 2 g in sodium chloride 0.9 % 100 mL IVPB     2 g 200 mL/hr over 30 Minutes Intravenous Every 12 hours 06/16/18 1958     06/16/18 2000  vancomycin (VANCOCIN) 1,500 mg in sodium chloride 0.9 % 500 mL IVPB     1,500 mg 250 mL/hr over 120 Minutes Intravenous  Once 06/16/18 1958 06/16/18 2315   06/16/18 1915  ceFEPIme (MAXIPIME) 2 g in sodium chloride 0.9 % 100 mL IVPB     2 g 200 mL/hr over 30 Minutes Intravenous  Once 06/16/18 1902 06/16/18 2057   06/16/18 1915  metroNIDAZOLE (FLAGYL) IVPB 500 mg  Status:  Discontinued     500 mg 100 mL/hr over 60 Minutes Intravenous Every 8 hours 06/16/18 1902 06/16/18 1928        OBJECTIVE: Blood pressure (!) 107/46, pulse 66, temperature (!) 100.8 F (38.2 C), temperature source Rectal, resp. rate 20, height 5' 3" (1.6 m), weight 95.3 kg, SpO2 95 %.  Physical Exam  Constitutional: She is oriented to person, place, and time.  She appears well-developed and well-nourished. No distress.  HENT:  Mouth/Throat: No oropharyngeal exudate.  Eyes: Pupils are equal, round, and reactive to light. EOM are normal.  Neck: Normal range of motion. Neck supple.  Cardiovascular: Normal rate, regular rhythm and normal heart sounds.  Pulmonary/Chest: Effort normal and breath sounds normal.  Abdominal: Soft. Bowel sounds are normal. She exhibits no distension. There is no tenderness.  Musculoskeletal: She exhibits no edema.  Lymphadenopathy:    She has no cervical adenopathy.  Neurological: She is alert and oriented to person, place, and time. No sensory deficit. She exhibits normal muscle tone.  Skin: She is not diaphoretic.  Psychiatric: She has a normal mood and affect.  L arm in sling  Lab Results Results for orders placed or performed during the hospital encounter of 06/16/18 (from the past 48 hour(s))  I-stat chem 8, ed     Status: Abnormal   Collection Time: 06/16/18  6:48 PM  Result Value Ref Range   Sodium 141 135 - 145 mmol/L   Potassium 3.6 3.5 - 5.1 mmol/L   Chloride 106 98 - 111 mmol/L   BUN 5 (L) 8 - 23 mg/dL   Creatinine, Ser 0.90 0.44 - 1.00 mg/dL   Glucose, Bld 174 (H) 70 - 99 mg/dL   Calcium, Ion 1.08 (L) 1.15 - 1.40 mmol/L   TCO2 20 (L) 22 - 32 mmol/L   Hemoglobin 13.9 12.0 - 15.0 g/dL   HCT 41.0 36.0 - 46.0 %  I-Stat CG4 Lactic Acid, ED     Status: Abnormal   Collection Time: 06/16/18  6:49 PM  Result Value Ref Range   Lactic Acid, Venous 6.14 (HH) 0.5 - 1.9 mmol/L   Comment NOTIFIED PHYSICIAN   CBC with Differential/Platelet     Status: Abnormal   Collection Time: 06/16/18  7:15 PM  Result Value Ref Range   WBC 12.9 (H) 4.0 - 10.5 K/uL    Comment: WHITE COUNT CONFIRMED ON SMEAR   RBC 4.95 3.87 - 5.11 MIL/uL   Hemoglobin 12.7 12.0 - 15.0 g/dL   HCT 40.9 36.0 - 46.0 %  MCV 82.6 80.0 - 100.0 fL   MCH 25.7 (L) 26.0 - 34.0 pg   MCHC 31.1 30.0 - 36.0 g/dL   RDW 19.0 (H) 11.5 - 15.5 %   Platelets  147 (L) 150 - 400 K/uL   nRBC 0.0 0.0 - 0.2 %   Neutrophils Relative % 71 %   Neutro Abs 9.1 (H) 1.7 - 7.7 K/uL   Lymphocytes Relative 6 %   Lymphs Abs 0.8 0.7 - 4.0 K/uL   Monocytes Relative 20 %   Monocytes Absolute 2.6 (H) 0.1 - 1.0 K/uL   Eosinophils Relative 0 %   Eosinophils Absolute 0.0 0.0 - 0.5 K/uL   Basophils Relative 0 %   Basophils Absolute 0.0 0.0 - 0.1 K/uL   Immature Granulocytes 3 %   Abs Immature Granulocytes 0.40 (H) 0.00 - 0.07 K/uL    Comment: Performed at [facility], [place] [address], [place], [place] [address]  Sedimentation rate     Status: None   Collection Time: 06/16/18  7:21 PM  Result Value Ref Range   Sed Rate 15 0 - 22 mm/hr    Comment: Performed at [facility], [place] [address] [place], [place] [address]  Comprehensive metabolic panel     Status: Abnormal   Collection Time: 06/16/18  7:21 PM  Result Value Ref Range   Sodium 142 135 - 145 mmol/L   Potassium 3.9 3.5 - 5.1 mmol/L   Chloride 109 98 - 111 mmol/L   CO2 20 (L) 22 - 32 mmol/L   Glucose, Bld 169 (H) 70 - 99 mg/dL   BUN 8 8 - 23 mg/dL   Creatinine, Ser 1.12 (H) 0.44 - 1.00 mg/dL   Calcium 8.8 (L) 8.9 - 10.3 mg/dL   Total Protein 7.4 6.5 - 8.1 g/dL   Albumin 3.9 3.5 - 5.0 g/dL   AST 41 15 - 41 U/L   ALT 16 0 - 44 U/L   Alkaline Phosphatase 49 38 - 126 U/L   Total Bilirubin 0.6 0.3 - 1.2 mg/dL   GFR calc non Af Amer 49 (L) >60 mL/min   GFR calc Af Amer 57 (L) >60 mL/min    Comment: (NOTE) The eGFR has been calculated using the CKD EPI equation. This calculation has not been validated in all clinical situations. eGFR's persistently <60 mL/min signify possible Chronic Kidney Disease.    Anion gap 13 5 - 15    Comment: Performed at [facility], [place] [address], [place],  [address]  Urinalysis, Routine w reflex microscopic     Status: Abnormal   Collection Time: 06/16/18  7:38 PM  Result Value Ref Range   Color, Urine  YELLOW YELLOW   APPearance HAZY (A) CLEAR   Specific Gravity, Urine 1.017 1.005 - 1.030   pH 5.0 5.0 - 8.0   Glucose, UA NEGATIVE NEGATIVE mg/dL   Hgb urine dipstick SMALL (A) NEGATIVE   Bilirubin Urine NEGATIVE NEGATIVE   Ketones, ur 20 (A) NEGATIVE mg/dL   Protein, ur 30 (A) NEGATIVE mg/dL   Nitrite NEGATIVE NEGATIVE   Leukocytes, UA SMALL (A) NEGATIVE   RBC / HPF 0-5 0 - 5 RBC/hpf   WBC, UA 0-5 0 - 5 WBC/hpf   Bacteria, UA FEW (A) NONE SEEN   Squamous Epithelial / LPF 0-5 0 - 5    Comment: Performed at [facility], [place] [address], [place], [place] [address]  I-Stat CG4 Lactic Acid, ED  (not  at  [facility])     Status: Abnormal   Collection Time: 06/16/18  7:57 PM  Result Value Ref Range   Lactic Acid, Venous 4.02 (HH) 0.5 - 1.9 mmol/L   Comment NOTIFIED PHYSICIAN   Troponin I     Status: Abnormal   Collection Time: 06/16/18  9:15 PM  Result Value Ref Range   Troponin I 0.03 (HH) <0.03 ng/mL    Comment: CRITICAL RESULT CALLED TO, READ BACK BY AND VERIFIED WITH: OXENSIDENE,J AT 2153 ON 06/16/2018 BY MOSLEY,J Performed at [facility], [place] [address] [place], [place] [address]   I-Stat CG4 Lactic Acid, ED  (not at  [facility])     Status: None   Collection Time: 06/16/18  9:22 PM  Result Value Ref Range   Lactic Acid, Venous 1.08 0.5 - 1.9 mmol/L  Procalcitonin - Baseline     Status: None   Collection Time: 06/16/18 10:14 PM  Result Value Ref Range   Procalcitonin 1.77 ng/mL    Comment:        Interpretation: PCT > 0.5 ng/mL and <= 2 ng/mL: Systemic infection (sepsis) is possible, but other conditions are known to elevate PCT as well. (NOTE)       Sepsis PCT Algorithm           Lower Respiratory Tract                                      Infection PCT Algorithm    ----------------------------     ----------------------------         PCT < 0.25 ng/mL                PCT < 0.10 ng/mL         Strongly encourage             Strongly discourage    discontinuation of antibiotics    initiation of antibiotics    ----------------------------     -----------------------------       PCT 0.25 - 0.50 ng/mL            PCT 0.10 - 0.25 ng/mL               OR       >80% decrease in PCT            Discourage initiation of                                            antibiotics      Encourage discontinuation           of antibiotics    ----------------------------     -----------------------------         PCT >= 0.50 ng/mL              PCT 0.26 - 0.50 ng/mL                AND       <80% decrease in PCT             Encourage initiation of  antibiotics       Encourage continuation           of antibiotics    ----------------------------     -----------------------------        PCT >= 0.50 ng/mL                  PCT > 0.50 ng/mL               AND         increase in PCT                  Strongly encourage                                      initiation of antibiotics    Strongly encourage escalation           of antibiotics                                     -----------------------------                                           PCT <= 0.25 ng/mL                                                 OR                                        > 80% decrease in PCT                                     Discontinue / Do not initiate                                             antibiotics Performed at [place] [address] [place], [place] [address]   CBC     Status: Abnormal   Collection Time: 06/17/18  1:19 AM  Result Value Ref Range   WBC 12.0 (H) 4.0 - 10.5 K/uL   RBC 4.40 3.87 - 5.11 MIL/uL   Hemoglobin 11.1 (L) 12.0 - 15.0 g/dL   HCT 37.5 36.0 - 46.0 %   MCV 85.2 80.0 - 100.0 fL   MCH 25.2 (L) 26.0 - 34.0 pg   MCHC 29.6 (L) 30.0 - 36.0 g/dL   RDW 19.1 (H) 11.5 - 15.5 %   Platelets 137 (L) 150 - 400 K/uL    Comment: REPEATED TO VERIFY SPECIMEN CHECKED FOR CLOTS    nRBC  0.0 0.0 - 0.2 %    Comment: Performed at [facility], [place] [address] [place], [place] [address]  Creatinine, serum     Status: Abnormal   Collection Time: 06/17/18  4:45 AM  Result Value Ref Range  Creatinine, Ser 1.35 (H) 0.44 - 1.00 mg/dL   GFR calc non Af Amer 39 (L) >60 mL/min   GFR calc Af Amer 45 (L) >60 mL/min    Comment: (NOTE) The eGFR has been calculated using the CKD EPI equation. This calculation has not been validated in all clinical situations. eGFR's persistently <60 mL/min signify possible Chronic Kidney Disease. Performed at [facility], [place] [address], [place], [place] [address]   CBC     Status: Abnormal   Collection Time: 06/17/18  4:45 AM  Result Value Ref Range   WBC 12.9 (H) 4.0 - 10.5 K/uL   RBC 4.06 3.87 - 5.11 MIL/uL   Hemoglobin 10.3 (L) 12.0 - 15.0 g/dL   HCT 35.3 (L) 36.0 - 46.0 %   MCV 86.9 80.0 - 100.0 fL   MCH 25.4 (L) 26.0 - 34.0 pg   MCHC 29.2 (L) 30.0 - 36.0 g/dL   RDW 19.3 (H) 11.5 - 15.5 %   Platelets 109 (L) 150 - 400 K/uL    Comment: REPEATED TO VERIFY PLATELET COUNT CONFIRMED BY SMEAR Immature Platelet Fraction may be clinically indicated, consider ordering this additional test [id number]    nRBC 0.0 0.0 - 0.2 %    Comment: Performed at [facility], [place] [address] [place], [place] [address]  CBC     Status: Abnormal   Collection Time: 06/17/18  9:31 AM  Result Value Ref Range   WBC 10.2 4.0 - 10.5 K/uL   RBC 4.01 3.87 - 5.11 MIL/uL   Hemoglobin 10.3 (L) 12.0 - 15.0 g/dL   HCT 35.0 (L) 36.0 - 46.0 %   MCV 87.3 80.0 - 100.0 fL   MCH 25.7 (L) 26.0 - 34.0 pg   MCHC 29.4 (L) 30.0 - 36.0 g/dL   RDW 19.3 (H) 11.5 - 15.5 %   Platelets 107 (L) 150 - 400 K/uL    Comment: Immature Platelet Fraction may be clinically indicated, consider ordering this additional test [id number] CONSISTENT WITH PREVIOUS RESULT    nRBC 0.0 0.0 - 0.2 %    Comment: Performed at [facility], [place] [address], [place], [place] [address]  MRSA PCR Screening     Status: None   Collection Time: 06/17/18 12:07 PM  Result Value Ref Range   MRSA by PCR NEGATIVE NEGATIVE    Comment:        The GeneXpert MRSA Assay (FDA approved for NASAL specimens only), is one component of a comprehensive MRSA colonization surveillance program. It is not intended to diagnose MRSA infection nor to guide or monitor treatment for MRSA infections. Performed at [facility], [place] [address], [place], [place] [address]       Component Value Date/Time   SDES TISSUE LUMBAR 05/18/2017 1614   SPECREQUEST NONE 05/18/2017 1614   CULT No growth aerobically or anaerobically. 05/18/2017 1614   REPTSTATUS 05/23/2017 FINAL 05/18/2017 1614   Ct Abdomen Pelvis Wo Contrast  Result Date: 06/17/2018 CLINICAL DATA:  Initial evaluation for acute trauma, fall. EXAM: CT ABDOMEN AND PELVIS WITHOUT CONTRAST TECHNIQUE: Multidetector CT imaging of the abdomen and pelvis was performed following the standard protocol without IV contrast. COMPARISON:  Prior CT from 01/31/2018. FINDINGS: Lower chest: Small layering right pleural effusion. Bibasilar atelectatic changes. 14 mm densely calcified nodule noted at the left lower lobe. Calcified left hilar adenopathy partially visualized. Findings consistent with prior granulomatous infection. Partially visualized lungs are otherwise clear. Hepatobiliary: Limited noncontrast evaluation of  the liver is unremarkable. Gallbladder peers to be absent. No appreciable biliary dilatation. Pancreas: Pancreas within normal limits. Spleen: Scattered calcified granulomas noted within the spleen. Spleen otherwise grossly unremarkable. Adrenals/Urinary Tract: Adrenal glands grossly unremarkable. Kidneys equal in size 3.2 cm cyst present within the interpolar right kidney. No nephrolithiasis or hydronephrosis. No appreciable hydroureter. 4 mm calcific density  positioned near the level of the right UPJ favored to reflect a vascular phlebolith. Partially distended bladder within normal limits. Stomach/Bowel: Small hiatal hernia noted. Stomach otherwise unremarkable. No evidence for bowel obstruction. Colonic diverticulosis without evidence for acute diverticulitis. No acute inflammatory changes seen about the bowels. Vascular/Lymphatic: Limited noncontrast evaluation of the intra-abdominal aorta are grossly unremarkable. No appreciable aneurysm. No adenopathy. Reproductive: Uterus is absent.  Ovaries not discretely identified. Other: No free intraperitoneal air. Ill-defined stranding within the retroperitoneal space, extending along the psoas musculature bilaterally, concerning for retroperitoneal hematoma. Small amount of hemorrhage present within the central pelvis as well. Sequelae of prior ventral hernia repair noted. Musculoskeletal: Acute fracture involving the anterior-inferior end plate of D22, better evaluated on prior CT of the thoracic spine. No other acute osseous abnormality. Postsurgical changes noted within the thoracolumbar spine. No discrete lytic or blastic osseous lesions. IMPRESSION: 1. Scattered soft tissue stranding within the retroperitoneum and pelvis, consistent with acute hemorrhage, overall small to moderate in volume. No definite visceral injury identified on this noncontrast examination. 2. No other acute traumatic injury within the abdomen and pelvis. 3. Small layering right pleural effusion with associated bibasilar atelectasis. 4. Colonic diverticulosis without evidence for acute diverticulitis. Critical Value/emergent results were called by telephone at the time of interpretation on 06/17/2018 at 12:06 am to Dr. Shela Leff , who verbally acknowledged these results. Electronically Signed   By: Jeannine Boga M.D.   On: 06/17/2018 00:13   Dg Lumbar Spine Complete  Result Date: 06/16/2018 CLINICAL DATA:  Low back pain after  fall. EXAM: LUMBAR SPINE - COMPLETE 4+ VIEW COMPARISON:  CT abdomen pelvis dated January 31, 2018. FINDINGS: Five lumbar type vertebral bodies. Prior L3-S1 posterior decompression and L4-L5 interbody fusion. No acute fracture or subluxation. Vertebral body heights are preserved. Unchanged trace retrolisthesis at L2-L3 and trace anterolisthesis at L4-L5. Stable mild disc height loss at L2-L3, L3-L4, and L5-S1. Moderate facet arthropathy at L5-S1 IMPRESSION: 1.  No acute osseous abnormality. 2. Stable lumbar spondylosis and postsurgical changes. Electronically Signed   By: Titus Dubin M.D.   On: 06/16/2018 15:42   Ct Head Wo Contrast  Result Date: 06/17/2018 CLINICAL DATA:  Encephalopathy. Fell face down yesterday. EXAM: CT HEAD WITHOUT CONTRAST TECHNIQUE: Contiguous axial images were obtained from the base of the skull through the vertex without intravenous contrast. COMPARISON:  None. FINDINGS: Brain: Mild patchy white matter low density in both cerebral hemispheres. Normal size and position of the ventricles. No intracranial hemorrhage, mass lesion or CT evidence of acute infarction. Vascular: No hyperdense vessel or unexpected calcification. Skull: Bilateral hyperostosis frontalis. No skull fractures seen. Sinuses/Orbits: Status post cataract extraction on the left. Unremarkable right orbit and paranasal sinuses. Other: None. IMPRESSION: 1. No acute abnormality. 2. Mild chronic small vessel white matter ischemic changes in both cerebral hemispheres. Electronically Signed   By: Claudie Revering M.D.   On: 06/17/2018 17:02   Ct Thoracic Spine Wo Contrast  Result Date: 06/16/2018 CLINICAL DATA:  Initial evaluation for acute back pain status post fall. EXAM: CT THORACIC AND LUMBAR SPINE WITHOUT CONTRAST TECHNIQUE: Multidetector CT imaging of the thoracic and lumbar  spine was performed without contrast. Multiplanar CT image reconstructions were also generated. COMPARISON:  Prior radiograph from earlier the same  day as well as earlier studies. FINDINGS: CT THORACIC SPINE FINDINGS Alignment: Examination technically limited by body habitus and motion artifact. Vertebral bodies normally aligned with preservation of the normal thoracic kyphosis. No listhesis or malalignment. Vertebrae: Linear lucency extending through the anterior aspect of the T10 vertebral body consistent with acute minimally displaced fracture (9, image 32). Extension through the adjacent inferior endplate and [id number] interspace without appreciable distraction. No significant vertebral body height loss. This is also seen on coronal sequence (series 8, image 24). Possible extension to adjacent pole the endplate osteophytes. Vertebral body heights otherwise maintained with no other definite acute fracture identified. 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 Postsurgical changes from prior posterior decompression at T10-11 and T11-12 noted. Paraspinal and other soft tissues: Chronic postsurgical changes present within the lower posterior paraspinous soft tissues. Paraspinous soft tissues demonstrate no acute abnormality. Layering right pleural effusion partially visualized. Atelectatic changes noted within the lungs bilaterally. Disc levels: T11-12: Large central disc osteophyte complex noted. Prior decompressive laminectomy. Additional multilevel scattered endplate changes noted throughout the mid and lower thoracic spine. Multilevel facet arthropathy noted. No other significant canal stenosis. CT LUMBAR SPINE FINDINGS Segmentation: Examination technically limited by body habitus and motion artifact. Normal segmentation. Lowest well-formed disc labeled the L5-S1 level. Alignment: Mild dextroscoliosis. Trace retrolisthesis of L2 on L3. Alignment otherwise normal with preservation of the normal lumbar lordosis. Vertebrae: Vertebral body heights maintained without evidence for acute or chronic fracture. Postsurgical  changes from prior decompressive laminectomy at L3-4 through L5-S1. Interbody device in place at L4-5. Prior hardware removal at L4 and L5. Probable benign hemangiomas involving the L4 and S1 vertebral bodies noted. No other discrete or worrisome osseous lesions. Paraspinal and other soft tissues: Chronic postsurgical changes present within the lower posterior paraspinous soft tissues. Paraspinous soft tissues demonstrate no acute finding. Disc levels: L1-2: Minimal annular disc bulge. Moderate bilateral facet hypertrophy. No significant stenosis. L2-3: Trace retrolisthesis. Diffuse disc bulge with intervertebral disc space narrowing. Advanced bilateral facet hypertrophy. Resultant moderate canal with moderate to severe bilateral L2 foraminal stenosis. L3-4: Mild disc bulge. Prior decompressive laminectomy. Moderate to advanced bilateral facet hypertrophy. No significant spinal stenosis. Severe left with mild to moderate right L3 foraminal narrowing. L4-5: Prior discectomy with interbody fusion, with posterior decompression. Residual bilateral facet arthrosis. No residual canal stenosis. Mild to moderate bilateral L4 foraminal narrowing. L5-S1: Diffuse disc bulge. Prior posterior decompression. Advanced bilateral facet arthrosis. No significant residual spinal stenosis. Severe bilateral L5 foraminal narrowing. IMPRESSION: CT THORACIC SPINE IMPRESSION 1. Acute minimally displaced fracture extending through the anterior-inferior aspect of the T10 vertebral body as above. No associated listhesis or malalignment. 2. No other acute traumatic injury identified within the thoracic spine. 3. Large central disc osteophyte complex at T11-12 with sequelae of prior decompressive laminectomy at T10-11 and T11-12. 4. Layering right pleural effusion with bibasilar atelectatic changes. CT LUMBAR SPINE IMPRESSION 1. No acute traumatic injury within the lumbar spine. 2. Postsurgical changes from prior posterior decompression at L3-4  through L5-S1, with interbody fusion at L4-5. 3. 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 Resultant moderate spinal stenosis at L2-3, with moderate to advanced multilevel foraminal narrowing, most notable at L5-S1 bilaterally. Electronically Signed   By: Jeannine Boga M.D.   On: 06/16/2018 23:48   Ct Lumbar Spine Wo Contrast  Result Date: 06/16/2018 CLINICAL DATA:  Initial evaluation for acute back pain status post fall. EXAM: CT THORACIC AND LUMBAR SPINE WITHOUT CONTRAST TECHNIQUE: Multidetector CT imaging of the thoracic and lumbar spine was performed without contrast. Multiplanar CT image reconstructions were also generated. COMPARISON:  Prior radiograph from earlier the same day as well as earlier studies. FINDINGS: CT THORACIC SPINE FINDINGS Alignment: Examination technically limited by body habitus and motion artifact. Vertebral bodies normally aligned with preservation of the normal thoracic kyphosis. No listhesis or malalignment. Vertebrae: Linear lucency extending through the anterior aspect of the T10 vertebral body consistent with acute minimally displaced fracture (9, image 32). Extension through the adjacent inferior endplate and [id number] interspace without appreciable distraction. No significant vertebral body height loss. This is also seen on coronal sequence (series 8, image 24). Possible extension to adjacent pole the endplate osteophytes. Vertebral body heights otherwise maintained with no other definite acute fracture identified. 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 Postsurgical changes from prior posterior decompression at T10-11 and T11-12 noted. Paraspinal and other soft tissues: Chronic postsurgical changes present within the lower posterior paraspinous soft tissues. Paraspinous soft tissues demonstrate no acute abnormality. Layering right pleural effusion partially visualized. Atelectatic changes noted  within the lungs bilaterally. Disc levels: T11-12: Large central disc osteophyte complex noted. Prior decompressive laminectomy. Additional multilevel scattered endplate changes noted throughout the mid and lower thoracic spine. Multilevel facet arthropathy noted. No other significant canal stenosis. CT LUMBAR SPINE FINDINGS Segmentation: Examination technically limited by body habitus and motion artifact. Normal segmentation. Lowest well-formed disc labeled the L5-S1 level. Alignment: Mild dextroscoliosis. Trace retrolisthesis of L2 on L3. Alignment otherwise normal with preservation of the normal lumbar lordosis. Vertebrae: Vertebral body heights maintained without evidence for acute or chronic fracture. Postsurgical changes from prior decompressive laminectomy at L3-4 through L5-S1. Interbody device in place at L4-5. Prior hardware removal at L4 and L5. Probable benign hemangiomas involving the L4 and S1 vertebral bodies noted. No other discrete or worrisome osseous lesions. Paraspinal and other soft tissues: Chronic postsurgical changes present within the lower posterior paraspinous soft tissues. Paraspinous soft tissues demonstrate no acute finding. Disc levels: L1-2: Minimal annular disc bulge. Moderate bilateral facet hypertrophy. No significant stenosis. L2-3: Trace retrolisthesis. Diffuse disc bulge with intervertebral disc space narrowing. Advanced bilateral facet hypertrophy. Resultant moderate canal with moderate to severe bilateral L2 foraminal stenosis. L3-4: Mild disc bulge. Prior decompressive laminectomy. Moderate to advanced bilateral facet hypertrophy. No significant spinal stenosis. Severe left with mild to moderate right L3 foraminal narrowing. L4-5: Prior discectomy with interbody fusion, with posterior decompression. Residual bilateral facet arthrosis. No residual canal stenosis. Mild to moderate bilateral L4 foraminal narrowing. L5-S1: Diffuse disc bulge. Prior posterior decompression.  Advanced bilateral facet arthrosis. No significant residual spinal stenosis. Severe bilateral L5 foraminal narrowing. IMPRESSION: CT THORACIC SPINE IMPRESSION 1. Acute minimally displaced fracture extending through the anterior-inferior aspect of the T10 vertebral body as above. No associated listhesis or malalignment. 2. No other acute traumatic injury identified within the thoracic spine. 3. Large central disc osteophyte complex at T11-12 with sequelae of prior decompressive laminectomy at T10-11 and T11-12. 4. Layering right pleural effusion with bibasilar atelectatic changes. CT LUMBAR SPINE IMPRESSION 1. No acute traumatic injury within the lumbar spine. 2. Postsurgical changes from prior posterior decompression at L3-4 through L5-S1, with interbody fusion at L4-5. 3. 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 Resultant moderate spinal stenosis at L2-3, with moderate to advanced multilevel foraminal narrowing, most notable at L5-S1 bilaterally. Electronically Signed  By: Jeannine Boga M.D.   On: 06/16/2018 23:48   Ct Hip Left Wo Contrast  Result Date: 06/16/2018 CLINICAL DATA:  Left hip pain. EXAM: CT OF THE LEFT HIP WITHOUT CONTRAST TECHNIQUE: Multidetector CT imaging of the left hip was performed according to the standard protocol. Multiplanar CT image reconstructions were also generated. COMPARISON:  None. FINDINGS: Bones/Joint/Cartilage No fracture or dislocation. Normal alignment. No joint effusion. Mild left hip joint space narrowing with tiny marginal osteophytes. Mild osteoarthritis of bilateral sacroiliac. No aggressive osseous lesion. No periosteal reaction or bone destruction. Ligaments Ligaments are suboptimally evaluated by CT. Muscles and Tendons Muscles are normal. No muscle atrophy. No intramuscular fluid collection or hematoma. Soft tissue No fluid collection or hematoma.  No soft tissue mass. IMPRESSION: 1.  No acute osseous injury of the left hip.  2. 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 Electronically Signed   By: Kathreen Devoid   On: 06/16/2018 17:10   Dg Chest Port 1 View  Result Date: 06/16/2018 CLINICAL DATA:  Sepsis. EXAM: PORTABLE CHEST 1 VIEW COMPARISON:  05/18/2017 FINDINGS: Lungs are adequately inflated with mild hazy perihilar prominence suggesting mild vascular congestion. No lobar consolidation or effusion. Borderline stable cardiomegaly. Remainder of the exam is unchanged. IMPRESSION: Borderline stable cardiomegaly with suggestion of mild vascular congestion. Electronically Signed   By: Marin Olp M.D.   On: 06/16/2018 22:49   Dg Humerus Left  Result Date: 06/16/2018 CLINICAL DATA:  Left shoulder pain after fall. EXAM: LEFT HUMERUS - 2+ VIEW COMPARISON:  None. FINDINGS: Acute fracture of the proximal humerus involving the surgical neck and greater tuberosity. No dislocation. Moderate acromioclavicular joint space narrowing with bony hypertrophy. Soft tissues are unremarkable. IMPRESSION: Acute fracture of the proximal humerus surgical neck and greater tuberosity. Electronically Signed   By: Titus Dubin M.D.   On: 06/16/2018 15:30   Dg Hip Unilat With Pelvis 2-3 Views Left  Result Date: 06/16/2018 CLINICAL DATA:  Left hip pain after fall. EXAM: DG HIP (WITH OR WITHOUT PELVIS) 2-3V LEFT COMPARISON:  Pelvis and right hip x-rays dated February 12, 2018. CT abdomen pelvis dated July 25, 2009. FINDINGS: No acute fracture or dislocation. The pubic symphysis and sacroiliac joints are intact. The hip joint spaces are relatively preserved. 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 Partially visualized cortical thickening of the right proximal femoral diaphysis with heterotopic ossification, grossly unchanged dating back to prior CTs from 2010 and likely posttraumatic in etiology. Degenerative changes of the lower lumbar spine. IMPRESSION: 1.  No acute osseous abnormality. Electronically  Signed   By: Titus Dubin M.D.   On: 06/16/2018 15:40   Recent Results (from the past 240 hour(s))  MRSA PCR Screening     Status: None   Collection Time: 06/17/18 12:07 PM  Result Value Ref Range Status   MRSA by PCR NEGATIVE NEGATIVE Final    Comment:        The GeneXpert MRSA Assay (FDA approved for NASAL specimens only), is one component of a comprehensive MRSA colonization surveillance program. It is not intended to diagnose MRSA infection nor to guide or monitor treatment for MRSA infections. Performed at [facility], [place] [address] [place], [place] [address]     Microbiology: Recent Results (from the past 240 hour(s))  MRSA PCR Screening     Status: None   Collection Time: 06/17/18 12:07 PM  Result Value Ref Range Status   MRSA by PCR NEGATIVE NEGATIVE Final  Comment:        The GeneXpert MRSA Assay (FDA approved for NASAL specimens only), is one component of a comprehensive MRSA colonization surveillance program. It is not intended to diagnose MRSA infection nor to guide or monitor treatment for MRSA infections. Performed at [facility], [place] [address], [place], [place] [address]     Radiographs and labs were personally reviewed by me.   Bobby Rumpf, MD [facility] [place] [facility] [phone] 06/17/2018, 5:16 PM

## 2018-06-17 NOTE — ED Notes (Signed)
Pt has had no bowel movements today. Per verbal from Dr Erlinda Hong, enteric precautions may be d/c'd.

## 2018-06-17 NOTE — Progress Notes (Signed)
PROGRESS NOTE  [name] HYI:[id number] DOB: 05/13/1951 DOA: 06/16/2018 PCP: [name], MD  Brief history:  Reports lives by herself, she reports tripped adn [place] facing down at home and lay on the floor for more than an hrs  [name] reports patient has progressive weakness in the last few weeks    HPI/Recap of past 24 hours:  Drowsy,  tmax on presentation 100.8 [name] at bedside  Assessment/Plan: Principal Problem:   Sepsis ([place]) Active Problems:   Chronic congestive heart failure (HCC)   Hypertension   Hyperlipidemia   Nausea vomiting and diarrhea   Back pain   Humerus fracture   Thrombocytopenia (HCC)   CKD (chronic kidney disease) stage 3, GFR 30-59 ml/min (HCC)   Depression   Physical deconditioning   Sepsis? -Fever 100.8,sinus tachycardia, heart rate 110-120's on presentation, wbc12.9, lactic acid 6.14 on presentation -UA with few bacteria, she does report abdominal pain, blood culture in process, MRSA screening negative, CT spine imaging there is no concern of infection there -cxr with "Borderline stable cardiomegaly with suggestion of mild vascular congestion" -Patient reported having diarrhea at home, but no BM  In the ED since arrived to the hospital -Infectious disease consulted   acute minimally displaced fracture extending through the anterior-inferior aspect of the T10 vertebral body Admitting MD Dr Marlowe Sax discussed with neurosurgery PA on-call (Mr. Cato Mulligan) consulted who recommended "no surgical intervention is needed at this time.  Recommendation is to use TLSO brace and outpatient neurosurgery follow-up."  Minimally displaced L proximal humerus fracture  ortho Dr Griffin Basil consulted Per Dr Griffin Basil" NWB in sling x6 weeks, followup outpatient when discharged.  No PT or ROM of shoulder.  Sling at all times. OK to move hand and wrist while in sling. "  Acute retroperitoneum hemorrhage: Scattered soft tissue stranding within the  retroperitoneum and pelvis, consistent with acute hemorrhage, overall small to moderate in volume.  Admitting MD Dr Marlowe Sax discussed with general surgery on call Dr Excell Seltzer who states "no surgical intervention is needed at this time." -Monitor blood pressure and hemoglobin  Encephalopathy -She is drowsy slightly confused which is not her baseline -Stat CT scan no acute findings -[name] think this is might be related to pain medication that she received at 5 AM -Taper clonidine  Nausea and vomiting, chronic diarrhea Non observed in the ED so far  ct ab no obstruction   History of combined CHF Last EF in 2015 was 50% with diffuse hyper hypokinesis, and grade 1 diastolic dysfunction -Currently no edema, will hold Lasix for now due to drowsiness and poor oral intake -Close monitor volume status  Hypertension  continue Lopressor,  Blood pressure fluctuating , taper clonidine  hold home meds losartan HCTZ for now, hold Lasix for now  Body mass index is 37.2 kg/m.   FTT: Per [name] patient has been mostly in bed for 94-month due to multiple back issues.  She does walk to the car and go to church on the weekend Will need PT OT eval Patient adamantly does not want to go to SNF   Code Status: full  Family Communication: patient and [name] at bedside  Disposition Plan: not ready to discharge   Consultants:  ID  Procedures:  none  Antibiotics:  Vanc/cefepime on presentation   Objective: BP (!) 123/54   Pulse 69   Temp (!) 100.8 F (38.2 C) (Rectal)   Resp 18   Ht 5\' 3"  (1.6 m)   Wt 95.3 kg   SpO2 100%  BMI 37.20 kg/m   Intake/Output Summary (Last 24 hours) at 06/17/2018 1210 Last data filed at 06/16/2018 2315 Gross per 24 hour  Intake 3602.18 ml  Output -  Net 3602.18 ml   Filed Weights   06/16/18 1308  Weight: 95.3 kg    Exam: Patient is examined daily including today on 06/17/2018, exams remain the same as of yesterday except that has  changed    General:  Drowsy, slightly confused, though able to self correct  Cardiovascular: RRR  Respiratory: CTABL  Abdomen: Soft/ND/NT, positive BS  Musculoskeletal: No Edema, not able to lift left leg against gravity  Neuro: drowsy  Data Reviewed: Basic Metabolic Panel: Recent Labs  Lab 06/16/18 1848 06/16/18 1921 06/17/18 0445  NA 141 142  --   K 3.6 3.9  --   CL 106 109  --   CO2  --  20*  --   GLUCOSE 174* 169*  --   BUN 5* 8  --   CREATININE 0.90 1.12* 1.35*  CALCIUM  --  8.8*  --    Liver Function Tests: Recent Labs  Lab 06/16/18 1921  AST 41  ALT 16  ALKPHOS 49  BILITOT 0.6  PROT 7.4  ALBUMIN 3.9   No results for input(s): LIPASE, AMYLASE in the last 168 hours. No results for input(s): AMMONIA in the last 168 hours. CBC: Recent Labs  Lab 06/16/18 1848 06/16/18 1915 06/17/18 0119 06/17/18 0445 06/17/18 0931  WBC  --  12.9* 12.0* 12.9* 10.2  NEUTROABS  --  9.1*  --   --   --   HGB 13.9 12.7 11.1* 10.3* 10.3*  HCT 41.0 40.9 37.5 35.3* 35.0*  MCV  --  82.6 85.2 86.9 87.3  PLT  --  147* 137* 109* 107*   Cardiac Enzymes:   Recent Labs  Lab 06/16/18 2115  TROPONINI 0.03*   BNP (last 3 results) No results for input(s): BNP in the last 8760 hours.  ProBNP (last 3 results) No results for input(s): PROBNP in the last 8760 hours.  CBG: No results for input(s): GLUCAP in the last 168 hours.  No results found for this or any previous visit (from the past 240 hour(s)).   Studies: Ct Abdomen Pelvis Wo Contrast  Result Date: 06/17/2018 CLINICAL DATA:  Initial evaluation for acute trauma, fall. EXAM: CT ABDOMEN AND PELVIS WITHOUT CONTRAST TECHNIQUE: Multidetector CT imaging of the abdomen and pelvis was performed following the standard protocol without IV contrast. COMPARISON:  Prior CT from 01/31/2018. FINDINGS: Lower chest: Small layering right pleural effusion. Bibasilar atelectatic changes. 14 mm densely calcified nodule noted at the left  lower lobe. Calcified left hilar adenopathy partially visualized. Findings consistent with prior granulomatous infection. Partially visualized lungs are otherwise clear. Hepatobiliary: Limited noncontrast evaluation of the liver is unremarkable. Gallbladder peers to be absent. No appreciable biliary dilatation. Pancreas: Pancreas within normal limits. Spleen: Scattered calcified granulomas noted within the spleen. Spleen otherwise grossly unremarkable. Adrenals/Urinary Tract: Adrenal glands grossly unremarkable. Kidneys equal in size 3.2 cm cyst present within the interpolar right kidney. No nephrolithiasis or hydronephrosis. No appreciable hydroureter. 4 mm calcific density positioned near the level of the right UPJ favored to reflect a vascular phlebolith. Partially distended bladder within normal limits. Stomach/Bowel: Small hiatal hernia noted. Stomach otherwise unremarkable. No evidence for bowel obstruction. Colonic diverticulosis without evidence for acute diverticulitis. No acute inflammatory changes seen about the bowels. Vascular/Lymphatic: Limited noncontrast evaluation of the intra-abdominal aorta are grossly unremarkable. No appreciable aneurysm. No  adenopathy. Reproductive: Uterus is absent.  Ovaries not discretely identified. Other: No free intraperitoneal air. Ill-defined stranding within the retroperitoneal space, extending along the psoas musculature bilaterally, concerning for retroperitoneal hematoma. Small amount of hemorrhage present within the central pelvis as well. Sequelae of prior ventral hernia repair noted. Musculoskeletal: Acute fracture involving the anterior-inferior end plate of U44, better evaluated on prior CT of the thoracic spine. No other acute osseous abnormality. Postsurgical changes noted within the thoracolumbar spine. No discrete lytic or blastic osseous lesions. IMPRESSION: 1. Scattered soft tissue stranding within the retroperitoneum and pelvis, consistent with acute  hemorrhage, overall small to moderate in volume. No definite visceral injury identified on this noncontrast examination. 2. No other acute traumatic injury within the abdomen and pelvis. 3. Small layering right pleural effusion with associated bibasilar atelectasis. 4. Colonic diverticulosis without evidence for acute diverticulitis. Critical Value/emergent results were called by telephone at the time of interpretation on 06/17/2018 at 12:06 am to Dr. Shela Leff , who verbally acknowledged these results. Electronically Signed   By: Jeannine Boga M.D.   On: 06/17/2018 00:13   Dg Lumbar Spine Complete  Result Date: 06/16/2018 CLINICAL DATA:  Low back pain after fall. EXAM: LUMBAR SPINE - COMPLETE 4+ VIEW COMPARISON:  CT abdomen pelvis dated January 31, 2018. FINDINGS: Five lumbar type vertebral bodies. Prior L3-S1 posterior decompression and L4-L5 interbody fusion. No acute fracture or subluxation. Vertebral body heights are preserved. Unchanged trace retrolisthesis at L2-L3 and trace anterolisthesis at L4-L5. Stable mild disc height loss at L2-L3, L3-L4, and L5-S1. Moderate facet arthropathy at L5-S1 IMPRESSION: 1.  No acute osseous abnormality. 2. Stable lumbar spondylosis and postsurgical changes. Electronically Signed   By: Titus Dubin M.D.   On: 06/16/2018 15:42   Ct Thoracic Spine Wo Contrast  Result Date: 06/16/2018 CLINICAL DATA:  Initial evaluation for acute back pain status post fall. EXAM: CT THORACIC AND LUMBAR SPINE WITHOUT CONTRAST TECHNIQUE: Multidetector CT imaging of the thoracic and lumbar spine was performed without contrast. Multiplanar CT image reconstructions were also generated. COMPARISON:  Prior radiograph from earlier the same day as well as earlier studies. FINDINGS: CT THORACIC SPINE FINDINGS Alignment: Examination technically limited by body habitus and motion artifact. Vertebral bodies normally aligned with preservation of the normal thoracic kyphosis. No  listhesis or malalignment. Vertebrae: Linear lucency extending through the anterior aspect of the T10 vertebral body consistent with acute minimally displaced fracture (9, image 32). Extension through the adjacent inferior endplate and [id number] interspace without appreciable distraction. No significant vertebral body height loss. This is also seen on coronal sequence (series 8, image 24). Possible extension to adjacent pole the endplate osteophytes. Vertebral body heights otherwise maintained with no other definite acute fracture identified. 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 Postsurgical changes from prior posterior decompression at T10-11 and T11-12 noted. Paraspinal and other soft tissues: Chronic postsurgical changes present within the lower posterior paraspinous soft tissues. Paraspinous soft tissues demonstrate no acute abnormality. Layering right pleural effusion partially visualized. Atelectatic changes noted within the lungs bilaterally. Disc levels: T11-12: Large central disc osteophyte complex noted. Prior decompressive laminectomy. Additional multilevel scattered endplate changes noted throughout the mid and lower thoracic spine. Multilevel facet arthropathy noted. No other significant canal stenosis. CT LUMBAR SPINE FINDINGS Segmentation: Examination technically limited by body habitus and motion artifact. Normal segmentation. Lowest well-formed disc labeled the L5-S1 level. Alignment: Mild dextroscoliosis. Trace retrolisthesis of L2 on L3. Alignment otherwise normal with preservation of the normal lumbar lordosis.  Vertebrae: Vertebral body heights maintained without evidence for acute or chronic fracture. Postsurgical changes from prior decompressive laminectomy at L3-4 through L5-S1. Interbody device in place at L4-5. Prior hardware removal at L4 and L5. Probable benign hemangiomas involving the L4 and S1 vertebral bodies noted. No other discrete or  worrisome osseous lesions. Paraspinal and other soft tissues: Chronic postsurgical changes present within the lower posterior paraspinous soft tissues. Paraspinous soft tissues demonstrate no acute finding. Disc levels: L1-2: Minimal annular disc bulge. Moderate bilateral facet hypertrophy. No significant stenosis. L2-3: Trace retrolisthesis. Diffuse disc bulge with intervertebral disc space narrowing. Advanced bilateral facet hypertrophy. Resultant moderate canal with moderate to severe bilateral L2 foraminal stenosis. L3-4: Mild disc bulge. Prior decompressive laminectomy. Moderate to advanced bilateral facet hypertrophy. No significant spinal stenosis. Severe left with mild to moderate right L3 foraminal narrowing. L4-5: Prior discectomy with interbody fusion, with posterior decompression. Residual bilateral facet arthrosis. No residual canal stenosis. Mild to moderate bilateral L4 foraminal narrowing. L5-S1: Diffuse disc bulge. Prior posterior decompression. Advanced bilateral facet arthrosis. No significant residual spinal stenosis. Severe bilateral L5 foraminal narrowing. IMPRESSION: CT THORACIC SPINE IMPRESSION 1. Acute minimally displaced fracture extending through the anterior-inferior aspect of the T10 vertebral body as above. No associated listhesis or malalignment. 2. No other acute traumatic injury identified within the thoracic spine. 3. Large central disc osteophyte complex at T11-12 with sequelae of prior decompressive laminectomy at T10-11 and T11-12. 4. Layering right pleural effusion with bibasilar atelectatic changes. CT LUMBAR SPINE IMPRESSION 1. No acute traumatic injury within the lumbar spine. 2. Postsurgical changes from prior posterior decompression at L3-4 through L5-S1, with interbody fusion at L4-5. 3. 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 Resultant moderate spinal stenosis at L2-3, with moderate to advanced multilevel foraminal narrowing, most notable  at L5-S1 bilaterally. Electronically Signed   By: Jeannine Boga M.D.   On: 06/16/2018 23:48   Ct Lumbar Spine Wo Contrast  Result Date: 06/16/2018 CLINICAL DATA:  Initial evaluation for acute back pain status post fall. EXAM: CT THORACIC AND LUMBAR SPINE WITHOUT CONTRAST TECHNIQUE: Multidetector CT imaging of the thoracic and lumbar spine was performed without contrast. Multiplanar CT image reconstructions were also generated. COMPARISON:  Prior radiograph from earlier the same day as well as earlier studies. FINDINGS: CT THORACIC SPINE FINDINGS Alignment: Examination technically limited by body habitus and motion artifact. Vertebral bodies normally aligned with preservation of the normal thoracic kyphosis. No listhesis or malalignment. Vertebrae: Linear lucency extending through the anterior aspect of the T10 vertebral body consistent with acute minimally displaced fracture (9, image 32). Extension through the adjacent inferior endplate and [id number] interspace without appreciable distraction. No significant vertebral body height loss. This is also seen on coronal sequence (series 8, image 24). Possible extension to adjacent pole the endplate osteophytes. Vertebral body heights otherwise maintained with no other definite acute fracture identified. 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 Postsurgical changes from prior posterior decompression at T10-11 and T11-12 noted. Paraspinal and other soft tissues: Chronic postsurgical changes present within the lower posterior paraspinous soft tissues. Paraspinous soft tissues demonstrate no acute abnormality. Layering right pleural effusion partially visualized. Atelectatic changes noted within the lungs bilaterally. Disc levels: T11-12: Large central disc osteophyte complex noted. Prior decompressive laminectomy. Additional multilevel scattered endplate changes noted throughout the mid and lower thoracic spine. Multilevel  facet arthropathy noted. No other significant canal stenosis. CT LUMBAR SPINE FINDINGS Segmentation: Examination technically limited by body habitus and motion artifact. Normal  segmentation. Lowest well-formed disc labeled the L5-S1 level. Alignment: Mild dextroscoliosis. Trace retrolisthesis of L2 on L3. Alignment otherwise normal with preservation of the normal lumbar lordosis. Vertebrae: Vertebral body heights maintained without evidence for acute or chronic fracture. Postsurgical changes from prior decompressive laminectomy at L3-4 through L5-S1. Interbody device in place at L4-5. Prior hardware removal at L4 and L5. Probable benign hemangiomas involving the L4 and S1 vertebral bodies noted. No other discrete or worrisome osseous lesions. Paraspinal and other soft tissues: Chronic postsurgical changes present within the lower posterior paraspinous soft tissues. Paraspinous soft tissues demonstrate no acute finding. Disc levels: L1-2: Minimal annular disc bulge. Moderate bilateral facet hypertrophy. No significant stenosis. L2-3: Trace retrolisthesis. Diffuse disc bulge with intervertebral disc space narrowing. Advanced bilateral facet hypertrophy. Resultant moderate canal with moderate to severe bilateral L2 foraminal stenosis. L3-4: Mild disc bulge. Prior decompressive laminectomy. Moderate to advanced bilateral facet hypertrophy. No significant spinal stenosis. Severe left with mild to moderate right L3 foraminal narrowing. L4-5: Prior discectomy with interbody fusion, with posterior decompression. Residual bilateral facet arthrosis. No residual canal stenosis. Mild to moderate bilateral L4 foraminal narrowing. L5-S1: Diffuse disc bulge. Prior posterior decompression. Advanced bilateral facet arthrosis. No significant residual spinal stenosis. Severe bilateral L5 foraminal narrowing. IMPRESSION: CT THORACIC SPINE IMPRESSION 1. Acute minimally displaced fracture extending through the anterior-inferior aspect of  the T10 vertebral body as above. No associated listhesis or malalignment. 2. No other acute traumatic injury identified within the thoracic spine. 3. Large central disc osteophyte complex at T11-12 with sequelae of prior decompressive laminectomy at T10-11 and T11-12. 4. Layering right pleural effusion with bibasilar atelectatic changes. CT LUMBAR SPINE IMPRESSION 1. No acute traumatic injury within the lumbar spine. 2. Postsurgical changes from prior posterior decompression at L3-4 through L5-S1, with interbody fusion at L4-5. 3. 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 Resultant moderate spinal stenosis at L2-3, with moderate to advanced multilevel foraminal narrowing, most notable at L5-S1 bilaterally. Electronically Signed   By: Jeannine Boga M.D.   On: 06/16/2018 23:48   Ct Hip Left Wo Contrast  Result Date: 06/16/2018 CLINICAL DATA:  Left hip pain. EXAM: CT OF THE LEFT HIP WITHOUT CONTRAST TECHNIQUE: Multidetector CT imaging of the left hip was performed according to the standard protocol. Multiplanar CT image reconstructions were also generated. COMPARISON:  None. FINDINGS: Bones/Joint/Cartilage No fracture or dislocation. Normal alignment. No joint effusion. Mild left hip joint space narrowing with tiny marginal osteophytes. Mild osteoarthritis of bilateral sacroiliac. No aggressive osseous lesion. No periosteal reaction or bone destruction. Ligaments Ligaments are suboptimally evaluated by CT. Muscles and Tendons Muscles are normal. No muscle atrophy. No intramuscular fluid collection or hematoma. Soft tissue No fluid collection or hematoma.  No soft tissue mass. IMPRESSION: 1.  No acute osseous injury of the left hip. 2. 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 Electronically Signed   By: Kathreen Devoid   On: 06/16/2018 17:10   Dg Chest Port 1 View  Result Date: 06/16/2018 CLINICAL DATA:  Sepsis. EXAM: PORTABLE CHEST 1 VIEW COMPARISON:  05/18/2017 FINDINGS:  Lungs are adequately inflated with mild hazy perihilar prominence suggesting mild vascular congestion. No lobar consolidation or effusion. Borderline stable cardiomegaly. Remainder of the exam is unchanged. IMPRESSION: Borderline stable cardiomegaly with suggestion of mild vascular congestion. Electronically Signed   By: Marin Olp M.D.   On: 06/16/2018 22:49   Dg Humerus Left  Result Date: 06/16/2018 CLINICAL DATA:  Left shoulder pain after fall. EXAM: LEFT HUMERUS -  2+ VIEW COMPARISON:  None. FINDINGS: Acute fracture of the proximal humerus involving the surgical neck and greater tuberosity. No dislocation. Moderate acromioclavicular joint space narrowing with bony hypertrophy. Soft tissues are unremarkable. IMPRESSION: Acute fracture of the proximal humerus surgical neck and greater tuberosity. Electronically Signed   By: Titus Dubin M.D.   On: 06/16/2018 15:30   Dg Hip Unilat With Pelvis 2-3 Views Left  Result Date: 06/16/2018 CLINICAL DATA:  Left hip pain after fall. EXAM: DG HIP (WITH OR WITHOUT PELVIS) 2-3V LEFT COMPARISON:  Pelvis and right hip x-rays dated February 12, 2018. CT abdomen pelvis dated July 25, 2009. FINDINGS: No acute fracture or dislocation. The pubic symphysis and sacroiliac joints are intact. The hip joint spaces are relatively preserved. 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 Partially visualized cortical thickening of the right proximal femoral diaphysis with heterotopic ossification, grossly unchanged dating back to prior CTs from 2010 and likely posttraumatic in etiology. Degenerative changes of the lower lumbar spine. IMPRESSION: 1.  No acute osseous abnormality. Electronically Signed   By: Titus Dubin M.D.   On: 06/16/2018 15:40    Scheduled Meds: . citalopram  40 mg Oral Daily  . cloNIDine  0.3 mg Oral BID  . gabapentin  300 mg Oral QHS  . metoprolol tartrate  25 mg Oral Daily  . pantoprazole  40 mg Oral Daily   . simvastatin  20 mg Oral QPM    Continuous Infusions: . ceFEPime (MAXIPIME) IV 2 g (06/17/18 1100)  . vancomycin       Time spent: 11mins I have personally reviewed and interpreted on  06/17/2018 daily labs, tele strips, imagings as discussed above under date review session and assessment and plans.  I reviewed all nursing notes, pharmacy notes, consultant notes,  vitals, pertinent old records  I have discussed plan of care as described above with RN , patient and family on 06/17/2018   Florencia Reasons MD, PhD  [facility] Pager [phone]. If 7PM-7AM, please contact night-coverage at [email or web address], password [facility] 06/17/2018, 12:10 PM  LOS: 1 day

## 2018-06-17 NOTE — Progress Notes (Signed)
PT Cancellation Note  Patient Details Name: [name] MRN: [id number] DOB: 06/12/1951   Cancelled Treatment:    Reason Eval/Treat Not Completed: Pain limiting ability to participate , waiting for bed, per RN, in pain.  Will check back tomorrow. Recommend OT consult also.   Claretha Cooper 06/17/2018, 3:04 PM  [place] Pager [phone] Office [phone]

## 2018-06-17 NOTE — ED Notes (Signed)
Candace Boots MD will reviewed head CT, If nothing acute, she plans to downgrade her to Tele.

## 2018-06-17 NOTE — Progress Notes (Signed)
PT Cancellation Note  Patient Details Name: [name] MRN: [id number] DOB: [birth year]   Cancelled Treatment:     Note that patient is to get a TLSO. Staff may page PT at [phone] when it arrives and PT will come evaluate patient. Thank you. [place] Pager [phone] Office [phone]    Claretha Cooper 06/17/2018, 7:33 AM

## 2018-06-17 NOTE — Progress Notes (Signed)
Per Dr Erlinda Hong, back brace only needs to be worn when pt is up out of bed and not while pt is laying in bed.

## 2018-06-17 NOTE — ED Notes (Signed)
Called Print production planner [phone] 

## 2018-06-17 NOTE — Progress Notes (Addendum)
Pharmacy Antibiotic Note  [name] is a 67 y.o. female admitted on 06/16/2018 with fall at home.  She had fever, hx of paraspinal abscess.  Pharmacy has been consulted for Cefepime and Daptomycin dosing.  Plan:  Continue Cefepime 2g IV q12h  Daptomycin 6 mg/kg (600mg ) IV q24h, first dose on 10/31 at 1400  CK weekly  MD, consider holding simvastatin during prolonged Daptomycin use.  Daptomycin and a statin may increase the risk of rhabdomyolysis. Follow up renal fxn, culture results, and clinical course.  F/u ability to de-escalate antibiotics.  Height: 5\' 3"  (160 cm) Weight: 210 lb (95.3 kg) IBW/kg (Calculated) : 52.4  Temp (24hrs), Avg:100.4 F (38 C), Min:99.9 F (37.7 C), Max:100.8 F (38.2 C)  Recent Labs  Lab 06/16/18 1848 06/16/18 1849 06/16/18 1915 06/16/18 1921 06/16/18 1957 06/16/18 2122 06/17/18 0119 06/17/18 0445 06/17/18 0931  WBC  --   --  12.9*  --   --   --  12.0* 12.9* 10.2  CREATININE 0.90  --   --  1.12*  --   --   --  1.35*  --   LATICACIDVEN  --  6.14*  --   --  4.02* 1.08  --   --   --     Estimated Creatinine Clearance: 43.2 mL/min (A) (by C-G formula based on SCr of 1.35 mg/dL (H)).    Allergies  Allergen Reactions  . Vancomycin Other (See Comments)    Drug induced NEUTROPENIA  . Aspirin Other (See Comments)    Stomach bleeding  . Ibuprofen Other (See Comments)    Stomach bleeding  . Mushroom Extract Complex Hives and Itching  . Shellfish Allergy Hives and Itching  . Sulfa Antibiotics Hives and Itching  . Sulfasalazine Itching and Hives  . Tylenol [Acetaminophen] Itching  . Betadine [Povidone Iodine] Itching  . Coconut Flavor Itching  . Codeine Itching  . Eggs Or Egg-Derived Products Nausea And Vomiting  . Iodine Itching  . Ivp Dye [Iodinated Diagnostic Agents] Hives    Takes Benadryl 50mg  PO before receiving iodinated contrast   . Metrizamide Hives    Takes Benadryl 50mg  PO before receiving iodinated contrast     Antimicrobials this admission: 10/29 vanc >> 10/30 10/29 cefepime >>  10/29 metronidazole >> 10/30 10/30 Daptomycin >>   Dose adjustments this admission:   Microbiology results: 10/29 UCx:  10/30 BCx: 10/30 MRSA PCR: negative   Thank you for allowing pharmacy to be a part of this patient's care.  Gretta Arab PharmD, BCPS Pager [phone] 06/17/2018 7:32 PM

## 2018-06-17 NOTE — ED Notes (Signed)
ED TO INPATIENT HANDOFF REPORT  Name/Age/Gender [name] 67 y.o. female  Code Status    Code Status Orders  (From admission, onward)         Start     Ordered   06/16/18 2215  Full code  Continuous     06/16/18 2222        Code Status History    Date Active Date Inactive Code Status Order ID Comments User Context   05/26/2017 1727 06/13/2017 1813 Full Code [id number]  Ditty, Kevan Ny, MD Inpatient   05/18/2017 2057 05/24/2017 2158 Full Code [id number]  Elpidio Eric Inpatient   12/04/2015 2323 12/08/2015 2113 Full Code [id number]  Toy Baker, MD Inpatient   11/02/2015 1740 11/09/2015 2236 Full Code [id number]  Leeroy Cha, MD Inpatient   03/16/2015 1549 03/20/2015 1504 Full Code [id number]  Candace Mainland, DO ED      Home/SNF/Other Home  Chief Complaint back, left shoulder pain   Level of Care/Admitting Diagnosis ED Disposition    ED Disposition Condition [place] [facility] Area: [facility] [[id number]]  Level of Care: Stepdown [14]  Admit to SDU based on following criteria: Severe physiological/psychological symptoms:  Any diagnosis requiring assessment & intervention at least every 4 hours on an ongoing basis to obtain desired patient outcomes including stability and rehabilitation  Diagnosis: Sepsis [facility]) [[id number]]  Admitting Physician: Shela Leff [[id number]]  Attending Physician: Shela Leff [[id number]]  Estimated length of stay: past midnight tomorrow  Certification:: I certify this patient will need inpatient services for at least 2 midnights  PT Class (Do Not Modify): Inpatient [101]  PT Acc Code (Do Not Modify): Private [1]       Medical History Past Medical History:  Diagnosis Date  . Anxiety   . Arthritis    "just about qwhere" (03/17/2015)  . CHF (congestive heart failure) ([place])    [name].......LAST OFFICE VISIT WAS A FEW AGO  . CHF (congestive heart failure) ([place]) 03/06/2015  .  Chronic bronchitis ([place])    "just about q yr; use nebulizer prn" (03/17/2015)  . Chronic lower back pain   . Complication of anesthesia    @ [place].....COULDN'T GET HER AWAKE....FOR  HERNIA SURGERY... PLACED ON VENTILATOR; woke up during cyst excision OR on my back"; 07/06/09 VHR: re-intubated in PACU due to hypoventilation, extubated POD#1  . Cyst of right kidney   . Discitis of lumbosacral region 03/06/2015  . 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 GERD (gastroesophageal reflux disease)    takes nexium  . Heart palpitations    takes Metoprolol  . Herpes simplex infection    LEFT  EYE----2 YR AGO  . History of pneumonia   . 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 Hypercholesteremia   . Hypertension    dr [name]  . IBS (irritable bowel syndrome) 03/06/2015  . Intertrigo 09/01/2017  . Klebsiella infection 02/28/2016  . Proteus infection 02/28/2016  . Pruritic condition 04/15/2016  . Sleep difficulties    pt. states she had sleep eval > 10 yrs. ago in [place], no apnea found  . 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 denies  . Zoster 03/06/2015    Allergies Allergies  Allergen Reactions  . Vancomycin Other (See Comments)    Drug induced NEUTROPENIA  . Aspirin Other (See Comments)    Stomach bleeding  . Ibuprofen Other (See Comments)    Stomach bleeding  . Mushroom Extract Complex Hives and Itching  .  Shellfish Allergy Hives and Itching  . Sulfa Antibiotics Hives and Itching  . Sulfasalazine Itching and Hives  . Tylenol [Acetaminophen] Itching  . Betadine [Povidone Iodine] Itching  . Coconut Flavor Itching  . Codeine Itching  . Eggs Or Egg-Derived Products Nausea And Vomiting  . Iodine Itching  . Ivp Dye [Iodinated Diagnostic Agents] Hives    Takes Benadryl '50mg'$  PO before receiving iodinated contrast   . Metrizamide Hives    Takes Benadryl '50mg'$  PO before receiving iodinated contrast    IV Location/Drains/Wounds Patient Lines/Drains/Airways Status   Active  Line/Drains/Airways    Name:   Placement date:   Placement time:   Site:   Days:   Peripheral IV 06/16/18 Anterior;Proximal;Right Forearm   06/16/18    1635    Forearm   1   Peripheral IV 06/16/18 Right Forearm   06/16/18    1930    Forearm   1   PICC Single Lumen 38/75/64 PICC Right Basilic 39 cm   33/29/51    8841    Basilic   660   Incision (Closed) 06/02/17 Back   06/02/17    1008     380          Labs/Imaging Results for orders placed or performed during the hospital encounter of 06/16/18 (from the past 48 hour(s))  I-stat chem 8, ed     Status: Abnormal   Collection Time: 06/16/18  6:48 PM  Result Value Ref Range   Sodium 141 135 - 145 mmol/L   Potassium 3.6 3.5 - 5.1 mmol/L   Chloride 106 98 - 111 mmol/L   BUN 5 (L) 8 - 23 mg/dL   Creatinine, Ser 0.90 0.44 - 1.00 mg/dL   Glucose, Bld 174 (H) 70 - 99 mg/dL   Calcium, Ion 1.08 (L) 1.15 - 1.40 mmol/L   TCO2 20 (L) 22 - 32 mmol/L   Hemoglobin 13.9 12.0 - 15.0 g/dL   HCT 41.0 36.0 - 46.0 %  I-Stat CG4 Lactic Acid, ED     Status: Abnormal   Collection Time: 06/16/18  6:49 PM  Result Value Ref Range   Lactic Acid, Venous 6.14 (HH) 0.5 - 1.9 mmol/L   Comment NOTIFIED PHYSICIAN   CBC with Differential/Platelet     Status: Abnormal   Collection Time: 06/16/18  7:15 PM  Result Value Ref Range   WBC 12.9 (H) 4.0 - 10.5 K/uL    Comment: WHITE COUNT CONFIRMED ON SMEAR   RBC 4.95 3.87 - 5.11 MIL/uL   Hemoglobin 12.7 12.0 - 15.0 g/dL   HCT 40.9 36.0 - 46.0 %   MCV 82.6 80.0 - 100.0 fL   MCH 25.7 (L) 26.0 - 34.0 pg   MCHC 31.1 30.0 - 36.0 g/dL   RDW 19.0 (H) 11.5 - 15.5 %   Platelets 147 (L) 150 - 400 K/uL   nRBC 0.0 0.0 - 0.2 %   Neutrophils Relative % 71 %   Neutro Abs 9.1 (H) 1.7 - 7.7 K/uL   Lymphocytes Relative 6 %   Lymphs Abs 0.8 0.7 - 4.0 K/uL   Monocytes Relative 20 %   Monocytes Absolute 2.6 (H) 0.1 - 1.0 K/uL   Eosinophils Relative 0 %   Eosinophils Absolute 0.0 0.0 - 0.5 K/uL   Basophils Relative 0 %    Basophils Absolute 0.0 0.0 - 0.1 K/uL   Immature Granulocytes 3 %   Abs Immature Granulocytes 0.40 (H) 0.00 - 0.07 K/uL    Comment: Performed at  [facility], [place] [address] [place], [place] [address]  Sedimentation rate     Status: None   Collection Time: 06/16/18  7:21 PM  Result Value Ref Range   Sed Rate 15 0 - 22 mm/hr    Comment: Performed at [facility], [place] [address] [place], [place] [address]  Comprehensive metabolic panel     Status: Abnormal   Collection Time: 06/16/18  7:21 PM  Result Value Ref Range   Sodium 142 135 - 145 mmol/L   Potassium 3.9 3.5 - 5.1 mmol/L   Chloride 109 98 - 111 mmol/L   CO2 20 (L) 22 - 32 mmol/L   Glucose, Bld 169 (H) 70 - 99 mg/dL   BUN 8 8 - 23 mg/dL   Creatinine, Ser 1.12 (H) 0.44 - 1.00 mg/dL   Calcium 8.8 (L) 8.9 - 10.3 mg/dL   Total Protein 7.4 6.5 - 8.1 g/dL   Albumin 3.9 3.5 - 5.0 g/dL   AST 41 15 - 41 U/L   ALT 16 0 - 44 U/L   Alkaline Phosphatase 49 38 - 126 U/L   Total Bilirubin 0.6 0.3 - 1.2 mg/dL   GFR calc non Af Amer 49 (L) >60 mL/min   GFR calc Af Amer 57 (L) >60 mL/min    Comment: (NOTE) The eGFR has been calculated using the CKD EPI equation. This calculation has not been validated in all clinical situations. eGFR's persistently <60 mL/min signify possible Chronic Kidney Disease.    Anion gap 13 5 - 15    Comment: Performed at [facility], [place] [address], [place], [place] [address]  Urinalysis, Routine w reflex microscopic     Status: Abnormal   Collection Time: 06/16/18  7:38 PM  Result Value Ref Range   Color, Urine YELLOW YELLOW   APPearance HAZY (A) CLEAR   Specific Gravity, Urine 1.017 1.005 - 1.030   pH 5.0 5.0 - 8.0   Glucose, UA NEGATIVE NEGATIVE mg/dL   Hgb urine dipstick SMALL (A) NEGATIVE   Bilirubin Urine NEGATIVE NEGATIVE   Ketones, ur 20 (A) NEGATIVE mg/dL   Protein, ur 30 (A) NEGATIVE mg/dL   Nitrite NEGATIVE NEGATIVE   Leukocytes, UA  SMALL (A) NEGATIVE   RBC / HPF 0-5 0 - 5 RBC/hpf   WBC, UA 0-5 0 - 5 WBC/hpf   Bacteria, UA FEW (A) NONE SEEN   Squamous Epithelial / LPF 0-5 0 - 5    Comment: Performed at [facility], [name] [address], [place], [place] [address]  I-Stat CG4 Lactic Acid, ED  (not at  [facility])     Status: Abnormal   Collection Time: 06/16/18  7:57 PM  Result Value Ref Range   Lactic Acid, Venous 4.02 (HH) 0.5 - 1.9 mmol/L   Comment NOTIFIED PHYSICIAN   Troponin I     Status: Abnormal   Collection Time: 06/16/18  9:15 PM  Result Value Ref Range   Troponin I 0.03 (HH) <0.03 ng/mL    Comment: CRITICAL RESULT CALLED TO, READ BACK BY AND VERIFIED WITH: OXENSIDENE,J AT 2153 ON 06/16/2018 BY MOSLEY,J Performed at [facility], [place] [address], [place], [place] [address]   I-Stat CG4 Lactic Acid, ED  (not at  [facility])     Status: None   Collection Time: 06/16/18  9:22 PM  Result Value Ref Range   Lactic Acid, Venous 1.08 0.5 - 1.9 mmol/L  Procalcitonin - Baseline  Status: None   Collection Time: 06/16/18 10:14 PM  Result Value Ref Range   Procalcitonin 1.77 ng/mL    Comment:        Interpretation: PCT > 0.5 ng/mL and <= 2 ng/mL: Systemic infection (sepsis) is possible, but other conditions are known to elevate PCT as well. (NOTE)       Sepsis PCT Algorithm           Lower Respiratory Tract                                      Infection PCT Algorithm    ----------------------------     ----------------------------         PCT < 0.25 ng/mL                PCT < 0.10 ng/mL         Strongly encourage             Strongly discourage   discontinuation of antibiotics    initiation of antibiotics    ----------------------------     -----------------------------       PCT 0.25 - 0.50 ng/mL            PCT 0.10 - 0.25 ng/mL               OR       >80% decrease in PCT            Discourage initiation of                                            antibiotics      Encourage  discontinuation           of antibiotics    ----------------------------     -----------------------------         PCT >= 0.50 ng/mL              PCT 0.26 - 0.50 ng/mL                AND       <80% decrease in PCT             Encourage initiation of                                             antibiotics       Encourage continuation           of antibiotics    ----------------------------     -----------------------------        PCT >= 0.50 ng/mL                  PCT > 0.50 ng/mL               AND         increase in PCT                  Strongly encourage                                      initiation  of antibiotics    Strongly encourage escalation           of antibiotics                                     -----------------------------                                           PCT <= 0.25 ng/mL                                                 OR                                        > 80% decrease in PCT                                     Discontinue / Do not initiate                                             antibiotics Performed at [place] [address] [place], [place] [address]   CBC     Status: Abnormal   Collection Time: 06/17/18  1:19 AM  Result Value Ref Range   WBC 12.0 (H) 4.0 - 10.5 K/uL   RBC 4.40 3.87 - 5.11 MIL/uL   Hemoglobin 11.1 (L) 12.0 - 15.0 g/dL   HCT 37.5 36.0 - 46.0 %   MCV 85.2 80.0 - 100.0 fL   MCH 25.2 (L) 26.0 - 34.0 pg   MCHC 29.6 (L) 30.0 - 36.0 g/dL   RDW 19.1 (H) 11.5 - 15.5 %   Platelets 137 (L) 150 - 400 K/uL    Comment: REPEATED TO VERIFY SPECIMEN CHECKED FOR CLOTS    nRBC 0.0 0.0 - 0.2 %    Comment: Performed at [facility], [place] [address], [place], [place] [address]  Creatinine, serum     Status: Abnormal   Collection Time: 06/17/18  4:45 AM  Result Value Ref Range   Creatinine, Ser 1.35 (H) 0.44 - 1.00 mg/dL   GFR calc non Af Amer 39 (L) >60 mL/min   GFR calc Af Amer 45 (L) >60  mL/min    Comment: (NOTE) The eGFR has been calculated using the CKD EPI equation. This calculation has not been validated in all clinical situations. eGFR's persistently <60 mL/min signify possible Chronic Kidney Disease. Performed at [facility], [place] [address], [place], [place] [address]   CBC     Status: Abnormal   Collection Time: 06/17/18  4:45 AM  Result Value Ref Range   WBC 12.9 (H) 4.0 - 10.5 K/uL   RBC 4.06 3.87 - 5.11 MIL/uL   Hemoglobin 10.3 (L) 12.0 - 15.0 g/dL   HCT 35.3 (L) 36.0 - 46.0 %   MCV 86.9 80.0 - 100.0 fL  MCH 25.4 (L) 26.0 - 34.0 pg   MCHC 29.2 (L) 30.0 - 36.0 g/dL   RDW 19.3 (H) 11.5 - 15.5 %   Platelets 109 (L) 150 - 400 K/uL    Comment: REPEATED TO VERIFY PLATELET COUNT CONFIRMED BY SMEAR Immature Platelet Fraction may be clinically indicated, consider ordering this additional test [id number]    nRBC 0.0 0.0 - 0.2 %    Comment: Performed at [facility], [place] [address] [place], [place] [address]  CBC     Status: Abnormal   Collection Time: 06/17/18  9:31 AM  Result Value Ref Range   WBC 10.2 4.0 - 10.5 K/uL   RBC 4.01 3.87 - 5.11 MIL/uL   Hemoglobin 10.3 (L) 12.0 - 15.0 g/dL   HCT 35.0 (L) 36.0 - 46.0 %   MCV 87.3 80.0 - 100.0 fL   MCH 25.7 (L) 26.0 - 34.0 pg   MCHC 29.4 (L) 30.0 - 36.0 g/dL   RDW 19.3 (H) 11.5 - 15.5 %   Platelets 107 (L) 150 - 400 K/uL    Comment: Immature Platelet Fraction may be clinically indicated, consider ordering this additional test [id number] CONSISTENT WITH PREVIOUS RESULT    nRBC 0.0 0.0 - 0.2 %    Comment: Performed at [facility], [place] [address] [place], [place] [address]  MRSA PCR Screening     Status: None   Collection Time: 06/17/18 12:07 PM  Result Value Ref Range   MRSA by PCR NEGATIVE NEGATIVE    Comment:        The GeneXpert MRSA Assay (FDA approved for NASAL specimens only), is one component of a comprehensive MRSA  colonization surveillance program. It is not intended to diagnose MRSA infection nor to guide or monitor treatment for MRSA infections. Performed at [facility], [place] [address], [place], [place] [address]    Ct Abdomen Pelvis Wo Contrast  Result Date: 06/17/2018 CLINICAL DATA:  Initial evaluation for acute trauma, fall. EXAM: CT ABDOMEN AND PELVIS WITHOUT CONTRAST TECHNIQUE: Multidetector CT imaging of the abdomen and pelvis was performed following the standard protocol without IV contrast. COMPARISON:  Prior CT from 01/31/2018. FINDINGS: Lower chest: Small layering right pleural effusion. Bibasilar atelectatic changes. 14 mm densely calcified nodule noted at the left lower lobe. Calcified left hilar adenopathy partially visualized. Findings consistent with prior granulomatous infection. Partially visualized lungs are otherwise clear. Hepatobiliary: Limited noncontrast evaluation of the liver is unremarkable. Gallbladder peers to be absent. No appreciable biliary dilatation. Pancreas: Pancreas within normal limits. Spleen: Scattered calcified granulomas noted within the spleen. Spleen otherwise grossly unremarkable. Adrenals/Urinary Tract: Adrenal glands grossly unremarkable. Kidneys equal in size 3.2 cm cyst present within the interpolar right kidney. No nephrolithiasis or hydronephrosis. No appreciable hydroureter. 4 mm calcific density positioned near the level of the right UPJ favored to reflect a vascular phlebolith. Partially distended bladder within normal limits. Stomach/Bowel: Small hiatal hernia noted. Stomach otherwise unremarkable. No evidence for bowel obstruction. Colonic diverticulosis without evidence for acute diverticulitis. No acute inflammatory changes seen about the bowels. Vascular/Lymphatic: Limited noncontrast evaluation of the intra-abdominal aorta are grossly unremarkable. No appreciable aneurysm. No adenopathy. Reproductive: Uterus is absent.  Ovaries not  discretely identified. Other: No free intraperitoneal air. Ill-defined stranding within the retroperitoneal space, extending along the psoas musculature bilaterally, concerning for retroperitoneal hematoma. Small amount of hemorrhage present within the central pelvis as well. Sequelae of prior ventral hernia repair noted. Musculoskeletal: Acute fracture involving the anterior-inferior end plate  of T10, better evaluated on prior CT of the thoracic spine. No other acute osseous abnormality. Postsurgical changes noted within the thoracolumbar spine. No discrete lytic or blastic osseous lesions. IMPRESSION: 1. Scattered soft tissue stranding within the retroperitoneum and pelvis, consistent with acute hemorrhage, overall small to moderate in volume. No definite visceral injury identified on this noncontrast examination. 2. No other acute traumatic injury within the abdomen and pelvis. 3. Small layering right pleural effusion with associated bibasilar atelectasis. 4. Colonic diverticulosis without evidence for acute diverticulitis. Critical Value/emergent results were called by telephone at the time of interpretation on 06/17/2018 at 12:06 am to Dr. Shela Leff , who verbally acknowledged these results. Electronically Signed   By: Jeannine Boga M.D.   On: 06/17/2018 00:13   Dg Lumbar Spine Complete  Result Date: 06/16/2018 CLINICAL DATA:  Low back pain after fall. EXAM: LUMBAR SPINE - COMPLETE 4+ VIEW COMPARISON:  CT abdomen pelvis dated January 31, 2018. FINDINGS: Five lumbar type vertebral bodies. Prior L3-S1 posterior decompression and L4-L5 interbody fusion. No acute fracture or subluxation. Vertebral body heights are preserved. Unchanged trace retrolisthesis at L2-L3 and trace anterolisthesis at L4-L5. Stable mild disc height loss at L2-L3, L3-L4, and L5-S1. Moderate facet arthropathy at L5-S1 IMPRESSION: 1.  No acute osseous abnormality. 2. Stable lumbar spondylosis and postsurgical changes.  Electronically Signed   By: Titus Dubin M.D.   On: 06/16/2018 15:42   Ct Head Wo Contrast  Result Date: 06/17/2018 CLINICAL DATA:  Encephalopathy. Fell face down yesterday. EXAM: CT HEAD WITHOUT CONTRAST TECHNIQUE: Contiguous axial images were obtained from the base of the skull through the vertex without intravenous contrast. COMPARISON:  None. FINDINGS: Brain: Mild patchy white matter low density in both cerebral hemispheres. Normal size and position of the ventricles. No intracranial hemorrhage, mass lesion or CT evidence of acute infarction. Vascular: No hyperdense vessel or unexpected calcification. Skull: Bilateral hyperostosis frontalis. No skull fractures seen. Sinuses/Orbits: Status post cataract extraction on the left. Unremarkable right orbit and paranasal sinuses. Other: None. IMPRESSION: 1. No acute abnormality. 2. Mild chronic small vessel white matter ischemic changes in both cerebral hemispheres. Electronically Signed   By: Claudie Revering M.D.   On: 06/17/2018 17:02   Ct Thoracic Spine Wo Contrast  Result Date: 06/16/2018 CLINICAL DATA:  Initial evaluation for acute back pain status post fall. EXAM: CT THORACIC AND LUMBAR SPINE WITHOUT CONTRAST TECHNIQUE: Multidetector CT imaging of the thoracic and lumbar spine was performed without contrast. Multiplanar CT image reconstructions were also generated. COMPARISON:  Prior radiograph from earlier the same day as well as earlier studies. FINDINGS: CT THORACIC SPINE FINDINGS Alignment: Examination technically limited by body habitus and motion artifact. Vertebral bodies normally aligned with preservation of the normal thoracic kyphosis. No listhesis or malalignment. Vertebrae: Linear lucency extending through the anterior aspect of the T10 vertebral body consistent with acute minimally displaced fracture (9, image 32). Extension through the adjacent inferior endplate and [id number] interspace without appreciable distraction. No significant  vertebral body height loss. This is also seen on coronal sequence (series 8, image 24). Possible extension to adjacent pole the endplate osteophytes. Vertebral body heights otherwise maintained with no other definite acute fracture identified. 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 Postsurgical changes from prior posterior decompression at T10-11 and T11-12 noted. Paraspinal and other soft tissues: Chronic postsurgical changes present within the lower posterior paraspinous soft tissues. Paraspinous soft tissues demonstrate no acute abnormality. Layering right pleural effusion partially visualized. Atelectatic changes noted  within the lungs bilaterally. Disc levels: T11-12: Large central disc osteophyte complex noted. Prior decompressive laminectomy. Additional multilevel scattered endplate changes noted throughout the mid and lower thoracic spine. Multilevel facet arthropathy noted. No other significant canal stenosis. CT LUMBAR SPINE FINDINGS Segmentation: Examination technically limited by body habitus and motion artifact. Normal segmentation. Lowest well-formed disc labeled the L5-S1 level. Alignment: Mild dextroscoliosis. Trace retrolisthesis of L2 on L3. Alignment otherwise normal with preservation of the normal lumbar lordosis. Vertebrae: Vertebral body heights maintained without evidence for acute or chronic fracture. Postsurgical changes from prior decompressive laminectomy at L3-4 through L5-S1. Interbody device in place at L4-5. Prior hardware removal at L4 and L5. Probable benign hemangiomas involving the L4 and S1 vertebral bodies noted. No other discrete or worrisome osseous lesions. Paraspinal and other soft tissues: Chronic postsurgical changes present within the lower posterior paraspinous soft tissues. Paraspinous soft tissues demonstrate no acute finding. Disc levels: L1-2: Minimal annular disc bulge. Moderate bilateral facet hypertrophy. No significant  stenosis. L2-3: Trace retrolisthesis. Diffuse disc bulge with intervertebral disc space narrowing. Advanced bilateral facet hypertrophy. Resultant moderate canal with moderate to severe bilateral L2 foraminal stenosis. L3-4: Mild disc bulge. Prior decompressive laminectomy. Moderate to advanced bilateral facet hypertrophy. No significant spinal stenosis. Severe left with mild to moderate right L3 foraminal narrowing. L4-5: Prior discectomy with interbody fusion, with posterior decompression. Residual bilateral facet arthrosis. No residual canal stenosis. Mild to moderate bilateral L4 foraminal narrowing. L5-S1: Diffuse disc bulge. Prior posterior decompression. Advanced bilateral facet arthrosis. No significant residual spinal stenosis. Severe bilateral L5 foraminal narrowing. IMPRESSION: CT THORACIC SPINE IMPRESSION 1. Acute minimally displaced fracture extending through the anterior-inferior aspect of the T10 vertebral body as above. No associated listhesis or malalignment. 2. No other acute traumatic injury identified within the thoracic spine. 3. Large central disc osteophyte complex at T11-12 with sequelae of prior decompressive laminectomy at T10-11 and T11-12. 4. Layering right pleural effusion with bibasilar atelectatic changes. CT LUMBAR SPINE IMPRESSION 1. No acute traumatic injury within the lumbar spine. 2. Postsurgical changes from prior posterior decompression at L3-4 through L5-S1, with interbody fusion at L4-5. 3. 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 Resultant moderate spinal stenosis at L2-3, with moderate to advanced multilevel foraminal narrowing, most notable at L5-S1 bilaterally. Electronically Signed   By: Jeannine Boga M.D.   On: 06/16/2018 23:48   Ct Lumbar Spine Wo Contrast  Result Date: 06/16/2018 CLINICAL DATA:  Initial evaluation for acute back pain status post fall. EXAM: CT THORACIC AND LUMBAR SPINE WITHOUT CONTRAST TECHNIQUE:  Multidetector CT imaging of the thoracic and lumbar spine was performed without contrast. Multiplanar CT image reconstructions were also generated. COMPARISON:  Prior radiograph from earlier the same day as well as earlier studies. FINDINGS: CT THORACIC SPINE FINDINGS Alignment: Examination technically limited by body habitus and motion artifact. Vertebral bodies normally aligned with preservation of the normal thoracic kyphosis. No listhesis or malalignment. Vertebrae: Linear lucency extending through the anterior aspect of the T10 vertebral body consistent with acute minimally displaced fracture (9, image 32). Extension through the adjacent inferior endplate and [id number] interspace without appreciable distraction. No significant vertebral body height loss. This is also seen on coronal sequence (series 8, image 24). Possible extension to adjacent pole the endplate osteophytes. Vertebral body heights otherwise maintained with no other definite acute fracture identified. 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 Postsurgical changes from prior posterior decompression at T10-11 and T11-12 noted. Paraspinal and other soft tissues: Chronic  postsurgical changes present within the lower posterior paraspinous soft tissues. Paraspinous soft tissues demonstrate no acute abnormality. Layering right pleural effusion partially visualized. Atelectatic changes noted within the lungs bilaterally. Disc levels: T11-12: Large central disc osteophyte complex noted. Prior decompressive laminectomy. Additional multilevel scattered endplate changes noted throughout the mid and lower thoracic spine. Multilevel facet arthropathy noted. No other significant canal stenosis. CT LUMBAR SPINE FINDINGS Segmentation: Examination technically limited by body habitus and motion artifact. Normal segmentation. Lowest well-formed disc labeled the L5-S1 level. Alignment: Mild dextroscoliosis. Trace retrolisthesis  of L2 on L3. Alignment otherwise normal with preservation of the normal lumbar lordosis. Vertebrae: Vertebral body heights maintained without evidence for acute or chronic fracture. Postsurgical changes from prior decompressive laminectomy at L3-4 through L5-S1. Interbody device in place at L4-5. Prior hardware removal at L4 and L5. Probable benign hemangiomas involving the L4 and S1 vertebral bodies noted. No other discrete or worrisome osseous lesions. Paraspinal and other soft tissues: Chronic postsurgical changes present within the lower posterior paraspinous soft tissues. Paraspinous soft tissues demonstrate no acute finding. Disc levels: L1-2: Minimal annular disc bulge. Moderate bilateral facet hypertrophy. No significant stenosis. L2-3: Trace retrolisthesis. Diffuse disc bulge with intervertebral disc space narrowing. Advanced bilateral facet hypertrophy. Resultant moderate canal with moderate to severe bilateral L2 foraminal stenosis. L3-4: Mild disc bulge. Prior decompressive laminectomy. Moderate to advanced bilateral facet hypertrophy. No significant spinal stenosis. Severe left with mild to moderate right L3 foraminal narrowing. L4-5: Prior discectomy with interbody fusion, with posterior decompression. Residual bilateral facet arthrosis. No residual canal stenosis. Mild to moderate bilateral L4 foraminal narrowing. L5-S1: Diffuse disc bulge. Prior posterior decompression. Advanced bilateral facet arthrosis. No significant residual spinal stenosis. Severe bilateral L5 foraminal narrowing. IMPRESSION: CT THORACIC SPINE IMPRESSION 1. Acute minimally displaced fracture extending through the anterior-inferior aspect of the T10 vertebral body as above. No associated listhesis or malalignment. 2. No other acute traumatic injury identified within the thoracic spine. 3. Large central disc osteophyte complex at T11-12 with sequelae of prior decompressive laminectomy at T10-11 and T11-12. 4. Layering right  pleural effusion with bibasilar atelectatic changes. CT LUMBAR SPINE IMPRESSION 1. No acute traumatic injury within the lumbar spine. 2. Postsurgical changes from prior posterior decompression at L3-4 through L5-S1, with interbody fusion at L4-5. 3. 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 Resultant moderate spinal stenosis at L2-3, with moderate to advanced multilevel foraminal narrowing, most notable at L5-S1 bilaterally. Electronically Signed   By: Jeannine Boga M.D.   On: 06/16/2018 23:48   Ct Hip Left Wo Contrast  Result Date: 06/16/2018 CLINICAL DATA:  Left hip pain. EXAM: CT OF THE LEFT HIP WITHOUT CONTRAST TECHNIQUE: Multidetector CT imaging of the left hip was performed according to the standard protocol. Multiplanar CT image reconstructions were also generated. COMPARISON:  None. FINDINGS: Bones/Joint/Cartilage No fracture or dislocation. Normal alignment. No joint effusion. Mild left hip joint space narrowing with tiny marginal osteophytes. Mild osteoarthritis of bilateral sacroiliac. No aggressive osseous lesion. No periosteal reaction or bone destruction. Ligaments Ligaments are suboptimally evaluated by CT. Muscles and Tendons Muscles are normal. No muscle atrophy. No intramuscular fluid collection or hematoma. Soft tissue No fluid collection or hematoma.  No soft tissue mass. IMPRESSION: 1.  No acute osseous injury of the left hip. 2. 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 Electronically Signed   By: Kathreen Devoid   On: 06/16/2018 17:10   Dg Chest Port 1 View  Result Date: 06/16/2018 CLINICAL DATA:  Sepsis. EXAM:  PORTABLE CHEST 1 VIEW COMPARISON:  05/18/2017 FINDINGS: Lungs are adequately inflated with mild hazy perihilar prominence suggesting mild vascular congestion. No lobar consolidation or effusion. Borderline stable cardiomegaly. Remainder of the exam is unchanged. IMPRESSION: Borderline stable cardiomegaly with suggestion of mild vascular  congestion. Electronically Signed   By: Marin Olp M.D.   On: 06/16/2018 22:49   Dg Humerus Left  Result Date: 06/16/2018 CLINICAL DATA:  Left shoulder pain after fall. EXAM: LEFT HUMERUS - 2+ VIEW COMPARISON:  None. FINDINGS: Acute fracture of the proximal humerus involving the surgical neck and greater tuberosity. No dislocation. Moderate acromioclavicular joint space narrowing with bony hypertrophy. Soft tissues are unremarkable. IMPRESSION: Acute fracture of the proximal humerus surgical neck and greater tuberosity. Electronically Signed   By: Titus Dubin M.D.   On: 06/16/2018 15:30   Dg Hip Unilat With Pelvis 2-3 Views Left  Result Date: 06/16/2018 CLINICAL DATA:  Left hip pain after fall. EXAM: DG HIP (WITH OR WITHOUT PELVIS) 2-3V LEFT COMPARISON:  Pelvis and right hip x-rays dated February 12, 2018. CT abdomen pelvis dated July 25, 2009. FINDINGS: No acute fracture or dislocation. The pubic symphysis and sacroiliac joints are intact. The hip joint spaces are relatively preserved. 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 Partially visualized cortical thickening of the right proximal femoral diaphysis with heterotopic ossification, grossly unchanged dating back to prior CTs from 2010 and likely posttraumatic in etiology. Degenerative changes of the lower lumbar spine. IMPRESSION: 1.  No acute osseous abnormality. Electronically Signed   By: Titus Dubin M.D.   On: 06/16/2018 15:40   EKG Interpretation  Date/Time:  Tuesday June 16 2018 21:08:07 EDT Ventricular Rate:  127 PR Interval:    QRS Duration: 65 QT Interval:  319 QTC Calculation: 464 R Axis:   36 Text Interpretation:  Sinus tachycardia Low voltage, precordial leads Borderline T abnormalities, diffuse leads Since last tracing of earlier today No significant change was found Confirmed by Daleen Bo [phone]) on 06/17/2018 9:53:52 AM   Pending Labs Unresulted Labs (From  admission, onward)    Start     Ordered   06/18/18 0500  CBC with Differential/Platelet  Tomorrow morning,   R     06/17/18 1221   06/18/18 0500  Comprehensive metabolic panel  Tomorrow morning,   R     06/17/18 1221   06/18/18 0500  Magnesium  Tomorrow morning,   R     06/17/18 1221   06/18/18 0500  Lipase, blood  Tomorrow morning,   R     06/17/18 1221   06/17/18 1210  Culture, Urine  Once,   R     06/17/18 1209   06/17/18 0500  Creatinine, serum  Every 48 hours,   R     06/16/18 1958   06/16/18 2214  HIV antibody (Routine Testing)  Once,   R     06/16/18 2222   06/16/18 1857  Blood Culture (routine x 2)  BLOOD CULTURE X 2,   STAT     06/16/18 1902          Vitals/Pain Today's Vitals   06/17/18 1649 06/17/18 1704 06/17/18 1732 06/17/18 1733  BP:   (!) 134/113   Pulse:   67   Resp:   12   Temp:      TempSrc:      SpO2:   100%   Weight:      Height:      PainSc: 2  8   4     Isolation Precautions No active isolations  Medications Medications  ceFEPIme (MAXIPIME) 2 g in sodium chloride 0.9 % 100 mL IVPB (0 g Intravenous Stopped 06/17/18 1130)  vancomycin (VANCOCIN) 1,500 mg in sodium chloride 0.9 % 500 mL IVPB (0 mg Intravenous Stopped 06/16/18 2315)    Followed by  vancomycin (VANCOCIN) IVPB 750 mg/150 ml premix (0 mg Intravenous Stopped 06/17/18 1506)  oxyCODONE-acetaminophen (PERCOCET/ROXICET) 5-325 MG per tablet 1-2 tablet (1 tablet Oral Given 06/17/18 1456)  metoprolol tartrate (LOPRESSOR) tablet 25 mg (25 mg Oral Given 06/17/18 1057)  simvastatin (ZOCOR) tablet 20 mg (20 mg Oral Given 06/17/18 0108)  citalopram (CELEXA) tablet 40 mg (40 mg Oral Given 06/17/18 1057)  loperamide (IMODIUM) capsule 2 mg (has no administration in time range)  pantoprazole (PROTONIX) EC tablet 40 mg (40 mg Oral Given 06/17/18 1057)  gabapentin (NEURONTIN) capsule 300 mg (300 mg Oral Given 06/17/18 0109)  albuterol (PROVENTIL) (2.5 MG/3ML) 0.083% nebulizer solution 2.5 mg (has no  administration in time range)  morphine 2 MG/ML injection 1 mg (1 mg Intravenous Given 06/17/18 1705)  promethazine (PHENERGAN) injection 12.5 mg (12.5 mg Intravenous Given 06/17/18 0446)  ondansetron (ZOFRAN) injection 4 mg (has no administration in time range)  cloNIDine (CATAPRES) tablet 0.2 mg (has no administration in time range)  ondansetron (ZOFRAN) injection 4 mg (4 mg Intravenous Given 06/16/18 1658)  morphine 4 MG/ML injection 6 mg (6 mg Intramuscular Given 06/16/18 1409)  morphine 4 MG/ML injection 6 mg (6 mg Intravenous Given 06/16/18 1735)  HYDROmorphone (DILAUDID) injection 1 mg (1 mg Intravenous Given 06/16/18 1839)  ondansetron (ZOFRAN) injection 4 mg (4 mg Intravenous Given 06/16/18 1839)  sodium chloride 0.9 % bolus 1,000 mL (0 mLs Intravenous Stopped 06/16/18 2112)    And  sodium chloride 0.9 % bolus 1,000 mL (0 mLs Intravenous Stopped 06/16/18 2114)    And  sodium chloride 0.9 % bolus 1,000 mL (0 mLs Intravenous Stopped 06/16/18 2144)  ceFEPIme (MAXIPIME) 2 g in sodium chloride 0.9 % 100 mL IVPB (0 g Intravenous Stopped 06/16/18 2057)  HYDROmorphone (DILAUDID) injection 1 mg (1 mg Intravenous Given 06/16/18 2059)    Mobility non-ambulatory

## 2018-06-18 ENCOUNTER — Inpatient Hospital Stay (HOSPITAL_COMMUNITY): Payer: Medicare Other

## 2018-06-18 DIAGNOSIS — K661 Hemoperitoneum: Secondary | ICD-10-CM | POA: Diagnosis not present

## 2018-06-18 DIAGNOSIS — G9341 Metabolic encephalopathy: Secondary | ICD-10-CM | POA: Diagnosis not present

## 2018-06-18 DIAGNOSIS — M549 Dorsalgia, unspecified: Secondary | ICD-10-CM | POA: Diagnosis not present

## 2018-06-18 DIAGNOSIS — N179 Acute kidney failure, unspecified: Secondary | ICD-10-CM

## 2018-06-18 DIAGNOSIS — R509 Fever, unspecified: Secondary | ICD-10-CM | POA: Diagnosis not present

## 2018-06-18 DIAGNOSIS — Z888 Allergy status to other drugs, medicaments and biological substances status: Secondary | ICD-10-CM

## 2018-06-18 DIAGNOSIS — Z91012 Allergy to eggs: Secondary | ICD-10-CM

## 2018-06-18 DIAGNOSIS — Z91013 Allergy to seafood: Secondary | ICD-10-CM

## 2018-06-18 DIAGNOSIS — Z91041 Radiographic dye allergy status: Secondary | ICD-10-CM | POA: Diagnosis not present

## 2018-06-18 DIAGNOSIS — Z882 Allergy status to sulfonamides status: Secondary | ICD-10-CM | POA: Diagnosis not present

## 2018-06-18 DIAGNOSIS — M25512 Pain in left shoulder: Secondary | ICD-10-CM | POA: Diagnosis not present

## 2018-06-18 DIAGNOSIS — Z91048 Other nonmedicinal substance allergy status: Secondary | ICD-10-CM

## 2018-06-18 DIAGNOSIS — Z886 Allergy status to analgesic agent status: Secondary | ICD-10-CM | POA: Diagnosis not present

## 2018-06-18 DIAGNOSIS — I5032 Chronic diastolic (congestive) heart failure: Secondary | ICD-10-CM | POA: Diagnosis not present

## 2018-06-18 DIAGNOSIS — I959 Hypotension, unspecified: Secondary | ICD-10-CM | POA: Diagnosis not present

## 2018-06-18 DIAGNOSIS — S22078A Other fracture of T9-T10 vertebra, initial encounter for closed fracture: Secondary | ICD-10-CM | POA: Diagnosis not present

## 2018-06-18 DIAGNOSIS — I5042 Chronic combined systolic (congestive) and diastolic (congestive) heart failure: Secondary | ICD-10-CM | POA: Diagnosis not present

## 2018-06-18 DIAGNOSIS — R58 Hemorrhage, not elsewhere classified: Secondary | ICD-10-CM

## 2018-06-18 DIAGNOSIS — M6282 Rhabdomyolysis: Secondary | ICD-10-CM | POA: Diagnosis not present

## 2018-06-18 DIAGNOSIS — S42212A Unspecified displaced fracture of surgical neck of left humerus, initial encounter for closed fracture: Secondary | ICD-10-CM

## 2018-06-18 DIAGNOSIS — A419 Sepsis, unspecified organism: Secondary | ICD-10-CM | POA: Diagnosis not present

## 2018-06-18 DIAGNOSIS — M79604 Pain in right leg: Secondary | ICD-10-CM

## 2018-06-18 DIAGNOSIS — Z881 Allergy status to other antibiotic agents status: Secondary | ICD-10-CM

## 2018-06-18 DIAGNOSIS — Z885 Allergy status to narcotic agent status: Secondary | ICD-10-CM

## 2018-06-18 DIAGNOSIS — N281 Cyst of kidney, acquired: Secondary | ICD-10-CM | POA: Diagnosis not present

## 2018-06-18 DIAGNOSIS — Z91018 Allergy to other foods: Secondary | ICD-10-CM

## 2018-06-18 DIAGNOSIS — D649 Anemia, unspecified: Secondary | ICD-10-CM | POA: Diagnosis not present

## 2018-06-18 DIAGNOSIS — I130 Hypertensive heart and chronic kidney disease with heart failure and stage 1 through stage 4 chronic kidney disease, or unspecified chronic kidney disease: Secondary | ICD-10-CM | POA: Diagnosis not present

## 2018-06-18 LAB — MAGNESIUM: MAGNESIUM: 1.5 mg/dL — AB (ref 1.7–2.4)

## 2018-06-18 LAB — CBC WITH DIFFERENTIAL/PLATELET
ABS IMMATURE GRANULOCYTES: 0.09 10*3/uL — AB (ref 0.00–0.07)
BASOS PCT: 0 %
Basophils Absolute: 0 10*3/uL (ref 0.0–0.1)
EOS PCT: 1 %
Eosinophils Absolute: 0.1 10*3/uL (ref 0.0–0.5)
HCT: 32.3 % — ABNORMAL LOW (ref 36.0–46.0)
Hemoglobin: 9.5 g/dL — ABNORMAL LOW (ref 12.0–15.0)
Immature Granulocytes: 1 %
Lymphocytes Relative: 15 %
Lymphs Abs: 1.4 10*3/uL (ref 0.7–4.0)
MCH: 25.8 pg — AB (ref 26.0–34.0)
MCHC: 29.4 g/dL — ABNORMAL LOW (ref 30.0–36.0)
MCV: 87.8 fL (ref 80.0–100.0)
MONOS PCT: 33 %
Monocytes Absolute: 3.1 10*3/uL — ABNORMAL HIGH (ref 0.1–1.0)
NEUTROS ABS: 4.9 10*3/uL (ref 1.7–7.7)
Neutrophils Relative %: 50 %
PLATELETS: 89 10*3/uL — AB (ref 150–400)
RBC: 3.68 MIL/uL — ABNORMAL LOW (ref 3.87–5.11)
RDW: 19.2 % — ABNORMAL HIGH (ref 11.5–15.5)
WBC: 9.6 10*3/uL (ref 4.0–10.5)
nRBC: 0 % (ref 0.0–0.2)

## 2018-06-18 LAB — COMPREHENSIVE METABOLIC PANEL
ALK PHOS: 41 U/L (ref 38–126)
ALT: 14 U/L (ref 0–44)
AST: 28 U/L (ref 15–41)
Albumin: 3.3 g/dL — ABNORMAL LOW (ref 3.5–5.0)
Anion gap: 9 (ref 5–15)
BUN: 19 mg/dL (ref 8–23)
CALCIUM: 7.5 mg/dL — AB (ref 8.9–10.3)
CO2: 21 mmol/L — ABNORMAL LOW (ref 22–32)
CREATININE: 3.29 mg/dL — AB (ref 0.44–1.00)
Chloride: 107 mmol/L (ref 98–111)
GFR calc non Af Amer: 13 mL/min — ABNORMAL LOW (ref >60)
GFR, EST AFRICAN AMERICAN: 15 mL/min — AB (ref >60)
Glucose, Bld: 130 mg/dL — ABNORMAL HIGH (ref 70–99)
Potassium: 4.4 mmol/L (ref 3.5–5.1)
Sodium: 137 mmol/L (ref 135–145)
Total Bilirubin: 0.9 mg/dL (ref 0.3–1.2)
Total Protein: 6.6 g/dL (ref 6.5–8.1)

## 2018-06-18 LAB — VANCOMYCIN, RANDOM: VANCOMYCIN RM: 14

## 2018-06-18 LAB — CK: CK TOTAL: 868 U/L — AB (ref 38–234)

## 2018-06-18 LAB — LIPASE, BLOOD: LIPASE: 35 U/L (ref 11–51)

## 2018-06-18 LAB — HIV ANTIBODY (ROUTINE TESTING W REFLEX): HIV SCREEN 4TH GENERATION: NONREACTIVE

## 2018-06-18 MED ORDER — SODIUM CHLORIDE 0.9 % IV SOLN
600.0000 mg | INTRAVENOUS | Status: DC
  Administered 2018-06-18: 600 mg via INTRAVENOUS
  Filled 2018-06-18 (×2): qty 12

## 2018-06-18 MED ORDER — SODIUM CHLORIDE 0.9 % IV SOLN
2.0000 g | INTRAVENOUS | Status: DC
  Administered 2018-06-18 – 2018-06-19 (×2): 2 g via INTRAVENOUS
  Filled 2018-06-18 (×4): qty 2

## 2018-06-18 MED ORDER — SODIUM CHLORIDE 0.9 % IV SOLN
INTRAVENOUS | Status: DC | PRN
  Administered 2018-06-18 – 2018-06-22 (×2): 250 mL via INTRAVENOUS

## 2018-06-18 MED ORDER — SENNOSIDES-DOCUSATE SODIUM 8.6-50 MG PO TABS
1.0000 | ORAL_TABLET | Freq: Two times a day (BID) | ORAL | Status: DC
  Administered 2018-06-18 – 2018-06-23 (×10): 1 via ORAL
  Filled 2018-06-18 (×10): qty 1

## 2018-06-18 MED ORDER — LIP MEDEX EX OINT
TOPICAL_OINTMENT | CUTANEOUS | Status: AC
  Administered 2018-06-18: 17:00:00
  Filled 2018-06-18: qty 7

## 2018-06-18 NOTE — Progress Notes (Signed)
PROGRESS NOTE  [name] IRW:[id number] DOB: 1951/05/17 DOA: 06/16/2018 PCP: [name], MD  Brief history:  Reports lives by herself, she reports tripped adn [place] facing down at home and lay on the floor for more than an hrs  Daughter reports patient has progressive weakness in the last few weeks She has fever of unclear source     HPI/Recap of past 24 hours:  Appear more alert, but still slow in answering questions, mild clonus  tmax  100.6 last night Cr increased from 1.2 to 3.29, urine output not documented, no edema on exam, denies sob Bladder scan 311cc, foley inserted  Assessment/Plan: Principal Problem:   Sepsis ([place]) Active Problems:   Chronic congestive heart failure (HCC)   Hypertension   Hyperlipidemia   Nausea vomiting and diarrhea   Back pain   Humerus fracture   Thrombocytopenia (HCC)   CKD (chronic kidney disease) stage 3, GFR 30-59 ml/min (HCC)   Depression   Physical deconditioning   Sepsis? -Fever 100.8,sinus tachycardia, heart rate 110-120's on presentation, wbc12.9, lactic acid 6.14 on presentation -UA with few bacteria, she does report abdominal pain, blood culture in process, MRSA screening negative, CT spine imaging there is no concern of infection there -cxr with "Borderline stable cardiomegaly with suggestion of mild vascular congestion" -Patient reported having diarrhea at home, but no BM  In the ED since arrived to the hospital -Infectious disease consulted   AKI on CKDII -cr baseline 0.9 -cr jumped to 3.29 today -ct on presentation on obstruction,  -ua with few bacteria -bladder scan today with 311cc urine, insert foley --worsening of cr likely multifactorial, she does has mild elevated ck, she received vanc on admission, -nephrology consulted  acute minimally displaced fracture extending through the anterior-inferior aspect of the T10 vertebral body Admitting MD Dr Marlowe Sax discussed with neurosurgery PA on-call (Mr.  Cato Mulligan) consulted who recommended "no surgical intervention is needed at this time.  Recommendation is to use TLSO brace and outpatient neurosurgery follow-up."  Minimally displaced L proximal humerus fracture  ortho Dr Griffin Basil consulted Per Dr Griffin Basil" NWB in sling x6 weeks, followup outpatient when discharged.  No PT or ROM of shoulder.  Sling at all times. OK to move hand and wrist while in sling. "  Acute retroperitoneum hemorrhage: Scattered soft tissue stranding within the retroperitoneum and pelvis, consistent with acute hemorrhage, overall small to moderate in volume.  Admitting MD Dr Marlowe Sax discussed with general surgery on call Dr Excell Seltzer who states "no surgical intervention is needed at this time." -Monitor blood pressure and hemoglobin  Encephalopathy -She is drowsy slightly confused which is not her baseline -Stat CT scan no acute findings -Daughter think this is might be related to pain medication that she received at 5 AM -Taper clonidine -seems improving  Nausea and vomiting, chronic diarrhea Non observed since admission  ct ab no obstruction   History of combined CHF Last EF in 2015 was 50% with diffuse hyper hypokinesis, and grade 1 diastolic dysfunction -Currently no edema, will hold Lasix for now due to drowsiness and poor oral intake -Close monitor volume status  Hypertension  continue Lopressor,  Blood pressure fluctuating , taper clonidine  hold home meds losartan HCTZ for now, hold Lasix for now  Body mass index is 37.2 kg/m.   FTT: Per daughter patient has been mostly in bed for 57-month due to multiple back issues.  She does walk to the car and go to church on the weekend Will need PT OT eval  Patient adamantly does not want to go to SNF   Code Status: full  Family Communication: patient daily and daughter at bedside on 10/30  Disposition Plan: remain in stepdown   Consultants:  ID  Procedures:  none  Antibiotics:  Vancx1 on 10/29  and x1 on 10/30 Cefepime from admission to current daptomycin from admission to current  Objective: BP (!) 114/41   Pulse 80   Temp 99.4 F (37.4 C) (Oral)   Resp (!) 28   Ht 5\' 3"  (1.6 m)   Wt 95.3 kg   SpO2 96%   BMI 37.20 kg/m   Intake/Output Summary (Last 24 hours) at 06/18/2018 0934 Last data filed at 06/18/2018 0800 Gross per 24 hour  Intake 200 ml  Output -  Net 200 ml   Filed Weights   06/16/18 1308  Weight: 95.3 kg    Exam: Patient is examined daily including today on 06/18/2018, exams remain the same as of yesterday except that has changed    General:  Less Drowsy, less confused, slow in answer questions  Cardiovascular: RRR  Respiratory: CTABL  Abdomen: Soft/ND/NT, positive BS  Musculoskeletal: No Edema, not able to lift left leg against gravity  Neuro: less drowsy, oriented x3, slightly slow in answer questions  Data Reviewed: Basic Metabolic Panel: Recent Labs  Lab 06/16/18 1848 06/16/18 1921 06/17/18 0445 06/18/18 0320  NA 141 142  --  137  K 3.6 3.9  --  4.4  CL 106 109  --  107  CO2  --  20*  --  21*  GLUCOSE 174* 169*  --  130*  BUN 5* 8  --  19  CREATININE 0.90 1.12* 1.35* 3.29*  CALCIUM  --  8.8*  --  7.5*  MG  --   --   --  1.5*   Liver Function Tests: Recent Labs  Lab 06/16/18 1921 06/18/18 0320  AST 41 28  ALT 16 14  ALKPHOS 49 41  BILITOT 0.6 0.9  PROT 7.4 6.6  ALBUMIN 3.9 3.3*   Recent Labs  Lab 06/18/18 0320  LIPASE 35   No results for input(s): AMMONIA in the last 168 hours. CBC: Recent Labs  Lab 06/16/18 1915 06/17/18 0119 06/17/18 0445 06/17/18 0931 06/18/18 0320  WBC 12.9* 12.0* 12.9* 10.2 9.6  NEUTROABS 9.1*  --   --   --  4.9  HGB 12.7 11.1* 10.3* 10.3* 9.5*  HCT 40.9 37.5 35.3* 35.0* 32.3*  MCV 82.6 85.2 86.9 87.3 87.8  PLT 147* 137* 109* 107* 89*   Cardiac Enzymes:   Recent Labs  Lab 06/16/18 2115 06/18/18 0320  CKTOTAL  --  868*  TROPONINI 0.03*  --    BNP (last 3 results) No  results for input(s): BNP in the last 8760 hours.  ProBNP (last 3 results) No results for input(s): PROBNP in the last 8760 hours.  CBG: No results for input(s): GLUCAP in the last 168 hours.  Recent Results (from the past 240 hour(s))  MRSA PCR Screening     Status: None   Collection Time: 06/17/18 12:07 PM  Result Value Ref Range Status   MRSA by PCR NEGATIVE NEGATIVE Final    Comment:        The GeneXpert MRSA Assay (FDA approved for NASAL specimens only), is one component of a comprehensive MRSA colonization surveillance program. It is not intended to diagnose MRSA infection nor to guide or monitor treatment for MRSA infections. Performed at [facility], [address] [place], [place] [address]      Studies: Ct Head Wo Contrast  Result Date: 06/17/2018 CLINICAL DATA:  Encephalopathy. Fell face down yesterday. EXAM: CT HEAD WITHOUT CONTRAST TECHNIQUE: Contiguous axial images were obtained from the base of the skull through the vertex without intravenous contrast. COMPARISON:  None. FINDINGS: Brain: Mild patchy white matter low density in both cerebral hemispheres. Normal size and position of the ventricles. No intracranial hemorrhage, mass lesion or CT evidence of acute infarction. Vascular: No hyperdense vessel or unexpected calcification. Skull: Bilateral hyperostosis frontalis. No skull fractures seen. Sinuses/Orbits: Status post cataract extraction on the left. Unremarkable right orbit and paranasal sinuses. Other: None. IMPRESSION: 1. No acute abnormality. 2. Mild chronic small vessel white matter ischemic changes in both cerebral hemispheres. Electronically Signed   By: Claudie Revering M.D.   On: 06/17/2018 17:02    Scheduled Meds: . citalopram  40 mg Oral Daily  . cloNIDine  0.2 mg Oral BID  . gabapentin  300 mg Oral QHS  . metoprolol tartrate  25 mg Oral Daily  . pantoprazole  40 mg Oral Daily  . simvastatin  20 mg Oral QPM    Continuous  Infusions: . ceFEPime (MAXIPIME) IV    . DAPTOmycin (CUBICIN)  IV       Time spent: 77mins, case discussed with nephrology I have personally reviewed and interpreted on  06/18/2018 daily labs, tele strips, imagings as discussed above under date review session and assessment and plans.  I reviewed all nursing notes, pharmacy notes, consultant notes,  vitals, pertinent old records  I have discussed plan of care as described above with RN , patient  on 06/18/2018   Florencia Reasons MD, PhD  [facility] Pager [phone]. If 7PM-7AM, please contact night-coverage at [email or web address], password [facility] 06/18/2018, 9:34 AM  LOS: 2 days

## 2018-06-18 NOTE — Consult Note (Signed)
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 lumbar lordosis. Vertebrae: Vertebral body heights maintained without evidence for acute or chronic fracture. Postsurgical changes from prior decompressive laminectomy at L3-4 through L5-S1. Interbody device in place at L4-5. Prior hardware removal at L4 and L5. Probable benign hemangiomas involving the L4 and S1 vertebral bodies noted. No other discrete or worrisome osseous lesions. Paraspinal and other soft tissues: Chronic postsurgical changes present within the  lower posterior paraspinous soft tissues. Paraspinous soft tissues demonstrate no acute finding. Disc levels: L1-2: Minimal annular disc bulge. Moderate bilateral facet hypertrophy. No significant stenosis. L2-3: Trace retrolisthesis. Diffuse disc bulge with intervertebral disc space narrowing. Advanced bilateral  facet hypertrophy. Resultant moderate canal with moderate to severe bilateral L2 foraminal stenosis. L3-4: Mild disc bulge. Prior decompressive laminectomy. Moderate to advanced bilateral facet hypertrophy. No significant spinal stenosis. Severe left with mild to moderate right L3 foraminal narrowing. L4-5: Prior discectomy with interbody fusion, with posterior decompression. Residual bilateral facet arthrosis. No residual canal stenosis. Mild to moderate bilateral L4 foraminal narrowing. L5-S1: Diffuse disc bulge. Prior posterior decompression. Advanced bilateral facet arthrosis. No significant residual spinal stenosis. Severe bilateral L5 foraminal narrowing. IMPRESSION: CT THORACIC SPINE IMPRESSION 1. Acute minimally displaced fracture extending through the anterior-inferior aspect of the T10 vertebral body as above. No associated listhesis or malalignment. 2. No other acute traumatic injury identified within the thoracic spine. 3. Large central disc osteophyte complex at T11-12 with sequelae of prior decompressive laminectomy at T10-11 and T11-12. 4. Layering right pleural effusion with bibasilar atelectatic changes. CT LUMBAR SPINE IMPRESSION 1. No acute traumatic injury within the lumbar spine. 2. Postsurgical changes from prior posterior decompression at L3-4 through L5-S1, with interbody fusion at L4-5. 3. 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 Resultant moderate spinal stenosis at L2-3, with moderate to advanced multilevel foraminal narrowing, most notable at L5-S1 bilaterally. Electronically Signed   By: Jeannine Boga M.D.   On: 06/16/2018 23:48   Ct Lumbar Spine Wo Contrast  Result Date: 06/16/2018 CLINICAL DATA:  Initial evaluation for acute back pain status post fall. EXAM: CT THORACIC AND LUMBAR SPINE WITHOUT CONTRAST TECHNIQUE: Multidetector CT imaging of the thoracic and lumbar spine was performed without contrast. Multiplanar CT image reconstructions  were also generated. COMPARISON:  Prior radiograph from earlier the same day as well as earlier studies. FINDINGS: CT THORACIC SPINE FINDINGS Alignment: Examination technically limited by body habitus and motion artifact. Vertebral bodies normally aligned with preservation of the normal thoracic kyphosis. No listhesis or malalignment. Vertebrae: Linear lucency extending through the anterior aspect of the T10 vertebral body consistent with acute minimally displaced fracture (9, image 32). Extension through the adjacent inferior endplate and [id number] interspace without appreciable distraction. No significant vertebral body height loss. This is also seen on coronal sequence (series 8, image 24). Possible extension to adjacent pole the endplate osteophytes. Vertebral body heights otherwise maintained with no other definite acute fracture identified. 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 Postsurgical changes from prior posterior decompression at T10-11 and T11-12 noted. Paraspinal and other soft tissues: Chronic postsurgical changes present within the lower posterior paraspinous soft tissues. Paraspinous soft tissues demonstrate no acute abnormality. Layering right pleural effusion partially visualized. Atelectatic changes noted within the lungs bilaterally. Disc levels: T11-12: Large central disc osteophyte complex noted. Prior decompressive laminectomy. Additional multilevel scattered endplate changes noted throughout the mid and lower thoracic spine. Multilevel facet arthropathy noted. No other significant canal stenosis. CT LUMBAR SPINE FINDINGS Segmentation: Examination technically limited by body habitus and motion artifact. Normal segmentation. Lowest well-formed disc labeled the L5-S1 level. Alignment: Mild dextroscoliosis. Trace retrolisthesis of L2 on L3. Alignment otherwise normal with preservation of the normal lumbar lordosis. Vertebrae: Vertebral body heights  maintained without evidence for acute or chronic fracture. Postsurgical changes from prior decompressive laminectomy at L3-4 through L5-S1. Interbody device in place at L4-5. Prior hardware removal at L4 and L5. Probable benign  hemangiomas involving the L4 and S1 vertebral bodies noted. No other discrete or worrisome osseous lesions. Paraspinal and other soft tissues: Chronic postsurgical changes present within the lower posterior paraspinous soft tissues. Paraspinous soft tissues demonstrate no acute finding. Disc levels: L1-2: Minimal annular disc bulge. Moderate bilateral facet hypertrophy. No significant stenosis. L2-3: Trace retrolisthesis. Diffuse disc bulge with intervertebral disc space narrowing. Advanced bilateral facet hypertrophy. Resultant moderate canal with moderate to severe bilateral L2 foraminal stenosis. L3-4: Mild disc bulge. Prior decompressive laminectomy. Moderate to advanced bilateral facet hypertrophy. No significant spinal stenosis. Severe left with mild to moderate right L3 foraminal narrowing. L4-5: Prior discectomy with interbody fusion, with posterior decompression. Residual bilateral facet arthrosis. No residual canal stenosis. Mild to moderate bilateral L4 foraminal narrowing. L5-S1: Diffuse disc bulge. Prior posterior decompression. Advanced bilateral facet arthrosis. No significant residual spinal stenosis. Severe bilateral L5 foraminal narrowing. IMPRESSION: CT THORACIC SPINE IMPRESSION 1. Acute minimally displaced fracture extending through the anterior-inferior aspect of the T10 vertebral body as above. No associated listhesis or malalignment. 2. No other acute traumatic injury identified within the thoracic spine. 3. Large central disc osteophyte complex at T11-12 with sequelae of prior decompressive laminectomy at T10-11 and T11-12. 4. Layering right pleural effusion with bibasilar atelectatic changes. CT LUMBAR SPINE IMPRESSION 1. No acute traumatic injury within the lumbar  spine. 2. Postsurgical changes from prior posterior decompression at L3-4 through L5-S1, with interbody fusion at L4-5. 3. 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 Resultant moderate spinal stenosis at L2-3, with moderate to advanced multilevel foraminal narrowing, most notable at L5-S1 bilaterally. Electronically Signed   By: Jeannine Boga M.D.   On: 06/16/2018 23:48   Ct Hip Left Wo Contrast  Result Date: 06/16/2018 CLINICAL DATA:  Left hip pain. EXAM: CT OF THE LEFT HIP WITHOUT CONTRAST TECHNIQUE: Multidetector CT imaging of the left hip was performed according to the standard protocol. Multiplanar CT image reconstructions were also generated. COMPARISON:  None. FINDINGS: Bones/Joint/Cartilage No fracture or dislocation. Normal alignment. No joint effusion. Mild left hip joint space narrowing with tiny marginal osteophytes. Mild osteoarthritis of bilateral sacroiliac. No aggressive osseous lesion. No periosteal reaction or bone destruction. Ligaments Ligaments are suboptimally evaluated by CT. Muscles and Tendons Muscles are normal. No muscle atrophy. No intramuscular fluid collection or hematoma. Soft tissue No fluid collection or hematoma.  No soft tissue mass. IMPRESSION: 1.  No acute osseous injury of the left hip. 2. 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 Electronically Signed   By: Kathreen Devoid   On: 06/16/2018 17:10   Dg Chest Port 1 View  Result Date: 06/16/2018 CLINICAL DATA:  Sepsis. EXAM: PORTABLE CHEST 1 VIEW COMPARISON:  05/18/2017 FINDINGS: Lungs are adequately inflated with mild hazy perihilar prominence suggesting mild vascular congestion. No lobar consolidation or effusion. Borderline stable cardiomegaly. Remainder of the exam is unchanged. IMPRESSION: Borderline stable cardiomegaly with suggestion of mild vascular congestion. Electronically Signed   By: Marin Olp M.D.   On: 06/16/2018 22:49   Dg Humerus Left  Result Date:  06/16/2018 CLINICAL DATA:  Left shoulder pain after fall. EXAM: LEFT HUMERUS - 2+ VIEW COMPARISON:  None. FINDINGS: Acute fracture of the proximal humerus involving the surgical neck and greater tuberosity. No dislocation. Moderate acromioclavicular joint space narrowing with bony hypertrophy. Soft tissues are unremarkable. IMPRESSION: Acute fracture of the proximal humerus surgical neck and greater tuberosity. Electronically Signed   By: Titus Dubin M.D.   On: 06/16/2018 15:30   Dg Hip  Unilat With Pelvis 2-3 Views Left  Result Date: 06/16/2018 CLINICAL DATA:  Left hip pain after fall. EXAM: DG HIP (WITH OR WITHOUT PELVIS) 2-3V LEFT COMPARISON:  Pelvis and right hip x-rays dated February 12, 2018. CT abdomen pelvis dated July 25, 2009. FINDINGS: No acute fracture or dislocation. The pubic symphysis and sacroiliac joints are intact. The hip joint spaces are relatively preserved. 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 Partially visualized cortical thickening of the right proximal femoral diaphysis with heterotopic ossification, grossly unchanged dating back to prior CTs from 2010 and likely posttraumatic in etiology. Degenerative changes of the lower lumbar spine. IMPRESSION: 1.  No acute osseous abnormality. Electronically Signed   By: Titus Dubin M.D.   On: 06/16/2018 15:40    Assessment/Plan:  Acute kidney injury secondary to hypotension shock, sepsis, rhabdomyolysis increased CPK, use of ARB medication as outpatient and vancomycin.  This appears to be a combination of septic and ischemic ATN.  Urinalysis reveals 30 protein no red blood cells no white blood cells few bacteria.  CT scan revealed no evidence of any hydronephrosis.  Pressures improved and the urine output appears to be increasing.  This appears to be independent of use of any diuretics.  Anticipate that she is going to have recovery of renal function.  Hypertension/volume she had a  hypotensive spell but her pressures of somewhat improved she has no evidence of any volume overload on examination and is breathing well using supplemental oxygen  Anemia we will continue to follow no use of ESA's at this present time  Thrombocytopenia this appears to be new I would discontinue any heparin based products  rule out HIT.  Could be related to her sepsis  Which peritoneal hemorrhage will need to follow CBC and repeat CT scan.  Is no compromise to the kidney at this particular time this is probably secondary to fall  Rhabdomyolysis with folic CPKs.  We can start her on some IV bicarbonate.  Sepsis awaiting cultures continuing daptomycin and cefepime.  I with avoiding vancomycin   LOS: 2 Sherril Croon @TODAY @11 :54 AM

## 2018-06-18 NOTE — Progress Notes (Signed)
Patient ID: [name], female   DOB: August 14, 1951, 67 y.o.   MRN: [id number]         [facility] Infectious [facility]  Date of Admission:  06/16/2018           Day 2 daptomycin        Day 2 cefepime ASSESSMENT: She continues to have low-grade fever of unknown cause.  It may be related to some retroperitoneal hemorrhage.  She has no evidence of UTI or pneumonia and blood cultures are negative so far.  She has no evidence of recurrent vertebral infection.  PLAN: 1. Continue current antibiotics pending further observation and final cultures. 2. I will follow-up in the morning  Principal Problem:   Fever Active Problems:   Chronic congestive heart failure (HCC)   Hypertension   Hyperlipidemia   Sepsis (HCC)   Nausea vomiting and diarrhea   Back pain   Humerus fracture   Thrombocytopenia (HCC)   CKD (chronic kidney disease) stage 3, GFR 30-59 ml/min (HCC)   Depression   Physical deconditioning   Scheduled Meds: . citalopram  40 mg Oral Daily  . cloNIDine  0.2 mg Oral BID  . gabapentin  300 mg Oral QHS  . metoprolol tartrate  25 mg Oral Daily  . pantoprazole  40 mg Oral Daily  . simvastatin  20 mg Oral QPM   Continuous Infusions: . ceFEPime (MAXIPIME) IV    . DAPTOmycin (CUBICIN)  IV Stopped (06/18/18 1556)   PRN Meds:.albuterol, loperamide, ondansetron (ZOFRAN) IV, oxyCODONE-acetaminophen, promethazine   SUBJECTIVE: He is complaining some pain in her left shoulder and her right leg.  She says the twitching in her right leg has gotten worse recently.  She is also had some back pain but it is better.  Review of Systems: Review of Systems  Constitutional: Positive for fever. Negative for chills and diaphoresis.  Respiratory: Negative for cough.   Cardiovascular: Negative for chest pain.  Gastrointestinal: Negative for abdominal pain, diarrhea, nausea and vomiting.  Genitourinary: Negative for dysuria.  Musculoskeletal: Positive for back pain and joint  pain.  Skin: Negative for rash.    Allergies  Allergen Reactions  . Vancomycin Other (See Comments)    Drug induced NEUTROPENIA  . Aspirin Other (See Comments)    Stomach bleeding  . Ibuprofen Other (See Comments)    Stomach bleeding  . Mushroom Extract Complex Hives and Itching  . Shellfish Allergy Hives and Itching  . Sulfa Antibiotics Hives and Itching  . Sulfasalazine Itching and Hives  . Tylenol [Acetaminophen] Itching  . Betadine [Povidone Iodine] Itching  . Coconut Flavor Itching  . Codeine Itching  . Eggs Or Egg-Derived Products Nausea And Vomiting  . Iodine Itching  . Ivp Dye [Iodinated Diagnostic Agents] Hives    Takes Benadryl 50mg  PO before receiving iodinated contrast   . Metrizamide Hives    Takes Benadryl 50mg  PO before receiving iodinated contrast    OBJECTIVE: Vitals:   06/18/18 1200 06/18/18 1517 06/18/18 1600 06/18/18 1622  BP:  (!) 119/46    Pulse: 74 67  64  Resp: 16 14  13   Temp: (!) 101.1 F (38.4 C)  99.1 F (37.3 C)   TempSrc: Oral  Oral   SpO2: 97% 99%  90%  Weight:      Height:       Body mass index is 37.2 kg/m.  Physical Exam  Constitutional:  She is drowsy but answers questions appropriately.  Cardiovascular: Normal rate, regular rhythm and  normal heart sounds.  Pulmonary/Chest: Effort normal and breath sounds normal.  Abdominal: Soft. She exhibits no distension. There is no tenderness.  Musculoskeletal:  She has healed incisions in both knees and her right shoulder.  There is no inflammation of her joints to suggest infection.  Skin: No rash noted.  Psychiatric: She has a normal mood and affect.    Lab Results Lab Results  Component Value Date   WBC 9.6 06/18/2018   HGB 9.5 (L) 06/18/2018   HCT 32.3 (L) 06/18/2018   MCV 87.8 06/18/2018   PLT 89 (L) 06/18/2018    Lab Results  Component Value Date   CREATININE 3.29 (H) 06/18/2018   BUN 19 06/18/2018   NA 137 06/18/2018   K 4.4 06/18/2018   CL 107 06/18/2018   CO2  21 (L) 06/18/2018    Lab Results  Component Value Date   ALT 14 06/18/2018   AST 28 06/18/2018   ALKPHOS 41 06/18/2018   BILITOT 0.9 06/18/2018     Microbiology: Recent Results (from the past 240 hour(s))  Blood Culture (routine x 2)     Status: None (Preliminary result)   Collection Time: 06/16/18  7:38 PM  Result Value Ref Range Status   Specimen Description   Final    BLOOD RIGHT ANTECUBITAL Performed at [facility], [place] [address] [place], [place] [address]    Special Requests   Final    BOTTLES DRAWN AEROBIC AND ANAEROBIC Blood Culture adequate volume Performed at [place] [address], [place], [place] [address]    Culture   Final    NO GROWTH 1 DAY Performed at [facility], [place] [address] [place], [place] [address]    Report Status PENDING  Incomplete  Blood Culture (routine x 2)     Status: None (Preliminary result)   Collection Time: 06/16/18  7:38 PM  Result Value Ref Range Status   Specimen Description   Final    BLOOD RIGHT FOREARM Performed at [place] [address] [place], [place] [address]    Special Requests   Final    BOTTLES DRAWN AEROBIC ONLY Blood Culture adequate volume Performed at [place] [address], [place], [place] [address]    Culture   Final    NO GROWTH 1 DAY Performed at [facility], [place] [address] [place], [place] [address]    Report Status PENDING  Incomplete  MRSA PCR Screening     Status: None   Collection Time: 06/17/18 12:07 PM  Result Value Ref Range Status   MRSA by PCR NEGATIVE NEGATIVE Final    Comment:        The GeneXpert MRSA Assay (FDA approved for NASAL specimens only), is one component of a comprehensive MRSA colonization surveillance program. It is not intended to diagnose MRSA infection nor to guide or monitor treatment for MRSA infections. Performed at [facility], [place]  [address], [place],  [address]     Michel Bickers, [place] [facility] [phone] pager   [phone] cell 06/18/2018, 5:48 PM

## 2018-06-18 NOTE — Progress Notes (Signed)
   Pharmacy Antibiotic Note  [name] is a 67 y.o. female admitted on 06/16/2018 with fall at home.  She had fever, hx of paraspinal abscess.  Pharmacy has been consulted for Cefepime and Daptomycin dosing.  06/18/18  Profound increase in SCr  Random vancomycin level= 14  Tm 100.6  Plan:  Checked vancomycin random level with change in renal function to determine appropriate start for daptomycin, will proceed as scheduled  Change cefepime to 2 g iv q 24h   Change daptomycin to 6 mg/kg (600mg ) IV q48h, first dose on 10/31 at 1400  CK weekly  MD, consider holding simvastatin during prolonged Daptomycin use.  Daptomycin and a statin may increase the risk of rhabdomyolysis. Follow up renal fxn, culture results, and clinical course.  F/u ability to de-escalate antibiotics.  Height: 5\' 3"  (160 cm) Weight: 210 lb (95.3 kg) IBW/kg (Calculated) : 52.4  Temp (24hrs), Avg:99.7 F (37.6 C), Min:98.7 F (37.1 C), Max:100.6 F (38.1 C)  Recent Labs  Lab 06/16/18 1848 06/16/18 1849 06/16/18 1915 06/16/18 1921 06/16/18 1957 06/16/18 2122 06/17/18 0119 06/17/18 0445 06/17/18 0931 06/18/18 0320 06/18/18 0847  WBC  --   --  12.9*  --   --   --  12.0* 12.9* 10.2 9.6  --   CREATININE 0.90  --   --  1.12*  --   --   --  1.35*  --  3.29*  --   LATICACIDVEN  --  6.14*  --   --  4.02* 1.08  --   --   --   --   --   VANCORANDOM  --   --   --   --   --   --   --   --   --   --  14    Estimated Creatinine Clearance: 17.7 mL/min (A) (by C-G formula based on SCr of 3.29 mg/dL (H)).    Allergies  Allergen Reactions  . Vancomycin Other (See Comments)    Drug induced NEUTROPENIA  . Aspirin Other (See Comments)    Stomach bleeding  . Ibuprofen Other (See Comments)    Stomach bleeding  . Mushroom Extract Complex Hives and Itching  . Shellfish Allergy Hives and Itching  . Sulfa Antibiotics Hives and Itching  . Sulfasalazine Itching and Hives  . Tylenol [Acetaminophen] Itching   . Betadine [Povidone Iodine] Itching  . Coconut Flavor Itching  . Codeine Itching  . Eggs Or Egg-Derived Products Nausea And Vomiting  . Iodine Itching  . Ivp Dye [Iodinated Diagnostic Agents] Hives    Takes Benadryl 50mg  PO before receiving iodinated contrast   . Metrizamide Hives    Takes Benadryl 50mg  PO before receiving iodinated contrast    Antimicrobials this admission: 10/29 vanc >> 10/30 10/29 cefepime >>  10/29 metronidazole >> 10/30 10/30 Daptomycin >>   Dose adjustments this admission:   Microbiology results: 10/29 UCx:  10/30 BCx:  10/30 MRSA PCR: negative   Thank you for allowing pharmacy to be a part of this patient's care.  Ulice Dash, PharmD Clinical Pharmacist Pager # [phone]  06/18/2018 11:21 AM

## 2018-06-18 NOTE — Evaluation (Signed)
Physical Therapy Evaluation Patient Details Name: [name] MRN: [id number] DOB: July 08, 1951 Today's Date: 06/18/2018   History of Present Illness  67 yo admitted 06/16/18 after a fall.  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 Admitted  to the hospital with hypertensive and tachycardic with a temperature of 100.8 lactic acid level 6.14.   X-ray evaluation showed an acute fracture of the left proximal humerus, minimally displaced T 10 fracture.CT scan that was consistent with acute retroperitoneal hemorrhage.  Clinical Impression  The  Patient presents with jerking of legs and arms and trunk spontaneously and  Uncontrolled. The patient  Will require mechanical lift from bed currently. Patient reports that the left arm is very painful. Patient's bed placed in chair position. Did not attempt applying TLSO as patient not moving out of the bed. Pt admitted with above diagnosis. Pt currently with functional limitations due to the deficits listed below (see PT Problem List).  Pt will benefit from skilled PT to increase their independence and safety with mobility to allow discharge to the venue listed below.       Follow Up Recommendations SNF    Equipment Recommendations  None recommended by PT    Recommendations for Other Services OT consult     Precautions / Restrictions Precautions Precautions: Fall Precaution Comments: per Ortho, TLSO when OOB, not designated  when to palce  brace. Required Braces or Orthoses: Spinal Brace;Sling Spinal Brace: Thoracolumbosacral orthotic Restrictions LUE Weight Bearing: Non weight bearing      Mobility  Bed Mobility Overal bed mobility: Needs Assistance             General bed mobility comments: placed bed in chair position, assisted  patient to lean forward requiring 2 max assist. patient held position with assist for ~30 seconds x 2  trials. unable to safely mobilize to sitting on the bed edge with  body jerking so  uch and LUE being very painful  Transfers                 General transfer comment: unable  Ambulation/Gait                Stairs            Wheelchair Mobility    Modified Rankin (Stroke Patients Only)       Balance Overall balance assessment: Needs assistance;History of Falls Sitting-balance support: Feet supported;Single extremity supported Sitting balance-Leahy Scale: Poor                                       Pertinent Vitals/Pain Pain Assessment: 0-10 Faces Pain Scale: Hurts worst Pain Location: left shoulder. Pain Descriptors / Indicators: Cramping;Discomfort;Grimacing;Guarding Pain Intervention(s): Limited activity within patient's tolerance;Premedicated before session;Repositioned    Home Living Family/patient expects to be discharged to:: Private residence Living Arrangements: Children Available Help at Discharge: Family Type of Home: Apartment Home Access: Level entry;Elevator     Home Layout: One level Home Equipment: Environmental consultant - 2 wheels;Bedside commode;Wheelchair - manual Additional Comments: daughter currently living with patient and works    Prior Function Level of Independence: Needs Water engineer / Transfers Assistance Needed: uses a Rw or cane  ADL's / Homemaking Assistance Needed: unsure about aide        Hand Dominance  Extremity/Trunk Assessment   Upper Extremity Assessment Upper Extremity Assessment: Defer to OT evaluation;LUE deficits/detail;RUE deficits/detail RUE Deficits / Details: jerking movements of the arms. does not maintain grasp LUE Deficits / Details: in sling which has slid to the  side.    Lower Extremity Assessment Lower Extremity Assessment: Generalized weakness;RLE deficits/detail;LLE deficits/detail RLE Deficits / Details: jerking movements, spontaneously and intermittently and  involauntarily.grossly 3/5 knee extension and dorsiflexion. 2+/5 hip  flexion LLE Deficits / Details: same as right    Cervical / Trunk Assessment Cervical / Trunk Assessment: Other exceptions Cervical / Trunk Exceptions: patient self supported trunk while hholding onto the rail with right hand  for several seconds  x 2 trials  Communication   Communication: No difficulties  Cognition Arousal/Alertness: Lethargic Behavior During Therapy: Restless;Anxious Overall Cognitive Status: Impaired/Different from baseline Area of Impairment: Attention;Following commands;Orientation                 Orientation Level: Place Current Attention Level: Sustained   Following Commands: Follows one step commands inconsistently       General Comments: The patient was not always alert, dozed at times.       General Comments      Exercises     Assessment/Plan    PT Assessment Patient needs continued PT services  PT Problem List Decreased strength;Decreased range of motion;Decreased activity tolerance;Decreased balance;Decreased mobility;Decreased knowledge of precautions;Decreased safety awareness;Decreased cognition;Decreased knowledge of use of DME;Pain       PT Treatment Interventions DME instruction;Functional mobility training;Therapeutic activities;Patient/family education;Therapeutic exercise    PT Goals (Current goals can be found in the Care Plan section)  Acute Rehab PT Goals Patient Stated Goal: to get up PT Goal Formulation: With patient Time For Goal Achievement: 07/02/18 Potential to Achieve Goals: Fair    Frequency Min 2X/week   Barriers to discharge Decreased caregiver support      Co-evaluation               AM-PAC PT "6 Clicks" Daily Activity  Outcome Measure Difficulty turning over in bed (including adjusting bedclothes, sheets and blankets)?: Unable Difficulty moving from lying on back to sitting on the side of the bed? : Unable Difficulty sitting  down on and standing up from a chair with arms (e.g., wheelchair, bedside commode, etc,.)?: Unable Help needed moving to and from a bed to chair (including a wheelchair)?: Total Help needed walking in hospital room?: Total Help needed climbing 3-5 steps with a railing? : Total 6 Click Score: 6    End of Session   Activity Tolerance: Patient limited by pain Patient left: in bed;with call bell/phone within reach;with bed alarm set Nurse Communication: Mobility status;Need for lift equipment PT Visit Diagnosis: Unsteadiness on feet (R26.81)    Time: 8938-1017 PT Time Calculation (min) (ACUTE ONLY): 33 min   Charges:   PT Evaluation $PT Eval Moderate Complexity: 1 Mod PT Treatments $Therapeutic Activity: 8-22 mins        Tresa Endo PT Acute Rehabilitation Services Pager [phone] Office [phone]   Claretha Cooper 06/18/2018, 2:24 PM

## 2018-06-18 NOTE — Progress Notes (Signed)
[name], who states she is a "spiritual child" of pt, is at bedside. Pt states it is ok to give her an update of her medical condition.

## 2018-06-19 DIAGNOSIS — D696 Thrombocytopenia, unspecified: Secondary | ICD-10-CM | POA: Diagnosis not present

## 2018-06-19 DIAGNOSIS — M549 Dorsalgia, unspecified: Secondary | ICD-10-CM | POA: Diagnosis not present

## 2018-06-19 DIAGNOSIS — G253 Myoclonus: Secondary | ICD-10-CM | POA: Diagnosis not present

## 2018-06-19 DIAGNOSIS — R627 Adult failure to thrive: Secondary | ICD-10-CM | POA: Diagnosis not present

## 2018-06-19 DIAGNOSIS — S22078A Other fracture of T9-T10 vertebra, initial encounter for closed fracture: Secondary | ICD-10-CM | POA: Diagnosis not present

## 2018-06-19 DIAGNOSIS — K661 Hemoperitoneum: Secondary | ICD-10-CM | POA: Diagnosis not present

## 2018-06-19 DIAGNOSIS — I5042 Chronic combined systolic (congestive) and diastolic (congestive) heart failure: Secondary | ICD-10-CM | POA: Diagnosis not present

## 2018-06-19 DIAGNOSIS — E7800 Pure hypercholesterolemia, unspecified: Secondary | ICD-10-CM | POA: Diagnosis not present

## 2018-06-19 DIAGNOSIS — M2578 Osteophyte, vertebrae: Secondary | ICD-10-CM | POA: Diagnosis not present

## 2018-06-19 DIAGNOSIS — Z91041 Radiographic dye allergy status: Secondary | ICD-10-CM | POA: Diagnosis not present

## 2018-06-19 DIAGNOSIS — Z91012 Allergy to eggs: Secondary | ICD-10-CM | POA: Diagnosis not present

## 2018-06-19 DIAGNOSIS — I959 Hypotension, unspecified: Secondary | ICD-10-CM | POA: Diagnosis not present

## 2018-06-19 DIAGNOSIS — Z91013 Allergy to seafood: Secondary | ICD-10-CM | POA: Diagnosis not present

## 2018-06-19 DIAGNOSIS — N179 Acute kidney failure, unspecified: Secondary | ICD-10-CM | POA: Diagnosis not present

## 2018-06-19 DIAGNOSIS — K5900 Constipation, unspecified: Secondary | ICD-10-CM | POA: Diagnosis not present

## 2018-06-19 DIAGNOSIS — S42252A Displaced fracture of greater tuberosity of left humerus, initial encounter for closed fracture: Secondary | ICD-10-CM | POA: Diagnosis not present

## 2018-06-19 DIAGNOSIS — Z888 Allergy status to other drugs, medicaments and biological substances status: Secondary | ICD-10-CM | POA: Diagnosis not present

## 2018-06-19 DIAGNOSIS — Z881 Allergy status to other antibiotic agents status: Secondary | ICD-10-CM | POA: Diagnosis not present

## 2018-06-19 DIAGNOSIS — Z882 Allergy status to sulfonamides status: Secondary | ICD-10-CM | POA: Diagnosis not present

## 2018-06-19 DIAGNOSIS — I130 Hypertensive heart and chronic kidney disease with heart failure and stage 1 through stage 4 chronic kidney disease, or unspecified chronic kidney disease: Secondary | ICD-10-CM | POA: Diagnosis not present

## 2018-06-19 DIAGNOSIS — S42212A Unspecified displaced fracture of surgical neck of left humerus, initial encounter for closed fracture: Secondary | ICD-10-CM | POA: Diagnosis not present

## 2018-06-19 DIAGNOSIS — A419 Sepsis, unspecified organism: Secondary | ICD-10-CM | POA: Diagnosis not present

## 2018-06-19 DIAGNOSIS — G9341 Metabolic encephalopathy: Secondary | ICD-10-CM | POA: Diagnosis not present

## 2018-06-19 DIAGNOSIS — N183 Chronic kidney disease, stage 3 (moderate): Secondary | ICD-10-CM | POA: Diagnosis not present

## 2018-06-19 DIAGNOSIS — M79604 Pain in right leg: Secondary | ICD-10-CM | POA: Diagnosis not present

## 2018-06-19 DIAGNOSIS — Z6837 Body mass index (BMI) 37.0-37.9, adult: Secondary | ICD-10-CM | POA: Diagnosis not present

## 2018-06-19 DIAGNOSIS — M48061 Spinal stenosis, lumbar region without neurogenic claudication: Secondary | ICD-10-CM | POA: Diagnosis not present

## 2018-06-19 DIAGNOSIS — Z886 Allergy status to analgesic agent status: Secondary | ICD-10-CM | POA: Diagnosis not present

## 2018-06-19 DIAGNOSIS — F329 Major depressive disorder, single episode, unspecified: Secondary | ICD-10-CM | POA: Diagnosis not present

## 2018-06-19 DIAGNOSIS — N281 Cyst of kidney, acquired: Secondary | ICD-10-CM | POA: Diagnosis not present

## 2018-06-19 DIAGNOSIS — M4300 Spondylolysis, site unspecified: Secondary | ICD-10-CM | POA: Diagnosis not present

## 2018-06-19 DIAGNOSIS — E785 Hyperlipidemia, unspecified: Secondary | ICD-10-CM | POA: Diagnosis not present

## 2018-06-19 DIAGNOSIS — D649 Anemia, unspecified: Secondary | ICD-10-CM | POA: Diagnosis not present

## 2018-06-19 DIAGNOSIS — M6282 Rhabdomyolysis: Secondary | ICD-10-CM | POA: Diagnosis not present

## 2018-06-19 DIAGNOSIS — R579 Shock, unspecified: Secondary | ICD-10-CM | POA: Diagnosis not present

## 2018-06-19 DIAGNOSIS — I5032 Chronic diastolic (congestive) heart failure: Secondary | ICD-10-CM | POA: Diagnosis not present

## 2018-06-19 DIAGNOSIS — M25512 Pain in left shoulder: Secondary | ICD-10-CM | POA: Diagnosis not present

## 2018-06-19 DIAGNOSIS — R509 Fever, unspecified: Secondary | ICD-10-CM | POA: Diagnosis not present

## 2018-06-19 DIAGNOSIS — M1612 Unilateral primary osteoarthritis, left hip: Secondary | ICD-10-CM | POA: Diagnosis not present

## 2018-06-19 DIAGNOSIS — J42 Unspecified chronic bronchitis: Secondary | ICD-10-CM | POA: Diagnosis not present

## 2018-06-19 LAB — CBC WITH DIFFERENTIAL/PLATELET
ABS IMMATURE GRANULOCYTES: 0.13 10*3/uL — AB (ref 0.00–0.07)
Basophils Absolute: 0 10*3/uL (ref 0.0–0.1)
Basophils Relative: 0 %
EOS PCT: 1 %
Eosinophils Absolute: 0.1 10*3/uL (ref 0.0–0.5)
HEMATOCRIT: 32 % — AB (ref 36.0–46.0)
Hemoglobin: 9.2 g/dL — ABNORMAL LOW (ref 12.0–15.0)
IMMATURE GRANULOCYTES: 1 %
LYMPHS PCT: 11 %
Lymphs Abs: 1 10*3/uL (ref 0.7–4.0)
MCH: 25.3 pg — ABNORMAL LOW (ref 26.0–34.0)
MCHC: 28.8 g/dL — ABNORMAL LOW (ref 30.0–36.0)
MCV: 88.2 fL (ref 80.0–100.0)
MONO ABS: 2.9 10*3/uL — AB (ref 0.1–1.0)
Monocytes Relative: 32 %
Neutro Abs: 5.2 10*3/uL (ref 1.7–7.7)
Neutrophils Relative %: 55 %
Platelets: 81 10*3/uL — ABNORMAL LOW (ref 150–400)
RBC: 3.63 MIL/uL — ABNORMAL LOW (ref 3.87–5.11)
RDW: 18.6 % — ABNORMAL HIGH (ref 11.5–15.5)
WBC: 9.3 10*3/uL (ref 4.0–10.5)
nRBC: 0 % (ref 0.0–0.2)

## 2018-06-19 LAB — BASIC METABOLIC PANEL
ANION GAP: 9 (ref 5–15)
BUN: 26 mg/dL — ABNORMAL HIGH (ref 8–23)
CO2: 19 mmol/L — AB (ref 22–32)
Calcium: 7.5 mg/dL — ABNORMAL LOW (ref 8.9–10.3)
Chloride: 109 mmol/L (ref 98–111)
Creatinine, Ser: 2.21 mg/dL — ABNORMAL HIGH (ref 0.44–1.00)
GFR calc Af Amer: 25 mL/min — ABNORMAL LOW (ref >60)
GFR, EST NON AFRICAN AMERICAN: 22 mL/min — AB (ref >60)
GLUCOSE: 116 mg/dL — AB (ref 70–99)
POTASSIUM: 4.5 mmol/L (ref 3.5–5.1)
Sodium: 137 mmol/L (ref 135–145)

## 2018-06-19 LAB — URINE CULTURE: Culture: NO GROWTH

## 2018-06-19 MED ORDER — CLONIDINE HCL 0.1 MG PO TABS
0.1000 mg | ORAL_TABLET | Freq: Two times a day (BID) | ORAL | Status: DC
  Administered 2018-06-19 – 2018-06-20 (×2): 0.1 mg via ORAL
  Filled 2018-06-19 (×2): qty 1

## 2018-06-19 MED ORDER — METOPROLOL TARTRATE 25 MG PO TABS
25.0000 mg | ORAL_TABLET | Freq: Two times a day (BID) | ORAL | Status: DC
  Administered 2018-06-19 – 2018-06-20 (×2): 25 mg via ORAL
  Filled 2018-06-19 (×2): qty 1

## 2018-06-19 NOTE — Progress Notes (Signed)
Patient ID: [name], female   DOB: 1951/05/26, 67 y.o.   MRN: [id number]         [facility]  Date of Admission:  06/16/2018           Day 3 daptomycin        Day 3 cefepime ASSESSMENT: She continues to have low-grade fever of unknown cause.  All cultures remain negative.  She has no evidence of recurrent vertebral infection.  Seems to be improving.  Consider stopping empiric antibiotics morning.  PLAN: 1. Continue current antibiotics pending further observation and final cultures. 2. I will follow-up in the morning  Principal Problem:   Fever Active Problems:   Chronic congestive heart failure (HCC)   Hypertension   Hyperlipidemia   Sepsis (HCC)   Nausea vomiting and diarrhea   Back pain   Humerus fracture   Thrombocytopenia (HCC)   CKD (chronic kidney disease) stage 3, GFR 30-59 ml/min (HCC)   Depression   Physical deconditioning   Fracture of neck of left humerus, closed, initial encounter   Scheduled Meds: . citalopram  40 mg Oral Daily  . cloNIDine  0.2 mg Oral BID  . gabapentin  300 mg Oral QHS  . metoprolol tartrate  25 mg Oral Daily  . pantoprazole  40 mg Oral Daily  . senna-docusate  1 tablet Oral BID  . simvastatin  20 mg Oral QPM   Continuous Infusions: . sodium chloride 250 mL (06/18/18 2208)  . ceFEPime (MAXIPIME) IV Stopped (06/18/18 2241)  . DAPTOmycin (CUBICIN)  IV Stopped (06/18/18 1556)   PRN Meds:.sodium chloride, albuterol, loperamide, ondansetron (ZOFRAN) IV, oxyCODONE-acetaminophen, promethazine   SUBJECTIVE: She is still having some back, left shoulder and right leg pain but is feeling better.  Review of Systems: Review of Systems  Constitutional: Positive for fever. Negative for chills and diaphoresis.  Respiratory: Negative for cough.   Cardiovascular: Negative for chest pain.  Gastrointestinal: Negative for abdominal pain, diarrhea, nausea and vomiting.  Genitourinary: Negative for dysuria.    Musculoskeletal: Positive for back pain and joint pain.  Skin: Negative for rash.    Allergies  Allergen Reactions  . Vancomycin Other (See Comments)    Drug induced NEUTROPENIA  . Aspirin Other (See Comments)    Stomach bleeding  . Ibuprofen Other (See Comments)    Stomach bleeding  . Mushroom Extract Complex Hives and Itching  . Shellfish Allergy Hives and Itching  . Sulfa Antibiotics Hives and Itching  . Sulfasalazine Itching and Hives  . Tylenol [Acetaminophen] Itching  . Betadine [Povidone Iodine] Itching  . Coconut Flavor Itching  . Codeine Itching  . Eggs Or Egg-Derived Products Nausea And Vomiting  . Iodine Itching  . Ivp Dye [Iodinated Diagnostic Agents] Hives    Takes Benadryl 50mg  PO before receiving iodinated contrast   . Metrizamide Hives    Takes Benadryl 50mg  PO before receiving iodinated contrast    OBJECTIVE: Vitals:   06/19/18 0838 06/19/18 0959 06/19/18 1001 06/19/18 1004  BP:   (!) 124/39 (!) 124/39  Pulse:  81  81  Resp:  19    Temp: 99 F (37.2 C)     TempSrc: Oral     SpO2:  98%    Weight:      Height:       Body mass index is 37.2 kg/m.  Physical Exam  Constitutional:  She is more alert and talkative.  Cardiovascular: Normal rate, regular rhythm and normal heart sounds.  Pulmonary/Chest: Effort normal and breath sounds normal.  Abdominal: Soft. She exhibits no distension. There is no tenderness.  Musculoskeletal:  She continues to have some spontaneous jerking of her right leg.  Skin: No rash noted.  Psychiatric: She has a normal mood and affect.    Lab Results Lab Results  Component Value Date   WBC 9.3 06/19/2018   HGB 9.2 (L) 06/19/2018   HCT 32.0 (L) 06/19/2018   MCV 88.2 06/19/2018   PLT 81 (L) 06/19/2018    Lab Results  Component Value Date   CREATININE 2.21 (H) 06/19/2018   BUN 26 (H) 06/19/2018   NA 137 06/19/2018   K 4.5 06/19/2018   CL 109 06/19/2018   CO2 19 (L) 06/19/2018    Lab Results  Component Value  Date   ALT 14 06/18/2018   AST 28 06/18/2018   ALKPHOS 41 06/18/2018   BILITOT 0.9 06/18/2018     Microbiology: Recent Results (from the past 240 hour(s))  Blood Culture (routine x 2)     Status: None (Preliminary result)   Collection Time: 06/16/18  7:38 PM  Result Value Ref Range Status   Specimen Description   Final    BLOOD RIGHT ANTECUBITAL Performed at [facility], [place] [address], [place], [place] [address]    Special Requests   Final    BOTTLES DRAWN AEROBIC AND ANAEROBIC Blood Culture adequate volume Performed at [place] [address] [place], [place] [address]    Culture   Final    NO GROWTH 1 DAY Performed at [facility], [place] [address] [place], [place] [address]    Report Status PENDING  Incomplete  Blood Culture (routine x 2)     Status: None (Preliminary result)   Collection Time: 06/16/18  7:38 PM  Result Value Ref Range Status   Specimen Description   Final    BLOOD RIGHT FOREARM Performed at [place] [address] [place], [place] [address]    Special Requests   Final    BOTTLES DRAWN AEROBIC ONLY Blood Culture adequate volume Performed at [place] [address] [place], [place] [address]    Culture   Final    NO GROWTH 1 DAY Performed at [facility], [place] [address], [place], [place] [address]    Report Status PENDING  Incomplete  Culture, Urine     Status: None   Collection Time: 06/16/18  7:38 PM  Result Value Ref Range Status   Specimen Description   Final    URINE, RANDOM Performed at [place] [address] [place], [place] [address]    Special Requests   Final    NONE Performed at [facility], [place] [address] [place], [place] [address]    Culture   Final    NO GROWTH Performed at [facility], [place] [address] [place],  [address]    Report Status 06/19/2018 FINAL  Final    MRSA PCR Screening     Status: None   Collection Time: 06/17/18 12:07 PM  Result Value Ref Range Status   MRSA by PCR NEGATIVE NEGATIVE Final    Comment:        The GeneXpert MRSA Assay (FDA approved for NASAL specimens only), is one component of a comprehensive MRSA colonization surveillance program. It is not intended to diagnose MRSA infection nor to guide or monitor treatment for MRSA infections.  Performed at [facility], [place] [address] [place], [place] [address]     Michel Bickers, [facility] [phone] pager   [phone] cell 06/19/2018, 10:27 AM

## 2018-06-19 NOTE — Progress Notes (Signed)
PROGRESS NOTE  [name] RXV:[id number] DOB: 1950/12/14 DOA: 06/16/2018 PCP: [name], MD  Brief history:  Reports lives by herself, she reports tripped adn Mulliken facing down at home and lay on the floor for more than an hrs  Daughter reports patient has progressive weakness in the last few weeks She has fever of unclear source     HPI/Recap of past 24 hours:  tmax 101.1 last 24hrs Cr improving Still slow in answering questions, but appear more alert and oriented x3 She reports back pain and left arm pain worse at night, not able to sleep well, currently feeling better No bm  Assessment/Plan: Principal Problem:   Fever Active Problems:   Chronic congestive heart failure (HCC)   Hypertension   Hyperlipidemia   Sepsis (HCC)   Nausea vomiting and diarrhea   Back pain   Humerus fracture   Thrombocytopenia (HCC)   CKD (chronic kidney disease) stage 3, GFR 30-59 ml/min (HCC)   Depression   Physical deconditioning   Fracture of neck of left humerus, closed, initial encounter   Sepsis? -she has Fever 100.8,sinus tachycardia, heart rate 110-120's on presentation, wbc12.9, lactic acid 6.14 on presentation -UA with few bacteria, urine culture negative, blood culture negative, MRSA screening negative, CT spine imaging there is no concern of infection there -cxr with "Borderline stable cardiomegaly with suggestion of mild vascular congestion" -Patient reported having diarrhea at home, but no BM  since arrived to the hospital -Infectious disease consulted   AKI on CKDII -cr baseline 0.9 -cr jumped to 3.29 on second day of hospitalization -ct on presentation on obstruction,  -ua with few bacteria, urine culture no growth -bladder scan on 10/31 with 311cc urine,  foley inserted on 10/31 --worsening of cr likely multifactorial, she does has mild elevated ck, she received vanc on admission, -nephrology consulted -cr improving, today is 2.21   acute minimally  displaced fracture extending through the anterior-inferior aspect of the T10 vertebral body Admitting MD Dr Marlowe Sax discussed with neurosurgery PA on-call (Mr. Cato Mulligan) consulted who recommended "no surgical intervention is needed at this time.  Recommendation is to use TLSO brace and outpatient neurosurgery follow-up."  Minimally displaced L proximal humerus fracture  ortho Dr Griffin Basil consulted Per Dr Griffin Basil" NWB in sling x6 weeks, followup outpatient when discharged.  No PT or ROM of shoulder.  Sling at all times. OK to move hand and wrist while in sling. "  Acute retroperitoneum hemorrhage: Scattered soft tissue stranding within the retroperitoneum and pelvis, consistent with acute hemorrhage, overall small to moderate in volume.  Admitting MD Dr Marlowe Sax discussed with general surgery on call Dr Excell Seltzer who states "no surgical intervention is needed at this time." -blood pressure and hemoglobin stable, continue monitor  Acute metabolic Encephalopathy -She is drowsy slightly confused on presentation which is not her baseline -Stat CT scan no acute findings -Taper clonidine -seems improving  Nausea and vomiting, chronic diarrhea Non observed since admission  ct ab no obstruction   History of chronic combined CHF Last EF in 2015 was 50% with diffuse hyper hypokinesis, and grade 1 diastolic dysfunction -Currently no edema, will hold Lasix for now due to drowsiness and poor oral intake -Close monitor volume status  Hypertension  continue Lopressor,  Blood pressure fluctuating , taper clonidine  hold home meds losartan HCTZ for now, hold Lasix for now  Body mass index is 37.2 kg/m.   FTT: Per daughter patient has been mostly in bed for 72-month due to multiple back  issues.  She does walk to the car and go to church on the weekend PT OT eval Patient adamantly does not want to go to SNF   Code Status: full  Family Communication: patient daily and daughter at bedside on  10/30  Disposition Plan: remain in stepdown   Consultants:  ID  Procedures:  none  Antibiotics:  Vancx1 on 10/29 and x1 on 10/30 Cefepime from admission to current daptomycin from admission to current  Objective: BP (!) 131/47 (BP Location: Right Leg)   Pulse 69   Temp 98.9 F (37.2 C) (Oral)   Resp 16   Ht 5\' 3"  (1.6 m)   Wt 95.3 kg   SpO2 99%   BMI 37.20 kg/m   Intake/Output Summary (Last 24 hours) at 06/19/2018 0740 Last data filed at 06/19/2018 0600 Gross per 24 hour  Intake 313.43 ml  Output 750 ml  Net -436.57 ml   Filed Weights   06/16/18 1308  Weight: 95.3 kg    Exam: Patient is examined daily including today on 06/19/2018, exams remain the same as of yesterday except that has changed    General:  Less Drowsy, less confused, slow in answer questions  Cardiovascular: RRR  Respiratory: CTABL  Abdomen: Soft/ND/NT, positive BS  Musculoskeletal: No Edema, legs able to move more  Neuro: less drowsy, oriented x3, slightly slow in answer questions  Data Reviewed: Basic Metabolic Panel: Recent Labs  Lab 06/16/18 1848 06/16/18 1921 06/17/18 0445 06/18/18 0320 06/19/18 0354  NA 141 142  --  137 137  K 3.6 3.9  --  4.4 4.5  CL 106 109  --  107 109  CO2  --  20*  --  21* 19*  GLUCOSE 174* 169*  --  130* 116*  BUN 5* 8  --  19 26*  CREATININE 0.90 1.12* 1.35* 3.29* 2.21*  CALCIUM  --  8.8*  --  7.5* 7.5*  MG  --   --   --  1.5*  --    Liver Function Tests: Recent Labs  Lab 06/16/18 1921 06/18/18 0320  AST 41 28  ALT 16 14  ALKPHOS 49 41  BILITOT 0.6 0.9  PROT 7.4 6.6  ALBUMIN 3.9 3.3*   Recent Labs  Lab 06/18/18 0320  LIPASE 35   No results for input(s): AMMONIA in the last 168 hours. CBC: Recent Labs  Lab 06/16/18 1915 06/17/18 0119 06/17/18 0445 06/17/18 0931 06/18/18 0320 06/19/18 0354  WBC 12.9* 12.0* 12.9* 10.2 9.6 9.3  NEUTROABS 9.1*  --   --   --  4.9 5.2  HGB 12.7 11.1* 10.3* 10.3* 9.5* 9.2*  HCT 40.9 37.5  35.3* 35.0* 32.3* 32.0*  MCV 82.6 85.2 86.9 87.3 87.8 88.2  PLT 147* 137* 109* 107* 89* 81*   Cardiac Enzymes:   Recent Labs  Lab 06/16/18 2115 06/18/18 0320  CKTOTAL  --  868*  TROPONINI 0.03*  --    BNP (last 3 results) No results for input(s): BNP in the last 8760 hours.  ProBNP (last 3 results) No results for input(s): PROBNP in the last 8760 hours.  CBG: No results for input(s): GLUCAP in the last 168 hours.  Recent Results (from the past 240 hour(s))  Blood Culture (routine x 2)     Status: None (Preliminary result)   Collection Time: 06/16/18  7:38 PM  Result Value Ref Range Status   Specimen Description   Final    BLOOD RIGHT ANTECUBITAL Performed at [facility], [place] [address] [place], [place] [address]    Special Requests   Final    BOTTLES DRAWN AEROBIC AND ANAEROBIC Blood Culture adequate volume Performed at [place] [address], [place], [place] [address]    Culture   Final    NO GROWTH 1 DAY Performed at [facility], [place] [address], [place], [place] [address]    Report Status PENDING  Incomplete  Blood Culture (routine x 2)     Status: None (Preliminary result)   Collection Time: 06/16/18  7:38 PM  Result Value Ref Range Status   Specimen Description   Final    BLOOD RIGHT FOREARM Performed at [place] [address], [place], [place] [address]    Special Requests   Final    BOTTLES DRAWN AEROBIC ONLY Blood Culture adequate volume Performed at [place] [address], [place], [place] [address]    Culture   Final    NO GROWTH 1 DAY Performed at [facility], [place] [address] [place], [place] [address]    Report Status PENDING  Incomplete  MRSA PCR Screening     Status: None   Collection Time: 06/17/18 12:07 PM  Result Value Ref Range Status   MRSA by PCR NEGATIVE NEGATIVE Final    Comment:        The GeneXpert MRSA Assay (FDA approved  for NASAL specimens only), is one component of a comprehensive MRSA colonization surveillance program. It is not intended to diagnose MRSA infection nor to guide or monitor treatment for MRSA infections. Performed at [facility], [place] [address] [place], [place] [address]      Studies: US Renal  Result Date: 06/18/2018 CLINICAL DATA:  Elevation of creatinine. EXAM: RENAL / URINARY TRACT ULTRASOUND COMPLETE COMPARISON:  CT 06/16/2018. FINDINGS: Right Kidney: Renal measurement: 9.4 cm. Echogenicity within normal limits. 2.9 cm simple cyst right kidney. No hydronephrosis visualized. Left Kidney: Renal measurement: Not visualized. Exam very limited due to patient's body habitus and left arm injury. Bladder: Appears normal for degree of bladder distention. IMPRESSION: 1. Left kidney not visualized due to patient's body habitus and inability to move left arm. 2.  2.9 cm simple cyst right kidney. Electronically Signed   By: [place]   On: 06/18/2018 12:13    Scheduled Meds: . citalopram  40 mg Oral Daily  . cloNIDine  0.2 mg Oral BID  . gabapentin  300 mg Oral QHS  . metoprolol tartrate  25 mg Oral Daily  . pantoprazole  40 mg Oral Daily  . senna-docusate  1 tablet Oral BID  . simvastatin  20 mg Oral QPM    Continuous Infusions: . sodium chloride 250 mL (06/18/18 2208)  . ceFEPime (MAXIPIME) IV Stopped (06/18/18 2241)  . DAPTOmycin (CUBICIN)  IV Stopped (06/18/18 1556)     Time spent: 61mins,  I have personally reviewed and interpreted on  06/19/2018 daily labs, tele strips, imagings as discussed above under date review session and assessment and plans.  I reviewed all nursing notes, pharmacy notes, consultant notes,  vitals, pertinent old records  I have discussed plan of care as described above with RN , patient  on 06/19/2018   Florencia Reasons MD, PhD  [facility] Pager [phone]. If 7PM-7AM, please contact night-coverage at [email or web address], password  [facility] 06/19/2018, 7:40 AM  LOS: 3 days

## 2018-06-19 NOTE — Care Management Note (Signed)
Case Management Note  Patient Details  Name: [name] MRN: [id number] Date of Birth: 11-Oct-1950  Subjective/Objective:                  Fever and retroperitoneal hemorrhage, iv ns, iv maxipime and cubicin  Action/Plan: Following for cm needs  Expected Discharge Date:  (unknown)               Expected Discharge Plan:  Home/Self Care  In-House Referral:     Discharge planning Services  CM Consult  Post Acute Care Choice:    Choice offered to:     DME Arranged:    DME Agency:     HH Arranged:    HH Agency:     Status of Service:  In process, will continue to follow  If discussed at Long Length of Stay Meetings, dates discussed:    Additional Comments:  Leeroy Cha, RN 06/19/2018, 9:41 AM

## 2018-06-19 NOTE — Progress Notes (Signed)
[name]   Subjective:   Appears to be doing better this afternoon she is awake alert.  Her daughter [name] is in the room.  Did talk to her about her mother's condition in front of her mother I described to her renal failure and the fact that her mother seems to be recovering  Blood pressure 154/21 pulse 86 temperature 100.2 T sats 99% on room air  Sodium 137 potassium 4.5 chloride 109 CO2 is 19 glucose 116 BUN 26 creatinine 2.2 calcium 7.5 hemoglobin 9.2 WBC 9.3 platelets 81  Objective:  Vital signs in last 24 hours:  Temp:  [98.3 F (36.8 C)-100.4 F (38 C)] 100.2 F (37.9 C) (11/01 1600) Pulse Rate:  [67-86] 77 (11/01 1500) Resp:  [15-27] 26 (11/01 1500) BP: (99-151)/(39-70) 151/52 (11/01 1500) SpO2:  [97 %-100 %] 99 % (11/01 1500)  Weight change:  Filed Weights   06/16/18 1308  Weight: 95.3 kg    Intake/Output: I/O last 3 completed shifts: In: 513.4 [P.O.:240; IV Piggyback:273.4] Out: 750 [Urine:750]   Intake/Output this shift:  No intake/output data recorded.  CVS- RRR no murmurs rubs or gallops RS- CTA diminished breath sounds at bases ABD- BS present soft non-distended EXT-obese no peripheral edema   Basic Metabolic Panel: Recent Labs  Lab 06/16/18 1848 06/16/18 1921 06/17/18 0445 06/18/18 0320 06/19/18 0354  NA 141 142  --  137 137  K 3.6 3.9  --  4.4 4.5  CL 106 109  --  107 109  CO2  --  20*  --  21* 19*  GLUCOSE 174* 169*  --  130* 116*  BUN 5* 8  --  19 26*  CREATININE 0.90 1.12* 1.35* 3.29* 2.21*  CALCIUM  --  8.8*  --  7.5* 7.5*  MG  --   --   --  1.5*  --     Liver Function Tests: Recent Labs  Lab 06/16/18 1921 06/18/18 0320  AST 41 28  ALT 16 14  ALKPHOS 49 41  BILITOT 0.6 0.9  PROT 7.4 6.6  ALBUMIN 3.9 3.3*   Recent Labs  Lab 06/18/18 0320  LIPASE 35   No results for input(s): AMMONIA in the last 168 hours.  CBC: Recent Labs  Lab 06/16/18 1915 06/17/18 0119 06/17/18 0445  06/17/18 0931 06/18/18 0320 06/19/18 0354  WBC 12.9* 12.0* 12.9* 10.2 9.6 9.3  NEUTROABS 9.1*  --   --   --  4.9 5.2  HGB 12.7 11.1* 10.3* 10.3* 9.5* 9.2*  HCT 40.9 37.5 35.3* 35.0* 32.3* 32.0*  MCV 82.6 85.2 86.9 87.3 87.8 88.2  PLT 147* 137* 109* 107* 89* 81*    Cardiac Enzymes: Recent Labs  Lab 06/16/18 2115 06/18/18 0320  CKTOTAL  --  868*  TROPONINI 0.03*  --     BNP: Invalid input(s): POCBNP  CBG: No results for input(s): GLUCAP in the last 168 hours.  Microbiology: Results for orders placed or performed during the hospital encounter of 06/16/18  Blood Culture (routine x 2)     Status: None (Preliminary result)   Collection Time: 06/16/18  7:38 PM  Result Value Ref Range Status   Specimen Description   Final    BLOOD RIGHT ANTECUBITAL Performed at [facility] [address] [place], [place] [address]    Special Requests   Final    BOTTLES DRAWN AEROBIC AND ANAEROBIC Blood Culture adequate volume Performed at [facility] [address] [place],  [place] [address]    Culture   Final    NO GROWTH 2 DAYS Performed at [facility], [place] [address], [place], [place] [address]    Report Status PENDING  Incomplete  Blood Culture (routine x 2)     Status: None (Preliminary result)   Collection Time: 06/16/18  7:38 PM  Result Value Ref Range Status   Specimen Description   Final    BLOOD RIGHT FOREARM Performed at [facility] [address] [place], [place] [address]    Special Requests   Final    BOTTLES DRAWN AEROBIC ONLY Blood Culture adequate volume Performed at [facility] [address], [place], [place] [address]    Culture   Final    NO GROWTH 2 DAYS Performed at [facility] [address], [place], [place] [address]    Report Status PENDING  Incomplete  Culture, Urine     Status: None   Collection Time: 06/16/18  7:38 PM  Result Value Ref Range Status    Specimen Description   Final    URINE, RANDOM Performed at [facility] [address], [place], [place] [address]    Special Requests   Final    NONE Performed at [facility], [place] [address] [place], [place] [address]    Culture   Final    NO GROWTH Performed at [facility], [place] [address], [place], [place] [address]    Report Status 06/19/2018 FINAL  Final  MRSA PCR Screening     Status: None   Collection Time: 06/17/18 12:07 PM  Result Value Ref Range Status   MRSA by PCR NEGATIVE NEGATIVE Final    Comment:        The GeneXpert MRSA Assay (FDA approved for NASAL specimens only), is one component of a comprehensive MRSA colonization surveillance program. It is not intended to diagnose MRSA infection nor to guide or monitor treatment for MRSA infections. Performed at [facility], [place] [address] [place], [place] [address]     Coagulation Studies: No results for input(s): LABPROT, INR in the last 72 hours.  Urinalysis: Recent Labs    06/16/18 1938  COLORURINE YELLOW  LABSPEC 1.017  PHURINE 5.0  GLUCOSEU NEGATIVE  HGBUR SMALL*  BILIRUBINUR NEGATIVE  KETONESUR 20*  PROTEINUR 30*  NITRITE NEGATIVE  LEUKOCYTESUR SMALL*      Imaging: US Renal  Result Date: 06/18/2018 CLINICAL DATA:  Elevation of creatinine. EXAM: RENAL / URINARY TRACT ULTRASOUND COMPLETE COMPARISON:  CT 06/16/2018. FINDINGS: Right Kidney: Renal measurement: 9.4 cm. Echogenicity within normal limits. 2.9 cm simple cyst right kidney. No hydronephrosis visualized. Left Kidney: Renal measurement: Not visualized. Exam very limited due to patient's body habitus and left arm injury. Bladder: Appears normal for degree of bladder distention. IMPRESSION: 1. Left kidney not visualized due to patient's body habitus and inability to move left arm. 2.  2.9 cm simple cyst right kidney. Electronically Signed   By: Marcello Moores  Register   On:  06/18/2018 12:13     Medications:   . sodium chloride 250 mL (06/18/18 2208)  . ceFEPime (MAXIPIME) IV Stopped (06/18/18 2241)  . DAPTOmycin (CUBICIN)  IV Stopped (06/18/18 1556)   . citalopram  40 mg Oral Daily  . cloNIDine  0.2 mg Oral BID  . gabapentin  300 mg Oral QHS  . metoprolol tartrate  25 mg Oral Daily  . pantoprazole  40 mg Oral  Daily  . senna-docusate  1 tablet Oral BID  . simvastatin  20 mg Oral QPM   sodium chloride, albuterol, loperamide, ondansetron (ZOFRAN) IV, oxyCODONE-acetaminophen, promethazine  Assessment/ Plan:   Acute kidney injury secondary to hypotension shock sepsis rhabdomyolysis with an increased CPK use of an ARB as an outpatient and also vancomycin.  Appears to be a combination of septic and ischemic ATN urinalysis was fairly unremarkable except for some mild proteinuria CT scan revealed no evidence of hydronephrosis.  Her blood pressure appears to have improved.  She appears to be recovering from her acute renal failure  Hypertension/volume hypotensive spell which probably precipitated her acute kidney injury she appears to be fairly euvolemic on no oxygen.  Her obesity precludes accurate determination of her volume status clinically  Anemia stable at this time without the use of ESA's  Retroperitoneal hemorrhage will need follow-up with CT as well as serial checks on hemoglobin.  Per primary.  Etiology appears to have been secondary to trauma.  From cytopenia platelets dropping to 81 do not see any heparin products being used  Sepsis initiate assistance from Dr. Megan Salon is following up on cultures continues empiric antibiotics and considering discontinuation of daptomycin and cefepime in the a.m.   LOS: [place] @TODAY @5 :22 PM

## 2018-06-20 DIAGNOSIS — M6282 Rhabdomyolysis: Secondary | ICD-10-CM | POA: Diagnosis not present

## 2018-06-20 DIAGNOSIS — Z888 Allergy status to other drugs, medicaments and biological substances status: Secondary | ICD-10-CM | POA: Diagnosis not present

## 2018-06-20 DIAGNOSIS — Z91041 Radiographic dye allergy status: Secondary | ICD-10-CM | POA: Diagnosis not present

## 2018-06-20 DIAGNOSIS — M2550 Pain in unspecified joint: Secondary | ICD-10-CM

## 2018-06-20 DIAGNOSIS — R52 Pain, unspecified: Secondary | ICD-10-CM

## 2018-06-20 DIAGNOSIS — L299 Pruritus, unspecified: Secondary | ICD-10-CM | POA: Diagnosis not present

## 2018-06-20 DIAGNOSIS — S22078A Other fracture of T9-T10 vertebra, initial encounter for closed fracture: Secondary | ICD-10-CM | POA: Diagnosis not present

## 2018-06-20 DIAGNOSIS — R509 Fever, unspecified: Secondary | ICD-10-CM | POA: Diagnosis not present

## 2018-06-20 DIAGNOSIS — I959 Hypotension, unspecified: Secondary | ICD-10-CM | POA: Diagnosis not present

## 2018-06-20 DIAGNOSIS — K661 Hemoperitoneum: Secondary | ICD-10-CM | POA: Diagnosis not present

## 2018-06-20 DIAGNOSIS — N179 Acute kidney failure, unspecified: Secondary | ICD-10-CM | POA: Diagnosis not present

## 2018-06-20 DIAGNOSIS — Z91013 Allergy to seafood: Secondary | ICD-10-CM | POA: Diagnosis not present

## 2018-06-20 DIAGNOSIS — D649 Anemia, unspecified: Secondary | ICD-10-CM | POA: Diagnosis not present

## 2018-06-20 DIAGNOSIS — I5042 Chronic combined systolic (congestive) and diastolic (congestive) heart failure: Secondary | ICD-10-CM | POA: Diagnosis not present

## 2018-06-20 DIAGNOSIS — Z91012 Allergy to eggs: Secondary | ICD-10-CM | POA: Diagnosis not present

## 2018-06-20 DIAGNOSIS — G9341 Metabolic encephalopathy: Secondary | ICD-10-CM | POA: Diagnosis not present

## 2018-06-20 DIAGNOSIS — I5032 Chronic diastolic (congestive) heart failure: Secondary | ICD-10-CM | POA: Diagnosis not present

## 2018-06-20 DIAGNOSIS — S42212A Unspecified displaced fracture of surgical neck of left humerus, initial encounter for closed fracture: Secondary | ICD-10-CM | POA: Diagnosis not present

## 2018-06-20 DIAGNOSIS — Z881 Allergy status to other antibiotic agents status: Secondary | ICD-10-CM | POA: Diagnosis not present

## 2018-06-20 DIAGNOSIS — I130 Hypertensive heart and chronic kidney disease with heart failure and stage 1 through stage 4 chronic kidney disease, or unspecified chronic kidney disease: Secondary | ICD-10-CM | POA: Diagnosis not present

## 2018-06-20 DIAGNOSIS — A419 Sepsis, unspecified organism: Secondary | ICD-10-CM | POA: Diagnosis not present

## 2018-06-20 DIAGNOSIS — Z91018 Allergy to other foods: Secondary | ICD-10-CM | POA: Diagnosis not present

## 2018-06-20 DIAGNOSIS — Z882 Allergy status to sulfonamides status: Secondary | ICD-10-CM | POA: Diagnosis not present

## 2018-06-20 DIAGNOSIS — Z886 Allergy status to analgesic agent status: Secondary | ICD-10-CM | POA: Diagnosis not present

## 2018-06-20 LAB — CBC WITH DIFFERENTIAL/PLATELET
Abs Immature Granulocytes: 0.1 10*3/uL — ABNORMAL HIGH (ref 0.00–0.07)
BASOS PCT: 0 %
Basophils Absolute: 0 10*3/uL (ref 0.0–0.1)
EOS ABS: 0.1 10*3/uL (ref 0.0–0.5)
EOS PCT: 1 %
HCT: 29.6 % — ABNORMAL LOW (ref 36.0–46.0)
Hemoglobin: 9.4 g/dL — ABNORMAL LOW (ref 12.0–15.0)
Immature Granulocytes: 1 %
Lymphocytes Relative: 13 %
Lymphs Abs: 1.2 10*3/uL (ref 0.7–4.0)
MCH: 25.7 pg — AB (ref 26.0–34.0)
MCHC: 31.8 g/dL (ref 30.0–36.0)
MCV: 80.9 fL (ref 80.0–100.0)
MONO ABS: 3 10*3/uL — AB (ref 0.1–1.0)
Monocytes Relative: 32 %
NEUTROS PCT: 53 %
Neutro Abs: 4.9 10*3/uL (ref 1.7–7.7)
PLATELETS: 84 10*3/uL — AB (ref 150–400)
RBC: 3.66 MIL/uL — ABNORMAL LOW (ref 3.87–5.11)
RDW: 18.5 % — AB (ref 11.5–15.5)
WBC: 9.2 10*3/uL (ref 4.0–10.5)
nRBC: 0 % (ref 0.0–0.2)

## 2018-06-20 LAB — BASIC METABOLIC PANEL
Anion gap: 6 (ref 5–15)
BUN: 15 mg/dL (ref 8–23)
CALCIUM: 8.1 mg/dL — AB (ref 8.9–10.3)
CO2: 24 mmol/L (ref 22–32)
CREATININE: 1.08 mg/dL — AB (ref 0.44–1.00)
Chloride: 108 mmol/L (ref 98–111)
GFR calc Af Amer: 59 mL/min — ABNORMAL LOW (ref >60)
GFR calc non Af Amer: 51 mL/min — ABNORMAL LOW (ref >60)
GLUCOSE: 148 mg/dL — AB (ref 70–99)
Potassium: 4.2 mmol/L (ref 3.5–5.1)
Sodium: 138 mmol/L (ref 135–145)

## 2018-06-20 LAB — IRON AND TIBC
Iron: 12 ug/dL — ABNORMAL LOW (ref 28–170)
SATURATION RATIOS: 5 % — AB (ref 10.4–31.8)
TIBC: 263 ug/dL (ref 250–450)
UIBC: 251 ug/dL

## 2018-06-20 LAB — MAGNESIUM: Magnesium: 1.9 mg/dL (ref 1.7–2.4)

## 2018-06-20 LAB — CK: Total CK: 452 U/L — ABNORMAL HIGH (ref 38–234)

## 2018-06-20 MED ORDER — LABETALOL HCL 200 MG PO TABS
200.0000 mg | ORAL_TABLET | Freq: Two times a day (BID) | ORAL | Status: DC
  Administered 2018-06-20 – 2018-06-23 (×7): 200 mg via ORAL
  Filled 2018-06-20 (×9): qty 1

## 2018-06-20 MED ORDER — BISACODYL 10 MG RE SUPP: 10.0000 mg | Freq: Every day | RECTAL | Status: DC

## 2018-06-20 MED ORDER — BISACODYL 10 MG RE SUPP
10.0000 mg | Freq: Every day | RECTAL | Status: AC
  Administered 2018-06-20: 10 mg via RECTAL
  Filled 2018-06-20 (×2): qty 1

## 2018-06-20 MED ORDER — HYDRALAZINE HCL 20 MG/ML IJ SOLN: 5.0000 mg | Freq: Four times a day (QID) | INTRAMUSCULAR | Status: DC | PRN

## 2018-06-20 MED ORDER — DIPHENHYDRAMINE HCL 50 MG/ML IJ SOLN
12.5000 mg | Freq: Once | INTRAMUSCULAR | Status: AC
  Administered 2018-06-20: 12.5 mg via INTRAVENOUS
  Filled 2018-06-20: qty 1

## 2018-06-20 MED ORDER — CLONIDINE HCL 0.1 MG PO TABS: 0.1000 mg | ORAL_TABLET | Freq: Every day | ORAL | Status: DC

## 2018-06-20 MED ORDER — METOPROLOL TARTRATE 25 MG PO TABS: 50.0000 mg | ORAL_TABLET | Freq: Two times a day (BID) | ORAL | Status: DC

## 2018-06-20 MED ORDER — POLYETHYLENE GLYCOL 3350 17 G PO PACK
17.0000 g | PACK | Freq: Every day | ORAL | Status: DC
  Administered 2018-06-20: 17 g via ORAL
  Filled 2018-06-20 (×4): qty 1

## 2018-06-20 MED ORDER — HYDRALAZINE HCL 20 MG/ML IJ SOLN
5.0000 mg | Freq: Four times a day (QID) | INTRAMUSCULAR | Status: DC | PRN
  Administered 2018-06-20 – 2018-06-23 (×6): 5 mg via INTRAVENOUS
  Filled 2018-06-20 (×6): qty 1

## 2018-06-20 NOTE — Progress Notes (Signed)
PROGRESS NOTE  SWEDEN LESURE ZOX:[id number] DOB: 1950-09-13 DOA: 06/16/2018 PCP: [name], MD  Brief history:  Reports lives by herself, she reports tripped adn [place] facing down at home and lay on the floor for more than an hrs  Daughter reports patient has progressive weakness in the last few weeks She has fever of unclear source     HPI/Recap of past 24 hours:  tmax 100.6 last 24hrs Appear to continue to improve, she is alert and talking on the phone bp elevated, Foley in place with clear urine, 1.4liter urine output documented last 24hrs No bm  Assessment/Plan: Principal Problem:   Fever Active Problems:   Chronic congestive heart failure (HCC)   Hypertension   Hyperlipidemia   Sepsis (HCC)   Nausea vomiting and diarrhea   Back pain   Humerus fracture   Thrombocytopenia (HCC)   CKD (chronic kidney disease) stage 3, GFR 30-59 ml/min (HCC)   Depression   Physical deconditioning   Fracture of neck of left humerus, closed, initial encounter   Sepsis? -she has Fever 100.8,sinus tachycardia, heart rate 110-120's on presentation, wbc12.9, lactic acid 6.14 on presentation -UA with few bacteria, urine culture negative, blood culture negative, MRSA screening negative, CT spine imaging there is no concern of infection there -cxr with "Borderline stable cardiomegaly with suggestion of mild vascular congestion" -Patient reported having diarrhea at home, but no BM  since arrived to the hospital -Infectious disease consulted , abx per infectious disease  AKI on CKDII -cr baseline 0.9 -cr jumped to 3.29 on second day of hospitalization -ct on presentation on obstruction,  -ua with few bacteria, urine culture no growth -bladder scan on 10/31 with 311cc urine,  foley inserted on 10/31 --worsening of cr likely multifactorial, she does has mild elevated ck, she received vanc on admission, -nephrology consulted -cr improving, am lab pending today   acute minimally  displaced fracture extending through the anterior-inferior aspect of the T10 vertebral body Admitting MD Dr Marlowe Sax discussed with neurosurgery PA on-call (Mr. Cato Mulligan) consulted who recommended "no surgical intervention is needed at this time.  Recommendation is to use TLSO brace and outpatient neurosurgery follow-up."  Minimally displaced L proximal humerus fracture  ortho Dr Griffin Basil consulted Per Dr Griffin Basil" NWB in sling x6 weeks, followup outpatient when discharged.  No PT or ROM of shoulder.  Sling at all times. OK to move hand and wrist while in sling. "  Acute retroperitoneum hemorrhage: Scattered soft tissue stranding within the retroperitoneum and pelvis, consistent with acute hemorrhage, overall small to moderate in volume.  Admitting MD Dr Marlowe Sax discussed with general surgery on call Dr Excell Seltzer who states "no surgical intervention is needed at this time." -blood pressure and hemoglobin stable, continue monitor  Acute Anemia/thrombocytopenia Likely secondary to retroperitoneum hemorrage hgb and plt seems had nadir and start to rise bp stable, she denies ab pain or back pain Will hold off on repeat ct ab  Acute metabolic Encephalopathy -She is drowsy slightly confused on presentation which is not her baseline -Stat CT scan no acute findings -Taper clonidine to off, have discussed with patient ,she agrees to -seems improving  Nausea and vomiting, chronic diarrhea Non observed since admission  ct ab no obstruction   History of chronic combined CHF Last EF in 2015 was 50% with diffuse hyper hypokinesis, and grade 1 diastolic dysfunction -Currently no edema, will hold Lasix for now due to drowsiness and poor oral intake -Close monitor volume status  Hypertension  Blood pressure fluctuating ,  taper clonidine , increase lopressor, add on prn hydralzine hold home meds losartan HCTZ for now, hold Lasix for now  Body mass index is 37.2 kg/m.   FTT: Per daughter patient has  been mostly in bed for 53-month due to multiple back issues.  She does walk to the car and go to church on the weekend PT OT eval Patient adamantly does not want to go to SNF   Code Status: full  Family Communication: patient daily and daughter at bedside on 10/30  Disposition Plan: remain in stepdown, if fever free off abx, will transfer out stepdown tomorrow   Consultants:  ID  nephrology  Procedures:  none  Antibiotics:  Vancx1 on 10/29 and x1 on 10/30 Cefepime from admission to 11/2 daptomycin from admission to 11/2  Objective: BP (!) 180/51   Pulse 74   Temp 97.6 F (36.4 C) (Axillary)   Resp (!) 21   Ht 5\' 3"  (1.6 m)   Wt 95.3 kg   SpO2 96%   BMI 37.20 kg/m   Intake/Output Summary (Last 24 hours) at 06/20/2018 0735 Last data filed at 06/20/2018 0500 Gross per 24 hour  Intake -  Output 1400 ml  Net -1400 ml   Filed Weights   06/16/18 1308  Weight: 95.3 kg    Exam: Patient is examined daily including today on 06/20/2018, exams remain the same as of yesterday except that has changed    General:  Less Drowsy, less confused, slow in answer questions, oriented x3  Cardiovascular: RRR  Respiratory: CTABL  Abdomen: Soft/ND/NT, positive BS  Musculoskeletal: No Edema, legs able to move more  Neuro: less drowsy, oriented x3, slightly slow in answer questions  Data Reviewed: Basic Metabolic Panel: Recent Labs  Lab 06/16/18 1848 06/16/18 1921 06/17/18 0445 06/18/18 0320 06/19/18 0354  NA 141 142  --  137 137  K 3.6 3.9  --  4.4 4.5  CL 106 109  --  107 109  CO2  --  20*  --  21* 19*  GLUCOSE 174* 169*  --  130* 116*  BUN 5* 8  --  19 26*  CREATININE 0.90 1.12* 1.35* 3.29* 2.21*  CALCIUM  --  8.8*  --  7.5* 7.5*  MG  --   --   --  1.5*  --    Liver Function Tests: Recent Labs  Lab 06/16/18 1921 06/18/18 0320  AST 41 28  ALT 16 14  ALKPHOS 49 41  BILITOT 0.6 0.9  PROT 7.4 6.6  ALBUMIN 3.9 3.3*   Recent Labs  Lab 06/18/18 0320    LIPASE 35   No results for input(s): AMMONIA in the last 168 hours. CBC: Recent Labs  Lab 06/16/18 1915 06/17/18 0119 06/17/18 0445 06/17/18 0931 06/18/18 0320 06/19/18 0354  WBC 12.9* 12.0* 12.9* 10.2 9.6 9.3  NEUTROABS 9.1*  --   --   --  4.9 5.2  HGB 12.7 11.1* 10.3* 10.3* 9.5* 9.2*  HCT 40.9 37.5 35.3* 35.0* 32.3* 32.0*  MCV 82.6 85.2 86.9 87.3 87.8 88.2  PLT 147* 137* 109* 107* 89* 81*   Cardiac Enzymes:   Recent Labs  Lab 06/16/18 2115 06/18/18 0320  CKTOTAL  --  868*  TROPONINI 0.03*  --    BNP (last 3 results) No results for input(s): BNP in the last 8760 hours.  ProBNP (last 3 results) No results for input(s): PROBNP in the last 8760 hours.  CBG: No results for input(s): GLUCAP in the last 168  hours.  Recent Results (from the past 240 hour(s))  Blood Culture (routine x 2)     Status: None (Preliminary result)   Collection Time: 06/16/18  7:38 PM  Result Value Ref Range Status   Specimen Description   Final    BLOOD RIGHT ANTECUBITAL Performed at [facility] [address] [place], [place] [address]    Special Requests   Final    BOTTLES DRAWN AEROBIC AND ANAEROBIC Blood Culture adequate volume Performed at [facility] [address] [place], [place] [address]    Culture   Final    NO GROWTH 2 DAYS Performed at [facility] [address] [place], [place] [address]    Report Status PENDING  Incomplete  Blood Culture (routine x 2)     Status: None (Preliminary result)   Collection Time: 06/16/18  7:38 PM  Result Value Ref Range Status   Specimen Description   Final    BLOOD RIGHT FOREARM Performed at [facility] [address], [place], [place] [address]    Special Requests   Final    BOTTLES DRAWN AEROBIC ONLY Blood Culture adequate volume Performed at [facility] [address], [place], [place] [address]    Culture   Final    NO GROWTH 2  DAYS Performed at [facility] [address] [place], [place] [address]    Report Status PENDING  Incomplete  Culture, Urine     Status: None   Collection Time: 06/16/18  7:38 PM  Result Value Ref Range Status   Specimen Description   Final    URINE, RANDOM Performed at [facility] [address] [place], [place] [address]    Special Requests   Final    NONE Performed at [facility], [place] [address], [place], [place] [address]    Culture   Final    NO GROWTH Performed at [facility], [place] [address], [place],  [address]    Report Status 06/19/2018 FINAL  Final  MRSA PCR Screening     Status: None   Collection Time: 06/17/18 12:07 PM  Result Value Ref Range Status   MRSA by PCR NEGATIVE NEGATIVE Final    Comment:        The GeneXpert MRSA Assay (FDA approved for NASAL specimens only), is one component of a comprehensive MRSA colonization surveillance program. It is not intended to diagnose MRSA infection nor to guide or monitor treatment for MRSA infections. Performed at [facility], [place] [address] [place],  [address]      Studies: No results found.  Scheduled Meds: . citalopram  40 mg Oral Daily  . cloNIDine  0.1 mg Oral BID  . gabapentin  300 mg Oral QHS  . metoprolol tartrate  25 mg Oral BID  . pantoprazole  40 mg Oral Daily  . senna-docusate  1 tablet Oral BID  . simvastatin  20 mg Oral QPM    Continuous Infusions: . sodium chloride 250 mL (06/18/18 2208)  . ceFEPime (MAXIPIME) IV Stopped (06/19/18 2233)  . DAPTOmycin (CUBICIN)  IV Stopped (06/18/18 1556)     Time spent: 26mins,  I have personally reviewed and interpreted on  06/20/2018 daily labs, tele strips, imagings as discussed above under date review session and assessment and plans.  I reviewed all nursing notes, pharmacy notes, consultant notes,  vitals, pertinent old records  I have  discussed plan of  care as described above with RN , patient  on 06/20/2018   Florencia Reasons MD, PhD  [facility] Pager [phone]. If 7PM-7AM, please contact night-coverage at [email or web address], password [facility] 06/20/2018, 7:35 AM  LOS: 4 days

## 2018-06-20 NOTE — Progress Notes (Addendum)
[facility] ROUNDING NOTE   Subjective:   Doing better this morning pain better controlled appears to be awake alert answering questions appropriately wanted to know what of renal function was this morning.    Urine output 1.2 L 06/19/2018    blood pressure 177/82 pulse 79 temperature 98.8 and O2 sats 96%  Blood pressure 177/80 pulse 79 temperature 98.8 O2 sats 96% Sodium 137 potassium 4.5 chloride 109 CO2 19 glucose 116 BUN 26 creatinine 2.2 calcium 7.5 WBC 9.2 hemoglobin 9.4 platelets 84   Objective:  Vital signs in last 24 hours:  Temp:  [97.6 F (36.4 C)-100.6 F (38.1 C)] 98.8 F (37.1 C) (11/02 0815) Pulse Rate:  [71-89] 72 (11/02 1100) Resp:  [17-27] 19 (11/02 1100) BP: (134-191)/(21-103) 171/82 (11/02 1100) SpO2:  [93 %-100 %] 96 % (11/02 1100)  Weight change:  Filed Weights   06/16/18 1308  Weight: 95.3 kg    Intake/Output: I/O last 3 completed shifts: In: 73.4 [IV Piggyback:73.4] Out: 1800 [Urine:1800]   Intake/Output this shift:  Total I/O In: 211.3 [I.V.:6; IV Piggyback:205.3] Out: 285 [Urine:285]  CVS- RRR no murmurs rubs or gallops RS- CTA diminished breath sounds at bases ABD- BS present soft non-distended EXT-obese no peripheral edema   Basic Metabolic Panel: Recent Labs  Lab 06/16/18 1848 06/16/18 1921 06/17/18 0445 06/18/18 0320 06/19/18 0354 06/20/18 0653  NA 141 142  --  137 137  --   K 3.6 3.9  --  4.4 4.5  --   CL 106 109  --  107 109  --   CO2  --  20*  --  21* 19*  --   GLUCOSE 174* 169*  --  130* 116*  --   BUN 5* 8  --  19 26*  --   CREATININE 0.90 1.12* 1.35* 3.29* 2.21*  --   CALCIUM  --  8.8*  --  7.5* 7.5*  --   MG  --   --   --  1.5*  --  1.9    Liver Function Tests: Recent Labs  Lab 06/16/18 1921 06/18/18 0320  AST 41 28  ALT 16 14  ALKPHOS 49 41  BILITOT 0.6 0.9  PROT 7.4 6.6  ALBUMIN 3.9 3.3*   Recent Labs  Lab 06/18/18 0320  LIPASE 35   No results for input(s): AMMONIA in the last 168  hours.  CBC: Recent Labs  Lab 06/16/18 1915  06/17/18 0445 06/17/18 0931 06/18/18 0320 06/19/18 0354 06/20/18 0653  WBC 12.9*   < > 12.9* 10.2 9.6 9.3 9.2  NEUTROABS 9.1*  --   --   --  4.9 5.2 4.9  HGB 12.7   < > 10.3* 10.3* 9.5* 9.2* 9.4*  HCT 40.9   < > 35.3* 35.0* 32.3* 32.0* 29.6*  MCV 82.6   < > 86.9 87.3 87.8 88.2 80.9  PLT 147*   < > 109* 107* 89* 81* 84*   < > = values in this interval not displayed.    Cardiac Enzymes: Recent Labs  Lab 06/16/18 2115 06/18/18 0320  CKTOTAL  --  868*  TROPONINI 0.03*  --     BNP: Invalid input(s): POCBNP  CBG: No results for input(s): GLUCAP in the last 168 hours.  Microbiology: Results for orders placed or performed during the hospital encounter of 06/16/18  Blood Culture (routine x 2)     Status: None (Preliminary result)   Collection Time: 06/16/18  7:38 PM  Result Value Ref Range  Status   Specimen Description   Final    BLOOD RIGHT ANTECUBITAL Performed at [facility] [address], [place], [place] [address]    Special Requests   Final    BOTTLES DRAWN AEROBIC AND ANAEROBIC Blood Culture adequate volume Performed at [facility] [address], [place], [place] [address]    Culture   Final    NO GROWTH 3 DAYS Performed at [facility], [place] [address], [place], [place] [address]    Report Status PENDING  Incomplete  Blood Culture (routine x 2)     Status: None (Preliminary result)   Collection Time: 06/16/18  7:38 PM  Result Value Ref Range Status   Specimen Description   Final    BLOOD RIGHT FOREARM Performed at [facility] [address] [place], [place] [address]    Special Requests   Final    BOTTLES DRAWN AEROBIC ONLY Blood Culture adequate volume Performed at [facility] [address] [place], [place] [address]    Culture   Final    NO GROWTH 3 DAYS Performed at [facility], [place] [address]  [place], [place] [address]    Report Status PENDING  Incomplete  Culture, Urine     Status: None   Collection Time: 06/16/18  7:38 PM  Result Value Ref Range Status   Specimen Description   Final    URINE, RANDOM Performed at [facility] [address], [place], [place] [address]    Special Requests   Final    NONE Performed at [facility], [place] [address], [place], [place] [address]    Culture   Final    NO GROWTH Performed at [facility], [place] [address] [place], [place] [address]    Report Status 06/19/2018 FINAL  Final  MRSA PCR Screening     Status: None   Collection Time: 06/17/18 12:07 PM  Result Value Ref Range Status   MRSA by PCR NEGATIVE NEGATIVE Final    Comment:        The GeneXpert MRSA Assay (FDA approved for NASAL specimens only), is one component of a comprehensive MRSA colonization surveillance program. It is not intended to diagnose MRSA infection nor to guide or monitor treatment for MRSA infections. Performed at [facility], [place] [address] [place], [place] [address]     Coagulation Studies: No results for input(s): LABPROT, INR in the last 72 hours.  Urinalysis: No results for input(s): COLORURINE, LABSPEC, PHURINE, GLUCOSEU, HGBUR, BILIRUBINUR, KETONESUR, PROTEINUR, UROBILINOGEN, NITRITE, LEUKOCYTESUR in the last 72 hours.  Invalid input(s): APPERANCEUR    Imaging: No results found.   Medications:   . sodium chloride Stopped (06/19/18 2316)   . citalopram  40 mg Oral Daily  . cloNIDine  0.1 mg Oral BID  . gabapentin  300 mg Oral QHS  . metoprolol tartrate  50 mg Oral BID  . pantoprazole  40 mg Oral Daily  . senna-docusate  1 tablet Oral BID  . simvastatin  20 mg Oral QPM   sodium chloride, albuterol, hydrALAZINE, loperamide, ondansetron (ZOFRAN) IV, oxyCODONE-acetaminophen, promethazine  Assessment/ Plan:   Acute kidney injury secondary to hypotension shock sepsis  rhabdomyolysis with an increased CPK use of an ARB as an outpatient and also vancomycin.  Appears to be a combination of septic and ischemic ATN urinalysis was fairly unremarkable except for some mild proteinuria CT scan revealed no  evidence of hydronephrosis.  Her blood pressure appears to have improved.  She appears to be recovering from her acute renal failure creatinine now 2.2 improving to baseline of 1.5  Hypertension/volume will discontinue metoprolol and start labetalol 200 mg twice daily for better blood pressure control   Anemia stable at this time without the use of ESA's we will check iron studies  Retroperitoneal hemorrhage will need follow-up with CT as well as serial checks on hemoglobin.  Per primary.  Etiology appears to have been secondary to trauma.  From cytopenia platelets dropping to 81 do not see any heparin products being used  Sepsis appreciate assistance from Dr. Megan Salon have discontinued daptomycin and cefepime   LOS: [place] @TODAY @12 :45 PM

## 2018-06-20 NOTE — Progress Notes (Signed)
Patient ID: [name], female   DOB: 21-Apr-1951, 67 y.o.   MRN: [id number]         [facility] for Infectious Disease  Date of Admission:  06/16/2018           Day 4 daptomycin        Day 4 cefepime ASSESSMENT: She is still having some low-grade temperatures but her temperature trend is down.  There is no definitive evidence of any active infection.  I will stop empiric antibiotics now.  PLAN: 1. Discontinue daptomycin and cefepime 2. I will follow-up in the morning  Principal Problem:   Fever Active Problems:   Chronic congestive heart failure (HCC)   Hypertension   Hyperlipidemia   Sepsis (HCC)   Nausea vomiting and diarrhea   Back pain   Humerus fracture   Thrombocytopenia (HCC)   CKD (chronic kidney disease) stage 3, GFR 30-59 ml/min (HCC)   Depression   Physical deconditioning   Fracture of neck of left humerus, closed, initial encounter   Scheduled Meds: . citalopram  40 mg Oral Daily  . cloNIDine  0.1 mg Oral BID  . gabapentin  300 mg Oral QHS  . metoprolol tartrate  25 mg Oral BID  . pantoprazole  40 mg Oral Daily  . senna-docusate  1 tablet Oral BID  . simvastatin  20 mg Oral QPM   Continuous Infusions: . sodium chloride Stopped (06/19/18 2316)  . ceFEPime (MAXIPIME) IV Stopped (06/19/18 2233)  . DAPTOmycin (CUBICIN)  IV Stopped (06/18/18 1548)   PRN Meds:.sodium chloride, albuterol, hydrALAZINE, loperamide, ondansetron (ZOFRAN) IV, oxyCODONE-acetaminophen, promethazine   SUBJECTIVE: She continues to have pain all over.  Review of Systems: Review of Systems  Constitutional: Negative for chills, diaphoresis and fever.  Respiratory: Negative for cough.   Cardiovascular: Negative for chest pain.  Gastrointestinal: Negative for abdominal pain, diarrhea, nausea and vomiting.  Genitourinary: Negative for dysuria.  Musculoskeletal: Positive for back pain and joint pain.  Skin: Positive for itching. Negative for rash.    Allergies    Allergen Reactions  . Vancomycin Other (See Comments)    Drug induced NEUTROPENIA  . Aspirin Other (See Comments)    Stomach bleeding  . Ibuprofen Other (See Comments)    Stomach bleeding  . Mushroom Extract Complex Hives and Itching  . Shellfish Allergy Hives and Itching  . Sulfa Antibiotics Hives and Itching  . Sulfasalazine Itching and Hives  . Tylenol [Acetaminophen] Itching  . Betadine [Povidone Iodine] Itching  . Coconut Flavor Itching  . Codeine Itching  . Eggs Or Egg-Derived Products Nausea And Vomiting  . Iodine Itching  . Ivp Dye [Iodinated Diagnostic Agents] Hives    Takes Benadryl 50mg  PO before receiving iodinated contrast   . Metrizamide Hives    Takes Benadryl 50mg  PO before receiving iodinated contrast    OBJECTIVE: Vitals:   06/20/18 0815 06/20/18 0900 06/20/18 1000 06/20/18 1100  BP:  (!) 191/41 (!) 178/79 (!) 171/82  Pulse:  89 87 72  Resp:  (!) 26 (!) 21 19  Temp: 98.8 F (37.1 C)     TempSrc: Oral     SpO2:  97% 94% 96%  Weight:      Height:       Body mass index is 37.2 kg/m.  Physical Exam  Constitutional:  She alert and talkative.  Cardiovascular: Normal rate, regular rhythm and normal heart sounds.  Pulmonary/Chest: Effort normal and breath sounds normal.  Abdominal: Soft. She exhibits no distension. There is  no tenderness.  Musculoskeletal:  Right leg jerking has subsided.  Skin: No rash noted.  Psychiatric: She has a normal mood and affect.    Lab Results Lab Results  Component Value Date   WBC 9.2 06/20/2018   HGB 9.4 (L) 06/20/2018   HCT 29.6 (L) 06/20/2018   MCV 80.9 06/20/2018   PLT 84 (L) 06/20/2018    Lab Results  Component Value Date   CREATININE 2.21 (H) 06/19/2018   BUN 26 (H) 06/19/2018   NA 137 06/19/2018   K 4.5 06/19/2018   CL 109 06/19/2018   CO2 19 (L) 06/19/2018    Lab Results  Component Value Date   ALT 14 06/18/2018   AST 28 06/18/2018   ALKPHOS 41 06/18/2018   BILITOT 0.9 06/18/2018      Microbiology: Recent Results (from the past 240 hour(s))  Blood Culture (routine x 2)     Status: None (Preliminary result)   Collection Time: 06/16/18  7:38 PM  Result Value Ref Range Status   Specimen Description   Final    BLOOD RIGHT ANTECUBITAL Performed at [facility], [place] [address] [place], [place] [address]    Special Requests   Final    BOTTLES DRAWN AEROBIC AND ANAEROBIC Blood Culture adequate volume Performed at [place] [address], [place], [place] [address]    Culture   Final    NO GROWTH 3 DAYS Performed at [facility], [place] [address], [place], [place] [address]    Report Status PENDING  Incomplete  Blood Culture (routine x 2)     Status: None (Preliminary result)   Collection Time: 06/16/18  7:38 PM  Result Value Ref Range Status   Specimen Description   Final    BLOOD RIGHT FOREARM Performed at [place] [address], [place], [place] [address]    Special Requests   Final    BOTTLES DRAWN AEROBIC ONLY Blood Culture adequate volume Performed at [facility] [address], [place], [place] [address]    Culture   Final    NO GROWTH 3 DAYS Performed at [facility], [place] [address], [place], [place] [address]    Report Status PENDING  Incomplete  Culture, Urine     Status: None   Collection Time: 06/16/18  7:38 PM  Result Value Ref Range Status   Specimen Description   Final    URINE, RANDOM Performed at [place] [address] [place], [place] [address]    Special Requests   Final    NONE Performed at [facility], [place] [address], [place], [place] [address]    Culture   Final    NO GROWTH Performed at [facility], [place] [address], [place], [place] [address]    Report Status 06/19/2018 FINAL  Final  MRSA PCR Screening     Status: None   Collection Time: 06/17/18 12:07 PM  Result Value Ref Range  Status   MRSA by PCR NEGATIVE NEGATIVE Final    Comment:        The GeneXpert MRSA Assay (FDA approved for NASAL specimens only), is one component of a comprehensive MRSA colonization surveillance program. It is not intended to diagnose MRSA infection nor to guide or monitor treatment for MRSA infections. Performed at [facility], [place] [address], [place],  [address]     Michel Bickers, [place] for  Infectious Disease [facility] [phone] pager   [phone] cell 06/20/2018, 11:44 AM

## 2018-06-20 NOTE — Evaluation (Signed)
Occupational Therapy Evaluation Patient Details Name: [name] MRN: [id number] DOB: [birth year] Today's Date: 06/20/2018    History of Present Illness 67 yo admitted 06/16/18 after a fall.  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 Admitted  to the hospital with hypertensive and tachycardic with a temperature of 100.8 lactic acid level 6.14.   X-ray evaluation showed an acute fracture of the left proximal humerus, minimally displaced T 10 fracture.CT scan that was consistent with acute retroperitoneal hemorrhage.   Clinical Impression   Pt was admitted for the above. She is very limited in adls and mobility due to R shoulder weakness, immobilized L arm, back pain and deconditioning. She will benefit from continued therapy.  Adjusted sling (added extension to strap) and worked on rolling to reposition today.  Pt will benefit from SNF, but she is not agreeable at this time. She will need 2 caregivers at the same time to be able to manage, even from a bed level at home    Follow Up Recommendations  SNF(pt is refusing; will need 2 caregivers simulatenously at hom)    [facility] bed(unable to get OOB; ? lift)    Recommendations for Other Services       Precautions / Restrictions Precautions Precautions: Fall Precaution Comments: per Ortho, TLSO when OOB, not designated  when to palce  brace. Required Braces or Orthoses: Spinal Brace;Sling Spinal Brace: Thoracolumbosacral orthotic Restrictions LUE Weight Bearing: Non weight bearing      Mobility Bed Mobility               General bed mobility comments: total A +2 to roll to bil sides  Transfers                 General transfer comment: unable    Balance                                           ADL either performed or assessed with clinical judgement   ADL  Overall ADL's : Needs assistance/impaired Eating/Feeding: Minimal assistance;Bed level(for set up)   Grooming: Minimal assistance;Moderate assistance;Bed level   Upper Body Bathing: Maximal assistance;Bed level   Lower Body Bathing: Total assistance;+2 for physical assistance;Bed level   Upper Body Dressing : Maximal assistance;Bed level   Lower Body Dressing: Total assistance;+2 for physical assistance;Bed level                 General ADL Comments: pt has assist for bed mobility at baseline.  Needs total +2 to roll to bil sides.  Adjusted sling and brought velfoam to extends waist strap to help hold it in place. Also placed pillow behind upper arm     Vision         Perception     Praxis      Pertinent Vitals/Pain Pain Assessment: Faces Faces Pain Scale: Hurts whole lot Pain Location: left shoulder. Pain Descriptors / Indicators: Aching Pain Intervention(s): Limited activity within patient's tolerance;Monitored during session;Premedicated before session;Repositioned     Hand Dominance Right   Extremity/Trunk Assessment Upper Extremity Assessment Upper Extremity Assessment: Generalized weakness;RUE deficits/detail;LUE deficits/detail RUE Deficits / Details: h/o shoulder sx. Able to feed self with set up and HOB raised LUE Deficits / Details: immobilized; able to move fingers  Communication Communication Communication: No difficulties   Cognition Arousal/Alertness: Awake/alert Behavior During Therapy: WFL for tasks assessed/performed Overall Cognitive Status: No family/caregiver present to determine baseline cognitive functioning                                 General Comments: followed all commands, ? aware of how much assistance she needs:  only want sto go home   General Comments       Exercises     Shoulder Instructions      Home Living Family/patient expects to be discharged to:: Private residence Living Arrangements:  Children Available Help at Discharge: Personal care attendant;Family                         Home Equipment: Walker - 2 wheels;Bedside commode;Wheelchair - manual          Prior Functioning/Environment Level of Independence: Needs assistance    ADL's / Homemaking Assistance Needed: assistance for bathing and LB adls   Comments: has aide 2 hours per day        OT Problem List: Decreased strength;Decreased activity tolerance;Impaired balance (sitting and/or standing);Decreased knowledge of use of DME or AE;Pain      OT Treatment/Interventions: Self-care/ADL training;Energy conservation;DME and/or AE instruction;Therapeutic activities;Balance training;Patient/family education    OT Goals(Current goals can be found in the care plan section) Acute Rehab OT Goals Patient Stated Goal: home OT Goal Formulation: With patient Time For Goal Achievement: 07/04/18 Potential to Achieve Goals: Fair ADL Goals Additional ADL Goal #1: pt will roll to L with max +2 for adls Additional ADL Goal #2: pt will sit EOB with min A once assisted with max +2 via sidelying> sit for 5 minutes in preparation for toilet transfers  OT Frequency: Min 2X/week   Barriers to D/C:            Co-evaluation              AM-PAC PT "6 Clicks" Daily Activity     Outcome Measure Help from another person eating meals?: A Little Help from another person taking care of personal grooming?: A Lot Help from another person toileting, which includes using toliet, bedpan, or urinal?: Total Help from another person bathing (including washing, rinsing, drying)?: A Lot Help from another person to put on and taking off regular upper body clothing?: A Lot Help from another person to put on and taking off regular lower body clothing?: Total 6 Click Score: 11   End of Session    Activity Tolerance: Patient limited by fatigue;Patient limited by pain;Treatment limited secondary to medical complications  (Comment) Patient left: in bed;with call bell/phone within reach;with bed alarm set;with nursing/sitter in room  OT Visit Diagnosis: Muscle weakness (generalized) (M62.81);Pain Pain - Right/Left: Left Pain - part of body: Shoulder                Time: 0623-7628 OT Time Calculation (min): 34 min Charges:  OT General Charges $OT Visit: 1 Visit OT Evaluation $OT Eval Moderate Complexity: 1 Mod OT Treatments $Self Care/Home Management : 8-22 mins  Lesle Chris, OTR/L Acute Rehabilitation Services [phone] [facility] pager [phone] office 06/20/2018  Lejuan Botto 06/20/2018, 3:31 PM

## 2018-06-21 DIAGNOSIS — G9341 Metabolic encephalopathy: Secondary | ICD-10-CM | POA: Diagnosis not present

## 2018-06-21 DIAGNOSIS — D649 Anemia, unspecified: Secondary | ICD-10-CM | POA: Diagnosis not present

## 2018-06-21 DIAGNOSIS — I5032 Chronic diastolic (congestive) heart failure: Secondary | ICD-10-CM | POA: Diagnosis not present

## 2018-06-21 DIAGNOSIS — I5042 Chronic combined systolic (congestive) and diastolic (congestive) heart failure: Secondary | ICD-10-CM | POA: Diagnosis not present

## 2018-06-21 DIAGNOSIS — R509 Fever, unspecified: Secondary | ICD-10-CM | POA: Diagnosis not present

## 2018-06-21 DIAGNOSIS — I959 Hypotension, unspecified: Secondary | ICD-10-CM | POA: Diagnosis not present

## 2018-06-21 DIAGNOSIS — N179 Acute kidney failure, unspecified: Secondary | ICD-10-CM | POA: Diagnosis not present

## 2018-06-21 DIAGNOSIS — I130 Hypertensive heart and chronic kidney disease with heart failure and stage 1 through stage 4 chronic kidney disease, or unspecified chronic kidney disease: Secondary | ICD-10-CM | POA: Diagnosis not present

## 2018-06-21 DIAGNOSIS — S22078A Other fracture of T9-T10 vertebra, initial encounter for closed fracture: Secondary | ICD-10-CM | POA: Diagnosis not present

## 2018-06-21 DIAGNOSIS — S42212A Unspecified displaced fracture of surgical neck of left humerus, initial encounter for closed fracture: Secondary | ICD-10-CM | POA: Diagnosis not present

## 2018-06-21 DIAGNOSIS — A419 Sepsis, unspecified organism: Secondary | ICD-10-CM | POA: Diagnosis not present

## 2018-06-21 DIAGNOSIS — M6282 Rhabdomyolysis: Secondary | ICD-10-CM | POA: Diagnosis not present

## 2018-06-21 DIAGNOSIS — K661 Hemoperitoneum: Secondary | ICD-10-CM | POA: Diagnosis not present

## 2018-06-21 LAB — CBC WITH DIFFERENTIAL/PLATELET
Abs Immature Granulocytes: 0.13 10*3/uL — ABNORMAL HIGH (ref 0.00–0.07)
BASOS ABS: 0 10*3/uL (ref 0.0–0.1)
Basophils Relative: 0 %
EOS ABS: 0.1 10*3/uL (ref 0.0–0.5)
Eosinophils Relative: 1 %
HCT: 29.8 % — ABNORMAL LOW (ref 36.0–46.0)
HEMOGLOBIN: 8.9 g/dL — AB (ref 12.0–15.0)
IMMATURE GRANULOCYTES: 2 %
LYMPHS ABS: 1.3 10*3/uL (ref 0.7–4.0)
LYMPHS PCT: 16 %
MCH: 25.5 pg — ABNORMAL LOW (ref 26.0–34.0)
MCHC: 29.9 g/dL — AB (ref 30.0–36.0)
MCV: 85.4 fL (ref 80.0–100.0)
Monocytes Absolute: 2.5 10*3/uL — ABNORMAL HIGH (ref 0.1–1.0)
Monocytes Relative: 30 %
NEUTROS ABS: 4.2 10*3/uL (ref 1.7–7.7)
NEUTROS PCT: 51 %
NRBC: 0 % (ref 0.0–0.2)
Platelets: 112 10*3/uL — ABNORMAL LOW (ref 150–400)
RBC: 3.49 MIL/uL — ABNORMAL LOW (ref 3.87–5.11)
RDW: 18.7 % — ABNORMAL HIGH (ref 11.5–15.5)
WBC: 8.2 10*3/uL (ref 4.0–10.5)

## 2018-06-21 LAB — MAGNESIUM: MAGNESIUM: 1.8 mg/dL (ref 1.7–2.4)

## 2018-06-21 LAB — BASIC METABOLIC PANEL
ANION GAP: 6 (ref 5–15)
BUN: 12 mg/dL (ref 8–23)
CHLORIDE: 110 mmol/L (ref 98–111)
CO2: 25 mmol/L (ref 22–32)
Calcium: 8.2 mg/dL — ABNORMAL LOW (ref 8.9–10.3)
Creatinine, Ser: 0.87 mg/dL (ref 0.44–1.00)
GFR calc Af Amer: >60 mL/min (ref >60)
GLUCOSE: 129 mg/dL — AB (ref 70–99)
POTASSIUM: 4.3 mmol/L (ref 3.5–5.1)
Sodium: 141 mmol/L (ref 135–145)

## 2018-06-21 LAB — CK: Total CK: 305 U/L — ABNORMAL HIGH (ref 38–234)

## 2018-06-21 MED ORDER — SODIUM CHLORIDE 0.9 % IV SOLN
125.0000 mg | Freq: Every day | INTRAVENOUS | Status: DC
  Administered 2018-06-21 – 2018-06-23 (×3): 125 mg via INTRAVENOUS
  Filled 2018-06-21 (×3): qty 10

## 2018-06-21 NOTE — Care Management Note (Signed)
Case Management Note  Patient Details  Name: TRECIA MARING MRN: [id number] Date of Birth: 1951/02/19  Subjective/Objective:  Fall, CHF, HTN, fever                   Action/Plan: NCM spoke to pt and gave permission to speak dtr, Phillis Knack # [phone]. Offered choice for [facility]. Dtr states pt had [facility] in the past. She has [place] Medicaid aide that comes a few times a week. She has RW, wheelchair, and bedside commode at home. She has Medicaid transportation. She and brother are able to assist with care. Contacted [facility] with new referral for [facility].   Expected Discharge Date:  (unknown)               Expected Discharge Plan:  [place]  In-House Referral:  NA  Discharge planning Services  CM Consult  Post Acute Care Choice:  Home Health Choice offered to:  Adult Children  DME Arranged:  N/A DME Agency:  NA  HH Arranged:  RN, PT HH Agency:  [facility]  Status of Service:  Completed, signed off  If discussed at [place] of Stay Meetings, dates discussed:    Additional Comments:  Erenest Rasher, RN 06/21/2018, 11:22 AM

## 2018-06-21 NOTE — Progress Notes (Signed)
Patient ID: [name], female   DOB: 03-11-1951, 67 y.o.   MRN: [id number]          [facility] for Infectious Disease    Date of Admission:  06/16/2018     She has been afebrile for the past 24 hours.  All cultures have been negative and she has no clinical evidence of active infection.  I will sign off now.         Michel Bickers, MD [facility] for Infectious [place] [facility] [phone] pager   [phone] cell 06/21/2018, 10:14 AM

## 2018-06-21 NOTE — Progress Notes (Signed)
Patient wishes to discharge home with home health.   CSW signing off, no social work needs at this time.  Stephanie Acre, LCSW-A Clinical Social Worker

## 2018-06-21 NOTE — Progress Notes (Signed)
[facility] ROUNDING NOTE   Subjective:   Much better this morning.  Still has pain which she describes as all over   Urine output 1.11 L blood pressure 137/55 pulse 79 temperature 99.2  Pressure 141 potassium 4.3 chloride 111 CO2 25 BUN 12 creatinine 0.8 glucose 129 CK 305 hemoglobin to 8.9 WBC 8.2.  Platelets 112    Objective:  Vital signs in last 24 hours:  Temp:  [98.9 F (37.2 C)-99.2 F (37.3 C)] 99.2 F (37.3 C) (11/03 0800) Pulse Rate:  [73-92] 86 (11/03 1000) Resp:  [17-39] 20 (11/03 1000) BP: (109-186)/(30-153) 186/65 (11/03 1000) SpO2:  [96 %-100 %] 98 % (11/03 1000)  Weight change:  Filed Weights   06/16/18 1308  Weight: 95.3 kg    Intake/Output: I/O last 3 completed shifts: In: 211.3 [I.V.:6; IV Piggyback:205.3] Out: 1660 [Urine:1660]   Intake/Output this shift:  No intake/output data recorded.  CVS- RRR no murmurs rubs or gallops RS- CTA diminished breath sounds at bases ABD- BS present soft non-distended EXT-obese no peripheral edema   Basic Metabolic Panel: Recent Labs  Lab 06/16/18 1921 06/17/18 0445 06/18/18 0320 06/19/18 0354 06/20/18 0653 06/20/18 1300 06/21/18 0352  NA 142  --  137 137  --  138 141  K 3.9  --  4.4 4.5  --  4.2 4.3  CL 109  --  107 109  --  108 110  CO2 20*  --  21* 19*  --  24 25  GLUCOSE 169*  --  130* 116*  --  148* 129*  BUN 8  --  19 26*  --  15 12  CREATININE 1.12* 1.35* 3.29* 2.21*  --  1.08* 0.87  CALCIUM 8.8*  --  7.5* 7.5*  --  8.1* 8.2*  MG  --   --  1.5*  --  1.9  --  1.8    Liver Function Tests: Recent Labs  Lab 06/16/18 1921 06/18/18 0320  AST 41 28  ALT 16 14  ALKPHOS 49 41  BILITOT 0.6 0.9  PROT 7.4 6.6  ALBUMIN 3.9 3.3*   Recent Labs  Lab 06/18/18 0320  LIPASE 35   No results for input(s): AMMONIA in the last 168 hours.  CBC: Recent Labs  Lab 06/16/18 1915  06/17/18 0931 06/18/18 0320 06/19/18 0354 06/20/18 0653 06/21/18 0352  WBC 12.9*   < > 10.2 9.6 9.3  9.2 8.2  NEUTROABS 9.1*  --   --  4.9 5.2 4.9 4.2  HGB 12.7   < > 10.3* 9.5* 9.2* 9.4* 8.9*  HCT 40.9   < > 35.0* 32.3* 32.0* 29.6* 29.8*  MCV 82.6   < > 87.3 87.8 88.2 80.9 85.4  PLT 147*   < > 107* 89* 81* 84* 112*   < > = values in this interval not displayed.    Cardiac Enzymes: Recent Labs  Lab 06/16/18 2115 06/18/18 0320 06/20/18 1300 06/21/18 0352  CKTOTAL  --  868* 452* 305*  TROPONINI 0.03*  --   --   --     BNP: Invalid input(s): POCBNP  CBG: No results for input(s): GLUCAP in the last 168 hours.  Microbiology: Results for orders placed or performed during the hospital encounter of 06/16/18  Blood Culture (routine x 2)     Status: None (Preliminary result)   Collection Time: 06/16/18  7:38 PM  Result Value Ref Range Status   Specimen Description   Final    BLOOD RIGHT ANTECUBITAL Performed  at [facility], [place] [address] [place], [place] [address]    Special Requests   Final    BOTTLES DRAWN AEROBIC AND ANAEROBIC Blood Culture adequate volume Performed at [place] [address] [place], [place] [address]    Culture   Final    NO GROWTH 3 DAYS Performed at [facility], [place] [address] [place], [place] [address]    Report Status PENDING  Incomplete  Blood Culture (routine x 2)     Status: None (Preliminary result)   Collection Time: 06/16/18  7:38 PM  Result Value Ref Range Status   Specimen Description   Final    BLOOD RIGHT FOREARM Performed at [facility] [address], [place], [place] [address]    Special Requests   Final    BOTTLES DRAWN AEROBIC ONLY Blood Culture adequate volume Performed at [place] [address], [place], [place] [address]    Culture   Final    NO GROWTH 3 DAYS Performed at [facility], [place] [address], [place], [place] [address]    Report Status PENDING  Incomplete  Culture, Urine     Status: None   Collection Time:  06/16/18  7:38 PM  Result Value Ref Range Status   Specimen Description   Final    URINE, RANDOM Performed at [place] [address] [place], [place] [address]    Special Requests   Final    NONE Performed at [facility], [place] [address], [place], [place] [address]    Culture   Final    NO GROWTH Performed at [facility], [place] [address], [place], [place] [address]    Report Status 06/19/2018 FINAL  Final  MRSA PCR Screening     Status: None   Collection Time: 06/17/18 12:07 PM  Result Value Ref Range Status   MRSA by PCR NEGATIVE NEGATIVE Final    Comment:        The GeneXpert MRSA Assay (FDA approved for NASAL specimens only), is one component of a comprehensive MRSA colonization surveillance program. It is not intended to diagnose MRSA infection nor to guide or monitor treatment for MRSA infections. Performed at [facility], [place] [address] [place], [place] [address]     Coagulation Studies: No results for input(s): LABPROT, INR in the last 72 hours.  Urinalysis: No results for input(s): COLORURINE, LABSPEC, PHURINE, GLUCOSEU, HGBUR, BILIRUBINUR, KETONESUR, PROTEINUR, UROBILINOGEN, NITRITE, LEUKOCYTESUR in the last 72 hours.  Invalid input(s): APPERANCEUR    Imaging: No results found.   Medications:   . sodium chloride Stopped (06/19/18 2316)   . bisacodyl  10 mg Rectal Daily  . citalopram  40 mg Oral Daily  . cloNIDine  0.1 mg Oral QHS  . gabapentin  300 mg Oral QHS  . labetalol  200 mg Oral BID  . pantoprazole  40 mg Oral Daily  . polyethylene glycol  17 g Oral Daily  . senna-docusate  1 tablet Oral BID  . simvastatin  20 mg Oral QPM   sodium chloride, albuterol, hydrALAZINE, loperamide, ondansetron (ZOFRAN) IV, oxyCODONE-acetaminophen, promethazine  Assessment/ Plan:   Acute kidney injury secondary to hypotension shock sepsis rhabdomyolysis with an increased CPK use of an ARB  as an outpatient and also vancomycin.  Appears to be a combination of septic and ischemic ATN urinalysis was fairly unremarkable except for some mild proteinuria CT scan revealed  no evidence of hydronephrosis.  Her blood pressure appears to have improved.  She appears to be recovering from her acute renal failure creatinine now at baseline will sign off  Hypertension/volume will discontinue metoprolol and start labetalol 200 mg twice daily for better blood pressure control   Anemia stable at this time without the use of ESA's iron sats 5%.  Will treat with IV iron  Retroperitoneal hemorrhage will need follow-up with CT as well as serial checks on hemoglobin.  Per primary.  Etiology appears to have been secondary to trauma.  From cytopenia platelets dropping to 81 do not see any heparin products being used  Sepsis appreciate assistance from Dr. Megan Salon have discontinued daptomycin and cefepime  Will sign off from patient today appears to be doing better and now returned to baseline Deficiency anemia we will treat with IV iron needs follow-up CT to assess retroperitoneal hemorrhage no nephrology follow-up needed   LOS: [place] @TODAY @12 :43 PM

## 2018-06-21 NOTE — Progress Notes (Signed)
PROGRESS NOTE  [name] WLS:[id number] DOB: 19-May-1951 DOA: 06/16/2018 PCP: [name], MD  Brief history:  Reports lives by herself, she reports tripped adn [place] facing down at home and lay on the floor for more than an hrs  Daughter reports patient has progressive weakness in the last few weeks She has fever of unclear source     HPI/Recap of past 24 hours:  tmax 99 last 24hrs off abx Appear to continue to improve, she is alert and talking , no confusion bp elevated, Foley in place with clear urine, 1.4liter urine output documented last 24hrs +bm  Assessment/Plan: Principal Problem:   Fever Active Problems:   Chronic congestive heart failure (HCC)   Hypertension   Hyperlipidemia   Sepsis (HCC)   Nausea vomiting and diarrhea   Back pain   Humerus fracture   Thrombocytopenia (HCC)   CKD (chronic kidney disease) stage 3, GFR 30-59 ml/min (HCC)   Depression   Physical deconditioning   Fracture of neck of left humerus, closed, initial encounter   Sepsis Ruled out -she has Fever 100.8,sinus tachycardia, heart rate 110-120's on presentation, wbc12.9, lactic acid 6.14 on presentation -UA with few bacteria, urine culture negative, blood culture negative, MRSA screening negative, CT spine imaging there is no concern of infection there -cxr with "Borderline stable cardiomegaly with suggestion of mild vascular congestion" -Patient reported having diarrhea at home, but no BM  since arrived to the hospital -she is started on broad spectrum abx,-Infectious disease consulted -abx d/ed on 11/1, patient has been afibrile off abx, infectious disease signed off.  AKI on CKDII -cr baseline 0.9 -cr jumped to 3.29 on second day of hospitalization -ct on presentation on obstruction,  -ua with few bacteria, urine culture no growth -bladder scan on 10/31 with 311cc urine,  foley inserted on 10/31 --worsening of cr likely multifactorial, she does has mild elevated ck, she  received vanc on admission, -nephrology consulted, cr back to baseline today, nephrology signed off    Acute minimally displaced fracture extending through the anterior-inferior aspect of the T10 vertebral body Admitting MD Dr Marlowe Sax discussed with neurosurgery PA on-call (Mr. Cato Mulligan) consulted who recommended "no surgical intervention is needed at this time.  Recommendation is to use TLSO brace and outpatient neurosurgery follow-up."  Minimally displaced L proximal humerus fracture  ortho Dr Griffin Basil consulted Per Dr Griffin Basil" NWB in sling x6 weeks, followup outpatient when discharged.  No PT or ROM of shoulder.  Sling at all times. OK to move hand and wrist while in sling. "  Acute retroperitoneum hemorrhage: Scattered soft tissue stranding within the retroperitoneum and pelvis, consistent with acute hemorrhage, overall small to moderate in volume.  Admitting MD Dr Marlowe Sax discussed with general surgery on call Dr Excell Seltzer who states "no surgical intervention is needed at this time." -blood pressure and hemoglobin stable, continue monitor  Acute Anemia/thrombocytopenia Likely secondary to retroperitoneum hemorrage plt seems had nadir and start to rise hgb continue to slowly dropping, low iron sats at 5%, nephrology prescribed iv iron on 11/3. bp stable, she denies ab pain or back pain Repeat  ct ab in am if hgb continue to drop  Acute metabolic Encephalopathy -She is drowsy slightly confused on presentation which is not her baseline -Stat CT scan no acute findings -Taper clonidine to off, have discussed with patient ,she agrees to -improved, now alert , awake , no confusion  Nausea and vomiting, chronic diarrhea Non observed since admission  ct ab no obstruction  History of chronic combined CHF Last EF in 2015 was 50% with diffuse hyper hypokinesis, and grade 1 diastolic dysfunction  Lasix held since admission due to drowsiness and poor oral intake -she is improving, likely  able to resume lasix prior to discharge -Close monitor volume status-Currently no edema  Hypertension  plan to  taper clonidine  To off Lopressor changed to labetalol for better bp control  on prn hydralzine home meds losartan HCTZ held since admission, plan to resume losartan at lower dose tomorrow if bp elevated Home meds  Lasix held since admission, plan to resume lasix prior to discharge  Body mass index is 37.2 kg/m.   FTT: Per daughter patient has been mostly in bed for 21-month due to multiple back issues.  She does walk to the car and go to church on the weekend PT OT eval Patient adamantly does not want to go to SNF   Code Status: full  Family Communication: patient daily and daughter at bedside on 10/30  Disposition Plan: improving, transfer out of stepdown to med surg, out of bed, need to do voiding trial, need to monitor hgb, need to collect fobt, continue adjust bp meds Likely able to d/c on 11/5   Consultants:  ID  nephrology  Procedures:  none  Antibiotics:  Vancx1 on 10/29 and x1 on 10/30 Cefepime from admission to 11/2 daptomycin from admission to 11/2  Objective: BP (!) 143/53   Pulse 81   Temp 98.6 F (37 C) (Oral)   Resp (!) 22   Ht 5\' 3"  (1.6 m)   Wt 95.3 kg   SpO2 98%   BMI 37.20 kg/m   Intake/Output Summary (Last 24 hours) at 06/21/2018 1350 Last data filed at 06/21/2018 0600 Gross per 24 hour  Intake -  Output 725 ml  Net -725 ml   Filed Weights   06/16/18 1308  Weight: 95.3 kg    Exam: Patient is examined daily including today on 06/21/2018, exams remain the same as of yesterday except that has changed    General:  Drowsiness has resolved, Fully alert today, oriented x3  Cardiovascular: RRR  Respiratory: CTABL  Abdomen: Soft/ND/NT, positive BS  Musculoskeletal: No Edema, legs able to move more  Neuro: oriented x3, fully alert  Data Reviewed: Basic Metabolic Panel: Recent Labs  Lab 06/16/18 1921 06/17/18 0445  06/18/18 0320 06/19/18 0354 06/20/18 0653 06/20/18 1300 06/21/18 0352  NA 142  --  137 137  --  138 141  K 3.9  --  4.4 4.5  --  4.2 4.3  CL 109  --  107 109  --  108 110  CO2 20*  --  21* 19*  --  24 25  GLUCOSE 169*  --  130* 116*  --  148* 129*  BUN 8  --  19 26*  --  15 12  CREATININE 1.12* 1.35* 3.29* 2.21*  --  1.08* 0.87  CALCIUM 8.8*  --  7.5* 7.5*  --  8.1* 8.2*  MG  --   --  1.5*  --  1.9  --  1.8   Liver Function Tests: Recent Labs  Lab 06/16/18 1921 06/18/18 0320  AST 41 28  ALT 16 14  ALKPHOS 49 41  BILITOT 0.6 0.9  PROT 7.4 6.6  ALBUMIN 3.9 3.3*   Recent Labs  Lab 06/18/18 0320  LIPASE 35   No results for input(s): AMMONIA in the last 168 hours. CBC: Recent Labs  Lab 06/16/18 1915  06/17/18 0931 06/18/18 0320 06/19/18 0354 06/20/18 0653 06/21/18 0352  WBC 12.9*   < > 10.2 9.6 9.3 9.2 8.2  NEUTROABS 9.1*  --   --  4.9 5.2 4.9 4.2  HGB 12.7   < > 10.3* 9.5* 9.2* 9.4* 8.9*  HCT 40.9   < > 35.0* 32.3* 32.0* 29.6* 29.8*  MCV 82.6   < > 87.3 87.8 88.2 80.9 85.4  PLT 147*   < > 107* 89* 81* 84* 112*   < > = values in this interval not displayed.   Cardiac Enzymes:   Recent Labs  Lab 06/16/18 2115 06/18/18 0320 06/20/18 1300 06/21/18 0352  CKTOTAL  --  868* 452* 305*  TROPONINI 0.03*  --   --   --    BNP (last 3 results) No results for input(s): BNP in the last 8760 hours.  ProBNP (last 3 results) No results for input(s): PROBNP in the last 8760 hours.  CBG: No results for input(s): GLUCAP in the last 168 hours.  Recent Results (from the past 240 hour(s))  Blood Culture (routine x 2)     Status: None (Preliminary result)   Collection Time: 06/16/18  7:38 PM  Result Value Ref Range Status   Specimen Description   Final    BLOOD RIGHT ANTECUBITAL Performed at [facility] [address], [place], [place] [address]    Special Requests   Final    BOTTLES DRAWN AEROBIC AND ANAEROBIC Blood Culture adequate  volume Performed at [facility] [address], [place], [place] [address]    Culture   Final    NO GROWTH 3 DAYS Performed at [facility], [place] [address], [place], [place] [address]    Report Status PENDING  Incomplete  Blood Culture (routine x 2)     Status: None (Preliminary result)   Collection Time: 06/16/18  7:38 PM  Result Value Ref Range Status   Specimen Description   Final    BLOOD RIGHT FOREARM Performed at [facility] [address], [place], [place] [address]    Special Requests   Final    BOTTLES DRAWN AEROBIC ONLY Blood Culture adequate volume Performed at [facility] [address] [place], [place] [address]    Culture   Final    NO GROWTH 3 DAYS Performed at [facility], [place] [address] [place], [place] [address]    Report Status PENDING  Incomplete  Culture, Urine     Status: None   Collection Time: 06/16/18  7:38 PM  Result Value Ref Range Status   Specimen Description   Final    URINE, RANDOM Performed at [facility] [address], [place], [place] [address]    Special Requests   Final    NONE Performed at [facility], [place] [address], [place],  [address]    Culture   Final    NO GROWTH Performed at [facility], [place] [address], [place],  [address]    Report Status 06/19/2018 FINAL  Final  MRSA PCR Screening     Status: None   Collection Time: 06/17/18 12:07 PM  Result Value Ref Range Status   MRSA by PCR NEGATIVE NEGATIVE Final    Comment:        The GeneXpert MRSA Assay (FDA approved for NASAL specimens only), is one component of a comprehensive MRSA colonization surveillance program. It is not intended  to diagnose MRSA infection nor to guide or monitor treatment for MRSA infections. Performed at [facility], [place] [address] [place], [place] [address]      Studies: No results  found.  Scheduled Meds: . bisacodyl  10 mg Rectal Daily  . citalopram  40 mg Oral Daily  . cloNIDine  0.1 mg Oral QHS  . gabapentin  300 mg Oral QHS  . labetalol  200 mg Oral BID  . pantoprazole  40 mg Oral Daily  . polyethylene glycol  17 g Oral Daily  . senna-docusate  1 tablet Oral BID  . simvastatin  20 mg Oral QPM    Continuous Infusions: . sodium chloride Stopped (06/19/18 2316)  . ferric gluconate (FERRLECIT/NULECIT) IV       Time spent: 73mins,  I have personally reviewed and interpreted on  06/21/2018 daily labs, tele strips, imagings as discussed above under date review session and assessment and plans.  I reviewed all nursing notes, pharmacy notes, consultant notes,  vitals, pertinent old records  I have discussed plan of care as described above with RN , patient  on 06/21/2018   Florencia Reasons MD, PhD  [facility] Pager [phone]. If 7PM-7AM, please contact night-coverage at [email or web address], password [facility] 06/21/2018, 1:50 PM  LOS: 5 days

## 2018-06-22 DIAGNOSIS — S42212A Unspecified displaced fracture of surgical neck of left humerus, initial encounter for closed fracture: Secondary | ICD-10-CM | POA: Diagnosis not present

## 2018-06-22 DIAGNOSIS — S22078A Other fracture of T9-T10 vertebra, initial encounter for closed fracture: Secondary | ICD-10-CM | POA: Diagnosis not present

## 2018-06-22 DIAGNOSIS — I5042 Chronic combined systolic (congestive) and diastolic (congestive) heart failure: Secondary | ICD-10-CM | POA: Diagnosis not present

## 2018-06-22 DIAGNOSIS — N179 Acute kidney failure, unspecified: Secondary | ICD-10-CM | POA: Diagnosis not present

## 2018-06-22 DIAGNOSIS — K661 Hemoperitoneum: Secondary | ICD-10-CM | POA: Diagnosis not present

## 2018-06-22 DIAGNOSIS — G9341 Metabolic encephalopathy: Secondary | ICD-10-CM | POA: Diagnosis not present

## 2018-06-22 DIAGNOSIS — I5032 Chronic diastolic (congestive) heart failure: Secondary | ICD-10-CM | POA: Diagnosis not present

## 2018-06-22 DIAGNOSIS — M6282 Rhabdomyolysis: Secondary | ICD-10-CM | POA: Diagnosis not present

## 2018-06-22 DIAGNOSIS — I130 Hypertensive heart and chronic kidney disease with heart failure and stage 1 through stage 4 chronic kidney disease, or unspecified chronic kidney disease: Secondary | ICD-10-CM | POA: Diagnosis not present

## 2018-06-22 DIAGNOSIS — R509 Fever, unspecified: Secondary | ICD-10-CM | POA: Diagnosis not present

## 2018-06-22 LAB — CBC WITH DIFFERENTIAL/PLATELET
ABS IMMATURE GRANULOCYTES: 0.08 10*3/uL — AB (ref 0.00–0.07)
BASOS PCT: 0 %
Basophils Absolute: 0 10*3/uL (ref 0.0–0.1)
Eosinophils Absolute: 0.1 10*3/uL (ref 0.0–0.5)
Eosinophils Relative: 2 %
HCT: 30.4 % — ABNORMAL LOW (ref 36.0–46.0)
Hemoglobin: 9.2 g/dL — ABNORMAL LOW (ref 12.0–15.0)
Immature Granulocytes: 1 %
Lymphocytes Relative: 19 %
Lymphs Abs: 1.3 10*3/uL (ref 0.7–4.0)
MCH: 25.6 pg — AB (ref 26.0–34.0)
MCHC: 30.3 g/dL (ref 30.0–36.0)
MCV: 84.4 fL (ref 80.0–100.0)
Monocytes Absolute: 1.9 10*3/uL — ABNORMAL HIGH (ref 0.1–1.0)
Monocytes Relative: 29 %
NEUTROS ABS: 3.2 10*3/uL (ref 1.7–7.7)
NRBC: 0.6 % — AB (ref 0.0–0.2)
Neutrophils Relative %: 49 %
PLATELETS: 150 10*3/uL (ref 150–400)
RBC: 3.6 MIL/uL — AB (ref 3.87–5.11)
RDW: 19.2 % — ABNORMAL HIGH (ref 11.5–15.5)
WBC: 6.6 10*3/uL (ref 4.0–10.5)

## 2018-06-22 LAB — BASIC METABOLIC PANEL
Anion gap: 7 (ref 5–15)
BUN: 11 mg/dL (ref 8–23)
CO2: 27 mmol/L (ref 22–32)
CREATININE: 0.83 mg/dL (ref 0.44–1.00)
Calcium: 8.6 mg/dL — ABNORMAL LOW (ref 8.9–10.3)
Chloride: 107 mmol/L (ref 98–111)
GFR calc Af Amer: >60 mL/min (ref >60)
GFR calc non Af Amer: >60 mL/min (ref >60)
Glucose, Bld: 133 mg/dL — ABNORMAL HIGH (ref 70–99)
POTASSIUM: 4.7 mmol/L (ref 3.5–5.1)
SODIUM: 141 mmol/L (ref 135–145)

## 2018-06-22 LAB — MAGNESIUM: Magnesium: 1.7 mg/dL (ref 1.7–2.4)

## 2018-06-22 LAB — PROTIME-INR
INR: 0.96
Prothrombin Time: 12.6 seconds (ref 11.4–15.2)

## 2018-06-22 MED ORDER — MINERAL OIL RE ENEM
1.0000 | ENEMA | Freq: Once | RECTAL | Status: AC
  Administered 2018-06-22: 1 via RECTAL
  Filled 2018-06-22: qty 1

## 2018-06-22 MED ORDER — POLYETHYLENE GLYCOL 3350 17 G PO PACK
17.0000 g | PACK | Freq: Two times a day (BID) | ORAL | Status: DC
  Administered 2018-06-22 – 2018-06-23 (×3): 17 g via ORAL
  Filled 2018-06-22 (×2): qty 1

## 2018-06-22 MED ORDER — MAGNESIUM SULFATE 2 GM/50ML IV SOLN
2.0000 g | Freq: Once | INTRAVENOUS | Status: AC
  Administered 2018-06-22: 2 g via INTRAVENOUS
  Filled 2018-06-22: qty 50

## 2018-06-22 MED ORDER — FUROSEMIDE 10 MG/ML IJ SOLN
40.0000 mg | Freq: Once | INTRAMUSCULAR | Status: AC
  Administered 2018-06-22: 40 mg via INTRAVENOUS
  Filled 2018-06-22: qty 4

## 2018-06-22 MED ORDER — CITALOPRAM HYDROBROMIDE 20 MG PO TABS
20.0000 mg | ORAL_TABLET | Freq: Every day | ORAL | Status: DC
  Administered 2018-06-23: 20 mg via ORAL
  Filled 2018-06-22: qty 1

## 2018-06-22 NOTE — Care Management Important Message (Signed)
Important Message  Patient Details  Name: [name] MRN: [id number] Date of Birth: August 26, 1950   Medicare Important Message Given:  Yes    Kerin Salen 06/22/2018, 11:52 AMImportant Message  Patient Details  Name: [name] MRN: [id number] Date of Birth: 1951/06/09   Medicare Important Message Given:  Yes    Kerin Salen 06/22/2018, 11:52 AMImportant Message  Patient Details  Name: [name] MRN: [id number] Date of Birth: Dec 26, 1950   Medicare Important Message Given:  Yes    Kerin Salen 06/22/2018, 11:51 AM

## 2018-06-22 NOTE — Progress Notes (Signed)
Occupational Therapy Treatment Patient Details Name: [name] MRN: [id number] DOB: October 13, 1950 Today's Date: 06/22/2018    History of present illness 67 yo admitted 06/16/18 after a fall.  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 Admitted  to the hospital with hypertensive and tachycardic with a temperature of 100.8 lactic acid level 6.14.   X-ray evaluation showed an acute fracture of the left proximal humerus, minimally displaced T 10 fracture.CT scan that was consistent with acute retroperitoneal hemorrhage.   OT comments  Pt needs post acute rehab but refuses  Follow Up Recommendations  SNF;Home health OT;Supervision/Assistance - 24 hour(pt is refusing; will need 2 caregivers simulatenously at hom)    [facility] bed;3 in 1 bedside commode(wide [employer])    Recommendations for Other Services      Precautions / Restrictions Precautions Precaution Comments: per Ortho, TLSO when OOB, not designated  when to palce  brace. Required Braces or Orthoses: Spinal Brace;Sling Spinal Brace: Thoracolumbosacral orthotic Restrictions LUE Weight Bearing: Non weight bearing       Mobility Bed Mobility Overal bed mobility: Needs Assistance Bed Mobility: Supine to Sit;Sit to Supine     Supine to sit: Total assist;+2 for physical assistance;+2 for safety/equipment Sit to supine: Total assist;+2 for physical assistance;+2 for safety/equipment   General bed mobility comments: total A to roll  to midline from right partial side lying, Patient is unable to tolerate roll to left. pLACED IN A PARTIAL BED CHAIR POSITION TO EAT BREAKKFAST.   Transfers Overall transfer level: Needs assistance Equipment used: 1 person hand held assist Transfers: Sit to/from Stand Sit to Stand: Mod assist;Max assist         General transfer comment: Vc for hand placement     Balance Overall balance assessment: Needs assistance;History of Falls Sitting-balance support: Feet supported;Single extremity supported Sitting balance-Leahy Scale: Poor                                     ADL either performed or assessed with clinical judgement   ADL Overall ADL's : Needs assistance/impaired Eating/Feeding: Minimal assistance;Sitting   Grooming: Minimal assistance;Moderate assistance;Sitting   Upper Body Bathing: Maximal assistance;Sitting   Lower Body Bathing: Sit to/from stand;+2 for safety/equipment;+2 for physical assistance;Maximal assistance   Upper Body Dressing : Moderate assistance;Sitting   Lower Body Dressing: +2 for physical assistance;Maximal assistance;Sit to/from stand;Cueing for sequencing;Cueing for safety;+2 for safety/equipment                 General ADL Comments: pt assures OT and PT she has adequate A at home.  Pt will need significant A with ADL activity. Sling soaked in sweat. Called for a new one.  Also called for a wide BSC.  Pt will need a BSC for home     Vision Patient Visual Report: No change from baseline            Cognition Arousal/Alertness: Awake/alert Behavior During Therapy: WFL for tasks assessed/performed Overall Cognitive Status: Within Functional Limits for tasks assessed                                 General Comments: patient reports that she does not recall PT session on theursday. she sat upright in bed.  Exercises             Pertinent Vitals/ Pain       Faces Pain Scale: Hurts whole lot Pain Location: back and left arm when repositioned Pain Intervention(s): Limited activity within patient's tolerance;Premedicated before session  Home Living                                              Frequency  Min 2X/week        Progress Toward Goals  OT Goals(current goals can now be found in the care plan section)  Progress towards OT  goals: Progressing toward goals     Plan Discharge plan remains appropriate;Discharge plan needs to be updated(pt needs rehab but refuses)       AM-PAC PT "6 Clicks" Daily Activity     Outcome Measure   Help from another person eating meals?: A Little Help from another person taking care of personal grooming?: A Lot Help from another person toileting, which includes using toliet, bedpan, or urinal?: Total Help from another person bathing (including washing, rinsing, drying)?: A Lot Help from another person to put on and taking off regular upper body clothing?: A Lot Help from another person to put on and taking off regular lower body clothing?: Total 6 Click Score: 11    End of Session    OT Visit Diagnosis: Muscle weakness (generalized) (M62.81);Pain;History of falling (Z91.81) Pain - Right/Left: Left Pain - part of body: Shoulder   Activity Tolerance Patient tolerated treatment well   Patient Left in bed;with call bell/phone within reach   Nurse Communication Mobility status        Time: 8315-1761 OT Time Calculation (min): 35 min  Charges: OT General Charges $OT Visit: 1 Visit OT Treatments $Self Care/Home Management : 8-22 mins  Kari Baars, [place] [phone] Office- [phone]      Crenshaw, Edwena Felty D 06/22/2018, 2:06 PM

## 2018-06-22 NOTE — Progress Notes (Signed)
BP= 183/81. Hydralazine IV PRN was given as MD ordered. RN will recheck BP in 30 minutes.

## 2018-06-22 NOTE — Progress Notes (Signed)
Physical Therapy Treatment Patient Details Name: [name] MRN: [id number] DOB: 1951-03-27 Today's Date: 06/22/2018    History of Present Illness 67 yo admitted 06/16/18 after a fall.  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 Admitted  to the hospital with hypertensive and tachycardic with a temperature of 100.8 lactic acid level 6.14.   X-ray evaluation showed an acute fracture of the left proximal humerus, minimally displaced T 10 fracture.CT scan that was consistent with acute retroperitoneal hemorrhage.    PT Comments    The patient  Tolerated mobilizing to sitting and stood with 2 assisting while holding onto back of chair and took small side steps. Patient did tolerate mobility much better. Patient adamnt about returning home. No family present to confirm caregiver availability. Patient reports having an aide that can increase hours per patient. Recommend SNf or CIR consult.   Placing TLSO is very difficult due to pain of the Left UE and unable to tolerate donning TLSO under the arm.   Follow Up Recommendations  SNF;CIR     Equipment Recommendations  None recommended by PT    Recommendations for Other Services Rehab consult     Precautions / Restrictions Precautions Precaution Comments: per Ortho, TLSO when OOB, not designated  when to palce  brace. Required Braces or Orthoses: Spinal Brace;Sling Spinal Brace: Thoracolumbosacral orthotic Restrictions Weight Bearing Restrictions: Yes LUE Weight Bearing: Non weight bearing    Mobility  Bed Mobility Overal bed mobility: Needs Assistance Bed Mobility: Supine to Sit;Sit to Supine     Supine to sit: Total assist;+2 for physical assistance;+2 for safety/equipment Sit to supine: Total assist;+2 for physical assistance;+2 for safety/equipment   extra time for  Legs, assist with trunk to sitting and then legs and trunk  to supine   Transfers Overall transfer level: Needs assistance Equipment used: 1 person hand held assist(held onto back of chair) Transfers: Sit to/from Stand Sit to Stand: Mod assist         General transfer comment: Vc for hand placement, held onto chair back with right UE, able to side step x 4 along the bed.   Ambulation/Gait             General Gait Details: nt   Stairs             Wheelchair Mobility    Modified Rankin (Stroke Patients Only)       Balance Overall balance assessment: Needs assistance;History of Falls Sitting-balance support: Feet supported;Single extremity supported Sitting balance-Leahy Scale: Fair Sitting balance - Comments: sat at mid line with RUE support   Standing balance support: Single extremity supported;During functional activity Standing balance-Leahy Scale: Fair Standing balance comment: stood at midline x 1 minute                            Cognition Arousal/Alertness: Awake/alert Behavior During Therapy: WFL for tasks assessed/performed Overall Cognitive Status: Within Functional Limits for tasks assessed                                 General Comments: patient reports that she does not recall PT session on theursday. she sat upright in bed.      Exercises      General Comments        Pertinent Vitals/Pain Faces Pain Scale:  Hurts whole lot Pain Location: back and left arm when repositioned Pain Descriptors / Indicators: Aching;Sharp Pain Intervention(s): Premedicated before session;Monitored during session;Repositioned;Limited activity within patient's tolerance    Home Living                      Prior Function            PT Goals (current goals can now be found in the care plan section) Progress towards PT goals: Progressing toward goals    Frequency    Min 2X/week      PT Plan Discharge plan needs to be updated;Current plan remains appropriate     Co-evaluation PT/OT/SLP Co-Evaluation/Treatment: Yes Reason for Co-Treatment: Complexity of the patient's impairments (multi-system involvement);For patient/therapist safety PT goals addressed during session: Mobility/safety with mobility OT goals addressed during session: ADL's and self-care      AM-PAC PT "6 Clicks" Daily Activity  Outcome Measure  Difficulty turning over in bed (including adjusting bedclothes, sheets and blankets)?: Unable Difficulty moving from lying on back to sitting on the side of the bed? : Unable Difficulty sitting down on and standing up from a chair with arms (e.g., wheelchair, bedside commode, etc,.)?: Unable Help needed moving to and from a bed to chair (including a wheelchair)?: Total Help needed walking in hospital room?: Total Help needed climbing 3-5 steps with a railing? : Total 6 Click Score: 6    End of Session   Activity Tolerance: Patient limited by fatigue Patient left: in bed;with call bell/phone within reach Nurse Communication: Mobility status PT Visit Diagnosis: Unsteadiness on feet (R26.81)     Time: 1200-1240 PT Time Calculation (min) (ACUTE ONLY): 40 min  Charges:  $Therapeutic Activity: 23-37 mins                     {Erielle Gawronski PT Acute Rehabilitation Services Pager [phone] Office [phone]    Claretha Cooper 06/22/2018, 2:40 PM

## 2018-06-22 NOTE — Progress Notes (Signed)
   06/22/18 1150  PT Visit Information  Assistance Needed +3 or more  History of Present Illness 67 yo admitted 06/16/18 after a fall.  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 Admitted  to the hospital with hypertensive and tachycardic with a temperature of 100.8 lactic acid level 6.14.   X-ray evaluation showed an acute fracture of the left proximal humerus, minimally displaced T 10 fracture.CT scan that was consistent with acute retroperitoneal hemorrhage.  Precautions  Precaution Comments per Ortho, TLSO when OOB, not designated  when to palce  brace.  Required Braces or Orthoses Spinal Brace;Sling  Spinal Brace TLSO  Restrictions  LUE Weight Bearing NWB  Pain Assessment  Faces Pain Scale 8  Pain Location back and left arm when repositioned  Cognition  Arousal/Alertness Awake/alert  Behavior During Therapy [facility] for tasks assessed/performed  General Comments patient reports that she does not recall PT session on theursday. she sat upright in bed.  Bed Mobility  Overal bed mobility Needs Assistance  Bed Mobility Supine to Sit  General bed mobility comments total A to roll  to midline from right partial side lying, Patient is unable to tolerate roll to left. pLACED IN A PARTIAL BED CHAIR POSITION TO EAT BREAKKFAST.   Transfers  General transfer comment unable  PT - End of Session  Activity Tolerance Patient limited by pain  Patient left in bed;with call bell/phone within reach;with bed alarm set;with family/visitor present  Nurse Communication Mobility status;Need for lift equipment   PT - Assessment/Plan  PT Visit Diagnosis Unsteadiness on feet (R26.81)  PT Frequency (ACUTE ONLY) Min 2X/week  Recommendations for Other Services OT consult  Follow Up Recommendations SNF  PT equipment None recommended by PT  AM-PAC PT "6 Clicks" Daily Activity Outcome Measure  Difficulty turning  over in bed (including adjusting bedclothes, sheets and blankets)? 1  Difficulty moving from lying on back to sitting on the side of the bed?  1  Difficulty sitting down on and standing up from a chair with arms (e.g., wheelchair, bedside commode, etc,.)? 1  Help needed moving to and from a bed to chair (including a wheelchair)? 1  Help needed walking in hospital room? 1  Help needed climbing 3-5 steps with a railing?  1  6 Click Score 6  Mobility G Code  CN  PT Goal Progression  Progress towards PT goals Not progressing toward goals - comment (PATIENT HAS MULTIPLE PROBLEMS AND UNABLE TO HELP SELF )  PT Time Calculation  PT Start Time (ACUTE ONLY) 0929  PT Stop Time (ACUTE ONLY) 1914  PT Time Calculation (min) (ACUTE ONLY) 8 min  PT General Charges  $$ ACUTE PT VISIT 1 Visit  Tresa Endo PT Acute Rehabilitation Services Pager [phone] Office [phone]

## 2018-06-22 NOTE — Progress Notes (Signed)
PROGRESS NOTE  Candace DEHAAS ONG:[id number] DOB: 08/14/1951 DOA: 06/16/2018 PCP: [name], MD  Brief history:  Reports lives by herself, she reports tripped adn [place] facing down at home and lay on the floor for more than an hrs  Daughter reports patient has progressive weakness in the last few weeks She has fever of unclear source     HPI/Recap of past 24 hours:   Appear to continue to improve, she is alert and talking , no confusion bp elevated, Foley in place with clear urine,  She reports she just has smear of bm the day before, has not really had bm since Thursday prior to coming to the hospital She wants an enema   Family at bedside  Assessment/Plan: Principal Problem:   Fever Active Problems:   Chronic congestive heart failure (HCC)   Hypertension   Hyperlipidemia   Sepsis (HCC)   Nausea vomiting and diarrhea   Back pain   Humerus fracture   Thrombocytopenia (HCC)   CKD (chronic kidney disease) stage 3, GFR 30-59 ml/min (HCC)   Depression   Physical deconditioning   Fracture of neck of left humerus, closed, initial encounter   Sepsis Ruled out -she has Fever 100.8,sinus tachycardia, heart rate 110-120's on presentation, wbc12.9, lactic acid 6.14 on presentation -UA with few bacteria, urine culture negative, blood culture negative, MRSA screening negative, CT spine imaging there is no concern of infection there -cxr with "Borderline stable cardiomegaly with suggestion of mild vascular congestion" -Patient reported having diarrhea at home, but no BM  since arrived to the hospital -she is started on broad spectrum abx,-Infectious disease consulted -abx d/ed on 11/1, patient has been afibrile off abx, infectious disease signed off.  AKI on CKDII -cr baseline 0.9 -cr jumped to 3.29 on second day of hospitalization -ct on presentation on obstruction,  -ua with few bacteria, urine culture no growth -bladder scan on 10/31 with 311cc urine,  foley  inserted for urinary retention  on 10/31 --worsening of cr likely multifactorial, she does has mild elevated ck, she received vanc on admission, -nephrology consulted, cr back to baseline today, nephrology signed off    Acute minimally displaced fracture extending through the anterior-inferior aspect of the T10 vertebral body Admitting MD Dr Marlowe Sax discussed with neurosurgery PA on-call (Mr. Cato Mulligan) consulted who recommended "no surgical intervention is needed at this time.  Recommendation is to use TLSO brace and outpatient neurosurgery follow-up."  Minimally displaced L proximal humerus fracture  ortho Dr Griffin Basil consulted Per Dr Griffin Basil" NWB in sling x6 weeks, followup outpatient when discharged.  No PT or ROM of shoulder.  Sling at all times. OK to move hand and wrist while in sling. "  Acute retroperitoneum hemorrhage: Scattered soft tissue stranding within the retroperitoneum and pelvis, consistent with acute hemorrhage, overall small to moderate in volume.  Admitting MD Dr Marlowe Sax discussed with general surgery on call Dr Excell Seltzer who states "no surgical intervention is needed at this time." -blood pressure and hemoglobin stable, continue monitor  Acute Anemia/thrombocytopenia Likely secondary to retroperitoneum hemorrage plt  [place] to 81, has normalized  hgb nadir to 8.9, improving, she did not need blood transfusion. low iron sats at 5%, nephrology prescribed iv iron on 11/3. bp stable, she denies ab pain or back pain Do not need to repeat ct ab  Now that hgb abd plt has improved, bp stable, patient denies ab pain.  She is to follow up with pcp.  Acute metabolic Encephalopathy -She is drowsy slightly  confused on presentation which is not her baseline -Stat CT head no acute findings -Taper clonidine to off, have discussed with patient ,she agrees to -improved, now alert , awake , no confusion  Nausea and vomiting, chronic diarrhea Non observed since admission  ct ab no  obstruction  She has constipation in the hospital, needs stool softener   History of chronic combined CHF Last EF in 2015 was 50% with diffuse hyper hypokinesis, and grade 1 diastolic dysfunction  Lasix held since admission due to drowsiness and poor oral intake -she is improving, resume lasix on 11/4 -Close monitor volume status-Currently no edema  Hypertension  plan to  taper clonidine  To off Lopressor changed to labetalol for better bp control  on prn hydralzine home meds losartan HCTZ held since admission, plan to resume losartan at lower dose tomorrow if bp elevated Home meds  Lasix held since admission,  resume lasix on 11/4  Body mass index is 37.2 kg/m.   FTT:  Per daughter patient has been mostly in bed for 59-month due to multiple back issues.  She does walk to the car and go to church on the weekend Per PT OT eval, patient need two person assist 24/7 for now Patient adamantly declined SNF placement Arrange home health   Code Status: full  Family Communication: patient daily and daughter at bedside on 10/30 and on 11/4  Disposition Plan: need to do voiding trial, Likely able to d/c on 11/5   Consultants:  ID  nephrology  Procedures:  none  Antibiotics:  Vancx1 on 10/29 and x1 on 10/30 Cefepime from admission to 11/2 daptomycin from admission to 11/2  Objective: BP (!) 156/72 Comment: nurse notified  Pulse 82   Temp 98.6 F (37 C) (Oral)   Resp 20   Ht 5\' 3"  (1.6 m)   Wt 95.3 kg   SpO2 97%   BMI 37.20 kg/m   Intake/Output Summary (Last 24 hours) at 06/22/2018 8341 Last data filed at 06/22/2018 9622 Gross per 24 hour  Intake 70 ml  Output 900 ml  Net -830 ml   Filed Weights   06/16/18 1308  Weight: 95.3 kg    Exam: Patient is examined daily including today on 06/22/2018, exams remain the same as of yesterday except that has changed    General:  Drowsiness has resolved, Fully alert today, oriented x3  Cardiovascular:  RRR  Respiratory: CTABL  Abdomen: Soft/ND/NT, positive BS  Musculoskeletal: No Edema, legs able to move more  Neuro: oriented x3, fully alert  Data Reviewed: Basic Metabolic Panel: Recent Labs  Lab 06/16/18 1921 06/17/18 0445 06/18/18 0320 06/19/18 0354 06/20/18 0653 06/20/18 1300 06/21/18 0352  NA 142  --  137 137  --  138 141  K 3.9  --  4.4 4.5  --  4.2 4.3  CL 109  --  107 109  --  108 110  CO2 20*  --  21* 19*  --  24 25  GLUCOSE 169*  --  130* 116*  --  148* 129*  BUN 8  --  19 26*  --  15 12  CREATININE 1.12* 1.35* 3.29* 2.21*  --  1.08* 0.87  CALCIUM 8.8*  --  7.5* 7.5*  --  8.1* 8.2*  MG  --   --  1.5*  --  1.9  --  1.8   Liver Function Tests: Recent Labs  Lab 06/16/18 1921 06/18/18 0320  AST 41 28  ALT 16 14  ALKPHOS  49 41  BILITOT 0.6 0.9  PROT 7.4 6.6  ALBUMIN 3.9 3.3*   Recent Labs  Lab 06/18/18 0320  LIPASE 35   No results for input(s): AMMONIA in the last 168 hours. CBC: Recent Labs  Lab 06/16/18 1915  06/17/18 0931 06/18/18 0320 06/19/18 0354 06/20/18 0653 06/21/18 0352  WBC 12.9*   < > 10.2 9.6 9.3 9.2 8.2  NEUTROABS 9.1*  --   --  4.9 5.2 4.9 4.2  HGB 12.7   < > 10.3* 9.5* 9.2* 9.4* 8.9*  HCT 40.9   < > 35.0* 32.3* 32.0* 29.6* 29.8*  MCV 82.6   < > 87.3 87.8 88.2 80.9 85.4  PLT 147*   < > 107* 89* 81* 84* 112*   < > = values in this interval not displayed.   Cardiac Enzymes:   Recent Labs  Lab 06/16/18 2115 06/18/18 0320 06/20/18 1300 06/21/18 0352  CKTOTAL  --  868* 452* 305*  TROPONINI 0.03*  --   --   --    BNP (last 3 results) No results for input(s): BNP in the last 8760 hours.  ProBNP (last 3 results) No results for input(s): PROBNP in the last 8760 hours.  CBG: No results for input(s): GLUCAP in the last 168 hours.  Recent Results (from the past 240 hour(s))  Blood Culture (routine x 2)     Status: None (Preliminary result)   Collection Time: 06/16/18  7:38 PM  Result Value Ref Range Status   Specimen  Description   Final    BLOOD RIGHT ANTECUBITAL Performed at [facility] [address] [place], [place] [address]    Special Requests   Final    BOTTLES DRAWN AEROBIC AND ANAEROBIC Blood Culture adequate volume Performed at [facility] [address], [place], [place] [address]    Culture   Final    NO GROWTH 4 DAYS Performed at [facility], [place] [address] [place], [place] [address]    Report Status PENDING  Incomplete  Blood Culture (routine x 2)     Status: None (Preliminary result)   Collection Time: 06/16/18  7:38 PM  Result Value Ref Range Status   Specimen Description   Final    BLOOD RIGHT FOREARM Performed at [facility] [address], [place], [place] [address]    Special Requests   Final    BOTTLES DRAWN AEROBIC ONLY Blood Culture adequate volume Performed at [facility] [address], [place], [place] [address]    Culture   Final    NO GROWTH 4 DAYS Performed at [facility], [place] [address], [place], [place] [address]    Report Status PENDING  Incomplete  Culture, Urine     Status: None   Collection Time: 06/16/18  7:38 PM  Result Value Ref Range Status   Specimen Description   Final    URINE, RANDOM Performed at [facility] [address] [place], [place] [address]    Special Requests   Final    NONE Performed at [facility], [place] [address] [place], [place] [address]    Culture   Final    NO GROWTH Performed at [facility], [place] [address], [place],  [address]    Report Status 06/19/2018 FINAL  Final  MRSA PCR Screening     Status: None   Collection Time: 06/17/18 12:07 PM  Result Value Ref  Range Status   MRSA by PCR NEGATIVE NEGATIVE Final    Comment:        The GeneXpert MRSA Assay (FDA approved for NASAL specimens only), is one component of a comprehensive MRSA colonization surveillance  program. It is not intended to diagnose MRSA infection nor to guide or monitor treatment for MRSA infections. Performed at [facility], [place] [address] [place], [place] [address]      Studies: No results found.  Scheduled Meds: . bisacodyl  10 mg Rectal Daily  . citalopram  40 mg Oral Daily  . gabapentin  300 mg Oral QHS  . labetalol  200 mg Oral BID  . mineral oil  1 enema Rectal Once  . pantoprazole  40 mg Oral Daily  . polyethylene glycol  17 g Oral Daily  . senna-docusate  1 tablet Oral BID  . simvastatin  20 mg Oral QPM    Continuous Infusions: . sodium chloride Stopped (06/19/18 2316)  . ferric gluconate (FERRLECIT/NULECIT) IV Stopped (06/21/18 1900)     Time spent: 63mins,  I have personally reviewed and interpreted on  06/22/2018 daily labs, imagings as discussed above under date review session and assessment and plans.  I reviewed all nursing notes, pharmacy notes, consultant notes,  vitals, pertinent old records  I have discussed plan of care as described above with RN , patient  on 06/22/2018   Florencia Reasons MD, PhD  [facility] Pager [phone]. If 7PM-7AM, please contact night-coverage at [email or web address], password [facility] 06/22/2018, 9:28 AM  LOS: 6 days

## 2018-06-22 NOTE — Care Management Note (Signed)
Case Management Note  Patient Details  Name: MAELI SPACEK MRN: [id number] Date of Birth: September 09, 1950  Subjective/Objective:  Spoke to patient again per attending request-patient adamantly states I do not want to stay in a SNF, I have gone to one before. Explained that [facility] is very different from SNF-limited care @ home HHC-come & go,teach a skill,intermittent visits. SNF stay for 30days receive rehab,medical care,meds. [facility] chosen for HHC-HHRN/PT/CSW, also home rw needed.Has caregiver services already set up. Patient will transport by [facility]-confirmed address.                 Action/Plan:dc home w/HHC/PTAR/dme   Expected Discharge Date:  (unknown)               Expected Discharge Plan:  [place]  In-House Referral:  NA  Discharge planning Services  CM Consult  Post Acute Care Choice:  Home Health Choice offered to:  Adult Children  DME Arranged:  N/A DME Agency:  NA  HH Arranged:  RN, PT, Social Work, Refused SNF [place] Agency:  [facility]  Status of Service:  Completed, signed off  If discussed at [employer], dates discussed:    Additional Comments:  Dessa Phi, RN 06/22/2018, 1:08 PM

## 2018-06-23 DIAGNOSIS — Z7401 Bed confinement status: Secondary | ICD-10-CM | POA: Diagnosis not present

## 2018-06-23 DIAGNOSIS — G9341 Metabolic encephalopathy: Secondary | ICD-10-CM | POA: Diagnosis not present

## 2018-06-23 DIAGNOSIS — I130 Hypertensive heart and chronic kidney disease with heart failure and stage 1 through stage 4 chronic kidney disease, or unspecified chronic kidney disease: Secondary | ICD-10-CM | POA: Diagnosis not present

## 2018-06-23 DIAGNOSIS — N183 Chronic kidney disease, stage 3 (moderate): Secondary | ICD-10-CM | POA: Diagnosis not present

## 2018-06-23 DIAGNOSIS — I10 Essential (primary) hypertension: Secondary | ICD-10-CM | POA: Diagnosis not present

## 2018-06-23 DIAGNOSIS — W19XXXA Unspecified fall, initial encounter: Secondary | ICD-10-CM | POA: Diagnosis not present

## 2018-06-23 DIAGNOSIS — M2550 Pain in unspecified joint: Secondary | ICD-10-CM | POA: Diagnosis not present

## 2018-06-23 DIAGNOSIS — D696 Thrombocytopenia, unspecified: Secondary | ICD-10-CM | POA: Diagnosis not present

## 2018-06-23 DIAGNOSIS — S22078A Other fracture of T9-T10 vertebra, initial encounter for closed fracture: Secondary | ICD-10-CM | POA: Diagnosis not present

## 2018-06-23 DIAGNOSIS — R509 Fever, unspecified: Secondary | ICD-10-CM | POA: Diagnosis not present

## 2018-06-23 DIAGNOSIS — S42212A Unspecified displaced fracture of surgical neck of left humerus, initial encounter for closed fracture: Secondary | ICD-10-CM | POA: Diagnosis not present

## 2018-06-23 DIAGNOSIS — N179 Acute kidney failure, unspecified: Secondary | ICD-10-CM | POA: Diagnosis not present

## 2018-06-23 DIAGNOSIS — M6282 Rhabdomyolysis: Secondary | ICD-10-CM | POA: Diagnosis not present

## 2018-06-23 DIAGNOSIS — R29898 Other symptoms and signs involving the musculoskeletal system: Secondary | ICD-10-CM | POA: Diagnosis not present

## 2018-06-23 DIAGNOSIS — I5042 Chronic combined systolic (congestive) and diastolic (congestive) heart failure: Secondary | ICD-10-CM | POA: Diagnosis not present

## 2018-06-23 DIAGNOSIS — K661 Hemoperitoneum: Secondary | ICD-10-CM | POA: Diagnosis not present

## 2018-06-23 DIAGNOSIS — I5032 Chronic diastolic (congestive) heart failure: Secondary | ICD-10-CM | POA: Diagnosis not present

## 2018-06-23 LAB — CBC
HEMATOCRIT: 30.5 % — AB (ref 36.0–46.0)
Hemoglobin: 9.2 g/dL — ABNORMAL LOW (ref 12.0–15.0)
MCH: 25.3 pg — ABNORMAL LOW (ref 26.0–34.0)
MCHC: 30.2 g/dL (ref 30.0–36.0)
MCV: 84 fL (ref 80.0–100.0)
NRBC: 1.3 % — AB (ref 0.0–0.2)
PLATELETS: 188 10*3/uL (ref 150–400)
RBC: 3.63 MIL/uL — ABNORMAL LOW (ref 3.87–5.11)
RDW: 19.2 % — AB (ref 11.5–15.5)
WBC: 6.9 10*3/uL (ref 4.0–10.5)

## 2018-06-23 LAB — BASIC METABOLIC PANEL
ANION GAP: 9 (ref 5–15)
BUN: 11 mg/dL (ref 8–23)
CALCIUM: 8.8 mg/dL — AB (ref 8.9–10.3)
CO2: 28 mmol/L (ref 22–32)
Chloride: 102 mmol/L (ref 98–111)
Creatinine, Ser: 0.84 mg/dL (ref 0.44–1.00)
Glucose, Bld: 124 mg/dL — ABNORMAL HIGH (ref 70–99)
Potassium: 4.2 mmol/L (ref 3.5–5.1)
Sodium: 139 mmol/L (ref 135–145)

## 2018-06-23 LAB — MAGNESIUM: Magnesium: 2 mg/dL (ref 1.7–2.4)

## 2018-06-23 MED ORDER — POLYETHYLENE GLYCOL 3350 17 G PO PACK: 17.0000 g | PACK | Freq: Every day | ORAL | 0 refills | Status: DC

## 2018-06-23 MED ORDER — CITALOPRAM HYDROBROMIDE 20 MG PO TABS: 20.0000 mg | ORAL_TABLET | Freq: Every day | ORAL | 0 refills | Status: DC

## 2018-06-23 MED ORDER — LABETALOL HCL 200 MG PO TABS: 200.0000 mg | ORAL_TABLET | Freq: Two times a day (BID) | ORAL | 0 refills | Status: AC

## 2018-06-23 MED ORDER — FUROSEMIDE 40 MG PO TABS
80.0000 mg | ORAL_TABLET | Freq: Every day | ORAL | Status: DC
  Administered 2018-06-23: 80 mg via ORAL
  Filled 2018-06-23: qty 2

## 2018-06-23 MED ORDER — HYDRALAZINE HCL 10 MG PO TABS: 10.0000 mg | ORAL_TABLET | Freq: Three times a day (TID) | ORAL | 0 refills | Status: DC

## 2018-06-23 MED ORDER — HYDRALAZINE HCL 10 MG PO TABS
10.0000 mg | ORAL_TABLET | Freq: Three times a day (TID) | ORAL | Status: DC
  Administered 2018-06-23: 10 mg via ORAL
  Filled 2018-06-23 (×2): qty 1

## 2018-06-23 MED ORDER — SENNOSIDES-DOCUSATE SODIUM 8.6-50 MG PO TABS: 1.0000 | ORAL_TABLET | Freq: Every day | ORAL | 0 refills | Status: AC

## 2018-06-23 NOTE — Progress Notes (Signed)
Physical Therapy Treatment Patient Details Name: [name] MRN: [id number] DOB: Apr 19, 1951 Today's Date: 06/23/2018    History of Present Illness 67 yo admitted 06/16/18 after a fall.  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 Admitted  to the hospital with hypertensive and tachycardic with a temperature of 100.8 lactic acid level 6.14.   X-ray evaluation showed an acute fracture of the left proximal humerus, minimally displaced T 10 fracture.CT scan that was consistent with acute retroperitoneal hemorrhage.    PT Comments    The patient is progressing well today for transfers. The patient began to feel hot and felt clammy. BP  189/106. Nursing is aware. Patient  Indicated that patient's family may not be able to provide  Care. Continue PT.  Follow Up Recommendations  SNF     Equipment Recommendations  None recommended by PT    Recommendations for Other Services       Precautions / Restrictions Precautions Precaution Comments: per Ortho, TLSO when OOB- did not use this therapy session Required Braces or Orthoses: Spinal Brace;Sling Spinal Brace: Thoracolumbosacral orthotic Restrictions Weight Bearing Restrictions: Yes LUE Weight Bearing: Non weight bearing    Mobility  Bed Mobility   Bed Mobility: Supine to Sit     Supine to sit: Mod assist     General bed mobility comments: patient is mobilizing very well today, requires less assistance  Transfers Overall transfer level: Needs assistance Equipment used: 1 person hand held assist(held onto back of chair for support) Transfers: Sit to/from [facility] Sit to Stand: Mod assist;+2 safety/equipment Stand pivot transfers: Mod assist       General transfer comment: bed to [facility] then [facility] to chair  Ambulation/Gait                 Stairs             Wheelchair Mobility     Modified Rankin (Stroke Patients Only)       Balance                                            Cognition Arousal/Alertness: Awake/alert                                            Exercises      General Comments        Pertinent Vitals/Pain Pain Assessment: Faces Faces Pain Scale: Hurts little more Pain Location: L arm with repositioning Pain Descriptors / Indicators: Discomfort Pain Intervention(s): Monitored during session;Premedicated before session;Ice applied    Home Living                      Prior Function            PT Goals (current goals can now be found in the care plan section) Progress towards PT goals: Progressing toward goals    Frequency    Min 2X/week      PT Plan Current plan remains appropriate    Co-evaluation PT/OT/SLP Co-Evaluation/Treatment: Yes Reason for Co-Treatment: To address functional/ADL transfers;For patient/therapist safety PT goals addressed during session: Mobility/safety with mobility OT goals addressed during session: ADL's and self-care  AM-PAC PT "6 Clicks" Daily Activity  Outcome Measure  Difficulty turning over in bed (including adjusting bedclothes, sheets and blankets)?: A Lot Difficulty moving from lying on back to sitting on the side of the bed? : A Lot Difficulty sitting down on and standing up from a chair with arms (e.g., wheelchair, bedside commode, etc,.)?: A Lot Help needed moving to and from a bed to chair (including a wheelchair)?: Total Help needed walking in hospital room?: Total Help needed climbing 3-5 steps with a railing? : Total 6 Click Score: 9    End of Session   Activity Tolerance: Patient tolerated treatment well Patient left: in chair;with call bell/phone within reach Nurse Communication: Mobility status PT Visit Diagnosis: Unsteadiness on feet (R26.81)     Time: 0131-4388 PT Time Calculation (min) (ACUTE ONLY): 35  min  Charges:  $Therapeutic Activity: 8-22 mins                     Tresa Endo PT Acute Rehabilitation Services Pager [phone] Office [phone]    Claretha Cooper 06/23/2018,

## 2018-06-23 NOTE — Progress Notes (Signed)
Contacted CIR-CIR coordinator Claiborne Billings will assess this am-await recc. Patient already has rw-[facility] will not deliver to rm. CM will follow for d/c plans.

## 2018-06-23 NOTE — Progress Notes (Signed)
Rehab Admissions Coordinator Note:  Per CM Juliann Pulse request, Patient was screened by Jhonnie Garner for appropriateness for CIR.  At this time, we are recommending [facility]. Pt is not a candidate for CIR due to tolerance and the need for a longer post acute rehab stay. AC will sign off. Please call if questions.   Jhonnie Garner 06/23/2018, 11:39 AM  I can be reached at [phone].

## 2018-06-23 NOTE — Progress Notes (Signed)
Occupational Therapy Treatment Patient Details Name: GRATIA DISLA MRN: [id number] DOB: [birth year] Today's Date: 06/23/2018    History of present illness 67 yo admitted 06/16/18 after a fall.  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 Admitted  to the hospital with hypertensive and tachycardic with a temperature of 100.8 lactic acid level 6.14.   X-ray evaluation showed an acute fracture of the left proximal humerus, minimally displaced T 10 fracture.CT scan that was consistent with acute retroperitoneal hemorrhage.   OT comments  Pt improved this day but realizes need for significant A with ADL activity   Follow Up Recommendations  SNF;Home health OT;Supervision/Assistance - 24 hour(pt is refusing; will need 2 caregivers simulatenously at hom)    [facility];3 in 1 bedside commode(wide [facility]    Recommendations for Other Services      Precautions / Restrictions Precautions Precaution Comments: per Ortho, TLSO when OOB- did not use this therapy session Required Braces or Orthoses: Spinal Brace;Sling Spinal Brace: Thoracolumbosacral orthotic Restrictions LUE Weight Bearing: Non weight bearing       Mobility Bed Mobility   Bed Mobility: Supine to Sit     Supine to sit: Mod assist;Max assist Sit to supine: Mod assist;Max assist      Transfers Overall transfer level: Needs assistance Equipment used: 1 person hand held assist(held onto back of chair) Transfers: Sit to/from [facility] Sit to Stand: Mod assist Stand pivot transfers: Mod assist       General transfer comment: bed to [facility] then [facility] to chair    Balance Overall balance assessment: Needs assistance;History of Falls Sitting-balance support: Feet supported;Single extremity supported Sitting balance-Leahy Scale: Fair Sitting balance - Comments: sat at mid line  with RUE support   Standing balance support: Single extremity supported;During functional activity Standing balance-Leahy Scale: Fair Standing balance comment: stood at midline x 1 moinute                           ADL either performed or assessed with clinical judgement   ADL Overall ADL's : Needs assistance/impaired                         Toilet Transfer: Moderate assistance;+2 for safety/equipment;+2 for physical assistance;Stand-pivot;BSC   Toileting- Clothing Manipulation and Hygiene: +2 for safety/equipment;+2 for physical assistance;Total assistance;Sit to/from stand;Cueing for sequencing;Cueing for safety         General ADL Comments: pt aware she needed significnat A this day and seemed to be considering other options for Dc other than home.  Called for new sling as one in room was a medium which was too small     Vision Patient Visual Report: No change from baseline            Cognition Arousal/Alertness: Awake/alert Behavior During Therapy: WFL for tasks assessed/performed Overall Cognitive Status: Within Functional Limits for tasks assessed                                                     Pertinent Vitals/ Pain       Faces Pain Scale: Hurts little more Pain Location: L arm with repositioning Pain Descriptors / Indicators: Discomfort Pain Intervention(s):  Limited activity within patient's tolerance         Frequency  Min 2X/week        Progress Toward Goals  OT Goals(current goals can now be found in the care plan section)  Progress towards OT goals: Progressing toward goals     Plan Discharge plan remains appropriate;Discharge plan needs to be updated(pt needs rehab but refuses)    Co-evaluation    PT/OT/SLP Co-Evaluation/Treatment: Yes Reason for Co-Treatment: To address functional/ADL transfers PT goals addressed during session: Mobility/safety with mobility OT goals addressed during session:  ADL's and self-care      AM-PAC PT "6 Clicks" Daily Activity     Outcome Measure   Help from another person eating meals?: A Little Help from another person taking care of personal grooming?: A Lot Help from another person toileting, which includes using toliet, bedpan, or urinal?: Total Help from another person bathing (including washing, rinsing, drying)?: A Lot Help from another person to put on and taking off regular upper body clothing?: A Lot Help from another person to put on and taking off regular lower body clothing?: Total 6 Click Score: 11    End of Session    OT Visit Diagnosis: Muscle weakness (generalized) (M62.81);Pain;History of falling (Z91.81) Pain - Right/Left: Left Pain - part of body: Shoulder   Activity Tolerance Patient tolerated treatment well   Patient Left with call bell/phone within reach;in chair   Nurse Communication Mobility status        Time: 1035-1110 OT Time Calculation (min): 35 min  Charges: OT General Charges $OT Visit: 1 Visit OT Treatments $Self Care/Home Management : 8-22 mins  Kari Baars, [place] [phone] Office- [phone]      [place], [place] 06/23/2018, 1:12 PM

## 2018-06-23 NOTE — Discharge Summary (Signed)
Discharge Summary  JENNYFER NICKOLSON ZOX:[id number] DOB: [birth year]  PCP: [name], MD  Admit date: 06/16/2018 Discharge date: 06/23/2018  Time spent: 63mins, more than 50% time spent on coordination of care.  Recommendations for Outpatient Follow-up:  1. F/u with PCP within a week  for hospital discharge follow up, repeat cbc/bmp at follow up.   pcp to follow up on retroperitoneum hemorrhage, repeat imaging if needed pcp to monitor blood pressure control, adjust bp meds as needed pcp to arrange snf placement if you decides to  2. F/u with ortho Dr Griffin Basil in one week for left proximal humerus fracture 3. F/u with [facility] for T10 vertebral fracture 4. Patient adamantly declined SNF placement, she agrees to home health, maximize home health.  Discharge Diagnoses:  Active Hospital Problems   Diagnosis Date Noted  . Fever 06/18/2018  . Fracture of neck of left humerus, closed, initial encounter   . Sepsis ([place]) 06/16/2018  . Nausea vomiting and diarrhea 06/16/2018  . Back pain 06/16/2018  . Humerus fracture 06/16/2018  . Thrombocytopenia ([place]) 06/16/2018  . CKD (chronic kidney disease) stage 3, GFR 30-59 ml/min (HCC) 06/16/2018  . Depression 06/16/2018  . Physical deconditioning 06/16/2018  . Hypertension 03/17/2015  . Hyperlipidemia 03/17/2015  . Chronic congestive heart failure ([place]) 03/06/2015    Resolved Hospital Problems  No resolved problems to display.    Discharge Condition: stable  Diet recommendation: heart healthy  Filed Weights   06/16/18 1308  Weight: 95.3 kg    History of present illness: (per admitting MD Dr Marlowe Sax) PCP: [name], MD Patient coming from: Home  Chief Complaint: Fall  HPI: MAUDELL STANBROUGH is a 67 y.o. female with medical history significant of CKD stage III, CHF, hypertension, hyperlipidemia, discitis and epidural abscess in the setting of history of laminectomy/decompression in 2017 presenting to the hospital  for evaluation after a fall at home today.  Patient states she was walking to the bathroom and her legs gave out and she fell on the floor.  She could not get up as her legs felt weak and neighbor had to call EMS.  Denies any loss of consciousness or head injury.  Denies having any chest pain or shortness of breath prior to the fall.  Reports having chronic lumbar back pain from previous surgeries which has been worse recently.  States she went to see her neurosurgeon yesterday.  Denies having any fevers or chills.  States that she has a history of IBS and has chronic diarrhea.  Denies having any abdominal pain and states her appetite is fair.  States she started vomiting after the fall today; bilious vomit.  ED Course: Temperature 100.8 F.  Tachycardic, tachypneic.  Blood pressure elevated in the ED with highest reading 245/85.  Satting well on room air.  White count 12.9.  Lactic acid 6.14. Creatinine 1.1; baseline 0.9-1.1.  Repeat lactate 4.0 > 1.0 with fluid resuscitation.  X-ray showing acute fracture of the left proximal humerus surgical neck and greater tuberosity.  X-ray of left hip showing no acute osseous abnormality.  X-ray of lumbar spine showing no acute osseous abnormality; stable lumbar spondylosis and postsurgical changes.  CT of left hip showing no acute osseous injury; mild early osteoarthritis.  ED provider discussed the case with Dr. Megan Salon from infectious disease .  Patient was started on broad-spectrum antibiotics including vancomycin, cefepime, and metronidazole.  Hospital Course:  Principal Problem:   Fever Active Problems:   Chronic congestive heart failure ([place])  Hypertension   Hyperlipidemia   Sepsis (HCC)   Nausea vomiting and diarrhea   Back pain   Humerus fracture   Thrombocytopenia (HCC)   CKD (chronic kidney disease) stage 3, GFR 30-59 ml/min (HCC)   Depression   Physical deconditioning   Fracture of neck of left humerus, closed, initial encounter    Sepsis  Ruled out -she has Fever 100.8,sinus tachycardia, heart rate 110-120's on presentation, wbc12.9, lactic acid 6.14 on presentation -UA with few bacteria, urine culture negative, blood culture negative, MRSA screening negative, CT spine imaging there is no concern of infection there -cxr with "Borderline stable cardiomegaly with suggestion of mild vascular congestion" -Patient reported having diarrhea at home, but no BM  since arrived to the hospital -she is started on broad spectrum abx,-Infectious disease consulted -abx d/ed on 11/1, patient has been afibrile off abx, infectious disease signed off.  AKI on CKDII -cr baseline 0.9 -cr jumped to 3.29 on second day of hospitalization -ct on presentation on obstruction,  -ua with few bacteria, urine culture no growth -bladder scan on 10/31 with 311cc urine,  foley inserted for urinary retention  on 10/31 --worsening of cr likely multifactorial, she does has mild elevated ck, she received vanc on admission, -nephrology consulted, cr back to baseline today, nephrology signed off -foley removed , increase activity , avoid constipation     Acute minimally displaced fracture extending through the anterior-inferior aspect of the T10 vertebral body Admitting MD Dr Marlowe Sax discussed with neurosurgery PA on-call (Mr. Cato Mulligan) consulted who recommended "no surgical intervention is needed at this time. Recommendation is to use TLSO brace and outpatient neurosurgeryfollow-up."  Minimally displaced L proximal humerus fracture  ortho Dr Griffin Basil consulted Per Dr Vincente Poli in sling x6 weeks, followup outpatient when discharged. No PT or ROM of shoulder. Sling at all times.OK to move hand and wristwhile in sling."  Acute retroperitoneum hemorrhage: Scattered soft tissue stranding within the retroperitoneum and pelvis, consistent with acute hemorrhage, overall small to moderate in volume.  Admitting MD Dr Marlowe Sax discussed with general surgery  on call Dr Excell Seltzer who states "no surgical intervention is needed at this time." -blood pressure and hemoglobin stable, continue monitor  Acute Anemia/thrombocytopenia Likely secondary to retroperitoneum hemorrage plt  Nadir to 81, has normalized  hgb nadir to 8.9, improving, she did not need blood transfusion. low iron sats at 5%, nephrology prescribed iv iron on 11/3 and 11/4. bp stable, she denies ab pain or back pain Do not need to repeat ct ab  Now that hgb and plt has improved, bp stable, patient denies ab pain.  She is to follow up with pcp.  Acute metabolic Encephalopathy -She is drowsy slightly confused on presentation which is not her baseline -Stat CT head no acute findings -Taper clonidine to off, have discussed with patient ,she agrees to -resolved, now alert , awake , no confusion  Nausea and vomiting,chronic diarrhea Non observed since admission  ct ab no obstruction  She has constipation in the hospital, needs stool softener   History of chronic combined CHF Last EF in 2015 was 50% with diffuse hyper hypokinesis, and grade 1 diastolic dysfunction  Lasix held since admission due to drowsiness and poor oral intake -she is improving, resume lasix on 11/4 -Close monitor volume status-Currently no edema  Hypertension  taperred clonidine  To off due to sedative side effect Lopressor changed to labetalol for better bp control, she is started on low dose hydralazine 10mg  tid, resume lasix 80mg  daily. Consider add  on imdur if bp still not well controlled, to be determined by pcp at hospital discharge follow up home meds losartan HCTZ held and discontinued at discharge due to tendency of renal Impairment  Obesity:  Body mass index is 37.2 kg/m. she denies h/o OSA  FTT:  Per daughter patient has been mostly in bed for 45-month due to multiple back issues.  She does walk to the car and go to church on the weekend Per PT OT eval, patient need two person assist  24/7 for now Patient adamantly declined SNF placement CIR consulted, She is not a candidate for CIR Arrange home health   Code Status: full  Family Communication: patient daily and daughter at bedside on 10/30 and on 11/4  Disposition Plan: d/c home with home health, she declined SNF   Consultants:  ID  Nephrology  CIR  Procedures:  none  Antibiotics:  Vancx1 on 10/29 and x1 on 10/30 Cefepime from admission to 11/2 daptomycin from admission to 11/2   Discharge Exam: BP (!) 140/95   Pulse 84   Temp 98.2 F (36.8 C) (Oral)   Resp 16   Ht 5\' 3"  (1.6 m)   Wt 95.3 kg   SpO2 97%   BMI 37.20 kg/m     General:  Drowsiness has resolved, Fully alert today, oriented x3  Cardiovascular: RRR  Respiratory: CTABL  Abdomen: Soft/ND/NT, positive BS  Musculoskeletal: No Edema, legs able to move more  Neuro: oriented x3, fully alert   Discharge Instructions You were cared for by a hospitalist during your hospital stay. If you have any questions about your discharge medications or the care you received while you were in the hospital after you are discharged, you can call the unit and asked to speak with the hospitalist on call if the hospitalist that took care of you is not available. Once you are discharged, your primary care physician will handle any further medical issues. Please note that NO REFILLS for any discharge medications will be authorized once you are discharged, as it is imperative that you return to your primary care physician (or establish a relationship with a primary care physician if you do not have one) for your aftercare needs so that they can reassess your need for medications and monitor your lab values.  Discharge Instructions    Diet - low sodium heart healthy   Complete by:  As directed    Increase activity slowly   Complete by:  As directed      Allergies as of 06/23/2018      Reactions   Vancomycin Other (See Comments)   Drug  induced NEUTROPENIA   Aspirin Other (See Comments)   Stomach bleeding   Ibuprofen Other (See Comments)   Stomach bleeding   Mushroom Extract Complex Hives, Itching   Shellfish Allergy Hives, Itching   Sulfa Antibiotics Hives, Itching   Sulfasalazine Itching, Hives   Tylenol [acetaminophen] Itching   Betadine [povidone Iodine] Itching   Coconut Flavor Itching   Codeine Itching   Eggs Or Egg-derived Products Nausea And Vomiting   Iodine Itching   Ivp Dye [iodinated Diagnostic Agents] Hives   Takes Benadryl 50mg  PO before receiving iodinated contrast   Metrizamide Hives   Takes Benadryl 50mg  PO before receiving iodinated contrast      Medication List    STOP taking these medications   buPROPion 100 MG 12 hr tablet Commonly known as:  WELLBUTRIN SR   cefUROXime 500 MG tablet Commonly known as:  CEFTIN   cloNIDine 0.3 MG tablet Commonly known as:  CATAPRES   fluconazole 100 MG tablet Commonly known as:  DIFLUCAN   loperamide 2 MG tablet Commonly known as:  IMODIUM A-D   losartan-hydrochlorothiazide 100-12.5 MG tablet Commonly known as:  HYZAAR   metoprolol tartrate 25 MG tablet Commonly known as:  LOPRESSOR   nystatin powder Commonly known as:  MYCOSTATIN/NYSTOP     TAKE these medications   VENTOLIN HFA 108 (90 Base) MCG/ACT inhaler Generic drug:  albuterol Inhale 2 puffs into the lungs every 4 (four) hours as needed for wheezing or shortness of breath.   albuterol (2.5 MG/3ML) 0.083% nebulizer solution Commonly known as:  PROVENTIL Take 2.5 mg by nebulization every 6 (six) hours as needed for wheezing.   citalopram 20 MG tablet Commonly known as:  CELEXA Take 1 tablet (20 mg total) by mouth daily. Start taking on:  06/24/2018 What changed:    medication strength  how much to take   diphenhydrAMINE 25 MG tablet Commonly known as:  BENADRYL Take 25 mg by mouth every 6 (six) hours as needed for itching.   furosemide 80 MG tablet Commonly known as:   LASIX Take 80 mg by mouth daily.   gabapentin 300 MG capsule Commonly known as:  NEURONTIN Take 300-600 mg by mouth at bedtime.   hydrALAZINE 10 MG tablet Commonly known as:  APRESOLINE Take 1 tablet (10 mg total) by mouth 3 (three) times daily.   labetalol 200 MG tablet Commonly known as:  NORMODYNE Take 1 tablet (200 mg total) by mouth 2 (two) times daily.   NEXIUM 40 MG capsule Generic drug:  esomeprazole Take 40 mg by mouth daily.   ondansetron 4 MG tablet Commonly known as:  ZOFRAN Take 1 tablet (4 mg total) by mouth every 6 (six) hours as needed for nausea or vomiting.   oxyCODONE-acetaminophen 5-325 MG tablet Commonly known as:  PERCOCET/ROXICET Take 1 tablet by mouth every 6 (six) hours as needed for severe pain. What changed:  how much to take   polyethylene glycol packet Commonly known as:  MIRALAX / GLYCOLAX Take 17 g by mouth daily.   potassium chloride SA 20 MEQ tablet Commonly known as:  K-DUR,KLOR-CON Take 20 mEq by mouth 2 (two) times daily.   senna-docusate 8.6-50 MG tablet Commonly known as:  Senokot-S Take 1 tablet by mouth at bedtime.   simvastatin 20 MG tablet Commonly known as:  ZOCOR Take 20 mg by mouth every evening.            Durable Medical Equipment  (From admission, onward)         Start     Ordered   06/22/18 1314  For home use only DME Walker rolling  Once    Question:  Patient needs a walker to treat with the following condition  Answer:  Unsteady gait   06/22/18 1314         Allergies  Allergen Reactions  . Vancomycin Other (See Comments)    Drug induced NEUTROPENIA  . Aspirin Other (See Comments)    Stomach bleeding  . Ibuprofen Other (See Comments)    Stomach bleeding  . Mushroom Extract Complex Hives and Itching  . Shellfish Allergy Hives and Itching  . Sulfa Antibiotics Hives and Itching  . Sulfasalazine Itching and Hives  . Tylenol [Acetaminophen] Itching  . Betadine [Povidone Iodine] Itching  . Coconut  Flavor Itching  . Codeine Itching  . Eggs Or Egg-Derived Products Nausea And  Vomiting  . Iodine Itching  . Ivp Dye [Iodinated Diagnostic Agents] Hives    Takes Benadryl 50mg  PO before receiving iodinated contrast   . Metrizamide Hives    Takes Benadryl 50mg  PO before receiving iodinated contrast   Follow-up Information    Hiram Gash, MD. Schedule an appointment as soon as possible for a visit in 1 week.   Specialty:  Orthopedic Surgery Contact information: [address] [place] [address] [phone]        Traci Sermon, PA-C Follow up in 1 week(s).   Specialty:  Physician Assistant Why:  T10 vertebral body fracture Contact information: [place] [address] [phone]        [facility] Follow up.   Specialty:  [place] Why:  [facility] nursing/physical therapy/social worker Contact information: [place] [address] [phone]        [name], MD Follow up in 2 week(s).   Specialty:  Internal Medicine Why:  hospital discharge follow up, repeat cbc/bmp at follow up.  pcp to follow up on retroperitoneum hemorrhage, repeat imaging if needed pcp to monitor blood pressure control, adjust bp meds as needed pcp to arrange snf placement if you decides to  Contact information: [place] [address] [phone]            The results of significant diagnostics from this hospitalization (including imaging, microbiology, ancillary and laboratory) are listed below for reference.    Significant Diagnostic Studies: Ct Abdomen Pelvis Wo Contrast  Result Date: 06/17/2018 CLINICAL DATA:  Initial evaluation for acute trauma, fall. EXAM: CT ABDOMEN AND PELVIS WITHOUT CONTRAST TECHNIQUE: Multidetector CT imaging of the abdomen and pelvis was performed following the standard protocol without IV contrast. COMPARISON:  Prior CT from 01/31/2018. FINDINGS:  Lower chest: Small layering right pleural effusion. Bibasilar atelectatic changes. 14 mm densely calcified nodule noted at the left lower lobe. Calcified left hilar adenopathy partially visualized. Findings consistent with prior granulomatous infection. Partially visualized lungs are otherwise clear. Hepatobiliary: Limited noncontrast evaluation of the liver is unremarkable. Gallbladder peers to be absent. No appreciable biliary dilatation. Pancreas: Pancreas within normal limits. Spleen: Scattered calcified granulomas noted within the spleen. Spleen otherwise grossly unremarkable. Adrenals/Urinary Tract: Adrenal glands grossly unremarkable. Kidneys equal in size 3.2 cm cyst present within the interpolar right kidney. No nephrolithiasis or hydronephrosis. No appreciable hydroureter. 4 mm calcific density positioned near the level of the right UPJ favored to reflect a vascular phlebolith. Partially distended bladder within normal limits. Stomach/Bowel: Small hiatal hernia noted. Stomach otherwise unremarkable. No evidence for bowel obstruction. Colonic diverticulosis without evidence for acute diverticulitis. No acute inflammatory changes seen about the bowels. Vascular/Lymphatic: Limited noncontrast evaluation of the intra-abdominal aorta are grossly unremarkable. No appreciable aneurysm. No adenopathy. Reproductive: Uterus is absent.  Ovaries not discretely identified. Other: No free intraperitoneal air. Ill-defined stranding within the retroperitoneal space, extending along the psoas musculature bilaterally, concerning for retroperitoneal hematoma. Small amount of hemorrhage present within the central pelvis as well. Sequelae of prior ventral hernia repair noted. Musculoskeletal: Acute fracture involving the anterior-inferior end plate of O75, better evaluated on prior CT of the thoracic spine. No other acute osseous abnormality. Postsurgical changes noted within the thoracolumbar spine. No discrete lytic or  blastic osseous lesions. IMPRESSION: 1. Scattered soft tissue stranding within the retroperitoneum and pelvis, consistent with acute hemorrhage, overall small to moderate in volume. No definite visceral injury identified on this  noncontrast examination. 2. No other acute traumatic injury within the abdomen and pelvis. 3. Small layering right pleural effusion with associated bibasilar atelectasis. 4. Colonic diverticulosis without evidence for acute diverticulitis. Critical Value/emergent results were called by telephone at the time of interpretation on 06/17/2018 at 12:06 am to Dr. Shela Leff , who verbally acknowledged these results. Electronically Signed   By: Jeannine Boga M.D.   On: 06/17/2018 00:13   Dg Lumbar Spine Complete  Result Date: 06/16/2018 CLINICAL DATA:  Low back pain after fall. EXAM: LUMBAR SPINE - COMPLETE 4+ VIEW COMPARISON:  CT abdomen pelvis dated January 31, 2018. FINDINGS: Five lumbar type vertebral bodies. Prior L3-S1 posterior decompression and L4-L5 interbody fusion. No acute fracture or subluxation. Vertebral body heights are preserved. Unchanged trace retrolisthesis at L2-L3 and trace anterolisthesis at L4-L5. Stable mild disc height loss at L2-L3, L3-L4, and L5-S1. Moderate facet arthropathy at L5-S1 IMPRESSION: 1.  No acute osseous abnormality. 2. Stable lumbar spondylosis and postsurgical changes. Electronically Signed   By: Titus Dubin M.D.   On: 06/16/2018 15:42   Ct Head Wo Contrast  Result Date: 06/17/2018 CLINICAL DATA:  Encephalopathy. Fell face down yesterday. EXAM: CT HEAD WITHOUT CONTRAST TECHNIQUE: Contiguous axial images were obtained from the base of the skull through the vertex without intravenous contrast. COMPARISON:  None. FINDINGS: Brain: Mild patchy white matter low density in both cerebral hemispheres. Normal size and position of the ventricles. No intracranial hemorrhage, mass lesion or CT evidence of acute infarction. Vascular: No  hyperdense vessel or unexpected calcification. Skull: Bilateral hyperostosis frontalis. No skull fractures seen. Sinuses/Orbits: Status post cataract extraction on the left. Unremarkable right orbit and paranasal sinuses. Other: None. IMPRESSION: 1. No acute abnormality. 2. Mild chronic small vessel white matter ischemic changes in both cerebral hemispheres. Electronically Signed   By: Claudie Revering M.D.   On: 06/17/2018 17:02   Ct Thoracic Spine Wo Contrast  Result Date: 06/16/2018 CLINICAL DATA:  Initial evaluation for acute back pain status post fall. EXAM: CT THORACIC AND LUMBAR SPINE WITHOUT CONTRAST TECHNIQUE: Multidetector CT imaging of the thoracic and lumbar spine was performed without contrast. Multiplanar CT image reconstructions were also generated. COMPARISON:  Prior radiograph from earlier the same day as well as earlier studies. FINDINGS: CT THORACIC SPINE FINDINGS Alignment: Examination technically limited by body habitus and motion artifact. Vertebral bodies normally aligned with preservation of the normal thoracic kyphosis. No listhesis or malalignment. Vertebrae: Linear lucency extending through the anterior aspect of the T10 vertebral body consistent with acute minimally displaced fracture (9, image 32). Extension through the adjacent inferior endplate and [id number] interspace without appreciable distraction. No significant vertebral body height loss. This is also seen on coronal sequence (series 8, image 24). Possible extension to adjacent pole the endplate osteophytes. Vertebral body heights otherwise maintained with no other definite acute fracture identified. 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 Postsurgical changes from prior posterior decompression at T10-11 and T11-12 noted. Paraspinal and other soft tissues: Chronic postsurgical changes present within the lower posterior paraspinous soft tissues. Paraspinous soft tissues demonstrate no acute  abnormality. Layering right pleural effusion partially visualized. Atelectatic changes noted within the lungs bilaterally. Disc levels: T11-12: Large central disc osteophyte complex noted. Prior decompressive laminectomy. Additional multilevel scattered endplate changes noted throughout the mid and lower thoracic spine. Multilevel facet arthropathy noted. No other significant canal stenosis. CT LUMBAR SPINE FINDINGS Segmentation: Examination technically limited by body habitus and motion artifact. Normal segmentation. Lowest well-formed disc labeled  the L5-S1 level. Alignment: Mild dextroscoliosis. Trace retrolisthesis of L2 on L3. Alignment otherwise normal with preservation of the normal lumbar lordosis. Vertebrae: Vertebral body heights maintained without evidence for acute or chronic fracture. Postsurgical changes from prior decompressive laminectomy at L3-4 through L5-S1. Interbody device in place at L4-5. Prior hardware removal at L4 and L5. Probable benign hemangiomas involving the L4 and S1 vertebral bodies noted. No other discrete or worrisome osseous lesions. Paraspinal and other soft tissues: Chronic postsurgical changes present within the lower posterior paraspinous soft tissues. Paraspinous soft tissues demonstrate no acute finding. Disc levels: L1-2: Minimal annular disc bulge. Moderate bilateral facet hypertrophy. No significant stenosis. L2-3: Trace retrolisthesis. Diffuse disc bulge with intervertebral disc space narrowing. Advanced bilateral facet hypertrophy. Resultant moderate canal with moderate to severe bilateral L2 foraminal stenosis. L3-4: Mild disc bulge. Prior decompressive laminectomy. Moderate to advanced bilateral facet hypertrophy. No significant spinal stenosis. Severe left with mild to moderate right L3 foraminal narrowing. L4-5: Prior discectomy with interbody fusion, with posterior decompression. Residual bilateral facet arthrosis. No residual canal stenosis. Mild to moderate  bilateral L4 foraminal narrowing. L5-S1: Diffuse disc bulge. Prior posterior decompression. Advanced bilateral facet arthrosis. No significant residual spinal stenosis. Severe bilateral L5 foraminal narrowing. IMPRESSION: CT THORACIC SPINE IMPRESSION 1. Acute minimally displaced fracture extending through the anterior-inferior aspect of the T10 vertebral body as above. No associated listhesis or malalignment. 2. No other acute traumatic injury identified within the thoracic spine. 3. Large central disc osteophyte complex at T11-12 with sequelae of prior decompressive laminectomy at T10-11 and T11-12. 4. Layering right pleural effusion with bibasilar atelectatic changes. CT LUMBAR SPINE IMPRESSION 1. No acute traumatic injury within the lumbar spine. 2. Postsurgical changes from prior posterior decompression at L3-4 through L5-S1, with interbody fusion at L4-5. 3. 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 Resultant moderate spinal stenosis at L2-3, with moderate to advanced multilevel foraminal narrowing, most notable at L5-S1 bilaterally. Electronically Signed   By: Jeannine Boga M.D.   On: 06/16/2018 23:48   Ct Lumbar Spine Wo Contrast  Result Date: 06/16/2018 CLINICAL DATA:  Initial evaluation for acute back pain status post fall. EXAM: CT THORACIC AND LUMBAR SPINE WITHOUT CONTRAST TECHNIQUE: Multidetector CT imaging of the thoracic and lumbar spine was performed without contrast. Multiplanar CT image reconstructions were also generated. COMPARISON:  Prior radiograph from earlier the same day as well as earlier studies. FINDINGS: CT THORACIC SPINE FINDINGS Alignment: Examination technically limited by body habitus and motion artifact. Vertebral bodies normally aligned with preservation of the normal thoracic kyphosis. No listhesis or malalignment. Vertebrae: Linear lucency extending through the anterior aspect of the T10 vertebral body consistent with acute minimally  displaced fracture (9, image 32). Extension through the adjacent inferior endplate and [id number] interspace without appreciable distraction. No significant vertebral body height loss. This is also seen on coronal sequence (series 8, image 24). Possible extension to adjacent pole the endplate osteophytes. Vertebral body heights otherwise maintained with no other definite acute fracture identified. 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 Postsurgical changes from prior posterior decompression at T10-11 and T11-12 noted. Paraspinal and other soft tissues: Chronic postsurgical changes present within the lower posterior paraspinous soft tissues. Paraspinous soft tissues demonstrate no acute abnormality. Layering right pleural effusion partially visualized. Atelectatic changes noted within the lungs bilaterally. Disc levels: T11-12: Large central disc osteophyte complex noted. Prior decompressive laminectomy. Additional multilevel scattered endplate changes noted throughout the mid and lower thoracic spine. Multilevel facet arthropathy  noted. No other significant canal stenosis. CT LUMBAR SPINE FINDINGS Segmentation: Examination technically limited by body habitus and motion artifact. Normal segmentation. Lowest well-formed disc labeled the L5-S1 level. Alignment: Mild dextroscoliosis. Trace retrolisthesis of L2 on L3. Alignment otherwise normal with preservation of the normal lumbar lordosis. Vertebrae: Vertebral body heights maintained without evidence for acute or chronic fracture. Postsurgical changes from prior decompressive laminectomy at L3-4 through L5-S1. Interbody device in place at L4-5. Prior hardware removal at L4 and L5. Probable benign hemangiomas involving the L4 and S1 vertebral bodies noted. No other discrete or worrisome osseous lesions. Paraspinal and other soft tissues: Chronic postsurgical changes present within the lower posterior paraspinous soft tissues.  Paraspinous soft tissues demonstrate no acute finding. Disc levels: L1-2: Minimal annular disc bulge. Moderate bilateral facet hypertrophy. No significant stenosis. L2-3: Trace retrolisthesis. Diffuse disc bulge with intervertebral disc space narrowing. Advanced bilateral facet hypertrophy. Resultant moderate canal with moderate to severe bilateral L2 foraminal stenosis. L3-4: Mild disc bulge. Prior decompressive laminectomy. Moderate to advanced bilateral facet hypertrophy. No significant spinal stenosis. Severe left with mild to moderate right L3 foraminal narrowing. L4-5: Prior discectomy with interbody fusion, with posterior decompression. Residual bilateral facet arthrosis. No residual canal stenosis. Mild to moderate bilateral L4 foraminal narrowing. L5-S1: Diffuse disc bulge. Prior posterior decompression. Advanced bilateral facet arthrosis. No significant residual spinal stenosis. Severe bilateral L5 foraminal narrowing. IMPRESSION: CT THORACIC SPINE IMPRESSION 1. Acute minimally displaced fracture extending through the anterior-inferior aspect of the T10 vertebral body as above. No associated listhesis or malalignment. 2. No other acute traumatic injury identified within the thoracic spine. 3. Large central disc osteophyte complex at T11-12 with sequelae of prior decompressive laminectomy at T10-11 and T11-12. 4. Layering right pleural effusion with bibasilar atelectatic changes. CT LUMBAR SPINE IMPRESSION 1. No acute traumatic injury within the lumbar spine. 2. Postsurgical changes from prior posterior decompression at L3-4 through L5-S1, with interbody fusion at L4-5. 3. 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 Resultant moderate spinal stenosis at L2-3, with moderate to advanced multilevel foraminal narrowing, most notable at L5-S1 bilaterally. Electronically Signed   By: Jeannine Boga M.D.   On: 06/16/2018 23:48   US Renal  Result Date: 06/18/2018 CLINICAL  DATA:  Elevation of creatinine. EXAM: RENAL / URINARY TRACT ULTRASOUND COMPLETE COMPARISON:  CT 06/16/2018. FINDINGS: Right Kidney: Renal measurement: 9.4 cm. Echogenicity within normal limits. 2.9 cm simple cyst right kidney. No hydronephrosis visualized. Left Kidney: Renal measurement: Not visualized. Exam very limited due to patient's body habitus and left arm injury. Bladder: Appears normal for degree of bladder distention. IMPRESSION: 1. Left kidney not visualized due to patient's body habitus and inability to move left arm. 2.  2.9 cm simple cyst right kidney. Electronically Signed   By: Marcello Moores  Register   On: 06/18/2018 12:13   Ct Hip Left Wo Contrast  Result Date: 06/16/2018 CLINICAL DATA:  Left hip pain. EXAM: CT OF THE LEFT HIP WITHOUT CONTRAST TECHNIQUE: Multidetector CT imaging of the left hip was performed according to the standard protocol. Multiplanar CT image reconstructions were also generated. COMPARISON:  None. FINDINGS: Bones/Joint/Cartilage No fracture or dislocation. Normal alignment. No joint effusion. Mild left hip joint space narrowing with tiny marginal osteophytes. Mild osteoarthritis of bilateral sacroiliac. No aggressive osseous lesion. No periosteal reaction or bone destruction. Ligaments Ligaments are suboptimally evaluated by CT. Muscles and Tendons Muscles are normal. No muscle atrophy. No intramuscular fluid collection or hematoma. Soft tissue No fluid collection or hematoma.  No  soft tissue mass. IMPRESSION: 1.  No acute osseous injury of the left hip. 2. 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 Electronically Signed   By: Kathreen Devoid   On: 06/16/2018 17:10   Dg Chest Port 1 View  Result Date: 06/16/2018 CLINICAL DATA:  Sepsis. EXAM: PORTABLE CHEST 1 VIEW COMPARISON:  05/18/2017 FINDINGS: Lungs are adequately inflated with mild hazy perihilar prominence suggesting mild vascular congestion. No lobar consolidation or effusion. Borderline stable cardiomegaly. Remainder of  the exam is unchanged. IMPRESSION: Borderline stable cardiomegaly with suggestion of mild vascular congestion. Electronically Signed   By: Marin Olp M.D.   On: 06/16/2018 22:49   Dg Humerus Left  Result Date: 06/16/2018 CLINICAL DATA:  Left shoulder pain after fall. EXAM: LEFT HUMERUS - 2+ VIEW COMPARISON:  None. FINDINGS: Acute fracture of the proximal humerus involving the surgical neck and greater tuberosity. No dislocation. Moderate acromioclavicular joint space narrowing with bony hypertrophy. Soft tissues are unremarkable. IMPRESSION: Acute fracture of the proximal humerus surgical neck and greater tuberosity. Electronically Signed   By: Titus Dubin M.D.   On: 06/16/2018 15:30   Dg Hip Unilat With Pelvis 2-3 Views Left  Result Date: 06/16/2018 CLINICAL DATA:  Left hip pain after fall. EXAM: DG HIP (WITH OR WITHOUT PELVIS) 2-3V LEFT COMPARISON:  Pelvis and right hip x-rays dated February 12, 2018. CT abdomen pelvis dated July 25, 2009. FINDINGS: No acute fracture or dislocation. The pubic symphysis and sacroiliac joints are intact. The hip joint spaces are relatively preserved. 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 Partially visualized cortical thickening of the right proximal femoral diaphysis with heterotopic ossification, grossly unchanged dating back to prior CTs from 2010 and likely posttraumatic in etiology. Degenerative changes of the lower lumbar spine. IMPRESSION: 1.  No acute osseous abnormality. Electronically Signed   By: Titus Dubin M.D.   On: 06/16/2018 15:40    Microbiology: Recent Results (from the past 240 hour(s))  Blood Culture (routine x 2)     Status: None (Preliminary result)   Collection Time: 06/16/18  7:38 PM  Result Value Ref Range Status   Specimen Description   Final    BLOOD RIGHT ANTECUBITAL Performed at [facility] [address] [place], [place] [address]    Special Requests    Final    BOTTLES DRAWN AEROBIC AND ANAEROBIC Blood Culture adequate volume Performed at [facility] [address], [place], [place] [address]    Culture   Final    NO GROWTH 4 DAYS Performed at [facility], [place] [address], [place], [place] [address]    Report Status PENDING  Incomplete  Blood Culture (routine x 2)     Status: None (Preliminary result)   Collection Time: 06/16/18  7:38 PM  Result Value Ref Range Status   Specimen Description   Final    BLOOD RIGHT FOREARM Performed at [facility] [address] [place], [place] [address]    Special Requests   Final    BOTTLES DRAWN AEROBIC ONLY Blood Culture adequate volume Performed at [facility] [address], [place], [place] [address]    Culture   Final    NO GROWTH 4 DAYS Performed at [facility], [place] [address], [place],  [address]    Report Status PENDING  Incomplete  Culture, Urine     Status: None   Collection Time: 06/16/18  7:38 PM  Result Value Ref Range Status   Specimen Description   Final    URINE, RANDOM Performed at [facility] [address], [place], [place] [address]    Special Requests   Final    NONE Performed at [facility], [place] [address], [place], [place] [address]    Culture   Final    NO GROWTH Performed at [facility], [place] [address], [place], [place] [address]    Report Status 06/19/2018 FINAL  Final  MRSA PCR Screening     Status: None   Collection Time: 06/17/18 12:07 PM  Result Value Ref Range Status   MRSA by PCR NEGATIVE NEGATIVE Final    Comment:        The GeneXpert MRSA Assay (FDA approved for NASAL specimens only), is one component of a comprehensive MRSA colonization surveillance program. It is not intended to diagnose MRSA infection nor to guide or monitor treatment for MRSA infections. Performed at [facility], [place]  [address], [place],  [address]      Labs: Basic Metabolic Panel: Recent Labs  Lab 06/18/18 0320 06/19/18 0354 06/20/18 0653 06/20/18 1300 06/21/18 0352 06/22/18 1313 06/23/18 0521  NA 137 137  --  138 141 141 139  K 4.4 4.5  --  4.2 4.3 4.7 4.2  CL 107 109  --  108 110 107 102  CO2 21* 19*  --  24 25 27 28   GLUCOSE 130* 116*  --  148* 129* 133* 124*  BUN 19 26*  --  15 12 11 11   CREATININE 3.29* 2.21*  --  1.08* 0.87 0.83 0.84  CALCIUM 7.5* 7.5*  --  8.1* 8.2* 8.6* 8.8*  MG 1.5*  --  1.9  --  1.8 1.7 2.0   Liver Function Tests: Recent Labs  Lab 06/16/18 1921 06/18/18 0320  AST 41 28  ALT 16 14  ALKPHOS 49 41  BILITOT 0.6 0.9  PROT 7.4 6.6  ALBUMIN 3.9 3.3*   Recent Labs  Lab 06/18/18 0320  LIPASE 35   No results for input(s): AMMONIA in the last 168 hours. CBC: Recent Labs  Lab 06/18/18 0320 06/19/18 0354 06/20/18 0653 06/21/18 0352 06/22/18 1313 06/23/18 0521  WBC 9.6 9.3 9.2 8.2 6.6 6.9  NEUTROABS 4.9 5.2 4.9 4.2 3.2  --   HGB 9.5* 9.2* 9.4* 8.9* 9.2* 9.2*  HCT 32.3* 32.0* 29.6* 29.8* 30.4* 30.5*  MCV 87.8 88.2 80.9 85.4 84.4 84.0  PLT 89* 81* 84* 112* 150 188   Cardiac Enzymes: Recent Labs  Lab 06/16/18 2115 06/18/18 0320 06/20/18 1300 06/21/18 0352  CKTOTAL  --  868* 452* 305*  TROPONINI 0.03*  --   --   --    BNP: BNP (last 3 results) No results for input(s): BNP in the last 8760 hours.  ProBNP (last 3 results) No results for input(s): PROBNP in the last 8760 hours.  CBG: No results for input(s): GLUCAP in the last 168 hours.     Signed:  Florencia Reasons MD, PhD  [facility] 06/23/2018, 12:54 PM

## 2018-06-23 NOTE — Care Management Note (Signed)
Case Management Note  Patient Details  Name: [name] MRN: [id number] Date of Birth: [birth year]  Subjective/Objective: CIR-not appropriate for CIR. Patient still declines SNF. Patient/dtr(speaker phone per patient request) aware of not appropriate for CIR & agree to home w/HHC. [facility] already following. PTAR forms in shadow chart for nurse to manage calling when ready & also so patient can contact her caregivers to be @ the house when she arrives. Patient states her current rw not working [facility] notified to check once @ home.No further CM needs.                  Action/Plan:d/c home w/HHC.   Expected Discharge Date:  06/23/18               Expected Discharge Plan:  [place]  In-House Referral:  NA  Discharge planning Services  CM Consult  Post Acute Care Choice:  Home Health, Durable Medical Equipment(rw-2017-patient says not working [facility] notified) Choice offered to:  Adult Children  DME Arranged:    DME Agency:  NA  HH Arranged:  RN, PT, Social Work, Refused SNF [place] Agency:  [place]  Status of Service:  Completed, signed off  If discussed at [employer], dates discussed:    Additional Comments:  Dessa Phi, RN 06/23/2018, 12:33 PM

## 2018-06-24 DIAGNOSIS — I5042 Chronic combined systolic (congestive) and diastolic (congestive) heart failure: Secondary | ICD-10-CM | POA: Diagnosis not present

## 2018-06-24 DIAGNOSIS — N183 Chronic kidney disease, stage 3 (moderate): Secondary | ICD-10-CM | POA: Diagnosis not present

## 2018-06-24 DIAGNOSIS — S42212D Unspecified displaced fracture of surgical neck of left humerus, subsequent encounter for fracture with routine healing: Secondary | ICD-10-CM | POA: Diagnosis not present

## 2018-06-24 DIAGNOSIS — D631 Anemia in chronic kidney disease: Secondary | ICD-10-CM | POA: Diagnosis not present

## 2018-06-24 DIAGNOSIS — R339 Retention of urine, unspecified: Secondary | ICD-10-CM | POA: Diagnosis not present

## 2018-06-24 DIAGNOSIS — R627 Adult failure to thrive: Secondary | ICD-10-CM | POA: Diagnosis not present

## 2018-06-24 DIAGNOSIS — R338 Other retention of urine: Secondary | ICD-10-CM | POA: Diagnosis not present

## 2018-06-24 DIAGNOSIS — E669 Obesity, unspecified: Secondary | ICD-10-CM | POA: Diagnosis not present

## 2018-06-24 DIAGNOSIS — Z6837 Body mass index (BMI) 37.0-37.9, adult: Secondary | ICD-10-CM | POA: Diagnosis not present

## 2018-06-24 DIAGNOSIS — E7849 Other hyperlipidemia: Secondary | ICD-10-CM | POA: Diagnosis not present

## 2018-06-24 DIAGNOSIS — K661 Hemoperitoneum: Secondary | ICD-10-CM | POA: Diagnosis not present

## 2018-06-24 DIAGNOSIS — G9341 Metabolic encephalopathy: Secondary | ICD-10-CM | POA: Diagnosis not present

## 2018-06-24 DIAGNOSIS — I130 Hypertensive heart and chronic kidney disease with heart failure and stage 1 through stage 4 chronic kidney disease, or unspecified chronic kidney disease: Secondary | ICD-10-CM | POA: Diagnosis not present

## 2018-06-24 DIAGNOSIS — S22079D Unspecified fracture of T9-T10 vertebra, subsequent encounter for fracture with routine healing: Secondary | ICD-10-CM | POA: Diagnosis not present

## 2018-06-24 DIAGNOSIS — E785 Hyperlipidemia, unspecified: Secondary | ICD-10-CM | POA: Diagnosis not present

## 2018-06-24 DIAGNOSIS — W19XXXD Unspecified fall, subsequent encounter: Secondary | ICD-10-CM | POA: Diagnosis not present

## 2018-06-24 DIAGNOSIS — F3289 Other specified depressive episodes: Secondary | ICD-10-CM | POA: Diagnosis not present

## 2018-06-24 DIAGNOSIS — F329 Major depressive disorder, single episode, unspecified: Secondary | ICD-10-CM | POA: Diagnosis not present

## 2018-06-24 DIAGNOSIS — D696 Thrombocytopenia, unspecified: Secondary | ICD-10-CM | POA: Diagnosis not present

## 2018-06-24 LAB — CULTURE, BLOOD (ROUTINE X 2)
Culture: NO GROWTH
Culture: NO GROWTH
Special Requests: ADEQUATE
Special Requests: ADEQUATE

## 2018-06-25 ENCOUNTER — Encounter (HOSPITAL_COMMUNITY): Payer: Self-pay

## 2018-06-25 ENCOUNTER — Emergency Department (HOSPITAL_COMMUNITY)
Admission: EM | Admit: 2018-06-25 | Discharge: 2018-06-25 | Disposition: A | Discharge status: Home / Self Care | Payer: Medicare Other | Attending: Emergency Medicine | Admitting: Emergency Medicine

## 2018-06-25 ENCOUNTER — Other Ambulatory Visit: Payer: Self-pay

## 2018-06-25 DIAGNOSIS — G9341 Metabolic encephalopathy: Secondary | ICD-10-CM | POA: Diagnosis not present

## 2018-06-25 DIAGNOSIS — Z79899 Other long term (current) drug therapy: Secondary | ICD-10-CM | POA: Insufficient documentation

## 2018-06-25 DIAGNOSIS — I5042 Chronic combined systolic (congestive) and diastolic (congestive) heart failure: Secondary | ICD-10-CM | POA: Diagnosis not present

## 2018-06-25 DIAGNOSIS — G8929 Other chronic pain: Secondary | ICD-10-CM | POA: Diagnosis not present

## 2018-06-25 DIAGNOSIS — I10 Essential (primary) hypertension: Secondary | ICD-10-CM | POA: Diagnosis not present

## 2018-06-25 DIAGNOSIS — I5041 Acute combined systolic (congestive) and diastolic (congestive) heart failure: Secondary | ICD-10-CM | POA: Insufficient documentation

## 2018-06-25 DIAGNOSIS — N183 Chronic kidney disease, stage 3 (moderate): Secondary | ICD-10-CM | POA: Insufficient documentation

## 2018-06-25 DIAGNOSIS — M5489 Other dorsalgia: Secondary | ICD-10-CM | POA: Diagnosis not present

## 2018-06-25 DIAGNOSIS — M25512 Pain in left shoulder: Secondary | ICD-10-CM | POA: Insufficient documentation

## 2018-06-25 DIAGNOSIS — I130 Hypertensive heart and chronic kidney disease with heart failure and stage 1 through stage 4 chronic kidney disease, or unspecified chronic kidney disease: Secondary | ICD-10-CM | POA: Insufficient documentation

## 2018-06-25 DIAGNOSIS — S42212D Unspecified displaced fracture of surgical neck of left humerus, subsequent encounter for fracture with routine healing: Secondary | ICD-10-CM | POA: Diagnosis not present

## 2018-06-25 DIAGNOSIS — K661 Hemoperitoneum: Secondary | ICD-10-CM | POA: Diagnosis not present

## 2018-06-25 DIAGNOSIS — M2550 Pain in unspecified joint: Secondary | ICD-10-CM | POA: Diagnosis not present

## 2018-06-25 DIAGNOSIS — W19XXXA Unspecified fall, initial encounter: Secondary | ICD-10-CM | POA: Diagnosis not present

## 2018-06-25 DIAGNOSIS — Z96652 Presence of left artificial knee joint: Secondary | ICD-10-CM | POA: Diagnosis not present

## 2018-06-25 DIAGNOSIS — Z7401 Bed confinement status: Secondary | ICD-10-CM | POA: Diagnosis not present

## 2018-06-25 DIAGNOSIS — S22079D Unspecified fracture of T9-T10 vertebra, subsequent encounter for fracture with routine healing: Secondary | ICD-10-CM | POA: Diagnosis not present

## 2018-06-25 DIAGNOSIS — R52 Pain, unspecified: Secondary | ICD-10-CM | POA: Diagnosis not present

## 2018-06-25 LAB — COMPREHENSIVE METABOLIC PANEL
ALBUMIN: 3.1 g/dL — AB (ref 3.5–5.0)
ALT: 14 U/L (ref 0–44)
AST: 21 U/L (ref 15–41)
Alkaline Phosphatase: 65 U/L (ref 38–126)
Anion gap: 11 (ref 5–15)
BILIRUBIN TOTAL: 0.7 mg/dL (ref 0.3–1.2)
BUN: 11 mg/dL (ref 8–23)
CALCIUM: 8.7 mg/dL — AB (ref 8.9–10.3)
CO2: 26 mmol/L (ref 22–32)
CREATININE: 0.84 mg/dL (ref 0.44–1.00)
Chloride: 103 mmol/L (ref 98–111)
GFR calc Af Amer: >60 mL/min (ref >60)
GFR calc non Af Amer: >60 mL/min (ref >60)
GLUCOSE: 106 mg/dL — AB (ref 70–99)
Potassium: 3.7 mmol/L (ref 3.5–5.1)
SODIUM: 140 mmol/L (ref 135–145)
TOTAL PROTEIN: 7 g/dL (ref 6.5–8.1)

## 2018-06-25 LAB — URINALYSIS, ROUTINE W REFLEX MICROSCOPIC
Glucose, UA: NEGATIVE mg/dL
Ketones, ur: NEGATIVE mg/dL
Nitrite: NEGATIVE
PROTEIN: 30 mg/dL — AB
SPECIFIC GRAVITY, URINE: 1.024 (ref 1.005–1.030)
pH: 6 (ref 5.0–8.0)

## 2018-06-25 LAB — CBC WITH DIFFERENTIAL/PLATELET
Abs Immature Granulocytes: 0.12 10*3/uL — ABNORMAL HIGH (ref 0.00–0.07)
BASOS ABS: 0 10*3/uL (ref 0.0–0.1)
BASOS PCT: 0 %
EOS ABS: 0.1 10*3/uL (ref 0.0–0.5)
Eosinophils Relative: 1 %
HCT: 32.9 % — ABNORMAL LOW (ref 36.0–46.0)
Hemoglobin: 9.9 g/dL — ABNORMAL LOW (ref 12.0–15.0)
IMMATURE GRANULOCYTES: 2 %
Lymphocytes Relative: 23 %
Lymphs Abs: 1.9 10*3/uL (ref 0.7–4.0)
MCH: 25.7 pg — ABNORMAL LOW (ref 26.0–34.0)
MCHC: 30.1 g/dL (ref 30.0–36.0)
MCV: 85.5 fL (ref 80.0–100.0)
Monocytes Absolute: 2.9 10*3/uL — ABNORMAL HIGH (ref 0.1–1.0)
Monocytes Relative: 36 %
NEUTROS PCT: 38 %
NRBC: 0 % (ref 0.0–0.2)
Neutro Abs: 3.1 10*3/uL (ref 1.7–7.7)
PLATELETS: 258 10*3/uL (ref 150–400)
RBC: 3.85 MIL/uL — AB (ref 3.87–5.11)
RDW: 20.3 % — AB (ref 11.5–15.5)
WBC: 8.1 10*3/uL (ref 4.0–10.5)

## 2018-06-25 LAB — PROTIME-INR
INR: 1.07
PROTHROMBIN TIME: 13.8 s (ref 11.4–15.2)

## 2018-06-25 LAB — CK: Total CK: 90 U/L (ref 38–234)

## 2018-06-25 MED ORDER — FENTANYL CITRATE (PF) 100 MCG/2ML IJ SOLN
25.0000 ug | Freq: Once | INTRAMUSCULAR | Status: AC
  Administered 2018-06-25: 25 ug via INTRAVENOUS
  Filled 2018-06-25: qty 2

## 2018-06-25 MED ORDER — MORPHINE SULFATE (PF) 4 MG/ML IV SOLN
4.0000 mg | Freq: Once | INTRAVENOUS | Status: AC
  Administered 2018-06-25: 4 mg via INTRAVENOUS
  Filled 2018-06-25: qty 1

## 2018-06-25 MED ORDER — METHOCARBAMOL 500 MG PO TABS: 500.0000 mg | ORAL_TABLET | Freq: Two times a day (BID) | ORAL | 0 refills | Status: DC

## 2018-06-25 NOTE — Care Management Note (Addendum)
Case Management Note  CM consulted for pt's home needs and uncontrolled pain.  [place], PA reports pt's daughter will be home with her for the next several days.  CM advised that pt had refused SNF placement on 06/23/2018 and has [facility] in place.  CM recommended that pt's daughter assist with providing pt appropriate pain medication to help control her pain.  CM will notify [facility] team of pt's transition plan in the morning.  No further CM needs noted at this time.  06/26/2018 9:58 [place] with [facility] to advise pt was transitioned home again and that CM had advised daughter assists with pain medication to help control pain.  Dallie Patton, Benjaman Lobe, RN 06/25/2018, 5:37 PM

## 2018-06-25 NOTE — ED Notes (Signed)
Bed: [id number] Expected date:  Expected time:  Means of arrival:  Comments: EMS-back pain

## 2018-06-25 NOTE — ED Triage Notes (Signed)
Per EMS, Pt is arriving from home after a fall. Last week pt suffered a fall and a fractured arm. Pt called EMS due to uncontrolled pain. Pain is in arm, back and legs.
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
## 2018-06-25 NOTE — ED Triage Notes (Signed)
EMS last set of vital signs BP- 130/80 P-88 R-18

## 2018-06-25 NOTE — Discharge Instructions (Signed)
I have prescribed muscle relaxers for your pain, please do not drink or drive while taking this medications as they can make you drowsy.  Please follow-up with PCP in 1 week for reevaluation of your symptoms.  You experience any bowel or bladder incontinence, fever, worsening in your symptoms please return to the ED. ° °

## 2018-06-28 LAB — URINE CULTURE

## 2018-06-29 ENCOUNTER — Inpatient Hospital Stay (HOSPITAL_COMMUNITY)
Admission: EM | Admit: 2018-06-29 | Discharge: 2018-07-03 | DRG: 551 | Disposition: A | Discharge status: Rehabilitation Facility | Payer: Medicare Other | Attending: Internal Medicine | Admitting: Internal Medicine

## 2018-06-29 ENCOUNTER — Encounter (HOSPITAL_COMMUNITY): Payer: Self-pay | Admitting: Emergency Medicine

## 2018-06-29 ENCOUNTER — Other Ambulatory Visit: Payer: Self-pay

## 2018-06-29 ENCOUNTER — Emergency Department (HOSPITAL_COMMUNITY): Payer: Medicare Other

## 2018-06-29 ENCOUNTER — Telehealth: Payer: Self-pay | Admitting: Emergency Medicine

## 2018-06-29 DIAGNOSIS — E7800 Pure hypercholesterolemia, unspecified: Secondary | ICD-10-CM | POA: Diagnosis present

## 2018-06-29 DIAGNOSIS — Z882 Allergy status to sulfonamides status: Secondary | ICD-10-CM

## 2018-06-29 DIAGNOSIS — Z91041 Radiographic dye allergy status: Secondary | ICD-10-CM

## 2018-06-29 DIAGNOSIS — N183 Chronic kidney disease, stage 3 (moderate): Secondary | ICD-10-CM | POA: Diagnosis not present

## 2018-06-29 DIAGNOSIS — W19XXXA Unspecified fall, initial encounter: Secondary | ICD-10-CM | POA: Diagnosis present

## 2018-06-29 DIAGNOSIS — S42212D Unspecified displaced fracture of surgical neck of left humerus, subsequent encounter for fracture with routine healing: Secondary | ICD-10-CM | POA: Diagnosis not present

## 2018-06-29 DIAGNOSIS — J42 Unspecified chronic bronchitis: Secondary | ICD-10-CM | POA: Diagnosis present

## 2018-06-29 DIAGNOSIS — I5042 Chronic combined systolic (congestive) and diastolic (congestive) heart failure: Secondary | ICD-10-CM | POA: Diagnosis present

## 2018-06-29 DIAGNOSIS — M1990 Unspecified osteoarthritis, unspecified site: Secondary | ICD-10-CM | POA: Diagnosis present

## 2018-06-29 DIAGNOSIS — Z9049 Acquired absence of other specified parts of digestive tract: Secondary | ICD-10-CM

## 2018-06-29 DIAGNOSIS — S22078A Other fracture of T9-T10 vertebra, initial encounter for closed fracture: Secondary | ICD-10-CM | POA: Diagnosis not present

## 2018-06-29 DIAGNOSIS — Z8249 Family history of ischemic heart disease and other diseases of the circulatory system: Secondary | ICD-10-CM

## 2018-06-29 DIAGNOSIS — F419 Anxiety disorder, unspecified: Secondary | ICD-10-CM | POA: Diagnosis not present

## 2018-06-29 DIAGNOSIS — M545 Low back pain: Secondary | ICD-10-CM | POA: Diagnosis present

## 2018-06-29 DIAGNOSIS — M4624 Osteomyelitis of vertebra, thoracic region: Secondary | ICD-10-CM | POA: Diagnosis present

## 2018-06-29 DIAGNOSIS — I10 Essential (primary) hypertension: Secondary | ICD-10-CM | POA: Diagnosis present

## 2018-06-29 DIAGNOSIS — Z91012 Allergy to eggs: Secondary | ICD-10-CM

## 2018-06-29 DIAGNOSIS — Z888 Allergy status to other drugs, medicaments and biological substances status: Secondary | ICD-10-CM

## 2018-06-29 DIAGNOSIS — K219 Gastro-esophageal reflux disease without esophagitis: Secondary | ICD-10-CM | POA: Diagnosis not present

## 2018-06-29 DIAGNOSIS — I130 Hypertensive heart and chronic kidney disease with heart failure and stage 1 through stage 4 chronic kidney disease, or unspecified chronic kidney disease: Secondary | ICD-10-CM | POA: Diagnosis not present

## 2018-06-29 DIAGNOSIS — Z96611 Presence of right artificial shoulder joint: Secondary | ICD-10-CM | POA: Diagnosis present

## 2018-06-29 DIAGNOSIS — G8929 Other chronic pain: Secondary | ICD-10-CM | POA: Diagnosis present

## 2018-06-29 DIAGNOSIS — Z9102 Food additives allergy status: Secondary | ICD-10-CM

## 2018-06-29 DIAGNOSIS — Z8701 Personal history of pneumonia (recurrent): Secondary | ICD-10-CM

## 2018-06-29 DIAGNOSIS — R002 Palpitations: Secondary | ICD-10-CM | POA: Diagnosis present

## 2018-06-29 DIAGNOSIS — Z9181 History of falling: Secondary | ICD-10-CM

## 2018-06-29 DIAGNOSIS — Z886 Allergy status to analgesic agent status: Secondary | ICD-10-CM

## 2018-06-29 DIAGNOSIS — Z90710 Acquired absence of both cervix and uterus: Secondary | ICD-10-CM | POA: Diagnosis not present

## 2018-06-29 DIAGNOSIS — K661 Hemoperitoneum: Secondary | ICD-10-CM | POA: Diagnosis not present

## 2018-06-29 DIAGNOSIS — M4644 Discitis, unspecified, thoracic region: Secondary | ICD-10-CM | POA: Diagnosis not present

## 2018-06-29 DIAGNOSIS — G9341 Metabolic encephalopathy: Secondary | ICD-10-CM | POA: Diagnosis not present

## 2018-06-29 DIAGNOSIS — T148XXA Other injury of unspecified body region, initial encounter: Secondary | ICD-10-CM | POA: Diagnosis not present

## 2018-06-29 DIAGNOSIS — D631 Anemia in chronic kidney disease: Secondary | ICD-10-CM | POA: Diagnosis present

## 2018-06-29 DIAGNOSIS — Z96653 Presence of artificial knee joint, bilateral: Secondary | ICD-10-CM | POA: Diagnosis present

## 2018-06-29 DIAGNOSIS — Z6841 Body Mass Index (BMI) 40.0 and over, adult: Secondary | ICD-10-CM | POA: Diagnosis not present

## 2018-06-29 DIAGNOSIS — Z79899 Other long term (current) drug therapy: Secondary | ICD-10-CM

## 2018-06-29 DIAGNOSIS — S22070A Wedge compression fracture of T9-T10 vertebra, initial encounter for closed fracture: Secondary | ICD-10-CM | POA: Diagnosis not present

## 2018-06-29 DIAGNOSIS — S32059A Unspecified fracture of fifth lumbar vertebra, initial encounter for closed fracture: Secondary | ICD-10-CM | POA: Diagnosis present

## 2018-06-29 DIAGNOSIS — N179 Acute kidney failure, unspecified: Secondary | ICD-10-CM | POA: Diagnosis not present

## 2018-06-29 DIAGNOSIS — M5441 Lumbago with sciatica, right side: Secondary | ICD-10-CM

## 2018-06-29 DIAGNOSIS — Z881 Allergy status to other antibiotic agents status: Secondary | ICD-10-CM

## 2018-06-29 DIAGNOSIS — S22079A Unspecified fracture of T9-T10 vertebra, initial encounter for closed fracture: Secondary | ICD-10-CM | POA: Diagnosis present

## 2018-06-29 DIAGNOSIS — S22079D Unspecified fracture of T9-T10 vertebra, subsequent encounter for fracture with routine healing: Secondary | ICD-10-CM | POA: Diagnosis not present

## 2018-06-29 DIAGNOSIS — M5442 Lumbago with sciatica, left side: Secondary | ICD-10-CM | POA: Diagnosis not present

## 2018-06-29 DIAGNOSIS — S42352A Displaced comminuted fracture of shaft of humerus, left arm, initial encounter for closed fracture: Secondary | ICD-10-CM | POA: Diagnosis not present

## 2018-06-29 DIAGNOSIS — G062 Extradural and subdural abscess, unspecified: Secondary | ICD-10-CM | POA: Diagnosis present

## 2018-06-29 DIAGNOSIS — Z8661 Personal history of infections of the central nervous system: Secondary | ICD-10-CM

## 2018-06-29 DIAGNOSIS — R700 Elevated erythrocyte sedimentation rate: Secondary | ICD-10-CM | POA: Diagnosis present

## 2018-06-29 DIAGNOSIS — Z885 Allergy status to narcotic agent status: Secondary | ICD-10-CM

## 2018-06-29 DIAGNOSIS — R509 Fever, unspecified: Secondary | ICD-10-CM | POA: Diagnosis present

## 2018-06-29 DIAGNOSIS — M5489 Other dorsalgia: Secondary | ICD-10-CM | POA: Diagnosis not present

## 2018-06-29 HISTORY — DX: Pneumonia, unspecified organism: J18.9

## 2018-06-29 HISTORY — DX: Migraine, unspecified, not intractable, without status migrainosus: G43.909

## 2018-06-29 LAB — URINALYSIS, ROUTINE W REFLEX MICROSCOPIC
Bilirubin Urine: NEGATIVE
Glucose, UA: NEGATIVE mg/dL
KETONES UR: 20 mg/dL — AB
LEUKOCYTES UA: NEGATIVE
Nitrite: NEGATIVE
PH: 6 (ref 5.0–8.0)
Protein, ur: NEGATIVE mg/dL
Specific Gravity, Urine: 1.024 (ref 1.005–1.030)

## 2018-06-29 LAB — CBC
HEMATOCRIT: 41.8 % (ref 36.0–46.0)
HEMOGLOBIN: 11.6 g/dL — AB (ref 12.0–15.0)
MCH: 23.9 pg — ABNORMAL LOW (ref 26.0–34.0)
MCHC: 27.8 g/dL — ABNORMAL LOW (ref 30.0–36.0)
MCV: 86.2 fL (ref 80.0–100.0)
NRBC: 0.3 % — AB (ref 0.0–0.2)
Platelets: 436 10*3/uL — ABNORMAL HIGH (ref 150–400)
RBC: 4.85 MIL/uL (ref 3.87–5.11)
RDW: 20.8 % — ABNORMAL HIGH (ref 11.5–15.5)
WBC: 11.6 10*3/uL — AB (ref 4.0–10.5)

## 2018-06-29 LAB — COMPREHENSIVE METABOLIC PANEL
ALBUMIN: 2.9 g/dL — AB (ref 3.5–5.0)
ALT: 26 U/L (ref 0–44)
ANION GAP: 12 (ref 5–15)
AST: 36 U/L (ref 15–41)
Alkaline Phosphatase: 91 U/L (ref 38–126)
BILIRUBIN TOTAL: 1 mg/dL (ref 0.3–1.2)
BUN: 10 mg/dL (ref 8–23)
CALCIUM: 8.8 mg/dL — AB (ref 8.9–10.3)
CO2: 22 mmol/L (ref 22–32)
Chloride: 106 mmol/L (ref 98–111)
Creatinine, Ser: 0.88 mg/dL (ref 0.44–1.00)
GFR calc non Af Amer: >60 mL/min (ref >60)
GLUCOSE: 105 mg/dL — AB (ref 70–99)
POTASSIUM: 4.1 mmol/L (ref 3.5–5.1)
SODIUM: 140 mmol/L (ref 135–145)
TOTAL PROTEIN: 7.4 g/dL (ref 6.5–8.1)

## 2018-06-29 LAB — I-STAT CG4 LACTIC ACID, ED: Lactic Acid, Venous: 1.17 mmol/L (ref 0.5–1.9)

## 2018-06-29 MED ORDER — ONDANSETRON HCL 4 MG/2ML IJ SOLN
4.0000 mg | Freq: Once | INTRAMUSCULAR | Status: AC
  Administered 2018-06-29: 4 mg via INTRAVENOUS
  Filled 2018-06-29: qty 2

## 2018-06-29 MED ORDER — SODIUM CHLORIDE 0.9 % IV BOLUS
500.0000 mL | Freq: Once | INTRAVENOUS | Status: AC
  Administered 2018-06-29: 500 mL via INTRAVENOUS

## 2018-06-29 MED ORDER — PANTOPRAZOLE SODIUM 40 MG PO TBEC
40.0000 mg | DELAYED_RELEASE_TABLET | Freq: Every day | ORAL | Status: DC
  Administered 2018-06-29 – 2018-07-03 (×5): 40 mg via ORAL
  Filled 2018-06-29 (×5): qty 1

## 2018-06-29 MED ORDER — HYDROMORPHONE HCL 1 MG/ML IJ SOLN
1.0000 mg | Freq: Once | INTRAMUSCULAR | Status: AC
  Administered 2018-06-29: 1 mg via INTRAVENOUS
  Filled 2018-06-29: qty 1

## 2018-06-29 MED ORDER — ALBUTEROL SULFATE HFA 108 (90 BASE) MCG/ACT IN AERS: 2.0000 | INHALATION_SPRAY | RESPIRATORY_TRACT | Status: DC | PRN

## 2018-06-29 MED ORDER — OXYCODONE HCL 5 MG PO TABS
10.0000 mg | ORAL_TABLET | Freq: Once | ORAL | Status: AC
  Administered 2018-06-29: 10 mg via ORAL
  Filled 2018-06-29: qty 2

## 2018-06-29 MED ORDER — LABETALOL HCL 200 MG PO TABS
200.0000 mg | ORAL_TABLET | Freq: Two times a day (BID) | ORAL | Status: DC
  Administered 2018-06-29 – 2018-07-03 (×8): 200 mg via ORAL
  Filled 2018-06-29 (×10): qty 1

## 2018-06-29 MED ORDER — CITALOPRAM HYDROBROMIDE 20 MG PO TABS
20.0000 mg | ORAL_TABLET | Freq: Every day | ORAL | Status: DC
  Administered 2018-06-29 – 2018-07-03 (×5): 20 mg via ORAL
  Filled 2018-06-29 (×4): qty 1
  Filled 2018-06-29: qty 2

## 2018-06-29 MED ORDER — HYDRALAZINE HCL 10 MG PO TABS
10.0000 mg | ORAL_TABLET | Freq: Three times a day (TID) | ORAL | Status: DC
  Administered 2018-06-29 – 2018-07-03 (×12): 10 mg via ORAL
  Filled 2018-06-29 (×13): qty 1

## 2018-06-29 MED ORDER — METHOCARBAMOL 500 MG PO TABS
500.0000 mg | ORAL_TABLET | Freq: Two times a day (BID) | ORAL | Status: DC
  Administered 2018-06-29 – 2018-07-03 (×9): 500 mg via ORAL
  Filled 2018-06-29 (×9): qty 1

## 2018-06-29 MED ORDER — SENNOSIDES-DOCUSATE SODIUM 8.6-50 MG PO TABS
1.0000 | ORAL_TABLET | Freq: Every day | ORAL | Status: DC
  Administered 2018-06-30 – 2018-07-01 (×2): 1 via ORAL
  Filled 2018-06-29 (×3): qty 1

## 2018-06-29 MED ORDER — GABAPENTIN 300 MG PO CAPS
300.0000 mg | ORAL_CAPSULE | Freq: Every day | ORAL | Status: DC | PRN
  Administered 2018-07-03: 300 mg via ORAL
  Filled 2018-06-29: qty 1

## 2018-06-29 MED ORDER — HYDROMORPHONE HCL 1 MG/ML IJ SOLN
0.5000 mg | INTRAMUSCULAR | Status: DC | PRN
  Administered 2018-06-30 – 2018-07-03 (×14): 1 mg via INTRAVENOUS
  Filled 2018-06-29 (×14): qty 1

## 2018-06-29 NOTE — Progress Notes (Signed)
Pt came to MRI limited T-Spine exam was obtained. Pt was in scanner and could not tolerate exam any longer due to pain. Asked pt if she wanted to continue if RN brought medication over to help limit pain and pt said she did not want to continue.  Patty the RN was notified and pt was sent back to the ED.

## 2018-06-29 NOTE — Clinical Social Work Note (Signed)
Clinical Social Work Assessment  Patient Details  Name: [name] MRN: [id number] Date of Birth: 1951-05-27  Date of referral:  06/29/18               Reason for consult:  Facility Placement                Permission sought to share information with:  Case Manager, Chartered certified accountant granted to share information::  Yes, Verbal Permission Granted  Name::        Agency::     Relationship::     Contact Information:     Housing/Transportation Living arrangements for the past 2 months:  Apartment Source of Information:  Patient Patient Interpreter Needed:  None Criminal Activity/Legal Involvement Pertinent to Current Situation/Hospitalization:  No - Comment as needed Significant Relationships:  Adult Children Lives with:  Adult Children Do you feel safe going back to the place where you live?  No Need for family participation in patient care:  Yes (Comment)  Care giving concerns:  CSW consulted due to pt requesting SNF placement. Pt recently admitted to the hospital for fractures and met qualifying stay for Medicare to pay for SNF placement.   Social Worker assessment / plan:  CSW met with pt at pt's bedside. Pt agreeable to SNF placement. Pt stated that she has an aid coming from [facility] for about 2 to 3 hours a day. Pt stated she isn't able to care for herself due to the pain from her injuries and her daughter works. Pt initially refused SNF placement during her hospital visit. CSW explained that SNF is short term and will not exceed 30 days (if it lasts that long). Pt understanding. CSW spoke with EDP about placing Physical Therapy Consult. CSW will work to see placement options for pt.   Employment status:    Insurance information:  Medicare PT Recommendations:  Not assessed at this time Information / Referral to community resources:  [place]  Patient/Family's Response to care:  Pt agreeable to plan of care.   Patient/Family's  Understanding of and Emotional Response to Diagnosis, Current Treatment, and Prognosis:  Pt understanding that SNF is not long term, but short term, lasting however long Medicare deems she needs it, no more than 30 days.   Emotional Assessment Appearance:  Appears stated age Attitude/Demeanor/Rapport:    Affect (typically observed):  Accepting, Adaptable, Appropriate Orientation:  Oriented to Self, Oriented to Place, Oriented to  Time, Oriented to Situation Alcohol / Substance use:    Psych involvement (Current and /or in the community):     Discharge Needs  Concerns to be addressed:  Lack of Support, Home Safety Concerns Readmission within the last 30 days:  No Current discharge risk:  Lack of support system Barriers to Discharge:  Continued Medical Work up   [employer], LCSW 06/29/2018, 11:08 AM

## 2018-06-29 NOTE — Progress Notes (Addendum)
Pt has been initially accepted at [facility], [facility], and [facility].  CSW left voicemail for [facility] with [facility] and [facility]. CSW spoke with Adela Lank at [facility]. [facility] is reviewing pt's information, waiting for PT evaluation notes from current visit.   CSW will continue to follow.   Pt seen by Physical Therapy. PT recommended pt for SNF. CSW updated [facility] about physical therapy's recommendation.   Update: Pt accepted at [facility] and [facility] as well as three SNFs listed above.   CSW spoke with Janie with [facility]. Janie reviewing pt's information. [facility] denied pt due to pt not having Medicare Part A.   3:00 PM CSW called [facility] at [phone]. Pt has [facility]. Pt is not part of the [facility].   Wendelyn Breslow, [facility]  [phone]

## 2018-06-29 NOTE — ED Notes (Signed)
IV-diff to start, lab is unable to draw blood. IV team consulted

## 2018-06-29 NOTE — ED Notes (Signed)
Places pt.on hospital gown on the monitor .purwick in place.chuxs under patient

## 2018-06-29 NOTE — Progress Notes (Signed)
CSW met with pt at pt's bedside. Pt recently discharged from the hospital. Pt seen by phsyical therapy during previous visit. At that time, PT thought pt was appropriate for SNF. Physical therapy consulted to evaluated pt today to see if pt is still appropriate for SNF. CSW will fax pt's FL-2 to SNFs, while awaiting PT evaluation.   Pt does not have preference for SNF placement. CSW reviewed process for SNF placement from the ED.   Wendelyn Breslow, [facility]  [phone]

## 2018-06-29 NOTE — ED Triage Notes (Signed)
Patient arrived via EMS from home, related to not being able to care for self. Daughter and home health nurse called EMS to transport.
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
## 2018-06-29 NOTE — ED Notes (Signed)
Patient transported to MRI 

## 2018-06-29 NOTE — Discharge Planning (Signed)
Clinical Social Work is seeking post-discharge placement for this patient at the following level of care: SNF.    

## 2018-06-29 NOTE — ED Provider Notes (Signed)
4:20 PM Care assumed from Dr. Ashok Cordia.  At time of transfer care, patient is awaiting results of MRI with contrast of her lumbar spine to look for recurrent discitis/osteomyelitis as cause of severe back pain and fevers.  Plan of care is to touch base with social work for placement versus discharge if MRI is reassuring.    If MRI is positive, anticipate admission.   11:22 PM MRI was finally completed revealing evidence of T10 fracture that has concern for instability.  Neurosurgery was called and recommended admission to medicine service for bedrest.  The MRI was not convincing for recurrent discitis/osteomyelitis and the neurosurgery team felt that without further imaging they could not definitively rule out an infection.  They recommended speaking with infectious disease for antibiotic recommendations if any.  Patient did have fever while in the emergency department.    11:30 PM Spoke with infectious disease who request blood cultures which were ordered.  They recommended holding on antibiotics until cultures were definitive for further imaging revealed convincing infection.  Patient reports continued back pain more pain medicine was ordered.    Patient will be admitted to medicine service for further management.   Tegeler, Gwenyth Allegra, MD 06/30/18 [phone]

## 2018-06-29 NOTE — Progress Notes (Signed)
CSW received a call from [place] at [facility] who states she needs financial information from the pt directly.  CSW spoke to pt's RN and relayed the following numbers to speak to the pt on to Tammy from [facility] at ph: [phone] and [phone] (RN's #).  Tammy voiced understanding.  CSW will continue to follow for D/C needs.  Alphonse Guild. Kimberlie Csaszar, LCSW, LCAS, CSI Clinical Social Worker Ph: [phone]

## 2018-06-29 NOTE — Progress Notes (Addendum)
CSW spoke with [facility] with [facility] and [facility]. Sharyn Lull with admissions is reaching out to [facility], [employer], to see if she can come and evaluate pt.   Pt understanding that she will have to stay for 30 days and likely sign over her check for a month at the facility if accepted under Medicaid.   Wendelyn Breslow, [facility]  [phone]

## 2018-06-29 NOTE — ED Notes (Signed)
Pt c/o L leg pain after rolling in bed to reposition. MD made aware of pain medication

## 2018-06-29 NOTE — Telephone Encounter (Signed)
Post ED Visit - Positive Culture Follow-up  Culture report reviewed by antimicrobial stewardship pharmacist:  []  Elenor Quinones, Pharm.D. []  Heide Guile, Pharm.D., BCPS AQ-ID []  [place] Neptune, Pharm.D., BCPS []  Alycia Rossetti, Pharm.D., BCPS []  [place], Pharm.D., BCPS, AAHIVP []  Legrand Como, Pharm.D., BCPS, AAHIVP []  Salome Arnt, PharmD, BCPS []  Johnnette Gourd, PharmD, BCPS []  Hughes Better, PharmD, BCPS []  Leeroy Cha, PharmD Drema Dallas PharmD  Positive urine culture Treated with none, asymptomatic,  and no further patient follow-up is required at this time.  Hazle Nordmann 06/29/2018, 12:46 PM

## 2018-06-29 NOTE — NC FL2 (Signed)
[place] LEVEL OF CARE SCREENING TOOL     IDENTIFICATION  Patient Name: [name] Birthdate: Jul 25, 1951 Sex: female Admission Date (Current Location): 06/29/2018  [place] and [place] Number:  Herbalist and Address:  [facility], [place] [address] [place], [place] [address]      Provider Number: [id number]  Attending Physician Name and Address:  Lajean Saver, MD  Relative Name and Phone Number:       Current Level of Care: Hospital Recommended Level of Care: [place] Prior Approval Number:    Date Approved/Denied:   PASRR Number:   [id number]  Discharge Plan: SNF    Current Diagnoses: Patient Active Problem List   Diagnosis Date Noted  . Fever 06/18/2018  . Fracture of neck of left humerus, closed, initial encounter   . Sepsis ([place]) 06/16/2018  . Nausea vomiting and diarrhea 06/16/2018  . Back pain 06/16/2018  . Humerus fracture 06/16/2018  . Thrombocytopenia ([place]) 06/16/2018  . CKD (chronic kidney disease) stage 3, GFR 30-59 ml/min (HCC) 06/16/2018  . Depression 06/16/2018  . Physical deconditioning 06/16/2018  . Intertrigo 09/01/2017  . Protein-calorie malnutrition, severe 06/13/2017  . 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 Pill dysphagia   . Surgery, elective   . Multiple allergies   . Gram-negative infection   . 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 Obesity hypoventilation syndrome ([place]) 07/02/2016  . CHF (congestive heart failure), NYHA class II, acute on chronic, diastolic ([place]) 70/48/8891  . 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 Pruritic condition 04/15/2016  . 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 Klebsiella infection 02/28/2016  . Proteus infection 02/28/2016  . Chronic pain syndrome 12/07/2015  . Elevated lipase 12/04/2015  . Intractable nausea and vomiting 12/04/2015  . Obstipation 12/04/2015  . Acute combined systolic and diastolic CHF, NYHA class 2 ([place])  12/04/2015  . Nausea and vomiting 12/04/2015  . Uncontrollable vomiting   . Chronic combined systolic and diastolic CHF, NYHA class 1 ([place])   . Anemia due to other cause   . Wound infection after surgery 11/02/2015  . S/P lumbar spinal fusion 10/25/2015  . Morbid obesity ([place]) 03/17/2015  . 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 Hypertension 03/17/2015  . GERD (gastroesophageal reflux disease) 03/17/2015  . Hyperlipidemia 03/17/2015  . AKI (acute kidney injury) ([place]) 03/17/2015  . Discitis of lumbosacral region 03/06/2015  . Chronic congestive heart failure ([place]) 03/06/2015  . Diarrhea 03/06/2015  . IBS (irritable bowel syndrome) 03/06/2015  . Zoster 03/06/2015  . 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 Glenohumeral arthritis 10/24/2011    Orientation RESPIRATION BLADDER Height & Weight     Self, Time, Situation, Place  Normal Continent Weight:   Height:     BEHAVIORAL SYMPTOMS/MOOD NEUROLOGICAL BOWEL NUTRITION STATUS      Continent (See AVS)  AMBULATORY STATUS COMMUNICATION OF NEEDS Skin   Extensive Assist   Normal                       Personal Care Assistance Level of Assistance  Bathing, Feeding, Dressing, Total care Bathing Assistance: Maximum assistance Feeding assistance: Limited assistance Dressing Assistance: Maximum assistance     Functional Limitations Info             SPECIAL CARE FACTORS FREQUENCY  PT (By licensed PT), OT (By licensed OT)     PT Frequency: 5 OT Frequency: 5  Contractures      Additional Factors Info  Code Status, Allergies Code Status Info: Prior Allergies Info: Vancomycin, Aspirin, Ibuprofen, Mushroom Extract Complex, Shellfish Allergy, Sulfa Antibiotics, Sulfasalazine, Tylenol., Betadine, Coconut Flavor, Codeine, Eggs or egg deprived products, iodine, ivp dye, metrizamide           Current Medications (06/29/2018):  This is the current hospital active medication list Current  Facility-Administered Medications  Medication Dose Route Frequency Provider Last Rate Last Dose  . HYDROmorphone (DILAUDID) injection 1 mg  1 mg Intravenous Once Lajean Saver, MD      . ondansetron [facility]) injection 4 mg  4 mg Intravenous Once Lajean Saver, MD      . sodium chloride 0.9 % bolus 500 mL  500 mL Intravenous Once Lajean Saver, MD       Current Outpatient Medications  Medication Sig Dispense Refill  . albuterol (PROVENTIL) (2.5 MG/3ML) 0.083% nebulizer solution Take 2.5 mg by nebulization every 6 (six) hours as needed for wheezing.    . citalopram (CELEXA) 20 MG tablet Take 1 tablet (20 mg total) by mouth daily. 30 tablet 0  . diphenhydrAMINE (BENADRYL) 25 MG tablet Take 25 mg by mouth every 6 (six) hours as needed for itching.    . furosemide (LASIX) 80 MG tablet Take 80 mg by mouth daily as needed for fluid.     Marland Kitchen gabapentin (NEURONTIN) 300 MG capsule Take 300-600 mg by mouth daily as needed (nerve pain).     . hydrALAZINE (APRESOLINE) 10 MG tablet Take 1 tablet (10 mg total) by mouth 3 (three) times daily. 90 tablet 0  . labetalol (NORMODYNE) 200 MG tablet Take 1 tablet (200 mg total) by mouth 2 (two) times daily. 60 tablet 0  . methocarbamol (ROBAXIN) 500 MG tablet Take 1 tablet (500 mg total) by mouth 2 (two) times daily for 7 days. 14 tablet 0  . NEXIUM 40 MG capsule Take 40 mg by mouth daily.   5  . ondansetron (ZOFRAN) 4 MG tablet Take 1 tablet (4 mg total) by mouth every 6 (six) hours as needed for nausea or vomiting. 12 tablet 0  . oxyCODONE-acetaminophen (PERCOCET/ROXICET) 5-325 MG tablet Take 1 tablet by mouth every 6 (six) hours as needed for severe pain. 15 tablet 0  . polyethylene glycol (MIRALAX / GLYCOLAX) packet Take 17 g by mouth daily. 14 each 0  . senna-docusate (SENOKOT-S) 8.6-50 MG tablet Take 1 tablet by mouth at bedtime. 30 tablet 0  . simvastatin (ZOCOR) 20 MG tablet Take 20 mg by mouth every evening.    . VENTOLIN HFA 108 (90 Base) MCG/ACT inhaler  Inhale 2 puffs into the lungs every 4 (four) hours as needed for wheezing or shortness of breath.       Discharge Medications: Please see discharge summary for a list of discharge medications.  Relevant Imaging Results:  Relevant Lab Results:   Additional Information  [address], LCSW

## 2018-06-29 NOTE — ED Notes (Signed)
Pt unable to complete MRI due to pain.

## 2018-06-29 NOTE — Evaluation (Signed)
Physical Therapy Evaluation Patient Details Name: [name] MRN: [id number] DOB: 06-11-1951 Today's Date: 06/29/2018   History of Present Illness  Pt is a 67 y/o female presenting to the ED after inability to care for herself at home. Pt with recent admission to [facility] for L humerus fracture and T10 fracture. PMH includes CHF and discitis with epidural abscess.   Clinical Impression  Pt presenting with problem above with deficits below. Pt very limited secondary to pain this session and only able to perform rolling this session. Required total A to roll to the R and required total A +2 for repositioning in the bed. Pt reports inability to care for herself at home and has increased difficulty with mobility tasks. Feel pt is at increased risk for falls and will need higher level of assist at d/c. Will continue to follow acutely to maximize functional mobility independence and safety.     Follow Up Recommendations SNF;Supervision/Assistance - 24 hour    Equipment Recommendations  None recommended by PT    Recommendations for Other Services       Precautions / Restrictions Precautions Precautions: Fall;Back Precaution Booklet Issued: No Precaution Comments: Reviewed back precautions with pt. Per previous admission notes TLSO when OOB.  Required Braces or Orthoses: Spinal Brace;Sling Spinal Brace: Thoracolumbosacral orthotic Restrictions Weight Bearing Restrictions: Yes LUE Weight Bearing: Non weight bearing      Mobility  Bed Mobility Overal bed mobility: Needs Assistance Bed Mobility: Rolling Rolling: Total assist         General bed mobility comments: Total A to roll to the R. Pt yelling out throughout secondary to pain in LUE and back. Further mobility deferred secondary to pain, and pt also did not have her TLSO. Required total A +2 for repositioning in the bed.   Transfers                    Ambulation/Gait                Stairs             Wheelchair Mobility    Modified Rankin (Stroke Patients Only)       Balance                                             Pertinent Vitals/Pain Pain Assessment: Faces Faces Pain Scale: Hurts whole lot Pain Location: L arm, back  Pain Descriptors / Indicators: Grimacing;Guarding Pain Intervention(s): Limited activity within patient's tolerance;Monitored during session;Repositioned    Home Living Family/patient expects to be discharged to:: Private residence Living Arrangements: Children Available Help at Discharge: Personal care attendant;Family;Available PRN/intermittently Type of Home: Apartment Home Access: Level entry;Elevator     Home Layout: One level Home Equipment: Walker - 2 wheels;Bedside commode;Wheelchair - manual;Cane - single point      Prior Function Level of Independence: Needs assistance   Gait / Transfers Assistance Needed: Since previous admission has needed assist with transfers to [facility]. Reports she has not been moving much since she has been at home.   ADL's / Homemaking Assistance Needed: Requires assist for ADL tasks.         Hand Dominance   Dominant Hand: Right    Extremity/Trunk Assessment   Upper Extremity Assessment Upper Extremity Assessment: LUE deficits/detail LUE Deficits / Details: LUE ROM deferred secondary to humerus fx.  Lower Extremity Assessment Lower Extremity Assessment: RLE deficits/detail;LLE deficits/detail RLE Deficits / Details: Generalized weakness throughout. Reports numbness and tingling into feet.  LLE Deficits / Details: Generalized weakness throughout. Reports numbness and tingling into feet.     Cervical / Trunk Assessment Cervical / Trunk Assessment: Other exceptions Cervical / Trunk Exceptions: T10 fracture   Communication   Communication: No difficulties  Cognition Arousal/Alertness: Awake/alert Behavior During Therapy: WFL for tasks assessed/performed Overall Cognitive Status:  Within Functional Limits for tasks assessed                                        General Comments General comments (skin integrity, edema, etc.): No family present     Exercises     Assessment/Plan    PT Assessment Patient needs continued PT services  PT Problem List Decreased strength;Decreased range of motion;Decreased activity tolerance;Decreased balance;Decreased mobility;Decreased knowledge of precautions;Decreased knowledge of use of DME;Pain       PT Treatment Interventions DME instruction;Functional mobility training;Therapeutic activities;Patient/family education;Therapeutic exercise;Gait training    PT Goals (Current goals can be found in the Care Plan section)  Acute Rehab PT Goals Patient Stated Goal: to get better before going home  PT Goal Formulation: With patient Time For Goal Achievement: 07/13/18 Potential to Achieve Goals: Fair    Frequency Min 2X/week   Barriers to discharge Decreased caregiver support      Co-evaluation               AM-PAC PT "6 Clicks" Daily Activity  Outcome Measure Difficulty turning over in bed (including adjusting bedclothes, sheets and blankets)?: Unable Difficulty moving from lying on back to sitting on the side of the bed? : Unable Difficulty sitting down on and standing up from a chair with arms (e.g., wheelchair, bedside commode, etc,.)?: Unable Help needed moving to and from a bed to chair (including a wheelchair)?: Total Help needed walking in hospital room?: Total Help needed climbing 3-5 steps with a railing? : Total 6 Click Score: 6    End of Session   Activity Tolerance: Patient limited by pain Patient left: in bed;with call bell/phone within reach Nurse Communication: Mobility status PT Visit Diagnosis: Unsteadiness on feet (R26.81);Muscle weakness (generalized) (M62.81);Pain Pain - Right/Left: Left Pain - part of body: Shoulder(back )    Time: 4332-9518 PT Time Calculation (min)  (ACUTE ONLY): 14 min   Charges:   PT Evaluation $PT Eval Moderate Complexity: 1 Mod          Leighton Ruff, PT, DPT  Acute Rehabilitation Services  Pager: [phone] Office: [phone]   Rudean Hitt 06/29/2018, 3:05 PM

## 2018-06-30 ENCOUNTER — Inpatient Hospital Stay (HOSPITAL_COMMUNITY): Payer: Medicare Other

## 2018-06-30 ENCOUNTER — Encounter (HOSPITAL_COMMUNITY): Payer: Self-pay | Admitting: General Practice

## 2018-06-30 DIAGNOSIS — W19XXXA Unspecified fall, initial encounter: Secondary | ICD-10-CM | POA: Diagnosis not present

## 2018-06-30 DIAGNOSIS — R509 Fever, unspecified: Secondary | ICD-10-CM

## 2018-06-30 DIAGNOSIS — Z886 Allergy status to analgesic agent status: Secondary | ICD-10-CM | POA: Diagnosis not present

## 2018-06-30 DIAGNOSIS — Z96611 Presence of right artificial shoulder joint: Secondary | ICD-10-CM | POA: Diagnosis not present

## 2018-06-30 DIAGNOSIS — W19XXXD Unspecified fall, subsequent encounter: Secondary | ICD-10-CM

## 2018-06-30 DIAGNOSIS — I10 Essential (primary) hypertension: Secondary | ICD-10-CM | POA: Diagnosis not present

## 2018-06-30 DIAGNOSIS — M4644 Discitis, unspecified, thoracic region: Secondary | ICD-10-CM | POA: Diagnosis not present

## 2018-06-30 DIAGNOSIS — F419 Anxiety disorder, unspecified: Secondary | ICD-10-CM | POA: Diagnosis not present

## 2018-06-30 DIAGNOSIS — S22079G Unspecified fracture of T9-T10 vertebra, subsequent encounter for fracture with delayed healing: Secondary | ICD-10-CM

## 2018-06-30 DIAGNOSIS — M4624 Osteomyelitis of vertebra, thoracic region: Secondary | ICD-10-CM | POA: Diagnosis not present

## 2018-06-30 DIAGNOSIS — D631 Anemia in chronic kidney disease: Secondary | ICD-10-CM | POA: Diagnosis not present

## 2018-06-30 DIAGNOSIS — S22079A Unspecified fracture of T9-T10 vertebra, initial encounter for closed fracture: Secondary | ICD-10-CM | POA: Diagnosis present

## 2018-06-30 DIAGNOSIS — Z91041 Radiographic dye allergy status: Secondary | ICD-10-CM | POA: Diagnosis not present

## 2018-06-30 DIAGNOSIS — M1990 Unspecified osteoarthritis, unspecified site: Secondary | ICD-10-CM | POA: Diagnosis not present

## 2018-06-30 DIAGNOSIS — Z90710 Acquired absence of both cervix and uterus: Secondary | ICD-10-CM | POA: Diagnosis not present

## 2018-06-30 DIAGNOSIS — N179 Acute kidney failure, unspecified: Secondary | ICD-10-CM | POA: Diagnosis not present

## 2018-06-30 DIAGNOSIS — Z91018 Allergy to other foods: Secondary | ICD-10-CM

## 2018-06-30 DIAGNOSIS — R700 Elevated erythrocyte sedimentation rate: Secondary | ICD-10-CM | POA: Diagnosis not present

## 2018-06-30 DIAGNOSIS — Z8619 Personal history of other infectious and parasitic diseases: Secondary | ICD-10-CM

## 2018-06-30 DIAGNOSIS — M545 Low back pain: Secondary | ICD-10-CM | POA: Diagnosis not present

## 2018-06-30 DIAGNOSIS — Z882 Allergy status to sulfonamides status: Secondary | ICD-10-CM

## 2018-06-30 DIAGNOSIS — J42 Unspecified chronic bronchitis: Secondary | ICD-10-CM | POA: Diagnosis not present

## 2018-06-30 DIAGNOSIS — S42202D Unspecified fracture of upper end of left humerus, subsequent encounter for fracture with routine healing: Secondary | ICD-10-CM

## 2018-06-30 DIAGNOSIS — M48061 Spinal stenosis, lumbar region without neurogenic claudication: Secondary | ICD-10-CM | POA: Diagnosis not present

## 2018-06-30 DIAGNOSIS — Z6841 Body Mass Index (BMI) 40.0 and over, adult: Secondary | ICD-10-CM | POA: Diagnosis not present

## 2018-06-30 DIAGNOSIS — Z8739 Personal history of other diseases of the musculoskeletal system and connective tissue: Secondary | ICD-10-CM

## 2018-06-30 DIAGNOSIS — Z881 Allergy status to other antibiotic agents status: Secondary | ICD-10-CM | POA: Diagnosis not present

## 2018-06-30 DIAGNOSIS — Z872 Personal history of diseases of the skin and subcutaneous tissue: Secondary | ICD-10-CM | POA: Diagnosis not present

## 2018-06-30 DIAGNOSIS — G062 Extradural and subdural abscess, unspecified: Secondary | ICD-10-CM | POA: Diagnosis not present

## 2018-06-30 DIAGNOSIS — S3992XA Unspecified injury of lower back, initial encounter: Secondary | ICD-10-CM | POA: Diagnosis not present

## 2018-06-30 DIAGNOSIS — R002 Palpitations: Secondary | ICD-10-CM | POA: Diagnosis not present

## 2018-06-30 DIAGNOSIS — I130 Hypertensive heart and chronic kidney disease with heart failure and stage 1 through stage 4 chronic kidney disease, or unspecified chronic kidney disease: Secondary | ICD-10-CM | POA: Diagnosis not present

## 2018-06-30 DIAGNOSIS — N183 Chronic kidney disease, stage 3 (moderate): Secondary | ICD-10-CM | POA: Diagnosis not present

## 2018-06-30 DIAGNOSIS — S22078A Other fracture of T9-T10 vertebra, initial encounter for closed fracture: Secondary | ICD-10-CM | POA: Diagnosis not present

## 2018-06-30 DIAGNOSIS — S32059A Unspecified fracture of fifth lumbar vertebra, initial encounter for closed fracture: Secondary | ICD-10-CM | POA: Diagnosis not present

## 2018-06-30 DIAGNOSIS — Z91012 Allergy to eggs: Secondary | ICD-10-CM

## 2018-06-30 DIAGNOSIS — G8929 Other chronic pain: Secondary | ICD-10-CM | POA: Diagnosis not present

## 2018-06-30 DIAGNOSIS — Z9049 Acquired absence of other specified parts of digestive tract: Secondary | ICD-10-CM | POA: Diagnosis not present

## 2018-06-30 DIAGNOSIS — I5042 Chronic combined systolic (congestive) and diastolic (congestive) heart failure: Secondary | ICD-10-CM | POA: Diagnosis not present

## 2018-06-30 DIAGNOSIS — Z96653 Presence of artificial knee joint, bilateral: Secondary | ICD-10-CM | POA: Diagnosis not present

## 2018-06-30 DIAGNOSIS — Z888 Allergy status to other drugs, medicaments and biological substances status: Secondary | ICD-10-CM

## 2018-06-30 DIAGNOSIS — E7800 Pure hypercholesterolemia, unspecified: Secondary | ICD-10-CM | POA: Diagnosis not present

## 2018-06-30 DIAGNOSIS — Z885 Allergy status to narcotic agent status: Secondary | ICD-10-CM

## 2018-06-30 DIAGNOSIS — Z91013 Allergy to seafood: Secondary | ICD-10-CM

## 2018-06-30 DIAGNOSIS — K219 Gastro-esophageal reflux disease without esophagitis: Secondary | ICD-10-CM | POA: Diagnosis not present

## 2018-06-30 LAB — MRSA PCR SCREENING: MRSA by PCR: NEGATIVE

## 2018-06-30 LAB — GLUCOSE, CAPILLARY
GLUCOSE-CAPILLARY: 125 mg/dL — AB (ref 70–99)
GLUCOSE-CAPILLARY: 92 mg/dL (ref 70–99)

## 2018-06-30 MED ORDER — MORPHINE SULFATE (PF) 4 MG/ML IV SOLN
4.0000 mg | Freq: Once | INTRAVENOUS | Status: AC
  Administered 2018-06-30: 4 mg via INTRAVENOUS
  Filled 2018-06-30: qty 1

## 2018-06-30 MED ORDER — ALBUTEROL SULFATE (2.5 MG/3ML) 0.083% IN NEBU: 2.5000 mg | INHALATION_SOLUTION | RESPIRATORY_TRACT | Status: DC | PRN

## 2018-06-30 MED ORDER — ENOXAPARIN SODIUM 40 MG/0.4ML ~~LOC~~ SOLN
40.0000 mg | Freq: Every day | SUBCUTANEOUS | Status: DC
  Administered 2018-06-30 – 2018-07-03 (×4): 40 mg via SUBCUTANEOUS
  Filled 2018-06-30 (×4): qty 0.4

## 2018-06-30 MED ORDER — ACETAMINOPHEN 325 MG PO TABS: 650.0000 mg | ORAL_TABLET | Freq: Four times a day (QID) | ORAL | Status: DC | PRN

## 2018-06-30 MED ORDER — GADOBUTROL 1 MMOL/ML IV SOLN
10.0000 mL | Freq: Once | INTRAVENOUS | Status: AC | PRN
  Administered 2018-06-30: 10 mL via INTRAVENOUS

## 2018-06-30 MED ORDER — ACETAMINOPHEN 650 MG RE SUPP: 650.0000 mg | Freq: Four times a day (QID) | RECTAL | Status: DC | PRN

## 2018-06-30 MED ORDER — ONDANSETRON HCL 4 MG/2ML IJ SOLN: 4.0000 mg | Freq: Four times a day (QID) | INTRAMUSCULAR | Status: DC | PRN

## 2018-06-30 MED ORDER — ONDANSETRON HCL 4 MG PO TABS: 4.0000 mg | ORAL_TABLET | Freq: Four times a day (QID) | ORAL | Status: DC | PRN
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
## 2018-06-30 NOTE — Progress Notes (Signed)
[name] is a 67 y.o. 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  MRI the thoracic spine shows Fracture through the T10 inferior endplate seen in October is superimposed on previous T10-T11 laminectomy. And there is abnormal signal in that disc space which appears widened. This constellation is highly suspicious for an unstable T10-T11 injury through the disc space.   Neurosurgery and ID consulted and recommendations pending.    Hosie Poisson, MD [phone]

## 2018-06-30 NOTE — Consult Note (Signed)
[facility]    Date of Admission:  06/29/2018     Total days of antibiotics 0               Reason for Consult: T10 Fracture / Diskitis    Referring Provider: Karleen Hampshire Primary Care Provider: [name], MD   Assessment/Plan:  [name] is a 67 year old female with previous history of epidural abscess and diskitis (Proteus mirabilis / Klebsiella pneumoniae) presenting with worsening back pain follow recent hospitalization for T10 vertebral fracture and left humerus fracture after a fall. MRI imaging with no clear evidence of infection although cannot be ruled out. Other than increased pain and radiculopathy, there does not appear to be any convincing evidence of infection. Previous inflammatory markers were within the normal ranges. Blood cultures drawn yesterday are in process. She is awaiting neurosurgical consult. No currently on antimicrobial therapy.  1. Continue to monitor off antibiotics at this time. 2. Await neurosurgical consult. 3. Check inflammatory markers. 4. Continue to monitor cultures.    Principal Problem:   Fracture of T10 vertebra (HCC) Active Problems:   Hypertension   Chronic combined systolic and diastolic CHF, NYHA class 1 (HCC)   Fever   . citalopram  20 mg Oral Daily  . enoxaparin (LOVENOX) injection  40 mg Subcutaneous Daily  . hydrALAZINE  10 mg Oral TID  . labetalol  200 mg Oral BID  . methocarbamol  500 mg Oral BID  . pantoprazole  40 mg Oral Daily  . senna-docusate  1 tablet Oral QHS     HPI: [name] is a 67 y.o. 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 She was most recently hospitalized from  06/16/18-06/23/18 following a fall and resulting in a T10 vertebral body fracture and left proximal humerus fracture. During this admission she was noted to have a fever of 100.8. There was no clinical evidence of infection with no antibiotics indicated.   Last seen in the ID office in February 2019 for follow up of epidural abscess and diskitis.IT was recommended to restart Ceftin for a more prolonged course. No further follow up was noted.   Her back pain has worsened since previous and noted to have a low grade temperature of 100.3 in the ED. Unfortunately she was not able to tolerate MRI with images that were obtained showing an unstable fracture with osteomyelitis/diskitis not being able to be ruled out.  Awaiting neurosurgery evaluation.  Has been afebrile since she was initially admitted. Most recent ESR obtained 2 weeks ago was 15 with previous 10 months ago being 41. Blood cultures remain in process. Not currently on antibiotics at this time.   Since being discharged from the hospital [name] has been having increasing back pain with radiculopathy to her lower extremities with her right side effected more than her left. Describes the pain as a pain she has not experienced in the past. Severity is enough that any movement or attempts to sit up exacerbate her pain. Current pain medications are taking some of the pain away. Denies any new trauma or injury.   Review of Systems: Review of Systems  Constitutional: Negative for chills and fever.  Respiratory: Negative for cough, shortness of breath and wheezing.   Cardiovascular: Negative for chest pain and leg swelling.  Gastrointestinal: Negative for abdominal pain, constipation, diarrhea and nausea.  Musculoskeletal: Positive for back pain.  Skin: Negative for rash.  Neurological: Positive for weakness.     Past Medical History:  Diagnosis Date  . Anxiety   . Arthritis    "just about qwhere" (03/17/2015)  . CHF (congestive heart  failure) ([facility])    [name].......LAST OFFICE VISIT WAS A FEW AGO  . CHF (congestive heart failure) ([facility]) 03/06/2015  . Chronic bronchitis ([facility])    "just about q yr; use nebulizer prn" (03/17/2015)  . Chronic lower back pain   . Complication of anesthesia    @ [facility].....COULDN'T GET HER AWAKE....FOR  HERNIA SURGERY... PLACED ON VENTILATOR; woke up during cyst excision OR on my back"; 07/06/09 VHR: re-intubated in PACU due to hypoventilation, extubated POD#1  . Cyst of right kidney   . Discitis of lumbosacral region 03/06/2015  . 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 GERD (gastroesophageal reflux disease)    takes nexium  . Heart palpitations    takes Metoprolol  . Herpes simplex infection    LEFT  EYE----2 YR AGO  . History of pneumonia   . 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 Hypercholesteremia   . Hypertension    dr [name]  . IBS (irritable bowel syndrome) 03/06/2015  . Intertrigo 09/01/2017  . Klebsiella infection 02/28/2016  . Proteus infection 02/28/2016  . Pruritic condition 04/15/2016  . Sleep difficulties    pt. states she had sleep eval > 10 yrs. ago in [place], no apnea found  . 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 denies  . Zoster 03/06/2015    Social History   Tobacco Use  . Smoking status: Never Smoker  . Smokeless tobacco: Never Used  Substance Use Topics  . Alcohol use: Yes    Comment: "stopped drinking in the 1980's; never drank much"; very rarely  . Drug use: No    Family History  Problem Relation Age of Onset  . Hypertension Mother   . Hypertension Father   . Hypertension Brother   . Hypertension Daughter   . Hypertension Maternal Grandmother   . Hypertension Maternal Grandfather   . Hypertension Paternal Grandmother   . Hypertension Paternal Grandfather   . Breast cancer Maternal Aunt   . Anesthesia problems Neg Hx   . Heart attack Neg Hx   . Stroke Neg Hx     Allergies  Allergen Reactions  . Vancomycin Other (See Comments)     Drug induced NEUTROPENIA  . Aspirin Other (See Comments)    Stomach bleeding  . Ibuprofen Other (See Comments)    Stomach bleeding  . Mushroom Extract Complex Hives and Itching  . Shellfish Allergy Hives and Itching  . Sulfa Antibiotics Hives and Itching  . Sulfasalazine Itching and Hives  . Tylenol [Acetaminophen] Itching  . Betadine [Povidone Iodine] Itching  . Coconut Flavor Itching  . Codeine Itching  . Eggs Or Egg-Derived Products Nausea And Vomiting  . Iodine Itching  . Ivp Dye [Iodinated Diagnostic Agents] Hives    Takes Benadryl 60m PO before receiving iodinated contrast   . Metrizamide Hives    Takes Benadryl 576mPO before receiving iodinated contrast    OBJECTIVE: Blood pressure 123/74, pulse 80, temperature 98 F (36.7 C), temperature source Oral, resp. rate 18, SpO2 96 %.  Physical Exam  Constitutional: She appears well-developed and  well-nourished. No distress.  Lying in bed with head of bed slightly elevated. Pleasant.   Cardiovascular: Normal rate, regular rhythm, normal heart sounds and intact distal pulses. Exam reveals no gallop and no friction rub.  No murmur heard. Pulmonary/Chest: Effort normal and breath sounds normal. No stridor. No respiratory distress. She has no wheezes. She has no rales.  Abdominal: Soft. Bowel sounds are normal. She exhibits no distension. There is no tenderness.  Neurological: She is alert.  Bilateral lower extremities with 3/4+ strength worse on the right. Decreased sensation in the L4-S1 dermatomes again worse on the right than the left. Gross motor function appropriate. Pulses intact and appropriate.   Skin: Skin is warm and dry.  Psychiatric: She has a normal mood and affect.    Lab Results Lab Results  Component Value Date   WBC 11.6 (H) 06/29/2018   HGB 11.6 (L) 06/29/2018   HCT 41.8 06/29/2018   MCV 86.2 06/29/2018   PLT 436 (H) 06/29/2018    Lab Results  Component Value Date   CREATININE 0.88 06/29/2018   BUN  10 06/29/2018   NA 140 06/29/2018   K 4.1 06/29/2018   CL 106 06/29/2018   CO2 22 06/29/2018    Lab Results  Component Value Date   ALT 26 06/29/2018   AST 36 06/29/2018   ALKPHOS 91 06/29/2018   BILITOT 1.0 06/29/2018     Microbiology: Recent Results (from the past 240 hour(s))  Urine culture     Status: Abnormal   Collection Time: 06/25/18  6:07 PM  Result Value Ref Range Status   Specimen Description   Final    URINE, CLEAN CATCH Performed at [facility], [place] [address], [place], [place] [address]    Special Requests   Final    NONE Performed at [facility], [name] [address] [place], [place] [address]    Culture 20,000 COLONIES/mL PROTEUS MIRABILIS (A)  Final   Report Status 06/28/2018 FINAL  Final   Organism ID, Bacteria PROTEUS MIRABILIS (A)  Final      Susceptibility   Proteus mirabilis - MIC*    AMPICILLIN <=2 SENSITIVE Sensitive     CEFAZOLIN <=4 SENSITIVE Sensitive     CEFTRIAXONE <=1 SENSITIVE Sensitive     CIPROFLOXACIN <=0.25 SENSITIVE Sensitive     GENTAMICIN <=1 SENSITIVE Sensitive     IMIPENEM 1 SENSITIVE Sensitive     NITROFURANTOIN 128 RESISTANT Resistant     TRIMETH/SULFA <=20 SENSITIVE Sensitive     AMPICILLIN/SULBACTAM <=2 SENSITIVE Sensitive     PIP/TAZO <=4 SENSITIVE Sensitive     * 20,000 COLONIES/mL PROTEUS MIRABILIS  MRSA PCR Screening     Status: None   Collection Time: 06/30/18  1:43 AM  Result Value Ref Range Status   MRSA by PCR NEGATIVE NEGATIVE Final    Comment:        The GeneXpert MRSA Assay (FDA approved for NASAL specimens only), is one component of a comprehensive MRSA colonization surveillance program. It is not intended to diagnose MRSA infection nor to guide or monitor treatment for MRSA infections. Performed at [facility], [place] [address], [place], [place] [address]      Terri Piedra, [place] [facility] [place] [facility] [phone]  Pager  06/30/2018  10:17 AM

## 2018-07-01 DIAGNOSIS — M4644 Discitis, unspecified, thoracic region: Secondary | ICD-10-CM | POA: Diagnosis not present

## 2018-07-01 DIAGNOSIS — M4624 Osteomyelitis of vertebra, thoracic region: Secondary | ICD-10-CM | POA: Diagnosis not present

## 2018-07-01 DIAGNOSIS — Z881 Allergy status to other antibiotic agents status: Secondary | ICD-10-CM | POA: Diagnosis not present

## 2018-07-01 DIAGNOSIS — M545 Low back pain: Secondary | ICD-10-CM | POA: Diagnosis not present

## 2018-07-01 DIAGNOSIS — S32059A Unspecified fracture of fifth lumbar vertebra, initial encounter for closed fracture: Secondary | ICD-10-CM | POA: Diagnosis not present

## 2018-07-01 DIAGNOSIS — Z8619 Personal history of other infectious and parasitic diseases: Secondary | ICD-10-CM | POA: Diagnosis not present

## 2018-07-01 DIAGNOSIS — G062 Extradural and subdural abscess, unspecified: Secondary | ICD-10-CM | POA: Diagnosis not present

## 2018-07-01 DIAGNOSIS — S22078A Other fracture of T9-T10 vertebra, initial encounter for closed fracture: Secondary | ICD-10-CM | POA: Diagnosis not present

## 2018-07-01 DIAGNOSIS — Z886 Allergy status to analgesic agent status: Secondary | ICD-10-CM | POA: Diagnosis not present

## 2018-07-01 DIAGNOSIS — R531 Weakness: Secondary | ICD-10-CM | POA: Diagnosis not present

## 2018-07-01 DIAGNOSIS — Z6841 Body Mass Index (BMI) 40.0 and over, adult: Secondary | ICD-10-CM | POA: Diagnosis not present

## 2018-07-01 DIAGNOSIS — M5442 Lumbago with sciatica, left side: Secondary | ICD-10-CM

## 2018-07-01 DIAGNOSIS — M5441 Lumbago with sciatica, right side: Secondary | ICD-10-CM

## 2018-07-01 DIAGNOSIS — Z91012 Allergy to eggs: Secondary | ICD-10-CM | POA: Diagnosis not present

## 2018-07-01 DIAGNOSIS — Z882 Allergy status to sulfonamides status: Secondary | ICD-10-CM | POA: Diagnosis not present

## 2018-07-01 DIAGNOSIS — D72829 Elevated white blood cell count, unspecified: Secondary | ICD-10-CM | POA: Diagnosis not present

## 2018-07-01 DIAGNOSIS — M5416 Radiculopathy, lumbar region: Secondary | ICD-10-CM | POA: Diagnosis not present

## 2018-07-01 DIAGNOSIS — Z91013 Allergy to seafood: Secondary | ICD-10-CM | POA: Diagnosis not present

## 2018-07-01 DIAGNOSIS — Z91041 Radiographic dye allergy status: Secondary | ICD-10-CM | POA: Diagnosis not present

## 2018-07-01 DIAGNOSIS — Z885 Allergy status to narcotic agent status: Secondary | ICD-10-CM | POA: Diagnosis not present

## 2018-07-01 LAB — BASIC METABOLIC PANEL
Anion gap: 7 (ref 5–15)
BUN: 17 mg/dL (ref 8–23)
CALCIUM: 8.3 mg/dL — AB (ref 8.9–10.3)
CO2: 23 mmol/L (ref 22–32)
CREATININE: 1.5 mg/dL — AB (ref 0.44–1.00)
Chloride: 105 mmol/L (ref 98–111)
GFR calc Af Amer: 40 mL/min — ABNORMAL LOW (ref >60)
GFR, EST NON AFRICAN AMERICAN: 34 mL/min — AB (ref >60)
GLUCOSE: 110 mg/dL — AB (ref 70–99)
Potassium: 4 mmol/L (ref 3.5–5.1)
SODIUM: 135 mmol/L (ref 135–145)

## 2018-07-01 LAB — CBC
HCT: 29.6 % — ABNORMAL LOW (ref 36.0–46.0)
Hemoglobin: 8.9 g/dL — ABNORMAL LOW (ref 12.0–15.0)
MCH: 25.7 pg — AB (ref 26.0–34.0)
MCHC: 30.1 g/dL (ref 30.0–36.0)
MCV: 85.5 fL (ref 80.0–100.0)
PLATELETS: 246 10*3/uL (ref 150–400)
RBC: 3.46 MIL/uL — AB (ref 3.87–5.11)
RDW: 19.8 % — AB (ref 11.5–15.5)
WBC: 8.4 10*3/uL (ref 4.0–10.5)
nRBC: 0 % (ref 0.0–0.2)

## 2018-07-01 LAB — C-REACTIVE PROTEIN: CRP: 8.3 mg/dL — ABNORMAL HIGH (ref <1.0)

## 2018-07-01 LAB — SEDIMENTATION RATE: Sed Rate: 84 mm/hr — ABNORMAL HIGH (ref 0–22)

## 2018-07-01 LAB — GLUCOSE, CAPILLARY: Glucose-Capillary: 90 mg/dL (ref 70–99)

## 2018-07-01 MED ORDER — SODIUM CHLORIDE 0.9 % IV SOLN
2.0000 g | INTRAVENOUS | Status: DC
  Administered 2018-07-01 – 2018-07-03 (×3): 2 g via INTRAVENOUS
  Filled 2018-07-01 (×3): qty 20

## 2018-07-01 MED ORDER — DIPHENHYDRAMINE HCL 25 MG PO CAPS
50.0000 mg | ORAL_CAPSULE | Freq: Four times a day (QID) | ORAL | Status: DC | PRN
  Administered 2018-07-01 – 2018-07-02 (×4): 50 mg via ORAL
  Filled 2018-07-01 (×4): qty 2

## 2018-07-01 MED ORDER — SODIUM CHLORIDE 0.9 % IV SOLN
INTRAVENOUS | Status: DC
  Administered 2018-07-01 – 2018-07-02 (×3): via INTRAVENOUS

## 2018-07-01 NOTE — Progress Notes (Signed)
Patient ID: [name], female   DOB: 1950/09/24, 67 y.o.   MRN: [id number] Patient is well-known to me. I have reviewed her  Record and her imaging and have spoken with her. At this time I do not see anything that requires acute surgical intervention. There is a small fracture  Through the T10  Vertebral body.  I also believe there is a fracture through the right pars of L5.  It is difficult to know how  Of the signal change within the disc of T10-11 and L5-S1 is possibly related to infectious process.  Awaiting sedimentation rate.  For now  I would treat in a brace and mobilize  In the brace.  I would treat her pain expectantly.

## 2018-07-01 NOTE — Progress Notes (Signed)
[facility] Infect[facility] Disease  Date of Admission:  06/29/2018     Total days of antibiotics 0         ASSESSMENT/PLAN  [name] is currently being observed off antibiotics pending neurosurgery evaluation, however now with elevated inflammatory markers and MRI of the lumbar spine with concern for incomplete resolution or recurrent abscess. Blood cultures have been without growth to date. She previously had cultures with Proteus mirabilis and Klebsiella pneumoniae. There unfortunately is no focal fluid collection that would be able to be cultured. She has been afebrile with improved leukocytosis. At this point, it is reasonable to await neurosurgery evaluation for potential surgical intervention if indicated that we could obtain cultures from. If no surgical intervention will plan for re-treatment with prolonged antimicrobial therapy.   1. Continue to monitor off antibiotics pending neurosurgery evaluation. 2. Pain management per primary team.   Principal Problem:   Fracture of T10 vertebra (HCC) Active Problems:   Hypertension   Chronic combined systolic and diastolic CHF, NYHA class 1 (HCC)   Fever   . citalopram  20 mg Oral Daily  . enoxaparin (LOVENOX) injection  40 mg Subcutaneous Daily  . hydrALAZINE  10 mg Oral TID  . labetalol  200 mg Oral BID  . methocarbamol  500 mg Oral BID  . pantoprazole  40 mg Oral Daily  . senna-docusate  1 tablet Oral QHS    SUBJECTIVE:  Afebrile overnight with no normal WBC count of 8.4. CRP of 8.3 and ESR of 84. MRI of the lumbar spine with abnormal enhancement of the epidural space from L2-L3 through sacrum decreased since previous imaging with radiologist concern for recurrent epidural abscess without focal fluid collection. Blood cultures from 11/12 without growth to date.  Continues to have worsening back pain with radiculopathy worsened on the right side. Denies fevers, chills or sweats.   Allergies  Allergen Reactions  .  Vancomycin Other (See Comments)    Drug induced NEUTROPENIA  . Aspirin Other (See Comments)    Stomach bleeding  . Ibuprofen Other (See Comments)    Stomach bleeding  . Mushroom Extract Complex Hives and Itching  . Shellfish Allergy Hives and Itching  . Sulfa Antibiotics Hives and Itching  . Sulfasalazine Itching and Hives  . Tylenol [Acetaminophen] Itching  . Betadine [Povidone Iodine] Itching  . Coconut Flavor Itching  . Codeine Itching  . Eggs Or Egg-Derived Products Nausea And Vomiting  . Iodine Itching  . Ivp Dye [Iodinated Diagnostic Agents] Hives    Takes Benadryl '50mg'$  PO before receiving iodinated contrast   . Metrizamide Hives    Takes Benadryl '50mg'$  PO before receiving iodinated contrast     Review of Systems: Review of Systems  Constitutional: Negative for chills and fever.  Respiratory: Negative for cough, shortness of breath and wheezing.   Cardiovascular: Negative for chest pain and leg swelling.  Musculoskeletal: Positive for back pain.  Skin: Negative for rash.  Neurological: Positive for weakness.    OBJECTIVE: Vitals:   06/30/18 0505 06/30/18 1607 06/30/18 1609 06/30/18 2100  BP: 123/74 123/64 123/64 (!) 115/52  Pulse: 80  78 79  Resp: 18   17  Temp: 98 F (36.7 C)  98.2 F (36.8 C) 98 F (36.7 C)  TempSrc: Oral  Oral Oral  SpO2: 96%  97% 95%   There is no height or weight on file to calculate BMI.  Physical Exam  Constitutional: She is oriented to person, place, and time. She  appears well-developed and well-nourished. No distress.  Lying in bed with head of bed elevated. Pleasant.   Cardiovascular: Normal rate, regular rhythm, normal heart sounds and intact distal pulses. Exam reveals no gallop and no friction rub.  No murmur heard. Pulmonary/Chest: Effort normal and breath sounds normal. No stridor. No respiratory distress. She has no wheezes. She has no rales.  Abdominal: Soft. Bowel sounds are normal.  Neurological: She is alert and  oriented to person, place, and time.  Skin: Skin is warm and dry.  Psychiatric: She has a normal mood and affect.    Lab Results Lab Results  Component Value Date   WBC 8.4 07/01/2018   HGB 8.9 (L) 07/01/2018   HCT 29.6 (L) 07/01/2018   MCV 85.5 07/01/2018   PLT 246 07/01/2018    Lab Results  Component Value Date   CREATININE 1.50 (H) 07/01/2018   BUN 17 07/01/2018   NA 135 07/01/2018   K 4.0 07/01/2018   CL 105 07/01/2018   CO2 23 07/01/2018    Lab Results  Component Value Date   ALT 26 06/29/2018   AST 36 06/29/2018   ALKPHOS 91 06/29/2018   BILITOT 1.0 06/29/2018     Microbiology: Recent Results (from the past 240 hour(s))  Urine culture     Status: Abnormal   Collection Time: 06/25/18  6:07 PM  Result Value Ref Range Status   Specimen Description   Final    URINE, CLEAN CATCH Performed at [facility], [place] [address], [place], [place] [address]    Special Requests   Final    NONE Performed at [facility], [place] [address] [place], [place] [address]    Culture 20,000 COLONIES/mL PROTEUS MIRABILIS (A)  Final   Report Status 06/28/2018 FINAL  Final   Organism ID, Bacteria PROTEUS MIRABILIS (A)  Final      Susceptibility   Proteus mirabilis - MIC*    AMPICILLIN <=2 SENSITIVE Sensitive     CEFAZOLIN <=4 SENSITIVE Sensitive     CEFTRIAXONE <=1 SENSITIVE Sensitive     CIPROFLOXACIN <=0.25 SENSITIVE Sensitive     GENTAMICIN <=1 SENSITIVE Sensitive     IMIPENEM 1 SENSITIVE Sensitive     NITROFURANTOIN 128 RESISTANT Resistant     TRIMETH/SULFA <=20 SENSITIVE Sensitive     AMPICILLIN/SULBACTAM <=2 SENSITIVE Sensitive     PIP/TAZO <=4 SENSITIVE Sensitive     * 20,000 COLONIES/mL PROTEUS MIRABILIS  Blood culture (routine x 2)     Status: None (Preliminary result)   Collection Time: 06/30/18  1:25 AM  Result Value Ref Range Status   Specimen Description BLOOD RIGHT HAND  Final   Special Requests   Final    BOTTLES DRAWN  AEROBIC ONLY Blood Culture adequate volume   Culture   Final    NO GROWTH 1 DAY Performed at [facility], [address], [place], [place] [address]    Report Status PENDING  Incomplete  Blood culture (routine x 2)     Status: None (Preliminary result)   Collection Time: 06/30/18  1:28 AM  Result Value Ref Range Status   Specimen Description BLOOD RIGHT HAND  Final   Special Requests   Final    BOTTLES DRAWN AEROBIC ONLY Blood Culture adequate volume   Culture   Final    NO GROWTH 1 DAY Performed at [facility], [place] [address] [place], [place] [address]    Report Status PENDING  Incomplete  MRSA PCR Screening     Status: None   Collection Time: 06/30/18  1:43 AM  Result Value Ref Range Status   MRSA by PCR NEGATIVE NEGATIVE Final    Comment:        The GeneXpert MRSA Assay (FDA approved for NASAL specimens only), is one component of a comprehensive MRSA colonization surveillance program. It is not intended to diagnose MRSA infection nor to guide or monitor treatment for MRSA infections. Performed at [facility], [place] [address] [place], [place] [address]      Terri Piedra, [place] [facility] [place] [facility] [phone] Pager  07/01/2018  9:17 AM

## 2018-07-01 NOTE — Progress Notes (Signed)
[employer] came to fit pt for TLSO brace and pt stated that she had already been fitted for one at [facility]. She also stated that she had it already that she would call her daughter to bring it to the hospital.

## 2018-07-01 NOTE — Progress Notes (Signed)
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  Patient was readmitted 11/2 to the ED with worsening low back pain, in the ER fever of 100.3.  Patient was admitted for further work-up.  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  Surgery, infectious disease on consult.  Await further recommendation.  We will obtain pain control with opiates, muscle relaxant.  Have elevated ESR and sed rate.  Of note patient is not taking long-term Ceftin.  Neurosurgery has no current plan for intervention, advises to treat in a brace and mobilize in the brace. Obtain PT eval if okay w neurosurgery.  Hypertension: Well-controlled, continue home meds  Chronic combined systolic and diastolic CHF, NYHA class 1: Currently compensated.  Hold diuretics due to elevated creatinine and start on IV fluids.   DVT prophylaxis: Subcu Lovenox Code Status: Code Family Communication: No family at the bedside, patient updated. Disposition Plan: To be decided  Subjective: Patient complains of pain on the lower back radiating to the right buttock and the right anterior posteriorly.  Able to move her right leg sensation present, denies bowel bladder incontinence.  Afebrile. Has nausea vomiting chest  pain shortness of breath.  Objective: Vitals:   06/30/18 0505 06/30/18 1607 06/30/18 1609 06/30/18 2100  BP: 123/74 123/64 123/64 (!) 115/52  Pulse: 80  78 79  Resp: 18   17  Temp: 98 F (36.7 C)  98.2 F (36.8 C) 98 F (36.7 C)  TempSrc: Oral  Oral Oral  SpO2: 96%  97% 95%   No intake or output data in the 24 hours ending 07/01/18 1251 There were no vitals filed for this visit. Weight change:   There is no height or weight on file to calculate BMI.  Examination:  General exam: Appears calm and comfortable, morbidly obese.  HEENT:PERRL,Oral mucosa moist, Ear/Nose normal on gross exam Respiratory system: Bilateral equal air entry, normal vesicular breath sounds, no wheezes or crackles  Cardiovascular system: S1 & S2 heard, RRR. No pedal edema. Gastrointestinal system: Abdomen is nondistended, soft and nontender. No organomegaly or masses felt. Normal bowel sounds heard. Nervous System:Alert and oriented.  Able to move right leg but weakness noted, sensation intact  Extremities: No edema,distal peripheral pulses palpable. Skin: No rashes, lesions or ulcers,no icterus ,no pallor MSK: Normal muscle bulk,tone ,power  Data Reviewed: I have personally reviewed following labs and imaging studies  CBC: Recent Labs  Lab 06/25/18 1807 06/29/18 1255 07/01/18 0208  WBC 8.1 11.6* 8.4  NEUTROABS 3.1  --   --   HGB 9.9* 11.6* 8.9*  HCT 32.9* 41.8 29.6*  MCV 85.5 86.2 85.5  PLT 258 436* 086   Basic Metabolic Panel: Recent Labs  Lab 06/25/18 1807 06/29/18 1455 07/01/18 0208  NA 140 140 135  K 3.7 4.1 4.0  CL 103 106 105  CO2 '26 22 23  '$ GLUCOSE 106* 105* 110*  BUN '11 10 17  '$ CREATININE 0.84 0.88 1.50*  CALCIUM 8.7* 8.8* 8.3*   GFR: Estimated Creatinine Clearance: 44.9 mL/min (A) (by C-G formula based on SCr of 1.5 mg/dL (H)). Liver Function Tests: Recent Labs  Lab 06/25/18 1807 06/29/18 1455  AST 21 36  ALT 14 26  ALKPHOS 65 91  BILITOT 0.7 1.0  PROT 7.0 7.4    ALBUMIN 3.1* 2.9*   No results for input(s): LIPASE, AMYLASE in the last 168 hours. No results for input(s): AMMONIA in the last 168 hours. Coagulation Profile: Recent Labs  Lab 06/25/18 1807  INR 1.07   Cardiac Enzymes: Recent Labs  Lab 06/25/18 1807  CKTOTAL 90   BNP (last 3 results) No results for input(s): PROBNP in the last 8760 hours. HbA1C: No results for input(s): HGBA1C in the last 72 hours. CBG: Recent Labs  Lab 06/30/18 1124 06/30/18 2123 07/01/18 0630  GLUCAP 125* 92 90   Lipid Profile: No results for input(s): CHOL, HDL, LDLCALC, TRIG, CHOLHDL, LDLDIRECT in the last 72 hours. Thyroid Function Tests: No results for input(s): TSH, T4TOTAL, FREET4, T3FREE, THYROIDAB in the last 72 hours. Anemia Panel: No results for input(s): VITAMINB12, FOLATE, FERRITIN, TIBC, IRON, RETICCTPCT in the last 72 hours. Sepsis Labs: Recent Labs  Lab 06/29/18 1500  LATICACIDVEN 1.17    Recent Results (from the past 240 hour(s))  Urine culture     Status: Abnormal   Collection Time: 06/25/18  6:07 PM  Result Value Ref Range Status   Specimen Description   Final    URINE, CLEAN CATCH Performed at [facility] [address] [place], [place] [address]    Special Requests   Final    NONE Performed at [facility], [place] [address] [place], [place] [address]    Culture 20,000 COLONIES/mL PROTEUS MIRABILIS (A)  Final   Report Status 06/28/2018 FINAL  Final   Organism ID, Bacteria PROTEUS MIRABILIS (A)  Final      Susceptibility   Proteus mirabilis - MIC*    AMPICILLIN <=2 SENSITIVE Sensitive     CEFAZOLIN <=4 SENSITIVE Sensitive     CEFTRIAXONE <=1 SENSITIVE Sensitive     CIPROFLOXACIN <=0.25 SENSITIVE Sensitive     GENTAMICIN <=1 SENSITIVE Sensitive     IMIPENEM 1 SENSITIVE Sensitive     NITROFURANTOIN 128 RESISTANT Resistant     TRIMETH/SULFA <=20 SENSITIVE Sensitive     AMPICILLIN/SULBACTAM <=2 SENSITIVE Sensitive      PIP/TAZO <=4 SENSITIVE Sensitive     * 20,000 COLONIES/mL PROTEUS MIRABILIS  Blood culture (routine x 2)     Status: None (Preliminary result)   Collection Time: 06/30/18  1:25 AM  Result Value Ref Range Status   Specimen Description BLOOD RIGHT HAND  Final   Special Requests   Final    BOTTLES DRAWN AEROBIC ONLY Blood Culture adequate volume   Culture   Final    NO GROWTH 1 DAY Performed at [facility], [address] [place], [place] [address]    Report Status PENDING  Incomplete  Blood culture (routine x 2)     Status: None (Preliminary result)   Collection Time: 06/30/18  1:28 AM  Result Value Ref Range Status   Specimen Description BLOOD RIGHT HAND  Final   Special Requests   Final    BOTTLES DRAWN AEROBIC ONLY Blood Culture adequate volume   Culture   Final    NO GROWTH 1 DAY Performed at [facility], [address], [place], [place] [address]    Report Status PENDING  Incomplete  MRSA PCR Screening     Status: None   Collection Time: 06/30/18  1:43 AM  Result Value Ref Range Status   MRSA by PCR NEGATIVE NEGATIVE Final    Comment:        The GeneXpert MRSA Assay (FDA approved for NASAL specimens only), is one component of a comprehensive MRSA colonization surveillance program. It is not intended to diagnose MRSA infection nor to guide or monitor treatment for MRSA infections. Performed at [facility], [place] [address] [place], [place] [address]       Radiology Studies: Mr Thoracic Spine Wo Contrast  Addendum Date: 06/29/2018   ADDENDUM REPORT: 06/29/2018 22:09 ADDENDUM: Study discussed by telephone with Dr. Reinaldo Meeker in the ED on 06/29/2018 at 2202 hours. He advised that the patient has been febrile recently, and so that rationale for the attempted spine MRI was possible spinal infection. We discussed that although I do not have enough imaging to exclude a T10-T11 discitis, I have no suspicion of paraspinal phlegmon to strongly suggest an  infection. And furthermore we know the inferior T10 level was fracture based on the October CT. We discussed that a complete MRI exam when the patient is better able to tolerate would be valuable. Electronically Signed   By: Genevie Ann M.D.   On: 06/29/2018 22:09   Result Date: 06/29/2018 CLINICAL DATA:  67 year old female with generalized pain, weakness. Recently hospitalized after a fall with T10 compression fracture. EXAM: MRI THORACIC SPINE WITHOUT CONTRAST TECHNIQUE: Multiplanar, multisequence MR imaging of the thoracic spine was performed. No intravenous contrast was administered. COMPARISON:  CT thoracic and lumbar spine 06/16/2018. FINDINGS: The examination had to be discontinued prior to completion due to pain. Only sagittal T2 weighted imaging, along with a sagittal T1 weighted scout image, could be obtained. Alignment: Thoracic vertebral alignment appears stable since 06/16/2018. No spondylolisthesis is evident. Vertebrae: Abnormal appearance of the T10 inferior endplate and Y86-V78 disc space. The anterior inferior T10 endplate was fractured on the comparison. There is increased T2 signal within the T10-T11 disc space which appears conspicuously widened (series 27, image 10). However, T10 vertebral body height is relatively maintained. Underlying previous T10-T11 and T11-T12 posterior decompression. Cord: Limited. No definite thoracic spinal cord signal abnormality. Paraspinal and other soft tissues: Postoperative changes to the lower thoracic and lumbar posterior paraspinal soft tissues. Disc levels: No definite thoracic spinal stenosis at or above T10-T11. Suspect residual mild spinal stenosis at T11-T12 where bulky posterior disc osteophyte complex was noted on the CT last month. IMPRESSION: 1. Limited exam, only somewhat motion degraded sagittal T2 weighted imaging could be obtained as the examination was discontinued due to patient pain. 2. Fracture through the T10 inferior endplate seen in October  is superimposed on previous T10-T11 laminectomy. And there is abnormal signal in that disc space which appears widened. This constellation is highly suspicious for an unstable T10-T11 injury through the disc space. Recommend full spine precautions and Spine Surgery consultation. 3. The T11-T12 level was also previously posteriorly decompressed and mild residual spinal stenosis is suspected there. Electronically Signed: By: Genevie Ann M.D. On: 06/29/2018 21:59   Mr Lumbar Spine W Wo Contrast  Result Date: 06/30/2018 CLINICAL DATA:  Generalized pain, recent fall. History of epidural abscess and spine surgery. EXAM: MRI LUMBAR SPINE WITHOUT AND WITH CONTRAST TECHNIQUE: Multiplanar and multiecho pulse sequences of the lumbar spine were obtained without  and with intravenous contrast. CONTRAST:  10 cc Gadavist COMPARISON:  CT lumbar spine June 16, 2018 and CT abdomen and pelvis January 31, 2018 and MRI lumbar spine May 18, 2017. FINDINGS: Moderately motion degraded examination. SEGMENTATION: For the purposes of this report, the last well-formed intervertebral disc is reported as L5-S1. ALIGNMENT: Maintained lumbar lordosis. Grade 1 L5-S1 anterolisthesis, progressed from prior imaging. Stable grade L2-3 retrolisthesis without spondylolysis. VERTEBRAE: Vertebral bodies are intact. Enhancing bright STIR signal through RIGHT L5 pars interarticularis defects, status post interval hardware removal from 2018. Widened T2 bright L5-S1 disc space, increased from prior CT and new from prior MRI. 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 L4-5 interbody fusion material, previously demonstrated arthrodesis. Nonenhancing fluid signal within the posterior L4-5 disc space. Stable moderate L2-3 mild L3-4 disc height loss with mild chronic discogenic endplate changes. Focal fat versus hemangioma S1. Mild enhancing L5-S1 discogenic endplate changes. No suspicious disc enhancement. CONUS MEDULLARIS AND CAUDA EQUINA: Conus  medullaris terminates at L1-2 and demonstrates normal morphology and signal characteristics. Central displacement of the cauda equina due to canal stenosis L2-3. Linear low signal LEFT aspect of mid lumbar thecal sac without enhancement, this may reflect adhesion. Abnormal enhancement epidural space from L2-3 through the sacrum decreased from prior imaging. PARASPINAL AND OTHER SOFT TISSUES: Paraspinal muscle denervation. Prevertebral fluid signal and mild enhancement L5-S1 without discrete fluid collection. DISC LEVELS: L1-2: No disc bulge. Moderate to severe facet arthropathy. No canal stenosis or neural foraminal narrowing. L2-3: Retrolisthesis. Moderate broad-based disc bulge. Severe facet arthropathy. Moderate canal stenosis. Moderate bilateral neural foraminal narrowing. L3-4: Posterior decompression, advanced facet arthropathy. No canal stenosis. Moderate RIGHT and mild-to-moderate LEFT neural foraminal narrowing. L4-5: Posterior decompression. Advanced facet arthropathy. No canal stenosis. Moderate RIGHT and mild LEFT neural foraminal narrowing. L5-S1: Small broad-based disc bulge. Severe facet arthropathy. No canal stenosis. Moderate bilateral neural foraminal narrowing IMPRESSION: 1. Motion degraded examination. Widened L5-S1 disc space with disrupted ALL likely posttraumatic. 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 No convincing discitis osteomyelitis. L5-S1 prevertebral fluid may be posttraumatic or infectious. 2. 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 3. Stable grade 1 L2-3 retrolisthesis, status post L3-4 through L5-S1 posterior decompression. 4. Moderate canal stenosis L2-3. Moderate neural foraminal narrowing L2-3 through L5-S1. Electronically Signed   By: Elon Alas M.D.   On: 06/30/2018 22:25   Dg Chest Port 1 View  Result Date: 06/29/2018 CLINICAL DATA:   67 year old female with a history of CHF EXAM: PORTABLE CHEST 1 VIEW COMPARISON:  06/16/2018 FINDINGS: Cardiomediastinal silhouette unchanged in size and contour. No pneumothorax. No pleural effusion. No confluent airspace disease. No interlobular septal thickening. Comminuted fracture of the proximal left humerus. Surgical changes of the right proximal humerus. Degenerative changes of the acromioclavicular joints. IMPRESSION: Negative for acute cardiopulmonary disease. Comminuted fracture of the left humerus. Surgical changes of the right humerus. Electronically Signed   By: Corrie Mckusick D.O.   On: 06/29/2018 15:12        Scheduled Meds: . citalopram  20 mg Oral Daily  . enoxaparin (LOVENOX) injection  40 mg Subcutaneous Daily  . hydrALAZINE  10 mg Oral TID  . labetalol  200 mg Oral BID  . methocarbamol  500 mg Oral BID  . pantoprazole  40 mg Oral Daily  . senna-docusate  1 tablet Oral QHS   Continuous Infusions: . sodium chloride 75 mL/hr at 07/01/18 1126     LOS: 1  day    Time spent: More than 50% of that time was spent in counseling and/or coordination of care.      Antonieta Pert, MD [facility] Pager [phone]  If 7PM-7AM, please contact night-coverage [email or web address] Password [facility] 07/01/2018, 12:51 PM   Please Note: This patient record was dictated using Editor, commissioning. Chart creation errors have been sought, but may not always have been located. Such creation errors do not reflect on the Standard of Medical Care.

## 2018-07-01 NOTE — Progress Notes (Signed)
PT itching all over RN paged hospitalist to try and get something for itching awaiting call back.

## 2018-07-02 ENCOUNTER — Inpatient Hospital Stay: Payer: Self-pay

## 2018-07-02 DIAGNOSIS — M5441 Lumbago with sciatica, right side: Secondary | ICD-10-CM | POA: Diagnosis not present

## 2018-07-02 DIAGNOSIS — M545 Low back pain: Secondary | ICD-10-CM | POA: Diagnosis not present

## 2018-07-02 DIAGNOSIS — M4646 Discitis, unspecified, lumbar region: Secondary | ICD-10-CM

## 2018-07-02 DIAGNOSIS — M5442 Lumbago with sciatica, left side: Secondary | ICD-10-CM | POA: Diagnosis not present

## 2018-07-02 DIAGNOSIS — M4624 Osteomyelitis of vertebra, thoracic region: Secondary | ICD-10-CM | POA: Diagnosis not present

## 2018-07-02 DIAGNOSIS — S22078A Other fracture of T9-T10 vertebra, initial encounter for closed fracture: Secondary | ICD-10-CM | POA: Diagnosis not present

## 2018-07-02 DIAGNOSIS — G062 Extradural and subdural abscess, unspecified: Secondary | ICD-10-CM | POA: Diagnosis not present

## 2018-07-02 DIAGNOSIS — M4644 Discitis, unspecified, thoracic region: Secondary | ICD-10-CM | POA: Diagnosis not present

## 2018-07-02 DIAGNOSIS — S32059A Unspecified fracture of fifth lumbar vertebra, initial encounter for closed fracture: Secondary | ICD-10-CM | POA: Diagnosis not present

## 2018-07-02 DIAGNOSIS — Z6841 Body Mass Index (BMI) 40.0 and over, adult: Secondary | ICD-10-CM | POA: Diagnosis not present

## 2018-07-02 LAB — CBC
HEMATOCRIT: 32.1 % — AB (ref 36.0–46.0)
HEMOGLOBIN: 9.3 g/dL — AB (ref 12.0–15.0)
MCH: 25.1 pg — ABNORMAL LOW (ref 26.0–34.0)
MCHC: 29 g/dL — ABNORMAL LOW (ref 30.0–36.0)
MCV: 86.5 fL (ref 80.0–100.0)
NRBC: 0 % (ref 0.0–0.2)
Platelets: 283 10*3/uL (ref 150–400)
RBC: 3.71 MIL/uL — ABNORMAL LOW (ref 3.87–5.11)
RDW: 19.8 % — ABNORMAL HIGH (ref 11.5–15.5)
WBC: 7.3 10*3/uL (ref 4.0–10.5)

## 2018-07-02 LAB — BASIC METABOLIC PANEL
ANION GAP: 8 (ref 5–15)
BUN: 11 mg/dL (ref 8–23)
CHLORIDE: 106 mmol/L (ref 98–111)
CO2: 23 mmol/L (ref 22–32)
Calcium: 8.3 mg/dL — ABNORMAL LOW (ref 8.9–10.3)
Creatinine, Ser: 0.89 mg/dL (ref 0.44–1.00)
GFR calc non Af Amer: >60 mL/min (ref >60)
Glucose, Bld: 96 mg/dL (ref 70–99)
Potassium: 4.1 mmol/L (ref 3.5–5.1)
SODIUM: 137 mmol/L (ref 135–145)

## 2018-07-02 MED ORDER — ALUM & MAG HYDROXIDE-SIMETH 200-200-20 MG/5ML PO SUSP
30.0000 mL | Freq: Four times a day (QID) | ORAL | Status: DC | PRN
  Administered 2018-07-02: 30 mL via ORAL
  Filled 2018-07-02: qty 30

## 2018-07-02 NOTE — Progress Notes (Signed)
PT Cancellation Note  Patient Details Name: [name] MRN: [id number] DOB: 18-Nov-1950   Cancelled Treatment:    Reason Eval/Treat Not Completed: Active bedrest order Current "strict bed rest" orders. Will need advanced activity orders prior to patient being seen by therapies.   Lanney Gins, PT, DPT Supplemental Physical Therapist 07/02/18 11:33 AM Pager: [phone] Office: [phone]

## 2018-07-02 NOTE — Care Management Note (Addendum)
Case Management Note  Patient Details  Name: [name] MRN: [id number] Date of Birth: 03/07/1951  Subjective/Objective:    Discitis/osteomyelitis,  Fall, small fracture through T10,                Action/Plan: NCM spoke to pt at bedside and dtr, Stanton Kidney on speaker phone. Pt lives at home alone but has a aide that comes 3 hours per day paid from through [facility]. Pt adamantly refuses SNF. NCM explained the importance of discussing with dtr, and son her preference to dc home with [facility]. States she does not want to lose her disability check while in facility the entire. States she pays her rent and other bills with her check. She has RW, bedside commode, cane, and shower bench at home. Pt is requesting hospital bed with hoyer lift. Will also need transfer board. Contacted [facility] with referral for IV abx at home. Pt was trained in the past on how to give IV abx in her home.   07/03/2018 Pt agreeable to CIR. Notified [facility].   Expected Discharge Date:  07/03/2018              Expected Discharge Plan:  IP Rehab  In-House Referral:  NA  Discharge planning Services  CM Consult  Post Acute Care Choice:  Home Health, Resumption of Svcs/PTA Provider Choice offered to:  Patient, Adult Children  DME Arranged:  Other see comment, Hospital bed DME Agency:  [place]:  RN, PT, OT, Nurse's Aide, Social Work [employer] Agency:  [place]  Status of Service:  Completed, signed off  If discussed at [employer], dates discussed:    Additional Comments:  Erenest Rasher, RN 07/02/2018, 12:29 PM

## 2018-07-02 NOTE — Progress Notes (Signed)
Patient ID: [name], female   DOB: 02/26/1951, 67 y.o.   MRN: [id number] Her high sed rate and crp combined with the imaging findings and pain syndrome are very suggestive of infectious etiology instead of traumatic, especially at L5-S1. Would restart antibiotics as soon as ID agrees.

## 2018-07-02 NOTE — Progress Notes (Addendum)
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  Patient was readmitted 11/2 to the ED with worsening low back pain, in the ER fever of 100.3.  Patient was admitted for further work-up.Patient had elevated ESR, sed rate but no leukocytosis, now no fever episode.  seen by neurosurgery no plan for surgical intervention, seen by infectious disease and to continue with IV ceftriaxone for 2 weeks 2 g every day with ESR, CRP every 2 weeks.  Patient will be getting PICC line as per infectious disease and will follow-up outpatient with labs.  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  seen by neurosurgery no plan for surgical intervention, seen by infectious disease and to continue with IV ceftriaxone for 8 weeks 2 g every day with ESR, CRP every 2 weeks.  Patient will be getting PICC line as per infectious disease and will follow-up outpatient with labs. Cont pain control with opiates, muscle relaxant. Patient is cont with brace and mobilize in the brace which she has at home-neurosurgery  placed request regarding lifting bed rest after she has brace.  Morbid Obesity w BMI  >40: Advised weight  loss, healthy lifestyle.  Hypertension: Well-controlled, continue home meds  Chronic combined systolic and diastolic CHF, NYHA class 1: Currently compensated.    Mild AKI, resolved with IV fluids.   DVT prophylaxis: Subcu Lovenox Code Status: Code Family Communication: No family at the bedside, patient updated. Disposition Plan: Home w PICC line for antibiotics tomorrow w hospital home, [facility]. She declined SNF. Discussed wSW  Subjective: C/o back pain, no focal weakness  Objective: Vitals:   07/01/18 1440 07/01/18 2227 07/02/18 0412 07/02/18 0913  BP: (!) 126/48 (!) 129/45 (!) 166/64 (!) 154/72  Pulse: 72 80 73   Resp: '20 18 18   '$ Temp:   98.7 F (37.1 C)   TempSrc:   Oral   SpO2: 98% 98% 98%     Intake/Output Summary (Last 24 hours) at 07/02/2018 1126 Last data filed at 07/02/2018 0841 Gross per 24 hour  Intake 1878.29 ml  Output 400 ml  Net 1478.29 ml   There were no vitals filed for this visit. Weight change:   There is no height or weight on file to calculate BMI.  Examination:  General exam: obese, in mild pain,not in acute distress, average built.  HEENT:Oral mucosa moist, Ear/Nose WNL grossly Respiratory system: Bilateral equal air entry, no crackles and wheezing Cardiovascular system: S1 & S2 heard, No JVD/murmurs.No pedal edema. Gastrointestinal system: Abdomen soft, nontender non-distended, BS +. Nervous System:Alert/awake/oriented at baseline. Able to move UE and LE Extremities: No edema,distal peripheral pulses palpable. Skin: No rashes,no icterus. MSK: Normal muscle bulk,tone ,power  Data Reviewed: I have personally reviewed following labs and imaging studies  CBC: Recent Labs  Lab 06/25/18 1807 06/29/18 1255 07/01/18 7867  07/02/18 0134  WBC 8.1 11.6* 8.4 7.3  NEUTROABS 3.1  --   --   --   HGB 9.9* 11.6* 8.9* 9.3*  HCT 32.9* 41.8 29.6* 32.1*  MCV 85.5 86.2 85.5 86.5  PLT 258 436* 246 784   Basic Metabolic Panel: Recent Labs  Lab  06/25/18 1807 06/29/18 1455 07/01/18 0208 07/02/18 0134  NA 140 140 135 137  K 3.7 4.1 4.0 4.1  CL 103 106 105 106  CO2 '26 22 23 23  '$ GLUCOSE 106* 105* 110* 96  BUN '11 10 17 11  '$ CREATININE 0.84 0.88 1.50* 0.89  CALCIUM 8.7* 8.8* 8.3* 8.3*   GFR: Estimated Creatinine Clearance: 75.7 mL/min (by C-G formula based on SCr of 0.89 mg/dL). Liver Function Tests: Recent Labs  Lab 06/25/18 1807 06/29/18 1455  AST 21 36  ALT 14 26  ALKPHOS 65 91  BILITOT 0.7 1.0  PROT 7.0 7.4  ALBUMIN 3.1* 2.9*   No results for input(s): LIPASE, AMYLASE in the last 168 hours. No results for input(s): AMMONIA in the last 168 hours. Coagulation Profile: Recent Labs  Lab 06/25/18 1807  INR 1.07   Cardiac Enzymes: Recent Labs  Lab 06/25/18 1807  CKTOTAL 90   BNP (last 3 results) No results for input(s): PROBNP in the last 8760 hours. HbA1C: No results for input(s): HGBA1C in the last 72 hours. CBG: Recent Labs  Lab 06/30/18 1124 06/30/18 2123 07/01/18 0630  GLUCAP 125* 92 90   Lipid Profile: No results for input(s): CHOL, HDL, LDLCALC, TRIG, CHOLHDL, LDLDIRECT in the last 72 hours. Thyroid Function Tests: No results for input(s): TSH, T4TOTAL, FREET4, T3FREE, THYROIDAB in the last 72 hours. Anemia Panel: No results for input(s): VITAMINB12, FOLATE, FERRITIN, TIBC, IRON, RETICCTPCT in the last 72 hours. Sepsis Labs: Recent Labs  Lab 06/29/18 1500  LATICACIDVEN 1.17    Recent Results (from the past 240 hour(s))  Urine culture     Status: Abnormal   Collection Time: 06/25/18  6:07 PM  Result Value Ref Range Status   Specimen Description   Final    URINE, CLEAN CATCH Performed at [facility] [address] [place], [place] [address]    Special Requests   Final    NONE Performed at [facility], [place] [address], [place],  [address]    Culture 20,000 COLONIES/mL PROTEUS MIRABILIS (A)  Final   Report Status 06/28/2018 FINAL  Final    Organism ID, Bacteria PROTEUS MIRABILIS (A)  Final      Susceptibility   Proteus mirabilis - MIC*    AMPICILLIN <=2 SENSITIVE Sensitive     CEFAZOLIN <=4 SENSITIVE Sensitive     CEFTRIAXONE <=1 SENSITIVE Sensitive     CIPROFLOXACIN <=0.25 SENSITIVE Sensitive     GENTAMICIN <=1 SENSITIVE Sensitive     IMIPENEM 1 SENSITIVE Sensitive     NITROFURANTOIN 128 RESISTANT Resistant     TRIMETH/SULFA <=20 SENSITIVE Sensitive     AMPICILLIN/SULBACTAM <=2 SENSITIVE Sensitive     PIP/TAZO <=4 SENSITIVE Sensitive     * 20,000 COLONIES/mL PROTEUS MIRABILIS  Blood culture (routine x 2)     Status: None (Preliminary result)   Collection Time: 06/30/18  1:25 AM  Result Value Ref Range Status   Specimen Description BLOOD RIGHT HAND  Final   Special Requests   Final    BOTTLES DRAWN AEROBIC ONLY Blood Culture adequate volume   Culture   Final    NO GROWTH 2 DAYS Performed at  [facility], [place] [address] [place], [place] [address]    Report Status PENDING  Incomplete  Blood culture (routine x 2)     Status: None (Preliminary result)   Collection Time: 06/30/18  1:28 AM  Result Value Ref Range Status   Specimen Description BLOOD RIGHT HAND  Final   Special Requests   Final    BOTTLES DRAWN AEROBIC ONLY Blood Culture adequate volume   Culture   Final    NO GROWTH 2 DAYS Performed at [facility], [place] [address] [place], [place] [address]    Report Status PENDING  Incomplete  MRSA PCR Screening     Status: None   Collection Time: 06/30/18  1:43 AM  Result Value Ref Range Status   MRSA by PCR NEGATIVE NEGATIVE Final    Comment:        The GeneXpert MRSA Assay (FDA approved for NASAL specimens only), is one component of a comprehensive MRSA colonization surveillance program. It is not intended to diagnose MRSA infection nor to guide or monitor treatment for MRSA infections. Performed at [facility], [place] [address] [place], [place] [address]       Radiology  Studies: Mr Lumbar Spine W Wo Contrast  Result Date: 06/30/2018 CLINICAL DATA:  Generalized pain, recent fall. History of epidural abscess and spine surgery. EXAM: MRI LUMBAR SPINE WITHOUT AND WITH CONTRAST TECHNIQUE: Multiplanar and multiecho pulse sequences of the lumbar spine were obtained without and with intravenous contrast. CONTRAST:  10 cc Gadavist COMPARISON:  CT lumbar spine June 16, 2018 and CT abdomen and pelvis January 31, 2018 and MRI lumbar spine May 18, 2017. FINDINGS: Moderately motion degraded examination. SEGMENTATION: For the purposes of this report, the last well-formed intervertebral disc is reported as L5-S1. ALIGNMENT: Maintained lumbar lordosis. Grade 1 L5-S1 anterolisthesis, progressed from prior imaging. Stable grade L2-3 retrolisthesis without spondylolysis. VERTEBRAE: Vertebral bodies are intact. Enhancing bright STIR signal through RIGHT L5 pars interarticularis defects, status post interval hardware removal from 2018. Widened T2 bright L5-S1 disc space, increased from prior CT and new from prior MRI. 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 L4-5 interbody fusion material, previously demonstrated arthrodesis. Nonenhancing fluid signal within the posterior L4-5 disc space. Stable moderate L2-3 mild L3-4 disc height loss with mild chronic discogenic endplate changes. Focal fat versus hemangioma S1. Mild enhancing L5-S1 discogenic endplate changes. No suspicious disc enhancement. CONUS MEDULLARIS AND CAUDA EQUINA: Conus medullaris terminates at L1-2 and demonstrates normal morphology and signal characteristics. Central displacement of the cauda equina due to canal stenosis L2-3. Linear low signal LEFT aspect of mid lumbar thecal sac without enhancement, this may reflect adhesion. Abnormal enhancement epidural space from L2-3 through the sacrum decreased from prior imaging. PARASPINAL AND OTHER SOFT TISSUES: Paraspinal muscle denervation. Prevertebral fluid signal and  mild enhancement L5-S1 without discrete fluid collection. DISC LEVELS: L1-2: No disc bulge. Moderate to severe facet arthropathy. No canal stenosis or neural foraminal narrowing. L2-3: Retrolisthesis. Moderate broad-based disc bulge. Severe facet arthropathy. Moderate canal stenosis. Moderate bilateral neural foraminal narrowing. L3-4: Posterior decompression, advanced facet arthropathy. No canal stenosis. Moderate RIGHT and mild-to-moderate LEFT neural foraminal narrowing. L4-5: Posterior decompression. Advanced facet arthropathy. No canal stenosis. Moderate RIGHT and mild LEFT neural foraminal narrowing. L5-S1: Small broad-based disc bulge. Severe facet arthropathy. No canal stenosis. Moderate bilateral neural foraminal narrowing IMPRESSION: 1. Motion degraded examination. Widened L5-S1 disc space with disrupted ALL likely posttraumatic. 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 No convincing discitis osteomyelitis. L5-S1 prevertebral fluid may be posttraumatic or infectious. 2. 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 3. Stable grade 1 L2-3 retrolisthesis, status post L3-4 through L5-S1 posterior decompression. 4. Moderate canal stenosis L2-3. Moderate neural foraminal narrowing L2-3 through L5-S1. Electronically Signed   By: Elon Alas M.D.   On: 06/30/2018 22:25   [name] Ekg Site Rite  Result Date: 07/02/2018 If Site Rite image not attached, placement could not be confirmed due to current cardiac rhythm.       Scheduled Meds: . citalopram  20 mg Oral Daily  . enoxaparin (LOVENOX) injection  40 mg Subcutaneous Daily  . hydrALAZINE  10 mg Oral TID  . labetalol  200 mg Oral BID  . methocarbamol  500 mg Oral BID  . pantoprazole  40 mg Oral Daily  . senna-docusate  1 tablet Oral QHS   Continuous Infusions: . sodium chloride 75 mL/hr at 07/02/18 0433  . cefTRIAXone  (ROCEPHIN)  IV Stopped (07/01/18 1725)     LOS: 2 days    Time spent: More than 50% of that time was spent in counseling and/or coordination of care.      Antonieta Pert, MD [facility] Pager [phone]  If 7PM-7AM, please contact night-coverage [email or web address] Password [email or web address] 07/02/2018, 11:26 AM

## 2018-07-02 NOTE — Plan of Care (Signed)

## 2018-07-02 NOTE — Progress Notes (Signed)
Physical Therapy Treatment Patient Details Name: [name] MRN: [id number] DOB: Sep 21, 1950 Today's Date: 07/02/2018    History of Present Illness Pt is a 67 y/o female presenting to the ED after inability to care for herself at home. Pt with recent admission to [facility] for L humerus fracture and T10 fracture. PMH includes CHF and discitis with epidural abscess.     PT Comments    Patient seen for mobility progression. Good motivation by patient to perform OOB mobility. Initially requiring mod-max A +2 for bed level mobility due to fear of pain and discomfort at shoulder and back, however with positive encouragement by PT/OT able to progress this without pain with patient able to sit EOB x 5 min and then stand at bedside and pivot to recliner  With Mod A +2 for safety. PT feels as if patient would still benefit from short term rehab to progress safe and independent functional mobility as well as pain control, however patient wishing to return home. PT to continue to follow.   Next session: PT/OT please coordinate with patient/daughter for education on transfers, application of brace, back precautions, log rolling.    Follow Up Recommendations  SNF;Supervision/Assistance - 24 hour     Equipment Recommendations  None recommended by PT    Recommendations for Other Services       Precautions / Restrictions Precautions Precautions: Fall;Back Precaution Booklet Issued: No Precaution Comments: Reviewed back precautions with pt. Per previous admission notes TLSO when OOB.  Required Braces or Orthoses: Spinal Brace;Sling Spinal Brace: Thoracolumbosacral orthotic Restrictions Weight Bearing Restrictions: Yes LUE Weight Bearing: Non weight bearing    Mobility  Bed Mobility Overal bed mobility: Needs Assistance Bed Mobility: Rolling;Sidelying to Sit Rolling: Mod assist;Max assist;+2 for physical assistance Sidelying to sit: Mod assist;+2 for physical assistance       General bed  mobility comments: Mod A to sit with PT behind patient for safety and guarding of L UE and with patient using OT as "grab bar" to pull up on   Transfers Overall transfer level: Needs assistance Equipment used: 1 person hand held assist;2 person hand held assist Transfers: Sit to/from [facility] Sit to Stand: Mod assist;+2 safety/equipment Stand pivot transfers: Mod assist;+2 safety/equipment       General transfer comment: pateitn pulling up on OT; PT providing cueing and guarding throughout transfers  Ambulation/Gait             General Gait Details: able to perform static marches with 1 person HHA x 5 and then shuffle steps to recliner from bed   Stairs             Wheelchair Mobility    Modified Rankin (Stroke Patients Only)       Balance Overall balance assessment: Needs assistance;History of Falls Sitting-balance support: Feet supported;Single extremity supported Sitting balance-Leahy Scale: Fair     Standing balance support: Single extremity supported;During functional activity Standing balance-Leahy Scale: Poor Standing balance comment: reliant on single UE support                            Cognition Arousal/Alertness: Awake/alert Behavior During Therapy: WFL for tasks assessed/performed Overall Cognitive Status: Within Functional Limits for tasks assessed                                        Exercises  General Comments        Pertinent Vitals/Pain Pain Assessment: Faces Faces Pain Scale: Hurts whole lot Pain Location: L arm, back  Pain Descriptors / Indicators: Grimacing;Guarding;Moaning Pain Intervention(s): Limited activity within patient's tolerance;Monitored during session;Repositioned    Home Living                      Prior Function            PT Goals (current goals can now be found in the care plan section) Acute Rehab PT Goals Patient Stated Goal: to get better  before going home  PT Goal Formulation: With patient Time For Goal Achievement: 07/13/18 Potential to Achieve Goals: Fair Progress towards PT goals: Progressing toward goals    Frequency    Min 2X/week      PT Plan Current plan remains appropriate    Co-evaluation PT/OT/SLP Co-Evaluation/Treatment: Yes Reason for Co-Treatment: For patient/therapist safety;To address functional/ADL transfers;Complexity of the patient's impairments (multi-system involvement) PT goals addressed during session: Mobility/safety with mobility;Balance;Strengthening/ROM        AM-PAC PT "6 Clicks" Daily Activity  Outcome Measure  Difficulty turning over in bed (including adjusting bedclothes, sheets and blankets)?: Unable Difficulty moving from lying on back to sitting on the side of the bed? : Unable Difficulty sitting down on and standing up from a chair with arms (e.g., wheelchair, bedside commode, etc,.)?: Unable Help needed moving to and from a bed to chair (including a wheelchair)?: A Little Help needed walking in hospital room?: A Little Help needed climbing 3-5 steps with a railing? : A Lot 6 Click Score: 11    End of Session Equipment Utilized During Treatment: Back brace(sling) Activity Tolerance: Patient tolerated treatment well Patient left: in chair;with call bell/phone within reach Nurse Communication: Mobility status PT Visit Diagnosis: Unsteadiness on feet (R26.81);Muscle weakness (generalized) (M62.81);Pain Pain - Right/Left: Left Pain - part of body: Shoulder(back)     Time: 3664-4034 PT Time Calculation (min) (ACUTE ONLY): 45 min  Charges:  $Therapeutic Activity: 8-22 mins                    Lanney Gins, PT, DPT Supplemental Physical Therapist 07/02/18 5:08 PM Pager: [phone] Office: [phone]

## 2018-07-02 NOTE — Care Management (Signed)
..      Durable Medical Equipment  (From admission, onward)         Start     Ordered   07/02/18 1222  For home use only DME Hospital bed  Once    Question Answer Comment  Patient has (list medical condition): discitis, CHF, T10 fracture   The above medical condition requires: Patient requires the ability to reposition immediately   Head must be elevated greater than: 45 degrees   Bed type Semi-electric   [employer] Yes   Trapeze Bar Yes      07/02/18 1223   07/02/18 1221  For home use only DME Other see comment  Once    Comments:  Transfer Board   07/02/18 1221

## 2018-07-02 NOTE — Progress Notes (Signed)
Received call back from Dr Ronnald Ramp office. Spoke to [place] who stated to D/C Bed rest and per Dr Ronnald Ramp notes patient can mobilize with the Brace. PT/OT notified.

## 2018-07-02 NOTE — Progress Notes (Signed)
Called Dr Ronnald Ramp Office concerning Bed rest order. Awaiting call back. Daughter brought in the TLSO brace.

## 2018-07-02 NOTE — Progress Notes (Signed)
PHARMACY CONSULT NOTE FOR:  OUTPATIENT  PARENTERAL ANTIBIOTIC THERAPY (OPAT)  Indication: Osteomyelitis/Discitis Regimen: Ceftriaxone 2 gm every 24 hours End date: 08/25/2018  IV antibiotic discharge orders are pended. To discharging provider:  please sign these orders via discharge navigator,  Select New Orders & click on the button choice - Manage This Unsigned Work.     Thank you for allowing pharmacy to be a part of this patient's care.  Susa Raring, PharmD, BCPS Infectious Diseases Clinical Pharmacist Phone: [phone] 07/02/2018, 10:11 AM

## 2018-07-02 NOTE — Progress Notes (Addendum)
OT Cancellation Note  Patient Details Name: [name] MRN: [id number] DOB: [birth year]   Cancelled Treatment:    Reason Eval/Treat Not Completed: Active bedrest order. Pt is on STRICT bed rest, therapies cannot see this patient (even though it says in the MD progress note to mobilize in brace). OT will continue to follow and will evaluate when bed rest is lifted.   Strict bed rest still in place at 15:45, OT will attempt again tomorrow if bed rest has been lifted. Thank you!  [name] [name] 07/02/2018, 9:55 AM  [name] OTR/L Acute Rehabilitation Services Pager: [phone] Office: [phone]

## 2018-07-02 NOTE — Evaluation (Signed)
Occupational Therapy Evaluation Patient Details Name: [name] MRN: [id number] DOB: 1951/04/04 Today's Date: 07/02/2018    History of Present Illness Pt is a 67 y/o female presenting to the ED after inability to care for herself at home. Pt with recent admission to [facility] for L humerus fracture and T10 fracture. PMH includes CHF and discitis with epidural abscess.    Clinical Impression   PTA Pt struggling at home for transfers and ADL. Pt is currently mod A +2 for transfers, mod A for bed mobility with cues for sequencing and back precautions, Pt is total A for LB dressing, min A for UB ADL requiring BUE, and max A to don brace in sitting. Pt really needs SNF level care at [place] but she is refusing due to her housing situation. She will benefit from skilled OT in the acute setting as well as afterwards at the [facility] level. Next session we will attempt to coordinate with her daughter for transfer training, and Pt will benefit from AE education as well to assist with LB ADL.     Follow Up Recommendations  SNF;Home health OT;Supervision/Assistance - 24 hour(Pt is refusing because of potential of losing housing)    [facility] bed    Recommendations for Other Services       Precautions / Restrictions Precautions Precautions: Fall;Back Precaution Booklet Issued: No Precaution Comments: Reviewed back precautions with pt. Per previous admission notes TLSO when OOB.  Required Braces or Orthoses: Spinal Brace;Sling Spinal Brace: Thoracolumbosacral orthotic Restrictions Weight Bearing Restrictions: Yes LUE Weight Bearing: Non weight bearing      Mobility Bed Mobility Overal bed mobility: Needs Assistance Bed Mobility: Rolling;Sidelying to Sit Rolling: Mod assist;Max assist;+2 for physical assistance Sidelying to sit: Mod assist;+2 for physical assistance       General bed mobility comments: Mod A to sit with PT behind patient for safety and guarding of L UE  and with patient using OT as "grab bar" to pull up on   Transfers Overall transfer level: Needs assistance Equipment used: 2 person hand held assist Transfers: Sit to/from [employer] Transfers Sit to Stand: Mod assist;+2 safety/equipment Stand pivot transfers: Mod assist;+2 safety/equipment       General transfer comment: pateient pulling up on OT; PT providing cueing and guarding throughout transfers    Balance Overall balance assessment: Needs assistance;History of Falls Sitting-balance support: Feet supported;Single extremity supported Sitting balance-Leahy Scale: Fair     Standing balance support: Single extremity supported;During functional activity Standing balance-Leahy Scale: Poor Standing balance comment: reliant on single UE support                           ADL either performed or assessed with clinical judgement   ADL Overall ADL's : Needs assistance/impaired Eating/Feeding: Minimal assistance;Sitting Eating/Feeding Details (indicate cue type and reason): to open containers and set up Grooming: Minimal assistance;Moderate assistance;Sitting   Upper Body Bathing: Maximal assistance;Sitting   Lower Body Bathing: Moderate assistance;+2 for physical assistance;+2 for safety/equipment;Sit to/from stand   Upper Body Dressing : Maximal assistance;Sitting Upper Body Dressing Details (indicate cue type and reason): to don brace and sling Lower Body Dressing: Maximal assistance;+2 for physical assistance;+2 for safety/equipment;Sit to/from stand Lower Body Dressing Details (indicate cue type and reason): unable to bend for LB ADL Toilet Transfer: Moderate assistance;+2 for physical assistance;+2 for safety/equipment(2 person HHA) Toilet Transfer Details (indicate cue type and reason): simulated through recliner transfer [place] and  Hygiene: Maximal assistance;+2 for physical assistance;+2 for safety/equipment;Sit to/from  stand Toileting - Clothing Manipulation Details (indicate cue type and reason): Pt unable to reach peri area at bed level     Functional mobility during ADLs: Minimal assistance;+2 for physical assistance;Min guard;+2 for safety/equipment(2 person HHA, SPT only) General ADL Comments: decreased access to LB for ADL     Vision Patient Visual Report: No change from baseline       Perception     Praxis      Pertinent Vitals/Pain Pain Assessment: Faces Faces Pain Scale: Hurts whole lot Pain Location: L arm, back  Pain Descriptors / Indicators: Grimacing;Guarding;Moaning Pain Intervention(s): Limited activity within patient's tolerance;Monitored during session;Repositioned;Premedicated before session     Hand Dominance Right   Extremity/Trunk Assessment Upper Extremity Assessment Upper Extremity Assessment: LUE deficits/detail RUE Deficits / Details: h/o shoulder sx. Able to feed self with set up and HOB raised LUE Deficits / Details: limited ROM due to pain from fx, sling utilized during transfers and NWB maintained throughout session, able to perform hand, wrist, supination/pronation, elbow flexion triggers pain LUE: Unable to fully assess due to pain;Unable to fully assess due to immobilization LUE Sensation: WNL LUE Coordination: decreased gross motor   Lower Extremity Assessment Lower Extremity Assessment: Defer to PT evaluation   Cervical / Trunk Assessment Cervical / Trunk Assessment: Other exceptions Cervical / Trunk Exceptions: T10 fracture    Communication Communication Communication: No difficulties   Cognition Arousal/Alertness: Awake/alert Behavior During Therapy: WFL for tasks assessed/performed;Anxious Overall Cognitive Status: Within Functional Limits for tasks assessed                                 General Comments: anxious as she associates moving with pain   General Comments  Pt really wants her daughter to be present during transfer  training tomorrow    Exercises Exercises: Other exercises Other Exercises Other Exercises: hand, wrist, forearm, and elbow to pain tolerance   Shoulder Instructions      Home Living Family/patient expects to be discharged to:: Private residence Living Arrangements: Children Available Help at Discharge: Personal care attendant;Family;Available PRN/intermittently Type of Home: Apartment Home Access: Level entry;Elevator     Home Layout: One level     Bathroom Shower/Tub: Occupational psychologist: Handicapped height Bathroom Accessibility: Yes How Accessible: Accessible via walker Home Equipment: [place] - 2 wheels;Bedside commode;Wheelchair - manual;Cane - single point   Additional Comments: daughter currently living with patient and works      Prior Functioning/Environment Level of Independence: Needs assistance  Gait / Transfers Assistance Needed: Since previous admission has needed assist with transfers to [facility]. Reports she has not been moving much since she has been at home.  ADL's / Homemaking Assistance Needed: Requires assist for ADL tasks.    Comments: has aide 2 hours per day        OT Problem List: Decreased strength;Decreased activity tolerance;Impaired balance (sitting and/or standing);Decreased knowledge of use of DME or AE;Pain      OT Treatment/Interventions: Self-care/ADL training;Energy conservation;DME and/or AE instruction;Therapeutic activities;Balance training;Patient/family education    OT Goals(Current goals can be found in the care plan section) Acute Rehab OT Goals Patient Stated Goal: to get better before going home  OT Goal Formulation: With patient Time For Goal Achievement: 07/16/18 Potential to Achieve Goals: Fair ADL Goals Pt Will Perform Lower Body Bathing: with mod assist;with adaptive equipment;sit to/from stand Pt Will  Perform Lower Body Dressing: with max assist;with caregiver independent in assisting;with adaptive  equipment;sit to/from stand Pt Will Transfer to Toilet: with min assist;with +2 assist;stand pivot transfer;bedside commode Pt Will Perform Toileting - Clothing Manipulation and hygiene: with max assist;sit to/from stand;with caregiver independent in assisting Pt/caregiver will Perform Home Exercise Program: Left upper extremity;Independently;With written HEP provided Additional ADL Goal #1: Pt will perform bed mobility, maintaining back precautions at mod A prior to engaging in ADL activity  OT Frequency: Min 2X/week   Barriers to D/C:            Co-evaluation PT/OT/SLP Co-Evaluation/Treatment: Yes Reason for Co-Treatment: For patient/therapist safety;To address functional/ADL transfers;Complexity of the patient's impairments (multi-system involvement) PT goals addressed during session: Mobility/safety with mobility;Balance;Proper use of DME OT goals addressed during session: ADL's and self-care;Proper use of Adaptive equipment and DME;Strengthening/ROM      AM-PAC PT "6 Clicks" Daily Activity     Outcome Measure Help from another person eating meals?: A Little Help from another person taking care of personal grooming?: A Lot Help from another person toileting, which includes using toliet, bedpan, or urinal?: A Lot Help from another person bathing (including washing, rinsing, drying)?: A Lot Help from another person to put on and taking off regular upper body clothing?: A Lot Help from another person to put on and taking off regular lower body clothing?: Total 6 Click Score: 12   End of Session Equipment Utilized During Treatment: Back brace;Other (comment)(sling) Nurse Communication: Mobility status  Activity Tolerance: Patient tolerated treatment well Patient left: with call bell/phone within reach;in chair  OT Visit Diagnosis: Muscle weakness (generalized) (M62.81);Pain;History of falling (Z91.81) Pain - Right/Left: Left Pain - part of body: Shoulder                Time:  7482-7078 OT Time Calculation (min): 45 min Charges:  OT General Charges $OT Visit: 1 Visit OT Evaluation $OT Eval Moderate Complexity: 1 Mod OT Treatments $Self Care/Home Management : 8-22 mins  [name] OTR/L Acute Rehabilitation Services Pager: [phone] Office: [phone]  [name] Stoney Karczewski 07/02/2018, 5:24 PM

## 2018-07-02 NOTE — Progress Notes (Signed)
[facility]   Reason for visit: Follow up on discitis/osteomyelitis  Interval History: on ceftriaxone and no associated rash or diarrhea; pain the same; afebrile and WBC wnl.  No new issues.    Physical Exam: Constitutional:  Vitals:   07/02/18 0412 07/02/18 0913  BP: (!) 166/64 (!) 154/72  Pulse: 73   Resp: 18   Temp: 98.7 F (37.1 C)   SpO2: 98%    patient appears in NAD Eyes: anicteric Respiratory: Normal respiratory effort; CTA B Cardiovascular: RRR GI: soft, nt, nd  Review of Systems: Constitutional: negative for fevers and chills Gastrointestinal: negative for nausea and diarrhea Integument/breast: negative for rash  Lab Results  Component Value Date   WBC 7.3 07/02/2018   HGB 9.3 (L) 07/02/2018   HCT 32.1 (L) 07/02/2018   MCV 86.5 07/02/2018   PLT 283 07/02/2018    Lab Results  Component Value Date   CREATININE 0.89 07/02/2018   BUN 11 07/02/2018   NA 137 07/02/2018   K 4.1 07/02/2018   CL 106 07/02/2018   CO2 23 07/02/2018    Lab Results  Component Value Date   ALT 26 06/29/2018   AST 36 06/29/2018   ALKPHOS 91 06/29/2018     Microbiology: Recent Results (from the past 240 hour(s))  Urine culture     Status: Abnormal   Collection Time: 06/25/18  6:07 PM  Result Value Ref Range Status   Specimen Description   Final    URINE, CLEAN CATCH Performed at [facility], [place] [address], [place], [place] [address]    Special Requests   Final    NONE Performed at [facility], [place] [address] [place], [place] [address]    Culture 20,000 COLONIES/mL PROTEUS MIRABILIS (A)  Final   Report Status 06/28/2018 FINAL  Final   Organism ID, Bacteria PROTEUS MIRABILIS (A)  Final      Susceptibility   Proteus mirabilis - MIC*    AMPICILLIN <=2 SENSITIVE Sensitive     CEFAZOLIN <=4 SENSITIVE Sensitive     CEFTRIAXONE <=1 SENSITIVE Sensitive     CIPROFLOXACIN <=0.25 SENSITIVE Sensitive    GENTAMICIN <=1 SENSITIVE Sensitive     IMIPENEM 1 SENSITIVE Sensitive     NITROFURANTOIN 128 RESISTANT Resistant     TRIMETH/SULFA <=20 SENSITIVE Sensitive     AMPICILLIN/SULBACTAM <=2 SENSITIVE Sensitive     PIP/TAZO <=4 SENSITIVE Sensitive     * 20,000 COLONIES/mL PROTEUS MIRABILIS  Blood culture (routine x 2)     Status: None (Preliminary result)   Collection Time: 06/30/18  1:25 AM  Result Value Ref Range Status   Specimen Description BLOOD RIGHT HAND  Final   Special Requests   Final    BOTTLES DRAWN AEROBIC ONLY Blood Culture adequate volume   Culture   Final    NO GROWTH 2 DAYS Performed at [facility], [address], [place], [place] [address]    Report Status PENDING  Incomplete  Blood culture (routine x 2)     Status: None (Preliminary result)   Collection Time: 06/30/18  1:28 AM  Result Value Ref Range Status   Specimen Description BLOOD RIGHT HAND  Final   Special Requests   Final    BOTTLES DRAWN AEROBIC ONLY Blood Culture adequate volume   Culture   Final    NO GROWTH 2 DAYS Performed at [facility], [place] [address], [place], [place] [address]  Report Status PENDING  Incomplete  MRSA PCR Screening     Status: None   Collection Time: 06/30/18  1:43 AM  Result Value Ref Range Status   MRSA by PCR NEGATIVE NEGATIVE Final    Comment:        The GeneXpert MRSA Assay (FDA approved for NASAL specimens only), is one component of a comprehensive MRSA colonization surveillance program. It is not intended to diagnose MRSA infection nor to guide or monitor treatment for MRSA infections. Performed at [facility], [place] [address] [name], [place] [address]     Impression/Plan:  1. Discitis/osteomyelitis - previous cultures noted.  Will use ceftriaxone for 8 weeks through August 25 2018.   Will monitor ESR, CRP every 2 weeks.  If no improvement, will consider broadening to ertapenem.  2.  Access - I will order a picc line  3.  dispo - will do  OPAT   Pt asking about a hospital bed for home  I will arrange follow up with me in 2-3 weeks

## 2018-07-02 NOTE — Plan of Care (Signed)
  Problem: Activity: Goal: Risk for activity intolerance will decrease Outcome: Progressing   Problem: Coping: Goal: Level of anxiety will decrease Outcome: Progressing   Problem: Pain Managment: Goal: General experience of comfort will improve Outcome: Progressing   Problem: Safety: Goal: Ability to remain free from injury will improve Outcome: Progressing   Problem: Skin Integrity: Goal: Risk for impaired skin integrity will decrease Outcome: Progressing   

## 2018-07-03 ENCOUNTER — Other Ambulatory Visit: Payer: Self-pay

## 2018-07-03 ENCOUNTER — Encounter (HOSPITAL_COMMUNITY): Payer: Self-pay | Admitting: *Deleted

## 2018-07-03 ENCOUNTER — Inpatient Hospital Stay (HOSPITAL_COMMUNITY)
Admission: RE | Admit: 2018-07-03 | Discharge: 2018-07-15 | DRG: 559 | Disposition: A | Discharge status: Home Health | Payer: Medicare Other | Source: Intra-hospital | Attending: Physical Medicine & Rehabilitation | Admitting: Physical Medicine & Rehabilitation

## 2018-07-03 DIAGNOSIS — R6883 Chills (without fever): Secondary | ICD-10-CM

## 2018-07-03 DIAGNOSIS — E7800 Pure hypercholesterolemia, unspecified: Secondary | ICD-10-CM | POA: Diagnosis present

## 2018-07-03 DIAGNOSIS — Z8249 Family history of ischemic heart disease and other diseases of the circulatory system: Secondary | ICD-10-CM

## 2018-07-03 DIAGNOSIS — S42252D Displaced fracture of greater tuberosity of left humerus, subsequent encounter for fracture with routine healing: Secondary | ICD-10-CM | POA: Diagnosis not present

## 2018-07-03 DIAGNOSIS — Z8661 Personal history of infections of the central nervous system: Secondary | ICD-10-CM | POA: Diagnosis not present

## 2018-07-03 DIAGNOSIS — N183 Chronic kidney disease, stage 3 unspecified: Secondary | ICD-10-CM

## 2018-07-03 DIAGNOSIS — S42292D Other displaced fracture of upper end of left humerus, subsequent encounter for fracture with routine healing: Secondary | ICD-10-CM

## 2018-07-03 DIAGNOSIS — M7989 Other specified soft tissue disorders: Secondary | ICD-10-CM | POA: Diagnosis not present

## 2018-07-03 DIAGNOSIS — M47816 Spondylosis without myelopathy or radiculopathy, lumbar region: Secondary | ICD-10-CM | POA: Diagnosis not present

## 2018-07-03 DIAGNOSIS — R52 Pain, unspecified: Secondary | ICD-10-CM

## 2018-07-03 DIAGNOSIS — I5032 Chronic diastolic (congestive) heart failure: Secondary | ICD-10-CM | POA: Diagnosis present

## 2018-07-03 DIAGNOSIS — W19XXXD Unspecified fall, subsequent encounter: Secondary | ICD-10-CM | POA: Diagnosis present

## 2018-07-03 DIAGNOSIS — M5442 Lumbago with sciatica, left side: Secondary | ICD-10-CM | POA: Diagnosis not present

## 2018-07-03 DIAGNOSIS — S22079S Unspecified fracture of T9-T10 vertebra, sequela: Secondary | ICD-10-CM | POA: Diagnosis not present

## 2018-07-03 DIAGNOSIS — M47814 Spondylosis without myelopathy or radiculopathy, thoracic region: Secondary | ICD-10-CM | POA: Diagnosis not present

## 2018-07-03 DIAGNOSIS — R296 Repeated falls: Secondary | ICD-10-CM | POA: Diagnosis present

## 2018-07-03 DIAGNOSIS — M4644 Discitis, unspecified, thoracic region: Secondary | ICD-10-CM | POA: Diagnosis not present

## 2018-07-03 DIAGNOSIS — S42212D Unspecified displaced fracture of surgical neck of left humerus, subsequent encounter for fracture with routine healing: Secondary | ICD-10-CM | POA: Diagnosis not present

## 2018-07-03 DIAGNOSIS — M4624 Osteomyelitis of vertebra, thoracic region: Secondary | ICD-10-CM | POA: Diagnosis not present

## 2018-07-03 DIAGNOSIS — R509 Fever, unspecified: Secondary | ICD-10-CM

## 2018-07-03 DIAGNOSIS — Z803 Family history of malignant neoplasm of breast: Secondary | ICD-10-CM | POA: Diagnosis not present

## 2018-07-03 DIAGNOSIS — Z6841 Body Mass Index (BMI) 40.0 and over, adult: Secondary | ICD-10-CM | POA: Diagnosis not present

## 2018-07-03 DIAGNOSIS — I10 Essential (primary) hypertension: Secondary | ICD-10-CM

## 2018-07-03 DIAGNOSIS — D62 Acute posthemorrhagic anemia: Secondary | ICD-10-CM

## 2018-07-03 DIAGNOSIS — S22079D Unspecified fracture of T9-T10 vertebra, subsequent encounter for fracture with routine healing: Secondary | ICD-10-CM

## 2018-07-03 DIAGNOSIS — I5041 Acute combined systolic (congestive) and diastolic (congestive) heart failure: Secondary | ICD-10-CM | POA: Diagnosis not present

## 2018-07-03 DIAGNOSIS — I130 Hypertensive heart and chronic kidney disease with heart failure and stage 1 through stage 4 chronic kidney disease, or unspecified chronic kidney disease: Secondary | ICD-10-CM | POA: Diagnosis not present

## 2018-07-03 DIAGNOSIS — G8918 Other acute postprocedural pain: Secondary | ICD-10-CM | POA: Diagnosis not present

## 2018-07-03 DIAGNOSIS — G061 Intraspinal abscess and granuloma: Secondary | ICD-10-CM | POA: Diagnosis not present

## 2018-07-03 DIAGNOSIS — M48061 Spinal stenosis, lumbar region without neurogenic claudication: Secondary | ICD-10-CM | POA: Diagnosis not present

## 2018-07-03 DIAGNOSIS — K661 Hemoperitoneum: Secondary | ICD-10-CM | POA: Diagnosis not present

## 2018-07-03 DIAGNOSIS — I5042 Chronic combined systolic (congestive) and diastolic (congestive) heart failure: Secondary | ICD-10-CM | POA: Diagnosis not present

## 2018-07-03 DIAGNOSIS — M62838 Other muscle spasm: Secondary | ICD-10-CM | POA: Diagnosis not present

## 2018-07-03 DIAGNOSIS — M4306 Spondylolysis, lumbar region: Secondary | ICD-10-CM

## 2018-07-03 DIAGNOSIS — S22078A Other fracture of T9-T10 vertebra, initial encounter for closed fracture: Secondary | ICD-10-CM | POA: Diagnosis not present

## 2018-07-03 DIAGNOSIS — M792 Neuralgia and neuritis, unspecified: Secondary | ICD-10-CM | POA: Diagnosis not present

## 2018-07-03 DIAGNOSIS — M5417 Radiculopathy, lumbosacral region: Secondary | ICD-10-CM

## 2018-07-03 DIAGNOSIS — G9341 Metabolic encephalopathy: Secondary | ICD-10-CM | POA: Diagnosis not present

## 2018-07-03 DIAGNOSIS — G062 Extradural and subdural abscess, unspecified: Secondary | ICD-10-CM | POA: Diagnosis present

## 2018-07-03 DIAGNOSIS — R0989 Other specified symptoms and signs involving the circulatory and respiratory systems: Secondary | ICD-10-CM | POA: Diagnosis not present

## 2018-07-03 DIAGNOSIS — J42 Unspecified chronic bronchitis: Secondary | ICD-10-CM | POA: Diagnosis present

## 2018-07-03 DIAGNOSIS — Z90710 Acquired absence of both cervix and uterus: Secondary | ICD-10-CM | POA: Diagnosis not present

## 2018-07-03 DIAGNOSIS — Z87891 Personal history of nicotine dependence: Secondary | ICD-10-CM | POA: Diagnosis not present

## 2018-07-03 DIAGNOSIS — N182 Chronic kidney disease, stage 2 (mild): Secondary | ICD-10-CM

## 2018-07-03 DIAGNOSIS — G8929 Other chronic pain: Secondary | ICD-10-CM | POA: Diagnosis present

## 2018-07-03 DIAGNOSIS — M545 Low back pain: Secondary | ICD-10-CM | POA: Diagnosis present

## 2018-07-03 DIAGNOSIS — S329XXA Fracture of unspecified parts of lumbosacral spine and pelvis, initial encounter for closed fracture: Secondary | ICD-10-CM | POA: Diagnosis not present

## 2018-07-03 DIAGNOSIS — M5441 Lumbago with sciatica, right side: Secondary | ICD-10-CM | POA: Diagnosis not present

## 2018-07-03 DIAGNOSIS — S32059A Unspecified fracture of fifth lumbar vertebra, initial encounter for closed fracture: Secondary | ICD-10-CM | POA: Diagnosis not present

## 2018-07-03 MED ORDER — HYDRALAZINE HCL 25 MG PO TABS: 25.0000 mg | ORAL_TABLET | Freq: Four times a day (QID) | ORAL | Status: DC | PRN

## 2018-07-03 MED ORDER — HYDRALAZINE HCL 25 MG PO TABS
25.0000 mg | ORAL_TABLET | Freq: Three times a day (TID) | ORAL | Status: DC
  Administered 2018-07-03 (×2): 25 mg via ORAL
  Filled 2018-07-03 (×2): qty 1

## 2018-07-03 MED ORDER — OXYCODONE HCL 5 MG PO TABS
5.0000 mg | ORAL_TABLET | ORAL | Status: DC | PRN
  Administered 2018-07-03 – 2018-07-05 (×8): 10 mg via ORAL
  Administered 2018-07-05: 5 mg via ORAL
  Administered 2018-07-05 – 2018-07-06 (×7): 10 mg via ORAL
  Administered 2018-07-07: 5 mg via ORAL
  Administered 2018-07-07 – 2018-07-12 (×18): 10 mg via ORAL
  Filled 2018-07-03: qty 1
  Filled 2018-07-03 (×35): qty 2

## 2018-07-03 MED ORDER — SENNOSIDES-DOCUSATE SODIUM 8.6-50 MG PO TABS
1.0000 | ORAL_TABLET | Freq: Every day | ORAL | Status: DC
  Administered 2018-07-03 – 2018-07-14 (×10): 1 via ORAL
  Filled 2018-07-03 (×12): qty 1

## 2018-07-03 MED ORDER — GABAPENTIN 300 MG PO CAPS
300.0000 mg | ORAL_CAPSULE | Freq: Every day | ORAL | Status: DC | PRN
  Administered 2018-07-04 – 2018-07-06 (×3): 600 mg via ORAL
  Administered 2018-07-07: 300 mg via ORAL
  Filled 2018-07-03 (×2): qty 2
  Filled 2018-07-03: qty 1
  Filled 2018-07-03 (×3): qty 2

## 2018-07-03 MED ORDER — TRAMADOL HCL 50 MG PO TABS
50.0000 mg | ORAL_TABLET | Freq: Four times a day (QID) | ORAL | Status: DC | PRN
  Administered 2018-07-03 – 2018-07-15 (×23): 50 mg via ORAL
  Filled 2018-07-03 (×23): qty 1

## 2018-07-03 MED ORDER — SODIUM CHLORIDE 0.9 % IV SOLN
2.0000 g | INTRAVENOUS | Status: DC
  Administered 2018-07-04 – 2018-07-15 (×12): 2 g via INTRAVENOUS
  Filled 2018-07-03 (×12): qty 20

## 2018-07-03 MED ORDER — SODIUM CHLORIDE 0.9 % IV SOLN: 2.0000 g | INTRAVENOUS | Status: DC

## 2018-07-03 MED ORDER — ONDANSETRON HCL 4 MG/2ML IJ SOLN
4.0000 mg | Freq: Four times a day (QID) | INTRAMUSCULAR | Status: DC | PRN
  Administered 2018-07-09: 4 mg via INTRAVENOUS
  Filled 2018-07-03: qty 2

## 2018-07-03 MED ORDER — ENOXAPARIN SODIUM 40 MG/0.4ML ~~LOC~~ SOLN: 40.0000 mg | SUBCUTANEOUS | Status: DC

## 2018-07-03 MED ORDER — METHOCARBAMOL 500 MG PO TABS: 500.0000 mg | ORAL_TABLET | Freq: Two times a day (BID) | ORAL | 0 refills | Status: DC

## 2018-07-03 MED ORDER — ENOXAPARIN SODIUM 40 MG/0.4ML ~~LOC~~ SOLN
40.0000 mg | Freq: Every day | SUBCUTANEOUS | Status: DC
  Administered 2018-07-04 – 2018-07-12 (×9): 40 mg via SUBCUTANEOUS
  Filled 2018-07-03 (×9): qty 0.4

## 2018-07-03 MED ORDER — SORBITOL 70 % SOLN
30.0000 mL | Freq: Every day | Status: DC | PRN
  Administered 2018-07-08: 30 mL via ORAL
  Filled 2018-07-03: qty 30

## 2018-07-03 MED ORDER — PANTOPRAZOLE SODIUM 40 MG PO TBEC
40.0000 mg | DELAYED_RELEASE_TABLET | Freq: Every day | ORAL | Status: DC
  Administered 2018-07-04 – 2018-07-15 (×12): 40 mg via ORAL
  Filled 2018-07-03 (×12): qty 1

## 2018-07-03 MED ORDER — HYDRALAZINE HCL 25 MG PO TABS
25.0000 mg | ORAL_TABLET | Freq: Three times a day (TID) | ORAL | Status: DC
  Administered 2018-07-03 – 2018-07-09 (×19): 25 mg via ORAL
  Filled 2018-07-03 (×20): qty 1

## 2018-07-03 MED ORDER — LABETALOL HCL 200 MG PO TABS
200.0000 mg | ORAL_TABLET | Freq: Two times a day (BID) | ORAL | Status: DC
  Administered 2018-07-03 – 2018-07-15 (×24): 200 mg via ORAL
  Filled 2018-07-03 (×24): qty 1

## 2018-07-03 MED ORDER — ALBUTEROL SULFATE (2.5 MG/3ML) 0.083% IN NEBU
2.5000 mg | INHALATION_SOLUTION | RESPIRATORY_TRACT | Status: DC | PRN
  Administered 2018-07-09 – 2018-07-14 (×2): 2.5 mg via RESPIRATORY_TRACT
  Filled 2018-07-03 (×2): qty 3

## 2018-07-03 MED ORDER — HYDRALAZINE HCL 25 MG PO TABS: 25.0000 mg | ORAL_TABLET | Freq: Three times a day (TID) | ORAL | Status: DC

## 2018-07-03 MED ORDER — ALUM & MAG HYDROXIDE-SIMETH 200-200-20 MG/5ML PO SUSP: 30.0000 mL | Freq: Four times a day (QID) | ORAL | Status: DC | PRN

## 2018-07-03 MED ORDER — CITALOPRAM HYDROBROMIDE 10 MG PO TABS
20.0000 mg | ORAL_TABLET | Freq: Every day | ORAL | Status: DC
  Administered 2018-07-04 – 2018-07-15 (×12): 20 mg via ORAL
  Filled 2018-07-03 (×12): qty 2

## 2018-07-03 MED ORDER — METHOCARBAMOL 500 MG PO TABS
500.0000 mg | ORAL_TABLET | Freq: Two times a day (BID) | ORAL | Status: DC
  Administered 2018-07-03 – 2018-07-06 (×6): 500 mg via ORAL
  Filled 2018-07-03 (×6): qty 1

## 2018-07-03 MED ORDER — ONDANSETRON HCL 4 MG PO TABS: 4.0000 mg | ORAL_TABLET | Freq: Four times a day (QID) | ORAL | Status: DC | PRN

## 2018-07-03 MED ORDER — SODIUM CHLORIDE 0.9% FLUSH: 10.0000 mL | INTRAVENOUS | Status: DC | PRN

## 2018-07-03 MED ORDER — SODIUM CHLORIDE 0.9% FLUSH
10.0000 mL | INTRAVENOUS | Status: DC | PRN
  Administered 2018-07-03 – 2018-07-13 (×7): 10 mL
  Filled 2018-07-03 (×7): qty 40
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
## 2018-07-03 NOTE — Plan of Care (Signed)

## 2018-07-03 NOTE — Progress Notes (Signed)
Physical Therapy Treatment Patient Details Name: [name] MRN: [id number] DOB: 06-07-1951 Today's Date: 07/03/2018    History of Present Illness Pt is a 67 y/o female presenting to the ED after inability to care for herself at home. Pt with recent admission to [facility] for L humerus fracture and T10 fracture. PMH includes CHF and discitis with epidural abscess.     PT Comments    Patient is making progress toward PT goals and tolerated gait training for 8 ft. Pt requires mod A for all mobility with +2 for safety with transfers and gait progression. Pt will continue to benefit from further skilled PT services both acute and post acute to maximize independence and safety with mobility.    Follow Up Recommendations  SNF;Supervision/Assistance - 24 hour     Equipment Recommendations  None recommended by PT    Recommendations for Other Services       Precautions / Restrictions Precautions Precautions: Fall;Back Precaution Comments: pt recalls 3/3 precautions  Required Braces or Orthoses: Spinal Brace;Sling Spinal Brace: Thoracolumbosacral orthotic Restrictions Weight Bearing Restrictions: Yes LUE Weight Bearing: Non weight bearing    Mobility  Bed Mobility Overal bed mobility: Needs Assistance Bed Mobility: Rolling;Sidelying to Sit Rolling: Mod assist Sidelying to sit: Mod assist;+2 for safety/equipment;HOB elevated       General bed mobility comments: cues for sequencing and technique; use of rail and HOB elevated; assistance to roll hips toward side of bed and assist to elevate trunk into sitting   Transfers Overall transfer level: Needs assistance Equipment used: (assist at trunk with use of gait belt ) Transfers: Sit to/from Stand Sit to Stand: Mod assist;+2 safety/equipment;From elevated surface         General transfer comment: cues for safe hand placement and positiong EOB prior to standing; assist to power up and for balance upon standing    Ambulation/Gait Ambulation/Gait assistance: Mod assist Gait Distance (Feet): 8 Feet Assistive device: 1 person hand held assist Gait Pattern/deviations: Step-through pattern;Decreased step length - right;Decreased step length - left;Decreased dorsiflexion - right;Decreased dorsiflexion - left;Wide base of support(lateral sway) Gait velocity: decreased   General Gait Details: assist at trunk with use of gait belt and R HHA for balance    Stairs             Wheelchair Mobility    Modified Rankin (Stroke Patients Only)       Balance Overall balance assessment: Needs assistance;History of Falls Sitting-balance support: Feet supported;Single extremity supported Sitting balance-Leahy Scale: Fair     Standing balance support: Single extremity supported;During functional activity Standing balance-Leahy Scale: Poor Standing balance comment: reliant on single UE support                            Cognition Arousal/Alertness: Awake/alert Behavior During Therapy: WFL for tasks assessed/performed Overall Cognitive Status: Within Functional Limits for tasks assessed                                        Exercises      General Comments        Pertinent Vitals/Pain Pain Assessment: Faces Faces Pain Scale: Hurts little more Pain Location: L arm, back  Pain Descriptors / Indicators: Guarding;Sore Pain Intervention(s): Limited activity within patient's tolerance;Monitored during session;Repositioned    Home Living  Prior Function            PT Goals (current goals can now be found in the care plan section) Progress towards PT goals: Progressing toward goals    Frequency    Min 2X/week      PT Plan Current plan remains appropriate    Co-evaluation              AM-PAC PT "6 Clicks" Daily Activity  Outcome Measure  Difficulty turning over in bed (including adjusting bedclothes, sheets and  blankets)?: Unable Difficulty moving from lying on back to sitting on the side of the bed? : Unable Difficulty sitting down on and standing up from a chair with arms (e.g., wheelchair, bedside commode, etc,.)?: Unable Help needed moving to and from a bed to chair (including a wheelchair)?: A Little Help needed walking in hospital room?: A Little Help needed climbing 3-5 steps with a railing? : A Lot 6 Click Score: 11    End of Session Equipment Utilized During Treatment: Back brace;Gait belt(sling) Activity Tolerance: Patient tolerated treatment well Patient left: in chair;with call bell/phone within reach Nurse Communication: Mobility status PT Visit Diagnosis: Unsteadiness on feet (R26.81);Muscle weakness (generalized) (M62.81);Pain Pain - Right/Left: Left Pain - part of body: Shoulder(back)     Time: 1434-1500 PT Time Calculation (min) (ACUTE ONLY): 26 min  Charges:  $Gait Training: 8-22 mins $Therapeutic Activity: 8-22 mins                     Earney Navy, PTA Acute Rehabilitation Services Pager: [phone] Office: [phone]     Darliss Cheney 07/03/2018, 4:39 PM
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
## 2018-07-03 NOTE — Progress Notes (Signed)
Patient ID: [name], female   DOB: 1950/09/14, 67 y.o.   MRN: [id number] Patient appears to  Have recurrent discitis and osteomyelitis and is being treated with IV antibiotics per infectious disease.  I do not see anything that requires immediate surgical intervention.  We'll sign off and have her follow-up as an outpatient

## 2018-07-03 NOTE — Progress Notes (Signed)
Patient ID: [name], female   DOB: May 01, 1951, 67 y.o.   MRN: [id number] Admit to unit via bed. Oriented to unit, plan of care, medication and therapy schedule. States an understanding of information reviewed. Margarito Liner

## 2018-07-03 NOTE — PMR Pre-admission (Signed)
PMR Admission Coordinator Pre-Admission Assessment  Patient: [name] is an 67 y.o., female MRN: [id number] DOB: September 14, 1950 Height: 5'3" Weight: 122.5 kg              Insurance Information HMO:      PPO:       PCP:       IPA:       80/20:       OTHER:   PRIMARY: Medicare part B only      Policy#: [id number]      Subscriber: patient CM Name:        Phone#:       Fax#:   Pre-Cert#:        Employer: Disabled Benefits:  Phone #:       Name: Checked in [place] one source [place]. Date: 03/19/14     Deduct:        Out of Pocket Max:        Life Max:   CIR:        SNF:   Outpatient: 80%     Co-Pay: 20% Home Health:        Co-Pay:   DME: 80%     Co-Pay: 20% Providers: patient's choice  SECONDARY: Medicaid [place] access      Policy#: [id number]      Subscriber: Patient CM Name:        Phone#:       Fax#:   Pre-Cert#:        Employer: disabled Benefits:  Phone #: [phone]     Name: Automated Eff. Date: Eligible 07/03/18 with coverage code [id number]     Deduct:        Out of Pocket Max:        Life Max:   CIR:        SNF:   Outpatient:       Co-Pay:   Home Health:        Co-Pay:   DME:       Co-Pay:    Emergency Contact Information Contact Information    Name Relation Home Work Mobile   Bardolph Daughter   [phone]   Braulio Bosch   [phone]   Smith,Julius Brother   [phone]   Jones,Cornella Daughter   [phone]     Current Medical History  Patient Admitting Diagnosis: Hx of falls with left humerus fx, T43fx 10/29 and right L5 pars defect/epidural abscess related to fall on 11/12.    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 Per report patient lives with daughter and has a home health aide 3  hours a day patent through from [facility] and was needing quite a bit of assistance prior to this most recent admission. Daughter works during the day.Patient with recent admission 06/16/2018 to 06/23/2018 after a fall sustaining left proximal humerus neck and greater tuberosity as well as acute minimally displaced fracture T10 vertebral body.Patient was placed in a TLSO back brace per neurosurgery. Nonweightbearing left upper extremity 6 weeks per orthopedic services Dr. Mathis Dad no surgical intervention. Presented 06/30/2018 with increasing back pain. MRI lumbar spine shows acute fracture right L5 pars intra-articularis defect and a new grade 1 L5-S1 anterior listhesis. 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 Patient currently remains on Rocephin as prior  to admission. Scans reviewed by neurosurgery no surgical intervention required. Therapy evaluations have been completed. Patient has adamantly refused skilled nursing facility. M.D. Has requested physical medicine rehabilitation consult.   Past Medical History  Past Medical History:  Diagnosis Date  . Anxiety   . Arthritis    "just about qwhere" (06/30/2018)  . CHF (congestive heart failure) ([place])    [name].......LAST OFFICE VISIT WAS A FEW AGO  . CHF (congestive heart failure) ([place]) 03/06/2015  . Chronic bronchitis ([place])    "just about q yr; use nebulizer prn" (03/17/2015)  . Chronic lower back pain   . Complication of anesthesia    @ [place].....COULDN'T GET HER AWAKE....FOR  HERNIA SURGERY... PLACED ON VENTILATOR; woke up during cyst excision OR on my back"; 07/06/09 VHR: re-intubated in PACU due to hypoventilation, extubated POD#1  . Cyst of right kidney   . Discitis of lumbosacral region 03/06/2015  . 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 GERD (gastroesophageal reflux disease)    takes nexium  . Heart palpitations    takes Metoprolol  . Herpes simplex infection    LEFT  EYE----2 YR AGO  .  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 Hypercholesteremia   . Hypertension    dr [name]  . IBS (irritable bowel syndrome) 03/06/2015  . Intertrigo 09/01/2017  . Klebsiella infection 02/28/2016  . Migraine    "stopped years ago" (06/30/2018)  . Pneumonia    "twice" (06/30/2018)  . Proteus infection 02/28/2016  . Pruritic condition 04/15/2016  . Sleep difficulties    pt. states she had sleep eval > 10 yrs. ago in [place], no apnea found  . 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 denies  . Zoster 03/06/2015    Family History  family history includes Breast cancer in her maternal aunt; Hypertension in her brother, daughter, father, maternal grandfather, maternal grandmother, mother, paternal grandfather, and paternal grandmother.  Prior Rehab/Hospitalizations: Had home PT/OT about 2 yrs ago through [facility]  Has the patient had major surgery during 100 days prior to admission? No  Current Medications   Current Facility-Administered Medications:  .  albuterol (PROVENTIL) (2.5 MG/3ML) 0.083% nebulizer solution 2.5 mg, 2.5 mg, Inhalation, Q4H PRN, Etta Quill, DO .  alum & mag hydroxide-simeth (MAALOX/MYLANTA) 200-200-20 MG/5ML suspension 30 mL, 30 mL, Oral, Q6H PRN, Kc, Ramesh, MD, 30 mL at 07/02/18 1333 .  cefTRIAXone (ROCEPHIN) 2 g in sodium chloride 0.9 % 100 mL IVPB, 2 g, Intravenous, Q24H, Comer, Okey Regal, MD, Last Rate: 200 mL/hr at 07/02/18 1610, 2 g at 07/02/18 1610 .  citalopram (CELEXA) tablet 20 mg, 20 mg, Oral, Daily, Lajean Saver, MD, 20 mg at 07/03/18 0852 .  diphenhydrAMINE (BENADRYL) capsule 50 mg, 50 mg, Oral, Q6H PRN, Kc, Ramesh, MD, 50 mg at 07/02/18 1611 .  enoxaparin (LOVENOX) injection 40 mg, 40 mg, Subcutaneous, Daily, Jennette Kettle M, DO, 40 mg at 07/03/18 0851 .  gabapentin (NEURONTIN) capsule 300-600 mg, 300-600 mg, Oral, Daily PRN, Jennette Kettle M, DO, 300 mg at 07/03/18 1030 .  hydrALAZINE (APRESOLINE) tablet 25 mg, 25 mg, Oral, Q6H PRN, Kc, Ramesh, MD .   hydrALAZINE (APRESOLINE) tablet 25 mg, 25 mg, Oral, TID, Kc, Ramesh, MD, 25 mg at 07/03/18 1159 .  HYDROmorphone (DILAUDID) injection 0.5-1 mg, 0.5-1 mg, Intravenous, Q4H PRN, Etta Quill, DO, 1 mg at 07/03/18 1305 .  labetalol (NORMODYNE) tablet 200 mg, 200 mg, Oral, BID, Lajean Saver, MD, 200 mg at 07/03/18 0852 .  methocarbamol (  ROBAXIN) tablet 500 mg, 500 mg, Oral, BID, Lajean Saver, MD, 500 mg at 07/03/18 0852 .  ondansetron (ZOFRAN) tablet 4 mg, 4 mg, Oral, Q6H PRN **OR** ondansetron (ZOFRAN) injection 4 mg, 4 mg, Intravenous, Q6H PRN, Alcario Drought, Jared M, DO .  pantoprazole (PROTONIX) EC tablet 40 mg, 40 mg, Oral, Daily, Lajean Saver, MD, 40 mg at 07/03/18 0900 .  senna-docusate (Senokot-S) tablet 1 tablet, 1 tablet, Oral, QHS, Gardner, Jared M, DO, 1 tablet at 07/01/18 2300 .  sodium chloride flush (NS) 0.9 % injection 10-40 mL, 10-40 mL, Intracatheter, PRN, Kc, Ramesh, MD  Patients Current Diet:  Diet Order            Diet - low sodium heart healthy        Diet Heart Room service appropriate? Yes; Fluid consistency: Thin  Diet effective now              Precautions / Restrictions Precautions Precautions: Fall, Back Precaution Booklet Issued: No Precaution Comments: Reviewed back precautions with pt. Per previous admission notes TLSO when OOB.  Spinal Brace: Thoracolumbosacral orthotic Restrictions Weight Bearing Restrictions: Yes LUE Weight Bearing: Non weight bearing   Has the patient had 2 or more falls or a fall with injury in the past year?Yes.  Patient reports at least 3 falls in the past year  Prior Activity Level Limited Community (1-2x/wk): Went out 2 X a week.  Went to church on Sundays.  Does not drive.  Home Assistive Devices / Equipment Home Assistive Devices/Equipment: Civil engineer, contracting with back, Environmental consultant (specify type), Cane (specify quad or straight), Eyeglasses, Wheelchair, Grab bars in shower(left arm sling) Home Equipment: Environmental consultant - 2 wheels, Bedside  commode, Wheelchair - manual, Cane - single point  Prior Device Use: Indicate devices/aids used by the patient prior to current illness, exacerbation or injury? Manual wheelchair, Walker and 2 canes  Prior Functional Level Prior Function Level of Independence: Needs assistance Gait / Transfers Assistance Needed: Since previous admission has needed assist with transfers to [facility]. Reports she has not been moving much since she has been at home.  ADL's / Homemaking Assistance Needed: Requires assist for ADL tasks.  Comments: has aide 2 hours per day  Self Care: Did the patient need help bathing, dressing, using the toilet or eating?  Needed some help.  An aide assists with bathing patient's backside  Indoor Mobility: Did the patient need assistance with walking from room to room (with or without device)? Independent  Stairs: Did the patient need assistance with internal or external stairs (with or without device)? Needed some help  Functional Cognition: Did the patient need help planning regular tasks such as shopping or remembering to take medications? Independent  Current Functional Level Cognition  Overall Cognitive Status: Within Functional Limits for tasks assessed Orientation Level: Oriented X4 General Comments: anxious as she associates moving with pain    Extremity Assessment (includes Sensation/Coordination)  Upper Extremity Assessment: LUE deficits/detail RUE Deficits / Details: h/o shoulder sx. Able to feed self with set up and HOB raised LUE Deficits / Details: limited ROM due to pain from fx, sling utilized during transfers and NWB maintained throughout session, able to perform hand, wrist, supination/pronation, elbow flexion triggers pain LUE: Unable to fully assess due to pain, Unable to fully assess due to immobilization LUE Sensation: WNL LUE Coordination: decreased gross motor  Lower Extremity Assessment: Defer to PT evaluation RLE Deficits / Details: Generalized  weakness throughout. Reports numbness and tingling into feet.  LLE Deficits /  Details: Generalized weakness throughout. Reports numbness and tingling into feet.     ADLs  Overall ADL's : Needs assistance/impaired Eating/Feeding: Minimal assistance, Sitting Eating/Feeding Details (indicate cue type and reason): to open containers and set up Grooming: Minimal assistance, Moderate assistance, Sitting Upper Body Bathing: Maximal assistance, Sitting Lower Body Bathing: Moderate assistance, +2 for physical assistance, +2 for safety/equipment, Sit to/from stand Upper Body Dressing : Maximal assistance, Sitting Upper Body Dressing Details (indicate cue type and reason): to don brace and sling Lower Body Dressing: Maximal assistance, +2 for physical assistance, +2 for safety/equipment, Sit to/from stand Lower Body Dressing Details (indicate cue type and reason): unable to bend for LB ADL Toilet Transfer: Moderate assistance, +2 for physical assistance, +2 for safety/equipment(2 person HHA) Toilet Transfer Details (indicate cue type and reason): simulated through recliner transfer Maish Vaya and Hygiene: Maximal assistance, +2 for physical assistance, +2 for safety/equipment, Sit to/from stand Toileting - Clothing Manipulation Details (indicate cue type and reason): Pt unable to reach peri area at bed level Functional mobility during ADLs: Minimal assistance, +2 for physical assistance, Min guard, +2 for safety/equipment(2 person HHA, SPT only) General ADL Comments: decreased access to LB for ADL    Mobility  Overal bed mobility: Needs Assistance Bed Mobility: Rolling, Sidelying to Sit Rolling: Mod assist, Max assist, +2 for physical assistance Sidelying to sit: Mod assist, +2 for physical assistance General bed mobility comments: Mod A to sit with PT behind patient for safety and guarding of L UE and with patient using OT as "grab bar" to pull up on     Transfers  Overall  transfer level: Needs assistance Equipment used: 2 person hand held assist Transfers: Sit to/from Stand, Stand Pivot Transfers Sit to Stand: Mod assist, +2 safety/equipment Stand pivot transfers: Mod assist, +2 safety/equipment General transfer comment: pateient pulling up on OT; PT providing cueing and guarding throughout transfers    Ambulation / Gait / Stairs / Wheelchair Mobility  Ambulation/Gait General Gait Details: able to perform static marches with 1 person HHA x 5 and then shuffle steps to recliner from bed    Posture / Balance Balance Overall balance assessment: Needs assistance, History of Falls Sitting-balance support: Feet supported, Single extremity supported Sitting balance-Leahy Scale: Fair Standing balance support: Single extremity supported, During functional activity Standing balance-Leahy Scale: Poor Standing balance comment: reliant on single UE support    Special needs/care consideration BiPAP/CPAP No CPM No Continuous Drip IV No Dialysis No     Life Vest No Oxygen No Special Bed No Trach Size No Wound Vac (area) No   Skin Has a sling to left shoulder                              Bowel mgmt: Last BM 07/03/18 Bladder mgmt: Has a purwick external catheter in place Diabetic mgmt No    Previous Home Environment Living Arrangements: Children Available Help at Discharge: Personal care attendant, Family, Available PRN/intermittently Type of Home: Apartment Home Layout: One level Home Access: Level entry, Building control surveyor Shower/Tub: Multimedia programmer: Handicapped height Bathroom Accessibility: Yes How Accessible: Accessible via walker Home Care Services: No Type of Home Care Services: Homehealth aide, Home PT, Home OT, [place] (if known): "Home [facility]" Additional Comments: daughter currently living with patient and works  Discharge Living Setting Plans for Discharge Living Setting: Lives with (comment),  Apartment(Daughter and grandson stay with her.) Type of Home  at Discharge: Apartment Discharge Home Layout: One level Discharge Home Access: Level entry Discharge Bathroom Shower/Tub: Tub/shower unit, Curtain Discharge Bathroom Toilet: Handicapped height Discharge Bathroom Accessibility: Yes How Accessible: Accessible via walker Does the patient have any problems obtaining your medications?: No  Social/Family/Support Systems Patient Roles: Parent(Has 2 daughters, 1 son and a grandson) Contact Information: Meredith Mody - daughter Anticipated Caregiver: daughter and grandson Anticipated Caregiver's Contact Information: Leonia Reader - daughter - [phone] Ability/Limitations of Caregiver: Daughters and son and grandson work.  Has a aide 2-3 hours a day, 7 days a week Caregiver Availability: Intermittent Discharge Plan Discussed with Primary Caregiver: Yes Is Caregiver In Agreement with Plan?: Yes Does Caregiver/Family have Issues with Lodging/Transportation while Pt is in Rehab?: No  Goals/Additional Needs Patient/Family Goal for Rehab: PT supervision, OT mod I to supervision to min assist goals Expected length of stay: 15-20 days Cultural Considerations: [employer] Dietary Needs: Heart diet, thin liquids.  Patient follow a low Na diet due to CHF Equipment Needs: TBD Pt/Family Agrees to Admission and willing to participate: Yes Program Orientation Provided & Reviewed with Pt/Caregiver Including Roles  & Responsibilities: Yes  Decrease burden of Care through IP rehab admission: N/A  Possible need for SNF placement upon discharge: Not anticipated  Patient Condition: This patient's condition remains as documented in the consult dated 07/03/18, in which the Rehabilitation Physician determined and documented that the patient's condition is appropriate for intensive rehabilitative care in an inpatient rehabilitation facility. Will admit to inpatient rehab  today.  Preadmission Screen Completed By:  Retta Diones, 07/03/2018 2:39 PM ______________________________________________________________________   Discussed status with Dr. Naaman Plummer on 07/03/18 at 1439 and received telephone approval for admission today.  Admission Coordinator:  Retta Diones, time 1439/Date 07/03/18

## 2018-07-03 NOTE — Progress Notes (Signed)
Patient transferring to [id number]. Report called I to [employer].

## 2018-07-03 NOTE — Progress Notes (Signed)
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  Patient was readmitted 11/2 to the ED with worsening low back pain, in the ER fever of 100.3.  Patient was admitted for further work-up.Patient had elevated ESR, sed rate but no leukocytosis, now no fever episode.  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 No more fever and clinically stable. Seen by neurosurgery and no plan for surgical intervention, seen by infectious disease and plan is to continue with IV ceftriaxone 2gm for 8 weeks through 08/25/18,with ESR, CRP every 2 weeks. S/P PICC line. Cont pain control with opiates, muscle relaxant. Patient is cont with brace and mobilize in the brace. CIR consulted  Morbid Obesity w BMI  >40: Advised weight loss, healthy lifestyle.  Hypertension: Blood pressure poorly controlled likely secondary to pain.Increased hydralazine to 57m 3 times daily, continue Le=abetalol 200 mg twice daily.Cont home meds.  Chronic combined systolic and diastolic CHF, NYHA class 1: Currently compensated. On lasix at home as  prn  Mild AKI, resolved with IV fluids.  Anemia of chronic disease.  Hemoglobin stable around 9 gm   DVT prophylaxis: Subcu Lovenox Code Status: Code Family Communication: No family at the bedside, patient updated. Disposition Plan:  CIR CONSULTED for inpatient rehab.  Subjective: Patient resting, complains of back pain especially with movement. Participated with physical therapy yesterday, able to get up and walk to the bedside chair No bowel bladder incontinence.  No lower extremity weakness.  Objective: Vitals:   07/02/18 0913 07/02/18 2106 07/03/18 0543 07/03/18 1159  BP: (!) 154/72 139/77 (!) 182/89 (!) 166/64  Pulse:  78 79   Resp:  13 16   Temp:  99.1 F (37.3 C) 98.8 F (37.1 C)   TempSrc:  Oral Oral   SpO2:  98% 98%     Intake/Output Summary (Last 24 hours) at 07/03/2018 1325 Last data filed at 07/02/2018 1822 Gross per 24 hour  Intake 240 ml  Output 600 ml  Net -360 ml   There were no vitals filed for this visit. Weight change:   There is no height or weight on file to calculate BMI.  Examination:  General exam: Calm, in mild pain, NAD , OBESE. HEENT:Oral mucosa moist, Ear/Nose WNL grossly Respiratory system: Bilateral equal air entry, no crackles and wheezing Cardiovascular system: S1 & S2 heard, No JVD/murmurs.No pedal edema. Gastrointestinal system: Abdomen soft, Obese, nontender non-distended, BS +. Nervous System:Alert/awake/oriented at baseline.  Able to move UE and LE and non focal. Extremities: No edema,distal peripheral pulses palpable. Lt UE in sling. Skin: No rashes,no icterus. MSK: Normal muscle bulk,tone ,power  Data Reviewed: I have personally reviewed following labs and imaging studies  CBC: Recent Labs  Lab 06/29/18 1255 07/01/18 0208 07/02/18  0134  WBC 11.6* 8.4 7.3  HGB 11.6* 8.9* 9.3*  HCT 41.8 29.6* 32.1*  MCV 86.2 85.5 86.5  PLT 436* 246 092   Basic Metabolic Panel: Recent Labs  Lab 06/29/18 1455 07/01/18 0208  07/02/18 0134  NA 140 135 137  K 4.1 4.0 4.1  CL 106 105 106  CO2 _0 GLUCOSE 105* 110* 96  BUN _1 CREATININE 0.88 1.50* 0.89  CALCIUM 8.8* 8.3* 8.3*   GFR: Estimated Creatinine Clearance: 75.7 mL/min (by C-G formula based on SCr of 0.89 mg/dL). Liver Function Tests: Recent Labs  Lab 06/29/18 1455  AST 36  ALT 26  ALKPHOS 91  BILITOT 1.0  PROT 7.4  ALBUMIN 2.9*   No results for input(s): LIPASE, AMYLASE in the last 168 hours. No results for input(s): AMMONIA in the last 168 hours. Coagulation Profile: No results for input(s): INR, PROTIME in the last 168 hours. Cardiac Enzymes: No results for input(s): CKTOTAL, CKMB, CKMBINDEX, TROPONINI in the last 168 hours. BNP (last 3 results) No results for input(s): PROBNP in the last 8760 hours. HbA1C: No results for input(s): HGBA1C in the last 72 hours. CBG: Recent Labs  Lab 06/30/18 1124 06/30/18 2123 07/01/18 0630  GLUCAP 125* 92 90   Lipid Profile: No results for input(s): CHOL, HDL, LDLCALC, TRIG, CHOLHDL, LDLDIRECT in the last 72 hours. Thyroid Function Tests: No results for input(s): TSH, T4TOTAL, FREET4, T3FREE, THYROIDAB in the last 72 hours. Anemia Panel: No results for input(s): VITAMINB12, FOLATE, FERRITIN, TIBC, IRON, RETICCTPCT in the last 72 hours. Sepsis Labs: Recent Labs  Lab 06/29/18 1500  LATICACIDVEN 1.17    Recent Results (from the past 240 hour(s))  Urine culture     Status: Abnormal   Collection Time: 06/25/18  6:07 PM  Result Value Ref Range Status   Specimen Description   Final    URINE, CLEAN CATCH Performed at [facility] [address], [place], [place] [address]    Special Requests   Final    NONE Performed at [facility], [place] [address], [place], [place] [address]    Culture 20,000 COLONIES/mL PROTEUS MIRABILIS (A)  Final   Report Status 06/28/2018 FINAL  Final   Organism ID, Bacteria PROTEUS MIRABILIS (A)  Final       Susceptibility   Proteus mirabilis - MIC*    AMPICILLIN <=2 SENSITIVE Sensitive     CEFAZOLIN <=4 SENSITIVE Sensitive     CEFTRIAXONE <=1 SENSITIVE Sensitive     CIPROFLOXACIN <=0.25 SENSITIVE Sensitive     GENTAMICIN <=1 SENSITIVE Sensitive     IMIPENEM 1 SENSITIVE Sensitive     NITROFURANTOIN 128 RESISTANT Resistant     TRIMETH/SULFA <=20 SENSITIVE Sensitive     AMPICILLIN/SULBACTAM <=2 SENSITIVE Sensitive     PIP/TAZO <=4 SENSITIVE Sensitive     * 20,000 COLONIES/mL PROTEUS MIRABILIS  Blood culture (routine x 2)     Status: None (Preliminary result)   Collection Time: 06/30/18  1:25 AM  Result Value Ref Range Status   Specimen Description BLOOD RIGHT HAND  Final   Special Requests   Final    BOTTLES DRAWN AEROBIC ONLY Blood Culture adequate volume   Culture   Final    NO GROWTH 3 DAYS Performed at [facility], [address] [place], [place] [address]    Report Status PENDING  Incomplete  Blood culture (routine x 2)     Status: None (Preliminary result)  Collection Time: 06/30/18  1:28 AM  Result Value Ref Range Status   Specimen Description BLOOD RIGHT HAND  Final   Special Requests   Final    BOTTLES DRAWN AEROBIC ONLY Blood Culture adequate volume   Culture   Final    NO GROWTH 3 DAYS Performed at [facility], [address], [place], [place] [address]    Report Status PENDING  Incomplete  MRSA PCR Screening     Status: None   Collection Time: 06/30/18  1:43 AM  Result Value Ref Range Status   MRSA by PCR NEGATIVE NEGATIVE Final    Comment:        The GeneXpert MRSA Assay (FDA approved for NASAL specimens only), is one component of a comprehensive MRSA colonization surveillance program. It is not intended to diagnose MRSA infection nor to guide or monitor treatment for MRSA infections. Performed at [facility], [place] [address] [place], [place] [address]       Radiology Studies: [name] Ekg Site Rite  Result Date: 07/02/2018 If [facility] image not attached, placement could not be confirmed due to current cardiac rhythm.       Scheduled Meds: . citalopram  20 mg Oral Daily  . enoxaparin (LOVENOX) injection  40 mg Subcutaneous Daily  . hydrALAZINE  25 mg Oral TID  . labetalol  200 mg Oral BID  . methocarbamol  500 mg Oral BID  . pantoprazole  40 mg Oral Daily  . senna-docusate  1 tablet Oral QHS   Continuous Infusions: . cefTRIAXone (ROCEPHIN)  IV 2 g (07/02/18 1610)     LOS: 3 days    Time spent: More than 50% of that time was spent in counseling and/or coordination of care. Antonieta Pert, MD [facility] Pager [phone]  If 7PM-7AM, please contact night-coverage [email or web address] Password [email or web address] 07/03/2018, 1:25 PM

## 2018-07-03 NOTE — Progress Notes (Signed)
Peripherally Inserted Central Catheter/Midline Placement  The IV Nurse has discussed with the patient and/or persons authorized to consent for the patient, the purpose of this procedure and the potential benefits and risks involved with this procedure.  The benefits include less needle sticks, lab draws from the catheter, and the patient may be discharged home with the catheter. Risks include, but not limited to, infection, bleeding, blood clot (thrombus formation), and puncture of an artery; nerve damage and irregular heartbeat and possibility to perform a PICC exchange if needed/ordered by physician.  Alternatives to this procedure were also discussed.  Bard Power PICC patient education guide, fact sheet on infection prevention and patient information card has been provided to patient /or left at bedside.    PICC/Midline Placement Documentation  PICC Single Lumen 27/74/12 PICC Right Basilic 41 cm 0 cm (Active)  Indication for Insertion or Continuance of Line Home intravenous therapies (PICC only) 07/03/2018  9:00 AM  Exposed Catheter (cm) 0 cm 07/03/2018  9:00 AM  Site Assessment Clean;Dry;Intact 07/03/2018  9:00 AM  Line Status Flushed;Blood return noted;Saline locked 07/03/2018  9:00 AM  Dressing Type Transparent 07/03/2018  9:00 AM  Dressing Status Clean;Dry;Intact;Antimicrobial disc in place 07/03/2018  9:00 AM  Line Care Connections checked and tightened 07/03/2018  9:00 AM  Dressing Intervention New dressing 07/03/2018  9:00 AM  Dressing Change Due 07/10/18 07/03/2018  9:00 AM       [address] 07/03/2018, 9:43 AM

## 2018-07-03 NOTE — Care Management Important Message (Signed)
Important Message  Patient Details  Name: [name] MRN: [id number] Date of Birth: [birth year]   Medicare Important Message Given:  Yes    Erenest Rasher, RN 07/03/2018, 2:06 PM

## 2018-07-03 NOTE — Progress Notes (Signed)
IP rehab admissions - I met with patient at the bedside.  I gave her rehab booklets.  She would like to come in inpatient rehab prior to home.  She has been cleared by attending MD to admit to CIR.  Bed available and will admit to CIR today.  Call me for questions.  [phone]

## 2018-07-03 NOTE — Care Management Important Message (Signed)
Important Message  Patient Details  Name: [name] MRN: [id number] Date of Birth: 1950/09/23   Medicare Important Message Given:  Yes    Delorean Knutzen [address] 07/03/2018, 3:59 PM
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
## 2018-07-03 NOTE — Discharge Summary (Signed)
Physician Discharge Summary  VRINDA HECKSTALL TDV:[id number] DOB: 1951-02-17 DOA: 06/29/2018  PCP: [name], MD  Admit date: 06/29/2018 Discharge date: 07/03/2018  Admitted From: Home  Disposition: Inpatient rehab   Recommendations for Outpatient Follow-up:  1. Follow up with ID w 2 weekly labs weeks 2. Please obtain BMP/CBC in one week 3. Please follow up on the following pending results:  Home Health: NO Equipment/Devices:NONE.  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  Patient was readmitted 11/2 to the ED with worsening low back pain, in the ER fever of 100.3.  Patient was admitted for further work-up.Patient had elevated ESR, sed rate but no leukocytosis, now no fever episode.  seen by neurosurgery no plan for surgical intervention, seen by infectious disease and to continue with IV ceftriaxone for 8 wk. at this time patient is medically stable for discharge, she is being discharged to inpatient rehabilitation to continue further rehabilitation and she will also continue with IV antibiotics.  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 No more fever and clinically stable. Seen by neurosurgery and no plan for surgical  intervention, seen by infectious disease and plan is to continue with IV ceftriaxone 2gm for 8 weeks through 08/25/18,with ESR, CRP every 2 weeks. S/P PICC line. Cont pain control with opiates, muscle relaxant. Patient is cont with brace and mobilize in the brace. CIR consulted and accepted for inpatient rehab.  Morbid Obesity w BMI  >40: Advised weight loss, healthy lifestyle.  Hypertension: Blood pressure poorly controlled likely secondary to pain.Increased hydralazine to '25mg'$  3 times daily, continue Labetalol 200 mg twice daily.Cont home meds.  Monitor the blood pressure and can increase hydralazine further if needed  Chronic combined systolic and diastolic CHF, NYHA class 1: Currently compensated. On lasix at home as prn  Mild AKI, resolved with IV fluids.  Anemia of chronic disease.  Hemoglobin stable around 9 gm   Discharge Instructions Cont ceftriaxone 2 g for 8 weeks with 2 weekly CBC can panel ESR  Discharge Instructions    Diet - low sodium heart healthy   Complete by:  As directed    Increase activity slowly   Complete by:  As directed      Allergies as of 07/03/2018      Reactions   Vancomycin Other (See Comments)   Drug induced NEUTROPENIA   Aspirin Other (See Comments)   Stomach bleeding   Ibuprofen Other (See Comments)   Stomach bleeding   Mushroom Extract Complex Hives, Itching   Shellfish Allergy Hives, Itching   Sulfa Antibiotics Hives, Itching   Sulfasalazine Itching, Hives   Tylenol [acetaminophen] Itching   Betadine [povidone Iodine] Itching   Coconut Flavor Itching   Codeine Itching   Eggs Or Egg-derived Products Nausea And Vomiting   Iodine Itching   Ivp Dye [iodinated Diagnostic Agents] Hives   Takes Benadryl '50mg'$  PO before receiving iodinated contrast   Metrizamide  Hives   Takes Benadryl '50mg'$  PO before receiving iodinated contrast      Medication List    TAKE these medications   VENTOLIN HFA 108 (90 Base) MCG/ACT inhaler Generic drug:   albuterol Inhale 2 puffs into the lungs every 4 (four) hours as needed for wheezing or shortness of breath.   albuterol (2.5 MG/3ML) 0.083% nebulizer solution Commonly known as:  PROVENTIL Take 2.5 mg by nebulization every 6 (six) hours as needed for wheezing.   cefTRIAXone 2 g in sodium chloride 0.9 % 100 mL Inject 2 g into the vein daily.   citalopram 20 MG tablet Commonly known as:  CELEXA Take 1 tablet (20 mg total) by mouth daily.   diphenhydrAMINE 25 MG tablet Commonly known as:  BENADRYL Take 25 mg by mouth every 6 (six) hours as needed for itching.   furosemide 80 MG tablet Commonly known as:  LASIX Take 80 mg by mouth daily as needed for fluid.   gabapentin 300 MG capsule Commonly known as:  NEURONTIN Take 300-600 mg by mouth daily as needed (nerve pain).   hydrALAZINE 25 MG tablet Commonly known as:  APRESOLINE Take 1 tablet (25 mg total) by mouth 3 (three) times daily. What changed:    medication strength  how much to take   labetalol 200 MG tablet Commonly known as:  NORMODYNE Take 1 tablet (200 mg total) by mouth 2 (two) times daily.   methocarbamol 500 MG tablet Commonly known as:  ROBAXIN Take 1 tablet (500 mg total) by mouth 2 (two) times daily for 7 days.   NEXIUM 40 MG capsule Generic drug:  esomeprazole Take 40 mg by mouth daily.   ondansetron 4 MG tablet Commonly known as:  ZOFRAN Take 1 tablet (4 mg total) by mouth every 6 (six) hours as needed for nausea or vomiting.   oxyCODONE-acetaminophen 5-325 MG tablet Commonly known as:  PERCOCET/ROXICET Take 1 tablet by mouth every 6 (six) hours as needed for severe pain.   polyethylene glycol packet Commonly known as:  MIRALAX / GLYCOLAX Take 17 g by mouth daily.   senna-docusate 8.6-50 MG tablet Commonly known as:  Senokot-S Take 1 tablet by mouth at bedtime.   simvastatin 20 MG tablet Commonly known as:  ZOCOR Take 20 mg by mouth every evening.            Durable Medical  Equipment  (From admission, onward)         Start     Ordered   07/02/18 1222  For home use only DME Hospital bed  Once    Question Answer Comment  Patient has (list medical condition): discitis, CHF, T10 fracture   The above medical condition requires: Patient requires the ability to reposition immediately   Head must be elevated greater than: 45 degrees   Bed type Semi-electric   [employer] Yes   [place] Yes      07/02/18 1223   07/02/18 1221  For home use only DME Other see comment  Once    Comments:  Transfer Board   07/02/18 [place] Follow up.   Specialty:  Home Health Services Why:  Home Health RN, Physical Therapy, Occupational Therapy, aide, Social Worker-agency will call to arrange visit Contact information: [address] [place] [address] [phone]        Eustace Moore, MD. Schedule an appointment as soon as  possible for a visit in 4 week(s).   Specialty:  Neurosurgery Contact information: [address] [place]  [address] [phone]          Allergies  Allergen Reactions  . Vancomycin Other (See Comments)    Drug induced NEUTROPENIA  . Aspirin Other (See Comments)    Stomach bleeding  . Ibuprofen Other (See Comments)    Stomach bleeding  . Mushroom Extract Complex Hives and Itching  . Shellfish Allergy Hives and Itching  . Sulfa Antibiotics Hives and Itching  . Sulfasalazine Itching and Hives  . Tylenol [Acetaminophen] Itching  . Betadine [Povidone Iodine] Itching  . Coconut Flavor Itching  . Codeine Itching  . Eggs Or Egg-Derived Products Nausea And Vomiting  . Iodine Itching  . Ivp Dye [Iodinated Diagnostic Agents] Hives    Takes Benadryl '50mg'$  PO before receiving iodinated contrast   . Metrizamide Hives    Takes Benadryl '50mg'$  PO before receiving iodinated contrast    Consultations:  Neurosurgery   Infectious  disease   Procedures/Studies: Ct Abdomen Pelvis Wo Contrast  Result Date: 06/17/2018 CLINICAL DATA:  Initial evaluation for acute trauma, fall. EXAM: CT ABDOMEN AND PELVIS WITHOUT CONTRAST TECHNIQUE: Multidetector CT imaging of the abdomen and pelvis was performed following the standard protocol without IV contrast. COMPARISON:  Prior CT from 01/31/2018. FINDINGS: Lower chest: Small layering right pleural effusion. Bibasilar atelectatic changes. 14 mm densely calcified nodule noted at the left lower lobe. Calcified left hilar adenopathy partially visualized. Findings consistent with prior granulomatous infection. Partially visualized lungs are otherwise clear. Hepatobiliary: Limited noncontrast evaluation of the liver is unremarkable. Gallbladder peers to be absent. No appreciable biliary dilatation. Pancreas: Pancreas within normal limits. Spleen: Scattered calcified granulomas noted within the spleen. Spleen otherwise grossly unremarkable. Adrenals/Urinary Tract: Adrenal glands grossly unremarkable. Kidneys equal in size 3.2 cm cyst present within the interpolar right kidney. No nephrolithiasis or hydronephrosis. No appreciable hydroureter. 4 mm calcific density positioned near the level of the right UPJ favored to reflect a vascular phlebolith. Partially distended bladder within normal limits. Stomach/Bowel: Small hiatal hernia noted. Stomach otherwise unremarkable. No evidence for bowel obstruction. Colonic diverticulosis without evidence for acute diverticulitis. No acute inflammatory changes seen about the bowels. Vascular/Lymphatic: Limited noncontrast evaluation of the intra-abdominal aorta are grossly unremarkable. No appreciable aneurysm. No adenopathy. Reproductive: Uterus is absent.  Ovaries not discretely identified. Other: No free intraperitoneal air. Ill-defined stranding within the retroperitoneal space, extending along the psoas musculature bilaterally, concerning for retroperitoneal  hematoma. Small amount of hemorrhage present within the central pelvis as well. Sequelae of prior ventral hernia repair noted. Musculoskeletal: Acute fracture involving the anterior-inferior end plate of E33, better evaluated on prior CT of the thoracic spine. No other acute osseous abnormality. Postsurgical changes noted within the thoracolumbar spine. No discrete lytic or blastic osseous lesions. IMPRESSION: 1. Scattered soft tissue stranding within the retroperitoneum and pelvis, consistent with acute hemorrhage, overall small to moderate in volume. No definite visceral injury identified on this noncontrast examination. 2. No other acute traumatic injury within the abdomen and pelvis. 3. Small layering right pleural effusion with associated bibasilar atelectasis. 4. Colonic diverticulosis without evidence for acute diverticulitis. Critical Value/emergent results were called by telephone at the time of interpretation on 06/17/2018 at 12:06 am to Dr. Shela Leff , who verbally acknowledged these results. Electronically Signed   By: Jeannine Boga M.D.   On: 06/17/2018 00:13   Dg Lumbar Spine Complete  Result Date: 06/16/2018 CLINICAL DATA:  Low  back pain after fall. EXAM: LUMBAR SPINE - COMPLETE 4+ VIEW COMPARISON:  CT abdomen pelvis dated January 31, 2018. FINDINGS: Five lumbar type vertebral bodies. Prior L3-S1 posterior decompression and L4-L5 interbody fusion. No acute fracture or subluxation. Vertebral body heights are preserved. Unchanged trace retrolisthesis at L2-L3 and trace anterolisthesis at L4-L5. Stable mild disc height loss at L2-L3, L3-L4, and L5-S1. Moderate facet arthropathy at L5-S1 IMPRESSION: 1.  No acute osseous abnormality. 2. Stable lumbar spondylosis and postsurgical changes. Electronically Signed   By: Titus Dubin M.D.   On: 06/16/2018 15:42   Ct Head Wo Contrast  Result Date: 06/17/2018 CLINICAL DATA:  Encephalopathy. Fell face down yesterday. EXAM: CT HEAD WITHOUT  CONTRAST TECHNIQUE: Contiguous axial images were obtained from the base of the skull through the vertex without intravenous contrast. COMPARISON:  None. FINDINGS: Brain: Mild patchy white matter low density in both cerebral hemispheres. Normal size and position of the ventricles. No intracranial hemorrhage, mass lesion or CT evidence of acute infarction. Vascular: No hyperdense vessel or unexpected calcification. Skull: Bilateral hyperostosis frontalis. No skull fractures seen. Sinuses/Orbits: Status post cataract extraction on the left. Unremarkable right orbit and paranasal sinuses. Other: None. IMPRESSION: 1. No acute abnormality. 2. Mild chronic small vessel white matter ischemic changes in both cerebral hemispheres. Electronically Signed   By: Claudie Revering M.D.   On: 06/17/2018 17:02   Ct Thoracic Spine Wo Contrast  Result Date: 06/16/2018 CLINICAL DATA:  Initial evaluation for acute back pain status post fall. EXAM: CT THORACIC AND LUMBAR SPINE WITHOUT CONTRAST TECHNIQUE: Multidetector CT imaging of the thoracic and lumbar spine was performed without contrast. Multiplanar CT image reconstructions were also generated. COMPARISON:  Prior radiograph from earlier the same day as well as earlier studies. FINDINGS: CT THORACIC SPINE FINDINGS Alignment: Examination technically limited by body habitus and motion artifact. Vertebral bodies normally aligned with preservation of the normal thoracic kyphosis. No listhesis or malalignment. Vertebrae: Linear lucency extending through the anterior aspect of the T10 vertebral body consistent with acute minimally displaced fracture (9, image 32). Extension through the adjacent inferior endplate and [id number] interspace without appreciable distraction. No significant vertebral body height loss. This is also seen on coronal sequence (series 8, image 24). Possible extension to adjacent pole the endplate osteophytes. Vertebral body heights otherwise maintained with no other  definite acute fracture identified. 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 Postsurgical changes from prior posterior decompression at T10-11 and T11-12 noted. Paraspinal and other soft tissues: Chronic postsurgical changes present within the lower posterior paraspinous soft tissues. Paraspinous soft tissues demonstrate no acute abnormality. Layering right pleural effusion partially visualized. Atelectatic changes noted within the lungs bilaterally. Disc levels: T11-12: Large central disc osteophyte complex noted. Prior decompressive laminectomy. Additional multilevel scattered endplate changes noted throughout the mid and lower thoracic spine. Multilevel facet arthropathy noted. No other significant canal stenosis. CT LUMBAR SPINE FINDINGS Segmentation: Examination technically limited by body habitus and motion artifact. Normal segmentation. Lowest well-formed disc labeled the L5-S1 level. Alignment: Mild dextroscoliosis. Trace retrolisthesis of L2 on L3. Alignment otherwise normal with preservation of the normal lumbar lordosis. Vertebrae: Vertebral body heights maintained without evidence for acute or chronic fracture. Postsurgical changes from prior decompressive laminectomy at L3-4 through L5-S1. Interbody device in place at L4-5. Prior hardware removal at L4 and L5. Probable benign hemangiomas involving the L4 and S1 vertebral bodies noted. No other discrete or worrisome osseous lesions. Paraspinal and other soft tissues: Chronic postsurgical changes present within the  lower posterior paraspinous soft tissues. Paraspinous soft tissues demonstrate no acute finding. Disc levels: L1-2: Minimal annular disc bulge. Moderate bilateral facet hypertrophy. No significant stenosis. L2-3: Trace retrolisthesis. Diffuse disc bulge with intervertebral disc space narrowing. Advanced bilateral facet hypertrophy. Resultant moderate canal with moderate to severe bilateral L2 foraminal  stenosis. L3-4: Mild disc bulge. Prior decompressive laminectomy. Moderate to advanced bilateral facet hypertrophy. No significant spinal stenosis. Severe left with mild to moderate right L3 foraminal narrowing. L4-5: Prior discectomy with interbody fusion, with posterior decompression. Residual bilateral facet arthrosis. No residual canal stenosis. Mild to moderate bilateral L4 foraminal narrowing. L5-S1: Diffuse disc bulge. Prior posterior decompression. Advanced bilateral facet arthrosis. No significant residual spinal stenosis. Severe bilateral L5 foraminal narrowing. IMPRESSION: CT THORACIC SPINE IMPRESSION 1. Acute minimally displaced fracture extending through the anterior-inferior aspect of the T10 vertebral body as above. No associated listhesis or malalignment. 2. No other acute traumatic injury identified within the thoracic spine. 3. Large central disc osteophyte complex at T11-12 with sequelae of prior decompressive laminectomy at T10-11 and T11-12. 4. Layering right pleural effusion with bibasilar atelectatic changes. CT LUMBAR SPINE IMPRESSION 1. No acute traumatic injury within the lumbar spine. 2. Postsurgical changes from prior posterior decompression at L3-4 through L5-S1, with interbody fusion at L4-5. 3. 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 Resultant moderate spinal stenosis at L2-3, with moderate to advanced multilevel foraminal narrowing, most notable at L5-S1 bilaterally. Electronically Signed   By: Jeannine Boga M.D.   On: 06/16/2018 23:48   Ct Lumbar Spine Wo Contrast  Result Date: 06/16/2018 CLINICAL DATA:  Initial evaluation for acute back pain status post fall. EXAM: CT THORACIC AND LUMBAR SPINE WITHOUT CONTRAST TECHNIQUE: Multidetector CT imaging of the thoracic and lumbar spine was performed without contrast. Multiplanar CT image reconstructions were also generated. COMPARISON:  Prior radiograph from earlier the same day as well as  earlier studies. FINDINGS: CT THORACIC SPINE FINDINGS Alignment: Examination technically limited by body habitus and motion artifact. Vertebral bodies normally aligned with preservation of the normal thoracic kyphosis. No listhesis or malalignment. Vertebrae: Linear lucency extending through the anterior aspect of the T10 vertebral body consistent with acute minimally displaced fracture (9, image 32). Extension through the adjacent inferior endplate and [id number] interspace without appreciable distraction. No significant vertebral body height loss. This is also seen on coronal sequence (series 8, image 24). Possible extension to adjacent pole the endplate osteophytes. Vertebral body heights otherwise maintained with no other definite acute fracture identified. 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 Postsurgical changes from prior posterior decompression at T10-11 and T11-12 noted. Paraspinal and other soft tissues: Chronic postsurgical changes present within the lower posterior paraspinous soft tissues. Paraspinous soft tissues demonstrate no acute abnormality. Layering right pleural effusion partially visualized. Atelectatic changes noted within the lungs bilaterally. Disc levels: T11-12: Large central disc osteophyte complex noted. Prior decompressive laminectomy. Additional multilevel scattered endplate changes noted throughout the mid and lower thoracic spine. Multilevel facet arthropathy noted. No other significant canal stenosis. CT LUMBAR SPINE FINDINGS Segmentation: Examination technically limited by body habitus and motion artifact. Normal segmentation. Lowest well-formed disc labeled the L5-S1 level. Alignment: Mild dextroscoliosis. Trace retrolisthesis of L2 on L3. Alignment otherwise normal with preservation of the normal lumbar lordosis. Vertebrae: Vertebral body heights maintained without evidence for acute or chronic fracture. Postsurgical changes from prior  decompressive laminectomy at L3-4 through L5-S1. Interbody device in place at L4-5. Prior hardware removal at L4 and L5. Probable benign  hemangiomas involving the L4 and S1 vertebral bodies noted. No other discrete or worrisome osseous lesions. Paraspinal and other soft tissues: Chronic postsurgical changes present within the lower posterior paraspinous soft tissues. Paraspinous soft tissues demonstrate no acute finding. Disc levels: L1-2: Minimal annular disc bulge. Moderate bilateral facet hypertrophy. No significant stenosis. L2-3: Trace retrolisthesis. Diffuse disc bulge with intervertebral disc space narrowing. Advanced bilateral facet hypertrophy. Resultant moderate canal with moderate to severe bilateral L2 foraminal stenosis. L3-4: Mild disc bulge. Prior decompressive laminectomy. Moderate to advanced bilateral facet hypertrophy. No significant spinal stenosis. Severe left with mild to moderate right L3 foraminal narrowing. L4-5: Prior discectomy with interbody fusion, with posterior decompression. Residual bilateral facet arthrosis. No residual canal stenosis. Mild to moderate bilateral L4 foraminal narrowing. L5-S1: Diffuse disc bulge. Prior posterior decompression. Advanced bilateral facet arthrosis. No significant residual spinal stenosis. Severe bilateral L5 foraminal narrowing. IMPRESSION: CT THORACIC SPINE IMPRESSION 1. Acute minimally displaced fracture extending through the anterior-inferior aspect of the T10 vertebral body as above. No associated listhesis or malalignment. 2. No other acute traumatic injury identified within the thoracic spine. 3. Large central disc osteophyte complex at T11-12 with sequelae of prior decompressive laminectomy at T10-11 and T11-12. 4. Layering right pleural effusion with bibasilar atelectatic changes. CT LUMBAR SPINE IMPRESSION 1. No acute traumatic injury within the lumbar spine. 2. Postsurgical changes from prior posterior decompression at L3-4 through L5-S1, with  interbody fusion at L4-5. 3. 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 Resultant moderate spinal stenosis at L2-3, with moderate to advanced multilevel foraminal narrowing, most notable at L5-S1 bilaterally. Electronically Signed   By: Jeannine Boga M.D.   On: 06/16/2018 23:48   Mr Thoracic Spine Wo Contrast  Addendum Date: 06/29/2018   ADDENDUM REPORT: 06/29/2018 22:09 ADDENDUM: Study discussed by telephone with Dr. Reinaldo Meeker in the ED on 06/29/2018 at 2202 hours. He advised that the patient has been febrile recently, and so that rationale for the attempted spine MRI was possible spinal infection. We discussed that although I do not have enough imaging to exclude a T10-T11 discitis, I have no suspicion of paraspinal phlegmon to strongly suggest an infection. And furthermore we know the inferior T10 level was fracture based on the October CT. We discussed that a complete MRI exam when the patient is better able to tolerate would be valuable. Electronically Signed   By: Genevie Ann M.D.   On: 06/29/2018 22:09   Result Date: 06/29/2018 CLINICAL DATA:  67 year old female with generalized pain, weakness. Recently hospitalized after a fall with T10 compression fracture. EXAM: MRI THORACIC SPINE WITHOUT CONTRAST TECHNIQUE: Multiplanar, multisequence MR imaging of the thoracic spine was performed. No intravenous contrast was administered. COMPARISON:  CT thoracic and lumbar spine 06/16/2018. FINDINGS: The examination had to be discontinued prior to completion due to pain. Only sagittal T2 weighted imaging, along with a sagittal T1 weighted scout image, could be obtained. Alignment: Thoracic vertebral alignment appears stable since 06/16/2018. No spondylolisthesis is evident. Vertebrae: Abnormal appearance of the T10 inferior endplate and J19-J47 disc space. The anterior inferior T10 endplate was fractured on the comparison. There is increased T2 signal within the T10-T11 disc space  which appears conspicuously widened (series 27, image 10). However, T10 vertebral body height is relatively maintained. Underlying previous T10-T11 and T11-T12 posterior decompression. Cord: Limited. No definite thoracic spinal cord signal abnormality. Paraspinal and other soft tissues: Postoperative changes to the lower thoracic and lumbar posterior paraspinal soft tissues. Disc levels: No definite thoracic spinal stenosis  at or above T10-T11. Suspect residual mild spinal stenosis at T11-T12 where bulky posterior disc osteophyte complex was noted on the CT last month. IMPRESSION: 1. Limited exam, only somewhat motion degraded sagittal T2 weighted imaging could be obtained as the examination was discontinued due to patient pain. 2. Fracture through the T10 inferior endplate seen in October is superimposed on previous T10-T11 laminectomy. And there is abnormal signal in that disc space which appears widened. This constellation is highly suspicious for an unstable T10-T11 injury through the disc space. Recommend full spine precautions and Spine Surgery consultation. 3. The T11-T12 level was also previously posteriorly decompressed and mild residual spinal stenosis is suspected there. Electronically Signed: By: Genevie Ann M.D. On: 06/29/2018 21:59   Mr Lumbar Spine W Wo Contrast  Result Date: 06/30/2018 CLINICAL DATA:  Generalized pain, recent fall. History of epidural abscess and spine surgery. EXAM: MRI LUMBAR SPINE WITHOUT AND WITH CONTRAST TECHNIQUE: Multiplanar and multiecho pulse sequences of the lumbar spine were obtained without and with intravenous contrast. CONTRAST:  10 cc Gadavist COMPARISON:  CT lumbar spine June 16, 2018 and CT abdomen and pelvis January 31, 2018 and MRI lumbar spine May 18, 2017. FINDINGS: Moderately motion degraded examination. SEGMENTATION: For the purposes of this report, the last well-formed intervertebral disc is reported as L5-S1. ALIGNMENT: Maintained lumbar lordosis.  Grade 1 L5-S1 anterolisthesis, progressed from prior imaging. Stable grade L2-3 retrolisthesis without spondylolysis. VERTEBRAE: Vertebral bodies are intact. Enhancing bright STIR signal through RIGHT L5 pars interarticularis defects, status post interval hardware removal from 2018. Widened T2 bright L5-S1 disc space, increased from prior CT and new from prior MRI. 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 L4-5 interbody fusion material, previously demonstrated arthrodesis. Nonenhancing fluid signal within the posterior L4-5 disc space. Stable moderate L2-3 mild L3-4 disc height loss with mild chronic discogenic endplate changes. Focal fat versus hemangioma S1. Mild enhancing L5-S1 discogenic endplate changes. No suspicious disc enhancement. CONUS MEDULLARIS AND CAUDA EQUINA: Conus medullaris terminates at L1-2 and demonstrates normal morphology and signal characteristics. Central displacement of the cauda equina due to canal stenosis L2-3. Linear low signal LEFT aspect of mid lumbar thecal sac without enhancement, this may reflect adhesion. Abnormal enhancement epidural space from L2-3 through the sacrum decreased from prior imaging. PARASPINAL AND OTHER SOFT TISSUES: Paraspinal muscle denervation. Prevertebral fluid signal and mild enhancement L5-S1 without discrete fluid collection. DISC LEVELS: L1-2: No disc bulge. Moderate to severe facet arthropathy. No canal stenosis or neural foraminal narrowing. L2-3: Retrolisthesis. Moderate broad-based disc bulge. Severe facet arthropathy. Moderate canal stenosis. Moderate bilateral neural foraminal narrowing. L3-4: Posterior decompression, advanced facet arthropathy. No canal stenosis. Moderate RIGHT and mild-to-moderate LEFT neural foraminal narrowing. L4-5: Posterior decompression. Advanced facet arthropathy. No canal stenosis. Moderate RIGHT and mild LEFT neural foraminal narrowing. L5-S1: Small broad-based disc bulge. Severe facet arthropathy. No canal  stenosis. Moderate bilateral neural foraminal narrowing IMPRESSION: 1. Motion degraded examination. Widened L5-S1 disc space with disrupted ALL likely posttraumatic. 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 No convincing discitis osteomyelitis. L5-S1 prevertebral fluid may be posttraumatic or infectious. 2. 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 3. Stable grade 1 L2-3 retrolisthesis, status post L3-4 through L5-S1 posterior decompression. 4. Moderate canal stenosis L2-3. Moderate neural foraminal narrowing L2-3 through L5-S1. Electronically Signed   By: Elon Alas M.D.   On: 06/30/2018 22:25   US Renal  Result Date: 06/18/2018 CLINICAL DATA:  Elevation of creatinine.  EXAM: RENAL / URINARY TRACT ULTRASOUND COMPLETE COMPARISON:  CT 06/16/2018. FINDINGS: Right Kidney: Renal measurement: 9.4 cm. Echogenicity within normal limits. 2.9 cm simple cyst right kidney. No hydronephrosis visualized. Left Kidney: Renal measurement: Not visualized. Exam very limited due to patient's body habitus and left arm injury. Bladder: Appears normal for degree of bladder distention. IMPRESSION: 1. Left kidney not visualized due to patient's body habitus and inability to move left arm. 2.  2.9 cm simple cyst right kidney. Electronically Signed   By: Marcello Moores  Register   On: 06/18/2018 12:13   Ct Hip Left Wo Contrast  Result Date: 06/16/2018 CLINICAL DATA:  Left hip pain. EXAM: CT OF THE LEFT HIP WITHOUT CONTRAST TECHNIQUE: Multidetector CT imaging of the left hip was performed according to the standard protocol. Multiplanar CT image reconstructions were also generated. COMPARISON:  None. FINDINGS: Bones/Joint/Cartilage No fracture or dislocation. Normal alignment. No joint effusion. Mild left hip joint space narrowing with tiny marginal osteophytes. Mild osteoarthritis of bilateral sacroiliac. No  aggressive osseous lesion. No periosteal reaction or bone destruction. Ligaments Ligaments are suboptimally evaluated by CT. Muscles and Tendons Muscles are normal. No muscle atrophy. No intramuscular fluid collection or hematoma. Soft tissue No fluid collection or hematoma.  No soft tissue mass. IMPRESSION: 1.  No acute osseous injury of the left hip. 2. 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 Electronically Signed   By: Kathreen Devoid   On: 06/16/2018 17:10   Dg Chest Port 1 View  Result Date: 06/29/2018 CLINICAL DATA:  67 year old female with a history of CHF EXAM: PORTABLE CHEST 1 VIEW COMPARISON:  06/16/2018 FINDINGS: Cardiomediastinal silhouette unchanged in size and contour. No pneumothorax. No pleural effusion. No confluent airspace disease. No interlobular septal thickening. Comminuted fracture of the proximal left humerus. Surgical changes of the right proximal humerus. Degenerative changes of the acromioclavicular joints. IMPRESSION: Negative for acute cardiopulmonary disease. Comminuted fracture of the left humerus. Surgical changes of the right humerus. Electronically Signed   By: Corrie Mckusick D.O.   On: 06/29/2018 15:12   Dg Chest Port 1 View  Result Date: 06/16/2018 CLINICAL DATA:  Sepsis. EXAM: PORTABLE CHEST 1 VIEW COMPARISON:  05/18/2017 FINDINGS: Lungs are adequately inflated with mild hazy perihilar prominence suggesting mild vascular congestion. No lobar consolidation or effusion. Borderline stable cardiomegaly. Remainder of the exam is unchanged. IMPRESSION: Borderline stable cardiomegaly with suggestion of mild vascular congestion. Electronically Signed   By: Marin Olp M.D.   On: 06/16/2018 22:49   Dg Humerus Left  Result Date: 06/16/2018 CLINICAL DATA:  Left shoulder pain after fall. EXAM: LEFT HUMERUS - 2+ VIEW COMPARISON:  None. FINDINGS: Acute fracture of the proximal humerus involving the surgical neck and greater tuberosity. No dislocation. Moderate acromioclavicular  joint space narrowing with bony hypertrophy. Soft tissues are unremarkable. IMPRESSION: Acute fracture of the proximal humerus surgical neck and greater tuberosity. Electronically Signed   By: Titus Dubin M.D.   On: 06/16/2018 15:30   Dg Hip Unilat With Pelvis 2-3 Views Left  Result Date: 06/16/2018 CLINICAL DATA:  Left hip pain after fall. EXAM: DG HIP (WITH OR WITHOUT PELVIS) 2-3V LEFT COMPARISON:  Pelvis and right hip x-rays dated February 12, 2018. CT abdomen pelvis dated July 25, 2009. FINDINGS: No acute fracture or dislocation. The pubic symphysis and sacroiliac joints are intact. The hip joint spaces are relatively preserved. 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 Partially visualized cortical thickening of the right proximal femoral diaphysis with heterotopic ossification,  grossly unchanged dating back to prior CTs from 2010 and likely posttraumatic in etiology. Degenerative changes of the lower lumbar spine. IMPRESSION: 1.  No acute osseous abnormality. Electronically Signed   By: Titus Dubin M.D.   On: 06/16/2018 15:40   [name] Ekg Site Rite  Result Date: 07/02/2018 If Site Rite image not attached, placement could not be confirmed due to current cardiac rhythm.   (Echo, Carotid, EGD, Colonoscopy, ERCP)    Subjective:   Discharge Exam: Vitals:   07/03/18 0543 07/03/18 1159  BP: (!) 182/89 (!) 166/64  Pulse: 79   Resp: 16   Temp: 98.8 F (37.1 C)   SpO2: 98%    Vitals:   07/02/18 0913 07/02/18 2106 07/03/18 0543 07/03/18 1159  BP: (!) 154/72 139/77 (!) 182/89 (!) 166/64  Pulse:  78 79   Resp:  13 16   Temp:  99.1 F (37.3 C) 98.8 F (37.1 C)   TempSrc:  Oral Oral   SpO2:  98% 98%     General: Pt is alert, awake, not in acute distress Cardiovascular: RRR, S1/S2 +, no rubs, no gallops Respiratory: CTA bilaterally, no wheezing, no rhonchi Abdominal: Soft, NT, ND, bowel sounds + Extremities: no edema, no  cyanosis   The results of significant diagnostics from this hospitalization (including imaging, microbiology, ancillary and laboratory) are listed below for reference.     Microbiology: Recent Results (from the past 240 hour(s))  Urine culture     Status: Abnormal   Collection Time: 06/25/18  6:07 PM  Result Value Ref Range Status   Specimen Description   Final    URINE, CLEAN CATCH Performed at [facility], [place] [address], [place], [place] [address]    Special Requests   Final    NONE Performed at [facility], [place] [address] [place], [place] [address]    Culture 20,000 COLONIES/mL PROTEUS MIRABILIS (A)  Final   Report Status 06/28/2018 FINAL  Final   Organism ID, Bacteria PROTEUS MIRABILIS (A)  Final      Susceptibility   Proteus mirabilis - MIC*    AMPICILLIN <=2 SENSITIVE Sensitive     CEFAZOLIN <=4 SENSITIVE Sensitive     CEFTRIAXONE <=1 SENSITIVE Sensitive     CIPROFLOXACIN <=0.25 SENSITIVE Sensitive     GENTAMICIN <=1 SENSITIVE Sensitive     IMIPENEM 1 SENSITIVE Sensitive     NITROFURANTOIN 128 RESISTANT Resistant     TRIMETH/SULFA <=20 SENSITIVE Sensitive     AMPICILLIN/SULBACTAM <=2 SENSITIVE Sensitive     PIP/TAZO <=4 SENSITIVE Sensitive     * 20,000 COLONIES/mL PROTEUS MIRABILIS  Blood culture (routine x 2)     Status: None (Preliminary result)   Collection Time: 06/30/18  1:25 AM  Result Value Ref Range Status   Specimen Description BLOOD RIGHT HAND  Final   Special Requests   Final    BOTTLES DRAWN AEROBIC ONLY Blood Culture adequate volume   Culture   Final    NO GROWTH 3 DAYS Performed at [facility], [address] [place], [place] [address]    Report Status PENDING  Incomplete  Blood culture (routine x 2)     Status: None (Preliminary result)   Collection Time: 06/30/18  1:28 AM  Result Value Ref Range Status   Specimen Description BLOOD RIGHT HAND  Final   Special Requests   Final    BOTTLES DRAWN  AEROBIC ONLY Blood Culture adequate volume   Culture   Final  NO GROWTH 3 DAYS Performed at [facility], [place] [address], [place], [place] [address]    Report Status PENDING  Incomplete  MRSA PCR Screening     Status: None   Collection Time: 06/30/18  1:43 AM  Result Value Ref Range Status   MRSA by PCR NEGATIVE NEGATIVE Final    Comment:        The GeneXpert MRSA Assay (FDA approved for NASAL specimens only), is one component of a comprehensive MRSA colonization surveillance program. It is not intended to diagnose MRSA infection nor to guide or monitor treatment for MRSA infections. Performed at [facility], [place] [address] [place], [place] [address]      Labs: BNP (last 3 results) No results for input(s): BNP in the last 8760 hours. Basic Metabolic Panel: Recent Labs  Lab 06/29/18 1455 07/01/18 0208 07/02/18 0134  NA 140 135 137  K 4.1 4.0 4.1  CL 106 105 106  CO2 '22 23 23  '$ GLUCOSE 105* 110* 96  BUN '10 17 11  '$ CREATININE 0.88 1.50* 0.89  CALCIUM 8.8* 8.3* 8.3*   Liver Function Tests: Recent Labs  Lab 06/29/18 1455  AST 36  ALT 26  ALKPHOS 91  BILITOT 1.0  PROT 7.4  ALBUMIN 2.9*   No results for input(s): LIPASE, AMYLASE in the last 168 hours. No results for input(s): AMMONIA in the last 168 hours. CBC: Recent Labs  Lab 06/29/18 1255 07/01/18 0208 07/02/18 0134  WBC 11.6* 8.4 7.3  HGB 11.6* 8.9* 9.3*  HCT 41.8 29.6* 32.1*  MCV 86.2 85.5 86.5  PLT 436* 246 283   Cardiac Enzymes: No results for input(s): CKTOTAL, CKMB, CKMBINDEX, TROPONINI in the last 168 hours. BNP: Invalid input(s): POCBNP CBG: Recent Labs  Lab 06/30/18 1124 06/30/18 2123 07/01/18 0630  GLUCAP 125* 92 90   D-Dimer No results for input(s): DDIMER in the last 72 hours. Hgb A1c No results for input(s): HGBA1C in the last 72 hours. Lipid Profile No results for input(s): CHOL, HDL, LDLCALC, TRIG, CHOLHDL, LDLDIRECT in the last 72 hours. Thyroid function  studies No results for input(s): TSH, T4TOTAL, T3FREE, THYROIDAB in the last 72 hours.  Invalid input(s): FREET3 Anemia work up No results for input(s): VITAMINB12, FOLATE, FERRITIN, TIBC, IRON, RETICCTPCT in the last 72 hours. Urinalysis    Component Value Date/Time   COLORURINE AMBER (A) 06/29/2018 1325   APPEARANCEUR CLEAR 06/29/2018 1325   LABSPEC 1.024 06/29/2018 1325   PHURINE 6.0 06/29/2018 1325   GLUCOSEU NEGATIVE 06/29/2018 1325   HGBUR SMALL (A) 06/29/2018 1325   BILIRUBINUR NEGATIVE 06/29/2018 1325   KETONESUR 20 (A) 06/29/2018 1325   PROTEINUR NEGATIVE 06/29/2018 1325   UROBILINOGEN 0.2 03/18/2015 1001   NITRITE NEGATIVE 06/29/2018 1325   LEUKOCYTESUR NEGATIVE 06/29/2018 1325   Sepsis Labs Invalid input(s): PROCALCITONIN,  WBC,  LACTICIDVEN Microbiology Recent Results (from the past 240 hour(s))  Urine culture     Status: Abnormal   Collection Time: 06/25/18  6:07 PM  Result Value Ref Range Status   Specimen Description   Final    URINE, CLEAN CATCH Performed at [facility], [place] [address] [place], [place] [address]    Special Requests   Final    NONE Performed at [facility], [place] [address] [place], [place] [address]    Culture 20,000 COLONIES/mL PROTEUS MIRABILIS (A)  Final   Report Status 06/28/2018 FINAL  Final   Organism ID, Bacteria PROTEUS MIRABILIS (A)  Final      Susceptibility   Proteus mirabilis - MIC*    AMPICILLIN <=2 SENSITIVE Sensitive     CEFAZOLIN <=4 SENSITIVE Sensitive     CEFTRIAXONE <=1 SENSITIVE Sensitive     CIPROFLOXACIN <=0.25 SENSITIVE Sensitive     GENTAMICIN <=1 SENSITIVE Sensitive     IMIPENEM 1 SENSITIVE Sensitive     NITROFURANTOIN 128 RESISTANT Resistant     TRIMETH/SULFA <=20 SENSITIVE Sensitive     AMPICILLIN/SULBACTAM <=2 SENSITIVE Sensitive     PIP/TAZO <=4 SENSITIVE Sensitive     * 20,000 COLONIES/mL PROTEUS MIRABILIS  Blood culture (routine x 2)     Status: None  (Preliminary result)   Collection Time: 06/30/18  1:25 AM  Result Value Ref Range Status   Specimen Description BLOOD RIGHT HAND  Final   Special Requests   Final    BOTTLES DRAWN AEROBIC ONLY Blood Culture adequate volume   Culture   Final    NO GROWTH 3 DAYS Performed at [facility], [place] [address], [place], [place] [address]    Report Status PENDING  Incomplete  Blood culture (routine x 2)     Status: None (Preliminary result)   Collection Time: 06/30/18  1:28 AM  Result Value Ref Range Status   Specimen Description BLOOD RIGHT HAND  Final   Special Requests   Final    BOTTLES DRAWN AEROBIC ONLY Blood Culture adequate volume   Culture   Final    NO GROWTH 3 DAYS Performed at [facility], [place] [address], [place], [place] [address]    Report Status PENDING  Incomplete  MRSA PCR Screening     Status: None   Collection Time: 06/30/18  1:43 AM  Result Value Ref Range Status   MRSA by PCR NEGATIVE NEGATIVE Final    Comment:        The GeneXpert MRSA Assay (FDA approved for NASAL specimens only), is one component of a comprehensive MRSA colonization surveillance program. It is not intended to diagnose MRSA infection nor to guide or monitor treatment for MRSA infections. Performed at [facility], [place] [address], [place], [place] [address]      Time coordinating discharge: 35 minutes  SIGNED:   Antonieta Pert, MD  [facility] 07/03/2018, 1:57 PM Pager [phone]  If 7PM-7AM, please contact night-coverage [email or web address] Password [email or web address]  Please Note: This patient record was dictated using Editor, commissioning. Chart creation errors have been sought, but may not always have been located. Such creation errors do not reflect on the Standard of Medical Care.

## 2018-07-04 ENCOUNTER — Inpatient Hospital Stay (HOSPITAL_COMMUNITY): Payer: Self-pay | Admitting: Physical Therapy

## 2018-07-04 ENCOUNTER — Other Ambulatory Visit: Payer: Self-pay

## 2018-07-04 ENCOUNTER — Inpatient Hospital Stay (HOSPITAL_COMMUNITY): Payer: Self-pay | Admitting: Occupational Therapy

## 2018-07-04 DIAGNOSIS — I10 Essential (primary) hypertension: Secondary | ICD-10-CM

## 2018-07-04 DIAGNOSIS — D62 Acute posthemorrhagic anemia: Secondary | ICD-10-CM

## 2018-07-04 DIAGNOSIS — G062 Extradural and subdural abscess, unspecified: Secondary | ICD-10-CM

## 2018-07-04 DIAGNOSIS — M792 Neuralgia and neuritis, unspecified: Secondary | ICD-10-CM

## 2018-07-04 DIAGNOSIS — M4306 Spondylolysis, lumbar region: Secondary | ICD-10-CM

## 2018-07-04 MED ORDER — DIPHENHYDRAMINE-ZINC ACETATE 2-0.1 % EX CREA
TOPICAL_CREAM | Freq: Two times a day (BID) | CUTANEOUS | Status: DC | PRN
  Administered 2018-07-04 (×2): via TOPICAL
  Filled 2018-07-04: qty 28

## 2018-07-04 MED ORDER — CHLORHEXIDINE GLUCONATE CLOTH 2 % EX PADS
6.0000 | MEDICATED_PAD | Freq: Two times a day (BID) | CUTANEOUS | Status: DC
  Administered 2018-07-04 – 2018-07-14 (×20): 6 via TOPICAL
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
## 2018-07-04 NOTE — Progress Notes (Signed)
[facility] PHYSICAL MEDICINE & REHABILITATION PROGRESS NOTE  Subjective/Complaints: Patient seen laying in bed this morning.  She states she slept better than she expected to sleep overnight.  She is ready to begin therapies.  ROS: Denies CP, shortness of breath, nausea, vomiting, diarrhea.  Objective: Vital Signs: Blood pressure 130/71, pulse 74, temperature 98.7 F (37.1 C), temperature source Oral, resp. rate 16, height 5\' 3"  (1.6 m), weight 125.5 kg, SpO2 99 %. [name] Ekg Site Rite  Result Date: 07/02/2018 If Site Rite image not attached, placement could not be confirmed due to current cardiac rhythm.  Recent Labs    07/02/18 0134  WBC 7.3  HGB 9.3*  HCT 32.1*  PLT 283   Recent Labs    07/02/18 0134  NA 137  K 4.1  CL 106  CO2 23  GLUCOSE 96  BUN 11  CREATININE 0.89  CALCIUM 8.3*    Physical Exam: BP 130/71 (BP Location: Left Arm)   Pulse 74   Temp 98.7 F (37.1 C) (Oral)   Resp 16   Ht 5\' 3"  (1.6 m)   Wt 125.5 kg   SpO2 99%   BMI 49.01 kg/m  Constitutional: NAD.  Well-developed.  Obese. HENT: Normocephalicand atraumatic.  Eyes:EOMI.  No discharge. Cardiovascular:Normal rateand normal heart sounds.  No JVD. Respiratory:Effort normaland breath sounds normal.  GI: She exhibitsno distension.  Bowel sounds normal. Musculoskeletal: Left upper extremity with shoulder sling. Left arm tender to touch.  Neurological: She isalertand oriented. Patient is alert and oriented.  Follows full commands.  Motor: LUE is limited by pain.  RUE 5/5 proximal to distal.  Left lower extremity: 4-4+/5 proximal distal Right lower extremity: 4-/5 proximal to distal Skin: Skin iswarmand dry.  Psychiatric:Pleasant and appropriate.  Assessment/Plan: 1. Functional deficits secondary to multi-Ortho with epidural abscess which require 3+ hours per day of interdisciplinary therapy in a comprehensive inpatient rehab setting.  Physiatrist is providing close team  supervision and 24 hour management of active medical problems listed below.  Physiatrist and rehab team continue to assess barriers to discharge/monitor patient progress toward functional and medical goals  Care Tool:  Bathing              Bathing assist       Upper Body Dressing/Undressing Upper body dressing        Upper body assist      Lower Body Dressing/Undressing Lower body dressing            Lower body assist       Toileting Toileting    Toileting assist Assist for toileting: 2 Helpers     Transfers Chair/bed transfer  Transfers assist           Locomotion Ambulation   Ambulation assist              Walk 10 feet activity   Assist           Walk 50 feet activity   Assist           Walk 150 feet activity   Assist           Walk 10 feet on uneven surface  activity   Assist           Wheelchair     Assist               Wheelchair 50 feet with 2 turns activity    Assist  Wheelchair 150 feet activity     Assist            Medical Problem List and Plan: 1.Decreased functional mobilitysecondary to history of falls with left humerus fracture, T10 fracture 06/16/2018-TLSO and a right L5 pars defect/epidural abscess related to recent fall.   Begin CIR  Notes reviewed- multiple falls with fractures, images reviewed-MRI lumbar spine with multilevel disc and facet disease with stenosis, labs reviewed 2. DVT Prophylaxis/Anticoagulation: Subcutaneous Lovenox.   Vascular study pending 3. Pain Management:Neurontin changed to scheduled dosing, Robaxin 500 mg twice a day 4. Mood:Celexa20 mg daily 5. Neuropsych: This patientiscapable of making decisions on herown behalf. 6. Skin/Wound Care:Routine skin checks 7. Fluids/Electrolytes/Nutrition:Routine in and out's 8.Recurrent epidural abscess. Continue Rocephin.   Will discuss with infectious disease on duration  of antibiotic 9. Hypertension. Labetalol 200 mg twice a day  Monitor with increased mobility, erroneously elevated readings in lower extremities 10.Chronic diastolic congestive heart failure. Monitor for signs of fluid overload Filed Weights   07/03/18 1718  Weight: 125.5 kg  11.?  CKD stage III.   Creatinine 0.89 on 11/14  Repeat labs ordered for Monday 12. Morbid obesity. Dietary follow-up 13.  Acute blood loss anemia  Hemoglobin 9.3 on 11/14  Labs ordered for Monday   LOS: 1 days A FACE TO FACE EVALUATION WAS PERFORMED  Jaskiran Pata [name] 07/04/2018, 6:56 AM

## 2018-07-04 NOTE — IPOC Note (Signed)
Overall Plan of Care [facility]) Patient Details Name: [name] MRN: [id number] DOB: 06-26-1951  Admitting Diagnosis: Epidural abscess with left shoulder injury  Hospital Problems: Active Problems:   Epidural abscess   Lumbar pars defect   Acute blood loss anemia   Neuropathic pain   Stage 3 chronic kidney disease (HCC)   Benign essential HTN     Functional Problem List: Nursing Bladder, Pain, Endurance, Medication Management, Safety  PT Balance, Endurance, Pain  OT Balance, Skin Integrity, Edema, Endurance, Motor, Pain, Safety, Sensory  SLP    TR         Basic ADL's: OT Grooming, Bathing, Dressing, Toileting     Advanced  ADL's: OT       Transfers: PT Bed Mobility, Bed to Chair, Car, [employer]  OT Toilet     Locomotion: PT Stairs, Ambulation     Additional Impairments: OT Fuctional Use of Upper Extremity  SLP        TR      Anticipated Outcomes Item Anticipated Outcome  Self Feeding No goal  Swallowing      Basic self-care  Mod A  Toileting  Max A   Bathroom Transfers Supervision/cuing   Bowel/Bladder   manage bladder with min assist  Transfers  S transfers  Locomotion  S gait, min A stairs  Communication     Cognition     Pain  Pain at or below level  5  Safety/Judgment  MAintain safety with cues/reminders   Therapy Plan: PT Intensity: Minimum of 1-2 x/day ,45 to 90 minutes PT Frequency: 5 out of 7 days PT Duration Estimated Length of Stay: 7 to 10 days OT Intensity: Minimum of 1-2 x/day, 45 to 90 minutes OT Frequency: 5 out of 7 days OT Duration/Estimated Length of Stay: 8-10 days      Team Interventions: Nursing Interventions Patient/Family Education, Disease Management/Prevention, Bladder Management, Medication Management  PT interventions Ambulation/gait training, Balance/vestibular training, Discharge planning, DME/adaptive equipment instruction, Neuromuscular re-education, Functional mobility training, Pain management,  Patient/family education, Stair training, Splinting/orthotics, Therapeutic Activities, Therapeutic Exercise, UE/LE Coordination activities, UE/LE Strength taining/ROM, Wheelchair propulsion/positioning  OT Interventions Balance/vestibular training, Disease mangement/prevention, Self Care/advanced ADL retraining, Therapeutic Exercise, Wheelchair propulsion/positioning, DME/adaptive equipment instruction, Pain management, Skin care/wound managment, UE/LE Strength taining/ROM, UE/LE Coordination activities, Splinting/orthotics, Patient/family education, Community reintegration, Discharge planning, Functional mobility training, Psychosocial support, Therapeutic Activities  SLP Interventions    TR Interventions    SW/CM Interventions     Barriers to Discharge MD  Medical stability, Wound care, Weight and Weight bearing restrictions  Nursing      PT Decreased caregiver support limited by severe pain  OT Medical stability, Lack of/limited family support    SLP      SW       Team Discharge Planning: Destination: PT-Home ,OT- Home , SLP-  Projected Follow-up: PT-Home health PT, 24 hour supervision/assistance, OT-  Home health OT, SLP-  Projected Equipment Needs: PT-None recommended by PT, OT- To be determined, SLP-  Equipment Details: PT- , OT-  Patient/family involved in discharge planning: PT- Patient,  OT-Patient, SLP-   MD ELOS: 8-13 days. Medical Rehab Prognosis:  Good Assessment: 67 year old right-handed female with history of morbid obesity, CKD stage III,diastolic congestive heart failure and chronic low back pain, prior osteomyelitis/discitis/spinal epidural abscess of the thoracic and lumbar region and did remain on prolonged course of antibiotic therapy. Patient with recent admission 06/16/2018 to 06/23/2018 after a fall sustaining left proximal humerus neck and greater tuberosity fracture  as well as acute minimally displaced fracture T10 vertebral body. She was placed in a TLSO back  brace per neurosurgery. Nonweightbearing left upper extremity 6 weeks per orthopedic services Dr. Griffin Basil and no surgical intervention. Presented 06/30/2018 with increasing back pain. MRI lumbar spine reviewed per neurosurgery showing acute fracture right L5 pars intra-articularis defect and a new grade 1 L5-S1 anterior listhesis. 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 Patient with resulting deficits with mobility, transfers, endurance, self-care.  Will set goals for Supervision/Min A with PT and Mod A/Max A with OT.  See Team Conference Notes for weekly updates to the plan of care
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
## 2018-07-04 NOTE — Progress Notes (Signed)
Physical Therapy Session Note  Patient Details  Name: [name] MRN: [id number] Date of Birth: 1950-09-22  Today's Date: 07/04/2018 PT Individual Time: 1420-1500 PT Individual Time Calculation (min): 40 min   Short Term Goals: Week 1:  PT Short Term Goal 1 (Week 1): =LTGs due to ELOS  Skilled Therapeutic Interventions/Progress Updates:   Pt received supine in bed and agreeable to PT.  At bed level.   Rolling R  with Mod assist to control the RUE. Pt states that LUE is too painful to perform rolling to the L. Supine therex: SAQ, hip abduction, heel slides, ankle PF/DF with manual resistance, hip adduction, AAROM SLR, all completed x 12 BLE with cues for ROM and speed. Pt left in bed with all needs met.      Therapy Documentation Precautions:  Precautions Precautions: Fall, Back Precaution Comments: pt recalls 3/3 precautions  Required Braces or Orthoses: Spinal Brace, Sling Spinal Brace: Thoracolumbosacral orthotic, Applied in sitting position Restrictions Weight Bearing Restrictions: Yes LUE Weight Bearing: Non weight bearing Vital Signs: Therapy Vitals Temp: 98.8 F (37.1 C) Temp Source: Oral Pulse Rate: 74 BP: 131/69 Patient Position (if appropriate): Lying Oxygen Therapy SpO2: 100 % O2 Device: Room Air Pain: Pain Assessment Pain Scale: 0-10 Pain Score: 7  Pain Type: Acute pain Pain Location: Back Pain Orientation: Lower Pain Descriptors / Indicators: Aching Pain Frequency: Intermittent Pain Onset: On-going Patients Stated Pain Goal: 0 Pain Intervention(s): Medication (See eMAR)   Therapy/Group: Individual Therapy  [name] 07/04/2018, 4:12 PM

## 2018-07-04 NOTE — Progress Notes (Signed)
Physical Therapy Session Note  Patient Details  Name: [name] MRN: [id number] Date of Birth: Feb 03, 1951  Today's Date: 07/04/2018 PT Individual Time: 4098-1191 PT Individual Time Calculation (min): 15 min   Short Term Goals: Week 1:  PT Short Term Goal 1 (Week 1): =LTGs due to ELOS  Skilled Therapeutic Interventions/Progress Updates:  Pt was seen bedside in the pm. Pt moved up in bed with bed controls and mod A with verbal cues. Pt c/o 7/10 back pain, pt's nurse at bedside to give pain medicine. Pt c/o fatigue and pain, tx session ended, pt position in bed for comfort with pillows. Call bell and phone within reach.   Therapy Documentation Precautions:  Precautions Precautions: Fall, Back Precaution Comments: pt recalls 3/3 precautions  Required Braces or Orthoses: Spinal Brace, Sling Spinal Brace: Thoracolumbosacral orthotic, Applied in sitting position Restrictions Weight Bearing Restrictions: Yes LUE Weight Bearing: Non weight bearing General: PT Amount of Missed Time (min): 15 Minutes PT Missed Treatment Reason: Patient fatigue;Pain Pain: Pt c/o 7/10 back pain, medicated by nursing.   Therapy/Group: Individual Therapy  [name] 07/04/2018, 3:20 PM

## 2018-07-05 ENCOUNTER — Inpatient Hospital Stay (HOSPITAL_COMMUNITY): Payer: Self-pay | Admitting: Physical Therapy

## 2018-07-05 ENCOUNTER — Inpatient Hospital Stay (HOSPITAL_COMMUNITY): Payer: Medicare Other

## 2018-07-05 DIAGNOSIS — N183 Chronic kidney disease, stage 3 unspecified: Secondary | ICD-10-CM

## 2018-07-05 DIAGNOSIS — M7989 Other specified soft tissue disorders: Secondary | ICD-10-CM

## 2018-07-05 DIAGNOSIS — I10 Essential (primary) hypertension: Secondary | ICD-10-CM

## 2018-07-05 NOTE — Progress Notes (Signed)
[place] PHYSICAL MEDICINE & REHABILITATION PROGRESS NOTE  Subjective/Complaints: Patient seen laying in bed this AM.  She slept well overnight.  She states she had a good first day of therapies yesterday.  She notes some muscle spasms overnight.    ROS: +LUE muscle spasms. Denies CP, shortness of breath, nausea, vomiting, diarrhea.  Objective: Vital Signs: Blood pressure (!) 157/73, pulse 74, temperature 98.2 F (36.8 C), resp. rate 20, height 5\' 3"  (1.6 m), weight 125.5 kg, SpO2 97 %. No results found. No results for input(s): WBC, HGB, HCT, PLT in the last 72 hours. No results for input(s): NA, K, CL, CO2, GLUCOSE, BUN, CREATININE, CALCIUM in the last 72 hours.  Physical Exam: BP (!) 157/73 (BP Location: Left Leg)   Pulse 74   Temp 98.2 F (36.8 C)   Resp 20   Ht 5\' 3"  (1.6 m)   Wt 125.5 kg   SpO2 97%   BMI 49.01 kg/m  Constitutional: NAD.  Well-developed.  Obese. HENT: Normocephalicand atraumatic.  Eyes:EOMI.  No discharge. Cardiovascular:RRR. No JVD. Respiratory:Effort normal and breath sounds normal.  GI: She exhibitsno distension.  Bowel sounds normal. Musculoskeletal: Left upper extremity with shoulder sling. Left arm tender to touch.  Neurological: She isalertand oriented. Patient is alert and oriented.  Follows full commands.  Motor: LUE is limited by pain and sling.  RUE 5/5 proximal to distal.  Left lower extremity: 4-4+/5 proximal distal, stable Right lower extremity: 4-/5 proximal to distal, stable Skin: Skin iswarmand dry.  Psychiatric:Pleasant and appropriate.  Assessment/Plan: 1. Functional deficits secondary to multi-Ortho with epidural abscess which require 3+ hours per day of interdisciplinary therapy in a comprehensive inpatient rehab setting.  Physiatrist is providing close team supervision and 24 hour management of active medical problems listed below.  Physiatrist and rehab team continue to assess barriers to discharge/monitor  patient progress toward functional and medical goals  Care Tool:  Bathing    Body parts bathed by patient: Chest, Front perineal area, Right upper leg, Left upper leg, Face   Body parts bathed by helper: Front perineal area, Buttocks     Bathing assist Assist Level: Moderate Assistance - Patient 50 - 74%     Upper Body Dressing/Undressing Upper body dressing   What is the patient wearing?: Hospital gown only    Upper body assist Assist Level: Maximal Assistance - Patient 25 - 49%    Lower Body Dressing/Undressing Lower body dressing      What is the patient wearing?: (she refused to don skirt)     Lower body assist       Toileting Toileting    Toileting assist Assist for toileting: Moderate Assistance - Patient 50 - 74%     Transfers Chair/bed transfer  Transfers assist     Chair/bed transfer assist level: Moderate Assistance - Patient 50 - 74%     Locomotion Ambulation   Ambulation assist      Assist level: Minimal Assistance - Patient > 75% Assistive device: Hand held assist Max distance: 8   Walk 10 feet activity   Assist     Assist level: Minimal Assistance - Patient > 75% Assistive device: Hand held assist   Walk 50 feet activity   Assist Walk 50 feet with 2 turns activity did not occur: Safety/medical concerns         Walk 150 feet activity   Assist Walk 150 feet activity did not occur: Safety/medical concerns         Walk 10  feet on uneven surface  activity   Assist Walk 10 feet on uneven surfaces activity did not occur: Safety/medical concerns         Wheelchair     Assist               Wheelchair 50 feet with 2 turns activity    Assist            Wheelchair 150 feet activity     Assist            Medical Problem List and Plan: 1.Decreased functional mobilitysecondary to history of falls with left humerus fracture, T10 fracture 06/16/2018-TLSO and a right L5 pars  defect/epidural abscess related to recent fall.   Cont CIR 2. DVT Prophylaxis/Anticoagulation: Subcutaneous Lovenox.   Vascular study remains pending 3. Pain Management:Neurontin changed to scheduled dosing, Robaxin 500 mg twice a day 4. Mood:Celexa20 mg daily 5. Neuropsych: This patientiscapable of making decisions on herown behalf. 6. Skin/Wound Care:Routine skin checks 7. Fluids/Electrolytes/Nutrition:Routine in and out's 8.Recurrent epidural abscess. Continue Rocephin.   Will discuss with infectious disease on duration of antibiotic 9. Hypertension. Labetalol 200 mg twice a day  Elevated this AM, discussed with nursing regarding consistent recordings in arm.   17.[id number] diastolic congestive heart failure. Monitor for signs of fluid overload Filed Weights   07/03/18 1718  Weight: 125.5 kg  11.?  CKD stage III.   Creatinine 0.89 on 11/14  Repeat labs ordered for tomorrow 12. Morbid obesity. Dietary follow-up 13.  Acute blood loss anemia  Hemoglobin 9.3 on 11/14  Labs ordered for tomorrow  LOS: 2 days A FACE TO FACE EVALUATION WAS PERFORMED  [name] [name] 07/05/2018, 2:14 PM

## 2018-07-05 NOTE — Progress Notes (Signed)
VASCULAR LAB PRELIMINARY  PRELIMINARY  PRELIMINARY  PRELIMINARY  Bilateral lower extremity venous duplex completed.    Preliminary report:  There is no obvious evidence DVT or SVT noted in the bilateral lower extremities.   Joleene Burnham, RVT 07/05/2018, 3:46 PM

## 2018-07-05 NOTE — Progress Notes (Signed)
Physical Therapy Session Note  Patient Details  Name: [name] MRN: [id number] Date of Birth: 11/02/1950  Today's Date: 07/05/2018 PT Individual Time: 1300-1345 PT Individual Time Calculation (min): 45 min   Short Term Goals: Week 1:  PT Short Term Goal 1 (Week 1): =LTGs due to ELOS  Skilled Therapeutic Interventions/Progress Updates:  Pt was seen bedside in the pm. Pt transferred supine to edge of bed with mod A and verbal cues. Donned TLSO. Pt performed multiple sit to stand and stand pivot transfers with min to mod A with verbal cues, level of assistance fluctuated with surface height and level of fatigue. Pt performed car transfers with min to mod A and verbal cues. Pt performed toilet transfers with min to mod A and verbal cues. Pt sitting on bedside commode with nurse tech at bedside.   Therapy Documentation Precautions:  Precautions Precautions: Fall, Back Precaution Comments: pt recalls 3/3 precautions  Required Braces or Orthoses: Spinal Brace, Sling Spinal Brace: Thoracolumbosacral orthotic, Applied in sitting position Restrictions Weight Bearing Restrictions: Yes LUE Weight Bearing: Non weight bearing General: PT Amount of Missed Time (min): 15 Minutes PT Missed Treatment Reason: Pain;Toileting;Patient fatigue Vital Signs:  Pain: Pt c/o mod pain R shoulder    Therapy/Group: Individual Therapy  [name] 07/05/2018, 1:49 PM

## 2018-07-06 ENCOUNTER — Inpatient Hospital Stay (HOSPITAL_COMMUNITY): Payer: Self-pay

## 2018-07-06 ENCOUNTER — Inpatient Hospital Stay (HOSPITAL_COMMUNITY): Payer: Self-pay | Admitting: Occupational Therapy

## 2018-07-06 ENCOUNTER — Inpatient Hospital Stay (HOSPITAL_COMMUNITY): Payer: Self-pay | Admitting: Physical Therapy

## 2018-07-06 DIAGNOSIS — G8918 Other acute postprocedural pain: Secondary | ICD-10-CM

## 2018-07-06 LAB — COMPREHENSIVE METABOLIC PANEL
ALBUMIN: 2.6 g/dL — AB (ref 3.5–5.0)
ALK PHOS: 104 U/L (ref 38–126)
ALT: 13 U/L (ref 0–44)
AST: 16 U/L (ref 15–41)
Anion gap: 8 (ref 5–15)
BUN: 6 mg/dL — ABNORMAL LOW (ref 8–23)
CHLORIDE: 102 mmol/L (ref 98–111)
CO2: 26 mmol/L (ref 22–32)
CREATININE: 0.95 mg/dL (ref 0.44–1.00)
Calcium: 8.4 mg/dL — ABNORMAL LOW (ref 8.9–10.3)
GFR calc Af Amer: >60 mL/min (ref >60)
GFR calc non Af Amer: 60 mL/min — ABNORMAL LOW (ref >60)
GLUCOSE: 100 mg/dL — AB (ref 70–99)
Potassium: 3.9 mmol/L (ref 3.5–5.1)
SODIUM: 136 mmol/L (ref 135–145)
Total Bilirubin: 0.3 mg/dL (ref 0.3–1.2)
Total Protein: 6.5 g/dL (ref 6.5–8.1)

## 2018-07-06 LAB — CBC WITH DIFFERENTIAL/PLATELET
ABS IMMATURE GRANULOCYTES: 0.04 10*3/uL (ref 0.00–0.07)
BASOS ABS: 0 10*3/uL (ref 0.0–0.1)
BASOS PCT: 0 %
Eosinophils Absolute: 0.2 10*3/uL (ref 0.0–0.5)
Eosinophils Relative: 3 %
HCT: 32.2 % — ABNORMAL LOW (ref 36.0–46.0)
HEMOGLOBIN: 9.3 g/dL — AB (ref 12.0–15.0)
IMMATURE GRANULOCYTES: 1 %
LYMPHS PCT: 30 %
Lymphs Abs: 1.8 10*3/uL (ref 0.7–4.0)
MCH: 25 pg — ABNORMAL LOW (ref 26.0–34.0)
MCHC: 28.9 g/dL — ABNORMAL LOW (ref 30.0–36.0)
MCV: 86.6 fL (ref 80.0–100.0)
Monocytes Absolute: 2 10*3/uL — ABNORMAL HIGH (ref 0.1–1.0)
Monocytes Relative: 35 %
NEUTROS ABS: 1.8 10*3/uL (ref 1.7–7.7)
NEUTROS PCT: 31 %
NRBC: 0.3 % — AB (ref 0.0–0.2)
PLATELETS: 230 10*3/uL (ref 150–400)
RBC: 3.72 MIL/uL — AB (ref 3.87–5.11)
RDW: 19.9 % — ABNORMAL HIGH (ref 11.5–15.5)
WBC: 5.8 10*3/uL (ref 4.0–10.5)

## 2018-07-06 MED ORDER — LIDOCAINE 5 % EX PTCH
1.0000 | MEDICATED_PATCH | CUTANEOUS | Status: DC
  Administered 2018-07-06 – 2018-07-15 (×10): 1 via TRANSDERMAL
  Filled 2018-07-06 (×10): qty 1

## 2018-07-06 MED ORDER — METHOCARBAMOL 750 MG PO TABS
750.0000 mg | ORAL_TABLET | Freq: Three times a day (TID) | ORAL | Status: DC
  Administered 2018-07-06 – 2018-07-15 (×28): 750 mg via ORAL
  Filled 2018-07-06 (×28): qty 1

## 2018-07-06 NOTE — Progress Notes (Signed)
Physical Therapy Session Note  Patient Details  Name: [name] MRN: [id number] Date of Birth: 1951/06/14  Today's Date: 07/06/2018 PT Individual Time: 1139-1203 PT Individual Time Calculation (min): 24 min  Short Term Goals: Week 1:  PT Short Term Goal 1 (Week 1): =LTGs due to ELOS  Skilled Therapeutic Interventions/Progress Updates:  Pt received in bed, eating lunch but agreeable to tx. Pt seen to make up missed time from previous date. Pt declining getting out of bed despite encouragement and reports she completed BLE exercises in previous PT session. Provided pt with BLE HEP handout for visual reference when wanting to complete exercises independently in bed. Pt reports she has completed all exercises except short arc quads & glute squeezes with therapist reviewing these & pt performing these with instructional cuing for technique. Pt agreeable to RUE exercises and pt provided with green theraband and therapist provided instructional cuing while pt performed RUE bicep curls and tricep extensions with theraband attached to bed. At end of session pt left in bed set up with meal tray & all needs in reach.  Pain: pt reports 9/10 pain in L shoulder & RN made aware.  Therapy Documentation Precautions:  Precautions Precautions: Fall, Back Precaution Comments: pt recalls 3/3 precautions  Required Braces or Orthoses: Spinal Brace, Sling Spinal Brace: Thoracolumbosacral orthotic, Applied in sitting position Restrictions Weight Bearing Restrictions: Yes LUE Weight Bearing: Non weight bearing    Therapy/Group: Individual Therapy  [name] 07/06/2018, 12:10 PM

## 2018-07-06 NOTE — Progress Notes (Signed)
[place] PHYSICAL MEDICINE & REHABILITATION PROGRESS NOTE  Subjective/Complaints: Patient seen laying in bed this AM.  She states she did not sleep well overnight due to left shoulder pain.   ROS: +Left shoulder pain. Denies CP, shortness of breath, nausea, vomiting, diarrhea.  Objective: Vital Signs: Blood pressure 119/71, pulse 79, temperature 97.9 F (36.6 C), resp. rate 19, height 5\' 3"  (1.6 m), weight 125.5 kg, SpO2 97 %. No results found. Recent Labs    07/06/18 0259  WBC 5.8  HGB 9.3*  HCT 32.2*  PLT 230   Recent Labs    07/06/18 0259  NA 136  K 3.9  CL 102  CO2 26  GLUCOSE 100*  BUN 6*  CREATININE 0.95  CALCIUM 8.4*    Physical Exam: BP 119/71 (BP Location: Left Arm)   Pulse 79   Temp 97.9 F (36.6 C)   Resp 19   Ht 5\' 3"  (1.6 m)   Wt 125.5 kg   SpO2 97%   BMI 49.01 kg/m  Constitutional: NAD.  Well-developed.  Obese. HENT: Normocephalicand atraumatic.  Eyes:EOMI.  No discharge. Cardiovascular:RRR. No JVD. Respiratory:Effort normal and breath sounds normal.  GI: She exhibitsno distension.  Bowel sounds normal. Musculoskeletal: Left upper extremity with shoulder sling. Left arm tender to touch.  Neurological: She isalertand oriented. Patient is alert and oriented.  Follows full commands.  Motor: LUE is limited by pain and sling.  RUE 5/5 proximal to distal.  Left lower extremity: 4-4+/5 proximal distal, unchanged Right lower extremity: 4-/5 proximal to distal, unchanged Skin: Skin iswarmand dry.  Psychiatric:Pleasant and appropriate.  Assessment/Plan: 1. Functional deficits secondary to multi-Ortho with epidural abscess which require 3+ hours per day of interdisciplinary therapy in a comprehensive inpatient rehab setting.  Physiatrist is providing close team supervision and 24 hour management of active medical problems listed below.  Physiatrist and rehab team continue to assess barriers to discharge/monitor patient progress  toward functional and medical goals  Care Tool:  Bathing    Body parts bathed by patient: Chest, Front perineal area, Right upper leg, Left upper leg, Face   Body parts bathed by helper: Front perineal area, Buttocks     Bathing assist Assist Level: Moderate Assistance - Patient 50 - 74%     Upper Body Dressing/Undressing Upper body dressing   What is the patient wearing?: Hospital gown only    Upper body assist Assist Level: Maximal Assistance - Patient 25 - 49%    Lower Body Dressing/Undressing Lower body dressing      What is the patient wearing?: (she refused to don skirt)     Lower body assist       Toileting Toileting    Toileting assist Assist for toileting: Moderate Assistance - Patient 50 - 74%     Transfers Chair/bed transfer  Transfers assist     Chair/bed transfer assist level: Moderate Assistance - Patient 50 - 74%     Locomotion Ambulation   Ambulation assist      Assist level: Minimal Assistance - Patient > 75% Assistive device: Hand held assist Max distance: 8   Walk 10 feet activity   Assist     Assist level: Minimal Assistance - Patient > 75% Assistive device: Hand held assist   Walk 50 feet activity   Assist Walk 50 feet with 2 turns activity did not occur: Safety/medical concerns         Walk 150 feet activity   Assist Walk 150 feet activity did not occur: Safety/medical  concerns         Walk 10 feet on uneven surface  activity   Assist Walk 10 feet on uneven surfaces activity did not occur: Safety/medical concerns         Wheelchair     Assist               Wheelchair 50 feet with 2 turns activity    Assist            Wheelchair 150 feet activity     Assist            Medical Problem List and Plan: 1.Decreased functional mobilitysecondary to history of falls with left humerus fracture, T10 fracture 06/16/2018-TLSO and a right L5 pars defect/epidural abscess  related to recent fall.   Cont CIR 2. DVT Prophylaxis/Anticoagulation: Subcutaneous Lovenox.   Vascular study negative for DVT 3. Pain Management:Neurontin changed to scheduled dosing, Robaxin 500 mg increased to 750 3 times daily  Lidoderm patch started on 11/18 4. Mood:Celexa20 mg daily 5. Neuropsych: This patientiscapable of making decisions on herown behalf. 6. Skin/Wound Care:Routine skin checks 7. Fluids/Electrolytes/Nutrition:Routine in and out's 8.Recurrent epidural abscess. Continue Rocephin.   Will discuss with infectious disease on duration of antibiotic 9. Hypertension. Labetalol 200 mg twice a day  Controlled on 11/18 10.Chronic diastolic congestive heart failure. Monitor for signs of fluid overload Filed Weights   07/03/18 1718  Weight: 125.5 kg  11.?  CKD stage III.   Creatinine 0.95 on 11/18 12. Morbid obesity. Dietary follow-up 13.  Acute blood loss anemia  Hemoglobin 9.3 on 11/18  LOS: 3 days A FACE TO FACE EVALUATION WAS PERFORMED  [name] [name] 07/06/2018, 8:46 AM

## 2018-07-06 NOTE — Progress Notes (Signed)
Patient awake the majority of shift due to discomfort of pain to left shoulder and back, continue prn medication for discomfort but pain score remains at 6-7/10. Warm compressed applied, Left sling in place, repositioned for comfort, emotional support continue, monitored Refer to assessment data.

## 2018-07-06 NOTE — Progress Notes (Signed)
Occupational Therapy Session Note  Patient Details  Name: CAMIA DIPINTO MRN: [id number] Date of Birth: 1951-08-04  Today's Date: 07/06/2018 OT Individual Time: 0800-0900 OT Individual Time Calculation (min): 60 min    Short Term Goals: Week 1:     Skilled Therapeutic Interventions/Progress Updates:    Pt presents supine in bed, completing use of bed pan with NT assist, agreeable to OT tx session. Pt just receiving pain meds prior to start of session. Pt rolling to L side for removal of bedpan and totalA peri-care at bed level. Pt transitioning supine>sitting EOB modA. Pt sat EOB for completion of UB bathing/dressing, requiring overall modA for UB bathing, maxA UB dressing and totalA for donning TLSO, requires increased time to perform tasks. Pt verbalizing increased pain sitting EOB, initially in back and verbalizes some relief with application of TLSO. As pt continued to sit EOB pt verbalizing increasing pain in RLE, attempted stretching/repositioning while sitting EOB with no relief, and with pain continuing to increase to the point pt crying out significantly in pain. NT arriving and provided +2 assist to safely return pt to supine due to pain levels. Pt positioned in bed for comfort, RN and PA both checking in at this time with RN providing additional pain meds. Pain subsiding some with rest in supine. Pt left supine in bed end of session with bed alarm set, call bell and needs within reach.  Pt missing 15 min skilled OT tx due to pain.   Therapy Documentation Precautions:  Precautions Precautions: Fall, Back Precaution Comments: pt recalls 3/3 precautions  Required Braces or Orthoses: Spinal Brace, Sling Spinal Brace: Thoracolumbosacral orthotic, Applied in sitting position Restrictions Weight Bearing Restrictions: Yes LUE Weight Bearing: Non weight bearing    Therapy/Group: Individual Therapy  Raymondo Band 07/06/2018, 9:37 AM

## 2018-07-06 NOTE — Progress Notes (Signed)
Retta Diones, RN  Rehab Admission Coordinator  Physical Medicine and Rehabilitation  PMR Pre-admission  Signed  Date of Service:  07/03/2018 2:27 PM       Related encounter: ED to Hosp-Admission (Discharged) from 06/29/2018 in [facility]         Show:Clear all [x] Manual[x] Template[x] Copied  Added by: [x] Retta Diones, RN  [] Hover for details PMR Admission Coordinator Pre-Admission Assessment  Patient: [name] is an 67 y.o., female MRN: [id number] DOB: [birth year] Height: 5'3" Weight: 122.5 kg                                                                                                                                                  Insurance Information HMO:      PPO:       PCP:       IPA:       80/20:       OTHER:   PRIMARY: Medicare part B only      Policy#: [id number]      Subscriber: patient CM Name:        Phone#:       Fax#:   Pre-Cert#:        Employer: Disabled Benefits:  Phone #:       Name: Checked in [place] one source [place]. Date: 03/19/14     Deduct:        Out of Pocket Max:        Life Max:   CIR:        SNF:   Outpatient: 80%     Co-Pay: 20% Home Health:        Co-Pay:   DME: 80%     Co-Pay: 20% Providers: patient's choice  SECONDARY: Medicaid [place] access      Policy#: [id number]      Subscriber: Patient CM Name:        Phone#:       Fax#:   Pre-Cert#:        Employer: disabled Benefits:  Phone #: [phone]     Name: Automated Eff. Date: Eligible 07/03/18 with coverage code [id number]     Deduct:        Out of Pocket Max:        Life Max:   CIR:        SNF:   Outpatient:       Co-Pay:   Home Health:        Co-Pay:   DME:       Co-Pay:    Emergency Contact Information         Contact Information    Name Relation Home Work Mobile   [place] Daughter   [phone]   Braulio Bosch   [phone]   Smith,Julius Brother  [phone]   Jones,Cornella  Daughter   [phone]     Current Medical History  Patient Admitting Diagnosis: Hx of falls with left humerus fx, T1fx 10/29 and right L5 pars defect/epidural abscess related to fall on 11/12.   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 Per report patient lives with daughterand has a home health aide 3 hours a day patent through [facility] and was needing quite a bit of assistance prior to this most recent admission. Daughter works during the day.Patient with recent admission 06/16/2018 to 06/23/2018 after a fall sustaining left proximal humerus neck and greater tuberosity as well as acute minimally displaced fracture T10 vertebral body.Patient was placed in a TLSO back brace per neurosurgery. Nonweightbearing left upper extremity 6 weeks per orthopedic services Dr. Mathis Dad no surgical intervention. Presented 06/30/2018 with increasing back pain. MRI lumbar spine shows acute fracture right L5 pars intra-articularis defect and a new grade 1 L5-S1 anterior listhesis. 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 Patient currently remains on Rocephin as prior to admission. Scans reviewed by neurosurgery no surgical intervention required. Therapy evaluations have been completed. Patient has adamantly refused skilled nursing facility. M.D. Has requested physical medicine rehabilitation consult.   Past Medical History      Past Medical History:  Diagnosis Date  . Anxiety   . Arthritis    "just about qwhere" (06/30/2018)  . CHF (congestive heart failure) ([place])    [name].......LAST OFFICE VISIT WAS A FEW AGO  . CHF (congestive heart failure) ([name]) 03/06/2015  . Chronic bronchitis  ([place])    "just about q yr; use nebulizer prn" (03/17/2015)  . Chronic lower back pain   . Complication of anesthesia    @ [place].....COULDN'T GET HER AWAKE....FOR  HERNIA SURGERY... PLACED ON VENTILATOR; woke up during cyst excision OR on my back"; 07/06/09 VHR: re-intubated in PACU due to hypoventilation, extubated POD#1  . Cyst of right kidney   . Discitis of lumbosacral region 03/06/2015  . 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 GERD (gastroesophageal reflux disease)    takes nexium  . Heart palpitations    takes Metoprolol  . Herpes simplex infection    LEFT  EYE----2 YR AGO  . 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 Hypercholesteremia   . Hypertension    dr [name]  . IBS (irritable bowel syndrome) 03/06/2015  . Intertrigo 09/01/2017  . Klebsiella infection 02/28/2016  . Migraine    "stopped years ago" (06/30/2018)  . Pneumonia    "twice" (06/30/2018)  . Proteus infection 02/28/2016  . Pruritic condition 04/15/2016  . Sleep difficulties    pt. states she had sleep eval > 10 yrs. ago in [place], no apnea found  . 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 denies  . Zoster 03/06/2015    Family History  family history includes Breast cancer in her maternal aunt; Hypertension in her brother, daughter, father, maternal grandfather, maternal grandmother, mother, paternal grandfather, and paternal grandmother.  Prior Rehab/Hospitalizations: Had home PT/OT about 2 yrs ago through [facility]  Has the patient had major surgery during 100 days prior to admission? No  Current Medications   Current Facility-Administered Medications:  .  albuterol (PROVENTIL) (2.5 MG/3ML) 0.083% nebulizer solution 2.5 mg, 2.5 mg, Inhalation, Q4H PRN, Etta Quill, DO .  alum &  mag hydroxide-simeth (MAALOX/MYLANTA) 200-200-20 MG/5ML suspension 30 mL, 30 mL, Oral, Q6H PRN, Kc, Ramesh, MD, 30 mL at 07/02/18 1333 .  cefTRIAXone (ROCEPHIN) 2 g in sodium chloride 0.9 % 100 mL IVPB, 2 g,  Intravenous, Q24H, Comer, Okey Regal, MD, Last Rate: 200 mL/hr at 07/02/18 1610, 2 g at 07/02/18 1610 .  citalopram (CELEXA) tablet 20 mg, 20 mg, Oral, Daily, Lajean Saver, MD, 20 mg at 07/03/18 0852 .  diphenhydrAMINE (BENADRYL) capsule 50 mg, 50 mg, Oral, Q6H PRN, Kc, Ramesh, MD, 50 mg at 07/02/18 1611 .  enoxaparin (LOVENOX) injection 40 mg, 40 mg, Subcutaneous, Daily, Jennette Kettle M, DO, 40 mg at 07/03/18 0851 .  gabapentin (NEURONTIN) capsule 300-600 mg, 300-600 mg, Oral, Daily PRN, Jennette Kettle M, DO, 300 mg at 07/03/18 1030 .  hydrALAZINE (APRESOLINE) tablet 25 mg, 25 mg, Oral, Q6H PRN, Kc, Ramesh, MD .  hydrALAZINE (APRESOLINE) tablet 25 mg, 25 mg, Oral, TID, Kc, Ramesh, MD, 25 mg at 07/03/18 1159 .  HYDROmorphone (DILAUDID) injection 0.5-1 mg, 0.5-1 mg, Intravenous, Q4H PRN, Etta Quill, DO, 1 mg at 07/03/18 1305 .  labetalol (NORMODYNE) tablet 200 mg, 200 mg, Oral, BID, Lajean Saver, MD, 200 mg at 07/03/18 0852 .  methocarbamol (ROBAXIN) tablet 500 mg, 500 mg, Oral, BID, Lajean Saver, MD, 500 mg at 07/03/18 0852 .  ondansetron (ZOFRAN) tablet 4 mg, 4 mg, Oral, Q6H PRN **OR** ondansetron (ZOFRAN) injection 4 mg, 4 mg, Intravenous, Q6H PRN, Alcario Drought, Jared M, DO .  pantoprazole (PROTONIX) EC tablet 40 mg, 40 mg, Oral, Daily, Lajean Saver, MD, 40 mg at 07/03/18 0900 .  senna-docusate (Senokot-S) tablet 1 tablet, 1 tablet, Oral, QHS, Gardner, Jared M, DO, 1 tablet at 07/01/18 2300 .  sodium chloride flush (NS) 0.9 % injection 10-40 mL, 10-40 mL, Intracatheter, PRN, Kc, Ramesh, MD  Patients Current Diet:     Diet Order                  Diet - low sodium heart healthy         Diet Heart Room service appropriate? Yes; Fluid consistency: Thin  Diet effective now               Precautions / Restrictions Precautions Precautions: Fall, Back Precaution Booklet Issued: No Precaution Comments: Reviewed back precautions with pt. Per previous admission notes TLSO when  OOB.  Spinal Brace: Thoracolumbosacral orthotic Restrictions Weight Bearing Restrictions: Yes LUE Weight Bearing: Non weight bearing   Has the patient had 2 or more falls or a fall with injury in the past year?Yes.  Patient reports at least 3 falls in the past year  Prior Activity Level Limited Community (1-2x/wk): Went out 2 X a week.  Went to church on Sundays.  Does not drive.  Home Assistive Devices / Equipment Home Assistive Devices/Equipment: Civil engineer, contracting with back, Environmental consultant (specify type), Cane (specify quad or straight), Eyeglasses, Wheelchair, Grab bars in shower(left arm sling) Home Equipment: Environmental consultant - 2 wheels, Bedside commode, Wheelchair - manual, Cane - single point  Prior Device Use: Indicate devices/aids used by the patient prior to current illness, exacerbation or injury? Manual wheelchair, Walker and 2 canes  Prior Functional Level Prior Function Level of Independence: Needs assistance Gait / Transfers Assistance Needed: Since previous admission has needed assist with transfers to [facility]. Reports she has not been moving much since she has been at home.  ADL's / Homemaking Assistance Needed: Requires assist for ADL tasks.  Comments: has aide 2 hours  per day  Self Care: Did the patient need help bathing, dressing, using the toilet or eating?  Needed some help.  An aide assists with bathing patient's backside  Indoor Mobility: Did the patient need assistance with walking from room to room (with or without device)? Independent  Stairs: Did the patient need assistance with internal or external stairs (with or without device)? Needed some help  Functional Cognition: Did the patient need help planning regular tasks such as shopping or remembering to take medications? Independent  Current Functional Level Cognition  Overall Cognitive Status: Within Functional Limits for tasks assessed Orientation Level: Oriented X4 General Comments: anxious as she associates moving  with pain    Extremity Assessment (includes Sensation/Coordination)  Upper Extremity Assessment: LUE deficits/detail RUE Deficits / Details: h/o shoulder sx. Able to feed self with set up and HOB raised LUE Deficits / Details: limited ROM due to pain from fx, sling utilized during transfers and NWB maintained throughout session, able to perform hand, wrist, supination/pronation, elbow flexion triggers pain LUE: Unable to fully assess due to pain, Unable to fully assess due to immobilization LUE Sensation: WNL LUE Coordination: decreased gross motor  Lower Extremity Assessment: Defer to PT evaluation RLE Deficits / Details: Generalized weakness throughout. Reports numbness and tingling into feet.  LLE Deficits / Details: Generalized weakness throughout. Reports numbness and tingling into feet.     ADLs  Overall ADL's : Needs assistance/impaired Eating/Feeding: Minimal assistance, Sitting Eating/Feeding Details (indicate cue type and reason): to open containers and set up Grooming: Minimal assistance, Moderate assistance, Sitting Upper Body Bathing: Maximal assistance, Sitting Lower Body Bathing: Moderate assistance, +2 for physical assistance, +2 for safety/equipment, Sit to/from stand Upper Body Dressing : Maximal assistance, Sitting Upper Body Dressing Details (indicate cue type and reason): to don brace and sling Lower Body Dressing: Maximal assistance, +2 for physical assistance, +2 for safety/equipment, Sit to/from stand Lower Body Dressing Details (indicate cue type and reason): unable to bend for LB ADL Toilet Transfer: Moderate assistance, +2 for physical assistance, +2 for safety/equipment(2 person HHA) Toilet Transfer Details (indicate cue type and reason): simulated through recliner transfer [place] and Hygiene: Maximal assistance, +2 for physical assistance, +2 for safety/equipment, Sit to/from stand Toileting - Clothing Manipulation Details  (indicate cue type and reason): Pt unable to reach peri area at bed level Functional mobility during ADLs: Minimal assistance, +2 for physical assistance, Min guard, +2 for safety/equipment(2 person HHA, SPT only) General ADL Comments: decreased access to LB for ADL    Mobility  Overal bed mobility: Needs Assistance Bed Mobility: Rolling, Sidelying to Sit Rolling: Mod assist, Max assist, +2 for physical assistance Sidelying to sit: Mod assist, +2 for physical assistance General bed mobility comments: Mod A to sit with PT behind patient for safety and guarding of L UE and with patient using OT as "grab bar" to pull up on     Transfers  Overall transfer level: Needs assistance Equipment used: 2 person hand held assist Transfers: Sit to/from Stand, Stand Pivot Transfers Sit to Stand: Mod assist, +2 safety/equipment Stand pivot transfers: Mod assist, +2 safety/equipment General transfer comment: pateient pulling up on OT; PT providing cueing and guarding throughout transfers    Ambulation / Gait / Stairs / Wheelchair Mobility  Ambulation/Gait General Gait Details: able to perform static marches with 1 person HHA x 5 and then shuffle steps to recliner from bed    Posture / Balance Balance Overall balance assessment: Needs assistance, History of Falls Sitting-balance  support: Feet supported, Single extremity supported Sitting balance-Leahy Scale: Fair Standing balance support: Single extremity supported, During functional activity Standing balance-Leahy Scale: Poor Standing balance comment: reliant on single UE support    Special needs/care consideration BiPAP/CPAP No CPM No Continuous Drip IV No Dialysis No     Life Vest No Oxygen No Special Bed No Trach Size No Wound Vac (area) No   Skin Has a sling to left shoulder                              Bowel mgmt: Last BM 07/03/18 Bladder mgmt: Has a purwick external catheter in place Diabetic mgmt No    Previous Home  Environment Living Arrangements: Children Available Help at Discharge: Personal care attendant, Family, Available PRN/intermittently Type of Home: Apartment Home Layout: One level Home Access: Level entry, Building control surveyor Shower/Tub: Multimedia programmer: Handicapped height Bathroom Accessibility: Yes How Accessible: Accessible via walker Home Care Services: No Type of Home Care Services: Homehealth aide, Home PT, Home OT, [place] (if known): "Home Advance" Additional Comments: daughter currently living with patient and works  Discharge Living Setting Plans for Discharge Living Setting: Lives with (comment), Apartment(Daughter and grandson stay with her.) Type of Home at Discharge: Apartment Discharge Home Layout: One level Discharge Home Access: Level entry Discharge Bathroom Shower/Tub: Tub/shower unit, Curtain Discharge Bathroom Toilet: Handicapped height Discharge Bathroom Accessibility: Yes How Accessible: Accessible via walker Does the patient have any problems obtaining your medications?: No  Social/Family/Support Systems Patient Roles: Parent(Has 2 daughters, 1 son and a grandson) Sport and exercise psychologist Information: Meredith Mody - daughter Anticipated Caregiver: daughter and grandson Anticipated Caregiver's Contact Information: Le[place] Reader - daughter - [phone] Ability/Limitations of Caregiver: Daughters and son and grandson work.  Has a aide 2-3 hours a day, 7 days a week Caregiver Availability: Intermittent Discharge Plan Discussed with Primary Caregiver: Yes Is Caregiver In Agreement with Plan?: Yes Does Caregiver/Family have Issues with Lodging/Transportation while Pt is in Rehab?: No  Goals/Additional Needs Patient/Family Goal for Rehab: PT supervision, OT mod I to supervision to min assist goals Expected length of stay: 15-20 days Cultural Considerations: [employer] pent[employer]al Dietary Needs: Heart diet, thin liquids.  Patient follow a low  Na diet due to CHF Equipment Needs: TBD Pt/Family Agrees to Admission and willing to participate: Yes Program Orientation Provided & Reviewed with Pt/Caregiver Including Roles  & Responsibilities: Yes  Decrease burden of Care through IP rehab admission: N/A  Possible need for SNF placement upon discharge: Not anticipated  Patient Condition: This patient's condition remains as documented in the consult dated 07/03/18, in which the Rehabilitation Physician determined and documented that the patient's condition is appropriate for intensive rehabilitative care in an inpatient rehabilitation facility. Will admit to inpatient rehab today.  Preadmission Screen Completed By:  Retta Diones, 07/03/2018 2:39 PM ______________________________________________________________________   Discussed status with Dr. Naaman Plummer on 07/03/18 at 1439 and received telephone approval for admission today.  Admission Coordinator:  Retta Diones, time 1439/Date 07/03/18           Cosigned by: Meredith Staggers, MD at 07/03/2018 3:02 PM  Revision History

## 2018-07-06 NOTE — Progress Notes (Signed)
Physical Therapy Session Note  Patient Details  Name: [name] MRN: [id number] Date of Birth: 1951-08-13  Today's Date: 07/06/2018 PT Individual Time: 1100-1130 PT Individual Time Calculation (min): 30 min  Short Term Goals: Week 1:  PT Short Term Goal 1 (Week 1): =LTGs due to ELOS  Skilled Therapeutic Interventions/Progress Updates:   Pt reporting significant pain in L shoulder - already addressed by RN and PA. Pt reports pain patch has not kicked in yet. Request to stay in the bed. Rolling with max assist due to pain and decreased weightbearing ability to the LUE in order to remove TLSO (+2 assist needed to remove brace and maintain pt in position tolerable due to pain). Focused on BLE strengthening therex with 3# ankle weight to LLE including BLE heel slides, hip abduction with quad set, and SAQ x 10 reps each with cues for technique. Cues and education provided on use of diaphragmatic breathing during exercise and for pain control with activity.   Therapy Documentation Precautions:  Precautions Precautions: Fall, Back Precaution Comments: pt recalls 3/3 precautions  Required Braces or Orthoses: Spinal Brace, Sling Spinal Brace: Thoracolumbosacral orthotic, Applied in sitting position Restrictions Weight Bearing Restrictions: Yes LUE Weight Bearing: Non weight bearing Pain: Pain Assessment Pain Scale: 0-10 Pain Score: 4  Pain Type: Acute pain;Neuropathic pain Pain Location: Leg Pain Orientation: Right Pain Descriptors / Indicators: Aching;Moaning;Crying;Throbbing Pain Frequency: Intermittent Pain Onset: On-going Patients Stated Pain Goal: 3 Pain Intervention(s): Medication (See eMAR)   Therapy/Group: Individual Therapy  Canary Brim Ivory Broad, PT, DPT, CBIS  07/06/2018, 12:09 PM

## 2018-07-06 NOTE — Progress Notes (Signed)
Patient information reviewed and entered into eRehab system by Ibeth Fahmy, RN, CRRN, PPS Coordinator.  Information including medical coding, functional ability and quality indicators will be reviewed and updated through discharge.     Per nursing patient was given "Data Collection Information Summary for Patients in Inpatient Rehabilitation Facilities with attached "Privacy Act Statement-Health Care Records" upon admission.  

## 2018-07-06 NOTE — Care Management Note (Signed)
[facility] Individual Statement of Services  Patient Name:  [name]  Date:  07/06/2018  Welcome to the [facility].  Our goal is to provide you with an individualized program based on your diagnosis and situation, designed to meet your specific needs.  With this comprehensive rehabilitation program, you will be expected to participate in at least 3 hours of rehabilitation therapies Monday-Friday, with modified therapy programming on the weekends.  Your rehabilitation program will include the following services:  Physical Therapy (PT), Occupational Therapy (OT), 24 hour per day rehabilitation nursing, Neuropsychology, Case Management (Social Worker), Rehabilitation Medicine, Nutrition Services and Pharmacy Services  Weekly team conferences will be held on Wednesday to discuss your progress.  Your Social Worker will talk with you frequently to get your input and to update you on team discussions.  Team conferences with you and your family in attendance may also be held.  Expected length of stay: 7-10 days  Overall anticipated outcome: supervision with PT and mod assist with ADL's  Depending on your progress and recovery, your program may change. Your Social Worker will coordinate services and will keep you informed of any changes. Your Social Worker's name and contact numbers are listed  below.  The following services may also be recommended but are not provided by the [facility]:    [place] will be made to provide these services after discharge if needed.  Arrangements include referral to agencies that provide these services.  Your insurance has been verified to be:  Medicare Part B and Medicaid Your primary doctor is:  [name]  Pertinent information will be shared with your doctor and your insurance company.  Social Worker:   Ovidio Kin, [place] or [phone]  Information discussed with and copy given to patient by: Elease Hashimoto, 07/06/2018, 10:42 AM
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
## 2018-07-06 NOTE — Progress Notes (Signed)
Physical Therapy Session Note  Patient Details  Name: [name] MRN: [id number] Date of Birth: [birth year]  Today's Date: 07/06/2018 PT Individual Time: 1400-1530 PT Individual Time Calculation (min): 90 min   Short Term Goals: Week 1:  PT Short Term Goal 1 (Week 1): =LTGs due to ELOS  Skilled Therapeutic Interventions/Progress Updates:    Patient received in bed, pleasant but reporting ongoing pain and increased fatigue this afternoon, states that she has been premedicated by RN and is willing to try session with PT. Required verbal review of precautions for her back and L UE prior to activity this session. She requires MaxA for rolling and sidelying to sit to maintain back precautions today, required totalA to apply TLSO and adjust sling in sitting position as well. All activities performed with extended time due to pain and fatigue this afternoon.  Able to perform transfers from bed to chair with Min-ModA and HHA and was transported to PT gym totalA in [facility] for time management. Worked on standing tolerance with various activities including but not limited to bean bag toss, connect 4, and standing LE exercises at parallel bars. Patient limited in all activities by fatigue and required extended rest breaks between bouts of activity. She was transported back to her room in [facility] with totalA and performed transfer back to bed with MinA however did continue to require [place] for sit to sidelying, cues for sidelying to rolling to maintain back precautions. She was left in bed with all needs met and bed alarm activated.   Therapy Documentation Precautions:  Precautions Precautions: Fall, Back Precaution Comments: pt recalls 3/3 precautions  Required Braces or Orthoses: Spinal Brace, Sling Spinal Brace: Thoracolumbosacral orthotic, Applied in sitting position Restrictions Weight Bearing Restrictions: Yes LUE Weight Bearing: Non weight bearing General:   Pain: Pain Assessment Pain Scale:  0-10 Pain Score: 6  Pain Type: Acute pain Pain Location: Arm Pain Orientation: Left Pain Descriptors / Indicators: Aching;Throbbing;Sore Pain Frequency: Intermittent Pain Onset: On-going Patients Stated Pain Goal: 0 Pain Intervention(s): Medication (See eMAR);Repositioned;Ambulation/increased activity;Emotional support Multiple Pain Sites: No    Therapy/Group: Individual Therapy  Deniece Ree PT, DPT, CBIS  Supplemental Physical Therapist [facility]    Pager [phone] Acute Rehab Office [phone]   07/06/2018, 3:50 PM
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
## 2018-07-07 ENCOUNTER — Inpatient Hospital Stay (HOSPITAL_COMMUNITY): Payer: Self-pay | Admitting: Physical Therapy

## 2018-07-07 ENCOUNTER — Inpatient Hospital Stay (HOSPITAL_COMMUNITY): Payer: Self-pay | Admitting: Occupational Therapy

## 2018-07-07 ENCOUNTER — Inpatient Hospital Stay (HOSPITAL_COMMUNITY): Payer: Medicare Other

## 2018-07-07 DIAGNOSIS — I5041 Acute combined systolic (congestive) and diastolic (congestive) heart failure: Secondary | ICD-10-CM

## 2018-07-07 LAB — CULTURE, BLOOD (ROUTINE X 2)
CULTURE: NO GROWTH
Culture: NO GROWTH
SPECIAL REQUESTS: ADEQUATE
Special Requests: ADEQUATE

## 2018-07-07 LAB — CBC
HEMATOCRIT: 32.6 % — AB (ref 36.0–46.0)
HEMOGLOBIN: 9.6 g/dL — AB (ref 12.0–15.0)
MCH: 25.5 pg — ABNORMAL LOW (ref 26.0–34.0)
MCHC: 29.4 g/dL — ABNORMAL LOW (ref 30.0–36.0)
MCV: 86.7 fL (ref 80.0–100.0)
Platelets: 215 10*3/uL (ref 150–400)
RBC: 3.76 MIL/uL — ABNORMAL LOW (ref 3.87–5.11)
RDW: 19.7 % — ABNORMAL HIGH (ref 11.5–15.5)
WBC: 5.6 10*3/uL (ref 4.0–10.5)
nRBC: 0 % (ref 0.0–0.2)

## 2018-07-07 MED ORDER — DIPHENHYDRAMINE HCL 25 MG PO CAPS
25.0000 mg | ORAL_CAPSULE | Freq: Four times a day (QID) | ORAL | Status: DC | PRN
  Administered 2018-07-08: 25 mg via ORAL
  Filled 2018-07-07: qty 1

## 2018-07-07 MED ORDER — ACETAMINOPHEN 325 MG PO TABS
650.0000 mg | ORAL_TABLET | Freq: Four times a day (QID) | ORAL | Status: DC | PRN
  Administered 2018-07-07 – 2018-07-11 (×2): 650 mg via ORAL
  Filled 2018-07-07 (×2): qty 2

## 2018-07-07 NOTE — Progress Notes (Signed)
Occupational Therapy Session Note  Patient Details  Name: [name] MRN: [id number] Date of Birth: 05/17/1951  Today's Date: 07/07/2018 OT Individual Time: 1103-1205 OT Individual Time Calculation (min): 62 min    Short Term Goals: Week 1:     Skilled Therapeutic Interventions/Progress Updates:    Patient seated in recliner and ready for therapy session.  She states that she recently had pain meds and is not currently in any pain.   Functional transfer:  SPT HHA to/from recliner, bariatric commode, w/c with min A to steady and increased time ADL:  Completed LB bathing (seated in w/c) as patient states that her upper body was washed earlier in the day - max A to wash buttocks, legs and feet.  Reviewed and provided toilet aide and long handled sponge.   Don/doff of sling and TLSO is dependent.  Provided 1/2 lap tray to support L UE when in w/c.  Upper body dressing with mod A.  Donned incontinence brief with max A.  She is able to stand for at least 2 minutes during activity with CG/CS Patient is alert and aware of her needs.  She opted to remain in the w/c at close of session for lunch.   Therapy Documentation Precautions:  Precautions Precautions: Fall, Back Precaution Comments: pt recalls 3/3 precautions  Required Braces or Orthoses: Spinal Brace, Sling Spinal Brace: Thoracolumbosacral orthotic, Applied in sitting position Restrictions Weight Bearing Restrictions: Yes LUE Weight Bearing: Non weight bearing General:   Vital Signs: Therapy Vitals Temp: 99 F (37.2 C) Temp Source: Oral Pulse Rate: 75 Resp: 20 BP: (!) 136/94 Patient Position (if appropriate): Sitting Oxygen Therapy SpO2: 97 % O2 Device: Room Air Pain: Pain Assessment Pain Scale: 0-10 Pain Score: 1  Pain Location: Arm Pain Orientation: Left Pain Descriptors / Indicators: Aching;Sore Patients Stated Pain Goal: 5 Pain Intervention(s): Medication (See eMAR);Heat applied   Therapy/Group:  Individual Therapy  Carlos Levering 07/07/2018, 3:41 PM

## 2018-07-07 NOTE — Progress Notes (Signed)
Physical Therapy Session Note  Patient Details  Name: [name] MRN: [id number] Date of Birth: August 22, 1950  Today's Date: 07/07/2018 PT Individual Time: 0102-7253 PT Individual Time Calculation (min): 55 min   Short Term Goals: Week 1:  PT Short Term Goal 1 (Week 1): =LTGs due to ELOS  Skilled Therapeutic Interventions/Progress Updates:    Pt received seated in w/c in room, agreeable to PT. Pt reports onset of low back and RLE pain at rest that increases throughout therapy session. See pain details below. Heating pad to low back at end of therapy session for pain relief. Re-checked on patient one hour after therapy session and she reported relief of low back pain with heating pad. Seated BLE therex x 10-15 reps with orange theraband: marches, LAQ, heel/toe raises, hip abd squeeze, HS curls. Sit to stand with min A. SPT w/c to bed with min A. Sit to supine mod A for BLE management. Pt left semi-reclined in bed with needs in reach, L arm positioned with pillows for comfort, bed alarm in place.  Therapy Documentation Precautions:  Precautions Precautions: Fall, Back Precaution Comments: pt recalls 3/3 precautions  Required Braces or Orthoses: Spinal Brace, Sling Spinal Brace: Thoracolumbosacral orthotic, Applied in sitting position Restrictions Weight Bearing Restrictions: Yes LUE Weight Bearing: Non weight bearing  Pain: Pain Assessment Pain Scale: 0-10 Pain Score: 8  Pain Location: Arm Pain Orientation: Left Pain Descriptors / Indicators: Aching;Sore Patients Stated Pain Goal: 5 Pain Intervention(s): Medication (See eMAR);Heat applied    Therapy/Group: Individual Therapy   Excell Seltzer, PT, DPT  07/07/2018, 2:59 PM

## 2018-07-07 NOTE — Progress Notes (Signed)
[place] PHYSICAL MEDICINE & REHABILITATION PROGRESS NOTE  Subjective/Complaints: Patient seen lying in bed this morning.  She states he slept well overnight.  She notes improvement with Lidoderm patch.  ROS: Denies CP, shortness of breath, nausea, vomiting, diarrhea.  Objective: Vital Signs: Blood pressure (!) 121/59, pulse 73, temperature 98.6 F (37 C), temperature source Oral, resp. rate 19, height 5\' 3"  (1.6 m), weight 125.5 kg, SpO2 96 %. Vas [name] Lower Extremity Venous (dvt)  Result Date: 07/06/2018  Lower Venous Study Indications: Pain, and Rehab for broken shoulder and back.  Limitations: Body habitus and pain with compression. Performing Technologist: Sharion Dove RVS  Examination Guidelines: A complete evaluation includes B-mode imaging, spectral Doppler, color Doppler, and power Doppler as needed of all accessible portions of each vessel. Bilateral testing is considered an integral part of a complete examination. Limited examinations for reoccurring indications may be performed as noted.  Right Venous Findings: +---------+---------------+---------+-----------+----------+-------+          CompressibilityPhasicitySpontaneityPropertiesSummary +---------+---------------+---------+-----------+----------+-------+ CFV      Full           Yes      Yes                          +---------+---------------+---------+-----------+----------+-------+ SFJ      Full           Yes      Yes                          +---------+---------------+---------+-----------+----------+-------+ FV Prox                 Yes      Yes                          +---------+---------------+---------+-----------+----------+-------+ FV Mid                  Yes      Yes                          +---------+---------------+---------+-----------+----------+-------+ FV Distal               Yes      Yes                           +---------+---------------+---------+-----------+----------+-------+ POP                     Yes      Yes                          +---------+---------------+---------+-----------+----------+-------+ PTV                     Yes      Yes                          +---------+---------------+---------+-----------+----------+-------+ PERO                    Yes      Yes                          +---------+---------------+---------+-----------+----------+-------+  Left Venous Findings: +---------+---------------+---------+-----------+----------+-------+          CompressibilityPhasicitySpontaneityPropertiesSummary +---------+---------------+---------+-----------+----------+-------+  CFV      Full           Yes      Yes                          +---------+---------------+---------+-----------+----------+-------+ SFJ      Full           Yes      Yes                          +---------+---------------+---------+-----------+----------+-------+ FV Prox                 Yes      Yes                          +---------+---------------+---------+-----------+----------+-------+ FV Mid                  Yes      Yes                          +---------+---------------+---------+-----------+----------+-------+ FV Distal               Yes      Yes                          +---------+---------------+---------+-----------+----------+-------+ POP                     Yes      Yes                          +---------+---------------+---------+-----------+----------+-------+ PTV                     Yes      Yes                          +---------+---------------+---------+-----------+----------+-------+ PERO                    Yes      Yes                          +---------+---------------+---------+-----------+----------+-------+    Summary: Right: There is no evidence of deep vein thrombosis in the lower extremity. However, portions of this examination were  limited- see technologist comments above. Left: There is no evidence of deep vein thrombosis in the lower extremity. However, portions of this examination were limited- see technologist comments above.  *See table(s) above for measurements and observations. Electronically signed by Servando Snare MD on 07/06/2018 at 4:22:37 PM.    Final    Recent Labs    07/06/18 0259  WBC 5.8  HGB 9.3*  HCT 32.2*  PLT 230   Recent Labs    07/06/18 0259  NA 136  K 3.9  CL 102  CO2 26  GLUCOSE 100*  BUN 6*  CREATININE 0.95  CALCIUM 8.4*    Physical Exam: BP (!) 121/59 (BP Location: Left Arm)   Pulse 73   Temp 98.6 F (37 C) (Oral)   Resp 19   Ht 5\' 3"  (1.6 m)   Wt 125.5 kg   SpO2 96%   BMI 49.01 kg/m  Constitutional: NAD.  Well-developed.  Obese.  HENT: Normocephalicand atraumatic.  Eyes:EOMI.  No discharge. Cardiovascular:RRR.  No JVD. Respiratory:Effort normal and breath sounds normal.  GI: She exhibitsno distension.  Bowel sounds normal. Musculoskeletal: Left upper extremity with shoulder sling. Left shoulder tender to touch.  Neurological: She isalertand oriented. Patient is alert and oriented.  Follows full commands.  Motor: LUE is limited by pain and sling.  RUE 5/5 proximal to distal.  Left lower extremity: 4+/5 proximal distal Right lower extremity: 4-4+/5 proximal to distal Skin: Skin iswarmand dry.  Psychiatric:Pleasant and appropriate.  Assessment/Plan: 1. Functional deficits secondary to multi-Ortho with epidural abscess which require 3+ hours per day of interdisciplinary therapy in a comprehensive inpatient rehab setting.  Physiatrist is providing close team supervision and 24 hour management of active medical problems listed below.  Physiatrist and rehab team continue to assess barriers to discharge/monitor patient progress toward functional and medical goals  Care Tool:  Bathing    Body parts bathed by patient: Chest, Abdomen, Face   Body parts  bathed by helper: Right arm, Left arm Body parts n/a: Buttocks, Front perineal area, Right upper leg, Left upper leg, Right lower leg, Left lower leg(washing UB only this session )   Bathing assist Assist Level: Moderate Assistance - Patient 50 - 74%     Upper Body Dressing/Undressing Upper body dressing   What is the patient wearing?: Hospital gown only    Upper body assist Assist Level: Maximal Assistance - Patient 25 - 49%    Lower Body Dressing/Undressing Lower body dressing      What is the patient wearing?: (she refused to don skirt)     Lower body assist       Toileting Toileting    Toileting assist Assist for toileting: Moderate Assistance - Patient 50 - 74%     Transfers Chair/bed transfer  Transfers assist  Chair/bed transfer activity did not occur: Safety/medical concerns(increase pain)  Chair/bed transfer assist level: Minimal Assistance - Patient > 75%     Locomotion Ambulation   Ambulation assist   Ambulation activity did not occur: Safety/medical concerns  Assist level: (pain) Assistive device: Hand held assist Max distance: 8   Walk 10 feet activity   Assist     Assist level: Minimal Assistance - Patient > 75% Assistive device: Hand held assist   Walk 50 feet activity   Assist Walk 50 feet with 2 turns activity did not occur: Safety/medical concerns         Walk 150 feet activity   Assist Walk 150 feet activity did not occur: Safety/medical concerns         Walk 10 feet on uneven surface  activity   Assist Walk 10 feet on uneven surfaces activity did not occur: Safety/medical concerns         Wheelchair     Assist               Wheelchair 50 feet with 2 turns activity    Assist            Wheelchair 150 feet activity     Assist            Medical Problem List and Plan: 1.Decreased functional mobilitysecondary to history of falls with left humerus fracture, T10 fracture  06/16/2018-TLSO and a right L5 pars defect/epidural abscess related to recent fall.   Cont CIR 2. DVT Prophylaxis/Anticoagulation: Subcutaneous Lovenox.   Vascular study negative for DVT 3. Pain Management:Neurontin changed to scheduled dosing, Robaxin 500 mg increased to 750 3  times daily  Lidoderm patch started on 11/18, with improvement 4. Mood:Celexa20 mg daily 5. Neuropsych: This patientiscapable of making decisions on herown behalf. 6. Skin/Wound Care:Routine skin checks 7. Fluids/Electrolytes/Nutrition:Routine in and out's 8.Recurrent epidural abscess. Continue Rocephin until 08/25/2018.  9. Hypertension. Labetalol 200 mg twice a day  Controlled on 11/19 10.Chronic diastolic congestive heart failure. Monitor for signs of fluid overload Filed Weights   07/03/18 1718  Weight: 125.5 kg   Daily weights ordered 11.?  CKD stage III.   Creatinine 0.95 on 11/18 12. Morbid obesity. Dietary follow-up 13.  Acute blood loss anemia  Hemoglobin 9.3 on 11/18  LOS: 4 days A FACE TO FACE EVALUATION WAS PERFORMED  Ankit [name] 07/07/2018, 8:05 AM

## 2018-07-07 NOTE — Progress Notes (Signed)
Physical Therapy Session Note  Patient Details  Name: MYLIAH MEDEL MRN: [id number] Date of Birth: October 23, 1950  Today's Date: 07/07/2018 PT Individual Time: 5176-1607 PT Individual Time Calculation (min): 26 min   Short Term Goals: Week 1:  PT Short Term Goal 1 (Week 1): =LTGs due to ELOS  Skilled Therapeutic Interventions/Progress Updates:  Pt received in w/c & agreeable to tx. Pt reports 4/10 pain in buttocks but reports being premedicated. Transported pt to gym via w/c dependent assist for time management. Pt transfers sit>stand with min assist and ambulates 10 ft + 10 ft with R HHA min assist with seated rest breaks in between 2/2 fatigue; pt with wide BOS and decreased step & stride length BLE. Pt performed 5x sit<>stand with close supervision and RUE to assist with transfer with task focusing on BLE strengthening. Progressed to sit<>stand transfers without RUE support and CGA for BLE strengthening. At end of session pt left sitting in w/c in room with all needs in reach.  Therapy Documentation Precautions:  Precautions Precautions: Fall, Back Precaution Comments: pt recalls 3/3 precautions  Required Braces or Orthoses: Spinal Brace, Sling Spinal Brace: Thoracolumbosacral orthotic, Applied in sitting position Restrictions Weight Bearing Restrictions: Yes LUE Weight Bearing: Non weight bearing    Therapy/Group: Individual Therapy  [name] 07/07/2018, 4:12 PM

## 2018-07-07 NOTE — Progress Notes (Signed)
Physical Therapy Session Note  Patient Details  Name: [name] MRN: [id number] Date of Birth: 1950-10-23  Today's Date: 07/07/2018 PT Individual Time: 0915-0955 PT Individual Time Calculation (min): 40 min   Short Term Goals: Week 1:  PT Short Term Goal 1 (Week 1): =LTGs due to ELOS  Skilled Therapeutic Interventions/Progress Updates:   Pt in supine and agreeable to therapy, pain as detailed below and reports feeling much better than yesterday. Transferred to EOB w/ mod assist and verbal cues for technique/safety. Donned gown on back and TLSO w/ total assist. Pt requesting to toilet prior to leaving room. Stand pivot transfer to [facility] and then to w/c, min assist via HHA. Total assist for pericare per pt's request. Total assist w/c transport to/from therapy gym. Performed NuStep 10 min @ level 1 w/ BLEs and RUE only to work on aerobic endurance, LE strengthening, and overall activity tolerance. No increase in pain w/ any OOB activity this session and pt very encouraged to be out of her room. Returned to room and ended session in recliner, all needs in reach.   Therapy Documentation Precautions:  Precautions Precautions: Fall, Back Precaution Comments: pt recalls 3/3 precautions  Required Braces or Orthoses: Spinal Brace, Sling Spinal Brace: Thoracolumbosacral orthotic, Applied in sitting position Restrictions Weight Bearing Restrictions: Yes LUE Weight Bearing: Non weight bearing Pain: Pain Assessment Pain Scale: 0-10 Pain Score: 4  Pain Type: Acute pain Pain Location: Leg Pain Orientation: Right;Left Pain Descriptors / Indicators: Aching;Throbbing Pain Frequency: Intermittent Pain Onset: On-going Patients Stated Pain Goal: 3 Pain Intervention(s): Medication (See eMAR)  Therapy/Group: Individual Therapy  Bing Duffey K Febe Champa 07/07/2018, 10:10 AM

## 2018-07-08 ENCOUNTER — Inpatient Hospital Stay (HOSPITAL_COMMUNITY): Payer: Self-pay

## 2018-07-08 ENCOUNTER — Inpatient Hospital Stay (HOSPITAL_COMMUNITY): Payer: Self-pay | Admitting: Physical Therapy

## 2018-07-08 DIAGNOSIS — R509 Fever, unspecified: Secondary | ICD-10-CM

## 2018-07-08 DIAGNOSIS — R6883 Chills (without fever): Secondary | ICD-10-CM

## 2018-07-08 LAB — URINALYSIS, COMPLETE (UACMP) WITH MICROSCOPIC
GLUCOSE, UA: NEGATIVE mg/dL
HGB URINE DIPSTICK: NEGATIVE
Ketones, ur: NEGATIVE mg/dL
NITRITE: NEGATIVE
PH: 7 (ref 5.0–8.0)
Protein, ur: NEGATIVE mg/dL
SPECIFIC GRAVITY, URINE: 1.023 (ref 1.005–1.030)

## 2018-07-08 NOTE — Patient Care Conference (Signed)
Inpatient RehabilitationTeam Conference and Plan of Care Update Date: 07/08/2018   Time: 2:25 PM    Patient Name: [name]      Medical Record Number: [id number]  Date of Birth: 1951-02-06 Sex: Female         Room/Bed: 4M04C/4M04C-01 Payor Info: Payor: MEDICARE / Plan: MEDICARE PART B / Product Type: *No Product type* /    Admitting Diagnosis: L Humerus FX and fall  Admit Date/Time:  07/03/2018  4:38 PM Admission Comments: No comment available   Primary Diagnosis:  <principal problem not specified> Principal Problem: <principal problem not specified>  Patient Active Problem List   Diagnosis Date Noted  . Fever and chills   . Chills   . Acute combined systolic and diastolic congestive heart failure ([place])   . Postoperative pain   . Stage 3 chronic kidney disease ([place])   . Benign essential HTN   . Acute blood loss anemia   . Neuropathic pain   . Lumbar pars defect   . Fracture of T10 vertebra ([place]) 06/29/2018  . Fever 06/18/2018  . Fracture of neck of left humerus, closed, initial encounter   . Sepsis ([place]) 06/16/2018  . Nausea vomiting and diarrhea 06/16/2018  . Back pain 06/16/2018  . Humerus fracture 06/16/2018  . Thrombocytopenia ([place]) 06/16/2018  . CKD (chronic kidney disease) stage 3, GFR 30-59 ml/min (H[facility]) 06/16/2018  . Depression 06/16/2018  . Physical deconditioning 06/16/2018  . Intertrigo 09/01/2017  . Protein-calorie malnutrition, severe 06/13/2017  . 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 Pill dysphagia   . Surgery, elective   . Multiple allergies   . Gram-negative infection   . 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 Obesity hypoventilation syndrome ([place]) 07/02/2016  . CHF (congestive heart failure), NYHA class II, acute on chronic, diastolic ([place]) 84/13/2440  . 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 Pruritic condition 04/15/2016  . 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 Klebsiella infection 02/28/2016  . Proteus infection 02/28/2016  .  Chronic pain syndrome 12/07/2015  . Elevated lipase 12/04/2015  . Intractable nausea and vomiting 12/04/2015  . Obstipation 12/04/2015  . Acute combined systolic and diastolic CHF, NYHA class 2 ([place]) 12/04/2015  . Nausea and vomiting 12/04/2015  . Uncontrollable vomiting   . Chronic combined systolic and diastolic CHF, NYHA class 1 ([place])   . Anemia due to other cause   . Wound infection after surgery 11/02/2015  . S/P lumbar spinal fusion 10/25/2015  . Morbid obesity ([place]) 03/17/2015  . 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 Hypertension 03/17/2015  . GERD (gastroesophageal reflux disease) 03/17/2015  . Hyperlipidemia 03/17/2015  . AKI (acute kidney injury) ([place]) 03/17/2015  . Discitis of lumbosacral region 03/06/2015  . Chronic congestive heart failure ([place]) 03/06/2015  . Diarrhea 03/06/2015  . IBS (irritable bowel syndrome) 03/06/2015  . Zoster 03/06/2015  . 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 Delice Lesch Social Worker Present: Ovidio Kin, LCSW Nurse Present: Benjie Karvonen, RN PT Present: Barrie Folk, PT OT Present: Other (comment)(Stacey Kinter-OT) PPS Coordinator present : Daiva Nakayama, RN, CRRN     Current Status/Progress Goal Weekly Team Focus  Medical   Decreased functional mobility secondary to history of falls with left humerus fracture, T10 fracture 06/16/2018-TLSO and a right L5 pars defect/epidural abscess related to recent fall  Improve mobility, safety, HTN  See  above   Bowel/Bladder   cont of B&B, LBM 11/20  maintain continence without periods of incontinent  assess toileting needs of bowel and bladder   Swallow/Nutrition/ Hydration             ADL's   sit to stand and SPT CG, dependent for lower body dressing, max A for UB dressing and bathing due to L UE NWB  mod  A ADL  overall   funcitonal transfers, ADL with ADs   Mobility   min assist/CGA via HHA x1, short distance gait, mod-max bed mobility  supervision to min assist overall, household ambulator  OOB tolerance, pain management, all functional mobility    Communication             Safety/Cognition/ Behavioral Observations            Pain   medicated for L shoulder fx prn as ordered. pain level 0-10 pt states 7  pain < 3   Assess pain and medicate per orders, evaluate effectivness of medication   Skin   MASD to groin and buttocks, powders applied prn  improvement or resolved MASD  assess skin qshift and prn, assess L shoulder for skin breakdown       *See Care Plan and progress notes for long and short-term goals.     Barriers to Discharge  Current Status/Progress Possible Resolutions Date Resolved   Physician    Medical stability;Weight;Weight bearing restrictions     See above  Therapies, optimize BP meds, optimize pain meds      Nursing                  PT  Decreased caregiver support  has good support system, may be able to piece together 24/7 supervision when daughter is at work              OT                  SLP                SW Decreased caregiver support Doesn't have 24 hr care            Discharge Planning/Teaching Needs:  HOme with daughter and PCS aide will be times she is home alone, due to daughter works until 6:00 pm M-F. Trying to push herself in therapies.      Team Discussion:  Goals supervision-mod for LE bathing and dressing-bed mobility. BP elevate dthis am-MD monitoring. Back brace and sling too small will ask [employer] to look at and get different ones if appropriate. Pain biggest issue and MD adjusting pain meds and pt trying to work through the pain. IV antibiotics for 6-8 weeks. Encouraging pt to stay OOB and work on endurance  Revisions to Treatment Plan:  DC 11/27    Continued Need for Acute Rehabilitation Level of Care: The patient requires daily medical  management by a physician with specialized training in physical medicine and rehabilitation for the following conditions: Daily direction of a multidisciplinary physical rehabilitation program to ensure safe treatment while eliciting the highest outcome that is of practical value to the patient.: Yes Daily medical management of patient stability for increased activity during participation in an intensive rehabilitation regime.: Yes Daily analysis of laboratory values and/or radiology reports with any subsequent need for medication adjustment of medical intervention for : Post surgical problems;Blood pressure problems;Other   I attest that I was present, lead the team conference, and concur with the  assessment and plan of the team.   Elease Hashimoto 07/09/2018, 12:52 PM

## 2018-07-08 NOTE — Progress Notes (Signed)
Physical Therapy Session Note  Patient Details  Name: [name] MRN: [id number] Date of Birth: [birth year]  Today's Date: 07/08/2018 PT Individual Time: 0900-0955 PT Individual Time Calculation (min): 55 min   Short Term Goals: Week 1:  PT Short Term Goal 1 (Week 1): =LTGs due to ELOS  Skilled Therapeutic Interventions/Progress Updates:    Pt received seated in w/c with nursing staff present (adminstering meds); complaints of 8/10 pain as described below, but agreeable to PT session. Wheelchair transport to rehab gym for energy/time conservation. Once in gym, pt reports need to have a bowel movement. Transported back to room. Stand pivot transfer with HHA to [facility]; step placed under feet for comfort/proper positioning. Pt able to have bowel movement with increased time. CGA sit>stand with cues to use RUE to push up from [facility]; dependent for hygiene and clothing management. Stand pivot to/from w/c with HHA throughout session. Transported back to rehab gym. Seated LE strengthening exercises at edge of mat: LAQs and adduction squeezes. Pt reports the need for another bowel movement. Pt remained seated on BSC at the end of the session with needs in reach. Nursing staff made aware.    Therapy Documentation Precautions:  Precautions Precautions: Fall, Back Precaution Comments: pt recalls 3/3 precautions  Required Braces or Orthoses: Spinal Brace, Sling Spinal Brace: Thoracolumbosacral orthotic, Applied in sitting position Restrictions Weight Bearing Restrictions: Yes LUE Weight Bearing: Non weight bearing Pain: Pain Assessment Pain Scale: 0-10 Pain Score: 8  Pain Type: Acute pain Pain Location: Arm Pain Orientation: Left Pain Descriptors / Indicators: Aching;Constant;Discomfort;Grimacing Pain Frequency: Constant Pain Onset: On-going Patients Stated Pain Goal: 3 Pain Intervention(s): Medication (See eMAR);Emotional support Multiple Pain Sites: No   Therapy/Group: Individual  Therapy  [place] Donjuan Robison 07/08/2018, 11:07 AM

## 2018-07-08 NOTE — Progress Notes (Signed)
Occupational Therapy Session Note  Patient Details  Name: [name] MRN: [id number] Date of Birth: [birth year]  Today's Date: 07/08/2018 OT Individual Time: 1330-1415 OT Individual Time Calculation (min): 45 min    Short Term Goals: Week 1:   STG=LTG d/t ELOS  Skilled Therapeutic Interventions/Progress Updates:    Session focused on b/d tasks, standing tolerance, and toileting tasks. Pt c/o pain as described below throughout session. Pt required max A to doff dress d/t sling and TLSO management in sitting. Pt completed UB bathing with mod A, requiring assistance for cleaning under L breast and R arm. Pt completed LB bathing with mod A overall. Pt reported urgent need for BM. Pt completed stand pivot transfer to [facility] with mod A- c/o increasing fatigue. Pt required total A for clothing management and peri cleansing. Pt returned to sitting in recliner and was left with heat pack applied to R hip and all needs met.   Therapy Documentation Precautions:  Precautions Precautions: Fall, Back Precaution Comments: pt recalls 3/3 precautions  Required Braces or Orthoses: Spinal Brace, Sling Spinal Brace: Thoracolumbosacral orthotic, Applied in sitting position Restrictions Weight Bearing Restrictions: Yes LUE Weight Bearing: Non weight bearing   Vital Signs: Therapy Vitals Temp: 98.2 F (36.8 C) Temp Source: Oral Pulse Rate: 68 Resp: 16 BP: 122/75 Patient Position (if appropriate): Sitting Oxygen Therapy SpO2: 96 % O2 Device: Room Air Pain: Pain Assessment Pain Scale: 0-10 Pain Score: 7  Pain Type: Acute pain Pain Location: Leg Pain Orientation: Right Pain Radiating Towards: (R) Foot  Pain Descriptors / Indicators: Aching;Throbbing Pain Frequency: Intermittent Pain Onset: Sudden Patients Stated Pain Goal: 3 Pain Intervention(s): Repositioned;Emotional support Multiple Pain Sites: No  Therapy/Group: Individual Therapy  Curtis Sites 07/08/2018, 3:49 PM

## 2018-07-08 NOTE — Progress Notes (Signed)
Social Work Patient ID: [name], female   DOB: 1950-11-06, 67 y.o.   MRN: [id number] Met with pt to discuss team conference goals supervision-min assist level and target discharge 11/27. She had a hospital bed ordered while on acute and will make aware of date along with her IV antibiotics. See if daughter can come in prior to pt's discharge for education.

## 2018-07-08 NOTE — Progress Notes (Signed)
[place] PHYSICAL MEDICINE & REHABILITATION PROGRESS NOTE  Subjective/Complaints: Patient seen laying in bed this AM.  She states she slept well overnight.  She states she feels better. Per chart review, patient had chills with low grade fever overnight.   ROS: Denies fevers, chills, CP, shortness of breath, nausea, vomiting, diarrhea.  Objective: Vital Signs: Blood pressure (!) 150/95, pulse 75, temperature 98.2 F (36.8 C), temperature source Oral, resp. rate 17, height 5\' 3"  (1.6 m), weight 124.6 kg, SpO2 98 %. Dg Chest 1 View  Result Date: 07/07/2018 CLINICAL DATA:  Fever and chills. EXAM: CHEST  1 VIEW COMPARISON:  06/29/2018 FINDINGS: The heart size and mediastinal contours are within normal limits. Both lungs are clear. New right arm PICC line is seen with tip overlying the proximal SVC. Comminuted fracture of the left humeral head and neck again seen. Right shoulder prosthesis again noted. IMPRESSION: No active disease. Right arm PICC line tip overlies the proximal SVC. Electronically Signed   By: Earle Gell M.D.   On: 07/07/2018 21:56   Recent Labs    07/06/18 0259 07/07/18 2022  WBC 5.8 5.6  HGB 9.3* 9.6*  HCT 32.2* 32.6*  PLT 230 215   Recent Labs    07/06/18 0259  NA 136  K 3.9  CL 102  CO2 26  GLUCOSE 100*  BUN 6*  CREATININE 0.95  CALCIUM 8.4*    Physical Exam: BP (!) 150/95 (BP Location: Left Arm)   Pulse 75   Temp 98.2 F (36.8 C) (Oral)   Resp 17   Ht 5\' 3"  (1.6 m)   Wt 124.6 kg   SpO2 98%   BMI 48.66 kg/m  Constitutional: NAD.  Well-developed.  Obese. HENT: Normocephalicand atraumatic.  Eyes:EOMI.  No discharge. Cardiovascular: RRR. No JVD. Respiratory:Effort normal and breath sounds normal.  GI: She exhibitsno distension.  Bowel sounds normal. Musculoskeletal: Left upper extremity with shoulder sling. Left shoulder tender to touch, stable.  Neurological: Alert and oriented. Patient is alert and oriented.  Follows full commands.   Motor: LUE is limited by pain and sling.  RUE 5/5 proximal to distal.  Left lower extremity: 4+/5 proximal distal Right lower extremity: 4/5 proximal to distal Skin: Skin iswarmand dry.  Psychiatric:Pleasant and appropriate.  Assessment/Plan: 1. Functional deficits secondary to multi-Ortho with epidural abscess which require 3+ hours per day of interdisciplinary therapy in a comprehensive inpatient rehab setting.  Physiatrist is providing close team supervision and 24 hour management of active medical problems listed below.  Physiatrist and rehab team continue to assess barriers to discharge/monitor patient progress toward functional and medical goals  Care Tool:  Bathing    Body parts bathed by patient: Front perineal area   Body parts bathed by helper: Buttocks, Right upper leg, Left upper leg, Right lower leg, Left lower leg Body parts n/a: Right arm, Left arm, Chest, Abdomen, Face   Bathing assist Assist Level: Maximal Assistance - Patient 24 - 49%     Upper Body Dressing/Undressing Upper body dressing   What is the patient wearing?: Dress    Upper body assist Assist Level: Moderate Assistance - Patient 50 - 74%    Lower Body Dressing/Undressing Lower body dressing      What is the patient wearing?: Incontinence brief     Lower body assist Assist for lower body dressing: Total Assistance - Patient < 25%     Toileting Toileting    Toileting assist Assist for toileting: Moderate Assistance - Patient 50 - 74%  Transfers Chair/bed transfer  Transfers assist  Chair/bed transfer activity did not occur: Safety/medical concerns(increase pain)  Chair/bed transfer assist level: Minimal Assistance - Patient > 75%     Locomotion Ambulation   Ambulation assist   Ambulation activity did not occur: Safety/medical concerns  Assist level: Minimal Assistance - Patient > 75% Assistive device: Hand held assist Max distance: 10 ft   Walk 10 feet  activity   Assist     Assist level: Minimal Assistance - Patient > 75% Assistive device: Hand held assist   Walk 50 feet activity   Assist Walk 50 feet with 2 turns activity did not occur: Safety/medical concerns         Walk 150 feet activity   Assist Walk 150 feet activity did not occur: Safety/medical concerns         Walk 10 feet on uneven surface  activity   Assist Walk 10 feet on uneven surfaces activity did not occur: Safety/medical concerns         Wheelchair     Assist               Wheelchair 50 feet with 2 turns activity    Assist            Wheelchair 150 feet activity     Assist            Medical Problem List and Plan: 1.Decreased functional mobilitysecondary to history of falls with left humerus fracture, T10 fracture 06/16/2018-TLSO and a right L5 pars defect/epidural abscess related to recent fall.   Cont CIR 2. DVT Prophylaxis/Anticoagulation: Subcutaneous Lovenox.   Vascular study negative for DVT 3. Pain Management:Neurontin changed to scheduled dosing, Robaxin 500 mg increased to 750 3 times daily  Lidoderm patch started on 11/18, with improvement 4. Mood:Celexa20 mg daily 5. Neuropsych: This patientiscapable of making decisions on herown behalf. 6. Skin/Wound Care:Routine skin checks 7. Fluids/Electrolytes/Nutrition:Routine in and out's 8.Recurrent epidural abscess. Continue Rocephin until 08/25/2018.  9. Hypertension. Labetalol 200 mg twice a day  Elevated this AM, otherwise relatively controlled 10.Chronic diastolic congestive heart failure. Monitor for signs of fluid overload Filed Weights   07/03/18 1718 07/08/18 0610  Weight: 125.5 kg 124.6 kg   Daily weights ordered  Stable on 11/20 11.?  CKD stage III.   Creatinine 0.95 on 11/18 12. Morbid obesity. Dietary follow-up 13.  Acute blood loss anemia  Hemoglobin 9.6 on 11/19 14. Chills  Afebrile this AM  CXR reviewed, unremarkable  for acute intracranial process  WBCs WNL  UA ordered  LOS: 5 days A FACE TO FACE EVALUATION WAS PERFORMED   [name] 07/08/2018, 7:54 AM

## 2018-07-08 NOTE — Plan of Care (Signed)
  Problem: Consults Goal: RH GENERAL PATIENT EDUCATION Description See Patient Education module for education specifics. Outcome: Progressing   Problem: RH BLADDER ELIMINATION Goal: RH STG MANAGE BLADDER WITH ASSISTANCE Description STG Manage Bladder With min Assistance  Outcome: Progressing   Problem: RH SKIN INTEGRITY Goal: RH STG MAINTAIN SKIN INTEGRITY WITH ASSISTANCE Description STG Maintain Skin Integrity With min Assistance.  Outcome: Progressing   Problem: RH SAFETY Goal: RH STG ADHERE TO SAFETY PRECAUTIONS W/ASSISTANCE/DEVICE Description STG Adhere to Safety Precautions With cues/reminders Assistance/Device.  Outcome: Progressing   Problem: RH PAIN MANAGEMENT Goal: RH STG PAIN MANAGED AT OR BELOW PT'S PAIN GOAL Description Pain at or below 5  Outcome: Progressing   Problem: RH KNOWLEDGE DEFICIT GENERAL Goal: RH STG INCREASE KNOWLEDGE OF SELF CARE AFTER HOSPITALIZATION Description Pt will be able to direct care at discharge using resources/handouts independently  Outcome: Progressing

## 2018-07-08 NOTE — Progress Notes (Signed)
Physical Therapy Session Note  Patient Details  Name: [name] MRN: [id number] Date of Birth: Jan 01, 1951  Today's Date: 07/08/2018 PT Individual Time: 0800-0855 PT Individual Time Calculation (min): 55 min   Short Term Goals: Week 1:  PT Short Term Goal 1 (Week 1): =LTGs due to ELOS  Skilled Therapeutic Interventions/Progress Updates:   Pt received supine in bed and agreeable to PT. Supine>sit transfer with min assist from PT with HOB elevated to come to EOB through long sitting technique min cues back precautions. PT assisted Pt to don TLSO and correct positioning of L UE sling with max assist.   Stand pivot transfer to [facility] with min assist using HHA from PT. Min cues for proper UE support on arm rest when preparing to sit in [place]. Pt transferred to rehab gym in [facility]. PT obtained [facility] to instruct pt in use of AD when performing transfers and gait. Prior to gait training, pt reports need for BM, so Pt returned to room in [facility].   Ambulatory transfer to toilet with SBQC x 21f and CGA progressing to min assist. Moderate cues from PT for 3 point, stet to gait pattern with QC to improve stability and  Safety with ambulation. [facility]. Pt able to position self on toilet without assist, 8 inch step placed under feet for improved stability and pain management. Sit<>stand following bowel movement with supervision assist and UE on arm rest and QC. PT [facility] care [facility]   Stand pivot transfer with CGA from PT and SBQC. Min cues for step legnth on the R. [facility] reach and all needs met.       Therapy Documentation Precautions:  Precautions Precautions: Fall, Back Precaution Comments: pt recalls 3/3 precautions  Required Braces or Orthoses: Spinal Brace, Sling Spinal Brace: Thoracolumbosacral orthotic, Applied in sitting position Restrictions Weight Bearing Restrictions: Yes LUE Weight Bearing:  Non weight bearing Pain: Pain Assessment Pain Scale: 0-10 Pain Score: 8  Pain Type: Acute pain Pain Location: Arm Pain Orientation: Left Pain Descriptors / Indicators: Aching;Constant;Discomfort;Grimacing Pain Frequency: Constant Pain Onset: On-going Patients Stated Pain Goal: 3 Pain Intervention(s): Medication (See eMAR);Emotional support Multiple Pain Sites: No    Therapy/Group: Individual Therapy  ALorie Phenix11/20/2019, 10:10 AM

## 2018-07-08 NOTE — Progress Notes (Signed)
Physical Therapy Session Note  Patient Details  Name: [name] MRN: [id number] Date of Birth: [birth year]  Today's Date: 07/08/2018 PT Individual Time: 1535-1605 PT Individual Time Calculation (min): 30 min   Short Term Goals: Week 1:  PT Short Term Goal 1 (Week 1): =LTGs due to ELOS  Skilled Therapeutic Interventions/Progress Updates:   Pt seated in recliner, with K pad on anterior aspect of R thigh.    Strengthening in sitting: 10 x 2 trunk flex/extension (small excursion) for abdominal activation; in standing 10 x 1 each heel raises, toe raises.  PT educated pt about placement of Kpad for maximal pain relief for sciatica type pain : under R buttock and thigh with pillow to hold in place, and pillow support under L elbow/forearm with UE in sling, for relief of L shoulder pain when seated, and best seat back angle when bil LEs elevated when sitting in recliner.   Pt left resting in recliner with back reclined and LEs elevated to address LE edema and low back pain. Needs at hand    Therapy Documentation Precautions:  Precautions Precautions: Fall, Back Precaution Comments: pt recalls 3/3 precautions  Required Braces or Orthoses: Spinal Brace, Sling Spinal Brace: Thoracolumbosacral orthotic, Applied in sitting position Restrictions Weight Bearing Restrictions: Yes LUE Weight Bearing: Non weight bearing  Pain: Pain Assessment  6/10 low back, bil buttocks and posteriorly down bil LEs.  premedicated     Therapy/Group: Individual Therapy  Ritchie Klee 07/08/2018, 4:11 PM

## 2018-07-09 ENCOUNTER — Inpatient Hospital Stay (HOSPITAL_COMMUNITY): Payer: Self-pay | Admitting: Physical Therapy

## 2018-07-09 ENCOUNTER — Inpatient Hospital Stay (HOSPITAL_COMMUNITY): Payer: Self-pay | Admitting: Occupational Therapy

## 2018-07-09 MED ORDER — GABAPENTIN 300 MG PO CAPS
600.0000 mg | ORAL_CAPSULE | Freq: Three times a day (TID) | ORAL | Status: DC
  Administered 2018-07-09 – 2018-07-15 (×18): 600 mg via ORAL
  Filled 2018-07-09 (×18): qty 2

## 2018-07-09 NOTE — Progress Notes (Signed)
Physical Therapy Session Note  Patient Details  Name: [name] MRN: [id number] Date of Birth: [birth year]  Today's Date: 07/09/2018 PT Individual Time: 900-930 PT Individual Time Calculation (min): 30 min   Short Term Goals: Week 1:  PT Short Term Goal 1 (Week 1): =LTGs due to ELOS  Skilled Therapeutic Interventions/Progress Updates:    Pt received seated in w/c with pain 4/10 as described below, but agreeable to PT treatment. W/c transport to rehab gym for energy/time conservation. Supervision stand pivot transfers with quad cane throughout session. Supervision sit>stands from elevated surface for LE strengthening; required sitting rest breaks after 4-5 reps; performed a total of 15 before reporting feeling dizzy and the need to lay down. MaxA sitting EOB>supine to maintain back precautions, minA and increased time for rolling L/R, and maxA x2 for supine>sitting EOB to maintain back precautions and due to decreased core strength. Returned to room via w/c transport. Pt remained seated in recliner at the end of the session with all needs in reach.  Therapy Documentation Precautions:  Precautions Precautions: Fall, Back Precaution Comments: pt recalls 3/3 precautions  Required Braces or Orthoses: Spinal Brace, Sling Spinal Brace: Thoracolumbosacral orthotic, Applied in sitting position Restrictions Weight Bearing Restrictions: Yes LUE Weight Bearing: Non weight bearing Pain: Pain Assessment Pain Scale: 0-10 Pain Score: 6  Pain Type: Acute pain Pain Location: Back Pain Orientation: Right;Lower Pain Onset: On-going Patients Stated Pain Goal: 4 Pain Intervention(s): Medication (See eMAR)    Therapy/Group: Individual Therapy  [name] 07/09/2018, 1:30 PM

## 2018-07-09 NOTE — Progress Notes (Signed)
Order for urine culture placed today. UA C&S was collected on 11/20 2103 by night shift nurse. Called micro lab to verify C&S was collected. Will process.   Erie Noe, LPN

## 2018-07-09 NOTE — Progress Notes (Signed)
Spoke with Mortimer Fries from [employer], came today and eval'd for different sling, called and said has a different sling he going to try tomorrow. Will try to come between 10-145 during break between therapy.   Lynnea Maizes Rolando Whitby, LPN  49/96/92, 4:93 PM

## 2018-07-09 NOTE — Progress Notes (Signed)
Physical Therapy Session Note  Patient Details  Name: [name] MRN: [id number] Date of Birth: 11-10-1950  Today's Date: 07/09/2018 PT Individual Time: 1615-1700 PT Individual Time Calculation (min): 45 min   Short Term Goals: Week 1:  PT Short Term Goal 1 (Week 1): =LTGs due to ELOS  Skilled Therapeutic Interventions/Progress Updates:   Pt received sitting in recliner and agreeable to PT. Stand pivot transfer to [facility] with supervision assist and QC. Min cues for AD management. Gait in day room with [facility] and close supervision assist. Min cues for decreased step length to improve stability. Standing tolerance And sit<>stand training from [facility] while engaged in Wii bowling supervision assist overall from PT with min cues for UE placement and safety. Patient returned to room and left sitting in recliner with call bell in reach and all needs met.        Therapy Documentation Precautions:  Precautions Precautions: Fall, Back Precaution Comments: pt recalls 3/3 precautions  Required Braces or Orthoses: Spinal Brace, Sling Spinal Brace: Thoracolumbosacral orthotic, Applied in sitting position Restrictions Weight Bearing Restrictions: Yes LUE Weight Bearing: Non weight bearing    Vital Signs: Therapy Vitals Temp: 98.2 F (36.8 C) Temp Source: Oral Pulse Rate: 72 Resp: 17 BP: 109/60 Patient Position (if appropriate): Sitting Oxygen Therapy SpO2: 99 % O2 Device: Room Air Pain: Pain Assessment Pain Scale: 0-10 Pain Score: 5  Pain Type: Acute pain Pain Location: Back Pain Orientation: Right;Lower Pain Descriptors / Indicators: Aching Pain Frequency: Constant Pain Onset: On-going Patients Stated Pain Goal: 4 Pain Intervention(s): Medication (See eMAR)    Therapy/Group: Individual Therapy  [name] 07/09/2018, 5:23 PM

## 2018-07-09 NOTE — Progress Notes (Signed)
Nurse called ortho regarding outside vendor sling for pt. Pt needs eval to determine appropriate sling. Ortho stated would call [employer]. Will cont to monitor. Erie Noe, LPN

## 2018-07-09 NOTE — Plan of Care (Signed)
  Problem: Consults Goal: RH GENERAL PATIENT EDUCATION Description See Patient Education module for education specifics. Outcome: Progressing   Problem: RH BLADDER ELIMINATION Goal: RH STG MANAGE BLADDER WITH ASSISTANCE Description STG Manage Bladder With min Assistance  Outcome: Progressing   Problem: RH SKIN INTEGRITY Goal: RH STG MAINTAIN SKIN INTEGRITY WITH ASSISTANCE Description STG Maintain Skin Integrity With min Assistance.  Outcome: Progressing   Problem: RH SAFETY Goal: RH STG ADHERE TO SAFETY PRECAUTIONS W/ASSISTANCE/DEVICE Description STG Adhere to Safety Precautions With cues/reminders Assistance/Device.  Outcome: Progressing   Problem: RH KNOWLEDGE DEFICIT GENERAL Goal: RH STG INCREASE KNOWLEDGE OF SELF CARE AFTER HOSPITALIZATION Description Pt will be able to direct care at discharge using resources/handouts independently  Outcome: Progressing Note:  Pt frequently at a pain level of 8-10 out of pain scale 1-10. Requiring use of prn pain medications frequently.

## 2018-07-09 NOTE — Progress Notes (Signed)
Orthopedic Tech Progress Note Patient Details:  [name] 1951/04/27 [id number]  Patient ID: [name], female   DOB: 03-25-1951, 67 y.o.   MRN: [id number]   [place] Pink 07/09/2018, 9:32 AMCalled [employer] for shoulder sling.

## 2018-07-09 NOTE — Progress Notes (Signed)
Physical Therapy Session Note  Patient Details  Name: [name] MRN: [id number] Date of Birth: Feb 04, 1951  Today's Date: 07/09/2018 PT Individual Time: 0810-0855 PT Individual Time Calculation (min): 45 min   Short Term Goals: Week 1:  PT Short Term Goal 1 (Week 1): =LTGs due to ELOS  Skilled Therapeutic Interventions/Progress Updates:   Pt in supine and agreeable to therapy, pain as detailed below. Pt w/ increased anxiety and tearfulness this morning, provided encouragement throughout session. Transferred to EOB w/ min assist and to w/c via stand pivot w/ min assist HHA x1. Total assist to don TLSO at EOB. Total assist w/c transport to therapy gym. Worked on gait w/ SBQC in gym, ambulated 20' x2 w/ CGA and verbal cues for gait pattern w/ cane. Pt reported feeling increased wooziness and fatigue w/ upright activity, suspect due to taking pain medicine right before session started. Resolves w/ rest and relaxation strategies. Deferred to w/c exercise remainder of session including LAQs, heel raises, toe raises, and adduction squeezes. Returned to room via w/c, ended session in w/c w/ all needs in reach, awaiting next therapy session. Missed combined 15 min of skilled PT at beginning of session 2/2 RN care and at end of session 2/2 fatigue.   Therapy Documentation Precautions:  Precautions Precautions: Fall, Back Precaution Comments: pt recalls 3/3 precautions  Required Braces or Orthoses: Spinal Brace, Sling Spinal Brace: Thoracolumbosacral orthotic, Applied in sitting position Restrictions Weight Bearing Restrictions: Yes LUE Weight Bearing: Non weight bearing Vital Signs:  Pain: Pain Assessment Pain Scale: 0-10 Pain Score: 7  Pain Type: Acute pain Pain Location: Back Pain Orientation: Lower;Right Pain Radiating Towards: right leg Pain Descriptors / Indicators: Aching;Moaning;Throbbing Pain Frequency: Constant Pain Onset: On-going Patients Stated Pain Goal: 4 Pain  Intervention(s): (premedicated)  Therapy/Group: Individual Therapy  Sriram Febles K Bryonna Sundby 07/09/2018, 10:02 AM

## 2018-07-09 NOTE — Progress Notes (Signed)
[facility] PHYSICAL MEDICINE & REHABILITATION PROGRESS NOTE  Subjective/Complaints: Patient seen laying in bed this morning.  She states she slept well overnight.  When informed about her discharge date she states, "I am not going home if not, not all better".  She complains of right leg pain.  ROS: + Right leg pain.  Denies fevers, chills, CP, shortness of breath, nausea, vomiting, diarrhea.  Objective: Vital Signs: Blood pressure (!) 144/74, pulse 78, temperature 98.5 F (36.9 C), temperature source Oral, resp. rate 20, height 5\' 3"  (1.6 m), weight 91.9 kg, SpO2 98 %. Dg Chest 1 View  Result Date: 07/07/2018 CLINICAL DATA:  Fever and chills. EXAM: CHEST  1 VIEW COMPARISON:  06/29/2018 FINDINGS: The heart size and mediastinal contours are within normal limits. Both lungs are clear. New right arm PICC line is seen with tip overlying the proximal SVC. Comminuted fracture of the left humeral head and neck again seen. Right shoulder prosthesis again noted. IMPRESSION: No active disease. Right arm PICC line tip overlies the proximal SVC. Electronically Signed   By: Earle Gell M.D.   On: 07/07/2018 21:56   Recent Labs    07/07/18 2022  WBC 5.6  HGB 9.6*  HCT 32.6*  PLT 215   No results for input(s): NA, K, CL, CO2, GLUCOSE, BUN, CREATININE, CALCIUM in the last 72 hours.  Physical Exam: BP (!) 144/74 (BP Location: Left Wrist)   Pulse 78   Temp 98.5 F (36.9 C) (Oral)   Resp 20   Ht 5\' 3"  (1.6 m)   Wt 91.9 kg   SpO2 98%   BMI 35.89 kg/m  Constitutional: NAD.  Well-developed.  Obese. HENT: Normocephalicand atraumatic.  Eyes:EOMI.  No discharge. Cardiovascular: RRR.  No JVD. Respiratory:Effort normal and breath sounds normal.  GI: She exhibitsno distension.  Bowel sounds normal. Musculoskeletal: Left upper extremity with shoulder sling. Left shoulder tender to touch, unchanged.  Neurological: Alert and oriented. Patient is alert and oriented.  Follows full commands.   Motor: LUE is limited by pain and sling.  RUE 5/5 proximal to distal.  Left lower extremity: 4+/5 proximal distal, unchanged Right lower extremity: 4/5 proximal to distal, unchanged Skin: Skin iswarmand dry.  Psychiatric:Pleasant and appropriate.  Assessment/Plan: 1. Functional deficits secondary to multi-Ortho with epidural abscess which require 3+ hours per day of interdisciplinary therapy in a comprehensive inpatient rehab setting.  Physiatrist is providing close team supervision and 24 hour management of active medical problems listed below.  Physiatrist and rehab team continue to assess barriers to discharge/monitor patient progress toward functional and medical goals  Care Tool:  Bathing    Body parts bathed by patient: Left arm, Chest, Abdomen, Front perineal area, Right upper leg, Left upper leg, Face   Body parts bathed by helper: Right arm, Buttocks, Right lower leg, Left lower leg Body parts n/a: Right arm, Left arm, Chest, Abdomen, Face   Bathing assist Assist Level: Moderate Assistance - Patient 50 - 74%     Upper Body Dressing/Undressing Upper body dressing   What is the patient wearing?: Dress    Upper body assist Assist Level: Moderate Assistance - Patient 50 - 74%    Lower Body Dressing/Undressing Lower body dressing      What is the patient wearing?: Incontinence brief     Lower body assist Assist for lower body dressing: Total Assistance - Patient < 25%     Toileting Toileting    Toileting assist Assist for toileting: Dependent - Patient 0%  Transfers Chair/bed transfer  Transfers assist  Chair/bed transfer activity did not occur: Safety/medical concerns(increase pain)  Chair/bed transfer assist level: Minimal Assistance - Patient > 75%     Locomotion Ambulation   Ambulation assist   Ambulation activity did not occur: Safety/medical concerns  Assist level: Contact Guard/Touching assist Assistive device: Cane-quad Max  distance: 20'   Walk 10 feet activity   Assist     Assist level: Contact Guard/Touching assist Assistive device: Cane-quad   Walk 50 feet activity   Assist Walk 50 feet with 2 turns activity did not occur: Safety/medical concerns         Walk 150 feet activity   Assist Walk 150 feet activity did not occur: Safety/medical concerns         Walk 10 feet on uneven surface  activity   Assist Walk 10 feet on uneven surfaces activity did not occur: Safety/medical concerns         Wheelchair     Assist               Wheelchair 50 feet with 2 turns activity    Assist            Wheelchair 150 feet activity     Assist            Medical Problem List and Plan: 1.Decreased functional mobilitysecondary to history of falls with left humerus fracture, T10 fracture 06/16/2018-TLSO and a right L5 pars defect/epidural abscess related to recent fall.   Cont CIR 2. DVT Prophylaxis/Anticoagulation: Subcutaneous Lovenox.   Vascular study negative for DVT 3. Pain Management:  Neurontin changed to 600 mg scheduled on 11/21  Robaxin 500 mg increased to 750 3 times daily  Lidoderm patch started on 11/18, with improvement 4. Mood:Celexa20 mg daily 5. Neuropsych: This patientiscapable of making decisions on herown behalf. 6. Skin/Wound Care:Routine skin checks 7. Fluids/Electrolytes/Nutrition:Routine in and out's 8.Recurrent epidural abscess. Continue Rocephin until 08/25/2018.  9. Hypertension. Labetalol 200 mg twice a day  Relatively controlled on 11/21 10.Chronic diastolic congestive heart failure. Monitor for signs of fluid overload Filed Weights   07/03/18 1718 07/08/18 0610 07/09/18 0600  Weight: 125.5 kg 124.6 kg 91.9 kg   Daily weights ordered  ?  Reliability on 11/21 11.?  CKD stage III.   Creatinine 0.95 on 11/18 12. Morbid obesity. Dietary follow-up 13.  Acute blood loss anemia  Hemoglobin 9.6 on 11/19 14. Chills:?   Resolved  Afebrile this AM  CXR reviewed, unremarkable for acute intracranial process  WBCs WNL  UA equivocal, urine culture ordered  LOS: 6 days A FACE TO FACE EVALUATION WAS PERFORMED  Gavriel Holzhauer [name] 07/09/2018, 10:17 AM

## 2018-07-09 NOTE — Progress Notes (Signed)
Occupational Therapy Session Note  Patient Details  Name: [name] MRN: [id number] Date of Birth: [birth year]  Today's Date: 07/09/2018 OT Individual Time: 1300-1400 OT Individual Time Calculation (min): 60 min    Short Term Goals: Week 1:     Skilled Therapeutic Interventions/Progress Updates:    Patient in recliner and states that she has a chill but is happy to wash up and change her clothes.  She has mild pain in left UE that is tolerable with position change. Functional transfers:  SPT with NBQC CG to/from recliner, w/c, bariatric commode Standing tolerance: 1-2 minutes for washing at the sink x 3 with CG ADL:  UB dressing mod A, dependent for TLSO and sling    LB dressing max A    CM/hygiene max A/dep TE with UE ranger and ergometer left UE , grasp and pinch tasks - limited by endurance Patient returned to recliner in room at close of session and states that she is feeling better.    Therapy Documentation Precautions:  Precautions Precautions: Fall, Back Precaution Comments: pt recalls 3/3 precautions  Required Braces or Orthoses: Spinal Brace, Sling Spinal Brace: Thoracolumbosacral orthotic, Applied in sitting position Restrictions Weight Bearing Restrictions: Yes LUE Weight Bearing: Non weight bearing General:   Vital Signs: Therapy Vitals Temp: 98.2 F (36.8 C) Temp Source: Oral Pulse Rate: 72 Resp: 17 BP: 109/60 Patient Position (if appropriate): Sitting Oxygen Therapy SpO2: 99 % O2 Device: Room Air Pain: Pain Assessment Pain Scale: 0-10 Pain Score: 2  Pain Type: Acute pain Pain Location: Back Pain Orientation: Right;Lower Pain Descriptors / Indicators: Aching Pain Frequency: Constant Pain Onset: On-going Patients Stated Pain Goal: 4 Pain Intervention(s): Medication (See eMAR)   Therapy/Group: Individual Therapy  Carlos Levering 07/09/2018, 3:08 PM

## 2018-07-10 ENCOUNTER — Inpatient Hospital Stay (HOSPITAL_COMMUNITY): Payer: Self-pay

## 2018-07-10 ENCOUNTER — Inpatient Hospital Stay (HOSPITAL_COMMUNITY): Payer: Self-pay | Admitting: Physical Therapy

## 2018-07-10 ENCOUNTER — Inpatient Hospital Stay (HOSPITAL_COMMUNITY): Payer: Self-pay | Admitting: Occupational Therapy

## 2018-07-10 LAB — URINE CULTURE: Culture: >=100000 — AB

## 2018-07-10 MED ORDER — HYDRALAZINE HCL 10 MG PO TABS
10.0000 mg | ORAL_TABLET | Freq: Three times a day (TID) | ORAL | Status: DC
  Administered 2018-07-10 – 2018-07-15 (×16): 10 mg via ORAL
  Filled 2018-07-10 (×16): qty 1

## 2018-07-10 MED ORDER — DICLOFENAC SODIUM 1 % TD GEL
2.0000 g | Freq: Four times a day (QID) | TRANSDERMAL | Status: DC
  Administered 2018-07-10 – 2018-07-15 (×18): 2 g via TOPICAL
  Filled 2018-07-10: qty 100

## 2018-07-10 NOTE — Progress Notes (Signed)
[facility] PHYSICAL MEDICINE & REHABILITATION PROGRESS NOTE  Subjective/Complaints: Still having some right leg pain. Otherwise doing alright this morning.   ROS: Patient denies fever, rash, sore throat, blurred vision, nausea, vomiting, diarrhea, cough, shortness of breath or chest pain,  back pain, headache, or mood change.   Objective: Vital Signs: Blood pressure (!) 98/56, pulse 75, temperature 98.6 F (37 C), temperature source Oral, resp. rate 18, height 5\' 3"  (1.6 m), weight 95.6 kg, SpO2 93 %. No results found. Recent Labs    07/07/18 2022  WBC 5.6  HGB 9.6*  HCT 32.6*  PLT 215   No results for input(s): NA, K, CL, CO2, GLUCOSE, BUN, CREATININE, CALCIUM in the last 72 hours.  Physical Exam: BP (!) 98/56 (BP Location: Left Arm)   Pulse 75   Temp 98.6 F (37 C) (Oral)   Resp 18   Ht 5\' 3"  (1.6 m)   Wt 95.6 kg   SpO2 93%   BMI 37.33 kg/m  Constitutional: No distress . Vital signs reviewed. HEENT: EOMI, oral membranes moist Neck: supple Cardiovascular: RRR without murmur. No JVD    Respiratory: CTA Bilaterally without wheezes or rales. Normal effort    GI: BS +, non-tender, non-distended  Musculoskeletal: Left upper extremity with shoulder sling. Left shoulder remains tender to touch, unchanged. Right leg somewhat sensitive also Neurological: Alert and oriented. Patient is alert and oriented.  Follows full commands.  Motor: LUE is limited by pain and sling.  RUE 5/5 proximal to distal.  Left lower extremity: 4+/5 proximal distal, stable Right lower extremity: 4/5 proximal to distal, stable Skin: Skin iswarmand dry.  Psychiatric:Pleasant and appropriate.  Assessment/Plan: 1. Functional deficits secondary to multi-Ortho with epidural abscess which require 3+ hours per day of interdisciplinary therapy in a comprehensive inpatient rehab setting.  Physiatrist is providing close team supervision and 24 hour management of active medical problems listed  below.  Physiatrist and rehab team continue to assess barriers to discharge/monitor patient progress toward functional and medical goals  Care Tool:  Bathing    Body parts bathed by patient: Left arm, Chest, Abdomen, Front perineal area, Right upper leg, Left upper leg, Face   Body parts bathed by helper: Right arm, Buttocks, Right lower leg, Left lower leg Body parts n/a: Right arm, Left arm, Chest, Abdomen, Face   Bathing assist Assist Level: Moderate Assistance - Patient 50 - 74%     Upper Body Dressing/Undressing Upper body dressing   What is the patient wearing?: Pull over shirt    Upper body assist Assist Level: Moderate Assistance - Patient 50 - 74%    Lower Body Dressing/Undressing Lower body dressing      What is the patient wearing?: Pants, Incontinence brief     Lower body assist Assist for lower body dressing: Maximal Assistance - Patient 25 - 49%     Toileting Toileting    Toileting assist Assist for toileting: Dependent - Patient 0%     Transfers Chair/bed transfer  Transfers assist  Chair/bed transfer activity did not occur: Safety/medical concerns(increase pain)  Chair/bed transfer assist level: Supervision/Verbal cueing     Locomotion Ambulation   Ambulation assist   Ambulation activity did not occur: Safety/medical concerns  Assist level: Supervision/Verbal cueing Assistive device: Walker-rolling Max distance: 30ft   Walk 10 feet activity   Assist     Assist level: Supervision/Verbal cueing Assistive device: Cane-quad   Walk 50 feet activity   Assist Walk 50 feet with 2 turns activity did not  occur: Safety/medical concerns         Walk 150 feet activity   Assist Walk 150 feet activity did not occur: Safety/medical concerns         Walk 10 feet on uneven surface  activity   Assist Walk 10 feet on uneven surfaces activity did not occur: Safety/medical concerns         Wheelchair     Assist                Wheelchair 50 feet with 2 turns activity    Assist            Wheelchair 150 feet activity     Assist            Medical Problem List and Plan: 1.Decreased functional mobilitysecondary to history of falls with left humerus fracture, T10 fracture 06/16/2018-TLSO and a right L5 pars defect/epidural abscess related to recent fall.   Cont CIR 2. DVT Prophylaxis/Anticoagulation: Subcutaneous Lovenox.   Vascular study negative for DVT 3. Pain Management:  Neurontin changed to 600 mg scheduled on 11/21--observe for effect  Robaxin 500 mg increased to 750 3 times daily  Lidoderm patch started on 11/18, with improvement 4. Mood:Celexa20 mg daily 5. Neuropsych: This patientiscapable of making decisions on herown behalf. 6. Skin/Wound Care:Routine skin checks 7. Fluids/Electrolytes/Nutrition:Routine in and out's 8.Recurrent epidural abscess. Continue Rocephin until 08/25/2018.  9. Hypertension. Labetalol 200 mg twice a day  BP's actually too soft.   -reduce hydralazine to 10mg  TID 10.Chronic diastolic congestive heart failure. Monitor for signs of fluid overload Filed Weights   07/08/18 0610 07/09/18 0600 07/10/18 0500  Weight: 124.6 kg 91.9 kg 95.6 kg   -weights inconsistent--follow for true trend 11.?  CKD stage III.   Creatinine 0.95 on 11/18 12. Morbid obesity. Dietary follow-up 13.  Acute blood loss anemia  Hemoglobin 9.6 on 11/19 14. Chills:?  Resolved  Afebrile   CXR negative for acute intracranial process  WBCs WNL  UA equivocal, urine culture PENDING  LOS: 7 days A FACE TO FACE EVALUATION WAS PERFORMED  Meredith Staggers 07/10/2018, 8:42 AM

## 2018-07-10 NOTE — Plan of Care (Signed)
  Problem: Consults Goal: RH GENERAL PATIENT EDUCATION Description See Patient Education module for education specifics. Outcome: Progressing   Problem: RH BLADDER ELIMINATION Goal: RH STG MANAGE BLADDER WITH ASSISTANCE Description STG Manage Bladder With min Assistance  Outcome: Progressing   Problem: RH SKIN INTEGRITY Goal: RH STG MAINTAIN SKIN INTEGRITY WITH ASSISTANCE Description STG Maintain Skin Integrity With min Assistance.  Outcome: Progressing   Problem: RH SAFETY Goal: RH STG ADHERE TO SAFETY PRECAUTIONS W/ASSISTANCE/DEVICE Description STG Adhere to Safety Precautions With cues/reminders Assistance/Device.  Outcome: Progressing   Problem: RH KNOWLEDGE DEFICIT GENERAL Goal: RH STG INCREASE KNOWLEDGE OF SELF CARE AFTER HOSPITALIZATION Description Pt will be able to direct care at discharge using resources/handouts independently  Outcome: Progressing

## 2018-07-10 NOTE — Progress Notes (Signed)
Occupational Therapy Session Note  Patient Details  Name: [name] MRN: [id number] Date of Birth: 01/14/1951  Today's Date: 07/10/2018 OT Individual Time: 0037-0488 OT Individual Time Calculation (min): 72 min    Short Term Goals: Week 1:  OT Short Term Goal 1 (Week 1): STGs = LTGs  Skilled Therapeutic Interventions/Progress Updates:    Pt received in bed and stated she was having increased pain in L arm and R leg. RN provided her with medication.   Pt discussed her difficulties with self cleansing post toileting at home even before this fall.  See recommendations for home below. To ensure pt could thoroughly cleanse perineal area,  HOB elevated. She did need A to cleanse area fully as she could not use her L hand to lift her panus to wash with R hand. Pt transitioned to EOB with mod A.  She could not adequately wash L arm as it was so painful to lift so needed A.  He shirt was very tight so she needed increased A to don. Total A with TLSO and arm sling.  Pt was able to rise to stand several times with close S from elevated bed and CGA from low recliner.  Pt pulled pants up with 50 % A. She was in too much pain to stand at sink to brush teeth. Pt used quad cane to transfer to recliner. Pt set up in recliner with pillow to support L arm and all needs met.   Adaptations for pt to use at home to increase ease with dressing and cleansing post toileting(urination): -use much looser sleeveless V neck tops. Can use a shawl around her shoulders to keep warm -fill spray bottle with soapy water to spray in perineal/ panus area then use second bottle with warm water - dry off using hair dryer on cool low setting Pt tends to have a BM when her caregiver is present.   Therapy Documentation Precautions:  Precautions Precautions: Fall, Back Precaution Comments: pt recalls 3/3 precautions  Required Braces or Orthoses: Spinal Brace, Sling Spinal Brace: Thoracolumbosacral orthotic, Applied in  sitting position Restrictions Weight Bearing Restrictions: Yes LUE Weight Bearing: Non weight bearing     Pain: Pain Assessment Pain Scale: 0-10 Pain Score: 6  Pain Type: Acute pain Pain Location: Ankle Pain Orientation: Posterior Pain Radiating Towards: right leg Pain Descriptors / Indicators: Aching;Throbbing Pain Frequency: Constant Pain Onset: On-going Pain Intervention(s): Medication (See eMAR) ADL: Refer to CARE tool   Therapy/Group: Individual Therapy  Daquarius Dubeau 07/10/2018, 12:09 PM

## 2018-07-10 NOTE — Progress Notes (Signed)
Occupational Therapy Session Note  Patient Details  Name: [name] MRN: [id number] Date of Birth: 1951-02-10  Today's Date: 07/10/2018 OT Individual Time: 1400-1455 OT Individual Time Calculation (min): 55 min    Short Term Goals: Week 1:  OT Short Term Goal 1 (Week 1): STGs = LTGs  Skilled Therapeutic Interventions/Progress Updates:    1:1. Pt received seated in gym EOM. Pt completes functional transfers throughout session with CGA and quad cane. Pt educated on various adaptive equipment to don stockings and medical/non medical versions of toilet aides. Pt given written information of names of items to discuss with family. Pt completes 2x20 towel slides for R shoulder ROM with 1.5# wrist weight on wrist on high low table as well as up incline against gravity: flex/ext, elbow flex/ext, int/ext rotation, horizontal ab/adduct, circles in B directions for increased strength/endurance requried for BADLS. Exited session with tp seated in recliner with call ligh t inreach and all needs met  Therapy Documentation Precautions:  Precautions Precautions: Fall, Back Precaution Comments: pt recalls 3/3 precautions  Required Braces or Orthoses: Spinal Brace, Sling Spinal Brace: Thoracolumbosacral orthotic, Applied in sitting position Restrictions Weight Bearing Restrictions: Yes LUE Weight Bearing: Non weight bearing General:   Vital Signs:  Pain: Pain Assessment Pain Scale: 0-10 Pain Score: 6  Pain Type: Acute pain Pain Location: Back Pain Orientation: Lower Pain Radiating Towards: right leg Pain Descriptors / Indicators: Aching;Throbbing;Tightness Pain Frequency: Constant Pain Onset: On-going Pain Intervention(s): Medication (See eMAR) ADL: ADL Eating: Not assessed Grooming: Not assessed Where Assessed-Upper Body Bathing: Edge of bed Lower Body Bathing: Maximal assistance Where Assessed-Lower Body Bathing: Edge of bed, Other (Comment)(sit<stand in [place]) Upper Body  Dressing: Maximal assistance Where Assessed-Upper Body Dressing: Edge of bed Lower Body Dressing: Maximal assistance Where Assessed-Lower Body Dressing: Edge of bed Toileting: Not assessed Social research officer, government: Not assessed Vision   Perception    Praxis   Exercises:   Other Treatments:     Therapy/Group: Individual Therapy  Tonny Branch 07/10/2018, 3:00 PM

## 2018-07-10 NOTE — Progress Notes (Signed)
Physical Therapy Weekly Progress Note  Patient Details  Name: [name] MRN: [id number] Date of Birth: 08-27-1950  Beginning of progress report period: July 04, 2018 End of progress report period: July 10, 2018  Today's Date: 07/10/2018 PT Individual Time: 1300-1400 PT Individual Time Calculation (min): 60 min   Short term goals not set due to estimated LOS. Pt currently requires mod/maxA for bed mobility, min guard for transfers and gait with quad cane. Limited by pain in back and L shoulder d/t fractures and difficulty following back precautions d/t limited UE use for mobility. Pt plans to use hospital bed at home to increase ease of transfers and improve tolerance to lying down.  Patient continues to demonstrate the following deficits muscle weakness, decreased cardiorespiratoy endurance and decreased standing balance, decreased postural control, decreased balance strategies and difficulty maintaining precautions and therefore will continue to benefit from skilled PT intervention to increase functional independence with mobility.  Patient progressing toward long term goals..  Continue plan of care.  PT Short Term Goals Week 1:  PT Short Term Goal 1 (Week 1): =LTGs due to ELOS Week 2:  PT Short Term Goal 1 (Week 2): =LTG due to estimated LOS  Skilled Therapeutic Interventions/Progress Updates: Pt received seated in recliner, c/o pain as below and agreeable to treatment. Pt noted to have been incontinent of urine in pants; pt unaware of incontinence and unable to feel that she was wet. Transferred recliner <>BSC with quad cane and min guard; totalA hygiene and clothing management. Transported to gym [place] in w/c. Stand pivot min guard with quad cane. Standing forward/backward toe taps to numbered targets and 3" step with quad cane RUE support and min guard for focus on hip abduction strength, dynamic standing and single limb stance balance. Seated long arc quads 2x20 reps, hip  adduction isometric 2x20 reps. Provided foam block for L grip strengthening; educated on grip strengthening implications on shoulder strength to improve function once out of sling. Alerted RN and PA to new onset incontinence. Handoff to OT at end of session.      Therapy Documentation Precautions:  Precautions Precautions: Fall, Back Precaution Comments: pt recalls 3/3 precautions  Required Braces or Orthoses: Spinal Brace, Sling Spinal Brace: Thoracolumbosacral orthotic, Applied in sitting position Restrictions Weight Bearing Restrictions: Yes LUE Weight Bearing: Non weight bearing Pain: Pain Assessment Pain Scale: 0-10 Pain Score: 6  Pain Type: Acute pain Pain Location: Back Pain Orientation: Lower Pain Radiating Towards: right leg Pain Descriptors / Indicators: Aching;Throbbing;Tightness Pain Frequency: Constant Pain Onset: On-going Pain Intervention(s): Medication (See eMAR)   Therapy/Group: Individual Therapy  Corliss Skains 07/10/2018, 7:23 AM

## 2018-07-11 ENCOUNTER — Inpatient Hospital Stay (HOSPITAL_COMMUNITY): Payer: Self-pay | Admitting: Occupational Therapy

## 2018-07-11 ENCOUNTER — Inpatient Hospital Stay (HOSPITAL_COMMUNITY): Payer: Self-pay | Admitting: Physical Therapy

## 2018-07-11 NOTE — Progress Notes (Addendum)
[name] is a 67 y.o. female [birth year] [id number]  Subjective: No new complaints. No new problems. Slept well. Feeling OK.  Objective: Vital signs in last 24 hours: Temp:  [98.5 F (36.9 C)-99 F (37.2 C)] 98.5 F (36.9 C) (11/23 0443) Pulse Rate:  [69-79] 79 (11/23 0443) Resp:  [18-19] 19 (11/23 0443) BP: (109-134)/(64-69) 134/68 (11/23 0443) SpO2:  [96 %-98 %] 98 % (11/23 0443) Weight:  [124.8 kg] 124.8 kg (11/23 0443) Weight change: 29.2 kg Last BM Date: 07/08/18  Intake/Output from previous day: 11/22 0701 - 11/23 0700 In: 54 [P.O.:960] Out: -  Last cbgs: CBG (last 3)  No results for input(s): GLUCAP in the last 72 hours.   Physical Exam General: No apparent distress. Obese   HEENT: not dry Lungs: Normal effort. Lungs clear to auscultation, no crackles or wheezes. Cardiovascular: Regular rate and rhythm, no edema Abdomen: S/NT/ND; BS(+) Musculoskeletal:  unchanged Neurological: No new neurological deficits Wounds: N/A    Skin: clear  Aging changes Mental state: Alert, oriented, cooperative Obese   Lab Results: BMET    Component Value Date/Time   NA 136 07/06/2018 0259   K 3.9 07/06/2018 0259   CL 102 07/06/2018 0259   CO2 26 07/06/2018 0259   GLUCOSE 100 (H) 07/06/2018 0259   BUN 6 (L) 07/06/2018 0259   CREATININE 0.95 07/06/2018 0259   CREATININE 1.00 (H) 09/01/2017 1621   CALCIUM 8.4 (L) 07/06/2018 0259   GFRNONAA 60 (L) 07/06/2018 0259   GFRNONAA 58 (L) 09/01/2017 1621   GFRAA >60 07/06/2018 0259   GFRAA 67 09/01/2017 1621   CBC    Component Value Date/Time   WBC 5.6 07/07/2018 2022   RBC 3.76 (L) 07/07/2018 2022   HGB 9.6 (L) 07/07/2018 2022   HCT 32.6 (L) 07/07/2018 2022   PLT 215 07/07/2018 2022   MCV 86.7 07/07/2018 2022   MCH 25.5 (L) 07/07/2018 2022   MCHC 29.4 (L) 07/07/2018 2022   RDW 19.7 (H) 07/07/2018 2022   LYMPHSABS 1.8 07/06/2018 0259   MONOABS 2.0 (H) 07/06/2018 0259   EOSABS 0.2 07/06/2018 0259   BASOSABS 0.0  07/06/2018 0259    Studies/Results: No results found.  Medications: I have reviewed the patient's current medications.  Assessment/Plan:   1. 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 No more fever and clinically stable. Seen by neurosurgery and no plan for surgical intervention, seen by infectious disease and plan is to continue with IV ceftriaxone 2gm for 8 weeks through 08/25/18,with ESR, CRP every 2 weeks. S/P PICC line  History of falls.  CIR 2.  DVT prophylaxis with Lovenox 3.  Pain management with gabapentin, Robaxin and Lidoderm patches 4.  Depressed mood.  Celexa 20 mg daily 5.  Hypertension -- continue with labetalol 200 mg twice a day 6.  Congestive heart failure, diastolic.  Monitoring weight 7.  Chronic kidney disease stage III.  Migraine creatinine 8.  Morbid obesity.  Dietary follow-up. 9.  Chills-monitoring temperature        Length of stay, days: 8  Walker Kehr , MD 07/11/2018, 11:42 AM

## 2018-07-11 NOTE — Progress Notes (Addendum)
Physical Therapy Session Note  Patient Details  Name: [name] MRN: [id number] Date of Birth: 07/25/1951  Today's Date: 07/11/2018 PT Individual Time: 1403-1430 PT Individual Time Calculation (min): 27 min   Short Term Goals: Week 2:  PT Short Term Goal 1 (Week 2): =LTG due to estimated LOS  Skilled Therapeutic Interventions/Progress Updates:  Pt received in recliner & agreeable to tx, reporting 6/10 RLE pain but states she's premedicated. Pt completed sit<>stand with CGA and stand pivot to w/c CGA without AD. Transported pt to gym via w/c dependent assist for time management. Pt ambulates 37 ft + 37 ft with small based quad cane and CGA with decreased step and stride length. At end of session pt returned to recliner in same manner noted above. Pt left in recliner with BLE elevated & call bell in lap.   Pt requires seated rest breaks between ambulation trials 2/2 fatigue.  Therapy Documentation Precautions:  Precautions Precautions: Fall, Back Precaution Comments: pt recalls 3/3 precautions  Required Braces or Orthoses: Spinal Brace, Sling Spinal Brace: Thoracolumbosacral orthotic, Applied in sitting position Restrictions Weight Bearing Restrictions: Yes LUE Weight Bearing: Non weight bearing    Therapy/Group: Individual Therapy  [name] 07/11/2018, 2:32 PM

## 2018-07-11 NOTE — Progress Notes (Signed)
Occupational Therapy Session Note  Patient Details  Name: [name] MRN: [id number] Date of Birth: 10-18-1950  Today's Date: 07/11/2018 OT Individual Time: 9678-9381 OT Individual Time Calculation (min): 26 min    Short Term Goals: Week 1:  OT Short Term Goal 1 (Week 1): STGs = LTGs  Skilled Therapeutic Interventions/Progress Updates:    Limited OT session this am.  Pt transferred from supine to sit EOB with some complaints of RLE pain in supine.  Max assist for transition to sitting with supervision for sitting balance.  She was able to use the RUE to wash her face, but as she sat she reported increased pain in the right hip all the way down into the foot.  After a couple of mins she was unable to tolerate sitting up and requested to lay back down.  Max assist needed for transition to supine as well.  Pt with no pain relief with change of position.  Therapist did assist with washing her LEs in supine to see if it would help as well as positioning the RLE on pillow.  Nursing made aware of request for pain medication, even though she had just had some earlier in the am.  Pt declining further OT secondary to having intolerable pain in the RLE.  Call button and phone left in reach.   Therapy Documentation Precautions:  Precautions Precautions: Fall, Back Precaution Comments: pt recalls 3/3 precautions  Required Braces or Orthoses: Spinal Brace, Sling Spinal Brace: Thoracolumbosacral orthotic, Applied in sitting position Restrictions Weight Bearing Restrictions: Yes LUE Weight Bearing: Non weight bearing General: General OT Amount of Missed Time: 15 Minutes   Pain: Pain Assessment Pain Scale: 0-10 Pain Score: 6  Pain Type: Neuropathic pain Pain Location: Back Pain Orientation: Lower Pain Radiating Towards: R leg Pain Descriptors / Indicators: Burning Pain Frequency: Constant Pain Onset: On-going Pain Intervention(s): Medication (See eMAR) ADL: ADL Eating: Not  assessed Grooming: Not assessed Where Assessed-Upper Body Bathing: Edge of bed Lower Body Bathing: Maximal assistance Where Assessed-Lower Body Bathing: Edge of bed, Other (Comment)(sit<stand in [place]) Upper Body Dressing: Maximal assistance Where Assessed-Upper Body Dressing: Edge of bed Lower Body Dressing: Maximal assistance Where Assessed-Lower Body Dressing: Edge of bed Toileting: Not assessed Gaffer Transfer: Not assessed   Therapy/Group: Individual Therapy  Catalia Massett OTR/L 07/11/2018, 12:17 PM

## 2018-07-11 NOTE — Progress Notes (Signed)
Physical Therapy Session Note  Patient Details  Name: [name] MRN: [id number] Date of Birth: 07-Feb-1951  Today's Date: 07/11/2018 PT Individual Time: 1600-1610 PT Individual Time Calculation (min): 10 min   Short Term Goals: Week 1:  PT Short Term Goal 1 (Week 1): =LTGs due to ELOS  Skilled Therapeutic Interventions/Progress Updates:   Pt in recliner and agreeable to make-up session from missed time in AM 2/2 pain. Denies pain this afternoon, ongoing soreness in RLE. Ambulated 30' w/ increased time using SBQC on R. Verbal and visual cues for gait pattern, CGA for safety but no overt LOB. Tactile cues for pelvic stabilization in single leg stance. Returned to recliner and ended session in recliner, all needs in reach.   Therapy Documentation Precautions:  Precautions Precautions: Fall, Back Precaution Comments: pt recalls 3/3 precautions  Required Braces or Orthoses: Spinal Brace, Sling Spinal Brace: Thoracolumbosacral orthotic, Applied in sitting position Restrictions Weight Bearing Restrictions: Yes LUE Weight Bearing: Non weight bearing Vital Signs: Therapy Vitals Temp: 98.6 F (37 C) Pulse Rate: 74 Resp: 20 BP: (!) 107/51 Patient Position (if appropriate): Sitting Oxygen Therapy SpO2: 98 % O2 Device: Room Air Pain: Pain Assessment Pain Scale: 0-10 Pain Score: 6  Pain Type: Neuropathic pain Pain Location: Back Pain Orientation: Lower Pain Radiating Towards: down right leg and into foot Pain Descriptors / Indicators: Aching;Burning;Tingling;Numbness Pain Frequency: Constant Pain Onset: On-going Pain Intervention(s): Medication (See eMAR);Repositioned  Therapy/Group: Individual Therapy  Reynaldo Rossman Clent Demark 07/11/2018, 4:26 PM

## 2018-07-12 ENCOUNTER — Inpatient Hospital Stay (HOSPITAL_COMMUNITY): Payer: Self-pay | Admitting: Occupational Therapy

## 2018-07-12 MED ORDER — OXYCODONE HCL 5 MG PO TABS
10.0000 mg | ORAL_TABLET | ORAL | Status: DC | PRN
  Administered 2018-07-12 – 2018-07-14 (×8): 15 mg via ORAL
  Filled 2018-07-12 (×8): qty 3

## 2018-07-12 MED ORDER — ENOXAPARIN SODIUM 60 MG/0.6ML ~~LOC~~ SOLN
60.0000 mg | Freq: Every day | SUBCUTANEOUS | Status: DC
  Administered 2018-07-13 – 2018-07-15 (×3): 60 mg via SUBCUTANEOUS
  Filled 2018-07-12 (×3): qty 0.6

## 2018-07-12 NOTE — Progress Notes (Signed)
Occupational Therapy Session Note  Patient Details  Name: [name] MRN: [id number] Date of Birth: [birth year]  Today's Date: 07/12/2018 OT Group Time: 1100-1200 OT Group Time Calculation (min): 60 min  Skilled Therapeutic Interventions/Progress Updates:    Pt engaged in therapeutic w/c level dance group focusing on patient choice, UE/LE strengthening, salience, activity tolerance, and social participation. Pt was guided through various dance-based exercises involving UEs/LEs and trunk. All music was selected by group members. Emphasis placed on endurance and general strengthening. Pt participated with encouragement. She declined standing due to LE soreness. She conversed with members on her left and right sides, and also requested songs. At end of session pt was taken back to room by NT.   Therapy Documentation Precautions:  Precautions Precautions: Fall, Back Precaution Comments: pt recalls 3/3 precautions  Required Braces or Orthoses: Spinal Brace, Sling Spinal Brace: Thoracolumbosacral orthotic, Applied in sitting position Restrictions Weight Bearing Restrictions: Yes LUE Weight Bearing: Non weight bearing Pain: Pain Assessment Pain Scale: 0-10 Pain Score: 6  Pain Type: Acute pain Pain Location: Leg Pain Orientation: Right Pain Radiating Towards: right foot Pain Descriptors / Indicators: Aching;Discomfort;Dull;Tingling;Numbness Pain Frequency: Constant Pain Onset: On-going Pain Intervention(s): Medication (See eMAR) ADL: ADL Eating: Not assessed Grooming: Not assessed Where Assessed-Upper Body Bathing: Edge of bed Lower Body Bathing: Maximal assistance Where Assessed-Lower Body Bathing: Edge of bed, Other (Comment)(sit<stand in [place]) Upper Body Dressing: Maximal assistance Where Assessed-Upper Body Dressing: Edge of bed Lower Body Dressing: Maximal assistance Where Assessed-Lower Body Dressing: Edge of bed Toileting: Not assessed Gaffer Transfer: Not  assessed      Therapy/Group: Group Therapy  Gordie Crumby A Lukisha Procida 07/12/2018, 12:45 PM

## 2018-07-12 NOTE — Progress Notes (Signed)
[name] is a 67 y.o. female [birth year] [id number]  Subjective: The patient is complaining of pain in the right foot right leg and right back.  She rates her pain at 8 out of 10-10 out of 10 at times.  She has to take 10 mg of oxycodone that would relieve pain for 2 hours only per nursing staff the pain is not controlled with other additional meds.  Patient is complaining of numbness and on the top of the right foot which is new  Objective: Vital signs in last 24 hours: Temp:  [97.7 F (36.5 C)-98.8 F (37.1 C)] 97.7 F (36.5 C) (11/24 0457) Pulse Rate:  [67-74] 67 (11/24 0457) Resp:  [18-20] 18 (11/24 0457) BP: (107-117)/(51-60) 117/60 (11/24 0457) SpO2:  [98 %] 98 % (11/24 0457) Weight:  [124.2 kg] 124.2 kg (11/24 0457) Weight change: -0.6 kg Last BM Date: 07/11/18  Intake/Output from previous day: 11/23 0701 - 11/24 0700 In: 850 [P.O.:840; I.V.:10] Out: -  Last cbgs: CBG (last 3)  No results for input(s): GLUCAP in the last 72 hours.   Physical Exam General: No apparent distress. .Obese HEENT: not dry Lungs: Normal effort. Lungs clear to auscultation, no crackles or wheezes. Cardiovascular: Regular rate and rhythm, no edema Abdomen: S/NT/ND; BS(+) Musculoskeletal:  unchanged Neurological: No new neurological deficits.  There is a 10 x 5 cm area of decreased sensation on the dorsal right foot.  Straight leg elevation is +/- on the R    Skin: clear   Mental state: Alert, oriented, cooperative    Lab Results: BMET    Component Value Date/Time   NA 136 07/06/2018 0259   K 3.9 07/06/2018 0259   CL 102 07/06/2018 0259   CO2 26 07/06/2018 0259   GLUCOSE 100 (H) 07/06/2018 0259   BUN 6 (L) 07/06/2018 0259   CREATININE 0.95 07/06/2018 0259   CREATININE 1.00 (H) 09/01/2017 1621   CALCIUM 8.4 (L) 07/06/2018 0259   GFRNONAA 60 (L) 07/06/2018 0259   GFRNONAA 58 (L) 09/01/2017 1621   GFRAA >60 07/06/2018 0259   GFRAA 67 09/01/2017 1621   CBC    Component  Value Date/Time   WBC 5.6 07/07/2018 2022   RBC 3.76 (L) 07/07/2018 2022   HGB 9.6 (L) 07/07/2018 2022   HCT 32.6 (L) 07/07/2018 2022   PLT 215 07/07/2018 2022   MCV 86.7 07/07/2018 2022   MCH 25.5 (L) 07/07/2018 2022   MCHC 29.4 (L) 07/07/2018 2022   RDW 19.7 (H) 07/07/2018 2022   LYMPHSABS 1.8 07/06/2018 0259   MONOABS 2.0 (H) 07/06/2018 0259   EOSABS 0.2 07/06/2018 0259   BASOSABS 0.0 07/06/2018 0259    Studies/Results: No results found.  Medications: I have reviewed the patient's current medications.  Assessment/Plan:   1.  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 No more fever and clinically stable. Seen by neurosurgery and no plan for surgical intervention, seen by infectious disease and plan is to continue with IV ceftriaxone 2gm for 8 weeks through 08/25/18,with ESR, CRP every 2 weeks. S/P PICC line.  History of falls.  CIR Worsening pain in the right leg.  Will increase oxycodone to 15 mg every 4 hours as needed.  May need to consider long-acting Oxycodon.  May need to repeat spinal imaging if not better. 2.  DVT prophylaxis with Lovenox 3.  Pain management with gabapentin, Robaxin and Lidoderm patches 4.  Depressed mood.  Celexa 20 mg daily 5.  Hypertension -- continue with labetalol 200 mg twice a day 6.  Congestive heart failure, diastolic.  Monitoring weight 7.  Chronic kidney disease stage III.  Migraine creatinine 8.  Morbid obesity.  Dietary follow-up. 9.  Chills-monitoring temperature     Length of stay, days: 9  Walker Kehr , MD 07/12/2018, 12:00 PM

## 2018-07-13 ENCOUNTER — Inpatient Hospital Stay (HOSPITAL_COMMUNITY): Payer: Medicare Other

## 2018-07-13 ENCOUNTER — Inpatient Hospital Stay (HOSPITAL_COMMUNITY): Payer: Self-pay | Admitting: Occupational Therapy

## 2018-07-13 ENCOUNTER — Inpatient Hospital Stay (HOSPITAL_COMMUNITY): Payer: Self-pay | Admitting: Physical Therapy

## 2018-07-13 DIAGNOSIS — R0989 Other specified symptoms and signs involving the circulatory and respiratory systems: Secondary | ICD-10-CM

## 2018-07-13 NOTE — Progress Notes (Signed)
Occupational Therapy Session Note  Patient Details  Name: [name] DOBIES MRN: [id number] Date of Birth: 1950/08/20  Today's Date: 07/13/2018 OT Individual Time: 2336-1224 OT Individual Time Calculation (min): 18 min   Skilled Therapeutic Interventions/Progress Updates:    Pt greeted with NT, having BM on bedpan. Mod A for rolling Rt for NT to complete perihygiene. Afterwards pt requested for setup to wash face and hands. OT also readjusted Lt arm sling due to misalignment. Due to high feelings of pain, provided pt with essential oil blend to promote relaxation and feelings of calmness (with her consent). Guided her through deep breathing exercises for pain mgt while wafting blend. Transporter then arrived to take pt to x-ray. 27 minutes missed.     Therapy Documentation Precautions:  Precautions Precautions: Fall, Back Precaution Comments: pt recalls 3/3 precautions  Required Braces or Orthoses: Spinal Brace, Sling Spinal Brace: Thoracolumbosacral orthotic, Applied in sitting position Restrictions Weight Bearing Restrictions: Yes LUE Weight Bearing: Non weight bearing Vital Signs: Therapy Vitals Pulse Rate: 75 BP: (!) 158/92 Pain: In back. Per pt, RN already aware  Pain Assessment Pain Score: 10-Worst pain ever Pain Type: Acute pain Pain Location: Leg Pain Orientation: Right Pain Descriptors / Indicators: Burning;Aching Pain Frequency: Constant Pain Onset: On-going Pain Intervention(s): Medication (See eMAR) ADL: ADL Eating: Not assessed Grooming: Not assessed Where Assessed-Upper Body Bathing: Edge of bed Lower Body Bathing: Maximal assistance Where Assessed-Lower Body Bathing: Edge of bed, Other (Comment)(sit<stand in ) Upper Body Dressing: Maximal assistance Where Assessed-Upper Body Dressing: Edge of bed Lower Body Dressing: Maximal assistance Where Assessed-Lower Body Dressing: Edge of bed Toileting: Not assessed Gaffer Transfer: Not assessed       Therapy/Group: Individual Therapy  Kalena Mander A Lillionna Nabi 07/13/2018, 9:47 AM

## 2018-07-13 NOTE — Plan of Care (Signed)
  Problem: RH Bed to Chair Transfers Goal: LTG Patient will perform bed/chair transfers w/assist (PT) Description LTG: Patient will perform bed to chair transfers with assistance (PT). Flowsheets (Taken 07/13/2018 0725) LTG: Pt will perform Bed to Chair Transfers with assistance level  : Moderate Assistance - Patient 50 - 74% (with hospital bed; downgraded d/t slow progress) Note:  Downgraded d/t slow progress   Problem: RH Stairs Goal: LTG Patient will ambulate up and down stairs w/assist (PT) Description LTG: Patient will ambulate up and down # of stairs with assistance (PT) Outcome: Not Applicable Flowsheets (Taken 07/13/2018 0725) LTG: Pt will ambulate up/down stairs assist needed:: -- (d/c goal d/t slow progress) Note:  D/c goal d/t slow progress

## 2018-07-13 NOTE — Progress Notes (Signed)
Physical Therapy Session Note  Patient Details  Name: [name] MRN: [id number] Date of Birth: April 19, 1951  Today's Date: 07/13/2018 PT Individual Time:800-845 and 1400-1500   Total Time: 105 mins  Short Term Goals: Week 2:  PT Short Term Goal 1 (Week 2): =LTG due to estimated LOS  Skilled Therapeutic Interventions/Progress Updates:    Session 1: Pt received laying in bed. 10/10 numbness and shooting pain as described below in RLE. ModA supine>sitting EOB. Assist provided for donning TED hose and socks. Nurse arrived to administer pain meds and check vitals. Pt became tearful and crying in pain; reports the need to lay back down. ModA sitting EOB>supine for pain relief. Pt reports not wanting to be left alone; apologetic for missing therapy session. Attempted to adjust L arm sling as it was donned incorrectly, but difficult to do as pt was supine. Provided emotional support, deep breathing technique, and conversation to redirect focus for pain management. Pt remained supine in bed at the end of the session with all needs in reach. Missed 15 minutes due to pain.  Session 2: Pt received sitting EOB with handoff from OT. Pain 6/10, agreeable to treatment. HHA stand pivot transfers throughout session. Nu step 3 x 5 min increments for endurance; resistance progressed from 3>5>7 for each set. CGA gait x 25' with [facility] for endurance. W/c transport for return to room for energy/time conservation and pain management. Sitting EOB>supine with supervision. Hooklying clamshells with orange theraband 2x15 for hip strengthening. ModA supine>sitting EOB to doff TLSO and for nurse to check BP. Pt remained sitting EOB at the end of the session in nursing care.   Pictures taken of arm sling on pt's phone with her permission for visual of how it should be donned.  Therapy Documentation Precautions:  Precautions Precautions: Fall, Back Precaution Comments: pt recalls 3/3 precautions  Required Braces or  Orthoses: Spinal Brace, Sling Spinal Brace: Thoracolumbosacral orthotic, Applied in sitting position Restrictions Weight Bearing Restrictions: Yes LUE Weight Bearing: Non weight bearing Vital Signs: Therapy Vitals Temp: 97.9 F (36.6 C) Temp Source: Oral Pulse Rate: 75 Resp: 20 BP: (!) 158/92 Patient Position (if appropriate): Lying Oxygen Therapy SpO2: 100 % O2 Device: Room Air Pain: Pain Assessment Pain Score: 10-Worst pain ever Pain Type: Acute pain Pain Location: Leg Pain Orientation: Right Pain Descriptors / Indicators: Burning;Aching Pain Frequency: Constant Pain Onset: On-going Pain Intervention(s): Medication (See eMAR)    Therapy/Group: Individual Therapy  [place] Yechiel Erny 07/13/2018, 8:40 AM

## 2018-07-13 NOTE — Progress Notes (Signed)
PHYSICAL MEDICINE & REHABILITATION PROGRESS NOTE  Subjective/Complaints: Patient seen laying in bed this morning.  She is moaning in pain.  She is not able to participate in therapies this morning.  She complains of right lower extremity pain which started this morning she states when she tried to put weight on her leg.  ROS: Denies CP, SOB, N/V/D  Objective: Vital Signs: Blood pressure (!) 158/92, pulse 75, temperature 97.9 F (36.6 C), temperature source Oral, resp. rate 20, height 5\' 3"  (1.6 m), weight 125.8 kg, SpO2 100 %. No results found. No results for input(s): WBC, HGB, HCT, PLT in the last 72 hours. No results for input(s): NA, K, CL, CO2, GLUCOSE, BUN, CREATININE, CALCIUM in the last 72 hours.  Physical Exam: BP (!) 158/92   Pulse 75   Temp 97.9 F (36.6 C) (Oral)   Resp 20   Ht 5\' 3"  (1.6 m)   Wt 125.8 kg   SpO2 100%   BMI 49.13 kg/m  Constitutional: + Distressed.  Vital signs reviewed. HENT: Normocephalic.  Atraumatic. Eyes: EOMI. No discharge. Cardiovascular: RRR. No JVD. Respiratory: CTA Bilaterally. Normal effort. GI: BS +. Non-distended. Musc: No edema or tenderness in extremities. Musculoskeletal: Left upper extremity with shoulder sling.  Left shoulder TTP Neurological: Alert and oriented. Follows full commands.  Motor: LUE is limited by pain and sling.  RUE 5/5 proximal to distal.  Left lower extremity: 4+/5 proximal distal, stable Right lower extremity: 4-/5 proximal to distal (some pain inhibition) Skin: Skin iswarmand dry.  Psychiatric:Uncomfortable due to pain  Assessment/Plan: 1. Functional deficits secondary to multi-Ortho with epidural abscess which require 3+ hours per day of interdisciplinary therapy in a comprehensive inpatient rehab setting.  Physiatrist is providing close team supervision and 24 hour management of active medical problems listed below.  Physiatrist and rehab team continue to assess barriers to  discharge/monitor patient progress toward functional and medical goals  Care Tool:  Bathing    Body parts bathed by patient: Face   Body parts bathed by helper: Right lower leg, Left lower leg Body parts n/a: Right arm, Left arm, Chest, Front perineal area, Buttocks, Right upper leg, Left upper leg(Did not attempt secondary to pain and needing to lay back down)   Bathing assist Assist Level: Moderate Assistance - Patient 50 - 74%     Upper Body Dressing/Undressing Upper body dressing   What is the patient wearing?: Pull over shirt    Upper body assist Assist Level: Maximal Assistance - Patient 25 - 49%(shirt fit tightly)    Lower Body Dressing/Undressing Lower body dressing      What is the patient wearing?: Pants     Lower body assist Assist for lower body dressing: Maximal Assistance - Patient 25 - 49%     Toileting Toileting    Toileting assist Assist for toileting: Dependent - Patient 0%     Transfers Chair/bed transfer  Transfers assist  Chair/bed transfer activity did not occur: Safety/medical concerns(increase pain)  Chair/bed transfer assist level: Contact Guard/Touching assist(recliner<>w/c)     Locomotion Ambulation   Ambulation assist   Ambulation activity did not occur: Safety/medical concerns  Assist level: Contact Guard/Touching assist Assistive device: Cane-quad Max distance: 37 ft   Walk 10 feet activity   Assist     Assist level: Contact Guard/Touching assist Assistive device: Cane-quad   Walk 50 feet activity   Assist Walk 50 feet with 2 turns activity did not occur: Safety/medical concerns  Walk 150 feet activity   Assist Walk 150 feet activity did not occur: Safety/medical concerns         Walk 10 feet on uneven surface  activity   Assist Walk 10 feet on uneven surfaces activity did not occur: Safety/medical concerns         Wheelchair     Assist               Wheelchair 50 feet with  2 turns activity    Assist            Wheelchair 150 feet activity     Assist            Medical Problem List and Plan: 1.Decreased functional mobilitysecondary to history of falls with left humerus fracture, T10 fracture 06/16/2018-TLSO and a right L5 pars defect/epidural abscess related to recent fall.   Cont CIR  Weekend notes reviewed-oxycodone increased  Repeat thoracic and lumbar images ordered today, will consider MRI if necessary 2. DVT Prophylaxis/Anticoagulation: Subcutaneous Lovenox.   Vascular study negative for DVT 3. Pain Management:  Neurontin changed to 600 mg scheduled on 11/21  Robaxin 500 mg increased to 750 3 times daily  Lidoderm patch started on 11/18, with improvement  Oxycodone increased, will wean when appropriate 4. Mood:Celexa20 mg daily 5. Neuropsych: This patientiscapable of making decisions on herown behalf. 6. Skin/Wound Care:Routine skin checks 7. Fluids/Electrolytes/Nutrition:Routine in and out's 8.Recurrent epidural abscess. Continue Rocephin until 08/25/2018.  9. Hypertension. Labetalol 200 mg twice a day  Reduced hydralazine to 10mg  TID  Labile on 11/25 10.Chronic diastolic congestive heart failure. Monitor for signs of fluid overload Filed Weights   07/11/18 0443 07/12/18 0457 07/13/18 0527  Weight: 124.8 kg 124.2 kg 125.8 kg   -weights inconsistent--follow for true trend 11.?  CKD stage III.   Creatinine 0.95 on 11/18  Labs ordered for tomorrow 12. Morbid obesity. Dietary follow-up 13.  Acute blood loss anemia  Hemoglobin 9.6 on 11/19  Labs ordered for tomorrow 14. Chills:?  Resolved  Afebrile   CXR negative for acute intracranial process  WBCs WNL  UA equivocal, urine culture multiple species, reordered  LOS: 10 days A FACE TO FACE EVALUATION WAS PERFORMED   [name] 07/13/2018, 9:32 AM

## 2018-07-13 NOTE — Progress Notes (Signed)
Occupational Therapy Session Note  Patient Details  Name: [name] MRN: [id number] Date of Birth: 03-13-1951  Today's Date: 07/13/2018 OT Individual Time: 1330-1407 OT Individual Time Calculation (min): 37 min    Short Term Goals: Week 1:  OT Short Term Goal 1 (Week 1): STGs = LTGs  Skilled Therapeutic Interventions/Progress Updates:    Pt with pain reported at 6/10 in the right leg to start session.  She agreed to try and sit up from supine to EOB and see how the leg would feel.  Mod assist for supine to sit on the left side of the bed.  Once up her pain remained tolerable.  Therapist worked on donning sling over the LUE as well as TLSO.  She was able to tolerate sitting for 10 mins throughout process.  Pt in for next session before having pt transfer over to the recliner.    Therapy Documentation Precautions:  Precautions Precautions: Fall, Back Precaution Booklet Issued: No Precaution Comments: pt recalls 3/3 precautions  Required Braces or Orthoses: Spinal Brace, Sling Spinal Brace: Thoracolumbosacral orthotic, Applied in sitting position Restrictions Weight Bearing Restrictions: Yes LUE Weight Bearing: Non weight bearing  Pain: Pain Assessment Pain Scale: 0-10 Pain Score: 6  Pain Type: Acute pain Pain Location: Leg Pain Orientation: Right Pain Descriptors / Indicators: Discomfort Pain Frequency: Constant Pain Onset: On-going Pain Intervention(s): Repositioned;Emotional support   Therapy/Group: Individual Therapy  Emmajean Ratledge OTR/L 07/13/2018, 3:56 PM

## 2018-07-14 ENCOUNTER — Inpatient Hospital Stay (HOSPITAL_COMMUNITY): Payer: Self-pay | Admitting: Physical Therapy

## 2018-07-14 ENCOUNTER — Inpatient Hospital Stay (HOSPITAL_COMMUNITY): Payer: Self-pay | Admitting: Occupational Therapy

## 2018-07-14 ENCOUNTER — Inpatient Hospital Stay (HOSPITAL_COMMUNITY): Payer: Medicare Other

## 2018-07-14 DIAGNOSIS — N182 Chronic kidney disease, stage 2 (mild): Secondary | ICD-10-CM

## 2018-07-14 DIAGNOSIS — R52 Pain, unspecified: Secondary | ICD-10-CM

## 2018-07-14 LAB — CBC WITH DIFFERENTIAL/PLATELET
ABS IMMATURE GRANULOCYTES: 0.06 10*3/uL (ref 0.00–0.07)
Basophils Absolute: 0 10*3/uL (ref 0.0–0.1)
Basophils Relative: 0 %
EOS ABS: 0.4 10*3/uL (ref 0.0–0.5)
Eosinophils Relative: 8 %
HEMATOCRIT: 32.9 % — AB (ref 36.0–46.0)
HEMOGLOBIN: 9.2 g/dL — AB (ref 12.0–15.0)
IMMATURE GRANULOCYTES: 1 %
LYMPHS ABS: 1.6 10*3/uL (ref 0.7–4.0)
LYMPHS PCT: 32 %
MCH: 25 pg — AB (ref 26.0–34.0)
MCHC: 28 g/dL — ABNORMAL LOW (ref 30.0–36.0)
MCV: 89.4 fL (ref 80.0–100.0)
Monocytes Absolute: 1.4 10*3/uL — ABNORMAL HIGH (ref 0.1–1.0)
Monocytes Relative: 29 %
NRBC: 0 % (ref 0.0–0.2)
Neutro Abs: 1.5 10*3/uL — ABNORMAL LOW (ref 1.7–7.7)
Neutrophils Relative %: 30 %
PLATELETS: 198 10*3/uL (ref 150–400)
RBC: 3.68 MIL/uL — ABNORMAL LOW (ref 3.87–5.11)
RDW: 19.9 % — ABNORMAL HIGH (ref 11.5–15.5)
WBC: 4.9 10*3/uL (ref 4.0–10.5)

## 2018-07-14 LAB — BASIC METABOLIC PANEL
ANION GAP: 5 (ref 5–15)
BUN: 9 mg/dL (ref 8–23)
CALCIUM: 8.6 mg/dL — AB (ref 8.9–10.3)
CHLORIDE: 108 mmol/L (ref 98–111)
CO2: 27 mmol/L (ref 22–32)
Creatinine, Ser: 1.02 mg/dL — ABNORMAL HIGH (ref 0.44–1.00)
GFR calc non Af Amer: 55 mL/min — ABNORMAL LOW (ref >60)
Glucose, Bld: 85 mg/dL (ref 70–99)
Potassium: 4.2 mmol/L (ref 3.5–5.1)
Sodium: 140 mmol/L (ref 135–145)

## 2018-07-14 MED ORDER — OXYCODONE HCL 5 MG PO TABS
10.0000 mg | ORAL_TABLET | ORAL | Status: DC | PRN
  Administered 2018-07-14 – 2018-07-15 (×6): 10 mg via ORAL
  Filled 2018-07-14 (×7): qty 2

## 2018-07-14 MED ORDER — SODIUM CHLORIDE 0.9 % IV SOLN: 2.0000 g | INTRAVENOUS | 0 refills | Status: DC

## 2018-07-14 MED ORDER — CEFTRIAXONE IV (FOR PTA / DISCHARGE USE ONLY): 2.0000 g | INTRAVENOUS | 0 refills | Status: DC

## 2018-07-14 MED ORDER — FUROSEMIDE 20 MG PO TABS
20.0000 mg | ORAL_TABLET | Freq: Every day | ORAL | Status: DC
  Administered 2018-07-15: 20 mg via ORAL
  Filled 2018-07-14: qty 1

## 2018-07-14 NOTE — Progress Notes (Signed)
Physical Therapy Session Note  Patient Details  Name: [name] MRN: [id number] Date of Birth: 06-27-1951  Today's Date: 07/14/2018 PT Individual Time: 5643-3295 PT Individual Time Calculation (min): 70 min   Short Term Goals: Week 1:  PT Short Term Goal 1 (Week 1): =LTGs due to ELOS  Skilled Therapeutic Interventions/Progress Updates: Pt presented in bed agreeable to therapy. Pt c/o significant pain in R foot dorsal aspect however unable to receive any medications until 2p. Pt performed supine to sit with modA for truncal support. Pt provided verbal explanation to PTA on how to donn TLSO and sling. Performed stand pivot to w/c supervision and pt transported to rehab gym. Pt participated in seated w/c therex for LE strengthening and endurance.  LAQ 2 x 10 Hip flexion 2 x 10 Hip er with L2 resistance band 2 x 10 Ankle pumps x 15 Ball squeeze x 15 Hamstring pulls with L2 resistance band 2 x 10 Pt also instructed in diaphragmatic breathing for relaxation and pain management.   Pt transported to day room and participated in Cybex Kinetron 80cm/sec for BLE strengthening and endurance x 5 min with brief break. Pt then ambulated 58f with RW and CGA with CGA for w/c transfers and [facility] Pt transported remaining distance back to room and performed stand pivot to return to bed. PTA doffed TLSO and shoulder brace total A and pt performed sit to supine CGA with use of bed features, increased time and effort. With bed flat pt was able to boost up to H[facility] Incwith use of RUE on bed rail and PTA blocking LLE. Pt repositioned self to comfort and left with call bell within reach and needs met.      Therapy Documentation Precautions:  Precautions Precautions: Fall, Back Precaution Booklet Issued: No Precaution Comments: pt recalls 3/3 precautions  Required Braces or Orthoses: Spinal Brace, Sling Spinal Brace: Thoracolumbosacral orthotic, Applied in sitting position Restrictions Weight Bearing  Restrictions: Yes LUE Weight Bearing: Non weight bearing General:   Vital Signs: Therapy Vitals Temp: 99 F (37.2 C) Temp Source: Oral Pulse Rate: 74 BP: 129/62 Pain: Pain Assessment Pain Scale: 0-10 Pain Score: 6  Pain Type: Acute pain Pain Location: Leg Pain Orientation: Right Pain Descriptors / Indicators: Aching;Discomfort;Burning Pain Onset: On-going Pain Intervention(s): Ambulation/increased activity;Repositioned Mobility: Bed Mobility Bed Mobility: Rolling Right;Supine to Sit;Sitting - Scoot to EM[facility]of Bed;Scooting to [facility] Right: Moderate Assistance - Patient 50-74% Supine to Sit: Moderate Assistance - Patient 50-74% Sit to Supine: Moderate Assistance - Patient 50-74% Scooting to HOB: Moderate Assistance - Patient 50-74% Transfers Transfers: Sit to Stand;Stand to Sit;Stand Pivot Transfers Stand to Sit: Supervision/Verbal cueing Stand Pivot Transfers: Supervision/Verbal cueing Stand Pivot Transfer Details: Verbal cues for precautions/safety;Verbal cues for safe use of DME/AE Transfer (Assistive device): Small based quad cane Locomotion : Gait Ambulation: Yes Gait Assistance: Supervision/Verbal cueing Gait Distance (Feet): 35 Feet Assistive device: Small based quad cane Gait Assistance Details: Verbal cues for precautions/safety;Verbal cues for safe use of DME/AE Gait Gait: Yes Gait Pattern: Wide base of support;Decreased trunk rotation;Step-through pattern Gait velocity: slow Stairs / Additional Locomotion Stairs: No WArchitect Yes Wheelchair Assistance: Dependent - Patient 0% Wheelchair Propulsion: Other (comment)(dependent due to LUE fx) Wheelchair Parts Management: Needs assistance  Trunk/Postural Assessment : Cervical Assessment Cervical Assessment: Exceptions to WFL(forward head) Thoracic Assessment Thoracic Assessment: Exceptions to WFL(N/A due to back precautions) Lumbar Assessment Lumbar Assessment:  Exceptions to WFL(N/A due to back precautions) Postural Control Postural Control: Within Functional Limits  Balance: Balance Balance Assessed: Yes Static Sitting Balance Static Sitting - Balance Support: Feet supported;No upper extremity supported Static Sitting - Level of Assistance: 7: Independent Dynamic Sitting Balance Dynamic Sitting - Level of Assistance: 7: Independent Static Standing Balance Static Standing - Balance Support: During functional activity Static Standing - Level of Assistance: 5: Stand by assistance Dynamic Standing Balance Dynamic Standing - Level of Assistance: 5: Stand by assistance Exercises:   Other Treatments:      Therapy/Group: Individual Therapy  Naren Benally  Cande Mastropietro, PTA  07/14/2018, 3:46 PM

## 2018-07-14 NOTE — Progress Notes (Signed)
Physical Therapy Discharge Summary  Patient Details  Name: [name] MRN: [id number] Date of Birth: October 09, 1950  Today's Date: 07/14/2018 PT Individual Time:800-900   Total Time: 60 minutes   Patient has met 5 of 6 long term goals due to improved activity tolerance, improved balance, improved postural control and increased strength.  Patient to discharge at an ambulatory level Supervision.   Patient's care partner is independent to provide the necessary physical assistance at discharge.  Reasons goals not met: Patient limited by pain in back, RLE, and L shoulder. While improved, decreased activity tolerance and weakness prevents pt from ambulating more than 35'.  Recommendation:  Patient will benefit from ongoing skilled PT services in home health setting to continue to advance safe functional mobility, address ongoing impairments in activity tolerance, weakness, pain management, and minimize fall risk.  Equipment: Hospital bed and quad cane  Reasons for discharge: discharge from hospital  Patient/family agrees with progress made and goals achieved: Yes   Skilled Therapeutic Interventions: Pt received laying in bed. Pain and PT discharge described/assesed as described below. Pt shows improvements in strength, endurance, balance, and mobility over LOS. Pt is still at risk for falls as evidenced by slow gait speed and is limited due to pain in RLE and LUE. Education provided on potential fall hazards in the home and how to decrease the risk of fall in the home. No questions regarding discharge at this time. Pt remained seated in recliner at the end of the session with all needs in reach.  PT Discharge Precautions/Restrictions Precautions Precautions: Fall;Back Precaution Comments: pt recalls 3/3 precautions  Required Braces or Orthoses: Spinal Brace;Sling Spinal Brace: Thoracolumbosacral orthotic;Applied in sitting position Restrictions Weight Bearing Restrictions: Yes LUE  Weight Bearing: Non weight bearing Vital Signs Therapy Vitals Pulse Rate: 68 BP: (!) 148/82 Pain Pain Assessment Pain Scale: 0-10 Pain Score: 6  Faces Pain Scale: Hurts little more Pain Type: Acute pain Pain Location: Leg Pain Orientation: Right Pain Descriptors / Indicators: Aching;Discomfort;Burning Pain Onset: On-going Pain Intervention(s): Ambulation/increased activity;Repositioned Vision/Perception  Perception Perception: Within Functional Limits Praxis Praxis: Intact  Cognition Overall Cognitive Status: Within Functional Limits for tasks assessed Arousal/Alertness: Awake/alert Orientation Level: Oriented X4 Attention: Sustained;Selective Sustained Attention: Appears intact Selective Attention: Appears intact Memory: Appears intact Awareness: Appears intact Problem Solving: Appears intact Safety/Judgment: Appears intact Sensation Sensation Light Touch: Impaired Detail Light Touch Impaired Details: Impaired RLE;Impaired LLE(distally in B feet R>L) Coordination Gross Motor Movements are Fluid and Coordinated: No Fine Motor Movements are Fluid and Coordinated: Yes Coordination and Movement Description: Affected by weight, edema, and heightened pain Motor  Motor Motor: Within Functional Limits Motor - Skilled Clinical Observations: acute pain Motor - Discharge Observations: limited by pain and weight  Mobility Bed Mobility Bed Mobility: Rolling Right;Supine to Sit;Sitting - Scoot to [employer] of Bed;Scooting to [facility] Rolling Right: Moderate Assistance - Patient 50-74% Supine to Sit: Moderate Assistance - Patient 50-74% Sit to Supine: Moderate Assistance - Patient 50-74% Scooting to HOB: Moderate Assistance - Patient 50-74% Transfers Transfers: Sit to Stand;Stand to Sit;Stand Pivot Transfers Sit to Stand: Supervision/Verbal cueing Stand to Sit: Supervision/Verbal cueing Stand Pivot Transfers: Supervision/Verbal cueing Stand Pivot Transfer Details: Verbal cues for  precautions/safety;Verbal cues for safe use of DME/AE Transfer (Assistive device): Small based quad cane Locomotion  Gait Ambulation: Yes Gait Assistance: Supervision/Verbal cueing Gait Distance (Feet): 35 Feet Assistive device: Small based quad cane Gait Assistance Details: Verbal cues for precautions/safety;Verbal cues for safe use of DME/AE Gait Gait: Yes Gait Pattern:  Wide base of support;Decreased trunk rotation;Step-through pattern Gait velocity: slow Stairs / Additional Locomotion Stairs: No Architect: Yes Wheelchair Assistance: Dependent - Patient 0% Wheelchair Propulsion: Other (comment)(dependent due to LUE fx) Wheelchair Parts Management: Needs assistance  Trunk/Postural Assessment  Cervical Assessment Cervical Assessment: Exceptions to WFL(forward head) Thoracic Assessment Thoracic Assessment: Exceptions to WFL(N/A due to back precautions) Lumbar Assessment Lumbar Assessment: Exceptions to WFL(N/A due to back precautions) Postural Control Postural Control: Within Functional Limits  Balance Balance Balance Assessed: Yes Static Sitting Balance Static Sitting - Balance Support: Feet supported;No upper extremity supported Static Sitting - Level of Assistance: 7: Independent Dynamic Sitting Balance Dynamic Sitting - Level of Assistance: 7: Independent Static Standing Balance Static Standing - Balance Support: During functional activity Static Standing - Level of Assistance: 5: Stand by assistance Dynamic Standing Balance Dynamic Standing - Level of Assistance: 5: Stand by assistance Extremity Assessment  RUE Assessment RUE Assessment: Exceptions to [facility] Active Range of Motion (AROM) Comments: ~70 degrees shoulder abduction and ~60 degrees shoulder flexion General Strength Comments: unable to internally rotate R UE to complete perihygiene post toileting  LUE Assessment Passive Range of Motion (PROM) Comments: resistracted due to  precautions and pain- kept at side  General Strength Comments: OT slightly abducted arm to wash axilla and attempted to extend elbow, however these movements were incredibly painful for pt. At rest, arm is internally rotated with elbow flexed. Began education regarding importance of self ROM for skin integrity and preservation fo functional ranges LUE Body System: Ortho RLE Assessment RLE Assessment: Exceptions to [facility] General Strength Comments: grossly 4-5/5 with the exception of hip flexion which is 3+ LLE Assessment General Strength Comments: grossly 4/5    [place] Nikolas Casher 07/14/2018, 1:06 PM

## 2018-07-14 NOTE — Progress Notes (Signed)
Occupational Therapy Discharge Summary  Patient Details  Name: [name] MRN: [id number] Date of Birth: 1951-03-01  Patient has met 8 of 8 long term goals due to improved activity tolerance, improved balance and ability to compensate for deficits.  Patient to discharge at overall supervision for basic mobility with quad cane including transfers but requirest max A for toileting and mod A for UB dressing with A for orthosis and max A for LB bathing and dressing level.  Patient's care partner is independent to provide the necessary physical assistance at discharge.  Pt is also able to guide caregivers how to assist her.   Reasons goals not met: n/a  Recommendation:  Patient will benefit from ongoing skilled OT services in home health setting to continue to advance functional skills in the area of BADL and Reduce care partner burden.  Equipment: No equipment provided  Reasons for discharge: treatment goals met and discharge from hospital  Patient/family agrees with progress made and goals achieved: Yes  OT Discharge Precautions/Restrictions  Precautions Precautions: Fall;Back Precaution Comments: pt recalls 3/3 precautions  Required Braces or Orthoses: Spinal Brace;Sling Spinal Brace: Thoracolumbosacral orthotic;Applied in sitting position Restrictions Weight Bearing Restrictions: Yes LUE Weight Bearing: Non weight bearing General   Vital Signs Therapy Vitals Pulse Rate: 68 BP: (!) 148/82 Pain Pain Assessment Pain Score: Asleep Faces Pain Scale: Hurts little more ADL ADL Eating: Set up Where Assessed-Eating: Chair Grooming: Setup Where Assessed-Grooming: Edge of bed Upper Body Bathing: Moderate assistance Where Assessed-Upper Body Bathing: Edge of bed Lower Body Bathing: Moderate assistance Where Assessed-Lower Body Bathing: Edge of bed, Other (Comment) Upper Body Dressing: Maximal assistance Where Assessed-Upper Body Dressing: Edge of bed Lower Body Dressing:  Maximal assistance Where Assessed-Lower Body Dressing: Edge of bed Toileting: Maximal assistance Toilet Transfer: Close supervision Toilet Transfer Method: Stand pivot, Counselling psychologist: Extra wide Radiographer, therapeutic: Not assessed Social research officer, government: Not assessed Vision Baseline Vision/History: Wears glasses Wears Glasses: Reading only Patient Visual Report: No change from baseline Vision Assessment?: No apparent visual deficits Perception  Perception: Within Functional Limits Praxis Praxis: Intact Cognition Overall Cognitive Status: Within Functional Limits for tasks assessed Arousal/Alertness: Awake/alert Orientation Level: Oriented X4 Attention: Sustained Sustained Attention: Appears intact Memory: Appears intact Awareness: Appears intact Problem Solving: Appears intact Safety/Judgment: Appears intact Sensation Sensation Light Touch: Impaired Detail Light Touch Impaired Details: Impaired LUE Coordination Gross Motor Movements are Fluid and Coordinated: No Fine Motor Movements are Fluid and Coordinated: Yes Coordination and Movement Description: Affected by weight, edema, and heightened pain Motor  Motor Motor: Within Functional Limits Motor - Skilled Clinical Observations: acute pain Mobility  Bed Mobility Supine to Sit: Minimal Assistance - Patient > 75% Transfers Sit to Stand: Supervision/Verbal cueing Stand to Sit: Supervision/Verbal cueing  Trunk/Postural Assessment  Cervical Assessment Cervical Assessment: (forward flexion) Thoracic Assessment Thoracic Assessment: (limited due to precautions) Lumbar Assessment Lumbar Assessment: Exceptions to WFL(due to precautions) Postural Control Postural Control: Within Functional Limits  Balance Static Sitting Balance Static Sitting - Balance Support: Feet unsupported;No upper extremity supported Static Sitting - Level of Assistance: 6: Modified independent  (Device/Increase time) Dynamic Sitting Balance Dynamic Sitting - Level of Assistance: 5: Stand by assistance Static Standing Balance Static Standing - Balance Support: Right upper extremity supported;During functional activity Dynamic Standing Balance Dynamic Standing - Level of Assistance: 5: Stand by assistance Extremity/Trunk Assessment RUE Assessment RUE Assessment: Exceptions to [facility] Active Range of Motion (AROM) Comments: ~70 degrees shoulder abduction and ~60 degrees shoulder flexion General  Strength Comments: unable to internally rotate R UE to complete perihygiene post toileting  LUE Assessment Passive Range of Motion (PROM) Comments: resistracted due to precautions and pain- kept at side  General Strength Comments: OT slightly abducted arm to wash axilla and attempted to extend elbow, however these movements were incredibly painful for pt. At rest, arm is internally rotated with elbow flexed. Began education regarding importance of self ROM for skin integrity and preservation fo functional ranges LUE Body System: Ortho   Smith, Jennifer Lynsey 07/14/2018, 11:45 AM 

## 2018-07-14 NOTE — Discharge Summary (Signed)
Discharge summary job # [id number]

## 2018-07-14 NOTE — Progress Notes (Signed)
Social Work Patient ID: [name], female   DOB: 12-12-1950, 67 y.o.   MRN: [id number] Met with pt who has concerns about discharge tomorrow, if she is medically ready. She is having new pain she was not having before. MD has ordered an MRI. Team feels has reached goals but nursing is still not ambulating to the bathroom. Will check with team and MD and get back with pt.

## 2018-07-14 NOTE — Progress Notes (Signed)
PHYSICAL MEDICINE & REHABILITATION PROGRESS NOTE  Subjective/Complaints: Patient seen lying in bed this morning.  She states she slept well overnight.  She states she feels better this morning.  She has questions about her discharge date.  ROS: Denies CP, SOB, N/V/D  Objective: Vital Signs: Blood pressure 134/62, pulse 77, temperature 98.4 F (36.9 C), temperature source Oral, resp. rate 20, height 5\' 3"  (1.6 m), weight 126.3 kg, SpO2 99 %. Dg Thoracic Spine 2 View  Result Date: 07/13/2018 CLINICAL DATA:  Thoracic spine pain for 1 month. EXAM: THORACIC SPINE 2 VIEWS COMPARISON:  CT scan of June 16, 2018. FINDINGS: No definite fracture or spondylolisthesis is noted. Mild-to-moderate osteophyte formation is noted at multiple levels in the mid and lower thoracic spine consistent with degenerative change. T10 fracture noted on prior CT scan is not visualized on these radiographs, although CT scan is more sensitive. IMPRESSION: Multilevel degenerative changes are noted. No definite acute abnormality seen in the thoracic spine. Electronically Signed   By: Marijo Conception, M.D.   On: 07/13/2018 10:30   Dg Lumbar Spine Complete  Result Date: 07/13/2018 CLINICAL DATA:  Midline low back pain. EXAM: LUMBAR SPINE - COMPLETE 4+ VIEW COMPARISON:  06/16/2018.  MRI 06/30/2018. FINDINGS: Postoperative changes in the mid and lower lumbar spine. Advanced degenerative disc and facet disease throughout the lumbar spine. No fracture or malalignment. SI joints are symmetric and unremarkable. No change since prior study. IMPRESSION: Stable spondylosis and degenerative changes. No acute bony abnormality. Electronically Signed   By: Rolm Baptise M.D.   On: 07/13/2018 10:26   Recent Labs    07/14/18 0411  WBC 4.9  HGB 9.2*  HCT 32.9*  PLT 198   Recent Labs    07/14/18 0411  NA 140  K 4.2  CL 108  CO2 27  GLUCOSE 85  BUN 9  CREATININE 1.02*  CALCIUM 8.6*    Physical Exam: BP 134/62 (BP  Location: Right Arm)   Pulse 77   Temp 98.4 F (36.9 C) (Oral)   Resp 20   Ht 5\' 3"  (1.6 m)   Wt 126.3 kg   SpO2 99%   BMI 49.32 kg/m  Constitutional: NAD.  Vital signs reviewed. HENT: Normocephalic.  Atraumatic. Eyes: EOMI. No discharge. Cardiovascular: RRR.  No JVD. Respiratory: CTA bilaterally.  Normal effort. GI: BS +. Non-distended. Musc: No edema or tenderness in extremities. Musculoskeletal: Left upper extremity with shoulder sling.  Left shoulder TTP Neurological: Alert and oriented. Follows full commands.  Motor: LUE is limited by pain and sling.  RUE 5/5 proximal to distal.  Left lower extremity: 4+/5 proximal distal, stable Right lower extremity: 4-/5 proximal to distal (stable, some pain inhibition) Skin: Skin iswarmand dry.  Psychiatric:Normal affect and normal mood.   Assessment/Plan: 1. Functional deficits secondary to multi-Ortho with epidural abscess which require 3+ hours per day of interdisciplinary therapy in a comprehensive inpatient rehab setting.  Physiatrist is providing close team supervision and 24 hour management of active medical problems listed below.  Physiatrist and rehab team continue to assess barriers to discharge/monitor patient progress toward functional and medical goals  Care Tool:  Bathing    Body parts bathed by patient: Face   Body parts bathed by helper: Right lower leg, Left lower leg Body parts n/a: Right arm, Left arm, Chest, Front perineal area, Buttocks, Right upper leg, Left upper leg(Did not attempt secondary to pain and needing to lay back down)   Bathing assist Assist Level: Moderate  Assistance - Patient 50 - 74%     Upper Body Dressing/Undressing Upper body dressing   What is the patient wearing?: Orthosis    Upper body assist Assist Level: Total Assistance - Patient < 25%    Lower Body Dressing/Undressing Lower body dressing      What is the patient wearing?: Pants     Lower body assist Assist for  lower body dressing: Maximal Assistance - Patient 25 - 49%     Toileting Toileting    Toileting assist Assist for toileting: Dependent - Patient 0%     Transfers Chair/bed transfer  Transfers assist  Chair/bed transfer activity did not occur: Safety/medical concerns(increase pain)  Chair/bed transfer assist level: Contact Guard/Touching assist(recliner<>w/c)     Locomotion Ambulation   Ambulation assist   Ambulation activity did not occur: Safety/medical concerns  Assist level: Contact Guard/Touching assist Assistive device: Cane-quad Max distance: 37 ft   Walk 10 feet activity   Assist     Assist level: Contact Guard/Touching assist Assistive device: Cane-quad   Walk 50 feet activity   Assist Walk 50 feet with 2 turns activity did not occur: Safety/medical concerns         Walk 150 feet activity   Assist Walk 150 feet activity did not occur: Safety/medical concerns         Walk 10 feet on uneven surface  activity   Assist Walk 10 feet on uneven surfaces activity did not occur: Safety/medical concerns         Wheelchair     Assist               Wheelchair 50 feet with 2 turns activity    Assist            Wheelchair 150 feet activity     Assist            Medical Problem List and Plan: 1.Decreased functional mobilitysecondary to history of falls with left humerus fracture, T10 fracture 06/16/2018-TLSO and a right L5 pars defect/epidural abscess related to recent fall.   Cont CIR  Repeat thoracic and lumbar images reviewed, relatively unremarkable, MRI ordered  2. DVT Prophylaxis/Anticoagulation: Subcutaneous Lovenox.   Vascular study negative for DVT 3. Pain Management:  Neurontin changed to 600 mg scheduled on 11/21  Robaxin 500 mg increased to 750 3 times daily  Lidoderm patch started on 11/18, with improvement  Oxycodone increased, will decrease on 11/26 4. Mood:Celexa20 mg daily 5.  Neuropsych: This patientiscapable of making decisions on herown behalf. 6. Skin/Wound Care:Routine skin checks 7. Fluids/Electrolytes/Nutrition:Routine in and out's 8.Recurrent epidural abscess. Continue Rocephin until 08/25/2018.  9. Hypertension. Labetalol 200 mg twice a day  Reduced hydralazine to 10mg  TID  Controlled on 11/26 10.Chronic diastolic congestive heart failure. Monitor for signs of fluid overload Filed Weights   07/12/18 0457 07/13/18 0527 07/14/18 0548  Weight: 124.2 kg 125.8 kg 126.3 kg   Trending up on 11/26  Lasix started on 11/26 11.?  CKD stage II.   Creatinine 1.02 on 11/26 12. Morbid obesity. Dietary follow-up 13.  Acute blood loss anemia  Hemoglobin 9.2 on 11/26 14. Chills:?  Resolved  Afebrile   CXR negative for acute intracranial process  WBCs WNL  UA equivocal, urine culture multiple species  LOS: 11 days A FACE TO FACE EVALUATION WAS PERFORMED  Evertte Sones [name] 07/14/2018, 8:25 AM

## 2018-07-14 NOTE — Progress Notes (Addendum)
Occupational Therapy Session Note  Patient Details  Name: [name] MRN: [id number] Date of Birth: 01-Apr-1951  Today's Date: 07/14/2018 OT Individual Time: 1000-1045 OT Individual Time Calculation (min): 45 min    Short Term Goals: Week 1:  OT Short Term Goal 1 (Week 1): STGs = LTGs Week 2:     Skilled Therapeutic Interventions/Progress Updates:    1:1 Focus on self care retraining (for grad day). Pt still with increased pain in buttocks and down legs - requested to lay back down but agreeable. Pt participating in bathing at EOB with setup. Pt required A for washing UEs, periarea, buttocks and lower legs. Pt able to perform sit to stands with supervision. Pt able to given instructions to caregiver on how to don and doff braces and left UE sling. Pt required A to don due to restrictions of left UE. Pt able to participate in toileting with max A and toilet transfers and bed to chair transfers with supervision with quad based cane. Pt awaiting MRI of back prior to d/c home.  Got back into bed into supine position with min A . Pillows propped up under bottom for more comfort.   Therapy Documentation Precautions:  Precautions Precautions: Fall, Back Precaution Booklet Issued: No Precaution Comments: pt recalls 3/3 precautions  Required Braces or Orthoses: Spinal Brace, Sling Spinal Brace: Thoracolumbosacral orthotic, Applied in sitting position Restrictions Weight Bearing Restrictions: Yes LUE Weight Bearing: Non weight bearing General:   Vital Signs: Therapy Vitals Pulse Rate: 68 BP: (!) 148/82 Pain: Pain Assessment Pain Score: Asleep Faces Pain Scale: Hurts little more   Therapy/Group: Individual Therapy  Willeen Cass [facility] 07/14/2018, 11:21 AM

## 2018-07-14 NOTE — Progress Notes (Signed)
Social Work  Discharge Note  The overall goal for the admission was met for:   Discharge location: Yes-HOME WITH DAUGHTER AND PCS WORKER  Length of Stay: Yes-12 DAYS  Discharge activity level: Yes-SUPERVISION-MIN ASSIST LEVEL  Home/community participation: Yes  Services provided included: MD, RD, PT, OT, RN, CM, TR, Pharmacy and SW  Financial Services: Medicare and Medicaid  Follow-up services arranged: Home Health: [facility]-PT,OT,RN, DME: [facility] and Patient/Family request agency HH: [facility], DME: HAS OTHER DME FROM [facility]  Comments (or additional information):PT DID WELL BUT STRUGGLED WITH HER PAIN ISSUES WHILE HERE. NEEDS TO PUSH HERSELF AND CAN DO MUCH MORE THAN SHE THINKS SHE CAN  Patient/Family verbalized understanding of follow-up arrangements: Yes  Individual responsible for coordination of the follow-up plan: [facility] & PATIENT  Confirmed correct DME delivered: Elease Hashimoto 07/14/2018    Elease Hashimoto

## 2018-07-15 DIAGNOSIS — I5042 Chronic combined systolic (congestive) and diastolic (congestive) heart failure: Secondary | ICD-10-CM | POA: Diagnosis not present

## 2018-07-15 DIAGNOSIS — S42212D Unspecified displaced fracture of surgical neck of left humerus, subsequent encounter for fracture with routine healing: Secondary | ICD-10-CM | POA: Diagnosis not present

## 2018-07-15 DIAGNOSIS — I130 Hypertensive heart and chronic kidney disease with heart failure and stage 1 through stage 4 chronic kidney disease, or unspecified chronic kidney disease: Secondary | ICD-10-CM | POA: Diagnosis not present

## 2018-07-15 DIAGNOSIS — K661 Hemoperitoneum: Secondary | ICD-10-CM | POA: Diagnosis not present

## 2018-07-15 DIAGNOSIS — M5417 Radiculopathy, lumbosacral region: Secondary | ICD-10-CM

## 2018-07-15 DIAGNOSIS — S22079D Unspecified fracture of T9-T10 vertebra, subsequent encounter for fracture with routine healing: Secondary | ICD-10-CM | POA: Diagnosis not present

## 2018-07-15 DIAGNOSIS — G9341 Metabolic encephalopathy: Secondary | ICD-10-CM | POA: Diagnosis not present

## 2018-07-15 MED ORDER — METHYLPREDNISOLONE 4 MG PO TBPK: ORAL_TABLET | ORAL | Status: DC

## 2018-07-15 MED ORDER — METHYLPREDNISOLONE 4 MG PO TBPK: 8.0000 mg | ORAL_TABLET | Freq: Every evening | ORAL | Status: DC

## 2018-07-15 MED ORDER — METHYLPREDNISOLONE 4 MG PO TBPK: 4.0000 mg | ORAL_TABLET | Freq: Four times a day (QID) | ORAL | Status: DC

## 2018-07-15 MED ORDER — TRAMADOL HCL 50 MG PO TABS: 50.0000 mg | ORAL_TABLET | Freq: Four times a day (QID) | ORAL | 0 refills | Status: AC | PRN

## 2018-07-15 MED ORDER — METHYLPREDNISOLONE 4 MG PO TBPK: 4.0000 mg | ORAL_TABLET | Freq: Three times a day (TID) | ORAL | Status: DC

## 2018-07-15 MED ORDER — GABAPENTIN 300 MG PO CAPS: 900.0000 mg | ORAL_CAPSULE | Freq: Three times a day (TID) | ORAL | 0 refills | Status: AC

## 2018-07-15 MED ORDER — CITALOPRAM HYDROBROMIDE 20 MG PO TABS: 20.0000 mg | ORAL_TABLET | Freq: Every day | ORAL | 0 refills | Status: AC

## 2018-07-15 MED ORDER — HEPARIN SOD (PORK) LOCK FLUSH 100 UNIT/ML IV SOLN
250.0000 [IU] | INTRAVENOUS | Status: AC | PRN
  Administered 2018-07-15: 250 [IU]

## 2018-07-15 MED ORDER — ACETAMINOPHEN 325 MG PO TABS: 650.0000 mg | ORAL_TABLET | Freq: Four times a day (QID) | ORAL | Status: DC | PRN

## 2018-07-15 MED ORDER — LIDOCAINE 5 % EX PTCH: MEDICATED_PATCH | CUTANEOUS | 0 refills | Status: DC

## 2018-07-15 MED ORDER — METHYLPREDNISOLONE 4 MG PO TBPK
4.0000 mg | ORAL_TABLET | ORAL | Status: AC
  Administered 2018-07-15: 4 mg via ORAL

## 2018-07-15 MED ORDER — METHYLPREDNISOLONE 4 MG PO TBPK: 4.0000 mg | ORAL_TABLET | ORAL | Status: DC

## 2018-07-15 MED ORDER — FUROSEMIDE 20 MG PO TABS: 20.0000 mg | ORAL_TABLET | Freq: Every day | ORAL | 0 refills | Status: AC

## 2018-07-15 MED ORDER — METHYLPREDNISOLONE 4 MG PO TBPK
8.0000 mg | ORAL_TABLET | Freq: Every morning | ORAL | Status: AC
  Administered 2018-07-15: 8 mg via ORAL
  Filled 2018-07-15: qty 21

## 2018-07-15 MED ORDER — DICLOFENAC SODIUM 1 % TD GEL: 2.0000 g | Freq: Four times a day (QID) | TRANSDERMAL | 0 refills | Status: DC

## 2018-07-15 MED ORDER — METHOCARBAMOL 750 MG PO TABS: 750.0000 mg | ORAL_TABLET | Freq: Three times a day (TID) | ORAL | 0 refills | Status: AC

## 2018-07-15 MED ORDER — OXYCODONE HCL 10 MG PO TABS: 10.0000 mg | ORAL_TABLET | Freq: Four times a day (QID) | ORAL | 0 refills | Status: DC | PRN

## 2018-07-15 MED ORDER — GABAPENTIN 300 MG PO CAPS
900.0000 mg | ORAL_CAPSULE | Freq: Three times a day (TID) | ORAL | Status: DC
  Administered 2018-07-15: 900 mg via ORAL
  Filled 2018-07-15: qty 3

## 2018-07-15 NOTE — Progress Notes (Signed)
Rx written for Oxycodone 10 mg #28 pills take qid prn and Ultram 50 mg #28 pills take qid prn.  Patient advised to taper to home dose of Oxy 5 mg tid once Oxy 10 completed.

## 2018-07-15 NOTE — Discharge Summary (Addendum)
NAMEBENAY, Dorsey MEDICAL RECORD OI:[id number] ACCOUNT [id number] DATE OF BIRTH:1950-10-09 FACILITY: [facility]: [facility] PHYSICIAN:Deloras Reichard, MD  DISCHARGE SUMMARY  DATE OF DISCHARGE:  07/15/2018  DISCHARGE DIAGNOSES: 1.  Left humerus fracture, T10 fracture, as well as right L3-L5 pars defects with epidural abscess related to recent fall. 2.  Subcutaneous Lovenox for deep venous thrombosis prophylaxis. 3.  Pain management. 4.  Hypertension. 5.  Chronic diastolic congestive heart failure. 6.  Chronic kidney disease stage II.   7.  Morbid obesity. 8.  Acute blood loss anemia.  HOSPITAL COURSE:  This is a 67 year old right-handed female with history of morbid obesity, chronic kidney disease stage II to III, diastolic congestive heart failure, chronic low back pain, prior osteomyelitis discitis, spinal epidural abscess in the  thoracolumbar region.  She did remain improved after a long course of antibiotic therapy.  Lives with daughter, has a home health aide.  She needed some assistance with ADLs and ambulation prior to admission.  Recent admission 06/16/2018 to 06/23/2018  after a fall sustaining a left proximal humerus neck and greater tuberosity fracture as well as acute minimally displaced fracture of T10 vertebral body, placed in TLSO back brace, nonweightbearing left lower extremity x6 weeks per Dr. Griffin Basil after  surgical intervention.  Presented on 06/30/2018 with increasing back pain.  MRI lumbar spine showed acute fracture right L5 pars intraarticularis defect and a new grade L5-S1 anterolisthesis.  Also noted persistent abnormal epidural enhancement  concerning for epidural abscess.  It was advised to remain on Rocephin as prior to admission.  Subcutaneous Lovenox was added for DVT prophylaxis.  The patient was admitted for a comprehensive rehabilitation program.  PAST MEDICAL HISTORY:  See discharge diagnoses.  SOCIAL HISTORY:  Lives with her daughter.  Needed some  assistance with ADLs and mobility prior to admission.  FUNCTIONAL STATUS:  Upon admission to rehab services was +2 sit to stand, +2 physical assist side lying to sitting, mod/max assist activities of daily living.  PHYSICAL EXAMINATION: VITAL SIGNS:  Blood pressure 166/64, pulse 79, temperature 98, respirations 16. GENERAL:  Alert female in no acute distress. HEENT:  EOMs intact. NECK:  Supple, nontender, no JVD. CARDIOVASCULAR:  Rate controlled. ABDOMEN:  Soft, nontender, good bowel sounds. EXTREMITIES:  Left upper extremity with shoulder sling.  REHABILITATION HOSPITAL COURSE:  The patient was admitted to inpatient rehabilitation services.  Therapies initiated on a 3-hour daily basis, consisting of physical therapy, occupational therapy and rehabilitation nursing.  The following issues were  addressed during patient's rehabilitation stay:    Pertaining to the patient's left humerus fracture, T10 fracture 06/08/2018, TLSO back brace as advised.  Repeat thoracic, lumbar spine is relatively unremarkable.  MRI completed lumbar spine.  No high grade spinal canal stenosis noted rib fracture  identified.  She remained on subcutaneous Lovenox for DVT prophylaxis.  Venous Doppler studies negative.  Pain management with use of Neurontin, Robaxin and Lidoderm patch, oxycodone for breakthrough pain.  Mood stabilization with Celexa.  Needed some  encouragement at times to participate.  Noted recurrent epidural abscess, followed by infectious disease.  She will remain on intravenous Rocephin through 08/25/2018.  Follow up infectious disease.  Blood pressure is controlled with labetalol.  She had  been on hydralazine.  Reduced to 10 mg 3 times daily.  She exhibited no other signs of fluid overload.  She remained on low dose Lasix.  Acute blood loss anemia 9.2.  The patient received weekly collaborative interdisciplinary team conferences to discuss  estimated length  of stay, family teaching, any barriers to  discharge.  Performed supine to sit, moderate assist for truncal support.  TLSO back brace.  Ambulates 43 feet, rolling walker, contact guard assist.  Needing some assistance for left upper  extremity to remain nonweightbearing.  Patient full family teaching completed and planned discharge to home.  DISCHARGE MEDICATIONS:  Included Rocephin 2 grams daily through 08/25/2018 and stop. Celexa 20 mg p.o. daily, Voltaren gel 2 grams 4 times a day to affected area.  Lasix 20 mg p.o. daily, Neurontin 600 mg p.o. t.i.d., hydralazine 10 mg p.o. t.i.d.,  labetalol 200 mg p.o. b.i.d., Lidoderm patch daily, Robaxin 750 mg p.o. t.i.d., Protonix 40 mg p.o. daily, Senokot-S 1 tablet p.o. at bedtime, oxycodone 10 mg every 4 hours as needed for pain.  DIET:  Regular consistency.  The patient will follow up with Dr. Delice Lesch at the outpatient rehab service office as directed; Dr. Griffin Basil, call for appointment.  Dr. Scharlene Gloss, infectious disease for appointment; Dr. Sherley Bounds, call for appointment; Dr. Bevelyn Buckles,  medical management.  SPECIAL INSTRUCTIONS:  Nonweightbearing left upper extremity.  TLSO Back brace when out of bed.  Home health nurse for administration of intravenous Rocephin 2 grams daily through 08/25/2018 and stop.  AN/NUANCE D:07/14/2018 T:07/15/2018 JOB:[id number]  Patient seen and examined by me on day of discharge. Delice Lesch, MD, ABPMR

## 2018-07-15 NOTE — Progress Notes (Signed)
Please see discharge summary as well.  Patient notes improvement in pain over the last 2 days.  There is a strong behavioral component as well.  X-rays and MRIs reviewed, discussed with patient.  There is some contact of L5 nerve root on right, however patient clinically improving with no changes in strength or sensation.  She will need follow-up as outpatient.  Will increase dose of gabapentin and order Medrol Dosepak for discharge.  She later states that she does not have a ride to get home today.  Will attempt to find potential solutions.  >35 minutes spent with > 30 minutes in counseling and coordination of care regarding aforementioned and discharge venue/caregiver support, IV antibiotics, etc.

## 2018-07-15 NOTE — Discharge Instructions (Signed)
Inpatient Rehab Discharge Instructions  Bernard Discharge date and time: 07/15/18   Activities/Precautions/ Functional Status: Activity: No weight on Left arm. Need to have TLSO back brace when out of bed Diet: regular diet Wound Care: none needed    Functional status:  ___ No restrictions     ___ Walk up steps independently _X__ 24/7 supervision/assistance   ___ Walk up steps with assistance ___ Intermittent supervision/assistance  ___ Bathe/dress independently ___ Walk with walker     _x__ Bathe/dress with assistance ___ Walk Independently    ___ Shower independently _X__ Walk with assistance    ___ Shower with assistance ___ No alcohol     ___ Return to work/school ________   Special Instructions: 1. Home health nurse for administration of intravenous ceftriaxone 2 g daily until 08/25/2018 and stop 2. You have a new prescription for oxycodone 10 mg due to surgery. Once this is used up you can taper to your 5 mg pills (filled 06/16/18). You can use tramadol in between so that you can spread out medication.   COMMUNITY REFERRALS UPON DISCHARGE:    Home Health:   PT, OT, RN   Agency:[facility]   Date of last service:07/15/2018  Medical Equipment/Items Ordered:HOSPITAL BED & [facility]  Agency/Supplier:[facility]  [phone]  Other:[facility] [place]    My questions have been answered and I understand these instructions. I will adhere to these goals and the provided educational materials after my discharge from the hospital.  Patient/Caregiver Signature _______________________________ Date __________  Clinician Signature _______________________________________ Date __________  Please bring this form and your medication list with you to all your follow-up doctor's appointments.

## 2018-07-17 DIAGNOSIS — I5042 Chronic combined systolic (congestive) and diastolic (congestive) heart failure: Secondary | ICD-10-CM | POA: Diagnosis not present

## 2018-07-17 DIAGNOSIS — G9341 Metabolic encephalopathy: Secondary | ICD-10-CM | POA: Diagnosis not present

## 2018-07-17 DIAGNOSIS — S42212D Unspecified displaced fracture of surgical neck of left humerus, subsequent encounter for fracture with routine healing: Secondary | ICD-10-CM | POA: Diagnosis not present

## 2018-07-17 DIAGNOSIS — I130 Hypertensive heart and chronic kidney disease with heart failure and stage 1 through stage 4 chronic kidney disease, or unspecified chronic kidney disease: Secondary | ICD-10-CM | POA: Diagnosis not present

## 2018-07-17 DIAGNOSIS — S22079D Unspecified fracture of T9-T10 vertebra, subsequent encounter for fracture with routine healing: Secondary | ICD-10-CM | POA: Diagnosis not present

## 2018-07-17 DIAGNOSIS — K661 Hemoperitoneum: Secondary | ICD-10-CM | POA: Diagnosis not present

## 2018-07-20 ENCOUNTER — Inpatient Hospital Stay: Payer: Self-pay | Admitting: Internal Medicine

## 2018-07-20 DIAGNOSIS — S22079D Unspecified fracture of T9-T10 vertebra, subsequent encounter for fracture with routine healing: Secondary | ICD-10-CM | POA: Diagnosis not present

## 2018-07-20 DIAGNOSIS — K661 Hemoperitoneum: Secondary | ICD-10-CM | POA: Diagnosis not present

## 2018-07-20 DIAGNOSIS — S42212D Unspecified displaced fracture of surgical neck of left humerus, subsequent encounter for fracture with routine healing: Secondary | ICD-10-CM | POA: Diagnosis not present

## 2018-07-20 DIAGNOSIS — I5042 Chronic combined systolic (congestive) and diastolic (congestive) heart failure: Secondary | ICD-10-CM | POA: Diagnosis not present

## 2018-07-20 DIAGNOSIS — G9341 Metabolic encephalopathy: Secondary | ICD-10-CM | POA: Diagnosis not present

## 2018-07-20 DIAGNOSIS — I130 Hypertensive heart and chronic kidney disease with heart failure and stage 1 through stage 4 chronic kidney disease, or unspecified chronic kidney disease: Secondary | ICD-10-CM | POA: Diagnosis not present

## 2018-07-21 ENCOUNTER — Telehealth: Payer: Self-pay

## 2018-07-21 DIAGNOSIS — G9341 Metabolic encephalopathy: Secondary | ICD-10-CM | POA: Diagnosis not present

## 2018-07-21 DIAGNOSIS — D631 Anemia in chronic kidney disease: Secondary | ICD-10-CM | POA: Diagnosis not present

## 2018-07-21 DIAGNOSIS — Z792 Long term (current) use of antibiotics: Secondary | ICD-10-CM | POA: Diagnosis not present

## 2018-07-21 DIAGNOSIS — S22079D Unspecified fracture of T9-T10 vertebra, subsequent encounter for fracture with routine healing: Secondary | ICD-10-CM | POA: Diagnosis not present

## 2018-07-21 DIAGNOSIS — S42212D Unspecified displaced fracture of surgical neck of left humerus, subsequent encounter for fracture with routine healing: Secondary | ICD-10-CM | POA: Diagnosis not present

## 2018-07-21 DIAGNOSIS — I130 Hypertensive heart and chronic kidney disease with heart failure and stage 1 through stage 4 chronic kidney disease, or unspecified chronic kidney disease: Secondary | ICD-10-CM | POA: Diagnosis not present

## 2018-07-21 DIAGNOSIS — Z5181 Encounter for therapeutic drug level monitoring: Secondary | ICD-10-CM | POA: Diagnosis not present

## 2018-07-21 DIAGNOSIS — I5042 Chronic combined systolic (congestive) and diastolic (congestive) heart failure: Secondary | ICD-10-CM | POA: Diagnosis not present

## 2018-07-21 DIAGNOSIS — K661 Hemoperitoneum: Secondary | ICD-10-CM | POA: Diagnosis not present

## 2018-07-21 NOTE — Telephone Encounter (Signed)
Transitional Care call-patient    1. Are you/is patient experiencing any problems since coming home? Pain in right leg and foot Are there any questions regarding any aspect of care? No 2. Are there any questions regarding medications administration/dosing? No Are meds being taken as prescribed? Yes Patient should review meds with caller to confirm 3. Have there been any falls? No 4. Has Home Health been to the house and/or have they contacted you? Yes If not, have you tried to contact them? Can we help you contact them? 5. Are bowels and bladder emptying properly? Yes Are there any unexpected incontinence issues? No If applicable, is patient following bowel/bladder programs? 6. Any fevers, problems with breathing, unexpected pain? No 7. Are there any skin problems or new areas of breakdown? No 8. Has the patient/family member arranged specialty MD follow up (ie cardiology/neurology/renal/surgical/etc)?  Yes Can we help arrange? 9. Does the patient need any other services or support that we can help arrange? No 10. Are caregivers following through as expected in assisting the patient? Yes  11. Has the patient quit smoking, drinking alcohol, or using drugs as recommended? Yes  Appointment time, arrive time 1:00 pm for 1:20 pm appt with Dr. Posey Pronto [phone]

## 2018-07-23 ENCOUNTER — Telehealth: Payer: Self-pay | Admitting: Behavioral Health

## 2018-07-23 DIAGNOSIS — G9341 Metabolic encephalopathy: Secondary | ICD-10-CM | POA: Diagnosis not present

## 2018-07-23 DIAGNOSIS — S42212D Unspecified displaced fracture of surgical neck of left humerus, subsequent encounter for fracture with routine healing: Secondary | ICD-10-CM | POA: Diagnosis not present

## 2018-07-23 DIAGNOSIS — I5042 Chronic combined systolic (congestive) and diastolic (congestive) heart failure: Secondary | ICD-10-CM | POA: Diagnosis not present

## 2018-07-23 DIAGNOSIS — K661 Hemoperitoneum: Secondary | ICD-10-CM | POA: Diagnosis not present

## 2018-07-23 DIAGNOSIS — S22079D Unspecified fracture of T9-T10 vertebra, subsequent encounter for fracture with routine healing: Secondary | ICD-10-CM | POA: Diagnosis not present

## 2018-07-23 DIAGNOSIS — I130 Hypertensive heart and chronic kidney disease with heart failure and stage 1 through stage 4 chronic kidney disease, or unspecified chronic kidney disease: Secondary | ICD-10-CM | POA: Diagnosis not present

## 2018-07-23 NOTE — Telephone Encounter (Signed)
[facility] called stating patient PICC line was originally out 3 cm however now per home health nurse the PICC was out 6cm today.  Mary wanted Dr. Linus Salmons to be aware.  She states she would page Dr. Linus Salmons for further instructions. Pricilla Riffle RN

## 2018-07-24 ENCOUNTER — Encounter: Payer: Self-pay | Admitting: Registered Nurse

## 2018-07-24 DIAGNOSIS — G9341 Metabolic encephalopathy: Secondary | ICD-10-CM | POA: Diagnosis not present

## 2018-07-24 DIAGNOSIS — I5042 Chronic combined systolic (congestive) and diastolic (congestive) heart failure: Secondary | ICD-10-CM | POA: Diagnosis not present

## 2018-07-24 DIAGNOSIS — K661 Hemoperitoneum: Secondary | ICD-10-CM | POA: Diagnosis not present

## 2018-07-24 DIAGNOSIS — I130 Hypertensive heart and chronic kidney disease with heart failure and stage 1 through stage 4 chronic kidney disease, or unspecified chronic kidney disease: Secondary | ICD-10-CM | POA: Diagnosis not present

## 2018-07-24 DIAGNOSIS — S22079D Unspecified fracture of T9-T10 vertebra, subsequent encounter for fracture with routine healing: Secondary | ICD-10-CM | POA: Diagnosis not present

## 2018-07-24 DIAGNOSIS — S42212D Unspecified displaced fracture of surgical neck of left humerus, subsequent encounter for fracture with routine healing: Secondary | ICD-10-CM | POA: Diagnosis not present

## 2018-07-27 ENCOUNTER — Telehealth: Payer: Self-pay | Admitting: *Deleted

## 2018-07-27 ENCOUNTER — Telehealth: Payer: Self-pay | Admitting: Internal Medicine

## 2018-07-27 DIAGNOSIS — T8149XA Infection following a procedure, other surgical site, initial encounter: Secondary | ICD-10-CM

## 2018-07-27 NOTE — Telephone Encounter (Signed)
Received call from [facility].  Patient's PICC fell out over the weekend, needs assistance getting the patient an appointment for replacement WITH sutures to keep it in place.  RN received verbal order from Dr Linus Salmons for PICC replacement.  RN spoke with Tiffany/Kathy at [facility] IR, scheduled patient for 12/10 at 9am.  She accepted appointment, will find transportation.  RN notified [place]. Landis Gandy, RN

## 2018-07-27 NOTE — Telephone Encounter (Signed)
I was called several times about the patients picc line over the last 2-3 days. Initially out 3 cm but today came completely out.  She will go for a replacement and need to have a sutured catheter/line. Thayer Headings, MD

## 2018-07-28 ENCOUNTER — Ambulatory Visit (HOSPITAL_COMMUNITY)
Admission: RE | Admit: 2018-07-28 | Discharge: 2018-07-28 | Disposition: A | Discharge status: Home / Self Care | Payer: Medicare Other | Source: Ambulatory Visit | Attending: Diagnostic Radiology | Admitting: Diagnostic Radiology

## 2018-07-28 ENCOUNTER — Other Ambulatory Visit (HOSPITAL_COMMUNITY): Payer: Self-pay | Admitting: Diagnostic Radiology

## 2018-07-28 ENCOUNTER — Other Ambulatory Visit: Payer: Self-pay | Admitting: Internal Medicine

## 2018-07-28 ENCOUNTER — Ambulatory Visit (HOSPITAL_COMMUNITY)
Admission: RE | Admit: 2018-07-28 | Discharge: 2018-07-28 | Disposition: A | Discharge status: Home / Self Care | Payer: Medicare Other | Source: Ambulatory Visit | Attending: Internal Medicine | Admitting: Internal Medicine

## 2018-07-28 DIAGNOSIS — G9341 Metabolic encephalopathy: Secondary | ICD-10-CM | POA: Diagnosis not present

## 2018-07-28 DIAGNOSIS — I130 Hypertensive heart and chronic kidney disease with heart failure and stage 1 through stage 4 chronic kidney disease, or unspecified chronic kidney disease: Secondary | ICD-10-CM | POA: Diagnosis not present

## 2018-07-28 DIAGNOSIS — I5042 Chronic combined systolic (congestive) and diastolic (congestive) heart failure: Secondary | ICD-10-CM | POA: Diagnosis not present

## 2018-07-28 DIAGNOSIS — I878 Other specified disorders of veins: Secondary | ICD-10-CM | POA: Insufficient documentation

## 2018-07-28 DIAGNOSIS — S42302A Unspecified fracture of shaft of humerus, left arm, initial encounter for closed fracture: Secondary | ICD-10-CM | POA: Diagnosis not present

## 2018-07-28 DIAGNOSIS — T8149XA Infection following a procedure, other surgical site, initial encounter: Secondary | ICD-10-CM

## 2018-07-28 DIAGNOSIS — G062 Extradural and subdural abscess, unspecified: Secondary | ICD-10-CM | POA: Diagnosis not present

## 2018-07-28 DIAGNOSIS — Z452 Encounter for adjustment and management of vascular access device: Secondary | ICD-10-CM

## 2018-07-28 DIAGNOSIS — K661 Hemoperitoneum: Secondary | ICD-10-CM | POA: Diagnosis not present

## 2018-07-28 DIAGNOSIS — S22079D Unspecified fracture of T9-T10 vertebra, subsequent encounter for fracture with routine healing: Secondary | ICD-10-CM | POA: Diagnosis not present

## 2018-07-28 DIAGNOSIS — S42212D Unspecified displaced fracture of surgical neck of left humerus, subsequent encounter for fracture with routine healing: Secondary | ICD-10-CM | POA: Diagnosis not present

## 2018-07-28 MED ORDER — LIDOCAINE HCL 1 % IJ SOLN
INTRAMUSCULAR | Status: DC | PRN
  Administered 2018-07-28: 15 mL

## 2018-07-28 MED ORDER — HEPARIN SOD (PORK) LOCK FLUSH 100 UNIT/ML IV SOLN
INTRAVENOUS | Status: AC
  Filled 2018-07-28: qty 5

## 2018-07-28 MED ORDER — LIDOCAINE HCL 1 % IJ SOLN
INTRAMUSCULAR | Status: AC
  Filled 2018-07-28: qty 40

## 2018-07-28 NOTE — Procedures (Signed)
Interventional Radiology Procedure:   Indications: Needs PICC line for IV antibiotics, previous PICC fell out  Procedure: PICC line placement with US guidance.  Placement confirmed with CXR after placement.  Findings: Single lumen Power PICC placed in right basilic vein with US guidance.  Length - 41 cm.  Tip in SVC placed on CXR.  Complications: None     EBL: None  Plan: PICC is ready to use.    Merdis Snodgrass R. Anselm Pancoast, MD  Pager: [phone]

## 2018-07-29 DIAGNOSIS — Z5181 Encounter for therapeutic drug level monitoring: Secondary | ICD-10-CM | POA: Diagnosis not present

## 2018-07-29 DIAGNOSIS — K661 Hemoperitoneum: Secondary | ICD-10-CM | POA: Diagnosis not present

## 2018-07-29 DIAGNOSIS — S22079D Unspecified fracture of T9-T10 vertebra, subsequent encounter for fracture with routine healing: Secondary | ICD-10-CM | POA: Diagnosis not present

## 2018-07-29 DIAGNOSIS — D631 Anemia in chronic kidney disease: Secondary | ICD-10-CM | POA: Diagnosis not present

## 2018-07-29 DIAGNOSIS — Z792 Long term (current) use of antibiotics: Secondary | ICD-10-CM | POA: Diagnosis not present

## 2018-07-29 DIAGNOSIS — S42212D Unspecified displaced fracture of surgical neck of left humerus, subsequent encounter for fracture with routine healing: Secondary | ICD-10-CM | POA: Diagnosis not present

## 2018-07-29 DIAGNOSIS — I130 Hypertensive heart and chronic kidney disease with heart failure and stage 1 through stage 4 chronic kidney disease, or unspecified chronic kidney disease: Secondary | ICD-10-CM | POA: Diagnosis not present

## 2018-07-29 DIAGNOSIS — G9341 Metabolic encephalopathy: Secondary | ICD-10-CM | POA: Diagnosis not present

## 2018-07-29 DIAGNOSIS — I5042 Chronic combined systolic (congestive) and diastolic (congestive) heart failure: Secondary | ICD-10-CM | POA: Diagnosis not present

## 2018-07-31 DIAGNOSIS — K661 Hemoperitoneum: Secondary | ICD-10-CM | POA: Diagnosis not present

## 2018-07-31 DIAGNOSIS — I130 Hypertensive heart and chronic kidney disease with heart failure and stage 1 through stage 4 chronic kidney disease, or unspecified chronic kidney disease: Secondary | ICD-10-CM | POA: Diagnosis not present

## 2018-07-31 DIAGNOSIS — G9341 Metabolic encephalopathy: Secondary | ICD-10-CM | POA: Diagnosis not present

## 2018-07-31 DIAGNOSIS — I5042 Chronic combined systolic (congestive) and diastolic (congestive) heart failure: Secondary | ICD-10-CM | POA: Diagnosis not present

## 2018-07-31 DIAGNOSIS — S42212D Unspecified displaced fracture of surgical neck of left humerus, subsequent encounter for fracture with routine healing: Secondary | ICD-10-CM | POA: Diagnosis not present

## 2018-07-31 DIAGNOSIS — S22079D Unspecified fracture of T9-T10 vertebra, subsequent encounter for fracture with routine healing: Secondary | ICD-10-CM | POA: Diagnosis not present

## 2018-08-03 ENCOUNTER — Other Ambulatory Visit: Payer: Self-pay | Admitting: Internal Medicine

## 2018-08-03 ENCOUNTER — Telehealth: Payer: Self-pay | Admitting: Behavioral Health

## 2018-08-03 DIAGNOSIS — I130 Hypertensive heart and chronic kidney disease with heart failure and stage 1 through stage 4 chronic kidney disease, or unspecified chronic kidney disease: Secondary | ICD-10-CM | POA: Diagnosis not present

## 2018-08-03 DIAGNOSIS — K661 Hemoperitoneum: Secondary | ICD-10-CM | POA: Diagnosis not present

## 2018-08-03 DIAGNOSIS — Z792 Long term (current) use of antibiotics: Secondary | ICD-10-CM | POA: Diagnosis not present

## 2018-08-03 DIAGNOSIS — I5042 Chronic combined systolic (congestive) and diastolic (congestive) heart failure: Secondary | ICD-10-CM | POA: Diagnosis not present

## 2018-08-03 DIAGNOSIS — S42212D Unspecified displaced fracture of surgical neck of left humerus, subsequent encounter for fracture with routine healing: Secondary | ICD-10-CM | POA: Diagnosis not present

## 2018-08-03 DIAGNOSIS — G9341 Metabolic encephalopathy: Secondary | ICD-10-CM | POA: Diagnosis not present

## 2018-08-03 DIAGNOSIS — S22079D Unspecified fracture of T9-T10 vertebra, subsequent encounter for fracture with routine healing: Secondary | ICD-10-CM | POA: Diagnosis not present

## 2018-08-03 DIAGNOSIS — D631 Anemia in chronic kidney disease: Secondary | ICD-10-CM | POA: Diagnosis not present

## 2018-08-03 DIAGNOSIS — Z5181 Encounter for therapeutic drug level monitoring: Secondary | ICD-10-CM | POA: Diagnosis not present

## 2018-08-03 MED ORDER — CEPHALEXIN 500 MG PO CAPS: 500.0000 mg | ORAL_CAPSULE | Freq: Four times a day (QID) | ORAL | 1 refills | Status: DC

## 2018-08-03 MED ORDER — DOXYCYCLINE HYCLATE 100 MG PO CAPS: 100.0000 mg | ORAL_CAPSULE | Freq: Two times a day (BID) | ORAL | 1 refills | Status: DC

## 2018-08-03 NOTE — Telephone Encounter (Signed)
Called [name] and left voicemail that she had an appointment with Dr. Linus Salmons Monday 08/10/2018 at 10:15.Left offica address on the voicemail as well.  Asked her to call the office back for any questions.  Pricilla Riffle RN

## 2018-08-03 NOTE — Telephone Encounter (Signed)
-----   Message from Thayer Headings, MD sent at 08/03/2018  2:52 PM EST ----- Can you have her follow up with me next Monday the 23rd in the same day slot and let her know.  I have pulled the picc line and transitioned her to oral therapy.   thanks

## 2018-08-03 NOTE — Progress Notes (Signed)
Multiple issues with her picc line and now with discharge, redness after just being replaced.  No fever or chills.  I will transition her to oral treatment with doxycycline and keflex and stop the IV therapy.   I will have her follow up with me next week.

## 2018-08-05 DIAGNOSIS — I5042 Chronic combined systolic (congestive) and diastolic (congestive) heart failure: Secondary | ICD-10-CM | POA: Diagnosis not present

## 2018-08-05 DIAGNOSIS — S42212D Unspecified displaced fracture of surgical neck of left humerus, subsequent encounter for fracture with routine healing: Secondary | ICD-10-CM | POA: Diagnosis not present

## 2018-08-05 DIAGNOSIS — I130 Hypertensive heart and chronic kidney disease with heart failure and stage 1 through stage 4 chronic kidney disease, or unspecified chronic kidney disease: Secondary | ICD-10-CM | POA: Diagnosis not present

## 2018-08-05 DIAGNOSIS — S22079D Unspecified fracture of T9-T10 vertebra, subsequent encounter for fracture with routine healing: Secondary | ICD-10-CM | POA: Diagnosis not present

## 2018-08-05 DIAGNOSIS — K661 Hemoperitoneum: Secondary | ICD-10-CM | POA: Diagnosis not present

## 2018-08-05 DIAGNOSIS — G9341 Metabolic encephalopathy: Secondary | ICD-10-CM | POA: Diagnosis not present

## 2018-08-07 DIAGNOSIS — S42212D Unspecified displaced fracture of surgical neck of left humerus, subsequent encounter for fracture with routine healing: Secondary | ICD-10-CM | POA: Diagnosis not present

## 2018-08-07 DIAGNOSIS — K661 Hemoperitoneum: Secondary | ICD-10-CM | POA: Diagnosis not present

## 2018-08-07 DIAGNOSIS — I5042 Chronic combined systolic (congestive) and diastolic (congestive) heart failure: Secondary | ICD-10-CM | POA: Diagnosis not present

## 2018-08-07 DIAGNOSIS — G9341 Metabolic encephalopathy: Secondary | ICD-10-CM | POA: Diagnosis not present

## 2018-08-07 DIAGNOSIS — S22079D Unspecified fracture of T9-T10 vertebra, subsequent encounter for fracture with routine healing: Secondary | ICD-10-CM | POA: Diagnosis not present

## 2018-08-07 DIAGNOSIS — I130 Hypertensive heart and chronic kidney disease with heart failure and stage 1 through stage 4 chronic kidney disease, or unspecified chronic kidney disease: Secondary | ICD-10-CM | POA: Diagnosis not present

## 2018-08-10 ENCOUNTER — Ambulatory Visit: Payer: Self-pay | Admitting: Internal Medicine

## 2018-08-10 DIAGNOSIS — I130 Hypertensive heart and chronic kidney disease with heart failure and stage 1 through stage 4 chronic kidney disease, or unspecified chronic kidney disease: Secondary | ICD-10-CM | POA: Diagnosis not present

## 2018-08-10 DIAGNOSIS — K661 Hemoperitoneum: Secondary | ICD-10-CM | POA: Diagnosis not present

## 2018-08-10 DIAGNOSIS — B372 Candidiasis of skin and nail: Secondary | ICD-10-CM | POA: Diagnosis not present

## 2018-08-10 DIAGNOSIS — S22079D Unspecified fracture of T9-T10 vertebra, subsequent encounter for fracture with routine healing: Secondary | ICD-10-CM | POA: Diagnosis not present

## 2018-08-10 DIAGNOSIS — Z0000 Encounter for general adult medical examination without abnormal findings: Secondary | ICD-10-CM | POA: Diagnosis not present

## 2018-08-10 DIAGNOSIS — F341 Dysthymic disorder: Secondary | ICD-10-CM | POA: Diagnosis not present

## 2018-08-10 DIAGNOSIS — I10 Essential (primary) hypertension: Secondary | ICD-10-CM | POA: Diagnosis not present

## 2018-08-10 DIAGNOSIS — G894 Chronic pain syndrome: Secondary | ICD-10-CM | POA: Diagnosis not present

## 2018-08-10 DIAGNOSIS — G9341 Metabolic encephalopathy: Secondary | ICD-10-CM | POA: Diagnosis not present

## 2018-08-10 DIAGNOSIS — G061 Intraspinal abscess and granuloma: Secondary | ICD-10-CM | POA: Diagnosis not present

## 2018-08-10 DIAGNOSIS — I509 Heart failure, unspecified: Secondary | ICD-10-CM | POA: Diagnosis not present

## 2018-08-10 DIAGNOSIS — B49 Unspecified mycosis: Secondary | ICD-10-CM | POA: Diagnosis not present

## 2018-08-10 DIAGNOSIS — I5042 Chronic combined systolic (congestive) and diastolic (congestive) heart failure: Secondary | ICD-10-CM | POA: Diagnosis not present

## 2018-08-10 DIAGNOSIS — S42212D Unspecified displaced fracture of surgical neck of left humerus, subsequent encounter for fracture with routine healing: Secondary | ICD-10-CM | POA: Diagnosis not present

## 2018-08-12 ENCOUNTER — Emergency Department (HOSPITAL_COMMUNITY)
Admission: EM | Admit: 2018-08-12 | Discharge: 2018-08-13 | Disposition: A | Discharge status: Home / Self Care | Payer: Medicare Other | Attending: Emergency Medicine | Admitting: Emergency Medicine

## 2018-08-12 ENCOUNTER — Other Ambulatory Visit: Payer: Self-pay

## 2018-08-12 ENCOUNTER — Encounter (HOSPITAL_COMMUNITY): Payer: Self-pay | Admitting: Emergency Medicine

## 2018-08-12 DIAGNOSIS — M5441 Lumbago with sciatica, right side: Secondary | ICD-10-CM

## 2018-08-12 DIAGNOSIS — M545 Low back pain: Secondary | ICD-10-CM | POA: Diagnosis not present

## 2018-08-12 DIAGNOSIS — M4647 Discitis, unspecified, lumbosacral region: Secondary | ICD-10-CM | POA: Diagnosis not present

## 2018-08-12 DIAGNOSIS — E7800 Pure hypercholesterolemia, unspecified: Secondary | ICD-10-CM | POA: Diagnosis not present

## 2018-08-12 DIAGNOSIS — G061 Intraspinal abscess and granuloma: Secondary | ICD-10-CM

## 2018-08-12 DIAGNOSIS — Z79899 Other long term (current) drug therapy: Secondary | ICD-10-CM | POA: Diagnosis not present

## 2018-08-12 DIAGNOSIS — B369 Superficial mycosis, unspecified: Secondary | ICD-10-CM

## 2018-08-12 DIAGNOSIS — Z87891 Personal history of nicotine dependence: Secondary | ICD-10-CM | POA: Insufficient documentation

## 2018-08-12 DIAGNOSIS — E785 Hyperlipidemia, unspecified: Secondary | ICD-10-CM | POA: Diagnosis not present

## 2018-08-12 DIAGNOSIS — L02212 Cutaneous abscess of back [any part, except buttock]: Secondary | ICD-10-CM | POA: Diagnosis not present

## 2018-08-12 DIAGNOSIS — I130 Hypertensive heart and chronic kidney disease with heart failure and stage 1 through stage 4 chronic kidney disease, or unspecified chronic kidney disease: Secondary | ICD-10-CM | POA: Insufficient documentation

## 2018-08-12 DIAGNOSIS — N183 Chronic kidney disease, stage 3 (moderate): Secondary | ICD-10-CM | POA: Insufficient documentation

## 2018-08-12 DIAGNOSIS — R110 Nausea: Secondary | ICD-10-CM | POA: Insufficient documentation

## 2018-08-12 DIAGNOSIS — I5042 Chronic combined systolic (congestive) and diastolic (congestive) heart failure: Secondary | ICD-10-CM | POA: Diagnosis not present

## 2018-08-12 DIAGNOSIS — W19XXXA Unspecified fall, initial encounter: Secondary | ICD-10-CM | POA: Diagnosis not present

## 2018-08-12 DIAGNOSIS — R52 Pain, unspecified: Secondary | ICD-10-CM | POA: Diagnosis not present

## 2018-08-12 DIAGNOSIS — L089 Local infection of the skin and subcutaneous tissue, unspecified: Secondary | ICD-10-CM | POA: Diagnosis not present

## 2018-08-12 DIAGNOSIS — G8929 Other chronic pain: Secondary | ICD-10-CM

## 2018-08-12 NOTE — ED Triage Notes (Addendum)
Pt reports having a fall in October resulting in a fx in left shoulder and somewhere in her back. Pt reports she ran out of pain medicine and has been in pain all day. Pt was taking oxycodone, gabapentin and a muscle relaxer. Pt able to ambulate. Pt has a hx of htn and reports she did not take her medications today.

## 2018-08-13 ENCOUNTER — Emergency Department (HOSPITAL_COMMUNITY): Payer: Medicare Other

## 2018-08-13 DIAGNOSIS — M4647 Discitis, unspecified, lumbosacral region: Secondary | ICD-10-CM | POA: Diagnosis not present

## 2018-08-13 DIAGNOSIS — M545 Low back pain: Secondary | ICD-10-CM | POA: Diagnosis not present

## 2018-08-13 DIAGNOSIS — R5381 Other malaise: Secondary | ICD-10-CM | POA: Diagnosis not present

## 2018-08-13 DIAGNOSIS — Z7401 Bed confinement status: Secondary | ICD-10-CM | POA: Diagnosis not present

## 2018-08-13 DIAGNOSIS — R531 Weakness: Secondary | ICD-10-CM | POA: Diagnosis not present

## 2018-08-13 DIAGNOSIS — M2550 Pain in unspecified joint: Secondary | ICD-10-CM | POA: Diagnosis not present

## 2018-08-13 LAB — CBC WITH DIFFERENTIAL/PLATELET
Abs Immature Granulocytes: 0.05 10*3/uL (ref 0.00–0.07)
BASOS ABS: 0 10*3/uL (ref 0.0–0.1)
Basophils Relative: 0 %
EOS ABS: 0.3 10*3/uL (ref 0.0–0.5)
EOS PCT: 5 %
HEMATOCRIT: 40 % (ref 36.0–46.0)
Hemoglobin: 12.2 g/dL (ref 12.0–15.0)
Immature Granulocytes: 1 %
LYMPHS ABS: 1.4 10*3/uL (ref 0.7–4.0)
LYMPHS PCT: 29 %
MCH: 25.9 pg — ABNORMAL LOW (ref 26.0–34.0)
MCHC: 30.5 g/dL (ref 30.0–36.0)
MCV: 84.9 fL (ref 80.0–100.0)
MONOS PCT: 30 %
Monocytes Absolute: 1.4 10*3/uL — ABNORMAL HIGH (ref 0.1–1.0)
NRBC: 0 % (ref 0.0–0.2)
Neutro Abs: 1.6 10*3/uL — ABNORMAL LOW (ref 1.7–7.7)
Neutrophils Relative %: 35 %
Platelets: 221 10*3/uL (ref 150–400)
RBC: 4.71 MIL/uL (ref 3.87–5.11)
RDW: 16.9 % — AB (ref 11.5–15.5)
WBC: 4.7 10*3/uL (ref 4.0–10.5)

## 2018-08-13 LAB — BASIC METABOLIC PANEL
ANION GAP: 13 (ref 5–15)
BUN: <5 mg/dL — ABNORMAL LOW (ref 8–23)
CHLORIDE: 106 mmol/L (ref 98–111)
CO2: 22 mmol/L (ref 22–32)
CREATININE: 0.89 mg/dL (ref 0.44–1.00)
Calcium: 8.9 mg/dL (ref 8.9–10.3)
GFR calc non Af Amer: >60 mL/min (ref >60)
Glucose, Bld: 111 mg/dL — ABNORMAL HIGH (ref 70–99)
Potassium: 3.5 mmol/L (ref 3.5–5.1)
Sodium: 141 mmol/L (ref 135–145)

## 2018-08-13 MED ORDER — ONDANSETRON HCL 4 MG/2ML IJ SOLN
4.0000 mg | Freq: Once | INTRAMUSCULAR | Status: AC
  Administered 2018-08-13: 4 mg via INTRAVENOUS
  Filled 2018-08-13: qty 2

## 2018-08-13 MED ORDER — ONDANSETRON 4 MG PO TBDP: 4.0000 mg | ORAL_TABLET | Freq: Three times a day (TID) | ORAL | 0 refills | Status: AC | PRN

## 2018-08-13 MED ORDER — NYSTATIN 100000 UNIT/GM EX CREA: TOPICAL_CREAM | Freq: Two times a day (BID) | CUTANEOUS | 1 refills | Status: AC

## 2018-08-13 MED ORDER — HYDROMORPHONE HCL 1 MG/ML IJ SOLN
1.0000 mg | Freq: Once | INTRAMUSCULAR | Status: AC
  Administered 2018-08-13: 1 mg via INTRAVENOUS
  Filled 2018-08-13: qty 1

## 2018-08-13 MED ORDER — OXYCODONE HCL 10 MG PO TABS: 10.0000 mg | ORAL_TABLET | Freq: Four times a day (QID) | ORAL | 0 refills | Status: AC | PRN

## 2018-08-13 MED ORDER — OXYCODONE HCL 5 MG PO TABS
10.0000 mg | ORAL_TABLET | Freq: Once | ORAL | Status: AC
  Administered 2018-08-13: 10 mg via ORAL
  Filled 2018-08-13: qty 2

## 2018-08-13 MED ORDER — GADOBUTROL 1 MMOL/ML IV SOLN
10.0000 mL | Freq: Once | INTRAVENOUS | Status: AC | PRN
  Administered 2018-08-13: 10 mL via INTRAVENOUS

## 2018-08-13 NOTE — ED Notes (Signed)
Report given to [facility] for transport.

## 2018-08-13 NOTE — ED Notes (Signed)
Patient transported to MRI 
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
## 2018-08-13 NOTE — ED Notes (Signed)
Nurse starting IV and will draw labs. 

## 2018-08-13 NOTE — ED Notes (Signed)
Called [facility] for pt transport  

## 2018-08-13 NOTE — ED Notes (Signed)
Discharge instructions reviewed with patient. Pt verbalized understanding.

## 2018-08-14 ENCOUNTER — Encounter: Payer: Medicare Other | Admitting: Physical Medicine & Rehabilitation

## 2018-08-14 ENCOUNTER — Telehealth: Payer: Self-pay

## 2018-08-14 DIAGNOSIS — S22079D Unspecified fracture of T9-T10 vertebra, subsequent encounter for fracture with routine healing: Secondary | ICD-10-CM | POA: Diagnosis not present

## 2018-08-14 DIAGNOSIS — K661 Hemoperitoneum: Secondary | ICD-10-CM | POA: Diagnosis not present

## 2018-08-14 DIAGNOSIS — S42212D Unspecified displaced fracture of surgical neck of left humerus, subsequent encounter for fracture with routine healing: Secondary | ICD-10-CM | POA: Diagnosis not present

## 2018-08-14 DIAGNOSIS — G9341 Metabolic encephalopathy: Secondary | ICD-10-CM | POA: Diagnosis not present

## 2018-08-14 DIAGNOSIS — I130 Hypertensive heart and chronic kidney disease with heart failure and stage 1 through stage 4 chronic kidney disease, or unspecified chronic kidney disease: Secondary | ICD-10-CM | POA: Diagnosis not present

## 2018-08-14 DIAGNOSIS — I5042 Chronic combined systolic (congestive) and diastolic (congestive) heart failure: Secondary | ICD-10-CM | POA: Diagnosis not present

## 2018-08-14 NOTE — Telephone Encounter (Signed)
Received call from Amy with [facility] [phone]). Stating patient is not tolerated current antibiotics ( Cephalexin 500mg  QID and Doxycycline 100mg  BID). States medication is making her "gag/vomit"  Patient is unsure of which medication is causing this. Per Amy patient is also requesting medication for yeast infection.  This nurse routing message to Dr. Linus Salmons for advise. [facility] also getting [facility] services set up for patient due to transportation issues. Patient will call to reschedule hospital follow up appointment.

## 2018-08-18 LAB — CULTURE, BLOOD (ROUTINE X 2)
Culture: NO GROWTH
Culture: NO GROWTH
SPECIAL REQUESTS: ADEQUATE
Special Requests: ADEQUATE

## 2018-08-19 DIAGNOSIS — G9341 Metabolic encephalopathy: Secondary | ICD-10-CM | POA: Diagnosis not present

## 2018-08-19 DIAGNOSIS — K661 Hemoperitoneum: Secondary | ICD-10-CM | POA: Diagnosis not present

## 2018-08-19 DIAGNOSIS — I10 Essential (primary) hypertension: Secondary | ICD-10-CM | POA: Diagnosis not present

## 2018-08-19 DIAGNOSIS — S22079D Unspecified fracture of T9-T10 vertebra, subsequent encounter for fracture with routine healing: Secondary | ICD-10-CM | POA: Diagnosis not present

## 2018-08-19 DIAGNOSIS — I130 Hypertensive heart and chronic kidney disease with heart failure and stage 1 through stage 4 chronic kidney disease, or unspecified chronic kidney disease: Secondary | ICD-10-CM | POA: Diagnosis not present

## 2018-08-19 DIAGNOSIS — I5042 Chronic combined systolic (congestive) and diastolic (congestive) heart failure: Secondary | ICD-10-CM | POA: Diagnosis not present

## 2018-08-19 DIAGNOSIS — S42212D Unspecified displaced fracture of surgical neck of left humerus, subsequent encounter for fracture with routine healing: Secondary | ICD-10-CM | POA: Diagnosis not present

## 2018-08-19 DIAGNOSIS — I509 Heart failure, unspecified: Secondary | ICD-10-CM | POA: Diagnosis not present

## 2018-08-20 ENCOUNTER — Other Ambulatory Visit: Payer: Self-pay | Admitting: Internal Medicine

## 2018-08-20 MED ORDER — FLUCONAZOLE 200 MG PO TABS: 200.0000 mg | ORAL_TABLET | Freq: Every day | ORAL | 1 refills | Status: DC

## 2018-08-20 NOTE — Telephone Encounter (Signed)
Probably doxy, can just stop that and continue with cephalexin.

## 2018-08-20 NOTE — Telephone Encounter (Signed)
Regal with [facility] and made her aware of medication changes per Dr. Linus Salmons to discontinue Doxy and continue with cephalexin. Also received order to start Fulconazole 200mg  PO x 5 days.

## 2018-08-21 DIAGNOSIS — I130 Hypertensive heart and chronic kidney disease with heart failure and stage 1 through stage 4 chronic kidney disease, or unspecified chronic kidney disease: Secondary | ICD-10-CM | POA: Diagnosis not present

## 2018-08-21 DIAGNOSIS — I5042 Chronic combined systolic (congestive) and diastolic (congestive) heart failure: Secondary | ICD-10-CM | POA: Diagnosis not present

## 2018-08-21 DIAGNOSIS — G9341 Metabolic encephalopathy: Secondary | ICD-10-CM | POA: Diagnosis not present

## 2018-08-21 DIAGNOSIS — S42212D Unspecified displaced fracture of surgical neck of left humerus, subsequent encounter for fracture with routine healing: Secondary | ICD-10-CM | POA: Diagnosis not present

## 2018-08-21 DIAGNOSIS — K661 Hemoperitoneum: Secondary | ICD-10-CM | POA: Diagnosis not present

## 2018-08-21 DIAGNOSIS — S22079D Unspecified fracture of T9-T10 vertebra, subsequent encounter for fracture with routine healing: Secondary | ICD-10-CM | POA: Diagnosis not present

## 2018-09-04 ENCOUNTER — Encounter: Payer: Self-pay | Admitting: Physical Medicine & Rehabilitation

## 2018-09-04 ENCOUNTER — Other Ambulatory Visit: Payer: Self-pay

## 2018-09-04 ENCOUNTER — Encounter: Payer: Medicare Other | Attending: Physical Medicine & Rehabilitation | Admitting: Physical Medicine & Rehabilitation

## 2018-09-04 VITALS — BP 167/95 | HR 74 | Ht 63.0 in | Wt 264.4 lb

## 2018-09-04 DIAGNOSIS — T8149XA Infection following a procedure, other surgical site, initial encounter: Secondary | ICD-10-CM | POA: Diagnosis not present

## 2018-09-04 DIAGNOSIS — G062 Extradural and subdural abscess, unspecified: Secondary | ICD-10-CM | POA: Insufficient documentation

## 2018-09-04 DIAGNOSIS — I10 Essential (primary) hypertension: Secondary | ICD-10-CM | POA: Insufficient documentation

## 2018-09-04 DIAGNOSIS — M5417 Radiculopathy, lumbosacral region: Secondary | ICD-10-CM | POA: Diagnosis not present

## 2018-09-04 DIAGNOSIS — G8918 Other acute postprocedural pain: Secondary | ICD-10-CM | POA: Insufficient documentation

## 2018-09-04 DIAGNOSIS — S22079S Unspecified fracture of T9-T10 vertebra, sequela: Secondary | ICD-10-CM | POA: Diagnosis not present

## 2018-09-04 DIAGNOSIS — G894 Chronic pain syndrome: Secondary | ICD-10-CM | POA: Diagnosis not present

## 2018-09-04 DIAGNOSIS — I5032 Chronic diastolic (congestive) heart failure: Secondary | ICD-10-CM | POA: Diagnosis not present

## 2018-09-04 DIAGNOSIS — R5381 Other malaise: Secondary | ICD-10-CM

## 2018-09-04 DIAGNOSIS — S42202S Unspecified fracture of upper end of left humerus, sequela: Secondary | ICD-10-CM | POA: Diagnosis not present
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
## 2018-09-06 ENCOUNTER — Other Ambulatory Visit (HOSPITAL_COMMUNITY): Payer: Self-pay | Admitting: Physical Medicine and Rehabilitation

## 2018-09-29 ENCOUNTER — Ambulatory Visit (INDEPENDENT_AMBULATORY_CARE_PROVIDER_SITE_OTHER): Payer: Medicare Other | Admitting: Internal Medicine

## 2018-09-29 ENCOUNTER — Encounter: Payer: Self-pay | Admitting: Internal Medicine

## 2018-09-29 VITALS — BP 151/97 | HR 77 | Temp 97.8°F | Ht 63.0 in | Wt 267.5 lb

## 2018-09-29 DIAGNOSIS — S42212D Unspecified displaced fracture of surgical neck of left humerus, subsequent encounter for fracture with routine healing: Secondary | ICD-10-CM | POA: Diagnosis not present

## 2018-09-29 DIAGNOSIS — S22079S Unspecified fracture of T9-T10 vertebra, sequela: Secondary | ICD-10-CM | POA: Diagnosis not present

## 2018-09-29 DIAGNOSIS — G061 Intraspinal abscess and granuloma: Secondary | ICD-10-CM

## 2018-09-29 DIAGNOSIS — S42212A Unspecified displaced fracture of surgical neck of left humerus, initial encounter for closed fracture: Secondary | ICD-10-CM

## 2018-09-29 DIAGNOSIS — Z87891 Personal history of nicotine dependence: Secondary | ICD-10-CM | POA: Diagnosis not present

## 2018-09-29 LAB — SEDIMENTATION RATE: Sed Rate: 22 mm/h (ref 0–30)

## 2018-09-29 NOTE — Progress Notes (Signed)
   Subjective:    Patient ID: [name], female    DOB: 26-Oct-1950, 68 y.o.   MRN: [id number]  HPI She is here for follow-up of discitis with osteomyelitis.  She has a history of lumbosacral radiculopathy and last year had concerns for discitis and positive blood culture and treated with with prolonged IV antibiotics.  She improved but then presented again in November 2019 with worsening back pain after a T10 vertebral fracture and a left humerus fracture.  There was concerns for persistent discitis and her sed rate was elevated so I decided to put hot her on another 8-week course of IV ceftriaxone.  She had issues with her PICC line which had to be removed and I had her continue with doxycycline and Keflex.  She did not tolerate the doxycycline and continue with Keflex but never return to clinic. She only took those a few days due to intolerance.  She did have a follow-up MRI in an emergency room visit which did show good resolution of her previous infection.   Review of Systems  Constitutional: Negative for chills and fever.  Gastrointestinal: Negative for diarrhea.  Skin: Negative for rash.       Objective:   Physical Exam Constitutional:      Appearance: Normal appearance.  Eyes:     General: No scleral icterus. Cardiovascular:     Rate and Rhythm: Normal rate and regular rhythm.     Heart sounds: No murmur.  Pulmonary:     Effort: Pulmonary effort is normal.     Breath sounds: Normal breath sounds.  Skin:    Findings: No rash.  Neurological:     Mental Status: She is alert.   SH: no tobacco        Assessment & Plan:

## 2018-09-29 NOTE — Assessment & Plan Note (Signed)
I encouraged her to go back to Dr. Ronnald Ramp for follow up

## 2018-09-29 NOTE — Assessment & Plan Note (Signed)
I encouaraged her to follow up with her orthopedist.

## 2018-09-29 NOTE — Assessment & Plan Note (Signed)
She completed a long duration of therapy, though was not a complete duration and did not take oral continuation therapy.  She though has had a follow up MRI and is much improved and now over 2 months off of antibiotics and doing well so no indication for further treatment at this time.  No signs of active infection.  I will check an ESR to assure no significant elevation over 100 though.

## 2018-09-30 ENCOUNTER — Telehealth: Payer: Self-pay

## 2018-09-30 NOTE — Telephone Encounter (Signed)
Patient returned call requesting lab results.  Informed patient that her ESR looks really good, Dr Linus Salmons has no concerns in regards to her previous back infection and it appears the infection is resolved.  Patient verbalized understanding and was appreciative of the call. Candace Riffle RN

## 2018-09-30 NOTE — Telephone Encounter (Signed)
-----  Message from Thayer Headings, MD sent at 09/30/2018  2:49 PM EST ----- Please let her know that her ESR looks really good, normal, no concerns in regards to her previous back infection, seems resolved.  thanks

## 2018-09-30 NOTE — Telephone Encounter (Signed)
Attempted to call patient to review lab results. Patient is unavailable at this time. Left message with results and call back number If patient had any questions.  [place]

## 2018-10-08 DIAGNOSIS — F418 Other specified anxiety disorders: Secondary | ICD-10-CM | POA: Diagnosis not present

## 2018-10-08 DIAGNOSIS — E7849 Other hyperlipidemia: Secondary | ICD-10-CM | POA: Diagnosis not present

## 2018-10-08 DIAGNOSIS — M545 Low back pain: Secondary | ICD-10-CM | POA: Diagnosis not present

## 2018-10-08 DIAGNOSIS — Z1331 Encounter for screening for depression: Secondary | ICD-10-CM | POA: Diagnosis not present

## 2018-10-08 DIAGNOSIS — Z6841 Body Mass Index (BMI) 40.0 and over, adult: Secondary | ICD-10-CM | POA: Diagnosis not present

## 2018-10-08 DIAGNOSIS — R2681 Unsteadiness on feet: Secondary | ICD-10-CM | POA: Diagnosis not present

## 2018-10-08 DIAGNOSIS — M25512 Pain in left shoulder: Secondary | ICD-10-CM | POA: Diagnosis not present

## 2018-10-08 DIAGNOSIS — I10 Essential (primary) hypertension: Secondary | ICD-10-CM | POA: Diagnosis not present

## 2018-10-08 DIAGNOSIS — R7301 Impaired fasting glucose: Secondary | ICD-10-CM | POA: Diagnosis not present

## 2018-10-24 ENCOUNTER — Emergency Department (HOSPITAL_COMMUNITY): Payer: Medicare Other

## 2018-10-24 ENCOUNTER — Inpatient Hospital Stay (HOSPITAL_COMMUNITY)
Admission: EM | Admit: 2018-10-24 | Discharge: 2018-11-02 | DRG: 193 | Disposition: A | Discharge status: Home Health | Payer: Medicare Other | Attending: Internal Medicine | Admitting: Internal Medicine

## 2018-10-24 ENCOUNTER — Encounter (HOSPITAL_COMMUNITY): Payer: Self-pay | Admitting: Emergency Medicine

## 2018-10-24 DIAGNOSIS — Z91012 Allergy to eggs: Secondary | ICD-10-CM

## 2018-10-24 DIAGNOSIS — R0602 Shortness of breath: Secondary | ICD-10-CM

## 2018-10-24 DIAGNOSIS — Z91018 Allergy to other foods: Secondary | ICD-10-CM

## 2018-10-24 DIAGNOSIS — I959 Hypotension, unspecified: Secondary | ICD-10-CM | POA: Diagnosis not present

## 2018-10-24 DIAGNOSIS — I5033 Acute on chronic diastolic (congestive) heart failure: Secondary | ICD-10-CM | POA: Diagnosis not present

## 2018-10-24 DIAGNOSIS — Z23 Encounter for immunization: Secondary | ICD-10-CM

## 2018-10-24 DIAGNOSIS — Z8249 Family history of ischemic heart disease and other diseases of the circulatory system: Secondary | ICD-10-CM

## 2018-10-24 DIAGNOSIS — Z87891 Personal history of nicotine dependence: Secondary | ICD-10-CM

## 2018-10-24 DIAGNOSIS — Z888 Allergy status to other drugs, medicaments and biological substances status: Secondary | ICD-10-CM

## 2018-10-24 DIAGNOSIS — M5489 Other dorsalgia: Secondary | ICD-10-CM | POA: Diagnosis not present

## 2018-10-24 DIAGNOSIS — Z96653 Presence of artificial knee joint, bilateral: Secondary | ICD-10-CM | POA: Diagnosis present

## 2018-10-24 DIAGNOSIS — Z6841 Body Mass Index (BMI) 40.0 and over, adult: Secondary | ICD-10-CM

## 2018-10-24 DIAGNOSIS — Z882 Allergy status to sulfonamides status: Secondary | ICD-10-CM

## 2018-10-24 DIAGNOSIS — Z96611 Presence of right artificial shoulder joint: Secondary | ICD-10-CM | POA: Diagnosis not present

## 2018-10-24 DIAGNOSIS — R531 Weakness: Secondary | ICD-10-CM | POA: Diagnosis not present

## 2018-10-24 DIAGNOSIS — N183 Chronic kidney disease, stage 3 (moderate): Secondary | ICD-10-CM | POA: Diagnosis not present

## 2018-10-24 DIAGNOSIS — I10 Essential (primary) hypertension: Secondary | ICD-10-CM | POA: Diagnosis present

## 2018-10-24 DIAGNOSIS — J189 Pneumonia, unspecified organism: Secondary | ICD-10-CM

## 2018-10-24 DIAGNOSIS — J440 Chronic obstructive pulmonary disease with acute lower respiratory infection: Secondary | ICD-10-CM | POA: Diagnosis not present

## 2018-10-24 DIAGNOSIS — R05 Cough: Secondary | ICD-10-CM | POA: Diagnosis not present

## 2018-10-24 DIAGNOSIS — E785 Hyperlipidemia, unspecified: Secondary | ICD-10-CM | POA: Diagnosis present

## 2018-10-24 DIAGNOSIS — Z885 Allergy status to narcotic agent status: Secondary | ICD-10-CM

## 2018-10-24 DIAGNOSIS — B9781 Human metapneumovirus as the cause of diseases classified elsewhere: Secondary | ICD-10-CM | POA: Diagnosis present

## 2018-10-24 DIAGNOSIS — T502X5A Adverse effect of carbonic-anhydrase inhibitors, benzothiadiazides and other diuretics, initial encounter: Secondary | ICD-10-CM | POA: Diagnosis not present

## 2018-10-24 DIAGNOSIS — F329 Major depressive disorder, single episode, unspecified: Secondary | ICD-10-CM | POA: Diagnosis present

## 2018-10-24 DIAGNOSIS — I130 Hypertensive heart and chronic kidney disease with heart failure and stage 1 through stage 4 chronic kidney disease, or unspecified chronic kidney disease: Secondary | ICD-10-CM | POA: Diagnosis present

## 2018-10-24 DIAGNOSIS — Z886 Allergy status to analgesic agent status: Secondary | ICD-10-CM | POA: Diagnosis not present

## 2018-10-24 DIAGNOSIS — J181 Lobar pneumonia, unspecified organism: Secondary | ICD-10-CM | POA: Diagnosis not present

## 2018-10-24 DIAGNOSIS — K219 Gastro-esophageal reflux disease without esophagitis: Secondary | ICD-10-CM | POA: Diagnosis not present

## 2018-10-24 DIAGNOSIS — M545 Low back pain: Secondary | ICD-10-CM | POA: Diagnosis not present

## 2018-10-24 DIAGNOSIS — R52 Pain, unspecified: Secondary | ICD-10-CM | POA: Diagnosis not present

## 2018-10-24 DIAGNOSIS — G8929 Other chronic pain: Secondary | ICD-10-CM | POA: Diagnosis present

## 2018-10-24 DIAGNOSIS — J129 Viral pneumonia, unspecified: Principal | ICD-10-CM | POA: Diagnosis present

## 2018-10-24 DIAGNOSIS — K589 Irritable bowel syndrome without diarrhea: Secondary | ICD-10-CM | POA: Diagnosis present

## 2018-10-24 DIAGNOSIS — Z803 Family history of malignant neoplasm of breast: Secondary | ICD-10-CM

## 2018-10-24 DIAGNOSIS — E7800 Pure hypercholesterolemia, unspecified: Secondary | ICD-10-CM | POA: Diagnosis present

## 2018-10-24 DIAGNOSIS — Z881 Allergy status to other antibiotic agents status: Secondary | ICD-10-CM

## 2018-10-24 DIAGNOSIS — M159 Polyosteoarthritis, unspecified: Secondary | ICD-10-CM | POA: Diagnosis present

## 2018-10-24 DIAGNOSIS — Z91041 Radiographic dye allergy status: Secondary | ICD-10-CM

## 2018-10-24 DIAGNOSIS — N182 Chronic kidney disease, stage 2 (mild): Secondary | ICD-10-CM

## 2018-10-24 DIAGNOSIS — E876 Hypokalemia: Secondary | ICD-10-CM | POA: Diagnosis not present

## 2018-10-24 LAB — I-STAT TROPONIN, ED: Troponin i, poc: 0.01 ng/mL (ref 0.00–0.08)

## 2018-10-24 LAB — COMPREHENSIVE METABOLIC PANEL
ALT: 12 U/L (ref 0–44)
AST: 18 U/L (ref 15–41)
Albumin: 3.5 g/dL (ref 3.5–5.0)
Alkaline Phosphatase: 63 U/L (ref 38–126)
Anion gap: 9 (ref 5–15)
BUN: 6 mg/dL — ABNORMAL LOW (ref 8–23)
CO2: 19 mmol/L — ABNORMAL LOW (ref 22–32)
Calcium: 8.3 mg/dL — ABNORMAL LOW (ref 8.9–10.3)
Chloride: 108 mmol/L (ref 98–111)
Creatinine, Ser: 1.12 mg/dL — ABNORMAL HIGH (ref 0.44–1.00)
GFR calc Af Amer: 58 mL/min — ABNORMAL LOW (ref >60)
GFR calc non Af Amer: 50 mL/min — ABNORMAL LOW (ref >60)
Glucose, Bld: 107 mg/dL — ABNORMAL HIGH (ref 70–99)
Potassium: 3.6 mmol/L (ref 3.5–5.1)
Sodium: 136 mmol/L (ref 135–145)
TOTAL PROTEIN: 6.7 g/dL (ref 6.5–8.1)
Total Bilirubin: 0.3 mg/dL (ref 0.3–1.2)

## 2018-10-24 LAB — POCT I-STAT EG7
Acid-Base Excess: 1 mmol/L (ref 0.0–2.0)
Bicarbonate: 27.2 mmol/L (ref 20.0–28.0)
Calcium, Ion: 1.09 mmol/L — ABNORMAL LOW (ref 1.15–1.40)
HCT: 39 % (ref 36.0–46.0)
Hemoglobin: 13.3 g/dL (ref 12.0–15.0)
O2 Saturation: 91 %
POTASSIUM: 4.4 mmol/L (ref 3.5–5.1)
SODIUM: 141 mmol/L (ref 135–145)
TCO2: 29 mmol/L (ref 22–32)
pCO2, Ven: 46.6 mmHg (ref 44.0–60.0)
pH, Ven: 7.375 (ref 7.250–7.430)
pO2, Ven: 62 mmHg — ABNORMAL HIGH (ref 32.0–45.0)

## 2018-10-24 LAB — CBC WITH DIFFERENTIAL/PLATELET
Abs Immature Granulocytes: 0.01 10*3/uL (ref 0.00–0.07)
BASOS ABS: 0 10*3/uL (ref 0.0–0.1)
BASOS PCT: 0 %
Eosinophils Absolute: 0.1 10*3/uL (ref 0.0–0.5)
Eosinophils Relative: 3 %
HCT: 43.2 % (ref 36.0–46.0)
Hemoglobin: 12.7 g/dL (ref 12.0–15.0)
Immature Granulocytes: 0 %
Lymphocytes Relative: 24 %
Lymphs Abs: 0.9 10*3/uL (ref 0.7–4.0)
MCH: 25.5 pg — ABNORMAL LOW (ref 26.0–34.0)
MCHC: 29.4 g/dL — ABNORMAL LOW (ref 30.0–36.0)
MCV: 86.6 fL (ref 80.0–100.0)
Monocytes Absolute: 1.8 10*3/uL — ABNORMAL HIGH (ref 0.1–1.0)
Monocytes Relative: 47 %
Neutro Abs: 1 10*3/uL — ABNORMAL LOW (ref 1.7–7.7)
Neutrophils Relative %: 26 %
PLATELETS: DECREASED 10*3/uL (ref 150–400)
RBC: 4.99 MIL/uL (ref 3.87–5.11)
RDW: 15.3 % (ref 11.5–15.5)
WBC: 3.7 10*3/uL — AB (ref 4.0–10.5)
nRBC: 0 % (ref 0.0–0.2)

## 2018-10-24 LAB — PROTIME-INR
INR: 1 (ref 0.8–1.2)
Prothrombin Time: 13.2 seconds (ref 11.4–15.2)

## 2018-10-24 LAB — LACTIC ACID, PLASMA: LACTIC ACID, VENOUS: 1.1 mmol/L (ref 0.5–1.9)

## 2018-10-24 LAB — INFLUENZA PANEL BY PCR (TYPE A & B)
Influenza A By PCR: NEGATIVE
Influenza B By PCR: NEGATIVE

## 2018-10-24 LAB — I-STAT CREATININE, ED: Creatinine, Ser: 1.2 mg/dL — ABNORMAL HIGH (ref 0.44–1.00)

## 2018-10-24 MED ORDER — HEPARIN SODIUM (PORCINE) 5000 UNIT/ML IJ SOLN
5000.0000 [IU] | Freq: Three times a day (TID) | INTRAMUSCULAR | Status: DC
  Administered 2018-10-24 – 2018-10-27 (×8): 5000 [IU] via SUBCUTANEOUS
  Filled 2018-10-24 (×8): qty 1

## 2018-10-24 MED ORDER — SODIUM CHLORIDE 0.9 % IV SOLN
1.0000 g | INTRAVENOUS | Status: DC
  Administered 2018-10-25: 1 g via INTRAVENOUS
  Filled 2018-10-24 (×2): qty 10

## 2018-10-24 MED ORDER — GABAPENTIN 300 MG PO CAPS
600.0000 mg | ORAL_CAPSULE | Freq: Three times a day (TID) | ORAL | Status: DC
  Administered 2018-10-24 – 2018-11-02 (×26): 600 mg via ORAL
  Filled 2018-10-24 (×26): qty 2

## 2018-10-24 MED ORDER — CLONIDINE HCL 0.3 MG PO TABS: 0.3000 mg | ORAL_TABLET | Freq: Every day | ORAL | Status: DC

## 2018-10-24 MED ORDER — SODIUM CHLORIDE 0.9 % IV SOLN
500.0000 mg | INTRAVENOUS | Status: DC
  Administered 2018-10-24 – 2018-10-26 (×3): 500 mg via INTRAVENOUS
  Filled 2018-10-24 (×3): qty 500

## 2018-10-24 MED ORDER — DIPHENHYDRAMINE HCL 25 MG PO CAPS
25.0000 mg | ORAL_CAPSULE | Freq: Four times a day (QID) | ORAL | Status: DC | PRN
  Administered 2018-10-27 – 2018-10-31 (×3): 25 mg via ORAL
  Filled 2018-10-24 (×3): qty 1

## 2018-10-24 MED ORDER — SODIUM CHLORIDE 0.9 % IV SOLN
2.0000 g | INTRAVENOUS | Status: DC
  Administered 2018-10-24 – 2018-10-27 (×4): 2 g via INTRAVENOUS
  Filled 2018-10-24 (×5): qty 20

## 2018-10-24 MED ORDER — METOPROLOL TARTRATE 25 MG PO TABS
25.0000 mg | ORAL_TABLET | Freq: Every day | ORAL | Status: DC
  Administered 2018-10-25 – 2018-11-02 (×9): 25 mg via ORAL
  Filled 2018-10-24 (×9): qty 1

## 2018-10-24 MED ORDER — METHOCARBAMOL 750 MG PO TABS
750.0000 mg | ORAL_TABLET | Freq: Three times a day (TID) | ORAL | Status: DC
  Administered 2018-10-24 – 2018-11-02 (×26): 750 mg via ORAL
  Filled 2018-10-24 (×4): qty 2
  Filled 2018-10-24: qty 1
  Filled 2018-10-24 (×2): qty 2
  Filled 2018-10-24: qty 1
  Filled 2018-10-24 (×2): qty 2
  Filled 2018-10-24 (×2): qty 1
  Filled 2018-10-24 (×5): qty 2
  Filled 2018-10-24: qty 1
  Filled 2018-10-24: qty 2
  Filled 2018-10-24 (×3): qty 1
  Filled 2018-10-24 (×4): qty 2

## 2018-10-24 MED ORDER — SODIUM CHLORIDE 0.9 % IV BOLUS
1000.0000 mL | Freq: Once | INTRAVENOUS | Status: AC
  Administered 2018-10-24: 1000 mL via INTRAVENOUS

## 2018-10-24 MED ORDER — PANTOPRAZOLE SODIUM 40 MG PO TBEC
40.0000 mg | DELAYED_RELEASE_TABLET | Freq: Every day | ORAL | Status: DC
  Administered 2018-10-25 – 2018-11-02 (×9): 40 mg via ORAL
  Filled 2018-10-24 (×9): qty 1

## 2018-10-24 MED ORDER — AZITHROMYCIN 250 MG PO TABS: 500.0000 mg | ORAL_TABLET | Freq: Once | ORAL | Status: DC

## 2018-10-24 MED ORDER — DOXYCYCLINE HYCLATE 100 MG PO TABS
100.0000 mg | ORAL_TABLET | Freq: Once | ORAL | Status: AC
  Administered 2018-10-24: 100 mg via ORAL
  Filled 2018-10-24: qty 1

## 2018-10-24 MED ORDER — CITALOPRAM HYDROBROMIDE 20 MG PO TABS
20.0000 mg | ORAL_TABLET | Freq: Every day | ORAL | Status: DC
  Administered 2018-10-25 – 2018-11-02 (×9): 20 mg via ORAL
  Filled 2018-10-24 (×9): qty 1

## 2018-10-24 MED ORDER — PNEUMOCOCCAL VAC POLYVALENT 25 MCG/0.5ML IJ INJ
0.5000 mL | INJECTION | INTRAMUSCULAR | Status: AC
  Administered 2018-10-27: 0.5 mL via INTRAMUSCULAR
  Filled 2018-10-24: qty 0.5

## 2018-10-24 MED ORDER — SODIUM CHLORIDE 0.9 % IV SOLN
INTRAVENOUS | Status: DC
  Administered 2018-10-24: via INTRAVENOUS

## 2018-10-24 MED ORDER — FUROSEMIDE 20 MG PO TABS: 20.0000 mg | ORAL_TABLET | Freq: Every day | ORAL | Status: DC

## 2018-10-24 MED ORDER — LOSARTAN POTASSIUM 50 MG PO TABS: 50.0000 mg | ORAL_TABLET | Freq: Every day | ORAL | Status: DC
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
## 2018-10-24 NOTE — ED Triage Notes (Signed)
Pt reports weakness and lethargy that started today, she "just doesn't feel right".  Negative for stroke symptoms with EMS. Pt reports productive cough x few days.

## 2018-10-24 NOTE — ED Notes (Signed)
D/t inability to by multiple staff including MD, IV team to collect blood cultures antibiotics initiated w/ Dr. Kallie Edward approval.

## 2018-10-25 ENCOUNTER — Other Ambulatory Visit: Payer: Self-pay

## 2018-10-25 DIAGNOSIS — J189 Pneumonia, unspecified organism: Secondary | ICD-10-CM

## 2018-10-25 DIAGNOSIS — I959 Hypotension, unspecified: Secondary | ICD-10-CM

## 2018-10-25 LAB — URINALYSIS, ROUTINE W REFLEX MICROSCOPIC
Bilirubin Urine: NEGATIVE
GLUCOSE, UA: NEGATIVE mg/dL
HGB URINE DIPSTICK: NEGATIVE
Ketones, ur: NEGATIVE mg/dL
NITRITE: NEGATIVE
Protein, ur: 30 mg/dL — AB
Specific Gravity, Urine: 1.027 (ref 1.005–1.030)
pH: 5 (ref 5.0–8.0)

## 2018-10-25 LAB — HIV ANTIBODY (ROUTINE TESTING W REFLEX): HIV Screen 4th Generation wRfx: NONREACTIVE

## 2018-10-25 LAB — CBC
HCT: 37.6 % (ref 36.0–46.0)
HEMOGLOBIN: 11.5 g/dL — AB (ref 12.0–15.0)
MCH: 26.2 pg (ref 26.0–34.0)
MCHC: 30.6 g/dL (ref 30.0–36.0)
MCV: 85.6 fL (ref 80.0–100.0)
Platelets: DECREASED 10*3/uL (ref 150–400)
RBC: 4.39 MIL/uL (ref 3.87–5.11)
RDW: 15.1 % (ref 11.5–15.5)
WBC: 3.9 10*3/uL — ABNORMAL LOW (ref 4.0–10.5)
nRBC: 0 % (ref 0.0–0.2)

## 2018-10-25 LAB — COMPREHENSIVE METABOLIC PANEL
ALT: 11 U/L (ref 0–44)
ANION GAP: 5 (ref 5–15)
AST: 16 U/L (ref 15–41)
Albumin: 3 g/dL — ABNORMAL LOW (ref 3.5–5.0)
Alkaline Phosphatase: 57 U/L (ref 38–126)
BUN: 5 mg/dL — ABNORMAL LOW (ref 8–23)
CO2: 23 mmol/L (ref 22–32)
Calcium: 7.8 mg/dL — ABNORMAL LOW (ref 8.9–10.3)
Chloride: 113 mmol/L — ABNORMAL HIGH (ref 98–111)
Creatinine, Ser: 1.13 mg/dL — ABNORMAL HIGH (ref 0.44–1.00)
GFR calc Af Amer: 57 mL/min — ABNORMAL LOW (ref >60)
GFR calc non Af Amer: 50 mL/min — ABNORMAL LOW (ref >60)
Glucose, Bld: 100 mg/dL — ABNORMAL HIGH (ref 70–99)
Potassium: 3.6 mmol/L (ref 3.5–5.1)
Sodium: 141 mmol/L (ref 135–145)
Total Bilirubin: 0.5 mg/dL (ref 0.3–1.2)
Total Protein: 6 g/dL — ABNORMAL LOW (ref 6.5–8.1)

## 2018-10-25 LAB — LACTIC ACID, PLASMA: Lactic Acid, Venous: 1.2 mmol/L (ref 0.5–1.9)

## 2018-10-25 LAB — STREP PNEUMONIAE URINARY ANTIGEN: Strep Pneumo Urinary Antigen: NEGATIVE

## 2018-10-25 MED ORDER — ALBUTEROL SULFATE (2.5 MG/3ML) 0.083% IN NEBU
2.5000 mg | INHALATION_SOLUTION | Freq: Three times a day (TID) | RESPIRATORY_TRACT | Status: DC
  Administered 2018-10-25: 2.5 mg via RESPIRATORY_TRACT
  Filled 2018-10-25: qty 3

## 2018-10-25 MED ORDER — IPRATROPIUM-ALBUTEROL 0.5-2.5 (3) MG/3ML IN SOLN
3.0000 mL | Freq: Once | RESPIRATORY_TRACT | Status: AC
  Administered 2018-10-26: 3 mL via RESPIRATORY_TRACT
  Filled 2018-10-25: qty 3

## 2018-10-25 MED ORDER — ACETAMINOPHEN 325 MG PO TABS
325.0000 mg | ORAL_TABLET | Freq: Once | ORAL | Status: AC
  Administered 2018-10-26: 325 mg via ORAL
  Filled 2018-10-25: qty 1

## 2018-10-25 MED ORDER — HYDROCODONE-HOMATROPINE 5-1.5 MG/5ML PO SYRP
5.0000 mL | ORAL_SOLUTION | Freq: Four times a day (QID) | ORAL | Status: DC | PRN
  Administered 2018-10-27 – 2018-10-28 (×2): 5 mL via ORAL
  Filled 2018-10-25 (×2): qty 5

## 2018-10-25 MED ORDER — ALBUTEROL SULFATE (2.5 MG/3ML) 0.083% IN NEBU
2.5000 mg | INHALATION_SOLUTION | Freq: Three times a day (TID) | RESPIRATORY_TRACT | Status: DC
  Administered 2018-10-25 – 2018-10-26 (×3): 2.5 mg via RESPIRATORY_TRACT
  Filled 2018-10-25 (×3): qty 3

## 2018-10-25 MED ORDER — ACETAMINOPHEN 325 MG PO TABS: 650.0000 mg | ORAL_TABLET | ORAL | Status: DC | PRN

## 2018-10-25 MED ORDER — FUROSEMIDE 10 MG/ML IJ SOLN
20.0000 mg | Freq: Once | INTRAMUSCULAR | Status: AC
  Administered 2018-10-26: 20 mg via INTRAVENOUS
  Filled 2018-10-25: qty 2

## 2018-10-25 MED ORDER — GUAIFENESIN ER 600 MG PO TB12
1200.0000 mg | ORAL_TABLET | Freq: Two times a day (BID) | ORAL | Status: DC
  Administered 2018-10-25 – 2018-11-02 (×17): 1200 mg via ORAL
  Filled 2018-10-25 (×17): qty 2

## 2018-10-25 MED ORDER — LORATADINE 10 MG PO TABS
10.0000 mg | ORAL_TABLET | Freq: Every day | ORAL | Status: DC
  Administered 2018-10-25 – 2018-11-02 (×9): 10 mg via ORAL
  Filled 2018-10-25 (×9): qty 1

## 2018-10-25 MED ORDER — MORPHINE SULFATE (PF) 2 MG/ML IV SOLN
1.0000 mg | Freq: Once | INTRAVENOUS | Status: AC
  Administered 2018-10-26: 1 mg via INTRAVENOUS
  Filled 2018-10-25: qty 1

## 2018-10-25 MED ORDER — ACETAMINOPHEN 325 MG PO TABS
650.0000 mg | ORAL_TABLET | ORAL | Status: DC | PRN
  Administered 2018-10-25 – 2018-10-28 (×4): 650 mg via ORAL
  Filled 2018-10-25 (×5): qty 2

## 2018-10-25 MED ORDER — FLUTICASONE PROPIONATE 50 MCG/ACT NA SUSP
2.0000 | Freq: Every day | NASAL | Status: DC
  Administered 2018-10-25 – 2018-11-02 (×9): 2 via NASAL
  Filled 2018-10-25: qty 16

## 2018-10-25 NOTE — Progress Notes (Signed)
Called [employer] to order cooling blanket [phone]

## 2018-10-25 NOTE — Progress Notes (Signed)
PROGRESS NOTE    [name]  WUJ:[id number] DOB: June 23, 1951 DOA: 10/24/2018 PCP: [name], MD   Brief Narrative:  HPI per Dr. [name] is a 68 y.o. female with medical history significant of Chronic kidney disease stage III, hypertension, hyperlipidemia, morbid obesity, significant back pain with chronic pain who presented to the ER with generalized weakness some cough or shortness of breath.  Symptoms have been going on for 3 days.  Denied any sick contacts.  Denied any nausea with vomiting but has myalgias.  She has fever also and chills.Temperature as high as 101.  She took her medications at home but continues to feel bad.  Patient came in to the ER and was found to have possible pneumonia on x-ray.  Suspected influenza also.  She is being admitted for treatment..  ED Course: Temperature 98.6, blood pressure 82/60 pulse 58 respiratory rate of 20 oxygen sats 93% on room air.  ABG showed a pH of 7.375 PCO2 46 PO2 62.  Chemistry appears to be within normal except creatinine 1.12.  Lactic acid 1.1.  Lungs white count 3.7.  Chest x-ray showed peribronchial thickening compatible with bronchitic changes.  There was mild infrahilar airspace opacity possible pneumonia.  Patient is being admitted for treatment.   Assessment & Plan:   Principal Problem:   Pneumonia Active Problems:   Morbid obesity (HCC)   GERD (gastroesophageal reflux disease)   Benign essential HTN   CKD (chronic kidney disease), stage II  1 community-acquired pneumonia Patient presented with cough, shortness of breath, generalized weakness x3 days with no sick contacts also noted to have a fever with a temperature as high as 101.  Chest x-ray done on admission with peribronchial thickening compatible with bronchitic changes and mild infrahilar airspace opacity possibly pneumonia.  Patient currently afebrile.  WBC at 3.9.  Influenza PCR negative.  Urine strep pneumococcus antigen pending.  HIV  pending.  Add Mucinex.  Continue empiric IV Rocephin and azithromycin.  Saline lock IV fluids.  Place on scheduled nebulizers.  Placed on Claritin, PPI.  2.  Gastroesophageal reflux disease PPI.  3.  Hypertension Stable.  Continue Lopressor.  4.  Morbid obesity  5.  Chronic kidney disease stage II Baseline.  Follow.  6.  Diastolic dysfunction with CHF Stable.  Patient currently euvolemic.  Follow.   DVT prophylaxis: Heparin Code Status: Full Family Communication: Updated patient.  No family at bedside. Disposition Plan: Likely home with home health therapies when clinically improved.   Consultants:   None  Procedures:  Chest x-ray 10/24/2018    Antimicrobials:   IV Rocephin 10/24/2018  IV azithromycin 10/24/2018   Subjective: Patient laying in bed eating lunch.  Asking whether she can go home tomorrow.  States shortness of breath is improving since admission.  Denies any chest pain.  Objective: Vitals:   10/24/18 2236 10/25/18 0421 10/25/18 0750 10/25/18 0928  BP: 105/79 127/72 (!) 111/57   Pulse: 76 86 89 91  Resp: 18 18 18  (!) 22  Temp: 98.4 F (36.9 C) 100 F (37.8 C) 98.8 F (37.1 C)   TempSrc: Oral Oral Oral   SpO2: 99% 100% 99% 94%  Weight: 119.2 kg 119.9 kg    Height: 5\' 3"  (1.6 m)       Intake/Output Summary (Last 24 hours) at 10/25/2018 1256 Last data filed at 10/25/2018 0319 Gross per 24 hour  Intake 1716.98 ml  Output 0 ml  Net 1716.98 ml   [employer]  10/24/18 2236 10/25/18 0421  Weight: 119.2 kg 119.9 kg    Examination:  General exam: Appears calm and comfortable  Respiratory system: Some bibasilar coarse breath sounds, no wheezing.  Respiratory effort normal. Cardiovascular system: S1 & S2 heard, RRR. No JVD, murmurs, rubs, gallops or clicks. No pedal edema. Gastrointestinal system: Abdomen is obese, nondistended, soft and nontender. No organomegaly or masses felt. Normal bowel sounds heard. Central nervous system: Alert and  oriented. No focal neurological deficits. Extremities: Symmetric 5 x 5 power. Skin: No rashes, lesions or ulcers Psychiatry: Judgement and insight appear normal. Mood & affect appropriate.     Data Reviewed: I have personally reviewed following labs and imaging studies  CBC: Recent Labs  Lab 10/24/18 1951 10/24/18 2017 10/25/18 0337  WBC 3.7*  --  3.9*  NEUTROABS 1.0*  --   --   HGB 12.7 13.3 11.5*  HCT 43.2 39.0 37.6  MCV 86.6  --  85.6  PLT PLATELET CLUMPS NOTED ON SMEAR, COUNT APPEARS DECREASED  --  PLATELET CLUMPS NOTED ON SMEAR, COUNT APPEARS DECREASED   Basic Metabolic Panel: Recent Labs  Lab 10/24/18 1951 10/24/18 2017 10/25/18 0337  NA 136 141 141  K 3.6 4.4 3.6  CL 108  --  113*  CO2 19*  --  23  GLUCOSE 107*  --  100*  BUN 6*  --  5*  CREATININE 1.12* 1.20* 1.13*  CALCIUM 8.3*  --  7.8*   GFR: Estimated Creatinine Clearance: 58.9 mL/min (A) (by C-G formula based on SCr of 1.13 mg/dL (H)). Liver Function Tests: Recent Labs  Lab 10/24/18 1951 10/25/18 0337  AST 18 16  ALT 12 11  ALKPHOS 63 57  BILITOT 0.3 0.5  PROT 6.7 6.0*  ALBUMIN 3.5 3.0*   No results for input(s): LIPASE, AMYLASE in the last 168 hours. No results for input(s): AMMONIA in the last 168 hours. Coagulation Profile: Recent Labs  Lab 10/24/18 1951  INR 1.0   Cardiac Enzymes: No results for input(s): CKTOTAL, CKMB, CKMBINDEX, TROPONINI in the last 168 hours. BNP (last 3 results) No results for input(s): PROBNP in the last 8760 hours. HbA1C: No results for input(s): HGBA1C in the last 72 hours. CBG: No results for input(s): GLUCAP in the last 168 hours. Lipid Profile: No results for input(s): CHOL, HDL, LDLCALC, TRIG, CHOLHDL, LDLDIRECT in the last 72 hours. Thyroid Function Tests: No results for input(s): TSH, T4TOTAL, FREET4, T3FREE, THYROIDAB in the last 72 hours. Anemia Panel: No results for input(s): VITAMINB12, FOLATE, FERRITIN, TIBC, IRON, RETICCTPCT in the last 72  hours. Sepsis Labs: Recent Labs  Lab 10/24/18 1951  LATICACIDVEN 1.2  1.1    Recent Results (from the past 240 hour(s))  Blood Culture (routine x 2)     Status: None (Preliminary result)   Collection Time: 10/24/18  9:30 PM  Result Value Ref Range Status   Specimen Description BLOOD RIGHT HAND  Final   Special Requests   Final    BOTTLES DRAWN AEROBIC AND ANAEROBIC Blood Culture results may not be optimal due to an inadequate volume of blood received in culture bottles   Culture   Final    NO GROWTH < 12 HOURS Performed at [facility] [address] [place], [place] [address]    Report Status PENDING  Incomplete  Blood Culture (routine x 2)     Status: None (Preliminary result)   Collection Time: 10/24/18 10:00 PM  Result Value Ref Range Status   Specimen  Description BLOOD LEFT ARM  Final   Special Requests   Final    BOTTLES DRAWN AEROBIC ONLY Blood Culture results may not be optimal due to an inadequate volume of blood received in culture bottles   Culture   Final    NO GROWTH < 12 HOURS Performed at [facility] [address], [place], [place] [address]    Report Status PENDING  Incomplete         Radiology Studies: Dg Chest Port 1 View  Result Date: 10/24/2018 CLINICAL DATA:  Fever, cough EXAM: PORTABLE CHEST 1 VIEW COMPARISON:  07/28/2018 FINDINGS: Peribronchial thickening. Heart is borderline in size. Bilateral infrahilar airspace opacities. No effusions or acute bony abnormality. IMPRESSION: Peribronchial thickening compatible with bronchitic changes. Mild infrahilar airspace opacities could reflect atelectasis or infiltrate/pneumonia. Electronically Signed   By: Rolm Baptise M.D.   On: 10/24/2018 20:06        Scheduled Meds: . albuterol  2.5 mg Nebulization TID  . citalopram  20 mg Oral Daily  . fluticasone  2 spray Each Nare Daily  . gabapentin  600 mg Oral TID  . guaiFENesin  1,200 mg Oral BID  . heparin  5,000 Units Subcutaneous Q8H  .  loratadine  10 mg Oral Daily  . methocarbamol  750 mg Oral TID  . metoprolol tartrate  25 mg Oral Daily  . pantoprazole  40 mg Oral Daily  . pneumococcal 23 valent vaccine  0.5 mL Intramuscular Tomorrow-1000   Continuous Infusions: . sodium chloride 75 mL/hr at 10/24/18 2337  . azithromycin 500 mg (10/24/18 2343)  . cefTRIAXone (ROCEPHIN)  IV    . cefTRIAXone (ROCEPHIN)  IV Stopped (10/24/18 2110)     LOS: 1 day    Time spent: 35 minutes    Irine Seal, MD [facility]  If 7PM-7AM, please contact night-coverage [email or web address] 10/25/2018, 12:56 PM

## 2018-10-25 NOTE — Progress Notes (Addendum)
Penni Homans RRT, Messaged [facility] : 3E 20 x PNA temp oral increased 99.1 to 103.1 F   Having chills and increased effort of breathing, increased congestion and gurgling in lungs, RR increased to 24, on 2L o2 [place] sats 95%, on IV ABT, flu panel neg, pls advise.  PMH HTN, CKD.  Asking MD for cooling blanket to be ordered as pt refused ice packs to be applied to body.  Insists on having several blankets layered over body.   Dr. Silas [place] will order cooling blanket, order Morphine, Tylenol and Lasix per [facility].  Home med Lasix 20mg  PO was not ordered on MAR since admission.  Post Lasix, Morphine and Tylenol administration: Oral Temp at 04:26 99.1 F, pt observed and states to have improved breathing rate, effort.  Pt chills ended, sheets saturated and changed.

## 2018-10-26 ENCOUNTER — Inpatient Hospital Stay (HOSPITAL_COMMUNITY): Payer: Medicare Other

## 2018-10-26 DIAGNOSIS — I5033 Acute on chronic diastolic (congestive) heart failure: Secondary | ICD-10-CM

## 2018-10-26 LAB — CBC
HCT: 39.9 % (ref 36.0–46.0)
Hemoglobin: 12.4 g/dL (ref 12.0–15.0)
MCH: 26.3 pg (ref 26.0–34.0)
MCHC: 31.1 g/dL (ref 30.0–36.0)
MCV: 84.5 fL (ref 80.0–100.0)
Platelets: 125 K/uL — ABNORMAL LOW (ref 150–400)
RBC: 4.72 MIL/uL (ref 3.87–5.11)
RDW: 15.2 % (ref 11.5–15.5)
WBC: 4.5 K/uL (ref 4.0–10.5)
nRBC: 0 % (ref 0.0–0.2)

## 2018-10-26 LAB — BASIC METABOLIC PANEL
Anion gap: 9 (ref 5–15)
BUN: <5 mg/dL — ABNORMAL LOW (ref 8–23)
CO2: 24 mmol/L (ref 22–32)
Calcium: 7.8 mg/dL — ABNORMAL LOW (ref 8.9–10.3)
Chloride: 106 mmol/L (ref 98–111)
Creatinine, Ser: 1.08 mg/dL — ABNORMAL HIGH (ref 0.44–1.00)
GFR calc Af Amer: >60 mL/min (ref >60)
GFR calc non Af Amer: 52 mL/min — ABNORMAL LOW (ref >60)
Glucose, Bld: 108 mg/dL — ABNORMAL HIGH (ref 70–99)
Potassium: 3.3 mmol/L — ABNORMAL LOW (ref 3.5–5.1)
Sodium: 139 mmol/L (ref 135–145)

## 2018-10-26 LAB — D-DIMER, QUANTITATIVE: D-Dimer, Quant: 3.06 ug{FEU}/mL — ABNORMAL HIGH (ref 0.00–0.50)

## 2018-10-26 MED ORDER — IPRATROPIUM-ALBUTEROL 0.5-2.5 (3) MG/3ML IN SOLN
3.0000 mL | Freq: Two times a day (BID) | RESPIRATORY_TRACT | Status: DC
  Administered 2018-10-26 – 2018-10-28 (×4): 3 mL via RESPIRATORY_TRACT
  Filled 2018-10-26 (×4): qty 3

## 2018-10-26 MED ORDER — POTASSIUM CHLORIDE CRYS ER 20 MEQ PO TBCR
40.0000 meq | EXTENDED_RELEASE_TABLET | Freq: Once | ORAL | Status: AC
  Administered 2018-10-26: 40 meq via ORAL
  Filled 2018-10-26: qty 2

## 2018-10-26 MED ORDER — FUROSEMIDE 10 MG/ML IJ SOLN
20.0000 mg | Freq: Two times a day (BID) | INTRAMUSCULAR | Status: DC
  Administered 2018-10-26: 20 mg via INTRAVENOUS
  Filled 2018-10-26: qty 2

## 2018-10-26 MED ORDER — FUROSEMIDE 20 MG PO TABS: 20.0000 mg | ORAL_TABLET | Freq: Every day | ORAL | Status: DC

## 2018-10-26 MED ORDER — ALBUTEROL SULFATE (2.5 MG/3ML) 0.083% IN NEBU
2.5000 mg | INHALATION_SOLUTION | RESPIRATORY_TRACT | Status: DC | PRN
  Administered 2018-10-27: 2.5 mg via RESPIRATORY_TRACT
  Filled 2018-10-26: qty 3

## 2018-10-26 MED ORDER — SODIUM CHLORIDE 0.9 % IV SOLN
INTRAVENOUS | Status: DC | PRN
  Administered 2018-10-26 – 2018-10-27 (×2): 1000 mL via INTRAVENOUS

## 2018-10-26 MED ORDER — FUROSEMIDE 10 MG/ML IJ SOLN
40.0000 mg | Freq: Two times a day (BID) | INTRAMUSCULAR | Status: DC
  Administered 2018-10-26 – 2018-10-27 (×3): 40 mg via INTRAVENOUS
  Filled 2018-10-26 (×4): qty 4

## 2018-10-26 NOTE — Progress Notes (Signed)
PROGRESS NOTE    [name]  ZJQ:[id number] DOB: Dec 19, 1950 DOA: 10/24/2018 PCP: [name], MD   Brief Narrative:  HPI per Dr. [name] is a 68 y.o. female with medical history significant of Chronic kidney disease stage III, hypertension, hyperlipidemia, morbid obesity, significant back pain with chronic pain who presented to the ER with generalized weakness some cough or shortness of breath.  Symptoms have been going on for 3 days.  Denied any sick contacts.  Denied any nausea with vomiting but has myalgias.  She has fever also and chills.Temperature as high as 101.  She took her medications at home but continues to feel bad.  Patient came in to the ER and was found to have possible pneumonia on x-ray.  Suspected influenza also.  She is being admitted for treatment..  ED Course: Temperature 98.6, blood pressure 82/60 pulse 58 respiratory rate of 20 oxygen sats 93% on room air.  ABG showed a pH of 7.375 PCO2 46 PO2 62.  Chemistry appears to be within normal except creatinine 1.12.  Lactic acid 1.1.  Lungs white count 3.7.  Chest x-ray showed peribronchial thickening compatible with bronchitic changes.  There was mild infrahilar airspace opacity possible pneumonia.  Patient is being admitted for treatment.   Assessment & Plan:   Principal Problem:   Pneumonia Active Problems:   Morbid obesity (HCC)   GERD (gastroesophageal reflux disease)   Benign essential HTN   CKD (chronic kidney disease), stage II   Community acquired pneumonia   Hypotensive episode  1 community-acquired pneumonia Patient presented with cough, shortness of breath, generalized weakness x3 days with no sick contacts also noted to have a fever with a temperature as high as 101.  Patient with fevers as high as 103 during his hospitalization.  Chest x-ray done on admission with peribronchial thickening compatible with bronchitic changes and mild infrahilar airspace opacity possibly pneumonia.   Patient currently afebrile.  WBC at 3.9.  Influenza PCR negative.  Urine strep pneumococcus antigen negative.  HIV nonreactive.  Continue Mucinex, IV Rocephin and azithromycin.  IV fluids have been saline locked.  Continue scheduled nebs, Claritin, PPI.   2.  Gastroesophageal reflux disease Continue PPI.  3.  Hypertension Stable.  Continue Lopressor.  4.  Morbid obesity  5.  Chronic kidney disease stage II Baseline.  Follow.  6.  Acute on chronic diastolic dysfunction with CHF Events overnight noted as patient noted to be gurgling with shortness of breath and worsening respiratory status.  Patient's diuretics were held on admission secondary to borderline blood pressure.  Patient given fluid resuscitation.  IV fluids discontinued.  Repeat chest x-ray this morning.  Patient states he used to be on Lasix 80 mg daily at home however Lasix was decreased to 20 mg daily on discharge from last hospitalization.  Patient given Lasix 20 mg IV overnight with clinical improvement.  Will place on Lasix 40 mg IV every 12 hours.  Strict I's and O's.  Daily weights.  Monitor blood pressure.  Follow.  7.  Hypokalemia Secondary to diuresis.  Replete.  8.  Fever Questionable etiology.  Felt secondary to pneumonia.  Patient has been pancultured results pending.  Patient noted on admission presented with generalized weakness, cough, shortness of breath, some myalgias and a temperature of 101.  Patient on admission noted to be hypotensive with blood pressure of 82/60.  ABG with a pH of 7.37 PCO2 46 PO2 of 62.  Patient morbidly obese and likely decreased mobility.  Patient on empiric IV Rocephin and azithromycin which we will continue for now.  Check a d-dimer.  Follow.   DVT prophylaxis: Heparin Code Status: Full Family Communication: Updated patient.  No family at bedside. Disposition Plan: Likely home with home health therapies when clinically improved, afebrile for 24 to 48 hours with improvement in  respiratory status..   Consultants:   None  Procedures:  Chest x-ray 10/24/2018    Antimicrobials:   IV Rocephin 10/24/2018  IV azithromycin 10/24/2018   Subjective: Patient laying in bed.  Stated had some significant shortness of breath overnight however has improved since then.  Noted to have a temperature of 103 overnight.  States improvement with shortness of breath.  Denies any chest pain.  Objective: Vitals:   10/26/18 0128 10/26/18 0426 10/26/18 0857 10/26/18 0900  BP:  131/73 (!) 167/94   Pulse:  73 92   Resp:  18    Temp:  99.1 F (37.3 C)    TempSrc:  Oral    SpO2: 98% 95%  93%  Weight:  119.7 kg    Height:        Intake/Output Summary (Last 24 hours) at 10/26/2018 1253 Last data filed at 10/26/2018 1249 Gross per 24 hour  Intake 811.8 ml  Output 2750 ml  Net -1938.2 ml   [employer]   10/24/18 2236 10/25/18 0421 10/26/18 0426  Weight: 119.2 kg 119.9 kg 119.7 kg    Examination:  General exam: NAD Respiratory system: Diffuse crackles noted.  No wheezing.  Speaking in full sentences.  No use of accessory muscles of respiration. Respiratory effort normal. Cardiovascular system: Regular rate and rhythm no murmurs rubs or gallops.  No JVD.  No lower extremity edema. Gastrointestinal system: Abdomen is obese, nondistended, soft, positive bowel sounds.  No rebound.  No guarding.  Nontender. Central nervous system: Alert and oriented. No focal neurological deficits. Extremities: Symmetric 5 x 5 power. Skin: No rashes, lesions or ulcers Psychiatry: Judgement and insight appear normal. Mood & affect appropriate.     Data Reviewed: I have personally reviewed following labs and imaging studies  CBC: Recent Labs  Lab 10/24/18 1951 10/24/18 2017 10/25/18 0337 10/26/18 0544  WBC 3.7*  --  3.9* 4.5  NEUTROABS 1.0*  --   --   --   HGB 12.7 13.3 11.5* 12.4  HCT 43.2 39.0 37.6 39.9  MCV 86.6  --  85.6 84.5  PLT PLATELET CLUMPS NOTED ON SMEAR, COUNT APPEARS  DECREASED  --  PLATELET CLUMPS NOTED ON SMEAR, COUNT APPEARS DECREASED 093*   Basic Metabolic Panel: Recent Labs  Lab 10/24/18 1951 10/24/18 2017 10/25/18 0337 10/26/18 0544  NA 136 141 141 139  K 3.6 4.4 3.6 3.3*  CL 108  --  113* 106  CO2 19*  --  23 24  GLUCOSE 107*  --  100* 108*  BUN 6*  --  5* <5*  CREATININE 1.12* 1.20* 1.13* 1.08*  CALCIUM 8.3*  --  7.8* 7.8*   GFR: Estimated Creatinine Clearance: 61.5 mL/min (A) (by C-G formula based on SCr of 1.08 mg/dL (H)). Liver Function Tests: Recent Labs  Lab 10/24/18 1951 10/25/18 0337  AST 18 16  ALT 12 11  ALKPHOS 63 57  BILITOT 0.3 0.5  PROT 6.7 6.0*  ALBUMIN 3.5 3.0*   No results for input(s): LIPASE, AMYLASE in the last 168 hours. No results for input(s): AMMONIA in the last 168 hours. Coagulation Profile: Recent Labs  Lab 10/24/18 1951  INR  1.0   Cardiac Enzymes: No results for input(s): CKTOTAL, CKMB, CKMBINDEX, TROPONINI in the last 168 hours. BNP (last 3 results) No results for input(s): PROBNP in the last 8760 hours. HbA1C: No results for input(s): HGBA1C in the last 72 hours. CBG: No results for input(s): GLUCAP in the last 168 hours. Lipid Profile: No results for input(s): CHOL, HDL, LDLCALC, TRIG, CHOLHDL, LDLDIRECT in the last 72 hours. Thyroid Function Tests: No results for input(s): TSH, T4TOTAL, FREET4, T3FREE, THYROIDAB in the last 72 hours. Anemia Panel: No results for input(s): VITAMINB12, FOLATE, FERRITIN, TIBC, IRON, RETICCTPCT in the last 72 hours. Sepsis Labs: Recent Labs  Lab 10/24/18 1951  LATICACIDVEN 1.2  1.1    Recent Results (from the past 240 hour(s))  Blood Culture (routine x 2)     Status: None (Preliminary result)   Collection Time: 10/24/18  9:30 PM  Result Value Ref Range Status   Specimen Description BLOOD RIGHT HAND  Final   Special Requests   Final    BOTTLES DRAWN AEROBIC AND ANAEROBIC Blood Culture results may not be optimal due to an inadequate volume of  blood received in culture bottles   Culture   Final    NO GROWTH 2 DAYS Performed at [facility], [place] [address], [place], [place] [address]    Report Status PENDING  Incomplete  Blood Culture (routine x 2)     Status: None (Preliminary result)   Collection Time: 10/24/18 10:00 PM  Result Value Ref Range Status   Specimen Description BLOOD LEFT ARM  Final   Special Requests   Final    BOTTLES DRAWN AEROBIC ONLY Blood Culture results may not be optimal due to an inadequate volume of blood received in culture bottles   Culture   Final    NO GROWTH 2 DAYS Performed at [facility], [place] [address], [place], [place] [address]    Report Status PENDING  Incomplete         Radiology Studies: Dg Chest 2 View  Result Date: 10/26/2018 CLINICAL DATA:  Cough and shortness of breath for 3 days. EXAM: CHEST - 2 VIEW COMPARISON:  10/24/2018 FINDINGS: Mildly enlarged cardiac silhouette. Mediastinal contours appear intact. Increased interstitial markings with lower lobe predominance. Osseous structures are without acute abnormality. Soft tissues are grossly normal. IMPRESSION: 1. Mildly enlarged cardiac silhouette. 2. Increased interstitial markings with lower lobe predominance may represent asymmetric pulmonary edema or atypical pneumonia. Electronically Signed   By: Fidela Salisbury M.D.   On: 10/26/2018 11:17   Dg Chest Port 1 View  Result Date: 10/24/2018 CLINICAL DATA:  Fever, cough EXAM: PORTABLE CHEST 1 VIEW COMPARISON:  07/28/2018 FINDINGS: Peribronchial thickening. Heart is borderline in size. Bilateral infrahilar airspace opacities. No effusions or acute bony abnormality. IMPRESSION: Peribronchial thickening compatible with bronchitic changes. Mild infrahilar airspace opacities could reflect atelectasis or infiltrate/pneumonia. Electronically Signed   By: Rolm Baptise M.D.   On: 10/24/2018 20:06        Scheduled Meds: . citalopram  20 mg Oral Daily  . fluticasone  2 spray  Each Nare Daily  . furosemide  20 mg Intravenous Q12H  . [START ON 10/27/2018] furosemide  20 mg Oral Daily  . gabapentin  600 mg Oral TID  . guaiFENesin  1,200 mg Oral BID  . heparin  5,000 Units Subcutaneous Q8H  . ipratropium-albuterol  3 mL Nebulization BID  . loratadine  10 mg Oral Daily  . methocarbamol  750 mg Oral TID  .  metoprolol tartrate  25 mg Oral Daily  . pantoprazole  40 mg Oral Daily  . pneumococcal 23 valent vaccine  0.5 mL Intramuscular Tomorrow-1000   Continuous Infusions: . azithromycin 500 mg (10/25/18 2337)  . cefTRIAXone (ROCEPHIN)  IV 2 g (10/25/18 2011)     LOS: 2 days    Time spent: 35 minutes    Irine Seal, MD [facility]  If 7PM-7AM, please contact night-coverage [email or web address] 10/26/2018, 12:53 PM

## 2018-10-26 NOTE — Evaluation (Signed)
Physical Therapy Evaluation Patient Details Name: [name] MRN: [id number] DOB: 1950/10/11 Today's Date: 10/26/2018   History of Present Illness  68 y.o. female with medical history significant of Chronic kidney disease stage III, hypertension, hyperlipidemia, morbid obesity, and chronic back pain.  She sustained L humeral and L5 spinal fx when she fell in Oct. 2019. She presented to the ER with generalized weakness some cough and shortness of breath.  She was admitted with dx of PNA.     Clinical Impression  Pt admitted with above diagnosis. Pt currently with functional limitations due to the deficits listed below (see PT Problem List). PTA pt lived at home alone. She has an aide 2 hrs/day. She ambulates with RW. On eval, she required min assist bed mobility, min guard assist sit to stand, and min guard assist ambulation 5 feet with RW. Mobility limited by fatigue and cardiopulmonary status. Pt will benefit from skilled PT to increase their independence and safety with mobility to allow discharge to the venue listed below.       Follow Up Recommendations Home health PT    Equipment Recommendations  None recommended by PT    Recommendations for Other Services       Precautions / Restrictions Precautions Precautions: Fall      Mobility  Bed Mobility Overal bed mobility: Needs Assistance Bed Mobility: Supine to Sit     Supine to sit: HOB elevated;Min assist     General bed mobility comments: assist to elevate trunk, cues for sequencing, increased time and effort  Transfers Overall transfer level: Needs assistance Equipment used: Rolling walker (2 wheeled) Transfers: Sit to/from Stand Sit to Stand: Min guard         General transfer comment: Pt used momentum to come to stand from EOB. Increased time to stabilize initial standing balance.  Ambulation/Gait Ambulation/Gait assistance: Min guard Gait Distance (Feet): 5 Feet Assistive device: Rolling walker (2  wheeled) Gait Pattern/deviations: Wide base of support;Step-through pattern;Decreased stride length Gait velocity: decreased   General Gait Details: Pt on RA with SpO2 >95%. 2/4 DOE   Financial trader Rankin (Stroke Patients Only)       Balance Overall balance assessment: Needs assistance Sitting-balance support: No upper extremity supported;Feet supported Sitting balance-Leahy Scale: Fair     Standing balance support: Bilateral upper extremity supported;During functional activity Standing balance-Leahy Scale: Poor Standing balance comment: reliant on BUE support                             Pertinent Vitals/Pain Pain Assessment: Faces Faces Pain Scale: Hurts a little bit Pain Location: low back Pain Descriptors / Indicators: Discomfort Pain Intervention(s): Monitored during session;Repositioned    Home Living Family/patient expects to be discharged to:: Private residence Living Arrangements: Alone Available Help at Discharge: Personal care attendant;Family;Available PRN/intermittently Type of Home: Apartment Home Access: Level entry;Elevator     Home Layout: One level Home Equipment: Walker - 2 wheels;Bedside commode;Wheelchair - manual;Cane - single point Additional Comments: daughter recently moved out    Prior Function Level of Independence: Needs assistance   Gait / Transfers Assistance Needed: RW for mobility. Pt reports 2 recent falls requiring EMS to assist her off the floor.   ADL's / Homemaking Assistance Needed: assist for ADLs. Pt has PCA 2 hours/day 7 days/week.        Hand Dominance  Dominant Hand: Right    Extremity/Trunk Assessment   Upper Extremity Assessment Upper Extremity Assessment: LUE deficits/detail LUE Deficits / Details: limited shoulder ROM due to previous fx/injury    Lower Extremity Assessment Lower Extremity Assessment: Generalized weakness       Communication    Communication: No difficulties  Cognition Arousal/Alertness: Awake/alert Behavior During Therapy: WFL for tasks assessed/performed Overall Cognitive Status: Within Functional Limits for tasks assessed                                        General Comments      Exercises     Assessment/Plan    PT Assessment Patient needs continued PT services  PT Problem List Decreased strength;Decreased balance;Pain;Decreased mobility;Cardiopulmonary status limiting activity;Obesity;Decreased activity tolerance       PT Treatment Interventions Functional mobility training;Balance training;Patient/family education;Gait training;Therapeutic activities;Therapeutic exercise    PT Goals (Current goals can be found in the Care Plan section)  Acute Rehab PT Goals Patient Stated Goal: home PT Goal Formulation: With patient Time For Goal Achievement: 11/09/18 Potential to Achieve Goals: Good    Frequency Min 3X/week   Barriers to discharge        Co-evaluation               AM-PAC PT "6 Clicks" Mobility  Outcome Measure Help needed turning from your back to your side while in a flat bed without using bedrails?: A Little Help needed moving from lying on your back to sitting on the side of a flat bed without using bedrails?: A Lot Help needed moving to and from a bed to a chair (including a wheelchair)?: A Little Help needed standing up from a chair using your arms (e.g., wheelchair or bedside chair)?: A Little Help needed to walk in hospital room?: A Little Help needed climbing 3-5 steps with a railing? : A Lot 6 Click Score: 16    End of Session Equipment Utilized During Treatment: Gait belt Activity Tolerance: Patient tolerated treatment well Patient left: in chair;with call bell/phone within reach Nurse Communication: Mobility status PT Visit Diagnosis: History of falling (Z91.81);Muscle weakness (generalized) (M62.81);Difficulty in walking, not elsewhere classified  (R26.2);Other abnormalities of gait and mobility (R26.89)    Time: 1253-1315 PT Time Calculation (min) (ACUTE ONLY): 22 min   Charges:   PT Evaluation $PT Eval Moderate Complexity: 1 Mod          Lorrin Goodell, PT  Office # [phone] Pager [phone]   [name] 10/26/2018, 1:43 PM

## 2018-10-27 LAB — RESPIRATORY PANEL BY PCR
ADENOVIRUS-RVPPCR: NOT DETECTED
Bordetella pertussis: NOT DETECTED
CORONAVIRUS NL63-RVPPCR: NOT DETECTED
Chlamydophila pneumoniae: NOT DETECTED
Coronavirus 229E: NOT DETECTED
Coronavirus HKU1: NOT DETECTED
Coronavirus OC43: NOT DETECTED
Influenza A: NOT DETECTED
Influenza B: NOT DETECTED
Metapneumovirus: DETECTED — AB
Mycoplasma pneumoniae: NOT DETECTED
Parainfluenza Virus 1: NOT DETECTED
Parainfluenza Virus 2: NOT DETECTED
Parainfluenza Virus 3: NOT DETECTED
Parainfluenza Virus 4: NOT DETECTED
Respiratory Syncytial Virus: NOT DETECTED
Rhinovirus / Enterovirus: NOT DETECTED

## 2018-10-27 LAB — CBC
HCT: 42.5 % (ref 36.0–46.0)
Hemoglobin: 13.3 g/dL (ref 12.0–15.0)
MCH: 26.2 pg (ref 26.0–34.0)
MCHC: 31.3 g/dL (ref 30.0–36.0)
MCV: 83.8 fL (ref 80.0–100.0)
PLATELETS: 107 10*3/uL — AB (ref 150–400)
RBC: 5.07 MIL/uL (ref 3.87–5.11)
RDW: 15.3 % (ref 11.5–15.5)
WBC: 3.3 10*3/uL — ABNORMAL LOW (ref 4.0–10.5)
nRBC: 0 % (ref 0.0–0.2)

## 2018-10-27 LAB — BASIC METABOLIC PANEL
Anion gap: 13 (ref 5–15)
BUN: 6 mg/dL — ABNORMAL LOW (ref 8–23)
CO2: 23 mmol/L (ref 22–32)
Calcium: 8.7 mg/dL — ABNORMAL LOW (ref 8.9–10.3)
Chloride: 103 mmol/L (ref 98–111)
Creatinine, Ser: 1.06 mg/dL — ABNORMAL HIGH (ref 0.44–1.00)
GFR, EST NON AFRICAN AMERICAN: 54 mL/min — AB (ref >60)
Glucose, Bld: 124 mg/dL — ABNORMAL HIGH (ref 70–99)
Potassium: 3.4 mmol/L — ABNORMAL LOW (ref 3.5–5.1)
SODIUM: 139 mmol/L (ref 135–145)

## 2018-10-27 MED ORDER — POTASSIUM CHLORIDE CRYS ER 20 MEQ PO TBCR
40.0000 meq | EXTENDED_RELEASE_TABLET | Freq: Every day | ORAL | Status: DC
  Administered 2018-10-27 – 2018-10-28 (×2): 40 meq via ORAL
  Filled 2018-10-27 (×2): qty 2

## 2018-10-27 MED ORDER — SORBITOL 70 % SOLN
30.0000 mL | Freq: Once | Status: DC
  Filled 2018-10-27: qty 30

## 2018-10-27 MED ORDER — AZITHROMYCIN 500 MG PO TABS
500.0000 mg | ORAL_TABLET | Freq: Every day | ORAL | Status: DC
  Administered 2018-10-27: 500 mg via ORAL
  Filled 2018-10-27: qty 1

## 2018-10-27 NOTE — Plan of Care (Signed)
  Problem: Education: Goal: Knowledge of General Education information will improve Description Including pain rating scale, medication(s)/side effects and non-pharmacologic comfort measures Outcome: Progressing   Problem: Health Behavior/Discharge Planning: Goal: Ability to manage health-related needs will improve Outcome: Progressing   

## 2018-10-27 NOTE — Progress Notes (Signed)
Called Infection Prevention about patient being on droplet precautions. Spoke with [employer]. RN was told that metapneumovirus does not require droplet precautions.  Precautions will be removed and MD updated.

## 2018-10-27 NOTE — Progress Notes (Signed)
PROGRESS NOTE    [name]  BMW:[id number] DOB: [birth year] DOA: 10/24/2018 PCP: [name], MD   Brief Narrative:  HPI per Dr. [name] is a 68 y.o. female with medical history significant of Chronic kidney disease stage III, hypertension, hyperlipidemia, morbid obesity, significant back pain with chronic pain who presented to the ER with generalized weakness some cough or shortness of breath.  Symptoms have been going on for 3 days.  Denied any sick contacts.  Denied any nausea with vomiting but has myalgias.  She has fever also and chills.Temperature as high as 101.  She took her medications at home but continues to feel bad.  Patient came in to the ER and was found to have possible pneumonia on x-ray.  Suspected influenza also.  She is being admitted for treatment..  ED Course: Temperature 98.6, blood pressure 82/60 pulse 58 respiratory rate of 20 oxygen sats 93% on room air.  ABG showed a pH of 7.375 PCO2 46 PO2 62.  Chemistry appears to be within normal except creatinine 1.12.  Lactic acid 1.1.  Lungs white count 3.7.  Chest x-ray showed peribronchial thickening compatible with bronchitic changes.  There was mild infrahilar airspace opacity possible pneumonia.  Patient is being admitted for treatment.   Assessment & Plan:   Principal Problem:   Pneumonia Active Problems:   Morbid obesity (HCC)   GERD (gastroesophageal reflux disease)   Benign essential HTN   CKD (chronic kidney disease), stage II   Community acquired pneumonia   Hypotensive episode  1 community-acquired pneumonia Patient presented with cough, shortness of breath, generalized weakness x3 days with no sick contacts also noted to have a fever with a temperature as high as 101.  Patient with fevers as high as 103 during his hospitalization.  Fever curve trending down.  Chest x-ray done on admission with peribronchial thickening compatible with bronchitic changes and mild infrahilar airspace  opacity possibly pneumonia.  Patient noted to have a fever 101 overnight.  WBC at 3.3.  Influenza PCR negative.  Urine strep pneumococcus antigen negative.  HIV nonreactive.  Respiratory viral panel positive for metapneumovirus.  Continue Mucinex, IV Rocephin and azithromycin.  IV fluids have been saline locked.  Continue scheduled nebs, Claritin, PPI.  Could likely transition to oral antibiotics tomorrow if patient remains afebrile for the next 24 hours.  Follow.  2.  Gastroesophageal reflux disease Continue PPI.  3.  Hypertension Stable.  Continue Lopressor.  4.  Morbid obesity  5.  Chronic kidney disease stage II Baseline.  Follow.  6.  Acute on chronic diastolic dysfunction with CHF Events the night of 10/25/2018, noted as patient noted to be gurgling with shortness of breath and worsening respiratory status.  Patient's diuretics were held on admission secondary to borderline blood pressure.  Patient given fluid resuscitation.  IV fluids discontinued.  Repeat chest x-ray with mildly enlarged cardiac silhouette, increased interstitial markings with lower lobe predominance may represent asymmetric pulmonary edema or atypical pneumonia. Patient states he used to be on Lasix 80 mg daily at home however Lasix was decreased to 20 mg daily on discharge from last hospitalization.  Patient given Lasix 20 mg IV overnight with clinical improvement.  Patient placed on Lasix 40 mg IV every 12 hours with a urine output of 4.550 L over the past 24 hours.  Continue IV diuretics.  Strict I's and O's.  Daily weights.  Follow.   7.  Hypokalemia Secondary to diuresis.  Replete.  8.  Fever Questionable etiology.  Likely secondary to viral respiratory infection and probable pneumonia.  Patient has been pancultured results pending.  Patient noted on admission presented with generalized weakness, cough, shortness of breath, some myalgias and a temperature of 101.  Patient on admission noted to be hypotensive with  blood pressure of 82/60.  ABG with a pH of 7.37 PCO2 46 PO2 of 62.  Patient morbidly obese and likely decreased mobility. Patient on empiric IV Rocephin and azithromycin which we will continue for now.  D-dimer elevated and likely secondary to acute infection.  Respiratory viral panel positive for metapneumovirus.  No need for CT angios chest or VQ scan.  Follow fever curve.  Continue current treatment regimen.  Follow.    DVT prophylaxis: SCDs Code Status: Full Family Communication: Updated patient.  No family at bedside. Disposition Plan: Likely home with home health therapies when clinically improved, afebrile for 24 to 48 hours with improvement in respiratory status..   Consultants:   None  Procedures:  Chest x-ray 10/24/2018    Antimicrobials:   IV Rocephin 10/24/2018  IV azithromycin 10/24/2018   Subjective: Patient sitting up on bedside commode.  Feels shortness of breath is improving.  Denies any chest pain.  No nausea or emesis.  Patient noted to have a temperature of 101 overnight.  Objective: Vitals:   10/27/18 0614 10/27/18 0753 10/27/18 0926 10/27/18 1207  BP:   (!) 147/85 (!) 146/83  Pulse:   83 77  Resp:    20  Temp:    98.4 F (36.9 C)  TempSrc:    Oral  SpO2:  96% 90% 96%  Weight: 119 kg     Height:        Intake/Output Summary (Last 24 hours) at 10/27/2018 1253 Last data filed at 10/27/2018 1210 Gross per 24 hour  Intake 862.16 ml  Output 3650 ml  Net -2787.84 ml   Filed Weights   10/25/18 0421 10/26/18 0426 10/27/18 0614  Weight: 119.9 kg 119.7 kg 119 kg    Examination:  General exam: NAD Respiratory system: Diffuse crackles.  No wheezing noted.  Some coarse breath sounds.  Speaking in full sentences.  No use of accessory muscles of respiration. Cardiovascular system: RRR no murmurs rubs or gallops.  No JVD.  1+ bilateral lower extremity edema.  Gastrointestinal system: Abdomen is nondistended, soft, positive bowel sounds.  No rebound.  No  guarding.  Central nervous system: Alert and oriented. No focal neurological deficits. Extremities: Symmetric 5 x 5 power. Skin: No rashes, lesions or ulcers Psychiatry: Judgement and insight appear normal. Mood & affect appropriate.     Data Reviewed: I have personally reviewed following labs and imaging studies  CBC: Recent Labs  Lab 10/24/18 1951 10/24/18 2017 10/25/18 0337 10/26/18 0544 10/27/18 0454  WBC 3.7*  --  3.9* 4.5 3.3*  NEUTROABS 1.0*  --   --   --   --   HGB 12.7 13.3 11.5* 12.4 13.3  HCT 43.2 39.0 37.6 39.9 42.5  MCV 86.6  --  85.6 84.5 83.8  PLT PLATELET CLUMPS NOTED ON SMEAR, COUNT APPEARS DECREASED  --  PLATELET CLUMPS NOTED ON SMEAR, COUNT APPEARS DECREASED 125* 332*   Basic Metabolic Panel: Recent Labs  Lab 10/24/18 1951 10/24/18 2017 10/25/18 0337 10/26/18 0544 10/27/18 0454  NA 136 141 141 139 139  K 3.6 4.4 3.6 3.3* 3.4*  CL 108  --  113* 106 103  CO2 19*  --  23 24 23   GLUCOSE  107*  --  100* 108* 124*  BUN 6*  --  5* <5* 6*  CREATININE 1.12* 1.20* 1.13* 1.08* 1.06*  CALCIUM 8.3*  --  7.8* 7.8* 8.7*   GFR: Estimated Creatinine Clearance: 62.5 mL/min (A) (by C-G formula based on SCr of 1.06 mg/dL (H)). Liver Function Tests: Recent Labs  Lab 10/24/18 1951 10/25/18 0337  AST 18 16  ALT 12 11  ALKPHOS 63 57  BILITOT 0.3 0.5  PROT 6.7 6.0*  ALBUMIN 3.5 3.0*   No results for input(s): LIPASE, AMYLASE in the last 168 hours. No results for input(s): AMMONIA in the last 168 hours. Coagulation Profile: Recent Labs  Lab 10/24/18 1951  INR 1.0   Cardiac Enzymes: No results for input(s): CKTOTAL, CKMB, CKMBINDEX, TROPONINI in the last 168 hours. BNP (last 3 results) No results for input(s): PROBNP in the last 8760 hours. HbA1C: No results for input(s): HGBA1C in the last 72 hours. CBG: No results for input(s): GLUCAP in the last 168 hours. Lipid Profile: No results for input(s): CHOL, HDL, LDLCALC, TRIG, CHOLHDL, LDLDIRECT in the  last 72 hours. Thyroid Function Tests: No results for input(s): TSH, T4TOTAL, FREET4, T3FREE, THYROIDAB in the last 72 hours. Anemia Panel: No results for input(s): VITAMINB12, FOLATE, FERRITIN, TIBC, IRON, RETICCTPCT in the last 72 hours. Sepsis Labs: Recent Labs  Lab 10/24/18 1951  LATICACIDVEN 1.2  1.1    Recent Results (from the past 240 hour(s))  Blood Culture (routine x 2)     Status: None (Preliminary result)   Collection Time: 10/24/18  9:30 PM  Result Value Ref Range Status   Specimen Description BLOOD RIGHT HAND  Final   Special Requests   Final    BOTTLES DRAWN AEROBIC AND ANAEROBIC Blood Culture results may not be optimal due to an inadequate volume of blood received in culture bottles   Culture   Final    NO GROWTH 3 DAYS Performed at [facility], [place] [address] [place], [place] [address]    Report Status PENDING  Incomplete  Blood Culture (routine x 2)     Status: None (Preliminary result)   Collection Time: 10/24/18 10:00 PM  Result Value Ref Range Status   Specimen Description BLOOD LEFT ARM  Final   Special Requests   Final    BOTTLES DRAWN AEROBIC ONLY Blood Culture results may not be optimal due to an inadequate volume of blood received in culture bottles   Culture   Final    NO GROWTH 3 DAYS Performed at [facility], [place] [address] [place], [place] [address]    Report Status PENDING  Incomplete         Radiology Studies: Dg Chest 2 View  Result Date: 10/26/2018 CLINICAL DATA:  Cough and shortness of breath for 3 days. EXAM: CHEST - 2 VIEW COMPARISON:  10/24/2018 FINDINGS: Mildly enlarged cardiac silhouette. Mediastinal contours appear intact. Increased interstitial markings with lower lobe predominance. Osseous structures are without acute abnormality. Soft tissues are grossly normal. IMPRESSION: 1. Mildly enlarged cardiac silhouette. 2. Increased interstitial markings with lower lobe predominance may represent asymmetric pulmonary edema  or atypical pneumonia. Electronically Signed   By: Fidela Salisbury M.D.   On: 10/26/2018 11:17        Scheduled Meds: . azithromycin  500 mg Oral QHS  . citalopram  20 mg Oral Daily  . fluticasone  2 spray Each Nare Daily  . furosemide  40 mg Intravenous Q12H  . gabapentin  600 mg Oral TID  . guaiFENesin  1,200 mg Oral BID  . ipratropium-albuterol  3 mL Nebulization BID  . loratadine  10 mg Oral Daily  . methocarbamol  750 mg Oral TID  . metoprolol tartrate  25 mg Oral Daily  . pantoprazole  40 mg Oral Daily  . pneumococcal 23 valent vaccine  0.5 mL Intramuscular Tomorrow-1000  . potassium chloride  40 mEq Oral Daily   Continuous Infusions: . sodium chloride 1,000 mL (10/26/18 2235)  . cefTRIAXone (ROCEPHIN)  IV 2 g (10/26/18 1802)     LOS: 3 days    Time spent: 35 minutes    Irine Seal, MD [facility]  If 7PM-7AM, please contact night-coverage [email or web address] 10/27/2018, 12:53 PM

## 2018-10-27 NOTE — Progress Notes (Signed)
Physical Therapy Treatment Patient Details Name: [name] MRN: [id number] DOB: [birth year] Today's Date: 10/27/2018    History of Present Illness 68 y.o. female with medical history significant of Chronic kidney disease stage III, hypertension, hyperlipidemia, morbid obesity, and chronic back pain.  She sustained L humeral and L5 spinal fx when she fell in Oct. 2019. She presented to the ER with generalized weakness some cough and shortness of breath.  She was admitted with dx of PNA.     PT Comments    Pt received in bed, agreeable to participation in therapy. She reports having a bad night and not sleeping well. She required min assist bed mobility, min guard assist transfers with RW, and min guard assist ambulation 20 feet with RW. Gait distance limited by fatigue. Pt assisted with transfer to [facility] for BM prior to ambulating in room. Pt positioned in recliner with feet elevated at end of session. Pt on RA with SpO2 >90%.    Follow Up Recommendations  Home health PT     Equipment Recommendations  Rolling walker with 5" wheels;3in1 (PT)(wide/bariatric)    Recommendations for Other Services       Precautions / Restrictions Precautions Precautions: Fall    Mobility  Bed Mobility Overal bed mobility: Needs Assistance Bed Mobility: Rolling;Sidelying to Sit Rolling: Modified independent (Device/Increase time) Sidelying to sit: Min assist;HOB elevated       General bed mobility comments: assist to elevate trunk, cues for sequencing, increased time and effort  Transfers Overall transfer level: Needs assistance Equipment used: Rolling walker (2 wheeled) Transfers: Sit to/from [facility] Sit to Stand: Min guard Stand pivot transfers: Min guard       General transfer comment: cues for hand placement, increased time and effort. sit to stand from EOB and BSC. SPT with RW bed to [facility].   Ambulation/Gait Ambulation/Gait assistance: Min guard Gait Distance  (Feet): 20 Feet Assistive device: Rolling walker (2 wheeled) Gait Pattern/deviations: Wide base of support;Step-through pattern;Decreased stride length Gait velocity: decreased   General Gait Details: Pt on RA with SpO2 >95%. 1/4 DOE. Distance limited by fatigue.   Stairs             Wheelchair Mobility    Modified Rankin (Stroke Patients Only)       Balance Overall balance assessment: Needs assistance Sitting-balance support: No upper extremity supported;Feet supported Sitting balance-Leahy Scale: Fair     Standing balance support: Bilateral upper extremity supported;During functional activity Standing balance-Leahy Scale: Poor Standing balance comment: reliant on BUE support                            Cognition Arousal/Alertness: Awake/alert Behavior During Therapy: WFL for tasks assessed/performed Overall Cognitive Status: Within Functional Limits for tasks assessed                                        Exercises      General Comments        Pertinent Vitals/Pain Pain Assessment: No/denies pain    Home Living                      Prior Function            PT Goals (current goals can now be found in the care plan section) Acute Rehab PT Goals Patient Stated Goal:  home PT Goal Formulation: With patient Time For Goal Achievement: 11/09/18 Potential to Achieve Goals: Good Progress towards PT goals: Progressing toward goals    Frequency    Min 3X/week      PT Plan Equipment recommendations need to be updated    Co-evaluation              AM-PAC PT "6 Clicks" Mobility   Outcome Measure  Help needed turning from your back to your side while in a flat bed without using bedrails?: A Little Help needed moving from lying on your back to sitting on the side of a flat bed without using bedrails?: A Little Help needed moving to and from a bed to a chair (including a wheelchair)?: A Little Help needed  standing up from a chair using your arms (e.g., wheelchair or bedside chair)?: A Little Help needed to walk in hospital room?: A Little Help needed climbing 3-5 steps with a railing? : A Lot 6 Click Score: 17    End of Session Equipment Utilized During Treatment: Gait belt Activity Tolerance: Patient tolerated treatment well Patient left: in chair;with call bell/phone within reach Nurse Communication: Mobility status PT Visit Diagnosis: History of falling (Z91.81);Muscle weakness (generalized) (M62.81);Difficulty in walking, not elsewhere classified (R26.2);Other abnormalities of gait and mobility (R26.89)     Time: 1250-1315 PT Time Calculation (min) (ACUTE ONLY): 25 min  Charges:  $Gait Training: 8-22 mins $Therapeutic Activity: 8-22 mins                     Lorrin Goodell, PT  Office # [phone] Pager [phone]    [name] 10/27/2018, 1:42 PM

## 2018-10-27 NOTE — Progress Notes (Signed)
Patient on droplet precautions pending respirator PCR results.

## 2018-10-27 NOTE — Progress Notes (Signed)
Patient platelet resulted as "platelet clumping".  [name] from lab called RN and said that the correct result is "consistent with previous result".

## 2018-10-28 LAB — BASIC METABOLIC PANEL
Anion gap: 10 (ref 5–15)
BUN: 11 mg/dL (ref 8–23)
CO2: 28 mmol/L (ref 22–32)
Calcium: 8.9 mg/dL (ref 8.9–10.3)
Chloride: 102 mmol/L (ref 98–111)
Creatinine, Ser: 1.02 mg/dL — ABNORMAL HIGH (ref 0.44–1.00)
GFR calc Af Amer: >60 mL/min (ref >60)
GFR calc non Af Amer: 56 mL/min — ABNORMAL LOW (ref >60)
Glucose, Bld: 110 mg/dL — ABNORMAL HIGH (ref 70–99)
Potassium: 3.8 mmol/L (ref 3.5–5.1)
Sodium: 140 mmol/L (ref 135–145)

## 2018-10-28 LAB — CBC
HCT: 43.1 % (ref 36.0–46.0)
Hemoglobin: 13.8 g/dL (ref 12.0–15.0)
MCH: 26.7 pg (ref 26.0–34.0)
MCHC: 32 g/dL (ref 30.0–36.0)
MCV: 83.4 fL (ref 80.0–100.0)
Platelets: 120 10*3/uL — ABNORMAL LOW (ref 150–400)
RBC: 5.17 MIL/uL — ABNORMAL HIGH (ref 3.87–5.11)
RDW: 14.9 % (ref 11.5–15.5)
WBC: 3.9 10*3/uL — ABNORMAL LOW (ref 4.0–10.5)
nRBC: 0 % (ref 0.0–0.2)

## 2018-10-28 MED ORDER — FUROSEMIDE 10 MG/ML IJ SOLN
40.0000 mg | Freq: Two times a day (BID) | INTRAMUSCULAR | Status: DC
  Administered 2018-10-28 (×2): 40 mg via INTRAVENOUS
  Filled 2018-10-28: qty 4

## 2018-10-28 MED ORDER — FUROSEMIDE 20 MG PO TABS
20.0000 mg | ORAL_TABLET | Freq: Every day | ORAL | Status: DC
  Administered 2018-10-29 – 2018-11-02 (×5): 20 mg via ORAL
  Filled 2018-10-28 (×5): qty 1

## 2018-10-28 MED ORDER — NITROGLYCERIN 0.4 MG SL SUBL
SUBLINGUAL_TABLET | SUBLINGUAL | Status: AC
  Administered 2018-10-28: 0.4 mg
  Filled 2018-10-28: qty 1

## 2018-10-28 MED ORDER — IPRATROPIUM-ALBUTEROL 0.5-2.5 (3) MG/3ML IN SOLN
3.0000 mL | Freq: Three times a day (TID) | RESPIRATORY_TRACT | Status: DC
  Administered 2018-10-28 – 2018-11-02 (×14): 3 mL via RESPIRATORY_TRACT
  Filled 2018-10-28 (×16): qty 3

## 2018-10-28 NOTE — Care Management Note (Signed)
Case Management Note  Patient Details  Name: [name] MRN: [id number] Date of Birth: Nov 05, 1950  Subjective/Objective:  Pneumonia                 Action/Plan: Patient lives at home alone; PCP is Dr Bevelyn Buckles; has private insurance with Medicare; pharmacy of choice is [facility]; they deliver medication to her home; she receives transportation via [employer] transportation service. Her son and daughter also helps her with errands and apts; DME - walker at home; she also has a person care service that provides care in the home 7 days a week for 2 hrs; [place] choice offered, pt chose [place]; Dan with [facility] called for arrangements. CM will continue to follow for progression of care.  Expected Discharge Date:    possibly 11/02/2018              Expected Discharge Plan:  [place]  Discharge planning Services  CM Consult  Choice offered to:  Patient  HH Arranged:  RN, Disease Management, PT [place] Agency:  [place] ([facility])  Status of Service:  In process, will continue to follow  Sherrilyn Rist [phone] 10/28/2018, 3:25 PM

## 2018-10-28 NOTE — Plan of Care (Signed)
  Problem: Education: Goal: Knowledge of General Education information will improve Description Including pain rating scale, medication(s)/side effects and non-pharmacologic comfort measures Outcome: Progressing   Problem: Health Behavior/Discharge Planning: Goal: Ability to manage health-related needs will improve Outcome: Progressing   

## 2018-10-28 NOTE — Progress Notes (Signed)
Gave 1 nitro, BP went from 139/85 to 79/39. Gave patient fluids, BP went to 90/39 then 114/78 (manually). Patient doesn't have the pain anymore, feeling light headed (which is also improving).

## 2018-10-28 NOTE — Progress Notes (Signed)
Explained to patient the importance of getting up out of the bed for mobility strength, lung health, and and to help with "gas". Patient declines to sit up saying the chair is uncomfortable.

## 2018-10-28 NOTE — TOC Initial Note (Signed)
Transition of Care [facility]) - Initial/Assessment Note    Patient Details  Name: [name] MRN: [id number] Date of Birth: 17-Mar-1951  Transition of [facility]) CM/SW Contact:    Sherrilyn Rist Phone Number: [phone] 10/28/2018, 3:32 PM  Clinical Narrative:                 Patient lives at home alone; PCP is Dr Bevelyn Buckles; has private insurance with Medicare; pharmacy of choice is [facility]; they deliver medication to her home; she receives transportation via [employer] transportation service. Her son and daughter also helps her with errands and apts; DME - walker at home; she also has a person care service that provides care in the home 7 days a week for 2 hrs; [place] choice offered, pt chose [place]; Dan with [facility] called for arrangements. CM will continue to follow for progression of care.  Expected Discharge Plan: [place]     Patient Goals and CMS Choice Patient states their goals for this hospitalization and ongoing recovery are:: to breathe easier CMS Medicare.gov Compare Post Acute Care list provided to:: Patient Choice offered to / list presented to : Patient  Expected Discharge Plan and Services Expected Discharge Plan: [place] Discharge Planning Services: CM Consult                         HH Arranged: RN, Disease Management, PT [place] Agency: [place] ([facility])  Prior Living Arrangements/Services                       Activities of Daily Living Home Assistive Devices/Equipment: Shower chair with back ADL Screening (condition at time of admission) Patient's cognitive ability adequate to safely complete daily activities?: Yes Is the patient deaf or have difficulty hearing?: No Does the patient have difficulty seeing, even when wearing glasses/contacts?: No Does the patient have difficulty concentrating, remembering, or making decisions?: No Patient able to express  need for assistance with ADLs?: Yes Does the patient have difficulty dressing or bathing?: Yes Independently performs ADLs?: No Does the patient have difficulty walking or climbing stairs?: Yes Weakness of Legs: Both Weakness of Arms/Hands: Both  Permission Sought/Granted                  Emotional Assessment              Admission diagnosis:  Weakness Patient Active Problem List   Diagnosis Date Noted  . Community acquired pneumonia   . Hypotensive episode   . Pneumonia 10/24/2018  . Lumbosacral radiculopathy at L5   . Pain   . CKD (chronic kidney disease), stage II   . Labile blood pressure   . Fever and chills   . Chills   . Postoperative pain   . Stage 3 chronic kidney disease ([place])   . Benign essential HTN   . Acute blood loss anemia   . Neuropathic pain   . Lumbar pars defect   . Fracture of T10 vertebra ([place]) 06/29/2018  . Fever 06/18/2018  . Fracture of neck of left humerus, closed, initial encounter   . Sepsis ([place]) 06/16/2018  . Back pain 06/16/2018  . Humerus fracture 06/16/2018  . Thrombocytopenia ([place]) 06/16/2018  . CKD (chronic kidney disease) stage 3, GFR 30-59 ml/min (HCC) 06/16/2018  . Depression 06/16/2018  . Physical deconditioning 06/16/2018  . Intertrigo 09/01/2017  . Protein-calorie malnutrition, severe  06/13/2017  . 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 Pill dysphagia   . Surgery, elective   . Multiple allergies   . Gram-negative infection   . 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 Obesity hypoventilation syndrome ([place]) 07/02/2016  . CHF (congestive heart failure), NYHA class II, acute on chronic, diastolic ([place]) 71/21/9758  . 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 Pruritic condition 04/15/2016  . Klebsiella infection 02/28/2016  . Proteus infection 02/28/2016  . Chronic pain syndrome 12/07/2015  . Elevated lipase 12/04/2015  . Obstipation 12/04/2015  . Chronic combined systolic and diastolic CHF, NYHA class 1  ([place])   . Anemia due to other cause   . Wound infection after surgery 11/02/2015  . S/P lumbar spinal fusion 10/25/2015  . Morbid obesity ([place]) 03/17/2015  . 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 GERD (gastroesophageal reflux disease) 03/17/2015  . Hyperlipidemia 03/17/2015  . AKI (acute kidney injury) ([place]) 03/17/2015  . Discitis of lumbosacral region 03/06/2015  . Diarrhea 03/06/2015  . IBS (irritable bowel syndrome) 03/06/2015  . Zoster 03/06/2015  . 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 Glenohumeral arthritis 10/24/2011   PCP:  [name], MD Pharmacy:   [facility]- [place], [place] - [address] [place] [address] Phone: [phone] Fax: [phone]  [facility], [place] - [place], [place] - [address] [place] [address] Phone: [phone] Fax: [phone]     Social Determinants of Health ([place]) Interventions    Readmission Risk Interventions 30 Day Unplanned Readmission Risk Score     ED to Hosp-Admission (Current) from 10/24/2018 in [place] CHF  30 Day Unplanned Readmission Risk Score (%)  25 Filed at 10/28/2018 1200     This score is the patient's risk of an unplanned readmission within 30 days of being discharged (0 -100%). The score is based on dignosis, age, lab data, medications, orders, and past utilization.   Low:  0-14.9   Medium: 15-21.9   High: 22-29.9   Extreme: 30 and above       No flowsheet data found.

## 2018-10-28 NOTE — [place] Notes (Signed)
Patient refusing to wear SCDs while asleep.  Education provided and she stated we could put them back on when she woke up in the morning.

## 2018-10-28 NOTE — [place] Notes (Signed)
PROGRESS NOTE    [name]  QVZ:[id number] DOB: 17-Dec-1950 DOA: 10/24/2018 PCP: [name], MD    Brief Narrative:  68 year old female who presented with weakness and dyspnea.  She does have significant past medical history of heart disease stage III, hypertension, dyslipidemia and morbid obesity.  Reported generalized weakness, cough and dyspnea for about 3 days.  Positive fever up to 101 F.  On her initial physical examination she was afebrile, blood pressure 82/60, heart rate 58, respirate 20, oxygen saturation 93% on room air.  Membranes, decreased air entry with basal Rales and rhonchi, no significant wheezing, heart S1-S2 present rhythmic, abdomen protuberant, no lower extremity edema.  Her chest x-ray had faint interstitial opacities at bases bilaterally.  Her respiratory panel was positive for metapneumovirus.   She was admitted to the hospital with a working diagnosis of community-acquired pneumonia.  Assessment & Plan:   Principal Problem:   Pneumonia Active Problems:   Morbid obesity (HCC)   GERD (gastroesophageal reflux disease)   Benign essential HTN   CKD (chronic kidney disease), stage II   Community acquired pneumonia   Hypotensive episode   1. Community acquired pneumonia, present on admission, viral. No signs of over bacterial infection, will discontinue antibiotic therapy, will continue oxymetry monitoring and supplemental 02 per [place]. Continue bronchodilator therapy.   2. Acute on chronic diastolic heart failure. Dyspnea has improved after diuresis, not back to baseline. Urine output over last 24 H is 1,200 ml. Will continue telemetry monitoring. Since admission net fluid balance is negative 3,207 ml. Will resume home dose of furosemide 20 mg daily.   3. CKD stage 2. Stable renal function with serum cr at 1,0 with K at 3,8 and serum bicarbonate at 28. Will follow on renal panel. Continue K correction.   4. HTN. Continue blood pressure control, blood  pressure systolic 329 to 518 mmHg.   5. Morbid obesity. Calculated BMI 44,7. Will need outpatient follow up.   6. Hypokalemia. Continue K correction with Kcl.    DVT prophylaxis: enoxaparin   Code Status: full Family Communication: no family at the bedside  Disposition Plan/ discharge barriers: pending clinical improvement.   Body mass index is 44.71 kg/m. Malnutrition Type:      Malnutrition Characteristics:      Nutrition Interventions:     RN Pressure Injury Documentation:     Consultants:     Procedures:     Antimicrobials:       Subjective: Patient with improved dyspnea, continue to feel very weak and deconditioned, no nausea or vomiting, no chest pain.   Objective: Vitals:   10/28/18 0837 10/28/18 0852 10/28/18 1001 10/28/18 1147  BP: 139/85  124/82 131/87  Pulse: 85  90 76  Resp:    18  Temp:      TempSrc:      SpO2:  99%  94%  Weight:      Height:        Intake/Output Summary (Last 24 hours) at 10/28/2018 1411 Last data filed at 10/28/2018 1308 Gross per 24 hour  Intake 905.13 ml  Output 900 ml  Net 5.13 ml   Filed Weights   10/26/18 0426 10/27/18 0614 10/28/18 0312  Weight: 119.7 kg 119 kg 114.5 kg    Examination:   General: deconditioned  Neurology: Awake and alert, non focal  E ENT: mild pallor, no icterus, oral mucosa moist Cardiovascular: No JVD. S1-S2 present, rhythmic, no gallops, rubs, or murmurs. No lower extremity edema. Pulmonary: decreased breath  sounds bilaterally, decreased air movement, no wheezing, or rhonchi, scattered rales bilaterally. Gastrointestinal. Abdomen protuberant, with no organomegaly, non tender, no rebound or guarding Skin. No rashes Musculoskeletal: no joint deformities     Data Reviewed: I have personally reviewed following labs and imaging studies  CBC: Recent Labs  Lab 10/24/18 1951 10/24/18 2017 10/25/18 0337 10/26/18 0544 10/27/18 0454 10/28/18 0440  WBC 3.7*  --  3.9* 4.5 3.3*  3.9*  NEUTROABS 1.0*  --   --   --   --   --   HGB 12.7 13.3 11.5* 12.4 13.3 13.8  HCT 43.2 39.0 37.6 39.9 42.5 43.1  MCV 86.6  --  85.6 84.5 83.8 83.4  PLT PLATELET CLUMPS NOTED ON SMEAR, COUNT APPEARS DECREASED  --  PLATELET CLUMPS NOTED ON SMEAR, COUNT APPEARS DECREASED 125* 107* 144*   Basic Metabolic Panel: Recent Labs  Lab 10/24/18 1951 10/24/18 2017 10/25/18 0337 10/26/18 0544 10/27/18 0454 10/28/18 0440  NA 136 141 141 139 139 140  K 3.6 4.4 3.6 3.3* 3.4* 3.8  CL 108  --  113* 106 103 102  CO2 19*  --  23 24 23 28   GLUCOSE 107*  --  100* 108* 124* 110*  BUN 6*  --  5* <5* 6* 11  CREATININE 1.12* 1.20* 1.13* 1.08* 1.06* 1.02*  CALCIUM 8.3*  --  7.8* 7.8* 8.7* 8.9   GFR: Estimated Creatinine Clearance: 63.4 mL/min (A) (by C-G formula based on SCr of 1.02 mg/dL (H)). Liver Function Tests: Recent Labs  Lab 10/24/18 1951 10/25/18 0337  AST 18 16  ALT 12 11  ALKPHOS 63 57  BILITOT 0.3 0.5  PROT 6.7 6.0*  ALBUMIN 3.5 3.0*   No results for input(s): LIPASE, AMYLASE in the last 168 hours. No results for input(s): AMMONIA in the last 168 hours. Coagulation Profile: Recent Labs  Lab 10/24/18 1951  INR 1.0   Cardiac Enzymes: No results for input(s): CKTOTAL, CKMB, CKMBINDEX, TROPONINI in the last 168 hours. BNP (last 3 results) No results for input(s): PROBNP in the last 8760 hours. HbA1C: No results for input(s): HGBA1C in the last 72 hours. CBG: No results for input(s): GLUCAP in the last 168 hours. Lipid Profile: No results for input(s): CHOL, HDL, LDLCALC, TRIG, CHOLHDL, LDLDIRECT in the last 72 hours. Thyroid Function Tests: No results for input(s): TSH, T4TOTAL, FREET4, T3FREE, THYROIDAB in the last 72 hours. Anemia Panel: No results for input(s): VITAMINB12, FOLATE, FERRITIN, TIBC, IRON, RETICCTPCT in the last 72 hours.    Radiology Studies: I have reviewed all of the imaging during this hospital visit personally     Scheduled Meds: .  azithromycin  500 mg Oral QHS  . citalopram  20 mg Oral Daily  . fluticasone  2 spray Each Nare Daily  . furosemide  40 mg Intravenous BID  . gabapentin  600 mg Oral TID  . guaiFENesin  1,200 mg Oral BID  . ipratropium-albuterol  3 mL Nebulization BID  . loratadine  10 mg Oral Daily  . methocarbamol  750 mg Oral TID  . metoprolol tartrate  25 mg Oral Daily  . pantoprazole  40 mg Oral Daily  . potassium chloride  40 mEq Oral Daily  . sorbitol  30 mL Oral Once   Continuous Infusions: . sodium chloride Stopped (10/28/18 0848)  . cefTRIAXone (ROCEPHIN)  IV Stopped (10/27/18 1845)     LOS: 4 days        Markale Birdsell Gerome Apley, MD

## 2018-10-28 NOTE — [place] Notes (Signed)
Patient complains of an uncomfortable feeling across her chest/under her breasts. She has been complaining of constipation, and symptoms sound like gas. Getting EKG and will notify MD.

## 2018-10-28 NOTE — Care Management Important Message (Signed)
Important Message  Patient Details  Name: [name] MRN: [id number] Date of Birth: 1951-03-24   Medicare Important Message Given:  Yes    Barb Merino Lorice Lafave 10/28/2018, 4:54 PM

## 2018-10-28 NOTE — [place] Notes (Signed)
MD paged and rapid response.

## 2018-10-29 ENCOUNTER — Encounter: Payer: Self-pay | Admitting: *Deleted

## 2018-10-29 DIAGNOSIS — J129 Viral pneumonia, unspecified: Principal | ICD-10-CM

## 2018-10-29 LAB — BASIC METABOLIC PANEL
Anion gap: 11 (ref 5–15)
BUN: 14 mg/dL (ref 8–23)
CO2: 25 mmol/L (ref 22–32)
Calcium: 9.1 mg/dL (ref 8.9–10.3)
Chloride: 105 mmol/L (ref 98–111)
Creatinine, Ser: 1.04 mg/dL — ABNORMAL HIGH (ref 0.44–1.00)
GFR calc Af Amer: >60 mL/min (ref >60)
GFR calc non Af Amer: 55 mL/min — ABNORMAL LOW (ref >60)
Glucose, Bld: 101 mg/dL — ABNORMAL HIGH (ref 70–99)
Potassium: 4.8 mmol/L (ref 3.5–5.1)
Sodium: 141 mmol/L (ref 135–145)

## 2018-10-29 LAB — CULTURE, BLOOD (ROUTINE X 2)
Culture: NO GROWTH
Culture: NO GROWTH

## 2018-10-29 MED ORDER — MOMETASONE FURO-FORMOTEROL FUM 200-5 MCG/ACT IN AERO
2.0000 | INHALATION_SPRAY | Freq: Two times a day (BID) | RESPIRATORY_TRACT | Status: DC
  Administered 2018-10-29 – 2018-11-02 (×8): 2 via RESPIRATORY_TRACT
  Filled 2018-10-29: qty 8.8

## 2018-10-29 MED ORDER — GUAIFENESIN-DM 100-10 MG/5ML PO SYRP
10.0000 mL | ORAL_SOLUTION | Freq: Four times a day (QID) | ORAL | Status: DC
  Administered 2018-10-29 – 2018-11-02 (×15): 10 mL via ORAL
  Filled 2018-10-29 (×16): qty 10

## 2018-10-29 MED ORDER — OXYCODONE-ACETAMINOPHEN 5-325 MG PO TABS
1.0000 | ORAL_TABLET | ORAL | Status: DC | PRN
  Administered 2018-10-29 – 2018-10-30 (×2): 1 via ORAL
  Filled 2018-10-29 (×2): qty 1

## 2018-10-29 MED ORDER — SODIUM CHLORIDE 3 % IN NEBU
4.0000 mL | INHALATION_SOLUTION | Freq: Two times a day (BID) | RESPIRATORY_TRACT | Status: DC
  Administered 2018-10-29: 4 mL via RESPIRATORY_TRACT
  Administered 2018-10-30: 1 mL via RESPIRATORY_TRACT
  Filled 2018-10-29 (×5): qty 4

## 2018-10-29 NOTE — [place] Notes (Signed)
PROGRESS NOTE    [name]  IRW:[id number] DOB: May 11, 1951 DOA: 10/24/2018 PCP: [name], MD    Brief Narrative:  68 year old female who presented with weakness and dyspnea.  She does have significant past medical history of heart disease stage III, hypertension, dyslipidemia and morbid obesity.  Reported generalized weakness, cough and dyspnea for about 3 days.  Positive fever up to 101 F.  On her initial physical examination she was afebrile, blood pressure 82/60, heart rate 58, respirate 20, oxygen saturation 93% on room air.  Membranes, decreased air entry with basal Rales and rhonchi, no significant wheezing, heart S1-S2 present rhythmic, abdomen protuberant, no lower extremity edema.  Her chest x-ray had faint interstitial opacities at bases bilaterally.  Her respiratory panel was positive for metapneumovirus.   She was admitted to the hospital with a working diagnosis of community-acquired pneumonia.   Assessment & Plan:   Principal Problem:   Pneumonia Active Problems:   Morbid obesity (HCC)   GERD (gastroesophageal reflux disease)   Benign essential HTN   CKD (chronic kidney disease), stage II   Community acquired pneumonia   Hypotensive episode   1. Community acquired pneumonia, present on admission, viral metapneumovirus. Worsening congestion and dyspnea this am, positive diffuse bilateral rhonchi, patient exposed to strong perfume, possible hyper-reactive airways, will continue aggressive bronchodilator therapy, will add hypertonic saline, and flutter valve along chest pt. Increase cough suppression agent dose. Continue oxymetry monitoring and supplemental 02 per [place]. Will add inhaled corticosteroids and long acting b agonist.   2. Acute on chronic diastolic heart failure. Continue diuresis with oral furosemide, continue blood pressure monitoring.  3. CKD stage 2 with hypokalemia. Renal function continue to be stable wit a serum cr at 1,0 with K at 4,8 and  serum bicarbonate at 25.   4. HTN. Blood pressure well controlled with metoprolol.   5. Morbid obesity. Calculated BMI 44,7.      DVT prophylaxis: enoxaparin   Code Status: full Family Communication: no family at the bedside  Disposition Plan/ discharge barriers: pending clinical improvement.   Body mass index is 45.22 kg/m. Malnutrition Type:      Malnutrition Characteristics:      Nutrition Interventions:     RN Pressure Injury Documentation:     Consultants:     Procedures:     Antimicrobials:       Subjective: Patient this am with worsening dyspnea, congestion and cough. Not feeling well, en positive recurrent chest pain. This am exposure to a strong perfume triggered worsening dyspnea and congestion.   Objective: Vitals:   10/28/18 2129 10/29/18 0604 10/29/18 0700 10/29/18 1020  BP:  (!) 158/96  (!) 158/87  Pulse:  90 100 99  Resp:   (!) 24 19  Temp:  98.5 F (36.9 C) 98.5 F (36.9 C) 98.2 F (36.8 C)  TempSrc:  Oral Oral Oral  SpO2: 95% 96% 97% 96%  Weight:  115.8 kg    Height:        Intake/Output Summary (Last 24 hours) at 10/29/2018 1156 Last data filed at 10/29/2018 0900 Gross per 24 hour  Intake 1283.13 ml  Output 950 ml  Net 333.13 ml   Filed Weights   10/27/18 0614 10/28/18 0312 10/29/18 0604  Weight: 119 kg 114.5 kg 115.8 kg    Examination:   General: deconditioned and ill looking appearing  Neurology: Awake and alert, non focal  E ENT: mild pallor, no icterus, oral mucosa moist Cardiovascular: No JVD. S1-S2 present, rhythmic,  no gallops, rubs, or murmurs. Trace lower extremity edema. Pulmonary: positive breath sounds bilaterally, decreased air movement, positive wheezing, with diffuse bilateral rhonchi and scattered rales. Gastrointestinal. Abdomen protuberant with no organomegaly, non tender, no rebound or guarding Skin. No rashes Musculoskeletal: no joint deformities     Data Reviewed: I have personally  reviewed following labs and imaging studies  CBC: Recent Labs  Lab 10/24/18 1951 10/24/18 2017 10/25/18 0337 10/26/18 0544 10/27/18 0454 10/28/18 0440  WBC 3.7*  --  3.9* 4.5 3.3* 3.9*  NEUTROABS 1.0*  --   --   --   --   --   HGB 12.7 13.3 11.5* 12.4 13.3 13.8  HCT 43.2 39.0 37.6 39.9 42.5 43.1  MCV 86.6  --  85.6 84.5 83.8 83.4  PLT PLATELET CLUMPS NOTED ON SMEAR, COUNT APPEARS DECREASED  --  PLATELET CLUMPS NOTED ON SMEAR, COUNT APPEARS DECREASED 125* 107* 035*   Basic Metabolic Panel: Recent Labs  Lab 10/25/18 0337 10/26/18 0544 10/27/18 0454 10/28/18 0440 10/29/18 0540  NA 141 139 139 140 141  K 3.6 3.3* 3.4* 3.8 4.8  CL 113* 106 103 102 105  CO2 23 24 23 28 25   GLUCOSE 100* 108* 124* 110* 101*  BUN 5* <5* 6* 11 14  CREATININE 1.13* 1.08* 1.06* 1.02* 1.04*  CALCIUM 7.8* 7.8* 8.7* 8.9 9.1   GFR: Estimated Creatinine Clearance: 62.7 mL/min (A) (by C-G formula based on SCr of 1.04 mg/dL (H)). Liver Function Tests: Recent Labs  Lab 10/24/18 1951 10/25/18 0337  AST 18 16  ALT 12 11  ALKPHOS 63 57  BILITOT 0.3 0.5  PROT 6.7 6.0*  ALBUMIN 3.5 3.0*   No results for input(s): LIPASE, AMYLASE in the last 168 hours. No results for input(s): AMMONIA in the last 168 hours. Coagulation Profile: Recent Labs  Lab 10/24/18 1951  INR 1.0   Cardiac Enzymes: No results for input(s): CKTOTAL, CKMB, CKMBINDEX, TROPONINI in the last 168 hours. BNP (last 3 results) No results for input(s): PROBNP in the last 8760 hours. HbA1C: No results for input(s): HGBA1C in the last 72 hours. CBG: No results for input(s): GLUCAP in the last 168 hours. Lipid Profile: No results for input(s): CHOL, HDL, LDLCALC, TRIG, CHOLHDL, LDLDIRECT in the last 72 hours. Thyroid Function Tests: No results for input(s): TSH, T4TOTAL, FREET4, T3FREE, THYROIDAB in the last 72 hours. Anemia Panel: No results for input(s): VITAMINB12, FOLATE, FERRITIN, TIBC, IRON, RETICCTPCT in the last 72 hours.     Radiology Studies: I have reviewed all of the imaging during this hospital visit personally     Scheduled Meds: . citalopram  20 mg Oral Daily  . fluticasone  2 spray Each Nare Daily  . furosemide  20 mg Oral Daily  . gabapentin  600 mg Oral TID  . guaiFENesin  1,200 mg Oral BID  . guaiFENesin-dextromethorphan  10 mL Oral Q6H  . ipratropium-albuterol  3 mL Nebulization TID  . loratadine  10 mg Oral Daily  . methocarbamol  750 mg Oral TID  . metoprolol tartrate  25 mg Oral Daily  . mometasone-formoterol  2 puff Inhalation BID  . pantoprazole  40 mg Oral Daily  . sodium chloride HYPERTONIC  4 mL Nebulization BID  . sorbitol  30 mL Oral Once   Continuous Infusions: . sodium chloride Stopped (10/28/18 0848)     LOS: 5 days        Daesha Insco Gerome Apley, MD

## 2018-10-29 NOTE — [place] Notes (Signed)
Physical Therapy Treatment Patient Details Name: [name] MRN: [id number] DOB: [birth year] Today's Date: 10/29/2018    History of Present Illness Pt is a 68 y.o. female admitted 10/24/18 with generalized weakness, cough and SOB; worked up for PNA. PMH includes CKD III, HTN, morbid obesity, chronic back pain; fell 05/2018 with L humeral and L5 spinal fx.    PT Comments    Pt progressing with mobility. Able to amb 21' with RW and intermittent min guard for balance. Remains limited by generalized weakness, fatigue and decreased activity tolerance. DOE 3/4 with minimal activity; SpO2 98% on RA. Max encouragement/education on importance of more frequent OOB mobility with staff.    Follow Up Recommendations  Home health PT;Supervision - Intermittent     Equipment Recommendations  (bariatric-sized RW, 3n1)    Recommendations for Other Services       Precautions / Restrictions Precautions Precautions: Fall Restrictions Weight Bearing Restrictions: No    Mobility  Bed Mobility Overal bed mobility: Modified Independent Bed Mobility: Supine to Sit Rolling: Modified independent (Device/Increase time)         General bed mobility comments: Increased time and effort, HOB elevated. Pt left sitting EOB at end of session  Transfers Overall transfer level: Needs assistance Equipment used: Rolling walker (2 wheeled) Transfers: Sit to/from Stand Sit to Stand: Supervision         General transfer comment: Stood from bed 2x and BSC 1x with RW and supervision for safety. Increased time and effort due to fatigue  Ambulation/Gait Ambulation/Gait assistance: Supervision;Min guard Gait Distance (Feet): 72 Feet Assistive device: Rolling walker (2 wheeled) Gait Pattern/deviations: Step-through pattern;Decreased stride length;Wide base of support Gait velocity: Decreased Gait velocity interpretation: <1.8 ft/sec, indicate of risk for recurrent falls General Gait Details: Slow, labored  gait with RW and intermittent min guard for balance; further distance limited by fatigue. DOE 3/4, SpO2 98% on RA   Stairs             Wheelchair Mobility    Modified Rankin (Stroke Patients Only)       Balance Overall balance assessment: Needs assistance Sitting-balance support: No upper extremity supported;Feet supported Sitting balance-Leahy Scale: Fair     Standing balance support: During functional activity;Single extremity supported Standing balance-Leahy Scale: Poor Standing balance comment: Reliant on UE support with partial standing to perform pericare without assist                            Cognition Arousal/Alertness: Awake/alert Behavior During Therapy: WFL for tasks assessed/performed Overall Cognitive Status: Within Functional Limits for tasks assessed                                        Exercises      General Comments        Pertinent Vitals/Pain Pain Assessment: Faces Faces Pain Scale: Hurts a little bit Pain Location: Generalized Pain Descriptors / Indicators: Discomfort Pain Intervention(s): Limited activity within patient's tolerance;Monitored during session    Home Living                      Prior Function            PT Goals (current goals can now be found in the care plan section) Acute Rehab PT Goals Patient Stated Goal: home PT Goal Formulation: With patient Time  For Goal Achievement: 11/09/18 Potential to Achieve Goals: Good Progress towards PT goals: Progressing toward goals    Frequency    Min 3X/week      PT Plan Current plan remains appropriate    Co-evaluation              AM-PAC PT "6 Clicks" Mobility   Outcome Measure  Help needed turning from your back to your side while in a flat bed without using bedrails?: None Help needed moving from lying on your back to sitting on the side of a flat bed without using bedrails?: None Help needed moving to and from a  bed to a chair (including a wheelchair)?: A Little Help needed standing up from a chair using your arms (e.g., wheelchair or bedside chair)?: A Little Help needed to walk in hospital room?: A Little Help needed climbing 3-5 steps with a railing? : A Lot 6 Click Score: 19    End of Session Equipment Utilized During Treatment: Gait belt Activity Tolerance: Patient tolerated treatment well Patient left: in bed;with call bell/phone within reach Nurse Communication: Mobility status PT Visit Diagnosis: History of falling (Z91.81);Muscle weakness (generalized) (M62.81);Difficulty in walking, not elsewhere classified (R26.2);Other abnormalities of gait and mobility (R26.89)     Time: 1572-6203 PT Time Calculation (min) (ACUTE ONLY): 16 min  Charges:  $Gait Training: 8-22 mins                    Mabeline Caras, PT, DPT Acute Rehabilitation Services  Pager [phone] Office [place] 10/29/2018, 1:57 PM

## 2018-10-29 NOTE — Plan of Care (Signed)
  Problem: Health Behavior/Discharge Planning: Goal: Ability to manage health-related needs will improve Outcome: Progressing   Problem: Activity: Goal: Risk for activity intolerance will decrease Outcome: Progressing   Problem: Nutrition: Goal: Adequate nutrition will be maintained Outcome: Progressing   Problem: Coping: Goal: Level of anxiety will decrease Outcome: Progressing   

## 2018-10-29 NOTE — Consult Note (Addendum)
    [facility] CM Inpatient Consult   10/29/2018  [name] [name] 1951-02-23 [id number]    Addendum:  Notified by [facility] office patient is no longer eligible.  Roster checked and patient is not currently a beneficiary of [facility] services.  Patient was notified and verbalized understanding. Transition of care RN CM was made aware of it.    Patient evaluated for high risk score of 26% for unplanned readmission and hospitalizations with her Next [employer]/ Medicare benefits. Patient also confirmed having Medicaid.  Patient presented with generalized weakness, some cough and shortness of breath and was admitted to the hospital for treatment of community-acquired pneumonia, acute on chronic diastolic dysfunction with congestive HF. Per MD's history and physical dated 10/24/18, patient is a 68 y.o.femalewith medical history significant ofchronic kidney disease stage III, hypertension, hyperlipidemia,morbid obesity, significant back pain with chronic pain.  Went to bedside to discuss and offer [facility] services.  Patient reports living alone and has home aide (unable to recall agency name) assistance 7 days a week for 2 hours. Patient manages her medications at home with home aides' assistance. She states using Medicaid transportation benefits. Patient states she is unable to weigh daily due to non-functional weighing scale.  Discharge disposition is home with home health services through [facility].  Patient is agreeable with [facility] services and written consent signed. Explained to patient that she will receive post hospital transition of care calls for further assessment needs. Confirmed Primary Care provider as Dr. [name] with [facility]  Patient confirmed best contact number as [phone]. Explained that [facility] will not interfere or replace services provided by home health.  Left [facility] Care Management packet and contact information at  bedside. Will make transition of care RN CM aware that patient will be followed by [facility] post hospital discharge.    Rhyli Depaula A. Lekia Nier, BSN, RN-BC [facility] Liaison Cell: [phone]

## 2018-10-30 LAB — BASIC METABOLIC PANEL
Anion gap: 12 (ref 5–15)
BUN: 14 mg/dL (ref 8–23)
CO2: 24 mmol/L (ref 22–32)
Calcium: 9.4 mg/dL (ref 8.9–10.3)
Chloride: 102 mmol/L (ref 98–111)
Creatinine, Ser: 1.06 mg/dL — ABNORMAL HIGH (ref 0.44–1.00)
GFR calc non Af Amer: 54 mL/min — ABNORMAL LOW (ref >60)
Glucose, Bld: 95 mg/dL (ref 70–99)
Potassium: 4.3 mmol/L (ref 3.5–5.1)
Sodium: 138 mmol/L (ref 135–145)

## 2018-10-30 MED ORDER — HYDROXYZINE HCL 25 MG PO TABS
25.0000 mg | ORAL_TABLET | Freq: Three times a day (TID) | ORAL | Status: DC | PRN
  Administered 2018-10-30: 25 mg via ORAL
  Filled 2018-10-30: qty 1

## 2018-10-30 NOTE — Progress Notes (Signed)
PROGRESS NOTE    [name]  GXQ:[id number] DOB: 12/05/1950 DOA: 10/24/2018 PCP: [name], MD    Brief Narrative:  68 year old female who presented with weakness and dyspnea. She does have significant past medical history of heart disease stage III, hypertension, dyslipidemia and morbid obesity. Reported generalized weakness, cough and dyspnea for about 3 days. Positive fever up to 101 F. On her initial physical examination she was afebrile, blood pressure 82/60, heart rate 58, respirate 20, oxygen saturation 93% on room air. Membranes, decreased air entry with basal Rales and rhonchi, no significant wheezing, heart S1-S2 present rhythmic, abdomen protuberant, no lower extremity edema.Her chest x-ray had faint interstitial opacities at bases bilaterally.Her respiratory panel was positive for metapneumovirus.  She was admitted to the hospital with a working diagnosis of community-acquired pneumonia.   Assessment & Plan:   Principal Problem:   Pneumonia Active Problems:   Morbid obesity (HCC)   GERD (gastroesophageal reflux disease)   Benign essential HTN   CKD (chronic kidney disease), stage II   Community acquired pneumonia   Hypotensive episode  1. Community acquired pneumonia, present on admission, viral metapneumovirus.Improved dyspnea and lung examination today, not yet back to baseline, will continue bronchodilator therapy with duoneb, hypertonic saline and inhaled corticosteroids. Suspected hyper-reactive airway disease. Pulmonary chest physical therapy and flutter valve. Out of bed to the chair.   2. Acute on chronic diastolic heart failure. Improved volume status, will continue diuresis with oral furosemide.   3. CKD stage 2 with hypokalemia. Renal function has remained stable with serum cr at 1,06. K at 4,3, will continue close follow up of renal function and electrolytes.   4. HTN. Continue metoprolol for blood pressure control.   5. Morbid  obesity. Calculated BMI 44,7.     DVT prophylaxis:enoxaparin Code Status:full Family Communication:no family at the bedside Disposition Plan/ discharge barriers:pending clinical improvement.   Body mass index is 44.71 kg/m. Malnutrition Type:      Malnutrition Characteristics:      Nutrition Interventions:     RN Pressure Injury Documentation:     Consultants:     Procedures:     Antimicrobials:       Subjective: Patient with improved congestion and cough, not yet back to baseline, she out of bed to the chair and tolerating po well, no nausea or vomiting.   Objective: Vitals:   10/29/18 2038 10/29/18 2042 10/30/18 0500 10/30/18 0559  BP:  116/62  (!) 148/76  Pulse: 78 79  86  Resp: 18 18  18   Temp:  98.1 F (36.7 C)  98 F (36.7 C)  TempSrc:  Oral  Oral  SpO2: 96% 99%  96%  Weight:   114.5 kg   Height:        Intake/Output Summary (Last 24 hours) at 10/30/2018 1011 Last data filed at 10/30/2018 0600 Gross per 24 hour  Intake 720 ml  Output 700 ml  Net 20 ml   Filed Weights   10/28/18 0312 10/29/18 0604 10/30/18 0500  Weight: 114.5 kg 115.8 kg 114.5 kg    Examination:   General: deconditioned  Neurology: Awake and alert, non focal  E ENT: mild pallor, no icterus, oral mucosa moist Cardiovascular: No JVD. S1-S2 present, rhythmic, no gallops, rubs, or murmurs. Trace lower extremity edema. Pulmonary: positive breath sounds bilaterally, decreased air movement, no wheezing, only scattered rales with no significant rhonchi today. Gastrointestinal. Abdomen protuberant with no organomegaly, non tender, no rebound or guarding Skin. No rashes Musculoskeletal: no joint  deformities     Data Reviewed: I have personally reviewed following labs and imaging studies  CBC: Recent Labs  Lab 10/24/18 1951 10/24/18 2017 10/25/18 0337 10/26/18 0544 10/27/18 0454 10/28/18 0440  WBC 3.7*  --  3.9* 4.5 3.3* 3.9*  NEUTROABS 1.0*  --    --   --   --   --   HGB 12.7 13.3 11.5* 12.4 13.3 13.8  HCT 43.2 39.0 37.6 39.9 42.5 43.1  MCV 86.6  --  85.6 84.5 83.8 83.4  PLT PLATELET CLUMPS NOTED ON SMEAR, COUNT APPEARS DECREASED  --  PLATELET CLUMPS NOTED ON SMEAR, COUNT APPEARS DECREASED 125* 107* 701*   Basic Metabolic Panel: Recent Labs  Lab 10/26/18 0544 10/27/18 0454 10/28/18 0440 10/29/18 0540 10/30/18 0443  NA 139 139 140 141 138  K 3.3* 3.4* 3.8 4.8 4.3  CL 106 103 102 105 102  CO2 24 23 28 25 24   GLUCOSE 108* 124* 110* 101* 95  BUN <5* 6* 11 14 14   CREATININE 1.08* 1.06* 1.02* 1.04* 1.06*  CALCIUM 7.8* 8.7* 8.9 9.1 9.4   GFR: Estimated Creatinine Clearance: 61 mL/min (A) (by C-G formula based on SCr of 1.06 mg/dL (H)). Liver Function Tests: Recent Labs  Lab 10/24/18 1951 10/25/18 0337  AST 18 16  ALT 12 11  ALKPHOS 63 57  BILITOT 0.3 0.5  PROT 6.7 6.0*  ALBUMIN 3.5 3.0*   No results for input(s): LIPASE, AMYLASE in the last 168 hours. No results for input(s): AMMONIA in the last 168 hours. Coagulation Profile: Recent Labs  Lab 10/24/18 1951  INR 1.0   Cardiac Enzymes: No results for input(s): CKTOTAL, CKMB, CKMBINDEX, TROPONINI in the last 168 hours. BNP (last 3 results) No results for input(s): PROBNP in the last 8760 hours. HbA1C: No results for input(s): HGBA1C in the last 72 hours. CBG: No results for input(s): GLUCAP in the last 168 hours. Lipid Profile: No results for input(s): CHOL, HDL, LDLCALC, TRIG, CHOLHDL, LDLDIRECT in the last 72 hours. Thyroid Function Tests: No results for input(s): TSH, T4TOTAL, FREET4, T3FREE, THYROIDAB in the last 72 hours. Anemia Panel: No results for input(s): VITAMINB12, FOLATE, FERRITIN, TIBC, IRON, RETICCTPCT in the last 72 hours.    Radiology Studies: I have reviewed all of the imaging during this hospital visit personally     Scheduled Meds: . citalopram  20 mg Oral Daily  . fluticasone  2 spray Each Nare Daily  . furosemide  20 mg Oral  Daily  . gabapentin  600 mg Oral TID  . guaiFENesin  1,200 mg Oral BID  . guaiFENesin-dextromethorphan  10 mL Oral Q6H  . ipratropium-albuterol  3 mL Nebulization TID  . loratadine  10 mg Oral Daily  . methocarbamol  750 mg Oral TID  . metoprolol tartrate  25 mg Oral Daily  . mometasone-formoterol  2 puff Inhalation BID  . pantoprazole  40 mg Oral Daily  . sodium chloride HYPERTONIC  4 mL Nebulization BID  . sorbitol  30 mL Oral Once   Continuous Infusions: . sodium chloride Stopped (10/28/18 0848)     LOS: 6 days         Gerome Apley, MD

## 2018-10-30 NOTE — Progress Notes (Signed)
Nurse called report to [place]. Pt will be transported after shift change.

## 2018-10-31 MED ORDER — METHYLPREDNISOLONE SODIUM SUCC 40 MG IJ SOLR
40.0000 mg | INTRAMUSCULAR | Status: DC
  Administered 2018-10-31 – 2018-11-01 (×2): 40 mg via INTRAVENOUS
  Filled 2018-10-31 (×2): qty 1

## 2018-10-31 NOTE — Progress Notes (Signed)
PROGRESS NOTE    [name]  YQM:[id number] DOB: [birth year] DOA: 10/24/2018 PCP: [name], MD    Brief Narrative:  68 year old female who presented with weakness and dyspnea. She does have significant past medical history of heart disease stage III, hypertension, dyslipidemia and morbid obesity. Reported generalized weakness, cough and dyspnea for about 3 days. Positive fever up to 101 F. On her initial physical examination she was afebrile, blood pressure 82/60, heart rate 58, respirate 20, oxygen saturation 93% on room air. Membranes, decreased air entry with basal Rales and rhonchi, no significant wheezing, heart S1-S2 present rhythmic, abdomen protuberant, no lower extremity edema.Her chest x-ray had faint interstitial opacities at bases bilaterally.Her respiratory panel was positive for metapneumovirus.  She was admitted to the hospital with a working diagnosis of community-acquired pneumonia.    Assessment & Plan:   Principal Problem:   Pneumonia Active Problems:   Morbid obesity (HCC)   GERD (gastroesophageal reflux disease)   Benign essential HTN   CKD (chronic kidney disease), stage II   Community acquired pneumonia   Hypotensive episode  1. Community acquired pneumonia, present on admission, viralmetapneumovirus./ Complicated with hyper-reactive airway disease. Continue bronchodilator therapy with duoneb, hypertonic saline and inhaled corticosteroids. patient with more wheezing today and continue rhonchi, suspected hyper-reactive airway disease, will add systemic corticosteroids, with methylprednisolone 40 MG IV.  Continue chest physical therapy and flutter valve. Out of bed to the chair. Will need outpatient PFT. Continue guaifenesin.   2. Acute on chronic diastolic heart failure. Continue with oral furosemide, today patient euvolemic.   3. CKD stage 2with hypokalemia. No renal function today, patient has been tolerating po well. Continue to  avoid nephrotoxic medications, continue furosemide po per home dose.   4. HTN.Blood pressure control with metoprolol.   5. Morbid obesity. Calculated BMI 44,7.  6. Depression. Continue citalpram.   DVT prophylaxis:enoxaparin Code Status:full Family Communication:no family at the bedside Disposition Plan/ discharge barriers:pending clinical improvement.    Body mass index is 45.03 kg/m. Malnutrition Type:      Malnutrition Characteristics:      Nutrition Interventions:     RN Pressure Injury Documentation:     Consultants:     Procedures:     Antimicrobials:       Subjective: Patient this am is feeling with worsening congestion and wheezing, positive cough, no nausea or vomiting. Feeling worse than yesterday.   Objective: Vitals:   10/30/18 2020 10/31/18 0525 10/31/18 0539 10/31/18 0859  BP:   (!) 143/76   Pulse:   68 80  Resp:   17 18  Temp:   97.6 F (36.4 C)   TempSrc:   Oral   SpO2: 96%  98% 93%  Weight:  115.3 kg    Height:        Intake/Output Summary (Last 24 hours) at 10/31/2018 1115 Last data filed at 10/31/2018 1015 Gross per 24 hour  Intake 720 ml  Output 300 ml  Net 420 ml   Filed Weights   10/29/18 0604 10/30/18 0500 10/31/18 0525  Weight: 115.8 kg 114.5 kg 115.3 kg    Examination:   General: deconditioned and ill looking appearing  Neurology: Awake and alert, non focal  E ENT: mild pallor, no icterus, oral mucosa moist Cardiovascular: No JVD. S1-S2 present, rhythmic, no gallops, rubs, or murmurs. Trace lower extremity edema. Pulmonary: positive breath sounds bilaterally, poor air movement, positive expiratory wheezing, with bilateral rhonchi and rales. Gastrointestinal. Abdomen with no organomegaly, non tender, no rebound or  guarding Skin. No rashes Musculoskeletal: no joint deformities     Data Reviewed: I have personally reviewed following labs and imaging studies  CBC: Recent Labs  Lab  10/24/18 1951 10/24/18 2017 10/25/18 0337 10/26/18 0544 10/27/18 0454 10/28/18 0440  WBC 3.7*  --  3.9* 4.5 3.3* 3.9*  NEUTROABS 1.0*  --   --   --   --   --   HGB 12.7 13.3 11.5* 12.4 13.3 13.8  HCT 43.2 39.0 37.6 39.9 42.5 43.1  MCV 86.6  --  85.6 84.5 83.8 83.4  PLT PLATELET CLUMPS NOTED ON SMEAR, COUNT APPEARS DECREASED  --  PLATELET CLUMPS NOTED ON SMEAR, COUNT APPEARS DECREASED 125* 107* 272*   Basic Metabolic Panel: Recent Labs  Lab 10/26/18 0544 10/27/18 0454 10/28/18 0440 10/29/18 0540 10/30/18 0443  NA 139 139 140 141 138  K 3.3* 3.4* 3.8 4.8 4.3  CL 106 103 102 105 102  CO2 24 23 28 25 24   GLUCOSE 108* 124* 110* 101* 95  BUN <5* 6* 11 14 14   CREATININE 1.08* 1.06* 1.02* 1.04* 1.06*  CALCIUM 7.8* 8.7* 8.9 9.1 9.4   GFR: Estimated Creatinine Clearance: 61.4 mL/min (A) (by C-G formula based on SCr of 1.06 mg/dL (H)). Liver Function Tests: Recent Labs  Lab 10/24/18 1951 10/25/18 0337  AST 18 16  ALT 12 11  ALKPHOS 63 57  BILITOT 0.3 0.5  PROT 6.7 6.0*  ALBUMIN 3.5 3.0*   No results for input(s): LIPASE, AMYLASE in the last 168 hours. No results for input(s): AMMONIA in the last 168 hours. Coagulation Profile: Recent Labs  Lab 10/24/18 1951  INR 1.0   Cardiac Enzymes: No results for input(s): CKTOTAL, CKMB, CKMBINDEX, TROPONINI in the last 168 hours. BNP (last 3 results) No results for input(s): PROBNP in the last 8760 hours. HbA1C: No results for input(s): HGBA1C in the last 72 hours. CBG: No results for input(s): GLUCAP in the last 168 hours. Lipid Profile: No results for input(s): CHOL, HDL, LDLCALC, TRIG, CHOLHDL, LDLDIRECT in the last 72 hours. Thyroid Function Tests: No results for input(s): TSH, T4TOTAL, FREET4, T3FREE, THYROIDAB in the last 72 hours. Anemia Panel: No results for input(s): VITAMINB12, FOLATE, FERRITIN, TIBC, IRON, RETICCTPCT in the last 72 hours.    Radiology Studies: I have reviewed all of the imaging during this  hospital visit personally     Scheduled Meds: . citalopram  20 mg Oral Daily  . fluticasone  2 spray Each Nare Daily  . furosemide  20 mg Oral Daily  . gabapentin  600 mg Oral TID  . guaiFENesin  1,200 mg Oral BID  . guaiFENesin-dextromethorphan  10 mL Oral Q6H  . ipratropium-albuterol  3 mL Nebulization TID  . loratadine  10 mg Oral Daily  . methocarbamol  750 mg Oral TID  . methylPREDNISolone (SOLU-MEDROL) injection  40 mg Intravenous Q24H  . metoprolol tartrate  25 mg Oral Daily  . mometasone-formoterol  2 puff Inhalation BID  . pantoprazole  40 mg Oral Daily  . sorbitol  30 mL Oral Once   Continuous Infusions: . sodium chloride Stopped (10/28/18 0848)     LOS: 7 days         Gerome Apley, MD

## 2018-11-01 NOTE — Progress Notes (Signed)
PROGRESS NOTE    [name]  BSW:[id number] DOB: 04-06-1951 DOA: 10/24/2018 PCP: [name], MD    Brief Narrative:  68 year old female who presented with weakness and dyspnea. She does have significant past medical history of heart disease stage III, hypertension, dyslipidemia and morbid obesity. Reported generalized weakness, cough and dyspnea for about 3 days. Positive fever up to 101 F. On her initial physical examination she was afebrile, blood pressure 82/60, heart rate 58, respirate 20, oxygen saturation 93% on room air. Membranes, decreased air entry with basal Rales and rhonchi, no significant wheezing, heart S1-S2 present rhythmic, abdomen protuberant, no lower extremity edema.Her chest x-ray had faint interstitial opacities at bases bilaterally.Her respiratory panel was positive for metapneumovirus.  She was admitted to the hospital with a working diagnosis of community-acquired pneumonia.   Assessment & Plan:   Principal Problem:   Pneumonia Active Problems:   Morbid obesity (HCC)   GERD (gastroesophageal reflux disease)   Benign essential HTN   CKD (chronic kidney disease), stage II   Community acquired pneumonia   Hypotensive episode   1. Community acquired pneumonia, present on admission, viralmetapneumovirus./ Complicated with hyper-reactive airway disease. Patient with persistent wheezing today, will continue bronchodilator therapy with duoneb, hypertonic saline and inhaled and systemic corticosteroids. With chest physical therapy and using flutter valve. Cough control with guaifenesin DM. Out of bed as tolerated and ambulation with physical therapy, will need home health services.   2. Acute on chronic diastolic heart failure. Clinically euvolemic, continue blood pressure control with metoprolol and oral furosemide.   3. CKD stage 2with hypokalemia. Renal functio has been stable. Continue oral furosemide.   4. HTN.Blood pressure control  with metoprolol.   5. Morbid obesity. Calculated BMI 44,7.Home health services.   6. Depression. Continue with citalpram.   DVT prophylaxis:enoxaparin Code Status:full Family Communication:no family at the bedside Disposition Plan/ discharge barriers:possible dc in am.    Body mass index is 45.03 kg/m. Malnutrition Type:      Malnutrition Characteristics:      Nutrition Interventions:     RN Pressure Injury Documentation:     Consultants:     Procedures:     Antimicrobials:       Subjective: Patient continue to have wheezing and dyspnea on exertion, very fatigued and weak, no nausea or vomiting, no chest pain.   Objective: Vitals:   10/31/18 1439 10/31/18 1526 10/31/18 2029 11/01/18 0507  BP: (!) 144/94  (!) 156/85 138/70  Pulse: 83 81 86 74  Resp:  16 18 20   Temp: 98.4 F (36.9 C)  98.1 F (36.7 C) 98.3 F (36.8 C)  TempSrc: Oral  Oral Oral  SpO2: 98% 97% 97% 100%  Weight:      Height:        Intake/Output Summary (Last 24 hours) at 11/01/2018 4665 Last data filed at 11/01/2018 9935 Gross per 24 hour  Intake 480 ml  Output -  Net 480 ml   Filed Weights   10/29/18 0604 10/30/18 0500 10/31/18 0525  Weight: 115.8 kg 114.5 kg 115.3 kg    Examination:   General: Not in pain or dyspnea, deconditioned  Neurology: Awake and alert, non focal  E ENT: mild pallor, no icterus, oral mucosa moist Cardiovascular: No JVD. S1-S2 present, rhythmic, no gallops, rubs, or murmurs. Trace lower extremity edema. Pulmonary: positive breath sounds bilaterally, decreased air movement, positive expiratory wheezing, with scattered rhonchi and rales. Gastrointestinal. Abdomen with no organomegaly, non tender, no rebound or guarding Skin. No  rashes Musculoskeletal: no joint deformities     Data Reviewed: I have personally reviewed following labs and imaging studies  CBC: Recent Labs  Lab 10/26/18 0544 10/27/18 0454 10/28/18 0440  WBC  4.5 3.3* 3.9*  HGB 12.4 13.3 13.8  HCT 39.9 42.5 43.1  MCV 84.5 83.8 83.4  PLT 125* 107* 371*   Basic Metabolic Panel: Recent Labs  Lab 10/26/18 0544 10/27/18 0454 10/28/18 0440 10/29/18 0540 10/30/18 0443  NA 139 139 140 141 138  K 3.3* 3.4* 3.8 4.8 4.3  CL 106 103 102 105 102  CO2 24 23 28 25 24   GLUCOSE 108* 124* 110* 101* 95  BUN <5* 6* 11 14 14   CREATININE 1.08* 1.06* 1.02* 1.04* 1.06*  CALCIUM 7.8* 8.7* 8.9 9.1 9.4   GFR: Estimated Creatinine Clearance: 61.4 mL/min (A) (by C-G formula based on SCr of 1.06 mg/dL (H)). Liver Function Tests: No results for input(s): AST, ALT, ALKPHOS, BILITOT, PROT, ALBUMIN in the last 168 hours. No results for input(s): LIPASE, AMYLASE in the last 168 hours. No results for input(s): AMMONIA in the last 168 hours. Coagulation Profile: No results for input(s): INR, PROTIME in the last 168 hours. Cardiac Enzymes: No results for input(s): CKTOTAL, CKMB, CKMBINDEX, TROPONINI in the last 168 hours. BNP (last 3 results) No results for input(s): PROBNP in the last 8760 hours. HbA1C: No results for input(s): HGBA1C in the last 72 hours. CBG: No results for input(s): GLUCAP in the last 168 hours. Lipid Profile: No results for input(s): CHOL, HDL, LDLCALC, TRIG, CHOLHDL, LDLDIRECT in the last 72 hours. Thyroid Function Tests: No results for input(s): TSH, T4TOTAL, FREET4, T3FREE, THYROIDAB in the last 72 hours. Anemia Panel: No results for input(s): VITAMINB12, FOLATE, FERRITIN, TIBC, IRON, RETICCTPCT in the last 72 hours.    Radiology Studies: I have reviewed all of the imaging during this hospital visit personally     Scheduled Meds: . citalopram  20 mg Oral Daily  . fluticasone  2 spray Each Nare Daily  . furosemide  20 mg Oral Daily  . gabapentin  600 mg Oral TID  . guaiFENesin  1,200 mg Oral BID  . guaiFENesin-dextromethorphan  10 mL Oral Q6H  . ipratropium-albuterol  3 mL Nebulization TID  . loratadine  10 mg Oral Daily   . methocarbamol  750 mg Oral TID  . methylPREDNISolone (SOLU-MEDROL) injection  40 mg Intravenous Q24H  . metoprolol tartrate  25 mg Oral Daily  . mometasone-formoterol  2 puff Inhalation BID  . pantoprazole  40 mg Oral Daily  . sorbitol  30 mL Oral Once   Continuous Infusions: . sodium chloride Stopped (10/28/18 0848)     LOS: 8 days        Dawnn Nam Gerome Apley, MD

## 2018-11-02 MED ORDER — GUAIFENESIN-DM 100-10 MG/5ML PO SYRP: 5.0000 mL | ORAL_SOLUTION | Freq: Four times a day (QID) | ORAL | 0 refills | Status: AC | PRN

## 2018-11-02 MED ORDER — PREDNISONE 20 MG PO TABS: 20.0000 mg | ORAL_TABLET | Freq: Every day | ORAL | 0 refills | Status: AC

## 2018-11-02 MED ORDER — IPRATROPIUM-ALBUTEROL 0.5-2.5 (3) MG/3ML IN SOLN: 3.0000 mL | Freq: Four times a day (QID) | RESPIRATORY_TRACT | 0 refills | Status: AC | PRN

## 2018-11-02 MED ORDER — IPRATROPIUM-ALBUTEROL 0.5-2.5 (3) MG/3ML IN SOLN: 3.0000 mL | Freq: Two times a day (BID) | RESPIRATORY_TRACT | Status: DC

## 2018-11-02 NOTE — Progress Notes (Signed)
Janey Loni Dolly to be D/C'd  per MD order. Discussed with the patient and all questions fully answered.  VSS, Skin clean, dry and intact without evidence of skin break down, no evidence of skin tears noted.  IV catheter discontinued intact. Site without signs and symptoms of complications. Dressing and pressure applied.  An After Visit Summary was printed and given to the patient. Patient received prescription.  D/c education completed with patient/family including follow up instructions, medication list, d/c activities limitations if indicated, with other d/c instructions as indicated by MD - patient able to verbalize understanding, all questions fully answered.   Patient instructed to return to ED, call 911, or call MD for any changes in condition.   Patient to be escorted via [place], and D/C home via private auto.

## 2018-11-02 NOTE — Discharge Summary (Signed)
Physician Discharge Summary  [name] TLX:[id number] DOB: 27-Jul-1951 DOA: 10/24/2018  PCP: [name], MD  Admit date: 10/24/2018 Discharge date: 11/02/2018  Admitted From: Home  Disposition:  Home   Recommendations for Outpatient Follow-up and new medication changes:  1. Follow up with Dr. Philip Aspen in 7 days.  2. Patient will take prednisone 20 mg daily for 3 more days, for hyperreactive airway.  3. Added albuterol/ ipratropium and a nebulizer machine was prescribed. 4. Patient will benefit from outpatient full pulmonary function testing.   Home Health:  Yes  Equipment/Devices: nebulizer   Discharge Condition: stable  CODE STATUS: full  Diet recommendation: heart healthy   Brief/Interim Summary: 68 year old female who presented with weakness and dyspnea. She does have significant past medical history of chronic kidney disease stage 2, hypertension, dyslipidemia and morbid obesity. Reported generalized weakness, cough and dyspnea for about 3 days. Positive fever up to 101 F. On her initial physical examination she was afebrile, blood pressure 82/60, heart rate 58, respiratory rate  20, oxygen saturation 93% on room air. She had dcreased air entry with basalar rales and rhonchi, no significant wheezing, heart S1-S2 present rhythmic, abdomen protuberant, no lower extremity edema. Sodium 136, potassium 3.7, chloride 108, bicarb 19, glucose 107, BUN 6, creatinine 1.1, white count 3.7, hemoglobin 12.7, hematocrit 43.2, platelets clumped, her urinalysis had 6-10 white cells, 21-50 red cells. Her chest x-ray had faint interstitial opacities at bases bilaterally.Her respiratory panel was positive for metapneumovirus. Her initial EKG showed rate of 62 bpm, sinus rhythm with normal axis, QTC was prolonged at 526, repeat EKG from March 11 Qtc, 489.  She was admitted to the hospital with a working diagnosis of community-acquired pneumonia.  1.  Community-acquired viral pneumonia,  present on admission, meta pneumovirus/ complicated by hyperreactive airway disease.  Patient was admitted to the medical ward, she was placed on a remote telemetry monitor, she was placed on broad-spectrum antibiotic therapy with IV ceftriaxone and p azithromycin.  She had persistent wheezing that improved with bronchodilator therapy along with systemic steroids, suggesting hyperreactive airway disease.  She had a slow recovery, deemed stable to be discharged March 16.  At her discharge she had completed her antibiotic therapy.  2.  Acute on chronic diastolic heart failure.  She did receive diuresis for signs of volume overload, known to have a preserved LV systolic function.  At home she will continue labetalol.  3.  Chronic kidney disease stage II with hypokalemia.  Patient tolerated well diuresis with furosemide, potassium was corrected.  Her discharge creatinine is 1.0, potassium 4.3, serum bicarb 24.  4.  Hypertension.  Blood pressure remained well controlled.  Patient will resume clonidine, labetalol and losartan.  5.  Morbid obesity/calculated BMI 44.7.  6.  Depression.  Continue citalopram.  Discharge Diagnoses:  Principal Problem:   Pneumonia Active Problems:   Morbid obesity (HCC)   GERD (gastroesophageal reflux disease)   Benign essential HTN   CKD (chronic kidney disease), stage II   Community acquired pneumonia   Hypotensive episode    Discharge Instructions  Discharge Instructions    AMB Referral to [facility]   Complete by:  As directed    Please assign to [facility] care coordinator (RN) for complex disease management. Primary care provider's office does transition of care (TOC). Contact information confirmed as [phone]. Consent signed.   Reason for consult:  Referral for [facility] community RN regarding HF and pneumonia follow-up   Diagnoses of:   Heart Failure  COPD/ Pneumonia     Expected date of contact:  1-3 days (reserved for hospital  discharges)     Allergies as of 11/02/2018      Reactions   Vancomycin Other (See Comments)   Drug induced NEUTROPENIA   Aspirin Other (See Comments)   Stomach bleeding   Ibuprofen Other (See Comments)   Stomach bleeding   Mushroom Extract Complex Hives, Itching   Shellfish Allergy Hives, Itching   Sulfa Antibiotics Hives, Itching   Sulfasalazine Itching, Hives   Tylenol [acetaminophen] Itching   Betadine [povidone Iodine] Itching   Coconut Flavor Itching   Codeine Itching   Eggs Or Egg-derived Products Nausea And Vomiting   Iodine Itching   Ivp Dye [iodinated Diagnostic Agents] Hives   Takes Benadryl 50mg  PO before receiving iodinated contrast   Metrizamide Hives   Takes Benadryl 50mg  PO before receiving iodinated contrast      Medication List    STOP taking these medications   acetaminophen 325 MG tablet Commonly known as:  TYLENOL   cephALEXin 500 MG capsule Commonly known as:  KEFLEX   diclofenac sodium 1 % Gel Commonly known as:  VOLTAREN   doxycycline 100 MG capsule Commonly known as:  VIBRAMYCIN   fluconazole 200 MG tablet Commonly known as:  DIFLUCAN   lidocaine 5 % Commonly known as:  LIDODERM   methylPREDNISolone 4 MG Tbpk tablet Commonly known as:  MEDROL DOSEPAK   metoprolol tartrate 25 MG tablet Commonly known as:  LOPRESSOR   NexIUM 40 MG capsule Generic drug:  esomeprazole     TAKE these medications   citalopram 20 MG tablet Commonly known as:  CELEXA Take 1 tablet (20 mg total) by mouth daily.   cloNIDine 0.3 MG tablet Commonly known as:  CATAPRES Take 0.3 mg by mouth daily.   diphenhydrAMINE 25 MG tablet Commonly known as:  BENADRYL Take 25 mg by mouth every 6 (six) hours as needed for itching.   furosemide 20 MG tablet Commonly known as:  LASIX Take 1 tablet (20 mg total) by mouth daily.   gabapentin 300 MG capsule Commonly known as:  NEURONTIN Take 3 capsules (900 mg total) by mouth 3 (three) times daily. What changed:   how much to take   guaiFENesin-dextromethorphan 100-10 MG/5ML syrup Commonly known as:  ROBITUSSIN DM Take 5 mLs by mouth every 6 (six) hours as needed for cough.   ipratropium-albuterol 0.5-2.5 (3) MG/3ML Soln Commonly known as:  DUONEB Take 3 mLs by nebulization every 6 (six) hours as needed for up to 30 days (shortness of breath).   labetalol 200 MG tablet Commonly known as:  NORMODYNE Take 1 tablet (200 mg total) by mouth 2 (two) times daily.   losartan 50 MG tablet Commonly known as:  COZAAR Take 50 mg by mouth daily.   methocarbamol 750 MG tablet Commonly known as:  ROBAXIN Take 1 tablet (750 mg total) by mouth 3 (three) times daily.   ondansetron 4 MG tablet Commonly known as:  ZOFRAN Take 1 tablet (4 mg total) by mouth every 6 (six) hours as needed for nausea or vomiting.   predniSONE 20 MG tablet Commonly known as:  Deltasone Take 1 tablet (20 mg total) by mouth daily for 3 days.   senna-docusate 8.6-50 MG tablet Commonly known as:  Senokot-S Take 1 tablet by mouth at bedtime.   traMADol 50 MG tablet Commonly known as:  ULTRAM Take 1 tablet (50 mg total) by mouth every 6 (six) hours as needed  for moderate pain.            Durable Medical Equipment  (From admission, onward)         Start     Ordered   11/02/18 0000  DME Nebulizer machine    Question:  Patient needs a nebulizer to treat with the following condition  Answer:  Bronchitis   11/02/18 0847         Follow-up Information    [facility] Follow [facility].   Specialty:  Home Health Services Why:  They will do your home health care at your home         Allergies  Allergen Reactions  . Vancomycin Other (See Comments)    Drug induced NEUTROPENIA  . Aspirin Other (See Comments)    Stomach bleeding  . Ibuprofen Other (See Comments)    Stomach bleeding  . Mushroom Extract Complex Hives and Itching  . Shellfish Allergy Hives and Itching  . Sulfa Antibiotics Hives and  Itching  . Sulfasalazine Itching and Hives  . Tylenol [Acetaminophen] Itching  . Betadine [Povidone Iodine] Itching  . Coconut Flavor Itching  . Codeine Itching  . Eggs Or Egg-Derived Products Nausea And Vomiting  . Iodine Itching  . Ivp Dye [Iodinated Diagnostic Agents] Hives    Takes Benadryl 50mg  PO before receiving iodinated contrast   . Metrizamide Hives    Takes Benadryl 50mg  PO before receiving iodinated contrast    Consultations:     Procedures/Studies: Dg Chest 2 View  Result Date: 10/26/2018 CLINICAL DATA:  Cough and shortness of breath for 3 days. EXAM: CHEST - 2 VIEW COMPARISON:  10/24/2018 FINDINGS: Mildly enlarged cardiac silhouette. Mediastinal contours appear intact. Increased interstitial markings with lower lobe predominance. Osseous structures are without acute abnormality. Soft tissues are grossly normal. IMPRESSION: 1. Mildly enlarged cardiac silhouette. 2. Increased interstitial markings with lower lobe predominance may represent asymmetric pulmonary edema or atypical pneumonia. Electronically Signed   By: Fidela Salisbury M.D.   On: 10/26/2018 11:17   Dg Chest Port 1 View  Result Date: 10/24/2018 CLINICAL DATA:  Fever, cough EXAM: PORTABLE CHEST 1 VIEW COMPARISON:  07/28/2018 FINDINGS: Peribronchial thickening. Heart is borderline in size. Bilateral infrahilar airspace opacities. No effusions or acute bony abnormality. IMPRESSION: Peribronchial thickening compatible with bronchitic changes. Mild infrahilar airspace opacities could reflect atelectasis or infiltrate/pneumonia. Electronically Signed   By: Rolm Baptise M.D.   On: 10/24/2018 20:06       Subjective: Patient is feeling better, dyspnea continue to improve, no nausea or vomiting, no chest pain. Feeling well to go home today.   Discharge Exam: Vitals:   11/01/18 2128 11/02/18 0605  BP:  (!) 156/76  Pulse:  84  Resp:  19  Temp:  98.2 F (36.8 C)  SpO2: 98% 100%   Vitals:   11/01/18 1416  11/01/18 2110 11/01/18 2128 11/02/18 0605  BP: (!) 159/73 (!) 163/87  (!) 156/76  Pulse: 70 82  84  Resp: 17 18  19   Temp: 98.3 F (36.8 C) 98 F (36.7 C)  98.2 F (36.8 C)  TempSrc: Oral Oral  Oral  SpO2: 99% 98% 98% 100%  Weight:      Height:        General: Not in pain or dyspnea Neurology: Awake and alert, non focal  E ENT: no pallor, no icterus, oral mucosa moist Cardiovascular: No JVD. S1-S2 present, rhythmic, no gallops, rubs, or murmurs. Trace lower extremity edema. Pulmonary: positive breath sounds bilaterally,  no significant wheezing, rhonchi or rales. Gastrointestinal. Abdomen protubertant no organomegaly, non tender, no rebound or guarding Skin. No rashes Musculoskeletal: no joint deformities   The results of significant diagnostics from this hospitalization (including imaging, microbiology, ancillary and laboratory) are listed below for reference.     Microbiology: Recent Results (from the past 240 hour(s))  Blood Culture (routine x 2)     Status: None   Collection Time: 10/24/18  9:30 PM  Result Value Ref Range Status   Specimen Description BLOOD RIGHT HAND  Final   Special Requests   Final    BOTTLES DRAWN AEROBIC AND ANAEROBIC Blood Culture results may not be optimal due to an inadequate volume of blood received in culture bottles   Culture   Final    NO GROWTH 5 DAYS Performed at [facility], [place] [address] [place], [place] [address]    Report Status 10/29/2018 FINAL  Final  Blood Culture (routine x 2)     Status: None   Collection Time: 10/24/18 10:00 PM  Result Value Ref Range Status   Specimen Description BLOOD LEFT ARM  Final   Special Requests   Final    BOTTLES DRAWN AEROBIC ONLY Blood Culture results may not be optimal due to an inadequate volume of blood received in culture bottles   Culture   Final    NO GROWTH 5 DAYS Performed at [facility], [place] [address] [place], [place] [address]    Report Status 10/29/2018 FINAL  Final   Respiratory Panel by PCR     Status: Abnormal   Collection Time: 10/27/18  8:56 AM  Result Value Ref Range Status   Adenovirus NOT DETECTED NOT DETECTED Final   Coronavirus 229E NOT DETECTED NOT DETECTED Final    Comment: (NOTE) The Coronavirus on the Respiratory Panel, DOES NOT test for the novel  Coronavirus (2019 nCoV)    Coronavirus HKU1 NOT DETECTED NOT DETECTED Final   Coronavirus NL63 NOT DETECTED NOT DETECTED Final   Coronavirus OC43 NOT DETECTED NOT DETECTED Final   Metapneumovirus DETECTED (A) NOT DETECTED Final   Rhinovirus / Enterovirus NOT DETECTED NOT DETECTED Final   Influenza A NOT DETECTED NOT DETECTED Final   Influenza B NOT DETECTED NOT DETECTED Final   Parainfluenza Virus 1 NOT DETECTED NOT DETECTED Final   Parainfluenza Virus 2 NOT DETECTED NOT DETECTED Final   Parainfluenza Virus 3 NOT DETECTED NOT DETECTED Final   Parainfluenza Virus 4 NOT DETECTED NOT DETECTED Final   Respiratory Syncytial Virus NOT DETECTED NOT DETECTED Final   Bordetella pertussis NOT DETECTED NOT DETECTED Final   Chlamydophila pneumoniae NOT DETECTED NOT DETECTED Final   Mycoplasma pneumoniae NOT DETECTED NOT DETECTED Final    Comment: Performed at [facility], [place] [address] [place], [place] [address]     Labs: BNP (last 3 results) No results for input(s): BNP in the last 8760 hours. Basic Metabolic Panel: Recent Labs  Lab 10/27/18 0454 10/28/18 0440 10/29/18 0540 10/30/18 0443  NA 139 140 141 138  K 3.4* 3.8 4.8 4.3  CL 103 102 105 102  CO2 23 28 25 24   GLUCOSE 124* 110* 101* 95  BUN 6* 11 14 14   CREATININE 1.06* 1.02* 1.04* 1.06*  CALCIUM 8.7* 8.9 9.1 9.4   Liver Function Tests: No results for input(s): AST, ALT, ALKPHOS, BILITOT, PROT, ALBUMIN in the last 168 hours. No results for input(s): LIPASE, AMYLASE in the last 168 hours. No results for input(s):  AMMONIA in the last 168 hours. CBC: Recent Labs  Lab 10/27/18 0454 10/28/18 0440  WBC 3.3* 3.9*  HGB  13.3 13.8  HCT 42.5 43.1  MCV 83.8 83.4  PLT 107* 120*   Cardiac Enzymes: No results for input(s): CKTOTAL, CKMB, CKMBINDEX, TROPONINI in the last 168 hours. BNP: Invalid input(s): POCBNP CBG: No results for input(s): GLUCAP in the last 168 hours. D-Dimer No results for input(s): DDIMER in the last 72 hours. Hgb A1c No results for input(s): HGBA1C in the last 72 hours. Lipid Profile No results for input(s): CHOL, HDL, LDLCALC, TRIG, CHOLHDL, LDLDIRECT in the last 72 hours. Thyroid function studies No results for input(s): TSH, T4TOTAL, T3FREE, THYROIDAB in the last 72 hours.  Invalid input(s): FREET3 Anemia work up No results for input(s): VITAMINB12, FOLATE, FERRITIN, TIBC, IRON, RETICCTPCT in the last 72 hours. Urinalysis    Component Value Date/Time   COLORURINE AMBER (A) 10/25/2018 1017   APPEARANCEUR HAZY (A) 10/25/2018 1017   LABSPEC 1.027 10/25/2018 1017   PHURINE 5.0 10/25/2018 1017   GLUCOSEU NEGATIVE 10/25/2018 1017   HGBUR NEGATIVE 10/25/2018 1017   BILIRUBINUR NEGATIVE 10/25/2018 1017   KETONESUR NEGATIVE 10/25/2018 1017   PROTEINUR 30 (A) 10/25/2018 1017   UROBILINOGEN 0.2 03/18/2015 1001   NITRITE NEGATIVE 10/25/2018 1017   LEUKOCYTESUR MODERATE (A) 10/25/2018 1017   Sepsis Labs Invalid input(s): PROCALCITONIN,  WBC,  LACTICIDVEN Microbiology Recent Results (from the past 240 hour(s))  Blood Culture (routine x 2)     Status: None   Collection Time: 10/24/18  9:30 PM  Result Value Ref Range Status   Specimen Description BLOOD RIGHT HAND  Final   Special Requests   Final    BOTTLES DRAWN AEROBIC AND ANAEROBIC Blood Culture results may not be optimal due to an inadequate volume of blood received in culture bottles   Culture   Final    NO GROWTH 5 DAYS Performed at [facility], [place] [address], [place], [place] [address]    Report Status 10/29/2018 FINAL  Final  Blood Culture (routine x 2)     Status: None   Collection Time: 10/24/18 10:00 PM   Result Value Ref Range Status   Specimen Description BLOOD LEFT ARM  Final   Special Requests   Final    BOTTLES DRAWN AEROBIC ONLY Blood Culture results may not be optimal due to an inadequate volume of blood received in culture bottles   Culture   Final    NO GROWTH 5 DAYS Performed at [facility], [place] [address] [place], [place] [address]    Report Status 10/29/2018 FINAL  Final  Respiratory Panel by PCR     Status: Abnormal   Collection Time: 10/27/18  8:56 AM  Result Value Ref Range Status   Adenovirus NOT DETECTED NOT DETECTED Final   Coronavirus 229E NOT DETECTED NOT DETECTED Final    Comment: (NOTE) The Coronavirus on the Respiratory Panel, DOES NOT test for the novel  Coronavirus (2019 nCoV)    Coronavirus HKU1 NOT DETECTED NOT DETECTED Final   Coronavirus NL63 NOT DETECTED NOT DETECTED Final   Coronavirus OC43 NOT DETECTED NOT DETECTED Final   Metapneumovirus DETECTED (A) NOT DETECTED Final   Rhinovirus / Enterovirus NOT DETECTED NOT DETECTED Final   Influenza A NOT DETECTED NOT DETECTED Final   Influenza B NOT DETECTED NOT DETECTED Final   Parainfluenza Virus 1 NOT DETECTED NOT DETECTED Final   Parainfluenza Virus 2 NOT DETECTED NOT DETECTED Final  Parainfluenza Virus 3 NOT DETECTED NOT DETECTED Final   Parainfluenza Virus 4 NOT DETECTED NOT DETECTED Final   Respiratory Syncytial Virus NOT DETECTED NOT DETECTED Final   Bordetella pertussis NOT DETECTED NOT DETECTED Final   Chlamydophila pneumoniae NOT DETECTED NOT DETECTED Final   Mycoplasma pneumoniae NOT DETECTED NOT DETECTED Final    Comment: Performed at [facility], [place] [address] [place], [place] [address]     Time coordinating discharge: 45 minutes  SIGNED:   Tawni Millers, MD  [facility] 11/02/2018, 8:27 AM

## 2018-11-05 DIAGNOSIS — I5033 Acute on chronic diastolic (congestive) heart failure: Secondary | ICD-10-CM | POA: Diagnosis not present

## 2018-11-05 DIAGNOSIS — I130 Hypertensive heart and chronic kidney disease with heart failure and stage 1 through stage 4 chronic kidney disease, or unspecified chronic kidney disease: Secondary | ICD-10-CM | POA: Diagnosis not present

## 2018-11-05 DIAGNOSIS — N182 Chronic kidney disease, stage 2 (mild): Secondary | ICD-10-CM | POA: Diagnosis not present

## 2018-11-05 DIAGNOSIS — Z8701 Personal history of pneumonia (recurrent): Secondary | ICD-10-CM | POA: Diagnosis not present

## 2018-11-06 DIAGNOSIS — I5033 Acute on chronic diastolic (congestive) heart failure: Secondary | ICD-10-CM | POA: Diagnosis not present

## 2018-11-06 DIAGNOSIS — N182 Chronic kidney disease, stage 2 (mild): Secondary | ICD-10-CM | POA: Diagnosis not present

## 2018-11-06 DIAGNOSIS — Z8701 Personal history of pneumonia (recurrent): Secondary | ICD-10-CM | POA: Diagnosis not present

## 2018-11-06 DIAGNOSIS — I130 Hypertensive heart and chronic kidney disease with heart failure and stage 1 through stage 4 chronic kidney disease, or unspecified chronic kidney disease: Secondary | ICD-10-CM | POA: Diagnosis not present

## 2018-11-12 DIAGNOSIS — R2681 Unsteadiness on feet: Secondary | ICD-10-CM | POA: Diagnosis not present

## 2018-11-12 DIAGNOSIS — J129 Viral pneumonia, unspecified: Secondary | ICD-10-CM | POA: Diagnosis not present

## 2018-11-12 DIAGNOSIS — B373 Candidiasis of vulva and vagina: Secondary | ICD-10-CM | POA: Diagnosis not present

## 2018-11-12 DIAGNOSIS — Z6841 Body Mass Index (BMI) 40.0 and over, adult: Secondary | ICD-10-CM | POA: Diagnosis not present

## 2018-11-12 DIAGNOSIS — M545 Low back pain: Secondary | ICD-10-CM | POA: Diagnosis not present

## 2018-11-12 DIAGNOSIS — F418 Other specified anxiety disorders: Secondary | ICD-10-CM | POA: Diagnosis not present

## 2018-11-12 DIAGNOSIS — E7849 Other hyperlipidemia: Secondary | ICD-10-CM | POA: Diagnosis not present

## 2018-11-12 DIAGNOSIS — I10 Essential (primary) hypertension: Secondary | ICD-10-CM | POA: Diagnosis not present

## 2018-11-13 DIAGNOSIS — I5033 Acute on chronic diastolic (congestive) heart failure: Secondary | ICD-10-CM | POA: Diagnosis not present

## 2018-11-13 DIAGNOSIS — Z8701 Personal history of pneumonia (recurrent): Secondary | ICD-10-CM | POA: Diagnosis not present

## 2018-11-13 DIAGNOSIS — I130 Hypertensive heart and chronic kidney disease with heart failure and stage 1 through stage 4 chronic kidney disease, or unspecified chronic kidney disease: Secondary | ICD-10-CM | POA: Diagnosis not present

## 2018-11-13 DIAGNOSIS — N182 Chronic kidney disease, stage 2 (mild): Secondary | ICD-10-CM | POA: Diagnosis not present

## 2018-11-16 DIAGNOSIS — N182 Chronic kidney disease, stage 2 (mild): Secondary | ICD-10-CM | POA: Diagnosis not present

## 2018-11-16 DIAGNOSIS — I130 Hypertensive heart and chronic kidney disease with heart failure and stage 1 through stage 4 chronic kidney disease, or unspecified chronic kidney disease: Secondary | ICD-10-CM | POA: Diagnosis not present

## 2018-11-16 DIAGNOSIS — Z8701 Personal history of pneumonia (recurrent): Secondary | ICD-10-CM | POA: Diagnosis not present

## 2018-11-16 DIAGNOSIS — I5033 Acute on chronic diastolic (congestive) heart failure: Secondary | ICD-10-CM | POA: Diagnosis not present

## 2018-11-19 DIAGNOSIS — I5033 Acute on chronic diastolic (congestive) heart failure: Secondary | ICD-10-CM | POA: Diagnosis not present

## 2018-11-19 DIAGNOSIS — Z8701 Personal history of pneumonia (recurrent): Secondary | ICD-10-CM | POA: Diagnosis not present

## 2018-11-19 DIAGNOSIS — N182 Chronic kidney disease, stage 2 (mild): Secondary | ICD-10-CM | POA: Diagnosis not present

## 2018-11-19 DIAGNOSIS — I130 Hypertensive heart and chronic kidney disease with heart failure and stage 1 through stage 4 chronic kidney disease, or unspecified chronic kidney disease: Secondary | ICD-10-CM | POA: Diagnosis not present

## 2018-11-20 DIAGNOSIS — Z8701 Personal history of pneumonia (recurrent): Secondary | ICD-10-CM | POA: Diagnosis not present

## 2018-11-20 DIAGNOSIS — I5033 Acute on chronic diastolic (congestive) heart failure: Secondary | ICD-10-CM | POA: Diagnosis not present

## 2018-11-20 DIAGNOSIS — N182 Chronic kidney disease, stage 2 (mild): Secondary | ICD-10-CM | POA: Diagnosis not present

## 2018-11-20 DIAGNOSIS — I130 Hypertensive heart and chronic kidney disease with heart failure and stage 1 through stage 4 chronic kidney disease, or unspecified chronic kidney disease: Secondary | ICD-10-CM | POA: Diagnosis not present

## 2018-11-23 DIAGNOSIS — I5033 Acute on chronic diastolic (congestive) heart failure: Secondary | ICD-10-CM | POA: Diagnosis not present

## 2018-11-23 DIAGNOSIS — I130 Hypertensive heart and chronic kidney disease with heart failure and stage 1 through stage 4 chronic kidney disease, or unspecified chronic kidney disease: Secondary | ICD-10-CM | POA: Diagnosis not present

## 2018-11-23 DIAGNOSIS — N182 Chronic kidney disease, stage 2 (mild): Secondary | ICD-10-CM | POA: Diagnosis not present

## 2018-11-23 DIAGNOSIS — Z8701 Personal history of pneumonia (recurrent): Secondary | ICD-10-CM | POA: Diagnosis not present

## 2018-11-25 DIAGNOSIS — Z8701 Personal history of pneumonia (recurrent): Secondary | ICD-10-CM | POA: Diagnosis not present

## 2018-11-25 DIAGNOSIS — I5033 Acute on chronic diastolic (congestive) heart failure: Secondary | ICD-10-CM | POA: Diagnosis not present

## 2018-11-25 DIAGNOSIS — N182 Chronic kidney disease, stage 2 (mild): Secondary | ICD-10-CM | POA: Diagnosis not present

## 2018-11-25 DIAGNOSIS — I130 Hypertensive heart and chronic kidney disease with heart failure and stage 1 through stage 4 chronic kidney disease, or unspecified chronic kidney disease: Secondary | ICD-10-CM | POA: Diagnosis not present

## 2018-12-05 DIAGNOSIS — N182 Chronic kidney disease, stage 2 (mild): Secondary | ICD-10-CM | POA: Diagnosis not present

## 2018-12-05 DIAGNOSIS — Z8701 Personal history of pneumonia (recurrent): Secondary | ICD-10-CM | POA: Diagnosis not present

## 2018-12-05 DIAGNOSIS — I5033 Acute on chronic diastolic (congestive) heart failure: Secondary | ICD-10-CM | POA: Diagnosis not present

## 2018-12-05 DIAGNOSIS — I130 Hypertensive heart and chronic kidney disease with heart failure and stage 1 through stage 4 chronic kidney disease, or unspecified chronic kidney disease: Secondary | ICD-10-CM | POA: Diagnosis not present

## 2018-12-08 DIAGNOSIS — N182 Chronic kidney disease, stage 2 (mild): Secondary | ICD-10-CM | POA: Diagnosis not present

## 2018-12-08 DIAGNOSIS — I130 Hypertensive heart and chronic kidney disease with heart failure and stage 1 through stage 4 chronic kidney disease, or unspecified chronic kidney disease: Secondary | ICD-10-CM | POA: Diagnosis not present

## 2018-12-08 DIAGNOSIS — Z8701 Personal history of pneumonia (recurrent): Secondary | ICD-10-CM | POA: Diagnosis not present

## 2018-12-08 DIAGNOSIS — I5033 Acute on chronic diastolic (congestive) heart failure: Secondary | ICD-10-CM | POA: Diagnosis not present

## 2018-12-15 DIAGNOSIS — M62838 Other muscle spasm: Secondary | ICD-10-CM | POA: Diagnosis not present

## 2018-12-15 DIAGNOSIS — M545 Low back pain: Secondary | ICD-10-CM | POA: Diagnosis not present

## 2018-12-15 DIAGNOSIS — I10 Essential (primary) hypertension: Secondary | ICD-10-CM | POA: Diagnosis not present

## 2018-12-15 DIAGNOSIS — R5383 Other fatigue: Secondary | ICD-10-CM | POA: Diagnosis not present

## 2018-12-16 DIAGNOSIS — I10 Essential (primary) hypertension: Secondary | ICD-10-CM | POA: Diagnosis not present

## 2018-12-16 DIAGNOSIS — N182 Chronic kidney disease, stage 2 (mild): Secondary | ICD-10-CM | POA: Diagnosis not present

## 2018-12-16 DIAGNOSIS — I130 Hypertensive heart and chronic kidney disease with heart failure and stage 1 through stage 4 chronic kidney disease, or unspecified chronic kidney disease: Secondary | ICD-10-CM | POA: Diagnosis not present

## 2018-12-16 DIAGNOSIS — Z8701 Personal history of pneumonia (recurrent): Secondary | ICD-10-CM | POA: Diagnosis not present

## 2018-12-16 DIAGNOSIS — I5033 Acute on chronic diastolic (congestive) heart failure: Secondary | ICD-10-CM | POA: Diagnosis not present

## 2018-12-23 DIAGNOSIS — N182 Chronic kidney disease, stage 2 (mild): Secondary | ICD-10-CM | POA: Diagnosis not present

## 2018-12-23 DIAGNOSIS — I5033 Acute on chronic diastolic (congestive) heart failure: Secondary | ICD-10-CM | POA: Diagnosis not present

## 2018-12-23 DIAGNOSIS — Z8701 Personal history of pneumonia (recurrent): Secondary | ICD-10-CM | POA: Diagnosis not present

## 2018-12-23 DIAGNOSIS — I130 Hypertensive heart and chronic kidney disease with heart failure and stage 1 through stage 4 chronic kidney disease, or unspecified chronic kidney disease: Secondary | ICD-10-CM | POA: Diagnosis not present

## 2019-03-15 DIAGNOSIS — R5383 Other fatigue: Secondary | ICD-10-CM | POA: Diagnosis not present

## 2019-03-15 DIAGNOSIS — E559 Vitamin D deficiency, unspecified: Secondary | ICD-10-CM | POA: Diagnosis not present

## 2019-03-15 DIAGNOSIS — N3946 Mixed incontinence: Secondary | ICD-10-CM | POA: Diagnosis not present

## 2019-03-15 DIAGNOSIS — G062 Extradural and subdural abscess, unspecified: Secondary | ICD-10-CM | POA: Diagnosis not present

## 2019-03-15 DIAGNOSIS — G8918 Other acute postprocedural pain: Secondary | ICD-10-CM | POA: Diagnosis not present

## 2019-03-15 DIAGNOSIS — R253 Fasciculation: Secondary | ICD-10-CM | POA: Diagnosis not present

## 2019-07-28 ENCOUNTER — Ambulatory Visit
Admit: 2019-07-28 | Discharge: 2019-07-28 | Payer: MEDICARE | Attending: Student in an Organized Health Care Education/Training Program | Primary: Internal Medicine

## 2019-07-28 DIAGNOSIS — Z0000 Encounter for general adult medical examination without abnormal findings: Secondary | ICD-10-CM

## 2019-07-28 MED ORDER — MUPIROCIN CALCIUM 2 % EX CREA
2 % | CUTANEOUS | 1 refills | Status: DC
Start: 2019-07-28 — End: 2019-08-04

## 2019-07-28 NOTE — Progress Notes (Signed)
Outpatient Clinic New Patient Note    Patient: Gabrielle Wells  DOB: 05-03-1951 (68 y.o.)  Date: 07/28/2019    CC: surgical wound and establish care     HPI:      Pt is a 68 y/o F with PMH of lumbar radiculopathy and fracture s/p fusion complicated by surgical hardware infection and long term antibiotic use (2018-2019), CHF, hypertension, incarcerated hernia repair with mesh x4 (2010), s/p hysterectomy who presents today to establish care and to have her back incision wound checked.     Pt recently moved back to [place] from [place] because her health has been deteriorating secondary to her fall and multiple spinal surgeries. She lives with her brother who is a patient at [facility]. She does not bring her records or her medication bottles.     Back wound   Pt reports she has hx of lumbar radiculopathy and had a fall in 2018 and has had multiple fusions and surgeries to her back. She had subsequent infection of the hardware and required removal and prolonged antibiotic use. She presents today with a wound she noticed on her surgical incision and is afraid of a back/spine infection again. She has not noticed any obvious pus or discharge on her clothes but notes pain.     HTN  Pt notes her BP at home have been 200s/100s. Good BP for her are 140/90 and occurs only infrequently. She is medication complaint. She notes frothy dark urine at times. Denies headaches, vision changes.     CHF  Pt reports a hx of CHF, unsure of EF. Reports previous echo was years ago. On lasix 80 mg daily and metoprolol.   - Reports increased dyspnea on exertion. Her baseline function is able to walk a few minutes before she feels fatigued. This has increased since her back surgery. Notes orthopnea abut denies PND. Her BP have been uncontrolled at home she notes in 200/100s at times. She notes leg swelling when she misses her lasix pills.     Health maintainence  Mammogram - reports cystic breasts, need mammogram   colonoscopy - 2  yrs ago, had polyps, requires every 5 yres   Reports updated vaccines, will request records      Past Medical History:    Past Medical History:   Diagnosis Date   ??? CHF (congestive heart failure) ([place])    ??? HTN (hypertension)    ??? Lumbar radiculopathy    ??? Neuropathic pain     back 2/2 to spinal surgeries   ??? Spinal fracture of T8 vertebra (HCC)     s/p fusion    ??? Surgical site infection     back, spinal hardware        Past Surgical History:  No past surgical history on file.    Home Meds:  Prior to Visit Medications    Medication Sig Taking? Authorizing Provider   cloNIDine (CATAPRES) 0.2 MG tablet Take 0.2 mg by mouth 2 times daily Yes Historical Provider, MD   esomeprazole Magnesium (NEXIUM) 20 MG PACK Take 20 mg by mouth daily Yes Historical Provider, MD   LOSARTAN POTASSIUM PO Take by mouth Yes Historical Provider, MD   gabapentin (NEURONTIN) 300 MG capsule Take 300 mg by mouth 2 times daily. Yes Historical Provider, MD   METOPROLOL SUCCINATE PO Take by mouth Yes Historical Provider, MD   Cholecalciferol (VITAMIN D3) 125 MCG (5000 UT) TABS Take by mouth Yes Historical Provider, MD   citalopram (CELEXA) 40  MG tablet Take 40 mg by mouth daily Yes Historical Provider, MD   furosemide (LASIX) 80 MG tablet Take 80 mg by mouth daily Yes Historical Provider, MD   potassium chloride (KLOR-CON M) 20 MEQ TBCR extended release tablet Take 40 mEq by mouth 2 times daily Yes Historical Provider, MD   mupirocin (BACTROBAN) 2 % cream Apply 3 times daily. Yes Minna Antis, MD       Allergies:    Betadine [povidone iodine]; Coconut oil; Codeine; Fish-derived products; Ibuprofen; Iodine; Other; and Sulfa antibiotics    Family History:   No family history on file.    Review of Systems   Constitutional: Positive for fatigue. Negative for diaphoresis and fever.   Respiratory: Positive for shortness of breath (chronic ). Negative for cough.    Cardiovascular: Negative for chest pain, palpitations and leg swelling.   Gastrointestinal:  Negative for abdominal pain, blood in stool, constipation, diarrhea, nausea and vomiting.   Genitourinary: Negative for dysuria.   Musculoskeletal: Positive for back pain.   Skin: Positive for wound (on back ).     A 10-organ Review Of Systems was obtained and otherwise unremarkable except as per HPI.      There is no immunization history on file for this patient.    Health Maintenance Due   Topic Date Due   ??? Potassium monitoring  10-09-1950   ??? Creatinine monitoring  Oct 15, 1950   ??? Hepatitis C screen  [birth year]   ??? DTaP/Tdap/Td vaccine (1 - Tdap) 04/14/1968   ??? Lipid screen  04/14/1989   ??? Diabetes screen  04/14/1989   ??? Breast cancer screen  04/15/1999   ??? Shingles Vaccine (1 of 2) 04/15/1999   ??? Colon cancer screen colonoscopy  04/15/1999   ??? DEXA (modify frequency per FRAX score)  04/14/2004   ??? Pneumococcal 65+ years Vaccine (1 of 1 - PPSV23) 04/14/2014   ??? Flu vaccine (1) 04/20/2019       Data: Old records have been reviewed electronically.    PHYSICAL EXAM:  BP 139/85 (Site: Right Lower Arm, Position: Sitting, Cuff Size: Large Adult)    Pulse 80    Temp 96.8 ??F (36 ??C) (Temporal)    Resp 20    Ht 5\' 3"  (1.6 m)    Wt 275 lb 12.8 oz (125.1 kg)    SpO2 99%    BMI 48.86 kg/m??   Physical Exam  Constitutional:       Appearance: Normal appearance. She is obese.   HENT:      Head: Normocephalic and atraumatic.   Cardiovascular:      Rate and Rhythm: Normal rate and regular rhythm.      Pulses: Normal pulses.      Heart sounds: Murmur present.   Pulmonary:      Effort: Pulmonary effort is normal. No respiratory distress.      Breath sounds: Normal breath sounds.   Abdominal:      General: Bowel sounds are normal.      Palpations: Abdomen is soft.      Tenderness: There is no abdominal tenderness.   Musculoskeletal:      Right lower leg: No edema.      Left lower leg: No edema.   Skin:     General: Skin is dry.      Comments: Small superficial scrape on back, tender to palpation, no obvious discharge    Neurological:       General: No focal deficit present.  Mental Status: She is alert and oriented to person, place, and time. Mental status is at baseline.      Sensory: Sensory deficit (around back (chronic)) present.   Psychiatric:         Mood and Affect: Mood normal.         Behavior: Behavior normal.         Thought Content: Thought content normal.         Judgment: Judgment normal.         Assessment & Plan:      1. Wound of back, unspecified laterality, initial encounter  Looks superficial, with no obvious pus. TTP around the area that patient reports is chronic.   - mupirocin ointment TID to area  - clean gauze and decrease pressure     2. Hypertension, unspecified type  Pt reports BP in 200/100s at home. Denies headaches, eyes. Reports dark frothy urine.   - cont current BP medications  - log blood pressures at home   - COMPREHENSIVE METABOLIC PANEL; Future  - URINALYSIS WITH MICROSCOPIC to check kidney function and proteinuria.   - TSH with Reflex; Future  - return in 1 week for follow-up and bring medication bottles. Will consider changing medication. Possibly switch to valsartan.     3. Congestive heart failure, unspecified HF chronicity, unspecified heart failure type ([place])  hx of CHF, unsure of EF. Reports previous echo was years ago. Reports increased dyspnea on exertion. Her BP have been uncontrolled at home.   - will obtain records and blood work   - COMPREHENSIVE METABOLIC PANEL; Future  - HEMOGLOBIN A1C; Future  - LIPID PANEL; Future    4. Healthcare maintenance  Pt here to establish care.   - will request records from her doctors in [place]  - follow-up in 1 week with records, blood work and medication bottles.   - COMPREHENSIVE METABOLIC PANEL; Future  - CBC WITH AUTO DIFFERENTIAL; Future  - HEMOGLOBIN A1C; Future  - URINALYSIS WITH MICROSCOPIC  - LIPID PANEL; Future  - MAM TOMO DIGITAL SCREEN BILATERAL; Future  - TSH with Reflex; Future      Return in about 1 week (around 08/04/2019) for blood pressure,  and heart failure .    Dispo: Pt has been staffed with Dr. Truddie Coco  _______________  Minna Antis, MD, 07/28/2019 4:17 PM   PGY-1    Addendum to Resident H& P/Progress note:  I have personally seen,examined and evaluated the patient. I have reviewed the current history, physical findings, labs and assessment and plan and agree with note as documented by resident MD ( Dr.Carlyn Mullenbach)      Britt Bottom, MD, Rosalita Chessman

## 2019-07-28 NOTE — Patient Instructions (Addendum)
Please have blood work done  Please log your blood pressures at home   At your next visit bring your medication bottles  Please call your doctors and have them send [name] your records     Follow-up with [name] in 1 week

## 2019-07-28 NOTE — Progress Notes (Signed)
Patient states she has lived in [place] for several yrs. Moved back to [place] this past month. Has been under care of surgeon Dr.David Jones/gives hx for 4 back surgeries with hardware complications,infections, removal of hardware. Asking if physician will check her back incision that was healed but lately feels irritated.  S/P 4 hernia surgeries and states abdomen hurts with walking.  CHF  Anxiety/forgot name of her medication for this problem.

## 2019-08-04 ENCOUNTER — Ambulatory Visit
Admit: 2019-08-04 | Discharge: 2019-08-04 | Payer: MEDICARE | Attending: Student in an Organized Health Care Education/Training Program | Primary: Internal Medicine

## 2019-08-04 ENCOUNTER — Encounter

## 2019-08-04 DIAGNOSIS — S31000D Unspecified open wound of lower back and pelvis without penetration into retroperitoneum, subsequent encounter: Secondary | ICD-10-CM

## 2019-08-04 MED ORDER — DICLOFENAC SODIUM 1 % EX GEL
1 % | Freq: Two times a day (BID) | CUTANEOUS | Status: DC
Start: 2019-08-04 — End: 2019-08-04

## 2019-08-04 MED ORDER — DICLOFENAC SODIUM 1 % EX CREA
1 | Freq: Two times a day (BID) | CUTANEOUS | 0 refills | Status: DC
Start: 2019-08-04 — End: 2019-10-11

## 2019-08-04 MED ORDER — MUPIROCIN CALCIUM 2 % EX CREA
2 % | CUTANEOUS | 0 refills | Status: AC
Start: 2019-08-04 — End: 2019-09-03

## 2019-08-04 MED ORDER — VALSARTAN 80 MG PO TABS
80 MG | ORAL_TABLET | Freq: Every day | ORAL | 1 refills | Status: DC
Start: 2019-08-04 — End: 2019-08-04

## 2019-08-04 MED ORDER — VALSARTAN 80 MG PO TABS
80 MG | ORAL_TABLET | Freq: Every day | ORAL | 1 refills | Status: DC
Start: 2019-08-04 — End: 2019-12-29

## 2019-08-04 NOTE — Patient Instructions (Addendum)
-   start valsartan 80 mg daily and stop losartan   - log your blood pressures at home and bring the log with you at your next visit   - Have your blood work done before your next visit   - Call your PCP in [place] and have them send your records  - bring medication bottles to next visit   - apply the Bactroban cream to your wound 3 times a day and avoid any abrasions to the area  - buy an over the counter lidocaine patch to apply to your back for pain, do not leave on longer than 12 hrs, use heating pad     - follow-up with [name] in 4 weeks

## 2019-08-04 NOTE — Progress Notes (Signed)
Outpatient Clinic Established Patient Note    Patient: Gabrielle Wells  DOB: 1950-11-23 (68 y.o.)  Date: 08/04/2019    CC: blood pressure and back pain     HPI:      68 y/o F with PMH of lumbar radiculopathy and fracture s/p fusion complicated by surgical hardware infection and removal w/ long term antibiotic use (2018-2019), CHF, hypertension, incarcerated hernia repair with mesh x4 (2010), s/p hysterectomy who presents today for follow-up for her blood pressure and back wound.     Back wound   Pt w/ irritated superficial back wound with no drainage or pus. She was prescribed mucipiron at previous visit but she did not get the medication or apply. She reports increased pain to the area including her back.     Lumbar radiculopathy s/p multiple back surgeries   She reports back pain and generalized weakness in her back and leg muscles since her surgeries. She has not had physical therapy before. She has tried some back exercises. She uses a walker at baseline.     ??  HTN  Pt notes her BP at home have been 170s-200s/90-100s at home. She uses a wrist blood pressure monitor. She did log them but did not bring the log with her today. Her BP in the clinic have been 139/85 previously and 146/81 this visit. She is medication complaint but did not bring her medication bottles with her. Denies headaches, vision changes, loss of sensation or weakness.    ??  CHF  Pt reports a hx of CHF, unsure of EF. Reports previous echo was years ago. On lasix 80 mg daily and metoprolol.   - She has noticed increased dyspnea on exertion for the past two months. Her baseline function is able to walk a few minutes before she feels fatigued. She attributes this to decompensation from her back surgeries. Denies worse orthopnea, no leg swelling, no PND.  ??    Home Meds:  Prior to Visit Medications    Medication Sig Taking? Authorizing Provider   mupirocin (BACTROBAN) 2 % cream Apply 3 times daily. Yes Minna Antis, MD   valsartan (DIOVAN) 80 MG  tablet Take 1 tablet by mouth daily Yes Hodaya Curto, MD   Diclofenac Sodium 1 % CREA Apply 1 Tube topically 2 times daily Yes Cloee Dunwoody, MD   cloNIDine (CATAPRES) 0.2 MG tablet Take 0.2 mg by mouth 2 times daily Yes Historical Provider, MD   esomeprazole Magnesium (NEXIUM) 20 MG PACK Take 20 mg by mouth daily Yes Historical Provider, MD   gabapentin (NEURONTIN) 300 MG capsule Take 300 mg by mouth 2 times daily. Yes Historical Provider, MD   METOPROLOL SUCCINATE PO Take by mouth Yes Historical Provider, MD   Cholecalciferol (VITAMIN D3) 125 MCG (5000 UT) TABS Take by mouth Yes Historical Provider, MD   citalopram (CELEXA) 40 MG tablet Take 40 mg by mouth daily Yes Historical Provider, MD   furosemide (LASIX) 80 MG tablet Take 80 mg by mouth daily Yes Historical Provider, MD   potassium chloride (KLOR-CON M) 20 MEQ TBCR extended release tablet Take 40 mEq by mouth 2 times daily Yes Historical Provider, MD       Allergies:    Betadine [povidone iodine], Coconut oil, Codeine, Fish-derived products, Ibuprofen, Iodine, Other, and Sulfa antibiotics    Health Maintenance Due   Topic Date Due   ??? Potassium monitoring  [birth year]   ??? Creatinine monitoring  Jun 01, 1951   ??? Hepatitis C screen  07-11-1951   ???  DTaP/Tdap/Td vaccine (1 - Tdap) 04/14/1968   ??? Lipid screen  04/14/1989   ??? Diabetes screen  04/14/1989   ??? Breast cancer screen  04/15/1999   ??? Shingles Vaccine (1 of 2) 04/15/1999   ??? Colon cancer screen colonoscopy  04/15/1999   ??? DEXA (modify frequency per FRAX score)  04/14/2004   ??? Pneumococcal 65+ years Vaccine (1 of 1 - PPSV23) 04/14/2014   ??? Flu vaccine (1) 04/20/2019         There is no immunization history on file for this patient.    Review of Systems   Constitutional: Positive for fatigue. Negative for fever.   Respiratory: Negative for cough and wheezing. Shortness of breath: mild     Cardiovascular: Negative for chest pain, palpitations and leg swelling.   Gastrointestinal: Negative for abdominal pain,  constipation, diarrhea, nausea and vomiting.   Genitourinary: Negative for dysuria.   Musculoskeletal: Positive for arthralgias and back pain.   Neurological: Negative for light-headedness, numbness and headaches.     A 10-organ Review Of Systems was obtained and otherwise unremarkable except as per HPI.    Data: Old records have been reviewed electronically.    PHYSICAL EXAM:  BP (!) 146/81 (Site: Left Upper Arm, Position: Sitting, Cuff Size: Large Adult)    Pulse 69    Temp 97 ??F (36.1 ??C) (Temporal)    Resp 16    Ht 5\' 3"  (1.6 m)    Wt 284 lb (128.8 kg)    SpO2 100%    BMI 50.31 kg/m??   Physical Exam  Constitutional:       Appearance: She is obese.   HENT:      Head: Normocephalic and atraumatic.   Cardiovascular:      Rate and Rhythm: Normal rate and regular rhythm.      Pulses: Normal pulses.      Heart sounds: Normal heart sounds. No murmur.   Pulmonary:      Effort: Pulmonary effort is normal. No respiratory distress.      Breath sounds: Normal breath sounds.   Abdominal:      General: There is no distension.      Palpations: Abdomen is soft.      Tenderness: There is no abdominal tenderness.   Skin:     Comments: superficial wound with erythema on back, no drainage or purulence    Neurological:      General: No focal deficit present.      Mental Status: She is alert and oriented to person, place, and time.         Assessment & Plan:      1. Wound of back, unspecified laterality, subsequent encounter  Looks superficial, with no obvious pus. TTP around the area that patient reports is chronic.   - mupirocin 2% ointment TID to area  - clean gauze and decrease pressure     2. Lumbar radiculopathy  Pt w/ weakness and some pain since her surgeries in 2019.  - continue tylenol and gabapentin for pain   - suggest OTC lidocaine patch to the area   - diclofenac gel apply BID  - Referral to [facility] for muscle strengthen    3. Heart failure, unspecified HF chronicity, unspecified heart failure  type ([place])  hx of CHF, unsure of EF. Reports previous echo was years ago. Reports increased dyspnea on exertion. Her BP have been uncontrolled at home.   No swelling in legs, no JVD, lungs w/ exp  wheezing   - CMP and TSH pending   - switched losartan to valsartan 80 mg daily   - follow-up in 4 weeks   - will obtain records, if no previous echo study available will consider ordering one     4. Essential hypertension   Pt reports BP in 200/100s at home. Denies headaches, eyes. Reports dark frothy urine  - CMP, UA, CBC, TSH, lipid panel pending   - switched losartan to valsartan 80 mg daily   - log BP at home and bring log     Return in about 4 weeks (around 09/01/2019) for blood pressure.    Dispo: Pt has been staffed with Dr. Truddie Coco  _______________  Minna Antis, MD, 08/04/2019 2:18 PM   PGY-1    Addendum to Resident H& P/Progress note:  I have personally seen,examined and evaluated the patient. I have reviewed the current history, physical findings, labs and assessment and plan and agree with note as documented by resident MD ( Dr.Tharon Bomar)      Britt Bottom, MD, Rosalita Chessman

## 2019-08-04 NOTE — Telephone Encounter (Signed)
Gabrielle Wells called and needs signed orders from the doctor to continue with care.

## 2019-08-05 LAB — COMPREHENSIVE METABOLIC PANEL
ALT: 11 U/L (ref 10–40)
AST: 18 U/L (ref 15–37)
Albumin/Globulin Ratio: 1.5 (ref 1.1–2.2)
Albumin: 4.4 g/dL (ref 3.4–5.0)
Alkaline Phosphatase: 84 U/L (ref 40–129)
Anion Gap: 11 (ref 3–16)
BUN: 18 mg/dL (ref 7–20)
CO2: 27 mmol/L (ref 21–32)
Calcium: 9.7 mg/dL (ref 8.3–10.6)
Chloride: 101 mmol/L (ref 99–110)
Creatinine: 1.2 mg/dL (ref 0.6–1.2)
GFR African American: 54 — AB (ref >60)
GFR Non-African American: 44 — AB (ref >60)
Globulin: 3 g/dL
Glucose: 97 mg/dL (ref 70–99)
Potassium: 5 mmol/L (ref 3.5–5.1)
Sodium: 139 mmol/L (ref 136–145)
Total Bilirubin: 0.5 mg/dL (ref 0.0–1.0)
Total Protein: 7.4 g/dL (ref 6.4–8.2)

## 2019-08-05 LAB — LIPID PANEL
Cholesterol, Total: 224 mg/dL — ABNORMAL HIGH (ref 0–199)
HDL: 62 mg/dL — ABNORMAL HIGH (ref 40–60)
LDL Calculated: 137 mg/dL — ABNORMAL HIGH (ref <100)
Triglycerides: 124 mg/dL (ref 0–150)
VLDL Cholesterol Calculated: 25 mg/dL

## 2019-08-05 LAB — TSH WITH REFLEX: TSH: 3.15 u[IU]/mL (ref 0.27–4.20)

## 2019-08-05 LAB — HEMOGLOBIN A1C
Hemoglobin A1C: 5.7 %
eAG: 116.9 mg/dL

## 2019-08-05 LAB — CBC WITH AUTO DIFFERENTIAL
Basophils %: 0.7 %
Basophils Absolute: 0 10*3/uL (ref 0.0–0.2)
Eosinophils %: 1.3 %
Eosinophils Absolute: 0 10*3/uL (ref 0.0–0.6)
Hematocrit: 44.1 % (ref 36.0–48.0)
Hemoglobin: 14 g/dL (ref 12.0–16.0)
Lymphocytes %: 44.1 %
Lymphocytes Absolute: 1.3 10*3/uL (ref 1.0–5.1)
MCH: 28 pg (ref 26.0–34.0)
MCHC: 31.6 g/dL (ref 31.0–36.0)
MCV: 88.5 fL (ref 80.0–100.0)
MPV: 10.1 fL (ref 5.0–10.5)
Monocytes %: 26.8 %
Monocytes Absolute: 0.8 10*3/uL (ref 0.0–1.3)
Neutrophils %: 27.1 %
Neutrophils Absolute: 0.8 10*3/uL — CL (ref 1.7–7.7)
Platelets: 138 10*3/uL (ref 135–450)
RBC: 4.99 M/uL (ref 4.00–5.20)
RDW: 14.6 % (ref 12.4–15.4)
WBC: 2.9 10*3/uL — ABNORMAL LOW (ref 4.0–11.0)

## 2019-08-05 LAB — BLOOD SMEAR REVIEW

## 2019-08-05 NOTE — Telephone Encounter (Signed)
[facility] called with critical lab Absolute nutrofils 0.8.

## 2019-08-12 ENCOUNTER — Encounter

## 2019-08-18 NOTE — Care Coordination-Inpatient (Signed)
Physical Therapy  Cancellation/No-show Note  Patient Name:  Gabrielle Wells  DOB:  August 13, 1951   Date:  08/18/2019  MRN: [id number]  Cancelled visits to date: 0  No-shows to date: 0    For today's appointment patient:  [x]   Cancelled  []   Rescheduled appointment  []   No-show     Reason given by patient:  [x]   Patient ill  []   Conflicting appointment  []   No transportation    []   Conflict with work  []   No reason given  []   Other:     Comments:  Called yesterday to confirm scheduled eval with pt ill and rescheduled for next week.    Electronically signed by:  Trula Ore, PT

## 2019-08-19 ENCOUNTER — Inpatient Hospital Stay: Payer: MEDICARE | Primary: Student in an Organized Health Care Education/Training Program

## 2019-08-25 ENCOUNTER — Inpatient Hospital Stay: Admit: 2019-08-25 | Payer: MEDICARE | Primary: Internal Medicine

## 2019-08-25 DIAGNOSIS — M5416 Radiculopathy, lumbar region: Secondary | ICD-10-CM

## 2019-08-25 NOTE — Plan of Care (Signed)
Outpatient Physical Therapy  Phone: [phone] Fax: [phone]    To: Referring Practitioner: Minna Antis  From: Trula Ore, PT   Date: 08/25/2019  Patient: Gabrielle Wells     DOB: 24-Jun-1951 MRN: [id number]  Diagnosis: Diagnosis: M54.16 Lumbar Radiculopathy   Treatment Diagnosis: Treatment Diagnosis: back pain with general weakness and falls     Physical Therapy Certification/Re-Certification Form  Dear Dr. Minna Antis  The following patient has been evaluated for physical therapy services and for therapy to continue, Medicare requires monthly physician review of the treatment plan. Please review the attached evaluation and/or summary of the patient's plan of care, and verify that you agree therapy should continue by signing the attached document and sending it back to our office.    Plan of Care/Treatment to date:  [x]  Therapeutic Exercise (Review/Progress HEP and provide verbal/tactile cueing for activities related to strengthening, flexibility,  endurance, ROM.)       [x]  Therapeutic Activity (Provide verbal/tactile cueing for dynamic activities to promote functional tasks.)          [x]  Gait Training (Provide verbal/tactile/visual cueing for facilitation of normalized gait pattern without or with the least restrictive AD to decrease pain and/or risk for falling.)          [x]  Neuromuscular Re-education (Review/Progress HEP and provide verbal/tactile cueing for activities related to improving balance, coordination, kinesthetic sense, posture, motor skill, proprioception.)         [x]  Manual Therapy (Provide manual therapy to mobilize soft tissue/joints for the purpose of modulating pain, promoting relaxation, increasing ROM, reducing/eliminating soft tissue swelling/inflammation/tightness, improving soft tissue extensibility)   []  Dry Needling    []  Aquatic Therapy (Facilitate muscle relaxation and increases peripheral circulation; stimulates body awareness, balance, and trunk stability.)                   [x]  Modalities (For modulating pain/tenderness/paresthesias, reducing swelling/inflammation/tightness, improving soft tissue extensibility, and/or to increase muscle tone/strength):     []  Ultrasound  []  Electrical Stimulation        []  Cervical Traction []  Lumbar Traction       []  Cold/hotpack []  Iontophoresis   Other:      []           []       Assessment:  Conditions Requiring Skilled Therapeutic Intervention  Body structures, Functions, Activity limitations: Decreased functional mobility , Decreased ADL status, Decreased ROM, Decreased strength, Decreased safe awareness, Decreased posture, Decreased balance, Increased pain  Assessment: 69 yo medically complex patient presents with limitations in ROM,strength,unsteady gait and chronic pain. She has recently been moved to family members home due to decline in functional independence and safety with ongoing issues with falls. Pt demonstrated difficulty during eval due to hx of IBS with multiple trips to [facility] made with ongoing assessment needed for functional testing at later date due to time restraints. This pt will benefit from ongoing PT to address current limitations as well as falls prevention as she is high risk for falling. Pt presents with significant pain and weakness with plan to try PT on an outpatient basis ,but if not tolerated, then will get orders for home PT. Pt is motivated to participate in therapy and demonstrates fair rehab potential due to multiple comorbities and chronic pain issues.  Treatment Diagnosis: back pain with general weakness and falls  Prognosis: Fair  Decision Making: High Complexity  REQUIRES PT FOLLOW UP: Yes    Goals:  Short term goals  Time Frame for Short term  goals: 4 weeks  Short term goal 1: Improve B knee flexion to 120 degrees to enable I transfers from all surfaces  Short term goal 2: Increase B hip strength to 3/5 for I functional mobility  Short term goal 3: Demonstrate I functional transfers all surfaces  Short term  goal 4: demonstrate I HEP  Long term goals  Time Frame for Long term goals : 8 weeks  Long term goal 1: Improve B LE strength to 4/5 for improved functional mobility  Long term goal 2: I ambulation with RW >/=1000' for improved community distances demonstrating good gt components and upright posture  Long term goal 3: Pt will negotiate 1 flight of steps in the home to access meals and socialization  Long term goal 4: Pt will verbalize/demonstrate I knowledge of home safety awareness and falls risk prevention with decreased falls by 100%  Long term goal 5: Improve Modified Oswestry Score from 37/50 to 30/50 for improved pain with daily activity    Frequency/Duration:  # Days per week: []  1 day # Weeks: []  1 week []  5 weeks     [x]  2 days   []  2 weeks []  6 weeks     []  3 days   []  3 weeks []  7 weeks     []  4 days   []  4 weeks [x]  8 weeks    Rehab Potential: []  Excellent []  Good [x]  Fair  []  Poor       Electronically signed by:  Trula Ore, PT        If you have any questions or concerns, please don't hesitate to call.  Thank you for your referral.      Physician Signature:_Hina Virk_____________________Date:__1/12/21___________  By signing above, therapist???s plan is approved by physician

## 2019-08-25 NOTE — Other (Signed)
Physical Therapy Daily Treatment Note  Date:  08/25/2019    Patient Name:  Gabrielle Wells    DOB:  1950-09-07  MRN: [id number]  Restrictions/Precautions:    Restrictions/Precautions  Restrictions/Precautions: (as tolerated)   Medical/Treatment Diagnosis Information:  ?? Diagnosis: M54.16 Lumbar Radiculopathy  ?? Treatment Diagnosis: back pain with general weakness and falls  Insurance/Certification information:  PT Insurance Information: Medicare  Insurance Allowable Visits:    Physician Information:  Referring Practitioner: Minna Antis  MD Follow-up Visit:   Plan of care signed (Y/N):    Visit# / total visits: 1 /16  Pain level: 6/10     Progress Note Due (10 visits or 30 days, whichever is less):    Recertification Note Due (End of POC or 90 days, whichever is less):      Subjective:    Subjective  Subjective: Pt referred to PT clinic for evaluation of back pain with N&T in L lateral thigh. 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 She reports having a fall in Sept while living in her home in [place] and sustained a T8 Fx and L shoulder Fx. She was relocated here to [facility] around Thanksgiving to live with brother due to decline in functional independence and safety with caring for self in her home. Pt currently lives in the basement of the home of her brother and SIL, and also has help from her sister who is local. Pt c/o low back pain 6/10 when up out of bed and reports lying in bed majority of her time to control pain. She also has N&T over her L lateral thigh. She reports no pain when lying down and pain worsens with standing,walking or any activity.Pt reports using a RW at baseline and has required assist with ADLs for some time due to poor UE mobility following "failed" R TSR and recent humeral Fx with poor residual shoulder strength and motion. She reports having severe  difficulty negotiating 1 flight of steps to access main floor of brothers home and meals are typically brought down to her daily with pt going upstairs "only when she absolutely has to".  Pts goal for therapy is to gain strength and functional independence as she reports never having therapy following any of the back surgeries. She also reported falling in the home yesterday when walking without her walker and " lost her balance"  Pain Screening  Patient Currently in Pain: Yes  Pain Assessment  Pain Assessment: 0-10  Pain Level: 6  Patient's Stated Pain Goal: No pain  Pain Type: Chronic pain  Pain Location: Back, Leg  Pain Orientation: Left  Pain Descriptors: Aching, Tingling, Numbness  Pain Frequency: Intermittent     Objective:  ??? Observation:   ??? Test measurements:      Exercises, Neuro Facilitation & Gait Training ([id number], [id number], [id number]):  Activity Resistance/Repetitions Other comments                                                                           Therapeutic Activities [phone]):  Adjusted walker to proper height with training in upright posture control. Instructed in use of AD at  all times to prevent falls as well as basic falls prevention in the home    Home Exercise Program:       Manual Treatments [phone]):      Modalities:      Timed Code Treatment Minutes:  eval x 25min, gt x 15 min, TE x 16 min    Total Treatment Minutes:  52 min    Treatment/Activity Tolerance:  []  Patient tolerated treatment well []  Patient limited by fatigue  [x]  Patient limited by pain  [x]  Patient limited by other medical complications  []  Other:     Assessment: 69 yo medically complex patient presents with limitations in ROM,strength,unsteady gait and chronic pain. She has recently been moved to family members home due to decline in functional independence and safety with ongoing issues with falls. Pt demonstrated difficulty during eval due to hx of IBS with multiple trips to [facility] made with ongoing assessment needed for  functional testing at later date due to time restraints. This pt will benefit from ongoing PT to address current limitations as well as falls prevention as she is high risk for falling. Pt presents with significant pain and weakness with plan to try PT on an outpatient basis ,but if not tolerated, then will get orders for home PT. Pt is motivated to participate in therapy and demonstrates fair rehab potential due to multiple comorbities and chronic pain issues.    Prognosis: []  Good [x]  Fair  []  Poor    Patient Requires Follow-up: [x]  Yes  []  No    Goals:  Short term goals  Time Frame for Short term goals: 4 weeks  Short term goal 1: Improve B knee flexion to 120 degrees to enable I transfers from all surfaces  Short term goal 2: Increase B hip strength to 3/5 for I functional mobility  Short term goal 3: Demonstrate I functional transfers all surfaces  Short term goal 4: demonstrate I HEP  Long term goals  Time Frame for Long term goals : 8 weeks  Long term goal 1: Improve B LE strength to 4/5 for improved functional mobility  Long term goal 2: I ambulation with RW >/=1000' for improved community distances demonstrating good gt components and upright posture  Long term goal 3: Pt will negotiate 1 flight of steps in the home to access meals and socialization  Long term goal 4: Pt will verbalize/demonstrate I knowledge of home safety awareness and falls risk prevention with decreased falls by 100%  Long term goal 5: Improve Modified Oswestry Score from 37/50 to 30/50 for improved pain with daily activity    Plan:   Visits per week:    # of weeks:      []  Continue per plan of care []  Alter current plan (see comments)  [x]  Plan of care initiated []  Hold pending MD visit []  Discharge    Plan for Next Session:  progress as tolerated    Electronically signed by:  Lowella Curb

## 2019-08-25 NOTE — Progress Notes (Signed)
Physical Therapy  Initial Assessment  Date: 08/25/2019  Patient Name: Gabrielle Wells  MRN: [id number]  DOB: Apr 23, 1951     Treatment Diagnosis: back pain with general weakness and falls    Restrictions  Restrictions/Precautions  Restrictions/Precautions: (as tolerated)    Subjective   General  Chart Reviewed: Yes  Additional Pertinent Hx: PMHx: HTN, CHF,hernia repair w/mesh, SBO x 2, IBS, cholecystectomy, R femur Fx, R TSR, B TKR, s/p fusion/back surgeries x 4 s/p infection with hardware removal and long term antibiotic use 2018-2019, falls, recent fall in Sept s/p T8 Fx and L shoulder Fx.  Referring Practitioner: Minna Antis  Referral Date : 08/04/19  Diagnosis: M54.16 Lumbar Radiculopathy  Follows Commands: Within Functional Limits  General Comment  Comments: Prior level of function included pt with chronic pain issues following multiple back surgeries with complications, used RW for functional mobility, had assist with ADLs(bathing and dressing) daily due to poor shoulder function B, and ongoing issues with weakness and falls  PT Visit Information  PT Insurance Information: Medicare  Progress Note Due Date: 09/22/19  Subjective  Subjective: Pt referred to PT clinic for evaluation of back pain with N&T in L lateral thigh. 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 She reports having a fall in Sept while living in her home in [place] and sustained a T8 Fx and L shoulder Fx. She was relocated here to [facility] around Thanksgiving to live with brother due to decline in functional independence and safety with caring for self in her home. Pt currently lives in the basement of the home of her brother and SIL, and also has help from her sister who is local. Pt c/o low back pain 6/10 when up out of bed and reports lying in bed majority of her time to control pain. She also has N&T over her L  lateral thigh. She reports no pain when lying down and pain worsens with standing,walking or any activity.Pt reports using a RW at baseline and has required assist with ADLs for some time due to poor UE mobility following "failed" R TSR and recent humeral Fx with poor residual shoulder strength and motion. She reports having severe difficulty negotiating 1 flight of steps to access main floor of brothers home and meals are typically brought down to her daily with pt going upstairs "only when she absolutely has to".  Pts goal for therapy is to gain strength and functional independence as she reports never having therapy following any of the back surgeries. She also reported falling in the home yesterday when walking without her walker and " lost her balance"  Pain Screening  Patient Currently in Pain: Yes  Pain Assessment  Pain Level: 6  Patient's Stated Pain Goal: No pain  Pain Type: Chronic pain  Pain Location: Back;Leg  Pain Orientation: Left  Pain Descriptors: Aching;Tingling;Numbness  Pain Frequency: Intermittent  Vital Signs  Patient Currently in Pain: Yes    Vision/Hearing  Vision  Vision: Within Functional Limits  Hearing  Hearing: Within functional limits    Orientation       Social/Functional History  Social/Functional History  Lives With: Family(brother/SIL)  Home Layout: Two level  Bathroom Shower/Tub: Walk-in Technical brewer: Public house manager: Accessible  Home Equipment: Chief Executive Officer Help From: Family  ADL Assistance: Needs assistance  Homemaking Assistance: Needs assistance  Ambulation Assistance: Independent  Transfer Assistance:  Independent  Active Driver: No(hasnt driven in a long while)  Occupation: Retired    Objective     Observation/Palpation  Posture: Poor(flexed posture leaning heavily on walker (which is way too low))    AROM RLE (degrees)  RLE General AROM: grossly assessed WFL except knee flexion 105  AROM LLE (degrees)  LLE General AROM: grossly  assessed WFL except knee flexion 105  AROM RUE (degrees)  RUE General AROM: (grossly assessed due to time restraints with ongoing assessment needed) minimal to no Active motion at shoulder due to failed TSR, hand/wrist/elbow grossly WFL  AROM LUE (degrees)  LUE General AROM: (grossly assessed due to time restraints with ongoing assessment needed), minimal to no active motion L shoulder due to recent fx, elbow/wrist/hand [facility]    Strength RLE  Comment: hip flex 3-, ext 2, abd 2+,knee ext 4, knee flex 3+, ankle 3  Strength LLE  Comment: hip flex 3-,ext 2, abd 2+,knee flex 3+,ext 4,ankle 3  Strength RUE  Comment: poor strength throughout R shoulder, elbow/wrist and hand WFL  Strength LUE  Comment: poor strength throughout L shoulder, elbow/wrist and hand WFL        Sensation  Overall Sensation Status: (intact light touch throughout LEs)  Bed mobility  Bridging: Dependent/Total  Rolling to Left: Minimal assistance  Rolling to Right: Minimal assistance  Supine to Sit: Minimal assistance  Sit to Supine: Minimal assistance  Transfers  Sit to Stand: Stand by assistance(elevated surfaces with significant effort/verbal cues for components and safety)  Stand to sit: Stand by assistance       Ambulation  Ambulation?: Yes  Ambulation 1  Device: Rolling Walker  Assistance: Modified Independent  Quality of Gait: flexed posture,  Gait Deviations: Slow Cadence;Decreased step length;Decreased step height  Distance: 50' x 2, limited functional distances with SOB and fatigue  Stairs/Curb  Stairs?: Yes  Stairs  # Steps : 3(negotiated steps x 3 with L railing,side stepping with single step sequence with modA of B hands on railing)  Balance  Posture: Poor  Sitting - Static: Good  Sitting - Dynamic: Good  Standing - Static: Fair  Standing - Dynamic: Fair                         Assessment   Conditions Requiring Skilled Therapeutic Intervention  Body structures, Functions, Activity limitations: Decreased functional mobility ;Decreased ADL  status;Decreased ROM;Decreased strength;Decreased safe awareness;Decreased posture;Decreased balance;Increased pain  Assessment: 69 yo medically complex patient presents with limitations in ROM,strength,unsteady gait and chronic pain. She has recently been moved to family members home due to decline in functional independence and safety with ongoing issues with falls. Pt demonstrated difficulty during eval due to hx of IBS with multiple trips to [facility] made with ongoing assessment needed for functional testing at later date due to time restraints. This pt will benefit from ongoing PT to address current limitations as well as falls prevention as she is high risk for falling. Pt presents with significant pain and weakness with plan to try PT on an outpatient basis ,but if not tolerated, then will get orders for home PT. Pt is motivated to participate in therapy and demonstrates fair rehab potential due to multiple comorbities and chronic pain issues.  Treatment Diagnosis: back pain with general weakness and falls  Prognosis: Fair  Decision Making: High Complexity  REQUIRES PT FOLLOW UP: Yes  Activity Tolerance  Activity Tolerance: Patient limited by pain;Patient limited by fatigue  Activity Tolerance:  Difficulty with IBS and in BR twice during eval, limited by fatigue, mild SOB observed during eval         Plan   Plan  Times per week: 2  Plan weeks: 8  Current Treatment Recommendations: Strengthening, ROM, Balance Training, Therapist, nutritional, Training and development officer, Stair training, Personnel officer, Pain Management, Modalities, Equipment Evaluation, Education, & procurement, Manual Therapy - Soft Tissue Mobilization, Home Exercise Program  Plan Comment: pt agreeable to POC    G-Code       OutComes Score  Oswestry CMS Modifier: CL (08/27/19 0938)  Oswestry Disability Scores %: 66.67 (08/27/19 0938)                                               AM-PAC Score             Goals  Short term goals  Time Frame for Short term  goals: 4 weeks  Short term goal 1: Improve B knee flexion to 120 degrees to enable I transfers from all surfaces  Short term goal 2: Increase B hip strength to 3/5 for I functional mobility  Short term goal 3: Demonstrate I functional transfers all surfaces  Short term goal 4: demonstrate I HEP  Long term goals  Time Frame for Long term goals : 8 weeks  Long term goal 1: Improve B LE strength to 4/5 for improved functional mobility  Long term goal 2: I ambulation with RW >/=1000' for improved community distances demonstrating good gt components and upright posture  Long term goal 3: Pt will negotiate 1 flight of steps in the home to access meals and socialization  Long term goal 4: Pt will verbalize/demonstrate I knowledge of home safety awareness and falls risk prevention with decreased falls by 100%  Long term goal 5: Improve Modified Oswestry Score from 37/50 to 30/50 for improved pain with daily activity  Patient Goals   Patient goals : decrease pain and be able to move around home better       Therapy Time   Individual Concurrent Group Co-treatment   Time In  248         Time Out  340         Minutes  [address], [place]

## 2019-08-30 NOTE — Care Coordination-Inpatient (Signed)
Physical Therapy  Cancellation/No-show Note  Patient Name:  Gabrielle Wells  DOB:  01/25/1951   Date:  08/30/2019  MRN: [id number]  Cancelled visits to date: 0  No-shows to date: 0    For today's appointment patient:  []   Cancelled  []   Rescheduled appointment  [x]   No-show     Reason given by patient:  []   Patient ill  []   Conflicting appointment  []   No transportation    []   Conflict with work  []   No reason given  []   Other:     Comments:      Electronically signed by:  Trula Ore, PT

## 2019-08-31 ENCOUNTER — Inpatient Hospital Stay: Payer: MEDICARE | Primary: Internal Medicine

## 2019-09-01 NOTE — Care Coordination-Inpatient (Signed)
Physical Therapy  Cancellation/No-show Note  Patient Name:  Gabrielle Wells  DOB:  [birth year]   Date:  09/01/2019  MRN: [id number]  Cancelled visits to date: 0  No-shows to date: 0    For today's appointment patient:  [x]   Cancelled  []   Rescheduled appointment  []   No-show     Reason given by patient:  []   Patient ill  []   Conflicting appointment  []   No transportation    []   Conflict with work  []   No reason given  []   Other:     Comments:  Back hurts and doesn't feel well  \\  Electronically signed by:  Trula Ore, PT

## 2019-09-02 ENCOUNTER — Inpatient Hospital Stay: Payer: MEDICARE | Primary: Internal Medicine

## 2019-09-08 ENCOUNTER — Inpatient Hospital Stay: Admit: 2019-09-08 | Payer: MEDICARE | Primary: Internal Medicine

## 2019-09-08 ENCOUNTER — Ambulatory Visit: Admit: 2019-09-08 | Discharge: 2019-09-08 | Payer: MEDICARE | Attending: Internal Medicine | Primary: Internal Medicine

## 2019-09-08 DIAGNOSIS — Z13820 Encounter for screening for osteoporosis: Secondary | ICD-10-CM

## 2019-09-08 MED ORDER — TETANUS-DIPHTH-ACELL PERTUSSIS 5-2.5-18.5 LF-MCG/0.5 IM SUSP
Freq: Once | INTRAMUSCULAR | Status: AC
Start: 2019-09-08 — End: 2019-09-08
  Administered 2019-09-08: 21:00:00 0.5 mL via INTRAMUSCULAR

## 2019-09-08 MED ORDER — OXYCODONE-ACETAMINOPHEN 5-325 MG PO TABS
5-325 MG | ORAL_TABLET | Freq: Two times a day (BID) | ORAL | 0 refills | Status: AC | PRN
Start: 2019-09-08 — End: 2019-09-15

## 2019-09-08 MED ORDER — PNEUMOCOCCAL 23-POLYVALENT VACC INJ
25 | INTRAMUSCULAR | Status: AC
Start: 2019-09-08 — End: 2019-09-08

## 2019-09-08 MED ORDER — PNEUMOCOCCAL VAC POLYVALENT 25 MCG/0.5ML IJ INJ
25 MCG/0.5ML | Freq: Once | INTRAMUSCULAR | Status: AC
Start: 2019-09-08 — End: 2019-09-08
  Administered 2019-09-08: 21:00:00 0.5 mL via INTRAMUSCULAR

## 2019-09-08 MED ORDER — TETANUS-DIPHTH-ACELL PERTUSSIS 5-2.5-18.5 LF-MCG/0.5 IM SUSP
5-2.5-18.5 | INTRAMUSCULAR | Status: AC
Start: 2019-09-08 — End: 2019-09-08

## 2019-09-08 MED ORDER — OMEPRAZOLE 20 MG PO CPDR
20 MG | ORAL_CAPSULE | Freq: Every day | ORAL | 2 refills | Status: DC
Start: 2019-09-08 — End: 2020-04-10

## 2019-09-08 MED ORDER — ESOMEPRAZOLE MAGNESIUM 20 MG PO PACK
20 MG | PACK | Freq: Every day | ORAL | 3 refills | Status: DC
Start: 2019-09-08 — End: 2019-09-08

## 2019-09-08 MED FILL — PNEUMOVAX 23 25 MCG/0.5ML IJ INJ: 25 MCG/0.5ML | INTRAMUSCULAR | Qty: 0.5

## 2019-09-08 MED FILL — BOOSTRIX 5-2.5-18.5 LF-MCG/0.5 IM SUSP: 5-2.5-18.5 LF-MCG/0.5 | INTRAMUSCULAR | Qty: 0.5

## 2019-09-08 NOTE — Progress Notes (Signed)
Department Of Internal Medicine  General Medicine/Primary Care  Established Patient Visit    Patient:  Gabrielle Wells                                               DOB: 08-03-1951  Age: 69 y.o.  MRN: [id number]  Date : 09/08/2019    History Obtained From:  Patient     REASON FOR VISIT:  Follow up lumbar radiculopathy     HISTORY OF PRESENT ILLNESS:   69 y/o F with PMH of lumbar radiculopathy and fracture s/p fusion complicated by surgical hardware infection and removal w/ long term antibiotic use (2018-2019), CHF, hypertension, incarcerated hernia repair with mesh x4 (2010), s/p hysterectomy who presents for follow up of lumbar radiculopathy and back wound.     Back Wound - Was following with Dr. Sherley Bounds, neurosurgery in [place]. Was last seen by them in 2019. Was following with infectious disease as well. Notes back wound is now tender for 1-2 weeks. Occasionally has some foul smelling drainage. Notes the drainage happens after sleeping on her back at night. No fevers or chills.     Lumbar Radiculopathy - has only been to one session of PT. Has appointment with PT today. States her family wants her to go to [place] for rehab. Still having pain 5-6/10 in intensity. Pain in lower back bilaterally.     Abdominal Pain - present since Mesh was placed in 2010. Pain is unchanged. Followed up with surgeons post-op and was told it may be related to scar tissue. No constipation. + Nausea intermittently. States she would like to see a Psychologist, sport and exercise to have the mesh removed.     IBS with diarrhea - has diarrhea after every meal. Takes imodium.     GERD - takes Nexium which helps with the reflux.     L shoulder pain - Patient reports she had a fall and then had a fracture. Patient states she was supposed to see a Psychologist, sport and exercise. Patient believes she can get charts and imaging from [place].     Did not address HTN, Heart Failure or Depression today.     Past Medical History:        Diagnosis Date   ??? CHF (congestive heart  failure) ([place])    ??? HTN (hypertension)    ??? Lumbar radiculopathy    ??? Neuropathic pain     back 2/2 to spinal surgeries   ??? Spinal fracture of T8 vertebra ([facility])     s/p fusion    ??? Surgical site infection     back, spinal hardware        Past Surgical History:    No past surgical history on file.    Family History:   No family history on file.    Social History:   TOBACCO:   reports that she has never smoked. She has never used smokeless tobacco.  ETOH:   has no history on file for alcohol.  OCCUPATION:      Allergies:  Betadine [povidone iodine], Coconut oil, Codeine, Fish-derived products, Ibuprofen, Iodine, Other, and Sulfa antibiotics    Current Medications:    Prior to Admission medications    Medication Sig Start Date End Date Taking? Authorizing Provider   omeprazole (PRILOSEC) 20 MG delayed release capsule Take 1 capsule by mouth every morning (before breakfast) 09/08/19  Yes Delmar Landau, MD   oxyCODONE-acetaminophen (PERCOCET) 5-325 MG per tablet Take 1 tablet by mouth 2 times daily as needed for Pain for up to 7 days. Intended supply: 5 days. Take lowest dose possible to manage pain 09/08/19 09/15/19 Yes Delmar Landau, MD   valsartan (DIOVAN) 80 MG tablet Take 1 tablet by mouth daily 08/04/19   Minna Antis, MD   Diclofenac Sodium 1 % CREA Apply 1 Tube topically 2 times daily 08/04/19   Minna Antis, MD   cloNIDine (CATAPRES) 0.2 MG tablet Take 0.2 mg by mouth 2 times daily    Historical Provider, MD   gabapentin (NEURONTIN) 300 MG capsule Take 300 mg by mouth 2 times daily.    Historical Provider, MD   METOPROLOL SUCCINATE PO Take by mouth    Historical Provider, MD   Cholecalciferol (VITAMIN D3) 125 MCG (5000 UT) TABS Take by mouth    Historical Provider, MD   citalopram (CELEXA) 40 MG tablet Take 40 mg by mouth daily    Historical Provider, MD   furosemide (LASIX) 80 MG tablet Take 80 mg by mouth daily    Historical Provider, MD   potassium chloride (KLOR-CON M) 20 MEQ TBCR extended release tablet Take 40  mEq by mouth 2 times daily    Historical Provider, MD       ROS: Review of Systems - Per HPI, otherwise 14-point system review of systems was completed and negative.     Physical Exam:     Vitals: BP 137/88 (Site: Right Lower Arm, Position: Sitting, Cuff Size: Large Adult)    Pulse 79    Temp 97 ??F (36.1 ??C) (Infrared)    Resp 16    Ht 5\' 3"  (1.6 m)    Wt 280 lb (127 kg)    SpO2 98%    BMI 49.60 kg/m??     Body mass index is 49.6 kg/m??.  Wt Readings from Last 3 Encounters:   09/08/19 280 lb (127 kg)   08/04/19 284 lb (128.8 kg)   07/28/19 275 lb 12.8 oz (125.1 kg)       Physical Examination:   ?? General appearance: Appears mildly uncomfortable, fully alert and orientated. Obese.     ?? HEENT: Atraumatic, normocephalic, moist mucus membranes  ?? Respiratory: Normal respiratory effort. No wheezes, rubs, or crackles  ?? Cardiovascular: regular S1/S2, with no Murmur, rub or gallop. No JVD  ?? Abdomen: Soft, mildly tender to palpation in all quadrants, non-distended, several scars noted   ?? Musculoskeletal: No clubbing, cyanosis, no lower extremity edema, peripheral pulses present, cap refill < 2sec  ?? Back: Lumbar back wound clean, dry, intact. No open areas. No erythema or drainage. Significantly TTP surrounding prior wound.   ?? Neurologic: Neurovascularly grossly intact without any focal motor deficits. Cranial nerves:  grossly non-focal. Uses walker to ambulate.     LABS:    Chemistry:      Lab Results   Component Value Date    LABA1C 5.7 08/04/2019     Lab Results   Component Value Date    EAG 116.9 08/04/2019         Hematology:    Lipid:  Lab Results   Component Value Date    CHOL 224 (H) 08/04/2019    HDL 62 (H) 08/04/2019    LDLCALC 137 (H) 08/04/2019    TRIG 124 08/04/2019     Assessment/Plan:    [name] is a 69 y.o. female who presents for  follow up of back wound    Ammy was seen today for 1 month follow-up.    Diagnoses and all orders for this visit:    Wound of back, unspecified laterality, subsequent  encounter  -      History of hardware infection requiring prolonged abx in the past. History of epidural abscess as well. Now with new onset tenderness surrounding wound. Wound itself does not look infected. Concerned for possible osteomyelitis. Has followed with ID in the past.   - XR LUMBAR SPINE (MIN 4 VIEWS); Future  -     SEDIMENTATION RATE; Future  -     C-REACTIVE PROTEIN; Future  -     CBC WITH AUTO DIFFERENTIAL; Future  - Pending results, may need referral to Neurosurgery and/or Infectious Disease    Midline low back pain without sciatica, unspecified chronicity  - XR LUMBAR SPINE (MIN 4 VIEWS); Future  -     SEDIMENTATION RATE; Future  -     C-REACTIVE PROTEIN; Future  -     CBC WITH AUTO DIFFERENTIAL; Future  -     oxyCODONE-acetaminophen (PERCOCET) 5-325 MG per tablet; Take 1 tablet by mouth 2 times daily as needed for Pain for up to 7 days. Intended supply: 5 days. Take lowest dose possible to manage pain  - Continue PT, planning to possibly go to [facility] rehab    Chronic left shoulder pain   - Patient reports she was supposed to see orthopedic surgeon   - Planning to have medical records and imaging from [facility] sent to office. Did have some records sent, but not from orthopedic surgery.    -  Will review, may need orthopedic surgery referral vs new imaging     Abdominal pain, unspecified abdominal location   - Patient has history of hernia s/p mesh in 2010. Feels the mesh is contributing to her abdominal pain along with her IBS. She would like a surgery referral for possible removal. Will need referral once acute issues with back resolved.     Screening for osteoporosis  -     DEXA BONE DENSITY 2 SITES; Future    Gastroesophageal reflux disease, unspecified whether esophagitis present  -     omeprazole (PRILOSEC) 20 MG delayed release capsule; Take 1 capsule by mouth every morning (before breakfast)    Encounter for screening colonoscopy  -     [place] [facility] [place] [facility], MD (Colonoscopy), General  Surgery, [facility]    Encounter for hepatitis C screening test for low risk patient  -     HEPATITIS C ANTIBODY; Future        Health Maintenance -   Pap Smear : s/p hysterectomy   Flex Sig/ Colonoscopy: referral given  Mammography: ordered at last visit  INFLUENZA: has not received  PNEUMONIA VACCINE: given today  Tdap: given today       Case discussed with preceptor.  Follow-up in  1 month with Dr. Kandice Robinsons.     Delmar Landau, MD  Internal Medicine Resident, PGY-3  09/08/2019  3:33 PM     Addendum to Resident H& P/Progress note:  I have personally seen,examined and evaluated the patient. I have reviewed the current history, physical findings, labs and assessment and plan and agree with note as documented by resident MD ( Dr.Keri Tavella)      Britt Bottom, MD, [place]

## 2019-09-08 NOTE — Patient Instructions (Addendum)
Please have blood work completed as soon as possible.     Have X-ray lumbar spine obtained. You have been given percocet to help with new back pain.     Will send for general surgery referral for hernia mesh after acute issues with back have been resolved.     Have DEXA scan and colonoscopy done when able.

## 2019-09-08 NOTE — Other (Signed)
Physical Therapy Daily Treatment Note  Date:09/08/2019    Patient Name:  Gabrielle Wells    DOB:  12/18/1950  MRN: [id number]  Restrictions/Precautions:    Restrictions/Precautions  Restrictions/Precautions: (as tolerated)   Medical/Treatment Diagnosis Information:  ?? Diagnosis: M54.16 Lumbar Radiculopathy  ?? Treatment Diagnosis: back pain with general weakness and falls  Insurance/Certification information:  PT Insurance Information: Medicare  Insurance Allowable Visits:    Physician Information:  Referring Practitioner: Minna Antis  MD Follow-up Visit:   Plan of care signed (Y/N):    Visit# / total visits: 2 /16  Pain level: 5/10     Progress Note Due (10 visits or 30 days, whichever is less):    Recertification Note Due (End of POC or 90 days, whichever is less):      Subjective:  Has had 1 new fall since last seen. Reports pain in back today is 5/10    Pain Screening  Patient Currently in Pain: Yes  Pain Assessment  Pain Assessment: 0-10  Pain Level: 5  Patient's Stated Pain Goal: No pain  Pain Type: Chronic pain  Pain Location: Back, Leg  Pain Orientation: Left  Pain Descriptors: Aching, Tingling, Numbness  Pain Frequency: Intermittent     Objective:  ??? Observation:   ??? Test measurements:      Exercises, Neuro Facilitation & Gait Training [phone], [id number], [id number]):  Activity Resistance/Repetitions Other comments   AP,QS,GS 10 ea    HS,AS,SLR,LAQ, 10 ea    bridges 10         Calf stretch 30 sec x 3                                            Bike 5 min      Therapeutic Activities ([id number]): Thirty sec sit > stand: 5, TUG; 34 sec    Home Exercise Program:   Will provide next visit pending pain/soreness from today    Manual Treatments ([id number]): NA     Modalities:  INF/heat x 15 low back    Timed Code Treatment Minutes:  TE x 46    Total Treatment Minutes:  61 min    Treatment/Activity Tolerance:  []  Patient tolerated treatment well []  Patient limited by fatigue  [x]  Patient limited by pain  [x]  Patient limited by other  medical complications  []  Other:     Assessment: Tolerated Thirty sec sit to stand and TUG functional testing with scores indicating high risk for falls and pt continues to have ongoing falls with another fall in the home since last seen. Pt with fair strength in B LEs with poor muscle endurance and demonstrates fatigue with 10 reps.  Pt also with poor endurance overall due to minimal activity in the home with report of lying in bed majority of the time due to pain and also "what else is there to do". Pt tolerated therapy well today and reported feeling better with INF following activity.    Prognosis: []  Good [x]  Fair  []  Poor    Patient Requires Follow-up: [x]  Yes  []  No    Goals:  Short term goals  Time Frame for Short term goals: 4 weeks  Short term goal 1: Improve B knee flexion to 120 degrees to enable I transfers from all surfaces  Short term goal 2: Increase B hip strength to 3/5 for I functional mobility  Short term goal  3: Demonstrate I functional transfers all surfaces  Short term goal 4: demonstrate I HEP  Long term goals  Time Frame for Long term goals : 8 weeks  Long term goal 1: Improve B LE strength to 4/5 for improved functional mobility  Long term goal 2: I ambulation with RW >/=1000' for improved community distances demonstrating good gt components and upright posture  Long term goal 3: Pt will negotiate 1 flight of steps in the home to access meals and socialization  Long term goal 4: Pt will verbalize/demonstrate I knowledge of home safety awareness and falls risk prevention with decreased falls by 100%  Long term goal 5: Improve Modified Oswestry Score from 37/50 to 30/50 for improved pain with daily activity    Plan:   Visits per week:    # of weeks:      []  Continue per plan of care []  Alter current plan (see comments)  [x]  Plan of care initiated []  Hold pending MD visit []  Discharge    Plan for Next Session:  progress as tolerated    Electronically signed by:  Lowella Curb

## 2019-09-09 ENCOUNTER — Encounter

## 2019-09-13 ENCOUNTER — Inpatient Hospital Stay: Admit: 2019-09-13 | Payer: MEDICARE | Primary: Internal Medicine

## 2019-09-13 NOTE — Other (Signed)
Physical Therapy Daily Treatment Note  Date:09/13/2019    Patient Name:  Gabrielle Wells    DOB:  14-Sep-1950  MRN: [id number]  Restrictions/Precautions:    Restrictions/Precautions  Restrictions/Precautions: (as tolerated)   Medical/Treatment Diagnosis Information:  ?? Diagnosis: M54.16 Lumbar Radiculopathy  ?? Treatment Diagnosis: back pain with general weakness and falls  Insurance/Certification information:  PT Insurance Information: Medicare  Insurance Allowable Visits:    Physician Information:  Referring Practitioner: Minna Antis  MD Follow-up Visit:   Plan of care signed (Y/N):    Visit# / total visits: 3 /16  Pain level: 3/10     Progress Note Due (10 visits or 30 days, whichever is less):  [id number]  Recertification Note Due (End of POC or 90 days, whichever is less):      Subjective:  reports pain "not too bad today" rated 3/10 in back, denies new falls , reports back hasnt been this good in a while    Pain Screening  Patient Currently in Pain: Yes  Pain Assessment  Pain Assessment: 0-10  Pain Level: 3  Patient's Stated Pain Goal: No pain  Pain Type: Chronic pain  Pain Location: Back, Leg  Pain Orientation: Left  Pain Descriptors: Aching, Tingling, Numbness  Pain Frequency: Intermittent     Objective:  ??? Observation:   ??? Test measurements:      Exercises, Neuro Facilitation & Gait Training ([id number], [id number], [id number]):  Activity Resistance/Repetitions Other comments   AP,add set,GS 15 ea    HS,AS,SLR,LAQ, 15 ea    bridges 15         Calf stretch 30 sec x 3         Step ups 3-4" box 10 ea B UE support        Standing toe raises X 10 B UE support   Standing hip abd  x10 "   Standing alt hip flex x10 "             Bike 5 min      Therapeutic Activities ([id number]): Thirty sec sit > stand: 5, TUG; 34 sec    Home Exercise Program:   Instruction in written HEP for supine/sitting exercises only    Manual Treatments ([id number]): NA     Modalities:  INF x 15 low back in sitting position    Timed Code Treatment Minutes:  TE x  35    Total Treatment Minutes:  50 min    Treatment/Activity Tolerance:  []  Patient tolerated treatment well []  Patient limited by fatigue  [x]  Patient limited by pain  [x]  Patient limited by other medical complications  []  Other:     Assessment: Tolerated therapy with minimal to no increase in pain, moderate difficulty due to general decrease in endurance, mild SOB but is WNL, verbalized understanding of written HEP to start doing at home daily     Prognosis: []  Good [x]  Fair  []  Poor    Patient Requires Follow-up: [x]  Yes  []  No    Goals:  Short term goals  Time Frame for Short term goals: 4 weeks  Short term goal 1: Improve B knee flexion to 120 degrees to enable I transfers from all surfaces  Short term goal 2: Increase B hip strength to 3/5 for I functional mobility  Short term goal 3: Demonstrate I functional transfers all surfaces  Short term goal 4: demonstrate I HEP  Long term goals  Time Frame for Long term goals : 8 weeks  Long term goal 1:  Improve B LE strength to 4/5 for improved functional mobility  Long term goal 2: I ambulation with RW >/=1000' for improved community distances demonstrating good gt components and upright posture  Long term goal 3: Pt will negotiate 1 flight of steps in the home to access meals and socialization  Long term goal 4: Pt will verbalize/demonstrate I knowledge of home safety awareness and falls risk prevention with decreased falls by 100%  Long term goal 5: Improve Modified Oswestry Score from 37/50 to 30/50 for improved pain with daily activity    Plan:   Visits per week:    # of weeks:      []  Continue per plan of care []  Alter current plan (see comments)  [x]  Plan of care initiated []  Hold pending MD visit []  Discharge    Plan for Next Session:  progress as tolerated    Electronically signed by:  Lowella Curb

## 2019-09-15 ENCOUNTER — Inpatient Hospital Stay: Admit: 2019-09-15 | Payer: MEDICARE | Primary: Internal Medicine

## 2019-09-15 NOTE — Progress Notes (Signed)
Physical Therapy        Outpatient Physical Therapy  Phone: [phone] Fax: [phone]    To: Hina Virk     From: Trula Ore, PT   Date: 09/15/2019  Patient: Gabrielle Wells     DOB: 1951-04-22 MRN: [id number]  Medical Diagnosis:  M54.16 Lumbar Radiculopathy  Treatment Diagnosis:  back pain with general weakness and falls      Physical Therapy Progress Note    Time Period for Report:  08/25/19-09/15/19    Total Visits to date:   4 Cancels/No-shows to date:  2    Plan of Care/Treatment to date:  [x]  Therapeutic Exercise (Review/Progress HEP and provide verbal/tactile cueing for activities related to strengthening, flexibility,  endurance, ROM.)       [x]  Therapeutic Activity (Provide verbal/tactile cueing for dynamic activities to promote functional tasks.)          [x]  Gait Training (Provide verbal/tactile/visual cueing for facilitation of normalized gait pattern without or with the least restrictive AD to decrease pain and/or risk for falling.)          [x]  Neuromuscular Re-education (Review/Progress HEP and provide verbal/tactile cueing for activities related to improving balance, coordination, kinesthetic sense, posture, motor skill, proprioception.)         [x]  Manual Therapy (Provide manual therapy to mobilize soft tissue/joints for the purpose of modulating pain, promoting relaxation, increasing ROM, reducing/eliminating soft tissue swelling/inflammation/tightness, improving soft tissue extensibility)                [x]  Modalities (For modulating pain/tenderness/paresthesias, reducing swelling/inflammation/tightness, improving soft tissue extensibility, and/or to increase muscle tone/strength):     []  Ultrasound  []  Electrical Stimulation        []  Cervical Traction []  Lumbar Traction    ?   []  Cold/hotpack []  Iontophoresis   Other:      []           []      Significant Findings At Last Visit/Comments:     - 1 new fall in the home  - improving tolerance and participation in therapy  - back pain decreased  from 6/10 to 4/10 with no radicular pain  - hip flexion/extension improved to 3/5, abduction 3- with improving functional mobility  - recommended continued use of AD for safety at this time  - limited by fatigue and SOB,84/100HR, 97-99% 02/RA    Progress towards goals:    Short term goals  Time Frame for Short term goals: 4 weeks  Short term goal 1: Improve B knee flexion to 120 degrees to enable I transfers from all surfaces  Short term goal 2: Increase B hip strength to 3/5 for I functional mobility- met 1/27  Short term goal 3: Demonstrate I functional transfers all surfaces  Short term goal 4: demonstrate I HEP  Long term goals  Time Frame for Long term goals : 8 weeks  Long term goal 1: Improve B LE strength to 4/5 for improved functional mobility  Long term goal 2: I ambulation with RW >/=1000' for improved community distances demonstrating good gt components and upright posture  Long term goal 3: Pt will negotiate 1 flight of steps in the home to access meals and socialization  Long term goal 4: Pt will verbalize/demonstrate I knowledge of home safety awareness and falls risk prevention with decreased falls by 100%  Long term goal 5: Improve Modified Oswestry Score from 37/50 to 30/50 for improved pain with daily activity      Frequency/Duration:  cont per original POC  # Days per week: []  1 day # Weeks: []  1 week []  4 weeks      [x]  2 days?   []  2 weeks []  5 weeks      []  3 days   []  3 weeks [x]  8 weeks     Rehab Potential: []  Excellent []  Good [x]  Fair  []  Poor     Goal Status:  []  Achieved []  Partially Achieved  []  Not Achieved     Patient Status: [x]  Continue per initial plan of Care     []  Patient now discharged     []  Additional visits requested, Please re-certify for additional visits:      Requested frequency/duration:  X/week for weeks    Electronically signed by:  Trula Ore, PT    If you have any questions or concerns, please don't hesitate to call.  Thank you for your referral.    Physician  Signature:________________________________Date:__________________  By signing above, therapist???s plan is approved by physician

## 2019-09-15 NOTE — Other (Signed)
Physical Therapy Daily Treatment Note  Date:09/15/2019    Patient Name:  Gabrielle Wells    DOB:  July 16, 1951  MRN: [id number]  Restrictions/Precautions:    Restrictions/Precautions  Restrictions/Precautions: (as tolerated)   Medical/Treatment Diagnosis Information:  ?? Diagnosis: M54.16 Lumbar Radiculopathy  ?? Treatment Diagnosis: back pain with general weakness and falls  Insurance/Certification information:  PT Insurance Information: Medicare  Insurance Allowable Visits:    Physician Information:  Referring Practitioner: Minna Antis  MD Follow-up Visit:   Plan of care signed (Y/N):    Visit# / total visits: 4/16  Pain level: 4/10     Progress Note Due (10 visits or 30 days, whichever is less):  9/73/53  Recertification Note Due (End of POC or 90 days, whichever is less):      Subjective: pt presents to clinic without AD "I just wanted to see if I could do it", reports walker is in the car. C/o back pain 4/10, denies falls since last seen.    Pain Screening  Patient Currently in Pain: Yes  Pain Assessment  Pain Assessment: 0-10  Pain Level: 3  Patient's Stated Pain Goal: No pain  Pain Type: Chronic pain  Pain Location: Back, Leg  Pain Orientation: Left  Pain Descriptors: Aching, Tingling, Numbness  Pain Frequency: Intermittent     Objective:  ??? Observation: ambulated into clinic without AD,SBA for safety  Test measurements:  84/100HR, 97-99% 02/RA    Exercises, Neuro Facilitation & Gait Training [phone], [id number], [id number]):  Activity Resistance/Repetitions Other comments   AP,HS,AS,LAQ 15 ea    SLR 10 ea    bridges 10         Calf stretch 30 sec x 3         Step ups/lateral  3-4" box 10 ea B UE support        Standing toe raises X 15 B UE support   Standing hip abd  x15 "   Standing alt hip flex x15 "             Bike 5 min      Therapeutic Activities (26834): Pt instructed in daily walking program starting 1 -3 min (as tolerated) 3x/day with increase 1 min every day. Instructed pt to use AD in community for  uneven/longer distances and instruction in progression off device in home first. Recommended use of RW especially when snowy/slippery surfaces as seen today.     Home Exercise Program:   Added walking program     Manual Treatments (19622): NA     Modalities:  INF x 15 low back in sitting position    Timed Code Treatment Minutes:  TE x 40 min    Total Treatment Minutes:  55 min    Treatment/Activity Tolerance:  []  Patient tolerated treatment well []  Patient limited by fatigue  [x]  Patient limited by pain  [x]  Patient limited by other medical complications  []  Other:     Assessment: Pt demonstrating improved hip strength to 3/5 with I bridges and SLR. Is tolerating increased activity and reps without increased pain but c/o fatigue and SOB. HR/02 levels maintained WNL, requires frequent brief rest periods throughout visit. Verbalized understanding of safety education provided with use of AD for falls prevention and walking program. Pt is making good progress with therapy and will benefit from continued PT to maximize functional safety and independence.    Prognosis: []  Good [x]  Fair  []  Poor    Patient Requires Follow-up: [x]  Yes  []  No  Goals:  Short term goals  Time Frame for Short term goals: 4 weeks  Short term goal 1: Improve B knee flexion to 120 degrees to enable I transfers from all surfaces  Short term goal 2: Increase B hip strength to 3/5 for I functional mobility- met 1/27  Short term goal 3: Demonstrate I functional transfers all surfaces  Short term goal 4: demonstrate I HEP  Long term goals  Time Frame for Long term goals : 8 weeks  Long term goal 1: Improve B LE strength to 4/5 for improved functional mobility  Long term goal 2: I ambulation with RW >/=1000' for improved community distances demonstrating good gt components and upright posture  Long term goal 3: Pt will negotiate 1 flight of steps in the home to access meals and socialization  Long term goal 4: Pt will verbalize/demonstrate I knowledge  of home safety awareness and falls risk prevention with decreased falls by 100%  Long term goal 5: Improve Modified Oswestry Score from 37/50 to 30/50 for improved pain with daily activity    Plan:   Visits per week:    # of weeks:      []  Continue per plan of care []  Alter current plan (see comments)  [x]  Plan of care initiated []  Hold pending MD visit []  Discharge    Plan for Next Session:  progress as tolerated    Electronically signed by:  Lowella Curb

## 2019-09-16 ENCOUNTER — Ambulatory Visit: Payer: MEDICARE | Primary: Internal Medicine

## 2019-09-20 ENCOUNTER — Inpatient Hospital Stay: Admit: 2019-09-20 | Payer: MEDICARE | Primary: Internal Medicine

## 2019-09-20 ENCOUNTER — Encounter

## 2019-09-20 DIAGNOSIS — Z1231 Encounter for screening mammogram for malignant neoplasm of breast: Secondary | ICD-10-CM

## 2019-09-22 ENCOUNTER — Inpatient Hospital Stay: Admit: 2019-09-22 | Payer: MEDICARE | Primary: Internal Medicine

## 2019-09-22 DIAGNOSIS — M5416 Radiculopathy, lumbar region: Secondary | ICD-10-CM

## 2019-09-22 NOTE — Other (Signed)
Physical Therapy Daily Treatment Note  Date:09/22/2019    Patient Name:  Gabrielle Wells    DOB:  Nov 24, 1950  MRN: [id number]  Restrictions/Precautions:    Restrictions/Precautions  Restrictions/Precautions: (as tolerated)   Medical/Treatment Diagnosis Information:  ?? Diagnosis: M54.16 Lumbar Radiculopathy  ?? Treatment Diagnosis: back pain with general weakness and falls  Insurance/Certification information:  PT Insurance Information: Medicare  Insurance Allowable Visits:    Physician Information:  Referring Practitioner: Minna Antis  MD Follow-up Visit:   Plan of care signed (Y/N):    Visit# / total visits: 5/16  Pain level: 4/10     Progress Note Due (10 visits or 30 days, whichever is less):  0/98/11  Recertification Note Due (End of POC or 90 days, whichever is less):      Subjective:  C/o back pain 4/10, denies falls since last seen. Presented to clinic using RW . My legs are doing good, c/o increased SOB with activity    Pain Screening  Patient Currently in Pain: Yes  Pain Assessment  Pain Assessment: 0-10  Pain Level: 3  Patient's Stated Pain Goal: No pain  Pain Type: Chronic pain  Pain Location: Back, Leg  Pain Orientation: Left  Pain Descriptors: Aching, Tingling, Numbness  Pain Frequency: Intermittent     Objective:  ??? Observation: ambulated into clinic without AD,SBA for safety  Test measurements:  84/100HR, 97-99% 02/RA    Exercises, Neuro Facilitation & Gait Training [phone], [id number], [id number]):  Activity Resistance/Repetitions Other comments   AS 15     SLR 2x10 ea    bridges 10    Post pelvic tilts 10    Calf stretch 30 sec x 3         Step ups/lateral  6" box 10 ea B UE support        Standing toe raises X 15 B UE support   Standing hip abd  x15 "   Standing alt hip flex x15 "   Stair training X 4 steps in clinic B railing         NUSTEP 5 min,level 6      Therapeutic Activities (29562): Pt instructed in daily walking program starting 1 -3 min (as tolerated) 3x/day with increase 1 min every day. Instructed  pt to use AD in community for uneven/longer distances and instruction in progression off device in home first. Recommended use of RW especially when snowy/slippery surfaces as seen today.     Home Exercise Program:   Added pelvic tilts    Manual Treatments (13086): NA     Modalities:     Timed Code Treatment Minutes:  TE x 44 min    Total Treatment Minutes:  44 min    Treatment/Activity Tolerance:  [] Patient tolerated treatment well [] Patient limited by fatigue  [x] Patient limited by pain  [x] Patient limited by other medical complications  [] Other:     Assessment:Pt is progressing well with increased strength and tolerance to activity, SOB is WNL and appears to be due to poor endurance vs cardiac with 02 sats maintained WNL. Pt is demonstrating I sit to stand and sit to supine transfers but continues with difficulty with supine to sit reaching for arm of chair or walker to push up on.She is making good progress with therapy and will benefit from continued PT to maximize functional safety and independence. Reported "feeling good" and denied increased pain following visit.    Prognosis: [] Good [x] Fair  [] Poor  Patient Requires Follow-up: [x] Yes  [] No    Goals:  Short term goals  Time Frame for Short term goals: 4 weeks  Short term goal 1: Improve B knee flexion to 120 degrees to enable I transfers from all surfaces  Short term goal 2: Increase B hip strength to 3/5 for I functional mobility- met 1/27  Short term goal 3: Demonstrate I functional transfers all surfaces  Short term goal 4: demonstrate I HEP  Long term goals  Time Frame for Long term goals : 8 weeks  Long term goal 1: Improve B LE strength to 4/5 for improved functional mobility  Long term goal 2: I ambulation with RW >/=1000' for improved community distances demonstrating good gt components and upright posture  Long term goal 3: Pt will negotiate 1 flight of steps in the home to access meals and socialization  Long term goal 4: Pt will  verbalize/demonstrate I knowledge of home safety awareness and falls risk prevention with decreased falls by 100%  Long term goal 5: Improve Modified Oswestry Score from 37/50 to 30/50 for improved pain with daily activity    Plan:   Visits per week:    # of weeks:      [] Continue per plan of care [] Alter current plan (see comments)  [x] Plan of care initiated [] Hold pending MD visit [] Discharge    Plan for Next Session:  progress as tolerated    Electronically signed by:  Lowella Curb

## 2019-09-24 ENCOUNTER — Inpatient Hospital Stay: Admit: 2019-09-24 | Payer: MEDICARE | Primary: Internal Medicine

## 2019-09-24 NOTE — Other (Signed)
Physical Therapy Daily Treatment Note  Date:09/24/2019    Patient Name:  Gabrielle Wells    DOB:  08/29/1950  MRN: [id number]  Restrictions/Precautions:    Restrictions/Precautions  Restrictions/Precautions: (as tolerated)   Medical/Treatment Diagnosis Information:  ?? Diagnosis: M54.16 Lumbar Radiculopathy  ?? Treatment Diagnosis: back pain with general weakness and falls  Insurance/Certification information:  PT Insurance Information: Medicare  Insurance Allowable Visits:    Physician Information:  Referring Practitioner: Minna Antis  MD Follow-up Visit:   Plan of care signed (Y/N):    Visit# / total visits: 6/16  Pain level: 8/10     Progress Note Due (10 visits or 30 days, whichever is less):  2/95/62  Recertification Note Due (End of POC or 90 days, whichever is less):      Subjective:  C/o  Increased back pain 8/10, denies falls since last seen. Presented to clinic without AD, left walker in the other car.    Pain Screening  Patient Currently in Pain: Yes  Pain Assessment  Pain Assessment: 0-10  Pain Level: 3  Patient's Stated Pain Goal: No pain  Pain Type: Chronic pain  Pain Location: Back, Leg  Pain Orientation: Left  Pain Descriptors: Aching, Tingling, Numbness  Pain Frequency: Intermittent     Objective:  ??? Observation: ambulated into clinic without AD,SBA for safety  Test measurements:  84/100HR, 97-99% 02/RA, B knee flexion 105    Exercises, Neuro Facilitation & Gait Training [phone], [id number], [id number]):  Activity Resistance/Repetitions Other comments   AS,GS,QS,LAQ 15     SLR 2x10 ea    Bridges on ball 15    Post pelvic tilts 10    Calf stretch 30 sec x 3    Hamstring stretch passive Instructed in HEP    10 ea B UE support   Step overs 5 ea    Standing toe raises X 15 B UE support   Standing hip abd  x15 "   Standing alt hip flex x15 "   Stair training X 4 steps in clinic B railing         Bike 5 min,      Therapeutic Activities (57846): Pt instructed in daily walking program starting 1 -3 min (as tolerated)  3x/day with increase 1 min every day. Instructed pt to use AD in community for uneven/longer distances and instruction in progression off device in home first. Recommended use of RW especially when snowy/slippery surfaces as seen today.     Home Exercise Program:   Step ups not done today due to increased pain and fatigue, performed step overs x 5 ea with B UE support    Manual Treatments (97140): manual stretching B hamstrings with instruction in self stretch with belt long sitting for HEP     Modalities: INF x 15 low back in supine position with legs up on stool    Timed Code Treatment Minutes:  INF x 15, TE x 40    Total Treatment Minutes:  55 min    Treatment/Activity Tolerance:  []  Patient tolerated treatment well []  Patient limited by fatigue  [x]  Patient limited by pain  [x]  Patient limited by other medical complications  []  Other:     Assessment. B knee flexion improved from 105 to L 115 and R 110. Moderate tightness in hamstrings,demonstrated I understanding of hamstring stretch added to HEP. Unsteady gait demonstrating wide BOS and increased lateral sway/displacement. Tolerated exercises with baseline fatigue and SOB but reported feeling 50% better following visit. Recommend continued  PT to reach max functional potential and independence.    Prognosis: []  Good [x]  Fair  []  Poor    Patient Requires Follow-up: [x]  Yes  []  No    Goals:  Short term goals  Time Frame for Short term goals: 4 weeks  Short term goal 1: Improve B knee flexion to 120 degrees to enable I transfers from all surfaces- partially-ongoing  Short term goal 2: Increase B hip strength to 3/5 for I functional mobility- met 1/27  Short term goal 3: Demonstrate I functional transfers all surfaces- partially met-ongoing  Short term goal 4: demonstrate I HEP- ongoing  Long term goals  Time Frame for Long term goals : 8 weeks  Long term goal 1: Improve B LE strength to 4/5 for improved functional mobility- ongoing  Long term goal 2: I ambulation  with RW >/=1000' for improved community distances demonstrating good gt components and upright posture- ongoing  Long term goal 3: Pt will negotiate 1 flight of steps in the home to access meals and socialization- ongoing  Long term goal 4: Pt will verbalize/demonstrate I knowledge of home safety awareness and falls risk prevention with decreased falls by 100%- ongoing  Long term goal 5: Improve Modified Oswestry Score from 37/50 to 30/50 for improved pain with daily activity- ongoing    Plan:   Visits per week:    # of weeks:      []  Continue per plan of care []  Alter current plan (see comments)  [x]  Plan of care initiated []  Hold pending MD visit []  Discharge    Plan for Next Session:  progress as tolerated    Electronically signed by:  Lowella Curb

## 2019-09-27 ENCOUNTER — Inpatient Hospital Stay: Admit: 2019-09-27 | Payer: MEDICARE | Primary: Internal Medicine

## 2019-09-27 NOTE — Other (Signed)
Physical Therapy Daily Treatment Note  Date:09/27/2019    Patient Name:  Gabrielle Wells    DOB:  Jun 28, 1951  MRN: [id number]  Restrictions/Precautions:    Restrictions/Precautions  Restrictions/Precautions: (as tolerated)   Medical/Treatment Diagnosis Information:  ?? Diagnosis: M54.16 Lumbar Radiculopathy  ?? Treatment Diagnosis: back pain with general weakness and falls  Insurance/Certification information:  PT Insurance Information: Medicare  Insurance Allowable Visits:    Physician Information:  Referring Practitioner: Minna Antis  MD Follow-up Visit:   Plan of care signed (Y/N):    Visit# / total visits: 7/16  Pain level: 4/10     Progress Note Due (10 visits or 30 days, whichever is less):  0/98/11  Recertification Note Due (End of POC or 90 days, whichever is less):      Subjective:  Pain is pretty good today, pain decreased to 4/10, pt reports she is moving a little more and  Got out to church over the weekend with assist of god daughter    Pain Screening  Patient Currently in Pain: Yes  Pain Assessment  Pain Assessment: 0-10  Pain Level: 3  Patient's Stated Pain Goal: No pain  Pain Type: Chronic pain  Pain Location: Back, Leg  Pain Orientation: Left  Pain Descriptors: Aching, Tingling, Numbness  Pain Frequency: Intermittent     Objective:  ??? Observation: ambulated into clinic without AD, Trendelenburg gt sequence, wide BOS  Test measurements:  84/100HR, 97-99% 02/RA, B knee flexion 105    Exercises, Neuro Facilitation & Gait Training (97110, [id number], [id number]):  Activity Resistance/Repetitions Other comments    15     SLR 10 ea    Bridges on ball 15    Post pelvic tilts 10 sec holds, x 10    Calf stretch 30 sec x 3    Hamstring stretch passive Instructed in HEP    10 ea B UE support    5 ea    Standing toe raises X 15 B UE support   Standing hip abd  X15, red tband "   Standing hip flex/ext X 10, red tband "    X 4 steps in clinic B railing    Side stepping in parallel bars 3 laps No UE assist   Bike 5 min,       Therapeutic Activities (91478):     Home Exercise Program:   Progressed hip strengthening in standing with red tband,added side stepping length of parallel bars x 3 laps    Manual Treatments (97140): manual stretching B hamstrings and piriformis    Modalities: INF x 15 low back in supine position with legs up on stool    Timed Code Treatment Minutes:  INF x 15, MT x 11, TE x 30    Total Treatment Minutes:  56 min    Treatment/Activity Tolerance:  []  Patient tolerated treatment well []  Patient limited by fatigue  [x]  Patient limited by pain  [x]  Patient limited by other medical complications  []  Other:     Assessment. Tolerated exercises with less fatigue and no increase in pain, reports "back is tired" following activity,  demonstrated increased functional speed and gt components following exercises, and is showing good tolerance.  Recommend continued PT to reach max functional potential and independence with decreased risk for falls    Prognosis: []  Good [x]  Fair  []  Poor    Patient Requires Follow-up: [x]  Yes  []  No    Goals:  Short term goals  Time Frame for Short term goals:  4 weeks  Short term goal 1: Improve B knee flexion to 120 degrees to enable I transfers from all surfaces- partially-ongoing  Short term goal 2: Increase B hip strength to 3/5 for I functional mobility- met 1/27  Short term goal 3: Demonstrate I functional transfers all surfaces- partially met-ongoing  Short term goal 4: demonstrate I HEP- ongoing  Long term goals  Time Frame for Long term goals : 8 weeks  Long term goal 1: Improve B LE strength to 4/5 for improved functional mobility- ongoing  Long term goal 2: I ambulation with RW >/=1000' for improved community distances demonstrating good gt components and upright posture- ongoing  Long term goal 3: Pt will negotiate 1 flight of steps in the home to access meals and socialization- ongoing  Long term goal 4: Pt will verbalize/demonstrate I knowledge of home safety awareness and falls  risk prevention with decreased falls by 100%- ongoing  Long term goal 5: Improve Modified Oswestry Score from 37/50 to 30/50 for improved pain with daily activity- ongoing    Plan:   Visits per week:    # of weeks:      []  Continue per plan of care []  Alter current plan (see comments)  [x]  Plan of care initiated []  Hold pending MD visit []  Discharge    Plan for Next Session:  progress as tolerated    Electronically signed by:  Lowella Curb

## 2019-09-29 ENCOUNTER — Encounter

## 2019-09-29 ENCOUNTER — Inpatient Hospital Stay: Admit: 2019-09-29 | Payer: MEDICARE | Primary: Internal Medicine

## 2019-09-29 DIAGNOSIS — M810 Age-related osteoporosis without current pathological fracture: Secondary | ICD-10-CM

## 2019-09-29 IMAGING — DX DG CHEST 1V PORT
1 series · 1 of 1 positions shown · non-contrast
Comparison: 06/16/2018

CLINICAL DATA: 67-year-old female with a history of CHF

EXAM:
PORTABLE CHEST 1 VIEW

[chest ap]
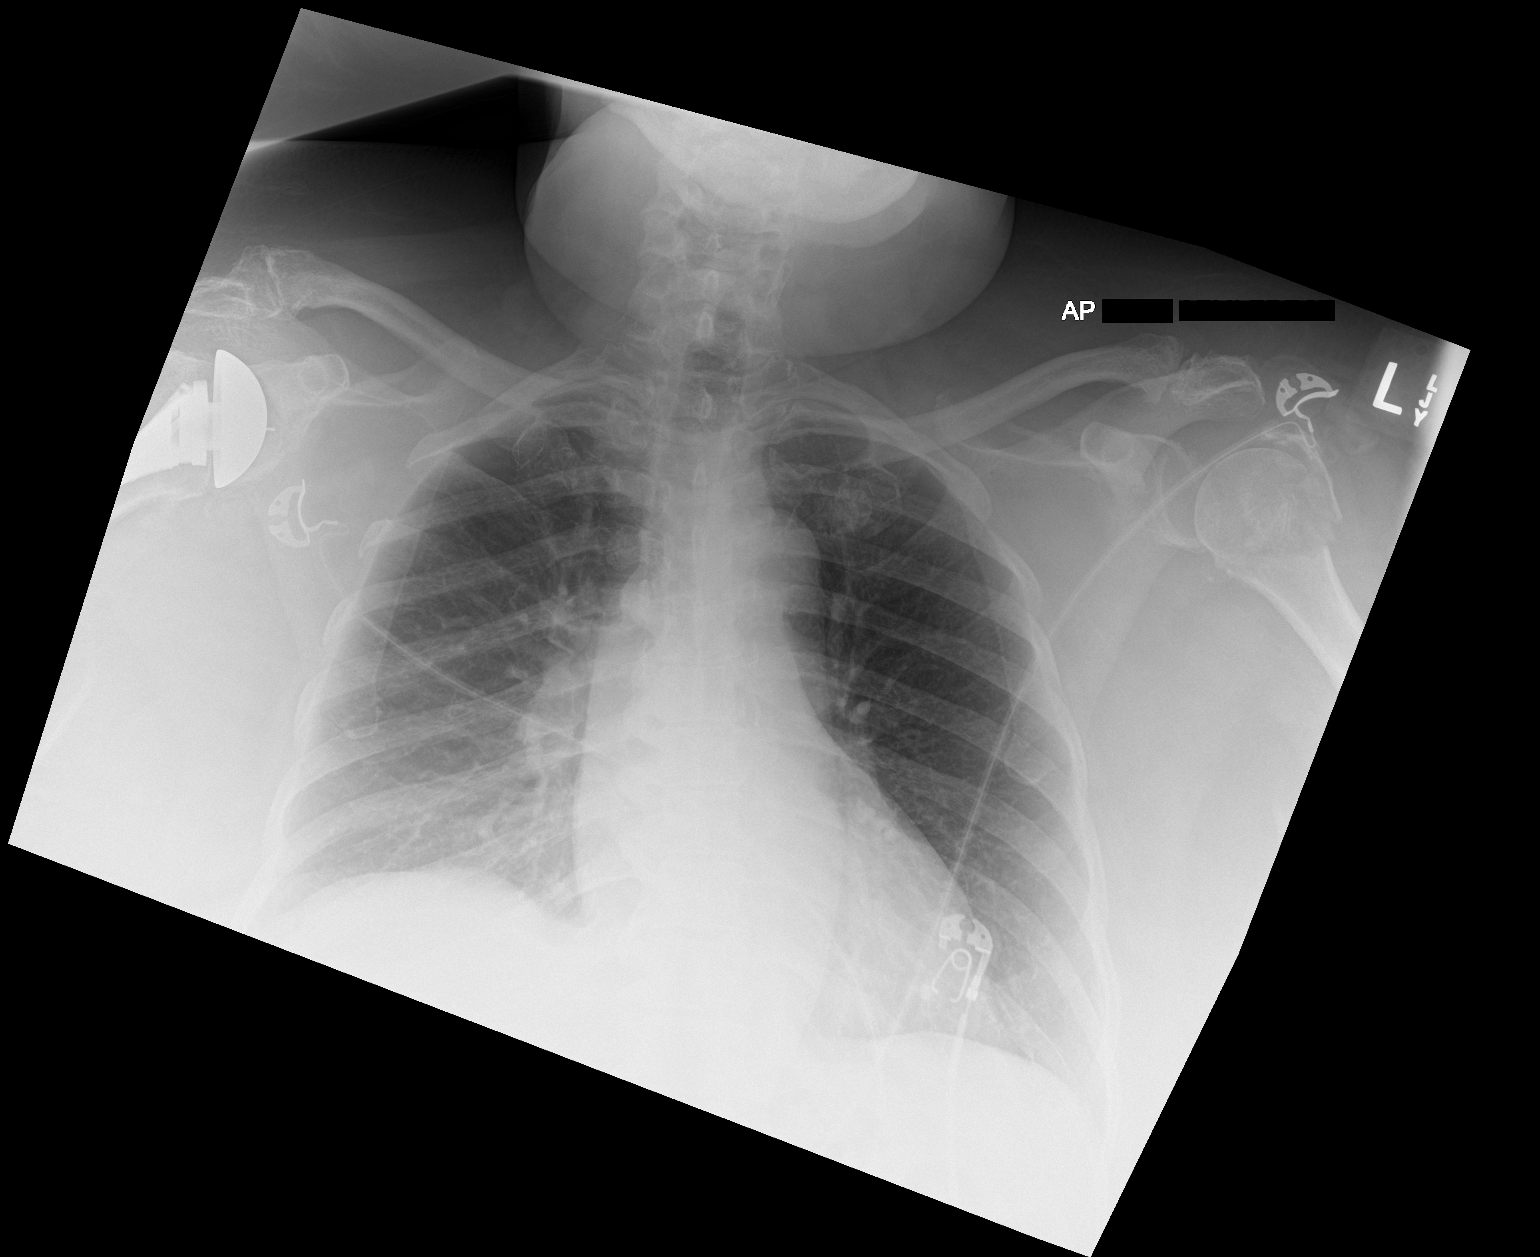

[1 of 1 positions shown; findings below may reference images not displayed]

FINDINGS: Cardiomediastinal silhouette unchanged in size and contour.

No pneumothorax. No pleural effusion. No confluent airspace disease.
No interlobular septal thickening.

Comminuted fracture of the proximal left humerus.

Surgical changes of the right proximal humerus. Degenerative changes
of the acromioclavicular joints.
IMPRESSION: Negative for acute cardiopulmonary disease.

Comminuted fracture of the left humerus.

Surgical changes of the right humerus.

## 2019-09-29 IMAGING — MR MR THORACIC SPINE W/O CM
2 series · 14 of 26 positions shown · non-contrast
Comparison: CT thoracic and lumbar spine 06/16/2018.

Addendum:
CLINICAL DATA: 67-year-old female with generalized pain, weakness.
Recently hospitalized after a fall with T10 compression fracture.

EXAM:
MRI THORACIC SPINE WITHOUT CONTRAST
TECHNIQUE: Multiplanar, multisequence MR imaging of the thoracic spine was
performed. No intravenous contrast was administered.

[Series 26: T1 · sagittal · 3.3mm · 0.62mm/px · 3 of 9 slices shown]
[im 2/9]
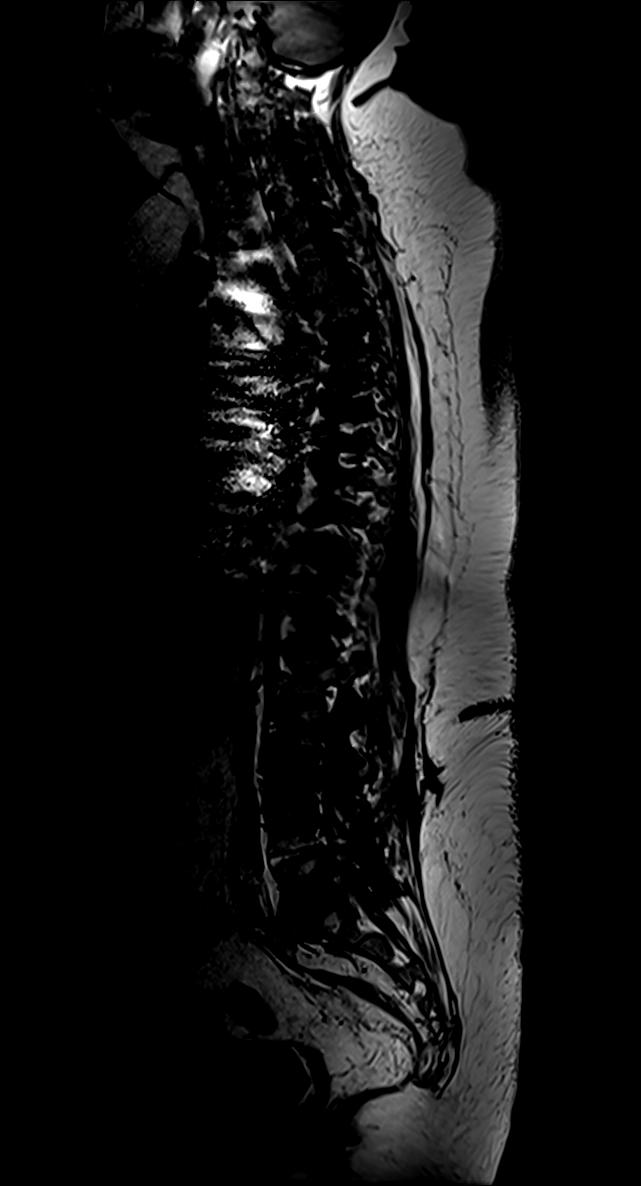
[im 5/9]
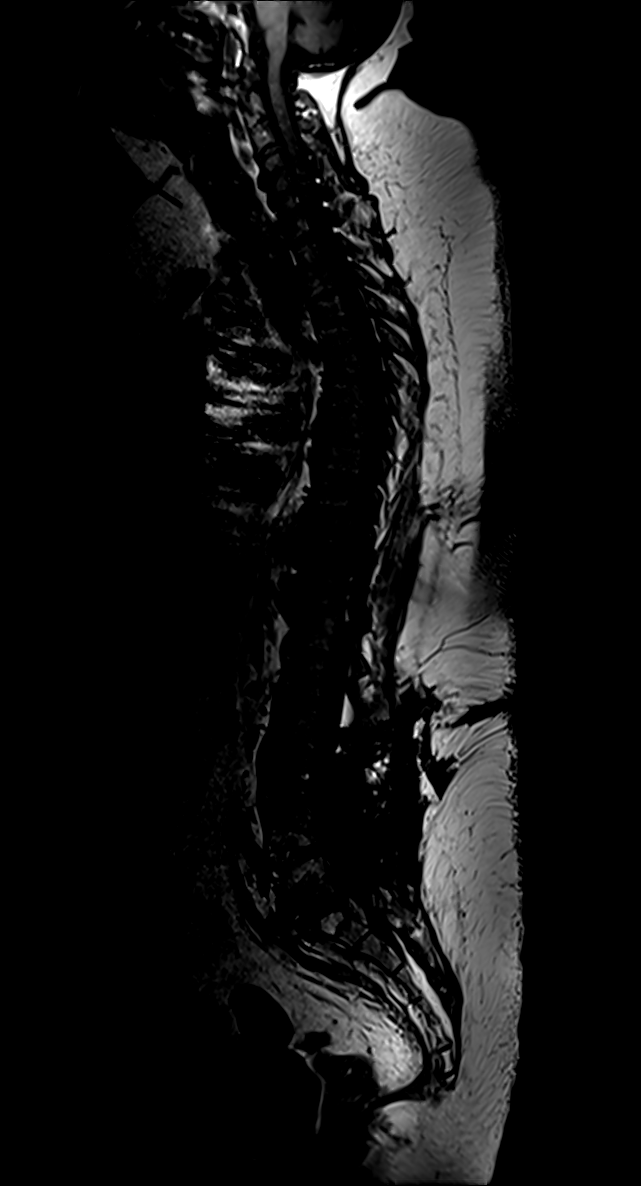
[im 8/9]
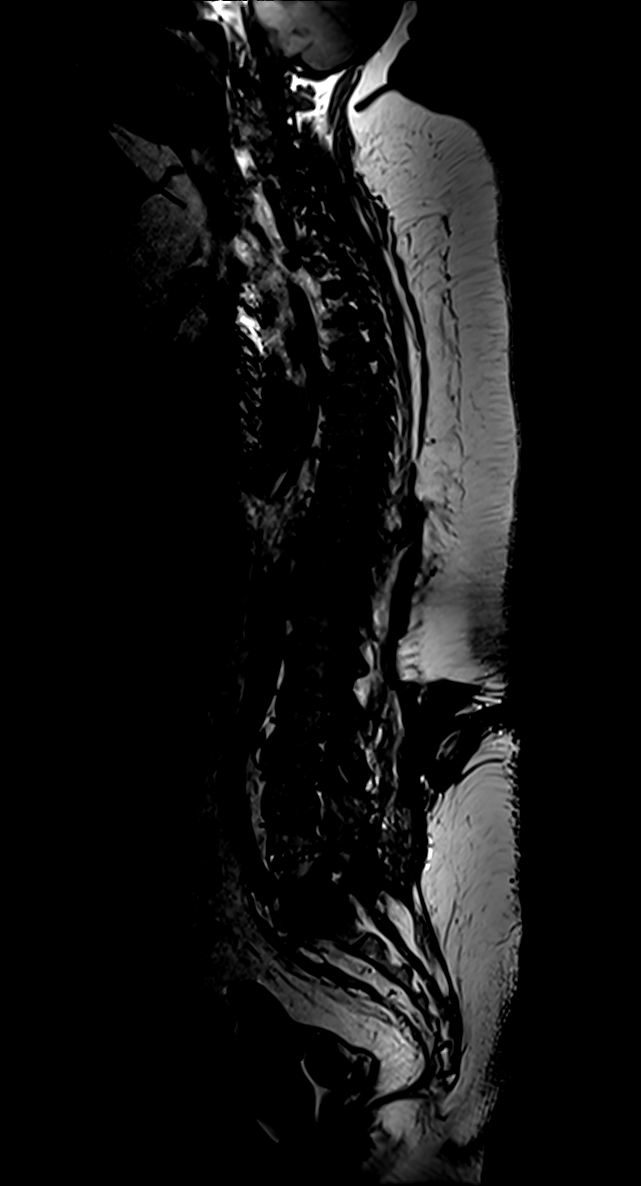

[Series 27: T2 · sagittal · 3.0mm · 0.76mm/px · 11 of 17 slices shown]
[im 2/17]
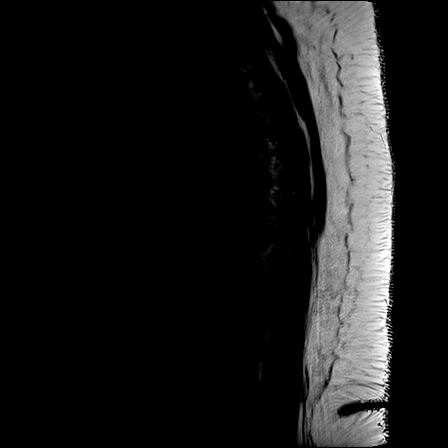
[im 3/17]
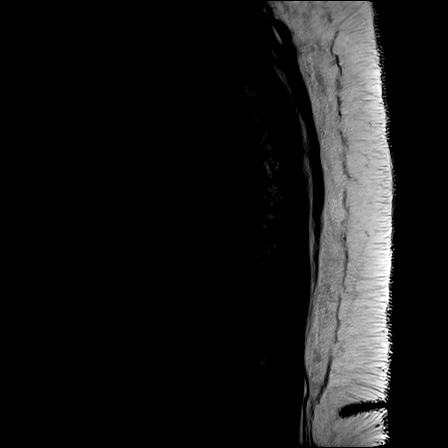
[im 4/17]
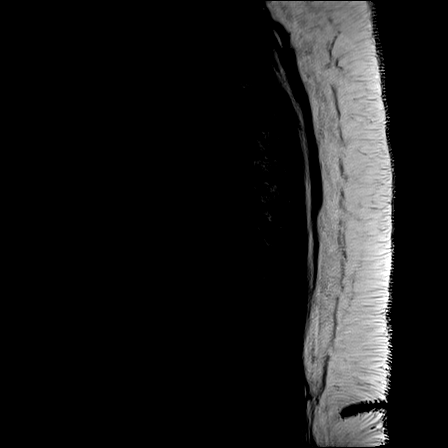
[im 6/17]
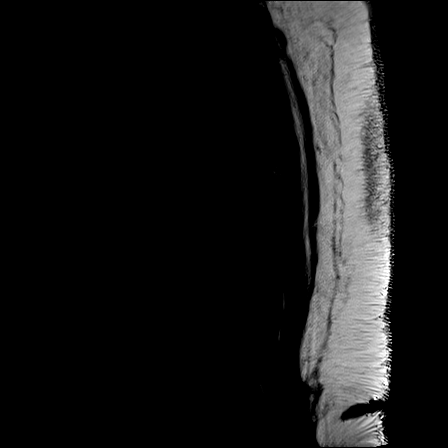
[im 8/17]
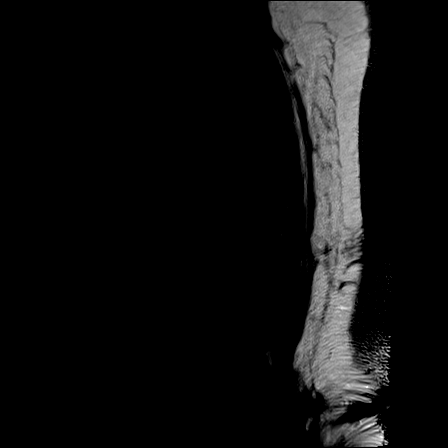
[im 9/17]
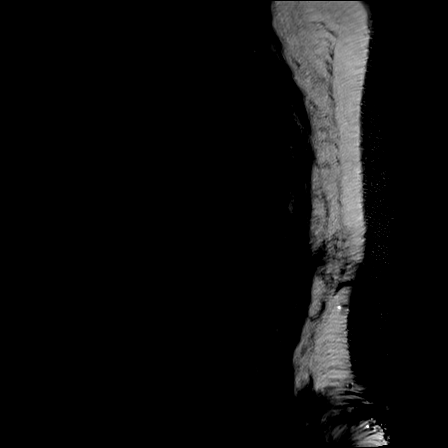
[im 10/17]
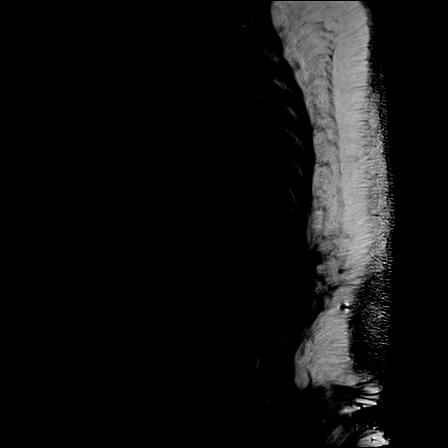
[im 12/17]
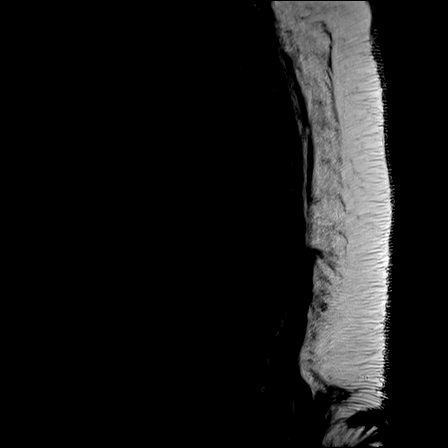
[im 14/17]
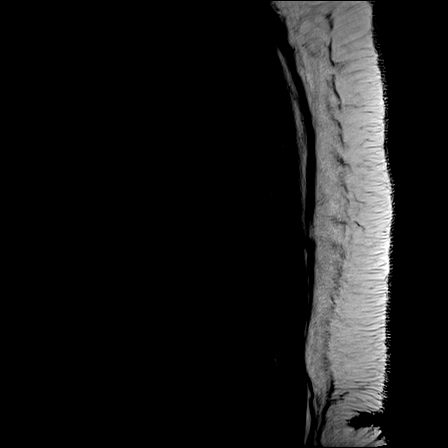
[im 15/17]
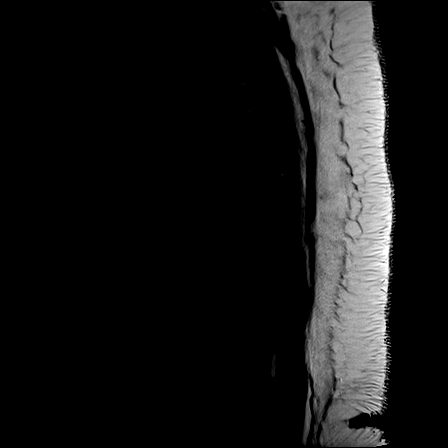
[im 16/17]
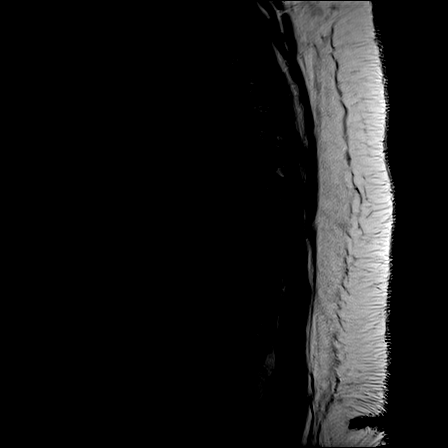

[14 of 26 positions shown; findings below may reference images not displayed]

FINDINGS: The examination had to be discontinued prior to completion due to
pain. Only sagittal T2 weighted imaging, along with a sagittal T1
weighted scout image, could be obtained.

Alignment: Thoracic vertebral alignment appears stable since
06/16/2018. No spondylolisthesis is evident.

Vertebrae: Abnormal appearance of the T10 inferior endplate and
T10-T11 disc space. The anterior inferior T10 endplate was fractured
on the comparison. There is increased T2 signal within the T10-T11
disc space which appears conspicuously widened (series 27, image
10). However, T10 vertebral body height is relatively maintained.
Underlying previous T10-T11 and T11-T12 posterior decompression.

Cord: Limited. No definite thoracic spinal cord signal abnormality.

Paraspinal and other soft tissues: Postoperative changes to the
lower thoracic and lumbar posterior paraspinal soft tissues.

Disc levels:

No definite thoracic spinal stenosis at or above T10-T11. Suspect
residual mild spinal stenosis at T11-T12 where bulky posterior disc
osteophyte complex was noted on the CT last month.
IMPRESSION: 1. Limited exam, only somewhat motion degraded sagittal T2 weighted
imaging could be obtained as the examination was discontinued due to
patient pain.
2. Fracture through the T10 inferior endplate seen in [REDACTED] is
superimposed on previous T10-T11 laminectomy. And there is abnormal
signal in that disc space which appears widened.
This constellation is highly suspicious for an unstable T10-T11
injury through the disc space. Recommend full spine precautions and
Spine Surgery consultation.
3. The T11-T12 level was also previously posteriorly decompressed
and mild residual spinal stenosis is suspected there.

ADDENDUM:
Study discussed by telephone with Dr. Edrus in the ED on
06/29/2018 at 5515 hours.

He advised that the patient has been febrile recently, and so that
rationale for the attempted spine MRI was possible spinal infection.

We discussed that although I do not have enough imaging to exclude a
T10-T11 discitis, I have no suspicion of paraspinal phlegmon to
strongly suggest an infection. And furthermore we know the inferior
T10 level was fracture based on the Dilfredo CT.

We discussed that a complete MRI exam when the patient is better
able to tolerate would be valuable.

*** End of Addendum ***

## 2019-09-29 NOTE — Care Coordination-Inpatient (Signed)
Physical Therapy  Cancellation/No-show Note  Patient Name:  Gabrielle Wells  DOB:  Mar 04, 1951   Date:  09/29/2019  MRN: [id number]  Cancelled visits to date: 0  No-shows to date: 0    For today's appointment patient:  [x]   Cancelled  []   Rescheduled appointment  []   No-show     Reason given by patient:  []   Patient ill  []   Conflicting appointment  []   No transportation    []   Conflict with work  []   No reason given  []   Other:     Comments:  weather  Electronically signed by:  Trula Ore, PT

## 2019-09-30 ENCOUNTER — Inpatient Hospital Stay: Payer: MEDICARE | Primary: Internal Medicine

## 2019-09-30 LAB — CBC WITH AUTO DIFFERENTIAL
Basophils %: 0.8 %
Basophils Absolute: 0 10*3/uL (ref 0.0–0.2)
Eosinophils %: 1.3 %
Eosinophils Absolute: 0.1 10*3/uL (ref 0.0–0.6)
Hematocrit: 43.9 % (ref 36.0–48.0)
Hemoglobin: 14.3 g/dL (ref 12.0–16.0)
Lymphocytes %: 34.8 %
Lymphocytes Absolute: 1.4 K/uL (ref 1.0–5.1)
MCH: 28.2 pg (ref 26.0–34.0)
MCHC: 32.7 g/dL (ref 31.0–36.0)
MCV: 86.3 fL (ref 80.0–100.0)
MPV: 10 fL (ref 5.0–10.5)
Monocytes %: 24 %
Monocytes Absolute: 1 10*3/uL (ref 0.0–1.3)
Neutrophils %: 39.1 %
Neutrophils Absolute: 1.6 10*3/uL — ABNORMAL LOW (ref 1.7–7.7)
Platelets: 145 10*3/uL (ref 135–450)
RBC: 5.08 M/uL (ref 4.00–5.20)
RDW: 15.4 % (ref 12.4–15.4)
WBC: 4.1 10*3/uL (ref 4.0–11.0)

## 2019-09-30 LAB — HEPATITIS C ANTIBODY: Hep C Ab Interp: NONREACTIVE

## 2019-09-30 LAB — C-REACTIVE PROTEIN: CRP: 4.1 mg/L (ref 0.0–5.1)

## 2019-09-30 LAB — SEDIMENTATION RATE: Sed Rate: 19 mm/Hr (ref 0–30)

## 2019-10-04 NOTE — Care Coordination-Inpatient (Signed)
Physical Therapy  Cancellation/No-show Note  Patient Name:  Gabrielle Wells  DOB:  10-Nov-1950   Date:  10/04/2019  MRN: [id number]  Cancelled visits to date: 0  No-shows to date: 0    For today's appointment patient:  [x]   Cancelled  []   Rescheduled appointment  []   No-show     Reason given by patient:  []   Patient ill  []   Conflicting appointment  []   No transportation    []   Conflict with work  []   No reason given  []   Other:     Comments:  weather    Electronically signed by:  Trula Ore, PT

## 2019-10-05 ENCOUNTER — Inpatient Hospital Stay: Payer: MEDICARE | Primary: Internal Medicine

## 2019-10-06 NOTE — Care Coordination-Inpatient (Signed)
Physical Therapy  Cancellation/No-show Note  Patient Name:  Gabrielle Wells  DOB:  [birth year]   Date:  10/06/2019  MRN: [id number]  Cancelled visits to date: 0  No-shows to date: 0    For today's appointment patient:  [x]   Cancelled  []   Rescheduled appointment  []   No-show     Reason given by patient:  []   Patient ill  []   Conflicting appointment  []   No transportation    []   Conflict with work  []   No reason given  []   Other:     Comments:  weather    Electronically signed by:  Trula Ore, PT

## 2019-10-07 ENCOUNTER — Inpatient Hospital Stay: Payer: MEDICARE | Primary: Internal Medicine

## 2019-10-11 ENCOUNTER — Encounter

## 2019-10-11 ENCOUNTER — Inpatient Hospital Stay
Admission: EM | Admit: 2019-10-11 | Discharge: 2019-10-15 | Disposition: A | Discharge status: Home Health | Payer: MEDICARE | Source: Other Acute Inpatient Hospital | Admitting: Emergency Medicine

## 2019-10-11 ENCOUNTER — Inpatient Hospital Stay: Admit: 2019-10-11 | Payer: MEDICARE | Primary: Internal Medicine

## 2019-10-11 ENCOUNTER — Emergency Department: Admit: 2019-10-11 | Payer: MEDICARE | Primary: Internal Medicine

## 2019-10-11 ENCOUNTER — Ambulatory Visit
Admit: 2019-10-11 | Discharge: 2019-10-11 | Payer: MEDICARE | Attending: Student in an Organized Health Care Education/Training Program | Primary: Internal Medicine

## 2019-10-11 DIAGNOSIS — N179 Acute kidney failure, unspecified: Secondary | ICD-10-CM

## 2019-10-11 DIAGNOSIS — R42 Dizziness and giddiness: Secondary | ICD-10-CM

## 2019-10-11 LAB — CBC WITH AUTO DIFFERENTIAL
Basophils %: 2 %
Basophils Absolute: 0.1 10*3/uL (ref 0.0–0.2)
Eosinophils %: 0 %
Eosinophils Absolute: 0 10*3/uL (ref 0.0–0.6)
Hematocrit: 45.3 % (ref 36.0–48.0)
Hemoglobin: 14 g/dL (ref 12.0–16.0)
Lymphocytes %: 35 %
Lymphocytes Absolute: 2.3 10*3/uL (ref 1.0–5.1)
MCH: 27.4 pg (ref 26.0–34.0)
MCHC: 30.9 g/dL — ABNORMAL LOW (ref 31.0–36.0)
MCV: 88.6 fL (ref 80.0–100.0)
MPV: 9.5 fL (ref 5.0–10.5)
Monocytes %: 23 %
Monocytes Absolute: 1.5 10*3/uL — ABNORMAL HIGH (ref 0.0–1.3)
Neutrophils %: 40 %
Neutrophils Absolute: 2.7 10*3/uL (ref 1.7–7.7)
RBC: 5.11 M/uL (ref 4.00–5.20)
RDW: 16.1 % — ABNORMAL HIGH (ref 12.4–15.4)
WBC: 6.7 10*3/uL (ref 4.0–11.0)

## 2019-10-11 LAB — EKG 12-LEAD
Atrial Rate: 55 {beats}/min
P Axis: 15 degrees
P-R Interval: 176 ms
Q-T Interval: 498 ms
QRS Duration: 80 ms
QTc Calculation (Bazett): 476 ms
R Axis: 15 degrees
T Axis: -36 degrees
Ventricular Rate: 55 {beats}/min

## 2019-10-11 LAB — LACTIC ACID: Lactic Acid: 1.9 mmol/L (ref 0.4–2.0)

## 2019-10-11 LAB — BASIC METABOLIC PANEL W/ REFLEX TO MG FOR LOW K
Anion Gap: 14 (ref 3–16)
BUN: 32 mg/dL — ABNORMAL HIGH (ref 7–20)
CO2: 24 mmol/L (ref 21–32)
Calcium: 9.3 mg/dL (ref 8.3–10.6)
Chloride: 99 mmol/L (ref 99–110)
Creatinine: 3.3 mg/dL — ABNORMAL HIGH (ref 0.6–1.2)
GFR African American: 17 — AB (ref >60)
GFR Non-African American: 14 — AB (ref >60)
Glucose: 124 mg/dL — ABNORMAL HIGH (ref 70–99)
Potassium reflex Magnesium: 4.1 mmol/L (ref 3.5–5.1)

## 2019-10-11 LAB — PROTIME-INR
INR: 1.03 (ref 0.86–1.14)
Protime: 12 s (ref 10.0–13.2)

## 2019-10-11 LAB — TROPONIN: Troponin: <0.01 ng/mL (ref <0.01)

## 2019-10-11 LAB — BRAIN NATRIURETIC PEPTIDE: Pro-BNP: 202 pg/mL — ABNORMAL HIGH (ref 0–124)

## 2019-10-11 LAB — APTT: aPTT: 23.5 s — ABNORMAL LOW (ref 24.2–36.2)

## 2019-10-11 LAB — SPECIMEN REJECTION

## 2019-10-11 MED ORDER — ACETAMINOPHEN 325 MG PO TABS
325 MG | Freq: Four times a day (QID) | ORAL | Status: DC | PRN
Start: 2019-10-11 — End: 2019-10-15
  Administered 2019-10-12 – 2019-10-15 (×4): 650 mg via ORAL

## 2019-10-11 MED ORDER — CITALOPRAM HYDROBROMIDE 40 MG PO TABS
40 MG | Freq: Every day | ORAL | Status: DC
Start: 2019-10-11 — End: 2019-10-15
  Administered 2019-10-11 – 2019-10-15 (×5): 40 mg via ORAL

## 2019-10-11 MED ORDER — ONDANSETRON HCL 4 MG/2ML IJ SOLN
4 MG/2ML | Freq: Four times a day (QID) | INTRAMUSCULAR | Status: DC | PRN
Start: 2019-10-11 — End: 2019-10-15
  Administered 2019-10-13 – 2019-10-14 (×2): 4 mg via INTRAVENOUS

## 2019-10-11 MED ORDER — LACTATED RINGERS IV BOLUS
Freq: Once | INTRAVENOUS | Status: AC
Start: 2019-10-11 — End: 2019-10-11
  Administered 2019-10-11: 21:00:00 500 mL via INTRAVENOUS

## 2019-10-11 MED ORDER — VITAMIN D 25 MCG (1000 UT) PO TABS
25 MCG (1000 UT) | Freq: Every day | ORAL | Status: DC
Start: 2019-10-11 — End: 2019-10-15
  Administered 2019-10-11 – 2019-10-15 (×5): 2000 [IU] via ORAL

## 2019-10-11 MED ORDER — NORMAL SALINE FLUSH 0.9 % IV SOLN
0.9 | INTRAVENOUS | Status: DC | PRN
Start: 2019-10-11 — End: 2019-10-15

## 2019-10-11 MED ORDER — LACTATED RINGERS IV BOLUS
Freq: Once | INTRAVENOUS | Status: AC
Start: 2019-10-11 — End: 2019-10-11
  Administered 2019-10-11: 20:00:00 500 mL via INTRAVENOUS

## 2019-10-11 MED ORDER — ACETAMINOPHEN 650 MG RE SUPP
650 MG | Freq: Four times a day (QID) | RECTAL | Status: DC | PRN
Start: 2019-10-11 — End: 2019-10-15

## 2019-10-11 MED ORDER — SODIUM CHLORIDE 0.9 % IV SOLN
0.9 % | INTRAVENOUS | Status: DC
Start: 2019-10-11 — End: 2019-10-12
  Administered 2019-10-11 – 2019-10-12 (×2): via INTRAVENOUS

## 2019-10-11 MED ORDER — NORMAL SALINE FLUSH 0.9 % IV SOLN
0.9 | Freq: Two times a day (BID) | INTRAVENOUS | Status: DC
Start: 2019-10-11 — End: 2019-10-15
  Administered 2019-10-12 – 2019-10-15 (×3): 10 mL via INTRAVENOUS

## 2019-10-11 MED ORDER — GABAPENTIN 300 MG PO CAPS
300 MG | Freq: Two times a day (BID) | ORAL | Status: DC
Start: 2019-10-11 — End: 2019-10-15
  Administered 2019-10-12 – 2019-10-15 (×8): 300 mg via ORAL

## 2019-10-11 MED ORDER — PROMETHAZINE HCL 25 MG PO TABS
25 MG | Freq: Four times a day (QID) | ORAL | Status: DC | PRN
Start: 2019-10-11 — End: 2019-10-15

## 2019-10-11 MED ORDER — HEPARIN SODIUM (PORCINE) 5000 UNIT/ML IJ SOLN
5000 UNIT/ML | Freq: Three times a day (TID) | INTRAMUSCULAR | Status: DC
Start: 2019-10-11 — End: 2019-10-15
  Administered 2019-10-12 – 2019-10-15 (×11): 5000 [IU] via SUBCUTANEOUS

## 2019-10-11 MED ORDER — ONDANSETRON HCL 4 MG/2ML IJ SOLN
4 MG/2ML | Freq: Once | INTRAMUSCULAR | Status: AC
Start: 2019-10-11 — End: 2019-10-11
  Administered 2019-10-11: 20:00:00 4 mg via INTRAVENOUS

## 2019-10-11 MED ORDER — POLYETHYLENE GLYCOL 3350 17 G PO PACK
17 g | Freq: Every day | ORAL | Status: DC | PRN
Start: 2019-10-11 — End: 2019-10-15
  Administered 2019-10-14: 14:00:00 17 g via ORAL

## 2019-10-11 MED ORDER — PANTOPRAZOLE SODIUM 40 MG PO TBEC
40 MG | Freq: Every day | ORAL | Status: DC
Start: 2019-10-11 — End: 2019-10-15
  Administered 2019-10-12 – 2019-10-15 (×4): 40 mg via ORAL

## 2019-10-11 MED ORDER — CLONIDINE HCL 0.1 MG PO TABS
0.1 MG | Freq: Two times a day (BID) | ORAL | Status: DC
Start: 2019-10-11 — End: 2019-10-12

## 2019-10-11 MED ORDER — ASPIRIN 81 MG PO CHEW
81 MG | Freq: Once | ORAL | Status: AC
Start: 2019-10-11 — End: 2019-10-11
  Administered 2019-10-11: 20:00:00 324 mg via ORAL

## 2019-10-11 MED FILL — CITALOPRAM HYDROBROMIDE 40 MG PO TABS: 40 mg | ORAL | Qty: 1

## 2019-10-11 MED FILL — ONDANSETRON HCL 4 MG/2ML IJ SOLN: 4 MG/2ML | INTRAMUSCULAR | Qty: 2

## 2019-10-11 MED FILL — ASPIRIN LOW DOSE 81 MG PO CHEW: 81 mg | ORAL | Qty: 4

## 2019-10-11 MED FILL — VITAMIN D3 25 MCG (1000 UT) PO TABS: 25 MCG (1000 UT) | ORAL | Qty: 2

## 2019-10-11 NOTE — Telephone Encounter (Signed)
Left message all labs normal

## 2019-10-11 NOTE — Progress Notes (Signed)
eval.by Dr.Melvin. Patient's brother instructed to take her to [facility] ED now.

## 2019-10-11 NOTE — Progress Notes (Addendum)
Patient arrived from ED, alert and oriented.  Vitals stable.  IV left forearm, and RAC saline locked.  Denies pain.  Oriented to room, call light, tv, menu, lights, bed.  Reviewed safety and plan of care.  Patient came in with dizziness, so bed alarm put on.  Patient agreeable to call prior to ambulation.  Will monitor.

## 2019-10-11 NOTE — Telephone Encounter (Signed)
-----  Message from Delmar Landau, MD sent at 10/10/2019 11:30 AM EST -----  Please call and inform patient: ESR and CRP wnl. Hep C non-reactive. No anemia.

## 2019-10-11 NOTE — ED Provider Notes (Signed)
ED Attending Attestation Note     Date of evaluation: 10/11/2019    This patient was seen by the resident.  I have seen and examined the patient, agree with the workup, evaluation, management and diagnosis. The care plan has been discussed.  I have reviewed the ECG and concur with the resident's interpretation.  My assessment reveals a 69 year old female who presents with chief complaint of dizziness, nausea.  Patient states she had some back pain this morning which she did not think anything of it because she has a history of back pain, took a nap, and when she woke up she felt nauseous and diaphoretic.  Went to her primary care doctor who sent her here.  Patient on arrival with mild hypotension, EKG with T wave inversions in inferior leads, no prior to compare to.Marland Kitchen  Heart and lung exam difficult due to patient's obesity.       Ottis Stain, MD  10/11/19 [phone]

## 2019-10-11 NOTE — ED Provider Notes (Signed)
[facility]          EM RESIDENT NOTE       Date of evaluation: 10/11/2019    Chief Complaint     Dizziness and Nausea      History of Present Illness     Gabrielle Wells is a 69 y.o. female with a history of epidural abscess, CKD (baseline Cr around 1), congestive heart failure (preserved EF), reflux, hypertension, morbid obesity, and urinary incontinence who presents with generalized weakness and lightheadedness.     The patient presents with generalized fatigue and lightheadedness.  She states that yesterday she felt unwell but was able to ambulate and move around with some shortness of breath on exertion.  She went to her primary office today and they sent her here for evaluation given her generalized weakness, fatigue, and diaphoresis.  She recently moved here from [place] for all of her records are located.  He denies fevers or chills.  She states that when she stands up she feels as though she is in a pass out.  She denies feelings of the room is spinning.  She denies pain at this time.  Yesterday, she did have some left arm pain which she occasionally gets.  She denies chest pain, shortness of breath, abdominal pain, lower extremity swelling.  She is compliant with her medications.    Review of Systems     Review of Systems   Constitutional: Positive for diaphoresis and fatigue. Negative for chills and fever.   HENT: Negative for congestion and sore throat.    Eyes: Negative for photophobia and visual disturbance.   Respiratory: Positive for shortness of breath. Negative for cough and chest tightness.    Cardiovascular: Negative for chest pain, palpitations and leg swelling.   Gastrointestinal: Negative for abdominal pain, diarrhea, nausea and vomiting.   Endocrine: Negative for polyuria.   Genitourinary: Positive for decreased urine volume. Negative for dysuria, flank pain and frequency.   Musculoskeletal: Negative for back pain and neck pain.   Skin:  Negative for pallor, rash and wound.   Neurological: Positive for weakness and light-headedness. Negative for dizziness, syncope and headaches.   Hematological: Negative.    Psychiatric/Behavioral: Negative.        Past Medical, Surgical, Family, and Social History     She has a past medical history of CHF (congestive heart failure) ([place]), Epidural abscess, GERD (gastroesophageal reflux disease), HTN (hypertension), IBS (irritable bowel syndrome), Lumbar radiculopathy, Morbid obesity ([place]), Muscle fasciculation, Neuropathic pain, SBO (small bowel obstruction) ([place]), Spinal fracture of T8 vertebra ([place]), Surgical site infection, and Urinary incontinence.  She has a past surgical history that includes Appendectomy; Breast lumpectomy (Bilateral); Hysterectomy, total abdominal; Cholecystectomy; Total knee arthroplasty (Right); and Cardiac catheterization (2012).  Her family history is not on file.  She reports that she has never smoked. She has never used smokeless tobacco. She reports that she does not drink alcohol or use drugs.    Medications     Previous Medications    CHOLECALCIFEROL (VITAMIN D3) 125 MCG (5000 UT) TABS    Take by mouth    CITALOPRAM (CELEXA) 40 MG TABLET    Take 40 mg by mouth daily    CLONIDINE (CATAPRES) 0.2 MG TABLET    Take 0.2 mg by mouth 2 times daily    DICLOFENAC SODIUM 1 % CREA    Apply 1 Tube topically 2 times daily    FUROSEMIDE (LASIX) 80 MG TABLET  Take 80 mg by mouth daily    GABAPENTIN (NEURONTIN) 300 MG CAPSULE    Take 300 mg by mouth 2 times daily.    METOPROLOL SUCCINATE PO    Take by mouth    OMEPRAZOLE (PRILOSEC) 20 MG DELAYED RELEASE CAPSULE    Take 1 capsule by mouth every morning (before breakfast)    POTASSIUM CHLORIDE (KLOR-CON M) 20 MEQ TBCR EXTENDED RELEASE TABLET    Take 40 mEq by mouth 2 times daily    VALSARTAN (DIOVAN) 80 MG TABLET    Take 1 tablet by mouth daily       Allergies     She is allergic to betadine [povidone iodine]; coconut oil; codeine; fish-derived  products; ibuprofen; iodine; other; and sulfa antibiotics.    Physical Exam     INITIAL VITALS: BP: (!) 88/49,  , Pulse: 59, Resp: 14, SpO2: 96 %   Physical Exam  Constitutional:       Appearance: She is obese.      Comments: Pale and diaphoretic appearing female sitting in bed.  Nontoxic appearing but ill-appearing.   HENT:      Head: Normocephalic and atraumatic.      Nose: No congestion.      Mouth/Throat:      Mouth: Mucous membranes are moist.   Eyes:      Extraocular Movements: Extraocular movements intact.      Pupils: Pupils are equal, round, and reactive to light.   Cardiovascular:      Rate and Rhythm: Regular rhythm. Bradycardia present.      Pulses: Normal pulses.   Pulmonary:      Effort: Pulmonary effort is normal. No respiratory distress.      Breath sounds: Normal breath sounds. No wheezing.   Abdominal:      General: There is distension.      Palpations: Abdomen is soft.      Tenderness: There is no abdominal tenderness. There is no guarding.   Musculoskeletal:         General: No swelling.   Skin:     General: Skin is warm.   Neurological:      General: No focal deficit present.      Mental Status: She is alert and oriented to person, place, and time.      Cranial Nerves: No cranial nerve deficit.      Motor: No weakness.         DiagnosticResults     EKG   Interpreted in conjunction with emergencydepartment physician No att. providers found  Rhythm: sinus bradycardia  Rate: 50-60  Axis: normal  Ectopy: none  Conduction: normal  ST Segments: normal  T Waves:inversion in  v6, II, III and aVf  Q Waves: none  Clinical Impression: non-specific EKG  Comparison:  No prior EKG for comparison.    RADIOLOGY:  XR CHEST PORTABLE   Final Result      Clear lungs.      Normal cardiac mediastinal silhouette      Elevated right hemidiaphragm.      Right shoulder arthroplasty noted.          LABS:   Results for orders placed or performed during the hospital encounter of 10/11/19   CBC Auto Differential   Result  Value Ref Range    WBC 6.7 4.0 - 11.0 K/uL    RBC 5.11 4.00 - 5.20 M/uL    Hemoglobin 14.0 12.0 - 16.0 g/dL    Hematocrit 45.3 36.0 -  48.0 %    MCV 88.6 80.0 - 100.0 fL    MCH 27.4 26.0 - 34.0 pg    MCHC 30.9 (L) 31.0 - 36.0 g/dL    RDW 16.1 (H) 12.4 - 15.4 %    Platelets 137 135 - 450 K/uL    MPV 9.5 5.0 - 10.5 fL    Neutrophils % 40.0 %    Lymphocytes % 35.0 %    Monocytes % 23.0 %    Eosinophils % 0.0 %    Basophils % 2.0 %    Neutrophils Absolute 2.7 1.7 - 7.7 K/uL    Lymphocytes Absolute 2.3 1.0 - 5.1 K/uL    Monocytes Absolute 1.5 (H) 0.0 - 1.3 K/uL    Eosinophils Absolute 0.0 0.0 - 0.6 K/uL    Basophils Absolute 0.1 0.0 - 0.2 K/uL   Basic Metabolic Panel w/ Reflex to MG   Result Value Ref Range    Sodium 137 136 - 145 mmol/L    Potassium reflex Magnesium 4.1 3.5 - 5.1 mmol/L    Chloride 99 99 - 110 mmol/L    CO2 24 21 - 32 mmol/L    Anion Gap 14 3 - 16    Glucose 124 (H) 70 - 99 mg/dL    BUN 32 (H) 7 - 20 mg/dL    CREATININE 3.3 (H) 0.6 - 1.2 mg/dL    GFR Non-African American 14 (A) >60    GFR African American 17 (A) >60    Calcium 9.3 8.3 - 10.6 mg/dL   Troponin   Result Value Ref Range    Troponin <0.01 <0.01 ng/mL   Lactic Acid, Plasma   Result Value Ref Range    Lactic Acid 1.9 0.4 - 2.0 mmol/L   Brain Natriuretic Peptide   Result Value Ref Range    Pro-BNP 202 (H) 0 - 124 pg/mL       ED BEDSIDE ULTRASOUND:  None    RECENT VITALS:  BP: (!) 95/54,  , Pulse: 60,Resp: 16, SpO2: 97 %     Procedures     None    ED Course     Nursing Notes, Past Medical Hx, Past Surgical Hx, Social Hx, Allergies, and Family Hx were reviewed.    The patient was given the followingmedications:  Orders Placed This Encounter   Medications   ??? lactated ringers bolus   ??? aspirin chewable tablet 324 mg   ??? ondansetron (ZOFRAN) injection 4 mg   ??? lactated ringers bolus       CONSULTS:  IP CONSULT TO HOSPITALIST    MEDICAL DECISION MAKING / ASSESSMENT / PLAN     Gabrielle Wells is a 69 y.o. female who presented diaphoretic and pale  appearing.  Initial blood pressure was in the [id number] systolic.  Her initial heart rate was 55.  She denied any pain on evaluation but did say she felt lightheaded even while sitting.  The patient just moved here from [place].  Her review revealed heart failure with a preserved ejection fraction, CK D with a baseline creatinine around 1.  EKG was done and showed T-wave inversions in 23 and aVF with no ST segment elevations.  She also had no Q waves.  Creatinine returned at 3.3 which is elevated from baseline around 1.  The patient also endorses that her urine has been dark.  She is likely volume down resulting in a chaotic.  She denies any dysuria or other urinary symptoms.  After 500 mL of  fluid, she felt much improved and less lightheaded.  Chest x-ray was unremarkable.  BNP was slightly elevated CO2.  She has no other signs of volume overload on exam or on imaging.  Lactic acid was 1.9.  This is reassuring.    Trop was <0.01. I have low concern for ACS.     Given her new kidney injury, she'll be minutes the hospital medicine for further workup and evaluation.    This patient was also evaluated by the attending physician. All care plans werediscussed and agreed upon.    Clinical Impression     1. AKI (acute kidney injury) ([place])        Disposition     PATIENT REFERRED TO:  No follow-up provider specified.    DISCHARGE MEDICATIONS:  New Prescriptions    No medications on file       DISPOSITION  - Admit to HM       Wardell Honour, MD  Resident  10/11/19 [phone]

## 2019-10-11 NOTE — Care Coordination-Inpatient (Signed)
Physical Therapy  Cancellation/No-show Note  Patient Name:  Gabrielle Wells  DOB:  [birth year]   Date:  10/11/2019  MRN: [id number]  Cancelled visits to date: 0  No-shows to date: 0    For today's appointment patient:  []   Cancelled  []   Rescheduled appointment  [x]   No-show     Reason given by patient:  []   Patient ill  []   Conflicting appointment  []   No transportation    []   Conflict with work  []   No reason given  []   Other:     Comments:      Electronically signed by:  Trula Ore, PT

## 2019-10-11 NOTE — ED Notes (Signed)
Report called to [place], [place]  10/11/19 [phone]

## 2019-10-11 NOTE — H&P (Signed)
Internal Medicine  PGY-1   History & Physical      CC: dizziness    HistoryObtained From:  patient    HISTORY OF PRESENT ILLNESS:  Gabrielle Wells is a 69 y.o. female with PMH of CKD, HFpEF, HTN, morbid obesity, and GERD who p/w dizziness.  She started to feel fatigued and unwell yesterday but was able to ambulate and move around with shortness of breath on exertion.  She decided to see her PCP (Dr. Trilby Drummer) at the resident clinic for her symptoms.  At the clinic, she presented with generalized weakness, fatigue, and was diaphoretic.  She states that when he stands up, she gets dizzy and feels as though she is about to pass out.  She has not had a syncopal episode or endorses any vertigo.  She also endorses chronic back pain that seems to have been worsening since yesterday.  She has a history of lumbar pars fracture and back surgery.  She is compliant with her medications. She denies any shortness of breath, abdominal pain, extremity swelling, chest pain, fever, cough, chills or changes in bowel/urinary movements.     ED course: Patient presented diaphoretic and pale.  She was hypotensive in the 80s SBP and bradycardic with HR 55.  Lab workup was significant for elevated Cr 3.3, elevated pro-BNP 202, and negative troponin levels.  EKG revealed T-wave inversions in leads II, III and aVF with no ST segment elevations.  She was given 500 cc LR and shown much improvement of her symptoms.  CXR was negative for findings.    Past Medical History:        Diagnosis Date   ??? CHF (congestive heart failure) ([place])    ??? 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 GERD (gastroesophageal reflux disease)    ??? HTN (hypertension)    ??? IBS (irritable bowel syndrome)    ??? Lumbar radiculopathy    ??? Morbid obesity ([place])    ??? Muscle fasciculation    ??? Neuropathic pain     back 2/2 to spinal surgeries   ??? SBO (small bowel obstruction) ([place]) 07/1999    with partial colectomy   ??? Spinal fracture of T8 vertebra ([facility])      s/p fusion    ??? Surgical site infection     back, spinal hardware    ??? Urinary incontinence        Past Surgical History:        Procedure Laterality Date   ??? APPENDECTOMY     ??? BREAST LUMPECTOMY Bilateral     benign lesions   ??? CARDIAC CATHETERIZATION  2012   ??? CHOLECYSTECTOMY     ??? HYSTERECTOMY, TOTAL ABDOMINAL     ??? TOTAL KNEE ARTHROPLASTY Right        Medications Prior to Admission:    ?? Medications Prior to Admission: metoprolol succinate (TOPROL XL) 25 MG extended release tablet, Take 25 mg by mouth daily   ?? omeprazole (PRILOSEC) 20 MG delayed release capsule, Take 1 capsule by mouth every morning (before breakfast)  ?? valsartan (DIOVAN) 80 MG tablet, Take 1 tablet by mouth daily  ?? cloNIDine (CATAPRES) 0.2 MG tablet, Take 0.2 mg by mouth 2 times daily  ?? gabapentin (NEURONTIN) 300 MG capsule, Take 300 mg by mouth 2 times daily.  ?? vitamin D3 (CHOLECALCIFEROL) 25 MCG (1000 UT) TABS tablet, Take 2,000 Units by mouth daily   ?? citalopram (CELEXA) 40 MG tablet, Take  40 mg by mouth daily  ?? furosemide (LASIX) 80 MG tablet, Take 80 mg by mouth daily  ?? potassium chloride (KLOR-CON M) 20 MEQ TBCR extended release tablet, Take 40 mEq by mouth 2 times daily  ??   Allergies:  Betadine [povidone iodine], Coconut oil, Codeine, Fish-derived products, Ibuprofen, Iodine, Other, and Sulfa antibiotics    Social History:   ?? TOBACCO: reports that she has never smoked. She has never used smokeless tobacco.  ?? ETOH:   reports no history of alcohol use.  ?? DRUGS : no use  ?? Patient currently lives alone    Family History:   ?? History reviewed. No pertinent family history.    Review of Systems: A 10 point review of systems was conducted, significant findings as notedin HPI.    Physical Exam  Constitutional:       Appearance: Normal appearance. She is morbidly obese.   HENT:      Head: Normocephalic and atraumatic.      Mouth/Throat:      Mouth: Mucous membranes are dry.      Pharynx: Oropharynx is clear.   Eyes:       Conjunctiva/sclera: Conjunctivae normal.      Pupils: Pupils are equal, round, and reactive to light.   Neck:      Musculoskeletal: Normal range of motion and neck supple.   Cardiovascular:      Rate and Rhythm: Regular rhythm. Bradycardia present.      Pulses: Normal pulses.      Heart sounds: Normal heart sounds.   Pulmonary:      Effort: Pulmonary effort is normal.      Breath sounds: Normal breath sounds.   Abdominal:      General: Bowel sounds are normal. There is distension.      Palpations: Abdomen is soft.   Musculoskeletal:         General: Tenderness (lumbar spine) present.      Right lower leg: Edema (1+) present.      Left lower leg: Edema (1+) present.   Skin:     General: Skin is warm and dry.      Capillary Refill: Capillary refill takes less than 2 seconds.   Neurological:      General: No focal deficit present.      Mental Status: She is alert and oriented to person, place, and time. Mental status is at baseline.             Vitals:    10/11/19 1731   BP: (!) 152/81   Pulse: 74   Resp: 16   Temp: 97 ??F (36.1 ??C)   SpO2: 96%       DATA:    Labs:  BMP:   Recent Labs     10/11/19  1432   NA 137   K 4.1   CL 99   CO2 24   BUN 32*   CREATININE 3.3*   GLUCOSE 124*     CBC:   Recent Labs     10/11/19  1432   WBC 6.7   HGB 14.0   HCT 45.3   PLT 137       LFT's: No results for input(s): AST, ALT, ALB, BILITOT, ALKPHOS in the last 72 hours.  Troponin:   Recent Labs     10/11/19  1432   TROPONINI <0.01     BNP: No results for input(s): BNP in the last 72 hours.  ABGs: No results for input(s):  PHART, PCO2ART, PO2ART in the last 72 hours.  INR:   Recent Labs     10/11/19  1711   INR 1.03       U/A:No results for input(s): NITRITE, COLORU, PHUR, LABCAST, WBCUA, RBCUA, MUCUS, TRICHOMONAS, YEAST, BACTERIA, CLARITYU, SPECGRAV, LEUKOCYTESUR, UROBILINOGEN, BILIRUBINUR, BLOODU, GLUCOSEU, AMORPHOUS in the last 72 hours.    Invalid input(s): KETONESU    XR CHEST PORTABLE   Final Result      Clear lungs.      Normal  cardiac mediastinal silhouette      Elevated right hemidiaphragm.      Right shoulder arthroplasty noted.      XR LUMBAR SPINE (2-3 VIEWS)    (Results Pending)       ASSESSMENT AND PLAN:  Gabrielle Wells is a 69 y.o. female who presents with dizziness and was admitted for symptomatic bradycardia and AKI.    # Dizziness 2/2 symptomatic bradycardia vs dehydration  Poor po intake; EKG revealed sinus bradycardia w/ T-wave inversions. 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 No follow up lumbar XR s/p I&D.  - XR lumbar spine  - continue home gabapentin    Hypertension - holding home blood pressure medications  GERD - continue protonix  Morbid Obesity: Body mass index is 49.83 kg/m??.. Complicating assessment and treatment. Placing patient at risk for multiple co-morbidities as well as early death and contributing to the patient's presentation. Counseled on diet, weight loss      Code Status: Full Code  FEN:IVF: 125 mL/hr; DIET CARDIAC;  PPX: Heparin  DISPO: GMF      This patient will be discussed with attending, Krystal Clark, MD.    Elmyra Ricks MD, PGY- 1  10/11/2019,  5:57 PM

## 2019-10-11 NOTE — Patient Instructions (Signed)
go to [facility] ED now

## 2019-10-11 NOTE — Telephone Encounter (Signed)
Patient notified her labs and Dexa Scan all normal  She verbalized understanding.

## 2019-10-11 NOTE — Unmapped (Signed)
brYour patient was seen at a [facility]. Please go to [email or web address] to view information filed to your patient's chart in Epic.    If you need to view your patient's results prior to gaining access to Epic CareLink, please contact the [facility] where your patient was seen.              H&P Notes      H&P by Dante Gang, MD at 10/11/2019 5:57 PM  Version 1 of 1    Author: Dante Gang, MD Service: Internal Medicine Author Type: Resident    Filed: 10/11/2019 6:48 PM Date of Service: 10/11/2019 5:57 PM Status: Attested    Editor: Dante Gang, MD (Resident) Cosigner: Adalberto Ill, MD at 10/11/2019 9:19 PM    Attestation signed by Adalberto Ill, MD at 10/11/2019 9:19 PM     Attending Supervising Physician's Attestation Statement    I have personally seen, examined and evaluated the patient. We discussed the plan of care including the assessment and management of the patient. I have reviewed the resident physician's note and agree with the findings and plans as described.      Electronically signed by Adalberto Ill, MD on 10/11/19 at 9:19 PM EST                   Internal Medicine  PGY[VN.1]-1[VN.2]   History & Physical      CC:[VN.1] dizziness[VN.2]    HistoryObtained From:[VN.1]  patient[VN.2]    HISTORY OF PRESENT ILLNESS:  Veronica Winters is a 69 y.o. female with PMH of[VN.1] CKD, HFpEF, HTN, morbid obesity, and GERD[VN.2] who p/w[VN.1] dizziness. She started to feel fatigued and unwell yesterday but was able to ambulate and move around with shortness of breath on   exertion. She decided to see her PCP (Dr. Alinda Money) at the resident clinic for her symptoms. At the clinic, she presented with generalized weakness, fatigue, and was diaphoretic. She states that when he stands up, she gets dizzy and feels as though she is   about to pass out. She has not had a syncopal episode or endorses any vertigo. She also endorses chronic back pain that seems to have been worsening  since yesterday. She has a history of lumbar pars fracture and back surgery. She is compliant with her   medications. She denies any shortness of breath, abdominal pain, extremity swelling, chest pain, fever, cough, chills or changes in bowel/urinary movements.[VN.2]     ED course:[VN.1] Patient presented diaphoretic and pale. She was hypotensive in the 80s SBP and bradycardic with HR 55. Lab workup was significant for elevated Cr 3.3, elevated pro-BNP 202, and negative troponin levels. EKG revealed T-wave inversions in   leads II, III and aVF with no ST segment elevations. She was given 500 cc LR and shown much improvement of her symptoms. CXR was negative for findings.[VN.2]    Past Medical History:       Diagnosis Date   ? CHF (congestive heart failure) (HCC)    ? 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 GERD (gastroesophageal reflux disease)    ? HTN (hypertension)    ? IBS (irritable bowel syndrome)    ? Lumbar radiculopathy    ? Morbid obesity (HCC)    ? Muscle fasciculation    ? Neuropathic pain     back 2/2 to spinal surgeries   ? SBO (small bowel obstruction) (HCC) 07/1999    with partial colectomy   ?  Spinal fracture of T8 vertebra (HCC)     s/p fusion    ? Surgical site infection     back, spinal hardware    ? Urinary incontinence        Past Surgical History:       Procedure Laterality Date   ? APPENDECTOMY     ? BREAST LUMPECTOMY Bilateral     benign lesions   ? CARDIAC CATHETERIZATION  2012   ? CHOLECYSTECTOMY     ? HYSTERECTOMY, TOTAL ABDOMINAL     ? TOTAL KNEE ARTHROPLASTY Right        Medications Prior to Admission:   ?          Medications Prior to Admission: metoprolol succinate (TOPROL XL) 25 MG extended release tablet, Take 25 mg by mouth daily   ?          omeprazole (PRILOSEC) 20 MG delayed release capsule, Take 1 capsule by mouth every morning (before breakfast)  ?          valsartan (DIOVAN) 80 MG tablet, Take 1 tablet by mouth daily  ?          cloNIDine (CATAPRES)  0.2 MG tablet, Take 0.2 mg by mouth 2 times daily  ?          gabapentin (NEURONTIN) 300 MG capsule, Take 300 mg by mouth 2 times daily.  ?          vitamin D3 (CHOLECALCIFEROL) 25 MCG (1000 UT) TABS tablet, Take 2,000 Units by mouth daily   ?          citalopram (CELEXA) 40 MG tablet, Take 40 mg by mouth daily  ?          furosemide (LASIX) 80 MG tablet, Take 80 mg by mouth daily  ?          potassium chloride (KLOR-CON M) 20 MEQ TBCR extended release tablet, Take 40 mEq by mouth 2 times daily  ?            Allergies:  Betadine [povidone iodine], Coconut oil, Codeine, Fish-derived products, Ibuprofen, Iodine, Other, and Sulfa antibiotics    Social History:   ?          TOBACCO: reports that she has never smoked. She has never used smokeless tobacco.  ?          ETOH:   reports no history of alcohol use.  ?          DRUGS :[VN.1] no use[VN.2]  ?          Patient currently lives[VN.1] alone[VN.2]    Family History:   ?          History reviewed. No pertinent family history.    Review of Systems: A 10 point review of systems was conducted, significant findings as notedin HPI.[VN.1]    Physical Exam  Constitutional:    Appearance: Normal appearance. She is morbidly obese.   HENT:    Head: Normocephalic and atraumatic.    Mouth/Throat:    Mouth: Mucous membranes are dry.    Pharynx: Oropharynx is clear.   Eyes:    Conjunctiva/sclera: Conjunctivae normal.    Pupils: Pupils are equal, round, and reactive to light.   Neck:    Musculoskeletal: Normal range of motion and neck supple.   Cardiovascular:    Rate and Rhythm: Regular rhythm. Bradycardia present.    Pulses: Normal pulses.    Heart sounds: Normal heart sounds.   Pulmonary:  Effort: Pulmonary effort is normal.    Breath sounds: Normal breath sounds.   Abdominal:    General: Bowel sounds are normal. There is distension.    Palpations: Abdomen is soft.   Musculoskeletal:    General: Tenderness (lumbar spine) present.    Right lower leg: Edema (1+) present.    Left  lower leg: Edema (1+) present.   Skin:   General: Skin is warm and dry.    Capillary Refill: Capillary refill takes less than 2 seconds.   Neurological:    General: No focal deficit present.    Mental Status: She is alert and oriented to person, place, and time. Mental status is at baseline.[VN.2]                      Vitals:    10/11/19 1731   BP: (!) 152/81   Pulse: 74   Resp: 16   Temp: 97 ?F (36.1 ?C)   SpO2: 96%       DATA:    Labs:  BMP:   Recent Labs     10/11/19  1432   NA 137   K 4.1   CL 99   CO2 24   BUN 32*   CREATININE 3.3*   GLUCOSE 124*     CBC:   Recent Labs     10/11/19  1432   WBC 6.7   HGB 14.0   HCT 45.3   PLT 137      LFT's: No results for input(s): AST, ALT, ALB, BILITOT, ALKPHOS in the last 72 hours.  Troponin:   Recent Labs     10/11/19  1432   TROPONINI <0.01     BNP: No results for input(s): BNP in the last 72 hours.  ABGs: No results for input(s): PHART, PCO2ART, PO2ART in the last 72 hours.  INR:   Recent Labs     10/11/19  1711   INR 1.03       U/A:No results for input(s): NITRITE, COLORU, PHUR, LABCAST, WBCUA, RBCUA, MUCUS, TRICHOMONAS, YEAST, BACTERIA, CLARITYU, SPECGRAV, LEUKOCYTESUR, UROBILINOGEN, BILIRUBINUR, BLOODU, GLUCOSEU, AMORPHOUS in the last 72 hours.    Invalid input(s): KETONESU    XR CHEST PORTABLE   Final Result      Clear lungs.      Normal cardiac mediastinal silhouette      Elevated right hemidiaphragm.      Right shoulder arthroplasty noted.      XR LUMBAR SPINE (2-3 VIEWS) (Results Pending)       ASSESSMENT AND PLAN:  Veronica Winters is a 69 y.o. female who presents with dizziness and was admitted for symptomatic bradycardia and AKI.[VN.1]    # Dizziness 2/2 symptomatic bradycardia vs dehydration  Poor po intake; EKG revealed sinus bradycardia w/ T-wave inversions. 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 No follow up lumbar XR s/p I&D.  - XR lumbar spine  - continue home gabapentin    Hypertension - holding home  blood pressure medications  GERD - continue protonix  Morbid Obesity:[VN.2] Body mass index is 49.83 kg/m?.[VN.3]. Complicating assessment and treatment. Placing patient at risk for multiple co-morbidities as well as early death and contributing to the patient's presentation. Counseled on diet, weight   loss[VN.2]      Code Status: Full Code  FEN:IVF: 125 mL/hr; DIET CARDIAC;  PPX: Heparin  DISPO: GMF      This patient will be discussed with attending, Adalberto Ill, MD.    Dante Gang MD, PGY- 1  10/11/2019, 5:57 PM[VN.1]         Attribution Key     VN.1 - Dante Gang, MD on 10/11/2019 5:57 PM  VN.2 - Dante Gang, MD on 10/11/2019 6:12 PM  VN.3 - Dante Gang, MD on 10/11/2019 6:45 PM                bmk

## 2019-10-11 NOTE — Unmapped (Signed)
[facility] Please go to [email or web address] to view information filed to your patient's chart in Epic.    If you need to view your patient's results prior to gaining access to Epic CareLink, please contact the [facility] where your patient was seen.              CONSULTS      Consults by Cleotis Lema, RPH at 10/11/2019 5:53 PM  Version 1 of 1    Author: Cleotis Lema, [facility] Service: Pharmacy Author Type: Pharmacist    Filed: 10/11/2019 5:53 PM Date of Service: 10/11/2019 5:53 PM Status: Signed    Editor: Cleotis Lema, [facility] (Pharmacist)      Consult Orders      1. Pharmacy to Dose: Other - See Comments: med rec [[id number]] ordered by Dante Gang, MD at 10/11/19 1724               Clinical Pharmacy Progress Note  Medication History     Admit Date: 10/11/19    Asked to verify home medications by Dr. Loma Newton    List of of current medications patient is taking is complete. Home Medication list in EPIC updated to reflect changes noted below.    Source of information: Patient, [employer]    Changes made to medication list:   Medications removed (no longer taking):  ?          Voltaren (diclofenac) gel    Other notes:   ?          Patient reports she has not had any of her medications today, last dose of home medications was yesterday (10/10/19)      Complete Home Medication List:  Current Outpatient Medications on File Prior to Encounter   Medication Sig   ? metoprolol succinate (TOPROL XL) 25 MG extended release tablet Take 25 mg by mouth daily    ? omeprazole (PRILOSEC) 20 MG delayed release capsule Take 1 capsule by mouth every morning (before breakfast)   ? valsartan (DIOVAN) 80 MG tablet Take 1 tablet by mouth daily   ? cloNIDine (CATAPRES) 0.2 MG tablet Take 0.2 mg by mouth 2 times daily   ? gabapentin (NEURONTIN) 300 MG capsule Take 300 mg by mouth 2 times daily.   ? vitamin D3 (CHOLECALCIFEROL) 25 MCG (1000 UT) TABS  tablet Take 2,000 Units by mouth daily    ? citalopram (CELEXA) 40 MG tablet Take 40 mg by mouth daily   ? furosemide (LASIX) 80 MG tablet Take 80 mg by mouth daily   ? potassium chloride (KLOR-CON M) 20 MEQ TBCR extended release tablet Take 40 mEq by mouth 2 times daily       Please call with questions!    Cleotis Lema, PharmD, BCPS  Mobile: [phone]  Main Pharmacy: [phone]]         Attribution Key     CM.1 - Cleotis Lema, [facility] on 10/11/2019 5:53 PM                bmk

## 2019-10-11 NOTE — Unmapped (Signed)
brYour patient was seen at a [facility]. Please go to [email or web address] to view information filed to your patient's chart in Epic.    If you need to view your patient's results prior to gaining access to Epic CareLink, please contact the [facility] where your patient was seen.         Veronica Winters, Veronica Winters #[id number] ([id number]) (69 y.o. F) PCP: Bunnie Domino [phone])        A03-03              ED Arrival Information     Expected Arrival Acuity    - 10/11/2019 1:55 PM 2-Emergent           Means of arrival Escorted by Service Admission type    Walk In [facility] Emergency Medicine Emergency           Arrival complaint    sent per Dr; dizziness; nausea        bmk  Chief Complaint     Complaint Comment    Dizziness     Nausea         ED Vitals    Date/Time Temp Pulse Resp BP SpO2 Weight Who   10/11/19 1500 -- 60 16 95/54 97 % -- EF   10/11/19 1455 -- 55 12 89/56 96 % -- EF   10/11/19 1437 -- 56 16 89/51 91 % -- EF   10/11/19 1430 -- 59 14 88/49 96 % -- EF   10/11/19 1348 96.4 ?F (35.8 ?C) 64 28 120/76 98 % 281 lb 4.8 oz (127.6 kg) BK        bmk  Allergies (Review Complete on: 10/11/19)     Agent Severity Comments    Betadine [Povidone Iodine]      Coconut Oil      Codeine      Fish-derived Products      Ibuprofen      Iodine      Other  detergents    Sulfa Antibiotics          bmk  Medical History     Past Medical History           Date  Comments      CHF (congestive heart failure) (HCC) [I50.9]          HTN (hypertension) [I10]          Neuropathic pain [M79.2]    back 2/2 to spinal surgeries      Lumbar radiculopathy [M54.16]          Spinal fracture of T8 vertebra (HCC) [S22.069A]    s/p fusion       Surgical site infection [T81.49XA]    back, spinal hardware       IBS (irritable bowel syndrome) [K58.9]          GERD (gastroesophageal reflux disease) [K21.9]          SBO (small bowel obstruction) (HCC) [K56.609]  07/1999  with partial  colectomy      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  s/i I&D and removaal of hardware      Muscle fasciculation [R25.3]          Urinary incontinence [R32]          Morbid obesity (HCC) [E66.01]                  Surgical History     Past Surgical History         Laterality  Last Occurrence  Comments    Appendectomy [SHX54]          Breast lumpectomy [SHX2]  Bilateral    benign lesions    Hysterectomy, total abdominal [SHX209]          Cholecystectomy [SHX55]          Total knee arthroplasty [ZOX096]  Right        Cardiac catheterization [SHX172]    2012                Obstetric History    No obstetric history on file.       `  ED Provider Notes     Toma Copier, MD 10/11/2019 3:46 PM                 ED Attending Attestation Note     Date of evaluation: 10/11/2019    This patient was seen by the resident. I have seen and examined the patient, agree with the workup, evaluation, management and diagnosis. The care plan has been discussed. I have reviewed the ECG and concur with the resident's interpretation. My   assessment reveals a 69 year old female who presents with chief complaint of dizziness, nausea. Patient states she had some back pain this morning which she did not think anything of it because she has a history of back pain, took a nap, and when she   woke up she felt nauseous and diaphoretic. Went to her primary care doctor who sent her here. Patient on arrival with mild hypotension, EKG with T wave inversions in inferior leads, no prior to compare to.Marland Kitchen Heart and lung exam difficult due to patient's   obesity.       Toma Copier, MD  10/11/19 1546            ED Medication Orders (From admission, onward)    Start Ordered       Status Ordering Provider    10/11/19 1530 10/11/19 1527  lactated ringers bolus ONCE      Last MAR action: New Bag - by Leighton Parody on 10/11/19 at 1533 LEECH, PAYTON    10/11/19 1430 10/11/19 1423  lactated ringers bolus ONCE      Last MAR action: Stopped - by Leighton Parody  on 10/11/19 at 1532 LEECH, PAYTON    10/11/19 1430 10/11/19 1427  aspirin chewable tablet 324 mg ONCE      Last MAR action: Given - by Leighton Parody on 10/11/19 at 1455 LEECH, PAYTON    10/11/19 1430 10/11/19 1427  ondansetron (ZOFRAN) injection 4 mg ONCE      Last MAR action: Given - by Leighton Parody on 10/11/19 at 1455 LEECH, PAYTON        Lab Results           CBC Auto Differential (Final result)         Collection Time  Result Time  WBC  RBC  HGB  HCT  MCV  MCH  MCHC  RDW  PLT  MPV      10/11/19 14:32:00  10/11/19 15:32:00  6.7  5.11  14.0  45.3  88.6  27.4  30.9  16.1  137  9.5          Previous Results      09/29/19 15:48:00  09/29/19 22:57:00  4.1  5.08  14.3  43.9  86.3  28.2  32.7  15.4  145  10.0      08/04/19 14:17:00  08/04/19 21:47:00  2.9  4.99  14.0  44.1  88.5  28.0  31.6  14.6  138  10.1             Collection Time  Result Time  NEUTRO PCT  LYMPHO PCT  MONO PCT  EOS  BASOS PCT  NEUTRO ABS  LYMPHS ABS  MONOS ABS  EOS ABS  BASOS ABS      10/11/19 14:32:00  10/11/19 15:32:00  40.0  35.0  23.0  0.0  2.0  2.7  2.3  1.5  0.0  0.1          Previous Results      09/29/19 15:48:00  09/29/19 22:57:00  39.1  34.8  24.0  1.3  0.8  1.6  1.4  1.0  0.1  0.0      08/04/19 14:17:00  08/04/19 21:47:00  27.1  44.1  26.8  1.3  0.7  0.8  1.3  0.8  0.0  0.0             Collection Time  Result Time  SLIDE REVIEW  Path Consult      10/11/19 14:32:00  10/11/19 15:32:00              Previous Results      09/29/19 15:48:00  09/29/19 22:57:00          08/04/19 14:17:00  08/04/19 21:47:00  see belowSlide review agrees with reported results  YesSent for Pathology Review                     Final result                        Narrative:      Performed at:  [facility]  [address] [place], [place] [address] Phone [phone]                                     Basic Metabolic Panel w/ Reflex to MG (Final result)         Collection Time  Result  Time  NA  krflxmg  CL  CO2  ANION GAP  GLUCOSE  BUN  CREATININE  GFR Non-African American  GFR African American      10/11/19 14:32:00  10/11/19 15:11:00  137  4.1  99  24  14  124  32  3.3  14  >60 mL/min/1.25m2 EGFR, calc. for ages 57 and older using the  MDRD formula (not corrected for weight), is valid for stable  renal function.    17  Chronic Kidney Disease: less than 60 ml/min/1.73 sq.m.   Kidney Failure: less than 15 ml/min/1.73 sq.m.  Results valid for patients 18 years and older.            Previous Results      08/04/19 14:17:00  08/04/19 20:12:00  139  5.0  101  27  11  97  18  1.2  44>60 mL/min/1.43m2 EGFR, calc. for ages 49 and older using the  MDRD formula (not corrected for weight), is valid for stable  renal function.    54Chronic Kidney Disease: less than 60 ml/min/1.73 sq.m.   Kidney Failure: less than 15 ml/min/1.73 sq.m.  Results valid for patients 18 years and older.  Collection Time  Result Time  CALCIUM      10/11/19 14:32:00  10/11/19 15:11:00  9.3          Previous Results      08/04/19 14:17:00  08/04/19 20:12:00  9.7                     Final result                        Narrative:      Performed at:  [facility]  [address] [place], [place] [address] Phone [phone]                                    Troponin (Final result)         Collection Time  Result Time  Troponin      10/11/19 14:32:00  10/11/19 15:11:00  <0.01  Methodology by Troponin T                     Final result                        Narrative:      Performed at:  [facility]  [address], [place], [place] [address] Phone [phone]                                    Lactic Acid, Plasma (Final result)         Collection Time  Result Time  Lactic Acid      10/11/19 14:32:00  10/11/19 15:05:00  1.9                     Final result                        Narrative:      Performed at:  [facility]  [address] [place], [place] [address] Phone [phone]                                    Protime-INR (In process)   Result time 10/11/19 14:44:36                 APTT (In process)   Result time 10/11/19 14:44:37                  Brain Natriuretic Peptide (Final result)         Collection Time  Result Time  Pro-BNP      10/11/19 14:32:00  10/11/19 15:11:00  202  Methodology by NT-proBNP    An age-independent cutoff point of 300 pg/ml has a 98%  negative predictive value excluding acute heart failure.  Values exceeding the age-related cutoff values (450 pg/mL if  age<50, 900 if 50-75 and 1800 if >75) has 90% sensitivity and  84% specificity for diagnosing acute HF. In patients with  renal compromise (eGFR<60) values greater than 1200pg/ml have  a diagnostic sensitivity and specificity of 89% and 72% for  acute HF.  Final result                        Narrative:      Performed at:  [facility]  [address] [place], [place] [address] Phone [phone]                                 Imaging Results          XR CHEST PORTABLE (Final result)   Result time 10/11/19 15:18:52      Final result by Marcy Siren, MD (10/11/19 15:18:52)                        Impression:        Clear lungs.    Normal cardiac mediastinal silhouette    Elevated right hemidiaphragm.    Right shoulder arthroplasty noted.                  Narrative:      Chest portable 1440    HISTORY: Lightheaded. Left arm pain.                                   ED Administered Medications from 10/11/2019 1356 to 10/11/2019 1546        Date/Time Order Dose Route Action Action by Comments     10/11/2019 1455 aspirin chewable tablet 324 mg 324 mg Oral Given [employer], RN      10/11/2019 1452 lactated ringers bolus 500 mL Intravenous New [address] [place]      10/11/2019 1532 lactated ringers bolus 0 mL Intravenous Stopped Leighton Parody,  RN      10/11/2019 1533 lactated ringers bolus 500 mL Intravenous New [address] [place]      10/11/2019 1455 ondansetron (ZOFRAN) injection 4 mg 4 mg Intravenous Given Leighton Parody, RN         ED Treatment Team     Provider Role From To Phone Pager    Toma Copier, MD Attending Provider 10/11/19 [phone] -- [phone]         Code Onset/Outcome    None     Code Interventions/Drips/Airways    None     bmk  Diagnosis     Diagnosis Comment    AKI (acute kidney injury) (HCC)         bmkbmk  ED Prescriptions     None      bmkbmk  Follow-up Information    None     bmk  Discharge Instructions    None

## 2019-10-11 NOTE — Unmapped (Signed)
brYour patient was seen at a [facility]. Please go to [email or web address] to view information filed to your patient's chart in Epic.    If you need to view your patient's results prior to gaining access to Epic CareLink, please contact the [facility] where your patient was seen.         Veronica Winters, Veronica Winters #[id number] ([id number]) (68 y.o. F) PCP: Bunnie Domino [phone])        A03-03              ED Arrival Information     Expected Arrival Acuity    - 10/11/2019 1:55 PM 2-Emergent           Means of arrival Escorted by Service Admission type    [facility] Member Med/Surg Emergency           Arrival complaint    sent per Dr; dizziness; nausea        bmk  Chief Complaint     Complaint Comment    Dizziness     Nausea         ED Vitals    Date/Time Temp Pulse Resp BP SpO2 Weight Who   10/11/19 1640 -- 65 13 118/63 94 % -- EF   10/11/19 1620 -- 61 15 110/71 95 % -- EF   10/11/19 1600 -- 65 14 114/74 100 % -- EF   10/11/19 1543 -- 62 14 103/69 95 % -- EF   10/11/19 1530 -- 63 15 107/74 93 % -- EF   10/11/19 1500 -- 60 16 95/54 97 % -- EF   10/11/19 1455 -- 55 12 89/56 96 % -- EF   10/11/19 1437 -- 56 16 89/51 91 % -- EF   10/11/19 1430 -- 59 14 88/49 96 % -- EF   10/11/19 1348 96.4 ?F (35.8 ?C) 64 28 120/76 98 % 281 lb 4.8 oz (127.6 kg) BK        bmk  Allergies (Review Complete on: 10/11/19)     Agent Severity Comments    Betadine [Povidone Iodine]      Coconut Oil      Codeine      Fish-derived Products      Ibuprofen      Iodine      Other  detergents    Sulfa Antibiotics          bmk  Medical History     Past Medical History           Date  Comments      CHF (congestive heart failure) (HCC) [I50.9]          HTN (hypertension) [I10]          Neuropathic pain [M79.2]    back 2/2 to spinal surgeries      Lumbar radiculopathy [M54.16]          Spinal fracture of T8 vertebra (HCC) [S22.069A]    s/p fusion       Surgical site infection [T81.49XA]    back, spinal  hardware       IBS (irritable bowel syndrome) [K58.9]          GERD (gastroesophageal reflux disease) [K21.9]          SBO (small bowel obstruction) (HCC) [K56.609]  07/1999  with partial colectomy      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       `  ED Provider Notes     Ethelle Lyon, MD 10/11/2019 3:49 PM                    THE [facility] [place]  EMERGENCY DEPARTMENT ENCOUNTER          EM RESIDENT NOTE       Date of evaluation: 10/11/2019    Chief Complaint     Dizziness and Nausea      History of Present Illness     Veronica Winters is a 69 y.o. female with a history of epidural abscess, CKD (baseline Cr around 1), congestive heart failure (preserved EF), reflux, hypertension, morbid obesity, and urinary incontinence who presents with generalized weakness and   lightheadedness.     The patient presents with generalized fatigue and lightheadedness. She states that yesterday she felt unwell but was able to ambulate and move around with some shortness of breath on exertion. She went to her primary office today and they sent her here   for evaluation given her generalized weakness, fatigue, and diaphoresis. She recently moved here from [place] for all of her records are located. He denies fevers or chills. She states that when she stands up she feels as though she is in a pass   out. She denies feelings of the room is spinning. She denies pain at this  time. Yesterday, she did have some left arm pain which she occasionally gets. She denies chest pain, shortness of breath, abdominal pain, lower extremity swelling. She is compliant   with her medications.    Review of Systems     Review of Systems   Constitutional: Positive for diaphoresis and fatigue. Negative for chills and fever.   HENT: Negative for congestion and sore throat.   Eyes: Negative for photophobia and visual disturbance.   Respiratory: Positive for shortness of breath. Negative for cough and chest tightness.   Cardiovascular: Negative for chest pain, palpitations and leg swelling.   Gastrointestinal: Negative for abdominal pain, diarrhea, nausea and vomiting.   Endocrine: Negative for polyuria.   Genitourinary: Positive for decreased urine volume. Negative for dysuria, flank pain and frequency.   Musculoskeletal: Negative for back pain and neck pain.   Skin: Negative for pallor, rash and wound.   Neurological: Positive for weakness and light-headedness. Negative for dizziness, syncope and headaches.   Hematological: Negative.   Psychiatric/Behavioral: Negative.       Past Medical, Surgical, Family, and Social History     She has a past medical history of CHF (congestive heart failure) (HCC), Epidural  abscess, GERD (gastroesophageal reflux disease), HTN (hypertension), IBS (irritable bowel syndrome), Lumbar radiculopathy, Morbid obesity (HCC), Muscle fasciculation,   Neuropathic pain, SBO (small bowel obstruction) (HCC), Spinal fracture of T8 vertebra (HCC), Surgical site infection, and Urinary incontinence.  She has a past surgical history that includes Appendectomy; Breast lumpectomy (Bilateral); Hysterectomy, total abdominal; Cholecystectomy; Total knee arthroplasty (Right); and Cardiac catheterization (2012).  Her family history is not on file.  She reports that she has never smoked. She has never used smokeless tobacco. She reports that she does not drink alcohol or use  drugs.    Medications     Previous Medications    CHOLECALCIFEROL (VITAMIN D3) 125 MCG (5000 UT) TABS  Take by mouth    CITALOPRAM (CELEXA) 40 MG TABLET  Take 40 mg by mouth daily    CLONIDINE (CATAPRES) 0.2 MG TABLET  Take 0.2 mg by mouth 2 times daily    DICLOFENAC SODIUM 1 % CREA  Apply 1 Tube topically 2 times daily    FUROSEMIDE (LASIX) 80 MG TABLET  Take 80 mg by mouth daily    GABAPENTIN (NEURONTIN) 300 MG CAPSULE  Take 300 mg by mouth 2 times daily.    METOPROLOL SUCCINATE PO  Take by mouth    OMEPRAZOLE (PRILOSEC) 20 MG DELAYED RELEASE CAPSULE  Take 1 capsule by mouth every morning (before breakfast)    POTASSIUM CHLORIDE (KLOR-CON M) 20 MEQ TBCR EXTENDED RELEASE TABLET  Take 40 mEq by mouth 2 times daily    VALSARTAN (DIOVAN) 80 MG TABLET  Take 1 tablet by mouth daily       Allergies     She is allergic to betadine [povidone iodine]; coconut oil; codeine; fish-derived products; ibuprofen; iodine; other; and sulfa antibiotics.    Physical Exam     INITIAL VITALS: BP: (!) 88/49,  , Pulse: 59, Resp: 14, SpO2: 96 %   Physical Exam  Constitutional:    Appearance: She is obese.    Comments: Pale and diaphoretic appearing female sitting in bed. Nontoxic appearing but ill-appearing.   HENT:    Head: Normocephalic and atraumatic.    Nose: No congestion.    Mouth/Throat:    Mouth: Mucous membranes are moist.   Eyes:    Extraocular Movements: Extraocular movements intact.    Pupils: Pupils are equal, round, and reactive to light.   Cardiovascular:    Rate and Rhythm: Regular rhythm. Bradycardia present.    Pulses: Normal pulses.   Pulmonary:    Effort: Pulmonary effort is normal. No respiratory distress.    Breath sounds: Normal breath sounds. No wheezing.   Abdominal:    General: There is distension.    Palpations: Abdomen is soft.    Tenderness: There is no abdominal tenderness. There is no guarding.   Musculoskeletal:    General: No swelling.   Skin:   General: Skin is warm.   Neurological:    General: No focal  deficit present.    Mental Status: She is alert and oriented to person, place, and time.    Cranial Nerves: No cranial nerve deficit.    Motor: No weakness.         DiagnosticResults     EKG   Interpreted in conjunction with emergencydepartment physician No att. providers found  Rhythm: sinus bradycardia  Rate: 50-60  Axis: normal  Ectopy: none  Conduction: normal  ST Segments: normal  T Waves:inversion in v6, II, III and aVf  Q Waves: none  Clinical Impression: non-specific EKG  Comparison: No prior EKG for comparison.    RADIOLOGY:  XR CHEST PORTABLE   Final Result      Clear lungs.      Normal cardiac mediastinal silhouette      Elevated right hemidiaphragm.      Right shoulder arthroplasty noted.          LABS:   Results for orders placed or performed during the hospital encounter of 10/11/19   CBC Auto Differential   Result Value Ref Range    WBC 6.7 4.0 - 11.0 K/uL    RBC 5.11 4.00 - 5.20 M/uL    Hemoglobin 14.0 12.0 - 16.0 g/dL    Hematocrit 16.1 09.6 - 48.0 %    MCV 88.6 80.0 - 100.0 fL    MCH 27.4 26.0 - 34.0 pg    MCHC 30.9 (L) 31.0 - 36.0 g/dL    RDW 04.5 (H) 40.9 - 15.4 %    Platelets 137 135 - 450 K/uL    MPV 9.5 5.0 - 10.5 fL    Neutrophils % 40.0 %    Lymphocytes % 35.0 %    Monocytes % 23.0 %    Eosinophils % 0.0 %    Basophils % 2.0 %    Neutrophils Absolute 2.7 1.7 - 7.7 K/uL    Lymphocytes Absolute 2.3 1.0 - 5.1 K/uL    Monocytes Absolute 1.5 (H) 0.0 - 1.3 K/uL    Eosinophils Absolute 0.0 0.0 - 0.6 K/uL    Basophils Absolute 0.1 0.0 - 0.2 K/uL   Basic Metabolic Panel w/ Reflex to MG   Result Value Ref Range    Sodium 137 136 - 145 mmol/L    Potassium reflex Magnesium 4.1 3.5 - 5.1 mmol/L    Chloride 99 99 - 110 mmol/L    CO2 24 21 - 32 mmol/L    Anion Gap 14 3 - 16    Glucose 124 (H) 70 - 99 mg/dL    BUN 32 (H) 7 - 20 mg/dL    CREATININE 3.3 (H) 0.6 - 1.2 mg/dL    GFR Non-African American 14 (A) >60    GFR African American 17 (A) >60    Calcium 9.3 8.3 - 10.6 mg/dL   Troponin   Result Value Ref  Range    Troponin <0.01 <0.01 ng/mL   Lactic Acid, Plasma   Result Value Ref Range    Lactic Acid 1.9 0.4 - 2.0 mmol/L   Brain Natriuretic Peptide   Result Value Ref Range    Pro-BNP 202 (H) 0 - 124 pg/mL       ED BEDSIDE ULTRASOUND:  None    RECENT VITALS: BP: (!) 95/54,  , Pulse: 60,Resp: 16, SpO2: 97 %     Procedures     None    ED Course     Nursing Notes, Past Medical Hx, Past Surgical Hx, Social Hx, Allergies, and Family Hx were reviewed.    The patient was given the followingmedications:  Orders Placed This Encounter   Medications   ? lactated ringers bolus   ? aspirin chewable tablet 324 mg   ? ondansetron (ZOFRAN) injection 4 mg   ? lactated ringers bolus       CONSULTS:  IP CONSULT TO HOSPITALIST    MEDICAL DECISION MAKING / ASSESSMENT / PLAN     Veronica Winters is a 69 y.o. female who presented diaphoretic and pale appearing. Initial blood pressure was in the 80s systolic. Her initial heart  rate was 55. She denied any pain on evaluation but did say she felt lightheaded even while sitting. The   patient just moved here from [place]. Her review revealed heart failure with a preserved ejection fraction, CK D with a baseline creatinine around 1. EKG was done and showed T-wave inversions in 23 and aVF with no ST segment elevations. She also had no   Q waves. Creatinine returned at 3.3 which is elevated from baseline around 1. The patient also endorses that her urine has been dark. She is likely volume down resulting in a chaotic. She denies any dysuria or other urinary symptoms. After 500 mL of   fluid, she felt much improved and less lightheaded. Chest x-ray was unremarkable. BNP was slightly elevated CO2. She has no other signs of volume overload on exam or on imaging. Lactic acid was 1.9. This is reassuring.    Trop was <0.01. I have low concern for ACS.     Given her new kidney injury, she'll be minutes the hospital medicine for further workup and evaluation.    This patient was also evaluated by the  attending physician. All care plans werediscussed and agreed upon.    Clinical Impression     1. AKI (acute kidney injury) (HCC)        Disposition     PATIENT REFERRED TO:  No follow-up provider specified.    DISCHARGE MEDICATIONS:  New Prescriptions    No medications on file       DISPOSITION - Admit to HM       Ethelle Lyon, MD  Resident  10/11/19 1549         Cosigned by Toma Copier, MD at 10/11/2019 4:55 PM            Toma Copier, MD 10/11/2019 3:46 PM                 ED Attending Attestation Note     Date of evaluation: 10/11/2019    This patient was seen by the resident. I have seen and examined the patient, agree with the workup, evaluation, management and diagnosis. The care plan has been discussed. I have reviewed the ECG and concur with the resident's interpretation. My   assessment reveals a 69 year old female who presents with chief complaint of dizziness, nausea. Patient states she had some back pain this morning which she did not think anything of it because she has a history of back pain, took a nap, and when she   woke up she felt nauseous and diaphoretic. Went to her primary care doctor who sent her here. Patient on arrival with mild hypotension, EKG with T wave inversions in inferior leads, no prior to compare to.Marland Kitchen Heart and lung exam difficult due to patient's   obesity.       Toma Copier, MD  10/11/19 1546            ED Medication Orders (From admission, onward)    Start Ordered       Status Ordering Provider    10/11/19 1530 10/11/19 1527  lactated ringers bolus ONCE      Last MAR action: Stopped - by Leighton Parody on 10/11/19 at 1603 LEECH, PAYTON    10/11/19 1430 10/11/19 1423  lactated ringers bolus ONCE      Last MAR action: Stopped - by Leighton Parody on 10/11/19 at 1532 LEECH, PAYTON    10/11/19 1430 10/11/19 1427  aspirin chewable tablet 324 mg ONCE  Last MAR action: Given - by Leighton Parody on 10/11/19 at 1455 LEECH, PAYTON    10/11/19 1430 10/11/19  1427  ondansetron (ZOFRAN) injection 4 mg ONCE      Last MAR action: Given - by Leighton Parody on 10/11/19 at 1455 LEECH, PAYTON        Lab Results           CBC Auto Differential (Final result)         Collection Time  Result Time  WBC  RBC  HGB  HCT  MCV  MCH  MCHC  RDW  PLT  MPV      10/11/19 14:32:00  10/11/19 15:32:00  6.7  5.11  14.0  45.3  88.6  27.4  30.9  16.1  137  9.5          Previous Results      09/29/19 15:48:00  09/29/19 22:57:00  4.1  5.08  14.3  43.9  86.3  28.2  32.7  15.4  145  10.0      08/04/19 14:17:00  08/04/19 21:47:00  2.9  4.99  14.0  44.1  88.5  28.0  31.6  14.6  138  10.1             Collection Time  Result Time  NEUTRO PCT  LYMPHO PCT  MONO PCT  EOS  BASOS PCT  NEUTRO ABS  LYMPHS ABS  MONOS ABS  EOS ABS  BASOS ABS      10/11/19 14:32:00  10/11/19 15:32:00  40.0  35.0  23.0  0.0  2.0  2.7  2.3  1.5  0.0  0.1          Previous Results      09/29/19 15:48:00  09/29/19 22:57:00  39.1  34.8  24.0  1.3  0.8  1.6  1.4  1.0  0.1  0.0      08/04/19 14:17:00  08/04/19 21:47:00  27.1  44.1  26.8  1.3  0.7  0.8  1.3  0.8  0.0  0.0             Collection Time  Result Time  SLIDE REVIEW  Path Consult      10/11/19 14:32:00  10/11/19 15:32:00              Previous Results      09/29/19 15:48:00  09/29/19 22:57:00          08/04/19 14:17:00  08/04/19 21:47:00  see belowSlide review agrees with reported results  YesSent for Pathology Review                     Final result                        Narrative:      Performed at:  [facility]  [address] [place], [place] [address] Phone [phone]                                     Basic Metabolic Panel w/ Reflex to MG (Final result)         Collection Time  Result Time  NA  krflxmg  CL  CO2  ANION GAP  GLUCOSE  BUN  CREATININE  GFR Non-African American  GFR African American      10/11/19 14:32:00  10/11/19 15:11:00  137  4.1  99  24  14  124  32  3.3  14  >60 mL/min/1.26m2 EGFR, calc. for ages 30 and  older using the  MDRD formula (not corrected for weight), is valid for stable  renal function.    17  Chronic Kidney Disease: less than 60 ml/min/1.73 sq.m.   Kidney Failure: less than 15 ml/min/1.73 sq.m.  Results valid for patients 18 years and older.            Previous Results      08/04/19 14:17:00  08/04/19 20:12:00  139  5.0  101  27  11  97  18  1.2  44>60 mL/min/1.44m2 EGFR, calc. for ages 85 and older using the  MDRD formula (not corrected for weight), is valid for stable  renal function.    54Chronic Kidney Disease: less than 60 ml/min/1.73 sq.m.   Kidney Failure: less than 15 ml/min/1.73 sq.m.  Results valid for patients 18 years and older.               Collection Time  Result Time  CALCIUM      10/11/19 14:32:00  10/11/19 15:11:00  9.3          Previous Results      08/04/19 14:17:00  08/04/19 20:12:00  9.7                     Final result                        Narrative:      Performed at:  [facility]  [address] [place], [place] [address] Phone [phone]                                    Troponin (Final result)         Collection Time  Result Time  Troponin      10/11/19 14:32:00  10/11/19 15:11:00  <0.01  Methodology by Troponin T                     Final result                        Narrative:      Performed at:  [facility]  [address], [place], [place] [address] Phone [phone]                                    Lactic Acid, Plasma (Final result)         Collection Time  Result Time  Lactic Acid      10/11/19 14:32:00  10/11/19 15:05:00  1.9                     Final result                        Narrative:      Performed at:  [facility]  [address] [place], [place] [address] Phone [phone]  Protime-INR (In process)   Result time 10/11/19 14:44:36                 APTT (In process)   Result time 10/11/19  14:44:37                  Brain Natriuretic Peptide (Final result)         Collection Time  Result Time  Pro-BNP      10/11/19 14:32:00  10/11/19 15:11:00  202  Methodology by NT-proBNP    An age-independent cutoff point of 300 pg/ml has a 98%  negative predictive value excluding acute heart failure.  Values exceeding the age-related cutoff values (450 pg/mL if  age<50, 900 if 50-75 and 1800 if >75) has 90% sensitivity and  84% specificity for diagnosing acute HF. In patients with  renal compromise (eGFR<60) values greater than 1200pg/ml have  a diagnostic sensitivity and specificity of 89% and 72% for  acute HF.                       Final result                        Narrative:      Performed at:  [facility]  [address], [place], [place] [address] Phone [phone]                                 Imaging Results          XR CHEST PORTABLE (Final result)   Result time 10/11/19 15:18:52      Final result by Marcy Siren, MD (10/11/19 15:18:52)                        Impression:        Clear lungs.    Normal cardiac mediastinal silhouette    Elevated right hemidiaphragm.    Right shoulder arthroplasty noted.                  Narrative:      Chest portable 1440    HISTORY: Lightheaded. Left arm pain.                                   ED Administered Medications from 10/11/2019 1356 to 10/11/2019 1655        Date/Time Order Dose Route Action Action by Comments     10/11/2019 1455 aspirin chewable tablet 324 mg 324 mg Oral Given Leighton Parody, RN      10/11/2019 1452 lactated ringers bolus 500 mL Intravenous New [address] [place]      10/11/2019 1532 lactated ringers bolus 0 mL Intravenous Stopped Leighton Parody, RN      10/11/2019 1533 lactated ringers bolus 500 mL Intravenous New [address], [place]      10/11/2019 1603 lactated ringers bolus 0 mL Intravenous Stopped Leighton Parody, RN      10/11/2019 1455 ondansetron (ZOFRAN)  injection 4 mg 4 mg Intravenous Given Leighton Parody, RN         ED Treatment Team     Provider Role From To Phone Pager    Toma Copier, MD Attending Provider 10/11/19 [phone] -- [phone]  Code Onset/Outcome    None     Code Interventions/Drips/Airways    None     bmk  Diagnosis     Diagnosis Comment    AKI (acute kidney injury) (HCC)         bmkbmk  ED Prescriptions     None      bmkbmk  Follow-up Information    None     bmk  Discharge Instructions    None

## 2019-10-11 NOTE — Consults (Signed)
Clinical Pharmacy Progress Note  Medication History     Admit Date: 10/11/19    Asked to verify home medications by Dr. Raelene Bott    List of of current medications patient is taking is complete. Home Medication list in EPIC updated to reflect changes noted below.    Source of information: Patient, [employer]    Changes made to medication list:   Medications removed (no longer taking):  Voltaren (diclofenac) gel    Other notes:   Patient reports she has not had any of her medications today, last dose of home medications was yesterday (10/10/19)      Complete Home Medication List:  Current Outpatient Medications on File Prior to Encounter   Medication Sig    metoprolol succinate (TOPROL XL) 25 MG extended release tablet Take 25 mg by mouth daily     omeprazole (PRILOSEC) 20 MG delayed release capsule Take 1 capsule by mouth every morning (before breakfast)    valsartan (DIOVAN) 80 MG tablet Take 1 tablet by mouth daily    cloNIDine (CATAPRES) 0.2 MG tablet Take 0.2 mg by mouth 2 times daily    gabapentin (NEURONTIN) 300 MG capsule Take 300 mg by mouth 2 times daily.    vitamin D3 (CHOLECALCIFEROL) 25 MCG (1000 UT) TABS tablet Take 2,000 Units by mouth daily     citalopram (CELEXA) 40 MG tablet Take 40 mg by mouth daily    furosemide (LASIX) 80 MG tablet Take 80 mg by mouth daily    potassium chloride (KLOR-CON M) 20 MEQ TBCR extended release tablet Take 40 mEq by mouth 2 times daily       Please call with questions!    Hedy Jacob, PharmD, BCPS  Mobile: [phone]  Main Pharmacy: [phone]

## 2019-10-11 NOTE — Telephone Encounter (Signed)
-----   Message from Delmar Landau, MD sent at 10/10/2019 11:31 AM EST -----  Please call and inform patient: DEXA scan within normal range. No osteopenia or osteoporosis.

## 2019-10-12 ENCOUNTER — Inpatient Hospital Stay: Admit: 2019-10-12 | Payer: MEDICARE | Primary: Internal Medicine

## 2019-10-12 ENCOUNTER — Inpatient Hospital Stay: Payer: MEDICARE | Primary: Internal Medicine

## 2019-10-12 LAB — BASIC METABOLIC PANEL W/ REFLEX TO MG FOR LOW K
Anion Gap: 12 (ref 3–16)
BUN: 36 mg/dL — ABNORMAL HIGH (ref 7–20)
Calcium: 8.7 mg/dL (ref 8.3–10.6)
Chloride: 102 mmol/L (ref 99–110)
Creatinine: 3.2 mg/dL — ABNORMAL HIGH (ref 0.6–1.2)
GFR African American: 17 — AB (ref >60)
GFR Non-African American: 14 — AB (ref >60)
Glucose: 88 mg/dL (ref 70–99)
Sodium: 137 mmol/L (ref 136–145)

## 2019-10-12 LAB — CBC WITH AUTO DIFFERENTIAL
Basophils %: 0.5 %
Basophils Absolute: 0 10*3/uL (ref 0.0–0.2)
Eosinophils %: 0.8 %
Hematocrit: 38.8 % (ref 36.0–48.0)
Hemoglobin: 12.4 g/dL (ref 12.0–16.0)
Lymphocytes %: 37.4 %
Lymphocytes Absolute: 1.9 10*3/uL (ref 1.0–5.1)
MCH: 27.8 pg (ref 26.0–34.0)
MCHC: 31.9 g/dL (ref 31.0–36.0)
MPV: 9.5 fL (ref 5.0–10.5)
Monocytes %: 24.6 %
Monocytes Absolute: 1.3 10*3/uL (ref 0.0–1.3)
Neutrophils %: 36.7 %
Neutrophils Absolute: 1.9 10*3/uL (ref 1.7–7.7)
Platelets: 112 K/uL — ABNORMAL LOW (ref 135–450)
RBC: 4.45 M/uL (ref 4.00–5.20)
RDW: 15.8 % — ABNORMAL HIGH (ref 12.4–15.4)
WBC: 5.2 10*3/uL (ref 4.0–11.0)

## 2019-10-12 LAB — URINALYSIS WITH REFLEX TO CULTURE
Bilirubin Urine: NEGATIVE
Blood, Urine: NEGATIVE
Glucose, Ur: NEGATIVE mg/dL
Ketones, Urine: NEGATIVE mg/dL
Nitrite, Urine: NEGATIVE
Specific Gravity, UA: >=1.03 (ref 1.005–1.030)
Urobilinogen, Urine: 0.2 E.U./dL (ref <2.0)
pH, UA: 6 (ref 5.0–8.0)

## 2019-10-12 LAB — RENAL FUNCTION PANEL
Albumin: 3.3 g/dL — ABNORMAL LOW (ref 3.4–5.0)
Anion Gap: 7 (ref 3–16)
BUN: 28 mg/dL — ABNORMAL HIGH (ref 7–20)
Calcium: 7.9 mg/dL — ABNORMAL LOW (ref 8.3–10.6)
Chloride: 107 mmol/L (ref 99–110)
Creatinine: 1.9 mg/dL — ABNORMAL HIGH (ref 0.6–1.2)
GFR African American: 32 — AB (ref >60)
GFR Non-African American: 26 — AB (ref >60)
Glucose: 111 mg/dL — ABNORMAL HIGH (ref 70–99)
Phosphorus: 3 mg/dL (ref 2.5–4.9)
Potassium: 3.9 mmol/L (ref 3.5–5.1)
Sodium: 138 mmol/L (ref 136–145)

## 2019-10-12 LAB — MICROSCOPIC URINALYSIS

## 2019-10-12 LAB — OSMOLALITY: Osmolality: 310 mOsm/kg — ABNORMAL HIGH (ref 278–305)

## 2019-10-12 LAB — SODIUM, URINE, RANDOM: Sodium, Ur: 67 mmol/L

## 2019-10-12 LAB — ECHOCARDIOGRAM LIMITED: Left Ventricular Ejection Fraction: 63

## 2019-10-12 LAB — OSMOLALITY, URINE: Osmolality, Ur: 600 mOsm/kg (ref 390–1070)

## 2019-10-12 MED ORDER — SODIUM CHLORIDE 0.9 % IV BOLUS
0.9 % | Freq: Once | INTRAVENOUS | Status: AC
Start: 2019-10-12 — End: 2019-10-12
  Administered 2019-10-12: 09:00:00 1000 mL via INTRAVENOUS

## 2019-10-12 MED ORDER — SODIUM CHLORIDE 0.9 % IV BOLUS
0.9 % | Freq: Once | INTRAVENOUS | Status: DC
Start: 2019-10-12 — End: 2019-10-12

## 2019-10-12 MED ORDER — PERFLUTREN LIPID MICROSPHERE IV SUSP
Freq: Once | INTRAVENOUS | Status: AC | PRN
Start: 2019-10-12 — End: 2019-10-12
  Administered 2019-10-12: 21:00:00 1.65 mL via INTRAVENOUS

## 2019-10-12 MED ORDER — INFLUENZA VAC SPLIT QUAD 0.5 ML IM SUSY
0.5 ML | INTRAMUSCULAR | Status: AC
Start: 2019-10-12 — End: 2019-10-15
  Administered 2019-10-15: 20:00:00 0.5 mL via INTRAMUSCULAR

## 2019-10-12 MED ORDER — SODIUM CHLORIDE 0.9 % IV SOLN
0.9 % | INTRAVENOUS | Status: DC
Start: 2019-10-12 — End: 2019-10-14
  Administered 2019-10-12 – 2019-10-13 (×3): via INTRAVENOUS

## 2019-10-12 MED FILL — CITALOPRAM HYDROBROMIDE 40 MG PO TABS: 40 mg | ORAL | Qty: 1

## 2019-10-12 MED FILL — PANTOPRAZOLE SODIUM 40 MG PO TBEC: 40 mg | ORAL | Qty: 1

## 2019-10-12 MED FILL — HEPARIN SODIUM (PORCINE) 5000 UNIT/ML IJ SOLN: 5000 [IU]/mL | INTRAMUSCULAR | Qty: 1

## 2019-10-12 MED FILL — GABAPENTIN 300 MG PO CAPS: 300 mg | ORAL | Qty: 1

## 2019-10-12 MED FILL — ACETAMINOPHEN 325 MG PO TABS: 325 mg | ORAL | Qty: 2

## 2019-10-12 MED FILL — VITAMIN D3 25 MCG (1000 UT) PO TABS: 25 MCG (1000 UT) | ORAL | Qty: 2

## 2019-10-12 NOTE — Care Coordination-Inpatient (Addendum)
Cm following, Pt from home with family( brother), pt creat up 3.2 FC placed for retention after bladder scan.   Pt will need PT OT evals for Dc recs, pt plans to return home once medically stable open to [facility] if needed.     Electronically signed by Vincent Peyer, RN on 10/12/2019 at 11:35 AM  [phone]    UPDATE:  Spoke with niece [phone] she indicated that pt may need SNF would like [facility] [place] [facility].  Advised that PT OT evals would need to be done, then we can talk about SNF vs HHC, she is in agreement.  Also gave Addison Lank as contact as well [phone].  Electronically signed by Vincent Peyer, RN on 10/12/2019 at 11:56 AM  [phone]

## 2019-10-12 NOTE — Progress Notes (Signed)
Progress Note  Admit Date: 10/11/2019         CC: F/U for bradycardia, arf    Subjective:  Pt having difficulty urinating this am.  PVR done showing > 300 ml and now pt s/p foley.       Diet: DIET CARDIAC;    PHYSICAL EXAM:  BP 114/79    Pulse 64    Temp 97.7 ??F (36.5 ??C) (Oral)    Resp 17    Ht 5\' 3"  (1.6 m)    Wt 281 lb 4.9 oz (127.6 kg)    SpO2 99%    BMI 49.83 kg/m??   No results for input(s): POCGLU in the last 72 hours.    Intake/Output Summary (Last 24 hours) at 10/12/2019 [id number]  Last data filed at 10/11/2019 2323  Gross per 24 hour   Intake 779 ml   Output 200 ml   Net 579 ml     General appearance: alert, appears stated age and cooperative  Head: Normocephalic, without obvious abnormality, atraumatic  Lungs: clear to auscultation bilaterally  Heart: regular rate and rhythm  Abdomen: normal findings: soft, non-tender  Extremities: extremities normal, atraumatic, no cyanosis or edema  Neurologic: Grossly normal    LABS:  Recent Labs     10/11/19  1432 10/12/19  0445   WBC 6.7 5.2   HGB 14.0 12.4   HCT 45.3 38.8   PLT 137 112*                                                                  Recent Labs     10/11/19  1432 10/12/19  0445   NA 137 137   K 4.1 4.0   CL 99 102   CO2 24 23   BUN 32* 36*   CREATININE 3.3* 3.2*   GLUCOSE 124* 88     Recent Labs     10/11/19  1711   INR 1.03       Assessment & Plan:      ARF:  Possibly atn vs hypovolemia and component of retention.  Now s/p foley placement.  Renal US pending.  Cont gentle hydration- follow I/O.    Symptomatic bradycardia:  W/ hypotension- bb held.  Gently hydrate/ cards consulted.    Disposition:   Cont inpatient.    Full Code       Arrie Senate, MD

## 2019-10-12 NOTE — Progress Notes (Signed)
Internal Medicine  PGY-1   Progress Note     CC: dizziness    HistoryObtained From:  patient    Interval HX:    SBP in the 70s-80s so patient was given a 1 L bolus..    Pt seen at bedside.  Patient feeling pretty tired today. She reports that the dizziness is better.  Patient reports that all day yesterday and into the night she was little bit dizzy while getting up from a lying down or seated position.  This is new to her.  She endorses increased fatigue ever since the fall where she fractured her back since having the surgery.  Patient denies fevers, chills, chest pain, abdominal pain, nausea and vomiting.    HISTORY OF PRESENT ILLNESS:  Gabrielle Wells is a 69 y.o. female with PMH of CKD, HFpEF, HTN, morbid obesity, and GERD who p/w dizziness.  She started to feel fatigued and unwell yesterday but was able to ambulate and move around with shortness of breath on exertion.  She decided to see her PCP (Dr. Trilby Drummer) at the resident clinic for her symptoms.  At the clinic, she presented with generalized weakness, fatigue, and was diaphoretic.  She states that when he stands up, she gets dizzy and feels as though she is about to pass out.  She has not had a syncopal episode or endorses any vertigo.  She also endorses chronic back pain that seems to have been worsening since yesterday.  She has a history of lumbar pars fracture and back surgery.  She is compliant with her medications. She denies any shortness of breath, abdominal pain, extremity swelling, chest pain, fever, cough, chills or changes in bowel/urinary movements.     ED course: Patient presented diaphoretic and pale.  She was hypotensive in the 80s SBP and bradycardic with HR 55.  Lab workup was significant for elevated Cr 3.3, elevated pro-BNP 202, and negative troponin levels.  EKG revealed T-wave inversions in leads II, III and aVF with no ST segment elevations.  She was given 500 cc LR and shown much improvement of her symptoms.  CXR was negative for  findings.    Past Medical History:        Diagnosis Date   ??? CHF (congestive heart failure) ([place])    ??? 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 GERD (gastroesophageal reflux disease)    ??? HTN (hypertension)    ??? IBS (irritable bowel syndrome)    ??? Lumbar radiculopathy    ??? Morbid obesity ([place])    ??? Muscle fasciculation    ??? Neuropathic pain     back 2/2 to spinal surgeries   ??? SBO (small bowel obstruction) ([place]) 07/1999    with partial colectomy   ??? Spinal fracture of T8 vertebra ([facility])     s/p fusion    ??? Surgical site infection     back, spinal hardware    ??? Urinary incontinence        Past Surgical History:        Procedure Laterality Date   ??? APPENDECTOMY     ??? BREAST LUMPECTOMY Bilateral     benign lesions   ??? CARDIAC CATHETERIZATION  2012   ??? CHOLECYSTECTOMY     ??? HYSTERECTOMY, TOTAL ABDOMINAL     ??? TOTAL KNEE ARTHROPLASTY Right        Medications Prior to Admission:    Medications Prior to Admission: metoprolol succinate (TOPROL XL)  25 MG extended release tablet, Take 25 mg by mouth daily   loperamide (IMODIUM) 2 MG capsule, Take 2 mg by mouth 4 times daily as needed for Diarrhea  omeprazole (PRILOSEC) 20 MG delayed release capsule, Take 1 capsule by mouth every morning (before breakfast)  valsartan (DIOVAN) 80 MG tablet, Take 1 tablet by mouth daily  cloNIDine (CATAPRES) 0.2 MG tablet, Take 0.2 mg by mouth 2 times daily  gabapentin (NEURONTIN) 300 MG capsule, Take 300 mg by mouth 2 times daily.  vitamin D3 (CHOLECALCIFEROL) 25 MCG (1000 UT) TABS tablet, Take 2,000 Units by mouth daily   citalopram (CELEXA) 40 MG tablet, Take 40 mg by mouth daily  furosemide (LASIX) 80 MG tablet, Take 80 mg by mouth daily  potassium chloride (KLOR-CON M) 20 MEQ TBCR extended release tablet, Take 40 mEq by mouth 2 times daily  ??   Allergies:  Betadine [povidone iodine], Coconut oil, Codeine, Fish-derived products, Ibuprofen, Iodine, Other, and Sulfa antibiotics    Social History:   ?? TOBACCO: reports  that she has never smoked. She has never used smokeless tobacco.  ?? ETOH:   reports no history of alcohol use.  ?? DRUGS : no use  ?? Patient currently lives alone    Family History:   History reviewed. No pertinent family history.    Review of Systems: A 10 point review of systems was conducted, significant findings as notedin HPI.    Physical Exam  Constitutional:       Appearance: Normal appearance. She is morbidly obese.   HENT:      Head: Normocephalic and atraumatic.      Mouth/Throat:      Mouth: Mucous membranes are moist.      Pharynx: Oropharynx is clear.   Eyes:      Conjunctiva/sclera: Conjunctivae normal.      Pupils: Pupils are equal, round, and reactive to light.   Neck:      Musculoskeletal: Normal range of motion and neck supple.   Cardiovascular:      Rate and Rhythm: Regular rhythm. Bradycardia present.      Pulses: Normal pulses.      Heart sounds: Normal heart sounds.   Pulmonary:      Effort: Pulmonary effort is normal.      Breath sounds: Normal breath sounds.   Abdominal:      General: Bowel sounds are normal. There is distension.      Palpations: Abdomen is soft. There is no mass.      Tenderness: There is abdominal tenderness.      Comments: Patient very sensitive to deep palpation of the abdomen she has rashes from hernias.   Musculoskeletal:         General: Tenderness (lumbar spine) present.      Right lower leg: Edema (1+) present.      Left lower leg: Edema (1+) present.   Skin:     General: Skin is warm and dry.      Capillary Refill: Capillary refill takes less than 2 seconds.   Neurological:      General: No focal deficit present.      Mental Status: She is alert and oriented to person, place, and time. Mental status is at baseline.          Vitals:    10/12/19 0724   BP: 114/79   Pulse: 64   Resp: 17   Temp: 97.7 ??F (36.5 ??C)   SpO2: 99%  DATA:    Labs:  BMP:   Recent Labs     10/11/19  1432 10/12/19  0445   NA 137 137   K 4.1 4.0   CL 99 102   CO2 24 23   BUN 32* 36*    CREATININE 3.3* 3.2*   GLUCOSE 124* 88     CBC:   Recent Labs     10/11/19  1432 10/12/19  0445   WBC 6.7 5.2   HGB 14.0 12.4   HCT 45.3 38.8   PLT 137 112*       LFT's: No results for input(s): AST, ALT, ALB, BILITOT, ALKPHOS in the last 72 hours.  Troponin:   Recent Labs     10/11/19  1432   TROPONINI <0.01     BNP: No results for input(s): BNP in the last 72 hours.  ABGs: No results for input(s): PHART, PCO2ART, PO2ART in the last 72 hours.  INR:   Recent Labs     10/11/19  1711   INR 1.03       U/A:  Recent Labs     10/11/19  2135   COLORU Yellow   PHUR 6.0   WBCUA 3-5   RBCUA 0-2   CLARITYU Clear   SPECGRAV >=1.030   LEUKOCYTESUR TRACE*   UROBILINOGEN 0.2   BILIRUBINUR Negative   BLOODU Negative   GLUCOSEU Negative       XR LUMBAR SPINE (2-3 VIEWS)   Final Result   Impression: No acute osseous abnormality.      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      XR CHEST PORTABLE   Final Result      Clear lungs.      Normal cardiac mediastinal silhouette      Elevated right hemidiaphragm.      Right shoulder arthroplasty noted.      US RENAL COMPLETE    (Results Pending)       ASSESSMENT AND PLAN:  Gabrielle Wells is a 69 y.o. female who presents with dizziness and was admitted for symptomatic bradycardia and AKI.    # Dizziness 2/2 symptomatic bradycardia vs dehydration  Poor po intake; EKG revealed sinus bradycardia w/ T-wave inversions. 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  BUN:Cr less than 20.  Likely secondary to ATN.  - NS at 100 mL/hr, with minimal improvement in creatinine  - NS 125 cc/hr  - urine analysis dirty with 21-50 epithelial cells. There is trace leukocyte esterase  and trace protein.  - holding home diuretics  - renal ultrasound  -Avoid nephrotoxic agents and contrast if possible  -Consider nephrology consult    # Chronic lumbar back pain  History of lumbar pars fracture, lumbar radiculopathy and epidural abscess. No follow up lumbar XR s/p I&D.  - XR lumbar spine revealed moderate to severe facet arthropathy in the lower lumbar spine.  Grade 1 retrolisthesis L2 on L3.  - continue home gabapentin    Hypertension - holding home blood pressure medications  GERD - continue protonix  Morbid Obesity: Body  mass index is 49.83 kg/m??.. Complicating assessment and treatment. Placing patient at risk for multiple co-morbidities as well as early death and contributing to the patient's presentation. Counseled on diet, weight loss    Code Status: Full Code  FEN:IVF: 125 mL/hr; DIET CARDIAC;  PPX: Heparin  DISPO: GMF    This patient will be discussed with attending, Dr Vonzella Nipple.    Noreene Filbert DO, PGY- 1  10/12/2019,  9:21 AM

## 2019-10-12 NOTE — Progress Notes (Signed)
VSS. Pt has been sleeping for intervals. Pt assisted to BR, urine collected and specimen sent as ordered.  Pt c/o pain in back on neck. Tylenol given with relief. IVFs infusing and pt encouraged to drink po fluids as well. Fall precautions in place and call light in reach. Will continue to monitor.  Electronically signed by Burgess Amor, RN on 10/12/19 at 1:28 AM EST

## 2019-10-12 NOTE — Progress Notes (Signed)
Pt's BP now 113/82 and Hr 76. Pt continues to sleep.  Will continue to monitor.  Electronically signed by Burgess Amor, RN on 10/12/19 at 5:05 AM EST

## 2019-10-12 NOTE — Consults (Signed)
Cardiology Consultation                                                                    Pt Name: Gabrielle Wells  Age: 69 y.o.  Sex: female  DOB: 10-Jun-1951  Location: 5304/5304-01    Referring Physician: Brent Bulla, MD  Primary cardiologist: Dr Keenan Bachelor, new      Reason for Consult:     Reason for Consultation/Chief Complaint: bradycardia    HPI:      Gabrielle Wells is a 69 y.o. female with a past medical history of morbid obesity, HTN, HFpEF, CKD, GERD.     Echo 05/2015: LVEF [id number], diastolic dysfunction 1.    Patient presented to the emergency room on 2/22 with complaints of orthostatic lightheadedness, fatigue, dyspnea, URI symptoms (runny nose, congestion).  Patient has been compliant with her medications.     At the ED she was afebrile, HR 59, BP 88/49 mmHg, normal oxygen saturation on room air.  Creatinine 3.3 (baseline 1.0), troponin negative, proBNP 200, CBC normal, chest x-ray unremarkable/normal, ECG consistent with sinus bradycardia 55 bpm, nonspecific changes.  Patient was admitted for hypotension due to dehydration, sinus bradycardia, AKI.  She received a total of 2 L of IV fluids as boluses.  Cardiology was consulted due to bradycardia.    Home medications Lasix 80 mg daily, Toprol 25 daily, valsartan 80 daily, clonidine 0.2 twice daily.    Patient reports feeling improved today.      Histories     Past Medical History:   has a past medical history of CHF (congestive heart failure) ([place]), Epidural abscess, GERD (gastroesophageal reflux disease), HTN (hypertension), IBS (irritable bowel syndrome), Lumbar radiculopathy, Morbid obesity ([place]), Muscle fasciculation, Neuropathic pain, SBO (small bowel obstruction) ([place]), Spinal fracture of T8 vertebra ([place]), Surgical site infection, and Urinary incontinence.    Surgical History:   has a past surgical history that includes Appendectomy; Breast lumpectomy (Bilateral); Hysterectomy, total abdominal; Cholecystectomy; Total knee arthroplasty (Right); and  Cardiac catheterization (2012).     Social History:   reports that she has never smoked. She has never used smokeless tobacco. She reports that she does not drink alcohol or use drugs.     Family History:  No evidence for sudden cardiac death or premature CAD      Medications:       Home Medications  Were reviewed and are listed in nursing record. and/or listed below  Prior to Admission medications    Medication Sig Start Date End Date Taking? Authorizing Provider   metoprolol succinate (TOPROL XL) 25 MG extended release tablet Take 25 mg by mouth daily    Yes Historical Provider, MD   loperamide (IMODIUM) 2 MG capsule Take 2 mg by mouth 4 times daily as needed for Diarrhea   Yes Historical Provider, MD   omeprazole (PRILOSEC) 20 MG delayed release capsule Take 1 capsule by mouth every morning (before breakfast) 09/08/19  Yes Delmar Landau, MD   valsartan (DIOVAN) 80 MG tablet Take 1 tablet by mouth daily 08/04/19  Yes Hina Virk, MD   cloNIDine (CATAPRES) 0.2 MG tablet Take 0.2 mg by mouth 2 times daily   Yes Historical Provider, MD   gabapentin (NEURONTIN) 300 MG capsule Take  300 mg by mouth 2 times daily.   Yes Historical Provider, MD   vitamin D3 (CHOLECALCIFEROL) 25 MCG (1000 UT) TABS tablet Take 2,000 Units by mouth daily    Yes Historical Provider, MD   citalopram (CELEXA) 40 MG tablet Take 40 mg by mouth daily   Yes Historical Provider, MD   furosemide (LASIX) 80 MG tablet Take 80 mg by mouth daily   Yes Historical Provider, MD   potassium chloride (KLOR-CON M) 20 MEQ TBCR extended release tablet Take 40 mEq by mouth 2 times daily   Yes Historical Provider, MD          Inpatient Medications:  ??? citalopram  40 mg Oral Daily   ??? pantoprazole  40 mg Oral QAM AC   ??? gabapentin  300 mg Oral BID   ??? Vitamin D  2,000 Units Oral Daily   ??? sodium chloride flush  10 mL Intravenous 2 times per day   ??? heparin (porcine)  5,000 Units Subcutaneous 3 times per day   ??? influenza virus vaccine  0.5 mL Intramuscular Prior to  discharge       IV drips:  ??? sodium chloride 125 mL/hr at 10/12/19 1011       PRN:  perflutren lipid microspheres, sodium chloride flush, promethazine **OR** ondansetron, polyethylene glycol, acetaminophen **OR** acetaminophen    Allergy:     Betadine [povidone iodine], Coconut oil, Codeine, Fish-derived products, Ibuprofen, Iodine, Other, and Sulfa antibiotics       Review of Systems:     All 12 point review of symptoms completed. Pertinent positives identified in the HPI, all other review of symptoms negative as below.    CONSTITUTIONAL: No fatigue  SKIN: No rash or pruritis.  EYES: No visual changes or diplopia. No scleral icterus.  ENT: No Headaches, hearing loss or vertigo. No mouth sores or sore throat.  CARDIOVASCULAR: No chest pain/chest pressure/chest discomfort. No palpitations. No edema.   RESPIRATORY: + dyspnea. + cough or wheezing, no sputum production.  GASTROINTESTINAL: No N/V/D. No abdominal pain, appetite loss, blood in stools.  GENITOURINARY: No dysuria, trouble voiding, or hematuria.  MUSCULOSKELETAL:  No gait disturbance, weakness or joint complaints.  NEUROLOGICAL:+ orthostatic dizziness  ENDOCRINE: No excessive thirst, fluid intake, or urination. No tremor.  HEMATOLOGIC: No abnormal bruising or bleeding.  ALLERGY: No nasal congestion or hives.      Physical Examination:     Vitals:    10/12/19 0331 10/12/19 0504 10/12/19 0724 10/12/19 1108   BP: (!) 78/52 113/82 114/79 114/66   Pulse:  76 64 70   Resp:   17 20   Temp:   97.7 ??F (36.5 ??C) 98 ??F (36.7 ??C)   TempSrc:   Oral Oral   SpO2:   99% 98%   Weight:       Height:           Wt Readings from Last 3 Encounters:   10/11/19 281 lb 4.9 oz (127.6 kg)   10/11/19 281 lb 4.8 oz (127.6 kg)   09/08/19 280 lb (127 kg)         General Appearance:  Alert, cooperative, no distress, appears stated age Appropriate weight   Head:  Normocephalic, without obvious abnormality, atraumatic   Eyes:  PERRL, conjunctiva/corneas clear EOM intact  Ears normal   Throat  no lesions       Nose: Nares normal, no drainage or sinus tenderness   Throat: Lips, mucosa, and tongue normal   Neck: Supple, symmetrical,  trachea midline, no adenopathy, thyroid: not enlarged, symmetric, no tenderness/mass/nodules, no carotid bruit.        Lungs:   Respirations unlabored, clear to auscultation bilaterally, without any wheezes, rubs or ronchi.    Chest Wall:  No tenderness or deformity   Heart:  Regular rhythm, rate is controlled, S1, S2 normal, there is no murmur, there is no rub or gallop, no jvd, no bilateral lower extremity edema   Abdomen:   Soft, non-tender, bowel sounds active all four quadrants,  no masses, no organomegaly       Extremities: Extremities normal, atraumatic, no cyanosis.    Pulses: 2+ and symmetric   Skin: Skin color, texture, turgor normal, no rashes or lesions   Pysch: Normal mood and affect   Neurologic: Normal gross motor and sensory exam.  Cranial nerves intact        Labs:     Recent Labs     10/11/19  1432 10/12/19  0445   NA 137 137   K 4.1 4.0   BUN 32* 36*   CREATININE 3.3* 3.2*   CL 99 102   CO2 24 23   GLUCOSE 124* 88   CALCIUM 9.3 8.7     Recent Labs     10/11/19  1432 10/12/19  0445   WBC 6.7 5.2   HGB 14.0 12.4   HCT 45.3 38.8   PLT 137 112*   MCV 88.6 87.2     No results for input(s): CHOLTOT, TRIG, HDL in the last 72 hours.    Invalid input(s): LIPIDCOMM, CHOLHDL, VLDCHOL, LDL  Recent Labs     10/11/19  1711   INR 1.03     Recent Labs     10/11/19  1432   TROPONINI <0.01     No results for input(s): BNP in the last 72 hours.  No results for input(s): TSH in the last 72 hours.  No results for input(s): CHOL, HDL, LDLCALC, TRIG in the last 72 hours.]    Lab Results   Component Value Date    TROPONINI <0.01 10/11/2019         Imaging:     Telemetry:  Sinus brady 50s    Assessment / Plan:     1.  Hypotension:  It is most like due to dehydration, cannot exclude concomitant infection (URI, rule out Covid).    -Agree with IV fluids, will start normal saline at 125  mL/h  -Please check for Covid  -Agree with holding BP meds.    2.  Sinus bradycardia:  It is most likely due to severe AKI in the setting of beta-blockers and clonidine.    -Agree with holding medications    3.Chronic HFpEF:   Patient is hypovolemic and hypotensive.     -Will treat as above.         I have personally reviewed the reports and images of labs, radiological studies, cardiac studies including ECG's and telemetry, current and old medical records. The note was completed using EMR and Dragon dictation system. Every effort was made to ensure accuracy; however, inadvertent computerized transcription errors may be present.    All questions and concerns were addressed to the patient/family. Alternatives to my treatment were discussed.     I would like to thank you for providing me the opportunity to participate in the care of your patient. If you have any questions, please do not hesitate to contact me.     Hervey Ard, MD, [facility],  [place]  [address]  [place] [address]  Ph: [phone]  Fax: [phone]

## 2019-10-12 NOTE — Progress Notes (Signed)
Foley catheter placed per order. Clear yellow urine returned.

## 2019-10-12 NOTE — Unmapped (Signed)
brYour patient was seen at a [facility]. Please go to [email or web address] to view information filed to your patient's chart in Epic.    If you need to view your patient's results prior to gaining access to Epic CareLink, please contact the [facility] where your patient was seen.              CONSULTS      Consults by Laurette Schimke, MD at 10/12/2019 12:31 PM  Version 1 of 1    Author: Laurette Schimke, MD Service: Cardiology Author Type: Physician    Filed: 10/12/2019 12:39 PM Date of Service: 10/12/2019 12:31 PM Status: Signed    Editor: Laurette Schimke, MD (Physician)      Consult Orders      1. Consult to Cardiology [[id number]] ordered by Dante Gang, MD at 10/11/19 1719               Cardiology Consultation         Pt Name: Veronica Winters  Age: 69 y.o.  Sex: female  DOB: Oct 01, 1950  Location: 5304/5304-01    Referring Physician: Marcie Bal, MD  Primary cardiologist: Dr Harrison Mons, new      Reason for Consult:     Reason for Consultation/Chief Complaint: bradycardia    HPI:      Veronica Winters is a 69 y.o. female with a past medical history of morbid obesity, HTN, HFpEF, CKD, GERD.     Echo 05/2015: LVEF 50%, diastolic dysfunction 1.    Patient presented to the emergency room on 2/22 with complaints of orthostatic lightheadedness, fatigue, dyspnea, URI symptoms (runny nose, congestion). Patient has been compliant with her medications.     At the ED she was afebrile, HR 59, BP 88/49 mmHg, normal oxygen saturation on room air. Creatinine 3.3 (baseline 1.0), troponin negative, proBNP 200, CBC normal, chest x-ray unremarkable/normal, ECG consistent with sinus bradycardia 55 bpm, nonspecific   changes. Patient was admitted for hypotension due to dehydration, sinus bradycardia, AKI.  She received a total of 2 L of IV fluids as boluses.  Cardiology was consulted due to bradycardia.    Home medications Lasix 80 mg daily, Toprol 25 daily, valsartan 80 daily, clonidine  0.2 twice daily.    Patient reports feeling improved today.      Histories     Past Medical History:   has a past medical history of CHF (congestive heart failure) (HCC), Epidural abscess, GERD (gastroesophageal reflux disease), HTN (hypertension), IBS (irritable bowel syndrome), Lumbar radiculopathy, Morbid obesity (HCC), Muscle fasciculation,   Neuropathic pain, SBO (small bowel obstruction) (HCC), Spinal fracture of T8 vertebra (HCC), Surgical site infection, and Urinary incontinence.    Surgical History:   has a past surgical history that includes Appendectomy; Breast lumpectomy (Bilateral); Hysterectomy, total abdominal; Cholecystectomy; Total knee arthroplasty (Right); and Cardiac catheterization (2012).     Social History:   reports that she has never smoked. She has never used smokeless tobacco. She reports that she does not drink alcohol or use drugs.     Family History:  No evidence for sudden cardiac death or premature CAD      Medications:       Home Medications  Were reviewed and are listed in nursing record. and/or listed below  Prior to Admission medications    Medication Sig Start Date End Date Taking? Authorizing Provider   metoprolol succinate (TOPROL XL) 25 MG extended release tablet Take 25 mg by mouth daily  Yes Historical Provider, MD   loperamide (IMODIUM) 2 MG capsule Take 2 mg by mouth 4 times daily as needed for Diarrhea   Yes Historical Provider, MD   omeprazole (PRILOSEC) 20 MG delayed release capsule Take 1 capsule by mouth every morning (before breakfast) 09/08/19  Yes Ronny Flurry, MD   valsartan (DIOVAN) 80 MG tablet Take 1 tablet by mouth daily 08/04/19  Yes Hina Virk, MD   cloNIDine (CATAPRES) 0.2 MG tablet Take 0.2 mg by mouth 2 times daily   Yes Historical Provider, MD   gabapentin (NEURONTIN) 300 MG capsule Take 300 mg by mouth 2 times daily.   Yes Historical Provider, MD   vitamin D3 (CHOLECALCIFEROL) 25 MCG (1000 UT) TABS tablet Take 2,000 Units by mouth daily     Yes Historical Provider, MD   citalopram (CELEXA) 40 MG tablet Take 40 mg by mouth daily   Yes Historical Provider, MD   furosemide (LASIX) 80 MG tablet Take 80 mg by mouth daily   Yes Historical Provider, MD   potassium chloride (KLOR-CON M) 20 MEQ TBCR extended release tablet Take 40 mEq by mouth 2 times daily   Yes Historical Provider, MD          Inpatient Medications:  ? citalopram  40 mg Oral Daily   ? pantoprazole  40 mg Oral QAM AC   ? gabapentin  300 mg Oral BID   ? Vitamin D  2,000 Units Oral Daily   ? sodium chloride flush  10 mL Intravenous 2 times per day   ? heparin (porcine)  5,000 Units Subcutaneous 3 times per day   ? influenza virus vaccine  0.5 mL Intramuscular Prior to discharge       IV drips:  ? sodium chloride 125 mL/hr at 10/12/19 1011       PRN:  perflutren lipid microspheres, sodium chloride flush, promethazine **OR** ondansetron, polyethylene glycol, acetaminophen **OR** acetaminophen    Allergy:     Betadine [povidone iodine], Coconut oil, Codeine, Fish-derived products, Ibuprofen, Iodine, Other, and Sulfa antibiotics       Review of Systems:     All 12 point review of symptoms completed. Pertinent positives identified in the HPI, all other review of symptoms negative as below.    CONSTITUTIONAL: No fatigue  SKIN: No rash or pruritis.  EYES: No visual changes or diplopia. No scleral icterus.  ENT: No Headaches, hearing loss or vertigo. No mouth sores or sore throat.  CARDIOVASCULAR: No chest pain/chest pressure/chest discomfort. No palpitations. No edema.   RESPIRATORY: + dyspnea. + cough or wheezing, no sputum production.  GASTROINTESTINAL: No N/V/D. No abdominal pain, appetite loss, blood in stools.  GENITOURINARY: No dysuria, trouble voiding, or hematuria.  MUSCULOSKELETAL: No gait disturbance, weakness or joint complaints.  NEUROLOGICAL:+ orthostatic dizziness  ENDOCRINE: No excessive thirst, fluid intake, or urination. No tremor.  HEMATOLOGIC: No abnormal bruising or  bleeding.  ALLERGY: No nasal congestion or hives.      Physical Examination:     Vitals:    10/12/19 0331 10/12/19 0504 10/12/19 0724 10/12/19 1108   BP: (!) 78/52 113/82 114/79 114/66   Pulse:  76 64 70   Resp:   17 20   Temp:   97.7 ?F (36.5 ?C) 98 ?F (36.7 ?C)   TempSrc:   Oral Oral   SpO2:   99% 98%   Weight:       Height:           Wt Readings from  Last 3 Encounters:   10/11/19 281 lb 4.9 oz (127.6 kg)   10/11/19 281 lb 4.8 oz (127.6 kg)   09/08/19 280 lb (127 kg)         General Appearance:  Alert, cooperative, no distress, appears stated age Appropriate weight   Head:  Normocephalic, without obvious abnormality, atraumatic   Eyes:  PERRL, conjunctiva/corneas clear EOM intact  Ears normal   Throat no lesions       Nose: Nares normal, no drainage or sinus tenderness   Throat: Lips, mucosa, and tongue normal   Neck: Supple, symmetrical, trachea midline, no adenopathy, thyroid: not enlarged, symmetric, no tenderness/mass/nodules, no carotid bruit.        Lungs:  Respirations unlabored, clear to auscultation bilaterally, without any wheezes, rubs or ronchi.    Chest Wall:  No tenderness or deformity   Heart:  Regular rhythm, rate is controlled, S1, S2 normal, there is no murmur, there is no rub or gallop, no jvd, no bilateral lower extremity edema   Abdomen:  Soft, non-tender, bowel sounds active all four quadrants, no masses, no organomegaly       Extremities: Extremities normal, atraumatic, no cyanosis.    Pulses: 2+ and symmetric   Skin: Skin color, texture, turgor normal, no rashes or lesions   Pysch: Normal mood and affect   Neurologic: Normal gross motor and sensory exam. Cranial nerves intact        Labs:     Recent Labs     10/11/19  1432 10/12/19  0445   NA 137 137   K 4.1 4.0   BUN 32* 36*   CREATININE 3.3* 3.2*   CL 99 102   CO2 24 23   GLUCOSE 124* 88   CALCIUM 9.3 8.7     Recent Labs     10/11/19  1432 10/12/19  0445   WBC 6.7 5.2   HGB 14.0 12.4   HCT 45.3 38.8   PLT 137 112*   MCV 88.6 87.2     No  results for input(s): CHOLTOT, TRIG, HDL in the last 72 hours.    Invalid input(s): LIPIDCOMM, CHOLHDL, VLDCHOL, LDL  Recent Labs     10/11/19  1711   INR 1.03     Recent Labs     10/11/19  1432   TROPONINI <0.01     No results for input(s): BNP in the last 72 hours.  No results for input(s): TSH in the last 72 hours.  No results for input(s): CHOL, HDL, LDLCALC, TRIG in the last 72 hours.]    Lab Results   Component Value Date    TROPONINI <0.01 10/11/2019         Imaging:     Telemetry:  Sinus brady 50s    Assessment / Plan:     1. Hypotension:  It is most like due to dehydration, cannot exclude concomitant infection (URI, rule out Covid).    Agree with IV fluids, will start normal saline at 125 mL/h  Please check for Covid  Agree with holding BP meds.    2. Sinus bradycardia:  It is most likely due to severe AKI in the setting of beta-blockers and clonidine.    Agree with holding medications    3.Chronic HFpEF:   Patient is hypovolemic and hypotensive.     -Will treat as above.         I have personally reviewed the reports and images of labs, radiological studies, cardiac studies including  ECG's and telemetry, current and old medical records. The note was completed using EMR and Dragon dictation system. Every effort was made to ensure   accuracy; however, inadvertent computerized transcription errors may be present.    All questions and concerns were addressed to the patient/family. Alternatives to my treatment were discussed.     I would like to thank you for providing me the opportunity to participate in the care of your patient. If you have any questions, please do not hesitate to contact me.     Laurette Schimke, MD, [facility], FHFSA  [facility]  [address]  [address] [place] [address]  Ph: [phone]  Fax: [phone][GB.1]       Attribution Key     [id number] - Laurette Schimke, MD on 10/12/2019 12:31 PM                bmk

## 2019-10-12 NOTE — Progress Notes (Signed)
Pt urinated 100 mL. Post void bladder scan 320 mL. RN informed Dr Ulis Rias.

## 2019-10-12 NOTE — Progress Notes (Signed)
Vitals checked and pt's BP low 80's/50's. HR 66.  Pt was sleeping in bed. Denies dizziness.  Medical resident contacted and bolus ordered. Bolus infusing.  Will recheck BP.  Electronically signed by Burgess Amor, RN on 10/12/19 at 4:15 AM EST

## 2019-10-12 NOTE — Plan of Care (Signed)
Problem: Falls - Risk of:  Goal: Will remain free from falls  Description: Will remain free from falls  Outcome: Ongoing   Pt remains in fall precautions. Bed locked in low position with alarm set.  Slip free socks on. Pt using call light for needs. Assistance and walker used to ambulate in room. Pt tolerates fairly well, but sob with exertion noted as well as overall weakness.    Problem: Fluid Volume:  Goal: Signs and symptoms of dehydration will decrease  Description: Signs and symptoms of dehydration will decrease  Outcome: Ongoing  BP on the low side 90-100/50-60's.  HR 60's , SR/SB.  Pt denies feeling dizzy at this time.  IVFs infusing. Pt has voided twice, missing the hat the second time.  Will continue to to monitor intake/output and vitals.

## 2019-10-13 ENCOUNTER — Ambulatory Visit: Payer: MEDICARE | Primary: Internal Medicine

## 2019-10-13 ENCOUNTER — Inpatient Hospital Stay: Admit: 2019-10-13 | Payer: MEDICARE | Primary: Internal Medicine

## 2019-10-13 LAB — CBC WITH AUTO DIFFERENTIAL
Basophils %: 1 %
Basophils Absolute: 0 10*3/uL (ref 0.0–0.2)
Eosinophils %: 0.9 %
Eosinophils Absolute: 0 10*3/uL (ref 0.0–0.6)
Hematocrit: 40 % (ref 36.0–48.0)
Hemoglobin: 12.2 g/dL (ref 12.0–16.0)
Lymphocytes %: 50.7 %
Lymphocytes Absolute: 2.1 10*3/uL (ref 1.0–5.1)
MCH: 27.2 pg (ref 26.0–34.0)
MCHC: 30.4 g/dL — ABNORMAL LOW (ref 31.0–36.0)
MCV: 89.4 fL (ref 80.0–100.0)
MPV: 9.7 fL (ref 5.0–10.5)
Monocytes %: 20.1 %
Monocytes Absolute: 0.8 10*3/uL (ref 0.0–1.3)
Neutrophils %: 27.3 %
Neutrophils Absolute: 1.1 10*3/uL — ABNORMAL LOW (ref 1.7–7.7)
Platelets: 117 10*3/uL — ABNORMAL LOW (ref 135–450)
RBC: 4.48 M/uL (ref 4.00–5.20)
RDW: 16 % — ABNORMAL HIGH (ref 12.4–15.4)
WBC: 4.2 10*3/uL (ref 4.0–11.0)

## 2019-10-13 LAB — BASIC METABOLIC PANEL W/ REFLEX TO MG FOR LOW K
Anion Gap: 12 (ref 3–16)
BUN: 24 mg/dL — ABNORMAL HIGH (ref 7–20)
CO2: 20 mmol/L — ABNORMAL LOW (ref 21–32)
Calcium: 8.6 mg/dL (ref 8.3–10.6)
Chloride: 105 mmol/L (ref 99–110)
Creatinine: 1.6 mg/dL — ABNORMAL HIGH (ref 0.6–1.2)
GFR African American: 39 — AB (ref >60)
GFR Non-African American: 32 — AB (ref >60)
Glucose: 83 mg/dL (ref 70–99)
Potassium reflex Magnesium: 4.4 mmol/L (ref 3.5–5.1)
Sodium: 137 mmol/L (ref 136–145)

## 2019-10-13 MED ORDER — HYDRALAZINE HCL 20 MG/ML IJ SOLN
20 MG/ML | Freq: Four times a day (QID) | INTRAMUSCULAR | Status: DC | PRN
Start: 2019-10-13 — End: 2019-10-14
  Administered 2019-10-14: 13:00:00 10 mg via INTRAVENOUS

## 2019-10-13 MED ORDER — HYDROXYZINE HCL 10 MG PO TABS
10 MG | ORAL | Status: DC | PRN
Start: 2019-10-13 — End: 2019-10-15
  Administered 2019-10-14 – 2019-10-15 (×2): 10 mg via ORAL

## 2019-10-13 MED ORDER — HYDRALAZINE HCL 25 MG PO TABS
25 MG | Freq: Three times a day (TID) | ORAL | Status: DC
Start: 2019-10-13 — End: 2019-10-13
  Administered 2019-10-13: 19:00:00 25 mg via ORAL

## 2019-10-13 MED ORDER — HYDROCODONE-ACETAMINOPHEN 5-325 MG PO TABS
5-325 MG | Freq: Four times a day (QID) | ORAL | Status: DC | PRN
Start: 2019-10-13 — End: 2019-10-15
  Administered 2019-10-14 – 2019-10-15 (×3): 1 via ORAL

## 2019-10-13 MED ORDER — HYDRALAZINE HCL 20 MG/ML IJ SOLN
20 MG/ML | Freq: Four times a day (QID) | INTRAMUSCULAR | Status: DC | PRN
Start: 2019-10-13 — End: 2019-10-14

## 2019-10-13 MED ORDER — HYDROCODONE-ACETAMINOPHEN 5-325 MG PO TABS
5-325 MG | Freq: Once | ORAL | Status: AC | PRN
Start: 2019-10-13 — End: 2019-10-13
  Administered 2019-10-13: 18:00:00 1 via ORAL

## 2019-10-13 MED ORDER — CARVEDILOL 3.125 MG PO TABS
3.125 MG | Freq: Two times a day (BID) | ORAL | Status: DC
Start: 2019-10-13 — End: 2019-10-15
  Administered 2019-10-14 – 2019-10-15 (×4): 3.125 mg via ORAL

## 2019-10-13 MED FILL — ACETAMINOPHEN 325 MG PO TABS: 325 mg | ORAL | Qty: 2

## 2019-10-13 MED FILL — ONDANSETRON HCL 4 MG/2ML IJ SOLN: 4 MG/2ML | INTRAMUSCULAR | Qty: 2

## 2019-10-13 MED FILL — PANTOPRAZOLE SODIUM 40 MG PO TBEC: 40 mg | ORAL | Qty: 1

## 2019-10-13 MED FILL — GABAPENTIN 300 MG PO CAPS: 300 mg | ORAL | Qty: 1

## 2019-10-13 MED FILL — HEPARIN SODIUM (PORCINE) 5000 UNIT/ML IJ SOLN: 5000 [IU]/mL | INTRAMUSCULAR | Qty: 1

## 2019-10-13 MED FILL — VITAMIN D3 25 MCG (1000 UT) PO TABS: 25 MCG (1000 UT) | ORAL | Qty: 2

## 2019-10-13 MED FILL — HYDROCODONE-ACETAMINOPHEN 5-325 MG PO TABS: 5-325 mg | ORAL | Qty: 1

## 2019-10-13 MED FILL — HYDRALAZINE HCL 25 MG PO TABS: 25 mg | ORAL | Qty: 1

## 2019-10-13 MED FILL — CITALOPRAM HYDROBROMIDE 40 MG PO TABS: 40 mg | ORAL | Qty: 1

## 2019-10-13 NOTE — Plan of Care (Signed)
Patient to increase ADL indep

## 2019-10-13 NOTE — Progress Notes (Signed)
Occupational Therapy   Occupational Therapy Initial Assessment/tx  Date: 10/13/2019   Patient Name: Gabrielle Wells  MRN: [id number]     DOB: Jan 22, 1951    Date of Service: 10/13/2019    Discharge Recommendations:  Gabrielle Wells scored a 21/24 on the AM-PAC ADL Inpatient form. Current research shows that an AM-PAC score of 18 or greater is typically associated with a discharge to the patient's home setting. Based on the patient's AM-PAC score, and their current ADL deficits, it is recommended that the patient have 2-3 sessions per week of Occupational Therapy at d/c to increase the patient's independence.  At this time, this patient demonstrates the endurance and safety to discharge home with home OT and a follow up treatment frequency of 2-3x/wk.   Please see assessment section for further patient specific details.  HOME HEALTH CARE: LEVEL 1 STANDARD    - Initial home health evaluation to occur within 24-48 hours, in patient home   - Therapy to evaluate with goal of regaining prior level of functioning   - Therapy to evaluate if patient has [place] needs for personal care  If patient discharges prior to next session this note will serve as a discharge summary.  Please see below for the latest assessment towards goals.        OT Equipment Recommendations  Other: bed rail-pt educated where to obtain, or perhaps council on aging can provied    Assessment   Performance deficits / Impairments: Decreased functional mobility ;Decreased ADL status  Assessment: Pt requires SBA with bed mobility and functional transfers, minA with LE ADLs.  Pt resides with brother and sister in law, and they can provide 24 hr A.  Recommend home with 24 hr A, home OT, and council on aging-pt could benefit from bed rail.  Treatment Diagnosis: impaired ADLs and transfers  Prognosis: Good  Decision Making: Low Complexity  OT Education: OT Role;Transfer Training  Patient Education: needs practice  REQUIRES OT FOLLOW UP: Yes  Activity  Tolerance  Activity Tolerance: Patient Tolerated treatment well  Safety Devices  Safety Devices in place: Yes  Type of devices: Nurse notified;Call light within reach;Bed alarm in place           Patient Diagnosis(es): The encounter diagnosis was AKI (acute kidney injury) ([place]).     has a past medical history of CHF (congestive heart failure) ([place]), Epidural abscess, GERD (gastroesophageal reflux disease), HTN (hypertension), IBS (irritable bowel syndrome), Lumbar radiculopathy, Morbid obesity ([place]), Muscle fasciculation, Neuropathic pain, SBO (small bowel obstruction) ([place]), Spinal fracture of T8 vertebra ([place]), Surgical site infection, and Urinary incontinence.   has a past surgical history that includes Appendectomy; Breast lumpectomy (Bilateral); Hysterectomy, total abdominal; Cholecystectomy; Total knee arthroplasty (Right); and Cardiac catheterization (2012).    Treatment Diagnosis: impaired ADLs and transfers      Restrictions  Position Activity Restriction  Other position/activity restrictions: up with assist    Subjective   General  Chart Reviewed: Yes  Additional Pertinent Hx: PMH:  CHF, HTN, GERD, IBS, chole, sinal fx T8, s/p fusion, SBO, chole, R TKR  Referring Practitioner: Dr. Vonzella Nipple  Diagnosis: Pt admitted with fatigue, lightheadedness, SOB-dx of dizziness secondary symptomatic bradycardia vs dehydration, AKI on CKD IV-abd CT=diverticulosis  Subjective  Subjective: Pt in a chair, agreeable to work with OT.  Patient Currently in Pain: Yes(abd pain-level 6)    Social/Functional History  Social/Functional History  Lives With: (with brother and sister in law)  Type of Home: House  Home Layout: Two level(stays in basement)  Home Access: (through garage no steps to living area, up 14 steps if snowing)  Bathroom Shower/Tub: Civil engineer, contracting with back, Tourist information centre manager: Handicap height(sink next to commode)  Home Equipment: Rolling walker, Secondary school teacher, Sock aid, Long-handled shoehorn  Receives Help  From: Family(states she has home care set up but it hadn't started yet)  ADL Assistance: (sometimes help pulling up pants in the back, donning bra to go out)  [place] Assistance: (family brings meals to pt, pt does own laundry and cleaning)  Ambulation Assistance: Independent(indep in house, 2 wheeled walker outside)  Transfer Assistance: Independent  Active Driver: No  Leisure & Hobbies: going to church  Additional Comments: brother takes pt to appointments       Objective        Orientation  Overall Orientation Status: (except for exact date)     Functional Mobility  Functional Mobility Comments: functional mobility with rolling walker and SBA  Toilet Transfers  Toilet - Technique: Ambulating(with rolling walker and SBA to/from bathroom)  Equipment Used: Standard toilet(grab bar)  Toilet Transfer: Stand by assistance  ADL  Grooming: Independent(washing hands, brushing teeth, using mouth wash)  UE Dressing: Contact guard assistance(hospital gown, has IV)  LE Dressing: Minimal assistance(donning brief)        Bed mobility  Supine to Sit: Stand by assistance(bed flat, multiple pillows for head, SBA with increased time/effort Simultaneous filing. User may not have seen previous data.)  Sit to Supine: Stand by assistance(bed flat, increased time/effort Simultaneous filing. User may not have seen previous data.)  Transfers  Sit to stand: Stand by assistance  Stand to sit: Stand by assistance     Cognition  Overall Cognitive Status: WFL  Cognition Comment: tearful at times secondary to changes with health/aging                 LUE AROM (degrees)  LUE AROM : WFL  RUE AROM (degrees)  RUE General AROM: ~30 shoulder flex, elbow to hand=WFL        Hand Dominance  Hand Dominance: Right     Patient participated in OT eval and tx including ADLs and transfer        Plan   Plan  Times per week: 2-5  Times per day: Daily  Current Treatment Recommendations: Hotel manager, Therapist, nutritional, Endurance Training,  Self-Care / ADL    G-Code     OutComes Score                                                  AM-PAC Score        AM-PAC Inpatient Daily Activity Raw Score: 21 (10/13/19 1210)  AM-PAC Inpatient ADL T-Scale Score : 44.27 (10/13/19 1210)  ADL Inpatient CMS 0-100% Score: 32.79 (10/13/19 1210)  ADL Inpatient CMS G-Code Modifier : CJ (10/13/19 1210)    Goals  Short term goals  Time Frame for Short term goals: discharge  Short term goal 1: pt to transfer to commode S/I  Short term goal 2: pt to don depend brief with reacher and SBA  Short term goal 3: pt to do functional tasks in room with S/SBA  Patient Goals   Patient goals : to be indep       Therapy Time   Individual Concurrent Group Co-treatment  Time In 1108         Time Out 1208         Minutes 60              Timed Code Treatment Minutes:   45    Total Treatment Minutes:  [address] OTR/L 1359

## 2019-10-13 NOTE — Progress Notes (Signed)
Internal Medicine  PGY-1   Progress Note     CC: dizziness    HistoryObtained From:  patient    Interval HX:    SBP in the 70s-80s so patient was given a 1 L bolus.    Pt seen at bedside.  Patient feeling pretty tired today. She reports that the dizziness is better.  Patient reports that all day yesterday and into the night she was little bit dizzy while getting up from a lying down or seated position.  This is new to her.  She endorses increased fatigue ever since the fall where she fractured her back since having the surgery.  Patient denies fevers, chills, chest pain, abdominal pain, nausea and vomiting.    HISTORY OF PRESENT ILLNESS:  Gabrielle Wells is a 69 y.o. female with PMH of CKD, HFpEF, HTN, morbid obesity, and GERD who p/w dizziness.  She started to feel fatigued and unwell yesterday but was able to ambulate and move around with shortness of breath on exertion.  She decided to see her PCP (Dr. Trilby Drummer) at the resident clinic for her symptoms.  At the clinic, she presented with generalized weakness, fatigue, and was diaphoretic.  She states that when he stands up, she gets dizzy and feels as though she is about to pass out.  She has not had a syncopal episode or endorses any vertigo.  She also endorses chronic back pain that seems to have been worsening since yesterday.  She has a history of lumbar pars fracture and back surgery.  She is compliant with her medications. She denies any shortness of breath, abdominal pain, extremity swelling, chest pain, fever, cough, chills or changes in bowel/urinary movements.     ED course: Patient presented diaphoretic and pale.  She was hypotensive in the 80s SBP and bradycardic with HR 55.  Lab workup was significant for elevated Cr 3.3, elevated pro-BNP 202, and negative troponin levels.  EKG revealed T-wave inversions in leads II, III and aVF with no ST segment elevations.  She was given 500 cc LR and shown much improvement of her symptoms.  CXR was negative for  findings.    Past Medical History:        Diagnosis Date   ??? CHF (congestive heart failure) ([place])    ??? 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 GERD (gastroesophageal reflux disease)    ??? HTN (hypertension)    ??? IBS (irritable bowel syndrome)    ??? Lumbar radiculopathy    ??? Morbid obesity ([place])    ??? Muscle fasciculation    ??? Neuropathic pain     back 2/2 to spinal surgeries   ??? SBO (small bowel obstruction) ([place]) 07/1999    with partial colectomy   ??? Spinal fracture of T8 vertebra ([facility])     s/p fusion    ??? Surgical site infection     back, spinal hardware    ??? Urinary incontinence        Past Surgical History:        Procedure Laterality Date   ??? APPENDECTOMY     ??? BREAST LUMPECTOMY Bilateral     benign lesions   ??? CARDIAC CATHETERIZATION  2012   ??? CHOLECYSTECTOMY     ??? HYSTERECTOMY, TOTAL ABDOMINAL     ??? TOTAL KNEE ARTHROPLASTY Right        Medications Prior to Admission:    Medications Prior to Admission: metoprolol succinate (TOPROL XL)  25 MG extended release tablet, Take 25 mg by mouth daily   loperamide (IMODIUM) 2 MG capsule, Take 2 mg by mouth 4 times daily as needed for Diarrhea  omeprazole (PRILOSEC) 20 MG delayed release capsule, Take 1 capsule by mouth every morning (before breakfast)  valsartan (DIOVAN) 80 MG tablet, Take 1 tablet by mouth daily  cloNIDine (CATAPRES) 0.2 MG tablet, Take 0.2 mg by mouth 2 times daily  gabapentin (NEURONTIN) 300 MG capsule, Take 300 mg by mouth 2 times daily.  vitamin D3 (CHOLECALCIFEROL) 25 MCG (1000 UT) TABS tablet, Take 2,000 Units by mouth daily   citalopram (CELEXA) 40 MG tablet, Take 40 mg by mouth daily  furosemide (LASIX) 80 MG tablet, Take 80 mg by mouth daily  potassium chloride (KLOR-CON M) 20 MEQ TBCR extended release tablet, Take 40 mEq by mouth 2 times daily  ??   Allergies:  Betadine [povidone iodine], Coconut oil, Codeine, Fish-derived products, Ibuprofen, Iodine, Other, and Sulfa antibiotics    Social History:   ?? TOBACCO: reports  that she has never smoked. She has never used smokeless tobacco.  ?? ETOH:   reports no history of alcohol use.  ?? DRUGS : no use  ?? Patient currently lives alone    Family History:   History reviewed. No pertinent family history.    Review of Systems: A 10 point review of systems was conducted, significant findings as notedin HPI.    Physical Exam  Constitutional:       Appearance: Normal appearance. She is morbidly obese.   HENT:      Head: Normocephalic and atraumatic.      Mouth/Throat:      Mouth: Mucous membranes are moist.      Pharynx: Oropharynx is clear.   Eyes:      Conjunctiva/sclera: Conjunctivae normal.      Pupils: Pupils are equal, round, and reactive to light.   Neck:      Musculoskeletal: Normal range of motion and neck supple.   Cardiovascular:      Rate and Rhythm: Regular rhythm. Bradycardia present.      Pulses: Normal pulses.      Heart sounds: Normal heart sounds.   Pulmonary:      Effort: Pulmonary effort is normal.      Breath sounds: Normal breath sounds.   Abdominal:      General: Bowel sounds are normal. There is distension.      Palpations: Abdomen is soft. There is no mass.      Tenderness: There is abdominal tenderness.      Comments: Patient very sensitive to deep palpation of the abdomen she has rashes from hernias.   Musculoskeletal:         General: Tenderness (lumbar spine) present.      Right lower leg: Edema (1+) present.      Left lower leg: Edema (1+) present.   Skin:     General: Skin is warm and dry.      Capillary Refill: Capillary refill takes less than 2 seconds.   Neurological:      General: No focal deficit present.      Mental Status: She is alert and oriented to person, place, and time. Mental status is at baseline.          Vitals:    10/13/19 1052   BP: (!) 162/78   Pulse: 67   Resp: 16   Temp: 97.8 ??F (36.6 ??C)   SpO2: 97%  DATA:    Labs:  BMP:   Recent Labs     10/12/19  0445 10/12/19  1722 10/13/19  0524   NA 137 138 137   K 4.0 3.9 4.4   CL 102 107 105    CO2 23 24 20*   BUN 36* 28* 24*   CREATININE 3.2* 1.9* 1.6*   GLUCOSE 88 111* 83     CBC:   Recent Labs     10/11/19  1432 10/12/19  0445 10/13/19  0524   WBC 6.7 5.2 4.2   HGB 14.0 12.4 12.2   HCT 45.3 38.8 40.0   PLT 137 112* 117*       LFT's: No results for input(s): AST, ALT, ALB, BILITOT, ALKPHOS in the last 72 hours.  Troponin:   Recent Labs     10/11/19  1432   TROPONINI <0.01     BNP: No results for input(s): BNP in the last 72 hours.  ABGs: No results for input(s): PHART, PCO2ART, PO2ART in the last 72 hours.  INR:   Recent Labs     10/11/19  1711   INR 1.03       U/A:  Recent Labs     10/11/19  2135   COLORU Yellow   PHUR 6.0   WBCUA 3-5   RBCUA 0-2   CLARITYU Clear   SPECGRAV >=1.030   LEUKOCYTESUR TRACE*   UROBILINOGEN 0.2   BILIRUBINUR Negative   BLOODU Negative   GLUCOSEU Negative       CT ABDOMEN PELVIS WO CONTRAST   Final Result      3.3 x 3.0 cm round low-attenuation lesion of the lateral upper pole of the right kidney, consistent with benign renal cortical cysts.      Diverticulosis of the distal colon.      No other acute findings in the abdomen or pelvis.      US RENAL COMPLETE   Final Result   IMPRESSION :      Indeterminate hypoechoic lesion in the right upper kidney requires further evaluation as detailed above.      No hydronephrosis.         XR LUMBAR SPINE (2-3 VIEWS)   Final Result   Impression: No acute osseous abnormality.      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      XR CHEST PORTABLE   Final Result      Clear lungs.      Normal cardiac mediastinal silhouette      Elevated right hemidiaphragm.      Right shoulder arthroplasty noted.          ASSESSMENT AND PLAN:  Gabrielle Wells is a 69 y.o. female who presents with dizziness and was admitted for symptomatic bradycardia and AKI.    # Dizziness 2/2 symptomatic bradycardia vs dehydration  Poor po intake; EKG revealed sinus bradycardia w/ T-wave inversions. No recent cardiology follow ups; must r/o medication  side effects   - NS at 100 mL/hr  - holding home metoprolol  - orthostatics BP  - cardiology consult; appreciate recs    # Sinus bradycardia  Likely 2/2 severe AKI in setting of BB @ clonidine per cards  - hold BB    #Abdominal pain  -CT abdomen pelvis revealed 3.3 x 3.0 cm round low-attenuation lesion of the lateral upper pole of the right kidney, consistent with benign renal cortical cysts. Diverticulosis of the distal colon.     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  BUN:Cr less than 20.  Likely secondary to retention   - Bladder scan pt retaining after voiding, inserted foley, Cr improving   -Continue with Foley.  At discharge will take it out and evaluate patient for retention.  - NS 125 cc/hr  - urine analysis dirty with 21-50 epithelial cells. There is trace leukocyte esterase and trace protein.  - holding home diuretics  - renal ultrasound revealed indeterminate hypoechoic lesion in the right upper kidney requires further evaluation as detailed above.   -Avoid nephrotoxic agents and contrast if possible  -Consider nephrology consult    # Thrombocytopenia   - PLTs 112 -> 117    # Chronic lumbar back pain  History of lumbar pars fracture, lumbar radiculopathy and epidural abscess. No follow up lumbar XR s/p I&D.  - XR lumbar spine revealed moderate to severe facet arthropathy in the lower lumbar spine.  Grade 1 retrolisthesis L2 on L3.  - continue home gabapentin    Hypertension - holding valsartan and furosemide secondary to AKI.  Holding metoprolol secondary to bradycardia.  -Started oral hydralazine 25 mg 3 times daily  GERD - continue protonix  Morbid Obesity: Body mass index is 49.83 kg/m??.. Complicating assessment and treatment. Placing patient at risk for multiple co-morbidities as well as early death and contributing to the patient's presentation. Counseled on diet, weight loss    Code Status: Full Code  FEN:IVF: 125 mL/hr; DIET CARDIAC;  PPX: Heparin  DISPO:  GMF    This patient will be discussed with attending, Dr Vonzella Nipple.    Noreene Filbert DO, PGY- 1  10/13/2019,  12:02 PM

## 2019-10-13 NOTE — Progress Notes (Signed)
Pt a/o x4, BP elevated throughout shift- MD notified meds given as ordered- see MAR. Pt began complaining of abdominal pain this morning- abd/pelvis scan completed. Pt has been up to walk the halls this shift. Foley remains in place with adequate yellow output. Labs improved this morning vs yesterday- NS infusing _0  through PIV. All needs met at this time, will continue with plan of care.

## 2019-10-13 NOTE — Progress Notes (Signed)
Cardiology Consult Service  Daily Progress Note        Admit Date:  10/11/2019  Primary cardiologist: Dr Keenan Bachelor, new    Reason for Consultation/Chief Complaint: bradycardia    Subjective:     Gabrielle Wells is a 69 y.o. female with a past medical history of morbid obesity, HTN, HFpEF, CKD, GERD.   ??  Echo 05/2015: LVEF [id number], diastolic dysfunction 1.  ??  Patient presented to the emergency room on 2/22 with complaints of orthostatic lightheadedness, fatigue, dyspnea, URI symptoms (runny nose, congestion).  Patient has been compliant with her medications.   ??  At the ED she was afebrile, HR 59, BP 88/49 mmHg, normal oxygen saturation on room air.  Creatinine 3.3 (baseline 1.0), troponin negative, proBNP 200, CBC normal, chest x-ray unremarkable/normal, ECG consistent with sinus bradycardia 55 bpm, nonspecific changes.  Patient was admitted for hypotension due to dehydration, sinus bradycardia, AKI.  She received a total of 2 L of IV fluids as boluses.  Cardiology was consulted due to bradycardia.  ??  Home medications Lasix 80 mg daily, Toprol 25 daily, valsartan 80 daily, clonidine 0.2 twice daily.    Interval history:  Patient reports no complaints.  BP is now elevated.  Creatinine improving at 1.6 from 1.9 from 3.2 on normal saline at 125 mL/h.    Objective:     Medications:  ??? carvedilol  3.125 mg Oral BID   ??? citalopram  40 mg Oral Daily   ??? pantoprazole  40 mg Oral QAM AC   ??? gabapentin  300 mg Oral BID   ??? Vitamin D  2,000 Units Oral Daily   ??? sodium chloride flush  10 mL Intravenous 2 times per day   ??? heparin (porcine)  5,000 Units Subcutaneous 3 times per day   ??? influenza virus vaccine  0.5 mL Intramuscular Prior to discharge       IV drips:  ??? sodium chloride 125 mL/hr at 10/13/19 1417       PRN:  hydrALAZINE, hydrOXYzine, HYDROcodone 5 mg - acetaminophen, hydrALAZINE, sodium chloride flush, promethazine **OR** ondansetron, polyethylene glycol, acetaminophen **OR** acetaminophen    Vitals:    10/13/19 1052  10/13/19 1352 10/13/19 1526 10/13/19 1652   BP: (!) 162/78 (!) 185/88 (!) 170/85 (!) 159/76   Pulse: 67 80     Resp: 16 16     Temp: 97.8 ??F (36.6 ??C) 97.9 ??F (36.6 ??C)     TempSrc: Oral Oral     SpO2: 97% 99%     Weight:       Height:           Intake/Output Summary (Last 24 hours) at 10/13/2019 1731  Last data filed at 10/13/2019 1233  Gross per 24 hour   Intake 200 ml   Output 1300 ml   Net -1100 ml     I/O last 3 completed shifts:  In: 200 [P.O.:200]  Out: 1600 [Urine:1600]  Wt Readings from Last 3 Encounters:   10/11/19 281 lb 4.9 oz (127.6 kg)   10/11/19 281 lb 4.8 oz (127.6 kg)   09/08/19 280 lb (127 kg)       Admit Wt: Weight: 281 lb 4.9 oz (127.6 kg)   Todays Wt: Weight: 281 lb 4.9 oz (127.6 kg)    TELEMETRY: Sinus     Physical Exam:         General Appearance:  Alert, cooperative, no distress, appears stated age Appropriate weight   Head:  Normocephalic,  without obvious abnormality, atraumatic   Eyes:  PERRL, conjunctiva/corneas clear EOM intact  Ears normal   Throat no lesions       Nose: Nares normal, no drainage or sinus tenderness   Throat: Lips, mucosa, and tongue normal   Neck: Supple, symmetrical, trachea midline, no adenopathy, thyroid: not enlarged, symmetric, no tenderness/mass/nodules, no carotid bruit.        Lungs:   Normal respiratory rate, lungs clear to auscultation without any wheezes, rubs or ronchi bilaterally.    Chest Wall:  No tenderness or deformity   Heart:  Regular rhythm, rate is controlled, S1, S2 normal, there is no murmur, there is no rub or gallop, no jvd, no bilateral lower extremity edema   Abdomen:   Soft, non-tender, bowel sounds active all four quadrants,  no masses, no organomegaly       Extremities: Extremities normal, atraumatic, no cyanosis.    Pulses: 2+ and symmetric   Skin: Skin color, texture, turgor normal, no rashes or lesions   Pysch: Normal mood and affect   Neurologic: Normal gross motor and sensory exam.  Cranial nerves intact       Labs:   Recent Labs      10/12/19  0445 10/12/19  1722 10/13/19  0524   NA 137 138 137   K 4.0 3.9 4.4   BUN 36* 28* 24*   CREATININE 3.2* 1.9* 1.6*   CL 102 107 105   CO2 23 24 20*   GLUCOSE 88 111* 83   CALCIUM 8.7 7.9* 8.6     Recent Labs     10/11/19  1432 10/12/19  0445 10/13/19  0524   WBC 6.7 5.2 4.2   HGB 14.0 12.4 12.2   HCT 45.3 38.8 40.0   PLT 137 112* 117*   MCV 88.6 87.2 89.4     No results for input(s): CHOLTOT, TRIG, HDL in the last 72 hours.    Invalid input(s): LIPIDCOMM, CHOLHDL, VLDCHOL, LDL  Recent Labs     10/11/19  1711   INR 1.03     Recent Labs     10/11/19  1432   TROPONINI <0.01     No results for input(s): BNP in the last 72 hours.  No results for input(s): NTPROBNP in the last 72 hours.  No results for input(s): TSH in the last 72 hours.    Imaging:       Assessment & Plan:     1.  Hypotension:  It is most like due to dehydration, cannot exclude concomitant infection (URI, rule out Covid).  He has now resolved.  ??  -Cw normal saline at 125 mL/h  -Please check for Covid  ??  2.  Sinus bradycardia:  It is most likely due to severe AKI in the setting of beta-blockers and clonidine.  It has now resolved    ??  3. Chronic HFpEF:   Patient was hypovolemic and hypotensive.  BP is now mildly elevated.  Kidney function improving.    -Continue with IV fluids today, likely stop fluids tomorrow.    4.  Essential hypertension:  We will start low-dose Coreg 3.125 twice daily today.  ??        I have spent 30 minutes of face to face time with the patient with more than 50% spent counseling and coordinating care.     I have personally reviewed the reports and images of labs, radiological studies, cardiac studies including ECG's and telemetry, current and old  medical records. The note was completed using EMR and Dragon dictation system. Every effort was made to ensure accuracy; however, inadvertent computerized transcription errors may be present.    All questions and concerns were addressed to the patient/family. Alternatives to my  treatment were discussed.     Thank you for allowing to [name] to participate in the care or Twisha A Kubin. Please call our service with questions.    Hervey Ard, MD, [facility], [place]  [facility]  [address]  [place] [address]  Ph: [phone]  Fax: [phone]

## 2019-10-13 NOTE — Plan of Care (Signed)
Increase functional status to baseline

## 2019-10-13 NOTE — Care Coordination-Inpatient (Signed)
Physical Therapy  Cancellation/No-show Note  Patient Name:  Gabrielle Wells  DOB:  Jan 16, 1951   Date:  10/13/2019  MRN: [id number]  Cancelled visits to date: 0  No-shows to date: 0    For today's appointment patient:  []   Cancelled  []   Rescheduled appointment  [x]   No-show     Reason given by patient:  []   Patient ill  []   Conflicting appointment  []   No transportation    []   Conflict with work  []   No reason given  []   Other:     Comments:   Pt in hospital    Electronically signed by:  Trula Ore, PT

## 2019-10-13 NOTE — Care Coordination-Inpatient (Signed)
CM following, pt has CT abd ordered, creat improved to 1.6 today.  Pending PT OT evals for DC recs.  Pt on IVF.  Cardiology following.  Electronically signed by Vincent Peyer, RN on 10/13/2019 at 11:06 AM  [phone]

## 2019-10-13 NOTE — Progress Notes (Signed)
Physical Therapy    Facility/Department: [facility]GERY  Initial Assessment    NAME: Gabrielle Wells  DOB: Sep 11, 1950  MRN: [id number]    Date of Service: 10/13/2019    Discharge Recommendations:[name] scored a 21/24 on the AM-PAC short mobility form. Current research shows that an AM-PAC score of 18 or greater is typically associated with a discharge to the patient's home setting. Based on the patient's AM-PAC score and their current functional mobility deficits, it is recommended that the patient have 2-3 sessions per week of Physical Therapy at d/c to increase the patient's independence.  At this time, this patient demonstrates the endurance and safety to discharge home with  (home  services) and a follow up treatment frequency of 2-3x/wk.  Please see assessment section for further patient specific details.    HOME HEALTH CARE: LEVEL 1 STANDARD    - Initial home health evaluation to occur within 24-48 hours, in patient home   - Therapy to evaluate with goal of regaining prior level of functioning   - Therapy to evaluate if patient has [place] needs for personal care    If patient discharges prior to next session this note will serve as a discharge summary.  Please see below for the latest assessment towards goals.         PT Equipment Recommendations  Equipment Needed: No    Assessment   Assessment: 69 yo admitted 10/11/19 for AKI/hypotension/bradycardia. Pt demo mobility at or close to her reported baseline of independent at home with rolling walker and assist PRN per family. Pt plans to return home at discharge. If home, recommend 24 hour supervision iniitally, home PT, start of COA services, use of rolling walker (has).  Treatment Diagnosis: mobility impairment due to hypotension  Decision Making: Medium Complexity  Patient Education: Pt educated on PT role, importance of OOB mobility, need to call for assist to get  up and she verbalized understanding.  REQUIRES PT FOLLOW UP:  Yes  Activity Tolerance  Activity Tolerance: Patient Tolerated treatment well;Patient limited by endurance;Patient limited by pain       Patient Diagnosis(es): The encounter diagnosis was AKI (acute kidney injury) ([place]).     has a past medical history of CHF (congestive heart failure) ([place]), Epidural abscess, GERD (gastroesophageal reflux disease), HTN (hypertension), IBS (irritable bowel syndrome), Lumbar radiculopathy, Morbid obesity ([place]), Muscle fasciculation, Neuropathic pain, SBO (small bowel obstruction) ([place]), Spinal fracture of T8 vertebra ([place]), Surgical site infection, and Urinary incontinence.   has a past surgical history that includes Appendectomy; Breast lumpectomy (Bilateral); Hysterectomy, total abdominal; Cholecystectomy; Total knee arthroplasty (Right); and Cardiac catheterization (2012).    Restrictions  Position Activity Restriction  Other position/activity restrictions: up with assist     Vision/Hearing  Vision: Within Functional Limits  Hearing: Within functional limits       Subjective  General  Chart Reviewed: Yes  Additional Pertinent Hx: 69 y.o. female with PMH of CKD, HFpEF, HTN, morbid obesity, and GERD who presented to ED 10/11/19 for  dizziness. Work up positive for symptomatic bradycardia/hypotension/AKI. CXR adn LS XRs both neg; cardio consult.  Family / Caregiver Present: No  Diagnosis: AKI/hypotension  Follows Commands: Within Functional Limits  Subjective  Subjective: Pt found up in chair and agreeable to PT. " I just want to go home. "  Pain Screening  Patient Currently in Pain: Yes(abd pain-level 6)          Orientation  Orientation  Overall Orientation Status:  Within Functional Limits     Social/Functional History  Social/Functional History  Lives With: (with brother and sister in law)  Type of Home: House  Home Layout: Two level(stays in basement)  Home Access: (through garage no steps to living area, up 14 steps if snowing)  Bathroom Shower/Tub: Civil engineer, contracting with back, Scientist, clinical (histocompatibility and immunogenetics): Handicap height(sink next to commode)  Home Equipment: Rolling walker, Secondary school teacher, Sock aid, Long-handled shoehorn  Receives Help From: Family(states she has home care set up but it hadn't started yet)  ADL Assistance: (sometimes help pulling up pants in the back, donning bra to go out)  Homemaking Assistance: (family brings meals to pt, pt does own laundry and cleaning)  Ambulation Assistance: Independent(indep in house, 2 wheeled walker outside)  Transfer Assistance: Independent  Active Driver: No  Leisure & Hobbies: going to church  Additional Comments: brother takes pt to appointments       Objective          AROM RLE (degrees)  RLE AROM: WFL(limited by body habitus)  AROM LLE (degrees)  LLE AROM : WFL(limited by body habitus)     Strength RLE  Strength RLE: WFL  Strength LLE  Strength LLE: WFL           Transfers  Sit to Stand: Modified independent(x3 trials; effortful)  Stand to sit: Modified independent(x3 trials; effortful)     Ambulation  Device: Conservation officer, nature  Assistance: Supervision  Quality of Gait: forward posture with slow cadence; decreased step length/height; DOE  Distance: 25 ft, 10 ft  Stairs/Curb  Stairs?: (up/down one step with rail SBA; very effortful; pt DOE)              Plan   Plan  Times per week: 2-5  Current Treatment Recommendations: Strengthening, Training and development officer, Personnel officer, Functional Mobility Training  Safety Devices  Type of devices: Bed alarm in place, Nurse notified, Call light within reach, Left in bed             AM-PAC Score  AM-PAC Inpatient Mobility Raw Score : 21 (10/13/19 1217)  AM-PAC Inpatient T-Scale Score : 50.25 (10/13/19 1217)  Mobility Inpatient CMS 0-100% Score: 28.97 (10/13/19 1217)  Mobility Inpatient CMS G-Code Modifier : CJ (10/13/19 1217)          Goals  Short term goals  Time Frame for Short term goals: discharge  Short term goal 1: ambulate 50 ft with rolling walker supervision  Short term goal 2: independent B LE HEP x10  Patient  Goals   Patient goals : return home       Therapy Time   Individual Concurrent Group Co-treatment   Time In 1108         Time Out 1208         Minutes 60           Timed Code Treatment Minutes: 50      Total Treatment Minutes:  [place], PT

## 2019-10-14 ENCOUNTER — Inpatient Hospital Stay: Payer: MEDICARE | Primary: Internal Medicine

## 2019-10-14 LAB — CBC WITH AUTO DIFFERENTIAL
Basophils %: 0.4 %
Basophils Absolute: 0 10*3/uL (ref 0.0–0.2)
Eosinophils %: 1.2 %
Eosinophils Absolute: 0 10*3/uL (ref 0.0–0.6)
Hematocrit: 37.4 % (ref 36.0–48.0)
Hemoglobin: 11.9 g/dL — ABNORMAL LOW (ref 12.0–16.0)
Lymphocytes %: 41.8 %
Lymphocytes Absolute: 1.6 10*3/uL (ref 1.0–5.1)
MCH: 27.6 pg (ref 26.0–34.0)
MCHC: 31.7 g/dL (ref 31.0–36.0)
MCV: 87.1 fL (ref 80.0–100.0)
MPV: 9.5 fL (ref 5.0–10.5)
Monocytes %: 25 %
Monocytes Absolute: 1 10*3/uL (ref 0.0–1.3)
Neutrophils %: 31.6 %
Neutrophils Absolute: 1.2 10*3/uL — ABNORMAL LOW (ref 1.7–7.7)
RBC: 4.3 M/uL (ref 4.00–5.20)
WBC: 3.9 10*3/uL — ABNORMAL LOW (ref 4.0–11.0)

## 2019-10-14 LAB — URINALYSIS WITH REFLEX TO CULTURE
Bilirubin Urine: NEGATIVE
Glucose, Ur: NEGATIVE mg/dL
Ketones, Urine: NEGATIVE mg/dL
Nitrite, Urine: POSITIVE — AB
Protein, UA: 100 mg/dL — AB
Specific Gravity, UA: 1.025 (ref 1.005–1.030)
Urobilinogen, Urine: 0.2 E.U./dL (ref <2.0)
pH, UA: 6 (ref 5.0–8.0)

## 2019-10-14 LAB — BASIC METABOLIC PANEL W/ REFLEX TO MG FOR LOW K
Anion Gap: 6 (ref 3–16)
BUN: 14 mg/dL (ref 7–20)
CO2: 26 mmol/L (ref 21–32)
Calcium: 8.6 mg/dL (ref 8.3–10.6)
Chloride: 108 mmol/L (ref 99–110)
Creatinine: 1.2 mg/dL (ref 0.6–1.2)
GFR African American: 54 — AB (ref >60)
GFR Non-African American: 44 — AB (ref >60)
Glucose: 95 mg/dL (ref 70–99)
Potassium reflex Magnesium: 4.5 mmol/L (ref 3.5–5.1)
Sodium: 140 mmol/L (ref 136–145)

## 2019-10-14 LAB — MICROSCOPIC URINALYSIS: RBC, UA: >100 /HPF — AB (ref 0–4)

## 2019-10-14 MED ORDER — CEFTRIAXONE SODIUM 1 G IJ SOLR
1 g | INTRAMUSCULAR | Status: DC
Start: 2019-10-14 — End: 2019-10-15
  Administered 2019-10-14 – 2019-10-15 (×2): 1000 mg via INTRAVENOUS

## 2019-10-14 MED ORDER — AMLODIPINE BESYLATE 5 MG PO TABS
5 MG | Freq: Every day | ORAL | Status: DC
Start: 2019-10-14 — End: 2019-10-14
  Administered 2019-10-14: 16:00:00 5 mg via ORAL

## 2019-10-14 MED ORDER — HYDRALAZINE HCL 20 MG/ML IJ SOLN
20 MG/ML | INTRAMUSCULAR | Status: DC | PRN
Start: 2019-10-14 — End: 2019-10-15
  Administered 2019-10-14 – 2019-10-15 (×2): 10 mg via INTRAVENOUS

## 2019-10-14 MED ORDER — AMLODIPINE BESYLATE 5 MG PO TABS
5 MG | Freq: Once | ORAL | Status: AC
Start: 2019-10-14 — End: 2019-10-14
  Administered 2019-10-14: 23:00:00 5 mg via ORAL

## 2019-10-14 MED ORDER — MIDAZOLAM HCL 2 MG/2ML IJ SOLN
2 | INTRAMUSCULAR | Status: AC
Start: 2019-10-14 — End: 2019-10-14

## 2019-10-14 MED ORDER — FENTANYL CITRATE (PF) 100 MCG/2ML IJ SOLN
100 | INTRAMUSCULAR | Status: AC
Start: 2019-10-14 — End: 2019-10-14

## 2019-10-14 MED ORDER — AMLODIPINE BESYLATE 10 MG PO TABS
10 MG | Freq: Every day | ORAL | Status: DC
Start: 2019-10-14 — End: 2019-10-15
  Administered 2019-10-15: 14:00:00 10 mg via ORAL

## 2019-10-14 MED FILL — AMLODIPINE BESYLATE 5 MG PO TABS: 5 mg | ORAL | Qty: 1

## 2019-10-14 MED FILL — MIDAZOLAM HCL 2 MG/2ML IJ SOLN: 2 mg/mL | INTRAMUSCULAR | Qty: 2

## 2019-10-14 MED FILL — HYDRALAZINE HCL 20 MG/ML IJ SOLN: 20 mg/mL | INTRAMUSCULAR | Qty: 1

## 2019-10-14 MED FILL — CITALOPRAM HYDROBROMIDE 40 MG PO TABS: 40 mg | ORAL | Qty: 1

## 2019-10-14 MED FILL — VITAMIN D3 25 MCG (1000 UT) PO TABS: 25 MCG (1000 UT) | ORAL | Qty: 2

## 2019-10-14 MED FILL — CARVEDILOL 3.125 MG PO TABS: 3.125 mg | ORAL | Qty: 1

## 2019-10-14 MED FILL — HYDROCODONE-ACETAMINOPHEN 5-325 MG PO TABS: 5-325 mg | ORAL | Qty: 1

## 2019-10-14 MED FILL — GABAPENTIN 300 MG PO CAPS: 300 mg | ORAL | Qty: 1

## 2019-10-14 MED FILL — ACETAMINOPHEN 325 MG PO TABS: 325 mg | ORAL | Qty: 2

## 2019-10-14 MED FILL — HEPARIN SODIUM (PORCINE) 5000 UNIT/ML IJ SOLN: 5000 [IU]/mL | INTRAMUSCULAR | Qty: 1

## 2019-10-14 MED FILL — ONDANSETRON HCL 4 MG/2ML IJ SOLN: 4 MG/2ML | INTRAMUSCULAR | Qty: 2

## 2019-10-14 MED FILL — POLYETHYLENE GLYCOL 3350 17 G PO PACK: 17 g | ORAL | Qty: 1

## 2019-10-14 MED FILL — HYDROXYZINE HCL 10 MG PO TABS: 10 mg | ORAL | Qty: 1

## 2019-10-14 MED FILL — FENTANYL CITRATE (PF) 100 MCG/2ML IJ SOLN: 100 MCG/2ML | INTRAMUSCULAR | Qty: 2

## 2019-10-14 MED FILL — CEFTRIAXONE SODIUM 1 G IJ SOLR: 1 g | INTRAMUSCULAR | Qty: 1000

## 2019-10-14 MED FILL — PANTOPRAZOLE SODIUM 40 MG PO TBEC: 40 mg | ORAL | Qty: 1

## 2019-10-14 NOTE — Progress Notes (Signed)
Foley out at 1045. Balloon deflated with 10cc syringe, output 350- rusty color with a couple small blood clots from accidental pulling at foley from pt. Urine samples sent to lab. Pt still complaining of urethral pain, MD notified, will continue with plan of care.

## 2019-10-14 NOTE — Progress Notes (Signed)
EMR was reviewed.    -Continue with carvedilol 3.125 twice daily and amlodipine 5 daily.  -Patient may be discharged home today if BP controlled and OK with primary team on the above meds and follow up with me in 1 week with a BMP prior to visit. Please call me with any questions.

## 2019-10-14 NOTE — Progress Notes (Signed)
Pt a/o x4, BP elevated- hydralazine given PRN throughout shift per orders. MD aware. Pt continues to have complaints of headache and burning/urgency with urination. Pt has had two BM's today and has been up to chair/bathroom and walked in room today. All needs met at this time, will continue with plan of care

## 2019-10-14 NOTE — Plan of Care (Signed)
Problem: Falls - Risk of:  Goal: Will remain free from falls  Description: Will remain free from falls  Outcome: Ongoing  Note: Pt remains free from falls through this shift, alert and oriented x4, fall protocol in place, bed alarm on and in low, locked position, side rails elevated x2, nonskid footwear on. Call light and pt belongings within reach, uses call light appropriately, no attempts to get oob without assistance.  Will continue to monitor for pt safety.      Problem: Fluid Volume:  Goal: Signs and symptoms of dehydration will decrease  Description: Signs and symptoms of dehydration will decrease  Note: Pts BP remains slightly elevated this shift. Pt has no complaints of dizziness or light headedness. Pt is receiving NS at 178mL/hr. Foley catheter in place with appropriate output. Will continue to monitor.      Problem: Pain:  Description: Pain management should include both nonpharmacologic and pharmacologic interventions.  Goal: Pain level will decrease  Description: Pain level will decrease  Outcome: Ongoing  Note: Pt complained of right sided abdominal pain this shift. Pt medicated with PRN norco as ordered. Pt resting comfortably at this time. Will continue to monitor.

## 2019-10-14 NOTE — Progress Notes (Signed)
Internal Medicine  PGY-1   Progress Note     CC: dizziness    HistoryObtained From:  patient    Interval HX:    No acute events overnight.    Pt seen at bedside.  And is having difficulty going to the bathroom today.  She is trying MiraLAX.  Patient also endorses some pain with urination although she does have a Foley inserted.  Today we will remove the Foley, attempt a voiding trial and also get a UA for possible UTI.  She continues to endorse the right lower quadrant abdominal pain, described as constant, dull and nonradiating.  Review of systems is negative for fevers, chills, chest pain, abdominal pain, nausea and vomiting.    HISTORY OF PRESENT ILLNESS:  Gabrielle Wells is a 69 y.o. female with PMH of CKD, HFpEF, HTN, morbid obesity, and GERD who p/w dizziness.  She started to feel fatigued and unwell yesterday but was able to ambulate and move around with shortness of breath on exertion.  She decided to see her PCP (Dr. Trilby Drummer) at the resident clinic for her symptoms.  At the clinic, she presented with generalized weakness, fatigue, and was diaphoretic.  She states that when he stands up, she gets dizzy and feels as though she is about to pass out.  She has not had a syncopal episode or endorses any vertigo.  She also endorses chronic back pain that seems to have been worsening since yesterday.  She has a history of lumbar pars fracture and back surgery.  She is compliant with her medications. She denies any shortness of breath, abdominal pain, extremity swelling, chest pain, fever, cough, chills or changes in bowel/urinary movements.     ED course: Patient presented diaphoretic and pale.  She was hypotensive in the 80s SBP and bradycardic with HR 55.  Lab workup was significant for elevated Cr 3.3, elevated pro-BNP 202, and negative troponin levels.  EKG revealed T-wave inversions in leads II, III and aVF with no ST segment elevations.  She was given 500 cc LR and shown much improvement of her symptoms.  CXR  was negative for findings.    Past Medical History:        Diagnosis Date   ??? CHF (congestive heart failure) ([place])    ??? 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 GERD (gastroesophageal reflux disease)    ??? HTN (hypertension)    ??? IBS (irritable bowel syndrome)    ??? Lumbar radiculopathy    ??? Morbid obesity ([place])    ??? Muscle fasciculation    ??? Neuropathic pain     back 2/2 to spinal surgeries   ??? SBO (small bowel obstruction) ([place]) 07/1999    with partial colectomy   ??? Spinal fracture of T8 vertebra ([facility])     s/p fusion    ??? Surgical site infection     back, spinal hardware    ??? Urinary incontinence        Past Surgical History:        Procedure Laterality Date   ??? APPENDECTOMY     ??? BREAST LUMPECTOMY Bilateral     benign lesions   ??? CARDIAC CATHETERIZATION  2012   ??? CHOLECYSTECTOMY     ??? HYSTERECTOMY, TOTAL ABDOMINAL     ??? TOTAL KNEE ARTHROPLASTY Right        Medications Prior to Admission:    Medications Prior to Admission: metoprolol succinate (TOPROL XL) 25  MG extended release tablet, Take 25 mg by mouth daily   loperamide (IMODIUM) 2 MG capsule, Take 2 mg by mouth 4 times daily as needed for Diarrhea  omeprazole (PRILOSEC) 20 MG delayed release capsule, Take 1 capsule by mouth every morning (before breakfast)  valsartan (DIOVAN) 80 MG tablet, Take 1 tablet by mouth daily  cloNIDine (CATAPRES) 0.2 MG tablet, Take 0.2 mg by mouth 2 times daily  gabapentin (NEURONTIN) 300 MG capsule, Take 300 mg by mouth 2 times daily.  vitamin D3 (CHOLECALCIFEROL) 25 MCG (1000 UT) TABS tablet, Take 2,000 Units by mouth daily   citalopram (CELEXA) 40 MG tablet, Take 40 mg by mouth daily  furosemide (LASIX) 80 MG tablet, Take 80 mg by mouth daily  potassium chloride (KLOR-CON M) 20 MEQ TBCR extended release tablet, Take 40 mEq by mouth 2 times daily  ??   Allergies:  Betadine [povidone iodine], Coconut oil, Codeine, Fish-derived products, Ibuprofen, Iodine, Other, and Sulfa antibiotics    Social History:    ?? TOBACCO: reports that she has never smoked. She has never used smokeless tobacco.  ?? ETOH:   reports no history of alcohol use.  ?? DRUGS : no use  ?? Patient currently lives alone    Family History:   History reviewed. No pertinent family history.    Review of Systems: A 10 point review of systems was conducted, significant findings as notedin HPI.    Physical Exam  Constitutional:       Appearance: Normal appearance. She is morbidly obese.   HENT:      Head: Normocephalic and atraumatic.      Mouth/Throat:      Mouth: Mucous membranes are moist.      Pharynx: Oropharynx is clear.   Eyes:      Conjunctiva/sclera: Conjunctivae normal.      Pupils: Pupils are equal, round, and reactive to light.   Neck:      Musculoskeletal: Normal range of motion and neck supple.   Cardiovascular:      Rate and Rhythm: Regular rhythm. Bradycardia present.      Pulses: Normal pulses.      Heart sounds: Normal heart sounds.   Pulmonary:      Effort: Pulmonary effort is normal.      Breath sounds: Normal breath sounds.   Abdominal:      General: Bowel sounds are normal. There is distension.      Palpations: Abdomen is soft. There is no mass.      Tenderness: There is abdominal tenderness.      Comments: Patient very sensitive to deep palpation of the abdomen she has rashes from hernias.  Patient very sensitive to deep palpation in right lower quadrant and has suprapubic tenderness.   Musculoskeletal:         General: Tenderness (lumbar spine) present.      Right lower leg: Edema (1+) present.      Left lower leg: Edema (1+) present.   Skin:     General: Skin is warm and dry.      Capillary Refill: Capillary refill takes less than 2 seconds.   Neurological:      General: No focal deficit present.      Mental Status: She is alert and oriented to person, place, and time. Mental status is at baseline.          Vitals:    10/14/19 0950   BP: (!) 202/90   Pulse:    Resp:  Temp:    SpO2:        DATA:    Labs:  BMP:   Recent Labs      10/12/19  1722 10/13/19  0524 10/14/19  0523   NA 138 137 140   K 3.9 4.4 4.5   CL 107 105 108   CO2 24 20* 26   BUN 28* 24* 14   CREATININE 1.9* 1.6* 1.2   GLUCOSE 111* 83 95     CBC:   Recent Labs     10/12/19  0445 10/13/19  0524 10/14/19  0523   WBC 5.2 4.2 3.9*   HGB 12.4 12.2 11.9*   HCT 38.8 40.0 37.4   PLT 112* 117* 114*       LFT's: No results for input(s): AST, ALT, ALB, BILITOT, ALKPHOS in the last 72 hours.  Troponin:   Recent Labs     10/11/19  1432   TROPONINI <0.01     BNP: No results for input(s): BNP in the last 72 hours.  ABGs: No results for input(s): PHART, PCO2ART, PO2ART in the last 72 hours.  INR:   Recent Labs     10/11/19  1711   INR 1.03       U/A:  Recent Labs     10/11/19  2135   COLORU Yellow   PHUR 6.0   WBCUA 3-5   RBCUA 0-2   CLARITYU Clear   SPECGRAV >=1.030   LEUKOCYTESUR TRACE*   UROBILINOGEN 0.2   BILIRUBINUR Negative   BLOODU Negative   GLUCOSEU Negative       CT ABDOMEN PELVIS WO CONTRAST   Final Result      3.3 x 3.0 cm round low-attenuation lesion of the lateral upper pole of the right kidney, consistent with benign renal cortical cysts.      Diverticulosis of the distal colon.      No other acute findings in the abdomen or pelvis.      US RENAL COMPLETE   Final Result   IMPRESSION :      Indeterminate hypoechoic lesion in the right upper kidney requires further evaluation as detailed above.      No hydronephrosis.         XR LUMBAR SPINE (2-3 VIEWS)   Final Result   Impression: No acute osseous abnormality.      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      XR CHEST PORTABLE   Final Result      Clear lungs.      Normal cardiac mediastinal silhouette      Elevated right hemidiaphragm.      Right shoulder arthroplasty noted.          ASSESSMENT AND PLAN:  Wells Wells is a 69 y.o. female who presents with dizziness and was admitted for symptomatic bradycardia and AKI.    # Dizziness 2/2 symptomatic bradycardia vs dehydration  Poor po intake; EKG  revealed sinus bradycardia w/ T-wave inversions. No recent cardiology follow ups; must r/o medication side effects   - stop IVF  - holding home metoprolol  - orthostatics negative  -OT 21/24, PT 21/24  - cardiology consult; appreciate recs    # Sinus bradycardia - improving  Likely 2/2 severe AKI in setting of BB @ clonidine per cards  -Holding metoprolol but restarted on low-dose Coreg    #Abdominal pain  -CT abdomen pelvis revealed 3.3 x 3.0 cm round low-attenuation lesion of the  lateral upper pole of the right kidney, consistent with benign renal cortical cysts. Diverticulosis of the distal colon.     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  BUN:Cr less than 20.  Likely secondary to retention   - Bladder scan pt retaining after voiding, inserted foley, Cr improving 1.6 -> 1.2  -Voiding trial today  -Stop IV fluids  - urine analysis dirty with 21-50 epithelial cells. There is trace leukocyte esterase and trace protein.  - holding home diuretics  - renal ultrasound revealed indeterminate hypoechoic lesion in the right upper kidney requires further evaluation as detailed above.   -Avoid nephrotoxic agents and contrast if possible  -Consider nephrology consult    Suprapubic tenderness and dysuria  -Remove Foley  - Voiding trial today  -UA with reflex to culture    # Thrombocytopenia   - PLTs 112 -> 117 -> 114    # Chronic lumbar back pain  History of lumbar pars fracture, lumbar radiculopathy and epidural abscess. No follow up lumbar XR s/p I&D.  - XR lumbar spine revealed moderate to severe facet arthropathy in the lower lumbar spine.  Grade 1 retrolisthesis L2 on L3.  - continue home gabapentin    Hypertension - holding valsartan and furosemide secondary to AKI.  Holding metoprolol secondary to bradycardia.  -Not well controlled  -Cards discontinued hydralazine and started Coreg 3.125 mg twice daily  -Started Norvasc 5 mg daily  -As needed hydralazine 10 mg IV every 4 hours for  systolic blood pressure greater than 180  GERD - continue protonix  Morbid Obesity: Body mass index is 52.49 kg/m??.. Complicating assessment and treatment. Placing patient at risk for multiple co-morbidities as well as early death and contributing to the patient's presentation. Counseled on diet, weight loss    Code Status: Full Code  FEN: DIET CARDIAC;  PPX: Heparin  DISPO: GMF, pending Cr, BP, possible discharge home tomorrow    This patient will be discussed with attending, Dr Vonzella Nipple.    Noreene Filbert DO, PGY- 1  10/14/2019,  11:18 AM

## 2019-10-14 NOTE — Progress Notes (Signed)
Pt complaining of burning at urethra and mild swelling. Communication to resident- orders to saline lock IV. Will continue with plan of care.

## 2019-10-14 NOTE — Care Coordination-Inpatient (Signed)
Cm following, cards cleared for DC with med changes.  FC removed for VT UA to be sent for poss UTI, pt taking Miralax to help with BM.  Pt plans to DC home with brother and [facility] [place] [facility] once medically stable for DC.   Electronically signed by Vincent Peyer, RN on 10/14/2019 at 3:43 PM  [phone]

## 2019-10-14 NOTE — Progress Notes (Signed)
Occupational Therapy  Facility/Department: [facility] S[facility] SURGERY  Daily Treatment Note  NAME: Gabrielle Wells  DOB: 1951/04/03  MRN: [id number]    Date of Service: 10/14/2019    Discharge Recommendations: Gabrielle Wells scored a 19/24 on the AM-PAC ADL Inpatient form. Current research shows that an AM-PAC score of 18 or greater is typically associated with a discharge to the patient's home setting. Based on the patient's AM-PAC score, and their current ADL deficits, it is recommended that the patient have 2-3 sessions per week of Occupational Therapy at d/c to increase the patient's independence.  At this time, this patient demonstrates the endurance and safety to discharge home with home services and a follow up treatment frequency of 2-3x/wk.   Please see assessment section for further patient specific details.    If patient discharges prior to next session this note will serve as a discharge summary.  Please see below for the latest assessment towards goals.     HOME HEALTH CARE: LEVEL 1 STANDARD    - Initial home health evaluation to occur within 24-48 hours, in patient home   - Therapy to evaluate with goal of regaining prior level of functioning   - Therapy to evaluate if patient has [place] needs for personal care       OT Equipment Recommendations  Other: Pt has been educated where to obtain the following, which would all be necessary to decrease burden of care: bedrail; AE (short reacher, toilet aide, [employer] shoe horn); bidet for toilet hygiene. Perhaps council of aging can assist to provide.    Assessment   Performance deficits / Impairments: Decreased functional mobility ;Decreased ADL status;Decreased ROM  Assessment: Pt moves well at SBA/SUP for functional mobility, but continues to have limited functional reach for LE ADLs & toilet hygiene. Toilet hygiene is a concern for re-admit w/ risk of reoccuring UTIs - pt educated on benefit of bidet but she is financially limited. Reports good support at  home from brother & SIL. Recommend home w/ 24 hr A, HHOT, and council on aging. Cont. per POC.  Treatment Diagnosis: impaired ADLs and transfers  Prognosis: Good  OT Education: OT Role;Transfer Training;Equipment;Energy Conservation;ADL Adaptive Strategies  Patient Education: verb understanding & agreement; therapist wrote AE/equipment recommendations on pt's handout in room; cont. to reinforce & problem-solve  REQUIRES OT FOLLOW UP: Yes  Activity Tolerance  Activity Tolerance: Patient Tolerated treatment well;Patient limited by fatigue  Activity Tolerance: Pt demonstrating DOE & reporting fatigue after all mobility; SpO2 = 97% on RA & HR = low 90s. Therapist assisted pt back to bed to rest.  Safety Devices  Safety Devices in place: Yes  Type of devices: Nurse notified;Call light within reach;Bed alarm in place;Left in bed         Patient Diagnosis(es): The encounter diagnosis was AKI (acute kidney injury) ([place]).      has a past medical history of CHF (congestive heart failure) ([place]), Epidural abscess, GERD (gastroesophageal reflux disease), HTN (hypertension), IBS (irritable bowel syndrome), Lumbar radiculopathy, Morbid obesity ([place]), Muscle fasciculation, Neuropathic pain, SBO (small bowel obstruction) ([place]), Spinal fracture of T8 vertebra ([place]), Surgical site infection, and Urinary incontinence.   has a past surgical history that includes Appendectomy; Breast lumpectomy (Bilateral); Hysterectomy, total abdominal; Cholecystectomy; Total knee arthroplasty (Right); and Cardiac catheterization (2012).    Restrictions  Position Activity Restriction  Other position/activity restrictions: up with assist  Subjective   General  Chart Reviewed: Yes  Patient assessed for rehabilitation services?:  Yes  Additional Pertinent Hx: PMH:  CHF, HTN, GERD, IBS, chole, sinal fx T8, s/p fusion, SBO, chole, R TKR  Response to previous treatment: Patient with no complaints from previous session  Family / Caregiver Present: No  Referring  Practitioner: Dr. Vonzella Nipple  Diagnosis: Pt admitted with fatigue, lightheadedness, SOB-dx of dizziness secondary symptomatic bradycardia vs dehydration, AKI on CKD IV-abd CT=diverticulosis  Subjective  Subjective: Pt in chair, wanting assistance w/ toilet hygiene, pleasant & agreeable to therapy. "I guess you're one of my daughters now. I don't let anyone mess with my daughters."  Intervention List: Patient able to continue with treatment;Nurse/Physician notified(pt returned to bed for rest)  Patient Currently in Pain: (pt demonstrating pain w/ urination (possible UTI); RN aware)   Orientation  Orientation  Overall Orientation Status: Within Functional Limits  Objective    ADL  Grooming: Supervision(washing hands sinkside in stance)  UE Dressing: (therapist assisted 2/2 time)  LE Dressing: Minimal assistance(demo'd use of sock aid w/out A; requires assist to doff socks; able to thread brief by placing on floor (w/ socks off); SBA for pants management)  Toileting: Moderate assistance(urine output on toilet w/ increased pain & difficulty; requires A for peri care 2/2 limited functional reach; able to manage pants)  Additional Comments: introduced reacher, pt's LE dressing performance improves w/ AE. She reports she has reacher at home that is too long and therefore not functional. Educated pt on where to acquire reacher, long-handled shoe horn prn, new toilet aides, & bidet for toilet hygiene. Pt verb understanding & agreement however reports she is financially limited.        Balance  Sitting Balance: Supervision  Standing Balance: Supervision  Standing Balance  Time: up to 2 min, multiple bouts  Activity: washing hands, pants management, room mobility  Functional Mobility  Functional - Mobility Device: Rolling Walker  Activity: To/from bathroom(two times for freq. bladder)  Assist Level: Supervision(SBA progressing to SUP)  Functional Mobility Comments: pt initiates safety, safe hand placement & good RW management  100% of time w/out VCs  Toilet Transfers  Toilet - Technique: Ambulating  Equipment Used: Standard toilet(grab bar)  Toilet Transfer: Stand by assistance(anticipate decreased effort w/ RTS per home set-up)  Bed mobility  Sit to Supine: Stand by assistance(HOB flat per home set-up; increased time & effort)  Scooting: Stand by assistance  Transfers  Sit to stand: Stand by assistance  Stand to sit: Stand by assistance                       Cognition  Overall Cognitive Status: WFL  Cognition Comment: requires min cues for safety (encouraging use of gait belt, not reaching too far for RW to stand & waiting for therapist to assist)                       LUE AROM (degrees)  LUE AROM : Exceptions  LUE General AROM: <half ROM 2/2 shoulder & back injury, elbow to hand=WFL  RUE AROM (degrees)  RUE AROM : Exceptions  RUE General AROM: <half ROM 2/2 shoulder & back injury, elbow to hand=WFL                 Plan   Plan  Times per week: 2-5  Times per day: Daily  Current Treatment Recommendations: Hotel manager, Therapist, nutritional, Endurance Training, Self-Care / ADL  G-Code     OutComes Score  AM-PAC Score        AM-PAC Inpatient Daily Activity Raw Score: 19 (10/14/19 1525)  AM-PAC Inpatient ADL T-Scale Score : 40.22 (10/14/19 1525)  ADL Inpatient CMS 0-100% Score: 42.8 (10/14/19 1525)  ADL Inpatient CMS G-Code Modifier : CK (10/14/19 1525)    Goals  Short term goals  Time Frame for Short term goals: discharge  Short term goal 1: pt to transfer to commode S/I - ongoing  Short term goal 2: pt to don depend brief with reacher and SBA - MET 2/25. New goal: pt to complete toilet hygiene at [facility] w/ adaptive techniques  Short term goal 3: pt to do functional tasks in room with S/SBA - ongoing  Patient Goals   Patient goals : to be indep       Therapy Time   Individual Concurrent Group Co-treatment   Time In 1426         Time Out 1513         Minutes [address], s/OT  Nira Conn Cassadee Vanzandt OTR/L #[id number]  Therapist was present, directed the patient's care, made skilled judgement, and was responsible for assessment and treatment of the patient.

## 2019-10-15 LAB — CBC WITH AUTO DIFFERENTIAL
Basophils %: 0.5 %
Basophils Absolute: 0 10*3/uL (ref 0.0–0.2)
Eosinophils %: 1.2 %
Eosinophils Absolute: 0.1 10*3/uL (ref 0.0–0.6)
Hematocrit: 39.7 % (ref 36.0–48.0)
Hemoglobin: 12.6 g/dL (ref 12.0–16.0)
Lymphocytes %: 33 %
Lymphocytes Absolute: 1.6 10*3/uL (ref 1.0–5.1)
MCH: 27.5 pg (ref 26.0–34.0)
MCHC: 31.8 g/dL (ref 31.0–36.0)
MCV: 86.3 fL (ref 80.0–100.0)
MPV: 9.8 fL (ref 5.0–10.5)
Monocytes %: 25.2 %
Monocytes Absolute: 1.2 10*3/uL (ref 0.0–1.3)
Neutrophils %: 40.1 %
Neutrophils Absolute: 2 10*3/uL (ref 1.7–7.7)
Platelets: 123 10*3/uL — ABNORMAL LOW (ref 135–450)
RBC: 4.59 M/uL (ref 4.00–5.20)
RDW: 15.9 % — ABNORMAL HIGH (ref 12.4–15.4)
WBC: 4.9 10*3/uL (ref 4.0–11.0)

## 2019-10-15 LAB — BASIC METABOLIC PANEL W/ REFLEX TO MG FOR LOW K
Anion Gap: 9 (ref 3–16)
BUN: 10 mg/dL (ref 7–20)
CO2: 27 mmol/L (ref 21–32)
Calcium: 9.4 mg/dL (ref 8.3–10.6)
Chloride: 104 mmol/L (ref 99–110)
Creatinine: 1.1 mg/dL (ref 0.6–1.2)
GFR African American: 59 — AB (ref >60)
GFR Non-African American: 49 — AB (ref >60)
Glucose: 91 mg/dL (ref 70–99)
Potassium reflex Magnesium: 4.3 mmol/L (ref 3.5–5.1)
Sodium: 140 mmol/L (ref 136–145)

## 2019-10-15 MED ORDER — CEFDINIR 300 MG PO CAPS
300 MG | ORAL_CAPSULE | Freq: Two times a day (BID) | ORAL | 0 refills | Status: AC
Start: 2019-10-15 — End: 2019-10-20

## 2019-10-15 MED ORDER — FUROSEMIDE 80 MG PO TABS
80 MG | ORAL_TABLET | Freq: Every day | ORAL | 3 refills | Status: DC
Start: 2019-10-15 — End: 2020-01-07

## 2019-10-15 MED ORDER — OXYCODONE HCL 5 MG PO TABS
5 MG | Freq: Once | ORAL | Status: AC | PRN
Start: 2019-10-15 — End: 2019-10-15
  Administered 2019-10-15: 18:00:00 5 mg via ORAL

## 2019-10-15 MED ORDER — FUROSEMIDE 40 MG PO TABS
40 MG | Freq: Every day | ORAL | Status: DC
Start: 2019-10-15 — End: 2019-10-15

## 2019-10-15 MED ORDER — CARVEDILOL 6.25 MG PO TABS
6.25 MG | Freq: Two times a day (BID) | ORAL | Status: DC
Start: 2019-10-15 — End: 2019-10-15

## 2019-10-15 MED ORDER — DIPHENHYDRAMINE HCL 50 MG/ML IJ SOLN
50 MG/ML | Freq: Once | INTRAMUSCULAR | Status: AC
Start: 2019-10-15 — End: 2019-10-15
  Administered 2019-10-15: 14:00:00 25 mg via INTRAVENOUS

## 2019-10-15 MED ORDER — HYDROMORPHONE HCL 1 MG/ML IJ SOLN
1 MG/ML | Freq: Once | INTRAMUSCULAR | Status: AC
Start: 2019-10-15 — End: 2019-10-15
  Administered 2019-10-15: 07:00:00 0.25 mg via INTRAVENOUS

## 2019-10-15 MED ORDER — BUTALBITAL-APAP-CAFFEINE 50-325-40 MG PO TABS
50-325-40 MG | Freq: Once | ORAL | Status: AC
Start: 2019-10-15 — End: 2019-10-15
  Administered 2019-10-15: 20:00:00 1 via ORAL

## 2019-10-15 MED ORDER — CARVEDILOL 3.125 MG PO TABS
3.125 MG | Freq: Once | ORAL | Status: AC
Start: 2019-10-15 — End: 2019-10-15
  Administered 2019-10-15: 16:00:00 3.125 mg via ORAL

## 2019-10-15 MED ORDER — PHENAZOPYRIDINE HCL 200 MG PO TABS
200 MG | Freq: Three times a day (TID) | ORAL | Status: DC
Start: 2019-10-15 — End: 2019-10-15
  Administered 2019-10-15: 18:00:00 200 mg via ORAL

## 2019-10-15 MED ORDER — DIPHENHYDRAMINE HCL 25 MG PO TABS
25 MG | Freq: Once | ORAL | Status: AC
Start: 2019-10-15 — End: 2019-10-15
  Administered 2019-10-15: 09:00:00 25 mg via ORAL

## 2019-10-15 MED ORDER — CARVEDILOL 6.25 MG PO TABS
6.25 MG | ORAL_TABLET | Freq: Two times a day (BID) | ORAL | 3 refills | Status: DC
Start: 2019-10-15 — End: 2019-12-08

## 2019-10-15 MED ORDER — FUROSEMIDE 80 MG PO TABS
80 MG | Freq: Every day | ORAL | Status: DC
Start: 2019-10-15 — End: 2019-10-15
  Administered 2019-10-15: 14:00:00 80 mg via ORAL

## 2019-10-15 MED ORDER — VALSARTAN 160 MG PO TABS
160 MG | Freq: Every day | ORAL | Status: DC
Start: 2019-10-15 — End: 2019-10-15
  Administered 2019-10-15: 14:00:00 80 mg via ORAL

## 2019-10-15 MED ORDER — PHENAZOPYRIDINE HCL 200 MG PO TABS
200 MG | ORAL_TABLET | Freq: Three times a day (TID) | ORAL | 0 refills | Status: AC
Start: 2019-10-15 — End: 2019-10-18

## 2019-10-15 MED ORDER — PROCHLORPERAZINE EDISYLATE 10 MG/2ML IJ SOLN
10 MG/2ML | Freq: Once | INTRAMUSCULAR | Status: AC
Start: 2019-10-15 — End: 2019-10-15
  Administered 2019-10-15: 14:00:00 10 mg via INTRAVENOUS

## 2019-10-15 MED FILL — GABAPENTIN 300 MG PO CAPS: 300 mg | ORAL | Qty: 1

## 2019-10-15 MED FILL — HEPARIN SODIUM (PORCINE) 5000 UNIT/ML IJ SOLN: 5000 [IU]/mL | INTRAMUSCULAR | Qty: 1

## 2019-10-15 MED FILL — BENADRYL ALLERGY 25 MG PO TABS: 25 mg | ORAL | Qty: 1

## 2019-10-15 MED FILL — HYDROXYZINE HCL 10 MG PO TABS: 10 mg | ORAL | Qty: 1

## 2019-10-15 MED FILL — BUTALBITAL-APAP-CAFFEINE 50-325-40 MG PO TABS: 50-325-40 mg | ORAL | Qty: 1

## 2019-10-15 MED FILL — CARVEDILOL 3.125 MG PO TABS: 3.125 mg | ORAL | Qty: 1

## 2019-10-15 MED FILL — AFLURIA QUADRIVALENT 0.5 ML IM SUSY: 0.5 mL | INTRAMUSCULAR | Qty: 0.5

## 2019-10-15 MED FILL — HYDRALAZINE HCL 20 MG/ML IJ SOLN: 20 mg/mL | INTRAMUSCULAR | Qty: 1

## 2019-10-15 MED FILL — PHENAZOPYRIDINE HCL 200 MG PO TABS: 200 mg | ORAL | Qty: 1

## 2019-10-15 MED FILL — CITALOPRAM HYDROBROMIDE 40 MG PO TABS: 40 mg | ORAL | Qty: 1

## 2019-10-15 MED FILL — DIPHENHYDRAMINE HCL 50 MG/ML IJ SOLN: 50 mg/mL | INTRAMUSCULAR | Qty: 1

## 2019-10-15 MED FILL — AMLODIPINE BESYLATE 10 MG PO TABS: 10 mg | ORAL | Qty: 1

## 2019-10-15 MED FILL — PANTOPRAZOLE SODIUM 40 MG PO TBEC: 40 mg | ORAL | Qty: 1

## 2019-10-15 MED FILL — CEFTRIAXONE SODIUM 1 G IJ SOLR: 1 g | INTRAMUSCULAR | Qty: 1000

## 2019-10-15 MED FILL — PROCHLORPERAZINE EDISYLATE 10 MG/2ML IJ SOLN: 10 MG/2ML | INTRAMUSCULAR | Qty: 2

## 2019-10-15 MED FILL — HYDROCODONE-ACETAMINOPHEN 5-325 MG PO TABS: 5-325 mg | ORAL | Qty: 1

## 2019-10-15 MED FILL — VALSARTAN 160 MG PO TABS: 160 mg | ORAL | Qty: 1

## 2019-10-15 MED FILL — VITAMIN D3 25 MCG (1000 UT) PO TABS: 25 MCG (1000 UT) | ORAL | Qty: 2

## 2019-10-15 MED FILL — ACETAMINOPHEN 325 MG PO TABS: 325 mg | ORAL | Qty: 2

## 2019-10-15 MED FILL — OXYCODONE HCL 5 MG PO TABS: 5 mg | ORAL | Qty: 1

## 2019-10-15 MED FILL — FUROSEMIDE 80 MG PO TABS: 80 mg | ORAL | Qty: 1

## 2019-10-15 MED FILL — HYDROMORPHONE HCL 1 MG/ML IJ SOLN: 1 mg/mL | INTRAMUSCULAR | Qty: 1

## 2019-10-15 NOTE — Progress Notes (Signed)
One time dose of dilaudid administered for headache. Pt called out after awhile complaining of itching. No hives present. Received orders for one time dose of benadryl administered.

## 2019-10-15 NOTE — Progress Notes (Signed)
Discharge order acknowledged and IV removed. Pt received discharge instructions/education at bedside and was escorted safely off unit around 1700    Signed Robyne Peers, RN

## 2019-10-15 NOTE — Progress Notes (Signed)
Pt remained A&Ox4 and VSS with exception to elevated BP, MD aware. Pt has reported constant, continuous headache throughout shift. MD aware, and Pt has received scheduled and PRN pain medication per orders throughout shift. Pt remains voiding freely and had 1 BM during shift. Pt has ambulated during shift, and tolerated ambulation well. Fall precautions placed and call light within reach. Will continue to monitor

## 2019-10-15 NOTE — Discharge Instructions (Signed)
Please follow up with Dr. Keenan Bachelor (cardiologist) in 1 week. Please have blood work drawn 2 days before then (BMP).  Please follow up with your PCP in 1 week.    Please note changes in medications:  1. Please take cefdinir 300 mg oral twice daily for 5 days.  2. Please take coreg 6.25 mg oral twice daily.  3. Please take lasix 40 mg oral daily (not home 80 mg)  4. Please STOP taking metoprolol.

## 2019-10-15 NOTE — Care Coordination-Inpatient (Addendum)
CM following pt cont to have HTN getting PRN meds, pt was given IV pain meds for headache.   +UTI, creat 1.1 improving.  Pt will Dc [place] [facility] for [facility].  Brother will take her home.  Electronically signed by Vincent Peyer, RN on 10/15/2019 at 10:36 AM  [phone]

## 2019-10-15 NOTE — Progress Notes (Signed)
Internal Medicine  PGY-1   Progress Note     CC: dizziness    HistoryObtained From:  patient    Interval HX:    Pt had 2 BMs yesterday. Pt had HA overnight, given dilaudid.    Pt seen at bedside.  Patient endorses a headache this morning without relief from the [place] and she reported that she was very itchy after receiving Dilaudid.  She endorses pain that is bilateral pressure-like sensation with some radiation into the neck.  She also continues to have some burning with urination and requested some Pyridium.  She dates that the belly pain that she had yesterday has been better since having the 2 bowel movements.  Review of systems is negative for fevers, chills, chest pain, abdominal pain, nausea and vomiting.    HISTORY OF PRESENT ILLNESS:  Gabrielle Wells is a 69 y.o. female with PMH of CKD, HFpEF, HTN, morbid obesity, and GERD who p/w dizziness.  She started to feel fatigued and unwell yesterday but was able to ambulate and move around with shortness of breath on exertion.  She decided to see her PCP (Dr. Trilby Drummer) at the resident clinic for her symptoms.  At the clinic, she presented with generalized weakness, fatigue, and was diaphoretic.  She states that when he stands up, she gets dizzy and feels as though she is about to pass out.  She has not had a syncopal episode or endorses any vertigo.  She also endorses chronic back pain that seems to have been worsening since yesterday.  She has a history of lumbar pars fracture and back surgery.  She is compliant with her medications. She denies any shortness of breath, abdominal pain, extremity swelling, chest pain, fever, cough, chills or changes in bowel/urinary movements.     ED course: Patient presented diaphoretic and pale.  She was hypotensive in the 80s SBP and bradycardic with HR 55.  Lab workup was significant for elevated Cr 3.3, elevated pro-BNP 202, and negative troponin levels.  EKG revealed T-wave inversions in leads II, III and aVF with no ST segment  elevations.  She was given 500 cc LR and shown much improvement of her symptoms.  CXR was negative for findings.    Past Medical History:        Diagnosis Date   ??? CHF (congestive heart failure) ([place])    ??? 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 GERD (gastroesophageal reflux disease)    ??? HTN (hypertension)    ??? IBS (irritable bowel syndrome)    ??? Lumbar radiculopathy    ??? Morbid obesity ([place])    ??? Muscle fasciculation    ??? Neuropathic pain     back 2/2 to spinal surgeries   ??? SBO (small bowel obstruction) ([place]) 07/1999    with partial colectomy   ??? Spinal fracture of T8 vertebra (HCC)     s/p fusion    ??? Surgical site infection     back, spinal hardware    ??? Urinary incontinence        Past Surgical History:        Procedure Laterality Date   ??? APPENDECTOMY     ??? BREAST LUMPECTOMY Bilateral     benign lesions   ??? CARDIAC CATHETERIZATION  2012   ??? CHOLECYSTECTOMY     ??? HYSTERECTOMY, TOTAL ABDOMINAL     ??? TOTAL KNEE ARTHROPLASTY Right        Medications Prior to  Admission:    Medications Prior to Admission: metoprolol succinate (TOPROL XL) 25 MG extended release tablet, Take 25 mg by mouth daily   loperamide (IMODIUM) 2 MG capsule, Take 2 mg by mouth 4 times daily as needed for Diarrhea  omeprazole (PRILOSEC) 20 MG delayed release capsule, Take 1 capsule by mouth every morning (before breakfast)  valsartan (DIOVAN) 80 MG tablet, Take 1 tablet by mouth daily  cloNIDine (CATAPRES) 0.2 MG tablet, Take 0.2 mg by mouth 2 times daily  gabapentin (NEURONTIN) 300 MG capsule, Take 300 mg by mouth 2 times daily.  vitamin D3 (CHOLECALCIFEROL) 25 MCG (1000 UT) TABS tablet, Take 2,000 Units by mouth daily   citalopram (CELEXA) 40 MG tablet, Take 40 mg by mouth daily  furosemide (LASIX) 80 MG tablet, Take 80 mg by mouth daily  potassium chloride (KLOR-CON M) 20 MEQ TBCR extended release tablet, Take 40 mEq by mouth 2 times daily  ??   Allergies:  Betadine [povidone iodine], Coconut oil, Codeine,  Fish-derived products, Ibuprofen, Iodine, Other, and Sulfa antibiotics    Social History:   ?? TOBACCO: reports that she has never smoked. She has never used smokeless tobacco.  ?? ETOH:   reports no history of alcohol use.  ?? DRUGS : no use  ?? Patient currently lives alone    Family History:   History reviewed. No pertinent family history.    Review of Systems: A 10 point review of systems was conducted, significant findings as notedin HPI.    Physical Exam  Constitutional:       Appearance: Normal appearance. She is morbidly obese.   HENT:      Head: Normocephalic and atraumatic.      Mouth/Throat:      Mouth: Mucous membranes are moist.      Pharynx: Oropharynx is clear.   Eyes:      Conjunctiva/sclera: Conjunctivae normal.      Pupils: Pupils are equal, round, and reactive to light.   Neck:      Musculoskeletal: Normal range of motion and neck supple.   Cardiovascular:      Rate and Rhythm: Normal rate and regular rhythm.      Pulses: Normal pulses.      Heart sounds: Normal heart sounds. No murmur. No gallop.    Pulmonary:      Effort: Pulmonary effort is normal. No respiratory distress.      Breath sounds: Normal breath sounds. No wheezing or rales.   Abdominal:      General: Bowel sounds are normal. There is no distension.      Palpations: Abdomen is soft. There is no mass.      Tenderness: There is abdominal tenderness.      Comments: Patient very sensitive to deep palpation of the abdomen she has rashes from hernias.  Patient very sensitive to deep palpation in right lower quadrant and has suprapubic tenderness.   Musculoskeletal:         General: Tenderness (lumbar spine) present.      Right lower leg: Edema (1+) present.      Left lower leg: Edema (1+) present.   Skin:     General: Skin is warm and dry.      Capillary Refill: Capillary refill takes less than 2 seconds.   Neurological:      General: No focal deficit present.      Mental Status: She is alert and oriented to person, place, and time. Mental  status is at baseline.  Vitals:    10/15/19 1104   BP: (!) 144/83   Pulse: 76   Resp: 16   Temp: 98.8 ??F (37.1 ??C)   SpO2: 96%       DATA:    Labs:  BMP:   Recent Labs     10/13/19  0524 10/14/19  0523 10/15/19  0542   NA 137 140 140   K 4.4 4.5 4.3   CL 105 108 104   CO2 20* 26 27   BUN 24* 14 10   CREATININE 1.6* 1.2 1.1   GLUCOSE 83 95 91     CBC:   Recent Labs     10/13/19  0524 10/14/19  0523 10/15/19  0542   WBC 4.2 3.9* 4.9   HGB 12.2 11.9* 12.6   HCT 40.0 37.4 39.7   PLT 117* 114* 123*       LFT's: No results for input(s): AST, ALT, ALB, BILITOT, ALKPHOS in the last 72 hours.  Troponin:   No results for input(s): TROPONINI in the last 72 hours.  BNP: No results for input(s): BNP in the last 72 hours.  ABGs: No results for input(s): PHART, PCO2ART, PO2ART in the last 72 hours.  INR:   No results for input(s): INR in the last 72 hours.    U/A:  Recent Labs     10/14/19  1213   COLORU Yellow   PHUR 6.0   WBCUA 51-100*   RBCUA >100*   BACTERIA 2+*   CLARITYU Clear   SPECGRAV 1.025   LEUKOCYTESUR SMALL*   UROBILINOGEN 0.2   BILIRUBINUR Negative   BLOODU LARGE*   GLUCOSEU Negative       CT ABDOMEN PELVIS WO CONTRAST   Final Result      3.3 x 3.0 cm round low-attenuation lesion of the lateral upper pole of the right kidney, consistent with benign renal cortical cysts.      Diverticulosis of the distal colon.      No other acute findings in the abdomen or pelvis.      US RENAL COMPLETE   Final Result   IMPRESSION :      Indeterminate hypoechoic lesion in the right upper kidney requires further evaluation as detailed above.      No hydronephrosis.         XR LUMBAR SPINE (2-3 VIEWS)   Final Result   Impression: No acute osseous abnormality.      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      XR CHEST PORTABLE   Final Result      Clear lungs.      Normal cardiac mediastinal silhouette      Elevated right hemidiaphragm.      Right shoulder arthroplasty noted.          ASSESSMENT AND  PLAN:  Gabrielle Wells is a 69 y.o. female who presents with dizziness and was admitted for symptomatic bradycardia and AKI.    # Dizziness 2/2 symptomatic bradycardia vs dehydration  Poor po intake; EKG revealed sinus bradycardia w/ T-wave inversions. No recent cardiology follow ups; must r/o medication side effects   - stop IVF  - holding home metoprolol  - orthostatics negative  -OT 21/24, PT 21/24  - cardiology consult; appreciate recs  - OK for discharge per cards, follow up Dr. Keenan Bachelor in 1 week with BMP prior    # Headache  -Norco was not helping very much, and patient reported a  rash and itchiness with Dilaudid  -Likely secondary to elevated blood pressure have increased BP meds  -We will give Compazine and Benadryl    # Sinus bradycardia -resolved  Likely 2/2 severe AKI in setting of BB @ clonidine per cards  -Holding metoprolol but restarted on low-dose Coreg    #Abdominal pain -improving  -CT abdomen pelvis revealed 3.3 x 3.0 cm round low-attenuation lesion of the lateral upper pole of the right kidney, consistent with benign renal cortical cysts. Diverticulosis of the distal colon.   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  BUN:Cr less than 20.  Likely secondary to retention   - Bladder scan pt retaining after voiding, inserted foley, Cr improving 1.2 -> 1.1  -Voiding trial yesterday, UO 1.4 L in the past 24 hours  -Stop IV fluids  - urine analysis dirty with 21-50 epithelial cells. There is trace leukocyte esterase and trace protein.  - holding home diuretics  - renal ultrasound revealed indeterminate hypoechoic lesion in the right upper kidney requires further evaluation as detailed above.   -Avoid nephrotoxic agents and contrast if possible  -Consider nephrology consult    Uncomplicated UTI  -Remove Foley  - Voiding trial today  -UA dirty with 6-10 epithelial cells, but positive nitrite, small leukocyte esterase, 51-100  WBC, 2+ bacteria and > 100 RBC  - urine cx pending   -Continue ceftriaxone, day #2.  She will be discharged and given cefdinir to complete a total of 7 days treatment.  -Started Pyridium for dysuria    # Thrombocytopenia -   - PLTs trending up 114 -> 123    # Chronic lumbar back pain  History of lumbar pars fracture, lumbar radiculopathy and epidural abscess. No follow up lumbar XR s/p I&D.  - XR lumbar spine revealed moderate to severe facet arthropathy in the lower lumbar spine.  Grade 1 retrolisthesis L2 on L3.  - continue home gabapentin    Hypertension - holding valsartan and furosemide secondary to AKI.  Holding metoprolol secondary to bradycardia.  -Not well controlled  -Cont Coreg 3.125 mg twice daily  -Increased Norvasc to 10 mg daily  - Restart home valsartan  -As needed hydralazine 10 mg IV every 4 hours for systolic blood pressure greater than 180  GERD - continue protonix  Morbid Obesity: Body mass index is 52.49 kg/m??.. Complicating assessment and treatment. Placing patient at risk for multiple co-morbidities as well as early death and contributing to the patient's presentation. Counseled on diet, weight loss    Code Status: Full Code  FEN: DIET CARDIAC;  PPX: Heparin  DISPO: Home    This patient will be discussed with attending, Dr Vonzella Nipple.    Noreene Filbert DO, PGY- 1  10/15/2019,  11:21 AM

## 2019-10-15 NOTE — Discharge Instructions (Signed)
Continuity of Care Form    Patient Name: Gabrielle Wells   DOB:  August 27, 1950  MRN:  [id number]    Admit date:  10/11/2019  Discharge date:  10/15/19    Code Status Order: Full Code   Advance Directives:   Advance Care Flowsheet Documentation     Date/Time Healthcare Directive Type of Healthcare Directive Copy in [place] Agent's Name Healthcare Agent's Phone Number    10/15/19 1427  No, patient does not have an advance directive for healthcare treatment -- -- -- -- --          Admitting Physician:  Krystal Clark, MD  PCP: Minna Antis, MD    Discharging Nurse: Tyron Russell Unit/Room#: 5304/5304-01  Discharging Unit Phone Number: [phone]    Emergency Contact:   Extended Emergency Contact Information  Primary Emergency Contact: Ssmith,Evard  Home Phone: [phone]  Relation: Brother/Sister  Interpreter needed? No    Past Surgical History:  Past Surgical History:   Procedure Laterality Date   ??? APPENDECTOMY     ??? BREAST LUMPECTOMY Bilateral     benign lesions   ??? CARDIAC CATHETERIZATION  2012   ??? CHOLECYSTECTOMY     ??? HYSTERECTOMY, TOTAL ABDOMINAL     ??? TOTAL KNEE ARTHROPLASTY Right        Immunization History:   Immunization History   Administered Date(s) Administered   ??? Influenza, Quadv, IM, PF (6 mo and older Fluzone, Flulaval, Fluarix, and 3 yrs and older Afluria) 10/15/2019   ??? Pneumococcal Polysaccharide (Pneumovax23) 09/08/2019   ??? Tdap (Boostrix, Adacel) 09/08/2019       Active Problems:  Patient Active Problem List   Diagnosis Code   ??? Lumbar radiculopathy M54.16   ??? Heart failure (HCC) I50.9   ??? Essential hypertension I10   ??? GERD (gastroesophageal reflux disease) K21.9   ??? Wound of back S21.209A   ??? Acute kidney injury ([place]) N17.9       Isolation/Infection:   Isolation          No Isolation        Patient Infection Status     None to display          Nurse Assessment:  Last Vital Signs: BP 122/79    Pulse 83    Temp 98.4 ??F (36.9 ??C) (Oral)    Resp 16    Ht  5\' 3"  (1.6 m)    Wt 296 lb 4.8 oz (134.4 kg)    SpO2 98%    BMI 52.49 kg/m??     Last documented pain score (0-10 scale): Pain Level: 5  Last Weight:   Wt Readings from Last 1 Encounters:   10/14/19 296 lb 4.8 oz (134.4 kg)     Mental Status:  oriented, alert, coherent, logical, thought processes intact and able to concentrate and follow conversation    IV Access:  - None    Nursing Mobility/ADLs:  Walking   Assisted  Transfer  Assisted  Bathing  Assisted  Dressing  Assisted  Toileting  Assisted  Feeding  Assisted  Med [place] Delivery   whole    Wound Care Documentation and Therapy:        Elimination:  Continence:   ?? Bowel: Yes  ?? Bladder: Yes  Urinary Catheter: None   Colostomy/Ileostomy/Ileal Conduit: No       Date of Last BM: 10/15/19    Intake/Output Summary (Last 24 hours) at 10/15/2019 1530  Last data filed at 10/15/2019 1427  Gross per 24 hour   Intake 1090 ml   Output 3050 ml   Net -1960 ml     I/O last 3 completed shifts:  In: [id number] [P.O.:1080; I.V.:10]  Out: 3050 [Urine:3050]    Safety Concerns:     At Risk for Falls    Impairments/Disabilities:      None    Nutrition Therapy:  Current Nutrition Therapy:   - Oral Diet:  Cardiac    Routes of Feeding: Oral  Liquids: No Restrictions  Daily Fluid Restriction: no  Last Modified Barium Swallow with Video (Video Swallowing Test): not done    Treatments at the Time of Hospital Discharge:   Respiratory Treatments: n/a  Oxygen Therapy:  is not on home oxygen therapy.  Ventilator:    - No ventilator support    Rehab Therapies: Physical Therapy and Occupational Therapy  Weight Bearing Status/Restrictions: No weight bearing restirctions  Other Medical Equipment (for information only, NOT a DME order):  walker  Other Treatments: n/a    Patient's personal belongings (please select all that are sent with patient):  None    RN SIGNATURE:  Electronically signed by Robyne Peers, RN on 10/15/19 at 3:31 PM EST    CASE MANAGEMENT/SOCIAL WORK SECTION    Inpatient  Status Date: ***    Readmission Risk Assessment Score:  Readmission Risk              Risk of Unplanned Readmission:        12           Discharging to Facility/ Agency   ?? Name:   ?? Address:  ?? Phone:  ?? Fax:    Dialysis Facility (if applicable)   ?? Name:  ?? Address:  ?? Dialysis Schedule:  ?? Phone:  ?? Fax:    Case Manager/Social Worker signature: {Esignature:[id number]}    PHYSICIAN SECTION    Prognosis: {Prognosis:[phone]}    Condition at Discharge: [place] Patient Condition:[id number]}    Rehab Potential (if transferring to Rehab): {Prognosis:[phone]}    Recommended Labs or Other Treatments After Discharge: ***    Physician Certification: I certify the above information and transfer of Gabrielle Wells  is necessary for the continuing treatment of the diagnosis listed and that she requires {Admit to Appropriate Level of Care:20763} for {GREATER/LESS:304500278} 30 days.     Update Admission H&P: {CHP DME Changes in [place]:[id number]    PHYSICIAN SIGNATURE:  {Esignature:[id number]}

## 2019-10-15 NOTE — Discharge Summary (Signed)
Hospital Medicine Discharge Summary    Patient ID: Gabrielle Wells   Gender: female  DOB: Dec 26, 1950   Age: 69 y.o.  MRN: [id number]  Code Status: Full Code    Patient's PCP: Minna Antis, MD    Admit Date: 10/11/2019     Discharge Date:   10/15/19    Admitting Physician: Krystal Clark, MD     Discharge Physician: Noreene Filbert, DO     Discharge Diagnoses:    Dizziness secondary to symptomatic bradycardia  Sinus bradycardia  AKI on CKD 4  Thrombocytopenia  Chronic lumbar back pain  Hypertension  Uncomplicated UTI  Tension headache    The patient was seen and examined on day of discharge and this discharge summary is in conjunction with any daily progress note from day of discharge.    Hospital Course:     69 y.o. female with PMH of CKD4, HFpEF, HTN, morbid obesity, and GERD who p/w dizziness.  She started to feel fatigued and unwell yesterday but was able to ambulate and move around with shortness of breath on exertion. She presented to her PCP clinic with generalized weakness, fatigue, and was diaphoretic. She states that when she stands up, she gets dizzy and feels as though she is about to pass out.  She has not had a syncopal episode or endorses any vertigo.  She also endorses chronic back pain that seems to have been worsening since yesterday.  She has a history of lumbar pars fracture and back surgery. She denied any shortness of breath, abdominal pain, extremity swelling, chest pain, fever, cough, chills or changes in bowel/urinary movements.   ??  ED course: Patient presented diaphoretic and pale.  She was hypotensive in the 80s SBP and bradycardic with HR 55.  Lab workup was significant for elevated Cr 3.3, elevated pro-BNP 202, and negative troponin levels.  EKG revealed T-wave inversions in leads II, III and aVF with no ST segment elevations.  She was given 500 cc LR and shown much improvement of her symptoms.  CXR was negative for findings.  She was admitted for further work-up of her dizziness and  bradycardia.    Patient's home metoprolol was held.  Orthostatics were performed and were negative.  Cardiology was consulted.  Patient reported poor oral intake and so she was hydrated with IV fluids.  The bradycardia may have been secondary to severe AKI in the setting of use of beta-blockers and clonidine, both of which were held.  AKI was found to be secondary to urinary retention.  A Foley was placed and the creatinine improved.  The Foley was then removed and a voiding trial attempted.  On that day patient started reporting burning with urination and frequency and urgency, so urinalysis was obtained which was dirty but suggestive of a urinary tract infection.  She was started on ceftriaxone which she received for 2 days.  She will be discharged home on cefdinir 300 mg to take twice daily for 5 more days.  Patient developed hypertension while off her home medications, so she was started on Norvasc.  Once her AKI resolved she was restarted back on her home medications.  She will be discharged with the following instructions:    Please follow up with Dr. Keenan Bachelor (cardiologist) in 1 week. Please have blood work drawn 2 days before then (BMP).  Please follow up with your PCP in 1 week.    Please note changes in medications:  1. Please take cefdinir 300 mg oral twice daily  for 5 days.  2. Please take coreg 6.25 mg oral twice daily.  3. Please take lasix 40 mg oral daily (not home 80 mg)  4. Please STOP taking metoprolol.    Disposition:  Home    Physical Exam Performed:     BP (!) 144/83 Comment: rn notified   Pulse 76    Temp 98.8 ??F (37.1 ??C) (Oral)    Resp 16    Ht 5\' 3"  (1.6 m)    Wt 296 lb 4.8 oz (134.4 kg)    SpO2 96%    BMI 52.49 kg/m??     Constitutional:       Appearance: Normal appearance. She is morbidly obese.   HENT:      Head: Normocephalic and atraumatic.      Mouth/Throat:      Mouth: Mucous membranes are moist.      Pharynx: Oropharynx is clear.   Eyes:      Conjunctiva/sclera: Conjunctivae normal.       Pupils: Pupils are equal, round, and reactive to light.   Neck:      Musculoskeletal: Normal range of motion and neck supple.   Cardiovascular:      Rate and Rhythm: Normal rate and regular rhythm.      Pulses: Normal pulses.      Heart sounds: Normal heart sounds. No murmur. No gallop.    Pulmonary:      Effort: Pulmonary effort is normal. No respiratory distress.      Breath sounds: Normal breath sounds. No wheezing or rales.   Abdominal:      General: Bowel sounds are normal. There is no distension.      Palpations: Abdomen is soft. There is no mass.      Tenderness: There is abdominal tenderness.      Comments: Patient very sensitive to deep palpation of the abdomen she has rashes from hernias.  Patient very sensitive to deep palpation in right lower quadrant and has suprapubic tenderness.   Musculoskeletal:         General: Tenderness (lumbar spine) present.      Right lower leg: Edema (1+) present.      Left lower leg: Edema (1+) present.   Skin:     General: Skin is warm and dry.      Capillary Refill: Capillary refill takes less than 2 seconds.   Neurological:      General: No focal deficit present.      Mental Status: She is alert and oriented to person, place, and time. Mental status is at baseline.       Labs: For convenience and continuity at follow-up the following most recent labs are provided:      CBC:    Lab Results   Component Value Date    WBC 4.9 10/15/2019    HGB 12.6 10/15/2019    HCT 39.7 10/15/2019    PLT 123 10/15/2019       Renal:    Lab Results   Component Value Date    NA 140 10/15/2019    K 4.3 10/15/2019    CL 104 10/15/2019    CO2 27 10/15/2019    BUN 10 10/15/2019    CREATININE 1.1 10/15/2019    CALCIUM 9.4 10/15/2019    PHOS 3.0 10/12/2019         Significant Diagnostic Studies    Radiology:   CT ABDOMEN PELVIS WO CONTRAST   Final Result      3.3 x  3.0 cm round low-attenuation lesion of the lateral upper pole of the right kidney, consistent with benign renal cortical cysts.       Diverticulosis of the distal colon.      No other acute findings in the abdomen or pelvis.      US RENAL COMPLETE   Final Result   IMPRESSION :      Indeterminate hypoechoic lesion in the right upper kidney requires further evaluation as detailed above.      No hydronephrosis.         XR LUMBAR SPINE (2-3 VIEWS)   Final Result   Impression: No acute osseous abnormality.      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      XR CHEST PORTABLE   Final Result      Clear lungs.      Normal cardiac mediastinal silhouette      Elevated right hemidiaphragm.      Right shoulder arthroplasty noted.             Consults:     IP CONSULT TO HOSPITALIST  IP CONSULT TO CARDIOLOGY  IP CONSULT TO PHARMACY  IP CONSULT TO SOCIAL WORK    Disposition:  Home with home health    Condition at Discharge: Stable    Discharge Instructions/Follow-up:      Please follow up with Dr. Keenan Bachelor (cardiologist) in 1 week. Please have blood work drawn 2 days before then (BMP).  Please follow up with your PCP in 1 week.    Please note changes in medications:  1. Please take cefdinir 300 mg oral twice daily for 5 days.  2. Please take coreg 6.25 mg oral twice daily.  3. Please take lasix 40 mg oral daily (not home 80 mg)  4. Please STOP taking metoprolol.    Code Status:  Full Code     Activity: activity as tolerated    Diet: renal diet    Discharge Medications:     Current Discharge Medication List           Details   carvedilol (COREG) 6.25 MG tablet Take 1 tablet by mouth 2 times daily  Qty: 60 tablet, Refills: 3      phenazopyridine (PYRIDIUM) 200 MG tablet Take 1 tablet by mouth 3 times daily (with meals) for 3 days  Qty: 9 tablet, Refills: 0      cefdinir (OMNICEF) 300 MG capsule Take 1 capsule by mouth 2 times daily for 5 days  Qty: 10 capsule, Refills: 0              Details   furosemide (LASIX) 80 MG tablet Take 0.5 tablets by mouth daily  Qty: 60 tablet, Refills: 3              Details   loperamide (IMODIUM) 2 MG capsule  Take 2 mg by mouth 4 times daily as needed for Diarrhea      omeprazole (PRILOSEC) 20 MG delayed release capsule Take 1 capsule by mouth every morning (before breakfast)  Qty: 90 capsule, Refills: 2    Associated Diagnoses: Gastroesophageal reflux disease, unspecified whether esophagitis present      valsartan (DIOVAN) 80 MG tablet Take 1 tablet by mouth daily  Qty: 90 tablet, Refills: 1      cloNIDine (CATAPRES) 0.2 MG tablet Take 0.2 mg by mouth 2 times daily      gabapentin (NEURONTIN) 300 MG capsule Take 300 mg by mouth 2 times  daily.      vitamin D3 (CHOLECALCIFEROL) 25 MCG (1000 UT) TABS tablet Take 2,000 Units by mouth daily       citalopram (CELEXA) 40 MG tablet Take 40 mg by mouth daily      potassium chloride (KLOR-CON M) 20 MEQ TBCR extended release tablet Take 40 mEq by mouth 2 times daily             Time Spent on discharge is more than 30 minutes in the examination, evaluation, counseling and review of medications and discharge plan.      Signed:    Noreene Filbert, DO   10/15/2019

## 2019-10-15 NOTE — Care Coordination-Inpatient (Signed)
Case Management Assessment            Discharge Note                    Date / Time of Note: 10/15/2019 1:54 PM                  Discharge Note Completed by: Vincent Peyer    Patient Name: Gabrielle Wells   Date of Birth: [birth year]  Diagnosis: Acute kidney injury [facility]) [N17.9]   Date / Time: 10/11/2019  2:02 PM    Current PCP: Minna Antis, MD  Clinic patient: No    Hospitalization in the last 30 days: No    Advance Directives:  Code Status: Full Code  [place] DNR form completed and on chart: No    Financial:  Payor: MEDICARE / Plan: MEDICARE PART B / Product Type: *No Product type* /      Pharmacy:    [facility] [place], [address] [place]  [place] [address]  Phone: [phone] Fax: [phone]      Assistance purchasing medications?: Potential Assistance Purchasing Medications: No  Assistance provided by Case Management: None at this time    Does patient want to participate in local refill/ meds to beds program?: No    Meds To Beds General Rules:  1. Can ONLY be done Monday- Friday between 8:30am-5pm  2. Prescription(s) must be in pharmacy by 3pm to be filled same day  3.Copy of patient's insurance/ prescription drug card and patient face sheet must be sent along with the prescription(s)  4. Cost of Rx cannot be added to hospital bill. If financial assistance is needed, please contact unit case manager or Education officer, museum; Case manager or Social Worker CANNOT provide pharmacy voucher for patients co-pays  5. Patients can then pick up the prescription on their way out of the hospital at discharge, or pharmacy can deliver to the bedside if staff is available. (payment due at time of pick-up or delivery - cash, check, or card accepted)     Able to afford home medications/ co-pay costs: Yes    ADLS:  Current PT AM-PAC Score: 21 /24  Current OT AM-PAC Score: 19 /24      DISCHARGE Disposition: Home with [place]: [place] skilled care     LOC at discharge: Not Applicable  COC Completed: Not Indicated    Notification completed in HENS/PAS?:  Not Applicable    IMM Completed:       Transportation:  Market researcher for discharge: family   Mode of Transport: [place]:  [place] ordered at discharge: Yes  [place]: [facility]  Phone: [phone]  Fax: [phone]  Orders faxed: Yes, susan aware of DC today will follow    Durable Medical Equipment:  DME Provider: none  Equipment obtained during hospitalization:     Home Oxygen and Respiratory Equipment:  Oxygen needed at discharge?: Not [place]: Not Applicable  Portable tank available for discharge?: Not Indicated    Dialysis:  Dialysis patient: No    Dialysis Center:  Not Applicable    Additional CM Notes: Pt from home with brother will return home with [place] skilled care family to drive home.  Manuela Schwartz aware of DC today will follow pt after Dc  The Plan for Transition of Care is related to the following treatment goals of Acute kidney injury [facility]) [N17.9]    The Patient and/or patient representative Gabrielle Wells and her family were provided with a choice of provider and agrees with the discharge plan Yes    Freedom of choice list was provided with basic dialogue that supports the patient's individualized plan of care/goals and shares the quality data associated with the providers. Yes    Care Transitions patient: No    Vincent Peyer, RN  [facility]  Case Management Department  Ph: [phone]  Fax: [phone]

## 2019-10-15 NOTE — Progress Notes (Signed)
Physical Therapy  Facility/Department: [facility] S[facility]ERY  Daily Treatment Note  NAME: Gabrielle Wells  DOB: [birth year]  MRN: [id number]    Date of Service: 10/15/2019    Discharge Recommendations: Gabrielle Wells scored a 21/24 on the AM-PAC short mobility form. Current research shows that an AM-PAC score of 18 or greater is typically associated with a discharge to the patient's home setting. Based on the patient's AM-PAC score and their current functional mobility deficits, it is recommended that the patient have 2-3 sessions per week of Physical Therapy at d/c to increase the patient's independence.?? At this time, this patient demonstrates the endurance and safety to discharge home with ??(home  services) and a follow up treatment frequency of 2-3x/wk.  Please see assessment section for further patient specific details.      PT Equipment Recommendations  Equipment Needed: No    Assessment   Assessment: Pt was limited by lethargy but progressing well. Bed mobility was effortful but no assistance required. Transfers and gait training were mildly unsteady requiring sequencing cues throughout; no LOB noted.     Treatment Diagnosis: mobility impairment due to hypotension  Decision Making: Medium Complexity  Patient Education: Pt educated on PT role, importance of OOB mobility, need to call for assist to get  up and she verbalized understanding.  REQUIRES PT FOLLOW UP: Yes     Patient Diagnosis(es): The encounter diagnosis was AKI (acute kidney injury) ([place]).     has a past medical history of CHF (congestive heart failure) ([place]), Epidural abscess, GERD (gastroesophageal reflux disease), HTN (hypertension), IBS (irritable bowel syndrome), Lumbar radiculopathy, Morbid obesity ([place]), Muscle fasciculation, Neuropathic pain, SBO (small bowel obstruction) ([place]), Spinal fracture of T8 vertebra ([place]), Surgical site infection, and Urinary incontinence.   has a past surgical history that includes Appendectomy; Breast lumpectomy  (Bilateral); Hysterectomy, total abdominal; Cholecystectomy; Total knee arthroplasty (Right); and Cardiac catheterization (2012).    Restrictions  Position Activity Restriction  Other position/activity restrictions: up with assist  Subjective   General  Chart Reviewed: Yes  Additional Pertinent Hx: 69 y.o. female with PMH of CKD, HFpEF, HTN, morbid obesity, and GERD who presented to ED 10/11/19 for  dizziness. Work up positive for symptomatic bradycardia/hypotension/AKI. CXR adn LS XRs both neg; cardio consult.  Subjective  Subjective: Pt was in bed willing to participate; struggling with lethargy.  Pain Screening  Patient Currently in Pain: Yes  Pain Assessment  Pain Level: 3  Pain Type: Acute pain  Pain Location: Head  Pain Orientation: Anterior  Vital Signs  Patient Currently in Pain: Yes       Orientation  Orientation  Overall Orientation Status: Within Functional Limits  Objective   Bed mobility  Supine to Sit: Supervision  Scooting: Supervision  Transfers  Sit to Stand: Modified independent  Stand to sit: Modified independent  Ambulation  Ambulation?: Yes  Ambulation 1  Device: Rolling Walker  Assistance: Supervision  Quality of Gait: forward posture with slow cadence; decreased step length/height; DOE  Distance: 81f     Balance  Sitting - Static: Good  Sitting - Dynamic: Fair  Standing - Static: Fair  Standing - Dynamic: Fair;-  Exercises  Gluteal Sets: 15x5  sec B  Knee Long Arc Quad: 15x5 sec B  Ankle Pumps: x20 B     AM-PAC Score  AM-PAC Inpatient Mobility Raw Score : 21 (10/15/19 1145)  AM-PAC Inpatient T-Scale Score : 50.25 (10/15/19 1145)  Mobility Inpatient CMS 0-100% Score: 28.97 (10/15/19 1145)  Mobility  Inpatient CMS G-Code Modifier : CJ (10/15/19 1145)          Goals  Short term goals  Time Frame for Short term goals: discharge  Short term goal 1: ambulate 50 ft with rolling walker supervision  Short term goal 2: independent B LE HEP x10  Patient Goals   Patient goals : return home    Plan     Plan  Times per week: 2-5  Current Treatment Recommendations: Strengthening, Training and development officer, Personnel officer, Functional Mobility Training  Safety Devices  Type of devices: Nurse notified, Call light within reach, Chair alarm in place     Therapy Time   Individual Concurrent Group Co-treatment   Time In 1113         Time Out 1138         Minutes 25         Timed Code Treatment Minutes: [place], PTA     1/2 STGs met; continue per POC.   [place], [place]

## 2019-10-15 NOTE — Progress Notes (Addendum)
Cardiology Consult Service  Daily Progress Note        Admit Date:  10/11/2019  Primary cardiologist: Dr Keenan Bachelor, new    Reason for Consultation/Chief Complaint: bradycardia    Subjective:     Gabrielle Wells is a 69 y.o. female with a past medical history of morbid obesity, HTN, HFpEF, CKD, GERD.   ??  Echo 05/2015: LVEF [id number], diastolic dysfunction 1.  ??  Patient presented to the emergency room on 2/22 with complaints of orthostatic lightheadedness, fatigue, dyspnea, URI symptoms (runny nose, congestion).  Patient has been compliant with her medications.   ??  At the ED she was afebrile, HR 59, BP 88/49 mmHg, normal oxygen saturation on room air.  Creatinine 3.3 (baseline 1.0), troponin negative, proBNP 200, CBC normal, chest x-ray unremarkable/normal, ECG consistent with sinus bradycardia 55 bpm, nonspecific changes.  Patient was admitted for hypotension due to dehydration, sinus bradycardia, AKI.  She received a total of 2 L of IV fluids as boluses.  Cardiology was consulted due to bradycardia.  ??  Home medications Lasix 80 mg daily, Toprol 25 daily, valsartan 80 daily, clonidine 0.2 twice daily.    Interval history:  Patient reports no complaints.  BP elevated.    Objective:     Medications:  ??? valsartan  80 mg Oral Daily   ??? phenazopyridine  200 mg Oral TID WC   ??? carvedilol  6.25 mg Oral BID   ??? [START ON 10/16/2019] furosemide  40 mg Oral Daily   ??? butalbital-acetaminophen-caffeine  1 tablet Oral Once   ??? cefTRIAXone (ROCEPHIN) IV  1,000 mg Intravenous Q24H   ??? amLODIPine  10 mg Oral Daily   ??? citalopram  40 mg Oral Daily   ??? pantoprazole  40 mg Oral QAM AC   ??? gabapentin  300 mg Oral BID   ??? Vitamin D  2,000 Units Oral Daily   ??? sodium chloride flush  10 mL Intravenous 2 times per day   ??? heparin (porcine)  5,000 Units Subcutaneous 3 times per day   ??? influenza virus vaccine  0.5 mL Intramuscular Prior to discharge       IV drips:      PRN:  oxyCODONE, hydrALAZINE, hydrOXYzine, HYDROcodone 5 mg - acetaminophen,  sodium chloride flush, promethazine **OR** ondansetron, polyethylene glycol, acetaminophen **OR** acetaminophen    Vitals:    10/15/19 0808 10/15/19 0919 10/15/19 1044 10/15/19 1104   BP:  (!) 179/102 (!) 157/94 (!) 144/83   Pulse: 89  87 76   Resp:   18 16   Temp:   98.1 ??F (36.7 ??C) 98.8 ??F (37.1 ??C)   TempSrc:   Oral Oral   SpO2:   95% 96%   Weight:       Height:           Intake/Output Summary (Last 24 hours) at 10/15/2019 1237  Last data filed at 10/15/2019 1055  Gross per 24 hour   Intake 850 ml   Output 1900 ml   Net -1050 ml     I/O last 3 completed shifts:  In: 740 [P.O.:720; I.V.:20]  Out: 1400 [Urine:1400]  Wt Readings from Last 3 Encounters:   10/14/19 296 lb 4.8 oz (134.4 kg)   10/11/19 281 lb 4.8 oz (127.6 kg)   09/08/19 280 lb (127 kg)       Admit Wt: Weight: 281 lb 4.9 oz (127.6 kg)   Todays Wt: Weight: 296 lb 4.8 oz (134.4 kg)  TELEMETRY: Sinus     Physical Exam:         General Appearance:  Alert, cooperative, no distress, appears stated age Appropriate weight   Head:  Normocephalic, without obvious abnormality, atraumatic   Eyes:  PERRL, conjunctiva/corneas clear EOM intact  Ears normal   Throat no lesions       Nose: Nares normal, no drainage or sinus tenderness   Throat: Lips, mucosa, and tongue normal   Neck: Supple, symmetrical, trachea midline, no adenopathy, thyroid: not enlarged, symmetric, no tenderness/mass/nodules, no carotid bruit.        Lungs:   Normal respiratory rate, lungs clear to auscultation without any wheezes, rubs or ronchi bilaterally.    Chest Wall:  No tenderness or deformity   Heart:  Regular rhythm, rate is controlled, S1, S2 normal, there is no murmur, there is no rub or gallop, no jvd, no bilateral lower extremity edema   Abdomen:   Soft, non-tender, bowel sounds active all four quadrants,  no masses, no organomegaly       Extremities: Extremities normal, atraumatic, no cyanosis.    Pulses: 2+ and symmetric   Skin: Skin color, texture, turgor normal, no rashes or  lesions   Pysch: Normal mood and affect   Neurologic: Normal gross motor and sensory exam.  Cranial nerves intact       Labs:   Recent Labs     10/13/19  0524 10/14/19  0523 10/15/19  0542   NA 137 140 140   K 4.4 4.5 4.3   BUN 24* 14 10   CREATININE 1.6* 1.2 1.1   CL 105 108 104   CO2 20* 26 27   GLUCOSE 83 95 91   CALCIUM 8.6 8.6 9.4     Recent Labs     10/13/19  0524 10/14/19  0523 10/15/19  0542   WBC 4.2 3.9* 4.9   HGB 12.2 11.9* 12.6   HCT 40.0 37.4 39.7   PLT 117* 114* 123*   MCV 89.4 87.1 86.3     No results for input(s): CHOLTOT, TRIG, HDL in the last 72 hours.    Invalid input(s): LIPIDCOMM, CHOLHDL, VLDCHOL, LDL  No results for input(s): PTT, INR in the last 72 hours.    Invalid input(s): PT  No results for input(s): CKTOTAL, CKMB, CKMBINDEX, TROPONINI in the last 72 hours.  No results for input(s): BNP in the last 72 hours.  No results for input(s): NTPROBNP in the last 72 hours.  No results for input(s): TSH in the last 72 hours.    Imaging:       Assessment & Plan:     1.  Hypotension:  It is most like due to dehydration, cannot exclude concomitant infection (URI, rule out Covid).  It has now resolved.  ??  -Resolved  ??  2.  Sinus bradycardia:  It is most likely due to severe AKI in the setting of beta-blockers and clonidine.     - It has now resolved    3. Chronic HFpEF:   Patient was hypovolemic and hypotensive.  BP is now mildly elevated.  Kidney function improved, creatinine down to 1.1    -Decrease Lasix to 40 mg p.o. daily (home dose was 80 mg daily).    4.  Essential hypertension:  BP remains elevated.     -Increase carvedilol to 6.25 twice daily  ??  Patient may be discharged home today on the above meds and follow up with me in 1 week  with a BMP prior to visit. Please call me with any questions.         I have spent 35 minutes of face to face time with the patient with more than 50% spent counseling and coordinating care.     I have personally reviewed the reports and images of labs,  radiological studies, cardiac studies including ECG's and telemetry, current and old medical records. The note was completed using EMR and Dragon dictation system. Every effort was made to ensure accuracy; however, inadvertent computerized transcription errors may be present.    All questions and concerns were addressed to the patient/family. Alternatives to my treatment were discussed.     Thank you for allowing to [name] to participate in the care or Gabrielle Wells. Please call our service with questions.    Hervey Ard, MD, [facility] [place]  [facility]  [address]  [place] [address]  Ph: [phone]  Fax: [phone]

## 2019-10-15 NOTE — Unmapped (Signed)
brYour patient was seen at a [facility] Please go to [email or web address] to view information filed to your patient's chart in Epic.    If you need to view your patient's results prior to gaining access to Epic CareLink, please contact the [facility] where your patient was seen.              Discharge Summary Notes      Discharge Summary by Luanna Cole, DO at 10/15/2019 1:08 PM  Version 1 of 1    Author: Luanna Cole, DO Service: Internal Medicine Author Type: Resident    Filed: 10/15/2019 1:22 PM Date of Service: 10/15/2019 1:08 PM Status: Attested    Editor: Luanna Cole, DO (Resident) Cosigner: Marcie Bal, MD at 10/15/2019 4:07 PM    Attestation signed by Marcie Bal, MD at 10/15/2019 4:07 PM     I have seen and examined this pt. I have reviewed the findings and discussed the care w/ the resident and I agree w/ the plan.    Emilio Aspen, M.D.                    Hospital Medicine Discharge Summary    Patient ID:[AB.1] Veronica Winters[AB.2]   Gender:[AB.1] female[AB.2]  DOB:[AB.1] 02-04-1951[AB.2]          Age:[AB.1] 69 y.o.[AB.2]  MRN:[AB.1] [id number]]  Code Status:[AB.1] Full Code[AB.2]          Patient's PCP:[AB.1] Hina Virk, MD[AB.2]    Admit Date:[AB.1] 10/11/2019[AB.2]     Discharge Date:[AB.1] 10/15/19[AB.3]    Admitting Physician:[AB.1] Adalberto Ill, MD[AB.2]     Discharge Physician:[AB.1] Luanna Cole, DO[AB.2]     Discharge Diagnoses:    Dizziness secondary to symptomatic bradycardia  Sinus bradycardia  AKI on CKD 4  Thrombocytopenia  Chronic lumbar back pain  Hypertension[AB.1]  Uncomplicated UTI  Tension headache[AB.3]    The patient was seen and examined on day of discharge and this discharge summary is in conjunction with any daily progress note from day of discharge.    Hospital Course:     69 y.o.femalewith PMH of CKD4, HFpEF, HTN, morbid obesity, and GERD who p/w dizziness. She started to feel fatigued and unwell yesterday but was  able to ambulate and move around with shortness of breath on exertion. She presented to her PCP clinic with   generalized weakness, fatigue, and was diaphoretic. She states that when she stands up, she gets dizzy and feels as though she is about to pass out. She has not had a syncopal episode or endorses any vertigo. She also endorses chronic back pain that   seems to have been worsening since yesterday. She has a history of lumbar pars fracture and back surgery. She denied any shortness of breath, abdominal pain, extremity swelling, chest pain, fever, cough, chills or changes in bowel/urinary movements.   ?  ED course: Patient presented diaphoretic and pale. She was hypotensive in the 80s SBP and bradycardic with HR 55. Lab workup was significant for elevated Cr 3.3, elevated pro-BNP 202, and negative troponin levels. EKG revealed T-wave inversions in leads   II, III and aVF with no ST segment elevations. She was given 500 cc LR and shown much improvement of her symptoms. CXR was negative for findings.  She was admitted for further work-up of her dizziness and bradycardia.    Patient's home metoprolol was held. Orthostatics were performed and were negative. Cardiology was consulted. Patient reported poor oral intake and so she was  hydrated with IV fluids. The bradycardia may have been secondary to severe AKI in the setting of   use of beta-blockers and clonidine[AB.1], both of which were held[AB.3]. AKI was found to be secondary to urinary retention. A Foley was placed and the creatinine improved. The Foley was then removed and a voiding trial attempted. On that day patient   started reporting burning with urination and frequency and urgency, so urinalysis was obtained which was dirty but suggestive of a urinary tract infection. She was started on[AB.1] ceftriaxone which she received for 2 days. She will be discharged home on   cefdinir 300 mg to take twice daily for 5 more days. Patient developed hypertension  while off her home medications, so she was started on Norvasc. Once her AKI resolved she was restarted back on her home medications.  She will be discharged with the   following instructions:    Please follow up with Dr. Harrison Mons (cardiologist) in 1 week. Please have blood work drawn 2 days before then (BMP).  Please follow up with your PCP in 1 week.    Please note changes in medications:  1. Please take cefdinir 300 mg oral twice daily for 5 days.  2. Please take coreg 6.25 mg oral twice daily.  3. Please take lasix 40 mg oral daily (not home 80 mg)  4. Please STOP taking metoprolol.[AB.3]    Disposition:[AB.1] Home[AB.3]    Physical Exam Performed:[AB.1]     BP (!) 144/83 Comment: rn notified   Pulse 76   Temp 98.8 ?F (37.1 ?C) (Oral)   Resp 16   Ht 5' 3 (1.6 m)   Wt 296 lb 4.8 oz (134.4 kg)   SpO2 96%   BMI 52.49 kg/m?[AB.2]     Constitutional:   Appearance: Normal appearance. She ismorbidly obese.   HENT:   Head: Normocephalicand atraumatic.   Mouth/Throat:   Mouth: Mucous membranes are moist.   Pharynx: Oropharynx is clear.   Eyes:   Conjunctiva/sclera: Conjunctivae normal.   Pupils: Pupils are equal, round, and reactive to light.   Neck:   Musculoskeletal: Normal range of motionand neck supple.   Cardiovascular:   Rate and Rhythm: Normal rateand regular rhythm.   Pulses: Normal pulses.   Heart sounds: Normal heart sounds.No murmur. [place]ogallop.   Pulmonary:   Effort: Pulmonary effort is normal. Norespiratory distress.   Breath sounds: Normal breath sounds. Nowheezingor rales.   Abdominal:   General: Bowel sounds are normal. There is nodistension.   Palpations: Abdomen is soft. There is nomass.   Tenderness: There is abdominal tenderness.   Comments: Patient very sensitive to deep palpation of the abdomen she has rashes from hernias. Patient very sensitive to deep palpation in right lower quadrant and has suprapubic tenderness.  Musculoskeletal:   General: Tenderness(lumbar spine)present.   Right lower leg:  Edema(1+)present.   Left lower leg: Edema(1+)present.   Skin:  General: Skin is warmand dry.   Capillary Refill: Capillary refill takes less than 2 seconds.   Neurological:   General: No focal deficitpresent.   Mental Status: She is alertand oriented to person, place, and time. Mental status isat baseline.[AB.3]      Labs: For convenience and continuity at follow-up the following most recent labs are provided:      CBC:[AB.1]   Lab Results   Component Value Date    WBC 4.9 10/15/2019    HGB 12.6 10/15/2019    HCT 39.7 10/15/2019    PLT 123 10/15/2019[AB.2]  Renal:[AB.1]   Lab Results   Component Value Date    NA 140 10/15/2019    K 4.3 10/15/2019    CL 104 10/15/2019    CO2 27 10/15/2019    BUN 10 10/15/2019    CREATININE 1.1 10/15/2019    CALCIUM 9.4 10/15/2019    PHOS 3.0 10/12/2019[AB.2]         Significant Diagnostic Studies    Radiology:[AB.1]   CT ABDOMEN PELVIS WO CONTRAST   Final Result      3.3 x 3.0 cm round low-attenuation lesion of the lateral upper pole of the right kidney, consistent with benign renal cortical cysts.      Diverticulosis of the distal colon.      No other acute findings in the abdomen or pelvis.      US RENAL COMPLETE   Final Result   IMPRESSION :      Indeterminate hypoechoic lesion in the right upper kidney requires further evaluation as detailed above.      No hydronephrosis.         XR LUMBAR SPINE (2-3 VIEWS)   Final Result   Impression: No acute osseous abnormality.      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      XR CHEST PORTABLE   Final Result      Clear lungs.      Normal cardiac mediastinal silhouette      Elevated right hemidiaphragm.      Right shoulder arthroplasty noted.[AB.2]             Consults:[AB.1]     IP CONSULT TO HOSPITALIST  IP CONSULT TO CARDIOLOGY  IP CONSULT TO PHARMACY  IP CONSULT TO SOCIAL WORK[AB.2]    Disposition:[AB.1] Home with home health[AB.3]    Condition at Discharge:[AB.1] Stable[AB.3]    Discharge  Instructions/Follow-up:[AB.1]     Please follow up with Dr. Harrison Mons (cardiologist) in 1 week. Please have blood work drawn 2 days before then (BMP).  Please follow up with your PCP in 1 week.    Please note changes in medications:  1. Please take cefdinir 300 mg oral twice daily for 5 days.  2. Please take coreg 6.25 mg oral twice daily.  3. Please take lasix 40 mg oral daily (not home 80 mg)  4. Please STOP taking metoprolol.[AB.3]    Code Status:[AB.1] Full Code[AB.2]     Activity: activity as tolerated    Diet:[AB.1] renal diet[AB.3]    Discharge Medications:[AB.1]     Current Discharge Medication List           Details   carvedilol (COREG) 6.25 MG tablet Take 1 tablet by mouth 2 times daily  Qty: 60 tablet, Refills: 3      phenazopyridine (PYRIDIUM) 200 MG tablet Take 1 tablet by mouth 3 times daily (with meals) for 3 days  Qty: 9 tablet, Refills: 0      cefdinir (OMNICEF) 300 MG capsule Take 1 capsule by mouth 2 times daily for 5 days  Qty: 10 capsule, Refills: 0              Details   furosemide (LASIX) 80 MG tablet Take 0.5 tablets by mouth daily  Qty: 60 tablet, Refills: 3              Details   loperamide (IMODIUM) 2 MG capsule Take 2 mg by mouth 4 times daily as needed for Diarrhea      omeprazole (PRILOSEC) 20 MG  delayed release capsule Take 1 capsule by mouth every morning (before breakfast)  Qty: 90 capsule, Refills: 2    Associated Diagnoses: Gastroesophageal reflux disease, unspecified whether esophagitis present      valsartan (DIOVAN) 80 MG tablet Take 1 tablet by mouth daily  Qty: 90 tablet, Refills: 1      cloNIDine (CATAPRES) 0.2 MG tablet Take 0.2 mg by mouth 2 times daily      gabapentin (NEURONTIN) 300 MG capsule Take 300 mg by mouth 2 times daily.      vitamin D3 (CHOLECALCIFEROL) 25 MCG (1000 UT) TABS tablet Take 2,000 Units by mouth daily       citalopram (CELEXA) 40 MG tablet Take 40 mg by mouth daily      potassium chloride (KLOR-CON M) 20 MEQ TBCR extended release tablet Take 40 mEq by  mouth 2 times daily[AB.2]             Time Spent on discharge is more than[AB.1] 30 minutes[AB.3] in the examination, evaluation, counseling and review of medications and discharge plan.      Signed:[AB.1]    Luanna Cole, DO            10/15/2019[AB.2]         Attribution Key     AB.1 - Luanna Cole, DO on 10/14/2019 1:08 PM  AB.2 - Luanna Cole, DO on 10/15/2019 1:22 PM  AB.3 - Luanna Cole, DO on 10/15/2019 1:16 PM                bmk

## 2019-10-15 NOTE — Progress Notes (Signed)
Pt's BP elevated this shift. PRN hydralazine given with benefit, see flowsheets. Pt c/o severe headache. PRNs given per orders. Received order for one time dose of dilaudid. In contact with medical resident on call, came to bedside to evaluate pt.   Purewick placed on pt d/t urgency and incontinence this shift. No skin injury present. All needs met at this time.

## 2019-10-15 NOTE — Progress Notes (Signed)
RN received call from lab with corrected random urea nitrogen results of 42.9. Electronically signed by Fonnie Birkenhead, RN on 10/18/2019 at 2:25 PM

## 2019-10-16 LAB — CULTURE, URINE: Urine Culture, Routine: >100000

## 2019-10-16 LAB — UREA NITROGEN, URINE: Urea Nitrogen, Ur: 42.9 mg/dL — ABNORMAL LOW (ref 800.0–1666.0)

## 2019-10-16 LAB — CREATININE, RANDOM URINE: Creatinine, Ur: 13 mg/dL — ABNORMAL LOW (ref 28.0–259.0)

## 2019-10-20 NOTE — Telephone Encounter (Signed)
SUDIE RN FROM [facility]  STATED PT HAVING LOWER BACK PAIN, SHAKINESS, AND DIARRHEA. CALL SUDIE AT [phone]

## 2019-10-21 ENCOUNTER — Encounter

## 2019-10-21 ENCOUNTER — Emergency Department: Admit: 2019-10-21 | Payer: MEDICARE | Primary: Internal Medicine

## 2019-10-21 ENCOUNTER — Inpatient Hospital Stay
Admit: 2019-10-21 | Discharge: 2019-10-21 | Disposition: A | Discharge status: Home / Self Care | Payer: MEDICARE | Attending: Emergency Medicine

## 2019-10-21 DIAGNOSIS — R197 Diarrhea, unspecified: Secondary | ICD-10-CM

## 2019-10-21 LAB — CBC WITH PLATELET AND DIFFERENTIAL
Basophils Absolute: 0 K/uL (ref 0.0–0.2)
Basophils Relative: 1.1 %
Eosinophils Absolute: 0.1 K/uL (ref 0.0–0.6)
Eosinophils Relative: 1.8 %
Hematocrit: 41.1 % (ref 36.0–48.0)
Hemoglobin: 12.9 g/dL (ref 12.0–16.0)
Lymphocytes Absolute: 1.3 K/uL (ref 1.0–5.1)
Lymphocytes Relative: 38.5 %
MCH: 27.5 pg (ref 26.0–34.0)
MCHC: 31.4 g/dL (ref 31.0–36.0)
MCV: 87.4 fL (ref 80.0–100.0)
MPV: 9.1 fL (ref 5.0–10.5)
Monocytes Absolute: 0.8 K/uL (ref 0.0–1.3)
Monocytes Relative: 24.3 %
Neutrophils Absolute: 1.2 K/uL — ABNORMAL LOW (ref 1.7–7.7)
Neutrophils Relative: 34.3 %
Platelets: 139 K/uL (ref 135–450)
RBC: 4.71 M/uL (ref 4.00–5.20)
RDW: 15.9 % — ABNORMAL HIGH (ref 12.4–15.4)
WBC: 3.4 K/uL — ABNORMAL LOW (ref 4.0–11.0)

## 2019-10-21 LAB — BASIC METABOLIC PANEL REFLEX MG
Anion Gap: 9 (ref 3–16)
BUN: 15 mg/dL (ref 7–20)
CO2: 26 mmol/L (ref 21–32)
Calcium: 8.7 mg/dL (ref 8.3–10.6)
Chloride: 102 mmol/L (ref 99–110)
Creatinine: 1.4 mg/dL — ABNORMAL HIGH (ref 0.6–1.2)
Glucose: 108 mg/dL — ABNORMAL HIGH (ref 70–99)
POC EGFR MDRD Non Af American: 37 — AB (ref >60)
POC GFR MDRD Af Amer: 45 — AB (ref >60)
Potassium: 4.7 mmol/L (ref 3.5–5.1)
Sodium: 137 mmol/L (ref 136–145)

## 2019-10-21 LAB — URINALYSIS-MACROSCOPIC W/REFLEX TO MICROSCOPIC
Bilirubin, UA: NEGATIVE
Blood, UA: NEGATIVE
Glucose, UA: NEGATIVE mg/dL
Ketones, UA: NEGATIVE mg/dL
Nitrite, UA: NEGATIVE
Protein, UA: NEGATIVE mg/dL
Specific Gravity, UA: 1.015 (ref 1.005–1.030)
Urobilinogen, UA: 0.2 EU/dL (ref <2.0)
pH, UA: 6.5 (ref 5.0–8.0)

## 2019-10-21 LAB — URINALYSIS MICROSCOPIC: RBC, UA: NONE SEEN /HPF (ref 0–4)

## 2019-10-21 LAB — CBC WITH AUTO DIFFERENTIAL
Basophils %: 1.1 %
Basophils Absolute: 0 10*3/uL (ref 0.0–0.2)
Eosinophils %: 1.8 %
Eosinophils Absolute: 0.1 10*3/uL (ref 0.0–0.6)
Hematocrit: 41.1 % (ref 36.0–48.0)
Hemoglobin: 12.9 g/dL (ref 12.0–16.0)
Lymphocytes %: 38.5 %
Lymphocytes Absolute: 1.3 10*3/uL (ref 1.0–5.1)
MCH: 27.5 pg (ref 26.0–34.0)
MCHC: 31.4 g/dL (ref 31.0–36.0)
MCV: 87.4 fL (ref 80.0–100.0)
MPV: 9.1 fL (ref 5.0–10.5)
Monocytes %: 24.3 %
Monocytes Absolute: 0.8 10*3/uL (ref 0.0–1.3)
Neutrophils %: 34.3 %
Neutrophils Absolute: 1.2 10*3/uL — ABNORMAL LOW (ref 1.7–7.7)
Platelets: 139 10*3/uL (ref 135–450)
RBC: 4.71 M/uL (ref 4.00–5.20)
RDW: 15.9 % — ABNORMAL HIGH (ref 12.4–15.4)
WBC: 3.4 10*3/uL — ABNORMAL LOW (ref 4.0–11.0)

## 2019-10-21 LAB — URINALYSIS
Bilirubin Urine: NEGATIVE
Blood, Urine: NEGATIVE
Glucose, Ur: NEGATIVE mg/dL
Ketones, Urine: NEGATIVE mg/dL
Nitrite, Urine: NEGATIVE
Protein, UA: NEGATIVE mg/dL
Specific Gravity, UA: 1.015 (ref 1.005–1.030)
Urobilinogen, Urine: 0.2 E.U./dL (ref <2.0)
pH, UA: 6.5 (ref 5.0–8.0)

## 2019-10-21 LAB — BASIC METABOLIC PANEL W/ REFLEX TO MG FOR LOW K
Anion Gap: 9 (ref 3–16)
BUN: 15 mg/dL (ref 7–20)
CO2: 26 mmol/L (ref 21–32)
Calcium: 8.7 mg/dL (ref 8.3–10.6)
Chloride: 102 mmol/L (ref 99–110)
Creatinine: 1.4 mg/dL — ABNORMAL HIGH (ref 0.6–1.2)
GFR African American: 45 — AB (ref >60)
GFR Non-African American: 37 — AB (ref >60)
Glucose: 108 mg/dL — ABNORMAL HIGH (ref 70–99)
Potassium reflex Magnesium: 4.7 mmol/L (ref 3.5–5.1)
Sodium: 137 mmol/L (ref 136–145)

## 2019-10-21 LAB — MICROSCOPIC URINALYSIS: RBC, UA: NONE SEEN /HPF (ref 0–4)

## 2019-10-21 MED ORDER — SODIUM CHLORIDE 0.9 % IV BOLUS
0.9 % | Freq: Once | INTRAVENOUS | Status: DC
Start: 2019-10-21 — End: 2019-10-21

## 2019-10-21 MED ORDER — DIPHENHYDRAMINE HCL 50 MG/ML IJ SOLN
50 MG/ML | Freq: Once | INTRAMUSCULAR | Status: AC
Start: 2019-10-21 — End: 2019-10-21
  Administered 2019-10-21: 19:00:00 25 mg via INTRAVENOUS

## 2019-10-21 MED ORDER — IOHEXOL 240 MG/ML IJ SOLN
240 MG/ML | Freq: Once | INTRAMUSCULAR | Status: AC | PRN
Start: 2019-10-21 — End: 2019-10-21
  Administered 2019-10-21: 21:00:00 50 mL via ORAL

## 2019-10-21 MED ORDER — METHYLPREDNISOLONE SODIUM SUCC 125 MG IJ SOLR
125 MG | Freq: Once | INTRAMUSCULAR | Status: AC
Start: 2019-10-21 — End: 2019-10-21
  Administered 2019-10-21: 19:00:00 125 mg via INTRAVENOUS

## 2019-10-21 MED ORDER — SODIUM CHLORIDE 0.9 % IV BOLUS
0.9 | Freq: Once | INTRAVENOUS | Status: AC
Start: 2019-10-21 — End: 2019-10-21
  Administered 2019-10-21: 18:00:00 1000 mL via INTRAVENOUS

## 2019-10-21 MED FILL — SOLU-MEDROL 125 MG IJ SOLR: 125 mg | INTRAMUSCULAR | Qty: 125

## 2019-10-21 MED FILL — DIPHENHYDRAMINE HCL 50 MG/ML IJ SOLN: 50 mg/mL | INTRAMUSCULAR | Qty: 1

## 2019-10-21 NOTE — ED Notes (Signed)
Pt discharged from ED in stable, ambulatory condition. Discharge instructions explained, all questions answered. Stool kit sent home with pt. Pt completed PO challenge.  Pt walked to lobby independently.       Naoma Diener, RN  10/21/19 1705

## 2019-10-21 NOTE — ED Provider Notes (Signed)
[facility] ENCOUNTER          ATTENDING PHYSICIAN NOTE       Date of evaluation: 10/21/2019    ADDENDUM:      Care of this patient was assumed from Dr. Elvia Collum.  The patient was seen for Diarrhea  .  The patient's initial evaluation and plan have been discussed with the prior provider who initially evaluated the patient.  Nursing Notes, Past Medical Hx, Past Surgical Hx, Social Hx, Allergies, and Family Hx were all reviewed.    Diagnostic Results         RADIOLOGY:  CT ABDOMEN PELVIS WO CONTRAST Additional Contrast? Oral   Final Result   1.  No acute abnormality of the abdomen or pelvis.   2. Colonic diverticulosis. No diverticulitis.   3. Right renal cyst.             LABS:   Results for orders placed or performed during the hospital encounter of 10/21/19   CBC Auto Differential   Result Value Ref Range    WBC 3.4 (L) 4.0 - 11.0 K/uL    RBC 4.71 4.00 - 5.20 M/uL    Hemoglobin 12.9 12.0 - 16.0 g/dL    Hematocrit 41.1 36.0 - 48.0 %    MCV 87.4 80.0 - 100.0 fL    MCH 27.5 26.0 - 34.0 pg    MCHC 31.4 31.0 - 36.0 g/dL    RDW 15.9 (H) 12.4 - 15.4 %    Platelets 139 135 - 450 K/uL    MPV 9.1 5.0 - 10.5 fL    Neutrophils % 34.3 %    Lymphocytes % 38.5 %    Monocytes % 24.3 %    Eosinophils % 1.8 %    Basophils % 1.1 %    Neutrophils Absolute 1.2 (L) 1.7 - 7.7 K/uL    Lymphocytes Absolute 1.3 1.0 - 5.1 K/uL    Monocytes Absolute 0.8 0.0 - 1.3 K/uL    Eosinophils Absolute 0.1 0.0 - 0.6 K/uL    Basophils Absolute 0.0 0.0 - 0.2 K/uL   Basic Metabolic Panel w/ Reflex to MG   Result Value Ref Range    Sodium 137 136 - 145 mmol/L    Potassium reflex Magnesium 4.7 3.5 - 5.1 mmol/L    Chloride 102 99 - 110 mmol/L    CO2 26 21 - 32 mmol/L    Anion Gap 9 3 - 16    Glucose 108 (H) 70 - 99 mg/dL    BUN 15 7 - 20 mg/dL    CREATININE 1.4 (H) 0.6 - 1.2 mg/dL    GFR Non-African American 37 (A) >60    GFR African American 45 (A) >60    Calcium 8.7 8.3 - 10.6 mg/dL   Urinalysis, reflex to microscopic   Result Value  Ref Range    Color, UA Yellow Straw/Yellow    Clarity, UA SL CLOUDY (A) Clear    Glucose, Ur Negative Negative mg/dL    Bilirubin Urine Negative Negative    Ketones, Urine Negative Negative mg/dL    Specific Gravity, UA 1.015 1.005 - 1.030    Blood, Urine Negative Negative    pH, UA 6.5 5.0 - 8.0    Protein, UA Negative Negative mg/dL    Urobilinogen, Urine 0.2 <2.0 E.U./dL    Nitrite, Urine Negative Negative    Leukocyte Esterase, Urine TRACE (A) Negative    Microscopic Examination YES     Urine  Type Voided    Microscopic Urinalysis   Result Value Ref Range    WBC, UA 3-5 0 - 5 /HPF    RBC, UA None seen 0 - 4 /HPF    Epithelial Cells, UA 21-50 (A) 0 - 5 /HPF    Bacteria, UA 2+ (A) None Seen /HPF       RECENT VITALS:  BP: (!) 150/73, Temp: 98.5 ??F (36.9 ??C), Pulse: 65, Resp: 18, SpO2: 100 %     Procedures         ED Course     The patient was given the following medications:  Orders Placed This Encounter   Medications   ??? 0.9 % sodium chloride bolus   ??? DISCONTD: 0.9 % sodium chloride bolus   ??? diphenhydrAMINE (BENADRYL) injection 25 mg   ??? methylPREDNISolone sodium (SOLU-MEDROL) injection 125 mg   ??? iohexol (OMNIPAQUE 240) injection 50 mL       CONSULTS:  None    MEDICAL DECISION MAKING / ASSESSMENT / PLAN     Gabrielle Wells is a 69 y.o. female presenting with complaint of diarrhea, after being treated for pyelonephritis with cefdinir.  She was signed out pending results of laboratory evaluation including stool studies and a CT with oral contrast to evaluate for intestinal obstruction or megacolon..      Patient had unremarkable CT abdomen pelvis, was able to tolerate p.o.  She will be instructed to follow-up as an outpatient for stool studies but there is no sign of toxic megacolon on her CT.  Remainder lab work was unremarkable.    She did not produce a stool sample here that we could send for testing.    Clinical Impression     1. Diarrhea, unspecified type        Disposition     PATIENT REFERRED TO:  Minna Antis, MD  [place]  [place] [id number]  [phone]    Schedule an appointment as soon as possible for a visit       [facility] Emergency Department  [address]  [place] [address]  [phone]    If symptoms worsen      DISCHARGE MEDICATIONS:  Discharge Medication List as of 10/21/2019  4:58 PM          DISPOSITION Decision To Discharge 10/21/2019 04:58:12 PM          Dahlia Bailiff, MD  10/21/19 [phone]

## 2019-10-21 NOTE — ED Triage Notes (Signed)
P-t was discharged from [facility] last Friday after being treated with IV ABX fo-r UTI. She has developed so-me diarrhea and her ho-me health nurse is concerned for Cdiff.

## 2019-10-21 NOTE — ED Provider Notes (Signed)
[place]          ATTENDING PHYSICIAN NOTE       Date of evaluation: 10/21/2019    Chief Complaint     Diarrhea      History of Present Illness     Gabrielle Wells is a 69 y.o. female who presents with a chief complaint of diarrhea.  Patient has a history of chronic kidney disease stage IV, heart failure preserved ejection fraction, hypertension, obesity, GERD who presented with dizziness to the hospital a week and a half ago, was found to be hypotensive and tachycardic with an AKI, and urinalysis suggestive of UTI.  Patient was discharged home with cefdinir.  She presents again today with 2 days of diarrhea.  States that she has been feeling better in terms of urinary symptoms, but that for the last 2 days she has had diarrhea, her first diarrhea bowel movement yesterday was "really gross looking".  Patient admits to some crampy abdominal pain immediately prior to having diarrhea and then improvement in pain after having diarrhea.  Denies fevers, chest pain, shortness of breath.    Review of Systems     Review of Systems   Constitutional: Negative for activity change, appetite change, chills and fever.   HENT: Negative for congestion, ear pain, facial swelling, nosebleeds, rhinorrhea and sore throat.    Eyes: Negative for photophobia, pain, discharge, itching and visual disturbance.   Respiratory: Negative for cough, chest tightness, shortness of breath and wheezing.    Cardiovascular: Negative for chest pain, palpitations and leg swelling.   Gastrointestinal: Positive for diarrhea. Negative for abdominal pain, nausea and vomiting.   Endocrine: Negative for cold intolerance, heat intolerance, polydipsia and polyuria.   Genitourinary: Negative for dysuria, flank pain, hematuria, pelvic pain, vaginal bleeding and vaginal discharge.   Musculoskeletal: Negative for arthralgias, back pain, joint swelling, myalgias and neck pain.   Skin: Negative for rash and wound.    Allergic/Immunologic: Negative for environmental allergies.   Neurological: Negative for dizziness, tremors, seizures, syncope, weakness, light-headedness and headaches.   Hematological: Negative for adenopathy. Does not bruise/bleed easily.   Psychiatric/Behavioral: Negative for confusion and hallucinations.       Past Medical, Surgical, Family, and Social History     She has a past medical history of CHF (congestive heart failure) ([place]), Epidural abscess, GERD (gastroesophageal reflux disease), HTN (hypertension), IBS (irritable bowel syndrome), Lumbar radiculopathy, Morbid obesity ([place]), Muscle fasciculation, Neuropathic pain, SBO (small bowel obstruction) ([place]), Spinal fracture of T8 vertebra ([place]), Surgical site infection, and Urinary incontinence.  She has a past surgical history that includes Appendectomy; Breast lumpectomy (Bilateral); Hysterectomy, total abdominal; Cholecystectomy; Total knee arthroplasty (Right); and Cardiac catheterization (2012).  Her family history is not on file.  She reports that she has never smoked. She has never used smokeless tobacco. She reports that she does not drink alcohol or use drugs.    Medications     Previous Medications    CARVEDILOL (COREG) 6.25 MG TABLET    Take 1 tablet by mouth 2 times daily    CITALOPRAM (CELEXA) 40 MG TABLET    Take 40 mg by mouth daily    CLONIDINE (CATAPRES) 0.2 MG TABLET    Take 0.2 mg by mouth 2 times daily    FUROSEMIDE (LASIX) 80 MG TABLET    Take 0.5 tablets by mouth daily    GABAPENTIN (NEURONTIN) 300 MG CAPSULE    Take 300 mg by mouth 2 times  daily.    LOPERAMIDE (IMODIUM) 2 MG CAPSULE    Take 2 mg by mouth 4 times daily as needed for Diarrhea    OMEPRAZOLE (PRILOSEC) 20 MG DELAYED RELEASE CAPSULE    Take 1 capsule by mouth every morning (before breakfast)    POTASSIUM CHLORIDE (KLOR-CON M) 20 MEQ TBCR EXTENDED RELEASE TABLET    Take 40 mEq by mouth 2 times daily    VALSARTAN (DIOVAN) 80 MG TABLET    Take 1 tablet by mouth daily     VITAMIN D3 (CHOLECALCIFEROL) 25 MCG (1000 UT) TABS TABLET    Take 2,000 Units by mouth daily        Allergies     She is allergic to betadine [povidone iodine]; coconut oil; codeine; fish-derived products; ibuprofen; iodine; other; and sulfa antibiotics.    Physical Exam     INITIAL VITALS: BP: 125/69, Temp: 98.5 ??F (36.9 ??C), Pulse: 72, Resp: 20, SpO2: 100 %   Physical Exam  Constitutional:       General: She is not in acute distress.     Appearance: She is well-developed. She is not diaphoretic.   HENT:      Head: Normocephalic and atraumatic.      Mouth/Throat:      Pharynx: No oropharyngeal exudate.   Eyes:      General:         Right eye: No discharge.         Left eye: No discharge.      Conjunctiva/sclera: Conjunctivae normal.      Pupils: Pupils are equal, round, and reactive to light.   Neck:      Musculoskeletal: Normal range of motion and neck supple.      Thyroid: No thyromegaly.      Vascular: No JVD.      Trachea: No tracheal deviation.   Cardiovascular:      Rate and Rhythm: Normal rate and regular rhythm.      Heart sounds: Normal heart sounds. No murmur. No friction rub. No gallop.    Pulmonary:      Effort: Pulmonary effort is normal. No respiratory distress.      Breath sounds: Normal breath sounds. No stridor. No wheezing or rales.   Chest:      Chest wall: No tenderness.   Abdominal:      General: Bowel sounds are normal. There is no distension.      Palpations: Abdomen is soft.      Tenderness: There is abdominal tenderness (Diffuse tenderness to palpation worse in right upper and right lower quadrant). There is no guarding or rebound.   Musculoskeletal: Normal range of motion.         General: No tenderness or deformity.   Lymphadenopathy:      Cervical: No cervical adenopathy.   Skin:     General: Skin is warm and dry.      Findings: No erythema or rash.   Neurological:      Mental Status: She is alert and oriented to person, place, and time.      Cranial Nerves: No cranial nerve deficit.       Coordination: Coordination normal.   Psychiatric:         Behavior: Behavior normal.         Diagnostic Results     EKG   none    RADIOLOGY:  CT ABDOMEN PELVIS WO CONTRAST Additional Contrast? Oral    (Results Pending)  LABS:   Results for orders placed or performed during the hospital encounter of 10/21/19   CBC Auto Differential   Result Value Ref Range    WBC 3.4 (L) 4.0 - 11.0 K/uL    RBC 4.71 4.00 - 5.20 M/uL    Hemoglobin 12.9 12.0 - 16.0 g/dL    Hematocrit 41.1 36.0 - 48.0 %    MCV 87.4 80.0 - 100.0 fL    MCH 27.5 26.0 - 34.0 pg    MCHC 31.4 31.0 - 36.0 g/dL    RDW 15.9 (H) 12.4 - 15.4 %    Platelets 139 135 - 450 K/uL    MPV 9.1 5.0 - 10.5 fL    Neutrophils % 34.3 %    Lymphocytes % 38.5 %    Monocytes % 24.3 %    Eosinophils % 1.8 %    Basophils % 1.1 %    Neutrophils Absolute 1.2 (L) 1.7 - 7.7 K/uL    Lymphocytes Absolute 1.3 1.0 - 5.1 K/uL    Monocytes Absolute 0.8 0.0 - 1.3 K/uL    Eosinophils Absolute 0.1 0.0 - 0.6 K/uL    Basophils Absolute 0.0 0.0 - 0.2 K/uL   Basic Metabolic Panel w/ Reflex to MG   Result Value Ref Range    Sodium 137 136 - 145 mmol/L    Potassium reflex Magnesium 4.7 3.5 - 5.1 mmol/L    Chloride 102 99 - 110 mmol/L    CO2 26 21 - 32 mmol/L    Anion Gap 9 3 - 16    Glucose 108 (H) 70 - 99 mg/dL    BUN 15 7 - 20 mg/dL    CREATININE 1.4 (H) 0.6 - 1.2 mg/dL    GFR Non-African American 37 (A) >60    GFR African American 45 (A) >60    Calcium 8.7 8.3 - 10.6 mg/dL       ED BEDSIDE ULTRASOUND:  none    RECENT VITALS:  BP: 138/73,Temp: 98.5 ??F (36.9 ??C), Pulse: 65, Resp: 18, SpO2: 100 %     Procedures     none    ED Course     Nursing Notes, Past Medical Hx, Past Surgical Hx, Social Hx,Allergies, and Family Hx were reviewed.    patient was given the following medications:  Orders Placed This Encounter   Medications   ??? 0.9 % sodium chloride bolus   ??? 0.9 % sodium chloride bolus   ??? diphenhydrAMINE (BENADRYL) injection 25 mg   ??? methylPREDNISolone sodium (SOLU-MEDROL) injection 125 mg        CONSULTS:  None    MEDICAL DECISIONMAKING / ASSESSMENT / PLAN     Gabrielle Wells is a 69 y.o. female who presents with a chief complaint of diarrhea.  Initial exam reveals a well-appearing female in no acute distress with normal vitals, afebrile.  Physical exam remarkable for diffuse abdominal tenderness to palpation.    Patient's presentation is concerning for possible C. difficile after recent antibiotics.  Patient states she did take Imodium prior to arrival and is unclear if she will be able to provide a stool sample.    Patient overall appears well but has some mild tenderness to palpation of abdomen.    Labs revealed normal renal panel with the exception of mildly elevated creatinine at 1.4, left the hospital at 1.1 on 2/26.  Has a history of chronic kidney disease.  Normal white count, normal hemoglobin.  Repeat urinalysis pending.    I did  opt to obtain a CT scan, and initially was going to do this with IV contrast prior to realizing the patient had chronic kidney disease -she had already received Benadryl and Solu-Medrol for premedication for IV contrast prior to me realizing that her creatinine was up trending and that in context with her chronic kidney disease we may feel uncomfortable giving a new IV contrast dye.  Therefore I did opt to obtain CT without contrast but with oral contrast to look for cause of diarrhea.    At this time I am going off service and oncoming provider will have to follow-up on CT imaging and urinalysis.  Anticipate patient will be ultimately discharged home, but again this is pending reassessment.      Clinical Impression     diarrhea    Disposition     PATIENT REFERRED TO:  No follow-up provider specified.    DISCHARGE MEDICATIONS:  New Prescriptions    No medications on file       DISPOSITION  - pending re assessment       Ottis Stain, MD  10/21/19 1452

## 2019-10-21 NOTE — ED Notes (Signed)
Bed: B25-25  Expected date:   Expected time:   Means of arrival:   Comments:  HOLD for 3. LF     Blondell Reveal, RN  10/21/19 [phone]

## 2019-10-21 NOTE — Unmapped (Signed)
brYour patient was seen at a [facility]. Please go to [email or web address] to view information filed to your patient's chart in Epic.    If you need to view your patient's results prior to gaining access to Epic CareLink, please contact the [facility] where your patient was seen.         Veronica Winters, Veronica Winters #[id number] ([id number]) (69 y.o. F) PCP: Veronica Winters [phone])        A01-01              ED Arrival Information     Expected Arrival Acuity    - 10/21/2019 12:10 PM 3-Urgent           Means of arrival Escorted by Service Admission type    Walk In Self Emergency Medicine Emergency           Arrival complaint    loose bowel movements, sent by pcp for poss cdiff        bmk  Chief Complaint     Complaint Comment    Diarrhea         ED Vitals    Date/Time Temp Pulse Resp BP SpO2 Weight Who   10/21/19 1500 -- -- -- 150/73 100 % -- LNB   10/21/19 1445 -- -- -- -- 100 % -- LNB   10/21/19 1430 -- -- -- 150/79 100 % -- LNB   10/21/19 1300 -- 65 18 138/73 100 % -- LNB   10/21/19 1222 98.5 ?F (36.9 ?C) 72 20 125/69 100 % 289 lb (131.1 kg) SKL        bmk  Allergies (Review Complete on: 10/21/19)     Agent Severity Comments    Betadine [Povidone Iodine]      Coconut Oil      Codeine      Fish-derived Products      Ibuprofen      Iodine      Other  detergents    Sulfa Antibiotics          bmk  Medical History     Past Medical History           Date  Comments      CHF (congestive heart failure) (HCC) [I50.9]          HTN (hypertension) [I10]          Neuropathic pain [M79.2]    back 2/2 to spinal surgeries      Lumbar radiculopathy [M54.16]          Spinal fracture of T8 vertebra (HCC) [S22.069A]    s/p fusion       Surgical site infection [T81.49XA]    back, spinal hardware       IBS (irritable bowel syndrome) [K58.9]          GERD (gastroesophageal reflux disease) [K21.9]          SBO (small bowel obstruction) (HCC) [K56.609]  07/1999  with partial  colectomy      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  s/i I&D and removaal of hardware      Muscle fasciculation [R25.3]          Urinary incontinence [R32]          Morbid obesity (HCC) [E66.01]                  Surgical History     Past Surgical History         Laterality  Last Occurrence  Comments  Appendectomy [ZOX09]          Breast lumpectomy [SHX2]  Bilateral    benign lesions    Hysterectomy, total abdominal [SHX209]          Cholecystectomy [SHX55]          Total knee arthroplasty [UEA540]  Right        Cardiac catheterization [JWJ191]    2012                Obstetric History    No obstetric history on file.       `  ED Provider Notes     Veronica George, MD 10/21/2019 5:48 PM               THE [facility]  EMERGENCY DEPARTMENT ENCOUNTER          ATTENDING PHYSICIAN NOTE       Date of evaluation: 10/21/2019    ADDENDUM:      Care of this patient was assumed from Dr. Maryjean Winters. The patient was seen for Diarrhea  . The patient's initial evaluation and plan have been discussed with the prior provider who initially evaluated the patient. Nursing Notes, Past Medical Hx, Past Surgical Hx, Social Hx, Allergies, and Family Hx were all reviewed.    Diagnostic Results         RADIOLOGY:  CT ABDOMEN PELVIS WO CONTRAST Additional Contrast? Oral   Final Result   1. No acute abnormality of the abdomen or pelvis.   2. Colonic diverticulosis. No diverticulitis.   3. Right renal cyst.             LABS:   Results for orders placed or performed during the hospital encounter of 10/21/19   CBC Auto Differential   Result Value Ref Range    WBC 3.4 (L) 4.0 - 11.0 K/uL    RBC 4.71 4.00 - 5.20 M/uL    Hemoglobin 12.9 12.0 - 16.0 g/dL    Hematocrit 47.8 29.5 - 48.0 %    MCV 87.4 80.0 - 100.0 fL    MCH 27.5 26.0 - 34.0 pg    MCHC 31.4 31.0 - 36.0 g/dL    RDW 62.1 (H) 30.8 - 15.4 %    Platelets 139 135 - 450 K/uL    MPV 9.1 5.0 - 10.5 fL    Neutrophils % 34.3 %    Lymphocytes % 38.5 %    Monocytes % 24.3 %    Eosinophils  % 1.8 %    Basophils % 1.1 %    Neutrophils Absolute 1.2 (L) 1.7 - 7.7 K/uL    Lymphocytes Absolute 1.3 1.0 - 5.1 K/uL    Monocytes Absolute 0.8 0.0 - 1.3 K/uL    Eosinophils Absolute 0.1 0.0 - 0.6 K/uL    Basophils Absolute 0.0 0.0 - 0.2 K/uL   Basic Metabolic Panel w/ Reflex to MG   Result Value Ref Range    Sodium 137 136 - 145 mmol/L    Potassium reflex Magnesium 4.7 3.5 - 5.1 mmol/L    Chloride 102 99 - 110 mmol/L    CO2 26 21 - 32 mmol/L    Anion Gap 9 3 - 16    Glucose 108 (H) 70 - 99 mg/dL    BUN 15 7 - 20 mg/dL    CREATININE 1.4 (H) 0.6 - 1.2 mg/dL    GFR Non-African American 37 (A) >60    GFR African American 45 (A) >60    Calcium  8.7 8.3 - 10.6 mg/dL   Urinalysis, reflex to microscopic   Result Value Ref Range    Color, UA Yellow Straw/Yellow    Clarity, UA SL CLOUDY (A) Clear    Glucose, Ur Negative Negative mg/dL    Bilirubin Urine Negative Negative    Ketones, Urine Negative Negative mg/dL    Specific Gravity, UA 1.015 1.005 - 1.030    Blood, Urine Negative Negative    pH, UA 6.5 5.0 - 8.0    Protein, UA Negative Negative mg/dL    Urobilinogen, Urine 0.2 <2.0 E.U./dL    Nitrite, Urine Negative Negative    Leukocyte Esterase, Urine TRACE (A) Negative    Microscopic Examination YES     Urine Type Voided    Microscopic Urinalysis   Result Value Ref Range    WBC, UA 3-5 0 - 5 /HPF    RBC, UA None seen 0 - 4 /HPF    Epithelial Cells, UA 21-50 (A) 0 - 5 /HPF    Bacteria, UA 2+ (A) None Seen /HPF       RECENT VITALS: BP: (!) 150/73, Temp: 98.5 ?F (36.9 ?C), Pulse: 65, Resp: 18, SpO2: 100 %     Procedures         ED Course     The patient was given the following medications:  Orders Placed This Encounter   Medications   ? 0.9 % sodium chloride bolus   ? DISCONTD: 0.9 % sodium chloride bolus   ? diphenhydrAMINE (BENADRYL) injection 25 mg   ? methylPREDNISolone sodium (SOLU-MEDROL) injection 125 mg   ? iohexol (OMNIPAQUE 240) injection 50 mL       CONSULTS:  None    MEDICAL DECISION MAKING / ASSESSMENT /  PLAN     [name] is a 69 y.o. female presenting with complaint of diarrhea, after being treated for pyelonephritis with cefdinir. She was signed out pending results of laboratory evaluation including stool studies and a CT with oral contrast to evaluate   for intestinal obstruction or megacolon..      Patient had unremarkable CT abdomen pelvis, was able to tolerate p.o. She will be instructed to follow-up as an outpatient for stool studies but there is no sign of toxic megacolon on her CT. Remainder lab work was unremarkable.    She did not produce a stool sample here that we could send for testing.    Clinical Impression     1. Diarrhea, unspecified type        Disposition     PATIENT REFERRED TO:  Dorice Lamas, MD  [address] [place] [address]  [phone]    Schedule an appointment as soon as possible for a visit       [facility] Emergency Department  [address] [place] [address]  [phone]    If symptoms worsen      DISCHARGE MEDICATIONS:  Discharge Medication List as of 10/21/2019 4:58 PM          DISPOSITION Decision To Discharge 10/21/2019 04:58:12 PM          Veronica George, MD  10/21/19 1748       Toma Copier, MD 10/21/2019 2:52 PM                 THE [facility]  EMERGENCY DEPARTMENT ENCOUNTER          ATTENDING PHYSICIAN NOTE       Date of  evaluation: 10/21/2019    Chief Complaint     Diarrhea      History of Present Illness     Winry A Vassell is a 69 y.o. female who presents with a chief complaint of diarrhea. Patient has a history of chronic kidney disease stage IV, heart failure preserved ejection fraction, hypertension, obesity, GERD who presented with dizziness to the   hospital a week and a half ago, was found to be hypotensive and tachycardic with an AKI, and urinalysis suggestive of UTI. Patient was discharged home with cefdinir. She presents again today with 2 days of diarrhea. States that she has been feeling   better in terms of urinary  symptoms, but that for the last 2 days she has had diarrhea, her first diarrhea bowel movement yesterday was really gross looking. Patient admits to some crampy abdominal pain immediately prior to having diarrhea and then   improvement in pain after having diarrhea. Denies fevers, chest pain, shortness of breath.    Review of Systems     Review of Systems   Constitutional: Negative for activity change, appetite change, chills and fever.   HENT: Negative for congestion, ear pain, facial swelling, nosebleeds, rhinorrhea and sore throat.   Eyes: Negative for photophobia, pain, discharge, itching and visual disturbance.   Respiratory: Negative for cough, chest tightness, shortness of breath and wheezing.   Cardiovascular: Negative for chest pain, palpitations and leg swelling.   Gastrointestinal: Positive for diarrhea. Negative for abdominal pain, nausea and vomiting.   Endocrine: Negative for cold intolerance, heat intolerance, polydipsia and polyuria.   Genitourinary: Negative for dysuria, flank pain, hematuria, pelvic pain, vaginal bleeding and vaginal discharge.   Musculoskeletal: Negative for arthralgias, back pain, joint swelling, myalgias and neck pain.   Skin: Negative for rash and wound.   Allergic/Immunologic: Negative for environmental allergies.   Neurological: Negative for dizziness, tremors, seizures, syncope, weakness, light-headedness and headaches.   Hematological: Negative for adenopathy. Does not bruise/bleed easily.   Psychiatric/Behavioral: Negative for confusion and hallucinations.       Past Medical, Surgical, Family, and Social History     She has a past medical history of CHF (congestive heart failure) (HCC), Epidural abscess, GERD (gastroesophageal reflux disease), HTN (hypertension), IBS (irritable bowel syndrome), Lumbar radiculopathy, Morbid obesity (HCC), Muscle fasciculation,   Neuropathic pain, SBO (small bowel obstruction) (HCC), Spinal fracture of T8 vertebra (HCC), Surgical site  infection, and Urinary incontinence.  She has a past surgical history that includes Appendectomy; Breast lumpectomy (Bilateral); Hysterectomy, total abdominal; Cholecystectomy; Total knee arthroplasty (Right); and Cardiac catheterization (2012).  Her family history is not on file.  She reports that she has never smoked. She has never used smokeless tobacco. She reports that she does not drink alcohol or use drugs.    Medications     Previous Medications    CARVEDILOL (COREG) 6.25 MG TABLET  Take 1 tablet by mouth 2 times daily    CITALOPRAM (CELEXA) 40 MG TABLET  Take 40 mg by mouth daily    CLONIDINE (CATAPRES) 0.2 MG TABLET  Take 0.2 mg by mouth 2 times daily    FUROSEMIDE (LASIX) 80 MG TABLET  Take 0.5 tablets by mouth daily    GABAPENTIN (NEURONTIN) 300 MG CAPSULE  Take 300 mg by mouth 2 times daily.    LOPERAMIDE (IMODIUM) 2 MG CAPSULE  Take 2 mg by mouth 4 times daily as needed for Diarrhea    OMEPRAZOLE (PRILOSEC) 20 MG DELAYED RELEASE CAPSULE  Take 1 capsule  by mouth every morning (before breakfast)    POTASSIUM CHLORIDE (KLOR-CON M) 20 MEQ TBCR EXTENDED RELEASE TABLET  Take 40 mEq by mouth 2 times daily    VALSARTAN (DIOVAN) 80 MG TABLET  Take 1 tablet by mouth daily    VITAMIN D3 (CHOLECALCIFEROL) 25 MCG (1000 UT) TABS TABLET  Take 2,000 Units by mouth daily        Allergies     She is allergic to betadine [povidone iodine]; coconut oil; codeine; fish-derived products; ibuprofen; iodine; other; and sulfa antibiotics.    Physical Exam     INITIAL VITALS: BP: 125/69, Temp: 98.5 ?F (36.9 ?C), Pulse: 72, Resp: 20, SpO2: 100 %   Physical Exam  Constitutional:    General: She is not in acute distress.   Appearance: She is well-developed. She is not diaphoretic.   HENT:    Head: Normocephalic and atraumatic.    Mouth/Throat:    Pharynx: No oropharyngeal exudate.   Eyes:    General:    Right eye: No discharge.    Left eye: No discharge.    Conjunctiva/sclera: Conjunctivae normal.    Pupils: Pupils are equal,  round, and reactive to light.   Neck:    Musculoskeletal: Normal range of motion and neck supple.    Thyroid: No thyromegaly.    Vascular: No JVD.    Trachea: No tracheal deviation.   Cardiovascular:    Rate and Rhythm: Normal rate and regular rhythm.    Heart sounds: Normal heart sounds. No murmur. No friction rub. No gallop.   Pulmonary:    Effort: Pulmonary effort is normal. No respiratory distress.    Breath sounds: Normal breath sounds. No stridor. No wheezing or rales.   Chest:    Chest wall: No tenderness.   Abdominal:    General: Bowel sounds are normal. There is no distension.    Palpations: Abdomen is soft.    Tenderness: There is abdominal tenderness (Diffuse tenderness to palpation worse in right upper and right lower quadrant). There is no guarding or rebound.   Musculoskeletal: Normal range of motion.    General: No tenderness or deformity.   Lymphadenopathy:    Cervical: No cervical adenopathy.   Skin:   General: Skin is warm and dry.    Findings: No erythema or rash.   Neurological:    Mental Status: She is alert and oriented to person, place, and time.    Cranial Nerves: No cranial nerve deficit.    Coordination: Coordination normal.   Psychiatric:    Behavior: Behavior normal.         Diagnostic Results     EKG   none    RADIOLOGY:  CT ABDOMEN PELVIS WO CONTRAST Additional Contrast? Oral (Results Pending)       LABS:   Results for orders placed or performed during the hospital encounter of 10/21/19   CBC Auto Differential   Result Value Ref Range    WBC 3.4 (L) 4.0 - 11.0 K/uL    RBC 4.71 4.00 - 5.20 M/uL    Hemoglobin 12.9 12.0 - 16.0 g/dL    Hematocrit 09.8 11.9 - 48.0 %    MCV 87.4 80.0 - 100.0 fL    MCH 27.5 26.0 - 34.0 pg    MCHC 31.4 31.0 - 36.0 g/dL    RDW 14.7 (H) 82.9 - 15.4 %    Platelets 139 135 - 450 K/uL    MPV 9.1 5.0 - 10.5 fL    Neutrophils %  34.3 %    Lymphocytes % 38.5 %    Monocytes % 24.3 %    Eosinophils % 1.8 %    Basophils % 1.1 %    Neutrophils Absolute 1.2 (L) 1.7 - 7.7  K/uL    Lymphocytes Absolute 1.3 1.0 - 5.1 K/uL    Monocytes Absolute 0.8 0.0 - 1.3 K/uL    Eosinophils Absolute 0.1 0.0 - 0.6 K/uL    Basophils Absolute 0.0 0.0 - 0.2 K/uL   Basic Metabolic Panel w/ Reflex to MG   Result Value Ref Range    Sodium 137 136 - 145 mmol/L    Potassium reflex Magnesium 4.7 3.5 - 5.1 mmol/L    Chloride 102 99 - 110 mmol/L    CO2 26 21 - 32 mmol/L    Anion Gap 9 3 - 16    Glucose 108 (H) 70 - 99 mg/dL    BUN 15 7 - 20 mg/dL    CREATININE 1.4 (H) 0.6 - 1.2 mg/dL    GFR Non-African American 37 (A) >60    GFR African American 45 (A) >60    Calcium 8.7 8.3 - 10.6 mg/dL       ED BEDSIDE ULTRASOUND:  none    RECENT VITALS: BP: 138/73,Temp: 98.5 ?F (36.9 ?C), Pulse: 65, Resp: 18, SpO2: 100 %     Procedures     none    ED Course     Nursing Notes, Past Medical Hx, Past Surgical Hx, Social Hx,Allergies, and Family Hx were reviewed.    patient was given the following medications:  Orders Placed This Encounter   Medications   ? 0.9 % sodium chloride bolus   ? 0.9 % sodium chloride bolus   ? diphenhydrAMINE (BENADRYL) injection 25 mg   ? methylPREDNISolone sodium (SOLU-MEDROL) injection 125 mg       CONSULTS:  None    MEDICAL DECISIONMAKING / ASSESSMENT / PLAN     Veronica Winters is a 69 y.o. female who presents with a chief complaint of diarrhea. Initial exam reveals a well-appearing female in no acute distress with normal vitals, afebrile. Physical exam remarkable for diffuse abdominal tenderness to palpation.    Patient's presentation is concerning for possible C. difficile after recent antibiotics. Patient states she did take Imodium prior to arrival and is unclear if she will be able to provide a stool sample.    Patient overall appears well but has some mild tenderness to palpation of abdomen.    Labs revealed normal renal panel with the exception of mildly elevated creatinine at 1.4, left the hospital at 1.1 on 2/26. Has a history of chronic kidney disease. Normal white count, normal  hemoglobin. Repeat urinalysis pending.    I did opt to obtain a CT scan, and initially was going to do this with IV contrast prior to realizing the patient had chronic kidney disease -she had already received Benadryl and Solu-Medrol for premedication for IV contrast prior to me realizing that   her creatinine was up trending and that in context with her chronic kidney disease we may feel uncomfortable giving a new IV contrast dye. Therefore I did opt to obtain CT without contrast but with oral contrast to look for cause of diarrhea.    At this time I am going off service and oncoming provider will have to follow-up on CT imaging and urinalysis. Anticipate patient will be ultimately discharged home, but again this is pending reassessment.      Clinical  Impression     diarrhea    Disposition     PATIENT REFERRED TO:  No follow-up provider specified.    DISCHARGE MEDICATIONS:  New Prescriptions    No medications on file       DISPOSITION - pending re assessment       Toma Copier, MD  10/21/19 1452            ED Medication Orders (From admission, onward)    Start Ordered       Status Ordering Provider    10/21/19 1543 10/21/19 1543  iohexol (OMNIPAQUE 240) injection 50 mL IMG ONCE PRN      Last MAR action: Given - by Hiram Comber, ABAGAIL on 10/21/19 at 1543 Kaiah Hosea L    10/21/19 1400 10/21/19 1357  diphenhydrAMINE (BENADRYL) injection 25 mg ONCE      Last MAR action: Given - by Bea Graff on 10/21/19 at 1408 Markale Birdsell L    10/21/19 1400 10/21/19 1359  methylPREDNISolone sodium (SOLU-MEDROL) injection 125 mg ONCE      Last MAR action: Given - by Bea Graff on 10/21/19 at 1409 Reyansh Kushnir L    10/21/19 1245 10/21/19 1232  0.9 % sodium chloride bolus ONCE      Last MAR action: Stopped - by Lorne Skeens N on 10/21/19 at 1414 Puja Caffey L        Lab Results           Urinalysis, reflex to microscopic (Final result)         Collection Time  Result Time  COLOR U  CLARITY U  GLUCOSE U  BILI UR  KETONES U  SPEC  GRAV  BLOOD U  PH, UR  Protein, UA  Urobilinogen, Urine      10/21/19 15:39:00  10/21/19 15:58:00  Yellow  SL CLOUDY  Negative  Negative  Negative  1.015  Negative  6.5  Negative  0.2          Previous Results      10/14/19 12:13:00  10/14/19 12:37:00  Yellow  Clear  Negative  Negative  Negative  1.025  LARGE  6.0  100  0.2      10/11/19 21:35:00  10/11/19 23:03:00  Yellow  Clear  Negative  Negative  Negative  >=1.030  Negative  6.0  TRACE  0.2             Collection Time  Result Time  NITRITE  LEUKOCYTES, UR  Microscopic Examination  Urine Type  Urine Reflex to Culture      10/21/19 15:39:00  10/21/19 15:58:00  Negative  TRACE  YES  Voided            Previous Results      10/14/19 12:13:00  10/14/19 12:37:00  POSITIVE  SMALL  YES  NotGiven  Yes      10/11/19 21:35:00  10/11/19 23:03:00  Negative  TRACE  YES  Voided  Not Indicated                     Final result                        Narrative:      Performed at:  [facility]  [address] [place], [place] [address] Phone [phone]  Microscopic Urinalysis (Final result)         Collection Time  Result Time  WBC UA  RBC UA  Epithelial Cells, UA  BACTERIA      10/21/19 15:39:00  10/21/19 16:08:00  3-5  None seen  21-50  2+          Previous Results      10/14/19 12:13:00  10/14/19 12:35:00  51-100  >100  6-10  2+      10/11/19 21:35:00  10/11/19 23:03:00  3-5  0-2  21-50                       Final result                        Narrative:      Performed at:  [facility]  [address], [place], [place] [address] Phone [phone]                                     CBC Auto Differential (Final result)         Collection Time  Result Time  WBC  RBC  HGB  HCT  MCV  MCH  MCHC  RDW  PLT  MPV      10/21/19 13:12:00  10/21/19 13:27:00  3.4  4.71  12.9  41.1  87.4  27.5  31.4  15.9  139  9.1          Previous Results      10/15/19 05:42:00  10/15/19  06:42:00  4.9  4.59  12.6  39.7  86.3  27.5  31.8  15.9  123  9.8      10/14/19 05:23:00  10/14/19 06:10:00  3.9  4.30  11.9  37.4  87.1  27.6  31.7  16.2  114  9.5      10/13/19 05:24:00  10/13/19 06:58:00  4.2  4.48  12.2  40.0  89.4  27.2  30.4  16.0  117  9.7      10/12/19 04:45:00  10/12/19 06:15:00  5.2  4.45  12.4  38.8  87.2  27.8  31.9  15.8  112  9.5      10/11/19 14:32:00  10/11/19 15:32:00  6.7  5.11  14.0  45.3  88.6  27.4  30.9  16.1  137  9.5             Collection Time  Result Time  NEUTRO PCT  LYMPHO PCT  MONO PCT  EOS  BASOS PCT  NEUTRO ABS  LYMPHS ABS  MONOS ABS  EOS ABS  BASOS ABS      10/21/19 13:12:00  10/21/19 13:27:00  34.3  38.5  24.3  1.8  1.1  1.2  1.3  0.8  0.1  0.0          Previous Results      10/15/19 05:42:00  10/15/19 06:42:00  40.1  33.0  25.2  1.2  0.5  2.0  1.6  1.2  0.1  0.0      10/14/19 05:23:00  10/14/19 06:10:00  31.6  41.8  25.0  1.2  0.4  1.2  1.6  1.0  0.0  0.0      10/13/19 05:24:00  10/13/19 06:58:00  27.3  50.7  20.1  0.9  1.0  1.1  2.1  0.8  0.0  0.0      10/12/19 04:45:00  10/12/19 06:15:00  36.7  37.4  24.6  0.8  0.5  1.9  1.9  1.3  0.0  0.0      10/11/19 14:32:00  10/11/19 15:32:00  40.0  35.0  23.0  0.0  2.0  2.7  2.3  1.5  0.0  0.1                     Final result                        Narrative:      Performed at:  [facility]  [address] [place], [place] [address] Phone [phone]                                     Basic Metabolic Panel w/ Reflex to MG (Final result)         Collection Time  Result Time  NA  krflxmg  CL  CO2  ANION GAP  GLUCOSE  BUN  CREATININE  GFR Non-African American  GFR African American      10/21/19 13:12:00  10/21/19 13:39:00  137  4.7  102  26  9  108  15  1.4  37  >60 mL/min/1.1m2 EGFR, calc. for ages 83 and older using the  MDRD formula (not corrected for weight), is valid for stable  renal function.    45  Chronic Kidney Disease: less than 60 ml/min/1.73 sq.m.   Kidney Failure: less  than 15 ml/min/1.73 sq.m.  Results valid for patients 18 years and older.            Previous Results      10/15/19 05:42:00  10/15/19 07:36:00  140  4.3  104  27  9  91  10  1.1  49>60 mL/min/1.36m2 EGFR, calc. for ages 42 and older using the  MDRD formula (not corrected for weight), is valid for stable  renal function.    59Chronic Kidney Disease: less than 60 ml/min/1.73 sq.m.   Kidney Failure: less than 15 ml/min/1.73 sq.m.  Results valid for patients 18 years and older.        10/14/19 05:23:00  10/14/19 06:18:00  140  4.5  108  26  6  95  14  1.2  44>60 mL/min/1.45m2 EGFR, calc. for ages 49 and older using the  MDRD formula (not corrected for weight), is valid for stable  renal function.    54Chronic Kidney Disease: less than 60 ml/min/1.73 sq.m.   Kidney Failure: less than 15 ml/min/1.73 sq.m.  Results valid for patients 18 years and older.        10/13/19 05:24:00  10/13/19 07:52:00  137  4.4  105  20  12  83  24  1.6  32>60 mL/min/1.13m2 EGFR, calc. for ages 38 and older using the  MDRD formula (not corrected for weight), is valid for stable  renal function.    39Chronic Kidney Disease: less than 60 ml/min/1.73 sq.m.   Kidney Failure: less than 15 ml/min/1.73 sq.m.  Results valid for patients 18 years and older.        10/12/19 17:22:00  10/12/19 18:33:00  138  3.9  107  24  7  111  28  1.9  26>60  mL/min/1.30m2 EGFR, calc. for ages 72 and older using the  MDRD formula (not corrected for weight), is valid for stable  renal function.    32Chronic Kidney Disease: less than 60 ml/min/1.73 sq.m.   Kidney Failure: less than 15 ml/min/1.73 sq.m.  Results valid for patients 18 years and older.        10/12/19 04:45:00  10/12/19 05:44:00  137  4.0  102  23  12  88  36  3.2  14>60 mL/min/1.109m2 EGFR, calc. for ages 49 and older using the  MDRD formula (not corrected for weight), is valid for stable  renal function.    17Chronic Kidney Disease: less than 60 ml/min/1.73 sq.m.   Kidney Failure: less than 15 ml/min/1.73  sq.m.  Results valid for patients 18 years and older.               Collection Time  Result Time  CALCIUM      10/21/19 13:12:00  10/21/19 13:39:00  8.7          Previous Results      10/15/19 05:42:00  10/15/19 07:36:00  9.4      10/14/19 05:23:00  10/14/19 06:18:00  8.6      10/13/19 05:24:00  10/13/19 07:52:00  8.6      10/12/19 17:22:00  10/12/19 18:33:00  7.9      10/12/19 04:45:00  10/12/19 05:44:00  8.7                     Final result                        Narrative:      Performed at:  [facility]  [address], [place], [place] [address] Phone [phone]                                 Imaging Results          CT ABDOMEN PELVIS WO CONTRAST Additional Contrast? Oral (Final result)   Result time 10/21/19 16:04:08      Final result by Venda Rodes, MD (10/21/19 16:04:08)                        Impression:      1. No acute abnormality of the abdomen or pelvis.  2. Colonic diverticulosis. No diverticulitis.  3. Right renal cyst.                    Narrative:      EXAM: CT Abdomen and Pelvis without Contrast    INDICATION: abd pain    COMPARISON: October 13, 2019    TECHNIQUE: Multiplanar reformatted images of the abdomen and pelvis are provided for review. Up-to-date CT equipment and radiation dose reduction techniques were employed.    IV Contrast: None.  Oral Contrast: Yes.    FINDINGS:    Lung Bases: Calcified granuloma of the left lower lobe. There is atelectasis or scarring of the right lung base which is similar to the prior study.    Liver: Normal.    Gallbladder and Biliary Tree: There are no calcified gallstones. No intra- or extrahepatic biliary dilatation.    Pancreas: Normal.    Spleen: Normal.    Adrenal Glands: Normal.    Kidneys and Ureters: 3.2 cm right renal cortical cyst, unchanged.  There is no obstructing urolithiasis or hydronephrosis. The unenhanced appearance of the renal parenchyma is unremarkable.    Urinary Bladder: Normal.    Bowel:  Normal diameter, nonobstructed. Colonic diverticulosis is noted. There is no diverticulitis.    Reproductive Organs: No associated masses.    Lymph Nodes: No abnormally enlarged nodes.    Peritoneum/Retroperitoneum: No ascites or free air.    Vessels: Aorta and IVC without significant abnormality.    Abdominal Wall: Normal.    Bones: There are multilevel degenerative changes of the thoracolumbar spine, not significantly changed in the interval.    Other Findings: None.                                   ED Administered Medications from 10/21/2019 1211 to 10/21/2019 1749        Date/Time Order Dose Route Action Action by Comments     10/21/2019 1314 0.9 % sodium chloride bolus 1,000 mL Intravenous New Bag Mahala Menghini [place], [place]      10/21/2019 1414 0.9 % sodium chloride bolus 0 mL Intravenous Stopped Mahala Menghini [place], RN      10/21/2019 1408 diphenhydrAMINE (BENADRYL) injection 25 mg 25 mg Intravenous Given Bea Graff, RN      10/21/2019 1543 iohexol (OMNIPAQUE 240) injection 50 mL 50 mL Oral Given Abagail Hiram Comber      10/21/2019 1409 methylPREDNISolone sodium (SOLU-MEDROL) injection 125 mg 125 mg Intravenous Given Bea Graff, RN         ED Treatment Team     Provider Role From To Phone Pager    Toma Copier, MD Attending Provider 10/21/19 1228 10/21/19 [phone]         Code Onset/Outcome    None     Code Interventions/Drips/Airways    None     bmk  Diagnosis     Diagnosis Comment    Diarrhea, unspecified type         bmkbmk  ED Prescriptions     None      bmkbmk  Follow-up Information     Follow up With Specialties Details Why Contact Info    Dorice Lamas, MD  Schedule an appointment as soon as possible for a visit   [address]  [place] [address]  [phone]      [facility] [place] [facility] Emergency Department Emergency Medicine  If symptoms worsen [address] [place] [address]  [phone]        bmk  Discharge Instructions    None

## 2019-10-21 NOTE — Unmapped (Signed)
brYour patient was seen at a [facility]. Please go to [email or web address] to view information filed to your patient's chart in Epic.    If you need to view your patient's results prior to gaining access to Epic CareLink, please contact the [facility] where your patient was seen.         Veronica Winters, Veronica Winters #[id number] ([id number]) (69 y.o. F) PCP: Bunnie Domino [phone])        A01-01              ED Arrival Information     Expected Arrival Acuity    - 10/21/2019 12:10 PM 3-Urgent           Means of arrival Escorted by Service Admission type    Walk In Self Emergency Medicine Emergency           Arrival complaint    loose bowel movements, sent by pcp for poss cdiff        bmk  Chief Complaint     Complaint Comment    Diarrhea         ED Vitals    Date/Time Temp Pulse Resp BP SpO2 Weight Who   10/21/19 1300 -- 65 18 138/73 100 % -- LNB   10/21/19 1222 98.5 ?F (36.9 ?C) 72 20 125/69 100 % 289 lb (131.1 kg) SKL        bmk  Allergies (Review Complete on: 10/21/19)     Agent Severity Comments    Betadine [Povidone Iodine]      Coconut Oil      Codeine      Fish-derived Products      Ibuprofen      Iodine      Other  detergents    Sulfa Antibiotics          bmk  Medical History     Past Medical History           Date  Comments      CHF (congestive heart failure) (HCC) [I50.9]          HTN (hypertension) [I10]          Neuropathic pain [M79.2]    back 2/2 to spinal surgeries      Lumbar radiculopathy [M54.16]          Spinal fracture of T8 vertebra (HCC) [S22.069A]    s/p fusion       Surgical site infection [T81.49XA]    back, spinal hardware       IBS (irritable bowel syndrome) [K58.9]          GERD (gastroesophageal reflux disease) [K21.9]          SBO (small bowel obstruction) (HCC) [K56.609]  07/1999  with partial colectomy      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       `  ED Provider Notes     Toma Copier, MD 10/21/2019 2:52 PM                 [facility]  EMERGENCY DEPARTMENT ENCOUNTER          ATTENDING PHYSICIAN NOTE       Date of evaluation: 10/21/2019    Chief Complaint     Diarrhea      History of Present Illness     Veronica Winters is a 69 y.o. female who presents with a chief complaint of diarrhea. Patient has a history of chronic kidney disease stage IV, heart failure preserved ejection fraction, hypertension, obesity, GERD who presented with dizziness to the   hospital a week and a half ago, was found to be hypotensive and tachycardic with an AKI, and urinalysis suggestive of UTI. Patient was discharged home with cefdinir. She presents again today with 2 days of diarrhea. States that she has been feeling   better in terms of urinary symptoms, but that for the last 2 days she has had diarrhea, her first diarrhea bowel movement yesterday was really gross looking. Patient admits to some crampy abdominal pain immediately prior to having diarrhea and then   improvement in pain after having diarrhea. Denies fevers, chest pain, shortness of breath.    Review of Systems     Review of Systems   Constitutional: Negative for activity change, appetite change, chills and fever.   HENT: Negative for congestion, ear pain, facial swelling, nosebleeds, rhinorrhea and sore throat.   Eyes: Negative for photophobia, pain, discharge, itching and visual disturbance.   Respiratory: Negative for cough, chest  tightness, shortness of breath and wheezing.   Cardiovascular: Negative for chest pain, palpitations and leg swelling.   Gastrointestinal: Positive for diarrhea. Negative for abdominal pain, nausea and vomiting.   Endocrine: Negative for cold intolerance, heat intolerance, polydipsia and polyuria.   Genitourinary: Negative for dysuria, flank pain, hematuria, pelvic pain, vaginal bleeding and vaginal discharge.   Musculoskeletal: Negative for arthralgias, back pain, joint swelling, myalgias and neck pain.   Skin: Negative for rash and wound.   Allergic/Immunologic: Negative for environmental allergies.   Neurological: Negative for dizziness, tremors, seizures, syncope, weakness, light-headedness and headaches.   Hematological: Negative for adenopathy. Does not bruise/bleed easily.   Psychiatric/Behavioral: Negative for confusion and hallucinations.       Past Medical, Surgical, Family, and Social History     She has a past medical history of CHF (congestive heart failure) (HCC), Epidural abscess, GERD (gastroesophageal reflux disease), HTN (hypertension), IBS (irritable bowel syndrome), Lumbar radiculopathy, Morbid obesity (HCC), Muscle fasciculation,   Neuropathic pain, SBO (small bowel obstruction) (HCC), Spinal fracture of T8 vertebra (HCC), Surgical site infection, and Urinary incontinence.  She has a past surgical history that includes Appendectomy; Breast lumpectomy (Bilateral); Hysterectomy, total abdominal; Cholecystectomy; Total knee arthroplasty (Right); and Cardiac catheterization (2012).  Her family history is not on file.  She reports that she has never smoked. She has never used smokeless tobacco. She reports that she does not drink alcohol or use drugs.    Medications  Previous Medications    CARVEDILOL (COREG) 6.25 MG TABLET  Take 1 tablet by mouth 2 times daily    CITALOPRAM (CELEXA) 40 MG TABLET  Take 40 mg by mouth daily    CLONIDINE (CATAPRES) 0.2 MG TABLET  Take 0.2 mg by mouth 2 times  daily    FUROSEMIDE (LASIX) 80 MG TABLET  Take 0.5 tablets by mouth daily    GABAPENTIN (NEURONTIN) 300 MG CAPSULE  Take 300 mg by mouth 2 times daily.    LOPERAMIDE (IMODIUM) 2 MG CAPSULE  Take 2 mg by mouth 4 times daily as needed for Diarrhea    OMEPRAZOLE (PRILOSEC) 20 MG DELAYED RELEASE CAPSULE  Take 1 capsule by mouth every morning (before breakfast)    POTASSIUM CHLORIDE (KLOR-CON M) 20 MEQ TBCR EXTENDED RELEASE TABLET  Take 40 mEq by mouth 2 times daily    VALSARTAN (DIOVAN) 80 MG TABLET  Take 1 tablet by mouth daily    VITAMIN D3 (CHOLECALCIFEROL) 25 MCG (1000 UT) TABS TABLET  Take 2,000 Units by mouth daily        Allergies     She is allergic to betadine [povidone iodine]; coconut oil; codeine; fish-derived products; ibuprofen; iodine; other; and sulfa antibiotics.    Physical Exam     INITIAL VITALS: BP: 125/69, Temp: 98.5 ?F (36.9 ?C), Pulse: 72, Resp: 20, SpO2: 100 %   Physical Exam  Constitutional:    General: She is not in acute distress.   Appearance: She is well-developed. She is not diaphoretic.   HENT:    Head: Normocephalic and atraumatic.    Mouth/Throat:    Pharynx: No oropharyngeal exudate.   Eyes:    General:    Right eye: No discharge.    Left eye: No discharge.    Conjunctiva/sclera: Conjunctivae normal.    Pupils: Pupils are equal, round, and reactive to light.   Neck:    Musculoskeletal: Normal range of motion and neck supple.    Thyroid: No thyromegaly.    Vascular: No JVD.    Trachea: No tracheal deviation.   Cardiovascular:    Rate and Rhythm: Normal rate and regular rhythm.    Heart sounds: Normal heart sounds. No murmur. No friction rub. No gallop.   Pulmonary:    Effort: Pulmonary effort is normal. No respiratory distress.    Breath sounds: Normal breath sounds. No stridor. No wheezing or rales.   Chest:    Chest wall: No tenderness.   Abdominal:    General: Bowel sounds are normal. There is no distension.    Palpations: Abdomen is soft.    Tenderness: There is abdominal tenderness  (Diffuse tenderness to palpation worse in right upper and right lower quadrant). There is no guarding or rebound.   Musculoskeletal: Normal range of motion.    General: No tenderness or deformity.   Lymphadenopathy:    Cervical: No cervical adenopathy.   Skin:   General: Skin is warm and dry.    Findings: No erythema or rash.   Neurological:    Mental Status: She is alert and oriented to person, place, and time.    Cranial Nerves: No cranial nerve deficit.    Coordination: Coordination normal.   Psychiatric:    Behavior: Behavior normal.         Diagnostic Results     EKG   none    RADIOLOGY:  CT ABDOMEN PELVIS WO CONTRAST Additional Contrast? Oral (Results Pending)       LABS:   Results  for orders placed or performed during the hospital encounter of 10/21/19   CBC Auto Differential   Result Value Ref Range    WBC 3.4 (L) 4.0 - 11.0 K/uL    RBC 4.71 4.00 - 5.20 M/uL    Hemoglobin 12.9 12.0 - 16.0 g/dL    Hematocrit 16.1 09.6 - 48.0 %    MCV 87.4 80.0 - 100.0 fL    MCH 27.5 26.0 - 34.0 pg    MCHC 31.4 31.0 - 36.0 g/dL    RDW 04.5 (H) 40.9 - 15.4 %    Platelets 139 135 - 450 K/uL    MPV 9.1 5.0 - 10.5 fL    Neutrophils % 34.3 %    Lymphocytes % 38.5 %    Monocytes % 24.3 %    Eosinophils % 1.8 %    Basophils % 1.1 %    Neutrophils Absolute 1.2 (L) 1.7 - 7.7 K/uL    Lymphocytes Absolute 1.3 1.0 - 5.1 K/uL    Monocytes Absolute 0.8 0.0 - 1.3 K/uL    Eosinophils Absolute 0.1 0.0 - 0.6 K/uL    Basophils Absolute 0.0 0.0 - 0.2 K/uL   Basic Metabolic Panel w/ Reflex to MG   Result Value Ref Range    Sodium 137 136 - 145 mmol/L    Potassium reflex Magnesium 4.7 3.5 - 5.1 mmol/L    Chloride 102 99 - 110 mmol/L    CO2 26 21 - 32 mmol/L    Anion Gap 9 3 - 16    Glucose 108 (H) 70 - 99 mg/dL    BUN 15 7 - 20 mg/dL    CREATININE 1.4 (H) 0.6 - 1.2 mg/dL    GFR Non-African American 37 (A) >60    GFR African American 45 (A) >60    Calcium 8.7 8.3 - 10.6 mg/dL       ED BEDSIDE ULTRASOUND:  none    RECENT VITALS: BP: 138/73,Temp: 98.5  ?F (36.9 ?C), Pulse: 65, Resp: 18, SpO2: 100 %     Procedures     none    ED Course     Nursing Notes, Past Medical Hx, Past Surgical Hx, Social Hx,Allergies, and Family Hx were reviewed.    patient was given the following medications:  Orders Placed This Encounter   Medications   ? 0.9 % sodium chloride bolus   ? 0.9 % sodium chloride bolus   ? diphenhydrAMINE (BENADRYL) injection 25 mg   ? methylPREDNISolone sodium (SOLU-MEDROL) injection 125 mg       CONSULTS:  None    MEDICAL DECISIONMAKING / ASSESSMENT / PLAN     Naima A Prevost is a 69 y.o. female who presents with a chief complaint of diarrhea. Initial exam reveals a well-appearing female in no acute distress with normal vitals, afebrile. Physical exam remarkable for diffuse abdominal tenderness to palpation.    Patient's presentation is concerning for possible C. difficile after recent antibiotics. Patient states she did take Imodium prior to arrival and is unclear if she will be able to provide a stool sample.    Patient overall appears well but has some mild tenderness to palpation of abdomen.    Labs revealed normal renal panel with the exception of mildly elevated creatinine at 1.4, left the hospital at 1.1 on 2/26. Has a history of chronic kidney disease. Normal white count, normal hemoglobin. Repeat urinalysis pending.    I did opt to obtain a CT scan, and initially was going to  do this with IV contrast prior to realizing the patient had chronic kidney disease -she had already received Benadryl and Solu-Medrol for premedication for IV contrast prior to me realizing that   her creatinine was up trending and that in context with her chronic kidney disease we may feel uncomfortable giving a new IV contrast dye. Therefore I did opt to obtain CT without contrast but with oral contrast to look for cause of diarrhea.    At this time I am going off service and oncoming provider will have to follow-up on CT imaging and urinalysis. Anticipate patient will be  ultimately discharged home, but again this is pending reassessment.      Clinical Impression     diarrhea    Disposition     PATIENT REFERRED TO:  No follow-up provider specified.    DISCHARGE MEDICATIONS:  New Prescriptions    No medications on file       DISPOSITION - pending re assessment       Toma Copier, MD  10/21/19 1452            ED Medication Orders (From admission, onward)    Start Ordered       Status Ordering Provider    10/21/19 1400 10/21/19 1357  diphenhydrAMINE (BENADRYL) injection 25 mg ONCE      Last MAR action: Given - by Bea Graff on 10/21/19 at 1408 Kennedi Lizardo L    10/21/19 1400 10/21/19 1359  methylPREDNISolone sodium (SOLU-MEDROL) injection 125 mg ONCE      Last MAR action: Given - by Bea Graff on 10/21/19 at 1409 Krystalle Pilkington L    10/21/19 1245 10/21/19 1232  0.9 % sodium chloride bolus ONCE      Last MAR action: New Bag - by Lorne Skeens N on 10/21/19 at 1314 Emmajane Altamura L    10/21/19 1245 10/21/19 1235  0.9 % sodium chloride bolus ONCE      Acknowledged ADAN, ANDREW J        Lab Results           CBC Auto Differential (Final result)         Collection Time  Result Time  WBC  RBC  HGB  HCT  MCV  MCH  MCHC  RDW  PLT  MPV      10/21/19 13:12:00  10/21/19 13:27:00  3.4  4.71  12.9  41.1  87.4  27.5  31.4  15.9  139  9.1          Previous Results      10/15/19 05:42:00  10/15/19 06:42:00  4.9  4.59  12.6  39.7  86.3  27.5  31.8  15.9  123  9.8      10/14/19 05:23:00  10/14/19 06:10:00  3.9  4.30  11.9  37.4  87.1  27.6  31.7  16.2  114  9.5      10/13/19 05:24:00  10/13/19 06:58:00  4.2  4.48  12.2  40.0  89.4  27.2  30.4  16.0  117  9.7      10/12/19 04:45:00  10/12/19 06:15:00  5.2  4.45  12.4  38.8  87.2  27.8  31.9  15.8  112  9.5      10/11/19 14:32:00  10/11/19 15:32:00  6.7  5.11  14.0  45.3  88.6  27.4  30.9  16.1  137  9.5  Collection Time  Result Time  NEUTRO PCT  LYMPHO PCT  MONO PCT  EOS  BASOS PCT  NEUTRO ABS  LYMPHS ABS  MONOS ABS  EOS ABS  BASOS  ABS      10/21/19 13:12:00  10/21/19 13:27:00  34.3  38.5  24.3  1.8  1.1  1.2  1.3  0.8  0.1  0.0          Previous Results      10/15/19 05:42:00  10/15/19 06:42:00  40.1  33.0  25.2  1.2  0.5  2.0  1.6  1.2  0.1  0.0      10/14/19 05:23:00  10/14/19 06:10:00  31.6  41.8  25.0  1.2  0.4  1.2  1.6  1.0  0.0  0.0      10/13/19 05:24:00  10/13/19 06:58:00  27.3  50.7  20.1  0.9  1.0  1.1  2.1  0.8  0.0  0.0      10/12/19 04:45:00  10/12/19 06:15:00  36.7  37.4  24.6  0.8  0.5  1.9  1.9  1.3  0.0  0.0      10/11/19 14:32:00  10/11/19 15:32:00  40.0  35.0  23.0  0.0  2.0  2.7  2.3  1.5  0.0  0.1                     Final result                        Narrative:      Performed at:  [facility]  [address] [place], [place] [address] Phone [phone]                                     Basic Metabolic Panel w/ Reflex to MG (Final result)         Collection Time  Result Time  NA  krflxmg  CL  CO2  ANION GAP  GLUCOSE  BUN  CREATININE  GFR Non-African American  GFR African American      10/21/19 13:12:00  10/21/19 13:39:00  137  4.7  102  26  9  108  15  1.4  37  >60 mL/min/1.29m2 EGFR, calc. for ages 58 and older using the  MDRD formula (not corrected for weight), is valid for stable  renal function.    45  Chronic Kidney Disease: less than 60 ml/min/1.73 sq.m.   Kidney Failure: less than 15 ml/min/1.73 sq.m.  Results valid for patients 18 years and older.            Previous Results      10/15/19 05:42:00  10/15/19 07:36:00  140  4.3  104  27  9  91  10  1.1  49>60 mL/min/1.62m2 EGFR, calc. for ages 63 and older using the  MDRD formula (not corrected for weight), is valid for stable  renal function.    59Chronic Kidney Disease: less than 60 ml/min/1.73 sq.m.   Kidney Failure: less than 15 ml/min/1.73 sq.m.  Results valid for patients 18 years and older.        10/14/19 05:23:00  10/14/19 06:18:00  140  4.5  108  26  6  95  14  1.2  44>60 mL/min/1.61m2 EGFR, calc. for ages 61  and older using  the  MDRD formula (not corrected for weight), is valid for stable  renal function.    54Chronic Kidney Disease: less than 60 ml/min/1.73 sq.m.   Kidney Failure: less than 15 ml/min/1.73 sq.m.  Results valid for patients 18 years and older.        10/13/19 05:24:00  10/13/19 07:52:00  137  4.4  105  20  12  83  24  1.6  32>60 mL/min/1.58m2 EGFR, calc. for ages 3 and older using the  MDRD formula (not corrected for weight), is valid for stable  renal function.    39Chronic Kidney Disease: less than 60 ml/min/1.73 sq.m.   Kidney Failure: less than 15 ml/min/1.73 sq.m.  Results valid for patients 18 years and older.        10/12/19 17:22:00  10/12/19 18:33:00  138  3.9  107  24  7  111  28  1.9  26>60 mL/min/1.43m2 EGFR, calc. for ages 69 and older using the  MDRD formula (not corrected for weight), is valid for stable  renal function.    32Chronic Kidney Disease: less than 60 ml/min/1.73 sq.m.   Kidney Failure: less than 15 ml/min/1.73 sq.m.  Results valid for patients 18 years and older.        10/12/19 04:45:00  10/12/19 05:44:00  137  4.0  102  23  12  88  36  3.2  14>60 mL/min/1.106m2 EGFR, calc. for ages 87 and older using the  MDRD formula (not corrected for weight), is valid for stable  renal function.    17Chronic Kidney Disease: less than 60 ml/min/1.73 sq.m.   Kidney Failure: less than 15 ml/min/1.73 sq.m.  Results valid for patients 18 years and older.               Collection Time  Result Time  CALCIUM      10/21/19 13:12:00  10/21/19 13:39:00  8.7          Previous Results      10/15/19 05:42:00  10/15/19 07:36:00  9.4      10/14/19 05:23:00  10/14/19 06:18:00  8.6      10/13/19 05:24:00  10/13/19 07:52:00  8.6      10/12/19 17:22:00  10/12/19 18:33:00  7.9      10/12/19 04:45:00  10/12/19 05:44:00  8.7                     Final result                        Narrative:      Performed at:  [facility]  [address] [place], [place] [address] Phone  [phone]                                 Imaging Results    None     ED Administered Medications from 10/21/2019 1211 to 10/21/2019 1452        Date/Time Order Dose Route Action Action by Comments     10/21/2019 1314 0.9 % sodium chloride bolus 1,000 mL Intravenous New Bag Mahala Menghini [place], [place]      10/21/2019 1408 diphenhydrAMINE (BENADRYL) injection 25 mg 25 mg Intravenous Given Bea Graff, RN      10/21/2019 1409 methylPREDNISolone sodium (SOLU-MEDROL) injection 125 mg 125 mg Intravenous Given Bea Graff, RN  ED Treatment Team     Provider Role From To Phone Pager    Toma Copier, MD Attending Provider 10/21/19 [phone] -- [phone]         Code Onset/Outcome    None     Code Interventions/Drips/Airways    None     bmk  Diagnosis    None     bmkbmk  ED Prescriptions     None      bmkbmk  Follow-up Information    None     bmk  Discharge Instructions    None

## 2019-10-22 NOTE — Care Coordination-Inpatient (Signed)
Patient contacted regarding recent discharge and COVID-19 risk.   Care Transition Nurse/ Ambulatory Care Manager contacted the patient by telephone to perform post discharge assessment. Verified name and DOB with patient as identifiers.     Patient has following risk factors of: heart failure, chronic kidney disease and HTN, obesity and GERD. CTN/ACM reviewed discharge instructions, medical action plan and red flags related to discharge diagnosis. Reviewed and educated them on any new and changed medications related to discharge diagnosis.  Advised obtaining a 90-day supply of all daily and as-needed medications.     Education provided regarding infection prevention, and signs and symptoms of COVID-19 and when to seek medical attention with patient who verbalized understanding. Discussed exposure protocols and quarantine from [facility] Guidelines ???Are you at higher risk for severe illness 2019??? and given an opportunity for questions and concerns. The patient agrees to contact the COVID-19 hotline [phone] or PCP office for questions related to their healthcare. CTN/ACM provided contact information for future reference.    From CDC: Are you at higher risk for severe illness?    ??? Wash your hands often.  ??? Avoid close contact (6 feet, which is about two arm lengths) with people who are sick.  ??? Put distance between yourself and other people if COVID-19 is spreading in your community.  ??? Clean and disinfect frequently touched surfaces.  ??? Avoid all cruise travel and non-essential air travel.  ??? Call your healthcare professional if you have concerns about COVID-19 and your underlying condition or if you are sick.    For more information on steps you can take to protect yourself, see CDC's How to Protect Yourself    Pt will be further monitored by COVID Loop Team?? based on severity of symptoms and risk factors.    Patient states she forgot to get a "hat" from the ED when she left.  She is collecting stool samples and her  brother will return to the ED to obtain one for her.  Her diarrhea is better and she took imodium last pm.  Encouraged her to do a bland diet and drink plenty water.  Offered GWL and patient accepted.    Email:  [email or web address]    Plan  Enroll into [place]. Malloree Raboin, RN, BSN, [place]  [facility]  [phone]

## 2019-10-22 NOTE — Telephone Encounter (Signed)
Returned call to [facility] who stated patient was doing well when she saw her yesterday

## 2019-10-26 ENCOUNTER — Inpatient Hospital Stay
Admit: 2019-10-26 | Discharge: 2019-10-27 | Disposition: A | Discharge status: Home / Self Care | Payer: MEDICARE | Attending: Emergency Medicine

## 2019-10-26 DIAGNOSIS — R1011 Right upper quadrant pain: Secondary | ICD-10-CM

## 2019-10-26 MED ORDER — SODIUM CHLORIDE 0.9 % IV BOLUS
0.9 | Freq: Once | INTRAVENOUS | Status: AC
Start: 2019-10-26 — End: 2019-10-26
  Administered 2019-10-27: 01:00:00 1000 mL via INTRAVENOUS

## 2019-10-26 MED ORDER — ONDANSETRON HCL 4 MG/2ML IJ SOLN
4 MG/2ML | Freq: Once | INTRAMUSCULAR | Status: AC
Start: 2019-10-26 — End: 2019-10-26
  Administered 2019-10-27: 01:00:00 4 mg via INTRAVENOUS

## 2019-10-26 MED FILL — ONDANSETRON HCL 4 MG/2ML IJ SOLN: 4 MG/2ML | INTRAMUSCULAR | Qty: 2

## 2019-10-26 NOTE — ED Provider Notes (Signed)
[place] ENCOUNTER          PHYSICIAN ASSISTANT NOTE       Date of evaluation: 10/26/2019    Chief Complaint     Abdominal Pain (Abdominal pain with BMs that radiates into her back. States had diarrhea recently but not now. )      History of Present Illness     Gabrielle Wells is a 69 y.o. female with PMH of congestive heart failure, hypertension, and multiple abdominal surgeries such as cholecystectomy, appendectomy, and bowel resections due to bowel obstructions who presents with worsening abdominal pain and pain with bowel movements. Patient reports that every time she has a bowel movement she experiences cold sweats and feels as though she is going to pass out. She reports her abdominal pain and frequent bowel movements have been occurring for about two weeks. She reports the abdominal pain is present throughout her right side and describes the pain as throbbing and wrapping around to her back. She describes the pain as an 8/10 today and has taken oxycodone this morning with minimal relief. She reports 6-7 soft bowel movements per day and reports her last bowel movement was today at 3 pm. She denies melena, hematochezia, constipation and is not up to date with colonoscopy. She reports nausea with two episodes of emesis today. No fevers. She was recently admitted 2 weeks ago for dizziness and UTI- treated with antibiotics.     Review of Systems     Review of Systems   Constitutional: Positive for appetite change, chills and diaphoresis. Negative for fever.   HENT: Negative for sore throat.    Eyes: Negative for visual disturbance.   Respiratory: Positive for shortness of breath. Negative for cough, chest tightness, wheezing and stridor.    Cardiovascular: Negative for chest pain, palpitations and leg swelling.   Gastrointestinal: Positive for abdominal pain, diarrhea, nausea and vomiting. Negative for abdominal distention, anal bleeding, blood in stool and constipation.    Genitourinary: Negative for difficulty urinating, dysuria and hematuria.   Musculoskeletal: Positive for back pain.   Neurological: Positive for dizziness, weakness, light-headedness and headaches. Negative for syncope.   All other systems reviewed and are negative.      Past Medical, Surgical, Family, and Social History     She has a past medical history of CHF (congestive heart failure) ([place]), Epidural abscess, GERD (gastroesophageal reflux disease), HTN (hypertension), IBS (irritable bowel syndrome), Lumbar radiculopathy, Morbid obesity ([place]), Muscle fasciculation, Neuropathic pain, SBO (small bowel obstruction) ([place]), Spinal fracture of T8 vertebra ([place]), Surgical site infection, and Urinary incontinence.  She has a past surgical history that includes Appendectomy; Breast lumpectomy (Bilateral); Hysterectomy, total abdominal; Cholecystectomy; Total knee arthroplasty (Right); and Cardiac catheterization (2012).  Her family history is not on file.  She reports that she has never smoked. She has never used smokeless tobacco. She reports that she does not drink alcohol or use drugs.    Medications     Previous Medications    CARVEDILOL (COREG) 6.25 MG TABLET    Take 1 tablet by mouth 2 times daily    CITALOPRAM (CELEXA) 40 MG TABLET    Take 40 mg by mouth daily    CLONIDINE (CATAPRES) 0.2 MG TABLET    Take 0.2 mg by mouth 2 times daily    FUROSEMIDE (LASIX) 80 MG TABLET    Take 0.5 tablets by mouth daily    GABAPENTIN (NEURONTIN) 300 MG CAPSULE    Take 300 mg  by mouth 2 times daily.    LOPERAMIDE (IMODIUM) 2 MG CAPSULE    Take 2 mg by mouth 4 times daily as needed for Diarrhea    OMEPRAZOLE (PRILOSEC) 20 MG DELAYED RELEASE CAPSULE    Take 1 capsule by mouth every morning (before breakfast)    POTASSIUM CHLORIDE (KLOR-CON M) 20 MEQ TBCR EXTENDED RELEASE TABLET    Take 40 mEq by mouth 2 times daily    VALSARTAN (DIOVAN) 80 MG TABLET    Take 1 tablet by mouth daily    VITAMIN D3 (CHOLECALCIFEROL) 25 MCG (1000 UT)  TABS TABLET    Take 2,000 Units by mouth daily        Allergies     She is allergic to betadine [povidone iodine]; coconut oil; codeine; fish-derived products; ibuprofen; iodine; other; and sulfa antibiotics.    Physical Exam     INITIAL VITALS: BP: (!) 141/58,Temp: 98 ??F (36.7 ??C), Pulse: 86, Resp: 15, SpO2: 98 %   Physical Exam  Vitals signs and nursing note reviewed.   Constitutional:       Appearance: She is well-developed. She is obese. She is not ill-appearing, toxic-appearing or diaphoretic.   HENT:      Head: Normocephalic and atraumatic.      Mouth/Throat:      Mouth: Mucous membranes are moist.   Eyes:      Extraocular Movements: Extraocular movements intact.      Conjunctiva/sclera: Conjunctivae normal.   Cardiovascular:      Rate and Rhythm: Normal rate and regular rhythm.      Heart sounds: Normal heart sounds. No murmur. No gallop.    Pulmonary:      Breath sounds: Normal breath sounds. No stridor. No wheezing, rhonchi or rales.   Abdominal:      General: A surgical scar is present. Bowel sounds are normal. There is no distension.      Palpations: Abdomen is soft.      Tenderness: There is abdominal tenderness in the right upper quadrant, right lower quadrant and epigastric area. There is no guarding or rebound.      Comments: Surgical scar on midline, ecchymosis present along inferior aspect of  abdomen    Musculoskeletal:         General: No deformity.      Lumbar back: She exhibits tenderness and pain.      Comments: Surgical scar along lower spine. Lower back tender to palpation.   Skin:     General: Skin is warm and dry.   Neurological:      Mental Status: She is alert and oriented to person, place, and time.   Psychiatric:         Mood and Affect: Mood normal.         Behavior: Behavior normal.         Diagnostic Results     RADIOLOGY:  No orders to display       LABS:   Results for orders placed or performed during the hospital encounter of 10/26/19   Clostridium difficile toxin/antigen     Specimen: Stool   Result Value Ref Range    C.diff Toxin/Antigen       Negative for Clostridium difficile antigen and toxin  Normal Range: Negative     CBC Auto Differential   Result Value Ref Range    WBC 6.8 4.0 - 11.0 K/uL    RBC 4.88 4.00 - 5.20 M/uL    Hemoglobin 13.6 12.0 - 16.0  g/dL    Hematocrit 42.7 36.0 - 48.0 %    MCV 87.4 80.0 - 100.0 fL    MCH 27.9 26.0 - 34.0 pg    MCHC 31.9 31.0 - 36.0 g/dL    RDW 16.6 (H) 12.4 - 15.4 %    Platelets 171 135 - 450 K/uL    MPV 8.6 5.0 - 10.5 fL    Neutrophils % 51.0 %    Lymphocytes % 28.0 %    Monocytes % 21.0 %    Eosinophils % 0.0 %    Basophils % 0.0 %    Neutrophils Absolute 3.5 1.7 - 7.7 K/uL    Lymphocytes Absolute 1.9 1.0 - 5.1 K/uL    Monocytes Absolute 1.4 (H) 0.0 - 1.3 K/uL    Eosinophils Absolute 0.0 0.0 - 0.6 K/uL    Basophils Absolute 0.0 0.0 - 0.2 K/uL   Comprehensive Metabolic Panel w/ Reflex to MG   Result Value Ref Range    Sodium 139 136 - 145 mmol/L    Potassium reflex Magnesium 4.7 3.5 - 5.1 mmol/L    Chloride 104 99 - 110 mmol/L    CO2 24 21 - 32 mmol/L    Anion Gap 11 3 - 16    Glucose 94 70 - 99 mg/dL    BUN 20 7 - 20 mg/dL    CREATININE 1.5 (H) 0.6 - 1.2 mg/dL    GFR Non-African American 34 (A) >60    GFR African American 41 (A) >60    Calcium 9.7 8.3 - 10.6 mg/dL    Total Protein 7.7 6.4 - 8.2 g/dL    Albumin 4.1 3.4 - 5.0 g/dL    Albumin/Globulin Ratio 1.1 1.1 - 2.2    Total Bilirubin <0.2 0.0 - 1.0 mg/dL    Alkaline Phosphatase 71 40 - 129 U/L    ALT 12 10 - 40 U/L    AST 18 15 - 37 U/L    Globulin 3.6 g/dL   Lipase   Result Value Ref Range    Lipase 88.0 (H) 13.0 - 60.0 U/L   Urinalysis, reflex to microscopic   Result Value Ref Range    Color, UA Yellow Straw/Yellow    Clarity, UA Clear Clear    Glucose, Ur Negative Negative mg/dL    Bilirubin Urine Negative Negative    Ketones, Urine TRACE (A) Negative mg/dL    Specific Gravity, UA >=1.030 1.005 - 1.030    Blood, Urine Negative Negative    pH, UA 6.0 5.0 - 8.0    Protein, UA Negative  Negative mg/dL    Urobilinogen, Urine 0.2 <2.0 E.U./dL    Nitrite, Urine Negative Negative    Leukocyte Esterase, Urine Negative Negative    Microscopic Examination Not Indicated     Urine Type Voided        RECENT VITALS:  BP: 129/66, Temp: 98 ??F (36.7 ??C), Pulse: 104,Resp: 20, SpO2: 96 %     Procedures         ED Course     Nursing Notes, Past Medical Hx, Past Surgical Hx, Social Hx, Allergies, and Family Hx were reviewed.    The patient was given the followingmedications:  Orders Placed This Encounter   Medications   ??? 0.9 % sodium chloride bolus   ??? ondansetron (ZOFRAN) injection 4 mg   ??? oxyCODONE-acetaminophen (PERCOCET) 5-325 MG per tablet 1 tablet   ??? ondansetron (ZOFRAN ODT) 4 MG disintegrating tablet     Sig: Take 1  tablet by mouth every 8 hours as needed for Nausea     Dispense:  8 tablet     Refill:  0       CONSULTS:  None    MEDICAL DECISION MAKING / ASSESSMENT / PLAN     Gabrielle Wells is a 69 y.o. female with PMH of congestive heart failure, hypertension, and multiple abdominal surgeries such as cholecystectomy, appendectomy, and bowel resections due to bowel obstructions who presents with worsening right sided abdominal pain, nausea and diarrhea. Symptoms have been ongoing for the past 2 weeks. Patient endorses 6-7 non-bloody soft BM's daily. No f/c. She does describe presyncopal symptoms with having BM's. She was evaluated here 5 days ago and had negative w/u including CT scan. Patient also admitted 2 weeks ago for AKI and UTI. Physical exam reveals soft abdomen, mild tenderness in the right abdomen without rebound or guarding.    IV access was established. 1L bolus of normal saline, zofran and percocet given for patient's symptoms. Labs including CBC, BMP, Hepatic function panel, lipase were obtained, without acute findings. Creatinine stable.  UA negative for infection.  Patient provided stool sample. This was sent for culture. Cdiff resulted negative.  Patient had negative CT abdominal imaging  5 days ago. Overall low suspicion for SBO given persistent BM's. Low suspicion for acute abdominal infection requiring repeat imaging. Her labs are stable today. She was able to tolerate oral intake. I believe she is stable to follow up with PCP on outpatient basis. She is given return precautions.    This patient was also evaluated by the attending physician. All care plans were discussed and agreed upon.    Clinical Impression     1. Abdominal pain, unspecified abdominal location    2. Diarrhea, unspecified type        Disposition     PATIENT REFERRED TO:  Minna Antis, MD  [address]  [place] [address]  [phone]    Schedule an appointment as soon as possible for a visit       [facility] Emergency Department  [address]  [place] [address]  [phone]    If symptoms worsen      DISCHARGE MEDICATIONS:  New Prescriptions    ONDANSETRON (ZOFRAN ODT) 4 MG DISINTEGRATING TABLET    Take 1 tablet by mouth every 8 hours as needed for Nausea        DISPOSITION Decision To Discharge 10/26/2019 09:53:59 PM       Beatris Si, PA-C  10/26/19 2329       Annabell Howells, MD  10/28/19 [phone]

## 2019-10-26 NOTE — Telephone Encounter (Signed)
LISA NURSE FROM [facility] CALLED TO INFORM DR PT TOLD HER AFTER SHE HAD BM AROUND 2:30-2:45 PM TODAY, SHE STARTED HAVING BACK/ABD PAIN AND PAIN A WAS 7 OUT OF 10, PT ALSO FELT DIZZY, LIGHT HEADED, SOB, AND SWEATING. PT WAS INSTRUCTED TO GO TO ER, BUT REFUSED TO GO PER LISA. PER LISA PT'S SISTER [place] TOOK COLLECTED STOOL TO LAB THAT WAS PREVIOUSLY ORDERED. PT HAS APP 3-10 ALREADY. Gabrielle Wells [facility]  [phone] FOR ADDTIONAL INFORMATION.

## 2019-10-26 NOTE — Discharge Instructions (Addendum)
At home, stay hydrated by drinking plenty of fluids.  Continue medications as prescribed.  You may take zofran every 8 hours as needed for nausea.    Follow-up with your primary care provider. Call tomorrow.    Return to emergency department with new or worsening symptoms, including severe pain, intractable vomiting or inability to tolerate oral intake, fever, bloody stools.

## 2019-10-26 NOTE — ED Provider Notes (Signed)
ED Attending Attestation Note     Date of evaluation: 10/26/2019    This patient was seen by the APP.  I have seen and examined the patient, agree with the workup, evaluation, management and diagnosis. The care plan has been discussed. I was present for any procedures performed in the APP's note and have made edits to the note where appropriate.    My assessment reveals 69 y.o. female with history of multiple abdominal surgeries presenting for right-sided abdominal pain and diarrhea for multiple days.  Was seen here for similar 5 days ago and underwent CT scan in addition to labs.  She has had persistent diarrhea since that time.  Abdomen is soft and benign with some mild right-sided tenderness, no rebound or guarding.  After reviewing her previous CT scan, and given the fact that she is having persistent bowel movements including today, highly doubt SBO or other acute process that would require the need for repeat imaging.  We will repeat her labs and send stool studies as she has brought a sample today.         Annabell Howells, MD  10/26/19 2149

## 2019-10-27 ENCOUNTER — Ambulatory Visit: Payer: MEDICARE | Attending: Student in an Organized Health Care Education/Training Program | Primary: Internal Medicine

## 2019-10-27 LAB — URINALYSIS
Bilirubin Urine: NEGATIVE
Blood, Urine: NEGATIVE
Glucose, Ur: NEGATIVE mg/dL
Leukocyte Esterase, Urine: NEGATIVE
Nitrite, Urine: NEGATIVE
Protein, UA: NEGATIVE mg/dL
Specific Gravity, UA: >=1.03 (ref 1.005–1.030)
Urobilinogen, Urine: 0.2 E.U./dL (ref <2.0)
pH, UA: 6 (ref 5.0–8.0)

## 2019-10-27 LAB — COMPREHENSIVE METABOLIC PANEL W/ REFLEX TO MG FOR LOW K
ALT: 12 U/L (ref 10–40)
AST: 18 U/L (ref 15–37)
Albumin/Globulin Ratio: 1.1 (ref 1.1–2.2)
Albumin: 4.1 g/dL (ref 3.4–5.0)
Alkaline Phosphatase: 71 U/L (ref 40–129)
Anion Gap: 11 (ref 3–16)
BUN: 20 mg/dL (ref 7–20)
CO2: 24 mmol/L (ref 21–32)
Calcium: 9.7 mg/dL (ref 8.3–10.6)
Chloride: 104 mmol/L (ref 99–110)
Creatinine: 1.5 mg/dL — ABNORMAL HIGH (ref 0.6–1.2)
GFR African American: 41 — AB (ref >60)
GFR Non-African American: 34 — AB (ref >60)
Globulin: 3.6 g/dL
Glucose: 94 mg/dL (ref 70–99)
Potassium reflex Magnesium: 4.7 mmol/L (ref 3.5–5.1)
Sodium: 139 mmol/L (ref 136–145)
Total Bilirubin: <0.2 mg/dL (ref 0.0–1.0)
Total Protein: 7.7 g/dL (ref 6.4–8.2)

## 2019-10-27 LAB — CBC WITH AUTO DIFFERENTIAL
Basophils %: 0 %
Basophils Absolute: 0 10*3/uL (ref 0.0–0.2)
Eosinophils %: 0 %
Eosinophils Absolute: 0 10*3/uL (ref 0.0–0.6)
Hematocrit: 42.7 % (ref 36.0–48.0)
Hemoglobin: 13.6 g/dL (ref 12.0–16.0)
Lymphocytes %: 28 %
Lymphocytes Absolute: 1.9 10*3/uL (ref 1.0–5.1)
MCH: 27.9 pg (ref 26.0–34.0)
MCHC: 31.9 g/dL (ref 31.0–36.0)
MCV: 87.4 fL (ref 80.0–100.0)
MPV: 8.6 fL (ref 5.0–10.5)
Monocytes %: 21 %
Monocytes Absolute: 1.4 10*3/uL — ABNORMAL HIGH (ref 0.0–1.3)
Neutrophils %: 51 %
Neutrophils Absolute: 3.5 10*3/uL (ref 1.7–7.7)
Platelets: 171 10*3/uL (ref 135–450)
RBC: 4.88 M/uL (ref 4.00–5.20)
RDW: 16.6 % — ABNORMAL HIGH (ref 12.4–15.4)
WBC: 6.8 10*3/uL (ref 4.0–11.0)

## 2019-10-27 LAB — C DIFF TOXIN/ANTIGEN: C difficile Toxin, EIA: NEGATIVE

## 2019-10-27 LAB — LIPASE: Lipase: 88 U/L — ABNORMAL HIGH (ref 13.0–60.0)

## 2019-10-27 MED ORDER — OXYCODONE-ACETAMINOPHEN 5-325 MG PO TABS
5-325 MG | Freq: Once | ORAL | Status: AC
Start: 2019-10-27 — End: 2019-10-26
  Administered 2019-10-27: 03:00:00 1 via ORAL

## 2019-10-27 MED ORDER — ONDANSETRON 4 MG PO TBDP
4 MG | ORAL_TABLET | Freq: Three times a day (TID) | ORAL | 0 refills | Status: DC | PRN
Start: 2019-10-27 — End: 2020-03-07

## 2019-10-27 MED FILL — PERCOCET 5-325 MG PO TABS: 5-325 mg | ORAL | Qty: 1

## 2019-10-27 NOTE — Telephone Encounter (Signed)
Returned call to Nurse Lattie Haw advised patient will be assessed today

## 2019-10-27 NOTE — Care Coordination-Inpatient (Signed)
Call placed to patient for ED FU and she was not available.  LM and provided contact information.    Plan  Make second phone call    Riley Lam. Emberlynn Riggan, RN, BSN, [place]  [facility]  [phone]

## 2019-10-27 NOTE — Care Coordination-Inpatient (Signed)
Patient contacted regarding recent discharge and COVID-19 risk.     Care Transition Nurse/ Ambulatory Care Manager contacted the patient by telephone to perform post discharge assessment. Verified name and DOB with patient as identifiers.     Patient has following risk factors of: heart failure and HTN, epidural abscess, GERD, IBS, SBO. CTN/ACM reviewed discharge instructions, medical action plan and red flags related to discharge diagnosis. Reviewed and educated them on any new and changed medications related to discharge diagnosis.  Advised obtaining a 90-day supply of all daily and as-needed medications.     Education provided regarding infection prevention, and signs and symptoms of COVID-19 and when to seek medical attention with patient who verbalized understanding. Discussed exposure protocols and quarantine from [facility] Guidelines ???Are you at higher risk for severe illness 2019??? and given an opportunity for questions and concerns. The patient agrees to contact the COVID-19 hotline [phone] or PCP office for questions related to their healthcare. CTN/ACM provided contact information for future reference.    From CDC: Are you at higher risk for severe illness?    ??? Wash your hands often.  ??? Avoid close contact (6 feet, which is about two arm lengths) with people who are sick.  ??? Put distance between yourself and other people if COVID-19 is spreading in your community.  ??? Clean and disinfect frequently touched surfaces.  ??? Avoid all cruise travel and non-essential air travel.  ??? Call your healthcare professional if you have concerns about COVID-19 and your underlying condition or if you are sick.    For more information on steps you can take to protect yourself, see CDC's How to Protect Yourself    Pt will be further monitored by COVID Loop Team?? based on severity of symptoms and risk factors.    Patient returned my phone call.  She continues with abdominal pain but diarrhea is better.  She is able to eat and  drink.  She has not filled zofran ED prescription yet but said her brother will later today.  Patient is waiting for return phone call from Dr. Kandice Robinsons PCP office to give her an ED FU appointment.   Will extend GWL from prior ED visit.      Plan  ED FU in 2 weeks  Extend GWL    Riley Lam. Jahari Billy, RN, BSN, [place]  [facility]  [phone]

## 2019-10-28 LAB — GASTROINTESTINAL PANEL, MOLECULAR: GI Bacterial Pathogens By PCR: NOT DETECTED

## 2019-10-28 IMAGING — DX DG CHEST 1V PORT
4 series · 7 of 7 positions shown · non-contrast
Comparison: Chest radiograph 07/07/2018

CLINICAL DATA: 67-year-old and right arm PICC placement. Evaluate
PICC line placement.

EXAM:
PORTABLE CHEST 1 VIEW

[chest ap (1 of 4)]
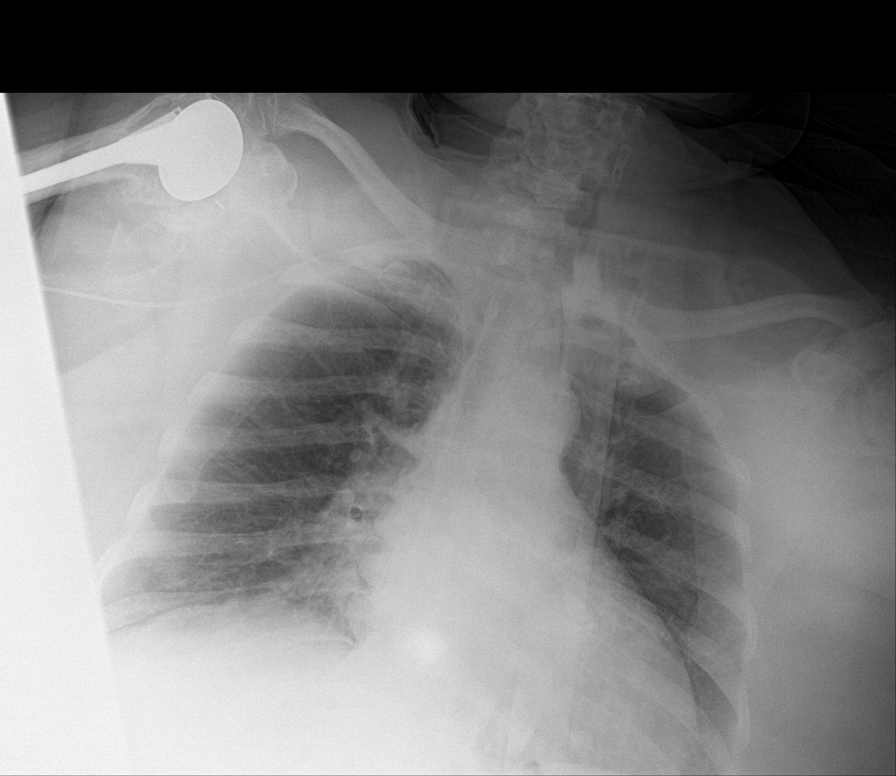

[Series 2: chest ap · 0.14mm/px · 2 of 2 slices shown (2 of 4)]
[im 1/2]
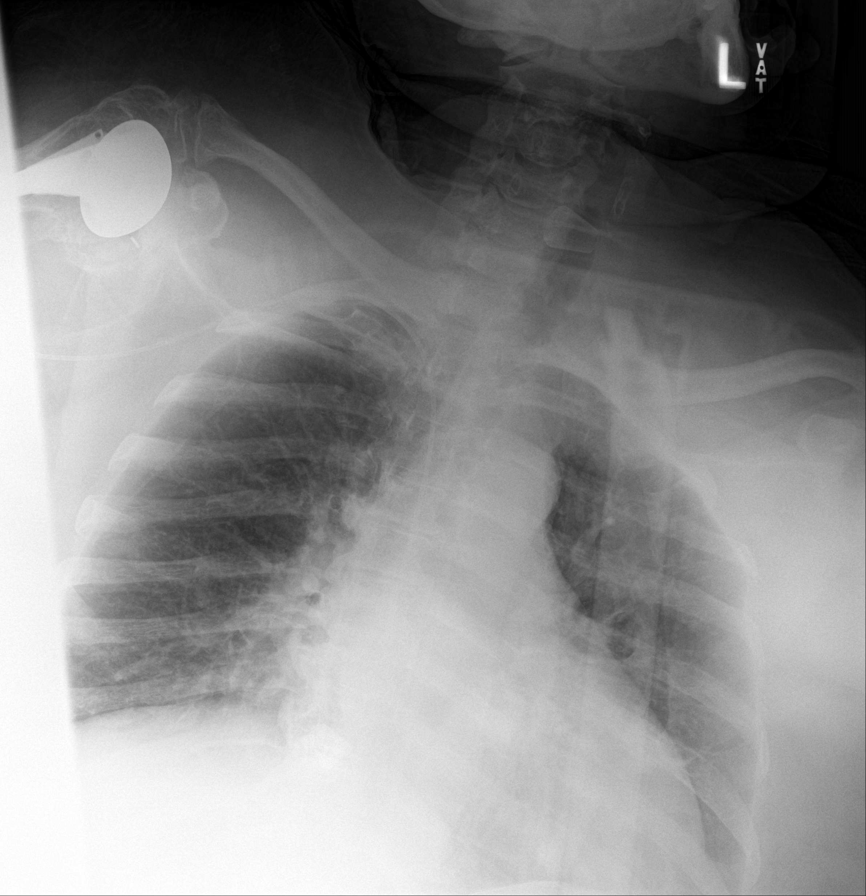
[im 2/2]
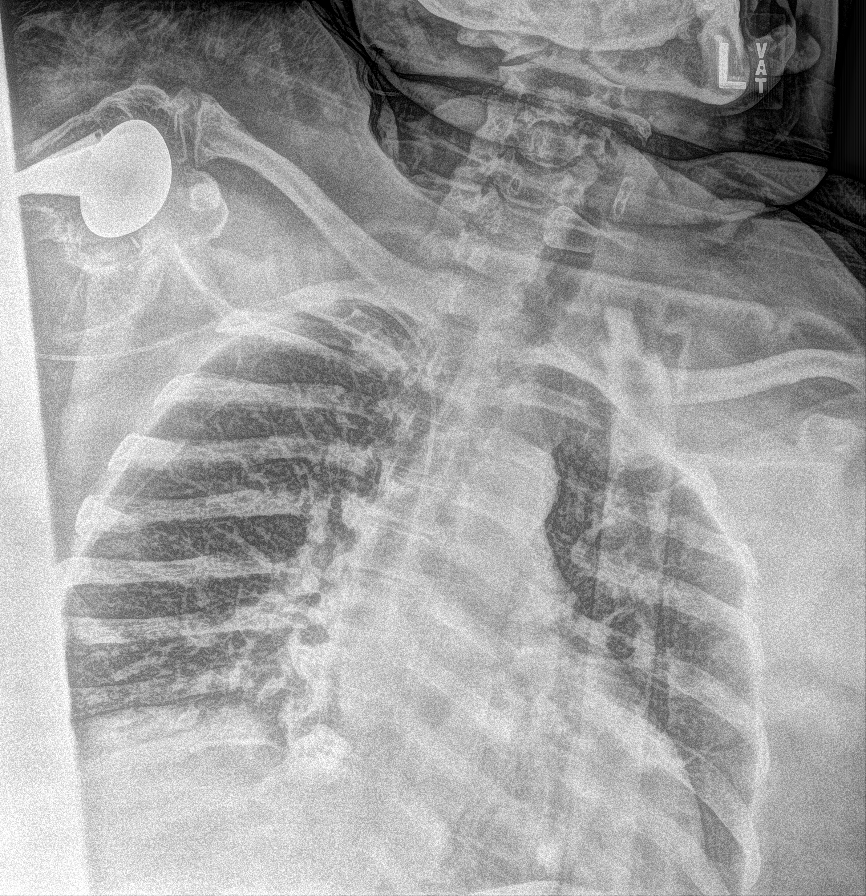

[Series 3: chest ap · 0.14mm/px · 2 of 2 slices shown (3 of 4)]
[im 1/2]
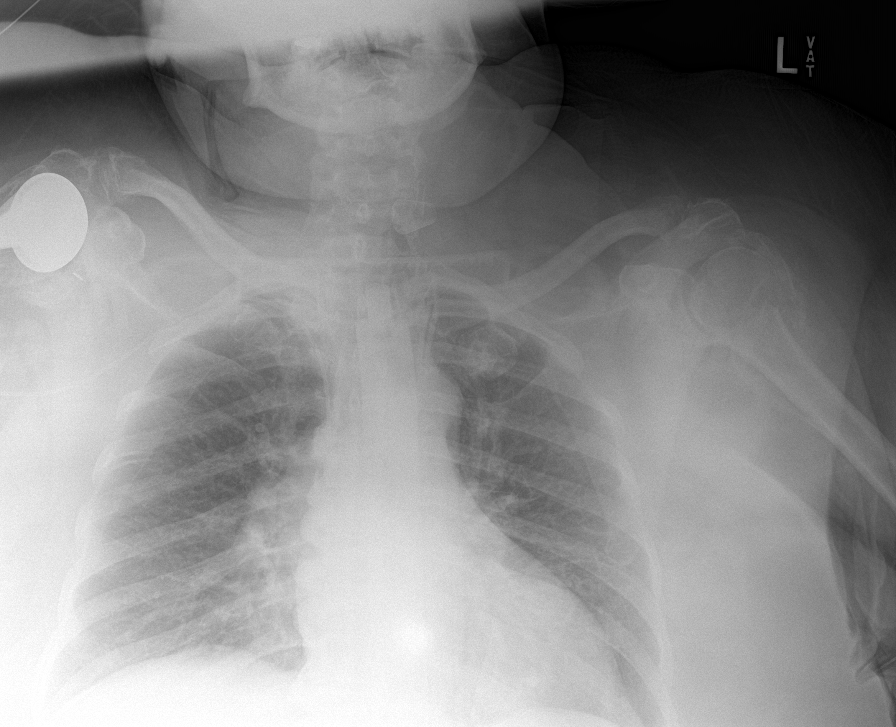
[im 2/2]
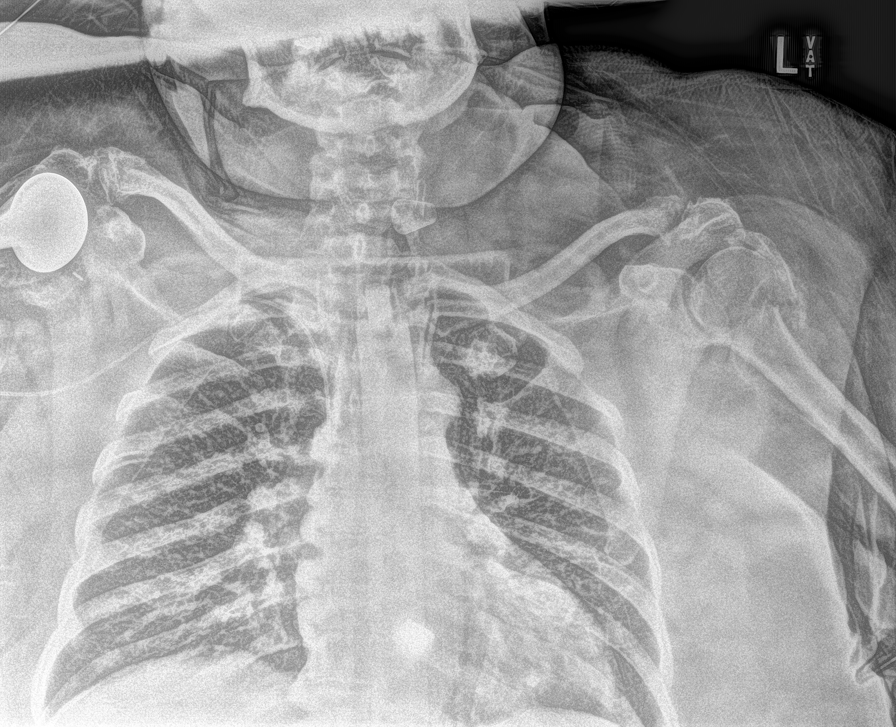

[Series 4: chest ap · 0.14mm/px · 2 of 2 slices shown (4 of 4)]
[im 1/2]
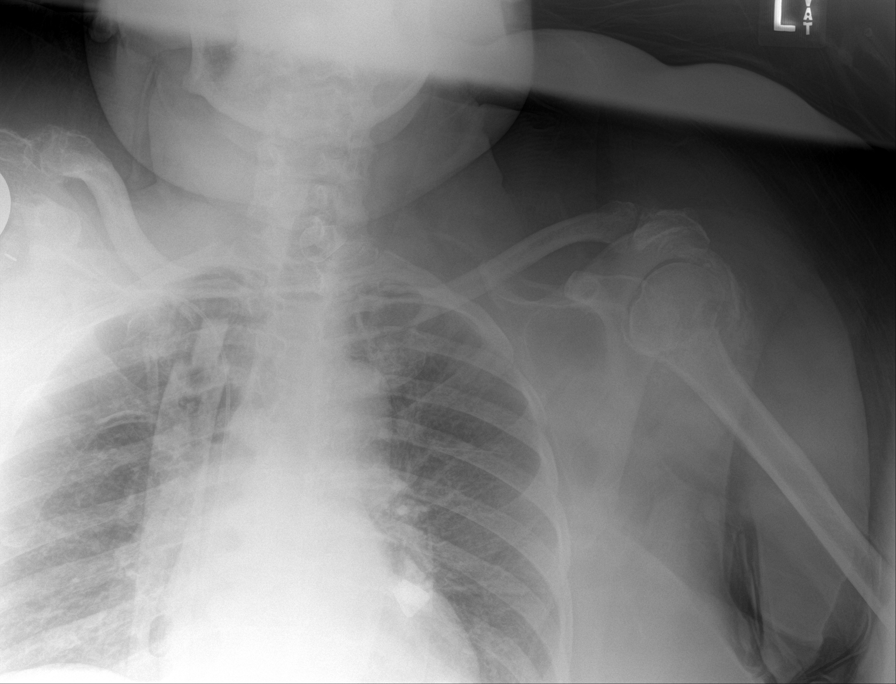
[im 2/2]
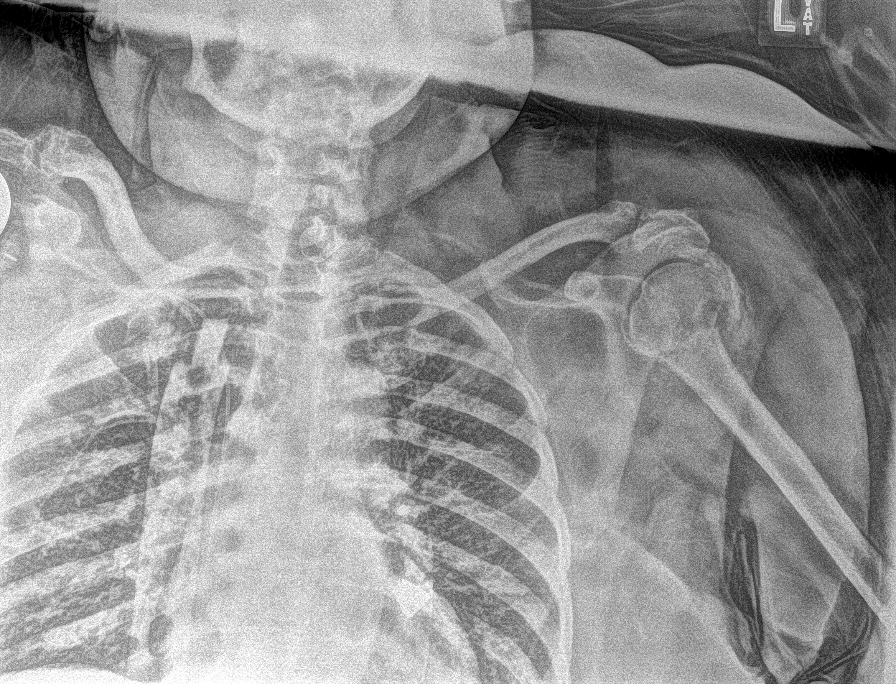

[7 of 7 positions shown; findings below may reference images not displayed]

FINDINGS: New right arm PICC line has been placed. The catheter tip appears to
extend into the lower SVC. Detail is limited on this examination due
to patient's body habitus. Visualized lungs are clear. Heart size is
within normal limits. Posttraumatic changes in the proximal left
humerus and patient has a right shoulder replacement.
IMPRESSION: Right arm PICC line tip appears to be in the lower SVC region.

## 2019-11-04 ENCOUNTER — Ambulatory Visit: Payer: MEDICARE | Primary: Internal Medicine

## 2019-11-05 NOTE — Care Coordination-Inpatient (Signed)
You Patient resolved from the Care Transitions episode on 10/22/19.      Patient/family has been provided the following resources and education related to COVID-19:                         Signs, symptoms and red flags related to COVID-19            CDC exposure and quarantine guidelines            [facility] exposure contact - [phone]            Contact for their local Department of Health                 Patient currently reports that the following symptoms have improved:  abdominal pain, back pain and diarrhea, pain or aching joints and no new/worsening symptoms.  Patient states she continues to experience abdominal pain.  She has FU appointment with her physician.     No further outreach scheduled with this CTN/ACM.  Episode of Care resolved.  Patient has this CTN/ACM contact information if future needs arise.    Riley Lam Tab Rylee, RN, BSN, [place]  [facility]  [phone]

## 2019-11-08 NOTE — Telephone Encounter (Signed)
Made Dr. Trilby Drummer aware of situation will be addressed on 11/10/19

## 2019-11-08 NOTE — Telephone Encounter (Signed)
[facility]  PH [phone], CALLED RE: [place]ong, [place] 08/13/2051, SUDY HAD VISIT WITH PT THIS MORNING 3-22 AND STATED PT TOLD HER HER BP HAS BEEN RUNNING IN 200'S ABOUT MIDDLE OF THE DAY, THEN PT WILL TAKE EXTRA BP MEDICATION BUT BP STILL STAY IN THE HIGH RANG .  TODAY HER BP WAS 142/84 THIS MORNING.  PT HAS APP FOR  3-24

## 2019-11-10 ENCOUNTER — Ambulatory Visit
Admit: 2019-11-10 | Payer: MEDICARE | Attending: Student in an Organized Health Care Education/Training Program | Primary: Internal Medicine

## 2019-11-10 DIAGNOSIS — K580 Irritable bowel syndrome with diarrhea: Secondary | ICD-10-CM

## 2019-11-10 MED ORDER — VITAMIN D3 25 MCG (1000 UT) PO TABS
25 MCG (1000 UT) | ORAL_TABLET | Freq: Every day | ORAL | 2 refills | Status: DC
Start: 2019-11-10 — End: 2020-05-01

## 2019-11-10 MED ORDER — POTASSIUM CHLORIDE ER 20 MEQ PO TBCR
20 MEQ | ORAL_TABLET | Freq: Two times a day (BID) | ORAL | 1 refills | Status: DC
Start: 2019-11-10 — End: 2019-12-08

## 2019-11-10 NOTE — Progress Notes (Signed)
11/10/2019    Gabrielle Wells (DOB:  04-27-1951) is a 69 y.o. female, here for a preventive medicine evaluation.    Patient Active Problem List   Diagnosis   ??? Lumbar radiculopathy   ??? Heart failure ([place])   ??? Essential hypertension   ??? GERD (gastroesophageal reflux disease)   ??? Wound of back   ??? Acute kidney injury ([name])     Pt presents for ED follow-up where she was seen for R sided abdominal pain and diarrhea. She was treated w/ IVF, antiemetics, and pain medication. Stool studies were negative, UA was unremarkable, kidney function was stable but elevated, LFTs were unremarkable, CBC was unremarkable, however, Lipase was mildly elevated to 88.     Today, feeling improved, but still having "soft mushy bowel movements" imodium helps and able to keep food and fluids down w/ zofran on board. No bloody BMs. Due for screening colonoscopy also.     Review of Systems   Constitutional: Negative for chills, fatigue and fever.   HENT: Negative for congestion, postnasal drip, sinus pressure, sinus pain, sore throat and trouble swallowing.    Eyes: Negative for photophobia and visual disturbance.   Respiratory: Negative for cough, chest tightness, shortness of breath and wheezing.    Cardiovascular: Negative for chest pain, palpitations and leg swelling.   Gastrointestinal: Positive for abdominal pain, diarrhea and nausea (imrpoved w/ zofran). Negative for abdominal distention, constipation and vomiting.   Genitourinary: Negative for difficulty urinating, dysuria, frequency, hematuria and urgency.   Musculoskeletal: Positive for back pain (chronic). Negative for arthralgias, neck pain and neck stiffness.   Skin: Negative.    Neurological: Negative for dizziness, seizures, weakness and numbness.   Psychiatric/Behavioral: Negative for confusion, decreased concentration and dysphoric mood.       Prior to Visit Medications    Medication Sig Taking? Authorizing Provider   ondansetron (ZOFRAN ODT) 4 MG disintegrating tablet Take  1 tablet by mouth every 8 hours as needed for Nausea  Beatris Si, PA-C   carvedilol (COREG) 6.25 MG tablet Take 1 tablet by mouth 2 times daily  Noreene Filbert, DO   furosemide (LASIX) 80 MG tablet Take 0.5 tablets by mouth daily  Noreene Filbert, DO   loperamide (IMODIUM) 2 MG capsule Take 2 mg by mouth 4 times daily as needed for Diarrhea  Historical Provider, MD   omeprazole (PRILOSEC) 20 MG delayed release capsule Take 1 capsule by mouth every morning (before breakfast)  Delmar Landau, MD   valsartan (DIOVAN) 80 MG tablet Take 1 tablet by mouth daily  Minna Antis, MD   cloNIDine (CATAPRES) 0.2 MG tablet Take 0.2 mg by mouth 2 times daily  Historical Provider, MD   gabapentin (NEURONTIN) 300 MG capsule Take 300 mg by mouth 2 times daily.  Historical Provider, MD   vitamin D3 (CHOLECALCIFEROL) 25 MCG (1000 UT) TABS tablet Take 2,000 Units by mouth daily   Historical Provider, MD   citalopram (CELEXA) 40 MG tablet Take 40 mg by mouth daily  Historical Provider, MD   potassium chloride (KLOR-CON M) 20 MEQ TBCR extended release tablet Take 40 mEq by mouth 2 times daily  Historical Provider, MD        Allergies   Allergen Reactions   ??? Betadine [Povidone Iodine]    ??? Coconut Oil    ??? Codeine    ??? Fish-Derived Products    ??? Ibuprofen    ??? Iodine    ??? Other      detergents   ???  Sulfa Antibiotics        Past Medical History:   Diagnosis Date   ??? CHF (congestive heart failure) ([place])    ??? 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 GERD (gastroesophageal reflux disease)    ??? HTN (hypertension)    ??? IBS (irritable bowel syndrome)    ??? Lumbar radiculopathy    ??? Morbid obesity ([place])    ??? Muscle fasciculation    ??? Neuropathic pain     back 2/2 to spinal surgeries   ??? SBO (small bowel obstruction) ([place]) 07/1999    with partial colectomy   ??? Spinal fracture of T8 vertebra (HCC)     s/p fusion    ??? Surgical site infection     back, spinal hardware    ??? Urinary incontinence      Past Surgical History:   Procedure  Laterality Date   ??? APPENDECTOMY     ??? BREAST LUMPECTOMY Bilateral     benign lesions   ??? CARDIAC CATHETERIZATION  2012   ??? CHOLECYSTECTOMY     ??? HYSTERECTOMY, TOTAL ABDOMINAL     ??? TOTAL KNEE ARTHROPLASTY Right        Social History     Socioeconomic History   ??? Marital status: Single     Spouse name: Not on file   ??? Number of children: Not on file   ??? Years of education: Not on file   ??? Highest education level: Not on file   Occupational History   ??? Not on file   Social Needs   ??? Financial resource strain: Not on file   ??? Food insecurity     Worry: Not on file     Inability: Not on file   ??? Transportation needs     Medical: Not on file     Non-medical: Not on file   Tobacco Use   ??? Smoking status: Never Smoker   ??? Smokeless tobacco: Never Used   Substance and Sexual Activity   ??? Alcohol use: Never     Frequency: Never   ??? Drug use: Never   ??? Sexual activity: Not on file   Lifestyle   ??? Physical activity     Days per week: Not on file     Minutes per session: Not on file   ??? Stress: Not on file   Relationships   ??? Social Product manager on phone: Not on file     Gets together: Not on file     Attends religious service: Not on file     Active member of club or organization: Not on file     Attends meetings of clubs or organizations: Not on file     Relationship status: Not on file   ??? Intimate partner violence     Fear of current or ex partner: Not on file     Emotionally abused: Not on file     Physically abused: Not on file     Forced sexual activity: Not on file   Other Topics Concern   ??? Not on file   Social History Narrative   ??? Not on file      No family history on file.    ADVANCE DIRECTIVE: N, <no information>    There were no vitals filed for this visit.  Estimated body mass index is 46.94 kg/m?? as calculated from the following:    Height as  of 10/26/19: 5\' 3"  (1.6 m).    Weight as of 10/26/19: 265 lb (120.2 kg).    Physical Exam  Vitals signs reviewed.   Constitutional:       General: She is not in acute  distress.     Appearance: Normal appearance. She is well-developed. She is obese. She is not diaphoretic.   HENT:      Head: Normocephalic and atraumatic.      Mouth/Throat:      Mouth: Mucous membranes are moist.      Pharynx: Oropharynx is clear. No oropharyngeal exudate.   Eyes:      General: No scleral icterus.     Extraocular Movements: Extraocular movements intact.      Pupils: Pupils are equal, round, and reactive to light.   Neck:      Musculoskeletal: Normal range of motion.      Trachea: No tracheal deviation.   Cardiovascular:      Rate and Rhythm: Normal rate and regular rhythm.      Pulses: Normal pulses.      Heart sounds: Normal heart sounds.   Pulmonary:      Effort: Pulmonary effort is normal. No respiratory distress.      Breath sounds: Normal breath sounds. No wheezing or rales.   Abdominal:      General: Bowel sounds are normal. There is no distension.      Palpations: Abdomen is soft.      Tenderness: There is abdominal tenderness (R sided TTP, worst RUQ).      Comments: Surgical scars   Musculoskeletal: Normal range of motion.         General: Tenderness (lower back) present. No swelling.      Right lower leg: No edema.      Left lower leg: No edema.   Skin:     General: Skin is warm and dry.      Findings: No erythema or rash.   Neurological:      Mental Status: She is alert and oriented to person, place, and time. Mental status is at baseline.      Cranial Nerves: No cranial nerve deficit.      Sensory: No sensory deficit.   Psychiatric:         Mood and Affect: Mood normal.         Behavior: Behavior normal.         Thought Content: Thought content normal.         Judgment: Judgment normal.       No flowsheet data found.    Lab Results   Component Value Date    CHOL 224 08/04/2019    TRIG 124 08/04/2019    HDL 62 08/04/2019    LDLCALC 137 08/04/2019    GLUCOSE 94 10/26/2019    LABA1C 5.7 08/04/2019       The 10-year ASCVD risk score Mikey Bussing DC Jr., et al., 2013) is: 13.6%    Values used to  calculate the score:      Age: 79 years      Sex: Female      Is Non-Hispanic African American: Yes      Diabetic: No      Tobacco smoker: No      Systolic Blood Pressure: Q000111Q mmHg      Is BP treated: Yes      HDL Cholesterol: 62 mg/dL      Total Cholesterol: 224 mg/dL    Immunization History  Administered Date(s) Administered   ??? Influenza, Quadv, IM, PF (6 mo and older Fluzone, Flulaval, Fluarix, and 3 yrs and older Afluria) 10/15/2019   ??? Pneumococcal Polysaccharide (Pneumovax23) 09/08/2019   ??? Tdap (Boostrix, Adacel) 09/08/2019       Health Maintenance   Topic Date Due   ??? COVID-19 Vaccine (1) Never done   ??? Shingles Vaccine (1 of 2) Never done   ??? Colon cancer screen colonoscopy  Never done   ??? Annual Wellness Visit (AWV)  Never done   ??? A1C test (Diabetic or Prediabetic)  08/03/2020   ??? Potassium monitoring  10/25/2020   ??? Creatinine monitoring  10/25/2020   ??? Breast cancer screen  09/19/2021   ??? Lipid screen  08/03/2024   ??? DTaP/Tdap/Td vaccine (2 - Td) 09/07/2029   ??? DEXA (modify frequency per FRAX score)  Completed   ??? Flu vaccine  Completed   ??? Pneumococcal 65+ years Vaccine  Completed   ??? Hepatitis C screen  Completed   ??? Hepatitis A vaccine  Aged Out   ??? Hepatitis B vaccine  Aged Out   ??? Hib vaccine  Aged Out   ??? Meningococcal (ACWY) vaccine  Aged Out     ASSESSMENT/PLAN:  1. Hospital discharge follow-up  -     PR DISCHARGE MEDS RECONCILED W/ CURRENT OUTPATIENT MED LIST  2. Diarrhea, unspecified type  -     potassium chloride (KLOR-CON M) 20 MEQ TBCR extended release tablet; Take 2 tablets by mouth 2 times daily, Disp-60 tablet, R-1Normal  -     AFL - Arman Filter, MD, Gastroenterology, [place]  - inflammatory markers negative in 08/2019, h/o bowel surgeries in past in different state  3. Serum lipase elevation  -     AFL - Arman Filter, MD, Gastroenterology, [place]  - mild 88, imaging w/o pancreatic abnormality  4. Morbid obesity ([place])  5. Chronic diastolic heart failure (HCC)  -      potassium chloride (KLOR-CON M) 20 MEQ TBCR extended release tablet; Take 2 tablets by mouth 2 times daily, Disp-60 tablet, R-1Normal  - advised to call Dr. Renata Caprice office for f/u visit as requested during hospital admission in 09/2019  6. Essential hypertension  - controlled on current meds  7. Vitamin D deficiency  -     vitamin D3 (CHOLECALCIFEROL) 25 MCG (1000 UT) TABS tablet; Take 2 tablets by mouth daily, Disp-30 tablet, R-2Normal  8. Healthcare Maintenance  - GI consult as above, screening colonoscopy needed  - R renal cyst along w/ CKD4, likely to need Nephrology  consult, if Cr not improved on repeat labs  - CMP, lipase after next visit  - Mild issues w/ urinary retention, may need Urology consult if still having issues  - discussed COVID-19 vaccine, encouraged her to get it    Return in 1 month (on 12/11/2019).   Follow-up of diarrhea and regular follow up visit    [facility] Dr. Trilby Drummer    An electronic signature was used to authenticate this note.    --Bebe Liter, MD on 11/10/2019 at 2:45 PM

## 2019-11-10 NOTE — Patient Instructions (Addendum)
Please call Dr. Keenan Bachelor (Cardiologist) office to schedule follow-up.     Hervey Ard, MD, [facility], [place]  [address]  [place] [address]  Ph: [phone]  Fax: [phone]      Plan to get your blood drawn after your next office visit in 29mo    We have referred you to Dr. Nancy Nordmann (GI) for your diarrhea and abdominal pain

## 2019-11-12 NOTE — Telephone Encounter (Signed)
Gabrielle Wells from [facility] called stating they will continue to see patient for a1x week for 3 weeks

## 2019-11-15 NOTE — Telephone Encounter (Signed)
JENNIFER FROM [facility] SKILLED CARE PT, PT WAS RE-EVAL TODAY AND JENNIFER WILL KEEP PT FOR ANOTHER 3WKS. NO PH # GIVEN

## 2019-11-15 NOTE — Telephone Encounter (Signed)
noted 

## 2019-11-15 NOTE — Addendum Note (Signed)
Addended by: Vernon Prey on: 11/15/2019 10:43 AM     Modules accepted: Level of Service

## 2019-11-30 NOTE — Telephone Encounter (Signed)
Returned call to [employer] advised that this med was never ordered, she brought it from [place] we only have historical. I will send message to Dr. Nelva Bush who will see patient on 12/15/19 to address

## 2019-11-30 NOTE — Telephone Encounter (Signed)
Called nurse who stated patient only has about 5 pills of the 0.3mg  if you would like for her to continue she will need a new scriipt sent to the pharmacy.

## 2019-11-30 NOTE — Telephone Encounter (Signed)
If she had been taking it since she left the hospital in February 2021, she can continue taking it and we will recheck at her next office visit. If she has not been taking it, and is not symptomatic we will address at next office visit. Please call pt and tell her to keep blood pressure log to bring to next visit, if BP remains elevated after taking scheduled medications, she will need to be seen sooner.    Electronically signed by Bebe Liter, MD on 11/30/2019 at 4:10 PM

## 2019-11-30 NOTE — Telephone Encounter (Signed)
HEATHER NURSE FROM [facility] SKILL[facility] C[facility] PH [phone] SEEN PT TODAY AND HER BP WAS HIGH 186/116 BUT SHE HAD NOT TAKEN HER MEDICATION YET, PT HAS CLONIDINE 0.3MG  AT HOME THAT SHE HAS BEEN TAKING ONCE DAILY, BUT THE MEDICATION WAS ORD AS 0.2MG  TWICE PER DAY. PLEASE CLARIFY MEDICATION,

## 2019-12-01 MED ORDER — CLONIDINE HCL 0.3 MG PO TABS
0.3 MG | ORAL_TABLET | Freq: Every day | ORAL | 0 refills | Status: DC
Start: 2019-12-01 — End: 2020-01-05

## 2019-12-01 NOTE — Telephone Encounter (Signed)
Clonidine 0.3mg  tab, once daily, Rx sent to pharmacy for 21d. Pt to see Dr. Keenan Bachelor (Cardiologist) on 4/21, will wait to make any additional changes until he is able to evaluate pt. Otherwise will address at upcoming office visit.    Electronically signed by Bebe Liter, MD on 12/01/2019 at 6:12 AM

## 2019-12-01 NOTE — Telephone Encounter (Signed)
Called Heather left message meds sent to pharmacy  per Dr. Nelva Bush

## 2019-12-06 NOTE — Telephone Encounter (Addendum)
Gabrielle Wells with nurse from [facility] who stated patient has B/Ps from the weekend ranging from 158/93 to 218/131. Called patient and made appt for Friday due to transportation

## 2019-12-08 ENCOUNTER — Ambulatory Visit
Admit: 2019-12-08 | Discharge: 2019-12-08 | Payer: MEDICARE | Attending: Cardiovascular Disease | Primary: Internal Medicine

## 2019-12-08 DIAGNOSIS — I10 Essential (primary) hypertension: Secondary | ICD-10-CM

## 2019-12-08 MED ORDER — CARVEDILOL 12.5 MG PO TABS
12.5 MG | ORAL_TABLET | Freq: Two times a day (BID) | ORAL | 3 refills | Status: DC
Start: 2019-12-08 — End: 2020-01-07

## 2019-12-08 NOTE — Progress Notes (Signed)
Cc: Resistant HTN, HFpEF    HPI:     Gabrielle Wells??is a 69 y.o.??female??with a past medical history of morbid obesity, HTN, HFpEF, CKD, GERD.??  ??  Patient was admitted at [facility] 2/22 - 10/15/19 for dehydration, AKI, sinus bradycardia (due to BB toxicity in the setting of AKI).     Echo 09/2019: nl LV, EF [id number], normal diastolic, normal valves and RV.     ECG 10/11/19: sinus brady 55, nonspecific changes, low voltage.     Renal duplex 09/2019: no RAS, indeterminate hypoechoic lesion R kidney.    Patient is here for a follow up. She reports resistant and severe HTN despite being compliant with her meds and low salt diet. Her BP at home 180-200s/100s, HR 90s. She admits to exertional dyspnea < 1 block, no other symptoms. She will be having an EGD soon, needs cardiac clearance.     Histories     Past Medical History:   has a past medical history of CHF (congestive heart failure) ([employer]), Epidural abscess, GERD (gastroesophageal reflux disease), HTN (hypertension), IBS (irritable bowel syndrome), Lumbar radiculopathy, Morbid obesity ([place]), Muscle fasciculation, Neuropathic pain, SBO (small bowel obstruction) ([place]), Spinal fracture of T8 vertebra ([place]), Surgical site infection, and Urinary incontinence.    Surgical History:   has a past surgical history that includes Appendectomy; Breast lumpectomy (Bilateral); Hysterectomy, total abdominal; Cholecystectomy; Total knee arthroplasty (Right); and Cardiac catheterization (2012).     Social History:   reports that she has never smoked. She has never used smokeless tobacco. She reports that she does not drink alcohol or use drugs.     Family History:  No evidence for sudden cardiac death or premature CAD      Medications:     Home medications were reviewed and are listed below    Prior to Admission medications    Medication Sig Start Date End Date Taking? Authorizing Provider   carvedilol (COREG) 12.5 MG tablet Take 1 tablet by mouth 2 times daily 12/08/19  Yes Hervey Ard, MD    cloNIDine (CATAPRES) 0.3 MG tablet Take 1 tablet by mouth daily 12/01/19  Yes Bebe Liter, MD   vitamin D3 (CHOLECALCIFEROL) 25 MCG (1000 UT) TABS tablet Take 2 tablets by mouth daily 11/10/19  Yes Bebe Liter, MD   ondansetron (ZOFRAN ODT) 4 MG disintegrating tablet Take 1 tablet by mouth every 8 hours as needed for Nausea 10/26/19  Yes Beatris Si, PA-C   furosemide (LASIX) 80 MG tablet Take 0.5 tablets by mouth daily 10/15/19  Yes Noreene Filbert, DO   loperamide (IMODIUM) 2 MG capsule Take 2 mg by mouth 4 times daily as needed for Diarrhea   Yes Historical Provider, MD   omeprazole (PRILOSEC) 20 MG delayed release capsule Take 1 capsule by mouth every morning (before breakfast) 09/08/19  Yes Delmar Landau, MD   valsartan (DIOVAN) 80 MG tablet Take 1 tablet by mouth daily 08/04/19  Yes Hina Virk, MD   gabapentin (NEURONTIN) 300 MG capsule Take 300 mg by mouth 2 times daily.   Yes Historical Provider, MD   citalopram (CELEXA) 40 MG tablet Take 40 mg by mouth daily   Yes Historical Provider, MD          Allergy:     Betadine [povidone iodine], Coconut oil, Codeine, Fish-derived products, Ibuprofen, Iodine, Other, and Sulfa antibiotics       Review of Systems:     All 12 point review of symptoms completed. Pertinent positives  identified in the HPI, all other review of symptoms negative as below.    CONSTITUTIONAL: No fatigue  SKIN: No rash or pruritis.  EYES: No visual changes or diplopia. No scleral icterus.  ENT: No Headaches, hearing loss or vertigo. No mouth sores or sore throat.  CARDIOVASCULAR: No chest pain/chest pressure/chest discomfort. No palpitations. No edema.   RESPIRATORY: + dyspnea. No cough or wheezing, no sputum production.  GASTROINTESTINAL: No N/V/D. No abdominal pain, appetite loss, blood in stools.  GENITOURINARY: No dysuria, trouble voiding, or hematuria.  MUSCULOSKELETAL:  No gait disturbance, weakness or joint complaints.  NEUROLOGICAL: No headache, diplopia, change in muscle strength,  numbness or tingling. No change in gait, balance, coordination, mood, affect, memory, mentation, behavior.  PSHYCH: No anxiety, loss of interest, change in sexual behavior, feelings of self-harm, or confusion.  ENDOCRINE: No excessive thirst, fluid intake, or urination. No tremor.  HEMATOLOGIC: No abnormal bruising or bleeding.  ALLERGY: No nasal congestion or hives.      Physical Examination:     Vitals:    12/08/19 1039 12/08/19 1045   BP: (!) 180/110 (!) 170/110   Pulse: 96    Weight: 286 lb 9.6 oz (130 kg)        Wt Readings from Last 3 Encounters:   12/08/19 286 lb 9.6 oz (130 kg)   11/10/19 285 lb 14.4 oz (129.7 kg)   10/26/19 265 lb (120.2 kg)         General Appearance:  Alert, cooperative, no distress, appears stated age Appropriate weight   Head:  Normocephalic, without obvious abnormality, atraumatic   Eyes:  PERRL, conjunctiva/corneas clear EOM intact  Ears normal   Throat no lesions       Nose: Nares normal, no drainage or sinus tenderness   Throat: Lips, mucosa, and tongue normal   Neck: Supple, symmetrical, trachea midline, no adenopathy, thyroid: not enlarged, symmetric, no tenderness/mass/nodules, no carotid bruit       Lungs:   Clear to auscultation bilaterally, respirations unlabored   Chest Wall:  No tenderness or deformity   Heart:  Regular rhythm, rate is controlled, S1, S2 normal, there is no murmur, there is no rub or gallop, no jvd, no bilateral lower extremity edema   Abdomen:   Soft, non-tender, bowel sounds active all four quadrants,  no masses, no organomegaly       Extremities: Extremities normal, atraumatic, no cyanosis   Pulses: 2+ and symmetric   Skin: Skin color, texture, turgor normal, no rashes or lesions   Pysch: Normal mood and affect   Neurologic: Normal gross motor and sensory exam.  Cranial nerves intact        Labs:     Lab Results   Component Value Date    WBC 6.8 10/26/2019    HGB 13.6 10/26/2019    HCT 42.7 10/26/2019    MCV 87.4 10/26/2019    PLT 171 10/26/2019     Lab  Results   Component Value Date    NA 139 10/26/2019    K 4.7 10/26/2019    CL 104 10/26/2019    CO2 24 10/26/2019    BUN 20 10/26/2019    CREATININE 1.5 (H) 10/26/2019    GLUCOSE 94 10/26/2019    CALCIUM 9.7 10/26/2019    PROT 7.7 10/26/2019    LABALBU 4.1 10/26/2019    BILITOT <0.2 10/26/2019    ALKPHOS 71 10/26/2019    AST 18 10/26/2019    ALT 12 10/26/2019  LABGLOM 34 (A) 10/26/2019    GFRAA 41 (A) 10/26/2019    AGRATIO 1.1 10/26/2019    GLOB 3.6 10/26/2019         Lab Results   Component Value Date    CHOL 224 (H) 08/04/2019     Lab Results   Component Value Date    TRIG 124 08/04/2019     Lab Results   Component Value Date    HDL 62 (H) 08/04/2019     Lab Results   Component Value Date    LDLCALC 137 (H) 08/04/2019     Lab Results   Component Value Date    LABVLDL 25 08/04/2019     No results found for: [facility]    Lab Results   Component Value Date    INR 1.03 10/11/2019    PROTIME 12.0 10/11/2019       The 10-year ASCVD risk score Mikey Bussing DC Jr., et al., 2013) is: 22.2%    Values used to calculate the score:      Age: 36 years      Sex: Female      Is Non-Hispanic African American: Yes      Diabetic: No      Tobacco smoker: No      Systolic Blood Pressure: 123XX123 mmHg      Is BP treated: Yes      HDL Cholesterol: 62 mg/dL      Total Cholesterol: 224 mg/dL      Assessment / Plan:      Diagnosis Orders   1. Resistant hypertension  Cortisol AM, Total    Catecholamines Free Urine    Renin Activity and Aldosterone   2. Chronic diastolic heart failure ([place])          1. Resistant HTN:   Patient reports compliance with meds and diet. It could be secondary HTN.     -Will obtain an am cortisol, urinary catecholamines and renin/aldosterone activity  -Renal duplex was negative for renal artery stenosis.   -Increase coreg to 12.5 bid  -Possibly increasing valsartan next time.   -Check BP and HR frequently  -C/w clonidine 0.3 mg daily and valsartan 80 daily for now.     2. Chronic HFpEF:   Patient appears euvolemic but  remains hypertensive.     -BP control as above.   -Low salt diet  -Cw lasix 40 daily    3. Preop evaluation:   Patient will be low risk for any periprocedural CV complications (EGD) when BP is better controlled.     4. Right renal lesion:   It is likely a cyst, however, I cannot exclude malignancy (indeterminate per [name]).     - I asked patient to follow with her PCP for further testing.         Return in about 4 weeks (around 01/05/2020).      I have spent 40 minutes of face to face time with the patient with more than 50% spent counseling and coordinating care.       I have personally reviewed the reports and images of labs, radiological studies, cardiac studies including ECG's and telemetry, current and old medical records. The note was completed using EMR and Dragon dictation system. Every effort was made to ensure accuracy; however, inadvertent computerized transcription errors may be present.    All questions and concerns were addressed to the patient/family. Alternatives to my treatment were discussed.     I would like to thank you for providing  me the opportunity to participate in the care of your patient. If you have any questions, please do not hesitate to contact me.     Hervey Ard, MD, [facility], [place]  [address]  [place] [address]  Main Office Phone: [phone]  Heart Failure Hotline: [phone]  Fax: [phone]

## 2019-12-10 ENCOUNTER — Ambulatory Visit
Admit: 2019-12-10 | Payer: MEDICARE | Attending: Student in an Organized Health Care Education/Training Program | Primary: Internal Medicine

## 2019-12-10 DIAGNOSIS — I10 Essential (primary) hypertension: Secondary | ICD-10-CM

## 2019-12-10 MED ORDER — GABAPENTIN 300 MG PO CAPS
300 MG | ORAL_CAPSULE | Freq: Three times a day (TID) | ORAL | 2 refills | Status: DC
Start: 2019-12-10 — End: 2020-02-03

## 2019-12-10 MED ORDER — TRAZODONE HCL 50 MG PO TABS
50 MG | ORAL_TABLET | Freq: Every evening | ORAL | 1 refills | Status: DC
Start: 2019-12-10 — End: 2020-12-14

## 2019-12-10 MED ORDER — CITALOPRAM HYDROBROMIDE 40 MG PO TABS
40 MG | ORAL_TABLET | Freq: Every day | ORAL | 0 refills | Status: DC
Start: 2019-12-10 — End: 2020-02-15

## 2019-12-10 NOTE — Progress Notes (Signed)
Department Of Internal Medicine     General Medicine/Primary Care      Established Patient Visit    Patient:  Gabrielle Wells                                               DOB: 06-Mar-1951  Age: 69 y.o.  MRN: [id number]  Date : 12/10/2019    History Obtained From:  Patient    CHIEF COMPLAINT:  Follow up    HISTORY OF PRESENT ILLNESS:   The patient is a 69 y.o. female who presents for follow up. Patient states that her blood pressure has been elevated to the 123456 systolic recently. She endorsed a headache and blurred vision during that episode.    Patient's BP today was 146/88.     Patient has complaints of not sleeping at night. She has gotten a sleep study done back in 2017 in [place] and did not show obstructive sleep apnea.     Past Medical History:        Diagnosis Date   ??? CHF (congestive heart failure) ([place])    ??? 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 GERD (gastroesophageal reflux disease)    ??? HTN (hypertension)    ??? IBS (irritable bowel syndrome)    ??? Lumbar radiculopathy    ??? Morbid obesity ([place])    ??? Muscle fasciculation    ??? Neuropathic pain     back 2/2 to spinal surgeries   ??? SBO (small bowel obstruction) ([place]) 07/1999    with partial colectomy   ??? Spinal fracture of T8 vertebra ([facility])     s/p fusion    ??? Surgical site infection     back, spinal hardware    ??? Urinary incontinence        Past Surgical History:        Procedure Laterality Date   ??? APPENDECTOMY     ??? BREAST LUMPECTOMY Bilateral     benign lesions   ??? CARDIAC CATHETERIZATION  2012   ??? CHOLECYSTECTOMY     ??? HYSTERECTOMY, TOTAL ABDOMINAL     ??? TOTAL KNEE ARTHROPLASTY Right        Family History:   No family history on file.    Social History:   TOBACCO:   reports that she has never smoked. She has never used smokeless tobacco.  ETOH:   reports no history of alcohol use.  OCCUPATION:      Allergies:  Betadine [povidone iodine], Coconut oil, Codeine, Fish-derived products, Ibuprofen, Iodine, Other, and Sulfa  antibiotics    Current Medications:    Prior to Admission medications    Medication Sig Start Date End Date Taking? Authorizing Provider   traZODone (DESYREL) 50 MG tablet Take 1 tablet by mouth nightly 12/10/19  Yes Hyman Bower, DO   gabapentin (NEURONTIN) 300 MG capsule Take 1 capsule by mouth 3 times daily for 90 days. 12/10/19 03/09/20 Yes Hyman Bower, DO   citalopram (CELEXA) 40 MG tablet Take 1 tablet by mouth daily 12/10/19  Yes Hyman Bower, DO   carvedilol (COREG) 12.5 MG tablet Take 1 tablet by mouth 2 times daily  Patient taking differently: Take 12.5 mg by mouth 4 times daily  12/08/19  Yes Hervey Ard, MD   cloNIDine (CATAPRES) 0.3 MG  tablet Take 1 tablet by mouth daily 12/01/19  Yes Bebe Liter, MD   vitamin D3 (CHOLECALCIFEROL) 25 MCG (1000 UT) TABS tablet Take 2 tablets by mouth daily 11/10/19  Yes Bebe Liter, MD   ondansetron (ZOFRAN ODT) 4 MG disintegrating tablet Take 1 tablet by mouth every 8 hours as needed for Nausea 10/26/19  Yes Beatris Si, PA-C   furosemide (LASIX) 80 MG tablet Take 0.5 tablets by mouth daily 10/15/19  Yes Noreene Filbert, DO   loperamide (IMODIUM) 2 MG capsule Take 2 mg by mouth 4 times daily as needed for Diarrhea   Yes Historical Provider, MD   omeprazole (PRILOSEC) 20 MG delayed release capsule Take 1 capsule by mouth every morning (before breakfast) 09/08/19  Yes Delmar Landau, MD   valsartan (DIOVAN) 80 MG tablet Take 1 tablet by mouth daily 08/04/19  Yes Hina Virk, MD       REVIEW OF SYSTEMS:  ?? CONSTITUTIONAL: NO Recent weight changes,fatigue,fever,chills or night sweats   ?? EYES: NO blurriness,tearing,itching or acute change in vision   ?? EARS: NO hearing loss,tinnitus,vertigo,discharge or earache.   ?? NOSE: NO rhinorrhea,sneezing,itching,allergy or epistaxis   ?? MOUTH/THROAT: NO bleeding gums,hoarseness or sore throat.   ?? RESP: NO SOB,wheeze,cough,sputum,hemoptysis or bronchitis   ?? CVS: NO chest pain,palpitations,dyspnea on  exertion,orthopnea,paroxysmal nocturnal dyspnea or edema   ?? GI: NO appetite changes, nausea,vomiting,or diarrhea,indigestion,dysphagia,change in bowel movements, or abdominal pain.   ?? GU: NO Urinary frequency, hesitancy, urgency, polyuria, dysuria, hematuria, nocturia, or incontinence.   ?? HEM/LYMPH: NO Anemia,bleeding tendency   ?? MSK: NO New myalgias,bone pain,joint pain,swelling or stiffness and has had no change in gait.   ?? NEURO: NO LOC, memory loss, forgetfulness, periods of confusion, difficulty concentrating, seizures, decline in intellect, nervousness, insomina, aphasia, or dysarthria.   ?? SKIN : NO skin or hair changes,and has no itching,rashes,sores.Denies breast lumps,masses,pain or discharge.   ?? PSYCH: Neg depression,personality changes,anxiety.   ?? ENDO: Neg polydipsia,polyuria,abnormal weight changes,heat /cold intolerance.   ?? ALL/IMM : Neg reactions to drugs other than listed,food,insects,skin rash,trouble breathing,local or general lymph node enlargement or tenderness.    Physical Exam:      Vitals: BP (!) 146/88 (Site: Left Lower Arm, Position: Sitting, Cuff Size: Medium Adult)    Pulse 88    Temp 97.2 ??F (36.2 ??C) (Temporal)    Ht 5\' 3"  (1.6 m)    Wt 284 lb 9.6 oz (129.1 kg)    SpO2 97%    BMI 50.41 kg/m??     Body mass index is 50.41 kg/m??.  Wt Readings from Last 3 Encounters:   12/10/19 284 lb 9.6 oz (129.1 kg)   12/08/19 286 lb 9.6 oz (130 kg)   11/10/19 285 lb 14.4 oz (129.7 kg)       ?? Gen - Alert, no signs of distress, appears stated age and cooperative  ?? HEENT - NC/AT  ?? Eye - Normal external eye, conjunctiva, lids cornea, PERLLA, no nystagmus  ?? Ears - Normal TM's bilaterally. Normal auditory canals and external ears. Non-tender  ?? Nose - Normal external nose, mucus membranes and septum  ?? Pharynx - Dental Hygiene adequate. Normal buccal mucosa. Normal pharynx  ?? Neck / Thyroid - Supple, no masses, nodes, nodules or enlargement. No adenopathy, no carotid bruit, no JVD, supple,  symmetrical, trachea midline and thyroid not enlarged, symmetric, no tenderness/mass/nodules  ?? CVS - Regular rate. Regular rhythm. No mumur or rub. No edema  ?? Resp -  No accessory muscle use. No crackles. No wheezing. No rhonchi  ?? GI - Non-tender. Non-distended. No masses. No organmegaly. Normal bowel sounds  ?? Skin - Warm and dry. No nodule on exposed extremities. No rash on exposed extremities  ?? Lymph - No cervical LAD. No supraclavicular LAD.   ?? MSK - No cyanosis. No joint deformity. No clubbing  ?? Neuro - Awake. Follows commands. CN II-XII Grossly Intact. Sensation intact. Strength 5/5 UE and LE. Reflexes 1+ UE and LE bil. Downgoing plantar bil. Normal Gait. Finger-to-nose and rapidly alternating movements intact. Normal pain response  ?? Psych - Oriented to person, place, time. No anxiety or agitation.     LABS:    CBC:   Lab Results   Component Value Date    WBC 6.8 10/26/2019    HGB 13.6 10/26/2019    HCT 42.7 10/26/2019    MCV 87.4 10/26/2019    PLT 171 10/26/2019         No results found for: IRON, TIBC, FERRITIN, FOLATE, VITAMINB12, PTH                                                          BMP:    Lab Results   Component Value Date    NA 139 10/26/2019    K 4.7 10/26/2019    CL 104 10/26/2019    CO2 24 10/26/2019       LFT's:   Lab Results   Component Value Date    ALT 12 10/26/2019    AST 18 10/26/2019    ALKPHOS 71 10/26/2019    BILITOT <0.2 10/26/2019       Lipids:   Lab Results   Component Value Date    CHOL 224 (H) 08/04/2019    HDL 62 (H) 08/04/2019    LDLCALC 137 (H) 08/04/2019    TRIG 124 08/04/2019       INR:   Lab Results   Component Value Date    INR 1.03 10/11/2019    PROTIME 12.0 10/11/2019       U/A:  Lab Results   Component Value Date    LABMICR Not Indicated 10/26/2019          Lab Results   Component Value Date    LABA1C 5.7 08/04/2019        Lab Results   Component Value Date    CREATININE 1.5 (H) 10/26/2019        -----------------------------------------------------------------       Assessment/Plan:   Patient Active Problem List:     Lumbar radiculopathy     Chronic diastolic heart failure (HCC)     Resistant hypertension     GERD (gastroesophageal reflux disease)     Wound of back     Acute kidney injury ([place])      Disposition:     HTN  - continue coreg 12.5 bid, coreg 0.3 daily, valsartan 80 daily    Numbness, tingling  - B12, copper ordered    Sleep disturbance  - SPLIT study 2017 showed prolnged sleep latency but not Insomnia with over 77% sleep efficiency. No physiological sleep disorder identified. No apnea, mild snoring only, no hypoxemia, hypercapnia  - start trazodone 50 nightly      Hyman Bower, D.O.   Internal Medicine Resident,  PGY-1    12/10/2019, 2:18 PM     Addendum to Resident H& P/Progress note:  I have personally seen,examined and evaluated the patient. I have reviewed the current history, physical findings, labs and assessment and plan and agree with note as documented by resident MD ( Dr.Zylie Mumaw)      Britt Bottom, MD, Rosalita Chessman

## 2019-12-10 NOTE — Patient Instructions (Signed)
Patient Education        Learning About Sleeping Well  What does sleeping well mean?     Sleeping well means getting enough sleep. How much sleep is enough varies among people.  The number of hours you sleep is not as important as how you feel when you wake up. If you do not feel refreshed, you probably need more sleep. Another sign of not getting enough sleep is feeling tired during the day.  The average total nightly sleep time is 7?? to 8 hours. Healthy adults may need a little more or a little less than this.  Why is getting enough sleep important?  Getting enough quality sleep is a basic part of good health. When your sleep suffers, your mood and your thoughts can suffer too. You may find yourself feeling more grumpy or stressed. Not getting enough sleep also can lead to serious problems, including injury, accidents, anxiety, and depression.  What might cause poor sleeping?  Many things can cause sleep problems, including:  ?? Stress. Stress can be caused by fear about a single event, such as giving a speech. Or you may have ongoing stress, such as worry about work or school.  ?? Depression, anxiety, and other mental or emotional conditions.  ?? Changes in your sleep habits or surroundings. This includes changes that happen where you sleep, such as noise, light, or sleeping in a different bed. It also includes changes in your sleep pattern, such as having jet lag or working a late shift.  ?? Health problems, such as pain, breathing problems, and restless legs syndrome.  ?? Lack of regular exercise.  How can you help yourself?  Here are some tips that may help you sleep more soundly and wake up feeling more refreshed.  Your sleeping area   ?? Use your bedroom only for sleeping and sex. A bit of light reading may help you fall asleep. But if it doesn't, do your reading elsewhere in the house. Don't watch TV in bed.  ?? Be sure your bed is big enough to stretch out comfortably, especially if you have a sleep  partner.  ?? Keep your bedroom quiet, dark, and cool. Use curtains, blinds, or a sleep mask to block out light. To block out noise, use earplugs, soothing music, or a "white noise" machine.  Your evening and bedtime routine   ?? Create a relaxing bedtime routine. You might want to take a warm shower or bath, listen to soothing music, or drink a cup of noncaffeinated tea.  ?? Go to bed at the same time every night. And get up at the same time every morning, even if you feel tired.  What to avoid   ?? Limit caffeine (coffee, tea, caffeinated sodas) during the day, and don't have any for at least 4 to 6 hours before bedtime.  ?? Don't drink alcohol before bedtime. Alcohol can cause you to wake up more often during the night.  ?? Don't smoke or use tobacco, especially in the evening. Nicotine can keep you awake.  ?? Don't take naps during the day, especially close to bedtime.  ?? Don't lie in bed awake for too long. If you can't fall asleep, or if you wake up in the middle of the night and can't get back to sleep within 15 minutes or so, get out of bed and go to another room until you feel sleepy.  ?? Don't take medicine right before bed that may keep you awake or make   you feel hyper or energized. Your doctor can tell you if your medicine may do this and if you can take it earlier in the day.  If you can't sleep   ?? Imagine yourself in a peaceful, pleasant scene. Focus on the details and feelings of being in a place that is relaxing.  ?? Get up and do a quiet or boring activity until you feel sleepy.  ?? Don't drink any liquids after 6 p.m. if you wake up often because you have to go to the bathroom.  Where can you learn more?  Go to [email or web address] and sign in to your MyChart account. Enter J942 in the Search Health Information box to learn more about "Learning About Sleeping Well."     If you do not have an account, please click on the "Sign Up Now" link.  Current as of: May 12, 2019??????????????????????????????Content Version: 12.8  ?? 2006-2021 [employer].   Care instructions adapted under license by [facility]. If you have questions about a medical condition or this instruction, always ask your healthcare professional. [employer] disclaims any warranty or liability for your use of this information.

## 2019-12-15 ENCOUNTER — Encounter: Attending: Student in an Organized Health Care Education/Training Program | Primary: Internal Medicine

## 2019-12-15 NOTE — Telephone Encounter (Signed)
Patient saw [facility] on 12/08/19. She is having EGD with Dr. Nancy Nordmann. Dr. Gardiner Coins office would like something in writing faxed over stating she is cleared for procedure.     Fax number: [phone]  Phone number: [phone]

## 2019-12-15 NOTE — Telephone Encounter (Signed)
Will consult with [facility].

## 2019-12-16 NOTE — Telephone Encounter (Signed)
Informed pt [facility] signed the CC letter and it will be faxed to to Dr. Gardiner Coins office for EGD procedure. Pt verbalized understanding.

## 2019-12-21 LAB — VITAMIN B12: Vitamin B-12: 656 pg/mL (ref 211–911)

## 2019-12-21 LAB — CORTISOL AM, TOTAL: Cortisol - AM: 11.4 ug/dL (ref 4.3–22.4)

## 2019-12-22 LAB — COPPER, SERUM: Copper: 108.3 ug/dL (ref 80.0–155.0)

## 2019-12-22 LAB — ALDOSTERONE: Aldosterone: 22.5 ng/dL

## 2019-12-24 LAB — RENIN: Renin Activity: 1.5 ng/mL/hr

## 2019-12-28 ENCOUNTER — Telehealth

## 2019-12-28 NOTE — Telephone Encounter (Signed)
Gabrielle Wells with [facility],  Patients blood pressure 12-28-19   178/112   home reading systolic  [id number] to  [id number]   And bottom diastolic 123XX123 and 123456   On increased dose of corig 12.5   2 x daily  Also wanting to confirm we have received lab results from home drawn.  Please call Nira Conn w/ [facility]  at [phone]

## 2019-12-28 NOTE — Telephone Encounter (Signed)
Spoke with [facility] she explained pt has had issues with chronic  Hypertension pt HR is running In the 90's pt takes coreg 12.5 mg bid, clonidine 0.3 mg daily, and valsartan 80 mg daily. Pt denies sx's. Please advise.

## 2019-12-29 LAB — CATECHOLAMINES FREE URINE
Creatinine, Ur: 378 mg/dL
Dopamine, (ug/L): 1161 ug/L
Dopamine: 307 ug/g CRT — ABNORMAL HIGH (ref 0–250)
Epinephrine, (uG/L): 12 ug/L
Epinephrine: 3 ug/g CRT (ref 0–20)
Norepinephrine, (ug/L): 273 ug/L
Norepinephrine: 72 ug/g CRT — ABNORMAL HIGH (ref 0–45)

## 2019-12-29 MED ORDER — VALSARTAN 80 MG PO TABS
80 MG | ORAL_TABLET | Freq: Every day | ORAL | 1 refills | Status: DC
Start: 2019-12-29 — End: 2019-12-29

## 2019-12-29 MED ORDER — VALSARTAN 80 MG PO TABS
80 MG | ORAL_TABLET | Freq: Every day | ORAL | 3 refills | Status: DC
Start: 2019-12-29 — End: 2020-01-12

## 2019-12-29 NOTE — Telephone Encounter (Signed)
Informed Heather [facility] Georgiann Cocker NP would like the pt to increase Valsartan to 160 mg daily and check a BMP in 2 weeks. Heather verbalized understanding and will notify pt of the changes.

## 2019-12-29 NOTE — Telephone Encounter (Signed)
Increase valsartan to 160mg /day and check BMP 2 weeks after increase. KJV

## 2020-01-04 NOTE — Telephone Encounter (Addendum)
Gabrielle Wells, [facility] called back with update. BP 164/112 today. Weight has gone up from 279-285.     Lainie has been taking 80 mg Lasix daily and valsartan 160 mg daily.     Please call Gabrielle Wells at [phone]. Thanks.

## 2020-01-04 NOTE — Telephone Encounter (Signed)
Informed Heather [facility] reviewed pt's chart and [facility] would like the pt to increase lasix to 80 mg bid and continue all other medications unchanged. Heather verbalized understanding and will let the pt know.

## 2020-01-04 NOTE — Telephone Encounter (Signed)
Gabrielle Wells reports pts lungs are clear, no sx's. HR is 72. Pt started taking lasix 80 mg daily for a week now.

## 2020-01-05 ENCOUNTER — Encounter: Attending: Cardiovascular Disease | Primary: Internal Medicine

## 2020-01-05 MED ORDER — CLONIDINE HCL 0.3 MG PO TABS
0.3 MG | ORAL_TABLET | Freq: Every day | ORAL | 2 refills | Status: DC
Start: 2020-01-05 — End: 2020-01-07

## 2020-01-05 NOTE — Telephone Encounter (Signed)
Last ov - 12/10/2019 - Dr. Eula Fried  Next ov - 02/17/2020 -       Clonidine was last filled  12/01/2019 - 0 rf

## 2020-01-05 NOTE — Telephone Encounter (Signed)
Name of Caller: Kathlee Nations         Relationship to patient:third party         Organization name: [employer]         Best contact number: [phone]         Reason for call: Kathlee Nations from [facility] is calling to follow up for a request for RX script for incontinence medical supplies for the patient. She also wants to confirm did the incontinence order get received via fax on 12-16-2019

## 2020-01-07 ENCOUNTER — Ambulatory Visit
Admit: 2020-01-07 | Discharge: 2020-01-07 | Payer: MEDICARE | Attending: Cardiovascular Disease | Primary: Internal Medicine

## 2020-01-07 DIAGNOSIS — I5032 Chronic diastolic (congestive) heart failure: Secondary | ICD-10-CM

## 2020-01-07 MED ORDER — TORSEMIDE 100 MG PO TABS
100 MG | ORAL_TABLET | Freq: Every day | ORAL | 3 refills | Status: DC
Start: 2020-01-07 — End: 2020-01-19

## 2020-01-07 MED ORDER — CARVEDILOL 25 MG PO TABS
25 MG | ORAL_TABLET | Freq: Two times a day (BID) | ORAL | 3 refills | Status: DC
Start: 2020-01-07 — End: 2021-02-05

## 2020-01-07 MED ORDER — HYDRALAZINE HCL 50 MG PO TABS
50 MG | ORAL_TABLET | Freq: Three times a day (TID) | ORAL | 3 refills | Status: DC
Start: 2020-01-07 — End: 2020-01-19

## 2020-01-07 NOTE — Progress Notes (Signed)
Cc: Resistant HTN, HFpEF    HPI:     Gabrielle Wells??is a 69 y.o.??female??with a past medical history of morbid obesity, HTN, HFpEF, CKD, GERD.??  ??  Patient was admitted at [facility] 2/22 - 10/15/19 for dehydration, AKI, sinus bradycardia (due to BB toxicity in the setting of AKI).   ??  Echo 09/2019: nl LV, EF [id number], normal diastolic, normal valves and RV.   ??  ECG 10/11/19: sinus brady 55, nonspecific changes, low voltage.   ??  Renal duplex 09/2019: no RAS, indeterminate hypoechoic lesion R kidney.    Urine metanephrines w/ high norepi, normal cortisol, renin and aldosterone.   ??  Patient is here for a follow up. She reports labile HTN (BP 80/60 - 210/110s). She also noticed some leg swelling, hence increased her lasix to 80 qam; it works for a few hours but then by the next day, she is swollen again. She reports taking coreg 12.5 mg two tabs four times daily!    Histories     Past Medical History:   has a past medical history of CHF (congestive heart failure) ([place]), Epidural abscess, GERD (gastroesophageal reflux disease), HTN (hypertension), IBS (irritable bowel syndrome), Lumbar radiculopathy, Morbid obesity ([place]), Muscle fasciculation, Neuropathic pain, SBO (small bowel obstruction) ([place]), Spinal fracture of T8 vertebra ([place]), Surgical site infection, and Urinary incontinence.    Surgical History:   has a past surgical history that includes Appendectomy; Breast lumpectomy (Bilateral); Hysterectomy, total abdominal; Cholecystectomy; Total knee arthroplasty (Right); and Cardiac catheterization (2012).     Social History:   reports that she has never smoked. She has never used smokeless tobacco. She reports that she does not drink alcohol and does not use drugs.     Family History:  No evidence for sudden cardiac death or premature CAD      Medications:     Home medications were reviewed and are listed below    Prior to Admission medications    Medication Sig Start Date End Date Taking? Authorizing Provider   torsemide  (DEMADEX) 100 MG tablet Take 1 tablet by mouth daily 01/07/20  Yes Hervey Ard, MD   carvedilol (COREG) 25 MG tablet Take 1 tablet by mouth 2 times daily 01/07/20  Yes Hervey Ard, MD   hydrALAZINE (APRESOLINE) 50 MG tablet Take 0.5 tablets by mouth 3 times daily 01/07/20  Yes Hervey Ard, MD   valsartan (DIOVAN) 80 MG tablet TAKE 2 TABLETS BY MOUTH DAILY 12/29/19  Yes Everlean Alstrom, APRN - CNP   traZODone (DESYREL) 50 MG tablet Take 1 tablet by mouth nightly 12/10/19  Yes Hyman Bower, DO   gabapentin (NEURONTIN) 300 MG capsule Take 1 capsule by mouth 3 times daily for 90 days. 12/10/19 03/09/20 Yes Hyman Bower, DO   citalopram (CELEXA) 40 MG tablet Take 1 tablet by mouth daily 12/10/19  Yes Hyman Bower, DO   vitamin D3 (CHOLECALCIFEROL) 25 MCG (1000 UT) TABS tablet Take 2 tablets by mouth daily 11/10/19  Yes Bebe Liter, MD   ondansetron (ZOFRAN ODT) 4 MG disintegrating tablet Take 1 tablet by mouth every 8 hours as needed for Nausea 10/26/19  Yes Beatris Si, PA-C   loperamide (IMODIUM) 2 MG capsule Take 2 mg by mouth 4 times daily as needed for Diarrhea   Yes Historical Provider, MD   omeprazole (PRILOSEC) 20 MG delayed release capsule Take 1 capsule by mouth every morning (before breakfast) 09/08/19  Yes Delmar Landau, MD  Allergy:     Betadine [povidone iodine], Coconut oil, Codeine, Fish-derived products, Ibuprofen, Iodine, Other, and Sulfa antibiotics       Review of Systems:     All 12 point review of symptoms completed. Pertinent positives identified in the HPI, all other review of symptoms negative as below.    CONSTITUTIONAL: No fatigue  SKIN: No rash or pruritis.  EYES: No visual changes or diplopia. No scleral icterus.  ENT: No Headaches, hearing loss or vertigo. No mouth sores or sore throat.  CARDIOVASCULAR: No chest pain/chest pressure/chest discomfort. No palpitations. No edema.   RESPIRATORY: No dyspnea. No cough or wheezing, no sputum production.  GASTROINTESTINAL: No  N/V/D. No abdominal pain, appetite loss, blood in stools.  GENITOURINARY: No dysuria, trouble voiding, or hematuria.  MUSCULOSKELETAL:  No gait disturbance, weakness or joint complaints.  NEUROLOGICAL: No headache, diplopia, change in muscle strength, numbness or tingling. No change in gait, balance, coordination, mood, affect, memory, mentation, behavior.  PSHYCH: No anxiety, loss of interest, change in sexual behavior, feelings of self-harm, or confusion.  ENDOCRINE: No excessive thirst, fluid intake, or urination. No tremor.  HEMATOLOGIC: No abnormal bruising or bleeding.  ALLERGY: No nasal congestion or hives.      Physical Examination:     Vitals:    01/07/20 1534   BP: 138/88   Pulse: 79   Weight: 287 lb (130.2 kg)       Wt Readings from Last 3 Encounters:   01/07/20 287 lb (130.2 kg)   12/10/19 284 lb 9.6 oz (129.1 kg)   12/08/19 286 lb 9.6 oz (130 kg)         General Appearance:  Alert, cooperative, no distress, appears stated age Appropriate weight   Head:  Normocephalic, without obvious abnormality, atraumatic   Eyes:  PERRL, conjunctiva/corneas clear EOM intact  Ears normal   Throat no lesions       Nose: Nares normal, no drainage or sinus tenderness   Throat: Lips, mucosa, and tongue normal   Neck: Supple, symmetrical, trachea midline, no adenopathy, thyroid: not enlarged, symmetric, no tenderness/mass/nodules, no carotid bruit       Lungs:   Clear to auscultation bilaterally, respirations unlabored   Chest Wall:  No tenderness or deformity   Heart:  Regular rhythm, rate is controlled, S1, S2 normal, there is no murmur, there is no rub or gallop, no jvd, no bilateral lower extremity edema   Abdomen:   Soft, non-tender, bowel sounds active all four quadrants,  no masses, no organomegaly       Extremities: Extremities normal, atraumatic, no cyanosis   Pulses: 2+ and symmetric   Skin: Skin color, texture, turgor normal, no rashes or lesions   Pysch: Normal mood and affect   Neurologic: Normal gross motor  and sensory exam.  Cranial nerves intact        Labs:     Lab Results   Component Value Date    WBC 6.8 10/26/2019    HGB 13.6 10/26/2019    HCT 42.7 10/26/2019    MCV 87.4 10/26/2019    PLT 171 10/26/2019     Lab Results   Component Value Date    NA 139 10/26/2019    K 4.7 10/26/2019    CL 104 10/26/2019    CO2 24 10/26/2019    BUN 20 10/26/2019    CREATININE 1.5 (H) 10/26/2019    GLUCOSE 94 10/26/2019    CALCIUM 9.7 10/26/2019    PROT 7.7 10/26/2019  LABALBU 4.1 10/26/2019    BILITOT <0.2 10/26/2019    ALKPHOS 71 10/26/2019    AST 18 10/26/2019    ALT 12 10/26/2019    LABGLOM 34 (A) 10/26/2019    GFRAA 41 (A) 10/26/2019    AGRATIO 1.1 10/26/2019    GLOB 3.6 10/26/2019         Lab Results   Component Value Date    CHOL 224 (H) 08/04/2019     Lab Results   Component Value Date    TRIG 124 08/04/2019     Lab Results   Component Value Date    HDL 62 (H) 08/04/2019     Lab Results   Component Value Date    LDLCALC 137 (H) 08/04/2019     Lab Results   Component Value Date    LABVLDL 25 08/04/2019     No results found for: [facility]    Lab Results   Component Value Date    INR 1.03 10/11/2019    PROTIME 12.0 10/11/2019       The 10-year ASCVD risk score Mikey Bussing DC Jr., et al., 2013) is: 15.3%    Values used to calculate the score:      Age: 28 years      Sex: Female      Is Non-Hispanic African American: Yes      Diabetic: No      Tobacco smoker: No      Systolic Blood Pressure: 0000000 mmHg      Is BP treated: Yes      HDL Cholesterol: 62 mg/dL      Total Cholesterol: 224 mg/dL      Assessment / Plan:      Diagnosis Orders   1. Chronic diastolic heart failure ([place])  BASIC METABOLIC PANEL    BASIC METABOLIC PANEL   2. Long term use of drug  BASIC METABOLIC PANEL    BASIC METABOLIC PANEL   3. Resistant hypertension          1. Resistant HTN:   Patient reports compliance with meds and diet. It could be secondary HTN (elevated norepinephrine per urine test).   ??  -Will defer to PCP vs endocrinologist further investigation  regarding abnormal norepi levels.   -Cw coreg 25 bid, valsartan 160 daily  -Will stop clonidine 0.3 daily (due to BP lability), will start hydralazine 50 mg 1/2 tab q8 hours; patient will need to check her BP around afternoon, if elevated, may increase hydralazine to whole tab q8.   -Low salt diet  ??  2. Chronic HFpEF:   Patient appears euvolemic.   ??  -BP control as above.   -Low salt diet  -Change lasix to torsemide 100 daily  -BMP in 1 and 2 weeks  ??  4. Right renal lesion:   It is likely a cyst, however, I cannot exclude malignancy (indeterminate per [name]); CT abdomen suggests cyst.   ??        Return in about 4 weeks (around 02/04/2020).      I have spent 40 minutes of face to face time with the patient with more than 50% spent counseling and coordinating care.       I have personally reviewed the reports and images of labs, radiological studies, cardiac studies including ECG's and telemetry, current and old medical records. The note was completed using EMR and Dragon dictation system. Every effort was made to ensure accuracy; however, inadvertent computerized transcription errors may be present.  All questions and concerns were addressed to the patient/family. Alternatives to my treatment were discussed.     I would like to thank you for providing me the opportunity to participate in the care of your patient. If you have any questions, please do not hesitate to contact me.     Hervey Ard, MD, [facility], [place]  [address]  [place] [address]  Main Office Phone: [phone]  Heart Failure Hotline: [phone]  Fax: [phone]

## 2020-01-10 NOTE — Telephone Encounter (Signed)
Refill Valsartan  Last OV 12-10-19 Qureshi  Next appt. 02-17-20

## 2020-01-11 NOTE — Telephone Encounter (Signed)
Heather with [facility] called to let Jenny Reichmann B know that patient just started new medications today so she will draw BMP on 01/18/20. She can be reached at [phone] if questions.

## 2020-01-12 MED ORDER — VALSARTAN 80 MG PO TABS
80 MG | ORAL_TABLET | Freq: Every day | ORAL | 1 refills | Status: DC
Start: 2020-01-12 — End: 2020-01-27

## 2020-01-12 NOTE — Telephone Encounter (Signed)
Refill Valsartan  Last OV  12-10-19 Qureshi  Next appt. 02-17-20

## 2020-01-12 NOTE — Telephone Encounter (Signed)
Refilled.

## 2020-01-12 NOTE — Telephone Encounter (Addendum)
Spoke with [facility] ok to get BMP on 01/18/20. Heather verbalized understanding.

## 2020-01-13 MED ORDER — VALSARTAN 80 MG PO TABS
80 MG | ORAL_TABLET | Freq: Every day | ORAL | 0 refills | Status: DC
Start: 2020-01-13 — End: 2020-01-28

## 2020-01-18 LAB — BASIC METABOLIC PANEL
Anion Gap: 15 (ref 3–16)
BUN: 29 mg/dL — ABNORMAL HIGH (ref 7–20)
CO2: 27 mmol/L (ref 21–32)
Calcium: 9 mg/dL (ref 8.3–10.6)
Chloride: 100 mmol/L (ref 99–110)
Creatinine: 1.8 mg/dL — ABNORMAL HIGH (ref 0.6–1.2)
GFR African American: 34 — AB (ref >60)
GFR Non-African American: 28 — AB (ref >60)
Glucose: 132 mg/dL — ABNORMAL HIGH (ref 70–99)
Potassium: 4.3 mmol/L (ref 3.5–5.1)
Sodium: 142 mmol/L (ref 136–145)

## 2020-01-18 NOTE — Telephone Encounter (Addendum)
[place], heather    On Sunday patient didn't urinate all day today normal  . [place] ran a bmp today and request it stat. Today blood pressure  104/84, HR 69  Heather # [phone]

## 2020-01-18 NOTE — Telephone Encounter (Signed)
Spoke with Nira Conn pt is asymptomatic. Will notify [facility] about urine output and vitals.

## 2020-01-19 ENCOUNTER — Encounter

## 2020-01-19 MED ORDER — HYDRALAZINE HCL 50 MG PO TABS
50 MG | ORAL_TABLET | Freq: Three times a day (TID) | ORAL | 3 refills | Status: DC
Start: 2020-01-19 — End: 2020-02-01

## 2020-01-19 MED ORDER — TORSEMIDE 100 MG PO TABS
100 MG | ORAL_TABLET | Freq: Every day | ORAL | 3 refills | Status: DC
Start: 2020-01-19 — End: 2020-02-01

## 2020-01-19 NOTE — Telephone Encounter (Addendum)
Spoke to pt BP are fluctuating   181/117   138/79  144/83  184/96  138/104  All BP's were taken between 1-2pm HR has been running 80-90's.  [facility] reviewed and would like the pt to increase Hydralazine 50 mg 3X a daily. Advised pt to continue to keep a record of BP and HR. Pt verbalized understanding.

## 2020-01-26 LAB — BASIC METABOLIC PANEL
Anion Gap: 24 — ABNORMAL HIGH (ref 3–16)
BUN: 25 mg/dL — ABNORMAL HIGH (ref 7–20)
CO2: 20 mmol/L — ABNORMAL LOW (ref 21–32)
Calcium: 9.5 mg/dL (ref 8.3–10.6)
Chloride: 99 mmol/L (ref 99–110)
Creatinine: 2.8 mg/dL — ABNORMAL HIGH (ref 0.6–1.2)
GFR African American: 20 — AB (ref >60)
GFR Non-African American: 17 — AB (ref >60)
Glucose: 145 mg/dL — ABNORMAL HIGH (ref 70–99)
Potassium: 3.7 mmol/L (ref 3.5–5.1)
Sodium: 143 mmol/L (ref 136–145)

## 2020-01-27 ENCOUNTER — Telehealth

## 2020-01-27 NOTE — Telephone Encounter (Signed)
Pt called c/o dizziness, nausea, and low BP 71/50 HR 78.     Informed pt and [facility] Nathanial Millman NP reviewed pt's chart and last BMP 01/25/20 and would like the pt to stop the Diovan and decrease the torsemide to 50 mg daily. Advised pt to go to the ER if symptoms persist, Heather [facility] will be coming out again tomorrow to recheck a manual BP and HR. Pt and Heather verbalized understanding.

## 2020-01-28 ENCOUNTER — Ambulatory Visit
Admit: 2020-01-28 | Discharge: 2020-01-28 | Payer: MEDICARE | Attending: Student in an Organized Health Care Education/Training Program | Primary: Internal Medicine

## 2020-01-28 DIAGNOSIS — I129 Hypertensive chronic kidney disease with stage 1 through stage 4 chronic kidney disease, or unspecified chronic kidney disease: Secondary | ICD-10-CM

## 2020-01-28 NOTE — Progress Notes (Signed)
Outpatient Clinic Established Patient Note    Patient: Gabrielle Wells  DOB: 11-05-1950 (69 y.o.)  Date: 01/28/2020    CC: labile BPs    HPI:      69 yo F with PMHx CKD4, HTN, morbid obesity, and GERD who p/w complaints of fluctuating BP and most-recently hypotension.     HTN - her blood pressure was low all day on 01/18/20. Her [place] nurse was measuring and recording Bps (available in chart). She went from SBP max of 184 to a low of 71/50. She had not urinated that entire day. BMP performed on 6/8 identified a CR bump from 1.8 to 2.8. She was advised to discontinue valsartan, half her torsemide from 100 mg to 50 mg daily and decrease hydralazine from 50 mg PO TID to BID.  She states that she has dealt with hypertension in the past for some time and did not receive rewas been high in the past for some years. She reported a high of 245/119 last week.   She denies CP, palpitations or diaphoresis.     CKD4 - states that her swelling is stable on the torsemide 50 mg PO qD. She notes her urine is "soapy." No dysuria. CT abdo identified right renal cyst. Renal ultrasound in 10/12/2019 identified 3 cm right upper kidney cyst.  From 06/1 to 06/8 she developed AKI. She states she hadn't peed all day and her BP were extremely soft. [place] nurse notes fluctuating blood pressures. She is on hydralazine 50 mg PO BID which was decreased from TID. And she was told to discontinue her losartan.     Neck pain - she has chronic neck pain. She fell last September in the house and she fell on her stomach. She states that her back and neck have not been the same since. No numbness or tingling. She does state that the pain radiates down the back in the C5-C6 distribution.     Discussed with sister on the phone with patient's permission, who is invested in her care.     Home Meds:  Prior to Visit Medications    Medication Sig Taking? Authorizing Provider   hydrALAZINE (APRESOLINE) 50 MG tablet Take 1 tablet by mouth 3 times daily  Patient  taking differently: Take 50 mg by mouth 2 times daily  Yes Hervey Ard, MD   torsemide (DEMADEX) 100 MG tablet Take 0.5 tablets by mouth daily Yes Hervey Ard, MD   carvedilol (COREG) 25 MG tablet Take 1 tablet by mouth 2 times daily Yes Hervey Ard, MD   traZODone (DESYREL) 50 MG tablet Take 1 tablet by mouth nightly Yes Hyman Bower, DO   gabapentin (NEURONTIN) 300 MG capsule Take 1 capsule by mouth 3 times daily for 90 days. Yes Hyman Bower, DO   citalopram (CELEXA) 40 MG tablet Take 1 tablet by mouth daily Yes Hyman Bower, DO   vitamin D3 (CHOLECALCIFEROL) 25 MCG (1000 UT) TABS tablet Take 2 tablets by mouth daily Yes Bebe Liter, MD   ondansetron (ZOFRAN ODT) 4 MG disintegrating tablet Take 1 tablet by mouth every 8 hours as needed for Nausea Yes Beatris Si, PA-C   loperamide (IMODIUM) 2 MG capsule Take 2 mg by mouth 4 times daily as needed for Diarrhea Yes Historical Provider, MD   omeprazole (PRILOSEC) 20 MG delayed release capsule Take 1 capsule by mouth every morning (before breakfast) Yes Delmar Landau, MD       Allergies:  Betadine [povidone iodine], Coconut oil, Codeine, Fish-derived products, Ibuprofen, Iodine, Other, and Sulfa antibiotics    Health Maintenance Due   Topic Date Due    Shingles Vaccine (1 of 2) Never done    Colon cancer screen colonoscopy  Never done    Annual Wellness Visit (AWV)  Never done       Immunization History   Administered Date(s) Administered    Influenza, Quadv, IM, PF (6 mo and older Fluzone, Flulaval, Fluarix, and 3 yrs and older Afluria) 10/15/2019    Pneumococcal Polysaccharide (Pneumovax23) 09/08/2019    Tdap (Boostrix, Adacel) 09/08/2019       Review of Systems  A 10-organ Review Of Systems was obtained and otherwise unremarkable except as per HPI.    Data: Old records have been reviewed electronically.    PHYSICAL EXAM:  BP 119/72 (Site: Left Lower Arm, Position: Sitting, Cuff Size: Medium Adult)    Pulse 72    Temp 98.1 ??F (36.7 ??C)  (Temporal)    Ht 5\' 3"  (1.6 m)    Wt 285 lb 8 oz (129.5 kg)    SpO2 (!) 75%    BMI 50.57 kg/m??      Physical Exam     General appearance: Obese, Appears comfortable at rest, fully alert and orientated    HEENT: Atraumatic, normocephalic, moist mucus membranes. Buffalo hump. TTP of cervical spine at C5-C6 spinous process with radiation down those distributions. Unable to raise upper extremities past shoulder level due to pain.   Respiratory: Normal respiratory effort. No wheezes, rubs, or crackles  Cardiovascular: regular S1/S2, with no Murmur, rub or gallop. No JVD  Abdomen: Soft, non-tender, non-distended, obese  Musculoskeletal: No clubbing, cyanosis, 1+ lower extremity edema, peripheral pulses present, cap refill < 2sec  Neurologic: Neurovascularly grossly intact without any focal motor deficits. Cranial nerves:  grossly non-focal.    Assessment & Plan:    69 yo F with PMHx HTN    1. AKI (acute kidney injury) ([place])  - CR bump from 1.8 to 2.8, has since halved diuretic, stopped ARB and decreased hydralazine  - BP better at 119/72 today in clinic  - patient symptomatically better since changes have been made in medications on 6/10    2. CKD stage 4 secondary to hypertension ([facility])  - CR baseline appears to be 1.8  - Follow-up with Dr. Felix Pacini on 6/17 to establish care  - Avoid NSAIDs or nephrotoxins.    3. Cervical pain (neck)  - XR CERVICAL SPINE FLEXION AND EXTENSION; Future  - provided with neck exercises  - advised to avoid NSAIDs and use tylenol as needed    4. HTN  -For your blood pressure continue with hydralazine 50 mg PO twice daily and torsemide 50 mg daily.  Follow-up with Dr. Keenan Bachelor on 6/15.     Return in about 3 months (around 04/29/2020).    Dispo: Pt has been staffed with Dr. Truddie Coco   _______________  Mauricia Area, MD, 03/29/9146 4:57 PM   PGY-3    Addendum to Resident H& P/Progress note:  I have personally seen,examined and evaluated the patient. I have reviewed the current history, physical findings,  labs and assessment and plan and agree with note as documented by resident MD ( Dr.Ezell Poke)      Britt Bottom, MD, [place]

## 2020-01-28 NOTE — Patient Instructions (Addendum)
Follow-up with Dr. Keenan Bachelor on 6/15.   Follow-up with Dr. Felix Pacini on 6/17.  Avoid NSAIDs or nephrotoxins.  For your blood pressure continue with hydralazine 50 mg PO twice daily and torsemide 50 mg daily.  Continue to hold losartan.     Follow-up neck X-ray.     Patient Education        Neck Arthritis: Exercises  Introduction  Here are some examples of exercises for you to try. The exercises may be suggested for a condition or for rehabilitation. Start each exercise slowly. Ease off the exercises if you start to have pain.  You will be told when to start these exercises and which ones will work best for you.  How to do the exercises  Neck stretches to the side   1. This stretch works best if you keep your shoulder down as you lean away from it. To help you remember to do this, start by relaxing your shoulders and lightly holding on to your thighs or your chair.  2. Tilt your head toward your shoulder and hold for 15 to 30 seconds. Let the weight of your head stretch your muscles.  3. Repeat 2 to 4 times toward each shoulder.    Chin tuck   1. Lie on the floor with a rolled-up towel under your neck. Your head should be touching the floor.  2. Slowly bring your chin toward your chest.  3. Hold for a count of 6, and then relax for up to 10 seconds.  4. Repeat 8 to 12 times.    Active cervical rotation   1. Sit in a firm chair, or stand up straight.  2. Keeping your chin level, turn your head to the right, and hold for 15 to 30 seconds.  3. Turn your head to the left and hold for 15 to 30 seconds.  4. Repeat 2 to 4 times to each side.    Shoulder blade squeeze   1. While standing, squeeze your shoulder blades together.  2. Do not raise your shoulders up as you are squeezing.  3. Hold for 6 seconds.  4. Repeat 8 to 12 times.    Shoulder rolls   1. Sit comfortably with your feet shoulder-width apart. You can also do this exercise standing up.  2. Roll your shoulders up, then back, and then down in a smooth, circular  motion.  3. Repeat 2 to 4 times.    Follow-up care is a key part of your treatment and safety. Be sure to make and go to all appointments, and call your doctor if you are having problems. It's also a good idea to know your test results and keep a list of the medicines you take.  Where can you learn more?  Go to [email or web address] and sign in to your MyChart account. Enter [id number] in the [place] box to learn more about "Neck Arthritis: Exercises."     If you do not have an account, please click on the "Sign Up Now" link.  Current as of: July 05, 2019??????????????????????????????Content Version: 12.8  ?? 2006-2021 [employer].   Care instructions adapted under license by [facility]. If you have questions about a medical condition or this instruction, always ask your healthcare professional. Holden any warranty or liability for your use of this information.

## 2020-01-28 NOTE — Telephone Encounter (Signed)
Heather from [facility] calling  To report patient Blood pressure 134/86  Also patient would like to speak with nurse regarding  Pt medication  Nira Conn can be reached at  [phone] please advise

## 2020-01-28 NOTE — Telephone Encounter (Signed)
Informed Heather the BP looks better pt to continue torsemide 50 mg daily and continue to stay off the diovan. Heather verbalized understanding and will be going out for a home visit next week.

## 2020-02-01 ENCOUNTER — Ambulatory Visit
Admit: 2020-02-01 | Discharge: 2020-02-01 | Payer: MEDICARE | Attending: Cardiovascular Disease | Primary: Internal Medicine

## 2020-02-01 ENCOUNTER — Encounter

## 2020-02-01 DIAGNOSIS — I5032 Chronic diastolic (congestive) heart failure: Secondary | ICD-10-CM

## 2020-02-01 MED ORDER — HYDRALAZINE HCL 100 MG PO TABS
100 MG | ORAL_TABLET | Freq: Two times a day (BID) | ORAL | 3 refills | Status: DC
Start: 2020-02-01 — End: 2020-02-03

## 2020-02-01 NOTE — Progress Notes (Signed)
Cc: Resistant HTN, HFpEF    HPI:     Gabrielle Wells??is a 69 y.o.??female??with a past medical history of morbid obesity, resistant HTN, HFpEF, CKD, GERD.??  ??  Patient was admitted at [facility] 2/22 - 10/15/19 for dehydration, AKI, sinus bradycardia (due to BB toxicity in the setting of AKI).??  ??  Echo??09/2019: nl LV, EF 34-74%, normal diastolic, normal valves and RV.   ??  ECG 10/11/19: sinus brady 55, nonspecific changes, low voltage.   ??  Renal duplex 09/2019: no RAS, indeterminate hypoechoic lesion R kidney.  ??  Urine metanephrines w/ high norepi, normal cortisol, renin and aldosterone.   ??  Patient is here for a follow up.   She reports SBP at home 140-200s.  She has decreased the amount of salt in her diet.  Creatinine has increased to 2.8 from 1.8.  She is scheduled to see Dr. Silvestre Moment with nephrology within the next few days.      Histories     Past Medical History:   has a past medical history of CHF (congestive heart failure) ([place]), Epidural abscess, GERD (gastroesophageal reflux disease), HTN (hypertension), IBS (irritable bowel syndrome), Lumbar radiculopathy, Morbid obesity ([place]), Muscle fasciculation, Neuropathic pain, SBO (small bowel obstruction) ([place]), Spinal fracture of T8 vertebra ([place]), Surgical site infection, and Urinary incontinence.    Surgical History:   has a past surgical history that includes Appendectomy; Breast lumpectomy (Bilateral); Hysterectomy, total abdominal; Cholecystectomy; Total knee arthroplasty (Bilateral, 2009); Cardiac catheterization (2012); Total shoulder arthroplasty (Right, 2010); and Abdominal hernia repair (2010).     Social History:   reports that she has never smoked. She has never used smokeless tobacco. She reports that she does not drink alcohol and does not use drugs.     Family History:  No evidence for sudden cardiac death or premature CAD      Medications:     Home medications were reviewed and are listed below    Prior to Admission medications    Medication Sig Start  Date End Date Taking? Authorizing Provider   carvedilol (COREG) 25 MG tablet Take 1 tablet by mouth 2 times daily 01/07/20  Yes Hervey Ard, MD   traZODone (DESYREL) 50 MG tablet Take 1 tablet by mouth nightly 12/10/19  Yes Hyman Bower, DO   gabapentin (NEURONTIN) 300 MG capsule Take 1 capsule by mouth 3 times daily for 90 days. 12/10/19 03/09/20 Yes Hyman Bower, DO   citalopram (CELEXA) 40 MG tablet Take 1 tablet by mouth daily 12/10/19  Yes Hyman Bower, DO   vitamin D3 (CHOLECALCIFEROL) 25 MCG (1000 UT) TABS tablet Take 2 tablets by mouth daily 11/10/19  Yes Bebe Liter, MD   ondansetron (ZOFRAN ODT) 4 MG disintegrating tablet Take 1 tablet by mouth every 8 hours as needed for Nausea 10/26/19  Yes Beatris Si, PA-C   loperamide (IMODIUM) 2 MG capsule Take 2 mg by mouth 4 times daily as needed for Diarrhea   Yes Historical Provider, MD   omeprazole (PRILOSEC) 20 MG delayed release capsule Take 1 capsule by mouth every morning (before breakfast) 09/08/19  Yes Delmar Landau, MD   hydrALAZINE (APRESOLINE) 100 MG tablet Take 1 tablet by mouth 2 times daily 02/01/20   Hervey Ard, MD          Allergy:     Betadine [povidone iodine], Coconut oil, Codeine, Fish-derived products, Ibuprofen, Iodine, Other, and Sulfa antibiotics       Review of Systems:  All 12 point review of symptoms completed. Pertinent positives identified in the HPI, all other review of symptoms negative as below.    CONSTITUTIONAL: No fatigue  SKIN: No rash or pruritis.  EYES: No visual changes or diplopia. No scleral icterus.  ENT: No Headaches, hearing loss or vertigo. No mouth sores or sore throat.  CARDIOVASCULAR: No chest pain/chest pressure/chest discomfort. No palpitations. No edema.   RESPIRATORY: No dyspnea. No cough or wheezing, no sputum production.  GASTROINTESTINAL: No N/V/D. No abdominal pain, appetite loss, blood in stools.  GENITOURINARY: No dysuria, trouble voiding, or hematuria.  MUSCULOSKELETAL:  No gait  disturbance, weakness or joint complaints.  NEUROLOGICAL: No headache, diplopia, change in muscle strength, numbness or tingling. No change in gait, balance, coordination, mood, affect, memory, mentation, behavior.  PSHYCH: No anxiety, loss of interest, change in sexual behavior, feelings of self-harm, or confusion.  ENDOCRINE: No excessive thirst, fluid intake, or urination. No tremor.  HEMATOLOGIC: No abnormal bruising or bleeding.  ALLERGY: No nasal congestion or hives.      Physical Examination:     Vitals:    02/01/20 1620 02/01/20 1622   BP: (!) 150/96 (!) 148/86   Pulse:  76   Weight: 280 lb 3.2 oz (127.1 kg)        Wt Readings from Last 3 Encounters:   02/01/20 280 lb 3.2 oz (127.1 kg)   01/28/20 285 lb 8 oz (129.5 kg)   01/07/20 287 lb (130.2 kg)         General Appearance:  Alert, cooperative, no distress, appears stated age Appropriate weight   Head:  Normocephalic, without obvious abnormality, atraumatic   Eyes:  PERRL, conjunctiva/corneas clear EOM intact  Ears normal   Throat no lesions       Nose: Nares normal, no drainage or sinus tenderness   Throat: Lips, mucosa, and tongue normal   Neck: Supple, symmetrical, trachea midline, no adenopathy, thyroid: not enlarged, symmetric, no tenderness/mass/nodules, no carotid bruit       Lungs:   Clear to auscultation bilaterally, respirations unlabored   Chest Wall:  No tenderness or deformity   Heart:  Regular rhythm, rate is controlled, S1, S2 normal, there is no murmur, there is no rub or gallop, no jvd, no bilateral lower extremity edema   Abdomen:   Soft, non-tender, bowel sounds active all four quadrants,  no masses, no organomegaly       Extremities: Extremities normal, atraumatic, no cyanosis   Pulses: 2+ and symmetric   Skin: Skin color, texture, turgor normal, no rashes or lesions   Pysch: Normal mood and affect   Neurologic: Normal gross motor and sensory exam.  Cranial nerves intact        Labs:     Lab Results   Component Value Date    WBC 6.8  10/26/2019    HGB 13.6 10/26/2019    HCT 42.7 10/26/2019    MCV 87.4 10/26/2019    PLT 171 10/26/2019     Lab Results   Component Value Date    NA 143 01/25/2020    K 3.7 01/25/2020    CL 99 01/25/2020    CO2 20 (L) 01/25/2020    BUN 25 (H) 01/25/2020    CREATININE 2.8 (H) 01/25/2020    GLUCOSE 145 (H) 01/25/2020    CALCIUM 9.5 01/25/2020    PROT 7.7 10/26/2019    LABALBU 4.1 10/26/2019    BILITOT <0.2 10/26/2019    ALKPHOS 71 10/26/2019  AST 18 10/26/2019    ALT 12 10/26/2019    LABGLOM 17 (A) 01/25/2020    GFRAA 20 (A) 01/25/2020    AGRATIO 1.1 10/26/2019    GLOB 3.6 10/26/2019         Lab Results   Component Value Date    CHOL 224 (H) 08/04/2019     Lab Results   Component Value Date    TRIG 124 08/04/2019     Lab Results   Component Value Date    HDL 62 (H) 08/04/2019     Lab Results   Component Value Date    LDLCALC 137 (H) 08/04/2019     Lab Results   Component Value Date    LABVLDL 25 08/04/2019     No results found for: [facility]    Lab Results   Component Value Date    INR 1.03 10/11/2019    PROTIME 12.0 10/11/2019       The 10-year ASCVD risk score Mikey Bussing DC Jr., et al., 2013) is: 17.4%    Values used to calculate the score:      Age: 37 years      Sex: Female      Is Non-Hispanic African American: Yes      Diabetic: No      Tobacco smoker: No      Systolic Blood Pressure: 474 mmHg      Is BP treated: Yes      HDL Cholesterol: 62 mg/dL      Total Cholesterol: 224 mg/dL      Assessment / Plan:      Diagnosis Orders   1. Chronic diastolic heart failure ([place])     2. Resistant hypertension          1. Resistant HTN:   Patient reports compliance with meds and diet. It could be secondary HTN (elevated norepinephrine per urine test).   ??  -Will defer to PCP vs endocrinologist further investigation regarding abnormal norepi levels.   -Cw coreg 25 bid  -Off ARB due to AKI on CKD  -Will increase hydralazine from 50 to 100 bid  -Low salt diet  ??  2. Chronic HFpEF:   Patient appears euvolemic.   ??  -BP control as  above.   -Low salt diet  -Will stop torsemide in view of AKI  -BMP in 2 weeks  ??  4. Right renal lesion:   It is likely a cyst, however, I cannot exclude malignancy (indeterminate per [name]); CT abdomen suggests cyst.     5.  AKI on CKD:  It is most likely due to mild dehydration.    -We will stop diuretics  -Patient will follow with nephrology soon.        Return in about 4 weeks (around 02/29/2020).      I have spent 40 minutes of face to face time with the patient with more than 50% spent counseling and coordinating care.       I have personally reviewed the reports and images of labs, radiological studies, cardiac studies including ECG's and telemetry, current and old medical records. The note was completed using EMR and Dragon dictation system. Every effort was made to ensure accuracy; however, inadvertent computerized transcription errors may be present.    All questions and concerns were addressed to the patient/family. Alternatives to my treatment were discussed.     I would like to thank you for providing me the opportunity to participate in the care of your patient. If  you have any questions, please do not hesitate to contact me.     Hervey Ard, MD, [facility], [place]  [address]  [place] [address]  Main Office Phone: [phone]  Heart Failure Hotline: [phone]  Fax: [phone]

## 2020-02-03 ENCOUNTER — Ambulatory Visit
Admit: 2020-02-03 | Discharge: 2020-02-03 | Payer: PRIVATE HEALTH INSURANCE | Attending: Nephrology | Primary: Internal Medicine

## 2020-02-03 DIAGNOSIS — N179 Acute kidney failure, unspecified: Secondary | ICD-10-CM

## 2020-02-03 MED ORDER — GABAPENTIN 300 MG PO CAPS
300 MG | ORAL_CAPSULE | Freq: Every evening | ORAL | 2 refills | Status: DC
Start: 2020-02-03 — End: 2020-12-11

## 2020-02-03 MED ORDER — HYDRALAZINE HCL 50 MG PO TABS
50 MG | ORAL_TABLET | Freq: Two times a day (BID) | ORAL | 5 refills | Status: DC
Start: 2020-02-03 — End: 2020-04-11

## 2020-02-03 NOTE — Progress Notes (Signed)
[name] MD   [name] MD FASN FNKF  [name] MD   [name] MD      [facility]      Nephrology Consult Note    Reason for Consult:  AKI     Chief Complaint:  CHF   History Obtained From:  Gabrielle Wells     History of Present Illness:    This is a 69 y.o. female who presents to the office for evaluation of  AKI on CKD 3   Pt has CHF was on diuretics   Off now for a week   BNP 200    BP low   On high dose Neurontin           Pt denies any hx of heavy or prolonged NSAID use.   There is no hx of jaundice or hepatitis or sexually transmitted disease.   Pt has no hx of collagen vascular disease or vasculitis.  No history of dysuria or frequency.No recent procedures involving IV contrast.   There is no hx of paraprotein disease.   Pt denies any hx of recurrent UTI , incontinence or nocturia or recurrent nephrolithiasis. No unusual skin rashes .   No tea coloured urine .   Medication reviewed and noted the use of ace/arb, diuretic.    Past Medical History:        Diagnosis Date   ??? CHF (congestive heart failure) ([place])    ??? 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 GERD (gastroesophageal reflux disease)    ??? HTN (hypertension)    ??? IBS (irritable bowel syndrome)    ??? Lumbar radiculopathy    ??? Morbid obesity ([place])    ??? Muscle fasciculation    ??? Neuropathic pain     back 2/2 to spinal surgeries   ??? SBO (small bowel obstruction) ([place]) 07/1999    with partial colectomy   ??? Spinal fracture of T8 vertebra ([facility])     s/p fusion    ??? Surgical site infection     back, spinal hardware    ??? Urinary incontinence        Past Surgical History:        Procedure Laterality Date   ??? ABDOMINAL HERNIA REPAIR  2010   ??? APPENDECTOMY     ??? BREAST LUMPECTOMY Bilateral     benign lesions   ??? CARDIAC CATHETERIZATION  2012   ??? CHOLECYSTECTOMY     ??? HYSTERECTOMY, TOTAL ABDOMINAL     ??? SHOULDER ARTHROPLASTY Right 2010   ??? TOTAL KNEE ARTHROPLASTY Bilateral 2009       Current  Medications:    Current Outpatient Medications   Medication Sig Dispense Refill   ??? hydrALAZINE (APRESOLINE) 100 MG tablet Take 1 tablet by mouth 2 times daily 60 tablet 3   ??? carvedilol (COREG) 25 MG tablet Take 1 tablet by mouth 2 times daily 180 tablet 3   ??? traZODone (DESYREL) 50 MG tablet Take 1 tablet by mouth nightly 90 tablet 1   ??? gabapentin (NEURONTIN) 300 MG capsule Take 1 capsule by mouth 3 times daily for 90 days. 90 capsule 2   ??? citalopram (CELEXA) 40 MG tablet Take 1 tablet by mouth daily 30 tablet 0   ??? vitamin D3 (CHOLECALCIFEROL) 25 MCG (1000 UT) TABS tablet Take 2 tablets by mouth daily 30 tablet 2   ??? ondansetron (ZOFRAN ODT) 4 MG disintegrating tablet Take 1  tablet by mouth every 8 hours as needed for Nausea 8 tablet 0   ??? loperamide (IMODIUM) 2 MG capsule Take 2 mg by mouth 4 times daily as needed for Diarrhea     ??? omeprazole (PRILOSEC) 20 MG delayed release capsule Take 1 capsule by mouth every morning (before breakfast) 90 capsule 2     No current facility-administered medications for this visit.       Allergies:  Betadine [povidone iodine], Coconut oil, Codeine, Fish-derived products, Ibuprofen, Iodine, Other, and Sulfa antibiotics    Social History:   Social History     Socioeconomic History   ??? Marital status: Single     Spouse name: Not on file   ??? Number of children: Not on file   ??? Years of education: Not on file   ??? Highest education level: Not on file   Occupational History   ??? Not on file   Tobacco Use   ??? Smoking status: Never Smoker   ??? Smokeless tobacco: Never Used   Vaping Use   ??? Vaping Use: Never used   Substance and Sexual Activity   ??? Alcohol use: Never   ??? Drug use: Never   ??? Sexual activity: Not on file   Other Topics Concern   ??? Not on file   Social History Narrative   ??? Not on file     Social Determinants of Health     Financial Resource Strain:    ??? Difficulty of Paying Living Expenses:    Food Insecurity:    ??? Worried About Charity fundraiser in the Last Year:    ???  Arboriculturist in the Last Year:    Transportation Needs:    ??? Film/video editor (Medical):    ??? Lack of Transportation (Non-Medical):    Physical Activity:    ??? Days of Exercise per Week:    ??? Minutes of Exercise per Session:    Stress:    ??? Feeling of Stress :    Social Connections:    ??? Frequency of Communication with Friends and Family:    ??? Frequency of Social Gatherings with Friends and Family:    ??? Attends Religious Services:    ??? Marine scientist or Organizations:    ??? Attends Music therapist:    ??? Marital Status:    Intimate Production manager Violence:    ??? Fear of Current or Ex-Partner:    ??? Emotionally Abused:    ??? Physically Abused:    ??? Sexually Abused:        Family History:   No family history on file.    Review of Systems:    Constitutional: No fever, no chills, no lethargy, no weakness.  HEENT:  No headache, otalgia, itchy eyes, nasal discharge or sore throat.  Cardiac:  No chest pain, dyspnea, orthopnea or PND.  Chest:  No cough, phlegm or wheezing.  Abdomen:  No abdominal pain, nausea or vomiting.  Neuro:  No focal weakness, abnormal movements orseizure like activity.  Skin:   No rashes, no itching.  GU:   No hematuria, no pyuria, no dysuria, no flank pain.  Extremities:  No swelling or joint pains.      Objective:  BP (!) 103/59    Pulse 66    Wt 282 lb 3.2 oz (128 kg)    BMI 49.99 kg/m??     Physical Exam:  General appearance:Awake, alert, in no acute distress  Skin:  warm and dry, no rash or erythema  Eyes: conjunctivae normal and sclera anicteric  ENT: :no thrush no pharyngeal congestion    Neck: without any JVD. No carotid bruits or thyromegaly. No  Lymphadenopathty:  Pulmonary: lungs are clear and without any wheezing or rhonchi   Cardiovascular:Normal S1 & S2, No S3 or  S4, No  Pericardial rub , No Murmur Abdomen: soft nontender, bowel sounds present, no organomegaly,  No ascites  Extremities: no cyanosis, clubbing or edema    Labs:    CBC:   Lab Results   Component Value Date     WBC 6.8 10/26/2019    RBC 4.88 10/26/2019    HGB 13.6 10/26/2019    HCT 42.7 10/26/2019    MCV 87.4 10/26/2019    RDW 16.6 10/26/2019    PLT 171 10/26/2019    MPV 8.6 10/26/2019      BMP:   Lab Results   Component Value Date    NA 143 01/25/2020    NA 142 01/18/2020    NA 139 10/26/2019    K 3.7 01/25/2020    K 4.3 01/18/2020    K 4.7 10/26/2019    K 4.7 10/21/2019    K 4.3 10/15/2019    CL 99 01/25/2020    CL 100 01/18/2020    CL 104 10/26/2019    CO2 20 01/25/2020    CO2 27 01/18/2020    CO2 24 10/26/2019    BUN 25 01/25/2020    BUN 29 01/18/2020    BUN 20 10/26/2019    CREATININE 2.8 01/25/2020    CREATININE 1.8 01/18/2020    CREATININE 1.5 10/26/2019    GLUCOSE 145 01/25/2020    GLUCOSE 132 01/18/2020    GLUCOSE 94 10/26/2019    CALCIUM 9.5 01/25/2020    CALCIUM 9.0 01/18/2020    CALCIUM 9.7 10/26/2019      BNP:No results found for: BNP  PHOSPHORUS:    Lab Results   Component Value Date    PHOS 3.0 10/12/2019     MAGNESIUM: No results found for: MG  ALBUMIN:   Lab Results   Component Value Date    LABALBU 4.1 10/26/2019     IRON:  No results found for: IRON  IRON SATURATION:  No results found for: LABIRON  TIBC:  No results found for: TIBC  FERRITIN:  No results found for: FERRITIN  ANA: No results found for: ANA    SPEP:   Lab Results   Component Value Date    PROT 7.7 10/26/2019     UPEP: No results found for: TPU   HEPBSAG:No results found for: HEPBSAG  HEPCAB:No results found for: HEPCAB  C3: No results found for: C3  C4: No results found for: C4  MPO ANCA: No results found for: MPO .  PR3 ANCA:  No results found for: PR3  PTH: No results found for: PTH    Urine Creatinine:    Lab Results   Component Value Date    LABCREA 378 12/20/2019     Urine Eosinophils: No results found for: UREO  Urine Protein:  No results found for: TPU  Urinalysis:  U/A:   Lab Results   Component Value Date    NITRU Negative 10/26/2019    COLORU Yellow 10/26/2019    PHUR 6.0 10/26/2019    WBCUA 3-5 10/21/2019    RBCUA None seen  10/21/2019    BACTERIA 2+ 10/21/2019    CLARITYU Clear 10/26/2019  SPECGRAV >=1.030 10/26/2019    LEUKOCYTESUR Negative 10/26/2019    UROBILINOGEN 0.2 10/26/2019    BILIRUBINUR Negative 10/26/2019    BLOODU Negative 10/26/2019    GLUCOSEU Negative 10/26/2019    [place] TRACE 10/26/2019         Radiology:  Reviewed as available.    Assessment       Plan:    AKI on CKD 3  CHF   HTN   Neuropathy   Vit D def   IBS       Plan   - AKI sec to vol changes   - EF nl   - BNP 202   - dec Hydralazine as BP low   - dec Neurtin   - GFR - 20    - CHF stable       - Avoid any NSAIDS       Thank you for the consultation.  Please do not hesitate to call with questions.    Electronically signed by [name], MD on 02/03/2020 at 4:02 PM

## 2020-02-11 ENCOUNTER — Encounter: Attending: Cardiovascular Disease | Primary: Internal Medicine

## 2020-02-15 ENCOUNTER — Telehealth

## 2020-02-15 MED ORDER — CITALOPRAM HYDROBROMIDE 40 MG PO TABS
40 MG | ORAL_TABLET | Freq: Every day | ORAL | 3 refills | Status: DC
Start: 2020-02-15 — End: 2020-04-18

## 2020-02-15 MED ORDER — HYDROCHLOROTHIAZIDE 25 MG PO TABS
25 MG | ORAL_TABLET | Freq: Every day | ORAL | 3 refills | Status: DC
Start: 2020-02-15 — End: 2020-04-04

## 2020-02-15 NOTE — Telephone Encounter (Signed)
Last ov - 01/28/2020 - Dr. Trevor Iha  Next ov - 05/03/2020 -     Citalopram last filled  12/10/2019  -0 rf

## 2020-02-15 NOTE — Telephone Encounter (Signed)
Informed pt [facility] would like pt to start HCTZ 12.5 mg daily and recheck BMP in 2 weeks. Pt verbalized understanding.

## 2020-02-15 NOTE — Telephone Encounter (Signed)
Pt c/o SOB, fatigue, pt's wt is 284, BP 142/82 HR 87. Pt was recently taken off of the torsemide at last OV. Please advise.

## 2020-02-15 NOTE — Telephone Encounter (Signed)
The patient called stating she is having some bad swelling in her feet and ankles. The patient states she can barley put her shoes on. Please call the patient at [phone] to advise.

## 2020-02-17 ENCOUNTER — Encounter: Primary: Internal Medicine

## 2020-02-17 NOTE — Telephone Encounter (Signed)
LM informing Heather [facility] reviewed pt's blood work from 02/10/20 results were all within normal limits. Heather to call back with any questions.

## 2020-03-07 ENCOUNTER — Encounter

## 2020-03-07 ENCOUNTER — Ambulatory Visit
Admit: 2020-03-07 | Discharge: 2020-03-07 | Payer: PRIVATE HEALTH INSURANCE | Attending: Nephrology | Primary: Internal Medicine

## 2020-03-07 DIAGNOSIS — N179 Acute kidney failure, unspecified: Secondary | ICD-10-CM

## 2020-03-07 MED ORDER — ONDANSETRON 4 MG PO TBDP
4 MG | ORAL_TABLET | Freq: Three times a day (TID) | ORAL | 0 refills | Status: DC | PRN
Start: 2020-03-07 — End: 2020-12-14

## 2020-03-07 NOTE — Progress Notes (Signed)
[name] MD   [name] MD FASN FNKF  [name] MD   [name] MD      [facility]      Nephrology Consult Note    Reason for Consult:  AKI     Chief Complaint:  CHF   History Obtained From:  Patient     03/07/20     Pt GFR was low   No labs done   No Diarrhea   BP better     Wt Readings from Last 3 Encounters:   03/07/20 277 lb (125.6 kg)   02/03/20 282 lb 3.2 oz (128 kg)   02/01/20 280 lb 3.2 oz (127.1 kg)       History of Present Illness:    This is a 69 y.o. female who presents to the office for evaluation of  AKI on CKD 3   Pt has CHF was on diuretics   Off now for a week   BNP 200    BP low   On high dose Neurontin           Pt denies any hx of heavy or prolonged NSAID use.   There is no hx of jaundice or hepatitis or sexually transmitted disease.   Pt has no hx of collagen vascular disease or vasculitis.  No history of dysuria or frequency.No recent procedures involving IV contrast.   There is no hx of paraprotein disease.   Pt denies any hx of recurrent UTI , incontinence or nocturia or recurrent nephrolithiasis. No unusual skin rashes .   No tea coloured urine .   Medication reviewed and noted the use of ace/arb, diuretic.    Past Medical History:        Diagnosis Date   ??? CHF (congestive heart failure) ([place])    ??? 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 GERD (gastroesophageal reflux disease)    ??? HTN (hypertension)    ??? IBS (irritable bowel syndrome)    ??? Lumbar radiculopathy    ??? Morbid obesity ([place])    ??? Muscle fasciculation    ??? Neuropathic pain     back 2/2 to spinal surgeries   ??? SBO (small bowel obstruction) ([place]) 07/1999    with partial colectomy   ??? Spinal fracture of T8 vertebra ([facility])     s/p fusion    ??? Surgical site infection     back, spinal hardware    ??? Urinary incontinence        Past Surgical History:        Procedure Laterality Date   ??? ABDOMINAL HERNIA REPAIR  2010   ??? APPENDECTOMY     ??? BREAST LUMPECTOMY  Bilateral     benign lesions   ??? CARDIAC CATHETERIZATION  2012   ??? CHOLECYSTECTOMY     ??? HYSTERECTOMY, TOTAL ABDOMINAL     ??? SHOULDER ARTHROPLASTY Right 2010   ??? TOTAL KNEE ARTHROPLASTY Bilateral 2009       Current Medications:    Current Outpatient Medications   Medication Sig Dispense Refill   ??? hydroCHLOROthiazide (HYDRODIURIL) 25 MG tablet Take 0.5 tablets by mouth daily 45 tablet 3   ??? citalopram (CELEXA) 40 MG tablet TAKE 1 TABLET BY MOUTH DAILY 30 tablet 3   ??? hydrALAZINE (APRESOLINE) 50 MG tablet Take 1 tablet by mouth 2 times daily 60 tablet 5   ??? gabapentin (NEURONTIN) 300 MG capsule Take 1 capsule  by mouth nightly for 90 days. 90 capsule 2   ??? carvedilol (COREG) 25 MG tablet Take 1 tablet by mouth 2 times daily 180 tablet 3   ??? traZODone (DESYREL) 50 MG tablet Take 1 tablet by mouth nightly 90 tablet 1   ??? vitamin D3 (CHOLECALCIFEROL) 25 MCG (1000 UT) TABS tablet Take 2 tablets by mouth daily 30 tablet 2   ??? ondansetron (ZOFRAN ODT) 4 MG disintegrating tablet Take 1 tablet by mouth every 8 hours as needed for Nausea 8 tablet 0   ??? loperamide (IMODIUM) 2 MG capsule Take 2 mg by mouth 4 times daily as needed for Diarrhea     ??? omeprazole (PRILOSEC) 20 MG delayed release capsule Take 1 capsule by mouth every morning (before breakfast) 90 capsule 2     No current facility-administered medications for this visit.       Allergies:  Betadine [povidone iodine], Coconut oil, Codeine, Fish-derived products, Ibuprofen, Iodine, Other, and Sulfa antibiotics    Social History:   Social History     Socioeconomic History   ??? Marital status: Single     Spouse name: Not on file   ??? Number of children: Not on file   ??? Years of education: Not on file   ??? Highest education level: Not on file   Occupational History   ??? Not on file   Tobacco Use   ??? Smoking status: Never Smoker   ??? Smokeless tobacco: Never Used   Vaping Use   ??? Vaping Use: Never used   Substance and Sexual Activity   ??? Alcohol use: Never   ??? Drug use: Never   ???  Sexual activity: Not on file   Other Topics Concern   ??? Not on file   Social History Narrative   ??? Not on file     Social Determinants of Health     Financial Resource Strain:    ??? Difficulty of Paying Living Expenses:    Food Insecurity:    ??? Worried About Charity fundraiser in the Last Year:    ??? Arboriculturist in the Last Year:    Transportation Needs:    ??? Film/video editor (Medical):    ??? Lack of Transportation (Non-Medical):    Physical Activity:    ??? Days of Exercise per Week:    ??? Minutes of Exercise per Session:    Stress:    ??? Feeling of Stress :    Social Connections:    ??? Frequency of Communication with Friends and Family:    ??? Frequency of Social Gatherings with Friends and Family:    ??? Attends Religious Services:    ??? Marine scientist or Organizations:    ??? Attends Music therapist:    ??? Marital Status:    Intimate Production manager Violence:    ??? Fear of Current or Ex-Partner:    ??? Emotionally Abused:    ??? Physically Abused:    ??? Sexually Abused:        Family History:   No family history on file.    Review of Systems:    Constitutional: No fever, no chills, no lethargy, no weakness.  HEENT:  No headache, otalgia, itchy eyes, nasal discharge or sore throat.  Cardiac:  No chest pain, dyspnea, orthopnea or PND.  Chest:  No cough, phlegm or wheezing.  Abdomen:  No abdominal pain, nausea or vomiting.  Neuro:  No focal weakness, abnormal movements orseizure  like activity.  Skin:   No rashes, no itching.  GU:   No hematuria, no pyuria, no dysuria, no flank pain.  Extremities:  No swelling or joint pains.      Objective:  BP (!) 177/92    Pulse 70    Wt 277 lb (125.6 kg)    BMI 49.07 kg/m??     Physical Exam:  General appearance:Awake, alert, in no acute distress  Skin: warm and dry, no rash or erythema  Eyes: conjunctivae normal and sclera anicteric  ENT: :no thrush no pharyngeal congestion    Neck: without any JVD. No carotid bruits or thyromegaly. No  Lymphadenopathty:  Pulmonary: lungs are  clear and without any wheezing or rhonchi   Cardiovascular:Normal S1 & S2, No S3 or  S4, No  Pericardial rub , No Murmur Abdomen: soft nontender, bowel sounds present, no organomegaly,  No ascites  Extremities: no cyanosis, clubbing or edema    Labs:    CBC:   Lab Results   Component Value Date    WBC 6.8 10/26/2019    RBC 4.88 10/26/2019    HGB 13.6 10/26/2019    HCT 42.7 10/26/2019    MCV 87.4 10/26/2019    RDW 16.6 10/26/2019    PLT 171 10/26/2019    MPV 8.6 10/26/2019      BMP:   Lab Results   Component Value Date    NA 143 01/25/2020    NA 142 01/18/2020    NA 139 10/26/2019    K 3.7 01/25/2020    K 4.3 01/18/2020    K 4.7 10/26/2019    K 4.7 10/21/2019    K 4.3 10/15/2019    CL 99 01/25/2020    CL 100 01/18/2020    CL 104 10/26/2019    CO2 20 01/25/2020    CO2 27 01/18/2020    CO2 24 10/26/2019    BUN 25 01/25/2020    BUN 29 01/18/2020    BUN 20 10/26/2019    CREATININE 2.8 01/25/2020    CREATININE 1.8 01/18/2020    CREATININE 1.5 10/26/2019    GLUCOSE 145 01/25/2020    GLUCOSE 132 01/18/2020    GLUCOSE 94 10/26/2019    CALCIUM 9.5 01/25/2020    CALCIUM 9.0 01/18/2020    CALCIUM 9.7 10/26/2019      BNP:No results found for: BNP  PHOSPHORUS:    Lab Results   Component Value Date    PHOS 3.0 10/12/2019     MAGNESIUM: No results found for: MG  ALBUMIN:   Lab Results   Component Value Date    LABALBU 4.1 10/26/2019     IRON:  No results found for: IRON  IRON SATURATION:  No results found for: LABIRON  TIBC:  No results found for: TIBC  FERRITIN:  No results found for: FERRITIN  ANA: No results found for: ANA    SPEP:   Lab Results   Component Value Date    PROT 7.7 10/26/2019     UPEP: No results found for: TPU   HEPBSAG:No results found for: HEPBSAG  HEPCAB:No results found for: HEPCAB  C3: No results found for: C3  C4: No results found for: C4  MPO ANCA: No results found for: MPO .  PR3 ANCA:  No results found for: PR3  PTH: No results found for: PTH    Urine Creatinine:    Lab Results   Component Value Date     LABCREA 378 12/20/2019  Urine Eosinophils: No results found for: UREO  Urine Protein:  No results found for: TPU  Urinalysis:  U/A:   Lab Results   Component Value Date    NITRU Negative 10/26/2019    COLORU Yellow 10/26/2019    PHUR 6.0 10/26/2019    WBCUA 3-5 10/21/2019    RBCUA None seen 10/21/2019    BACTERIA 2+ 10/21/2019    CLARITYU Clear 10/26/2019    SPECGRAV >=1.030 10/26/2019    LEUKOCYTESUR Negative 10/26/2019    UROBILINOGEN 0.2 10/26/2019    BILIRUBINUR Negative 10/26/2019    BLOODU Negative 10/26/2019    GLUCOSEU Negative 10/26/2019    [place] TRACE 10/26/2019         Radiology:  Reviewed as available.    Assessment       Plan:    AKI on CKD 3  CHF   HTN   Neuropathy   Vit D def   IBS       Plan   - AKI sec to vol changes   - Hold HCTZ until PO intake better   - EF nl   - BNP 202   - dec Hydralazine as BP low   - dec Neurontin   - GFR - 20    - CHF stable       - Avoid any NSAIDS       Thank you for the consultation.  Please do not hesitate to call with questions.    Electronically signed by [name], MD on 03/07/2020 at 3:04 PM

## 2020-03-08 LAB — RENAL FUNCTION PANEL
Albumin: 4.4 g/dL (ref 3.4–5.0)
Anion Gap: 15 (ref 3–16)
BUN: 7 mg/dL (ref 7–20)
CO2: 23 mmol/L (ref 21–32)
Calcium: 9.2 mg/dL (ref 8.3–10.6)
Chloride: 106 mmol/L (ref 99–110)
Creatinine: 1 mg/dL (ref 0.6–1.2)
GFR African American: >60 (ref >60)
GFR Non-African American: 55 — AB (ref >60)
Glucose: 94 mg/dL (ref 70–99)
Phosphorus: 2.9 mg/dL (ref 2.5–4.9)
Potassium: 4.2 mmol/L (ref 3.5–5.1)
Sodium: 144 mmol/L (ref 136–145)

## 2020-03-08 LAB — KAPPA/LAMBDA QUANTITATIVE FREE LIGHT CHAINS, SERUM
K/L RATIO: 0.02 — ABNORMAL LOW (ref 0.26–1.65)
KAPPA, FREE LIGHT CHAINS, SERUM: 11.21 mg/L (ref 3.30–19.40)
LAMBDA, FREE LIGHT CHAINS, SERUM: 667.71 mg/L — ABNORMAL HIGH (ref 5.71–26.30)

## 2020-03-08 LAB — MICROSCOPIC URINALYSIS

## 2020-03-08 LAB — MICROALBUMIN / CREATININE URINE RATIO
Creatinine, Ur: 446.6 mg/dL — ABNORMAL HIGH (ref 28.0–259.0)
Microalbumin Creatinine Ratio: 18.8 mg/g (ref 0.0–30.0)
Microalbumin, Random Urine: 8.4 mg/dL — ABNORMAL HIGH (ref <2.0)

## 2020-03-13 NOTE — Telephone Encounter (Signed)
From: [name]  To: [name], MD  Sent: 03/10/2020 2:45 PM EDT  Subject: Test Results Question    Dr. Silvestre Moment ,  I see my lab and urine results are back from 7/20, please let me know if I need to be seen sooner than 4 week follow up scheduled 8/17 at 12:45 pm

## 2020-03-15 ENCOUNTER — Ambulatory Visit
Admit: 2020-03-15 | Discharge: 2020-03-15 | Payer: PRIVATE HEALTH INSURANCE | Attending: Cardiovascular Disease | Primary: Internal Medicine

## 2020-03-15 DIAGNOSIS — I10 Essential (primary) hypertension: Secondary | ICD-10-CM

## 2020-03-15 MED ORDER — AMLODIPINE BESYLATE 5 MG PO TABS
5 MG | ORAL_TABLET | Freq: Every day | ORAL | 3 refills | Status: DC
Start: 2020-03-15 — End: 2020-04-06

## 2020-03-15 NOTE — Progress Notes (Signed)
Cc: Resistant HTN, HFpEF    HPI:     Gabrielle Wells??is a 69 y.o.??female??with a past medical history of morbid obesity, resistant HTN, HFpEF, CKD, GERD.??  ??  Patient was admitted at [facility] 2/22 - 10/15/19 for dehydration, AKI, sinus bradycardia (due to BB toxicity in the setting of AKI).??  ??  Echo??09/2019: nl LV, EF 25-42%, normal diastolic, normal valves and RV.   ??  ECG 10/11/19: sinus brady 55, nonspecific changes, low voltage.   ??  Renal duplex 09/2019: no RAS, indeterminate hypoechoic lesion R kidney.  ??  Urine metanephrines w/ high norepi, normal cortisol, renin and aldosterone.??    Last time due to HTN, I increased her hydralazine to 100 bid from 50 bid; her BP was too low with Dr Silvestre Moment at 103/59 mmhg, hence it was reduced back at 50 bid. She is not on any diuretics but takes HCTZ 12.5 daily only prn leg edema.   ??  Patient is here for a follow up.?? She avoid salt in her diet. Her BP at home 150-160/90-100s (was up to 220/110 last Sunday). Her Cr is back to normal 1.0 (7/20) from 2.8. She denies any symptoms.       Histories     Past Medical History:   has a past medical history of CHF (congestive heart failure) ([place]), Epidural abscess, GERD (gastroesophageal reflux disease), HTN (hypertension), IBS (irritable bowel syndrome), Lumbar radiculopathy, Morbid obesity ([place]), Muscle fasciculation, Neuropathic pain, SBO (small bowel obstruction) ([place]), Spinal fracture of T8 vertebra ([place]), Surgical site infection, and Urinary incontinence.    Surgical History:   has a past surgical history that includes Appendectomy; Breast lumpectomy (Bilateral); Hysterectomy, total abdominal; Cholecystectomy; Total knee arthroplasty (Bilateral, 2009); Cardiac catheterization (2012); Total shoulder arthroplasty (Right, 2010); and Abdominal hernia repair (2010).     Social History:   reports that she has never smoked. She has never used smokeless tobacco. She reports that she does not drink alcohol and does not use drugs.     Family  History:  No evidence for sudden cardiac death or premature CAD      Medications:     Home medications were reviewed and are listed below    Prior to Admission medications    Medication Sig Start Date End Date Taking? Authorizing Provider   amLODIPine (NORVASC) 5 MG tablet Take 1 tablet by mouth daily 03/15/20  Yes Hervey Ard, MD   ondansetron (ZOFRAN ODT) 4 MG disintegrating tablet Take 1 tablet by mouth every 8 hours as needed for Nausea 03/07/20  Yes Arshdeep Tindni, MD   citalopram (CELEXA) 40 MG tablet TAKE 1 TABLET BY MOUTH DAILY 02/15/20  Yes Jeanette Caprice, MD   hydrALAZINE (APRESOLINE) 50 MG tablet Take 1 tablet by mouth 2 times daily 02/03/20  Yes Arshdeep Tindni, MD   gabapentin (NEURONTIN) 300 MG capsule Take 1 capsule by mouth nightly for 90 days. 02/03/20 05/03/20 Yes Arshdeep Tindni, MD   carvedilol (COREG) 25 MG tablet Take 1 tablet by mouth 2 times daily 01/07/20  Yes Hervey Ard, MD   traZODone (DESYREL) 50 MG tablet Take 1 tablet by mouth nightly 12/10/19  Yes Hyman Bower, DO   vitamin D3 (CHOLECALCIFEROL) 25 MCG (1000 UT) TABS tablet Take 2 tablets by mouth daily 11/10/19  Yes Bebe Liter, MD   loperamide (IMODIUM) 2 MG capsule Take 2 mg by mouth 4 times daily as needed for Diarrhea   Yes Historical Provider, MD   omeprazole (PRILOSEC) 20  MG delayed release capsule Take 1 capsule by mouth every morning (before breakfast) 09/08/19  Yes Delmar Landau, MD   hydroCHLOROthiazide (HYDRODIURIL) 25 MG tablet Take 0.5 tablets by mouth daily  Patient not taking: Reported on 03/15/2020 02/15/20   Hervey Ard, MD          Allergy:     Betadine [povidone iodine], Coconut oil, Codeine, Fish-derived products, Ibuprofen, Iodine, Other, and Sulfa antibiotics       Review of Systems:     All 12 point review of symptoms completed. Pertinent positives identified in the HPI, all other review of symptoms negative as below.    CONSTITUTIONAL: No fatigue  SKIN: No rash or pruritis.  EYES: No visual changes or  diplopia. No scleral icterus.  ENT: No Headaches, hearing loss or vertigo. No mouth sores or sore throat.  CARDIOVASCULAR: No chest pain/chest pressure/chest discomfort. No palpitations. No edema.   RESPIRATORY: No dyspnea. No cough or wheezing, no sputum production.  GASTROINTESTINAL: No N/V/D. No abdominal pain, appetite loss, blood in stools.  GENITOURINARY: No dysuria, trouble voiding, or hematuria.  MUSCULOSKELETAL:  No gait disturbance, weakness or joint complaints.  NEUROLOGICAL: No headache, diplopia, change in muscle strength, numbness or tingling. No change in gait, balance, coordination, mood, affect, memory, mentation, behavior.  PSHYCH: No anxiety, loss of interest, change in sexual behavior, feelings of self-harm, or confusion.  ENDOCRINE: No excessive thirst, fluid intake, or urination. No tremor.  HEMATOLOGIC: No abnormal bruising or bleeding.  ALLERGY: No nasal congestion or hives.      Physical Examination:     Vitals:    03/15/20 1028 03/15/20 1035   BP: (!) 158/98 (!) 150/90   Pulse: 72    Weight: 276 lb (125.2 kg)        Wt Readings from Last 3 Encounters:   03/15/20 276 lb (125.2 kg)   03/07/20 277 lb (125.6 kg)   02/03/20 282 lb 3.2 oz (128 kg)         General Appearance:  Alert, cooperative, no distress, appears stated age Appropriate weight   Head:  Normocephalic, without obvious abnormality, atraumatic   Eyes:  PERRL, conjunctiva/corneas clear EOM intact  Ears normal   Throat no lesions       Nose: Nares normal, no drainage or sinus tenderness   Throat: Lips, mucosa, and tongue normal   Neck: Supple, symmetrical, trachea midline, no adenopathy, thyroid: not enlarged, symmetric, no tenderness/mass/nodules, no carotid bruit       Lungs:   Clear to auscultation bilaterally, respirations unlabored   Chest Wall:  No tenderness or deformity   Heart:  Regular rhythm, rate is controlled, S1, S2 normal, there is no murmur, there is no rub or gallop, no jvd, no bilateral lower extremity edema    Abdomen:   Soft, non-tender, bowel sounds active all four quadrants,  no masses, no organomegaly       Extremities: Extremities normal, atraumatic, no cyanosis   Pulses: 2+ and symmetric   Skin: Skin color, texture, turgor normal, no rashes or lesions   Pysch: Normal mood and affect   Neurologic: Normal gross motor and sensory exam.  Cranial nerves intact        Labs:     Lab Results   Component Value Date    WBC 6.8 10/26/2019    HGB 13.6 10/26/2019    HCT 42.7 10/26/2019    MCV 87.4 10/26/2019    PLT 171 10/26/2019     Lab Results  Component Value Date    NA 144 03/07/2020    K 4.2 03/07/2020    CL 106 03/07/2020    CO2 23 03/07/2020    BUN 7 03/07/2020    CREATININE 1.0 03/07/2020    GLUCOSE 94 03/07/2020    CALCIUM 9.2 03/07/2020    PROT 7.7 10/26/2019    LABALBU 4.4 03/07/2020    BILITOT <0.2 10/26/2019    ALKPHOS 71 10/26/2019    AST 18 10/26/2019    ALT 12 10/26/2019    LABGLOM 55 (A) 03/07/2020    GFRAA >60 03/07/2020    AGRATIO 1.1 10/26/2019    GLOB 3.6 10/26/2019         Lab Results   Component Value Date    CHOL 224 (H) 08/04/2019     Lab Results   Component Value Date    TRIG 124 08/04/2019     Lab Results   Component Value Date    HDL 62 (H) 08/04/2019     Lab Results   Component Value Date    LDLCALC 137 (H) 08/04/2019     Lab Results   Component Value Date    LABVLDL 25 08/04/2019     No results found for: [facility]    Lab Results   Component Value Date    INR 1.03 10/11/2019    PROTIME 12.0 10/11/2019       The 10-year ASCVD risk score Mikey Bussing DC Jr., et al., 2013) is: 17.8%    Values used to calculate the score:      Age: 71 years      Sex: Female      Is Non-Hispanic African American: Yes      Diabetic: No      Tobacco smoker: No      Systolic Blood Pressure: 811 mmHg      Is BP treated: Yes      HDL Cholesterol: 62 mg/dL      Total Cholesterol: 224 mg/dL      Assessment / Plan:      Diagnosis Orders   1. Resistant hypertension          1. Resistant HTN:   Patient reports compliance with meds  and diet. It could be secondary HTN??(elevated norepinephrine per urine test).   ??  -Will defer to PCP vs endocrinologist further investigation regarding abnormal norepi levels.??  -Cw coreg 25 bid  -Off ARB due to AKI on CKD  -Cw hydralazine 50 mg bid (will not increase to 100 bid due to causing severe hypotension).   -Will add amlodipine 5 mg daily.   -Low salt diet  ??  2. Chronic HFpEF:   Patient appears euvolemic.??  ??  -BP control as above.   -Low salt diet  -Agree with taking HCTZ only prn.   ??  4. Right renal lesion:   It is likely a cyst, however, I cannot exclude malignancy (indeterminate per [name]); CT abdomen suggests cyst.   ??  5.  AKI on CKD:  It is most likely due to mild dehydration from diuretics. It has now resolved off diuretics.   ??  -Follows with Dr Silvestre Moment        Return in about 3 months (around 06/15/2020).      I have spent 35 minutes of face to face time with the patient with more than 50% spent counseling and coordinating care.       I have personally reviewed the reports and images of labs, radiological studies,  cardiac studies including ECG's and telemetry, current and old medical records. The note was completed using EMR and Dragon dictation system. Every effort was made to ensure accuracy; however, inadvertent computerized transcription errors may be present.    All questions and concerns were addressed to the patient/family. Alternatives to my treatment were discussed.     I would like to thank you for providing me the opportunity to participate in the care of your patient. If you have any questions, please do not hesitate to contact me.     Hervey Ard, MD, [facility], [place]  [address]  [place] [address]  Main Office Phone: [phone]  Heart Failure Hotline: [phone]  Fax: [phone]

## 2020-03-27 NOTE — Telephone Encounter (Signed)
MEGAN FROM [employer] ON AGING NEED MED LISTED FAXED TO HER AT [id number], [place]

## 2020-03-27 NOTE — Telephone Encounter (Signed)
Faxed requested information

## 2020-04-04 ENCOUNTER — Ambulatory Visit
Admit: 2020-04-04 | Discharge: 2020-04-04 | Payer: PRIVATE HEALTH INSURANCE | Attending: Nephrology | Primary: Internal Medicine

## 2020-04-04 DIAGNOSIS — R197 Diarrhea, unspecified: Secondary | ICD-10-CM

## 2020-04-04 NOTE — Progress Notes (Signed)
[name] MD   [name] MD FASN FNKF  [name] MD   [name] MD      [facility]      Nephrology Consult Note    Reason for Consult:  AKI     Chief Complaint:  CHF   History Obtained From:  Patient     04/04/20     GFR better   BP controlled   Oral intake marginal   + Diarrhea       Wt Readings from Last 3 Encounters:   04/04/20 271 lb 3.2 oz (123 kg)   03/15/20 276 lb (125.2 kg)   03/07/20 277 lb (125.6 kg)       History of Present Illness:    This is a 69 y.o. female who presents to the office for evaluation of  AKI on CKD 3   Pt has CHF was on diuretics   Off now for a week   BNP 200    BP low   On high dose Neurontin           Pt denies any hx of heavy or prolonged NSAID use.   There is no hx of jaundice or hepatitis or sexually transmitted disease.   Pt has no hx of collagen vascular disease or vasculitis.  No history of dysuria or frequency.No recent procedures involving IV contrast.   There is no hx of paraprotein disease.   Pt denies any hx of recurrent UTI , incontinence or nocturia or recurrent nephrolithiasis. No unusual skin rashes .   No tea coloured urine .   Medication reviewed and noted the use of ace/arb, diuretic.    Past Medical History:        Diagnosis Date   ??? CHF (congestive heart failure) ([employer])    ??? 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 GERD (gastroesophageal reflux disease)    ??? HTN (hypertension)    ??? IBS (irritable bowel syndrome)    ??? Lumbar radiculopathy    ??? Morbid obesity ([place])    ??? Muscle fasciculation    ??? Neuropathic pain     back 2/2 to spinal surgeries   ??? SBO (small bowel obstruction) ([place]) 07/1999    with partial colectomy   ??? Spinal fracture of T8 vertebra (HCC)     s/p fusion    ??? Surgical site infection     back, spinal hardware    ??? Urinary incontinence        Past Surgical History:        Procedure Laterality Date   ??? ABDOMINAL HERNIA REPAIR  2010   ??? APPENDECTOMY     ??? BREAST LUMPECTOMY  Bilateral     benign lesions   ??? CARDIAC CATHETERIZATION  2012   ??? CHOLECYSTECTOMY     ??? HYSTERECTOMY, TOTAL ABDOMINAL     ??? SHOULDER ARTHROPLASTY Right 2010   ??? TOTAL KNEE ARTHROPLASTY Bilateral 2009       Current Medications:    Current Outpatient Medications   Medication Sig Dispense Refill   ??? amLODIPine (NORVASC) 5 MG tablet Take 1 tablet by mouth daily 90 tablet 3   ??? ondansetron (ZOFRAN ODT) 4 MG disintegrating tablet Take 1 tablet by mouth every 8 hours as needed for Nausea 60 tablet 0   ??? hydroCHLOROthiazide (HYDRODIURIL) 25 MG tablet Take 0.5 tablets by mouth daily (Patient not taking: Reported on 03/15/2020) 45 tablet 3   ???  citalopram (CELEXA) 40 MG tablet TAKE 1 TABLET BY MOUTH DAILY 30 tablet 3   ??? hydrALAZINE (APRESOLINE) 50 MG tablet Take 1 tablet by mouth 2 times daily 60 tablet 5   ??? gabapentin (NEURONTIN) 300 MG capsule Take 1 capsule by mouth nightly for 90 days. 90 capsule 2   ??? carvedilol (COREG) 25 MG tablet Take 1 tablet by mouth 2 times daily 180 tablet 3   ??? traZODone (DESYREL) 50 MG tablet Take 1 tablet by mouth nightly 90 tablet 1   ??? vitamin D3 (CHOLECALCIFEROL) 25 MCG (1000 UT) TABS tablet Take 2 tablets by mouth daily 30 tablet 2   ??? loperamide (IMODIUM) 2 MG capsule Take 2 mg by mouth 4 times daily as needed for Diarrhea     ??? omeprazole (PRILOSEC) 20 MG delayed release capsule Take 1 capsule by mouth every morning (before breakfast) 90 capsule 2     No current facility-administered medications for this visit.       Allergies:  Betadine [povidone iodine], Coconut oil, Codeine, Fish-derived products, Ibuprofen, Iodine, Other, and Sulfa antibiotics    Social History:   Social History     Socioeconomic History   ??? Marital status: Single     Spouse name: Not on file   ??? Number of children: Not on file   ??? Years of education: Not on file   ??? Highest education level: Not on file   Occupational History   ??? Not on file   Tobacco Use   ??? Smoking status: Never Smoker   ??? Smokeless tobacco: Never  Used   Vaping Use   ??? Vaping Use: Never used   Substance and Sexual Activity   ??? Alcohol use: Never   ??? Drug use: Never   ??? Sexual activity: Not on file   Other Topics Concern   ??? Not on file   Social History Narrative   ??? Not on file     Social Determinants of Health     Financial Resource Strain:    ??? Difficulty of Paying Living Expenses:    Food Insecurity:    ??? Worried About Charity fundraiser in the Last Year:    ??? Arboriculturist in the Last Year:    Transportation Needs:    ??? Film/video editor (Medical):    ??? Lack of Transportation (Non-Medical):    Physical Activity:    ??? Days of Exercise per Week:    ??? Minutes of Exercise per Session:    Stress:    ??? Feeling of Stress :    Social Connections:    ??? Frequency of Communication with Friends and Family:    ??? Frequency of Social Gatherings with Friends and Family:    ??? Attends Religious Services:    ??? Marine scientist or Organizations:    ??? Attends Music therapist:    ??? Marital Status:    Intimate Production manager Violence:    ??? Fear of Current or Ex-Partner:    ??? Emotionally Abused:    ??? Physically Abused:    ??? Sexually Abused:        Family History:   No family history on file.    Review of Systems:    Constitutional: No fever, no chills, no lethargy, no weakness.  HEENT:  No headache, otalgia, itchy eyes, nasal discharge or sore throat.  Cardiac:  No chest pain, dyspnea, orthopnea or PND.  Chest:  No cough, phlegm or  wheezing.  Abdomen:  No abdominal pain, nausea or vomiting.  Neuro:  No focal weakness, abnormal movements orseizure like activity.  Skin:   No rashes, no itching.  GU:   No hematuria, no pyuria, no dysuria, no flank pain.  Extremities:  No swelling or joint pains.      Objective:  BP (!) 144/96    Pulse 92    Temp 97.8 ??F (36.6 ??C)    Wt 271 lb 3.2 oz (123 kg)    BMI 48.04 kg/m??     Physical Exam:  General appearance:Awake, alert, in no acute distress  Skin: warm and dry, no rash or erythema  Eyes: conjunctivae normal and sclera  anicteric  ENT: :no thrush no pharyngeal congestion    Neck: without any JVD. No carotid bruits or thyromegaly. No  Lymphadenopathty:  Pulmonary: lungs are clear and without any wheezing or rhonchi   Cardiovascular:Normal S1 & S2, No S3 or  S4, No  Pericardial rub , No Murmur Abdomen: soft nontender, bowel sounds present, no organomegaly,  No ascites  Extremities: no cyanosis, clubbing or edema    Labs:    CBC:   Lab Results   Component Value Date    WBC 6.8 10/26/2019    RBC 4.88 10/26/2019    HGB 13.6 10/26/2019    HCT 42.7 10/26/2019    MCV 87.4 10/26/2019    RDW 16.6 10/26/2019    PLT 171 10/26/2019    MPV 8.6 10/26/2019      BMP:   Lab Results   Component Value Date    NA 144 03/07/2020    NA 143 01/25/2020    NA 142 01/18/2020    K 4.2 03/07/2020    K 3.7 01/25/2020    K 4.3 01/18/2020    K 4.7 10/26/2019    K 4.7 10/21/2019    K 4.3 10/15/2019    CL 106 03/07/2020    CL 99 01/25/2020    CL 100 01/18/2020    CO2 23 03/07/2020    CO2 20 01/25/2020    CO2 27 01/18/2020    BUN 7 03/07/2020    BUN 25 01/25/2020    BUN 29 01/18/2020    CREATININE 1.0 03/07/2020    CREATININE 2.8 01/25/2020    CREATININE 1.8 01/18/2020    GLUCOSE 94 03/07/2020    GLUCOSE 145 01/25/2020    GLUCOSE 132 01/18/2020    CALCIUM 9.2 03/07/2020    CALCIUM 9.5 01/25/2020    CALCIUM 9.0 01/18/2020      BNP:No results found for: BNP  PHOSPHORUS:    Lab Results   Component Value Date    PHOS 2.9 03/07/2020    PHOS 3.0 10/12/2019     MAGNESIUM: No results found for: MG  ALBUMIN:   Lab Results   Component Value Date    LABALBU 4.4 03/07/2020     IRON:  No results found for: IRON  IRON SATURATION:  No results found for: LABIRON  TIBC:  No results found for: TIBC  FERRITIN:  No results found for: FERRITIN  ANA: No results found for: ANA    SPEP:   Lab Results   Component Value Date    PROT 7.7 10/26/2019     UPEP: No results found for: TPU   HEPBSAG:No results found for: HEPBSAG  HEPCAB:No results found for: HEPCAB  C3: No results found for:  C3  C4: No results found for: C4  MPO ANCA: No results found for: MPO .  PR3 ANCA:  No results found for: PR3  PTH: No results found for: PTH    Urine Creatinine:    Lab Results   Component Value Date    LABCREA 446.6 03/07/2020     Urine Eosinophils: No results found for: UREO  Urine Protein:  No results found for: TPU  Urinalysis:  U/A:   Lab Results   Component Value Date    NITRU Negative 10/26/2019    COLORU Yellow 10/26/2019    PHUR 6.0 10/26/2019    WBCUA 3-5 03/07/2020    RBCUA 0-2 03/07/2020    BACTERIA Rare 03/07/2020    CLARITYU Clear 10/26/2019    SPECGRAV >=1.030 10/26/2019    LEUKOCYTESUR Negative 10/26/2019    UROBILINOGEN 0.2 10/26/2019    BILIRUBINUR Negative 10/26/2019    BLOODU Negative 10/26/2019    GLUCOSEU Negative 10/26/2019    KETUA TRACE 10/26/2019    AMORPHOUS Rare 03/07/2020     Wt Readings from Last 3 Encounters:   04/04/20 271 lb 3.2 oz (123 kg)   03/15/20 276 lb (125.2 kg)   03/07/20 277 lb (125.6 kg)       Radiology:  Reviewed as available.    Assessment        Plan:    AKI on CKD 3  CHF   HTN   Neuropathy   Vit D def   IBS       Plan   - AKI sec to vol changes   - Hold HCTZ until PO intake better   - EF nl   - BNP 202   - BP controlled   - dec Neurontin   - GFR - 20--->60     - CHF stable       - Avoid any NSAIDS       Thank you for the consultation.  Please do not hesitate to call with questions.    Electronically signed by [name], MD on 04/04/2020 at 12:40 PM

## 2020-04-05 NOTE — Telephone Encounter (Signed)
[place] to call line has been busy. Will try again later.

## 2020-04-05 NOTE — Telephone Encounter (Signed)
Heather RN from [place] called to report the patient's B/P     03/20/20  132/106 HR 74    03/27/20  170/110 HR 85    04/04/20  146/116 HR 80    The complains of headaches only     Please call Heather back at [phone] to advise.

## 2020-04-06 MED ORDER — AMLODIPINE BESYLATE 5 MG PO TABS
5 MG | ORAL_TABLET | Freq: Every day | ORAL | 3 refills | Status: DC
Start: 2020-04-06 — End: 2020-05-26

## 2020-04-06 NOTE — Telephone Encounter (Signed)
Spoke with pt she has not taken her Amlodipine in quit some time. GEB would like pt to resume taking the amlodipine. Pt verbalized understanding.    LM informing Heather [facility] pt is resuming amlodipine 5 mg daily. Advise Heather to call back with any question or concerns.

## 2020-04-10 ENCOUNTER — Encounter

## 2020-04-10 NOTE — Telephone Encounter (Signed)
Refill Omeprazole  Last OV 01-18-20 Dr.Antic  Next appt. 05-03-20 Dr.George

## 2020-04-11 MED ORDER — OMEPRAZOLE 20 MG PO CPDR
20 MG | ORAL_CAPSULE | Freq: Every day | ORAL | 1 refills | Status: DC
Start: 2020-04-11 — End: 2020-11-28

## 2020-04-11 MED ORDER — HYDRALAZINE HCL 50 MG PO TABS
50 MG | ORAL_TABLET | Freq: Three times a day (TID) | ORAL | 3 refills | Status: DC
Start: 2020-04-11 — End: 2020-06-12

## 2020-04-11 NOTE — Telephone Encounter (Signed)
[place] called in with patients  bloodpressure today 152/110   [facility]  [phone] .please call heather

## 2020-04-11 NOTE — Telephone Encounter (Signed)
LM informing Gabrielle Wells would like the pt to increase the hydralazine 50 mg to tid. Advised Gabrielle to call the office with any further questions or concerns.

## 2020-04-11 NOTE — Telephone Encounter (Addendum)
Spoke to [facility] pt's BP this morning was 137/74 HR 84 this afternoon was when pt's BP was elevated. Pt takes amlodipine 5 mg daily, coreg 25 mg bid, and hydralazine 50 mg bid. GEB to advise.

## 2020-04-12 NOTE — Telephone Encounter (Signed)
Heather called back to speak to [place]. She can be reached at [phone].

## 2020-04-12 NOTE — Telephone Encounter (Signed)
LM for Heather to call the office.

## 2020-04-12 NOTE — Telephone Encounter (Signed)
Gabrielle Wells called to inform us the pt has been taking Hydralazine 100 mg bid not 50 mg bid. GEB would like the pt to take Hydralazine 100 mg tid. Gabrielle Wells verbalized understanding and will let pt know.

## 2020-04-18 MED ORDER — DULOXETINE HCL 30 MG PO CPEP
30 MG | ORAL_CAPSULE | Freq: Every day | ORAL | 5 refills | Status: DC
Start: 2020-04-18 — End: 2020-05-03

## 2020-04-18 NOTE — Telephone Encounter (Signed)
Shelly from [place] called stating that patient should be on a lower dose of citalopram or change to something else due to her age. She is not taking her medication and her depression scale is from moderate - severe. Please advise

## 2020-04-19 LAB — RENAL FUNCTION PANEL
Albumin: 4.2 g/dL (ref 3.4–5.0)
Anion Gap: 17 — ABNORMAL HIGH (ref 3–16)
BUN: 10 mg/dL (ref 7–20)
CO2: 21 mmol/L (ref 21–32)
Calcium: 9 mg/dL (ref 8.3–10.6)
Chloride: 104 mmol/L (ref 99–110)
Creatinine: 1 mg/dL (ref 0.6–1.2)
GFR African American: >60 (ref >60)
GFR Non-African American: 55 — AB (ref >60)
Glucose: 124 mg/dL — ABNORMAL HIGH (ref 70–99)
Phosphorus: 3.3 mg/dL (ref 2.5–4.9)
Potassium: 4 mmol/L (ref 3.5–5.1)
Sodium: 142 mmol/L (ref 136–145)

## 2020-04-19 LAB — CBC WITH AUTO DIFFERENTIAL
Basophils %: 0.7 %
Basophils Absolute: 0 10*3/uL (ref 0.0–0.2)
Eosinophils %: 1.2 %
Eosinophils Absolute: 0 10*3/uL (ref 0.0–0.6)
Hematocrit: 43.3 % (ref 36.0–48.0)
Hemoglobin: 14 g/dL (ref 12.0–16.0)
Lymphocytes %: 43.6 %
Lymphocytes Absolute: 1.4 10*3/uL (ref 1.0–5.1)
MCH: 27.9 pg (ref 26.0–34.0)
MCHC: 32.3 g/dL (ref 31.0–36.0)
MCV: 86.4 fL (ref 80.0–100.0)
MPV: 10.7 fL — ABNORMAL HIGH (ref 5.0–10.5)
Monocytes %: 16.9 %
Monocytes Absolute: 0.6 10*3/uL (ref 0.0–1.3)
Neutrophils %: 37.6 %
Neutrophils Absolute: 1.2 10*3/uL — ABNORMAL LOW (ref 1.7–7.7)
Platelets: 118 10*3/uL — ABNORMAL LOW (ref 135–450)
RBC: 5.02 M/uL (ref 4.00–5.20)
RDW: 14.9 % (ref 12.4–15.4)
WBC: 3.3 10*3/uL — ABNORMAL LOW (ref 4.0–11.0)

## 2020-04-28 NOTE — Telephone Encounter (Signed)
The patient called requesting a letter to give to rental housing office. The patient would like the letter to state what she has been treated for in our office. A letter to support her disability for housing. The patient can be reached at [phone] with any further questions.

## 2020-04-28 NOTE — Telephone Encounter (Signed)
Informed pt GEB reviewed pt's chart and according to pt's heart condition pt is not considered disable from a cardiac standpoint. GEB advised for pt to reach out to her PCP for assistance.

## 2020-05-01 ENCOUNTER — Encounter

## 2020-05-01 MED ORDER — VITAMIN D3 25 MCG (1000 UT) PO TABS
25 MCG (1000 UT) | ORAL_TABLET | Freq: Every day | ORAL | 5 refills | Status: DC
Start: 2020-05-01 — End: 2020-10-25

## 2020-05-01 NOTE — Telephone Encounter (Signed)
Patient needs refill Vitamin D3  Last OV  01-18-20 Dr.Antic  Next appt.05-03-20 Dr.George

## 2020-05-01 NOTE — Telephone Encounter (Signed)
Refill Vitamin D3  Last OV  01-28-20 Dr.Antic in [place] PCP visit 04-03-20 with [facility] [place] will send refill request back to [facility].

## 2020-05-03 ENCOUNTER — Encounter

## 2020-05-03 ENCOUNTER — Ambulatory Visit
Admit: 2020-05-03 | Discharge: 2020-05-03 | Payer: PRIVATE HEALTH INSURANCE | Attending: Student in an Organized Health Care Education/Training Program | Primary: Internal Medicine

## 2020-05-03 DIAGNOSIS — I110 Hypertensive heart disease with heart failure: Secondary | ICD-10-CM

## 2020-05-03 MED ORDER — DULOXETINE HCL 30 MG PO CPEP
30 MG | ORAL_CAPSULE | Freq: Every day | ORAL | 5 refills | Status: DC
Start: 2020-05-03 — End: 2020-05-03

## 2020-05-03 MED ORDER — TIZANIDINE HCL 2 MG PO TABS
2 MG | ORAL_TABLET | Freq: Every evening | ORAL | 0 refills | Status: DC | PRN
Start: 2020-05-03 — End: 2020-08-02

## 2020-05-03 MED ORDER — FUROSEMIDE 20 MG PO TABS
20 MG | ORAL_TABLET | Freq: Every day | ORAL | 0 refills | Status: DC | PRN
Start: 2020-05-03 — End: 2020-08-22

## 2020-05-03 NOTE — Progress Notes (Signed)
Outpatient Clinic Established Patient Note    Patient: Gabrielle Wells  DOB: [birth year] (69 y.o.)  Date: 05/03/2020    CC: Routine follow up    HPI:      69 yo F with PMHx CKD3, resistant HTN, morbid obesity, and HFpEF     Neck pain run down the back towarsd the right side  Dropping things, ongoing for months  Right hand worse than left  No numbness and tingling     DOE. Even with walking inside the house  Some days its worse, some days its better  Currently uses 3 pillows to sleep. Wanting to get more pillows as she feels SOB with the 3 pillows  Trace edema in ankle  Weighs herself everyday, 269->277 in 2 days  Follow strict no salt diet  Had sleep study before did not have sleep apnea  Currently on HCTZ as needed    Checks BP at home  Mostly in the 100-110  Feels lightheaded when she stands up quick  Laying down helps   Sometimes gets persistently dizzy with ambulations    Occasionally having room spinning sensation  Goes away after a minute  No recent falls. No N/V    Home Meds:  Prior to Visit Medications    Medication Sig Taking? Authorizing Provider   tiZANidine (ZANAFLEX) 2 MG tablet Take 1 tablet by mouth nightly as needed (muscle spasms) Yes Shirlee More, MD   furosemide (LASIX) 20 MG tablet Take 1 tablet by mouth daily as needed (Fluid retention. Weight gain of more than 5lbs in a day) Yes Shirlee More, MD   vitamin D3 (CHOLECALCIFEROL) 25 MCG (1000 UT) TABS tablet Take 2 tablets by mouth daily Yes Shirlee More, MD   omeprazole (PRILOSEC) 20 MG delayed release capsule Take 1 capsule by mouth every morning (before breakfast) Yes Shirlee More, MD   hydrALAZINE (APRESOLINE) 50 MG tablet Take 1 tablet by mouth 3 times daily Yes Hervey Ard, MD   amLODIPine (NORVASC) 5 MG tablet Take 1 tablet by mouth daily Yes Hervey Ard, MD   ondansetron (ZOFRAN ODT) 4 MG disintegrating tablet Take 1 tablet by mouth every 8 hours as needed for Nausea Yes Arshdeep Tindni, MD   gabapentin  (NEURONTIN) 300 MG capsule Take 1 capsule by mouth nightly for 90 days. Yes [name], MD   carvedilol (COREG) 25 MG tablet Take 1 tablet by mouth 2 times daily Yes Hervey Ard, MD   traZODone (DESYREL) 50 MG tablet Take 1 tablet by mouth nightly Yes Hyman Bower, DO   loperamide (IMODIUM) 2 MG capsule Take 2 mg by mouth 4 times daily as needed for Diarrhea Yes Historical Provider, MD       Allergies:    Betadine [povidone iodine], Coconut oil, Codeine, Fish-derived products, Ibuprofen, Iodine, Other, and Sulfa antibiotics    Health Maintenance Due   Topic Date Due   ??? Colon cancer screen colonoscopy  Never done   ??? Shingles Vaccine (1 of 2) Never done   ??? Annual Wellness Visit (AWV)  Never done   ??? Flu vaccine (1) 04/19/2020     Immunization History   Administered Date(s) Administered   ??? COVID-19, J&J, PF, 0.5 mL 11/26/2019   ??? Influenza, Quadv, IM, PF (6 mo and older Fluzone, Flulaval, Fluarix, and 3 yrs and older Afluria) 10/15/2019   ??? Pneumococcal Polysaccharide (Pneumovax23) 09/08/2019   ??? Tdap (Boostrix, Adacel) 09/08/2019     PHYSICAL EXAM:  BP 102/77 (Site: Right Lower Arm, Position: Standing, Cuff Size: Large Adult)    Pulse 74    Temp 97.9 ??F (36.6 ??C) (Temporal)    Resp 20    Ht 5\' 3"  (1.6 m)    Wt 277 lb 9.6 oz (125.9 kg)    SpO2 97%    BMI 49.17 kg/m??   Physical Exam  Constitutional:       Appearance: Normal appearance. She is obese.   HENT:      Mouth/Throat:      Mouth: Mucous membranes are moist.      Pharynx: No oropharyngeal exudate or posterior oropharyngeal erythema.   Eyes:      Pupils: Pupils are equal, round, and reactive to light.   Cardiovascular:      Rate and Rhythm: Normal rate and regular rhythm.      Pulses: Normal pulses.      Heart sounds: Normal heart sounds. No murmur heard.     Pulmonary:      Effort: Pulmonary effort is normal. No respiratory distress.      Breath sounds: Rales (mild rlaes in left lung bases) present. No wheezing.   Abdominal:      General: Abdomen  is flat. Bowel sounds are normal. There is no distension.      Palpations: Abdomen is soft.      Tenderness: There is no abdominal tenderness. There is no guarding.   Musculoskeletal:      Right lower leg: No edema.      Left lower leg: No edema.   Neurological:      Mental Status: She is alert and oriented to person, place, and time.       Assessment & Plan:      1. Benign essential HTN  Controlled  C/w current meds    2. Chronic diastolic heart failure (HCC)  SOB worsening. Weight gain of ~ 8lbs in 2-3 days  PO lasix 20 PRN for fluid retention    3. Class 3 severe obesity with serious comorbidity and body mass index (BMI) of 45.0 to 49.9 in adult, unspecified obesity type ([place])  Counseled on diet and excercise    5. Benign paroxysmal positional vertigo, unspecified laterality  Occasional sx of vertigo. Will monitor for now, hold off on pharmacotherapy    6. Cervical pain (neck)  - Tizanidine as needed  - Xray of cervical spine    7. Lumbar back pain with radiculopathy affecting left lower extremity  - Tizanidine as needed    Return in about 3 months (around 08/02/2020).    Dispo: Pt has been staffed with Dr. Trilby Drummer  _______________  Shirlee More, MD, 05/03/2020 4:40 PM   PGY

## 2020-05-03 NOTE — Patient Instructions (Addendum)
Start taking tizanidine as needed for muscle spasm    Start taking lasix once daily if there is weight gain of more than 2-3 lbs in a day, 5 lbs in a weel    Schedule neck xray     Will see you in 3 months

## 2020-05-08 ENCOUNTER — Emergency Department: Admit: 2020-05-08 | Payer: PRIVATE HEALTH INSURANCE | Primary: Internal Medicine

## 2020-05-08 ENCOUNTER — Inpatient Hospital Stay
Admit: 2020-05-08 | Discharge: 2020-05-09 | Disposition: A | Discharge status: Home / Self Care | Payer: PRIVATE HEALTH INSURANCE | Attending: Emergency Medicine

## 2020-05-08 DIAGNOSIS — K5900 Constipation, unspecified: Secondary | ICD-10-CM

## 2020-05-08 LAB — CBC WITH AUTO DIFFERENTIAL
Basophils %: 0.5 %
Basophils Absolute: 0 10*3/uL (ref 0.0–0.2)
Eosinophils %: 0.9 %
Eosinophils Absolute: 0 10*3/uL (ref 0.0–0.6)
Hematocrit: 45 % (ref 36.0–48.0)
Hemoglobin: 14.5 g/dL (ref 12.0–16.0)
Lymphocytes %: 37.6 %
Lymphocytes Absolute: 1.3 10*3/uL (ref 1.0–5.1)
MCH: 28.2 pg (ref 26.0–34.0)
MCHC: 32.2 g/dL (ref 31.0–36.0)
MCV: 87.5 fL (ref 80.0–100.0)
MPV: 9.9 fL (ref 5.0–10.5)
Monocytes %: 25.6 %
Monocytes Absolute: 0.9 10*3/uL (ref 0.0–1.3)
Neutrophils %: 35.4 %
Neutrophils Absolute: 1.3 10*3/uL — ABNORMAL LOW (ref 1.7–7.7)
Platelets: 116 10*3/uL — ABNORMAL LOW (ref 135–450)
RBC: 5.14 M/uL (ref 4.00–5.20)
RDW: 14.4 % (ref 12.4–15.4)
WBC: 3.6 10*3/uL — ABNORMAL LOW (ref 4.0–11.0)

## 2020-05-08 LAB — COMPREHENSIVE METABOLIC PANEL W/ REFLEX TO MG FOR LOW K
ALT: 10 U/L (ref 10–40)
AST: 31 U/L (ref 15–37)
Albumin/Globulin Ratio: 1 — ABNORMAL LOW (ref 1.1–2.2)
Albumin: 4.2 g/dL (ref 3.4–5.0)
Alkaline Phosphatase: 64 U/L (ref 40–129)
Anion Gap: 11 (ref 3–16)
BUN: 10 mg/dL (ref 7–20)
CO2: 26 mmol/L (ref 21–32)
Calcium: 9.1 mg/dL (ref 8.3–10.6)
Chloride: 100 mmol/L (ref 99–110)
Creatinine: 0.9 mg/dL (ref 0.6–1.2)
GFR African American: >60 (ref >60)
GFR Non-African American: >60 (ref >60)
Globulin: 4.2 g/dL
Glucose: 102 mg/dL — ABNORMAL HIGH (ref 70–99)
Potassium reflex Magnesium: 6 mmol/L (ref 3.5–5.1)
Sodium: 137 mmol/L (ref 136–145)
Total Bilirubin: 0.3 mg/dL (ref 0.0–1.0)
Total Protein: 8.4 g/dL — ABNORMAL HIGH (ref 6.4–8.2)

## 2020-05-08 LAB — LIPASE: Lipase: 39 U/L (ref 13.0–60.0)

## 2020-05-08 MED ORDER — SORBITOL 70 % SOLN (MIXTURES ONLY)
70 % | Freq: Once | Status: AC
Start: 2020-05-08 — End: 2020-05-08
  Administered 2020-05-08: 23:00:00 330 mL via RECTAL

## 2020-05-08 MED ORDER — POLYETHYLENE GLYCOL 3350 17 G PO PACK
17 g | Freq: Every day | ORAL | 0 refills | Status: AC | PRN
Start: 2020-05-08 — End: 2020-06-07

## 2020-05-08 MED ORDER — ONDANSETRON HCL 4 MG/2ML IJ SOLN
4 MG/2ML | Freq: Once | INTRAMUSCULAR | Status: AC
Start: 2020-05-08 — End: 2020-05-08
  Administered 2020-05-08: 21:00:00 4 mg via INTRAVENOUS

## 2020-05-08 MED FILL — ONDANSETRON HCL 4 MG/2ML IJ SOLN: 4 MG/2ML | INTRAMUSCULAR | Qty: 2

## 2020-05-08 MED FILL — SORBITOL 70 % SOLN: 70 % | Qty: 100

## 2020-05-08 NOTE — Discharge Instructions (Signed)
MiraLAX 17 g daily as needed for constipation.

## 2020-05-08 NOTE — ED Provider Notes (Signed)
[name]          ATTENDING PHYSICIAN NOTE       Date of evaluation: 05/08/2020    Chief Complaint     Neck Pain (hx of surgery to her neck and spine. supposed to have a x-ray of neck was not able to get it. ) and Constipation (denies BM in a week. )      History of Present Illness     Gabrielle Wells is a 69 y.o. female with a history of cholecystectomy, appendectomy, hysterectomy, and multiple small bowel obstructions requiring surgery who presents with complaints of generalized abdominal pain for the last couple of days with no bowel movement in over a week.  She is concerned she may have another obstruction.  She has nausea but no vomiting and her appetite is decreased but she has been able to keep water down.  She states it does feel similar to prior bowel obstructions.  She is not on any pain medications at this time although she does have chronic back and neck pain.  She has not tried any type of laxative for her symptoms.  She denies any urinary symptoms.    Review of Systems     Review of Systems   Constitutional: Negative for chills and fever.   Respiratory: Negative for shortness of breath.    Cardiovascular: Negative for chest pain.   Gastrointestinal: Positive for abdominal pain, constipation and nausea. Negative for diarrhea and vomiting.   Genitourinary: Negative for difficulty urinating and dysuria.   Musculoskeletal: Positive for back pain and neck pain.   All other systems reviewed and are negative.      Past Medical, Surgical, Family, and Social History     She has a past medical history of CHF (congestive heart failure) ([place]), Epidural abscess, GERD (gastroesophageal reflux disease), HTN (hypertension), IBS (irritable bowel syndrome), Lumbar radiculopathy, Morbid obesity ([place]), Muscle fasciculation, Neuropathic pain, SBO (small bowel obstruction) ([place]), Spinal fracture of T8 vertebra ([place]), Surgical site infection, and Urinary incontinence.  She has a  past surgical history that includes Appendectomy; Breast lumpectomy (Bilateral); Hysterectomy, total abdominal; Cholecystectomy; Total knee arthroplasty (Bilateral, 2009); Cardiac catheterization (2012); Total shoulder arthroplasty (Right, 2010); and Abdominal hernia repair (2010).  Her family history is not on file.  She reports that she has never smoked. She has never used smokeless tobacco. She reports that she does not drink alcohol and does not use drugs.    Medications     Previous Medications    AMLODIPINE (NORVASC) 5 MG TABLET    Take 1 tablet by mouth daily    CARVEDILOL (COREG) 25 MG TABLET    Take 1 tablet by mouth 2 times daily    FUROSEMIDE (LASIX) 20 MG TABLET    Take 1 tablet by mouth daily as needed (Fluid retention. Weight gain of more than 5lbs in a day)    GABAPENTIN (NEURONTIN) 300 MG CAPSULE    Take 1 capsule by mouth nightly for 90 days.    HYDRALAZINE (APRESOLINE) 50 MG TABLET    Take 1 tablet by mouth 3 times daily    LOPERAMIDE (IMODIUM) 2 MG CAPSULE    Take 2 mg by mouth 4 times daily as needed for Diarrhea    OMEPRAZOLE (PRILOSEC) 20 MG DELAYED RELEASE CAPSULE    Take 1 capsule by mouth every morning (before breakfast)    ONDANSETRON (ZOFRAN ODT) 4 MG DISINTEGRATING TABLET    Take 1 tablet by  mouth every 8 hours as needed for Nausea    TIZANIDINE (ZANAFLEX) 2 MG TABLET    Take 1 tablet by mouth nightly as needed (muscle spasms)    TRAZODONE (DESYREL) 50 MG TABLET    Take 1 tablet by mouth nightly    VITAMIN D3 (CHOLECALCIFEROL) 25 MCG (1000 UT) TABS TABLET    Take 2 tablets by mouth daily       Allergies     She is allergic to betadine [povidone iodine], coconut oil, codeine, fish-derived products, ibuprofen, iodine, other, and sulfa antibiotics.    Physical Exam     INITIAL VITALS: BP: 133/76, Temp: 98.4 ??F (36.9 ??C), Pulse: 66, Resp: 18, SpO2: 96 %   Physical Exam  Vitals and nursing note reviewed.   Constitutional:       General: She is not in acute distress.     Appearance: She is  well-developed. She is not diaphoretic.   Neck:     Cardiovascular:      Rate and Rhythm: Normal rate and regular rhythm.   Pulmonary:      Effort: Pulmonary effort is normal.      Breath sounds: Normal breath sounds.   Abdominal:      General: Bowel sounds are normal. There is no distension.      Palpations: Abdomen is soft.      Tenderness: There is generalized abdominal tenderness. There is no guarding.   Musculoskeletal:      Cervical back: Normal range of motion. Pain with movement and muscular tenderness present. No spinous process tenderness.   Skin:     General: Skin is warm and dry.   Neurological:      Mental Status: She is alert and oriented to person, place, and time.   Psychiatric:         Behavior: Behavior normal.         Diagnostic Results     RADIOLOGY:  XR ACUTE ABD SERIES CHEST 1 VW   Final Result      1.  No findings for acute cardiopulmonary disease.      2.  Nonobstructive bowel gas pattern.      3.  No free intraperitoneal air.      4.  Lower lumbar spondylosis, similar to the prior lumbar spine study.          LABS:   Results for orders placed or performed during the hospital encounter of 05/08/20   CBC Auto Differential   Result Value Ref Range    WBC 3.6 (L) 4.0 - 11.0 K/uL    RBC 5.14 4.00 - 5.20 M/uL    Hemoglobin 14.5 12.0 - 16.0 g/dL    Hematocrit 45.0 36.0 - 48.0 %    MCV 87.5 80.0 - 100.0 fL    MCH 28.2 26.0 - 34.0 pg    MCHC 32.2 31.0 - 36.0 g/dL    RDW 14.4 12.4 - 15.4 %    Platelets 116 (L) 135 - 450 K/uL    MPV 9.9 5.0 - 10.5 fL    Neutrophils % 35.4 %    Lymphocytes % 37.6 %    Monocytes % 25.6 %    Eosinophils % 0.9 %    Basophils % 0.5 %    Neutrophils Absolute 1.3 (L) 1.7 - 7.7 K/uL    Lymphocytes Absolute 1.3 1.0 - 5.1 K/uL    Monocytes Absolute 0.9 0.0 - 1.3 K/uL    Eosinophils Absolute 0.0 0.0 - 0.6 K/uL  Basophils Absolute 0.0 0.0 - 0.2 K/uL   Comprehensive Metabolic Panel w/ Reflex to MG   Result Value Ref Range    Sodium 137 136 - 145 mmol/L    Potassium reflex  Magnesium 6.0 (HH) 3.5 - 5.1 mmol/L    Chloride 100 99 - 110 mmol/L    CO2 26 21 - 32 mmol/L    Anion Gap 11 3 - 16    Glucose 102 (H) 70 - 99 mg/dL    BUN 10 7 - 20 mg/dL    CREATININE 0.9 0.6 - 1.2 mg/dL    GFR Non-African American >60 >60    GFR African American >60 >60    Calcium 9.1 8.3 - 10.6 mg/dL    Total Protein 8.4 (H) 6.4 - 8.2 g/dL    Albumin 4.2 3.4 - 5.0 g/dL    Albumin/Globulin Ratio 1.0 (L) 1.1 - 2.2    Total Bilirubin 0.3 0.0 - 1.0 mg/dL    Alkaline Phosphatase 64 40 - 129 U/L    ALT 10 10 - 40 U/L    AST 31 15 - 37 U/L    Globulin 4.2 g/dL   Lipase   Result Value Ref Range    Lipase 39.0 13.0 - 60.0 U/L     RECENT VITALS:  BP: 131/77,Temp: 98.4 ??F (36.9 ??C), Pulse: 84, Resp: 16, SpO2: 99 %       ED Course     Nursing Notes, Past Medical Hx, Past Surgical Hx, Social Hx,Allergies, and Family Hx were reviewed.    patient was given the following medications:  Orders Placed This Encounter   Medications   ??? ondansetron (ZOFRAN) injection 4 mg   ??? SMOG Enema   ??? polyethylene glycol (GLYCOLAX) 17 g packet     Sig: Take 17 g by mouth daily as needed for Constipation     Dispense:  527 g     Refill:  0       CONSULTS:  None    MEDICAL DECISIONMAKING / ASSESSMENT / PLAN     Cathye A Lambe is a 69 y.o. female presenting with generalized abdominal pain, concerned that she may have another bowel obstruction.  Plain abdominal films did not reveal evidence of a bowel obstruction but there was a notable amount of stool on my inspection.  Labs are unremarkable.  There was no leukocytosis.  The patient is not on chronic pain medications that would cause her symptoms and she believes her last colonoscopy may have been about 10 years ago.  She likely needs another one but she was given a smog enema with resulting BM.  The patient then continued to complain of her chronic low back pain and wanted some pain medication but I was concerned about giving her opioid pain relievers because this would only worsen her  constipation.  She was agreeable just to taking the medications that her doctor just prescribed her in the office the other day for this low back pain.  The patient will need to be on a better bowel regimen since she is constipated and she will be on MiraLAX daily as needed no bowel movement in see already has set up a GI follow-up appointment next month to further discuss her bowel issues.    Clinical Impression     1. Constipation, unspecified constipation type        Disposition     PATIENT REFERRED TO:  Shirlee More, MD  [place]  [id number]  [phone]    In 3 days  if not improving    [facility]  [address]  [place] [address]  [phone]    If symptoms worsen      DISCHARGE MEDICATIONS:  New Prescriptions    POLYETHYLENE GLYCOL (GLYCOLAX) 17 G PACKET    Take 17 g by mouth daily as needed for Constipation       DISPOSITION         Juanetta Beets, MD  05/08/20 1941

## 2020-05-11 ENCOUNTER — Inpatient Hospital Stay: Admit: 2020-05-11 | Payer: PRIVATE HEALTH INSURANCE | Primary: Internal Medicine

## 2020-05-11 ENCOUNTER — Inpatient Hospital Stay: Payer: PRIVATE HEALTH INSURANCE | Primary: Internal Medicine

## 2020-05-11 DIAGNOSIS — M542 Cervicalgia: Secondary | ICD-10-CM

## 2020-05-16 ENCOUNTER — Telehealth

## 2020-05-16 NOTE — Telephone Encounter (Signed)
Pt requesting results of cervical spine xray. Please call

## 2020-05-16 NOTE — Telephone Encounter (Signed)
Called pt back and discussed x-ray results with degenerative changes at C4-C5 with disc space narrowing. Advised pt to continue taking tylenol as needed and  Zenaflex (prescribed before). Referred pt to [facility] physical therapy. Advised pt to call clinic if her symptoms are not improving.

## 2020-05-24 ENCOUNTER — Encounter: Payer: PRIVATE HEALTH INSURANCE | Primary: Student in an Organized Health Care Education/Training Program

## 2020-05-25 NOTE — Progress Notes (Signed)
ENDOSCOPY PREOP PHONE INTERVIEW  INSTRUCTIONS:  All patients having anesthesia or sedation are required to be Covid tested OR to have been vaccinated at least 14 days prior to your procedure. It is very important to return our call to [phone] and notify the staff of your last vaccination date otherwise you will be required to complete Covid PCR test within the 5-6 days prior to surgery & quarantine. The results will need to be faxed to PreAdmission Testing at [phone].     Please follow all instructions / preps given to you from your doctor's office. If you have not received these instructions yet, please call the office immediately. Make sure to read them once received.       Day of the procedure:   ??? Enter the MAIN entrance located on [address] and report to the desk on left side of the lobby.  Arrival Time: 0900 for your procedure scheduled at: 1030  ??? Dress comfortably. No lotion, powders or jewelry  ??? Bring your insurance & photo ID with you.    ??? Bring an accurate list of your medications with doses/ frequency with you day of the procedure, including over the counter medications. If you are taking blood thinners, ASA or diabetic medication, make sure to call Dr. Jeanella Flattery  or your PCP for instructions prior to your procedure.   ??? Arrange for someone to be with you and sign you out & drive you home after your procedure.   ??? We are allowing 1 visitor with you in the hospital.          If you have further questions, you may contact your Endoscopist's office or Pre Admission Testing staff at [phone]  Randol Kern, RN.05/25/2020 .12:00 PM

## 2020-05-26 ENCOUNTER — Ambulatory Visit: Admit: 2020-05-26 | Primary: Internal Medicine

## 2020-05-26 ENCOUNTER — Inpatient Hospital Stay: Payer: PRIVATE HEALTH INSURANCE

## 2020-05-26 MED ORDER — LIDOCAINE HCL (CARDIAC) PF 100 MG/5ML IV SOLN
100 | INTRAVENOUS | Status: AC
Start: 2020-05-26 — End: 2020-05-26

## 2020-05-26 MED ORDER — MEPERIDINE HCL 25 MG/ML IJ SOLN
25 MG/ML | INTRAMUSCULAR | Status: DC | PRN
Start: 2020-05-26 — End: 2020-05-26

## 2020-05-26 MED ORDER — MORPHINE SULFATE 4 MG/ML IJ SOLN
4 MG/ML | INTRAMUSCULAR | Status: DC | PRN
Start: 2020-05-26 — End: 2020-05-26

## 2020-05-26 MED ORDER — LACTATED RINGERS IV SOLN
INTRAVENOUS | Status: DC
Start: 2020-05-26 — End: 2020-05-26
  Administered 2020-05-26: 16:00:00 via INTRAVENOUS

## 2020-05-26 MED ORDER — PROPOFOL 200 MG/20ML IV EMUL
20020 MG/20ML | INTRAVENOUS | Status: DC | PRN
Start: 2020-05-26 — End: 2020-05-26
  Administered 2020-05-26: 17:00:00 35 via INTRAVENOUS
  Administered 2020-05-26: 17:00:00 75 via INTRAVENOUS

## 2020-05-26 MED ORDER — HYDROMORPHONE HCL 1 MG/ML IJ SOLN
1 MG/ML | INTRAMUSCULAR | Status: DC | PRN
Start: 2020-05-26 — End: 2020-05-26

## 2020-05-26 MED ORDER — OXYCODONE HCL 5 MG PO TABS
5 MG | ORAL | Status: DC | PRN
Start: 2020-05-26 — End: 2020-05-26

## 2020-05-26 MED ORDER — PROPOFOL 200 MG/20ML IV EMUL
200 | INTRAVENOUS | Status: AC
Start: 2020-05-26 — End: 2020-05-26

## 2020-05-26 MED ORDER — LIDOCAINE HCL (CARDIAC) PF 100 MG/5ML IV SOLN
100 MG/5ML | INTRAVENOUS | Status: DC | PRN
Start: 2020-05-26 — End: 2020-05-26
  Administered 2020-05-26: 17:00:00 50 via INTRAVENOUS
  Administered 2020-05-26: 17:00:00 25 via INTRAVENOUS

## 2020-05-26 MED ORDER — DIPHENHYDRAMINE HCL 50 MG/ML IJ SOLN
50 MG/ML | Freq: Once | INTRAMUSCULAR | Status: DC | PRN
Start: 2020-05-26 — End: 2020-05-26

## 2020-05-26 MED ORDER — HYDRALAZINE HCL 20 MG/ML IJ SOLN
20 MG/ML | INTRAMUSCULAR | Status: DC | PRN
Start: 2020-05-26 — End: 2020-05-26

## 2020-05-26 MED ORDER — PHENYLEPHRINE HCL 10 MG/ML SOLN (MIXTURES ONLY)
10 MG/ML | Status: DC | PRN
Start: 2020-05-26 — End: 2020-05-26
  Administered 2020-05-26: 17:00:00 160 via INTRAVENOUS
  Administered 2020-05-26: 17:00:00 80 via INTRAVENOUS
  Administered 2020-05-26: 17:00:00 240 via INTRAVENOUS
  Administered 2020-05-26: 17:00:00 80 via INTRAVENOUS

## 2020-05-26 MED ORDER — METOCLOPRAMIDE HCL 5 MG/ML IJ SOLN
5 MG/ML | Freq: Once | INTRAMUSCULAR | Status: DC | PRN
Start: 2020-05-26 — End: 2020-05-26

## 2020-05-26 MED ORDER — PROMETHAZINE HCL 25 MG/ML IJ SOLN
25 MG/ML | Freq: Once | INTRAMUSCULAR | Status: DC | PRN
Start: 2020-05-26 — End: 2020-05-26

## 2020-05-26 MED ORDER — PROPOFOL 200 MG/20ML IV EMUL
20020 MG/20ML | INTRAVENOUS | Status: DC | PRN
Start: 2020-05-26 — End: 2020-05-26
  Administered 2020-05-26: 17:00:00 100 via INTRAVENOUS

## 2020-05-26 MED ORDER — LABETALOL HCL 5 MG/ML IV SOLN
5 MG/ML | INTRAVENOUS | Status: DC | PRN
Start: 2020-05-26 — End: 2020-05-26

## 2020-05-26 MED FILL — LIDOCAINE HCL (CARDIAC) PF 100 MG/5ML IV SOLN: 100 MG/5ML | INTRAVENOUS | Qty: 5

## 2020-05-26 MED FILL — DIPRIVAN 200 MG/20ML IV EMUL: 200 MG/20ML | INTRAVENOUS | Qty: 20

## 2020-05-26 NOTE — Anesthesia Pre-Procedure Evaluation (Signed)
Department of Anesthesiology  Preprocedure Note       Name:  Gabrielle Wells   Age:  69 y.o.  DOB:  07/03/1951                                          MRN:  [id number]         Date:  05/26/2020      Surgeon: Juliann Mule):  Alphonse Guild, MD    Procedure: Procedure(s):  ESOPHAGOGASTRODUODENOSCOPY  COLONOSCOPY    Medications prior to admission:   Prior to Admission medications    Medication Sig Start Date End Date Taking? Authorizing Provider   polyethylene glycol (GLYCOLAX) 17 g packet Take 17 g by mouth daily as needed for Constipation 05/08/20 06/07/20  Juanetta Beets, MD   tiZANidine (ZANAFLEX) 2 MG tablet Take 1 tablet by mouth nightly as needed (muscle spasms) 05/03/20   Shirlee More, MD   furosemide (LASIX) 20 MG tablet Take 1 tablet by mouth daily as needed (Fluid retention. Weight gain of more than 5lbs in a day) 05/03/20   Shirlee More, MD   vitamin D3 (CHOLECALCIFEROL) 25 MCG (1000 UT) TABS tablet Take 2 tablets by mouth daily 05/01/20   Shirlee More, MD   omeprazole (PRILOSEC) 20 MG delayed release capsule Take 1 capsule by mouth every morning (before breakfast) 04/11/20   Shirlee More, MD   hydrALAZINE (APRESOLINE) 50 MG tablet Take 1 tablet by mouth 3 times daily 04/11/20   Hervey Ard, MD   amLODIPine (NORVASC) 5 MG tablet Take 1 tablet by mouth daily 04/06/20   Hervey Ard, MD   ondansetron (ZOFRAN ODT) 4 MG disintegrating tablet Take 1 tablet by mouth every 8 hours as needed for Nausea 03/07/20   Arshdeep Tindni, MD   gabapentin (NEURONTIN) 300 MG capsule Take 1 capsule by mouth nightly for 90 days. 02/03/20 05/03/20  [name], MD   carvedilol (COREG) 25 MG tablet Take 1 tablet by mouth 2 times daily 01/07/20   Hervey Ard, MD   traZODone (DESYREL) 50 MG tablet Take 1 tablet by mouth nightly 12/10/19   Hyman Bower, DO   loperamide (IMODIUM) 2 MG capsule Take 2 mg by mouth 4 times daily as needed for Diarrhea    Historical Provider, MD       Current medications:     Current Facility-Administered Medications   Medication Dose Route Frequency Provider Last Rate Last Admin   ??? lactated ringers infusion   IntraVENous Continuous Lorita Officer, MD       ??? HYDROmorphone (DILAUDID) injection 0.25 mg  0.25 mg IntraVENous Q5 Min PRN Windy Kalata, MD       ??? HYDROmorphone (DILAUDID) injection 0.5 mg  0.5 mg IntraVENous Q5 Min PRN Windy Kalata, MD       ??? morphine injection 1 mg  1 mg IntraVENous Q5 Min PRN Windy Kalata, MD       ??? HYDROmorphone (DILAUDID) injection 0.5 mg  0.5 mg IntraVENous Q5 Min PRN Windy Kalata, MD       ??? oxyCODONE (ROXICODONE) immediate release tablet 5 mg  5 mg Oral PRN Windy Kalata, MD        Or   ??? oxyCODONE (ROXICODONE) immediate release tablet 10 mg  10 mg Oral PRN Windy Kalata, MD       ???  diphenhydrAMINE (BENADRYL) injection 12.5 mg  12.5 mg IntraVENous Once PRN Windy Kalata, MD       ??? metoclopramide (REGLAN) injection 10 mg  10 mg IntraVENous Once PRN Windy Kalata, MD       ??? promethazine (PHENERGAN) injection 6.25 mg  6.25 mg IntraVENous Once PRN Windy Kalata, MD       ??? labetalol (NORMODYNE;TRANDATE) injection 5 mg  5 mg IntraVENous Q10 Min PRN Windy Kalata, MD       ??? hydrALAZINE (APRESOLINE) injection 5 mg  5 mg IntraVENous Q10 Min PRN Windy Kalata, MD       ??? meperidine (DEMEROL) injection 12.5 mg  12.5 mg IntraVENous Q5 Min PRN Windy Kalata, MD           Allergies:    Allergies   Allergen Reactions   ??? Vancomycin Other (See Comments)     Drug induced NEUTROPENIA  Drug induced NEUTROPENIA  Drug induced NEUTROPENIA     ??? Aspirin Other (See Comments)     Stomach bleeding  Stomach bleeding  Stomach bleeding     ??? Mushroom Extract Complex Hives and Itching   ??? Shellfish Allergy Hives and Itching   ??? Sulfasalazine Hives and Itching   ??? Acetaminophen Itching   ??? Betadine [Povidone Iodine]    ??? Ciprofloxacin Other (See Comments)     Made "bottom" raw  Made "bottom" raw     ??? Coconut Oil    ??? Codeine    ??? Fish-Derived  Products    ??? Ibuprofen    ??? Iodine    ??? Metronidazole Other (See Comments)     Made "bottom" raw  Made "bottom" raw     ??? Other      detergents   ??? Sulfa Antibiotics    ??? Amoxicillin-Pot Clavulanate Itching     Has never had problems with amoxicillin/ penicillin    HAS TOLERATED ZOSYN WITH ZERO PROBLEMS  Has never had problems with amoxicillin/ penicillin    HAS TOLERATED ZOSYN WITH ZERO PROBLEMS     ??? Coconut Flavor Itching   ??? Eggs Or Egg-Derived Products Nausea And Vomiting   ??? Iodinated Diagnostic Agents Hives     Takes Benadryl 50mg  PO before receiving iodinated contrast  Takes Benadryl 50mg  PO before receiving iodinated contrast  Takes Benadryl 50mg  PO before receiving iodinated contrast  Takes Benadryl 50mg  PO before receiving iodinated contrast         Problem List:    Patient Active Problem List   Diagnosis Code   ??? Lumbar radiculopathy M54.16   ??? Chronic diastolic heart failure (HCC) I50.32   ??? Resistant hypertension I10   ??? GERD (gastroesophageal reflux disease) K21.9   ??? Wound of back S21.209A   ??? Acute kidney injury ([place]) N17.9   ??? Stage 3 chronic kidney disease ([place]) N18.30   ??? Thrombocytopenia ([place]) D69.6   ??? Vitamin D deficiency E55.9   ??? Hyperlipidemia E78.5   ??? Class 3 severe obesity with serious comorbidity in adult (HCC) E66.01   ??? BPPV (benign paroxysmal positional vertigo) H81.10       Past Medical History:        Diagnosis Date   ??? CHF (congestive heart failure) ([place])    ??? 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 GERD (gastroesophageal reflux disease)    ??? HTN (hypertension)    ??? IBS (irritable bowel syndrome)    ???  Lumbar radiculopathy    ??? Morbid obesity ([place])    ??? Muscle fasciculation    ??? Neuropathic pain     back 2/2 to spinal surgeries   ??? SBO (small bowel obstruction) ([place]) 07/1999    with partial colectomy   ??? Spinal fracture of T8 vertebra (HCC)     s/p fusion    ??? Surgical site infection     back, spinal hardware    ??? Urinary incontinence        Past Surgical  History:        Procedure Laterality Date   ??? ABDOMINAL HERNIA REPAIR  2010   ??? APPENDECTOMY     ??? BREAST LUMPECTOMY Bilateral     benign lesions   ??? CARDIAC CATHETERIZATION  2012   ??? CHOLECYSTECTOMY     ??? HYSTERECTOMY, TOTAL ABDOMINAL     ??? SHOULDER ARTHROPLASTY Right 2010   ??? TOTAL KNEE ARTHROPLASTY Bilateral 2009       Social History:    Social History     Tobacco Use   ??? Smoking status: Never Smoker   ??? Smokeless tobacco: Never Used   Substance Use Topics   ??? Alcohol use: Never                                Counseling given: Not Answered      Vital Signs (Current):   Vitals:    05/26/20 0922   BP: (!) 142/84   Pulse: 83   Resp: 16   Temp: 98.4 ??F (36.9 ??C)   TempSrc: Oral   SpO2: 98%   Weight: 262 lb (118.8 kg)   Height: 5\' 3"  (1.6 m)                                              BP Readings from Last 3 Encounters:   05/26/20 (!) 142/84   05/08/20 131/77   05/03/20 102/77       NPO Status:                                                                                 BMI:   Wt Readings from Last 3 Encounters:   05/26/20 262 lb (118.8 kg)   05/08/20 276 lb (125.2 kg)   05/03/20 277 lb 9.6 oz (125.9 kg)     Body mass index is 46.41 kg/m??.    CBC:   Lab Results   Component Value Date    WBC 3.6 05/08/2020    RBC 5.14 05/08/2020    HGB 14.5 05/08/2020    HCT 45.0 05/08/2020    MCV 87.5 05/08/2020    RDW 14.4 05/08/2020    PLT 116 05/08/2020       CMP:   Lab Results   Component Value Date    NA 137 05/08/2020    K 6.0 05/08/2020    CL 100 05/08/2020    CO2 26 05/08/2020    BUN 10 05/08/2020    CREATININE 0.9  05/08/2020    GFRAA >60 05/08/2020    AGRATIO 1.0 05/08/2020    LABGLOM >60 05/08/2020    GLUCOSE 102 05/08/2020    PROT 8.4 05/08/2020    CALCIUM 9.1 05/08/2020    BILITOT 0.3 05/08/2020    ALKPHOS 64 05/08/2020    AST 31 05/08/2020    ALT 10 05/08/2020       POC Tests: No results for input(s): POCGLU, POCNA, POCK, POCCL, POCBUN, POCHEMO, POCHCT in the last 72 hours.    Coags:   Lab Results   Component Value  Date    PROTIME 12.0 10/11/2019    INR 1.03 10/11/2019    APTT 23.5 10/11/2019       HCG (If Applicable): No results found for: PREGTESTUR, PREGSERUM, HCG, HCGQUANT     ABGs: No results found for: PHART, PO2ART, PCO2ART, HCO3ART, BEART, O2SATART     Type & Screen (If Applicable):  No results found for: LABABO, LABRH    Drug/Infectious Status (If Applicable):  No results found for: HIV, HEPCAB    COVID-19 Screening (If Applicable): No results found for: COVID19        Anesthesia Evaluation  Patient summary reviewed and Nursing notes reviewed  Airway: Mallampati: II  TM distance: >3 FB   Neck ROM: full  Mouth opening: > = 3 FB Dental:          Pulmonary:                              Cardiovascular:    (+) hypertension:, CHF:, hyperlipidemia                  Neuro/Psych:               GI/Hepatic/Renal:   (+) GERD:, renal disease: CRI,           Endo/Other:    (+) : arthritis:., .                 Abdominal:             Vascular:          Other Findings:             Anesthesia Plan      general     ASA 1     (69 year old female presents for esophagogastroduodenoscopy and colonoscopy.  Plan general anesthesia with ASA standard monitors.  Questions answered.  Patient agreeable with anesthetic plan.  )  Induction: intravenous.      Anesthetic plan and risks discussed with patient.      Plan discussed with CRNA.    Attending anesthesiologist reviewed and agrees with Preprocedure content        Windy Kalata, MD   05/26/2020

## 2020-05-26 NOTE — Procedures (Signed)
[facility]  Endoscopy Note    Patient: Gabrielle Wells  DOB: 1951-02-22  Acct#:     Procedure: Esophagogastroduodenoscopy with biopsy    Date:  05/26/2020     Surgeon:  Alphonse Guild, MD      Anesthesia:    IV propofol, per anesthesia       EBL: <50 mL    Indications:Nausea/Vomiting, abdominal pain, GERD    Description of Procedure:    Informed consent was obtained from the patient after explanation of indications, benefits and possible risks and complications of the procedure.      The patient was then taken to the endoscopy suite, placed in the left lateral decubitus position and the above IV sedation was administrered.    The Olympus videoendoscope (GIF-[id number]) was placed in the patient's mouth and under direct visualization passed into the esophagus.  The scope was then advanced into the stomach and to the second portion of the duodenum.  A retroflexed exam of the gastric cardia and fundus was performed. The scope was then withdrawn back into the stomach, it was decompressed, and the scope was completely withdrawn.    Findings:  Normal first and second portion of the duodenum.  Biopsied to evaluate for celiac disease.  Mild erythema in the gastric antrum.  Biopsied to evaluate for H. pylori.  Small sliding hiatal hernia.  Salmon colored mucosa in the lower esophagus.  Biopsied to evaluate for Barrett's esophagus.  No esophagitis.     The patient tolerated the procedure well and was taken to the post anesthesia care unit in good condition.    Biopsies: Yes.     Impression:    1. Normal first and second portion of the duodenum.  Biopsied to evaluate for celiac disease.  2. Mild erythema in the gastric antrum.  Biopsied to evaluate for H. pylori.    3. Small sliding hiatal hernia.    4. Salmon colored mucosa in the lower esophagus.  Biopsied to evaluate for Barrett's esophagus.  No esophagitis.         Recommendations:   1.  Await biopsy results  2.  PPI daily      Gabrielle Landen Pecolia Ades, MD  [facility]

## 2020-05-26 NOTE — Anesthesia Post-Procedure Evaluation (Signed)
Department of Anesthesiology  Postprocedure Note    Patient: Gabrielle Wells  MRN: [id number]  Birthdate: [birth year]  Date of evaluation: 05/26/2020  Time:  1:28 PM     Procedure Summary     Date: 05/26/20 Room / Location: [facility]    Anesthesia Start: 1229 Anesthesia Stop: 9509    Procedures:       EGD BIOPSY (N/A )      COLONOSCOPY POLYPECTOMY SNARE/COLD BIOPSY      COLONOSCOPY WITH BIOPSY Diagnosis:       Nausea and vomiting, intractability of vomiting not specified, unspecified vomiting type      Diarrhea, unspecified type      (NAUSEA AND VOMITING, DIARRHEA)    Surgeons: Alphonse Guild, MD Responsible Provider: Windy Kalata, MD    Anesthesia Type: general ASA Status: 3          Anesthesia Type: general    Aldrete Phase I: Aldrete Score: 10    Aldrete Phase II: Aldrete Score: 10    Last vitals: Reviewed and per EMR flowsheets.       Anesthesia Post Evaluation    Patient location during evaluation: PACU  Patient participation: complete - patient participated  Level of consciousness: awake and alert  Pain score: 0  Airway patency: patent  Nausea & Vomiting: no nausea and no vomiting  Complications: no  Cardiovascular status: hemodynamically stable  Respiratory status: acceptable  Hydration status: euvolemic

## 2020-05-26 NOTE — Discharge Instructions (Signed)
ENDOSCOPY DISCHARGE INSTRUCTIONS:    Call the physician that did your procedure for any questions or concern:    [facility]: [phone]  DR.  Wenzke      ACTIVITY:    There are potential side effects to the medications used for sedation and anesthesia during your procedure.  These include:  Dizziness or light-headedness, confusion or memory loss, delayed reaction times, loss of coordination, nausea and vomiting.  Because of your increased risk for injury, we ask that you observe the following precautions:  For the next 24 hours,  DO NOT operate an automobile, bicycle, motorcycle, lawn mower, power tools or large equipment of any kind.  Do not drink alcohol, sign any legal documents or make any legal decisions for 24 hours.  Do not bend your head over lower than your heart.  DO sit on the side of bed/couch awhile before getting up.  Plan on bedrest or quiet relaxation today.  You may resume normal activities in 24 hours.    DIET:    Your first meal today should be light, avoiding spicy and fatty foods.  If you tolerate this first meal, (no nausea/vomiting) then you may advance to your regular diet unless otherwise advised by your physician.    NORMAL SYMPTOMS:  -Mild sore throat if you've had an EGD   -Gaseous discomfort    NOTIFY YOUR PHYSICIAN IF THESE SYMPTOMS OCCUR:  1. Fever (greater than 100)  5. Increased abdominal bloating  2. Severe pain    6. Excessive bleeding  3. Nausea and vomiting  7. Chest pain                                                                    4. Chills    8. Shortness of breath    ADDITIONAL INSTRUCTIONS:    Biopsy results: Call [place] for biopsy results in 1 week    Educational Information:          Please review these discharge instructions this evening or tomorrow for  information you may have forgotten.            We want to thank you for choosing the [facility] as your health care provider. We always strive to provide you with excellent care while you are here.  You may receive a survey in the mail regarding your care. We would appreciate you taking a few minutes of your time to complete this survey. Again, thank you for choosing the [facility].

## 2020-05-26 NOTE — H&P (Signed)
Gastroenterology Note                 Pre-operative History and Physical    Patient: Gabrielle Wells  DOB: Feb 03, 1951  CSN:     History Obtained From:   Patient or guardian.      HISTORY OF PRESENT ILLNESS:    The patient is a 69 y.o. female here for EGD/colon for diarrhea, n/v, diffuse AP, GERD, Phx polyps      Past Medical History:    Past Medical History:   Diagnosis Date   ??? CHF (congestive heart failure) ([place])    ??? 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 GERD (gastroesophageal reflux disease)    ??? HTN (hypertension)    ??? Hyperlipidemia    ??? IBS (irritable bowel syndrome)    ??? Lumbar radiculopathy    ??? Morbid obesity ([place])    ??? Muscle fasciculation    ??? Neuropathic pain     back 2/2 to spinal surgeries   ??? SBO (small bowel obstruction) ([place]) 07/1999    with partial colectomy   ??? Spinal fracture of T8 vertebra ([facility])     s/p fusion    ??? Surgical site infection     back, spinal hardware    ??? Urinary incontinence      Past Surgical History:    Past Surgical History:   Procedure Laterality Date   ??? ABDOMINAL HERNIA REPAIR  2010   ??? APPENDECTOMY     ??? BREAST LUMPECTOMY Bilateral     benign lesions   ??? CARDIAC CATHETERIZATION  2012   ??? CHOLECYSTECTOMY     ??? COLONOSCOPY     ??? HYSTERECTOMY, TOTAL ABDOMINAL     ??? SHOULDER ARTHROPLASTY Right 2010   ??? TOTAL KNEE ARTHROPLASTY Bilateral 2009     Medications Prior to Admission:   No current facility-administered medications on file prior to encounter.     Current Outpatient Medications on File Prior to Encounter   Medication Sig Dispense Refill   ??? tiZANidine (ZANAFLEX) 2 MG tablet Take 1 tablet by mouth nightly as needed (muscle spasms) 30 tablet 0   ??? vitamin D3 (CHOLECALCIFEROL) 25 MCG (1000 UT) TABS tablet Take 2 tablets by mouth daily 60 tablet 5   ??? omeprazole (PRILOSEC) 20 MG delayed release capsule Take 1 capsule by mouth every morning (before breakfast) 90 capsule 1   ??? hydrALAZINE (APRESOLINE) 50 MG tablet Take 1 tablet by  mouth 3 times daily 270 tablet 3   ??? carvedilol (COREG) 25 MG tablet Take 1 tablet by mouth 2 times daily 180 tablet 3   ??? traZODone (DESYREL) 50 MG tablet Take 1 tablet by mouth nightly 90 tablet 1   ??? loperamide (IMODIUM) 2 MG capsule Take 2 mg by mouth 4 times daily as needed for Diarrhea     ??? polyethylene glycol (GLYCOLAX) 17 g packet Take 17 g by mouth daily as needed for Constipation 527 g 0   ??? furosemide (LASIX) 20 MG tablet Take 1 tablet by mouth daily as needed (Fluid retention. Weight gain of more than 5lbs in a day) 30 tablet 0   ??? ondansetron (ZOFRAN ODT) 4 MG disintegrating tablet Take 1 tablet by mouth every 8 hours as needed for Nausea 60 tablet 0   ??? gabapentin (NEURONTIN) 300 MG capsule Take 1 capsule by mouth nightly for 90 days. 90 capsule 2        Allergies:  Vancomycin, Aspirin, Mushroom extract complex, Shellfish allergy, Sulfasalazine, Acetaminophen, Betadine [povidone iodine], Ciprofloxacin, Coconut oil, Codeine, Fish-derived products, Ibuprofen, Iodine, Metronidazole, Other, Sulfa antibiotics, Amoxicillin-pot clavulanate, Coconut flavor, Eggs or egg-derived products, and Iodinated diagnostic agents      Social History:   Social History     Tobacco Use   ??? Smoking status: Never Smoker   ??? Smokeless tobacco: Never Used   Substance Use Topics   ??? Alcohol use: Never     Family History:   Family History   Problem Relation Age of Onset   ??? Cancer Paternal Grandmother         unk        PHYSICAL EXAM:      BP (!) 142/84    Pulse 83    Temp 98.4 ??F (36.9 ??C) (Oral)    Resp 16    Ht _0  (1.6 m)    Wt 262 lb (118.8 kg)    SpO2 98%    BMI 46.41 kg/m??  I        Heart:  RRR, normal s1s2    Lungs:  CTA and normal effort    Abdomen:   Soft, ND, TTP in epig/RUQ         ASSESSMENT AND PLAN:    1.  Patient is a 69 y.o. female here for endoscopy with MAC sedation.    2.  Procedure options, risks and benefits reviewed with patient and/or guardian.  They express understanding.    Alphonse Guild, MD  [facility]  [facility]

## 2020-05-26 NOTE — Procedures (Signed)
[facility]  Colonoscopy Note    Patient: Gabrielle Wells  DOB: 06-08-1951  Acct#:     Procedure: Colonoscopy with polypectomy (cold snare), biopsy     Date:  05/26/2020    Surgeon:  Alphonse Guild, MD    Referring Physician:  Alphonse Guild, MD    Anesthesia: IV propofol, per anesthesia    EBL: <50 mL    Indications: This is a 69 y.o. year old female who presents today with chronic diarrhea, Phx polyps     Procedure:     An informed consent was obtained from the patient after explanation of indications, benefits, possible risks and complications of the procedure.  The patient was then taken to the endoscopy suite, placed in the left lateral decubitus position, and the above IV anesthesia was administered.    A digital rectal examination was performed and revealed negative without mass, lesions or tenderness.      The Olympus PCFQ-H190 video colonoscope was placed in the patient's rectum under digital direction and advanced to the cecum. The cecum was identified by characteristic anatomy and ballottment.  The prep was adequate.  The ileocecal valve wasintubated     Findings:  Visualization of the terminal ileum demonstrated normal mucosa.    A 5 mm sessile polyp was found in the cecum and was removed via cold snare polypectomy.  Colon mucosa was normal.  Random biopsies were obtained throughout the colon evaluate for microscopic colitis.    The scope was then withdrawn into the rectum and retroflexed.  The retroflexed view of the anal verge and rectum demonstrates no hemorrhoids.     The scope was straightened, the colon was decompressed and the scope was withdrawn from the patient.      The patient tolerated the procedure well and was taken to the PACU in good condition.    Biopsies:  Yes      Impression:   1. Normal terminal ileum.   2. A 5 mm sessile polyp was found in the cecum and was removed via cold snare polypectomy.    3. Colon mucosa was normal.  Random biopsies were obtained  throughout the colon evaluate for microscopic colitis.      Recommendations:  1.  Await biopsy results    Alphonse Guild, MD  [facility]

## 2020-06-06 ENCOUNTER — Ambulatory Visit
Admit: 2020-06-06 | Discharge: 2020-06-06 | Payer: PRIVATE HEALTH INSURANCE | Attending: Nephrology | Primary: Internal Medicine

## 2020-06-06 DIAGNOSIS — N179 Acute kidney failure, unspecified: Secondary | ICD-10-CM

## 2020-06-06 NOTE — Progress Notes (Signed)
Gabrielle Russian MD   Gabrielle Duel MD FASN FNKF  Gabrielle Daft MD   Gabrielle Applebaum MD      [facility]      Nephrology Consult Note    Reason for Consult:  AKI     Chief Complaint:  CHF   History Obtained From:  Patient     06/06/20     GFR better   BP controlled   Oral intake better   + Diarrhea     S/p EGD and Colonoscopy       Wt Readings from Last 3 Encounters:   06/06/20 269 lb 3.2 oz (122.1 kg)   05/26/20 262 lb (118.8 kg)   05/08/20 276 lb (125.2 kg)       History of Present Illness:    This is a 69 y.o. female who presents to the office for evaluation of  AKI on CKD 3   Pt has CHF was on diuretics   Off now for a week   BNP 200    BP low   On high dose Neurontin           Pt denies any hx of heavy or prolonged NSAID use.   There is no hx of jaundice or hepatitis or sexually transmitted disease.   Pt has no hx of collagen vascular disease or vasculitis.  No history of dysuria or frequency.No recent procedures involving IV contrast.   There is no hx of paraprotein disease.   Pt denies any hx of recurrent UTI , incontinence or nocturia or recurrent nephrolithiasis. No unusual skin rashes .   No tea coloured urine .   Medication reviewed and noted the use of ace/arb, diuretic.    Past Medical History:        Diagnosis Date   ??? CHF (congestive heart failure) ([place])    ??? 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 GERD (gastroesophageal reflux disease)    ??? HTN (hypertension)    ??? Hyperlipidemia    ??? IBS (irritable bowel syndrome)    ??? Lumbar radiculopathy    ??? Morbid obesity ([place])    ??? Muscle fasciculation    ??? Neuropathic pain     back 2/2 to spinal surgeries   ??? SBO (small bowel obstruction) ([place]) 07/1999    with partial colectomy   ??? Spinal fracture of T8 vertebra ([facility])     s/p fusion    ??? Surgical site infection     back, spinal hardware    ??? Urinary incontinence        Past Surgical History:        Procedure Laterality Date   ??? ABDOMINAL HERNIA REPAIR   2010   ??? APPENDECTOMY     ??? BREAST LUMPECTOMY Bilateral     benign lesions   ??? CARDIAC CATHETERIZATION  2012   ??? CHOLECYSTECTOMY     ??? COLONOSCOPY     ??? COLONOSCOPY  05/26/2020    COLONOSCOPY POLYPECTOMY SNARE/COLD BIOPSY performed by Alphonse Guild, MD at [place]   ??? COLONOSCOPY  05/26/2020    COLONOSCOPY WITH BIOPSY performed by Alphonse Guild, MD at [place]   ??? HYSTERECTOMY, TOTAL ABDOMINAL     ??? SHOULDER ARTHROPLASTY Right 2010   ??? TOTAL KNEE ARTHROPLASTY Bilateral 2009   ??? UPPER GASTROINTESTINAL ENDOSCOPY N/A 05/26/2020    EGD BIOPSY performed by Alphonse Guild, MD at [place]  Current Medications:    Current Outpatient Medications   Medication Sig Dispense Refill   ??? polyethylene glycol (GLYCOLAX) 17 g packet Take 17 g by mouth daily as needed for Constipation 527 g 0   ??? tiZANidine (ZANAFLEX) 2 MG tablet Take 1 tablet by mouth nightly as needed (muscle spasms) 30 tablet 0   ??? furosemide (LASIX) 20 MG tablet Take 1 tablet by mouth daily as needed (Fluid retention. Weight gain of more than 5lbs in a day) 30 tablet 0   ??? vitamin D3 (CHOLECALCIFEROL) 25 MCG (1000 UT) TABS tablet Take 2 tablets by mouth daily 60 tablet 5   ??? omeprazole (PRILOSEC) 20 MG delayed release capsule Take 1 capsule by mouth every morning (before breakfast) 90 capsule 1   ??? hydrALAZINE (APRESOLINE) 50 MG tablet Take 1 tablet by mouth 3 times daily 270 tablet 3   ??? ondansetron (ZOFRAN ODT) 4 MG disintegrating tablet Take 1 tablet by mouth every 8 hours as needed for Nausea 60 tablet 0   ??? gabapentin (NEURONTIN) 300 MG capsule Take 1 capsule by mouth nightly for 90 days. 90 capsule 2   ??? carvedilol (COREG) 25 MG tablet Take 1 tablet by mouth 2 times daily 180 tablet 3   ??? traZODone (DESYREL) 50 MG tablet Take 1 tablet by mouth nightly 90 tablet 1   ??? loperamide (IMODIUM) 2 MG capsule Take 2 mg by mouth 4 times daily as needed for Diarrhea       No current facility-administered medications for this visit.        Allergies:  Vancomycin, Aspirin, Mushroom extract complex, Shellfish allergy, Sulfasalazine, Acetaminophen, Betadine [povidone iodine], Ciprofloxacin, Coconut oil, Codeine, Fish-derived products, Ibuprofen, Iodine, Metronidazole, Other, Sulfa antibiotics, Amoxicillin-pot clavulanate, Coconut flavor, Eggs or egg-derived products, and Iodinated diagnostic agents    Social History:   Social History     Socioeconomic History   ??? Marital status: Single     Spouse name: Not on file   ??? Number of children: Not on file   ??? Years of education: Not on file   ??? Highest education level: Not on file   Occupational History   ??? Not on file   Tobacco Use   ??? Smoking status: Never Smoker   ??? Smokeless tobacco: Never Used   Vaping Use   ??? Vaping Use: Never used   Substance and Sexual Activity   ??? Alcohol use: Never   ??? Drug use: Never   ??? Sexual activity: Not on file   Other Topics Concern   ??? Not on file   Social History Narrative   ??? Not on file     Social Determinants of Health     Financial Resource Strain:    ??? Difficulty of Paying Living Expenses:    Food Insecurity:    ??? Worried About Charity fundraiser in the Last Year:    ??? Arboriculturist in the Last Year:    Transportation Needs:    ??? Film/video editor (Medical):    ??? Lack of Transportation (Non-Medical):    Physical Activity:    ??? Days of Exercise per Week:    ??? Minutes of Exercise per Session:    Stress:    ??? Feeling of Stress :    Social Connections:    ??? Frequency of Communication with Friends and Family:    ??? Frequency of Social Gatherings with Friends and Family:    ??? Attends Religious Services:    ???  Active Member of Clubs or Organizations:    ??? Attends Archivist Meetings:    ??? Marital Status:    Intimate Production manager Violence:    ??? Fear of Current or Ex-Partner:    ??? Emotionally Abused:    ??? Physically Abused:    ??? Sexually Abused:        Family History:   Family History   Problem Relation Age of Onset   ??? Cancer Paternal Grandmother         unk         Review of Systems:    Constitutional: No fever, no chills, no lethargy, no weakness.  HEENT:  No headache, otalgia, itchy eyes, nasal discharge or sore throat.  Cardiac:  No chest pain, dyspnea, orthopnea or PND.  Chest:  No cough, phlegm or wheezing.  Abdomen:  No abdominal pain, nausea or vomiting.  Neuro:  No focal weakness, abnormal movements orseizure like activity.  Skin:   No rashes, no itching.  GU:   No hematuria, no pyuria, no dysuria, no flank pain.  Extremities:  No swelling or joint pains.      Objective:  BP 131/73    Pulse 63    Temp 97.4 ??F (36.3 ??C)    Wt 269 lb 3.2 oz (122.1 kg)    BMI 47.69 kg/m??     Physical Exam:  General appearance:Awake, alert, in no acute distress  Skin: warm and dry, no rash or erythema  Eyes: conjunctivae normal and sclera anicteric  ENT: :no thrush no pharyngeal congestion    Neck: without any JVD. No carotid bruits or thyromegaly. No  Lymphadenopathty:  Pulmonary: lungs are clear and without any wheezing or rhonchi   Cardiovascular:Normal S1 & S2, No S3 or  S4, No  Pericardial rub , No Murmur Abdomen: soft nontender, bowel sounds present, no organomegaly,  No ascites  Extremities: no cyanosis, clubbing or edema    Labs:    CBC:   Lab Results   Component Value Date    WBC 3.6 05/08/2020    RBC 5.14 05/08/2020    HGB 14.5 05/08/2020    HCT 45.0 05/08/2020    MCV 87.5 05/08/2020    RDW 14.4 05/08/2020    PLT 116 05/08/2020    MPV 9.9 05/08/2020      BMP:   Lab Results   Component Value Date    NA 137 05/08/2020    NA 142 04/18/2020    NA 144 03/07/2020    K 6.0 05/08/2020    K 4.0 04/18/2020    K 4.2 03/07/2020    K 3.7 01/25/2020    K 4.7 10/26/2019    K 4.7 10/21/2019    CL 100 05/08/2020    CL 104 04/18/2020    CL 106 03/07/2020    CO2 26 05/08/2020    CO2 21 04/18/2020    CO2 23 03/07/2020    BUN 10 05/08/2020    BUN 10 04/18/2020    BUN 7 03/07/2020    CREATININE 0.9 05/08/2020    CREATININE 1.0 04/18/2020    CREATININE 1.0 03/07/2020    GLUCOSE 102 05/08/2020     GLUCOSE 124 04/18/2020    GLUCOSE 94 03/07/2020    CALCIUM 9.1 05/08/2020    CALCIUM 9.0 04/18/2020    CALCIUM 9.2 03/07/2020      BNP:No results found for: BNP  PHOSPHORUS:    Lab Results   Component Value Date    PHOS 3.3 04/18/2020  PHOS 2.9 03/07/2020    PHOS 3.0 10/12/2019     MAGNESIUM: No results found for: MG  ALBUMIN:   Lab Results   Component Value Date    LABALBU 4.2 05/08/2020     IRON:  No results found for: IRON  IRON SATURATION:  No results found for: LABIRON  TIBC:  No results found for: TIBC  FERRITIN:  No results found for: FERRITIN  ANA: No results found for: ANA    SPEP:   Lab Results   Component Value Date    PROT 8.4 05/08/2020     UPEP: No results found for: TPU   HEPBSAG:No results found for: HEPBSAG  HEPCAB:No results found for: HEPCAB  C3: No results found for: C3  C4: No results found for: C4  MPO ANCA: No results found for: MPO .  PR3 ANCA:  No results found for: PR3  PTH: No results found for: PTH    Urine Creatinine:    Lab Results   Component Value Date    LABCREA 446.6 03/07/2020     Urine Eosinophils: No results found for: UREO  Urine Protein:  No results found for: TPU  Urinalysis:  U/A:   Lab Results   Component Value Date    NITRU Negative 10/26/2019    COLORU Yellow 10/26/2019    PHUR 6.0 10/26/2019    WBCUA 3-5 03/07/2020    RBCUA 0-2 03/07/2020    BACTERIA Rare 03/07/2020    CLARITYU Clear 10/26/2019    SPECGRAV >=1.030 10/26/2019    LEUKOCYTESUR Negative 10/26/2019    UROBILINOGEN 0.2 10/26/2019    BILIRUBINUR Negative 10/26/2019    BLOODU Negative 10/26/2019    GLUCOSEU Negative 10/26/2019    KETUA TRACE 10/26/2019    AMORPHOUS Rare 03/07/2020     Wt Readings from Last 3 Encounters:   06/06/20 269 lb 3.2 oz (122.1 kg)   05/26/20 262 lb (118.8 kg)   05/08/20 276 lb (125.2 kg)       Radiology:  Reviewed as available.    Assessment        Plan:    AKI on CKD 3  CHF   HTN   Neuropathy   Vit D def   IBS       Plan   - AKI sec to vol changes   - Hold HCTZ until PO intake better    - EF nl   - BNP 202   - BP controlled   - dec Neurontin   - GFR - 20--->60     - CHF stable       - Avoid any NSAIDS       Thank you for the consultation.  Please do not hesitate to call with questions.    Electronically signed by Gabrielle Duel, MD on 06/06/2020 at 12:46 PM

## 2020-06-07 ENCOUNTER — Encounter

## 2020-06-08 ENCOUNTER — Inpatient Hospital Stay
Admit: 2020-06-08 | Discharge: 2020-06-09 | Disposition: A | Discharge status: Home / Self Care | Payer: PRIVATE HEALTH INSURANCE | Attending: Emergency Medicine

## 2020-06-08 ENCOUNTER — Emergency Department: Admit: 2020-06-08 | Payer: PRIVATE HEALTH INSURANCE | Primary: Internal Medicine

## 2020-06-08 DIAGNOSIS — K5289 Other specified noninfective gastroenteritis and colitis: Secondary | ICD-10-CM

## 2020-06-08 LAB — BASIC METABOLIC PANEL W/ REFLEX TO MG FOR LOW K
Anion Gap: 14 (ref 3–16)
BUN: 9 mg/dL (ref 7–20)
CO2: 23 mmol/L (ref 21–32)
Calcium: 9.1 mg/dL (ref 8.3–10.6)
Chloride: 102 mmol/L (ref 99–110)
Creatinine: 1.1 mg/dL (ref 0.6–1.2)
GFR African American: 59 — AB (ref >60)
GFR Non-African American: 49 — AB (ref >60)
Glucose: 115 mg/dL — ABNORMAL HIGH (ref 70–99)
Potassium reflex Magnesium: 4 mmol/L (ref 3.5–5.1)
Sodium: 139 mmol/L (ref 136–145)

## 2020-06-08 LAB — LIPASE: Lipase: 48 U/L (ref 13.0–60.0)

## 2020-06-08 LAB — HEPATIC FUNCTION PANEL
ALT: 7 U/L — ABNORMAL LOW (ref 10–40)
AST: 13 U/L — ABNORMAL LOW (ref 15–37)
Albumin: 4.4 g/dL (ref 3.4–5.0)
Alkaline Phosphatase: 76 U/L (ref 40–129)
Bilirubin, Direct: <0.2 mg/dL (ref 0.0–0.3)
Total Bilirubin: <0.2 mg/dL (ref 0.0–1.0)
Total Protein: 7.8 g/dL (ref 6.4–8.2)

## 2020-06-08 LAB — EKG 12-LEAD
Atrial Rate: 70 {beats}/min
P Axis: 45 degrees
P-R Interval: 168 ms
Q-T Interval: 460 ms
QRS Duration: 78 ms
QTc Calculation (Bazett): 496 ms
R Axis: 25 degrees
T Axis: -32 degrees
Ventricular Rate: 70 {beats}/min

## 2020-06-08 LAB — URINALYSIS WITH REFLEX TO CULTURE
Bilirubin Urine: NEGATIVE
Blood, Urine: NEGATIVE
Glucose, Ur: NEGATIVE mg/dL
Ketones, Urine: NEGATIVE mg/dL
Leukocyte Esterase, Urine: NEGATIVE
Nitrite, Urine: NEGATIVE
Protein, UA: NEGATIVE mg/dL
Specific Gravity, UA: 1.015 (ref 1.005–1.030)
Urobilinogen, Urine: 0.2 E.U./dL (ref <2.0)
pH, UA: 6 (ref 5.0–8.0)

## 2020-06-08 LAB — CBC WITH AUTO DIFFERENTIAL
Basophils %: 0.5 %
Basophils Absolute: 0 10*3/uL (ref 0.0–0.2)
Eosinophils %: 0.7 %
Eosinophils Absolute: 0 10*3/uL (ref 0.0–0.6)
Hematocrit: 44.6 % (ref 36.0–48.0)
Hemoglobin: 14.4 g/dL (ref 12.0–16.0)
Lymphocytes %: 37.4 %
Lymphocytes Absolute: 1.3 10*3/uL (ref 1.0–5.1)
MCH: 27.8 pg (ref 26.0–34.0)
MCHC: 32.2 g/dL (ref 31.0–36.0)
MCV: 86.4 fL (ref 80.0–100.0)
MPV: 9.4 fL (ref 5.0–10.5)
Monocytes %: 20.8 %
Monocytes Absolute: 0.7 10*3/uL (ref 0.0–1.3)
Neutrophils %: 40.6 %
Neutrophils Absolute: 1.5 10*3/uL — ABNORMAL LOW (ref 1.7–7.7)
Platelets: 127 10*3/uL — ABNORMAL LOW (ref 135–450)
RBC: 5.17 M/uL (ref 4.00–5.20)
RDW: 14.8 % (ref 12.4–15.4)
WBC: 3.6 10*3/uL — ABNORMAL LOW (ref 4.0–11.0)

## 2020-06-08 MED ORDER — IOPAMIDOL 76 % IV SOLN
76 % | Freq: Once | INTRAVENOUS | Status: AC | PRN
Start: 2020-06-08 — End: 2020-06-08
  Administered 2020-06-08: 21:00:00 80 mL via INTRAVENOUS

## 2020-06-08 MED ORDER — MORPHINE SULFATE 4 MG/ML IJ SOLN
4 MG/ML | Freq: Once | INTRAMUSCULAR | Status: AC
Start: 2020-06-08 — End: 2020-06-08
  Administered 2020-06-08: 20:00:00 4 mg via INTRAVENOUS

## 2020-06-08 MED ORDER — METRONIDAZOLE IN NACL 5-0.79 MG/ML-% IV SOLN
5 mg/mL | Freq: Once | INTRAVENOUS | Status: AC
Start: 2020-06-08 — End: 2020-06-08
  Administered 2020-06-09: 500 mg via INTRAVENOUS

## 2020-06-08 MED ORDER — METRONIDAZOLE 500 MG PO TABS
500 MG | ORAL_TABLET | Freq: Two times a day (BID) | ORAL | 0 refills | Status: AC
Start: 2020-06-08 — End: 2020-06-15

## 2020-06-08 MED ORDER — CIPROFLOXACIN HCL 500 MG PO TABS
500 MG | ORAL_TABLET | Freq: Two times a day (BID) | ORAL | 0 refills | Status: AC
Start: 2020-06-08 — End: 2020-06-15

## 2020-06-08 MED ORDER — DIPHENHYDRAMINE HCL 25 MG PO TABS
25 MG | Freq: Once | ORAL | Status: AC
Start: 2020-06-08 — End: 2020-06-08
  Administered 2020-06-08: 20:00:00 50 mg via ORAL

## 2020-06-08 MED ORDER — CIPROFLOXACIN HCL 500 MG PO TABS
500 MG | Freq: Two times a day (BID) | ORAL | Status: DC
Start: 2020-06-08 — End: 2020-06-08
  Administered 2020-06-09: 500 mg via ORAL

## 2020-06-08 MED ORDER — ONDANSETRON HCL 4 MG/2ML IJ SOLN
4 MG/2ML | Freq: Once | INTRAMUSCULAR | Status: AC
Start: 2020-06-08 — End: 2020-06-08
  Administered 2020-06-08: 20:00:00 4 mg via INTRAVENOUS

## 2020-06-08 MED FILL — ONDANSETRON HCL 4 MG/2ML IJ SOLN: 4 MG/2ML | INTRAMUSCULAR | Qty: 2

## 2020-06-08 MED FILL — BENADRYL ALLERGY ULTRATABS 25 MG PO TABS: 25 mg | ORAL | Qty: 2

## 2020-06-08 MED FILL — MORPHINE SULFATE 4 MG/ML IJ SOLN: 4 mg/mL | INTRAMUSCULAR | Qty: 1

## 2020-06-08 NOTE — ED Provider Notes (Signed)
[place]          ATTENDING PHYSICIAN NOTE       Date of evaluation: 06/08/2020    Chief Complaint     Abdominal Pain (on going for about a week. States has been having bowel changes and recently had upper and lower scopes. )      History of Present Illness     Shaconda A Wells is a 69 y.o. female with a history of irritable bowel syndrome, chronic abdominal pain issues and prior appendectomy and cholecystectomy and bowel obstruction who presents with complaints of about a 2-week history of abdominal pain.  She locates the abdominal pain in the epigastric and left upper quadrant areas of the abdomen.  She reports that she had 6 bowel movements 5 days ago and she has been having bowel movements daily but she has not yet had one today.  Her last bowel movement was yesterday and it was formed stool but not hard stool.  She does not feel she is constipated.  She is not having watery diarrhea.  She has some nausea and vomiting.  She has had abdominal pain similar to this in the past and has undergone endoscopy and colonoscopy for it.  She denies any fever.    Review of Systems     Review of Systems   Constitutional: Negative for chills and fever.   Respiratory: Negative for cough and shortness of breath.    Cardiovascular: Negative for chest pain.   Gastrointestinal: Positive for abdominal pain, diarrhea, nausea and vomiting. Negative for constipation.   Genitourinary: Negative for difficulty urinating and dysuria.   All other systems reviewed and are negative.      Past Medical, Surgical, Family, and Social History     She has a past medical history of CHF (congestive heart failure) ([place]), Epidural abscess, GERD (gastroesophageal reflux disease), HTN (hypertension), Hyperlipidemia, IBS (irritable bowel syndrome), Lumbar radiculopathy, Morbid obesity ([place]), Muscle fasciculation, Neuropathic pain, SBO (small bowel obstruction) ([place]), Spinal fracture of T8 vertebra ([place]),  Surgical site infection, and Urinary incontinence.  She has a past surgical history that includes Appendectomy; Breast lumpectomy (Bilateral); Hysterectomy, total abdominal; Cholecystectomy; Total knee arthroplasty (Bilateral, 2009); Cardiac catheterization (2012); Total shoulder arthroplasty (Right, 2010); Abdominal hernia repair (2010); Colonoscopy; Upper gastrointestinal endoscopy (N/A, 05/26/2020); Colonoscopy (05/26/2020); and Colonoscopy (05/26/2020).  Her family history includes Cancer in her paternal grandmother.  She reports that she has never smoked. She has never used smokeless tobacco. She reports that she does not drink alcohol and does not use drugs.    Medications     Previous Medications    CARVEDILOL (COREG) 25 MG TABLET    Take 1 tablet by mouth 2 times daily    FUROSEMIDE (LASIX) 20 MG TABLET    Take 1 tablet by mouth daily as needed (Fluid retention. Weight gain of more than 5lbs in a day)    GABAPENTIN (NEURONTIN) 300 MG CAPSULE    Take 1 capsule by mouth nightly for 90 days.    HYDRALAZINE (APRESOLINE) 50 MG TABLET    Take 1 tablet by mouth 3 times daily    LOPERAMIDE (IMODIUM) 2 MG CAPSULE    Take 2 mg by mouth 4 times daily as needed for Diarrhea    OMEPRAZOLE (PRILOSEC) 20 MG DELAYED RELEASE CAPSULE    Take 1 capsule by mouth every morning (before breakfast)    ONDANSETRON (ZOFRAN ODT) 4 MG DISINTEGRATING TABLET    Take 1 tablet  by mouth every 8 hours as needed for Nausea    TIZANIDINE (ZANAFLEX) 2 MG TABLET    Take 1 tablet by mouth nightly as needed (muscle spasms)    TRAZODONE (DESYREL) 50 MG TABLET    Take 1 tablet by mouth nightly    VITAMIN D3 (CHOLECALCIFEROL) 25 MCG (1000 UT) TABS TABLET    Take 2 tablets by mouth daily       Allergies     She is allergic to vancomycin, aspirin, mushroom extract complex, shellfish allergy, sulfasalazine, acetaminophen, betadine [povidone iodine], ciprofloxacin, coconut oil, codeine, fish-derived products, ibuprofen, iodine, metronidazole, other, sulfa  antibiotics, amoxicillin-pot clavulanate, coconut flavor, eggs or egg-derived products, and iodinated diagnostic agents.    Physical Exam     INITIAL VITALS: BP: 136/73, Temp: 98.6 ??F (37 ??C), Pulse: 76, Resp: 16, SpO2: 99 %   Physical Exam  Vitals and nursing note reviewed.   Constitutional:       General: She is not in acute distress.     Appearance: She is well-developed. She is not diaphoretic.   Cardiovascular:      Rate and Rhythm: Normal rate and regular rhythm.   Pulmonary:      Effort: Pulmonary effort is normal.      Breath sounds: Normal breath sounds.   Abdominal:      General: There is no distension.      Palpations: Abdomen is soft.      Tenderness: There is abdominal tenderness in the right upper quadrant, epigastric area, periumbilical area and left upper quadrant. There is no guarding or rebound.   Musculoskeletal:         General: Normal range of motion.      Cervical back: Normal range of motion.   Skin:     General: Skin is warm and dry.   Neurological:      Mental Status: She is alert and oriented to person, place, and time.   Psychiatric:         Behavior: Behavior normal.         Diagnostic Results     EKG   Interpreted by Juanetta Beets, MD     Rhythm: normal sinus   Rate: normal  Axis: normal  Ectopy: none  Conduction: normal  ST Segments: no acute change  T Waves: nonspecific  Q Waves: none    Clinical Impression: normal sinus rhythm with no acute changes/normal EKG      RADIOLOGY:  CT ABDOMEN PELVIS W IV CONTRAST Additional Contrast? None   Final Result      1.  Moderate stool burden and wall thickening of the distal sigmoid and rectum, correlate for stercoral colitis.    2.  No evidence of bowel obstruction.   3.  Colonic diverticulosis.                LABS:   Results for orders placed or performed during the hospital encounter of 06/08/20   CBC Auto Differential   Result Value Ref Range    WBC 3.6 (L) 4.0 - 11.0 K/uL    RBC 5.17 4.00 - 5.20 M/uL    Hemoglobin 14.4 12.0 - 16.0 g/dL     Hematocrit 44.6 36.0 - 48.0 %    MCV 86.4 80.0 - 100.0 fL    MCH 27.8 26.0 - 34.0 pg    MCHC 32.2 31.0 - 36.0 g/dL    RDW 14.8 12.4 - 15.4 %    Platelets 127 (L) 135 - 450  K/uL    MPV 9.4 5.0 - 10.5 fL    Neutrophils % 40.6 %    Lymphocytes % 37.4 %    Monocytes % 20.8 %    Eosinophils % 0.7 %    Basophils % 0.5 %    Neutrophils Absolute 1.5 (L) 1.7 - 7.7 K/uL    Lymphocytes Absolute 1.3 1.0 - 5.1 K/uL    Monocytes Absolute 0.7 0.0 - 1.3 K/uL    Eosinophils Absolute 0.0 0.0 - 0.6 K/uL    Basophils Absolute 0.0 0.0 - 0.2 K/uL   Basic Metabolic Panel w/ Reflex to MG   Result Value Ref Range    Sodium 139 136 - 145 mmol/L    Potassium reflex Magnesium 4.0 3.5 - 5.1 mmol/L    Chloride 102 99 - 110 mmol/L    CO2 23 21 - 32 mmol/L    Anion Gap 14 3 - 16    Glucose 115 (H) 70 - 99 mg/dL    BUN 9 7 - 20 mg/dL    CREATININE 1.1 0.6 - 1.2 mg/dL    GFR Non-African American 49 (A) >60    GFR African American 59 (A) >60    Calcium 9.1 8.3 - 10.6 mg/dL   Hepatic Function Panel   Result Value Ref Range    Total Protein 7.8 6.4 - 8.2 g/dL    Albumin 4.4 3.4 - 5.0 g/dL    Alkaline Phosphatase 76 40 - 129 U/L    ALT 7 (L) 10 - 40 U/L    AST 13 (L) 15 - 37 U/L    Total Bilirubin <0.2 0.0 - 1.0 mg/dL    Bilirubin, Direct <0.2 0.0 - 0.3 mg/dL    Bilirubin, Indirect see below 0.0 - 1.0 mg/dL   Lipase   Result Value Ref Range    Lipase 48.0 13.0 - 60.0 U/L   Urinalysis Reflex to Culture    Specimen: Urine, clean catch   Result Value Ref Range    Color, UA Yellow Straw/Yellow    Clarity, UA Clear Clear    Glucose, Ur Negative Negative mg/dL    Bilirubin Urine Negative Negative    Ketones, Urine Negative Negative mg/dL    Specific Gravity, UA 1.015 1.005 - 1.030    Blood, Urine Negative Negative    pH, UA 6.0 5.0 - 8.0    Protein, UA Negative Negative mg/dL    Urobilinogen, Urine 0.2 <2.0 E.U./dL    Nitrite, Urine Negative Negative    Leukocyte Esterase, Urine Negative Negative    Microscopic Examination Not Indicated     Urine Type  NotGiven     Urine Reflex to Culture Not Indicated    EKG 12 Lead   Result Value Ref Range    Ventricular Rate 70 BPM    Atrial Rate 70 BPM    P-R Interval 168 ms    QRS Duration 78 ms    Q-T Interval 460 ms    QTc Calculation (Bazett) 496 ms    P Axis 45 degrees    R Axis 25 degrees    T Axis -32 degrees    Diagnosis       EKG performed in ER and to be interpreted by ER physician.Confirmed by MD, ER ([id number]), editor GREEN, MICKEY ([id number]) on 06/08/2020 4:52:54 PM       RECENT VITALS:  BP: 137/78,Temp: 98.6 ??F (37 ??C), Pulse: 76, Resp: 16, SpO2: 99 %       ED Course  Nursing Notes, Past Medical Hx, Past Surgical Hx, Social Hx,Allergies, and Family Hx were reviewed.    patient was given the following medications:  Orders Placed This Encounter   Medications   ??? morphine injection 4 mg   ??? ondansetron (ZOFRAN) injection 4 mg   ??? diphenhydrAMINE (BENADRYL) tablet 50 mg   ??? iopamidol (ISOVUE-370) 76 % injection 80 mL   ??? ciprofloxacin (CIPRO) tablet 500 mg     Order Specific Question:   Antimicrobial Indications     Answer:   Other     Order Specific Question:   Other Abx Indication     Answer:   colitis   ??? metronidazole (FLAGYL) 500 mg in NaCl 100 mL IVPB premix     Order Specific Question:   Antimicrobial Indications     Answer:   Other     Order Specific Question:   Other Abx Indication     Answer:   colitis   ??? ciprofloxacin (CIPRO) 500 MG tablet     Sig: Take 1 tablet by mouth 2 times daily for 7 days     Dispense:  14 tablet     Refill:  0   ??? metroNIDAZOLE (FLAGYL) 500 MG tablet     Sig: Take 1 tablet by mouth 2 times daily for 7 days     Dispense:  14 tablet     Refill:  0       CONSULTS:  None    MEDICAL DECISIONMAKING / ASSESSMENT / PLAN     Siearra A Schellhorn is a 69 y.o. female presenting with abdominal pain.  On my initial evaluation her complaint and location of pain was upper abdomen but then on repeat examination it seemed to be in the lower abdomen, more focused in the left lower quadrant.  Labs were  relatively unremarkable and because of her persistent pain and prior history of bowel obstructions, CT scan was performed which did not show any evidence of obstruction.  She did have a large stool burden in the colon and wall thickening of the distal sigmoid and rectum possibly consistent with colitis.  Given the location of the patient's pain on repeat examination this is likely the cause.  Antibiotics were initiated for treatment of stercoral colitis and patient will be discharged on same.    Clinical Impression     1. Stercoral colitis        Disposition     PATIENT REFERRED TO:  Lavona Mound, MD  [address]  [place] [address]  [phone]    In 5 days  if not improving    [facility] Emergency Department  [address]   [place] [address]  [phone]    If symptoms worsen      DISCHARGE MEDICATIONS:  New Prescriptions    CIPROFLOXACIN (CIPRO) 500 MG TABLET    Take 1 tablet by mouth 2 times daily for 7 days    METRONIDAZOLE (FLAGYL) 500 MG TABLET    Take 1 tablet by mouth 2 times daily for 7 days       DISPOSITION Discharge - Pending Orders Complete 06/08/2020 07:42:10 PM       Juanetta Beets, MD  06/08/20 1954

## 2020-06-08 NOTE — Discharge Instructions (Addendum)
Use tylenol for pain

## 2020-06-09 MED FILL — METRONIDAZOLE IN NACL 5-0.79 MG/ML-% IV SOLN: 5 mg/mL | INTRAVENOUS | Qty: 100

## 2020-06-09 MED FILL — CIPROFLOXACIN HCL 500 MG PO TABS: 500 mg | ORAL | Qty: 1

## 2020-06-12 ENCOUNTER — Emergency Department: Admit: 2020-06-12 | Payer: PRIVATE HEALTH INSURANCE | Primary: Internal Medicine

## 2020-06-12 ENCOUNTER — Inpatient Hospital Stay
Admit: 2020-06-12 | Discharge: 2020-06-12 | Disposition: A | Discharge status: Home / Self Care | Payer: PRIVATE HEALTH INSURANCE | Attending: Emergency Medicine

## 2020-06-12 DIAGNOSIS — M545 Low back pain, unspecified: Secondary | ICD-10-CM

## 2020-06-12 LAB — CBC WITH AUTO DIFFERENTIAL
Basophils %: 0.9 %
Basophils Absolute: 0 10*3/uL (ref 0.0–0.2)
Eosinophils %: 0.8 %
Eosinophils Absolute: 0 10*3/uL (ref 0.0–0.6)
Hematocrit: 41.7 % (ref 36.0–48.0)
Hemoglobin: 13.6 g/dL (ref 12.0–16.0)
Lymphocytes %: 30 %
Lymphocytes Absolute: 1.3 10*3/uL (ref 1.0–5.1)
MCH: 28 pg (ref 26.0–34.0)
MCHC: 32.8 g/dL (ref 31.0–36.0)
MCV: 85.4 fL (ref 80.0–100.0)
MPV: 9.7 fL (ref 5.0–10.5)
Monocytes %: 34.9 %
Monocytes Absolute: 1.5 10*3/uL — ABNORMAL HIGH (ref 0.0–1.3)
Neutrophils %: 33.4 %
Neutrophils Absolute: 1.5 10*3/uL — ABNORMAL LOW (ref 1.7–7.7)
Platelets: 126 10*3/uL — ABNORMAL LOW (ref 135–450)
RBC: 4.88 M/uL (ref 4.00–5.20)
RDW: 14.6 % (ref 12.4–15.4)
WBC: 4.4 10*3/uL (ref 4.0–11.0)

## 2020-06-12 LAB — URINALYSIS
Bilirubin Urine: NEGATIVE
Blood, Urine: NEGATIVE
Glucose, Ur: NEGATIVE mg/dL
Ketones, Urine: NEGATIVE mg/dL
Nitrite, Urine: NEGATIVE
Specific Gravity, UA: 1.025 (ref 1.005–1.030)
Urobilinogen, Urine: 0.2 E.U./dL (ref <2.0)
pH, UA: 6 (ref 5.0–8.0)

## 2020-06-12 LAB — BASIC METABOLIC PANEL
Anion Gap: 12 (ref 3–16)
BUN: 8 mg/dL (ref 7–20)
CO2: 25 mmol/L (ref 21–32)
Calcium: 8.8 mg/dL (ref 8.3–10.6)
Chloride: 103 mmol/L (ref 99–110)
Creatinine: 0.9 mg/dL (ref 0.6–1.2)
GFR African American: >60 (ref >60)
GFR Non-African American: >60 (ref >60)
Glucose: 106 mg/dL — ABNORMAL HIGH (ref 70–99)
Potassium: 4.9 mmol/L (ref 3.5–5.1)
Sodium: 140 mmol/L (ref 136–145)

## 2020-06-12 LAB — MICROSCOPIC URINALYSIS

## 2020-06-12 LAB — HEPATIC FUNCTION PANEL
ALT: 8 U/L — ABNORMAL LOW (ref 10–40)
AST: 23 U/L (ref 15–37)
Albumin: 4 g/dL (ref 3.4–5.0)
Alkaline Phosphatase: 63 U/L (ref 40–129)
Bilirubin, Direct: <0.2 mg/dL (ref 0.0–0.3)
Total Bilirubin: <0.2 mg/dL (ref 0.0–1.0)
Total Protein: 7.7 g/dL (ref 6.4–8.2)

## 2020-06-12 LAB — LIPASE: Lipase: 38 U/L (ref 13.0–60.0)

## 2020-06-12 MED ORDER — OXYCODONE HCL 5 MG PO TABS
5 MG | Freq: Once | ORAL | Status: AC
Start: 2020-06-12 — End: 2020-06-12
  Administered 2020-06-12: 21:00:00 5 mg via ORAL

## 2020-06-12 MED ORDER — ONDANSETRON HCL 4 MG/2ML IJ SOLN
4 MG/2ML | Freq: Once | INTRAMUSCULAR | Status: AC
Start: 2020-06-12 — End: 2020-06-12
  Administered 2020-06-12: 16:00:00 4 mg via INTRAVENOUS

## 2020-06-12 MED ORDER — MORPHINE SULFATE 4 MG/ML IJ SOLN
4 MG/ML | Freq: Once | INTRAMUSCULAR | Status: AC
Start: 2020-06-12 — End: 2020-06-12
  Administered 2020-06-12: 16:00:00 4 mg via INTRAVENOUS

## 2020-06-12 MED ORDER — DIPHENHYDRAMINE HCL 50 MG/ML IJ SOLN
50 MG/ML | Freq: Once | INTRAMUSCULAR | Status: AC
Start: 2020-06-12 — End: 2020-06-12
  Administered 2020-06-12: 17:00:00 50 mg via INTRAVENOUS

## 2020-06-12 MED ORDER — IOPAMIDOL 76 % IV SOLN
76 % | Freq: Once | INTRAVENOUS | Status: AC | PRN
Start: 2020-06-12 — End: 2020-06-12
  Administered 2020-06-12: 17:00:00 80 mL via INTRAVENOUS

## 2020-06-12 MED FILL — DIPHENHYDRAMINE HCL 50 MG/ML IJ SOLN: 50 mg/mL | INTRAMUSCULAR | Qty: 1

## 2020-06-12 MED FILL — MORPHINE SULFATE 4 MG/ML IJ SOLN: 4 mg/mL | INTRAMUSCULAR | Qty: 1

## 2020-06-12 MED FILL — OXYCODONE HCL 5 MG PO TABS: 5 mg | ORAL | Qty: 1

## 2020-06-12 MED FILL — ONDANSETRON HCL 4 MG/2ML IJ SOLN: 4 MG/2ML | INTRAMUSCULAR | Qty: 2

## 2020-06-12 NOTE — ED Notes (Signed)
Pt to CT and back without any issues.      Teena Irani, RN  06/12/20 1331

## 2020-06-12 NOTE — ED Provider Notes (Signed)
[place]          ATTENDING PHYSICIAN NOTE       Date of evaluation: 06/12/2020    Chief Complaint     Abdominal Pain (patient states she has upper abdominal pain that radiates into back since last Wednesday, seen here on Friday for same and given abx with no relief, +n/-v/+d)      History of Present Illness     Gabrielle Wells is a 69 y.o. female who presents to the emergency department with abdominal and back pain.    Patient has remote history of SBO, and more recent history of IBS as well as spinal surgeries that have been complicated by post-operative infections requiring prolonged courses of antibiotics. She was seen in this ED four days ago for essentially the same symptoms as today and was given a course of cipro/flagyl after a CT scan showed stercoral colitis.    Pt returns today complaining of worse pain, diffuse but maximal in the left lower quadrant. Constant, severe, no exacerbating or alleviating factors (hasn't taken any pain medications other the acetaminophen at home). Constipation has resolved, and she has now had several episodes of nonbloody diarrhea. Also complains of nausea/NBNB emesis, but has been able to tolerate PO, including her antibiotics from the previous visit.    She also complains of low back pain, although this has been an ongoing issue. No fecal incontinence (although says that with her diarrhea there have been times when she could not make it to the toilet in time) or urinary retention, no lower extremity weakness or paresthesias. No fevers.    Review of Systems     Review of Systems    Pertinent positive and negative findings as documented in the HPI, otherwise other systems were reviewed and were negative.    Past Medical, Surgical, Family, and Social History     She has a past medical history of CHF (congestive heart failure) ([place]), Epidural abscess, GERD (gastroesophageal reflux disease), HTN (hypertension), Hyperlipidemia, IBS  (irritable bowel syndrome), Lumbar radiculopathy, Morbid obesity ([place]), Muscle fasciculation, Neuropathic pain, SBO (small bowel obstruction) ([place]), Spinal fracture of T8 vertebra ([place]), Surgical site infection, and Urinary incontinence.  She has a past surgical history that includes Appendectomy; Breast lumpectomy (Bilateral); Hysterectomy, total abdominal; Cholecystectomy; Total knee arthroplasty (Bilateral, 2009); Cardiac catheterization (2012); Total shoulder arthroplasty (Right, 2010); Abdominal hernia repair (2010); Colonoscopy; Upper gastrointestinal endoscopy (N/A, 05/26/2020); Colonoscopy (05/26/2020); and Colonoscopy (05/26/2020).  Her family history includes Cancer in her paternal grandmother.  She reports that she has never smoked. She has never used smokeless tobacco. She reports that she does not drink alcohol and does not use drugs.    Medications     Discharge Medication List as of 06/12/2020  5:10 PM      CONTINUE these medications which have NOT CHANGED    Details   hydrALAZINE (APRESOLINE) 50 MG tablet Take 50 mg by mouth 2 times dailyHistorical Med      amLODIPine (NORVASC) 5 MG tablet Take 5 mg by mouth dailyHistorical Med      DULoxetine (CYMBALTA) 30 MG extended release capsule Take 30 mg by mouth dailyHistorical Med      acetaminophen (TYLENOL) 500 MG tablet Take 500 mg by mouth every 6 hours as needed for PainHistorical Med      ciprofloxacin (CIPRO) 500 MG tablet Take 1 tablet by mouth 2 times daily for 7 days, Disp-14 tablet, R-0Normal      metroNIDAZOLE (  FLAGYL) 500 MG tablet Take 1 tablet by mouth 2 times daily for 7 days, Disp-14 tablet, R-0Normal      tiZANidine (ZANAFLEX) 2 MG tablet Take 1 tablet by mouth nightly as needed (muscle spasms), Disp-30 tablet, R-0Normal      furosemide (LASIX) 20 MG tablet Take 1 tablet by mouth daily as needed (Fluid retention. Weight gain of more than 5lbs in a day), Disp-30 tablet, R-0Normal      vitamin D3 (CHOLECALCIFEROL) 25 MCG (1000 UT) TABS tablet  Take 2 tablets by mouth daily, Disp-60 tablet, R-5Normal      omeprazole (PRILOSEC) 20 MG delayed release capsule Take 1 capsule by mouth every morning (before breakfast), Disp-90 capsule, R-1Normal      gabapentin (NEURONTIN) 300 MG capsule Take 1 capsule by mouth nightly for 90 days., Disp-90 capsule, R-2Normal      carvedilol (COREG) 25 MG tablet Take 1 tablet by mouth 2 times daily, Disp-180 tablet, R-3Normal      traZODone (DESYREL) 50 MG tablet Take 1 tablet by mouth nightly, Disp-90 tablet, R-1Normal      loperamide (IMODIUM) 2 MG capsule Take 2 mg by mouth 4 times daily as needed for DiarrheaHistorical Med      ondansetron (ZOFRAN ODT) 4 MG disintegrating tablet Take 1 tablet by mouth every 8 hours as needed for Nausea, Disp-60 tablet, R-0Normal             Allergies     She is allergic to vancomycin, aspirin, mushroom extract complex, shellfish allergy, sulfasalazine, acetaminophen, betadine [povidone iodine], ciprofloxacin, coconut oil, codeine, fish-derived products, ibuprofen, iodine, metronidazole, other, sulfa antibiotics, amoxicillin-pot clavulanate, coconut flavor, eggs or egg-derived products, and iodinated diagnostic agents.    Physical Exam     INITIAL VITALS: BP: (!) 184/90, Temp: 98.5 ??F (36.9 ??C), Pulse: 77, Resp: 19, SpO2: 94 %   Physical Exam    General: Well developed and well nourished. No acute distress.  HEENT: NCAT, PERRL, moist mucous membranes  Neck: Trachea midline, neck supple with FROM  Heart: Regular rate and rhythm. No murmurs, gallops, or rubs appreciated.  Lungs: Clear to auscultation in all fields bilaterally. Normal effort.  Abd: Obese abdomen. Remote well-healed surgical incision in the midline. Diffuse tenderness maximal in the LLQ with guarding. Intact rectal tone with no blood on DRE.  MSK: No obvious deformities. Range of motion grossly intact. Remote, well-healed surgical incision in the midline lumbar back, overlying skin changes.  Extremities: No cyanosis or edema.  Peripheral pulses intact.  Skin: No rashes, abrasions, contusions, or lacerations noted.  Neuro: Alert and oriented, moves all extremities spontaneously. No gross motor or sensory deficits, specifically in the bilateral lower extremities (able to lift and hold BLE off of bed for full 5-second count, strong dorsi/plantarflexion, and intact sensation to light touch through all dermatomes).  Psych: Mood and affect appropriate. Thought process and content normal.    Diagnostic Results     EKG   None    RADIOLOGY:  CT ABDOMEN PELVIS W IV CONTRAST Additional Contrast? None   Final Result      1. Decreased stool burden in the rectosigmoid colon. No evidence of a although this is perforation.          LABS:   Results for orders placed or performed during the hospital encounter of 06/12/20   CBC auto differential   Result Value Ref Range    WBC 4.4 4.0 - 11.0 K/uL    RBC 4.88 4.00 - 5.20 M/uL  Hemoglobin 13.6 12.0 - 16.0 g/dL    Hematocrit 41.7 36.0 - 48.0 %    MCV 85.4 80.0 - 100.0 fL    MCH 28.0 26.0 - 34.0 pg    MCHC 32.8 31.0 - 36.0 g/dL    RDW 14.6 12.4 - 15.4 %    Platelets 126 (L) 135 - 450 K/uL    MPV 9.7 5.0 - 10.5 fL    Neutrophils % 33.4 %    Lymphocytes % 30.0 %    Monocytes % 34.9 %    Eosinophils % 0.8 %    Basophils % 0.9 %    Neutrophils Absolute 1.5 (L) 1.7 - 7.7 K/uL    Lymphocytes Absolute 1.3 1.0 - 5.1 K/uL    Monocytes Absolute 1.5 (H) 0.0 - 1.3 K/uL    Eosinophils Absolute 0.0 0.0 - 0.6 K/uL    Basophils Absolute 0.0 0.0 - 0.2 K/uL   Basic Metabolic Panel   Result Value Ref Range    Sodium 140 136 - 145 mmol/L    Potassium 4.9 3.5 - 5.1 mmol/L    Chloride 103 99 - 110 mmol/L    CO2 25 21 - 32 mmol/L    Anion Gap 12 3 - 16    Glucose 106 (H) 70 - 99 mg/dL    BUN 8 7 - 20 mg/dL    CREATININE 0.9 0.6 - 1.2 mg/dL    GFR Non-African American >60 >60    GFR African American >60 >60    Calcium 8.8 8.3 - 10.6 mg/dL   Urinalysis, reflex to microscopic (Lab)   Result Value Ref Range    Color, UA Yellow  Straw/Yellow    Clarity, UA Clear Clear    Glucose, Ur Negative Negative mg/dL    Bilirubin Urine Negative Negative    Ketones, Urine Negative Negative mg/dL    Specific Gravity, UA 1.025 1.005 - 1.030    Blood, Urine Negative Negative    pH, UA 6.0 5.0 - 8.0    Protein, UA TRACE (A) Negative mg/dL    Urobilinogen, Urine 0.2 <2.0 E.U./dL    Nitrite, Urine Negative Negative    Leukocyte Esterase, Urine SMALL (A) Negative    Microscopic Examination YES     Urine Type NotGiven    Hepatic function panel (LFTs)   Result Value Ref Range    Total Protein 7.7 6.4 - 8.2 g/dL    Albumin 4.0 3.4 - 5.0 g/dL    Alkaline Phosphatase 63 40 - 129 U/L    ALT 8 (L) 10 - 40 U/L    AST 23 15 - 37 U/L    Total Bilirubin <0.2 0.0 - 1.0 mg/dL    Bilirubin, Direct <0.2 0.0 - 0.3 mg/dL    Bilirubin, Indirect see below 0.0 - 1.0 mg/dL   Lipase   Result Value Ref Range    Lipase 38.0 13.0 - 60.0 U/L   Microscopic Urinalysis   Result Value Ref Range    WBC, UA 3-5 0 - 5 /HPF    RBC, UA 0-2 0 - 4 /HPF    Epithelial Cells, UA 2-5 0 - 5 /HPF    Bacteria, UA Rare (A) None Seen /HPF       ED BEDSIDE ULTRASOUND:  None    RECENT VITALS:  BP: 138/76,Temp: 98.5 ??F (36.9 ??C), Pulse: 77, Resp: 19, SpO2: 94 %     Procedures     None    ED Course     Nursing  Notes, Past Medical Hx, Past Surgical Hx, Social Hx,Allergies, and Family Hx were reviewed.    patient was given the following medications:  Orders Placed This Encounter   Medications   ??? morphine injection 4 mg   ??? ondansetron (ZOFRAN) injection 4 mg   ??? diphenhydrAMINE (BENADRYL) injection 50 mg   ??? iopamidol (ISOVUE-370) 76 % injection 80 mL   ??? oxyCODONE (ROXICODONE) immediate release tablet 5 mg       CONSULTS:  None    MEDICAL DECISIONMAKING / ASSESSMENT / PLAN     Gabrielle Wells is a 69 y.o. female with history of lumbar spine surgery, remote SBO, recently seen for abdominal and back pain, diagnosed with and treated for stercoral colitis, presenting today with the same, but worse symptoms from  the previous visit.    On initial encounter the patient was in no acute distress with reassuring vitals and no signs of impending hemodynamic or respiratory collapse.    Physical exam was significant for diffuse abdominal tenderness with guarding in the LLQ; well-healed lumbar surgical site, intact rectal tone, and normal neurovascular exam of the bilateral lower extremities.    Labs were significant for normal WBC count and Hb, normal renal function and electrolytes, no significant abnormalities on hepatic function testing, and no evidence of infection or stone on urinalysis.    CT abd/pelv with IV contrast was obtained due to abdominal tenderness with guarding, and in light of previous scan with stercoral colitis, concern now for progression of disease and/or perforation. However, there was no evidence of perforation or ongoing infection, and specifically the inflammation and stool burden from the prior scan were both improved.    With an essentially normal neurovascular exam of the lower extremities I was not concerned at this time for acute spinal pathology. With normal labs and CT scan there was also no emergent or actionable etiology for the patient's abdominal pain.    She was treated with IV ondansetron, morphine, and PO oxycodone with improvement in her pain.    Results and findings were discussed in detail with the patient, who understood and agreed with the plan for outpatient followup and return to the ED with new or worsening symptoms.    Prior to discharge the patient was reassessed and left the ED in stable condition.      Clinical Impression     1. Generalized abdominal pain    2. Chronic low back pain, unspecified back pain laterality, unspecified whether sciatica present        Disposition     PATIENT REFERRED TO:  No follow-up provider specified.    DISCHARGE MEDICATIONS:  Discharge Medication List as of 06/12/2020  5:10 PM          DISPOSITION Decision To Discharge 06/12/2020 04:55:22 PM      Pleas Koch, MD  06/13/20 1430

## 2020-06-12 NOTE — Progress Notes (Signed)
Clinical Pharmacy Progress Note  Medication History     Admit Date: 06/12/2020    List of of current medications patient is taking is complete. Home Medication list in EPIC updated to reflect changes noted below.    Source of information: Patient conversation, Chart Review, Fill History    Patient's home pharmacy: [facility] [phone])     Changes made to medication list:   Medications added:   ??? Amlodipine  ??? Duloxetine  ??? Acetaminophen  ??? Trazadone  Medication doses adjusted:   ??? Hydralazine (pt is taking BID at home)  Other notes:   ??? Patient last took her medications yesterday.  ??? This medication list is now up to date.      Please call with any questions.  Lauro Franklin, PharmD  PGY-1 Pharmacy Resident  [phone]  06/12/2020 2:27 PM

## 2020-06-12 NOTE — Discharge Instructions (Signed)
The exact cause of your abdominal pain is unknown today, but your tests in the emergency department were normal. Lots of people develop abdominal pain that ultimately goes away on its own.    Drink plenty of fluids, at least 6-8 glasses of water per day.  Be sure to eat plenty of fiber.    Follow-up with your primary care doctor within 1 week should you continue to experience your symptoms.  Return to the ED if you develop any fevers, worsening/new/different abdominal pains, or nausea and vomiting that keep you from being able to eat/drink.

## 2020-06-15 NOTE — Telephone Encounter (Signed)
REBECCA FROM [facility] CALLED RE: CMN  LIFT CHAIR, HAS IT BEEN COMPLETED,  IF SO HAS IT BEEN RETURNED, [place]

## 2020-06-16 ENCOUNTER — Encounter: Attending: Cardiovascular Disease | Primary: Internal Medicine

## 2020-06-16 ENCOUNTER — Ambulatory Visit: Payer: PRIVATE HEALTH INSURANCE | Primary: Internal Medicine

## 2020-06-16 NOTE — Telephone Encounter (Signed)
Returned call to [facility] unable to leave message can't speak with anyone, will call back on Monday

## 2020-06-19 ENCOUNTER — Ambulatory Visit
Admit: 2020-06-19 | Discharge: 2020-06-19 | Payer: PRIVATE HEALTH INSURANCE | Attending: Student in an Organized Health Care Education/Training Program | Primary: Internal Medicine

## 2020-06-19 DIAGNOSIS — R1030 Lower abdominal pain, unspecified: Secondary | ICD-10-CM

## 2020-06-19 MED ORDER — SENNOSIDES 8.6 MG PO TABS
8.6 MG | ORAL_TABLET | Freq: Two times a day (BID) | ORAL | 11 refills | Status: AC
Start: 2020-06-19 — End: 2021-06-19

## 2020-06-19 MED ORDER — ATORVASTATIN CALCIUM 40 MG PO TABS
40 MG | ORAL_TABLET | Freq: Every day | ORAL | 1 refills | Status: DC
Start: 2020-06-19 — End: 2020-10-04

## 2020-06-19 MED ORDER — MICONAZOLE NITRATE 2 % VA CREA
2 % | VAGINAL | 1 refills | Status: AC
Start: 2020-06-19 — End: 2020-06-26

## 2020-06-19 MED ORDER — DICYCLOMINE HCL 10 MG PO CAPS
10 MG | ORAL_CAPSULE | Freq: Four times a day (QID) | ORAL | 3 refills | Status: DC
Start: 2020-06-19 — End: 2020-10-25

## 2020-06-19 NOTE — Progress Notes (Signed)
Outpatient Clinic Established Patient Note    Patient: Gabrielle Wells  DOB: 1950/11/27 (69 y.o.)  Date: 06/19/2020    CC: abd pain     HPI:      69 y/o F with PMH of IBS, chronic abd pain, CKD3, HFpEF, DDD, chronic neck pain, HTN    New complaints:   Abd pain that started 2 month ago. Sudden onset, initially sharp now constant dull. Located in lower abd and radiates to back. Feels somewhat different than her DDD back pain. Denies emesis but has nausea. Diarrhea for 3 months around the time pain started. Recently had EGD + c-scope done by Dr Jeanene Erb. Had biopsies done which showed gastritis but no h pyloris or colitis. Most recent CT scan 10/25 showed constipation and she never took any laxatives. Reports she has been drinking fluids. Pain worse w/ having a bowel movements. No blood in stool.     Has not been taking lasix uses it prn now, has not needed it.     Home Meds:  Prior to Visit Medications    Medication Sig Taking? Authorizing Provider   atorvastatin (LIPITOR) 40 MG tablet Take 1 tablet by mouth daily Yes Kabir Brannock, MD   miconazole (MICONAZOLE 7) 2 % vaginal cream Place vaginally nightly. Yes Minna Antis, MD   dicyclomine (BENTYL) 10 MG capsule Take 1 capsule by mouth 4 times daily Yes Minna Antis, MD   senna (SENOKOT) 8.6 MG tablet Take 1 tablet by mouth 2 times daily Yes Minna Antis, MD   hydrALAZINE (APRESOLINE) 50 MG tablet Take 50 mg by mouth 2 times daily Yes Historical Provider, MD   amLODIPine (NORVASC) 5 MG tablet Take 5 mg by mouth daily Yes Historical Provider, MD   DULoxetine (CYMBALTA) 30 MG extended release capsule Take 30 mg by mouth daily Yes Historical Provider, MD   acetaminophen (TYLENOL) 500 MG tablet Take 500 mg by mouth every 6 hours as needed for Pain Yes Historical Provider, MD   tiZANidine (ZANAFLEX) 2 MG tablet Take 1 tablet by mouth nightly as needed (muscle spasms) Yes Shirlee More, MD   vitamin D3 (CHOLECALCIFEROL) 25 MCG (1000 UT) TABS tablet Take 2 tablets by mouth daily Yes  Shirlee More, MD   omeprazole (PRILOSEC) 20 MG delayed release capsule Take 1 capsule by mouth every morning (before breakfast) Yes Shirlee More, MD   ondansetron (ZOFRAN ODT) 4 MG disintegrating tablet Take 1 tablet by mouth every 8 hours as needed for Nausea Yes Arshdeep Tindni, MD   gabapentin (NEURONTIN) 300 MG capsule Take 1 capsule by mouth nightly for 90 days. Yes [name], MD   carvedilol (COREG) 25 MG tablet Take 1 tablet by mouth 2 times daily Yes Hervey Ard, MD   traZODone (DESYREL) 50 MG tablet Take 1 tablet by mouth nightly Yes Hyman Bower, DO   loperamide (IMODIUM) 2 MG capsule Take 2 mg by mouth 4 times daily as needed for Diarrhea Yes Historical Provider, MD   furosemide (LASIX) 20 MG tablet Take 1 tablet by mouth daily as needed (Fluid retention. Weight gain of more than 5lbs in a day)  Patient not taking: Reported on 06/19/2020  Shirlee More, MD       Allergies:    Vancomycin, Aspirin, Mushroom extract complex, Shellfish allergy, Sulfasalazine, Acetaminophen, Betadine [povidone iodine], Ciprofloxacin, Coconut oil, Codeine, Fish-derived products, Ibuprofen, Iodine, Metronidazole, Other, Sulfa antibiotics, Amoxicillin-pot clavulanate, Coconut flavor, Eggs or egg-derived products, and Iodinated diagnostic agents  Health Maintenance Due   Topic Date Due   ??? Shingles Vaccine (1 of 2) Never done   ??? Annual Wellness Visit (AWV)  Never done   ??? Flu vaccine (1) 04/19/2020       Immunization History   Administered Date(s) Administered   ??? COVID-19, J&J, PF, 0.5 mL 11/26/2019   ??? Influenza, Quadv, IM, PF (6 mo and older Fluzone, Flulaval, Fluarix, and 3 yrs and older Afluria) 10/15/2019   ??? Pneumococcal Polysaccharide (Pneumovax23) 09/08/2019   ??? Tdap (Boostrix, Adacel) 09/08/2019       Review of Systems   Constitutional: Positive for fatigue.   Respiratory: Negative for chest tightness and shortness of breath.    Cardiovascular: Negative for palpitations and leg swelling.    Gastrointestinal: Positive for abdominal pain, constipation, diarrhea and nausea. Negative for abdominal distention, blood in stool and vomiting.   Genitourinary: Negative for difficulty urinating, dysuria and hematuria.   Musculoskeletal: Positive for arthralgias, back pain and neck pain.     A 10-organ Review Of Systems was obtained and otherwise unremarkable except as per HPI.    Data: Old records have been reviewed electronically.    PHYSICAL EXAM:  BP (!) 134/90 (Site: Left Upper Arm, Position: Sitting, Cuff Size: Large Adult)    Pulse 88    Temp 97.7 ??F (36.5 ??C) (Temporal)    Resp 20    Ht 5\' 3"  (1.6 m)    Wt 266 lb 12.8 oz (121 kg)    SpO2 95%    BMI 47.26 kg/m??   Physical Exam  Constitutional:       Appearance: She is obese.      Comments: uncomfortable    HENT:      Mouth/Throat:      Mouth: Mucous membranes are moist.   Cardiovascular:      Rate and Rhythm: Normal rate and regular rhythm.      Pulses: Normal pulses.      Heart sounds: No murmur heard.     Pulmonary:      Effort: Pulmonary effort is normal. No respiratory distress.      Breath sounds: No wheezing.   Abdominal:      General: There is no distension.      Palpations: Abdomen is soft. There is no mass.      Tenderness: There is abdominal tenderness. There is no guarding.   Musculoskeletal:      Right lower leg: No edema.      Left lower leg: No edema.   Skin:     General: Skin is dry.   Neurological:      Mental Status: She is alert and oriented to person, place, and time.         Assessment & Plan:      1. Lower abdominal pain  Lower abd pain w/ radiation to back. Previous work up has been negative. CT w/ constipation. EGD/c-scope w/ biopsies revealing for gastritis but no colitis or hpylori. Lipase negative.   - use mirilax to help with constipation   - start bentyl   - stay hydrated  - use lidocaine patch on back   - f/u in 2 weeks     2. Hyperlipidemia, unspecified hyperlipidemia type  Lipid panel 07/2019 elevated. LFT wnl   - start  atorvastatin 40 mg daily     3. Acute vaginitis  Reports 2 days of itching after using abx   - miconazole cream     Return in about 2  weeks (around 07/03/2020) for abd pain .    Dispo: Pt has been staffed with Dr. Trilby Drummer   _______________  Minna Antis, MD, 06/19/2020 4:16 PM   PGY-2

## 2020-06-19 NOTE — Patient Instructions (Addendum)
-   Use laxative, you may be constipated an having diarrhea around the constipation   - use bentyl and see if it helps with pain    - start atorvastatin for your high cholesterol levels   - increase fluid intake   - use a lidocaine patch for your back pain   - use miconazole cream for your vaginal itch   - f/u in 2 wks

## 2020-06-20 NOTE — Telephone Encounter (Signed)
Sent CMN back

## 2020-07-03 ENCOUNTER — Ambulatory Visit
Payer: PRIVATE HEALTH INSURANCE | Attending: Student in an Organized Health Care Education/Training Program | Primary: Internal Medicine

## 2020-08-02 ENCOUNTER — Ambulatory Visit
Admit: 2020-08-02 | Payer: PRIVATE HEALTH INSURANCE | Attending: Student in an Organized Health Care Education/Training Program | Primary: Internal Medicine

## 2020-08-02 DIAGNOSIS — I110 Hypertensive heart disease with heart failure: Secondary | ICD-10-CM

## 2020-08-02 MED ORDER — TIZANIDINE HCL 2 MG PO TABS
2 MG | ORAL_TABLET | Freq: Every evening | ORAL | 3 refills | Status: DC | PRN
Start: 2020-08-02 — End: 2021-07-05

## 2020-08-02 NOTE — Progress Notes (Signed)
Outpatient Clinic Established Patient Note    Patient: Gabrielle Wells  DOB: 1951-07-15 (69 y.o.)  Date: 08/02/2020    CC: follow up    HPI:      Pt presents to clinic after fall 2 days ago  No prodrome  Fall while showering  Hit her head  Unclear if she suffered LOC, per patient she does not think if she lost consciousness. She remembers the fall and hitting her head, reports that she was down for ~ 1 hour, was unable to reach her life alert. However on further questioning she is unable to describe what happened during the hour while she was down  EMS arrived and helped her up. Pt unable to do so alone due to chronic back issues  Denies dizziness, CP, SOB, palpitation, N/V prior to fall  Had some N/V after fall  Some scalp tenderness on the right side where she hit her head  Having some 5/10 HA since fall. Not taken anything for HA.   Does not have any focal deficits since fall. Some mild nausea    HTN  Well controlled  Adheres to low salt diet  BP at home similar to clinic reading  No regiment exercise 2/2 chronic back and knee issues  Compliant with all meds    Pt reports increased DOE compared to before, SOB with ambulating in the house. At baseline her functional status is not great. 3-4 pillow orthopnea which is stable. Mild pedal edema. UOP unchanged. Takes lasix as needed. Weight stable. Has not seen Dr Keenan Bachelor since Jul (was supposed to f/u in 3 months)     Home Meds:  Prior to Visit Medications    Medication Sig Taking? Authorizing Provider   tiZANidine (ZANAFLEX) 2 MG tablet Take 1 tablet by mouth nightly as needed (muscle spasms) Yes Shirlee More, MD   atorvastatin (LIPITOR) 40 MG tablet Take 1 tablet by mouth daily Yes Minna Antis, MD   dicyclomine (BENTYL) 10 MG capsule Take 1 capsule by mouth 4 times daily Yes Minna Antis, MD   senna (SENOKOT) 8.6 MG tablet Take 1 tablet by mouth 2 times daily Yes Minna Antis, MD   hydrALAZINE (APRESOLINE) 50 MG tablet Take 50 mg by mouth 2 times daily Yes Historical  Provider, MD   amLODIPine (NORVASC) 5 MG tablet Take 5 mg by mouth daily Yes Historical Provider, MD   DULoxetine (CYMBALTA) 30 MG extended release capsule Take 30 mg by mouth daily Yes Historical Provider, MD   furosemide (LASIX) 20 MG tablet Take 1 tablet by mouth daily as needed (Fluid retention. Weight gain of more than 5lbs in a day) Yes Shirlee More, MD   vitamin D3 (CHOLECALCIFEROL) 25 MCG (1000 UT) TABS tablet Take 2 tablets by mouth daily Yes Shirlee More, MD   omeprazole (PRILOSEC) 20 MG delayed release capsule Take 1 capsule by mouth every morning (before breakfast) Yes Shirlee More, MD   ondansetron (ZOFRAN ODT) 4 MG disintegrating tablet Take 1 tablet by mouth every 8 hours as needed for Nausea Yes Arshdeep Tindni, MD   gabapentin (NEURONTIN) 300 MG capsule Take 1 capsule by mouth nightly for 90 days. Yes [name], MD   carvedilol (COREG) 25 MG tablet Take 1 tablet by mouth 2 times daily Yes Hervey Ard, MD   traZODone (DESYREL) 50 MG tablet Take 1 tablet by mouth nightly Yes Hyman Bower, DO   loperamide (IMODIUM) 2 MG capsule Take 2 mg by mouth 4  times daily as needed for Diarrhea Yes Historical Provider, MD   acetaminophen (TYLENOL) 500 MG tablet Take 500 mg by mouth every 6 hours as needed for Pain  Patient not taking: Reported on 08/02/2020  Historical Provider, MD     Allergies:    Vancomycin, Aspirin, Mushroom extract complex, Shellfish allergy, Sulfasalazine, Acetaminophen, Betadine [povidone iodine], Ciprofloxacin, Coconut oil, Codeine, Fish-derived products, Ibuprofen, Iodine, Metronidazole, Other, Sulfa antibiotics, Amoxicillin-pot clavulanate, Coconut flavor, Eggs or egg-derived products, and Iodinated diagnostic agents    Health Maintenance Due   Topic Date Due   ??? Shingles Vaccine (1 of 2) Never done   ??? Annual Wellness Visit (AWV)  Never done   ??? COVID-19 Vaccine (2 - Booster for Janssen series) 01/21/2020   ??? Flu vaccine (1) 04/19/2020   ??? A1C test (Diabetic  or Prediabetic)  08/03/2020   ??? Lipid screen  08/03/2020       Immunization History   Administered Date(s) Administered   ??? COVID-19, J&J, PF, 0.5 mL 11/26/2019   ??? Influenza, Quadv, IM, PF (6 mo and older Fluzone, Flulaval, Fluarix, and 3 yrs and older Afluria) 10/15/2019   ??? Pneumococcal Polysaccharide (Pneumovax23) 09/08/2019   ??? Tdap (Boostrix, Adacel) 09/08/2019     Review of Systems  A 10-organ Review Of Systems was obtained and otherwise unremarkable except as per HPI.    Data: Old records have been reviewed electronically.    PHYSICAL EXAM:  BP 129/86 (Site: Right Upper Arm, Position: Sitting, Cuff Size: Large Adult)    Pulse 72    Temp 97.3 ??F (36.3 ??C) (Temporal)    Resp 20    Ht 5\' 3"  (1.6 m)    Wt 266 lb 12.8 oz (121 kg)    SpO2 96%    BMI 47.26 kg/m??   Physical Exam  Constitutional:       General: She is not in acute distress.     Appearance: Normal appearance. She is obese. She is not ill-appearing, toxic-appearing or diaphoretic.   HENT:      Mouth/Throat:      Mouth: Mucous membranes are moist.      Pharynx: No oropharyngeal exudate or posterior oropharyngeal erythema.   Eyes:      Pupils: Pupils are equal, round, and reactive to light.   Cardiovascular:      Rate and Rhythm: Normal rate and regular rhythm.      Pulses: Normal pulses.      Heart sounds: Normal heart sounds. No murmur heard.      Pulmonary:      Effort: Pulmonary effort is normal. No respiratory distress.      Breath sounds: Normal breath sounds. No wheezing or rales.   Abdominal:      General: Bowel sounds are normal. There is no distension.      Palpations: Abdomen is soft.      Tenderness: There is no abdominal tenderness. There is no guarding.   Musculoskeletal:      Right lower leg: Edema present.      Left lower leg: No edema.   Neurological:      General: No focal deficit present.      Mental Status: She is alert and oriented to person, place, and time. Mental status is at baseline.       Assessment & Plan:      1. Fall at  home, initial encounter  Normal neuro exam. Pt did not want to go to ED when EMS arrived at scene  Counseled pt to go to ED if she has worsening HA, N/V, dizziness, passing out episodes or focal weakness  Not on any blood thinners    2. Resistant hypertension  C/w current regimen    3. Chronic diastolic heart failure ([place])  Appears Euvolemic  DOE likely from deconditioning rather than HF exacerbation  Encouraged to make appt with Dr Keenan Bachelor    4. Cervical pain (neck)  5. Lumbar back pain with radiculopathy affecting left lower extremity  6. Pain in right paraspinal region    - tiZANidine (ZANAFLEX) 2 MG tablet; Take 1 tablet by mouth nightly as needed (muscle spasms)  Dispense: 90 tablet; Refill: 3    Return in about 3 months (around 10/31/2020).    Dispo: Pt has been staffed with Dr. Trilby Drummer  _______________  Shirlee More, MD, 08/02/2020 3:42 PM   PGY-2

## 2020-08-02 NOTE — Patient Instructions (Addendum)
Please make appointment with Dr Keenan Bachelor ASAP    PLEASE GO TO ED IF YOU HAVE WORSENING NAUSEA/VOMITING, HEADACHE, PERSONALITY CHANGES, PASSING OUT EPISODES AND OR WEAKNESS IN HANDS AND FEET    Patient Education        Postconcussion Syndrome: Care Instructions  Your Care Instructions     Postconcussion syndrome occurs after a blow to the head or body. Common symptoms are changes in the ability to concentrate, think, remember, or solve problems. Symptoms, which may include headaches, personality changes, and dizziness, may be related to stress from the events surrounding the accident that caused the injury.  Follow-up care is a key part of your treatment and safety. Be sure to make and go to all appointments, and call your doctor if you are having problems. It's also a good idea to know your test results and keep a list of the medicines you take.  How can you care for yourself at home?  Pain   ?? Rest is the best treatment for postconcussion syndrome.  ?? Do not drive if you have taken a prescription pain medicine.  ?? Rest in a quiet, dark room until your headache is gone. Close your eyes and try to relax or go to sleep. Do not watch TV or read.  ?? Put a cold, moist cloth or cold pack on the painful area for 10 to 20 minutes at a time. Put a thin cloth between the cold pack and your skin.  ?? Have someone gently massage your neck and shoulders.  ?? Take your medicines exactly as prescribed. Call your doctor if you think you are having a problem with your medicine. You will get more details on the specific medicines your doctor prescribes.  Stress   ?? Try to reduce stress. Some ways to do this include:  ? Taking slow, deep breaths.  ? Soaking in a warm bath.  ? Listening to soothing music.  ? Having a massage or back rub.  ? Drinking a warm, nonalcoholic, noncaffeinated beverage.  ?? Get enough sleep.  ?? Eat a healthy, balanced diet. A balanced diet includes whole grains, dairy, fruits and vegetables, and protein. Eat a  variety of foods from each of those groups so you get all the nutrients you need.  ?? Avoid alcohol and illegal drugs.  ?? Try relaxation exercises, such as breathing and muscle relaxation exercises.  ?? Talk to your doctor about counseling. It may help you deal with stress from your accident.  When should you call for help?  Watch closely for changes in your health, and be sure to contact your doctor if:  ?? ?? You do not get better as expected.   ?? ?? Your symptoms, such as headaches, trouble concentrating, or changes in mood, get worse.   Where can you learn more?  Go to [email or web address] and sign in to your MyChart account. Enter [phone] in the [place] box to learn more about "Postconcussion Syndrome: Care Instructions."     If you do not have an account, please click on the "Sign Up Now" link.  Current as of: November 25, 2019??????????????????????????????Content Version: 13.0  ?? 2006-2021 [employer].   Care instructions adapted under license by [facility]. If you have questions about a medical condition or this instruction, always ask your healthcare professional. [place] any warranty or liability for your use of this information.

## 2020-08-08 ENCOUNTER — Ambulatory Visit
Admit: 2020-08-08 | Discharge: 2020-08-08 | Payer: PRIVATE HEALTH INSURANCE | Attending: Cardiovascular Disease | Primary: Internal Medicine

## 2020-08-08 DIAGNOSIS — R0600 Dyspnea, unspecified: Secondary | ICD-10-CM

## 2020-08-08 DIAGNOSIS — R0609 Other forms of dyspnea: Secondary | ICD-10-CM

## 2020-08-08 NOTE — Patient Instructions (Addendum)
Call central scheduling at [phone]  To schedule your heart scan - schedule this within the next few weeks.    In 3 months, ~ March 22, go to the [facility] outpatient lab for FASTING blood work.      Patient Education        MUGA Scan: About This Test  What is it?     A MUGA (mu ltig ated a cquisition) scan is a scan of your heart and blood flow. This type of scan shows how well your heart pumps blood to the rest of your body.  During this test, a small amount of a radioactive substance called a tracer is injected into one of your veins. A camera detects the tracer as it flows through your heart.  The camera takes a series of pictures of your heart. Each picture is taken at a specific time of the heartbeat. The pictures are strung together so that they look like a video of your heart's motion and the blood moving through it.  Why is this test done?  A MUGA scan is done to check the size of the chambers of the heart and how well your heart muscle pumps blood to the rest of the body.  How do you prepare for the test?  ?? You may be asked not to eat or drink for a few hours before the test.  ?? You may be asked to not have any caffeine or smoke for a few hours before the test.  ?? If your test will include exercise, wear comfortable shoes and clothing.  How is the test done?  Before the test  ?? You will need to remove any jewelry that might interfere with the scan.  ?? You may need to take off all or most of your clothes.  ?? You may be given a cloth or paper gown to wear during the test.  During the test  ?? You will lie on an exam table below the camera. Small pads or patches (electrodes) will be attached to your chest.  ?? The injection site on your arm will be cleaned. A needle will be put into a vein and a blood sample will be taken. Then the tracer will be added to the blood, which is put back into your vein. You may feel a quick sting or pinch when the needle is put into your arm.  ?? The camera will be positioned close  to your body. It may be put in different places across your chest to get different views of your heart.  ?? The camera will take pictures as the tracer moves through your bloodstream and into your heart. It is important not to move during the scan.  ?? You may be asked to:  ? Change position for each different view.  ? Do some exercise between scans to see how well your heart functions after the stress of exercise.  After the test  ?? After your scan, you will probably be able to leave the testing room right away.  ?? You may have to wait at the test center until all of your scan pictures have been reviewed. If you moved during the scan and the pictures turned out blurry, the scan may have to be done again.  How long does the test take?  The test may take about 1 to 2 hours.  What are risks of the test?  ?? Anytime you're exposed to radiation, there's a small chance of damage to cells  or tissue. That's the case even with the low-level radioactive tracer used for this test. But the chance of damage is very low compared with the benefits of the test.  ?? The camera doesn't produce any radiation, so you aren't exposed to any more radiation while the scan is being done.  What happens after the test?  ?? Drink lots of water and urinate often after your scan. This helps your body flush out the tracer. If you have kidney, heart, or liver disease and have to limit fluids, talk with your doctor before you increase the amount of fluids you drink.  ?? Most of the tracer will leave your body through your urine or stool within a day. So be sure to flush the toilet right after you use it, and wash your hands well with soap and water. The amount of radiation in the tracer is very small. This means it isn't a risk for people to be around you after the test.  ?? The radioactive tracer used in this test can get into your breast milk. Do not breastfeed your baby for 1 or 2 days after this test. During this time, you can give your baby breast  milk you stored before the test, or you can give formula. Discard the breast milk you pump in the 1 or 2 days after the test.  Follow-up care is a key part of your treatment and safety. Be sure to make and go to all appointments, and call your doctor if you are having problems. It's also a good idea to know your test results and keep a list of the medicines you take.  Where can you learn more?  Go to [email or web address] and sign in to your MyChart account. Enter [phone] in the [place] box to learn more about "MUGA Scan: About This Test."     If you do not have an account, please click on the "Sign Up Now" link.  Current as of: December 16, 2019??????????????????????????????Content Version: 13.0  ?? 2006-2021 [employer], [employer].   Care instructions adapted under license by [facility]. If you have questions about a medical condition or this instruction, always ask your healthcare professional. [place] any warranty or liability for your use of this information.

## 2020-08-08 NOTE — Progress Notes (Signed)
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  ??  Patient was admitted at [facility] 2/22 - 10/15/19 for dehydration, AKI, sinus bradycardia (due to BB toxicity in the setting of AKI).??  ??  Echo??09/2019: nl LV, EF 60-65%, normal diastolic, normal valves and RV.   ??  ECG 10/11/19: sinus brady 55, nonspecific changes, low voltage.   ??  Renal duplex 09/2019: no RAS, indeterminate hypoechoic lesion R kidney.  ??  Urine metanephrines w/ high norepi, normal cortisol, renin and aldosterone.??    FLP 07/2019: TC 224, HDL 62, LDL 137, TG 124 off statin.     Patient is here for a follow up. She reports labile Bps at home. She was started on lipitor 40 about 2 weeks ago by her PCP. She reports progressively worse fatigue and dyspnea with exertion over the last few months, no frank chest pains.     Histories     Past Medical History:   has a past medical history of CHF (congestive heart failure) (HCC), Epidural abscess, GERD (gastroesophageal reflux disease), HTN (hypertension), Hyperlipidemia, IBS (irritable bowel syndrome), Lumbar radiculopathy, Morbid obesity (HCC), Muscle fasciculation, Neuropathic pain, SBO (small bowel obstruction) (HCC), Spinal fracture of T8 vertebra (HCC), Surgical site infection, and Urinary incontinence.    Surgical History:   has a past surgical history that includes Appendectomy; Breast lumpectomy (Bilateral); Hysterectomy, total abdominal; Cholecystectomy; Total knee arthroplasty (Bilateral, 2009); Cardiac catheterization (2012); Total shoulder arthroplasty (Right, 2010); Abdominal hernia repair (2010); Colonoscopy; Upper gastrointestinal endoscopy (N/A, 05/26/2020); Colonoscopy (05/26/2020); and Colonoscopy (05/26/2020).     Social History:   reports that she has never smoked. She has never used smokeless tobacco. She reports that she does not drink alcohol and does not use drugs.     Family  History:  No evidence for sudden cardiac death or premature CAD      Medications:     Home medications were reviewed and are listed below    Prior to Admission medications    Medication Sig Start Date End Date Taking? Authorizing Provider   tiZANidine (ZANAFLEX) 2 MG tablet Take 1 tablet by mouth nightly as needed (muscle spasms) 08/02/20  Yes Otis Peak, MD   atorvastatin (LIPITOR) 40 MG tablet Take 1 tablet by mouth daily 06/19/20  Yes Dorice Lamas, MD   dicyclomine (BENTYL) 10 MG capsule Take 1 capsule by mouth 4 times daily 06/19/20  Yes Dorice Lamas, MD   senna (SENOKOT) 8.6 MG tablet Take 1 tablet by mouth 2 times daily 06/19/20 06/19/21 Yes Hina Virk, MD   hydrALAZINE (APRESOLINE) 50 MG tablet Take 50 mg by mouth 2 times daily   Yes Historical Provider, MD   amLODIPine (NORVASC) 5 MG tablet Take 5 mg by mouth daily   Yes Historical Provider, MD   DULoxetine (CYMBALTA) 30 MG extended release capsule Take 30 mg by mouth daily   Yes Historical Provider, MD   acetaminophen (TYLENOL) 500 MG tablet Take 500 mg by mouth every 6 hours as needed for Pain    Yes Historical Provider, MD   furosemide (LASIX) 20 MG tablet Take 1 tablet by mouth daily as needed (Fluid retention. Weight gain of more than 5lbs in a day) 05/03/20  Yes Otis Peak, MD   vitamin D3 (CHOLECALCIFEROL) 25 MCG (1000 UT) TABS tablet Take 2 tablets by mouth daily 05/01/20  Yes Otis Peak, MD  omeprazole (PRILOSEC) 20 MG delayed release capsule Take 1 capsule by mouth every morning (before breakfast) 04/11/20  Yes Otis Peak, MD   ondansetron (ZOFRAN ODT) 4 MG disintegrating tablet Take 1 tablet by mouth every 8 hours as needed for Nausea 03/07/20  Yes Arshdeep Tindni, MD   carvedilol (COREG) 25 MG tablet Take 1 tablet by mouth 2 times daily 01/07/20  Yes Laurette Schimke, MD   traZODone (DESYREL) 50 MG tablet Take 1 tablet by mouth nightly 12/10/19  Yes Cato Mulligan, DO   loperamide (IMODIUM) 2 MG capsule Take 2 mg by mouth 4 times  daily as needed for Diarrhea   Yes Historical Provider, MD   gabapentin (NEURONTIN) 300 MG capsule Take 1 capsule by mouth nightly for 90 days. 02/03/20 08/02/20  Allegra Grana, MD          Allergy:     Vancomycin, Aspirin, Mushroom extract complex, Shellfish allergy, Sulfasalazine, Acetaminophen, Betadine [povidone iodine], Ciprofloxacin, Coconut oil, Codeine, Fish-derived products, Ibuprofen, Iodine, Metronidazole, Other, Sulfa antibiotics, Amoxicillin-pot clavulanate, Coconut flavor, Eggs or egg-derived products, and Iodinated diagnostic agents       Review of Systems:     All 12 point review of symptoms completed. Pertinent positives identified in the HPI, all other review of symptoms negative as below.    CONSTITUTIONAL: + fatigue  SKIN: No rash or pruritis.  EYES: No visual changes or diplopia. No scleral icterus.  ENT: No Headaches, hearing loss or vertigo. No mouth sores or sore throat.  CARDIOVASCULAR: No chest pain/chest pressure/chest discomfort. No palpitations. No edema.   RESPIRATORY: + dyspnea. No cough or wheezing, no sputum production.  GASTROINTESTINAL: No N/V/D. No abdominal pain, appetite loss, blood in stools.  GENITOURINARY: No dysuria, trouble voiding, or hematuria.  MUSCULOSKELETAL:  No gait disturbance, weakness or joint complaints.  NEUROLOGICAL: No headache, diplopia, change in muscle strength, numbness or tingling. No change in gait, balance, coordination, mood, affect, memory, mentation, behavior.  PSHYCH: No anxiety, loss of interest, change in sexual behavior, feelings of self-harm, or confusion.  ENDOCRINE: No excessive thirst, fluid intake, or urination. No tremor.  HEMATOLOGIC: No abnormal bruising or bleeding.  ALLERGY: No nasal congestion or hives.      Physical Examination:     Vitals:    08/08/20 1405   BP: 108/60   Pulse: 61   Weight: 262 lb 9.6 oz (119.1 kg)       Wt Readings from Last 3 Encounters:   08/08/20 262 lb 9.6 oz (119.1 kg)   08/02/20 266 lb 12.8 oz (121 kg)    06/19/20 266 lb 12.8 oz (121 kg)         General Appearance:  Alert, cooperative, no distress, appears stated age Appropriate weight   Head:  Normocephalic, without obvious abnormality, atraumatic   Eyes:  PERRL, conjunctiva/corneas clear EOM intact  Ears normal   Throat no lesions       Nose: Nares normal, no drainage or sinus tenderness   Throat: Lips, mucosa, and tongue normal   Neck: Supple, symmetrical, trachea midline, no adenopathy, thyroid: not enlarged, symmetric, no tenderness/mass/nodules, no carotid bruit       Lungs:   Clear to auscultation bilaterally, respirations unlabored   Chest Wall:  No tenderness or deformity   Heart:  Regular rhythm, rate is controlled, S1, S2 normal, there is no murmur, there is no rub or gallop, cannot assess jvd, no bilateral lower extremity edema   Abdomen:   Soft, non-tender, bowel sounds  active all four quadrants,  no masses, no organomegaly       Extremities: Extremities normal, atraumatic, no cyanosis   Pulses: 2+ and symmetric   Skin: Skin color, texture, turgor normal, no rashes or lesions   Pysch: Normal mood and affect   Neurologic: Normal gross motor and sensory exam.  Cranial nerves intact        Labs:     Lab Results   Component Value Date    WBC 4.4 06/12/2020    HGB 13.6 06/12/2020    HCT 41.7 06/12/2020    MCV 85.4 06/12/2020    PLT 126 (L) 06/12/2020     Lab Results   Component Value Date    NA 140 06/12/2020    K 4.9 06/12/2020    CL 103 06/12/2020    CO2 25 06/12/2020    BUN 8 06/12/2020    CREATININE 0.9 06/12/2020    GLUCOSE 106 (H) 06/12/2020    CALCIUM 8.8 06/12/2020    PROT 7.7 06/12/2020    LABALBU 4.0 06/12/2020    BILITOT <0.2 06/12/2020    ALKPHOS 63 06/12/2020    AST 23 06/12/2020    ALT 8 (L) 06/12/2020    LABGLOM >60 06/12/2020    GFRAA >60 06/12/2020    AGRATIO 1.0 (L) 05/08/2020    GLOB 4.2 05/08/2020         Lab Results   Component Value Date    CHOL 224 (H) 08/04/2019     Lab Results   Component Value Date    TRIG 124 08/04/2019     Lab  Results   Component Value Date    HDL 62 (H) 08/04/2019     Lab Results   Component Value Date    LDLCALC 137 (H) 08/04/2019     Lab Results   Component Value Date    LABVLDL 25 08/04/2019     No results found for: [facility]    Lab Results   Component Value Date    INR 1.03 10/11/2019    PROTIME 12.0 10/11/2019       The 10-year ASCVD risk score Mikey Bussing DC Jr., et al., 2013) is: 10.5%    Values used to calculate the score:      Age: 39 years      Sex: Female      Is Non-Hispanic African American: Yes      Diabetic: No      Tobacco smoker: No      Systolic Blood Pressure: 536 mmHg      Is BP treated: Yes      HDL Cholesterol: 62 mg/dL      Total Cholesterol: 224 mg/dL      Assessment / Plan:      Diagnosis Orders   1. DOE (dyspnea on exertion)  LIPID PANEL    CK    ALT    NM MYOCARDIAL SPECT REST EXERCISE OR RX   2. Chronic diastolic heart failure (HCC)  LIPID PANEL    CK    ALT    NM MYOCARDIAL SPECT REST EXERCISE OR RX        1. Resistant HTN:   Patient reports compliance with meds and diet. It could be secondary HTN??(elevated norepinephrine per urine test). She follows with Dr Silvestre Moment, nephrologist.   ??  -Will defer to PCP vs endocrinologist further investigation regarding abnormal norepi levels.??  -Cw coreg 25 bid  -Off ARB due to AKI on CKD  -Cw hydralazine 50 mg  bid (will not increase to 100 bid due to causing severe hypotension).   -Cw amlodipine 5 mg daily.   -Low salt diet  ??  2. Chronic HFpEF:   Patient appears euvolemic.??  ??  -BP control as above.   -Low salt diet  -Agree with taking HCTZ only prn.     3. Exertional dypnea and fatigue:   I cannot exclude ischemia.     -Will plan for lexi    4. HLP:   Patient was started on lipitor by PCP.     -Repeat FLP, CPK and ALT in 3 months.         Return in about 6 months (around 02/06/2021).      I have spent 35 minutes of face to face time with the patient with more than 50% spent counseling and coordinating care.       I have personally reviewed the reports and  images of labs, radiological studies, cardiac studies including ECG's and telemetry, current and old medical records. The note was completed using EMR and Dragon dictation system. Every effort was made to ensure accuracy; however, inadvertent computerized transcription errors may be present.    All questions and concerns were addressed to the patient/family. Alternatives to my treatment were discussed.     I would like to thank you for providing me the opportunity to participate in the care of your patient. If you have any questions, please do not hesitate to contact me.     Hervey Ard, MD, [facility], [place]  [address]  [place] [address]  Main Office Phone: [phone]  Heart Failure Hotline: [phone]  Fax: [phone]

## 2020-08-22 ENCOUNTER — Encounter

## 2020-08-22 MED ORDER — FUROSEMIDE 20 MG PO TABS
20 MG | ORAL_TABLET | Freq: Every day | ORAL | 3 refills | Status: DC | PRN
Start: 2020-08-22 — End: 2021-07-05

## 2020-08-22 NOTE — Telephone Encounter (Signed)
Pt switching pharm

## 2020-08-28 ENCOUNTER — Ambulatory Visit: Payer: PRIVATE HEALTH INSURANCE | Primary: Internal Medicine

## 2020-08-28 ENCOUNTER — Inpatient Hospital Stay: Payer: PRIVATE HEALTH INSURANCE | Primary: Internal Medicine

## 2020-08-29 ENCOUNTER — Ambulatory Visit: Payer: PRIVATE HEALTH INSURANCE | Primary: Student in an Organized Health Care Education/Training Program

## 2020-08-29 ENCOUNTER — Ambulatory Visit: Payer: PRIVATE HEALTH INSURANCE | Primary: Internal Medicine

## 2020-10-04 MED ORDER — ATORVASTATIN CALCIUM 40 MG PO TABS
40 MG | ORAL_TABLET | Freq: Every day | ORAL | 1 refills | Status: DC
Start: 2020-10-04 — End: 2021-07-16

## 2020-10-04 NOTE — Telephone Encounter (Signed)
Refill Atorvastatin to 90 day fill  Last OV  08-02-20  08-02-20 Dr.George  Next appt.  11-29-20

## 2020-10-10 ENCOUNTER — Encounter: Attending: Nephrology | Primary: Internal Medicine

## 2020-10-22 ENCOUNTER — Emergency Department: Admit: 2020-10-23 | Payer: PRIVATE HEALTH INSURANCE

## 2020-10-22 ENCOUNTER — Inpatient Hospital Stay
Admit: 2020-10-22 | Discharge: 2020-10-23 | Disposition: A | Discharge status: Home / Self Care | Payer: PRIVATE HEALTH INSURANCE

## 2020-10-22 DIAGNOSIS — K6289 Other specified diseases of anus and rectum: Secondary | ICD-10-CM

## 2020-10-22 MED ORDER — OMNIPAQUE (iohexol) 350 mg iodine/mL 150 mL
350 | Freq: Once | INTRAVENOUS | Status: AC | PRN
Start: 2020-10-22 — End: 2020-10-22
  Administered 2020-10-23: 04:00:00 150 mL via INTRAVENOUS

## 2020-10-22 MED ORDER — ondansetron (ZOFRAN) injection 8 mg
4 | Freq: Once | INTRAMUSCULAR | Status: AC
Start: 2020-10-22 — End: 2020-10-22
  Administered 2020-10-23: 02:00:00 8 mg via INTRAVENOUS

## 2020-10-22 MED ORDER — acetaminophen (TYLENOL) tablet 975 mg
325 | Freq: Once | ORAL | Status: AC
Start: 2020-10-22 — End: 2020-10-22
  Administered 2020-10-23: 02:00:00 975 mg via ORAL

## 2020-10-22 MED ORDER — diphenhydrAMINE (BENADRYL) injection 50 mg
50 | Freq: Once | INTRAMUSCULAR | Status: AC
Start: 2020-10-22 — End: 2020-10-22
  Administered 2020-10-23: 02:00:00 50 mg via INTRAVENOUS

## 2020-10-22 MED ORDER — diphenhydrAMINE (BENADRYL) capsule 25 mg
25 | Freq: Once | ORAL | Status: AC
Start: 2020-10-22 — End: 2020-10-22

## 2020-10-22 MED FILL — TYLENOL 325 MG TABLET: 325 325 mg | ORAL | Qty: 3

## 2020-10-22 NOTE — Unmapped (Signed)
Pt urinated via purewick-per pts request. Pt tolerated well.

## 2020-10-22 NOTE — Unmapped (Signed)
[facility] ED Note    Date of service:  10/22/2020    Reason for Visit: Vaginal Pain and Rectal Pain      Patient History     HPI:  Veronica Winters is a 70 y.o. female with PMHx of CHF, HTN, CKD who presents with a chief complaint of rectal pain.     Patient has had ongoing abdominal pain has been seen for this in the past on review of her chart. She endorses severe rectal pain for several days accompanied by diarrhea and vaginal pain. States she believes diarrhea has come out of her vagina in addition to rectum. No history of Crohn disease, ulcerative colitis, cancer, radiation, or hemorrhoids. Previous total hysterectomy. No nausea, vomiting, hematemesis, melena. Denies abdominal pain, dysuria, hematuria urgency, frequency. No abnormal vaginal bleeding or discharge.     Aside from the above, patient denies any aggravating or alleviating factors or associated symptoms.       Past Medical History:   Diagnosis Date   ??? Asthma    ??? CHF (congestive heart failure) (CMS Dx)    ??? Hypercholesteremia    ??? Hypertension    ??? Renal disorder        History reviewed. No pertinent surgical history.    Veronica Winters  reports that she has never smoked. She has never used smokeless tobacco. She reports previous alcohol use. She reports previous drug use.    Previous Medications    ALBUTEROL (PROVENTIL) 2.5 MG /3 ML (0.083 %) NEBULIZER SOLUTION    Inhale into the lungs.    AMLODIPINE (NORVASC) 5 MG TABLET    Take 5 mg by mouth daily.    ATORVASTATIN (LIPITOR) 40 MG TABLET    Take 1 tablet by mouth daily.    BUPROPION SR TABLET 100MG , 12 HR SUSTAINED-RELEASE    Take by mouth.    CARVEDILOL (COREG) 25 MG TABLET    Take 1 tablet by mouth 2 times a day.    CEFUROXIME (CEFTIN) 500 MG TABLET    Take 500 mg by mouth.    CHOLECALCIFEROL, VITAMIN D3, 1000 UNITS TABLET    Take 2 tablets by mouth daily.    FUROSEMIDE (LASIX) 80 MG TABLET    Take by mouth.    GABAPENTIN (NEURONTIN) 300 MG  CAPSULE    Take by mouth.    HYDRALAZINE (APRESOLINE) 50 MG TABLET    Take by mouth.    OMEPRAZOLE (PRILOSEC) 20 MG CAPSULE    Take by mouth.       Allergies:   Allergies as of 10/22/2020 - Fully Reviewed 10/22/2020   Allergen Reaction Noted   ??? Vancomycin Other (See Comments) 05/21/2017   ??? Aspirin  10/14/2011   ??? Ibuprofen Other (See Comments) 10/17/2015   ??? Mushroom Hives and Itching 01/09/2012   ??? Shellfish derived Hives and Itching 01/09/2012   ??? Sulfa (sulfonamide antibiotics) Nausea And Vomiting, Hives, and Itching 10/14/2011   ??? Sulfasalazine Hives and Itching 10/14/2011   ??? Acetaminophen Itching 03/27/2017   ??? Ciprofloxacin  05/18/2017   ??? Coconut oil  07/28/2019   ??? Metronidazole  05/18/2017   ??? Amoxicillin-pot clavulanate Itching 04/26/2016   ??? Coconut flavor Itching 04/20/2013   ??? Codeine Itching 10/14/2011   ??? Egg derived Nausea And Vomiting 01/09/2012   ??? Iodine Itching 10/14/2011   ??? Metrizamide Hives 10/14/2011   ??? Povidone-iodine Itching 10/14/2011       Review of Systems     ROS:  Pertinent positives and negatives included in the HPI. A comprehensive review of systems was performed and negative except as noted previously    Physical Exam     ED Triage Vitals [10/22/20 1745]   Vital Signs Group      Temp 97.8 ??F (36.6 ??C)      Temp Source Oral      Heart Rate 84      Heart Rate Source Monitor      Resp 20      SpO2 99 %      BP (!) 159/99      MAP (mmHg) 115      BP Location Left arm      BP Method Automatic      Patient Position Sitting   SpO2 99 %   O2 Device None (Room air)       Constitutional:  70 y.o. female, well nourished; well developed; well-groomed, in no apparent distress, lying in bed  HEENT:  Normocephalic, atraumatic. Mucous membranes are moist.  Eyes: Anicteric, PERRL, EOMI  Neck:  Supple, Full ROM, trachea midline  Pulmonary:   Lungs clear to auscultation bilaterally with symmetric aeration. No Wheezes/Rales/Rhonchi.   Cardiac:  Regular rate and rhythm.  No murmurs/rubs/gallops.    Abdomen:  Soft, non-distended with suprapubic tenderness to light palpation and mild tenderness to deep palpation.   Extremities: 2+ radial pulses. No extremity deformity, swelling or tenderness appreciated.  Skin:  No rashes or bruising.  Neuro:  Alert and oriented x3. Speech normal. Moves UE and LE spontaneously and symmetrically  Psych:  Mood and affect appropriate for situation.  Rectal: normal appearing external genitalia, hyperpigmentation of gluteal cleft, no perianal swelling, mass, erythema, or tracts to suggest pilonidal cyst or fistula, yellow-brown stool in rectal vault.     Diagnostic Studies     Labs:    Please see electronic medical record for any tests performed in the ED     Radiology:    Please see electronic medical record for any tests performed in the ED      Emergency Department Procedures     Procedures      ED Course and MDM     [name] is a 70 y.o. female with a history and presentation as described above in HPI.  The patient was evaluated by myself, the ED Attending Physician, Dr. Martie Round, MD  and the R4 Dr. Modena Nunnery. All management and disposition plans were discussed and agreed upon. Appropriate labs and diagnostic studies were reviewed as they were made available. Pertinent laboratory studies in medical decision making are listed below.     Upon presentation, the patient was hemodynamically stable and in no acute distress. Exam did not find any external abnormalities causing proctalgia. No external abscess, pilonidal cyst, anal fissure, or internal hemorrhoids. Given concern for possible stool in vagina, CT abd pelvis obtained without evidence of fistulization and low risk factors. Laboratory studies were reassuring without leukocytosis, electrolyte abnormalities, hepatic injury, or lipase elevation for concerning cause of abdominal pain. UA without nitrites and with hematuria. Low suspicion for UTI and urine sent for culture. Patient was informed of hematuria, which  appears to be new. Patient and family member wished to be discharge and follow up with PCP, which is entirely reasonable. Given recent acute onset of symptoms, patient counseled to follow up for re-evaluation if symptoms continue. Risks, benefits, and alternatives were discussed. At this time the patient has been deemed safe for discharge. My customary discharge  instructions including strict return precautions for worsening or new symptoms have been communicated.    Consults:  None    Summary of Treatment in ED:    Medications   acetaminophen (TYLENOL) tablet 975 mg (975 mg Oral Given 10/22/20 2056)   ondansetron (ZOFRAN) injection 8 mg (8 mg Intravenous Given 10/22/20 2054)   diphenhydrAMINE (BENADRYL) injection 50 mg (50 mg Intravenous Given 10/22/20 2101)   OMNIPAQUE (iohexol) 350 mg iodine/mL 150 mL (150 mLs Intravenous Given 10/22/20 2242)     Impression     1. Abdominal pain, unspecified abdominal location    2. Proctalgia        Chapman Moss, MD  PGY-1 Emergency Medicine    This note was dictated using voice-recognition software, which occasionally leads to inadvertent typographic errors.     Chapman Moss, MD  Resident  10/23/20 [phone]

## 2020-10-22 NOTE — Unmapped (Signed)
Pt states she is having rectal and vaginal pain for a few hours

## 2020-10-22 NOTE — Unmapped (Signed)
ED Attending Attestation Note    Date of service:  10/22/2020    This patient was seen by the resident physician.  I have seen and examined the patient, agree with the workup, evaluation, management and diagnosis. The care plan has been discussed and I concur.     My assessment reveals a 70 y.o. female is in complaining of rectal and vaginal area pain.  This is not a new problem but she states it is worse, has tenderness on examination but no rebound.

## 2020-10-23 LAB — DIFFERENTIAL
Basophils Absolute: 74 /uL (ref 0–200)
Basophils Relative: 1.1 % — ABNORMAL HIGH (ref 0.0–1.0)
Eosinophils Absolute: 54 /uL (ref 15–500)
Eosinophils Relative: 0.8 % (ref 0.0–8.0)
Lymphocytes Absolute: 2104 /uL (ref 850–3900)
Lymphocytes Relative: 31.4 % (ref 15.0–45.0)
Monocytes Absolute: 1816 /uL — ABNORMAL HIGH (ref 200–950)
Monocytes Relative: 27.1 % — ABNORMAL HIGH (ref 0.0–12.0)
Neutrophils Absolute: 2653 /uL (ref 1500–7800)
Neutrophils Relative: 39.6 % — ABNORMAL LOW (ref 40.0–80.0)
nRBC: 0 /100{WBCs} (ref 0–0)

## 2020-10-23 LAB — URINALYSIS-MACROSCOPIC W/REFLEX TO MICROSCOPIC
Bilirubin, UA: NEGATIVE
Glucose, UA: NEGATIVE mg/dL
Ketones, UA: NEGATIVE mg/dL
Nitrite, UA: NEGATIVE
Specific Gravity, UA: 1.015 (ref 1.005–1.035)
Urobilinogen, UA: 0.2 EU/dL (ref 0.2–1.0)
pH, UA: 6.5 (ref 5.0–8.0)

## 2020-10-23 LAB — CBC
Hematocrit: 45.3 % — ABNORMAL HIGH (ref 35.0–45.0)
Hemoglobin: 14.6 g/dL (ref 11.7–15.5)
MCH: 27.9 pg (ref 27.0–33.0)
MCHC: 32.3 g/dL (ref 32.0–36.0)
MCV: 86.3 fL (ref 80.0–100.0)
MPV: 9.2 fL (ref 7.5–11.5)
Platelets: 140 10E3/uL (ref 140–400)
RBC: 5.25 10E6/uL — ABNORMAL HIGH (ref 3.80–5.10)
RDW: 14.4 % (ref 11.0–15.0)
WBC: 6.7 10E3/uL (ref 3.8–10.8)

## 2020-10-23 LAB — BASIC METABOLIC PANEL
Anion Gap: 11 mmol/L (ref 3–16)
BUN: 10 mg/dL (ref 7–25)
CO2: 25 mmol/L (ref 21–33)
Calcium: 9.5 mg/dL (ref 8.6–10.3)
Chloride: 104 mmol/L (ref 98–110)
Creatinine: 1.12 mg/dL (ref 0.60–1.30)
EGFR: 53
Glucose: 97 mg/dL (ref 70–100)
Osmolality, Calculated: 289 mOsm/kg (ref 278–305)
Potassium: 4 mmol/L (ref 3.5–5.3)
Sodium: 140 mmol/L (ref 133–146)

## 2020-10-23 LAB — URINALYSIS, MICROSCOPIC
RBC, UA: 8 /HPF (ref 0–3)
Squam Epithel, UA: 1 /HPF (ref 0–5)
WBC, UA: 2 /HPF (ref 0–5)

## 2020-10-23 LAB — HEPATIC FUNCTION PANEL
ALT: 9 U/L (ref 7–52)
AST: 19 U/L (ref 13–39)
Albumin: 4.4 g/dL (ref 3.5–5.7)
Alkaline Phosphatase: 70 U/L (ref 36–125)
Bilirubin, Direct: 0.1 mg/dL (ref 0.0–0.4)
Bilirubin, Indirect: 0.4 mg/dL (ref 0.0–1.1)
Total Bilirubin: 0.5 mg/dL (ref 0.0–1.5)
Total Protein: 7.7 g/dL (ref 6.4–8.9)

## 2020-10-23 LAB — URINE CULTURE

## 2020-10-23 LAB — LIPASE: Lipase: 31 U/L (ref 4–82)

## 2020-10-23 MED FILL — DIPHENHYDRAMINE 50 MG/ML INJECTION SOLUTION: 50 50 mg/mL | INTRAMUSCULAR | Qty: 1

## 2020-10-23 MED FILL — ONDANSETRON HCL (PF) 4 MG/2 ML INJECTION SOLUTION: 4 4 mg/2 mL | INTRAMUSCULAR | Qty: 4

## 2020-10-23 MED FILL — OMNIPAQUE 350 MG IODINE/ML INTRAVENOUS SOLUTION: 350 350 mg iodine/mL | INTRAVENOUS | Qty: 150

## 2020-10-23 NOTE — Unmapped (Addendum)
Thank you for letting us care for you today. You were seen in the emergency department for you abd pain and rectal pain. Your exam, laboratory studies, and CT did not identify any cause for your symptoms. Please follow up with your regular doctor if your symptoms do not improve or worsen in the next several days.      Your urine studies did not show an infection, however your urine was sent for culture. You will be called if it shows an infection growing in the next few days.

## 2020-10-24 ENCOUNTER — Encounter

## 2020-10-24 NOTE — Unmapped (Signed)
Spoke with the patient to complete Social Determinates of Health Screening as part of [facility] participation in the [employer] Program.  Screening completed and logged in [employer].  Patient is determined to be low risk per program criteria. Patient indicated no needs on the screening and will receive routine care.    Further follow up not required at this time.

## 2020-10-25 ENCOUNTER — Encounter

## 2020-10-25 NOTE — Telephone Encounter (Signed)
Refill Dicyclomine  Last OV 08-02-20 Dr.George

## 2020-10-25 NOTE — Telephone Encounter (Signed)
Refill Vitamin D and Duloxetine  Last OV  08-02-20 Dr.George  Next appt.  11-29-20

## 2020-10-25 NOTE — Telephone Encounter (Signed)
Refill Cymbalta and vit D3  Last ov 08/02/20 Gabrielle Wells  Next appt 11/29/20

## 2020-10-26 MED ORDER — DICYCLOMINE HCL 10 MG PO CAPS
10 MG | ORAL_CAPSULE | ORAL | 0 refills | Status: DC
Start: 2020-10-26 — End: 2021-07-16

## 2020-10-26 MED ORDER — VITAMIN D3 25 MCG PO TABS
25 MCG | ORAL_TABLET | ORAL | 2 refills | Status: DC
Start: 2020-10-26 — End: 2021-08-15

## 2020-10-26 MED ORDER — DULOXETINE HCL 30 MG PO CPEP
30 MG | ORAL_CAPSULE | ORAL | 2 refills | Status: DC
Start: 2020-10-26 — End: 2021-08-15

## 2020-11-08 ENCOUNTER — Inpatient Hospital Stay: Admit: 2020-11-08 | Payer: PRIVATE HEALTH INSURANCE | Primary: Internal Medicine

## 2020-11-08 DIAGNOSIS — I5032 Chronic diastolic (congestive) heart failure: Secondary | ICD-10-CM

## 2020-11-08 LAB — NM MYOCARDIAL SPECT REST EXERCISE OR RX: Left Ventricular Ejection Fraction: 68

## 2020-11-08 MED ORDER — REGADENOSON 0.4 MG/5ML IV SOLN
0.45 MG/5ML | Freq: Once | INTRAVENOUS | Status: AC | PRN
Start: 2020-11-08 — End: 2020-11-08
  Administered 2020-11-08: 14:00:00 via INTRAVENOUS

## 2020-11-08 MED ORDER — REGADENOSON 0.4 MG/5ML IV SOLN
0.4 | INTRAVENOUS | Status: AC
Start: 2020-11-08 — End: 2020-11-08

## 2020-11-08 MED ORDER — TECHNETIUM TC 99M TETROFOSMIN IV KIT
Freq: Once | INTRAVENOUS | Status: AC | PRN
Start: 2020-11-08 — End: 2020-11-08
  Administered 2020-11-08: 14:00:00 via INTRAVENOUS

## 2020-11-08 MED FILL — LEXISCAN 0.4 MG/5ML IV SOLN: 0.4 MG/5ML | INTRAVENOUS | Qty: 5

## 2020-11-09 ENCOUNTER — Encounter: Payer: PRIVATE HEALTH INSURANCE | Primary: Internal Medicine

## 2020-11-16 ENCOUNTER — Ambulatory Visit
Admit: 2020-11-16 | Discharge: 2020-11-16 | Payer: PRIVATE HEALTH INSURANCE | Attending: Nephrology | Primary: Internal Medicine

## 2020-11-16 DIAGNOSIS — I5032 Chronic diastolic (congestive) heart failure: Secondary | ICD-10-CM

## 2020-11-16 MED ORDER — AMLODIPINE BESYLATE 10 MG PO TABS
10 MG | ORAL_TABLET | Freq: Every day | ORAL | 5 refills | Status: AC
Start: 2020-11-16 — End: 2021-09-28

## 2020-11-16 NOTE — Progress Notes (Signed)
[name] MD   [name] MD FASN FNKF  [name] MD   [name] MD      [facility]      Nephrology Consult Note    Reason for Consult:  AKI     Chief Complaint:  CHF   History Obtained From:  Patient     11/16/20     GFR better   BP 409 systolic   Oral intake - good       S/p EGD and Colonoscopy       Wt Readings from Last 3 Encounters:   11/16/20 257 lb 6.4 oz (116.8 kg)   08/08/20 262 lb 9.6 oz (119.1 kg)   08/02/20 266 lb 12.8 oz (121 kg)       History of Present Illness:    This is a 70 y.o. female who presents to the office for evaluation of  AKI on CKD 3   Pt has CHF was on diuretics   Off now for a week   BNP 200    BP low   On high dose Neurontin           Pt denies any hx of heavy or prolonged NSAID use.   There is no hx of jaundice or hepatitis or sexually transmitted disease.   Pt has no hx of collagen vascular disease or vasculitis.  No history of dysuria or frequency.No recent procedures involving IV contrast.   There is no hx of paraprotein disease.   Pt denies any hx of recurrent UTI , incontinence or nocturia or recurrent nephrolithiasis. No unusual skin rashes .   No tea coloured urine .   Medication reviewed and noted the use of ace/arb, diuretic.    Past Medical History:        Diagnosis Date   ??? CHF (congestive heart failure) ([place])    ??? 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 GERD (gastroesophageal reflux disease)    ??? HTN (hypertension)    ??? Hyperlipidemia    ??? IBS (irritable bowel syndrome)    ??? Lumbar radiculopathy    ??? Morbid obesity ([place])    ??? Muscle fasciculation    ??? Neuropathic pain     back 2/2 to spinal surgeries   ??? SBO (small bowel obstruction) ([place]) 07/1999    with partial colectomy   ??? Spinal fracture of T8 vertebra ([facility])     s/p fusion    ??? Surgical site infection     back, spinal hardware    ??? Urinary incontinence        Past Surgical History:        Procedure Laterality Date   ??? ABDOMINAL HERNIA REPAIR   2010   ??? APPENDECTOMY     ??? BREAST LUMPECTOMY Bilateral     benign lesions   ??? CARDIAC CATHETERIZATION  2012   ??? CHOLECYSTECTOMY     ??? COLONOSCOPY     ??? COLONOSCOPY  05/26/2020    COLONOSCOPY POLYPECTOMY SNARE/COLD BIOPSY performed by Alphonse Guild, MD at [place]   ??? COLONOSCOPY  05/26/2020    COLONOSCOPY WITH BIOPSY performed by Alphonse Guild, MD at [place]   ??? HYSTERECTOMY, TOTAL ABDOMINAL     ??? SHOULDER ARTHROPLASTY Right 2010   ??? TOTAL KNEE ARTHROPLASTY Bilateral 2009   ??? UPPER GASTROINTESTINAL ENDOSCOPY N/A 05/26/2020    EGD BIOPSY performed by Alphonse Guild, MD at  [facility]       Current Medications:    Current Outpatient Medications   Medication Sig Dispense Refill   ??? amLODIPine (NORVASC) 10 MG tablet Take 1 tablet by mouth daily 30 tablet 5   ??? Cholecalciferol (VITAMIN D3) 25 MCG TABS TAKE TWO TABLETS BY MOUTH DAILY 60 tablet 2   ??? DULoxetine (CYMBALTA) 30 MG extended release capsule TAKE ONE CAPSULE BY MOUTH DAILY 30 capsule 2   ??? dicyclomine (BENTYL) 10 MG capsule TAKE ONE CAPSULE BY MOUTH FOUR TIMES DAILY 120 capsule 0   ??? atorvastatin (LIPITOR) 40 MG tablet Take 1 tablet by mouth daily 90 tablet 1   ??? furosemide (LASIX) 20 MG tablet Take 1 tablet by mouth daily as needed (Fluid retention. Weight gain of more than 5lbs in a day) 30 tablet 3   ??? tiZANidine (ZANAFLEX) 2 MG tablet Take 1 tablet by mouth nightly as needed (muscle spasms) 90 tablet 3   ??? senna (SENOKOT) 8.6 MG tablet Take 1 tablet by mouth 2 times daily 60 tablet 11   ??? hydrALAZINE (APRESOLINE) 50 MG tablet Take 50 mg by mouth 2 times daily     ??? acetaminophen (TYLENOL) 500 MG tablet Take 500 mg by mouth every 6 hours as needed for Pain      ??? omeprazole (PRILOSEC) 20 MG delayed release capsule Take 1 capsule by mouth every morning (before breakfast) 90 capsule 1   ??? ondansetron (ZOFRAN ODT) 4 MG disintegrating tablet Take 1 tablet by mouth every 8 hours as needed for Nausea 60 tablet 0   ??? gabapentin (NEURONTIN) 300 MG  capsule Take 1 capsule by mouth nightly for 90 days. 90 capsule 2   ??? carvedilol (COREG) 25 MG tablet Take 1 tablet by mouth 2 times daily 180 tablet 3   ??? traZODone (DESYREL) 50 MG tablet Take 1 tablet by mouth nightly 90 tablet 1   ??? loperamide (IMODIUM) 2 MG capsule Take 2 mg by mouth 4 times daily as needed for Diarrhea       No current facility-administered medications for this visit.       Allergies:  Vancomycin, Aspirin, Mushroom extract complex, Shellfish allergy, Sulfasalazine, Acetaminophen, Betadine [povidone iodine], Ciprofloxacin, Coconut oil, Codeine, Fish-derived products, Ibuprofen, Iodine, Metronidazole, Other, Sulfa antibiotics, Amoxicillin-pot clavulanate, Coconut flavor, Eggs or egg-derived products, and Iodinated diagnostic agents    Social History:   Social History     Socioeconomic History   ??? Marital status: Single     Spouse name: Not on file   ??? Number of children: Not on file   ??? Years of education: Not on file   ??? Highest education level: Not on file   Occupational History   ??? Not on file   Tobacco Use   ??? Smoking status: Never Smoker   ??? Smokeless tobacco: Never Used   Vaping Use   ??? Vaping Use: Never used   Substance and Sexual Activity   ??? Alcohol use: Never   ??? Drug use: Never   ??? Sexual activity: Not on file   Other Topics Concern   ??? Not on file   Social History Narrative   ??? Not on file     Social Determinants of Health     Financial Resource Strain:    ??? Difficulty of Paying Living Expenses: Not on file   Food Insecurity:    ??? Worried About Running Out of Food in the Last Year: Not on file   ??? Ran  Out of Food in the Last Year: Not on file   Transportation Needs:    ??? Lack of Transportation (Medical): Not on file   ??? Lack of Transportation (Non-Medical): Not on file   Physical Activity:    ??? Days of Exercise per Week: Not on file   ??? Minutes of Exercise per Session: Not on file   Stress:    ??? Feeling of Stress : Not on file   Social Connections:    ??? Frequency of Communication with  Friends and Family: Not on file   ??? Frequency of Social Gatherings with Friends and Family: Not on file   ??? Attends Religious Services: Not on file   ??? Active Member of Clubs or Organizations: Not on file   ??? Attends Archivist Meetings: Not on file   ??? Marital Status: Not on file   Intimate Partner Violence:    ??? Fear of Current or Ex-Partner: Not on file   ??? Emotionally Abused: Not on file   ??? Physically Abused: Not on file   ??? Sexually Abused: Not on file   Housing Stability:    ??? Unable to Pay for Housing in the Last Year: Not on file   ??? Number of Places Lived in the Last Year: Not on file   ??? Unstable Housing in the Last Year: Not on file       Family History:   Family History   Problem Relation Age of Onset   ??? Cancer Paternal Grandmother         unk        Review of Systems:    Constitutional: No fever, no chills, no lethargy, no weakness.  HEENT:  No headache, otalgia, itchy eyes, nasal discharge or sore throat.  Cardiac:  No chest pain, dyspnea, orthopnea or PND.  Chest:  No cough, phlegm or wheezing.  Abdomen:  No abdominal pain, nausea or vomiting.  Neuro:  No focal weakness, abnormal movements orseizure like activity.  Skin:   No rashes, no itching.  GU:   No hematuria, no pyuria, no dysuria, no flank pain.  Extremities:  No swelling or joint pains.      Objective:  BP 135/79    Pulse 87    Wt 257 lb 6.4 oz (116.8 kg)    BMI 45.60 kg/m??     Physical Exam:  General appearance:Awake, alert, in no acute distress  Skin: warm and dry, no rash or erythema  Eyes: conjunctivae normal and sclera anicteric  ENT: :no thrush no pharyngeal congestion    Neck: without any JVD. No carotid bruits or thyromegaly. No  Lymphadenopathty:  Pulmonary: lungs are clear and without any wheezing or rhonchi   Cardiovascular:Normal S1 & S2, No S3 or  S4, No  Pericardial rub , No Murmur Abdomen: soft nontender, bowel sounds present, no organomegaly,  No ascites  Extremities: no cyanosis, clubbing or edema    Labs:    CBC:    Lab Results   Component Value Date    WBC 4.4 06/12/2020    RBC 4.88 06/12/2020    HGB 13.6 06/12/2020    HCT 41.7 06/12/2020    MCV 85.4 06/12/2020    RDW 14.6 06/12/2020    PLT 126 06/12/2020    MPV 9.7 06/12/2020      BMP:   Lab Results   Component Value Date    NA 140 06/12/2020    NA 139 06/08/2020    NA 137 05/08/2020  K 4.9 06/12/2020    K 4.0 06/08/2020    K 6.0 05/08/2020    K 4.0 04/18/2020    K 4.2 03/07/2020    K 4.7 10/26/2019    CL 103 06/12/2020    CL 102 06/08/2020    CL 100 05/08/2020    CO2 25 06/12/2020    CO2 23 06/08/2020    CO2 26 05/08/2020    BUN 8 06/12/2020    BUN 9 06/08/2020    BUN 10 05/08/2020    CREATININE 0.9 06/12/2020    CREATININE 1.1 06/08/2020    CREATININE 0.9 05/08/2020    GLUCOSE 106 06/12/2020    GLUCOSE 115 06/08/2020    GLUCOSE 102 05/08/2020    CALCIUM 8.8 06/12/2020    CALCIUM 9.1 06/08/2020    CALCIUM 9.1 05/08/2020      BNP:No results found for: BNP  PHOSPHORUS:    Lab Results   Component Value Date    PHOS 3.3 04/18/2020    PHOS 2.9 03/07/2020    PHOS 3.0 10/12/2019     MAGNESIUM: No results found for: MG  ALBUMIN:   Lab Results   Component Value Date    LABALBU 4.0 06/12/2020     IRON:  No results found for: IRON  IRON SATURATION:  No results found for: LABIRON  TIBC:  No results found for: TIBC  FERRITIN:  No results found for: FERRITIN  ANA: No results found for: ANA    SPEP:   Lab Results   Component Value Date    PROT 7.7 06/12/2020     UPEP: No results found for: TPU   HEPBSAG:No results found for: HEPBSAG  HEPCAB:No results found for: HEPCAB  C3: No results found for: C3  C4: No results found for: C4  MPO ANCA: No results found for: MPO .  PR3 ANCA:  No results found for: PR3  PTH: No results found for: PTH    Urine Creatinine:    Lab Results   Component Value Date    LABCREA 446.6 03/07/2020     Urine Eosinophils: No results found for: UREO  Urine Protein:  No results found for: TPU  Urinalysis:  U/A:   Lab Results   Component Value Date    NITRU Negative  06/12/2020    COLORU Yellow 06/12/2020    PHUR 6.0 06/12/2020    WBCUA 3-5 06/12/2020    RBCUA 0-2 06/12/2020    BACTERIA Rare 06/12/2020    CLARITYU Clear 06/12/2020    SPECGRAV 1.025 06/12/2020    LEUKOCYTESUR SMALL 06/12/2020    UROBILINOGEN 0.2 06/12/2020    BILIRUBINUR Negative 06/12/2020    BLOODU Negative 06/12/2020    GLUCOSEU Negative 06/12/2020    KETUA Negative 06/12/2020    AMORPHOUS Rare 03/07/2020     Wt Readings from Last 3 Encounters:   11/16/20 257 lb 6.4 oz (116.8 kg)   08/08/20 262 lb 9.6 oz (119.1 kg)   08/02/20 266 lb 12.8 oz (121 kg)       Radiology:  Reviewed as available.    Assessment        Plan:    AKI   CHF   HTN   Neuropathy   Vit D def   IBS       Plan   - AKI sec to vol changes   - EF nl   - BNP 202   - on lasix as needed   - BP controlled   - dec Neurontin   -  GFR - 20--->60     - CHF stable       - Avoid any NSAIDS       Thank you for the consultation.  Please do not hesitate to call with questions.    Electronically signed by [name], MD on 11/16/2020 at 1:40 PM

## 2020-11-28 ENCOUNTER — Encounter

## 2020-11-28 MED ORDER — OMEPRAZOLE 20 MG PO CPDR
20 MG | ORAL_CAPSULE | Freq: Every day | ORAL | 1 refills | Status: AC
Start: 2020-11-28 — End: ?

## 2020-11-28 NOTE — Telephone Encounter (Signed)
Last filled 04-11-20  Last seen 06-20-20  Next appointment 4-13 22

## 2020-11-29 ENCOUNTER — Ambulatory Visit
Payer: PRIVATE HEALTH INSURANCE | Attending: Student in an Organized Health Care Education/Training Program | Primary: Internal Medicine

## 2020-12-04 NOTE — Telephone Encounter (Signed)
LOV 11/16/2020  NOV 02/20/2021

## 2020-12-06 NOTE — Telephone Encounter (Signed)
Pt  said her BP has been running high for the past week. yesterday it was 195/110 and today she has a headache. She takes amlodipine 10 mg, HCTZ 25 mg BID (she thinks) Please advise. Message sent to Dr Silvestre Moment via [employer].

## 2020-12-06 NOTE — Telephone Encounter (Signed)
Per Dr Pershing Proud: have her come tomorrow to the office for a BP check. I spoke with her and she will start looking for a ride. I informed her that she can come any time tomorrow from 8 to 5, she does not need an appointment. Pt voiced understanding.

## 2020-12-07 MED ORDER — HYDRALAZINE HCL 50 MG PO TABS
50 MG | ORAL_TABLET | Freq: Three times a day (TID) | ORAL | 5 refills | Status: DC
Start: 2020-12-07 — End: 2021-12-19

## 2020-12-11 MED ORDER — GABAPENTIN 300 MG PO CAPS
300 MG | ORAL_CAPSULE | ORAL | 0 refills | Status: DC
Start: 2020-12-11 — End: 2021-02-08

## 2020-12-14 ENCOUNTER — Ambulatory Visit
Admit: 2020-12-14 | Discharge: 2020-12-14 | Payer: PRIVATE HEALTH INSURANCE | Attending: Student in an Organized Health Care Education/Training Program | Primary: Internal Medicine

## 2020-12-14 DIAGNOSIS — R0600 Dyspnea, unspecified: Secondary | ICD-10-CM

## 2020-12-14 MED ORDER — TRAMADOL HCL 50 MG PO TABS
50 MG | ORAL_TABLET | Freq: Four times a day (QID) | ORAL | 0 refills | Status: AC | PRN
Start: 2020-12-14 — End: 2020-12-19

## 2020-12-14 MED ORDER — ALBUTEROL SULFATE HFA 108 (90 BASE) MCG/ACT IN AERS
108 (90 Base) MCG/ACT | Freq: Four times a day (QID) | RESPIRATORY_TRACT | 0 refills | Status: AC | PRN
Start: 2020-12-14 — End: 2021-07-05

## 2020-12-14 MED ORDER — TRAMADOL HCL 50 MG PO TABS
50 MG | ORAL_TABLET | Freq: Four times a day (QID) | ORAL | 0 refills | Status: DC | PRN
Start: 2020-12-14 — End: 2020-12-14

## 2020-12-14 MED ORDER — ONDANSETRON 4 MG PO TBDP
4 MG | ORAL_TABLET | Freq: Three times a day (TID) | ORAL | 0 refills | Status: AC | PRN
Start: 2020-12-14 — End: 2020-12-21

## 2020-12-14 NOTE — Progress Notes (Signed)
Department Of Internal Medicine  General Medicine/Primary Care  Established Patient Visit    Patient:  Gabrielle Wells                                               DOB: 1950/09/16  Age: 70 y.o.  MRN: [id number]  Date : 12/14/2020    History Obtained From:  patient    REASON FOR VISIT:  Neck pain    HISTORY OF PRESENT ILLNESS:   The patient is a 70 y.o. female who presents for acute onset neck pain. Last seen in December for fall; no further falls since previous visit.    She is having pain in the back of her neck since yesterday. Describes it as a severe aching pain. Exacerbated by movement. Relieved some by lying down. Lightheadedness. Has been going on for months, has been getting more severe, not more frequent. No sensitivity to light, vision changes, vertigo. She has had decreased appetite and has lost 10 lb since December. She reports waking up with night sweats.    She also started having watery diarrhea yesterday as well. No bright red blood or melena. Went 5-6x yesterday. Denies fevers, chills, abdominal pain, vomiting. No recent antibiotic use, or sick contacts.     Past Medical History:        Diagnosis Date   ??? CHF (congestive heart failure) ([place])    ??? 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 GERD (gastroesophageal reflux disease)    ??? HTN (hypertension)    ??? Hyperlipidemia    ??? IBS (irritable bowel syndrome)    ??? Lumbar radiculopathy    ??? Morbid obesity ([place])    ??? Muscle fasciculation    ??? Neuropathic pain     back 2/2 to spinal surgeries   ??? SBO (small bowel obstruction) ([place]) 07/1999    with partial colectomy   ??? Spinal fracture of T8 vertebra ([facility])     s/p fusion    ??? Surgical site infection     back, spinal hardware    ??? Urinary incontinence        Past Surgical History:        Procedure Laterality Date   ??? ABDOMINAL HERNIA REPAIR  2010   ??? APPENDECTOMY     ??? BREAST LUMPECTOMY Bilateral     benign lesions   ??? CARDIAC CATHETERIZATION  2012   ??? CHOLECYSTECTOMY     ???  COLONOSCOPY     ??? COLONOSCOPY  05/26/2020    COLONOSCOPY POLYPECTOMY SNARE/COLD BIOPSY performed by Alphonse Guild, MD at [facility]   ??? COLONOSCOPY  05/26/2020    COLONOSCOPY WITH BIOPSY performed by Alphonse Guild, MD at [facility]   ??? HYSTERECTOMY, TOTAL ABDOMINAL     ??? SHOULDER ARTHROPLASTY Right 2010   ??? TOTAL KNEE ARTHROPLASTY Bilateral 2009   ??? UPPER GASTROINTESTINAL ENDOSCOPY N/A 05/26/2020    EGD BIOPSY performed by [facility]phonse Guild, MD at [facility]       Family History:       Problem Relation Age of Onset   ??? Cancer Paternal Grandmother         unk        Social History:   TOBACCO:   reports that she has never smoked. She has never used smokeless tobacco.  ETOH:   reports no history of alcohol use.  OCCUPATION:      Allergies:  Vancomycin, Aspirin, Mushroom extract complex, Shellfish allergy, Sulfasalazine, Acetaminophen, Betadine [povidone iodine], Ciprofloxacin, Coconut oil, Codeine, Fish-derived products, Ibuprofen, Iodine, Metronidazole, Other, Sulfa antibiotics, Amoxicillin-pot clavulanate, Coconut flavor, Eggs or egg-derived products, and Iodinated diagnostic agents    Current Medications:    Prior to Admission medications    Medication Sig Start Date End Date Taking? Authorizing Provider   gabapentin (NEURONTIN) 300 MG capsule TAKE ONE CAPSULE BY MOUTH EVERY NIGHT 12/11/20 04/10/21  [name], MD   hydrALAZINE (APRESOLINE) 50 MG tablet Take 1 tablet by mouth 3 times daily 12/07/20   [name], MD   hydroCHLOROthiazide (HYDRODIURIL) 25 MG tablet Take 1 tablet by mouth in the morning and at bedtime 11/08/20   Historical Provider, MD   omeprazole (PRILOSEC) 20 MG delayed release capsule Take 1 capsule by mouth every morning (before breakfast) 11/28/20   Jhonnie Garner, MD   amLODIPine (NORVASC) 10 MG tablet Take 1 tablet by mouth daily 11/16/20   [name], MD   Cholecalciferol (VITAMIN D3) 25 MCG TABS TAKE TWO TABLETS BY MOUTH DAILY 10/26/20   Minna Antis, MD   DULoxetine (CYMBALTA) 30  MG extended release capsule TAKE ONE CAPSULE BY MOUTH DAILY 10/26/20   Minna Antis, MD   dicyclomine (BENTYL) 10 MG capsule TAKE ONE CAPSULE BY MOUTH FOUR TIMES DAILY 10/26/20   Minna Antis, MD   atorvastatin (LIPITOR) 40 MG tablet Take 1 tablet by mouth daily 10/04/20   Freddie Apley, MD   furosemide (LASIX) 20 MG tablet Take 1 tablet by mouth daily as needed (Fluid retention. Weight gain of more than 5lbs in a day) 08/22/20   [name], MD   tiZANidine (ZANAFLEX) 2 MG tablet Take 1 tablet by mouth nightly as needed (muscle spasms) 08/02/20   Shirlee More, MD   senna (SENOKOT) 8.6 MG tablet Take 1 tablet by mouth 2 times daily 06/19/20 06/19/21  Minna Antis, MD   acetaminophen (TYLENOL) 500 MG tablet Take 500 mg by mouth every 6 hours as needed for Pain     Historical Provider, MD   ondansetron (ZOFRAN ODT) 4 MG disintegrating tablet Take 1 tablet by mouth every 8 hours as needed for Nausea 03/07/20   [name], MD   carvedilol (COREG) 25 MG tablet Take 1 tablet by mouth 2 times daily 01/07/20   Hervey Ard, MD   traZODone (DESYREL) 50 MG tablet Take 1 tablet by mouth nightly 12/10/19   Hyman Bower, DO   loperamide (IMODIUM) 2 MG capsule Take 2 mg by mouth 4 times daily as needed for Diarrhea    Historical Provider, MD       ROS: Review of Systems - Per HPI    Physical Exam:      Vitals: There were no vitals taken for this visit.    There is no height or weight on file to calculate BMI.  Wt Readings from Last 3 Encounters:   11/16/20 257 lb 6.4 oz (116.8 kg)   08/08/20 262 lb 9.6 oz (119.1 kg)   08/02/20 266 lb 12.8 oz (121 kg)       Physical Examination:   ?? General appearance: Appears uncomfortable, fully alert and orientated    ?? HEENT: Atraumatic, normocephalic, moist mucus membranes  ?? Respiratory: Normal respiratory effort. Mild end-expiratory wheezes. Denies rubs, or crackles  ?? Cardiovascular: regular S1/S2, with no Murmur, rub or gallop.  ??  Abdomen: Soft, non-tender,  non-distended  ?? Musculoskeletal: No clubbing, cyanosis, peripheral pulses present, cap refill < 2 sec; point midline tenderness in cervical spine; Exquisite tenderness in mid-thoracic spine;  ?? Neurologic: Neurovascularly grossly intact without any focal motor deficits. Cranial nerves:  grossly non-focal.    LABS:    Chemistry:  No results for input(s): BUN, CREATININE, NA, K, CO2, CL, GLU, CA, MG, PHOS, AST, ALT, ALB, PROT in the last 72 hours.    Invalid input(s): TBILI, DBILI, ALP, GLUFASTING    No results for input(s): ALKPHOS, ALT, AST, PROT, BILITOT, BILIDIR, LABALBU in the last 72 hours.     Lab Results   Component Value Date    LABA1C 5.7 08/04/2019     Lab Results   Component Value Date    EAG 116.9 08/04/2019       No results for input(s): NAUR, LABCREA, LABMICR, MICROALBUR, PROTEINU, CALCIUMUR in the last 72 hours.    No results found for: TSHFT4, TSH    Hematology:  No results for input(s): WBC, HGB, HCT, PLT, MCV, MCH, MCHC, RDW, EOSABS in the last 72 hours.    Invalid input(s): NEUTP, LYMPHP, MONOSP, EOSP, BASOP, NEUTABS, LYMPHABS, MONOABS, BASOABS    Lab Results   Component Value Date    VQQVZDGL87 564 12/20/2019     Lipid:  Lab Results   Component Value Date    CHOL 224 (H) 08/04/2019    HDL 62 (H) 08/04/2019    LDLCALC 137 (H) 08/04/2019    TRIG 124 08/04/2019     U/A:  Lab Results   Component Value Date    LABMICR YES 06/12/2020     Imaging:   No results found.     Active Problems:     1. Neck pain    2. Dyspnea on exertion    3. Resistant hypertension    4. Chronic midline low back pain without sciatica    5. Monoclonal gammopathy    6. Vitamin D deficiency     7. Nausea       Assessment/Plan:     EYVETTE CORDON is a 70 y.o. female     1. Neck pain  -No Hx of trauma; Midline tenderness  -Given hx of weight loss, night sweats, and Serum free light chain mismatch, feel that multiple myeloma must be ruled out.  - Will get CT imaging of spine  - Vitamin D level, CRP  - PRN Tramadol; told patient to  use as sparingly as she can; can supplement with tylenol    2. Dyspnea on exertion  - Normal Nuclear stress test 11/08/20. EF 68%; normal echo. Possible this is respiratory. Patient used to have albuterol inhaler; unclear when or why this fell off her list. May benefit from PFTs in the future.  - Refilled albuterol inhaler;    3. Resistant hypertension  -Patient's BP had been running high last week, up to 195/110 last week. Dr. Silvestre Moment increased hydralazine to TID. BP 112/78 today;  -Continue current regimen    4. Chronic midline low back pain without sciatica  -CT imaging and Vitamin D Level as noted above    5. Monoclonal gammopathy   Ref. Range 03/07/2020 15:37   KAPPA, FREE LIGHT CHAINS, SERUM Latest Ref Range: 3.30 - 19.40 mg/L 11.21   K/L RATIO Latest Ref Range: 0.26 - 1.65  0.02 (L)   KAPPA/LAMBDA TEST COMMENT Unknown see below   LAMBDA, FREE LIGHT CHAINS, SERUM Latest Ref Range: 5.71 - 26.30 mg/L  667.71 (H)   -Does not appear to have been followed up on.  -Given the patient reporting back pain, weight loss, and night sweats feel that she would benefit from hematology evaluation    6. Vitamin D deficiency   - TSH with Reflex; Future  - Vitamin D 25 Hydroxy; Future    7. Nausea  - Given 7-day refill for zofran     Case discussed with preceptor.  Follow-up in 1 week    Adrian Saran, MD   Internal Medicine, PGY-2  12/14/2020 1:11 PM  [place] via [employer]

## 2020-12-14 NOTE — Patient Instructions (Addendum)
Please get imaging and labs done and return in 1 week.    Use the tramadol sparingly.You can use tylenol as well to help with your pain.    Please call to schedule appointment with the Hematologist. Referral regarding weight loss, night sweats, and abnormal Kappa/Lambda Ratio

## 2020-12-20 ENCOUNTER — Encounter

## 2020-12-20 LAB — CBC WITH AUTO DIFFERENTIAL
Basophils %: 0.6 %
Basophils Absolute: 0 10*3/uL (ref 0.0–0.2)
Eosinophils %: 5.9 %
Eosinophils Absolute: 0.2 10*3/uL (ref 0.0–0.6)
Hematocrit: 45.4 % (ref 36.0–48.0)
Hemoglobin: 14.7 g/dL (ref 12.0–16.0)
Lymphocytes %: 40.2 %
Lymphocytes Absolute: 1.5 10*3/uL (ref 1.0–5.1)
MCH: 28.3 pg (ref 26.0–34.0)
MCHC: 32.3 g/dL (ref 31.0–36.0)
MCV: 87.7 fL (ref 80.0–100.0)
MPV: 10 fL (ref 5.0–10.5)
Monocytes %: 18.2 %
Monocytes Absolute: 0.7 10*3/uL (ref 0.0–1.3)
Neutrophils %: 35.1 %
Neutrophils Absolute: 1.3 10*3/uL — ABNORMAL LOW (ref 1.7–7.7)
Platelets: 124 10*3/uL — ABNORMAL LOW (ref 135–450)
RBC: 5.18 M/uL (ref 4.00–5.20)
RDW: 14 % (ref 12.4–15.4)
WBC: 3.7 10*3/uL — ABNORMAL LOW (ref 4.0–11.0)

## 2020-12-20 LAB — C-REACTIVE PROTEIN: CRP: 4.2 mg/L (ref 0.0–5.1)

## 2020-12-21 ENCOUNTER — Ambulatory Visit
Payer: PRIVATE HEALTH INSURANCE | Attending: Student in an Organized Health Care Education/Training Program | Primary: Student in an Organized Health Care Education/Training Program

## 2020-12-21 LAB — VITAMIN D 25 HYDROXY: Vit D, 25-Hydroxy: 27.6 ng/mL — ABNORMAL LOW (ref >=30)

## 2020-12-21 LAB — TSH WITH REFLEX: TSH: 1.6 u[IU]/mL (ref 0.27–4.20)

## 2020-12-25 ENCOUNTER — Inpatient Hospital Stay
Admit: 2020-12-25 | Payer: PRIVATE HEALTH INSURANCE | Primary: Student in an Organized Health Care Education/Training Program

## 2020-12-25 DIAGNOSIS — M542 Cervicalgia: Secondary | ICD-10-CM

## 2020-12-25 DIAGNOSIS — G8929 Other chronic pain: Secondary | ICD-10-CM

## 2020-12-25 DIAGNOSIS — M545 Low back pain, unspecified: Secondary | ICD-10-CM

## 2021-01-01 ENCOUNTER — Encounter

## 2021-01-03 ENCOUNTER — Inpatient Hospital Stay
Admit: 2021-01-03 | Discharge: 2021-01-03 | Payer: PRIVATE HEALTH INSURANCE | Primary: Student in an Organized Health Care Education/Training Program

## 2021-01-03 DIAGNOSIS — D472 Monoclonal gammopathy: Secondary | ICD-10-CM

## 2021-01-03 LAB — CBC WITH AUTO DIFFERENTIAL
Basophils %: 0.6 %
Basophils Absolute: 0 10*3/uL (ref 0.0–0.2)
Eosinophils %: 5.3 %
Eosinophils Absolute: 0.2 10*3/uL (ref 0.0–0.6)
Hematocrit: 46.1 % (ref 36.0–48.0)
Hemoglobin: 15 g/dL (ref 12.0–16.0)
Lymphocytes %: 33.3 %
Lymphocytes Absolute: 1.5 10*3/uL (ref 1.0–5.1)
MCH: 28.2 pg (ref 26.0–34.0)
MCHC: 32.5 g/dL (ref 31.0–36.0)
MCV: 86.8 fL (ref 80.0–100.0)
MPV: 9.5 fL (ref 5.0–10.5)
Monocytes %: 24.5 %
Monocytes Absolute: 1.1 10*3/uL (ref 0.0–1.3)
Neutrophils %: 36.3 %
Neutrophils Absolute: 1.6 10*3/uL — ABNORMAL LOW (ref 1.7–7.7)
Platelets: 122 10*3/uL — ABNORMAL LOW (ref 135–450)
RBC: 5.31 M/uL — ABNORMAL HIGH (ref 4.00–5.20)
RDW: 13.9 % (ref 12.4–15.4)
WBC: 4.4 10*3/uL (ref 4.0–11.0)

## 2021-01-03 LAB — PROTIME-INR
INR: 0.95 (ref 0.88–1.12)
Protime: 10.7 s (ref 9.9–12.7)

## 2021-01-03 LAB — APTT: aPTT: 32.5 s (ref 26.2–38.6)

## 2021-01-03 MED ORDER — LIDOCAINE HCL (PF) 1 % IJ SOLN
1 | INTRAMUSCULAR | Status: AC
Start: 2021-01-03 — End: 2021-01-03

## 2021-01-03 MED ORDER — MIDAZOLAM HCL 2 MG/2ML IJ SOLN
2 MG/ML | Freq: Once | INTRAMUSCULAR | Status: AC
Start: 2021-01-03 — End: 2021-01-03
  Administered 2021-01-03: 14:00:00 via INTRAVENOUS

## 2021-01-03 MED ORDER — HEPARIN SOD (PORK) LOCK FLUSH 100 UNIT/ML IV SOLN
100 | INTRAVENOUS | Status: AC
Start: 2021-01-03 — End: 2021-01-03

## 2021-01-03 MED ORDER — LIDOCAINE HCL (PF) 1 % IJ SOLN
1 % | Freq: Once | INTRAMUSCULAR | Status: AC
Start: 2021-01-03 — End: 2021-01-03
  Administered 2021-01-03: 14:00:00 via INTRADERMAL

## 2021-01-03 MED ORDER — BUPIVACAINE HCL (PF) 0.5 % IJ SOLN
0.5 % | Freq: Once | INTRAMUSCULAR | Status: AC
Start: 2021-01-03 — End: 2021-01-03
  Administered 2021-01-03: 14:00:00 via INTRADERMAL

## 2021-01-03 MED ORDER — MIDAZOLAM HCL 2 MG/2ML IJ SOLN
2 | INTRAMUSCULAR | Status: AC
Start: 2021-01-03 — End: 2021-01-03

## 2021-01-03 MED ORDER — FENTANYL CITRATE (PF) 100 MCG/2ML IJ SOLN
100 MCG/2ML | Freq: Once | INTRAMUSCULAR | Status: AC
Start: 2021-01-03 — End: 2021-01-03
  Administered 2021-01-03: 14:00:00 via INTRAVENOUS

## 2021-01-03 MED ORDER — FENTANYL CITRATE (PF) 100 MCG/2ML IJ SOLN
100 | INTRAMUSCULAR | Status: AC
Start: 2021-01-03 — End: 2021-01-03

## 2021-01-03 MED ORDER — HEPARIN SOD (PORK) LOCK FLUSH 100 UNIT/ML IV SOLN
100 UNIT/ML | Freq: Once | INTRAVENOUS | Status: AC
Start: 2021-01-03 — End: 2021-01-03
  Administered 2021-01-03: 14:00:00 via INTRAVENOUS

## 2021-01-03 MED FILL — LIDOCAINE HCL (PF) 1 % IJ SOLN: 1 % | INTRAMUSCULAR | Qty: 30

## 2021-01-03 MED FILL — MIDAZOLAM HCL 2 MG/2ML IJ SOLN: 2 mg/mL | INTRAMUSCULAR | Qty: 2

## 2021-01-03 MED FILL — FENTANYL CITRATE (PF) 100 MCG/2ML IJ SOLN: 100 MCG/2ML | INTRAMUSCULAR | Qty: 2

## 2021-01-03 MED FILL — HEPARIN SOD (PORK) LOCK FLUSH 100 UNIT/ML IV SOLN: 100 [IU]/mL | INTRAVENOUS | Qty: 5

## 2021-01-03 NOTE — Other (Signed)
IMAGING SERVICES NURSING PROGRESS NOTE    Procedure:  BMBX  Jan 03, 2021  Gabrielle Wells      Allergies:    Allergies   Allergen Reactions   ??? Vancomycin Other (See Comments)     Drug induced NEUTROPENIA  Drug induced NEUTROPENIA  Drug induced NEUTROPENIA     ??? Aspirin Other (See Comments)     Stomach bleeding  Stomach bleeding  Stomach bleeding     ??? Mushroom Extract Complex Hives and Itching   ??? Shellfish Allergy Hives and Itching   ??? Sulfasalazine Hives and Itching   ??? Acetaminophen Itching   ??? Betadine [Povidone Iodine]    ??? Ciprofloxacin Other (See Comments)     Made "bottom" raw  Made "bottom" raw     ??? Coconut Oil    ??? Codeine    ??? Fish-Derived Products    ??? Ibuprofen    ??? Iodine    ??? Metronidazole Other (See Comments)     Made "bottom" raw  Made "bottom" raw     ??? Other      detergents   ??? Sulfa Antibiotics    ??? Amoxicillin-Pot Clavulanate Itching     Has never had problems with amoxicillin/ penicillin    HAS TOLERATED ZOSYN WITH ZERO PROBLEMS  Has never had problems with amoxicillin/ penicillin    HAS TOLERATED ZOSYN WITH ZERO PROBLEMS     ??? Coconut Flavor Itching   ??? Eggs Or Egg-Derived Products Nausea And Vomiting   ??? Iodinated Diagnostic Agents Hives     Takes Benadryl 83m PO before receiving iodinated contrast  Takes Benadryl 544mPO before receiving iodinated contrast  Takes Benadryl 5061mO before receiving iodinated contrast  Takes Benadryl 70m31m before receiving iodinated contrast         There were no vitals filed for this visit.    Recent lab work reviewed with MD: yes    Procedure explained to patient by MD: yes   Informed consent obtained:yes  Family with patient:yes    Mental Status:  Normal  Readiness to learn:  Yes  Barriers to learning: No    Pain Assessment Pre-Procedure:  Pain Present:  no  Pain Score:  0  Pain Quality/Description:  na    Time out Procedure Verification with:  _0  RN  _1  Physician  _2  Patient  _3  Other: CT Technologist  Procedure site marked, if applicable:   Yes    Note: Patient arrived A & O x 4, denies pain, breathing easily on room air, Spoke to Dr. HussRed Christiansor to procedure.  Procedural sedation:  Fentanyl:  100mc54mersed:    2mg  37mt Procedureal Note:  Patient tolerated procedure well.  Breathing easily on room air. Pt NSR throughout procedure.  Report given to sds RN.  Patient transported in stable conditon to room 5.    Pain Assessment Post-Procedure:  Pain Present:  no  Pain Score:  0  Pain Quality/Description:  na    Plan of Care Goals:  Safety measures met:  Yes  Patient understands explanation of procedure:  Yes    Time in:  0935  Time out:  0956  Gabrielle Wells R.N. 01/03/2021

## 2021-01-03 NOTE — H&P (Signed)
Patient:  Gabrielle Wells   DOB:   06-07-1951      Relevant clinical history, particularly as it involves the pending procedure, was reviewed and discussed.    The procedure including risks and benefits was discussed at length with the patient (or designated family member) and all questions were answered.  Informed consent to proceed with the procedure was given.    Vital signs were monitored and documented by the Radiology nurse.    Targeted physical examination  Heart : regular rate and rhythm  Lungs : clear, breathing easily  Condition : stable    [facility] nurses notes reviewed and agreed.    Past Medical History:        Diagnosis Date   ??? CHF (congestive heart failure) ([place])    ??? 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 GERD (gastroesophageal reflux disease)    ??? HTN (hypertension)    ??? Hyperlipidemia    ??? IBS (irritable bowel syndrome)    ??? Lumbar radiculopathy    ??? Morbid obesity ([place])    ??? Muscle fasciculation    ??? Neuropathic pain     back 2/2 to spinal surgeries   ??? SBO (small bowel obstruction) ([place]) 07/1999    with partial colectomy   ??? Spinal fracture of T8 vertebra ([facility])     s/p fusion    ??? Surgical site infection     back, spinal hardware    ??? Urinary incontinence        Past Surgical History:           Procedure Laterality Date   ??? ABDOMINAL HERNIA REPAIR  2010   ??? APPENDECTOMY     ??? BREAST LUMPECTOMY Bilateral     benign lesions   ??? CARDIAC CATHETERIZATION  2012   ??? CHOLECYSTECTOMY     ??? COLONOSCOPY     ??? COLONOSCOPY  05/26/2020    COLONOSCOPY POLYPECTOMY SNARE/COLD BIOPSY performed by Alphonse Guild, MD at [place]   ??? COLONOSCOPY  05/26/2020    COLONOSCOPY WITH BIOPSY performed by Alphonse Guild, MD at [place]   ??? HYSTERECTOMY, TOTAL ABDOMINAL     ??? SHOULDER ARTHROPLASTY Right 2010   ??? TOTAL KNEE ARTHROPLASTY Bilateral 2009   ??? UPPER GASTROINTESTINAL ENDOSCOPY N/A 05/26/2020    EGD BIOPSY performed by Alphonse Guild, MD at [facility] ENDOSCOPY       Allergies:   Vancomycin, Aspirin, Mushroom extract complex, Shellfish allergy, Sulfasalazine, Acetaminophen, Betadine [povidone iodine], Ciprofloxacin, Coconut oil, Codeine, Fish-derived products, Ibuprofen, Iodine, Metronidazole, Other, Sulfa antibiotics, Amoxicillin-pot clavulanate, Coconut flavor, Eggs or egg-derived products, and Iodinated diagnostic agents    Medications:   Home Meds  Current Outpatient Medications on File Prior to Encounter   Medication Sig Dispense Refill   ??? albuterol sulfate HFA (VENTOLIN HFA) 108 (90 Base) MCG/ACT inhaler Inhale 2 puffs into the lungs 4 times daily as needed for Wheezing 18 g 0   ??? gabapentin (NEURONTIN) 300 MG capsule TAKE ONE CAPSULE BY MOUTH EVERY NIGHT 30 capsule 0   ??? hydrALAZINE (APRESOLINE) 50 MG tablet Take 1 tablet by mouth 3 times daily 90 tablet 5   ??? hydroCHLOROthiazide (HYDRODIURIL) 25 MG tablet Take 1 tablet by mouth in the morning and at bedtime     ??? omeprazole (PRILOSEC) 20 MG delayed release capsule Take 1 capsule by mouth every morning (before breakfast) 90 capsule 1   ??? amLODIPine (NORVASC) 10 MG tablet  Take 1 tablet by mouth daily 30 tablet 5   ??? Cholecalciferol (VITAMIN D3) 25 MCG TABS TAKE TWO TABLETS BY MOUTH DAILY 60 tablet 2   ??? DULoxetine (CYMBALTA) 30 MG extended release capsule TAKE ONE CAPSULE BY MOUTH DAILY 30 capsule 2   ??? dicyclomine (BENTYL) 10 MG capsule TAKE ONE CAPSULE BY MOUTH FOUR TIMES DAILY 120 capsule 0   ??? atorvastatin (LIPITOR) 40 MG tablet Take 1 tablet by mouth daily 90 tablet 1   ??? furosemide (LASIX) 20 MG tablet Take 1 tablet by mouth daily as needed (Fluid retention. Weight gain of more than 5lbs in a day) (Patient not taking: Reported on 12/14/2020) 30 tablet 3   ??? tiZANidine (ZANAFLEX) 2 MG tablet Take 1 tablet by mouth nightly as needed (muscle spasms) 90 tablet 3   ??? senna (SENOKOT) 8.6 MG tablet Take 1 tablet by mouth 2 times daily (Patient not taking: Reported on 12/14/2020) 60 tablet 11   ??? acetaminophen (TYLENOL) 500 MG tablet Take  500 mg by mouth every 6 hours as needed for Pain  (Patient not taking: Reported on 12/14/2020)     ??? carvedilol (COREG) 25 MG tablet Take 1 tablet by mouth 2 times daily 180 tablet 3   ??? loperamide (IMODIUM) 2 MG capsule Take 2 mg by mouth 4 times daily as needed for Diarrhea (Patient not taking: Reported on 12/14/2020)       No current facility-administered medications on file prior to encounter.       Current Meds  No current facility-administered medications for this encounter.        ASA 2 - Patient with mild systemic disease with no functional limitations    II (soft palate, uvula, fauces visible)    Activity:  2 - Able to move 4 extremities voluntarily on command  Respiration:  2 - Able to breathe deeply and cough freely  Circulation:  2 - BP+/- 56mmHg of normal  Consciousness:  2 - Fully awake  Oxygen Saturation (color):  2 - Able to maintain oxygen saturation >92% on room air    Sedation : Moderate sedation planned

## 2021-01-03 NOTE — Discharge Instructions (Signed)
Discharge Instructions for Biopsy of  Bone marrow  Interventional Radiologist performing procedure   Home Care      Remove the bandage after a day or two.  May apply band-aid if needed.   Diet      Resume your normal diet.    Physical Activity      Rest for the first 24-48 hours.       No driving for 24 hours if you have received analgesia/sedation    May shower.  Do not submerge biopsy site in water (WC:BJSE, hot tub, swimming pool) for one week to prevent infection     Avoid strenuous activities for one week (excercise, heavy lifting etc.)       Call Your Doctor If Any of the Following Occurs    Signs of infection, including fever and chills      Redness, swelling, increasing pain, excessive bleeding, or any discharge from the incision site      Pain that you can't control with over the counter medications    Cough, shortness of breath, or chest pain      Pain when taking a deep breath      You feel your heart rate is fast   In case of an emergency, call 911 immediately.       [place]  A responsible person should be with you for the next 24 hours.       MEDICATION INSTRUCTIONS:  [x] Resume your prescribed medications  [x] You may take a non-prescription "headache remedy", preferably one that does not contain aspirin.  [x]  Keep your medication list updated and carry it with you in case of emergencies.    IF YOU TAKE ANTI-COAGULANTS:  You may typically re-start your blood thinning drugs the day after your procedure UNLESS directed otherwise by the doctor who prescribes the drug for you. Some common blood thinning drugs include   Anti-inflammatory drugs (motrin, aleve)      Aspirin   Anti-coagulants such as plavix (clopidogrel) or coumadin (warfarin)      FOLLOW-UP CARE:  Follow up with your doctor for results and appointment            The above instructions were reviewed with patient/significant other.  The following additional patient specific  information was reviewed with the patient/significant other:       I have read and understand the instructions given to me:         ____________________________________________   (Patient/S.O. Signature)           Nurse Koren Shiver, RN ,R.N.   Date/time 01/03/2021 10:47 AM         Radiology Department  [phone]           SAME DAY SERVICES:  [phone] M-F 7AM-6PM     If you smoke STOP. We care about your health!

## 2021-01-03 NOTE — Procedures (Signed)
IR Brief Postoperative Note    Gabrielle Wells  Date of Birth:  Apr 09, 1951  [id number]    Pre-operative Diagnosis: blood cell abnormality    Post-operative Diagnosis: Same    Procedure: bone marrow bx    Anesthesia: moderate    Surgeons/Assistants: Tarris Delbene    Estimated Blood Loss: Minimal    Complications: none    Specimens: were obtained    See full procedure dictation to follow      Burr Medico, MD MD  01/03/2021

## 2021-01-03 NOTE — Progress Notes (Addendum)
Ambulatory Surgery/Procedure Discharge Note  Pt arrived in sds alert and stable  Lower right back drsg (biopsy site) clean dry and intact  Pt instructed to   Take b/p meds at home  Fluids taken well  Slight nausea - intermittent  C/o pain at biopsy site  Verbal and written discharge instructions given to pt and sister      Vitals:    01/03/21 1033   BP: 130/89   Pulse: 88   Resp: 16   Temp:    SpO2:        No intake/output data recorded.    Restroom use offered before discharge.  Yes    Pain assessment:  level of pain (1-10, 10 severe),   Pain Level: 2        Patient discharged to home/self care. Patient discharged via wheel chair by transporter to waiting family/S.O.       01/03/2021 10:41 AM

## 2021-01-26 NOTE — Telephone Encounter (Signed)
Returned call to [facility] who advised to contact [facility] to add medication management and appt management. Left message for nurse to call clinic after evaluation

## 2021-01-26 NOTE — Telephone Encounter (Addendum)
SHELLY, CARE MANAGER FROM [facility] STATED PT IS HAVING SOME TROUBLE KEEPING UP WITH WHAT MEDICATIONS. WHAT TIMES SHE NEEDS TO TAKE THEM, SO SHELLY WANTS TO HAVE AN NURSE COME IN TO ASSIST PT WITH SETTING UP HER MEDICATIONS AND MAKING SURE THE PT UNDERSTANDS HOW AND WHEN TO TAKE THEM.  SHELLY STATED NOW SKILLED NURSING AGENCY IS WHO CAN SEE PT. SHELLY NEED DR TO OK IT.  SHELLY CAN BE REACHED AT [phone]  M-F FROM 0730 TO 3:30

## 2021-02-02 LAB — MISCELLANEOUS LAB TEST #1

## 2021-02-05 NOTE — Telephone Encounter (Signed)
Requested Prescriptions     Pending Prescriptions Disp Refills   ??? carvedilol (COREG) 25 MG tablet [Pharmacy Med Name: CARVEDILOL 25 MG TABLET] 180 tablet 3     Sig: TAKE ONE TABLET BY MOUTH TWICE DAILY                  Last Office Visit: 08/08/2020     Next Office Visit:03/14/2021    Last Labs: 01/03/2021

## 2021-02-06 ENCOUNTER — Encounter: Attending: Cardiovascular Disease | Primary: Internal Medicine

## 2021-02-08 ENCOUNTER — Ambulatory Visit
Admit: 2021-02-08 | Discharge: 2021-02-08 | Payer: PRIVATE HEALTH INSURANCE | Attending: Student in an Organized Health Care Education/Training Program | Primary: Student in an Organized Health Care Education/Training Program

## 2021-02-08 DIAGNOSIS — I110 Hypertensive heart disease with heart failure: Secondary | ICD-10-CM

## 2021-02-08 MED ORDER — KETOROLAC TROMETHAMINE 30 MG/ML IJ SOLN
30 MG/ML | Freq: Once | INTRAMUSCULAR | Status: AC
Start: 2021-02-08 — End: 2021-02-08
  Administered 2021-02-08: 19:00:00 60 mg via INTRAMUSCULAR

## 2021-02-08 MED ORDER — GABAPENTIN 300 MG PO CAPS
300 MG | ORAL_CAPSULE | Freq: Three times a day (TID) | ORAL | 2 refills | Status: DC
Start: 2021-02-08 — End: 2021-08-02

## 2021-02-08 NOTE — Progress Notes (Signed)
Outpatient Clinic Established Patient Note    Patient: Gabrielle Wells  DOB: 1951-01-27 (70 y.o.)  Date: 02/08/2021    CC:  pain    HPI:      Experiencing shock like nerve pain at numerous locations. Does not describe them as emitting from spine or traveling down extremities. Gabapentin does help at night. No fevers, chills. Has had some increased weight gain and has started taking lasix daily.   No chest pain or worsening orthopnea. Has cough.     Home Meds:  Prior to Visit Medications    Medication Sig Taking? Authorizing Provider   gabapentin (NEURONTIN) 300 MG capsule Take 1 capsule by mouth 3 times daily for 90 days. Yes Josefina Do, MD   albuterol sulfate HFA (VENTOLIN HFA) 108 (90 Base) MCG/ACT inhaler Inhale 2 puffs into the lungs 4 times daily as needed for Wheezing Yes Adrian Saran, MD   hydrALAZINE (APRESOLINE) 50 MG tablet Take 1 tablet by mouth 3 times daily Yes Arshdeep Tindni, MD   hydroCHLOROthiazide (HYDRODIURIL) 25 MG tablet Take 1 tablet by mouth in the morning and at bedtime Yes Historical Provider, MD   omeprazole (PRILOSEC) 20 MG delayed release capsule Take 1 capsule by mouth every morning (before breakfast) Yes Jhonnie Garner, MD   amLODIPine (NORVASC) 10 MG tablet Take 1 tablet by mouth daily Yes Arshdeep Tindni, MD   Cholecalciferol (VITAMIN D3) 25 MCG TABS TAKE TWO TABLETS BY MOUTH DAILY Yes Hina Virk, MD   dicyclomine (BENTYL) 10 MG capsule TAKE ONE CAPSULE BY MOUTH FOUR TIMES DAILY Yes Hina Virk, MD   atorvastatin (LIPITOR) 40 MG tablet Take 1 tablet by mouth daily Yes Freddie Apley, MD   furosemide (LASIX) 20 MG tablet Take 1 tablet by mouth daily as needed (Fluid retention. Weight gain of more than 5lbs in a day) Yes Arshdeep Tindni, MD   tiZANidine (ZANAFLEX) 2 MG tablet Take 1 tablet by mouth nightly as needed (muscle spasms) Yes Shirlee More, MD   senna (SENOKOT) 8.6 MG tablet Take 1 tablet by mouth 2 times daily Yes Minna Antis, MD   acetaminophen (TYLENOL) 500 MG  tablet Take 500 mg by mouth every 6 hours as needed for Pain  Yes Historical Provider, MD   carvedilol (COREG) 25 MG tablet Take 1 tablet by mouth 2 times daily Yes Hervey Ard, MD   DULoxetine (CYMBALTA) 30 MG extended release capsule TAKE ONE CAPSULE BY MOUTH DAILY  Minna Antis, MD       Allergies:    Vancomycin, Aspirin, Mushroom extract complex, Shellfish allergy, Sulfasalazine, Acetaminophen, Betadine [povidone iodine], Ciprofloxacin, Coconut oil, Codeine, Fish-derived products, Ibuprofen, Iodine, Metronidazole, Other, Sulfa antibiotics, Amoxicillin-pot clavulanate, Coconut flavor, Eggs or egg-derived products, and Iodinated diagnostic agents    Health Maintenance Due   Topic Date Due   ??? Annual Wellness Visit (AWV)  Never done   ??? Shingles vaccine (1 of 2) Never done   ??? COVID-19 Vaccine (2 - Booster for Janssen series) 01/21/2020   ??? A1C test (Diabetic or Prediabetic)  08/03/2020   ??? Lipids  08/03/2020   ??? Depression Monitoring  09/07/2020   ??? Pneumococcal 65+ years Vaccine (2 - PCV) 09/07/2020       Immunization History   Administered Date(s) Administered   ??? COVID-19, J&J, PF, 0.5 mL 11/26/2019   ??? Influenza, Quadv, IM, PF (6 mo and older Fluzone, Flulaval, Fluarix, and 3 yrs and older Afluria) 10/15/2019   ??? Pneumococcal Polysaccharide (Pneumovax23) 09/08/2019   ???  Tdap (Boostrix, Adacel) 09/08/2019       Review of Systems    Data: Old records have been reviewed electronically.    PHYSICAL EXAM:  BP (!) 125/92 (Site: Left Lower Arm, Position: Sitting, Cuff Size: Large Adult)    Pulse 87    Temp 96.9 ??F (36.1 ??C) (Temporal)    Resp 20    Ht 5\' 3"  (1.6 m)    Wt 267 lb (121.1 kg)    SpO2 97%    BMI 47.30 kg/m??   Physical Exam  Constitutional:       General: She is not in acute distress.     Appearance: She is well-developed. She is obese.   HENT:      Head: Normocephalic and atraumatic.      Mouth/Throat:      Pharynx: No oropharyngeal exudate.   Eyes:      General:         Right eye: No discharge.          Left eye: No discharge.      Conjunctiva/sclera: Conjunctivae normal.      Pupils: Pupils are equal, round, and reactive to light.   Neck:      Thyroid: No thyromegaly.      Vascular: No JVD.   Cardiovascular:      Rate and Rhythm: Normal rate and regular rhythm.      Heart sounds: Normal heart sounds. No murmur heard.  No friction rub.   Pulmonary:      Effort: Pulmonary effort is normal. No respiratory distress.      Breath sounds: No wheezing or rales.   Abdominal:      General: There is no distension.      Palpations: Abdomen is soft.      Tenderness: There is no abdominal tenderness. There is no guarding.   Musculoskeletal:         General: Swelling ( ) and tenderness ( thoracic spine, has had since her surgery) present. No deformity. Normal range of motion.      Cervical back: Normal range of motion and neck supple.   Lymphadenopathy:      Cervical: No cervical adenopathy.   Skin:     General: Skin is warm and dry.      Coloration: Skin is not pale.      Findings: No erythema.   Neurological:      Mental Status: She is alert and oriented to person, place, and time.      Cranial Nerves: No cranial nerve deficit.      Sensory: No sensory deficit.      Motor: No abnormal muscle tone.      Coordination: Coordination normal.   Psychiatric:         Behavior: Behavior normal.         Assessment & Plan:      1. Spinal stenosis of thoracic region  - will schedule gabapentin TID  - c/w cymbalta  - referral to Physical Therapy - [place]    2. Chronic diastolic heart failure (HCC)  - increased frequency of lasix dosing  - will see cardiology tomorrow  - orthopnea not worse, lungs are clear,     3. Resistant hypertension  - controlled today    4. MGUS (monoclonal gammopathy of unknown significance)  - follows with [facility]  - hematology labs from May were reviewed  - PET pending      Return in about 3 months (around 05/11/2021).  Dispo: Pt has been staffed with Dr. Trilby Drummer  _______________  Josefina Do, MD, 02/08/2021 3:28 PM    PGY-3

## 2021-02-08 NOTE — Patient Instructions (Signed)
Patient Education        Learning About Monoclonal Gammopathy of Unknown Significance (MGUS)  What is MGUS?     Monoclonal gammopathy of unknown significance (MGUS) is a blood condition. The blood is made of many kinds of cells, including red blood cells, platelets, and white blood cells. With MGUS, a type of white blood cell called a plasma cellmakes too much of the "M" (for "monoclonal") protein in the blood.  Most people with MGUS are fine for many years. MGUS seldom causes symptoms or major health problems. In rare cases, MGUS may turn into multiple myeloma. This is a cancer of plasma cells in the blood that can cause bone weakness. Some people with MGUS may also have a higher risk for osteoporosis, in which bonesbecome thin and weak.  MGUS is sometimes seen in younger people, but is more common in people as theyget older. It's seen most often in those over the age of 62.  How is MGUS diagnosed?  MGUS is often found by chance when blood tests are done for other reasons. If you have high levels of a certain protein in your blood, your doctor may order more tests. Blood tests can help identify the protein. The protein is sometimesfound in the urine, so you may get a urine test too.  Other tests may be done if your doctor thinks you might have a medical problem. In some cases, a bone marrow biopsy may be done to rule out a problem likemultiple myeloma.  How is it treated?  Most people with MGUS don't need any treatment. But you may need regular physical exams, blood tests, and urine tests to make sure that MGUS isn'tprogressing to a medical problem.  Follow-up care is a key part of your treatment and safety. Be sure to make and go to all appointments, and call your doctor if you are having problems. It's also a good idea to know your test results and keep alist of the medicines you take.  Where can you learn more?  Go to [email or web address] and sign in to your MyChart account. Enter A917 in  the [place] box to learn more about "Learning About Monoclonal Gammopathy of Unknown Significance (MGUS)."     If you do not have an account, please click on the "Sign Up Now" link.  Current as of: July 17, 2020??????????????????????????????Content Version: 13.3  ?? 2006-2022 [employer].   Care instructions adapted under license by [facility]. If you have questions about a medical condition or this instruction, always ask your healthcare professional. [place] any warranty or liability for your use of this information.

## 2021-02-09 MED ORDER — CARVEDILOL 25 MG PO TABS
25 MG | ORAL_TABLET | ORAL | 3 refills | Status: DC
Start: 2021-02-09 — End: 2021-09-25

## 2021-02-13 ENCOUNTER — Encounter

## 2021-02-13 NOTE — Telephone Encounter (Signed)
Returned call to [facility] left message to call clinic

## 2021-02-13 NOTE — Telephone Encounter (Signed)
SHELLY FROM [facility] CALL WITH HOME HEALTH AGENCY THAT WILL TAKE INS. [place] [phone], [place].  SHELLY WANTS CALL BACK IF NOT GOING TO ORDER HOME HEALTH CARE. [place] [phone]

## 2021-02-20 ENCOUNTER — Encounter
Admit: 2021-02-20 | Discharge: 2021-02-20 | Payer: PRIVATE HEALTH INSURANCE | Attending: Nephrology | Primary: Internal Medicine

## 2021-02-20 DIAGNOSIS — I5032 Chronic diastolic (congestive) heart failure: Secondary | ICD-10-CM

## 2021-02-20 NOTE — Progress Notes (Signed)
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 female who presents to the office for evaluation of  AKI on CKD 3   Pt has CHF was on diuretics   Off now for a week   BNP 200    BP low   On high dose Neurontin           Pt denies any hx of heavy or prolonged NSAID use.   There is no hx of jaundice or hepatitis or sexually transmitted disease.   Pt has no hx of collagen vascular disease or vasculitis.  No history of dysuria or frequency.No recent procedures involving IV contrast.   There is no hx of paraprotein disease.   Pt denies any hx of recurrent UTI , incontinence or nocturia or recurrent nephrolithiasis. No unusual skin rashes .   No tea coloured urine .   Medication reviewed and noted the use of ace/arb, diuretic.    Past Medical History:        Diagnosis Date   ??? CHF (congestive heart failure) ([place])    ??? 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 GERD (gastroesophageal reflux disease)    ??? HTN (hypertension)    ??? Hyperlipidemia    ??? IBS (irritable bowel syndrome)    ??? Lumbar radiculopathy    ??? Morbid obesity ([place])    ??? Muscle fasciculation    ??? Neuropathic pain     back 2/2 to spinal surgeries   ??? SBO (small bowel obstruction) ([place]) 07/1999    with partial colectomy   ??? Spinal fracture of T8 vertebra ([facility])     s/p fusion    ??? Surgical site infection     back, spinal hardware    ??? Urinary incontinence        Past Surgical History:         Procedure Laterality Date   ??? ABDOMINAL HERNIA REPAIR  2010   ??? APPENDECTOMY     ??? BREAST LUMPECTOMY Bilateral     benign lesions   ??? CARDIAC CATHETERIZATION  2012   ??? CHOLECYSTECTOMY     ??? COLONOSCOPY     ??? COLONOSCOPY  05/26/2020    COLONOSCOPY POLYPECTOMY SNARE/COLD BIOPSY performed by Alphonse Guild, MD at [place]   ??? COLONOSCOPY  05/26/2020    COLONOSCOPY WITH BIOPSY performed by Alphonse Guild, MD at [place]   ??? CT BONE MARROW BIOPSY  01/03/2021    CT BONE MARROW BIOPSY 01/03/2021 [place] CT SCAN   ??? HYSTERECTOMY, TOTAL ABDOMINAL (CERVIX REMOVED)     ???  SHOULDER ARTHROPLASTY Right 2010   ??? TOTAL KNEE ARTHROPLASTY Bilateral 2009   ??? UPPER GASTROINTESTINAL ENDOSCOPY N/A 05/26/2020    EGD BIOPSY performed by Alphonse Guild, MD at [facility] ENDOSCOPY       Current Medications:    Current Outpatient Medications   Medication Sig Dispense Refill   ??? carvedilol (COREG) 25 MG tablet TAKE ONE TABLET BY MOUTH TWICE DAILY 180 tablet 3   ??? gabapentin (NEURONTIN) 300 MG capsule Take 1 capsule by mouth 3 times daily for 90 days. 90 capsule 2   ??? albuterol sulfate HFA (VENTOLIN HFA) 108 (90 Base) MCG/ACT inhaler Inhale 2 puffs into the lungs 4 times daily as needed for Wheezing 18 g 0   ??? hydrALAZINE (APRESOLINE) 50 MG tablet Take 1 tablet by mouth 3 times daily 90 tablet 5   ??? hydroCHLOROthiazide (HYDRODIURIL) 25 MG tablet Take 1 tablet by mouth in the morning and at bedtime     ??? omeprazole (PRILOSEC) 20 MG delayed release capsule Take 1 capsule by mouth every morning (before breakfast) 90 capsule 1   ??? amLODIPine (NORVASC) 10 MG tablet Take 1 tablet by mouth daily 30 tablet 5   ??? Cholecalciferol (VITAMIN D3) 25 MCG TABS TAKE TWO TABLETS BY MOUTH DAILY 60 tablet 2   ??? DULoxetine (CYMBALTA) 30 MG extended release capsule TAKE ONE CAPSULE BY MOUTH DAILY 30 capsule 2   ??? dicyclomine (BENTYL) 10 MG capsule TAKE ONE CAPSULE BY MOUTH FOUR TIMES DAILY 120 capsule 0   ??? atorvastatin (LIPITOR) 40 MG tablet Take 1 tablet by mouth  daily 90 tablet 1   ??? furosemide (LASIX) 20 MG tablet Take 1 tablet by mouth daily as needed (Fluid retention. Weight gain of more than 5lbs in a day) 30 tablet 3   ??? tiZANidine (ZANAFLEX) 2 MG tablet Take 1 tablet by mouth nightly as needed (muscle spasms) 90 tablet 3   ??? senna (SENOKOT) 8.6 MG tablet Take 1 tablet by mouth 2 times daily 60 tablet 11   ??? acetaminophen (TYLENOL) 500 MG tablet Take 500 mg by mouth every 6 hours as needed for Pain        No current facility-administered medications for this visit.       Allergies:  Vancomycin, Aspirin, Mushroom extract complex, Shellfish allergy, Sulfasalazine, Acetaminophen, Betadine [povidone iodine], Ciprofloxacin, Coconut oil, Codeine, Fish-derived products, Ibuprofen, Iodine, Metronidazole, Other, Sulfa antibiotics, Amoxicillin-pot clavulanate, Coconut flavor, Eggs or egg-derived products, and Iodinated diagnostic agents    Social History:   Social History     Socioeconomic History   ??? Marital status: Single     Spouse name: Not on file   ??? Number of children: Not on file   ??? Years of education: Not on file   ??? Highest education level: Not on file   Occupational History   ??? Not on file   Tobacco Use   ??? Smoking status: Never Smoker   ??? Smokeless tobacco: Never Used   Vaping Use   ??? Vaping Use: Never used   Substance and Sexual Activity   ??? Alcohol use: Never   ??? Drug use: Never   ??? Sexual activity: Not on file   Other Topics Concern   ??? Not on file   Social History Narrative   ??? Not on file     Social Determinants of Health     Financial Resource Strain:    ??? Difficulty of Paying Living Expenses: Not on file   Food Insecurity:    ???  Worried About Charity fundraiser in the Last Year: Not on file   ??? Ran Out of Food in the Last Year: Not on file   Transportation Needs:    ??? Lack of Transportation (Medical): Not on file   ??? Lack of Transportation (Non-Medical): Not on file   Physical Activity:    ??? Days of Exercise per Week: Not on file   ??? Minutes of Exercise per  Session: Not on file   Stress:    ??? Feeling of Stress : Not on file   Social Connections:    ??? Frequency of Communication with Friends and Family: Not on file   ??? Frequency of Social Gatherings with Friends and Family: Not on file   ??? Attends Religious Services: Not on file   ??? Active Member of Clubs or Organizations: Not on file   ??? Attends Archivist Meetings: Not on file   ??? Marital Status: Not on file   Intimate Partner Violence:    ??? Fear of Current or Ex-Partner: Not on file   ??? Emotionally Abused: Not on file   ??? Physically Abused: Not on file   ??? Sexually Abused: Not on file   Housing Stability:    ??? Unable to Pay for Housing in the Last Year: Not on file   ??? Number of Places Lived in the Last Year: Not on file   ??? Unstable Housing in the Last Year: Not on file       Family History:   Family History   Problem Relation Age of Onset   ??? Cancer Paternal Grandmother         unk        Review of Systems:    Constitutional: No fever, no chills, no lethargy, no weakness.  HEENT:  No headache, otalgia, itchy eyes, nasal discharge or sore throat.  Cardiac:  No chest pain, dyspnea, orthopnea or PND.  Chest:  No cough, phlegm or wheezing.  Abdomen:  No abdominal pain, nausea or vomiting.  Neuro:  No focal weakness, abnormal movements orseizure like activity.  Skin:   No rashes, no itching.  GU:   No hematuria, no pyuria, no dysuria, no flank pain.  Extremities:  No swelling or joint pains.      Objective:  BP 138/88    Pulse 62    Wt 267 lb (121.1 kg)    BMI 47.30 kg/m??     Physical Exam:  General appearance:Awake, alert, in no acute distress  Skin: warm and dry, no rash or erythema  Eyes: conjunctivae normal and sclera anicteric  ENT: :no thrush no pharyngeal congestion    Neck: without any JVD. No carotid bruits or thyromegaly. No  Lymphadenopathty:  Pulmonary: lungs are clear and without any wheezing or rhonchi   Cardiovascular:Normal S1 & S2, No S3 or  S4, No  Pericardial rub , No Murmur Abdomen: soft  nontender, bowel sounds present, no organomegaly,  No ascites  Extremities: no cyanosis, clubbing or edema    Labs:    CBC:   Lab Results   Component Value Date/Time    WBC 4.4 01/03/2021 07:41 AM    RBC 5.31 01/03/2021 07:41 AM    HGB 15.0 01/03/2021 07:41 AM    HCT 46.1 01/03/2021 07:41 AM    MCV 86.8 01/03/2021 07:41 AM    RDW 13.9 01/03/2021 07:41 AM    PLT 122 01/03/2021 07:41 AM    MPV 9.5 01/03/2021 07:41 AM  BMP:   Lab Results   Component Value Date/Time    NA 140 06/12/2020 11:47 AM    NA 139 06/08/2020 04:19 PM    NA 137 05/08/2020 05:20 PM    K 4.9 06/12/2020 11:47 AM    K 4.0 06/08/2020 04:19 PM    K 6.0 05/08/2020 05:20 PM    K 4.0 04/18/2020 02:00 PM    K 4.2 03/07/2020 03:37 PM    K 4.7 10/26/2019 07:44 PM    CL 103 06/12/2020 11:47 AM    CL 102 06/08/2020 04:19 PM    CL 100 05/08/2020 05:20 PM    CO2 25 06/12/2020 11:47 AM    CO2 23 06/08/2020 04:19 PM    CO2 26 05/08/2020 05:20 PM    BUN 8 06/12/2020 11:47 AM    BUN 9 06/08/2020 04:19 PM    BUN 10 05/08/2020 05:20 PM    CREATININE 0.9 06/12/2020 11:47 AM    CREATININE 1.1 06/08/2020 04:19 PM    CREATININE 0.9 05/08/2020 05:20 PM    GLUCOSE 106 06/12/2020 11:47 AM    GLUCOSE 115 06/08/2020 04:19 PM    GLUCOSE 102 05/08/2020 05:20 PM    CALCIUM 8.8 06/12/2020 11:47 AM    CALCIUM 9.1 06/08/2020 04:19 PM    CALCIUM 9.1 05/08/2020 05:20 PM      BNP:No results found for: BNP  PHOSPHORUS:    Lab Results   Component Value Date/Time    PHOS 3.3 04/18/2020 02:00 PM    PHOS 2.9 03/07/2020 03:37 PM    PHOS 3.0 10/12/2019 05:22 PM     MAGNESIUM: No results found for: MG  ALBUMIN:   Lab Results   Component Value Date/Time    LABALBU 4.0 06/12/2020 11:47 AM     IRON:  No results found for: IRON  IRON SATURATION:  No results found for: LABIRON  TIBC:  No results found for: TIBC  FERRITIN:  No results found for: FERRITIN  ANA: No results found for: ANA    SPEP:   Lab Results   Component Value Date/Time    PROT 7.7 06/12/2020 11:47 AM     UPEP: No results found  for: TPU   HEPBSAG:No results found for: HEPBSAG  HEPCAB:No results found for: HEPCAB  C3: No results found for: C3  C4: No results found for: C4  MPO ANCA: No results found for: MPO .  PR3 ANCA:  No results found for: PR3  PTH: No results found for: PTH    Urine Creatinine:    Lab Results   Component Value Date/Time    LABCREA 446.6 03/07/2020 03:48 PM     Urine Eosinophils: No results found for: UREO  Urine Protein:  No results found for: TPU  Urinalysis:  U/A:   Lab Results   Component Value Date/Time    NITRU Negative 06/12/2020 04:28 PM    COLORU Yellow 06/12/2020 04:28 PM    PHUR 6.0 06/12/2020 04:28 PM    WBCUA 3-5 06/12/2020 04:28 PM    RBCUA 0-2 06/12/2020 04:28 PM    BACTERIA Rare 06/12/2020 04:28 PM    CLARITYU Clear 06/12/2020 04:28 PM    SPECGRAV 1.025 06/12/2020 04:28 PM    LEUKOCYTESUR SMALL 06/12/2020 04:28 PM    UROBILINOGEN 0.2 06/12/2020 04:28 PM    BILIRUBINUR Negative 06/12/2020 04:28 PM    BLOODU Negative 06/12/2020 04:28 PM    GLUCOSEU Negative 06/12/2020 04:28 PM    KETUA Negative 06/12/2020 04:28 PM    AMORPHOUS  Rare 03/07/2020 03:49 PM     Wt Readings from Last 3 Encounters:   02/20/21 267 lb (121.1 kg)   02/08/21 267 lb (121.1 kg)   01/03/21 252 lb (114.3 kg)       Radiology:  Reviewed as available.    Assessment        Plan:    AKI   CHF   HTN   Neuropathy   Vit D def   IBS  MGUS       Plan  - GFR stable  - BP controlled   - Pt had bone marrow biopsy and was diagnosed with MGUS   - AKI sec to vol changes   - EF nl   - BNP 202   - on lasix as needed    - dec Neurontin   - GFR - 20--->60--->54     - CHF stable       - Avoid any NSAIDS       Thank you for the consultation.  Please do not hesitate to call with questions.

## 2021-02-21 NOTE — Telephone Encounter (Signed)
FYIManuela Wells from [facility] called stating patient is going out of town with daughter will possibly start PT and nurse visits next week.

## 2021-03-07 NOTE — Telephone Encounter (Signed)
Requested Prescriptions     Pending Prescriptions Disp Refills    hydroCHLOROthiazide (HYDRODIURIL) 25 MG tablet [Pharmacy Med Name: HYDROCHLOROTHIAZIDE 25 MG TAB*] 15 tablet 0     Sig: TAKE ONE-HALF TABLET BY MOUTH DAILY          Last Office Visit: 08/08/2020     Next Office Visit: 03/14/21

## 2021-03-08 MED ORDER — HYDROCHLOROTHIAZIDE 25 MG PO TABS
25 MG | ORAL_TABLET | ORAL | 0 refills | Status: DC
Start: 2021-03-08 — End: 2021-08-02

## 2021-03-14 ENCOUNTER — Ambulatory Visit
Admit: 2021-03-14 | Discharge: 2021-03-14 | Payer: PRIVATE HEALTH INSURANCE | Attending: Cardiovascular Disease | Primary: Student in an Organized Health Care Education/Training Program

## 2021-03-14 DIAGNOSIS — I5032 Chronic diastolic (congestive) heart failure: Secondary | ICD-10-CM

## 2021-03-14 NOTE — Progress Notes (Signed)
Cc: Resistant HTN, HFpEF    HPI:     Gabrielle Wells is a 70 y.o. female with a past medical history of morbid obesity, resistant HTN, HFpEF, CKD, GERD, MGUS.       Patient was admitted at [facility] 2/22 - 10/15/19 for dehydration, AKI, sinus bradycardia (due to BB toxicity in the setting of AKI).      Echo 09/2019: nl LV, EF 08-67%, normal diastolic, normal valves and RV.     ECG 10/11/19: sinus brady 55, nonspecific changes, low voltage.     Renal duplex 09/2019: no RAS, indeterminate hypoechoic lesion R kidney.     Urine metanephrines w/ high norepi, normal cortisol, renin and aldosterone.      FLP 07/2019: TC 224, HDL 62, LDL 137, TG 124 off statin.    Lexi 10/2020: normal.     Patient is here for a follow up. She had a bone marrow biopsy and was diagnosed with MGUS. She reports no complaints.       Histories     Past Medical History:   has a past medical history of CHF (congestive heart failure) ([place]), Epidural abscess, GERD (gastroesophageal reflux disease), HTN (hypertension), Hyperlipidemia, IBS (irritable bowel syndrome), Lumbar radiculopathy, Morbid obesity ([place]), Muscle fasciculation, Neuropathic pain, SBO (small bowel obstruction) ([place]), Spinal fracture of T8 vertebra ([place]), Surgical site infection, and Urinary incontinence.    Surgical History:   has a past surgical history that includes Appendectomy; Breast lumpectomy (Bilateral); Hysterectomy, total abdominal; Cholecystectomy; Total knee arthroplasty (Bilateral, 2009); Cardiac catheterization (2012); Total shoulder arthroplasty (Right, 2010); Abdominal hernia repair (2010); Colonoscopy; Upper gastrointestinal endoscopy (N/A, 05/26/2020); Colonoscopy (05/26/2020); Colonoscopy (05/26/2020); and CT BIOPSY BONE MARROW (01/03/2021).     Social History:   reports that she has never smoked. She has never used smokeless tobacco. She reports that she does not drink alcohol and does not use drugs.     Family History:  No evidence for sudden cardiac death or premature  CAD      Medications:     Home medications were reviewed and are listed below    Prior to Admission medications    Medication Sig Start Date End Date Taking? Authorizing Provider   hydroCHLOROthiazide (HYDRODIURIL) 25 MG tablet TAKE ONE-HALF TABLET BY MOUTH DAILY 03/08/21  Yes Hervey Ard, MD   carvedilol (COREG) 25 MG tablet TAKE ONE TABLET BY MOUTH TWICE DAILY 02/09/21  Yes Hervey Ard, MD   gabapentin (NEURONTIN) 300 MG capsule Take 1 capsule by mouth 3 times daily for 90 days. 02/08/21 05/09/21 Yes Josefina Do, MD   albuterol sulfate HFA (VENTOLIN HFA) 108 (90 Base) MCG/ACT inhaler Inhale 2 puffs into the lungs 4 times daily as needed for Wheezing 12/14/20  Yes Adrian Saran, MD   hydrALAZINE (APRESOLINE) 50 MG tablet Take 1 tablet by mouth 3 times daily 12/07/20  Yes Arshdeep Tindni, MD   omeprazole (PRILOSEC) 20 MG delayed release capsule Take 1 capsule by mouth every morning (before breakfast) 11/28/20  Yes Jhonnie Garner, MD   amLODIPine (NORVASC) 10 MG tablet Take 1 tablet by mouth daily 11/16/20  Yes Arshdeep Tindni, MD   Cholecalciferol (VITAMIN D3) 25 MCG TABS TAKE TWO TABLETS BY MOUTH DAILY 10/26/20  Yes Hina Virk, MD   DULoxetine (CYMBALTA) 30 MG extended release capsule TAKE ONE CAPSULE BY MOUTH DAILY 10/26/20  Yes Hina Virk, MD   dicyclomine (BENTYL) 10 MG capsule TAKE ONE CAPSULE BY MOUTH FOUR TIMES DAILY 10/26/20  Yes Minna Antis, MD  atorvastatin (LIPITOR) 40 MG tablet Take 1 tablet by mouth daily 10/04/20  Yes Freddie Apley, MD   furosemide (LASIX) 20 MG tablet Take 1 tablet by mouth daily as needed (Fluid retention. Weight gain of more than 5lbs in a day) 08/22/20  Yes Arshdeep Tindni, MD   tiZANidine (ZANAFLEX) 2 MG tablet Take 1 tablet by mouth nightly as needed (muscle spasms) 08/02/20  Yes Shirlee More, MD   senna (SENOKOT) 8.6 MG tablet Take 1 tablet by mouth 2 times daily 06/19/20 06/19/21 Yes Minna Antis, MD   acetaminophen (TYLENOL) 500 MG tablet Take 500 mg by mouth every 6 hours as  needed for Pain    Yes Historical Provider, MD          Allergy:     Vancomycin, Aspirin, Mushroom extract complex, Shellfish allergy, Sulfasalazine, Acetaminophen, Betadine [povidone iodine], Ciprofloxacin, Coconut oil, Codeine, Fish-derived products, Ibuprofen, Iodine, Metronidazole, Other, Sulfa antibiotics, Amoxicillin-pot clavulanate, Coconut flavor, Eggs or egg-derived products, and Iodinated diagnostic agents       Review of Systems:     All 12 point review of symptoms completed. Pertinent positives identified in the HPI, all other review of symptoms negative as below.    CONSTITUTIONAL: No fatigue  SKIN: No rash or pruritis.  EYES: No visual changes or diplopia. No scleral icterus.  ENT: No Headaches, hearing loss or vertigo. No mouth sores or sore throat.  CARDIOVASCULAR: No chest pain/chest pressure/chest discomfort. No palpitations. No edema.   RESPIRATORY: No dyspnea. No cough or wheezing, no sputum production.  GASTROINTESTINAL: No N/V/D. No abdominal pain, appetite loss, blood in stools.  GENITOURINARY: No dysuria, trouble voiding, or hematuria.  MUSCULOSKELETAL:  No gait disturbance, weakness or joint complaints.  NEUROLOGICAL: No headache, diplopia, change in muscle strength, numbness or tingling. No change in gait, balance, coordination, mood, affect, memory, mentation, behavior.  PSHYCH: No anxiety, loss of interest, change in sexual behavior, feelings of self-harm, or confusion.  ENDOCRINE: No excessive thirst, fluid intake, or urination. No tremor.  HEMATOLOGIC: No abnormal bruising or bleeding.  ALLERGY: No nasal congestion or hives.      Physical Examination:     Vitals:    03/14/21 1529 03/14/21 1532   BP: (!) 138/92 (!) 138/92   Pulse: 92    Weight: 265 lb 6.4 oz (120.4 kg)        Wt Readings from Last 3 Encounters:   03/14/21 265 lb 6.4 oz (120.4 kg)   02/20/21 267 lb (121.1 kg)   02/08/21 267 lb (121.1 kg)         General Appearance:  Alert, cooperative, no distress, appears stated age  Appropriate weight   Head:  Normocephalic, without obvious abnormality, atraumatic   Eyes:  PERRL, conjunctiva/corneas clear EOM intact  Ears normal   Throat no lesions       Nose: Nares normal, no drainage or sinus tenderness   Throat: Lips, mucosa, and tongue normal   Neck: Supple, symmetrical, trachea midline, no adenopathy, thyroid: not enlarged, symmetric, no tenderness/mass/nodules, no carotid bruit       Lungs:   Clear to auscultation bilaterally, respirations unlabored   Chest Wall:  No tenderness or deformity   Heart:  Regular rhythm, rate is controlled, S1, S2 normal, there is no murmur, there is no rub or gallop, cannot assess jvd, no bilateral lower extremity edema   Abdomen:   Soft, non-tender, bowel sounds active all four quadrants,  no masses, no organomegaly       Extremities:  Extremities normal, atraumatic, no cyanosis   Pulses: 2+ and symmetric   Skin: Skin color, texture, turgor normal, no rashes or lesions   Pysch: Normal mood and affect   Neurologic: Normal gross motor and sensory exam.  Cranial nerves intact        Labs:     Lab Results   Component Value Date    WBC 4.4 01/03/2021    HGB 15.0 01/03/2021    HCT 46.1 01/03/2021    MCV 86.8 01/03/2021    PLT 122 (L) 01/03/2021     Lab Results   Component Value Date    NA 140 06/12/2020    K 4.9 06/12/2020    CL 103 06/12/2020    CO2 25 06/12/2020    BUN 8 06/12/2020    CREATININE 0.9 06/12/2020    GLUCOSE 106 (H) 06/12/2020    CALCIUM 8.8 06/12/2020    PROT 7.7 06/12/2020    LABALBU 4.0 06/12/2020    BILITOT <0.2 06/12/2020    ALKPHOS 63 06/12/2020    AST 23 06/12/2020    ALT 8 (L) 06/12/2020    LABGLOM >60 06/12/2020    GFRAA >60 06/12/2020    AGRATIO 1.0 (L) 05/08/2020    GLOB 4.2 05/08/2020         Lab Results   Component Value Date    CHOL 224 (H) 08/04/2019     Lab Results   Component Value Date    TRIG 124 08/04/2019     Lab Results   Component Value Date    HDL 62 (H) 08/04/2019     Lab Results   Component Value Date    LDLCALC 137 (H)  08/04/2019     Lab Results   Component Value Date    LABVLDL 25 08/04/2019     No results found for: [facility]    Lab Results   Component Value Date    INR 0.95 01/03/2021    INR 1.03 10/11/2019    PROTIME 10.7 01/03/2021    PROTIME 12.0 10/11/2019       The 10-year ASCVD risk score Mikey Bussing DC Jr., et al., 2013) is: 16.1%    Values used to calculate the score:      Age: 36 years      Sex: Female      Is Non-Hispanic African American: Yes      Diabetic: No      Tobacco smoker: No      Systolic Blood Pressure: 194 mmHg      Is BP treated: Yes      HDL Cholesterol: 62 mg/dL      Total Cholesterol: 224 mg/dL      Assessment / Plan:      Diagnosis Orders   1. Chronic diastolic heart failure ([place])        2. Resistant hypertension             1. Resistant HTN:  Patient reports compliance with meds and diet. It could be secondary HTN (elevated norepinephrine per urine test). She follows with Dr Silvestre Moment, nephrologist. BP is controlled.      -Will defer to PCP vs endocrinologist further investigation regarding abnormal norepi levels.   -Cw coreg 25 bid  -Off ARB due to AKI on CKD  -Cw hydralazine 50 mg bid (will not increase to 100 bid due to causing severe hypotension).  -Cw amlodipine 5 mg and HCTZ 25 daily.   -Low salt diet     2. Chronic HFpEF:  Patient appears euvolemic.      -BP control as above.  -Low salt diet  -On lasix 20 daily prn edema.      3. Exertional dypnea and fatigue:  Lexi nuc stress test excluded ischemia.     -Conservative approach     4. HLP:   Patient was started on lipitor by PCP.     -Repeat FLP, CPK and ALT         We will schedule a follow up visit in 6 months      I have spent 35 minutes of face to face time with the patient with more than 50% spent counseling and coordinating care.       I have personally reviewed the reports and images of labs, radiological studies, cardiac studies including ECG's and telemetry, current and old medical records. The note was completed using EMR and Dragon  dictation system. Every effort was made to ensure accuracy; however, inadvertent computerized transcription errors may be present.    All questions and concerns were addressed to the patient/family. Alternatives to my treatment were discussed.     I would like to thank you for providing me the opportunity to participate in the care of your patient. If you have any questions, please do not hesitate to contact me.     Hervey Ard, MD, [facility], [place]  [address]  [place] [address]  Main Office Phone: [phone]  Fax: [phone]

## 2021-03-29 NOTE — Telephone Encounter (Signed)
Returned call to [place] who stated patient is fine [place]

## 2021-03-29 NOTE — Telephone Encounter (Signed)
[place] NURSE LVM STATING PT'S BP TODAY WAS 160/92. PT HAD SLIGHT H/A AND TIGHTNESS WHEN BREATHING. [place] [phone]

## 2021-04-23 ENCOUNTER — Encounter

## 2021-04-23 ENCOUNTER — Emergency Department
Admit: 2021-04-24 | Payer: PRIVATE HEALTH INSURANCE | Primary: Student in an Organized Health Care Education/Training Program

## 2021-04-23 DIAGNOSIS — R0789 Other chest pain: Secondary | ICD-10-CM

## 2021-04-23 NOTE — Unmapped (Signed)
brYour patient was seen at a [facility]. Please go to [email or web address] to view information filed to your patient's chart in Epic.    If you need to view your patient's results prior to gaining access to Epic CareLink, please contact the [facility] where your patient was seen.          Veronica, Winters #[id number] ([id number]) (70 y.o. F) PCP: Jonathon Resides [phone])        A12-12              ED Arrival Information     Expected    -     Arrival    04/23/2021 10:15 PM     Acuity    2-Emergent             Means of arrival    Ambulance     Escorted by    Other     Service    Emergency Medicine     Admission type    Emergency             Arrival complaint    Chest pain           bmk  Chief Complaint     Complaint Comment    Chest Pain Pain that goes under her left breast and into her left axilla. States it feels like a pressure. Pain started about 2100 hours. Pain is an 8 of 10. States she cannot take Asprin due to kidney failure. In stage 4 kidney failure.         ED Vitals    Date/Time Temp Pulse Resp BP SpO2 Weight Who   04/24/21 0200 -- 90 15 146/91 96 % -- [facility]   04/23/21 2230 -- 103 24 177/94 97 % -- [facility]   04/23/21 2223 99.2 ?F (37.3 ?C) 109 22 173/96 98 % 268 lb (121.6 kg) GH        bmk  Allergies (Fully Reviewed on: 04/23/21)     Agent Severity Comments    Vancomycin High Drug induced NEUTROPENIA  Drug induced NEUTROPENIA  Drug induced NEUTROPENIA      Aspirin Medium Stomach bleeding  Stomach bleeding  Stomach bleeding      Mushroom Extract Complex Medium     Shellfish Allergy Medium     Sulfasalazine Medium     Acetaminophen      Betadine [Povidone Iodine]      Ciprofloxacin  Made bottom raw  Made bottom raw      Coconut Oil      Codeine      Fish-derived Products      Ibuprofen      Iodine      Metronidazole  Made bottom raw  Made bottom raw      Other  detergents    Sulfa Antibiotics      Amoxicillin-pot Clavulanate Low Has never had problems  with amoxicillin/ penicillin    HAS TOLERATED ZOSYN WITH ZERO PROBLEMS  Has never had problems with amoxicillin/ penicillin    HAS TOLERATED ZOSYN WITH ZERO PROBLEMS      Coconut Flavor Low     Eggs Or Egg-derived Products Low     Iodinated Diagnostic Agents Low Takes Benadryl 50mg  PO before receiving iodinated contrast  Takes Benadryl 50mg  PO before receiving iodinated contrast  Takes Benadryl 50mg  PO before receiving iodinated contrast  Takes Benadryl 50mg  PO before receiving iodinated contrast          bmk  Medical History  Past Medical History       Diagnosis  Date  Comments      CHF (congestive heart failure) (HCC) [I50.9]          HTN (hypertension) [I10]          Neuropathic 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]    back 2/2 to spinal surgeries      Lumbar radiculopathy [M54.16]          Spinal fracture of T8 vertebra (HCC) [S22.069A]    s/p fusion       Surgical site infection [T81.49XA]    back, spinal hardware       IBS (irritable bowel syndrome) [K58.9]          GERD (gastroesophageal reflux disease) [K21.9]          SBO (small bowel obstruction) (HCC) [K56.609]  07/1999  with partial colectomy      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  s/i I&D and removaal of hardware      Muscle fasciculation [R25.3]          Urinary incontinence [R32]          Morbid obesity (HCC) [E66.01]          Hyperlipidemia [E78.5]                  Surgical History     Past Surgical History         Laterality  Date  Comments    Appendectomy [SHX54]          Breast lumpectomy [SHX2]  Bilateral    benign lesions    Hysterectomy, total abdominal [SHX209]          Cholecystectomy [SHX55]          Total knee arthroplasty [ZOX096]  Bilateral  2009      Cardiac catheterization [SHX172]    2012      Total shoulder arthroplasty [SHX126]  Right  2010      Abdominal hernia repair [EAV409]    2010      Colonoscopy [WJX914]          Upper gastrointestinal endoscopy [NWG956]  N/A  05/26/2020  EGD BIOPSY performed by Adella Nissen, MD at [facility] ENDOSCOPY    Colonoscopy  [OZH086]    05/26/2020  COLONOSCOPY POLYPECTOMY SNARE/COLD BIOPSY performed by Adella Nissen, MD at [facility] ENDOSCOPY    Colonoscopy [VHQ469]    05/26/2020  COLONOSCOPY WITH BIOPSY performed by Adella Nissen, MD at [facility] ENDOSCOPY    CT BIOPSY BONE MARROW [[id number]]    01/03/2021  CT BONE MARROW BIOPSY 01/03/2021 [facility] CT SCAN              Obstetric History    No obstetric history on file.     ED Provider Notes     Toma Copier, MD 04/24/2021 4:55 AM                 THE [facility]  EMERGENCY DEPARTMENT ENCOUNTER          ATTENDING PHYSICIAN NOTE       Date of evaluation: 04/23/2021    Chief Complaint     Chest Pain (Pain that goes under her left breast and into her left axilla. States it feels like a pressure. Pain started about 2100 hours. Pain is an 8 of 10. States she cannot take Asprin due to kidney failure. In stage 4 kidney failure. )      History of Present Illness  Veronica Winters is a 70 y.o. female who presents with a chief complaint of chest pain. Patient states about 3 to 4 hours ago she was sitting in her recliner watching TV when all of a sudden she had onset of pain underneath her left arm. Over the next 30   minutes she feels like the pain started to radiate underneath her left breast into the front of her left chest. Pain is currently an 8 out of 10 and has been constant with no specific alleviating or exacerbating factors. Not associated with shortness of   breath. Associated with some nausea but no vomiting. No fevers. No body aches. No sick contacts. States she cannot take aspirin due to stage IV kidney failure. She does not have a history of coronary artery disease but does have a history of chronic   congestive heart failure and hypertension, last echo in 2021 with an EF of 60 to 65%. Patient states she has never had pain like she is having currently before. No history of blood clots.    Review of Systems     Review of Systems   Constitutional: Negative for activity change, appetite change, chills  and fever.   HENT: Negative for congestion, ear pain, facial swelling, nosebleeds, rhinorrhea and sore throat.   Eyes: Negative for photophobia, pain, discharge, itching and visual disturbance.   Respiratory: Negative for cough, chest tightness, shortness of breath and wheezing.   Cardiovascular: Positive for chest pain. Negative for palpitations and leg swelling.   Gastrointestinal: Positive for nausea. Negative for abdominal pain, diarrhea and vomiting.   Endocrine: Negative for cold intolerance, heat intolerance, polydipsia and polyuria.   Genitourinary: Negative for dysuria, flank pain, hematuria, pelvic pain, vaginal bleeding and vaginal discharge.   Musculoskeletal: Negative for arthralgias, back pain, joint swelling, myalgias and neck pain.   Skin: Negative for rash and wound.   Allergic/Immunologic: Negative for environmental allergies.   Neurological: Negative for dizziness, tremors, seizures, syncope, weakness, light-headedness and headaches.   Hematological: Negative for adenopathy. Does not bruise/bleed easily.   Psychiatric/Behavioral: Negative for confusion and hallucinations.      Past Medical, Surgical, Family, and Social History     She has a past medical history of CHF (congestive heart failure) (HCC), Epidural abscess, GERD (gastroesophageal reflux disease), HTN (hypertension), Hyperlipidemia, IBS (irritable bowel syndrome), Lumbar radiculopathy, Morbid obesity (HCC), Muscle   fasciculation, Neuropathic pain, SBO (small bowel obstruction) (HCC), Spinal fracture of T8 vertebra (HCC), Surgical site infection, and Urinary incontinence.  She has a past surgical history that includes Appendectomy; Breast lumpectomy (Bilateral); Hysterectomy, total abdominal; Cholecystectomy; Total knee arthroplasty (Bilateral, 2009); Cardiac catheterization (2012); Total shoulder arthroplasty (Right,   2010); Abdominal hernia repair (2010); Colonoscopy; Upper gastrointestinal endoscopy (N/A, 05/26/2020); Colonoscopy  (05/26/2020); Colonoscopy (05/26/2020); and CT BIOPSY BONE MARROW (01/03/2021).  Her family history includes Cancer in her paternal grandmother.  She reports that she has never smoked. She has never used smokeless tobacco. She reports that she does not drink alcohol and does not use drugs.    Medications     Discharge Medication List as of 04/24/2021 1:55 AM        CONTINUE these medications which have NOT CHANGED    Details   hydroCHLOROthiazide (HYDRODIURIL) 25 MG tablet TAKE ONE-HALF TABLET BY MOUTH DAILY, Disp-15 tablet, R-0Normal      carvedilol (COREG) 25 MG tablet TAKE ONE TABLET BY MOUTH TWICE DAILY, Disp-180 tablet, R-3Normal      gabapentin (NEURONTIN) 300 MG capsule Take 1  capsule by mouth 3 times daily for 90 days., Disp-90 capsule, R-2Print      albuterol sulfate HFA (VENTOLIN HFA) 108 (90 Base) MCG/ACT inhaler Inhale 2 puffs into the lungs 4 times daily as needed for Wheezing, Disp-18 g, R-0Normal      hydrALAZINE (APRESOLINE) 50 MG tablet Take 1 tablet by mouth 3 times daily, Disp-90 tablet, R-5Normal      omeprazole (PRILOSEC) 20 MG delayed release capsule Take 1 capsule by mouth every morning (before breakfast), Disp-90 capsule, R-1Normal      amLODIPine (NORVASC) 10 MG tablet Take 1 tablet by mouth daily, Disp-30 tablet, R-5Normal      Cholecalciferol (VITAMIN D3) 25 MCG TABS TAKE TWO TABLETS BY MOUTH DAILY, Disp-60 tablet, R-2Normal      DULoxetine (CYMBALTA) 30 MG extended release capsule TAKE ONE CAPSULE BY MOUTH DAILY, Disp-30 capsule, R-2Normal      dicyclomine (BENTYL) 10 MG capsule TAKE ONE CAPSULE BY MOUTH FOUR TIMES DAILY, Disp-120 capsule, R-0Normal      atorvastatin (LIPITOR) 40 MG tablet Take 1 tablet by mouth daily, Disp-90 tablet, R-1Normal      furosemide (LASIX) 20 MG tablet Take 1 tablet by mouth daily as needed (Fluid retention. Weight gain of more than 5lbs in a day), Disp-30 tablet, R-3Normal      tiZANidine (ZANAFLEX) 2 MG tablet Take 1 tablet by mouth nightly as needed (muscle  spasms), Disp-90 tablet, R-3Normal      senna (SENOKOT) 8.6 MG tablet Take 1 tablet by mouth 2 times daily, Disp-60 tablet, R-11Normal      acetaminophen (TYLENOL) 500 MG tablet Take 500 mg by mouth every 6 hours as needed for Pain Historical Med             Allergies     She is allergic to vancomycin, aspirin, mushroom extract complex, shellfish allergy, sulfasalazine, acetaminophen, betadine [povidone iodine], ciprofloxacin, coconut oil, codeine, fish-derived products, ibuprofen, iodine, metronidazole, other, sulfa   antibiotics, amoxicillin-pot clavulanate, coconut flavor, eggs or egg-derived products, and iodinated diagnostic agents.    Physical Exam     INITIAL VITALS: BP: (!) 173/96, Temp: 99.2 ?F (37.3 ?C), Heart Rate: (!) 109, Resp: 22, SpO2: 98 %   Physical Exam  Constitutional:    General: She is not in acute distress.   Appearance: She is well-developed. She is not diaphoretic.   HENT:    Head: Normocephalic and atraumatic.    Mouth/Throat:    Mouth: Mucous membranes are moist.    Pharynx: No oropharyngeal exudate.   Eyes:    General:    Right eye: No discharge.    Left eye: No discharge.    Conjunctiva/sclera: Conjunctivae normal.    Pupils: Pupils are equal, round, and reactive to light.   Neck:    Thyroid: No thyromegaly.    Vascular: No JVD.    Trachea: No tracheal deviation.   Cardiovascular:    Rate and Rhythm: Regular rhythm. Tachycardia present.    Heart sounds: Normal heart sounds. No murmur heard.   No friction rub. No gallop.   Pulmonary:    Effort: Pulmonary effort is normal. No respiratory distress.    Breath sounds: Normal breath sounds. No stridor. No wheezing or rales.   Chest:    Chest wall: No tenderness.   Abdominal:    General: Bowel sounds are normal. There is no distension.    Palpations: Abdomen is soft.    Tenderness: There is no abdominal tenderness. There is no guarding or rebound.  Musculoskeletal:    General: No tenderness or deformity. Normal range of motion.    Cervical  back: Normal range of motion and neck supple.   Lymphadenopathy:    Cervical: No cervical adenopathy.   Skin:   General: Skin is warm and dry.    Capillary Refill: Capillary refill takes less than 2 seconds.    Findings: No erythema or rash.   Neurological:    General: No focal deficit present.    Mental Status: She is alert and oriented to person, place, and time.    Cranial Nerves: No cranial nerve deficit.    Coordination: Coordination normal.   Psychiatric:    Behavior: Behavior normal.       Diagnostic Results     EKG   Indication: Chest pain. Heart rate 107, intervals normal, axis normal. No signs of ST elevation or depression, T wave inversions in leads III and aVF that are similar to prior EKG with no changes.  Impression: Sinus tachycardia with no active ischemia no   changes from prior EKG    RADIOLOGY:  XR CHEST PORTABLE   Final Result      No acute disease          LABS:   Results for orders placed or performed during the hospital encounter of 04/23/21   COVID-19 & Influenza Combo    Specimen: Nasopharyngeal Swab   Result Value Ref Range    SARS-CoV-2 RNA, RT PCR NOT DETECTED NOT DETECTED    INFLUENZA A NOT DETECTED NOT DETECTED    INFLUENZA B NOT DETECTED NOT DETECTED   BMP w/ Reflex to MG   Result Value Ref Range    Sodium 141 136 - 145 mmol/L    Potassium reflex Magnesium 4.2 3.5 - 5.1 mmol/L    Chloride 105 99 - 110 mmol/L    CO2 26 21 - 32 mmol/L    Anion Gap 10 3 - 16    Glucose 125 (H) 70 - 99 mg/dL    BUN 9 7 - 20 mg/dL    Creatinine 1.0 0.6 - 1.2 mg/dL    GFR Non-African American 54 (A) >60    GFR African American >60 >60    Calcium 9.3 8.3 - 10.6 mg/dL   CBC with Auto Differential   Result Value Ref Range    WBC 8.1 4.0 - 11.0 K/uL    RBC 4.69 4.00 - 5.20 M/uL    Hemoglobin 13.0 12.0 - 16.0 g/dL    Hematocrit 46.9 62.9 - 48.0 %    MCV 85.9 80.0 - 100.0 fL    MCH 27.8 26.0 - 34.0 pg    MCHC 32.3 31.0 - 36.0 g/dL    RDW 52.8 41.3 - 24.4 %    Platelets 113 (L) 135 - 450 K/uL    MPV 9.7 5.0 - 10.5  fL    PLATELET SLIDE REVIEW Decreased     SLIDE REVIEW see below     Path Consult Yes     Neutrophils % 57.0 %    Lymphocytes % 4.0 %    Monocytes % 33.0 %    Eosinophils % 1.0 %    Basophils % 0.0 %    Neutrophils Absolute 4.6 1.7 - 7.7 K/uL    Lymphocytes Absolute 0.3 (L) 1.0 - 5.1 K/uL    Monocytes Absolute 2.7 (H) 0.0 - 1.3 K/uL    Eosinophils Absolute 0.1 0.0 - 0.6 K/uL    Basophils Absolute 0.0 0.0 - 0.2 K/uL  Blasts Relative 5 (A) %    RBC Morphology Normal    Troponin   Result Value Ref Range    Troponin <0.01 <0.01 ng/mL   Brain Natriuretic Peptide   Result Value Ref Range    Pro-BNP 183 (H) 0 - 124 pg/mL   Hepatic Function Panel   Result Value Ref Range    Total Protein 7.0 6.4 - 8.2 g/dL    Albumin 4.1 3.4 - 5.0 g/dL    Alkaline Phosphatase 78 40 - 129 U/L    ALT 7 (L) 10 - 40 U/L    AST 16 15 - 37 U/L    Total Bilirubin <0.2 0.0 - 1.0 mg/dL    Bilirubin, Direct <1.6 0.0 - 0.3 mg/dL    Bilirubin, Indirect see below 0.0 - 1.0 mg/dL   Lipase   Result Value Ref Range    Lipase 52.0 13.0 - 60.0 U/L   Urinalysis with Reflex to Culture    Specimen: Urine   Result Value Ref Range    Color, UA Yellow Straw/Yellow    Clarity, UA Clear Clear    Glucose, Ur Negative Negative mg/dL    Bilirubin Urine Negative Negative    Ketones, Urine Negative Negative mg/dL    Specific Gravity, UA 1.020 1.005 - 1.030    Blood, Urine Negative Negative    pH, UA 7.5 5.0 - 8.0    Protein, UA Negative Negative mg/dL    Urobilinogen, Urine 0.2 <2.0 E.U./dL    Nitrite, Urine Negative Negative    Leukocyte Esterase, Urine TRACE (A) Negative    Microscopic Examination YES     Urine Type Voided     Urine Reflex to Culture Not Indicated    Microscopic Urinalysis   Result Value Ref Range    Mucus, UA 1+ (A) None Seen /LPF    WBC, UA 0-2 0 - 5 /HPF    RBC, UA 0-2 0 - 4 /HPF    Epithelial Cells, UA 0-1 0 - 5 /HPF    Bacteria, UA 1+ (A) None Seen /HPF    Crystals, UA 1+ Uric Acid (A) None Seen /HPF   Troponin   Result Value Ref  Range    Troponin <0.01 <0.01 ng/mL       ED BEDSIDE ULTRASOUND:  No results found.    RECENT VITALS: BP: (!) 146/91,Temp: 99.2 ?F (37.3 ?C), Heart Rate: 90, Resp: 15, SpO2: 96 %     Procedures     none    ED Course     Nursing Notes, Past Medical Hx, Past Surgical Hx, Social Hx,Allergies, and Family Hx were reviewed.         patient was given the following medications:  Orders Placed This Encounter   Medications   ? morphine injection 4 mg   ? ondansetron (ZOFRAN) injection 4 mg       CONSULTS:  None    MEDICAL DECISIONMAKING / ASSESSMENT / PLAN     Veronica Winters is a 70 y.o. female who presents with chief complaint of chest pain. Initial exam reveals a female in no acute distress with hypertension, tachycardia, otherwise normal vitals, afebrile.    Physical exam unremarkable including normal heart and lungs.    Patient's presentation is consistent with atypical chest pain, I did consider ACS given her cardiac history with congestive heart failure, but patient has no known coronary artery disease.    EKG nonischemic, troponins negative x2. Chest x-ray and other labs unremarkable.. Urinalysis negative  for infection. Will send for culture at patient's request. Patient had a normal stress test on 11/08/2020. At this time I do not think that her chest   pain is cardiac in nature. Patient on reassessment states she feels much better, was sleeping comfortably. Appropriate for discharge home. Can follow-up with her cardiologist.      Clinical Impression     1. Atypical chest pain        Disposition     PATIENT REFERRED TO:  Harlan Stains, MD  [address]  [place] [address]  [phone]    In 1 week  for ED follow up visit    DISCHARGE MEDICATIONS:  Discharge Medication List as of 04/24/2021 1:55 AM          DISPOSITION Decision To Discharge 04/24/2021 01:47:06 AM         Toma Copier, MD  04/24/21 0455         ED Medication Orders (From admission, onward)    Start Ordered       Status Ordering  Provider    04/23/21 2245 04/23/21 2243  ondansetron (ZOFRAN) injection 4 mg ONCE      Last MAR action: Given - by HERTSENBERG, SONDRA on 04/23/21 at 2314 Mikinzie Maciejewski L    04/23/21 2230 04/23/21 2225  morphine injection 4 mg ONCE      Last MAR action: Given - by HERTSENBERG, SONDRA on 04/23/21 at 2313 Jazmyne Beauchesne L        Lab Results          Troponin (Final result)         Collection Time  Result Time  Troponin      04/24/21 00:23:00  04/24/21 01:00:00  <0.01  Methodology by Troponin T          Previous Results      04/23/21 22:46:00  04/23/21 23:19:00  <0. by Troponin T      10/11/19 14:32:00  10/11/19 15:11:00  <0. by Troponin T                     Final result                                           Urinalysis with Reflex to Culture (Final result)         Collection Time  Result Time  COLOR U  CLARITY U  GLUCOSE U  BILI UR  KETONES U  SPEC GRAV  BLOOD U  PH, UR  Protein, UA  Urobilinogen, Urine      04/23/21 23:36:00  04/24/21 00:08:00  Yellow  Clear  Negative  Negative  Negative  1.020  Negative  7.5  Negative  0.2          Previous Results      06/12/20 16:28:00  06/12/20 16:39:00  Yellow  Clear  Negative  Negative  Negative  1.025  Negative  6.0  TRACE  0.2      06/08/20 19:28:00  06/08/20 19:51:00  Yellow  Clear  Negative  Negative  Negative  1.015  Negative  6.0  Negative  0.2      03/07/20 15:49:00  03/08/20 00:13:00                          10/26/19 21:04:00  10/26/19 21:09:00  Yellow  Clear  Negative  Negative  TRACE  >=1.030  Negative  6.0  Negative  0.2      10/21/19 15:39:00  10/21/19 15:58:00  Yellow  SL CLOUDY  Negative  Negative  Negative  1.015  Negative  6.5  Negative  0.2             Collection Time  Result Time  NITRITE  LEUKOCYTES, UR  Microscopic Examination  Urine Type  Urine Reflex to Culture      04/23/21 23:36:00  04/24/21 00:08:00  Negative  TRACE  YES  Voided  Not Indicated          Previous Results      06/12/20 16:28:00  06/12/20  16:39:00  Negative  SMALL  YES  NotGiven        06/08/20 19:28:00  06/08/20 19:51:00  Negative  Negative  Not Indicated  NotGiven  Not Indicated      03/07/20 15:49:00  03/08/20 00:13:00        Cleancatch        10/26/19 21:04:00  10/26/19 21:09:00  Negative  Negative  Not Indicated  Voided        10/21/19 15:39:00  10/21/19 15:58:00  Negative  TRACE  YES  Voided                       Final result                                           Microscopic Urinalysis (Final result)         Collection Time  Result Time  MUCUS  WBC UA  RBC UA  Epithelial Cells, UA  BACTERIA  Crystals, UA      04/23/21 23:36:00  04/24/21 00:08:00  1+  0-2  0-2  0-1  1+  1+ Uric Acid          Previous Results      06/12/20 16:28:00  06/12/20 16:48:00    3-5  0-2  2-5  Rare        03/07/20 15:49:00  03/08/20 00:13:00    3-5  0-2  11-20  Rare        10/21/19 15:39:00  10/21/19 16:08:00    3-5  None seen  21-50  2+        10/14/19 12:13:00  10/14/19 12:35:00    51-100  >100  6-10  2+        10/11/19 21:35:00  10/11/19 23:03:00    3-5  0-2  21-50                         Final result                                           BMP w/ Reflex to MG (Final result)         Collection Time  Result Time  NA  krflxmg  CL  CO2  ANION GAP  GLUCOSE  BUN  Creatinine  GFR Non-African American  GFR African American      04/23/21 22:46:00  04/23/21 23:19:00  141  4.2  105  26  10  125  9  1.0  54  >60 mL/min/1.1m2 EGFR, calc. for ages 26 and older using the  MDRD formula (not corrected for weight), is valid for stable  renal function.    >60  Chronic Kidney Disease: less than 60 ml/min/1.73 sq.m.   Kidney Failure: less than 15 ml/min/1.73 sq.m.  Results valid for patients 18 years and older.            Previous Results      06/12/20 11:47:00  06/12/20 12:19:00  140  4.9  103  25  12  106  8  0.9  >60>60 mL/min/1.44m2 EGFR, calc. for ages 79 and older using the  MDRD formula (not corrected for weight), is valid for stable  renal function.    >60Chronic Kidney  Disease: less than 60 ml/min/1.73 sq.m.   Kidney Failure: less than 15 ml/min/1.73 sq.m.  Results valid for patients 18 years and older.        06/08/20 16:19:00  06/08/20 16:44:00  139  4.0  102  23  14  115  9  1.1  49>60 mL/min/1.24m2 EGFR, calc. for ages 27 and older using the  MDRD formula (not corrected for weight), is valid for stable  renal function.    59Chronic Kidney Disease: less than 60 ml/min/1.73 sq.m.   Kidney Failure: less than 15 ml/min/1.73 sq.m.  Results valid for patients 18 years and older.        05/08/20 17:20:00  05/08/20 18:01:00  137  6.0Specimen hemolysis has exceeded the interference as defined by Roche.  Value may be falsely increased. Suggest recollection if clinically  indicated.    100  26  11  102  10  0.9  >60>60 mL/min/1.82m2 EGFR, calc. for ages 8 and older using the  MDRD formula (not corrected for weight), is valid for stable  renal function.    >60Chronic Kidney Disease: less than 60 ml/min/1.73 sq.m.   Kidney Failure: less than 15 ml/min/1.73 sq.m.  Results valid for patients 18 years and older.        04/18/20 14:00:00  04/18/20 23:11:00  142  4.0  104  21  17  124  10  1.0  55>60 mL/min/1.4m2 EGFR, calc. for ages 41 and older using the  MDRD formula (not corrected for weight), is valid for stable  renal function.    >60Chronic Kidney Disease: less than 60 ml/min/1.73 sq.m.   Kidney Failure: less than 15 ml/min/1.73 sq.m.  Results valid for patients 18 years and older.        03/07/20 15:37:00  03/07/20 23:38:00  144  4.2  106  23  15  94  7  1.0  55>60 mL/min/1.13m2 EGFR, calc. for ages 49 and older using the  MDRD formula (not corrected for weight), is valid for stable  renal function.    >60Chronic Kidney Disease: less than 60 ml/min/1.73 sq.m.   Kidney Failure: less than 15 ml/min/1.73 sq.m.  Results valid for patients 18 years and older.               Collection Time  Result Time  CALCIUM      04/23/21 22:46:00  04/23/21 23:19:00  9.3          Previous  Results      06/12/20 11:47:00  06/12/20 12:19:00  8.8      06/08/20 16:19:00  06/08/20 16:44:00  9.1      05/08/20 17:20:00  05/08/20 18:01:00  9.1      04/18/20 14:00:00  04/18/20  23:11:00  9.0      03/07/20 15:37:00  03/07/20 23:38:00  9.2                     Final result                                           CBC with Auto Differential (Final result)         Collection Time  Result Time  WBC  RBC  HGB  HCT  MCV  MCH  MCHC  RDW  PLT  MPV      04/23/21 22:46:00  04/23/21 23:37:00  8.1  4.69  13.0  40.3  85.9  27.8  32.3  14.2  113  9.7          Previous Results      01/03/21 07:41:00  01/03/21 08:32:00  4.4  5.31  15.0  46.1  86.8  28.2  32.5  13.9  122  9.5      12/20/20 10:59:00  12/20/20 19:08:00  3.7  5.18  14.7  45.4  87.7  28.3  32.3  14.0  124  10.0      06/12/20 11:47:00  06/12/20 12:01:00  4.4  4.88  13.6  41.7  85.4  28.0  32.8  14.6  126  9.7      06/08/20 16:19:00  06/08/20 16:25:00  3.6  5.17  14.4  44.6  86.4  27.8  32.2  14.8  127  9.4      05/08/20 17:20:00  05/08/20 17:44:00  3.6  5.14  14.5  45.0  87.5  28.2  32.2  14.4  116  9.9             Collection Time  Result Time  PLATELET SLIDE REVIEW  SLIDE REVIEW  Path Consult  NEUTRO PCT  LYMPHO PCT  MONO PCT  EOS  BASOS PCT  NEUTRO ABS  LYMPHS ABS      04/23/21 22:46:00  04/23/21 23:37:00  Decreased  see below  Slide review agrees with reported results  Yes  Sent for Pathology Review  57.0  4.0  33.0  1.0  0.0  4.6  0.3          Previous Results      01/03/21 07:41:00  01/03/21 08:32:00        36.3  33.3  24.5  5.3  0.6  1.6  1.5      12/20/20 10:59:00  12/20/20 19:08:00        35.1  40.2  18.2  5.9  0.6  1.3  1.5      06/12/20 11:47:00  06/12/20 12:01:00        33.4  30.0  34.9  0.8  0.9  1.5  1.3      06/08/20 16:19:00  06/08/20 16:25:00        40.6  37.4  20.8  0.7  0.5  1.5  1.3      05/08/20 17:20:00  05/08/20 17:44:00        35.4  37.6  25.6  0.9  0.5  1.3  1.3             Collection Time  Result Time  MONOS ABS  EOS ABS  BASOS ABS  BLASTS  PCT  RBC Morphology      04/23/21 22:46:00  04/23/21 23:37:00  2.7  0.1  0.0  5  Normal          Previous Results      01/03/21 07:41:00  01/03/21 08:32:00  1.1  0.2  0.0          12/20/20 10:59:00  12/20/20 19:08:00  0.7  0.2  0.0          06/12/20 11:47:00  06/12/20 12:01:00  1.5  0.0  0.0          06/08/20 16:19:00  06/08/20 16:25:00  0.7  0.0  0.0          05/08/20 17:20:00  05/08/20 17:44:00  0.9  0.0  0.0                         Final result                                          Troponin (Final result)         Collection Time  Result Time  Troponin      04/23/21 22:46:00  04/23/21 23:19:00  <0.01  Methodology by Troponin T          Previous Results      10/11/19 14:32:00  10/11/19 15:11:00  <0. by Troponin T                     Final result                                           Brain Natriuretic Peptide (Final result)         Collection Time  Result Time  Pro-BNP      04/23/21 22:46:00  04/23/21 23:19:00  183  Methodology by NT-proBNP    An age-independent cutoff point of 300 pg/ml has a 98%  negative predictive value excluding acute heart failure.  Values exceeding the age-related cutoff values (450 pg/mL if  age<50, 900 if 50-75 and 1800 if >75) has 90% sensitivity and  84% specificity for diagnosing acute HF. In patients with  renal compromise (eGFR<60) values greater than 1200pg/ml have  a diagnostic sensitivity and specificity of 89% and 72% for  acute HF.            Previous Results      10/11/19 14:32:00  10/11/19 15:11:00  by NT-proBNP    An age-independent cutoff point of 300 pg/ml has a 98%  negative predictive value excluding acute heart failure.  Values exceeding the age-related cutoff values (450 pg/mL if  age<50, 900 if 50-75 and 1800 if >75) has 90% sensitivity and  84% specificity for diagnosing acute HF. In patients with  renal compromise (eGFR<60) values greater than 1200pg/ml have  a diagnostic sensitivity and specificity of 89% and 72% for  acute  HF.                       Final result                                           Hepatic Function Panel (Final result)  Collection Time  Result Time  PROTEIN  Albumin  ALK PHOS  ALT  AST  BILI TOTAL  BILI DIR  Bilirubin, Indirect  NA  GFR African American      04/23/21 22:46:00  04/23/21 23:19:00  7.0  4.1  78  7  16  <0.2  <0.2  see below  Indirect Bilirubin cannot be calculated since Total Bilirubin  and/or Direct Bilirubin is below measurable range.                Previous Results      06/12/20 11:47:00  06/12/20 12:19:00  7.7  4.0  63Specimen hemolysis has exceeded the interference as defined by Roche.  Value may be falsely decreased. Suggest recollection if clinically  indicated.    8Specimen hemolysis has exceeded the interference as defined by Roche.  Result may be affected. Suggest recollection if clinically indicated.    23Specimen hemolysis has exceeded the interference as defined by Roche.  Value may be falsely increased. Suggest recollection if clinically  indicated.    <0.2  <0.2Specimen hemolysis has exceeded the interference as defined by Roche.  Value may be falsely increased. Suggest recollection if clinically  indicated.    see belowIndirect Bilirubin cannot be calculated since Total Bilirubin  and/or Direct Bilirubin is below measurable range.            06/08/20 16:19:00  06/08/20 16:44:00  7.8  4.4  76  7  13  <0.2  <0.2  see belowIndirect Bilirubin cannot be calculated since Total Bilirubin  and/or Direct Bilirubin is below measurable range.            05/08/20 17:20:00  05/08/20 18:01:00  8.4  4.2  64Specimen hemolysis has exceeded the interference as defined by Roche.  Value may be falsely decreased. Suggest recollection if clinically  indicated.    10Specimen hemolysis has exceeded the interference as defined by Roche.  Result may be affected. Suggest recollection if clinically indicated.    31Specimen hemolysis has exceeded the interference as defined by Roche.  Value may be falsely  increased. Suggest recollection if clinically  indicated.    0.3      137  >60Chronic Kidney Disease: less than 60 ml/min/1.73 sq.m.   Kidney Failure: less than 15 ml/min/1.73 sq.m.  Results valid for patients 18 years and older.        04/18/20 14:00:00  04/18/20 23:11:00    4.2              142  >60Chronic Kidney Disease: less than 60 ml/min/1.73 sq.m.   Kidney Failure: less than 15 ml/min/1.73 sq.m.  Results valid for patients 18 years and older.        03/07/20 15:37:00  03/07/20 23:38:00    4.4              144  >60Chronic Kidney Disease: less than 60 ml/min/1.73 sq.m.   Kidney Failure: less than 15 ml/min/1.73 sq.m.  Results valid for patients 18 years and older.               Collection Time  Result Time  CALCIUM  Albumin/Globulin Ratio  Globulin  PHOS  krflxmg  CL  CO2  ANION GAP  GLUCOSE  BUN      04/23/21 22:46:00  04/23/21 23:19:00                              Previous Results  06/12/20 11:47:00  06/12/20 12:19:00                          06/08/20 16:19:00  06/08/20 16:44:00                          05/08/20 17:20:00  05/08/20 18:01:00  9.1  1.0  4.2    6.0Specimen hemolysis has exceeded the interference as defined by Roche.  Value may be falsely increased. Suggest recollection if clinically  indicated.    100  26  11  102  10      04/18/20 14:00:00  04/18/20 23:11:00  9.0      3.3  4.0  104  21  17  124  10      03/07/20 15:37:00  03/07/20 23:38:00  9.2      2.9  4.2  106  23  15  94  7             Collection Time  Result Time  Creatinine  GFR Non-African American      04/23/21 22:46:00  04/23/21 23:19:00              Previous Results      06/12/20 11:47:00  06/12/20 12:19:00          06/08/20 16:19:00  06/08/20 16:44:00          05/08/20 17:20:00  05/08/20 18:01:00  0.9  >60>60 mL/min/1.63m2 EGFR, calc. for ages 41 and older using the  MDRD formula (not corrected for weight), is valid for stable  renal function.        04/18/20 14:00:00  04/18/20 23:11:00  1.0  55>60 mL/min/1.4m2 EGFR, calc. for  ages 61 and older using the  MDRD formula (not corrected for weight), is valid for stable  renal function.        03/07/20 15:37:00  03/07/20 23:38:00  1.0  55>60 mL/min/1.51m2 EGFR, calc. for ages 77 and older using the  MDRD formula (not corrected for weight), is valid for stable  renal function.                       Final result                                          Lipase (Final result)         Collection Time  Result Time  LIPASE      04/23/21 22:46:00  04/23/21 23:19:00  52.0          Previous Results      06/12/20 11:47:00  06/12/20 12:19:00  38.0      06/08/20 16:19:00  06/08/20 16:44:00  48.0      05/08/20 17:20:00  05/08/20 18:01:00  39.0      10/26/19 19:44:00  10/26/19 20:17:00  88.0                     Final result                                       Imaging Results          XR CHEST PORTABLE (Final result)   Result time 04/23/21 22:53:38  Final result by Jabier Gauss, MD (04/23/21 22:53:38)                        Impression:        No acute disease                  Narrative:      1 view chest    HISTORY: Chest pain    COMPARISON: None    FINDINGS:    The cardiomediastinal contours are normal. The lungs are clear. No pleural abnormality is evident.                                   ED Administered Medications from 04/23/2021 2215 to 04/24/2021 0455        Date/Time Order Dose Route Action Action by Comments     04/23/2021 2313 EDT morphine injection 4 mg 4 mg IntraVENous Given Marilynne Drivers, RN --     04/23/2021 2314 EDT ondansetron (ZOFRAN) injection 4 mg 4 mg IntraVENous Given Marilynne Drivers, RN --        ED Treatment Team     Provider Role From To Phone Pager    Toma Copier, MD Attending Provider 04/23/21 2222 04/24/21 1610 [phone] --        Code Onset/Outcome    None     Code Interventions/Drips/Airways    None     bmk  Diagnosis     Diagnosis Comment    Atypical chest pain         bmkbmk  ED Prescriptions    None     bmkbmk  Follow-up Information     Follow up  With Specialties Details Why Contact Info    C Tonia Brooms, MD Internal Medicine In 1 week for ED follow up visit [address]  [place] [address]  [phone]          bmk    Discharge Instructions       All of your blood work was normal including blood work for your heart. Your urine testing was normal with no infection. We will send it for culture and let you know if you need to take antibiotics.

## 2021-04-23 NOTE — ED Provider Notes (Signed)
[place]          ATTENDING PHYSICIAN NOTE       Date of evaluation: 04/23/2021    Chief Complaint     Chest Pain (Pain that goes under her left breast and into her left axilla. States it feels like a pressure. Pain started about 2100 hours. Pain is an 8 of 10. States she cannot take Asprin due to kidney failure. In stage 4 kidney failure.  )      History of Present Illness     Gabrielle Wells is a 70 y.o. female who presents with a chief complaint of chest pain.  Patient states about 3 to 4 hours ago she was sitting in her recliner watching TV when all of a sudden she had onset of pain underneath her left arm.  Over the next 30 minutes she feels like the pain started to radiate underneath her left breast into the front of her left chest.  Pain is currently an 8 out of 10 and has been constant with no specific alleviating or exacerbating factors.  Not associated with shortness of breath.  Associated with some nausea but no vomiting.  No fevers.  No body aches.  No sick contacts.  States she cannot take aspirin due to stage IV kidney failure.  She does not have a history of coronary artery disease but does have a history of chronic congestive heart failure and hypertension, last echo in 2021 with an EF of 60 to 65%.  Patient states she has never had pain like she is having currently before.  No history of blood clots.    Review of Systems     Review of Systems   Constitutional:  Negative for activity change, appetite change, chills and fever.   HENT:  Negative for congestion, ear pain, facial swelling, nosebleeds, rhinorrhea and sore throat.    Eyes:  Negative for photophobia, pain, discharge, itching and visual disturbance.   Respiratory:  Negative for cough, chest tightness, shortness of breath and wheezing.    Cardiovascular:  Positive for chest pain. Negative for palpitations and leg swelling.   Gastrointestinal:  Positive for nausea. Negative for abdominal pain,  diarrhea and vomiting.   Endocrine: Negative for cold intolerance, heat intolerance, polydipsia and polyuria.   Genitourinary:  Negative for dysuria, flank pain, hematuria, pelvic pain, vaginal bleeding and vaginal discharge.   Musculoskeletal:  Negative for arthralgias, back pain, joint swelling, myalgias and neck pain.   Skin:  Negative for rash and wound.   Allergic/Immunologic: Negative for environmental allergies.   Neurological:  Negative for dizziness, tremors, seizures, syncope, weakness, light-headedness and headaches.   Hematological:  Negative for adenopathy. Does not bruise/bleed easily.   Psychiatric/Behavioral:  Negative for confusion and hallucinations.      Past Medical, Surgical, Family, and Social History     She has a past medical history of CHF (congestive heart failure) ([place]), Epidural abscess, GERD (gastroesophageal reflux disease), HTN (hypertension), Hyperlipidemia, IBS (irritable bowel syndrome), Lumbar radiculopathy, Morbid obesity ([place]), Muscle fasciculation, Neuropathic pain, SBO (small bowel obstruction) ([place]), Spinal fracture of T8 vertebra ([place]), Surgical site infection, and Urinary incontinence.  She has a past surgical history that includes Appendectomy; Breast lumpectomy (Bilateral); Hysterectomy, total abdominal; Cholecystectomy; Total knee arthroplasty (Bilateral, 2009); Cardiac catheterization (2012); Total shoulder arthroplasty (Right, 2010); Abdominal hernia repair (2010); Colonoscopy; Upper gastrointestinal endoscopy (N/A, 05/26/2020); Colonoscopy (05/26/2020); Colonoscopy (05/26/2020); and CT BIOPSY BONE MARROW (01/03/2021).  Her family history includes Cancer  in her paternal grandmother.  She reports that she has never smoked. She has never used smokeless tobacco. She reports that she does not drink alcohol and does not use drugs.    Medications     Discharge Medication List as of 04/24/2021  1:55 AM        CONTINUE these medications which have NOT CHANGED    Details    hydroCHLOROthiazide (HYDRODIURIL) 25 MG tablet TAKE ONE-HALF TABLET BY MOUTH DAILY, Disp-15 tablet, R-0Normal      carvedilol (COREG) 25 MG tablet TAKE ONE TABLET BY MOUTH TWICE DAILY, Disp-180 tablet, R-3Normal      gabapentin (NEURONTIN) 300 MG capsule Take 1 capsule by mouth 3 times daily for 90 days., Disp-90 capsule, R-2Print      albuterol sulfate HFA (VENTOLIN HFA) 108 (90 Base) MCG/ACT inhaler Inhale 2 puffs into the lungs 4 times daily as needed for Wheezing, Disp-18 g, R-0Normal      hydrALAZINE (APRESOLINE) 50 MG tablet Take 1 tablet by mouth 3 times daily, Disp-90 tablet, R-5Normal      omeprazole (PRILOSEC) 20 MG delayed release capsule Take 1 capsule by mouth every morning (before breakfast), Disp-90 capsule, R-1Normal      amLODIPine (NORVASC) 10 MG tablet Take 1 tablet by mouth daily, Disp-30 tablet, R-5Normal      Cholecalciferol (VITAMIN D3) 25 MCG TABS TAKE TWO TABLETS BY MOUTH DAILY, Disp-60 tablet, R-2Normal      DULoxetine (CYMBALTA) 30 MG extended release capsule TAKE ONE CAPSULE BY MOUTH DAILY, Disp-30 capsule, R-2Normal      dicyclomine (BENTYL) 10 MG capsule TAKE ONE CAPSULE BY MOUTH FOUR TIMES DAILY, Disp-120 capsule, R-0Normal      atorvastatin (LIPITOR) 40 MG tablet Take 1 tablet by mouth daily, Disp-90 tablet, R-1Normal      furosemide (LASIX) 20 MG tablet Take 1 tablet by mouth daily as needed (Fluid retention. Weight gain of more than 5lbs in a day), Disp-30 tablet, R-3Normal      tiZANidine (ZANAFLEX) 2 MG tablet Take 1 tablet by mouth nightly as needed (muscle spasms), Disp-90 tablet, R-3Normal      senna (SENOKOT) 8.6 MG tablet Take 1 tablet by mouth 2 times daily, Disp-60 tablet, R-11Normal      acetaminophen (TYLENOL) 500 MG tablet Take 500 mg by mouth every 6 hours as needed for Pain Historical Med             Allergies     She is allergic to vancomycin, aspirin, mushroom extract complex, shellfish allergy, sulfasalazine, acetaminophen, betadine [povidone iodine],  ciprofloxacin, coconut oil, codeine, fish-derived products, ibuprofen, iodine, metronidazole, other, sulfa antibiotics, amoxicillin-pot clavulanate, coconut flavor, eggs or egg-derived products, and iodinated diagnostic agents.    Physical Exam     INITIAL VITALS: BP: (!) 173/96, Temp: 99.2 ??F (37.3 ??C), Heart Rate: (!) 109, Resp: 22, SpO2: 98 %   Physical Exam  Constitutional:       General: She is not in acute distress.     Appearance: She is well-developed. She is not diaphoretic.   HENT:      Head: Normocephalic and atraumatic.      Mouth/Throat:      Mouth: Mucous membranes are moist.      Pharynx: No oropharyngeal exudate.   Eyes:      General:         Right eye: No discharge.         Left eye: No discharge.      Conjunctiva/sclera: Conjunctivae normal.  Pupils: Pupils are equal, round, and reactive to light.   Neck:      Thyroid: No thyromegaly.      Vascular: No JVD.      Trachea: No tracheal deviation.   Cardiovascular:      Rate and Rhythm: Regular rhythm. Tachycardia present.      Heart sounds: Normal heart sounds. No murmur heard.    No friction rub. No gallop.   Pulmonary:      Effort: Pulmonary effort is normal. No respiratory distress.      Breath sounds: Normal breath sounds. No stridor. No wheezing or rales.   Chest:      Chest wall: No tenderness.   Abdominal:      General: Bowel sounds are normal. There is no distension.      Palpations: Abdomen is soft.      Tenderness: There is no abdominal tenderness. There is no guarding or rebound.   Musculoskeletal:         General: No tenderness or deformity. Normal range of motion.      Cervical back: Normal range of motion and neck supple.   Lymphadenopathy:      Cervical: No cervical adenopathy.   Skin:     General: Skin is warm and dry.      Capillary Refill: Capillary refill takes less than 2 seconds.      Findings: No erythema or rash.   Neurological:      General: No focal deficit present.      Mental Status: She is alert and oriented to person,  place, and time.      Cranial Nerves: No cranial nerve deficit.      Coordination: Coordination normal.   Psychiatric:         Behavior: Behavior normal.       Diagnostic Results     EKG   Indication: Chest pain.  Heart rate 107, intervals normal, axis normal.  No signs of ST elevation or depression, T wave inversions in leads III and aVF that are similar to prior EKG with no changes.  Impression: Sinus tachycardia with no active ischemia no changes from prior EKG    RADIOLOGY:  XR CHEST PORTABLE   Final Result      No acute disease          LABS:   Results for orders placed or performed during the hospital encounter of 04/23/21   COVID-19 & Influenza Combo    Specimen: Nasopharyngeal Swab   Result Value Ref Range    SARS-CoV-2 RNA, RT PCR NOT DETECTED NOT DETECTED    INFLUENZA A NOT DETECTED NOT DETECTED    INFLUENZA B NOT DETECTED NOT DETECTED   BMP w/ Reflex to MG   Result Value Ref Range    Sodium 141 136 - 145 mmol/L    Potassium reflex Magnesium 4.2 3.5 - 5.1 mmol/L    Chloride 105 99 - 110 mmol/L    CO2 26 21 - 32 mmol/L    Anion Gap 10 3 - 16    Glucose 125 (H) 70 - 99 mg/dL    BUN 9 7 - 20 mg/dL    Creatinine 1.0 0.6 - 1.2 mg/dL    GFR Non-African American 54 (A) >60    GFR African American >60 >60    Calcium 9.3 8.3 - 10.6 mg/dL   CBC with Auto Differential   Result Value Ref Range    WBC 8.1 4.0 - 11.0 K/uL  RBC 4.69 4.00 - 5.20 M/uL    Hemoglobin 13.0 12.0 - 16.0 g/dL    Hematocrit 40.3 36.0 - 48.0 %    MCV 85.9 80.0 - 100.0 fL    MCH 27.8 26.0 - 34.0 pg    MCHC 32.3 31.0 - 36.0 g/dL    RDW 14.2 12.4 - 15.4 %    Platelets 113 (L) 135 - 450 K/uL    MPV 9.7 5.0 - 10.5 fL    PLATELET SLIDE REVIEW Decreased     SLIDE REVIEW see below     Path Consult Yes     Neutrophils % 57.0 %    Lymphocytes % 4.0 %    Monocytes % 33.0 %    Eosinophils % 1.0 %    Basophils % 0.0 %    Neutrophils Absolute 4.6 1.7 - 7.7 K/uL    Lymphocytes Absolute 0.3 (L) 1.0 - 5.1 K/uL    Monocytes Absolute 2.7 (H) 0.0 - 1.3 K/uL     Eosinophils Absolute 0.1 0.0 - 0.6 K/uL    Basophils Absolute 0.0 0.0 - 0.2 K/uL    Blasts Relative 5 (A) %    RBC Morphology Normal    Troponin   Result Value Ref Range    Troponin <0.01 <0.01 ng/mL   Brain Natriuretic Peptide   Result Value Ref Range    Pro-BNP 183 (H) 0 - 124 pg/mL   Hepatic Function Panel   Result Value Ref Range    Total Protein 7.0 6.4 - 8.2 g/dL    Albumin 4.1 3.4 - 5.0 g/dL    Alkaline Phosphatase 78 40 - 129 U/L    ALT 7 (L) 10 - 40 U/L    AST 16 15 - 37 U/L    Total Bilirubin <0.2 0.0 - 1.0 mg/dL    Bilirubin, Direct <0.2 0.0 - 0.3 mg/dL    Bilirubin, Indirect see below 0.0 - 1.0 mg/dL   Lipase   Result Value Ref Range    Lipase 52.0 13.0 - 60.0 U/L   Urinalysis with Reflex to Culture    Specimen: Urine   Result Value Ref Range    Color, UA Yellow Straw/Yellow    Clarity, UA Clear Clear    Glucose, Ur Negative Negative mg/dL    Bilirubin Urine Negative Negative    Ketones, Urine Negative Negative mg/dL    Specific Gravity, UA 1.020 1.005 - 1.030    Blood, Urine Negative Negative    pH, UA 7.5 5.0 - 8.0    Protein, UA Negative Negative mg/dL    Urobilinogen, Urine 0.2 <2.0 E.U./dL    Nitrite, Urine Negative Negative    Leukocyte Esterase, Urine TRACE (A) Negative    Microscopic Examination YES     Urine Type Voided     Urine Reflex to Culture Not Indicated    Microscopic Urinalysis   Result Value Ref Range    Mucus, UA 1+ (A) None Seen /LPF    WBC, UA 0-2 0 - 5 /HPF    RBC, UA 0-2 0 - 4 /HPF    Epithelial Cells, UA 0-1 0 - 5 /HPF    Bacteria, UA 1+ (A) None Seen /HPF    Crystals, UA 1+ Uric Acid (A) None Seen /HPF   Troponin   Result Value Ref Range    Troponin <0.01 <0.01 ng/mL       ED BEDSIDE ULTRASOUND:  No results found.    RECENT VITALS:  BP: (!) 146/91,Temp: 99.2 ??F (  37.3 ??C), Heart Rate: 90, Resp: 15, SpO2: 96 %     Procedures     none    ED Course     Nursing Notes, Past Medical Hx, Past Surgical Hx, Social Hx,Allergies, and Family Hx were reviewed.         patient was given the  following medications:  Orders Placed This Encounter   Medications    morphine injection 4 mg    ondansetron (ZOFRAN) injection 4 mg       CONSULTS:  None    MEDICAL DECISIONMAKING / ASSESSMENT / PLAN     Gabrielle Wells is a 70 y.o. female who presents with chief complaint of chest pain.  Initial exam reveals a female in no acute distress with hypertension, tachycardia, otherwise normal vitals, afebrile.    Physical exam unremarkable including normal heart and lungs.    Patient's presentation is consistent with atypical chest pain, I did consider ACS given her cardiac history with congestive heart failure, but patient has no known coronary artery disease.    EKG nonischemic, troponins negative x2.  Chest x-ray and other labs unremarkable..  Urinalysis negative for infection.  Will send for culture at patient's request.  Patient had a normal stress test on 11/08/2020.  At this time I do not think that her chest pain is cardiac in nature.  Patient on reassessment states she feels much better, was sleeping comfortably.  Appropriate for discharge home.  Can follow-up with her cardiologist.      Clinical Impression     1. Atypical chest pain        Disposition     PATIENT REFERRED TO:  Lavona Mound, MD  [address] [address]  [phone]    In 1 week  for ED follow up visit    DISCHARGE MEDICATIONS:  Discharge Medication List as of 04/24/2021  1:55 AM          DISPOSITION Decision To Discharge 04/24/2021 01:47:06 AM         Ottis Stain, MD  04/24/21 [phone]

## 2021-04-24 ENCOUNTER — Inpatient Hospital Stay
Admit: 2021-04-24 | Discharge: 2021-04-24 | Disposition: A | Discharge status: Home / Self Care | Payer: PRIVATE HEALTH INSURANCE | Attending: Emergency Medicine

## 2021-04-24 LAB — HEPATIC FUNCTION PANEL
ALT: 7 U/L (ref 10–40)
ALT: 7 U/L — ABNORMAL LOW (ref 10–40)
AST: 16 U/L (ref 15–37)
AST: 16 U/L (ref 15–37)
Albumin: 4.1 g/dL (ref 3.4–5.0)
Albumin: 4.1 g/dL (ref 3.4–5.0)
Alkaline Phosphatase: 78 U/L (ref 40–129)
Alkaline Phosphatase: 78 U/L (ref 40–129)
Bilirubin, Direct: <0.2 mg/dL (ref 0.0–0.3)
Bilirubin, Direct: <0.2 mg/dL (ref 0.0–0.3)
Total Bilirubin: <0.2 mg/dL (ref 0.0–1.0)
Total Bilirubin: <0.2 mg/dL (ref 0.0–1.0)
Total Protein: 7 g/dL (ref 6.4–8.2)
Total Protein: 7 g/dL (ref 6.4–8.2)

## 2021-04-24 LAB — CBC WITH PLATELET AND DIFFERENTIAL
Basophils Absolute: 0 K/uL (ref 0.0–0.2)
Basophils Relative: 0 %
Blasts Relative: 5 % — AB
Eosinophils Absolute: 0.1 K/uL (ref 0.0–0.6)
Eosinophils Relative: 1 %
Hematocrit: 40.3 % (ref 36.0–48.0)
Hemoglobin: 13 g/dL (ref 12.0–16.0)
Lymphocytes Absolute: 0.3 K/uL — ABNORMAL LOW (ref 1.0–5.1)
Lymphocytes Relative: 4 %
MCH: 27.8 pg (ref 26.0–34.0)
MCHC: 32.3 g/dL (ref 31.0–36.0)
MCV: 85.9 fL (ref 80.0–100.0)
MPV: 9.7 fL (ref 5.0–10.5)
Monocytes Absolute: 2.7 K/uL — ABNORMAL HIGH (ref 0.0–1.3)
Monocytes Relative: 33 %
Neutrophils Absolute: 4.6 K/uL (ref 1.7–7.7)
Neutrophils Relative: 57 %
Platelets: 113 K/uL — ABNORMAL LOW (ref 135–450)
RBC Morphology: NORMAL
RBC: 4.69 M/uL (ref 4.00–5.20)
RDW: 14.2 % (ref 12.4–15.4)
Slide Review: DECREASED
WBC: 8.1 K/uL (ref 4.0–11.0)

## 2021-04-24 LAB — URINALYSIS, REFLEX CULTURE
Bilirubin, UA: NEGATIVE
Blood, UA: NEGATIVE
Glucose, UA: NEGATIVE mg/dL
Ketones, UA: NEGATIVE mg/dL
Nitrite, UA: NEGATIVE
Protein, UA: NEGATIVE mg/dL
Specific Gravity, UA: 1.02 (ref 1.005–1.030)
Urobilinogen, UA: 0.2 EU/dL (ref <2.0)
pH, UA: 7.5 (ref 5.0–8.0)

## 2021-04-24 LAB — BASIC METABOLIC PANEL REFLEX MG
Anion Gap: 10 (ref 3–16)
BUN: 9 mg/dL (ref 7–20)
CO2: 26 mmol/L (ref 21–32)
Calcium: 9.3 mg/dL (ref 8.3–10.6)
Chloride: 105 mmol/L (ref 99–110)
Creatinine: 1 mg/dL (ref 0.6–1.2)
Glucose: 125 mg/dL (ref 70–99)
POC EGFR MDRD Non Af American: 54 (ref >60)
POC GFR MDRD Af Amer: >60 (ref >60)
Potassium: 4.2 mmol/L (ref 3.5–5.1)
Sodium: 141 mmol/L (ref 136–145)

## 2021-04-24 LAB — PATHOLOGIST REVIEW OF PERIPHERAL SMEAR

## 2021-04-24 LAB — URINALYSIS MICROSCOPIC

## 2021-04-24 LAB — URINE CULTURE, ROUTINE: Urine Culture, Routine: NO GROWTH

## 2021-04-24 LAB — INFLUENZA A+B, COVID19 COMBINATION ASSAY, NAA
Influenza A: NOT DETECTED
Influenza B: NOT DETECTED
SARS-CoV-2: NOT DETECTED

## 2021-04-24 LAB — B NATRIURETIC PEPTIDE: NT Pro BNP: 183 pg/mL — ABNORMAL HIGH (ref 0–124)

## 2021-04-24 LAB — TROPONIN I
Troponin I: <0.01 ng/mL (ref <0.01)
Troponin I: <0.01 ng/mL (ref <0.01)

## 2021-04-24 LAB — LIPASE, SERUM: Lipase: 52 U/L (ref 13.0–60.0)

## 2021-04-24 LAB — BASIC METABOLIC PANEL W/ REFLEX TO MG FOR LOW K
Anion Gap: 10 (ref 3–16)
BUN: 9 mg/dL (ref 7–20)
CO2: 26 mmol/L (ref 21–32)
Calcium: 9.3 mg/dL (ref 8.3–10.6)
Chloride: 105 mmol/L (ref 99–110)
Creatinine: 1 mg/dL (ref 0.6–1.2)
GFR African American: >60 (ref >60)
GFR Non-African American: 54 — AB (ref >60)
Glucose: 125 mg/dL — ABNORMAL HIGH (ref 70–99)
Potassium reflex Magnesium: 4.2 mmol/L (ref 3.5–5.1)
Sodium: 141 mmol/L (ref 136–145)

## 2021-04-24 LAB — CBC WITH AUTO DIFFERENTIAL
Basophils %: 0 %
Basophils Absolute: 0 10*3/uL (ref 0.0–0.2)
Blasts Relative: 5 % — AB
Eosinophils %: 1 %
Eosinophils Absolute: 0.1 10*3/uL (ref 0.0–0.6)
Hematocrit: 40.3 % (ref 36.0–48.0)
Hemoglobin: 13 g/dL (ref 12.0–16.0)
Lymphocytes %: 4 %
Lymphocytes Absolute: 0.3 10*3/uL — ABNORMAL LOW (ref 1.0–5.1)
MCH: 27.8 pg (ref 26.0–34.0)
MCHC: 32.3 g/dL (ref 31.0–36.0)
MCV: 85.9 fL (ref 80.0–100.0)
MPV: 9.7 fL (ref 5.0–10.5)
Monocytes %: 33 %
Monocytes Absolute: 2.7 10*3/uL — ABNORMAL HIGH (ref 0.0–1.3)
Neutrophils %: 57 %
Neutrophils Absolute: 4.6 10*3/uL (ref 1.7–7.7)
PLATELET SLIDE REVIEW: DECREASED
Platelets: 113 10*3/uL — ABNORMAL LOW (ref 135–450)
RBC Morphology: NORMAL
RBC: 4.69 M/uL (ref 4.00–5.20)
RDW: 14.2 % (ref 12.4–15.4)
WBC: 8.1 10*3/uL (ref 4.0–11.0)

## 2021-04-24 LAB — EKG 12-LEAD
Atrial Rate: 107 {beats}/min
P Axis: 55 degrees
P-R Interval: 162 ms
Q-T Interval: 344 ms
QRS Duration: 74 ms
QTc Calculation (Bazett): 459 ms
R Axis: 39 degrees
T Axis: -26 degrees
Ventricular Rate: 107 {beats}/min

## 2021-04-24 LAB — URINALYSIS WITH REFLEX TO CULTURE
Bilirubin Urine: NEGATIVE
Blood, Urine: NEGATIVE
Glucose, Ur: NEGATIVE mg/dL
Ketones, Urine: NEGATIVE mg/dL
Nitrite, Urine: NEGATIVE
Protein, UA: NEGATIVE mg/dL
Specific Gravity, UA: 1.02 (ref 1.005–1.030)
Urobilinogen, Urine: 0.2 E.U./dL (ref <2.0)
pH, UA: 7.5 (ref 5.0–8.0)

## 2021-04-24 LAB — LIPASE: Lipase: 52 U/L (ref 13.0–60.0)

## 2021-04-24 LAB — MICROSCOPIC URINALYSIS

## 2021-04-24 LAB — BRAIN NATRIURETIC PEPTIDE: Pro-BNP: 183 pg/mL — ABNORMAL HIGH (ref 0–124)

## 2021-04-24 LAB — TROPONIN
Troponin: <0.01 ng/mL (ref <0.01)
Troponin: <0.01 ng/mL (ref <0.01)

## 2021-04-24 LAB — COVID-19 & INFLUENZA COMBO
INFLUENZA A: NOT DETECTED
INFLUENZA B: NOT DETECTED
SARS-CoV-2 RNA, RT PCR: NOT DETECTED

## 2021-04-24 MED ORDER — ONDANSETRON HCL 4 MG/2ML IJ SOLN
4 MG/2ML | Freq: Once | INTRAMUSCULAR | Status: AC
Start: 2021-04-24 — End: 2021-04-23
  Administered 2021-04-24: 03:00:00 4 mg via INTRAVENOUS

## 2021-04-24 MED ORDER — MORPHINE SULFATE 4 MG/ML IJ SOLN
4 MG/ML | Freq: Once | INTRAMUSCULAR | Status: AC
Start: 2021-04-24 — End: 2021-04-23
  Administered 2021-04-24: 03:00:00 4 mg via INTRAVENOUS

## 2021-04-24 MED FILL — MORPHINE SULFATE 4 MG/ML IJ SOLN: 4 mg/mL | INTRAMUSCULAR | Qty: 1

## 2021-04-24 MED FILL — ONDANSETRON HCL 4 MG/2ML IJ SOLN: 4 MG/2ML | INTRAMUSCULAR | Qty: 2

## 2021-04-24 NOTE — Discharge Instructions (Signed)
All of your blood work was normal including blood work for your heart.  Your urine testing was normal with no infection.  We will send it for culture and let you know if you need to take antibiotics.

## 2021-04-24 NOTE — ED Notes (Signed)
Patient discharged to home via family.  Written discharge instructions reviewed with understanding.  Copy of AVS sent home with patient. Patient able to walk from ED without assistance.        Dory Larsen, RN  04/24/21 [phone]

## 2021-04-25 LAB — CULTURE, URINE: Urine Culture, Routine: NO GROWTH

## 2021-04-25 LAB — BLOOD SMEAR REVIEW

## 2021-05-03 ENCOUNTER — Ambulatory Visit
Payer: PRIVATE HEALTH INSURANCE | Attending: Student in an Organized Health Care Education/Training Program | Primary: Student in an Organized Health Care Education/Training Program

## 2021-05-10 ENCOUNTER — Ambulatory Visit
Admit: 2021-05-10 | Discharge: 2021-05-10 | Payer: PRIVATE HEALTH INSURANCE | Attending: Student in an Organized Health Care Education/Training Program | Primary: Student in an Organized Health Care Education/Training Program

## 2021-05-10 DIAGNOSIS — I10 Essential (primary) hypertension: Secondary | ICD-10-CM

## 2021-05-10 NOTE — Patient Instructions (Signed)
Please get your Flu Shot as we are now in flu seasone  PLEASE Take your blood pressure medications today   Keep scheduled follow up for your bone marrow biopsy.  If there are any issues, please call the clinic   See you in 3 months!

## 2021-05-10 NOTE — Progress Notes (Signed)
[facility]    Gabrielle Wells is a 70 y.o. female, PMHx: morbid obesity, resistant HTN, HFpEF, CKD, GERD, MGUS (follows Dr Bella Kennedy at [facility]).    Patient is here for evaluation of the following concerns: Headache   Last saw nephrology, Dr. Silvestre Moment on 02/20/2021    Pt reports headache onset 2 days ago, dull in quality, consistent, 4-6/10 in severity. Pain mainly to right frontal region with no radiation. No fever, no neck pain. Excessive movement would aggravate pain. Nothing really alleviates pain besides tylenol for a short period of time. She reports having elevated BP and has not taken her antihypertensive meds since yesterday. She reports She wont take NSAID's due to her kidney function. + chronic cough. No sputum production. Denies congestion. Denies sick contacts and lives alone.   Complains of generalized fatigue however this has been an ongoing problem for her due to her MGUS.       Review of Systems   Constitutional:  Positive for fatigue (chronic, unchanged). Negative for activity change, appetite change, chills, diaphoresis and fever.   HENT:  Negative for congestion, rhinorrhea, sneezing and sore throat.    Respiratory: Negative.     Cardiovascular: Negative.    Gastrointestinal: Negative.    Genitourinary: Negative.    Neurological:  Positive for headaches.   Hematological: Negative.      MEDICATIONS:  Prior to Visit Medications    Medication Sig Taking? Authorizing Provider   traMADol (ULTRAM) 50 MG tablet Take 50 mg by mouth every 6 hours as needed for Pain. Yes Historical Provider, MD   hydroCHLOROthiazide (HYDRODIURIL) 25 MG tablet TAKE ONE-HALF TABLET BY MOUTH DAILY Yes Hervey Ard, MD   carvedilol (COREG) 25 MG tablet TAKE ONE TABLET BY MOUTH TWICE DAILY Yes Hervey Ard, MD   gabapentin (NEURONTIN) 300 MG capsule Take 1 capsule by mouth 3 times daily for 90 days.  Patient taking differently: Take 300 mg by mouth at bedtime. Yes Josefina Do, MD    albuterol sulfate HFA (VENTOLIN HFA) 108 (90 Base) MCG/ACT inhaler Inhale 2 puffs into the lungs 4 times daily as needed for Wheezing Yes Adrian Saran, MD   hydrALAZINE (APRESOLINE) 50 MG tablet Take 1 tablet by mouth 3 times daily Yes Arshdeep Tindni, MD   omeprazole (PRILOSEC) 20 MG delayed release capsule Take 1 capsule by mouth every morning (before breakfast) Yes Jhonnie Garner, MD   amLODIPine (NORVASC) 10 MG tablet Take 1 tablet by mouth daily Yes Arshdeep Tindni, MD   Cholecalciferol (VITAMIN D3) 25 MCG TABS TAKE TWO TABLETS BY MOUTH DAILY Yes Hina Virk, MD   DULoxetine (CYMBALTA) 30 MG extended release capsule TAKE ONE CAPSULE BY MOUTH DAILY Yes Hina Virk, MD   atorvastatin (LIPITOR) 40 MG tablet Take 1 tablet by mouth daily Yes Freddie Apley, MD   furosemide (LASIX) 20 MG tablet Take 1 tablet by mouth daily as needed (Fluid retention. Weight gain of more than 5lbs in a day) Yes Arshdeep Tindni, MD   tiZANidine (ZANAFLEX) 2 MG tablet Take 1 tablet by mouth nightly as needed (muscle spasms) Yes Shirlee More, MD   acetaminophen (TYLENOL) 500 MG tablet Take 500 mg by mouth every 6 hours as needed for Pain  Yes Historical Provider, MD   dicyclomine (BENTYL) 10 MG capsule TAKE ONE CAPSULE BY MOUTH FOUR TIMES DAILY  Patient not taking: Reported on 05/10/2021  Minna Antis, MD   senna (SENOKOT) 8.6 MG tablet Take 1 tablet by  mouth 2 times daily  Patient not taking: Reported on 05/10/2021  Minna Antis, MD        Vitals:    05/10/21 1358 05/10/21 1400   BP: (!) 152/79 (!) 150/84   Site: Right Lower Arm Left Lower Arm   Position: Sitting Sitting   Cuff Size: Large Adult Large Adult   Pulse: 62    Resp: 20    Temp: 97 ??F (36.1 ??C)    TempSrc: Temporal    SpO2: 97%    Weight: 270 lb 1.6 oz (122.5 kg)    Height: 5\' 3"  (1.6 m)       Estimated body mass index is 47.85 kg/m?? as calculated from the following:    Height as of this encounter: 5\' 3"  (1.6 m).    Weight as of this encounter: 270 lb 1.6 oz (122.5  kg).  Physical Exam  Constitutional:       Appearance: She is obese.   HENT:      Head: Normocephalic.      Mouth/Throat:      Mouth: Mucous membranes are moist.      Pharynx: Oropharynx is clear.   Eyes:      Pupils: Pupils are equal, round, and reactive to light.   Cardiovascular:      Rate and Rhythm: Normal rate and regular rhythm.      Heart sounds: No murmur heard.  Pulmonary:      Effort: Pulmonary effort is normal. No respiratory distress.      Breath sounds: Normal breath sounds.   Abdominal:      General: There is no distension.      Palpations: Abdomen is soft.      Tenderness: There is no abdominal tenderness.   Musculoskeletal:      Right lower leg: Edema (trace at ankle) present.      Left lower leg: Edema (trace at ankle) present.   Skin:     General: Skin is warm.      Capillary Refill: Capillary refill takes less than 2 seconds.   Neurological:      General: No focal deficit present.      Mental Status: She is alert and oriented to person, place, and time.       ASSESSMENT/PLAN:     1. Resistant hypertension  Patient on hydrochlorothiazide, amlodipine, hydralazine, furosemide, carvedilol  Not too compliant with medications as she has not taken it today and does admit to missing some days  BP not optimal today 150/84 however has not taken her antihypertensives  Headache is on the right side and mild currently.  Not consistent with hypertensive urgency or emergency  -Please adhere to your current medications  -Informed patient that if she noticed any weakness or any neurological deficiencies please call the clinic or go to the nearest ED    2. Influenza vaccine needed  -     Influenza, FLUAD, (age 22 y+), IM, Preservative Free, 0.5 mL    3. MGUS (monoclonal gammopathy of unknown significance)  Elevated lambda light chain MGUS  Follows hematology-oncology [facility], Dr. Bella Kennedy.   Patient reports that she has a scheduled appointment tomorrow with her surgeon for possible biopsy    Return in about 3 months  (around 08/09/2021), or if symptoms worsen or fail to improve, for Follow-up.    The patient was staffed with the teaching attending: Dr. Joeseph Amor.    An electronic signature was used to authenticate this note.  _____________________  Veronia Beets  Hervey Ard, MD   Internal Medicine Resident, PGY-2  [facility]  [address] [place] [address]  Phone: [phone]

## 2021-05-11 ENCOUNTER — Ambulatory Visit
Admit: 2021-05-11 | Discharge: 2021-05-11 | Payer: PRIVATE HEALTH INSURANCE | Attending: Surgery | Primary: Student in an Organized Health Care Education/Training Program

## 2021-05-11 DIAGNOSIS — D472 Monoclonal gammopathy: Secondary | ICD-10-CM

## 2021-05-11 NOTE — Progress Notes (Signed)
[facility]  [address] [place] [address]  Phone: [phone]  Fax: [phone]    Visit Date: 05/14/2021    Subjective:   Gabrielle Wells is a 70 y.o. female referred by Dr. Bella Kennedy for fat pad biopsy as part of work-up for amyloidosis. As part of her renal workup for kidney disease she was found to have elevated lambda light chains. 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     Overall reports she is feeling ok. Having generalized pains. No nausea or vomiting.    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    s/i I&D and removaal of hardware    GERD (gastroesophageal reflux disease)     HTN (hypertension)     Hyperlipidemia     IBS (irritable bowel syndrome)     Lumbar radiculopathy     Morbid obesity (HCC)     Muscle fasciculation     Neuropathic pain     back 2/2 to spinal surgeries    SBO (small bowel obstruction) ([place]) 07/1999    with partial colectomy    Spinal fracture of T8 vertebra ([place])     s/p fusion     Surgical site infection     back, spinal hardware     Urinary incontinence      Past Surgical History:   Procedure Laterality Date    ABDOMINAL HERNIA REPAIR  2010    APPENDECTOMY      BREAST LUMPECTOMY Bilateral     benign lesions    CARDIAC CATHETERIZATION  2012    CHOLECYSTECTOMY      COLONOSCOPY      COLONOSCOPY  05/26/2020    COLONOSCOPY POLYPECTOMY SNARE/COLD BIOPSY performed by Alphonse Guild, MD at [place]  05/26/2020    COLONOSCOPY WITH BIOPSY performed by Alphonse Guild, MD at [place] BONE MARROW BIOPSY  01/03/2021    CT BONE MARROW BIOPSY 01/03/2021 [place] CT SCAN    HYSTERECTOMY, TOTAL ABDOMINAL (CERVIX REMOVED)      SHOULDER ARTHROPLASTY Right 2010    TOTAL KNEE ARTHROPLASTY Bilateral 2009    UPPER GASTROINTESTINAL ENDOSCOPY N/A 05/26/2020    EGD BIOPSY  performed by Alphonse Guild, MD at [place] History     Tobacco Use    Smoking status: Never    Smokeless tobacco: Never   Vaping Use    Vaping Use: Never used   Substance Use Topics    Alcohol use: Never    Drug use: Never     Family History   Problem Relation Age of Onset    Cancer Paternal Grandmother         unk        Allergies: Vancomycin, Aspirin, Ibuprofen, Mushroom extract complex, Shellfish allergy, Shellfish-derived products, Sulfa antibiotics, Sulfasalazine, Acetaminophen, Betadine [povidone iodine], Ciprofloxacin, Coconut oil, Codeine, Fish-derived products, Iodides, Iodine, Metronidazole, Other, Sulfacetamide, Amoxicillin-pot clavulanate, Coconut flavor, Eggs or egg-derived products, and Iodinated diagnostic agents  Current Outpatient Medications   Medication Sig Dispense Refill    traMADol (ULTRAM) 50 MG tablet Take 50 mg by mouth every 6 hours as needed for Pain.      hydroCHLOROthiazide (HYDRODIURIL) 25 MG tablet TAKE ONE-HALF TABLET BY MOUTH DAILY 15 tablet  0    carvedilol (COREG) 25 MG tablet TAKE ONE TABLET BY MOUTH TWICE DAILY 180 tablet 3    albuterol sulfate HFA (VENTOLIN HFA) 108 (90 Base) MCG/ACT inhaler Inhale 2 puffs into the lungs 4 times daily as needed for Wheezing 18 g 0    hydrALAZINE (APRESOLINE) 50 MG tablet Take 1 tablet by mouth 3 times daily 90 tablet 5    omeprazole (PRILOSEC) 20 MG delayed release capsule Take 1 capsule by mouth every morning (before breakfast) 90 capsule 1    amLODIPine (NORVASC) 10 MG tablet Take 1 tablet by mouth daily 30 tablet 5    Cholecalciferol (VITAMIN D3) 25 MCG TABS TAKE TWO TABLETS BY MOUTH DAILY 60 tablet 2    DULoxetine (CYMBALTA) 30 MG extended release capsule TAKE ONE CAPSULE BY MOUTH DAILY 30 capsule 2    atorvastatin (LIPITOR) 40 MG tablet Take 1 tablet by mouth daily 90 tablet 1    furosemide (LASIX) 20 MG tablet Take 1 tablet by mouth daily as needed (Fluid retention. Weight gain of more than 5lbs in a day) 30 tablet 3     tiZANidine (ZANAFLEX) 2 MG tablet Take 1 tablet by mouth nightly as needed (muscle spasms) 90 tablet 3    senna (SENOKOT) 8.6 MG tablet Take 1 tablet by mouth 2 times daily 60 tablet 11    acetaminophen (TYLENOL) 500 MG tablet Take 500 mg by mouth every 6 hours as needed for Pain       gabapentin (NEURONTIN) 300 MG capsule Take 1 capsule by mouth 3 times daily for 90 days. (Patient taking differently: Take 300 mg by mouth at bedtime.) 90 capsule 2    dicyclomine (BENTYL) 10 MG capsule TAKE ONE CAPSULE BY MOUTH FOUR TIMES DAILY (Patient not taking: No sig reported) 120 capsule 0     No current facility-administered medications for this visit.       Review of Systems: See HPI. All other systems reviewed and are negative.    Objective:     Vitals:    05/11/21 1125   BP: (!) 164/78   Site: Right Lower Arm   Pulse: 69   Temp: 98 ??F (36.7 ??C)   TempSrc: Temporal   Weight: 270 lb (122.5 kg)   Height: 5' 3"  (1.6 m)       Physical Exam:   General:  Alert, oriented x 3, cooperative, no distress, appears stated age.   Skin: Skin color, texture, turgor normal.    Lymph nodes: Cervical, supraclavicular, and axillary nodes normal.   HENT:  Normocephalic, without obvious abnormality. Moist mucus membranes. No icterus.   Lungs: No respiratory distress.   Heart:  Regular rate and rhythm. No murmur.    Abdomen: Soft, non-tender. No masses,  No organomegaly.   Extremities: Extremities normal, atraumatic, no cyanosis or edema.   Neurologic: Grossly intact.   Psychiatric: Appropriate mood and thought process       Labs:     Ref. Range 03/07/2020 15:37   KAPPA, FREE LIGHT CHAINS, SERUM Latest Ref Range: 3.30 - 19.40 mg/L 11.21   K/L RATIO Latest Ref Range: 0.26 - 1.65  0.02 (L)   KAPPA/LAMBDA TEST COMMENT Unknown see below   LAMBDA, FREE LIGHT CHAINS, SERUM Latest Ref Range: 5.71 - 26.30 mg/L 667.71 (H)       Lab Results   Component Value Date    WBC 8.1 04/23/2021    HGB 13.0 04/23/2021    HCT 40.3 04/23/2021    MCV  85.9 04/23/2021     PLT 113 (L) 04/23/2021     Lab Results   Component Value Date    NA 141 04/23/2021    K 4.2 04/23/2021    CL 105 04/23/2021    CO2 26 04/23/2021    BUN 9 04/23/2021    CREATININE 1.0 04/23/2021    GLUCOSE 125 (H) 04/23/2021    CALCIUM 9.3 04/23/2021    PROT 7.0 04/23/2021    LABALBU 4.1 04/23/2021    BILITOT <0.2 04/23/2021    ALKPHOS 78 04/23/2021    AST 16 04/23/2021    ALT 7 (L) 04/23/2021    LABGLOM 54 (A) 04/23/2021    GFRAA >60 04/23/2021    AGRATIO 1.0 (L) 05/08/2020    GLOB 4.2 05/08/2020       ECOG Performance Status   (2) Ambulatory and capable of self care, unable to carry out work activity, up and about > 50% or waking hours    Assessment/Plan:      Diagnosis Orders   1. Monoclonal gammopathy of unknown significance (MGUS)          I have personally reviewed and interpreted Alivya's lab results.     Pt requires a fat pad biopsy for confirmation of amyloidosis diagnosis. I explained obtaining a fat pad biopsy in the office with needle aspiration versus in the hospital with small fat pad excision.     All the complications including bleeding, infection, clots and wound healing were explained. Risks, benefits and alternatives of surgery explained to the patient. All the questions of the patient are answered to patient's apparent satisfaction. Patient verbalized understanding of the management plan.    Recommend follow-up with PCP for pre-op clearance for surgery.     Delila Pereyra MD  Surgery Attending

## 2021-05-14 NOTE — Progress Notes (Addendum)
Place patient label inside box (if no patient label, complete below)  Name:  DOB:  MR#:         PROCEDURAL INFORMED CONSENT FOR OPERATION / PROCEDURE  I (we), Gabrielle Wells, Gabrielle A. (Patient Name) authorize DR. Mikael Spray, MD (Provider / Proceduralist) and/or such assistants as may be selected by him/her, to perform the following operation/procedure(s): ABDOMINAL FAT PAD EXCISIONAL BIOPSY       Note: If unable to obtain consent prior to an emergent procedure, document the emergent reason in the medical record.       This procedure has been explained to my (our) satisfaction and included in the explanation was:  The intended benefit, nature, and extent of the procedure to be performed;  The significant risks involved and the probability of success;  Alternative procedures and methods of treatment;  The dangers and probable consequences of such alternatives (including no procedure or treatment);  The expected consequences of the procedure on my future health;  Whether other qualified individuals would be performing important surgical tasks and/or whether vendor representatives would be present to advise or support the procedure.    I (we) understand that there are other risks of infection and other serious complications in the pre-operative/procedural and postoperative/procedural stages of my (our) care.     I (we) have asked all of the questions which I (we) thought were important in deciding whether or not to undergo treatment or diagnosis. These questions have been answered to my (our) satisfaction.    I (we) understand that no assurance can be given that the procedure will be a success, and no guarantee or warranty of success has been given to me ([name]).    It has been explained to me ([name]) that during the course of the operation/procedure, unforeseen conditions may be revealed that necessitate extension of the original procedure(s) or different procedure(s) than those set forth in Paragraph 1. I (we) authorize  and request that the above-named physician, his/her assistants or his/her designees, perform procedures as necessary and desirable if deemed to be in my (our) best interest.     Revised 03/20/2020                                                                          Page 1 of 2           I acknowledge that health care personnel may be observing this procedure for the purpose of medical education or other specified purposes as may be necessary as requested and/or approved by my (our) physician.    I (we) consent to the disposal by the hospital Pathologist of the removed tissue, parts or organs in accordance with hospital policy.    I do ____ do not ____ consent to the use of a local infiltration pain blocking agent that will be used by my provider/surgical provider to help alleviate pain during my procedure.    I do ____ do not ____ consent to an emergent blood transfusion in the case of a life-threatening situation that requires blood components to be administered. This consent is valid for 24 hours from the beginning of the procedure.     This patient does ____ or does not ____ currently  have a DNR status/order. If DNR order is in place, obtain ???Addendum to the Surgical Consent for ALL Patients with a DNR Order??? to address peri-operative status for limited intervention or DNR suspension.     I have read and fully understand the above Consent for Operation/Procedure and that all blanks were completed before I signed the consent.   _____________________________       _____________________      ____/____am/pm  Signature of Patient or legal representative      Printed Name / Relationship            Date / Time   ____________________________       _____________________      ____/____am/pm  Witness to Signature                                    Printed Name                    Date / Time    If patient is unable to sign or is a minor, complete the following)  Patient is a minor, ____ years of age, or unable to sign  because:   ______________________________________________________________________________________________    If a phone consent is obtained, consent will be documented by using two health care professionals, each affirming that the consenting party has no questions and gives consent for the procedure discussed with the physician/provider.   _____________________          ____________________       _____/_____am/pm   2nd witness to phone consent        Printed name           Date / Time    Informed Consent:  I have provided the explanation described above in section 1 to the patient and/or legal representative. I have provided the patient and/or legal representative with an opportunity to ask any questions about the proposed operation/procedure.   ___________________________          ____________________         ____/____am/pm  Provider / Proceduralist                            Printed name            Date / Time  Revised 03/20/2020                                                                      Page 2 of 2

## 2021-05-14 NOTE — Telephone Encounter (Signed)
Dr. Hermenia Fiscal office will do the H&P for surgery on 05/16/2021

## 2021-05-14 NOTE — Telephone Encounter (Signed)
DONNA NURSE FROM [facility] CALLED RE: PREOP H/P. PT WAS SEEN ON 9-22 BUT IT WAS FOR SOMETHING ELSE. CAN THAT NOTE BE AMENDED TO USE FOR PREOP H//P? DONNA CAN BE REACHED AT [phone] PAT BACK LINE

## 2021-05-14 NOTE — Progress Notes (Signed)
[facility] PRE-SURGICAL TESTING INSTRUCTIONS                      PRIOR TO PROCEDURE DATE:    1. PLEASE FOLLOW ANY INSTRUCTIONS GIVEN TO YOU PER YOUR SURGEON.      2. Arrange for someone to drive you home and be with you for the first 24 hours after discharge for your safety after your procedure for which you received sedation. Ensure it is someone we can share information with regarding your discharge.     NOTE: At this time ONLY 2 ADULTS may accompany you & everyone must be MASKED.     3. You must contact your surgeon for instructions IF:  You are taking any blood thinners, aspirin, anti-inflammatory or vitamins.  There is a change in your physical condition such as a cold, fever, rash, cuts, sores, or any other infection, especially near your surgical site.    4. Do not drink alcohol the day before or day of your procedure.  Do not use any recreational marijuana at least 24 hours or street drugs (heroin, cocaine) at minimum 5 days prior to your procedure.     5. A Pre-Surgical History and Physical MUST be completed WITHIN 30 DAYS OR LESS prior to your procedure.by your Physician or an Urgent Care        THE DAY OF YOUR PROCEDURE:  1.  Follow instructions for ARRIVAL TIME as DIRECTED BY YOUR SURGEON.     2. Enter the MAIN entrance from [address] [employer] and follow the signs to the free Parking Garage or [employer] (offered free of charge 7 am-5pm).      3. Enter the Main Entrance of the hospital (do not enter from the lower level of the parking garage). Upon entrance, check in with the receptionist at the surgical information desk on your LEFT.   Bring your insurance card and photo ID to register      4. DO NOT EAT ANYTHING 8 hours prior to arrival for surgery.  You may have up to 8 ounces of water 4 hours prior to your arrival for surgery.   NOTE: ALL Gastric, Bariatric & Bowel surgery patients - you MUST follow your surgeon's instructions regarding eating/ drinking as you will have very specific  instructions to follow.  If you did not receive these, call your surgeon's office immediately.     5. MEDICATIONS:  Take the following medications with a SMALL sip of water: coreg, norvasc  Use your usual dose of inhalers the morning of surgery. BRING your rescue inhaler with you to hospital.   Anesthesia does NOT want you to take insulin the morning of surgery. They will control your blood sugar while you are at the hospital. Please contact your ordering physician for instructions regarding your insulin the night before your procedure. If you have an insulin pump, please keep it set on basal rate.   Bariatric patient's call your surgeon if on diabetic medications as some may need to be stopped 1 week prior to surgery    6. Do not swallow additional water when brushing teeth. No gum, candy, mints, or ice chips. Refrain from smoking or at least decrease the amount on day of surgery.    7. Morning of surgery:   Take a shower with an antibacterial soap (i.e., Safeguard or Dial) OR your physician may have instructed you to use Hibiclens.  Dress in loose, comfortable clothing appropriate for redressing after your procedure.  Do not wear jewelry (including body piercings), make-up (especially NO eye make-up), fingernail polish (NO toenail polish if foot/leg surgery), lotion, powders, or metal hairclips.   Do not shave or wax for 72 hours prior to procedure near your operative site. Shaving with a razor can irritate your skin and make it easier to develop an infection. On the day of your procedure, any hair that needs to be removed near the surgical site will be 'clipped' by a healthcare worker using a special clipper designed to avoid skin irritation.    8. Dentures, glasses, or contacts will need to be removed before your procedure. Bring cases for your glasses, contacts, dentures, or hearing aids to protect them while you are in surgery.      9. If you use a CPAP, please bring it with you on the day of your  procedure.    10. We recommend that valuable personal belongings such as cash, cell phones, e-tablets, or jewelry, be left at home during your stay. The hospital will not be responsible for valuables that are not secured in the hospital safe. However, if your insurance requires a co-pay, you may want to bring a method of payment, i.e., Check or credit card, if you wish to pay your co-pay the day of surgery.      11. If you are to stay overnight, you may bring a bag with personal items. Please have any large items you may need brought in by your family after your arrival to your hospital room.    12. If you have a Living Will or Durable Power of Attorney, please bring a copy on the day of your procedure.     How we keep you safe and work to prevent surgical site infections:   1. Health care workers should always check your ID bracelet to verify your name and birth date. You will be asked many times to state your name, date of birth, and allergies.    2. Health care workers should always clean their hands with soap or alcohol gel before providing care to you. It is okay to ask anyone if they cleaned their hands before they touch you.    3. You will be actively involved in verifying the type of procedure you are having and ensuring the correct surgical site. This will be confirmed multiple times prior to your procedure. Do NOT mark your surgery site UNLESS instructed to by your surgeon.     4. When you are in the operating room, your surgical site will be cleansed with a special soap, and in most cases, you will be given an antibiotic before the surgery begins.      What to expect AFTER your procedure?  1. Immediately following your procedure, your will be taken to the PACU for the first phase of your recovery.  Your nurse will help you recover from any potential side effects of anesthesia, such as extreme drowsiness, changes in your vital signs or breathing patterns. Nausea, headache, muscle aches, or sore throat may  also occur after anesthesia.  Your nurse will help you manage these potential side effects.    2. For comfort and safety, arrange to have someone at home with you for the first 24 hours after discharge.    3. You and your family will be given written instructions about your diet, activity, dressing care, medications, and return visits.     4. Once at home, should issues with nausea, pain, or bleeding occur, or should  you notice any signs of infection, you should call your surgeon.    5. Always clean your hands before and after caring for your wound. Do not let your family touch your surgery site without cleaning their hands.     6. Narcotic pain medications can cause significant constipation.  You may want to add a stool softener to your postoperative medication schedule or speak to your surgeon on how best to manage this SIDE EFFECT.    SPECIAL INSTRUCTIONS MAKE SURE YOU HAVE SOMEONE TO STAY WITH YOU FOR 24 HOURS AFTER SURGERY    Thank you for allowing [name] to care for you.  We strive to exceed your expectations in the delivery of care and service provided to you and your family.     If you need to contact the [place] staff for any reason, please call us at [phone]    Instructions reviewed with patient during preadmission testing phone interview.  Reynolds Bowl, RN.05/14/2021 .11:49 AM      ADDITIONAL EDUCATIONAL INFORMATION REVIEWED PER PHONE WITH YOU AND/OR YOUR FAMILY:  No Hibiclens?? Bathing Instructions   Yes Antibacterial Soap

## 2021-05-14 NOTE — Telephone Encounter (Signed)
Pt aware of arrival and procedure time: Yes  Arrival time: 0630 for 0830 procedure   Reminded to be NPO after midnight: Yes  Pre-op H&P completed with PCP: IM Resident clinic can get her in for H&P- if she can get a ride today/tomorrow she will get pre-op check done at clinic. If not, will be done DOS by Dr. Erasmo Leventhal or surg resident.   Lymphoscintigram scheduled if needed:  N/A  Pre-op labs completed (if needed):   N/A  Blood thinning medications reviewed and held if needed:  N/A  Bowel prep reviewed:  N/A  Has ride home from hospital: Yes

## 2021-05-14 NOTE — Progress Notes (Signed)
Call placed to Dr Allamaneni's office after completing PAT call. Pt indicated she would not have anyone with her for 24 hours after surgery. Spoke with Colletta [place] NP and updated her to pt situation.  Colletta [place] indicated she would call pt

## 2021-05-14 NOTE — Progress Notes (Signed)
Called patient to verify that Gabrielle Wells had called her about her H & P. Patient stated she did call her. Told her to be at the hospital at 0630 AM and surgery is planned at 0830.  Pt stated that she was told that her H & P would be done the morning of surgery since she does not have any transportation to get it done prior to DOS.  Pt also informed me that she was able to make arrangement for her brother to be with her the first 24 hours after surgery.

## 2021-05-16 ENCOUNTER — Inpatient Hospital Stay: Payer: PRIVATE HEALTH INSURANCE

## 2021-05-16 MED ORDER — CEFAZOLIN 3000 MG D5W 100 ML IVPB
Freq: Once | Status: AC
Start: 2021-05-16 — End: 2021-05-16
  Administered 2021-05-16: 13:00:00 3000 mg via INTRAVENOUS

## 2021-05-16 MED ORDER — BUPIVACAINE-EPINEPHRINE (PF) 0.5% -1:200000 IJ SOLN
INTRAMUSCULAR | Status: DC | PRN
Start: 2021-05-16 — End: 2021-05-16
  Administered 2021-05-16: 13:00:00 7

## 2021-05-16 MED ORDER — PROPOFOL 1000 MG/100ML IV EMUL
1000100 MG/100ML | INTRAVENOUS | Status: DC | PRN
Start: 2021-05-16 — End: 2021-05-16
  Administered 2021-05-16 (×2): 50 via INTRAVENOUS

## 2021-05-16 MED ORDER — ROCURONIUM BROMIDE 50 MG/5ML IV SOLN
50 MG/5ML | INTRAVENOUS | Status: AC
Start: 2021-05-16 — End: ?

## 2021-05-16 MED ORDER — NORMAL SALINE FLUSH 0.9 % IV SOLN
0.9 % | INTRAVENOUS | Status: DC | PRN
Start: 2021-05-16 — End: 2021-05-16

## 2021-05-16 MED ORDER — HYDRALAZINE HCL 20 MG/ML IJ SOLN
20 MG/ML | INTRAMUSCULAR | Status: DC | PRN
Start: 2021-05-16 — End: 2021-05-16

## 2021-05-16 MED ORDER — PROCHLORPERAZINE EDISYLATE 10 MG/2ML IJ SOLN
10 MG/2ML | Freq: Once | INTRAMUSCULAR | Status: AC | PRN
Start: 2021-05-16 — End: 2021-05-16
  Administered 2021-05-16: 14:00:00 5 mg via INTRAVENOUS

## 2021-05-16 MED ORDER — DEXAMETHASONE SODIUM PHOSPHATE 4 MG/ML IJ SOLN
4 MG/ML | INTRAMUSCULAR | Status: AC
Start: 2021-05-16 — End: ?

## 2021-05-16 MED ORDER — FENTANYL CITRATE (PF) 100 MCG/2ML IJ SOLN
100 MCG/2ML | INTRAMUSCULAR | Status: DC | PRN
Start: 2021-05-16 — End: 2021-05-16
  Administered 2021-05-16 (×2): 50 via INTRAVENOUS

## 2021-05-16 MED ORDER — LACTATED RINGERS IV SOLN
INTRAVENOUS | Status: DC
Start: 2021-05-16 — End: 2021-05-16
  Administered 2021-05-16: 11:00:00 via INTRAVENOUS

## 2021-05-16 MED ORDER — ONDANSETRON HCL 4 MG/2ML IJ SOLN
4 MG/2ML | INTRAMUSCULAR | Status: AC
Start: 2021-05-16 — End: ?

## 2021-05-16 MED ORDER — SODIUM CHLORIDE 0.9 % IV SOLN
0.9 % | INTRAVENOUS | Status: DC | PRN
Start: 2021-05-16 — End: 2021-05-16

## 2021-05-16 MED ORDER — MEPERIDINE HCL 25 MG/ML IJ SOLN
25 MG/ML | INTRAMUSCULAR | Status: DC | PRN
Start: 2021-05-16 — End: 2021-05-16

## 2021-05-16 MED ORDER — FENTANYL CITRATE (PF) 100 MCG/2ML IJ SOLN
100 MCG/2ML | INTRAMUSCULAR | Status: AC
Start: 2021-05-16 — End: ?

## 2021-05-16 MED ORDER — SUCCINYLCHOLINE CHLORIDE 200 MG/10ML IV SOSY
200 MG/10ML | INTRAVENOUS | Status: AC
Start: 2021-05-16 — End: ?

## 2021-05-16 MED ORDER — SODIUM CHLORIDE 0.9 % IR SOLN
0.9 % | Status: DC | PRN
Start: 2021-05-16 — End: 2021-05-16
  Administered 2021-05-16: 13:00:00 1000

## 2021-05-16 MED ORDER — LIDOCAINE (CARDIAC) 100 MG/5ML IV SOLN (MIXTURES ONLY)
100 MG/5ML | INTRAVENOUS | Status: AC
Start: 2021-05-16 — End: ?

## 2021-05-16 MED ORDER — HYDROMORPHONE HCL 1 MG/ML IJ SOLN
1 MG/ML | INTRAMUSCULAR | Status: DC | PRN
Start: 2021-05-16 — End: 2021-05-16
  Administered 2021-05-16: 14:00:00 0.5 mg via INTRAVENOUS

## 2021-05-16 MED ORDER — NORMAL SALINE FLUSH 0.9 % IV SOLN
0.9 % | Freq: Two times a day (BID) | INTRAVENOUS | Status: DC
Start: 2021-05-16 — End: 2021-05-16

## 2021-05-16 MED ORDER — HYDROMORPHONE HCL 1 MG/ML IJ SOLN
1 MG/ML | INTRAMUSCULAR | Status: DC | PRN
Start: 2021-05-16 — End: 2021-05-16
  Administered 2021-05-16 (×2): 0.25 mg via INTRAVENOUS

## 2021-05-16 MED ORDER — BUPIVACAINE HCL (PF) 0.5 % IJ SOLN
0.5 % | INTRAMUSCULAR | Status: AC
Start: 2021-05-16 — End: ?

## 2021-05-16 MED ORDER — PROPOFOL 1000 MG/100ML IV EMUL
1000 MG/100ML | INTRAVENOUS | Status: AC
Start: 2021-05-16 — End: ?

## 2021-05-16 MED ORDER — LIDOCAINE (CARDIAC) 100 MG/5ML IV SOLN (MIXTURES ONLY)
100 MG/5ML | INTRAVENOUS | Status: DC | PRN
Start: 2021-05-16 — End: 2021-05-16
  Administered 2021-05-16: 13:00:00 50 via INTRAVENOUS

## 2021-05-16 MED ORDER — BUPIVACAINE-EPINEPHRINE (PF) 0.5% -1:200000 IJ SOLN
INTRAMUSCULAR | Status: AC
Start: 2021-05-16 — End: ?

## 2021-05-16 MED ORDER — ONDANSETRON HCL 4 MG/2ML IJ SOLN
4 MG/2ML | INTRAMUSCULAR | Status: DC | PRN
Start: 2021-05-16 — End: 2021-05-16
  Administered 2021-05-16: 13:00:00 4 via INTRAVENOUS

## 2021-05-16 MED ORDER — SUGAMMADEX SODIUM 200 MG/2ML IV SOLN
2002 MG/2ML | INTRAVENOUS | Status: AC
Start: 2021-05-16 — End: ?

## 2021-05-16 MED FILL — BRIDION 200 MG/2ML IV SOLN: 200 MG/2ML | INTRAVENOUS | Qty: 2

## 2021-05-16 MED FILL — ONDANSETRON HCL 4 MG/2ML IJ SOLN: 4 MG/2ML | INTRAMUSCULAR | Qty: 2

## 2021-05-16 MED FILL — DEXAMETHASONE SODIUM PHOSPHATE 4 MG/ML IJ SOLN: 4 mg/mL | INTRAMUSCULAR | Qty: 1

## 2021-05-16 MED FILL — FENTANYL CITRATE (PF) 100 MCG/2ML IJ SOLN: 100 MCG/2ML | INTRAMUSCULAR | Qty: 2

## 2021-05-16 MED FILL — SENSORCAINE-MPF 0.5 % IJ SOLN: 0.5 % | INTRAMUSCULAR | Qty: 120

## 2021-05-16 MED FILL — PROCHLORPERAZINE EDISYLATE 10 MG/2ML IJ SOLN: 10 MG/2ML | INTRAMUSCULAR | Qty: 2

## 2021-05-16 MED FILL — SENSORCAINE-MPF/EPINEPHRINE 0.5% -1:200000 IJ SOLN: INTRAMUSCULAR | Qty: 30

## 2021-05-16 MED FILL — CEFAZOLIN SODIUM 10 G IJ SOLR: 10 g | INTRAMUSCULAR | Qty: 0

## 2021-05-16 MED FILL — LIDOCAINE HCL (CARDIAC) 100 MG/5ML IV SOSY: 100 MG/5ML | INTRAVENOUS | Qty: 5

## 2021-05-16 MED FILL — ROCURONIUM BROMIDE 50 MG/5ML IV SOLN: 50 MG/5ML | INTRAVENOUS | Qty: 5

## 2021-05-16 MED FILL — HYDROMORPHONE HCL 1 MG/ML IJ SOLN: 1 MG/ML | INTRAMUSCULAR | Qty: 1

## 2021-05-16 MED FILL — DIPRIVAN 1000 MG/100ML IV EMUL: 1000 MG/100ML | INTRAVENOUS | Qty: 100

## 2021-05-16 MED FILL — SUCCINYLCHOLINE CHLORIDE 200 MG/10ML IV SOSY: 200 MG/10ML | INTRAVENOUS | Qty: 10

## 2021-05-16 NOTE — Progress Notes (Signed)
PACU Transfer to SDS    Vitals:    05/16/21 1005   BP: 132/60   Pulse: 62   Resp: 14   Temp: 97.5 ??F (36.4 ??C)   SpO2: 99%         Intake/Output Summary (Last 24 hours) at 05/16/2021 1011  Last data filed at 05/16/2021 1008  Gross per 24 hour   Intake 610 ml   Output --   Net 610 ml       Pain assessment:    Pain Level: 4    Patient transferred to care of SDS RN.    05/16/2021 10:11 AM    Pt became nauseated just as leaving pacu, meds given

## 2021-05-16 NOTE — Discharge Instructions (Addendum)
Discharge Instructions:    Diet:   You may resume a regular diet.    Wound Care:   Skin glue was used to cover your incision(s). It will fall off on its own in about 10 days. You may shower, but do not scrub the incision sites directly or soak (tub, pool, etc.).    Activity:   No heavy lifting greater than a milk jug until follow up.    Pain management:   Unless informed of any restrictions by your primary care physician, please use your preferred over-the-counter pain reliever as your primary pain medication.     Return Precautions:   Call/ Return to ED for increased redness, worsening pain, drainage from wound, fevers, or any other concerns about your incision or post op course.      Follow up with Dr. Erasmo Leventhal as needed.         [place]    There are potential side effects of anesthesia or sedation you may experience for the first 24 hours.  These side effects include:    Confusion or Memory loss, Dizziness, or Delayed Reaction Times   [x] A responsible person should be with you for the next 24 hours.  Do not operate any vehicles (automobiles, bicycles, motorcycles) or power tools or machinery for 24 hours.  Do not sign any legal documents or make any legal decisions for 24 hours. Do not drink alcohol for 24 hours or while taking narcotic pain medication.      Nausea    [x] Start with light diet and progress to your normal diet as you feel like eating. However, if you experience nausea or repeated episodes of vomiting which persist beyond 12-24 hours, notify your physician.  Once nausea has passed, remember to keep drinking fluids.    Difficulty Passing Urine  [x] Drink extra amounts of fluid today.  Notify your physician if you have not urinated within 8 hours after your procedure or you feel uncomfortable.      Irritated Throat from a Breathing Tube  [x] Drink extra amounts of fluid today.  Lozenges may help.    Muscle Aches  [x] You may experience some  generalized body aches as your muscles recover from medications used to relax them during surgery.  These will gradually subside.    ACTIVITY INSTRUCTIONS:  [x] Rest today. Increase activity as tolerated.  [x] No heavy lifting or strenuous activity  [x] No driving for 24 hours or while taking narcotic pain medications  [] Elevate operative limb  [] Sling to operative limb  [] Use Crutches  [] Use Walker  [x] Weight bearing: do not lift more than 8 pounds ( gallon of milk) until follow up (2 weeks)  [] Other:    WOUND DRESSING INSTRUCTIONS:  [x] May shower  [] May bathe  [] Keep dressing dry  [x] Do not remove dressing  [] Remove dressing on:  [] Leave steri strips in place  [x] Derma Bond dressing -Do Not apply liquid or ointment medications or any other product to your wound while the adhesive is in place.  These may loosen the film before your wound is healed. You may occasionally and briefly wet your wound in the shower.  After showering, gently blot you wound dry with a soft towel.  [] Drain Care  [x] Ice to operative site for 15-30 minutes of each hour while awake for 24-36 hours  [] Use cooling system as instructed  [] Post-op shoe-wear when up and about  [] Other-Wear bra continuously for 24-36 hours  [] Other:    MEDICATION INSTRUCTIONS:  Prescription(S) x  0  sent with you.  Use as directed.  When taking pain medications, you may experience the side effect of dizziness or drowsiness.  Do not drink alcohol or drive when taking these medications.    [x] Give the list of your medications to your primary care physician on your next visit. Keep your med list updated and carry it with in case of emergencies.    Narcotic pain medications can cause the side effect of significant constipation.  You may want to add a stool softener to your postoperative medication schedule or speak to your surgeon on how best to manage this side effect.    NARCOTIC SAFETY:  Your pain medicine is only for you to take.  Safely store your medicines.  Store  pills up high and out of reach of children and pets.  Ensure safety caps are snapped tightly  Keep track of how many pills you have left    Unused medication can be disposed of by taking them to a drop-off box or take-back program that is authorized by the [facility].  Access to a site near you can be found on the [employer] website ([place]).    If you have a CPAP machine, it is very important that you use it daily during all periods of sleep and daytime rest during your recovery at home.  Surgery and Anesthesia place a significant amount of stress on your body.  Using your CPAP will help keep you safe and lessen the negative effects of that stress.    FOLLOW-UP RECOVERY CARE:  [x] Call the office at [phone]  for follow-up appointment 2 weeks unless otherwise instructed and problems    Watch for these possible complications or symptoms:   Signs of INFECTION   > Fever over 100.4??     > Redness, swelling, hardness or warmth at the operative site   >Foul smelling or cloudy drainage at the operative site   Blood soaked dressing.  (Some oozing may be normal)  Numb, pale, blue, cold or tingling extremity    Notify your physician if they occur or you have prolonged anesthesia/sedation side effects.    Dr. Erasmo Leventhal      Date/time 05/16/2021 9:52 AM         PACU:  [phone]   M-F 700 AM - 7 PM      SAME DAY SERVICES:  [phone] M-F 7AM-6PM          PACU:  [phone]      SAME DAY SERVICES:  [phone]      (M-F 8am - 8pm)                    (M-F 6am - 6pm)        If you smoke STOP. We care about your health!    FAQs  (frequently asked questions)  About ???Surgical Site Infections???    What is a Surgical Site Infection (SSI)?   A surgical site infection is an infection that occurs after surgery in the part of the body where the surgery took place.  Most patients who have surgery do not develop an infection.  However, infections develop in about 1 to 3 out of every 100 patients who  have surgery.   Some common symptoms of a surgical site infection are:   Redness and pain around the area where you had surgery  Drainage of cloudy fluid from your surgical wound   Fever    Can SSIs be treated?   Yes.  Most surgical site infections can be treated with antibiotics.  The antibiotic given to you depends on the bacteria (germs) causing the infection.  Sometimes patients with SSIs also need another surgery to treat the infection.      What are some of the things that hospitals are doing to prevent SSIs?  To prevent SSIs, doctors, nurses and other healthcare providers:   Clean their hands and arms up to their elbows with an antiseptic agent just before the surgery.   Clean their hands with soap and water or an alcohol-based hand rub before and after caring for each patient.   May remove some of your hair immediately before your surgery using electric clippers if the hair is in the same area where the procedure will occur.  They should not shave you with a razor.   Wear special hair covers, masks, gowns, and gloves during surgery to keep the surgery area clean.  Give you antibiotics before your surgery starts.  In most cases, you should get antibiotics within 60 minutes before the surgery starts and the antibiotics should be stopped within 24 hours after surgery.  Clean the skin at the site of your surgery with a special soap that kills germs.     What can I do to help prevent SSIs?   Before you surgery:  Tell your doctor about other medical problems you may have.  Health problems such as allergies, diabetes, and obesity could affect your surgery and your treatment.    Quit smoking.  Patients who smoke get more infections.  Talk to your doctor about how you can quit before your surgery.  Do not shave near where you will have surgery.  Shaving with a razor can irritate your skin and make it easier to develop an infection.      At the time of your surgery:  Speak up if someone tries to shave you with a razor  before surgery.  Ask why you need to be shaved and talk with your surgeon if you have any concerns.   Ask if you will get antibiotics before surgery.    After your surgery:  Make sure that your healthcare providers clean their hands before examining you, either with soap and water or an alcohol-based hand rub.   IF YOU DO NOT SEE YOUR PROVIDERS CLEAN THEIR HANDS, PLEASE ASK THEM TO DO SO.   Family and friends who visit you should not touch the surgical wound or dressings.  Family and friends should clean their hands with soap and water or an alcohol-based hand rub before and after visiting you.  If you do not see them clean their hands, ask them to clean their hands.     What do I need to do when I go home from the hospital?  Before you go home, your doctor nurses should explain everything you need to know about taking care of your wound.  Make sure you understand how to care for your wound before you leave the hospital.    Always clean your hands before and after caring for your wound.    Before you go home, make sure you know who to contact if you have questions or problems after you get home.   If you have any symptoms of an infection, such as redness and pain at the surgery site, drainage, or fever, call your doctor immediately.     If you have additional questions, please ask your doctor or nurse.     Thank you  for allowing [name] to care for you.  We hope we have exceeded your expectations and provided a very good overall experience while taking care of you and your family.       If you have additional questions, please ask your doctor or nurse.     Thank you for allowing [name] to care for you.  We hope we have exceeded your expectations and provided a very good overall experience while taking care of you and your family.

## 2021-05-16 NOTE — Progress Notes (Signed)
Pt arrived form OR, s/p ABDOMINAL FAT PAD EXCISIONAL BIOPSY, report received form CRNA, pt c/o pain on arrival and fell back to sleep

## 2021-05-16 NOTE — Op Note (Signed)
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   OPERATIVE INDICATIONS: Patient presented with for abdominal fat pad excision to exclude diagnosis of amyloidosis. All the complications including bleeding were explained. Risks, benefits and alternatives of surgery explained to the patient and patient wishes to proceed with surgery.      DETAILS OF PROCEDURE: Patient was identified in the preoperative area and brought to the operating room. Patient was placed in supine position. Operative site was prepped and draped in a sterile manner. Timeout procedure was performed. Local anesthesia was infiltrated. Skin incision was given in RLQ of abdomen. Superficial tissue was divided with diathermy. Now dissection was done all around a large area of fat pad ~ 6 cm x 3 cm. The hemostasis achieved with diathermy. Fat was pulled out of the operative field and deep attachments divided. Specimen sent for pathology. Hemostasis checked and achieved. Subcutaneous tissue approximated with 3-0 Vicryl. Skin was closed with continuous 4-0 Monocryl subcuticular suture. Derma flex was applied. Patient tolerated procedure well.   I was present for the entire procedure.     Delila Pereyra MD  Surgical Oncologist      Electronically signed by Mikael Spray, MD on 05/16/2021 at 3:28 PM

## 2021-05-16 NOTE — Progress Notes (Signed)
Soda and crackers offered

## 2021-05-16 NOTE — Progress Notes (Signed)
Ambulatory Surgery/Procedure Discharge Note    Vitals:    05/16/21 1018   BP: (!) 141/76   Pulse: 56   Resp: 15   Temp: 97 ??F (36.1 ??C)   SpO2: 98%       In: 610 [P.O.:60; I.V.:550]  Out: - 120    Restroom use offered before discharge.  Yes    Pain assessment:  level of pain (1-10, 10 severe),   Pain Level: 4    Patient stated that patients pain level is tolerable for discharge.      Patient discharged to home/self care. Patient discharged via wheel chair by transporter to waiting family/S.O.       05/16/2021 10:57 AM

## 2021-05-16 NOTE — Progress Notes (Signed)
Arrived to SDs for BX on ABD. NPO. LOC x 4 denies c/o.abd pain , has chronic back pain, 5/10, SB with activity recovers well sat 100%.

## 2021-05-16 NOTE — Anesthesia Post-Procedure Evaluation (Signed)
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    Aldrete Phase I: Aldrete Score: 10    Aldrete Phase II: Aldrete Score: 10      Anesthesia Post Evaluation    Patient location during evaluation: PACU  Patient participation: complete - patient participated  Level of consciousness: awake  Pain score: 0  Airway patency: patent  Nausea & Vomiting: no nausea and no vomiting  Complications: no  Cardiovascular status: blood pressure returned to baseline  Respiratory status: acceptable  Hydration status: euvolemic  Multimodal analgesia pain management approach

## 2021-05-16 NOTE — H&P (Signed)
Gabrielle Wells    [id number]    [facility] Update H & P  Department of General Surgery   Surgical Service   Pre-operative History and Physical  Last H & P within the last 30 days.    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 female was undergoing workup for worsening renal function when she was found to have an elevated lambda light chain.  She presents today for  fat pad biopsy.    Illness Screening: Patient denies fever, chills, worsening cough, or close contact with sick individuals.        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    s/i I&D and removaal of hardware    GERD (gastroesophageal reflux disease)     HTN (hypertension)     Hyperlipidemia     IBS (irritable bowel syndrome)     Incontinence of urine     Lumbar radiculopathy     Morbid obesity with BMI of 45.0-49.9, adult (HCC)     Muscle fasciculation     Neuropathic pain     back 2/2 to spinal surgeries    SBO (small bowel obstruction) ([place]) 07/1999    with partial colectomy    Spinal fracture of T8 vertebra ([place])     s/p fusion     Surgical site infection     back, spinal hardware     Urinary incontinence      Past Surgical History:        Procedure Laterality Date    ABDOMINAL HERNIA REPAIR  2010    APPENDECTOMY      BREAST LUMPECTOMY Bilateral     benign lesions    CARDIAC CATHETERIZATION  2012    CHOLECYSTECTOMY      COLONOSCOPY      COLONOSCOPY  05/26/2020    COLONOSCOPY POLYPECTOMY SNARE/COLD BIOPSY performed by Alphonse Guild, MD at [facility]  05/26/2020    COLONOSCOPY WITH BIOPSY performed by Alphonse Guild, MD at [facility]  01/03/2021    CT  BONE MARROW BIOPSY 01/03/2021 [place] CT SCAN    HYSTERECTOMY, TOTAL ABDOMINAL (CERVIX REMOVED)      TOTAL KNEE ARTHROPLASTY Bilateral 2009    TOTAL SHOULDER ARTHROPLASTY W/ DISTAL CLAVICLE EXCISION Right 2010    UPPER GASTROINTESTINAL ENDOSCOPY N/A 05/26/2020    EGD BIOPSY performed by Alphonse Guild, MD at [facility] ENDOSCOPY       Medications Prior to Admission:      Prior to Admission medications    Medication Sig Start Date End Date Taking? Authorizing Provider   traMADol (ULTRAM) 50 MG tablet Take 50 mg by mouth every 6 hours as needed for Pain.  Patient not taking: Reported on 05/14/2021    Historical Provider, MD   hydroCHLOROthiazide (HYDRODIURIL) 25 MG tablet TAKE ONE-HALF TABLET BY MOUTH DAILY 03/08/21   Hervey Ard, MD   carvedilol (COREG) 25 MG tablet TAKE ONE TABLET BY MOUTH TWICE DAILY 02/09/21   Iona Beard  Konrad Penta, MD   gabapentin (NEURONTIN) 300 MG capsule Take 1 capsule by mouth 3 times daily for 90 days.  Patient taking differently: Take 300 mg by mouth at bedtime. 02/08/21 05/10/21  Josefina Do, MD   albuterol sulfate HFA (VENTOLIN HFA) 108 (90 Base) MCG/ACT inhaler Inhale 2 puffs into the lungs 4 times daily as needed for Wheezing 12/14/20   Adrian Saran, MD   hydrALAZINE (APRESOLINE) 50 MG tablet Take 1 tablet by mouth 3 times daily 12/07/20   [name], MD   omeprazole (PRILOSEC) 20 MG delayed release capsule Take 1 capsule by mouth every morning (before breakfast) 11/28/20   Jhonnie Garner, MD   amLODIPine (NORVASC) 10 MG tablet Take 1 tablet by mouth daily 11/16/20   [name], MD   Cholecalciferol (VITAMIN D3) 25 MCG TABS TAKE TWO TABLETS BY MOUTH DAILY  Patient not taking: Reported on 05/14/2021 10/26/20   Minna Antis, MD   DULoxetine (CYMBALTA) 30 MG extended release capsule TAKE ONE CAPSULE BY MOUTH DAILY 10/26/20   Minna Antis, MD   dicyclomine (BENTYL) 10 MG capsule TAKE ONE CAPSULE BY MOUTH FOUR TIMES DAILY  Patient not taking: No sig reported 10/26/20   Minna Antis, MD   atorvastatin (LIPITOR) 40 MG  tablet Take 1 tablet by mouth daily 10/04/20   Freddie Apley, MD   furosemide (LASIX) 20 MG tablet Take 1 tablet by mouth daily as needed (Fluid retention. Weight gain of more than 5lbs in a day) 08/22/20   [name], MD   tiZANidine (ZANAFLEX) 2 MG tablet Take 1 tablet by mouth nightly as needed (muscle spasms) 08/02/20   Shirlee More, MD   senna (SENOKOT) 8.6 MG tablet Take 1 tablet by mouth 2 times daily  Patient not taking: Reported on 05/14/2021 06/19/20 06/19/21  Minna Antis, MD   acetaminophen (TYLENOL) 500 MG tablet Take 500 mg by mouth every 6 hours as needed for Pain     Historical Provider, MD         Allergies:  Vancomycin, Aspirin, Ibuprofen, Mushroom extract complex, Shellfish allergy, Shellfish-derived products, Sulfa antibiotics, Sulfasalazine, Acetaminophen, Betadine [povidone iodine], Ciprofloxacin, Coconut oil, Codeine, Fish-derived products, Iodides, Iodine, Metronidazole, Other, Sulfacetamide, Amoxicillin-pot clavulanate, Coconut flavor, Eggs or egg-derived products, and Iodinated diagnostic agents    PHYSICAL EXAM:      BP 139/71    Pulse 66    Temp (!) 96 ??F (35.6 ??C) (Temporal)    Resp 18    Ht _0  (1.6 m)    Wt 269 lb 6.4 oz (122.2 kg)    SpO2 100%    BMI 47.72 kg/m??      Airway:  Airway patent with no audible stridor    Heart:  Regular rate and rhythm, No murmur noted    Lungs:  No increased work of breathing, good air exchange, clear to auscultation bilaterally, no crackles or wheezing    Abdomen:  Soft, non-distended, non-tender, no rebound tenderness or guarding, and no masses palpated    ASSESSMENT AND PLAN     Patient is a 70 y.o. female with above specified procedure planned.    1. Patient seen and focused exam done today- no new changes since last physical exam on 04/27/21    2.  Access to ancillary services are available per request of the provider.    Jaynie Collins, APRN - CNP     05/16/2021

## 2021-05-16 NOTE — Progress Notes (Signed)
Multiple IV attempts not successful.

## 2021-05-16 NOTE — Anesthesia Pre-Procedure Evaluation (Signed)
Department of Anesthesiology  Preprocedure Note       Name:  Gabrielle Wells   Age:  70 y.o.  DOB:  09/07/1950                                          MRN:  [id number]         Date:  05/16/2021      Surgeon: Juliann Mule):  Mikael Spray, MD    Procedure: Procedure(s):  ABDOMINAL FAT PAD EXCISIONAL BIOPSY    Medications prior to admission:   Prior to Admission medications    Medication Sig Start Date End Date Taking? Authorizing Provider   traMADol (ULTRAM) 50 MG tablet Take 50 mg by mouth every 6 hours as needed for Pain.  Patient not taking: Reported on 05/14/2021    Historical Provider, MD   hydroCHLOROthiazide (HYDRODIURIL) 25 MG tablet TAKE ONE-HALF TABLET BY MOUTH DAILY 03/08/21   Hervey Ard, MD   carvedilol (COREG) 25 MG tablet TAKE ONE TABLET BY MOUTH TWICE DAILY 02/09/21   Hervey Ard, MD   gabapentin (NEURONTIN) 300 MG capsule Take 1 capsule by mouth 3 times daily for 90 days.  Patient taking differently: Take 300 mg by mouth at bedtime. 02/08/21 05/16/21  Josefina Do, MD   albuterol sulfate HFA (VENTOLIN HFA) 108 (90 Base) MCG/ACT inhaler Inhale 2 puffs into the lungs 4 times daily as needed for Wheezing 12/14/20   Adrian Saran, MD   hydrALAZINE (APRESOLINE) 50 MG tablet Take 1 tablet by mouth 3 times daily 12/07/20   [name], MD   omeprazole (PRILOSEC) 20 MG delayed release capsule Take 1 capsule by mouth every morning (before breakfast) 11/28/20   Jhonnie Garner, MD   amLODIPine (NORVASC) 10 MG tablet Take 1 tablet by mouth daily 11/16/20   [name], MD   Cholecalciferol (VITAMIN D3) 25 MCG TABS TAKE TWO TABLETS BY MOUTH DAILY  Patient not taking: No sig reported 10/26/20   Minna Antis, MD   DULoxetine (CYMBALTA) 30 MG extended release capsule TAKE ONE CAPSULE BY MOUTH DAILY 10/26/20   Minna Antis, MD   dicyclomine (BENTYL) 10 MG capsule TAKE ONE CAPSULE BY MOUTH FOUR TIMES DAILY  Patient not taking: No sig reported 10/26/20   Minna Antis, MD   atorvastatin (LIPITOR) 40 MG tablet Take 1 tablet by  mouth daily 10/04/20   Freddie Apley, MD   furosemide (LASIX) 20 MG tablet Take 1 tablet by mouth daily as needed (Fluid retention. Weight gain of more than 5lbs in a day) 08/22/20   [name], MD   tiZANidine (ZANAFLEX) 2 MG tablet Take 1 tablet by mouth nightly as needed (muscle spasms) 08/02/20   Shirlee More, MD   senna (SENOKOT) 8.6 MG tablet Take 1 tablet by mouth 2 times daily  Patient not taking: Reported on 05/14/2021 06/19/20 06/19/21  Minna Antis, MD   acetaminophen (TYLENOL) 500 MG tablet Take 500 mg by mouth every 6 hours as needed for Pain     Historical Provider, MD       Current medications:    No current facility-administered medications for this visit.     No current outpatient medications on file.     Facility-Administered Medications Ordered in Other Visits   Medication Dose Route Frequency Provider Last Rate Last Admin   ??? ceFAZolin (ANCEF) 3000 mg in dextrose 5 %  100 mL IVPB  3,000 mg IntraVENous Once Mikael Spray, MD       ??? lactated ringers infusion   IntraVENous Continuous Sherrilee Gilles, DO           Allergies:    Allergies   Allergen Reactions   ??? Vancomycin Other (See Comments)     Drug induced NEUTROPENIA  Drug induced NEUTROPENIA  Drug induced NEUTROPENIA    Other reaction(s): Other (See Comments), Other (See Comments)  Drug induced NEUTROPENIA  Drug induced NEUTROPENIA  Drug induced NEUTROPENIA  Drug induced NEUTROPENIA  Drug induced NEUTROPENIA  Drug induced NEUTROPENIA   ??? Aspirin Other (See Comments)     Stomach bleeding  Stomach bleeding  Stomach bleeding    Other reaction(s): Other (See Comments), Other (see comments), Other (See Comments)  Stomach bleeding  Stomach bleeding  Stomach bleeding  Stomach bleeding  Stomach bleeding  Stomach bleeding   ??? Ibuprofen      Other reaction(s): Other (See Comments)  Other reaction(s): Other (See Comments), Other (see comments)  Stomach bleeding  Stomach bleeding  Stomach bleeding   ??? Mushroom Extract Complex Hives  and Itching   ??? Shellfish Allergy Hives and Itching   ??? Shellfish-Derived Products Hives and Itching   ??? Sulfa Antibiotics Hives, Itching and Nausea And Vomiting   ??? Sulfasalazine Hives and Itching   ??? Acetaminophen Itching   ??? Betadine [Povidone Iodine]    ??? Ciprofloxacin Other (See Comments)     Made "bottom" raw  Made "bottom" raw    Other reaction(s): Other (See Comments), Other (see comments)  Made "bottom" raw  Made "bottom" raw  Made "bottom" raw  Made "bottom" raw   ??? Coconut Oil    ??? Codeine    ??? Fish-Derived Products    ??? Iodides    ??? Iodine    ??? Metronidazole Other (See Comments)     Made "bottom" raw  Made "bottom" raw    Other reaction(s): Other (See Comments), Other (see comments)  Made "bottom" raw  Made "bottom" raw  Made "bottom" raw  Made "bottom" raw   ??? Other      detergents   ??? Sulfacetamide    ??? Amoxicillin-Pot Clavulanate Itching     Has never had problems with amoxicillin/ penicillin    HAS TOLERATED ZOSYN WITH ZERO PROBLEMS  Has never had problems with amoxicillin/ penicillin    HAS TOLERATED ZOSYN WITH ZERO PROBLEMS    Has never had problems with amoxicillin/ penicillin    HAS TOLERATED ZOSYN WITH ZERO PROBLEMS  Has never had problems with amoxicillin/ penicillin    HAS TOLERATED ZOSYN WITH ZERO PROBLEMS  Has never had problems with amoxicillin/ penicillin    HAS TOLERATED ZOSYN WITH ZERO PROBLEMS  Has never had problems with amoxicillin/ penicillin    HAS TOLERATED ZOSYN WITH ZERO PROBLEMS   ??? Coconut Flavor Itching   ??? Eggs Or Egg-Derived Products Nausea And Vomiting   ??? Iodinated Diagnostic Agents Hives     Takes Benadryl 75m PO before receiving iodinated contrast  Takes Benadryl 566mPO before receiving iodinated contrast  Takes Benadryl 5023mO before receiving iodinated contrast  Takes Benadryl 31m2m before receiving iodinated contrast    Takes Benadryl 31mg22mbefore receiving iodinated contrast  Takes Benadryl 31mg 19mefore receiving iodinated contrast  Takes Benadryl 31mg P60mefore receiving iodinated contrast         Problem List:    Patient Active Problem List  Diagnosis Code   ??? 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 Resistant hypertension I10   ??? GERD (gastroesophageal reflux disease) K21.9   ??? Back wound S21.209A   ??? AKI (acute kidney injury) ([place]) N17.9   ??? 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 Vitamin D deficiency E55.9   ??? Hyperlipidemia E78.5   ??? Morbid obesity ([place]) E66.01   ??? BPPV (benign paroxysmal positional vertigo) H81.10   ??? Fall at home, initial encounter W19.XXXA, Y92.009   ??? 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   ??? Suspected condition R69   ??? Acute blood loss anemia D62   ??? Backache M54.9   ??? Benign essential HTN I10   ??? Chronic pain disorder G89.4   ??? Community acquired pneumonia J18.9   ??? CSF leak G96.00   ??? Depression F32.A   ??? Diarrhea R19.7   ??? Discitis of lumbosacral region M46.47   ??? Elevated lipase R74.8   ??? Fever and chills R50.9   ??? Fracture of neck of left humerus, closed, initial encounter S42.212A   ??? Fracture of T10 vertebra (HCC) S22.079A   ??? Generalized anxiety disorder F41.1   ??? 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 Proteus infection A49.8   ??? Humerus fracture S42.309A   ??? Hypotensive episode I95.9   ??? Intertrigo L30.4   ??? Obstipation K59.00   ??? Klebsiella infection A49.8   ??? Labile blood pressure R09.89   ??? Leukopenia D72.819   ??? 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 Neuropathic pain M79.2   ??? Obesity hypoventilation syndrome (HCC) E66.2   ??? Other specified anemias D64.89   ??? Pain R52   ??? Physical deconditioning R53.81   ??? Pill dysphagia R13.10   ??? Postoperative pain G89.18   ??? Protein-calorie malnutrition, severe (HCC) E43   ??? Pruritic condition L29.9   ??? Zoster B02.9   ??? Wound infection after surgery  T81.49XA       Past Medical History:        Diagnosis Date   ??? Arthritis    ??? Asthma    ??? CHF (congestive heart failure) ([place])    ??? 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 GERD (gastroesophageal reflux disease)    ??? HTN (hypertension)    ??? Hyperlipidemia    ??? IBS (irritable bowel syndrome)    ??? Incontinence of urine    ??? Lumbar radiculopathy    ??? Morbid obesity with BMI of 45.0-49.9, adult ([place])    ??? Muscle fasciculation    ??? Neuropathic pain     back 2/2 to spinal surgeries   ??? SBO (small bowel obstruction) ([place]) 07/1999    with partial colectomy   ??? Spinal fracture of T8 vertebra (HCC)     s/p fusion    ??? Surgical site infection     back, spinal hardware    ??? Urinary incontinence        Past Surgical History:        Procedure Laterality Date   ??? ABDOMINAL HERNIA REPAIR  2010   ??? APPENDECTOMY     ??? BREAST LUMPECTOMY Bilateral     benign lesions   ??? CARDIAC CATHETERIZATION  2012   ??? CHOLECYSTECTOMY     ??? COLONOSCOPY     ??? COLONOSCOPY  05/26/2020    COLONOSCOPY POLYPECTOMY SNARE/COLD BIOPSY performed by Alphonse Guild, MD at [place]   ??? COLONOSCOPY  05/26/2020    COLONOSCOPY WITH BIOPSY performed by Alphonse Guild, MD at [place]   ??? CT BONE MARROW BIOPSY  01/03/2021    CT BONE MARROW BIOPSY 01/03/2021 [place] CT SCAN   ??? HYSTERECTOMY, TOTAL ABDOMINAL (CERVIX REMOVED)     ??? TOTAL KNEE ARTHROPLASTY Bilateral 2009   ??? TOTAL SHOULDER ARTHROPLASTY W/ DISTAL CLAVICLE EXCISION Right 2010   ??? UPPER GASTROINTESTINAL ENDOSCOPY N/A 05/26/2020    EGD BIOPSY performed by Alphonse Guild, MD at [facility] History:    Social History     Tobacco Use   ??? Smoking status: Never   ??? Smokeless tobacco: Never   Substance Use Topics   ??? Alcohol use: Never                                Counseling given: Not Answered      Vital Signs (Current):   There were no vitals filed for this visit.                                           BP Readings from Last 3 Encounters:   05/16/21 139/71    05/11/21 (!) 164/78   05/10/21 (!) 150/84       NPO Status:                                                                                 BMI:   Wt Readings from Last 3 Encounters:   05/16/21 269 lb 6.4 oz (122.2 kg)   05/11/21 270 lb (122.5 kg)   05/10/21 270 lb 1.6 oz (122.5 kg)     There is no height or weight on file to calculate BMI.    CBC:   Lab Results   Component Value Date/Time    WBC 8.1 04/23/2021 10:46 PM    RBC 4.69 04/23/2021 10:46 PM    HGB 13.0 04/23/2021 10:46 PM    HCT 40.3 04/23/2021 10:46 PM    MCV 85.9 04/23/2021 10:46 PM    RDW 14.2 04/23/2021 10:46 PM    PLT 113 04/23/2021 10:46 PM       CMP:   Lab Results   Component Value Date/Time    NA 141 04/23/2021 10:46 PM    K 4.2 04/23/2021 10:46 PM    CL 105 04/23/2021 10:46 PM    CO2 26 04/23/2021 10:46 PM    BUN 9 04/23/2021 10:46 PM    CREATININE 1.0 04/23/2021 10:46 PM    GFRAA >60 04/23/2021 10:46 PM    AGRATIO 1.0 05/08/2020 05:20 PM    LABGLOM 54 04/23/2021 10:46 PM    GLUCOSE 125 04/23/2021 10:46 PM    PROT 7.0 04/23/2021 10:46 PM    CALCIUM  9.3 04/23/2021 10:46 PM    BILITOT <0.2 04/23/2021 10:46 PM    ALKPHOS 78 04/23/2021 10:46 PM    AST 16 04/23/2021 10:46 PM    ALT 7 04/23/2021 10:46 PM       POC Tests: No results for input(s): POCGLU, POCNA, POCK, POCCL, POCBUN, POCHEMO, POCHCT in the last 72 hours.    Coags:   Lab Results   Component Value Date/Time    PROTIME 10.7 01/03/2021 07:41 AM    INR 0.95 01/03/2021 07:41 AM    APTT 32.5 01/03/2021 07:41 AM       HCG (If Applicable): No results found for: PREGTESTUR, PREGSERUM, HCG, HCGQUANT     ABGs: No results found for: PHART, PO2ART, PCO2ART, HCO3ART, BEART, O2SATART     Type & Screen (If Applicable):  No results found for: LABABO, LABRH    Drug/Infectious Status (If Applicable):  No results found for: HIV, HEPCAB    COVID-19 Screening (If Applicable):   Lab Results   Component Value Date/Time    COVID19 NOT DETECTED 04/23/2021 11:51 PM           Anesthesia Evaluation  Patient summary  reviewed and Nursing notes reviewed no history of anesthetic complications:   Airway: Mallampati: III  TM distance: >3 FB   Neck ROM: full  Mouth opening: > = 3 FB   Dental: normal exam         Pulmonary:   (+) decreased breath sounds: bilateral asthma:     (-) pneumonia                           Cardiovascular:  Exercise tolerance: poor (<4 METS),   (+) hypertension:, CHF: no interval change, hyperlipidemia        Rhythm: regular  Rate: normal           Beta Blocker:  Dose within 24 Hrs         Neuro/Psych:   (+) psychiatric history:   (-) neuromuscular disease           GI/Hepatic/Renal:   (+) GERD:, renal disease: CRI, morbid obesity          Endo/Other:    (+) : arthritis:., .                 Abdominal:   (+) obese,     Abdomen: soft.      Vascular:          Other Findings:             Anesthesia Plan      general     ASA 3     (  )  Induction: intravenous.    MIPS: Postoperative opioids intended and Prophylactic antiemetics administered.  Anesthetic plan and risks discussed with patient.      Plan discussed with CRNA.    Attending anesthesiologist reviewed and agrees with Preprocedure content          Loraine Grip, DO   05/16/2021

## 2021-05-16 NOTE — Brief Op Note (Signed)
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]    Post-Op Diagnosis: Same       Procedure(s):  ABDOMINAL FAT PAD EXCISIONAL BIOPSY    Surgeon(s):  Mikael Spray, MD    Assistant:  Surgical Assistant: Orrin Brigham    Anesthesia: Choice    Estimated Blood Loss (mL): Minimal    Complications: None    Specimens:   ID Type Source Tests Collected by Time Destination   A : ABDOMINAL FAT PAD RULE OUT AMYLOIDOSIS Tissue Tissue SURGICAL PATHOLOGY Mikael Spray, MD 05/16/2021 [phone]        Implants:  * No implants in log *      Drains: * No LDAs found *    Findings: abdominal fat pad excised    Electronically signed by Mikael Spray, MD on 05/16/2021 at 9:32 AM

## 2021-05-21 NOTE — Telephone Encounter (Signed)
Refill Zofran  Last ov 05/10/21 [facility]  Next appt 08/16/21

## 2021-05-22 MED ORDER — ONDANSETRON 4 MG PO TBDP
4 MG | ORAL_TABLET | ORAL | 0 refills | Status: DC
Start: 2021-05-22 — End: 2021-07-05

## 2021-06-28 ENCOUNTER — Encounter: Attending: Nephrology | Primary: Student in an Organized Health Care Education/Training Program

## 2021-06-28 ENCOUNTER — Ambulatory Visit
Payer: PRIVATE HEALTH INSURANCE | Attending: Student in an Organized Health Care Education/Training Program | Primary: Student in an Organized Health Care Education/Training Program

## 2021-07-05 ENCOUNTER — Ambulatory Visit
Admit: 2021-07-05 | Discharge: 2021-07-05 | Payer: PRIVATE HEALTH INSURANCE | Attending: Student in an Organized Health Care Education/Training Program | Primary: Student in an Organized Health Care Education/Training Program

## 2021-07-05 DIAGNOSIS — Z23 Encounter for immunization: Secondary | ICD-10-CM

## 2021-07-05 MED ORDER — TIZANIDINE HCL 2 MG PO TABS
2 MG | ORAL_TABLET | Freq: Every evening | ORAL | 1 refills | Status: DC | PRN
Start: 2021-07-05 — End: 2021-07-16

## 2021-07-05 MED ORDER — ONDANSETRON 4 MG PO TBDP
4 MG | ORAL_TABLET | ORAL | 0 refills | Status: AC
Start: 2021-07-05 — End: 2021-07-16

## 2021-07-05 MED ORDER — TRAMADOL HCL 50 MG PO TABS
50 MG | ORAL_TABLET | Freq: Four times a day (QID) | ORAL | 0 refills | Status: AC | PRN
Start: 2021-07-05 — End: 2021-07-10

## 2021-07-05 MED ORDER — ALBUTEROL SULFATE HFA 108 (90 BASE) MCG/ACT IN AERS
10890 (90 Base) MCG/ACT | Freq: Four times a day (QID) | RESPIRATORY_TRACT | 0 refills | Status: DC | PRN
Start: 2021-07-05 — End: 2021-07-16

## 2021-07-05 MED ORDER — HANDICAP PLACARD
0 refills | Status: DC
Start: 2021-07-05 — End: 2022-11-26

## 2021-07-05 MED ORDER — FUROSEMIDE 40 MG PO TABS
40 MG | ORAL_TABLET | Freq: Every day | ORAL | 0 refills | Status: AC
Start: 2021-07-05 — End: 2021-08-04

## 2021-07-05 NOTE — Progress Notes (Signed)
Pre-Op Examination    :  Gabrielle Wells                                               DOB: 02-12-1951  Age: 70 y.o.  MRN: [id number]  Date : 07/05/2021    Referring Physician: Mickel Baas L. Kennith Gain, MD    Procedure: Phacoemulsification with intraocular lens implant     HISTORY OF PRESENT ILLNESS:   The patient is a 70 y.o. female who presents for preoperative examination.    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    s/i I&D and removaal of hardware    GERD (gastroesophageal reflux disease)     HTN (hypertension)     Hyperlipidemia     IBS (irritable bowel syndrome)     Incontinence of urine     Lumbar radiculopathy     Morbid obesity with BMI of 45.0-49.9, adult ([place])     Muscle fasciculation     Neuropathic pain     back 2/2 to spinal surgeries    SBO (small bowel obstruction) ([place]) 07/1999    with partial colectomy    Spinal fracture of T8 vertebra (HCC)     s/p fusion     Surgical site infection     back, spinal hardware     Urinary incontinence        Past Surgical History:        Procedure Laterality Date    ABDOMEN SURGERY N/A 05/16/2021    ABDOMINAL FAT PAD EXCISIONAL BIOPSY performed by Mikael Spray, MD at [facility]  2010    APPENDECTOMY      BREAST LUMPECTOMY Bilateral     benign lesions    CARDIAC CATHETERIZATION  2012    CHOLECYSTECTOMY      COLONOSCOPY      COLONOSCOPY  05/26/2020    COLONOSCOPY POLYPECTOMY SNARE/COLD BIOPSY performed by Alphonse Guild, MD at [facility]  05/26/2020    COLONOSCOPY WITH BIOPSY performed by Alphonse Guild, MD at [facility]  01/03/2021    CT BONE MARROW BIOPSY 01/03/2021 [place] CT SCAN    HYSTERECTOMY, TOTAL ABDOMINAL (CERVIX  REMOVED)      TOTAL KNEE ARTHROPLASTY Bilateral 2009    TOTAL SHOULDER ARTHROPLASTY W/ DISTAL CLAVICLE EXCISION Right 2010    UPPER GASTROINTESTINAL ENDOSCOPY N/A 05/26/2020    EGD BIOPSY performed by Alphonse Guild, MD at [facility] ENDOSCOPY       Family History:       Problem Relation Age of Onset    Cancer Paternal Grandmother         unk        Social History:   TOBACCO:   reports that she has never smoked. She has never used smokeless tobacco.  ETOH:   reports no history of alcohol use.  OCCUPATION:  Allergies:  Vancomycin, Aspirin, Ibuprofen, Mushroom extract complex, Shellfish allergy, Shellfish-derived products, Sulfa antibiotics, Sulfasalazine, Acetaminophen, Betadine [povidone iodine], Ciprofloxacin, Coconut oil, Codeine, Fish-derived products, Iodides, Iodine, Metronidazole, Other, Sulfacetamide, Amoxicillin-pot clavulanate, Coconut flavor, Eggs or egg-derived products, and Iodinated diagnostic agents    Current Medications:    Prior to Admission medications    Medication Sig Start Date End Date Taking? Authorizing Provider   ondansetron (ZOFRAN-ODT) 4 MG disintegrating tablet DISSOLVE 1 TABLET ON THE TONGUE EVERY 8 HOURS AS NEEDED FOR NAUSEA 05/22/21  Yes Hina Virk, MD   traMADol (ULTRAM) 50 MG tablet Take 50 mg by mouth every 6 hours as needed for Pain.   Yes Historical Provider, MD   hydroCHLOROthiazide (HYDRODIURIL) 25 MG tablet TAKE ONE-HALF TABLET BY MOUTH DAILY 03/08/21  Yes Hervey Ard, MD   carvedilol (COREG) 25 MG tablet TAKE ONE TABLET BY MOUTH TWICE DAILY 02/09/21  Yes Hervey Ard, MD   albuterol sulfate HFA (VENTOLIN HFA) 108 (90 Base) MCG/ACT inhaler Inhale 2 puffs into the lungs 4 times daily as needed for Wheezing 12/14/20  Yes Adrian Saran, MD   hydrALAZINE (APRESOLINE) 50 MG tablet Take 1 tablet by mouth 3 times daily 12/07/20  Yes Arshdeep Tindni, MD   omeprazole (PRILOSEC) 20 MG delayed release capsule Take 1 capsule by mouth every morning (before breakfast) 11/28/20  Yes Jhonnie Garner,  MD   amLODIPine (NORVASC) 10 MG tablet Take 1 tablet by mouth daily 11/16/20  Yes Arshdeep Tindni, MD   Cholecalciferol (VITAMIN D3) 25 MCG TABS TAKE TWO TABLETS BY MOUTH DAILY 10/26/20  Yes Hina Virk, MD   DULoxetine (CYMBALTA) 30 MG extended release capsule TAKE ONE CAPSULE BY MOUTH DAILY 10/26/20  Yes Hina Virk, MD   dicyclomine (BENTYL) 10 MG capsule TAKE ONE CAPSULE BY MOUTH FOUR TIMES DAILY 10/26/20  Yes Minna Antis, MD   atorvastatin (LIPITOR) 40 MG tablet Take 1 tablet by mouth daily 10/04/20  Yes Freddie Apley, MD   furosemide (LASIX) 20 MG tablet Take 1 tablet by mouth daily as needed (Fluid retention. Weight gain of more than 5lbs in a day) 08/22/20  Yes Arshdeep Tindni, MD   tiZANidine (ZANAFLEX) 2 MG tablet Take 1 tablet by mouth nightly as needed (muscle spasms) 08/02/20  Yes Shirlee More, MD   acetaminophen (TYLENOL) 500 MG tablet Take 500 mg by mouth every 6 hours as needed for Pain    Yes Historical Provider, MD   gabapentin (NEURONTIN) 300 MG capsule Take 1 capsule by mouth 3 times daily for 90 days.  Patient taking differently: Take 300 mg by mouth at bedtime. 02/08/21 05/16/21  Josefina Do, MD       REVIEW OF SYSTEMS:  Review of Systems   All other systems reviewed and are negative.      Physical Exam:      Vitals: BP (!) 184/113 (Site: Left Lower Arm, Position: Sitting, Cuff Size: Medium Adult)    Pulse 72    Temp (!) 96.4 ??F (35.8 ??C) (Temporal)    Resp 18    Wt 278 lb 1.6 oz (126.1 kg)    SpO2 99%    BMI 49.26 kg/m??     Body mass index is 49.26 kg/m??.  Wt Readings from Last 3 Encounters:   07/05/21 278 lb 1.6 oz (126.1 kg)   05/16/21 269 lb 6.4 oz (122.2 kg)   05/11/21 270 lb (122.5 kg)     Physical Exam  Constitutional:       Appearance: Normal  appearance. She is obese.   HENT:      Head: Normocephalic.      Right Ear: External ear normal.      Left Ear: External ear normal.      Nose: Nose normal.      Mouth/Throat:      Mouth: Mucous membranes are moist.   Eyes:      Pupils: Pupils are  equal, round, and reactive to light.   Cardiovascular:      Rate and Rhythm: Normal rate and regular rhythm.      Pulses: Normal pulses.      Heart sounds: Normal heart sounds.   Pulmonary:      Effort: Pulmonary effort is normal.      Breath sounds: Normal breath sounds.   Abdominal:      Palpations: Abdomen is soft.   Musculoskeletal:         General: Normal range of motion.   Skin:     General: Skin is warm.   Neurological:      General: No focal deficit present.      Mental Status: She is alert and oriented to person, place, and time.   Psychiatric:         Mood and Affect: Mood normal.         Thought Content: Thought content normal.       Radiology: chest Xray normal with no acute disease  EKG: normal sinus rhythm      /Plan:   Gabrielle Wells was seen today for pre-op exam.    Diagnoses and all orders for this visit:    Benign essential HTN    Preop examination    Other orders  -     Influenza, FLUAD, (age 71 y+), IM, Preservative Free, 0.5 mL          Known risk factors for perioperative complications: none       Pre-Operative Risk assessment using 2014 ACC/AHA guidelines     Emergent procedure NO  Active Cardiac Condition No (decompensated HF, Arrhythmia, MI <3 weeks, severe valve disease)  Risk Level of Procedure Low Risk (endoscopy, superficial skin, breast, ambulatory, or cataract, etc.)  Revised Cardiac Risk Index Risk factors: History of heart failure  Measurement of Exercise Tolerance before Surgery >4 No    According to the 2014 ACC/AHA pre-operative risk assessment guidelines Gabrielle Wells is a low risk for major cardiac complications during a low risk procedure and may continue as planned. Specific medication recommendations are listed below. Medications recommended to continue should be taken with a sip of water even when NPO.     Further recommendations from consultants: None    Medication Recommendations:        1. Preoperative workup as follows: Chest Xray, EKG and CBC, BMP and LFTs   2. Change in  medication regimen before surgery: none   3. Prophylaxis for cardiac events with perioperative beta-blockers:  none     Patient is considered stable for the upcoming procedure Phacoemulsification with intraocular lens implant pending BP improvement. Patient noticed to have elevated blood pressure as the patient was very anxious. Will start on lasix 40 mg daily and follow up in a week in the office to check for response.     Jason Fila, MD  07/05/2021, 3:06 PM

## 2021-07-05 NOTE — Patient Instructions (Addendum)
Keep up the good work   Follow up in 1 week   Start taking Lasix 40 mg one tablet by mouth daily

## 2021-07-10 NOTE — Telephone Encounter (Signed)
[place] [facility] NEED PREOP FAXED TO [phone]. [place] [phone]

## 2021-07-10 NOTE — Telephone Encounter (Signed)
errpr

## 2021-07-10 NOTE — Telephone Encounter (Signed)
Returned call to [place] left message H&P has been faxed

## 2021-07-16 ENCOUNTER — Ambulatory Visit
Admit: 2021-07-16 | Discharge: 2021-07-17 | Payer: PRIVATE HEALTH INSURANCE | Primary: Student in an Organized Health Care Education/Training Program

## 2021-07-16 DIAGNOSIS — I5032 Chronic diastolic (congestive) heart failure: Secondary | ICD-10-CM

## 2021-07-16 MED ORDER — ALBUTEROL SULFATE HFA 108 (90 BASE) MCG/ACT IN AERS
108 (90 Base) MCG/ACT | Freq: Four times a day (QID) | RESPIRATORY_TRACT | 0 refills | Status: AC | PRN
Start: 2021-07-16 — End: ?

## 2021-07-16 MED ORDER — ATORVASTATIN CALCIUM 40 MG PO TABS
40 MG | ORAL_TABLET | Freq: Every day | ORAL | 1 refills | Status: DC
Start: 2021-07-16 — End: 2021-09-03

## 2021-07-16 MED ORDER — DICYCLOMINE HCL 10 MG PO CAPS
10 MG | ORAL_CAPSULE | ORAL | 0 refills | Status: AC
Start: 2021-07-16 — End: 2022-04-30

## 2021-07-16 MED ORDER — ONDANSETRON 4 MG PO TBDP
4 MG | ORAL_TABLET | ORAL | 0 refills | Status: AC
Start: 2021-07-16 — End: ?

## 2021-07-16 MED ORDER — FUROSEMIDE 40 MG PO TABS
40 MG | ORAL_TABLET | Freq: Every day | ORAL | 0 refills | Status: DC
Start: 2021-07-16 — End: 2021-09-28

## 2021-07-16 MED ORDER — TIZANIDINE HCL 2 MG PO TABS
2 MG | ORAL_TABLET | Freq: Every evening | ORAL | 1 refills | Status: AC | PRN
Start: 2021-07-16 — End: 2023-09-02

## 2021-07-16 NOTE — Progress Notes (Signed)
Pre-Op Examination    :  Gabrielle Wells                                               DOB: 1950-11-13  Age: 70 y.o.  MRN: [id number]  Date : 07/16/2021    Referring Physician: Mickel Baas L. Kennith Gain, MD    Procedure: Phacoemulsification with intraocular lens implant     HISTORY OF PRESENT ILLNESS:   The patient is a 70 y.o. female who presents for preoperative examination.    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    s/i I&D and removaal of hardware    GERD (gastroesophageal reflux disease)     HTN (hypertension)     Hyperlipidemia     IBS (irritable bowel syndrome)     Incontinence of urine     Lumbar radiculopathy     Morbid obesity with BMI of 45.0-49.9, adult (HCC)     Muscle fasciculation     Neuropathic pain     back 2/2 to spinal surgeries    SBO (small bowel obstruction) ([place]) 07/1999    with partial colectomy    Spinal fracture of T8 vertebra (HCC)     s/p fusion     Surgical site infection     back, spinal hardware     Urinary incontinence        Past Surgical History:        Procedure Laterality Date    ABDOMEN SURGERY N/A 05/16/2021    ABDOMINAL FAT PAD EXCISIONAL BIOPSY performed by Mikael Spray, MD at [place]  2010    APPENDECTOMY      BREAST LUMPECTOMY Bilateral     benign lesions    CARDIAC CATHETERIZATION  2012    CHOLECYSTECTOMY      COLONOSCOPY      COLONOSCOPY  05/26/2020    COLONOSCOPY POLYPECTOMY SNARE/COLD BIOPSY performed by Alphonse Guild, MD at [place]  05/26/2020    COLONOSCOPY WITH BIOPSY performed by Alphonse Guild, MD at [place]  01/03/2021    CT BONE MARROW BIOPSY 01/03/2021 [place] CT SCAN    HYSTERECTOMY, TOTAL ABDOMINAL (CERVIX  REMOVED)      TOTAL KNEE ARTHROPLASTY Bilateral 2009    TOTAL SHOULDER ARTHROPLASTY W/ DISTAL CLAVICLE EXCISION Right 2010    UPPER GASTROINTESTINAL ENDOSCOPY N/A 05/26/2020    EGD BIOPSY performed by Alphonse Guild, MD at [facility] ENDOSCOPY       Family History:       Problem Relation Age of Onset    Cancer Paternal Grandmother         unk        Social History:   TOBACCO:   reports that she has never smoked. She has never used smokeless tobacco.  ETOH:   reports no history of alcohol use.  OCCUPATION:      Allergies:  Vancomycin, Aspirin, Ibuprofen, Mushroom extract complex, Shellfish allergy, Shellfish-derived products, Sulfa antibiotics, Sulfasalazine, Acetaminophen, Betadine [povidone iodine], Ciprofloxacin, Coconut oil, Codeine, Fish-derived products, Iodides, Iodine, Metronidazole, Other, Sulfacetamide, Amoxicillin-pot clavulanate, Coconut flavor, Eggs or egg-derived products, and Iodinated diagnostic agents    Current Medications:    Prior to Admission medications    Medication Sig Start Date End Date Taking? Authorizing Provider   furosemide (LASIX) 40 MG tablet Take 1 tablet by mouth daily 07/16/21 08/15/21 Yes Nemiah Commander, MD   albuterol sulfate HFA (VENTOLIN HFA) 108 (90 Base) MCG/ACT inhaler Inhale 2 puffs into the lungs 4 times daily as needed for Wheezing 07/16/21  Yes Nemiah Commander, MD   tiZANidine (ZANAFLEX) 2 MG tablet Take 1 tablet by mouth nightly as needed (muscle spasms) 07/16/21  Yes Nemiah Commander, MD   ondansetron (ZOFRAN-ODT) 4 MG disintegrating tablet DISSOLVE 1 TABLET ON THE TONGUE EVERY 8 HOURS AS NEEDED FOR NAUSEA 07/16/21  Yes Nemiah Commander, MD   hydroCHLOROthiazide (HYDRODIURIL) 25 MG tablet TAKE ONE-HALF TABLET BY MOUTH DAILY 03/08/21  Yes Hervey Ard, MD   carvedilol (COREG) 25 MG tablet TAKE ONE TABLET BY MOUTH TWICE DAILY 02/09/21  Yes Hervey Ard, MD   hydrALAZINE (APRESOLINE) 50 MG tablet Take 1 tablet by mouth 3 times daily 12/07/20  Yes Arshdeep Tindni, MD   omeprazole  (PRILOSEC) 20 MG delayed release capsule Take 1 capsule by mouth every morning (before breakfast) 11/28/20  Yes Jhonnie Garner, MD   amLODIPine (NORVASC) 10 MG tablet Take 1 tablet by mouth daily 11/16/20  Yes Arshdeep Tindni, MD   Cholecalciferol (VITAMIN D3) 25 MCG TABS TAKE TWO TABLETS BY MOUTH DAILY 10/26/20  Yes Hina Virk, MD   dicyclomine (BENTYL) 10 MG capsule TAKE ONE CAPSULE BY MOUTH FOUR TIMES DAILY 10/26/20  Yes Minna Antis, MD   atorvastatin (LIPITOR) 40 MG tablet Take 1 tablet by mouth daily 10/04/20  Yes Freddie Apley, MD   acetaminophen (TYLENOL) 500 MG tablet Take 500 mg by mouth every 6 hours as needed for Pain    Yes Historical Provider, MD   Handicap Placard MISC by Does not apply route 07/05/2021-07/05/2024 07/05/21   Jason Fila, MD   gabapentin (NEURONTIN) 300 MG capsule Take 1 capsule by mouth 3 times daily for 90 days.  Patient taking differently: Take 300 mg by mouth at bedtime. 02/08/21 05/16/21  Josefina Do, MD   DULoxetine (CYMBALTA) 30 MG extended release capsule TAKE ONE CAPSULE BY MOUTH DAILY  Patient not taking: Reported on 07/16/2021 10/26/20   Minna Antis, MD       REVIEW OF SYSTEMS:  Review of Systems   Constitutional: Negative.    HENT: Negative.     Cardiovascular: Negative.    Gastrointestinal: Negative.    Genitourinary: Negative.    Neurological: Negative.        Physical Exam:      Vitals: BP 131/77 (Site: Left Lower Arm, Position: Sitting, Cuff Size: Large Adult)    Pulse 70    Temp 98 ??F (36.7 ??C) (Temporal)    Resp 18    Wt 275 lb 6.4 oz (124.9 kg)    SpO2 98%    BMI 48.78 kg/m??     Body mass index is 48.78 kg/m??.  Wt Readings from Last 3 Encounters:   07/16/21 275 lb 6.4 oz (124.9 kg)   07/05/21 278 lb 1.6 oz (126.1 kg)   05/16/21 269 lb 6.4 oz (122.2 kg)     Physical Exam  Constitutional:  Appearance: She is obese.   HENT:      Head: Normocephalic and atraumatic.   Eyes:      Extraocular Movements: Extraocular movements intact.   Cardiovascular:      Rate and Rhythm:  Normal rate and regular rhythm.      Pulses: Normal pulses.      Heart sounds: Normal heart sounds.   Pulmonary:      Effort: Pulmonary effort is normal.      Breath sounds: Normal breath sounds.   Musculoskeletal:      Cervical back: Normal range of motion.   Skin:     Capillary Refill: Capillary refill takes less than 2 seconds.   Neurological:      Mental Status: She is alert and oriented to person, place, and time.   Psychiatric:         Mood and Affect: Mood normal.         Behavior: Behavior normal.       Radiology:   EKG:      Kathyrn Lass:   Darra was seen today for hypertension.    Diagnoses and all orders for this visit:    Chronic diastolic heart failure (HCC)  -     furosemide (LASIX) 40 MG tablet; Take 1 tablet by mouth daily    Fluid retention  -     furosemide (LASIX) 40 MG tablet; Take 1 tablet by mouth daily    Dyspnea on exertion  -     albuterol sulfate HFA (VENTOLIN HFA) 108 (90 Base) MCG/ACT inhaler; Inhale 2 puffs into the lungs 4 times daily as needed for Wheezing    Cervical pain (neck)  -     tiZANidine (ZANAFLEX) 2 MG tablet; Take 1 tablet by mouth nightly as needed (muscle spasms)    Lumbar back pain with radiculopathy affecting left lower extremity  -     tiZANidine (ZANAFLEX) 2 MG tablet; Take 1 tablet by mouth nightly as needed (muscle spasms)    Pain in right paraspinal region  -     tiZANidine (ZANAFLEX) 2 MG tablet; Take 1 tablet by mouth nightly as needed (muscle spasms)    Other orders  -     ondansetron (ZOFRAN-ODT) 4 MG disintegrating tablet; DISSOLVE 1 TABLET ON THE TONGUE EVERY 8 HOURS AS NEEDED FOR NAUSEA            Known risk factors for perioperative complications: none  {Preop assessment:15915    Pre-Operative Risk assessment using 2014 ACC/AHA guidelines     Emergent procedure No  Active Cardiac Condition No (decompensated HF, Arrhythmia, MI <3 weeks, severe valve disease)  Risk Level of Procedure Low Risk (endoscopy, superficial skin, breast, ambulatory, or cataract,  etc.)  Revised Cardiac Risk Index Risk factors: History of heart failure  Measurement of Exercise Tolerance before Surgery >4 No    According to the 2014 ACC/AHA pre-operative risk assessment guidelines Zuri A Raphael is a low risk for major cardiac complications during a low risk procedure and may continue as planned. Specific medication recommendations are listed below. Medications recommended to continue should be taken with a sip of water even when NPO.     Further recommendations from consultants: None    Medication Recommendations:      1. Preoperative workup as follows: CXR, EKG, CBC, BMP, LFT  2. Change in medicationregimen before surgery: none  3. Prophylaxis for cardiac events with perioperative beta-blockers:  none  4. Deep vein thrombosis prophylaxis:  none  Cleared for low-risk surgery     Nemiah Commander, MD, M.D.   07/16/2021, 2:24 PM

## 2021-07-16 NOTE — Patient Instructions (Signed)
You are cleared for your Cataract surgery.    Your medications have been sent to your pharmacy. If you have any issues, please do not hesitate to call us.    If you feel acutely or seriously ill, call 911 or go directly to the ED.    Please follow up with your cardiologist.

## 2021-07-19 MED FILL — FLUAD QUADRIVALENT 0.5 ML IM PRSY: 0.5 ML | INTRAMUSCULAR | Qty: 0.5

## 2021-07-27 NOTE — Telephone Encounter (Signed)
[facility] WANTS LAST OFFICE NOTES. [place] [phone] FAX [phone]

## 2021-07-30 NOTE — Telephone Encounter (Signed)
Returned call to [facility] unable to leave message faxed requested info 07/27/2021

## 2021-08-02 ENCOUNTER — Encounter

## 2021-08-02 MED ORDER — GABAPENTIN 300 MG PO CAPS
300 MG | ORAL_CAPSULE | Freq: Three times a day (TID) | ORAL | 2 refills | Status: DC
Start: 2021-08-02 — End: 2021-12-14

## 2021-08-02 MED ORDER — HYDROCHLOROTHIAZIDE 25 MG PO TABS
25 MG | ORAL_TABLET | ORAL | 3 refills | Status: DC
Start: 2021-08-02 — End: 2021-09-28

## 2021-08-02 NOTE — Progress Notes (Signed)
Patient is well-known to the clinic and last seen in office 07/16/21. Agree with the assessment that patient would benefit from continued home physical therapy.

## 2021-08-02 NOTE — Telephone Encounter (Signed)
Gabrielle Wells from [facility] called needing new  Home health referral for PT due to increased weakness in legs and range of motion. Also he stated that patient has increased weight of 10 lbs due to not taking water pill.

## 2021-08-02 NOTE — Telephone Encounter (Signed)
Requested Prescriptions     Pending Prescriptions Disp Refills    hydroCHLOROthiazide (HYDRODIURIL) 25 MG tablet [Pharmacy Med Name: HYDROCHLOROTHIAZIDE 25 MG TAB*] 45 tablet 3     Sig: TAKE ONE-HALF TABLET BY MOUTH DAILY            Last Office Visit: 03/14/2021     Next Office Visit: 09/14/2021         Last Labs: 09.05.2022

## 2021-08-15 ENCOUNTER — Ambulatory Visit
Admit: 2021-08-15 | Discharge: 2021-08-15 | Payer: PRIVATE HEALTH INSURANCE | Attending: Student in an Organized Health Care Education/Training Program | Primary: Student in an Organized Health Care Education/Training Program

## 2021-08-15 DIAGNOSIS — M5416 Radiculopathy, lumbar region: Secondary | ICD-10-CM

## 2021-08-15 MED ORDER — VITAMIN D3 25 MCG PO TABS
25 MCG | ORAL_TABLET | ORAL | 2 refills | Status: DC
Start: 2021-08-15 — End: 2021-12-18

## 2021-08-15 MED ORDER — DULOXETINE HCL 30 MG PO CPEP
30 MG | ORAL_CAPSULE | ORAL | 2 refills | Status: AC
Start: 2021-08-15 — End: 2021-12-18

## 2021-08-15 MED ORDER — VITAMIN D3 25 MCG PO TABS
25 MCG | ORAL_TABLET | ORAL | 2 refills | Status: DC
Start: 2021-08-15 — End: 2021-08-15

## 2021-08-15 NOTE — Patient Instructions (Addendum)
Please take your COREG, HYDRALAZINE, and AMLODIPINE the day of the surgery.  Please do not take DIURETICS the day of the surgery  Return to clinic in 1 month  Please take CYMBALTA for chronic back pain and IBS

## 2021-08-15 NOTE — Progress Notes (Addendum)
Preoperative Consultation      Gabrielle Wells  Date of Birth:  Dec 10, 1950    Date of Service:  08/15/2021    Cataract Surgery      This patient presents to the office today for a preoperative consultation at the request of surgeon, Dr. 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    s/i I&D and removaal of hardware    GERD (gastroesophageal reflux disease)     HTN (hypertension)     Hyperlipidemia     IBS (irritable bowel syndrome)     Incontinence of urine     Lumbar radiculopathy     Morbid obesity with BMI of 45.0-49.9, adult ([place])     Muscle fasciculation     Neuropathic pain     back 2/2 to spinal surgeries    SBO (small bowel obstruction) ([place]) 07/1999    with partial colectomy    Spinal fracture of T8 vertebra ([facility])     s/p fusion     Surgical site infection     back, spinal hardware     Urinary incontinence        Past Surgical History:   Procedure Laterality Date    ABDOMEN SURGERY N/A 05/16/2021    ABDOMINAL FAT PAD EXCISIONAL BIOPSY performed by Mikael Spray, MD at [facility]  2010    APPENDECTOMY      BREAST LUMPECTOMY Bilateral     benign lesions    CARDIAC CATHETERIZATION  2012    CHOLECYSTECTOMY      COLONOSCOPY      COLONOSCOPY  05/26/2020    COLONOSCOPY POLYPECTOMY SNARE/COLD BIOPSY performed by Alphonse Guild, MD at [facility]  05/26/2020    COLONOSCOPY WITH BIOPSY performed by Alphonse Guild, MD at [facility]  01/03/2021    CT BONE MARROW BIOPSY 01/03/2021 [facility] CT SCAN    HYSTERECTOMY, TOTAL ABDOMINAL (CERVIX REMOVED)      TOTAL KNEE ARTHROPLASTY Bilateral 2009    TOTAL SHOULDER ARTHROPLASTY W/ DISTAL CLAVICLE EXCISION Right 2010    UPPER  GASTROINTESTINAL ENDOSCOPY N/A 05/26/2020    EGD BIOPSY performed by Alphonse Guild, MD at [facility] ENDOSCOPY       Allergies   Allergen Reactions    Vancomycin Other (See Comments)     Drug induced NEUTROPENIA    Aspirin Other (See Comments)     Stomach bleeding    Ibuprofen      Stomach bleeding    Mushroom Extract Complex Hives and Itching    Shellfish Allergy Hives and Itching    Shellfish-Derived Products Hives and Itching    Sulfa Antibiotics Hives, Itching and Nausea And Vomiting    Sulfasalazine Hives and Itching    Acetaminophen Itching    Betadine [Povidone Iodine]     Ciprofloxacin Other (See Comments)     Made "bottom" raw    Coconut Oil     Codeine     Fish-Derived Products     Iodides     Iodine  Metronidazole Other (See Comments)     Made "bottom" raw    Other      detergents    Sulfacetamide     Amoxicillin-Pot Clavulanate Itching     Has never had problems with amoxicillin/ penicillin    HAS TOLERATED ZOSYN WITH ZERO PROBLEMS    Coconut Flavor Itching    Eggs Or Egg-Derived Products Nausea And Vomiting    Iodinated Contrast Media Hives     Takes Benadryl 75m PO before receiving iodinated contrast       No outpatient medications have been marked as taking for the 08/15/21 encounter (Office Visit) with JNemiah Commander MD.       Family History   Problem Relation Age of Onset    Cancer Paternal Grandmother         unk        Social History     Socioeconomic History    Marital status: Single     Spouse name: Not on file    Number of children: Not on file    Years of education: Not on file    Highest education level: Not on file   Occupational History    Not on file   Tobacco Use    Smoking status: Never    Smokeless tobacco: Never   Vaping Use    Vaping Use: Never used   Substance and Sexual Activity    Alcohol use: Never    Drug use: Never    Sexual activity: Not on file   Other Topics Concern    Not on file   Social History Narrative    Not on file     Social Determinants of Health     Financial  Resource Strain: Not on file   Food Insecurity: Not on file   Transportation Needs: Not on file   Physical Activity: Not on file   Stress: Not on file   Social Connections: Not on file   Intimate Partner Violence: Not on file   Housing Stability: Not on file       ROS:    Constitutional:  Denies fever or chills   Eyes:  Denies change in visual acuity   HENT:  Denies nasal congestion or sore throat   Respiratory:  Denies cough or shortness of breath   Cardiovascular:  Denies chest pain or edema   GI:  Denies abdominal pain, nausea, vomiting, bloody stools or diarrhea   GU:  Denies dysuria   Musculoskeletal:  Denies back pain or joint pain   Integument:  Denies rash   Neurologic:  Denies headache, focal weakness or sensory changes   Endocrine:  Denies polyuria or polydipsia   Lymphatic:  Denies swollen glands   Psychiatric:  Denies depression or anxiety       Physical Exam:    There were no vitals filed for this visit.   Wt Readings from Last 2 Encounters:   07/16/21 275 lb 6.4 oz (124.9 kg)   07/05/21 278 lb 1.6 oz (126.1 kg)     BP Readings from Last 3 Encounters:   07/16/21 131/77   07/05/21 (!) 184/113   05/16/21 (!) 141/76        Constitutional:  Well developed, well nourished, no acute distress, non-toxic appearance   Eyes:  PERRL, conjunctiva normal   HENT:  Atraumatic, external ears normal, nose normal, oropharynx moist, no pharyngeal exudates. Neck- normal range of motion, no tenderness, supple   Respiratory:  No respiratory distress, normal breath  sounds, no rales, no wheezing   Cardiovascular:  Normal rate, normal rhythm, no murmurs, no gallops, no rubs   GI:  Soft, nondistended, normal bowel sounds, nontender, no organomegaly, no mass, no rebound, no guarding   GU:  No costovertebral angle tenderness   Musculoskeletal:  No edema, no tenderness, no deformities. Back- no tenderness  Integument:  Well hydrated, no rash   Lymphatic:  No lymphadenopathy noted   Neurologic:  Alert & oriented x 3, CN 2-12 normal,  normal motor function, normal sensory function, no focal deficits noted   Psychiatric:  Speech and behavior appropriate          Assessment:       70 y.o. patient with planned surgery as above.    Known risk factors for perioperative complications:    Pre-Operative Risk assessment using 2014 ACC/AHA guidelines     Emergent procedure No  Active Cardiac Condition No (decompensated HF, Arrhythmia, MI <3 weeks, severe valve disease)  Risk Level of Procedure Low Risk (endoscopy, superficial skin, breast, ambulatory, or cataract, etc.)  Revised Cardiac Risk Index Risk factors: History of heart failure  Measurement of Exercise Tolerance before Surgery >4 No    According to the 2014 ACC/AHA pre-operative risk assessment guidelines Carely A Blecher is a low risk for major cardiac complications during a low risk procedure and may continue as planned. Specific medication recommendations are listed below. Medications recommended to continue should be taken with a sip of water even when NPO.     Further recommendations from consultants: None    Medication Recommendations:  Betablocker should be continued the day of surgery   Calcium Channel Blocker should be continued the day of surgery   Diuretics HOLD the morning dose on the day of surgery  Statins should be continued the day of surgery  ASA should be HELD 7 days prior to surgery and restarted with the postoperative diet     Plan:     1. Preoperative workup as follows: Physical exam, medication review  2. Change in medication regimen before surgery: None  3. Prophylaxis for cardiac events with perioperative beta-blockers:  None  4. Deep vein thrombosis prophylaxis:  Lovenox    Patient is medically cleared for procedure.    Addendum to Resident H& P/Progress note:  I have personally seen,examined and evaluated the patient. I have reviewed the current history, physical findings, labs and assessment and plan and agree with note as documented by resident MD ( Dr.Diangelo Radel)      Britt Bottom, MD, [facility]

## 2021-08-17 NOTE — Telephone Encounter (Signed)
P.A.T NURSE LIZ  FROM [facility] CALLED FOR PREOP H/P THAT WAS DONE ON 08-15-21, BUT IT NEED TO BE SIGNED BEFORE FAXING. [facility] [phone]. FAX [phone]. SURG 08-22-20.

## 2021-08-17 NOTE — Telephone Encounter (Signed)
H&P faxed to [facility]

## 2021-09-03 MED ORDER — ATORVASTATIN CALCIUM 40 MG PO TABS
40 MG | ORAL_TABLET | Freq: Every day | ORAL | 1 refills | Status: DC
Start: 2021-09-03 — End: 2021-12-18

## 2021-09-03 NOTE — Telephone Encounter (Signed)
Requested Prescriptions     Pending Prescriptions Disp Refills    atorvastatin (LIPITOR) 40 MG tablet 90 tablet 1     Sig: Take 1 tablet by mouth daily       Last Clinic Visit:  08/15/2021     Next Clinic Appointment:  09/13/2021

## 2021-09-03 NOTE — Telephone Encounter (Signed)
Please tell patient refill was approved

## 2021-09-13 ENCOUNTER — Ambulatory Visit
Payer: PRIVATE HEALTH INSURANCE | Attending: Student in an Organized Health Care Education/Training Program | Primary: Student in an Organized Health Care Education/Training Program

## 2021-09-14 ENCOUNTER — Encounter: Attending: Cardiovascular Disease | Primary: Internal Medicine

## 2021-09-25 ENCOUNTER — Ambulatory Visit
Admit: 2021-09-25 | Discharge: 2021-09-25 | Payer: PRIVATE HEALTH INSURANCE | Attending: Cardiovascular Disease | Primary: Student in an Organized Health Care Education/Training Program

## 2021-09-25 DIAGNOSIS — I10 Essential (primary) hypertension: Secondary | ICD-10-CM

## 2021-09-25 MED ORDER — CARVEDILOL 25 MG PO TABS
25 MG | ORAL_TABLET | Freq: Two times a day (BID) | ORAL | 3 refills | Status: AC
Start: 2021-09-25 — End: 2022-01-09

## 2021-09-25 NOTE — Progress Notes (Signed)
Cc: Resistant HTN, HFpEF     HPI:     Gabrielle Wells is a 71 y.o. female with a past medical history of morbid obesity, resistant HTN, HFpEF, CKD, GERD, MGUS.       Patient was [facility] at [facility] 2/22 - 10/15/19 for dehydration, AKI, sinus bradycardia (due to BB toxicity in the setting of AKI).      Echo 09/2019: nl LV, EF 70-62%, normal diastolic, normal valves and RV.     ECG 10/11/19: sinus brady 55, nonspecific changes, low voltage.     Renal duplex 09/2019: no RAS, indeterminate hypoechoic lesion R kidney.     Urine metanephrines w/ high norepi, normal cortisol, renin and aldosterone.      FLP 07/2019: TC 224, HDL 62, LDL 137, TG 124 off statin.     Lexi 10/2020: normal.      Patient is here for a follow up. She reports SBP 180-200s at home.  She denies any symptoms.  She avoids salt.  She uses a walker for ambulation.      Histories     Past Medical History:   has a past medical history of Arthritis, Asthma, CHF (congestive heart failure) ([place]), Epidural abscess, GERD (gastroesophageal reflux disease), HTN (hypertension), Hyperlipidemia, IBS (irritable bowel syndrome), Incontinence of urine, Lumbar radiculopathy, Morbid obesity with BMI of 45.0-49.9, adult ([place]), Muscle fasciculation, Neuropathic pain, SBO (small bowel obstruction) ([place]), Spinal fracture of T8 vertebra ([place]), Surgical site infection, and Urinary incontinence.    Surgical History:   has a past surgical history that includes Appendectomy; Breast lumpectomy (Bilateral); Hysterectomy, total abdominal; Cholecystectomy; Total knee arthroplasty (Bilateral, 2009); Cardiac catheterization (2012); Total shoulder arthroplasty w/ distal clavicle excision (Right, 2010); Abdominal hernia repair (2010); Colonoscopy; Upper gastrointestinal endoscopy (N/A, 05/26/2020); Colonoscopy (05/26/2020); Colonoscopy (05/26/2020); CT BIOPSY BONE MARROW (01/03/2021); and Abdomen surgery (N/A, 05/16/2021).     Social History:   reports that she has never smoked. She has never  used smokeless tobacco. She reports that she does not drink alcohol and does not use drugs.     Family History:  No evidence for sudden cardiac death or premature CAD      Medications:     Home medications were reviewed and are listed below    Prior to Admission medications    Medication Sig Start Date End Date Taking? Authorizing Provider   carvedilol (COREG) 25 MG tablet Take 2 tablets by mouth 2 times daily 09/25/21  Yes Hervey Ard, MD   atorvastatin (LIPITOR) 40 MG tablet Take 1 tablet by mouth daily 09/03/21  Yes Minna Antis, MD   Cholecalciferol (VITAMIN D3) 25 MCG TABS TAKE TWO TABLETS BY MOUTH DAILY 08/15/21  Yes Elmyra Ricks, MD   gabapentin (NEURONTIN) 300 MG capsule Take 1 capsule by mouth 3 times daily for 90 days. 08/02/21 10/31/21 Yes Adrian Saran, MD   hydroCHLOROthiazide (HYDRODIURIL) 25 MG tablet TAKE ONE-HALF TABLET BY MOUTH DAILY 08/02/21  Yes Hervey Ard, MD   furosemide (LASIX) 40 MG tablet Take 1 tablet by mouth daily 07/16/21 09/25/21 Yes Nemiah Commander, MD   albuterol sulfate HFA (VENTOLIN HFA) 108 (90 Base) MCG/ACT inhaler Inhale 2 puffs into the lungs 4 times daily as needed for Wheezing 07/16/21  Yes Nemiah Commander, MD   tiZANidine (ZANAFLEX) 2 MG tablet Take 1 tablet by mouth nightly as needed (muscle spasms) 07/16/21  Yes Nemiah Commander, MD   ondansetron (ZOFRAN-ODT) 4 MG disintegrating tablet DISSOLVE 1 TABLET ON THE TONGUE EVERY 8 HOURS AS  NEEDED FOR NAUSEA 07/16/21  Yes Nemiah Commander, MD   Handicap Placard MISC by Does not apply route 07/05/2021-07/05/2024 07/05/21  Yes Jason Fila, MD   hydrALAZINE (APRESOLINE) 50 MG tablet Take 1 tablet by mouth 3 times daily 12/07/20  Yes Arshdeep Tindni, MD   omeprazole (PRILOSEC) 20 MG delayed release capsule Take 1 capsule by mouth every morning (before breakfast) 11/28/20  Yes Jhonnie Garner, MD   amLODIPine (NORVASC) 10 MG tablet Take 1 tablet by mouth daily 11/16/20  Yes Arshdeep Tindni, MD   acetaminophen (TYLENOL) 500 MG tablet Take 500 mg by  mouth every 6 hours as needed for Pain    Yes Historical Provider, MD   DULoxetine (CYMBALTA) 30 MG extended release capsule TAKE ONE CAPSULE BY MOUTH DAILY  Patient not taking: No sig reported 08/15/21   Elmyra Ricks, MD   Cholecalciferol (VITAMIN D3) 25 MCG TABS TAKE TWO TABLETS BY MOUTH DAILY 08/15/21   Elmyra Ricks, MD   dicyclomine (BENTYL) 10 MG capsule TAKE ONE CAPSULE BY MOUTH FOUR TIMES DAILY  Patient not taking: No sig reported 07/16/21   Nemiah Commander, MD          Allergy:     Vancomycin, Aspirin, Ibuprofen, Mushroom extract complex, Shellfish allergy, Shellfish-derived products, Sulfa antibiotics, Sulfasalazine, Acetaminophen, Betadine [povidone iodine], Ciprofloxacin, Coconut oil, Codeine, Fish-derived products, Iodides, Iodine, Metronidazole, Other, Sulfacetamide, Amoxicillin-pot clavulanate, Coconut flavor, Eggs or egg-derived products, and Iodinated contrast media       Review of Systems:     All 12 point review of symptoms completed. Pertinent positives identified in the HPI, all other review of symptoms negative as below.    CONSTITUTIONAL: No fatigue  SKIN: No rash or pruritis.  EYES: No visual changes or diplopia. No scleral icterus.  ENT: No Headaches, hearing loss or vertigo. No mouth sores or sore throat.  CARDIOVASCULAR: No chest pain/chest pressure/chest discomfort. No palpitations. No edema.   RESPIRATORY: No dyspnea. No cough or wheezing, no sputum production.  GASTROINTESTINAL: No N/V/D. No abdominal pain, appetite loss, blood in stools.  GENITOURINARY: No dysuria, trouble voiding, or hematuria.  MUSCULOSKELETAL:  No gait disturbance, weakness or joint complaints.  NEUROLOGICAL: No headache, diplopia, change in muscle strength, numbness or tingling. No change in gait, balance, coordination, mood, affect, memory, mentation, behavior.  PSHYCH: No anxiety, loss of interest, change in sexual behavior, feelings of self-harm, or confusion.  ENDOCRINE: No excessive thirst, fluid intake, or  urination. No tremor.  HEMATOLOGIC: No abnormal bruising or bleeding.  ALLERGY: No nasal congestion or hives.      Physical Examination:     Vitals:    09/25/21 1610   BP: 130/80   Pulse: 80   Weight: 281 lb 6.4 oz (127.6 kg)       Wt Readings from Last 3 Encounters:   09/25/21 281 lb 6.4 oz (127.6 kg)   08/15/21 278 lb 14.4 oz (126.5 kg)   07/16/21 275 lb 6.4 oz (124.9 kg)         General Appearance:  Alert, cooperative, no distress, appears stated age Appropriate weight   Head:  Normocephalic, without obvious abnormality, atraumatic   Eyes:  PERRL, conjunctiva/corneas clear EOM intact  Ears normal   Throat no lesions       Nose: Nares normal, no drainage or sinus tenderness   Throat: Lips, mucosa, and tongue normal   Neck: Supple, symmetrical, trachea midline, no adenopathy, thyroid: not enlarged, symmetric, no tenderness/mass/nodules, no carotid bruit  Lungs:   Clear to auscultation bilaterally, respirations unlabored   Chest Wall:  No tenderness or deformity   Heart:  Regular rhythm, rate is controlled, S1, S2 normal, there is no murmur, there is no rub or gallop, cannot assess jvd, no bilateral lower extremity edema   Abdomen:   Soft, non-tender, bowel sounds active all four quadrants,  no masses, no organomegaly       Extremities: Extremities normal, atraumatic, no cyanosis   Pulses: 2+ and symmetric   Skin: Skin color, texture, turgor normal, no rashes or lesions   Pysch: Normal mood and affect   Neurologic: Normal gross motor and sensory exam.  Cranial nerves intact        Labs:     Lab Results   Component Value Date    WBC 8.1 04/23/2021    HGB 13.0 04/23/2021    HCT 40.3 04/23/2021    MCV 85.9 04/23/2021    PLT 113 (L) 04/23/2021     Lab Results   Component Value Date    NA 141 04/23/2021    K 4.2 04/23/2021    CL 105 04/23/2021    CO2 26 04/23/2021    BUN 9 04/23/2021    CREATININE 1.0 04/23/2021    GLUCOSE 125 (H) 04/23/2021    CALCIUM 9.3 04/23/2021    PROT 7.0 04/23/2021    LABALBU 4.1 04/23/2021     BILITOT <0.2 04/23/2021    ALKPHOS 78 04/23/2021    AST 16 04/23/2021    ALT 7 (L) 04/23/2021    LABGLOM 54 (A) 04/23/2021    GFRAA >60 04/23/2021    AGRATIO 1.0 (L) 05/08/2020    GLOB 4.2 05/08/2020         Lab Results   Component Value Date    CHOL 224 (H) 08/04/2019     Lab Results   Component Value Date    TRIG 124 08/04/2019     Lab Results   Component Value Date    HDL 62 (H) 08/04/2019     Lab Results   Component Value Date    LDLCALC 137 (H) 08/04/2019     Lab Results   Component Value Date    LABVLDL 25 08/04/2019     No results found for: [facility]    Lab Results   Component Value Date    INR 0.95 01/03/2021    INR 1.03 10/11/2019    PROTIME 10.7 01/03/2021    PROTIME 12.0 10/11/2019       The 10-year ASCVD risk score (Arnett DK, et al., 2019) is: 15.4%    Values used to calculate the score:      Age: 1 years      Sex: Female      Is Non-Hispanic African American: Yes      Diabetic: No      Tobacco smoker: No      Systolic Blood Pressure: 789 mmHg      Is BP treated: Yes      HDL Cholesterol: 62 mg/dL      Total Cholesterol: 224 mg/dL      Assessment / Plan:      Diagnosis Orders   1. Severe hypertension             1. Resistant HTN:  Patient reports compliance with meds and diet. It could be secondary HTN (elevated norepinephrine per urine test). She follows with Dr Silvestre Moment, nephrologist. BP is uncontrolled at home.      -Will defer  to PCP vs endocrinologist further investigation regarding abnormal norepi levels.   -Increase coreg 50 bid  -Off ARB due to CKD  -Cw hydralazine 50 mg tid (may need to increase to 75 mg tid if needed).  -Cw amlodipine 10 mg and HCTZ 12.5 daily.   -Low salt diet, she takes Lasix 40 mg p.o. daily as needed edema.     2. Chronic HFpEF:   Patient appears euvolemic.      -BP control as above.  -Low salt diet  -On lasix prn     3. Exertional dypnea and fatigue:  Lexi nuc stress test excluded ischemia.     -Conservative approach     4. HLP:   Patient was started on lipitor by  PCP.           We will schedule a follow up visit in 3 months      I have spent 42 minutes of face to face time with the patient with more than 50% spent counseling and coordinating care.       I have personally reviewed the reports and images of labs, radiological studies, cardiac studies including ECG's and telemetry, current and old medical records. The note was completed using EMR and Dragon dictation system. Every effort was made to ensure accuracy; however, inadvertent computerized transcription errors may be present.    All questions and concerns were addressed to the patient/family. Alternatives to my treatment were discussed.     I would like to thank you for providing me the opportunity to participate in the care of your patient. If you have any questions, please do not hesitate to contact me.     Hervey Ard, MD, [facility], [place]  [address]  [place] [address]  Main Office Phone: [phone]  Fax: [phone]

## 2021-09-27 ENCOUNTER — Encounter
Payer: PRIVATE HEALTH INSURANCE | Attending: Nephrology | Primary: Student in an Organized Health Care Education/Training Program

## 2021-09-28 ENCOUNTER — Ambulatory Visit
Admit: 2021-09-28 | Discharge: 2021-09-28 | Payer: PRIVATE HEALTH INSURANCE | Attending: Nephrology | Primary: Student in an Organized Health Care Education/Training Program

## 2021-09-28 ENCOUNTER — Encounter

## 2021-09-28 DIAGNOSIS — I5032 Chronic diastolic (congestive) heart failure: Secondary | ICD-10-CM

## 2021-09-28 MED ORDER — FUROSEMIDE 20 MG PO TABS
20 MG | ORAL_TABLET | Freq: Every day | ORAL | 5 refills | Status: AC
Start: 2021-09-28 — End: 2021-10-28

## 2021-09-28 MED ORDER — LOSARTAN POTASSIUM 100 MG PO TABS
100 MG | ORAL_TABLET | Freq: Every day | ORAL | 5 refills | Status: DC
Start: 2021-09-28 — End: 2021-11-08

## 2021-09-28 NOTE — Progress Notes (Signed)
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 female who presents to the office for evaluation of  AKI on CKD 3   Pt has CHF was on diuretics   Off now for a week   BNP 200    BP low   On high dose Neurontin           Pt denies any hx of heavy or prolonged NSAID use.   There is no hx of jaundice or hepatitis or sexually transmitted disease.   Pt has no hx of collagen vascular disease or vasculitis.  No history of dysuria or frequency.No recent procedures involving IV contrast.   There is no hx of paraprotein disease.   Pt denies any hx of recurrent UTI , incontinence or nocturia or recurrent nephrolithiasis. No unusual skin rashes .   No tea coloured urine .   Medication reviewed and noted the use of ace/arb, diuretic.    Past Medical History:        Diagnosis Date    Arthritis     Asthma     CHF (congestive heart failure) ([place])     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    s/i I&D and removaal of hardware    GERD (gastroesophageal reflux disease)     HTN (hypertension)     Hyperlipidemia     IBS (irritable bowel syndrome)     Incontinence of urine     Lumbar radiculopathy     Morbid obesity with BMI of 45.0-49.9, adult (HCC)     Muscle fasciculation     Neuropathic pain     back 2/2 to spinal surgeries    SBO (small bowel obstruction) ([place]) 07/1999    with partial colectomy    Spinal fracture of T8 vertebra ([place])     s/p fusion     Surgical site infection      back, spinal hardware     Urinary incontinence        Past Surgical History:        Procedure Laterality Date    ABDOMEN SURGERY N/A 05/16/2021    ABDOMINAL FAT PAD EXCISIONAL BIOPSY performed by Mikael Spray, MD at [place]  2010    APPENDECTOMY      BREAST LUMPECTOMY Bilateral     benign lesions    CARDIAC CATHETERIZATION  2012    CHOLECYSTECTOMY      COLONOSCOPY      COLONOSCOPY  05/26/2020    COLONOSCOPY POLYPECTOMY SNARE/COLD BIOPSY performed by Alphonse Guild, MD at [facility]  ENDOSCOPY    COLONOSCOPY  05/26/2020    COLONOSCOPY WITH BIOPSY performed by Alphonse Guild, MD at [place]  01/03/2021    CT BONE MARROW BIOPSY 01/03/2021 [place] CT SCAN    HYSTERECTOMY, TOTAL ABDOMINAL (CERVIX REMOVED)      TOTAL KNEE ARTHROPLASTY Bilateral 2009    TOTAL SHOULDER ARTHROPLASTY W/ DISTAL CLAVICLE EXCISION Right 2010    UPPER GASTROINTESTINAL ENDOSCOPY N/A 05/26/2020    EGD BIOPSY performed by Alphonse Guild, MD at [facility] ENDOSCOPY       Current Medications:    Current Outpatient Medications   Medication Sig Dispense Refill    carvedilol (COREG) 25 MG tablet Take 2 tablets by mouth 2 times daily 360 tablet 3    atorvastatin (LIPITOR) 40 MG tablet Take 1 tablet by mouth daily 90 tablet 1    DULoxetine (CYMBALTA) 30 MG extended release capsule TAKE ONE CAPSULE BY MOUTH DAILY 30 capsule 2    Cholecalciferol (VITAMIN D3) 25 MCG TABS TAKE TWO TABLETS BY MOUTH DAILY 60 tablet 2    gabapentin (NEURONTIN) 300 MG capsule Take 1 capsule by mouth 3 times daily for 90 days. 90 capsule 2    hydroCHLOROthiazide (HYDRODIURIL) 25 MG tablet TAKE ONE-HALF TABLET BY MOUTH DAILY 45 tablet 3    albuterol sulfate HFA (VENTOLIN HFA) 108 (90 Base) MCG/ACT inhaler Inhale 2 puffs into the lungs 4 times daily as needed for Wheezing 18 g 0    tiZANidine (ZANAFLEX) 2 MG tablet Take 1 tablet by mouth nightly as needed (muscle spasms) 30 tablet 1    ondansetron (ZOFRAN-ODT) 4 MG disintegrating tablet  DISSOLVE 1 TABLET ON THE TONGUE EVERY 8 HOURS AS NEEDED FOR NAUSEA 21 tablet 0    dicyclomine (BENTYL) 10 MG capsule TAKE ONE CAPSULE BY MOUTH FOUR TIMES DAILY 120 capsule 0    Handicap Placard MISC by Does not apply route 07/05/2021-07/05/2024 1 each 0    hydrALAZINE (APRESOLINE) 50 MG tablet Take 1 tablet by mouth 3 times daily 90 tablet 5    omeprazole (PRILOSEC) 20 MG delayed release capsule Take 1 capsule by mouth every morning (before breakfast) 90 capsule 1    amLODIPine (NORVASC) 10 MG tablet Take 1 tablet by mouth daily 30 tablet 5    acetaminophen (TYLENOL) 500 MG tablet Take 500 mg by mouth every 6 hours as needed for Pain       furosemide (LASIX) 40 MG tablet Take 1 tablet by mouth daily 30 tablet 0     No current facility-administered medications for this visit.       Allergies:  Vancomycin, Aspirin, Ibuprofen, Mushroom extract complex, Shellfish allergy, Shellfish-derived products, Sulfa antibiotics, Sulfasalazine, Acetaminophen, Betadine [povidone iodine], Ciprofloxacin, Coconut oil, Codeine, Fish-derived products, Iodides, Iodine, Metronidazole, Other, Sulfacetamide, Amoxicillin-pot clavulanate, Coconut flavor, Eggs or egg-derived products, and Iodinated contrast media    Social History:   Social History     Socioeconomic History    Marital status: Single     Spouse name: Not on file    Number of children: Not on file    Years of education: Not on file    Highest education level: Not on file   Occupational History    Not on file   Tobacco Use    Smoking status: Never    Smokeless tobacco: Never   Vaping Use    Vaping Use: Never used   Substance and Sexual Activity    Alcohol use: Never  Drug use: Never    Sexual activity: Not on file   Other Topics Concern    Not on file   Social History Narrative    Not on file     Social Determinants of Health     Financial Resource Strain: Not on file   Food Insecurity: Not on file   Transportation Needs: Not on file   Physical Activity: Not on file   Stress:  Not on file   Social Connections: Not on file   Intimate Partner Violence: Not on file   Housing Stability: Not on file       Family History:   Family History   Problem Relation Age of Onset    Cancer Paternal Grandmother         unk        Review of Systems:    Constitutional: No fever, no chills, no lethargy, no weakness.  HEENT:  No headache, otalgia, itchy eyes, nasal discharge or sore throat.  Cardiac:  No chest pain, dyspnea, orthopnea or PND.  Chest:  No cough, phlegm or wheezing.  Abdomen:  No abdominal pain, nausea or vomiting.  Neuro:  No focal weakness, abnormal movements orseizure like activity.  Skin:   No rashes, no itching.  GU:   No hematuria, no pyuria, no dysuria, no flank pain.  Extremities:  No swelling or joint pains.      Objective:  BP (!) 151/86    Pulse 77     Physical Exam:  General appearance:Awake, alert, in no acute distress  Skin: warm and dry, no rash or erythema  Eyes: conjunctivae normal and sclera anicteric  ENT: :no thrush no pharyngeal congestion    Neck: without any JVD. No carotid bruits or thyromegaly. No  Lymphadenopathty:  Pulmonary: lungs are clear and without any wheezing or rhonchi   Cardiovascular:Normal S1 & S2, No S3 or  S4, No  Pericardial rub , No Murmur Abdomen: soft nontender, bowel sounds present, no organomegaly,  No ascites  Extremities: no cyanosis, clubbing or edema    Labs:    CBC:   Lab Results   Component Value Date/Time    WBC 8.1 04/23/2021 10:46 PM    RBC 4.69 04/23/2021 10:46 PM    HGB 13.0 04/23/2021 10:46 PM    HCT 40.3 04/23/2021 10:46 PM    MCV 85.9 04/23/2021 10:46 PM    RDW 14.2 04/23/2021 10:46 PM    PLT 113 04/23/2021 10:46 PM    MPV 9.7 04/23/2021 10:46 PM      BMP:   Lab Results   Component Value Date/Time    NA 141 04/23/2021 10:46 PM    NA 140 06/12/2020 11:47 AM    NA 139 06/08/2020 04:19 PM    K 4.2 04/23/2021 10:46 PM    K 4.9 06/12/2020 11:47 AM    K 4.0 06/08/2020 04:19 PM    K 6.0 05/08/2020 05:20 PM    CL 105 04/23/2021 10:46 PM    CL  103 06/12/2020 11:47 AM    CL 102 06/08/2020 04:19 PM    CO2 26 04/23/2021 10:46 PM    CO2 25 06/12/2020 11:47 AM    CO2 23 06/08/2020 04:19 PM    BUN 9 04/23/2021 10:46 PM    BUN 8 06/12/2020 11:47 AM    BUN 9 06/08/2020 04:19 PM    CREATININE 1.0 04/23/2021 10:46 PM    CREATININE 0.9 06/12/2020 11:47 AM    CREATININE 1.1 06/08/2020 04:19 PM  GLUCOSE 125 04/23/2021 10:46 PM    GLUCOSE 106 06/12/2020 11:47 AM    GLUCOSE 115 06/08/2020 04:19 PM    CALCIUM 9.3 04/23/2021 10:46 PM    CALCIUM 8.8 06/12/2020 11:47 AM    CALCIUM 9.1 06/08/2020 04:19 PM      BNP:No results found for: BNP  PHOSPHORUS:    Lab Results   Component Value Date/Time    PHOS 3.3 04/18/2020 02:00 PM    PHOS 2.9 03/07/2020 03:37 PM    PHOS 3.0 10/12/2019 05:22 PM     MAGNESIUM: No results found for: MG  ALBUMIN:   Lab Results   Component Value Date/Time    LABALBU 4.1 04/23/2021 10:46 PM     IRON:  No results found for: IRON  IRON SATURATION:  No results found for: LABIRON  TIBC:  No results found for: TIBC  FERRITIN:  No results found for: FERRITIN  ANA: No results found for: ANA    SPEP:   Lab Results   Component Value Date/Time    PROT 7.0 04/23/2021 10:46 PM     UPEP: No results found for: TPU   HEPBSAG:No results found for: HEPBSAG  HEPCAB:No results found for: HEPCAB  C3: No results found for: C3  C4: No results found for: C4  MPO ANCA: No results found for: MPO .  PR3 ANCA:  No results found for: PR3  PTH: No results found for: PTH    Urine Creatinine:    Lab Results   Component Value Date/Time    LABCREA 446.6 03/07/2020 03:48 PM     Urine Eosinophils: No results found for: UREO  Urine Protein:  No results found for: TPU  Urinalysis:  U/A:   Lab Results   Component Value Date/Time    NITRU Negative 04/23/2021 11:36 PM    COLORU Yellow 04/23/2021 11:36 PM    PHUR 7.5 04/23/2021 11:36 PM    WBCUA 0-2 04/23/2021 11:36 PM    RBCUA 0-2 04/23/2021 11:36 PM    MUCUS 1+ 04/23/2021 11:36 PM    BACTERIA 1+ 04/23/2021 11:36 PM    CLARITYU Clear  04/23/2021 11:36 PM    SPECGRAV 1.020 04/23/2021 11:36 PM    LEUKOCYTESUR TRACE 04/23/2021 11:36 PM    UROBILINOGEN 0.2 04/23/2021 11:36 PM    BILIRUBINUR Negative 04/23/2021 11:36 PM    BLOODU Negative 04/23/2021 11:36 PM    GLUCOSEU Negative 04/23/2021 11:36 PM    KETUA Negative 04/23/2021 11:36 PM    AMORPHOUS Rare 03/07/2020 03:49 PM     Wt Readings from Last 3 Encounters:   09/25/21 281 lb 6.4 oz (127.6 kg)   08/15/21 278 lb 14.4 oz (126.5 kg)   07/16/21 275 lb 6.4 oz (124.9 kg)       Radiology:  Reviewed as available.    Assessment        Plan:    AKI   CHF   HTN   Neuropathy   Vit D def   IBS  MGUS       Plan  - GFR stable  - BP controlled   - Pt had bone marrow biopsy and was diagnosed with MGUS   - AKI sec to vol changes   - EF nl   - BNP 202   - on lasix  - stop thiazide  - stop CCB as that cause edema  - add ARB   - dec Neurontin   - GFR - 20--->60--->54     - CHF stable       -  Avoid any NSAIDS       Thank you for the consultation.  Please do not hesitate to call with questions.

## 2021-10-02 ENCOUNTER — Ambulatory Visit: Payer: PRIVATE HEALTH INSURANCE | Primary: Student in an Organized Health Care Education/Training Program

## 2021-10-02 NOTE — Progress Notes (Deleted)
[facility]    Gabrielle Wells is a 71 y.o. 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 Authorizing Provider   losartan (COZAAR) 100 MG tablet Take 1 tablet by mouth daily  Arshdeep Tindni, MD   furosemide (LASIX) 20 MG tablet Take 1 tablet by mouth daily  Arshdeep Tindni, MD   carvedilol (COREG) 25 MG tablet Take 2 tablets by mouth 2 times daily  Hervey Ard, MD   atorvastatin (LIPITOR) 40 MG tablet Take 1 tablet by mouth daily  Minna Antis, MD   DULoxetine (CYMBALTA) 30 MG extended release capsule TAKE ONE CAPSULE BY MOUTH DAILY  Elmyra Ricks, MD   Cholecalciferol (VITAMIN D3) 25 MCG TABS TAKE TWO TABLETS BY MOUTH DAILY  Elmyra Ricks, MD   Cholecalciferol (VITAMIN D3) 25 MCG TABS TAKE TWO TABLETS BY MOUTH DAILY  Elmyra Ricks, MD   gabapentin (NEURONTIN) 300 MG capsule Take 1 capsule by mouth 3 times daily for 90 days.  Adrian Saran, MD   albuterol sulfate HFA (VENTOLIN HFA) 108 (90 Base) MCG/ACT inhaler Inhale 2 puffs into the lungs 4 times daily as needed for Wheezing  Nemiah Commander, MD   tiZANidine (ZANAFLEX) 2 MG tablet Take 1 tablet by mouth nightly as needed (muscle spasms)  Nemiah Commander, MD   ondansetron (ZOFRAN-ODT) 4 MG disintegrating tablet DISSOLVE 1 TABLET ON THE TONGUE EVERY 8 HOURS AS NEEDED FOR NAUSEA  Nemiah Commander, MD   dicyclomine (BENTYL) 10 MG capsule TAKE ONE CAPSULE BY MOUTH FOUR TIMES DAILY  Nemiah Commander, MD   Handicap Placard MISC by Does not apply route 07/05/2021-07/05/2024  Jason Fila, MD   hydrALAZINE (APRESOLINE) 50 MG tablet Take 1 tablet by mouth 3 times daily  Arshdeep Tindni, MD   omeprazole (PRILOSEC) 20 MG delayed release capsule Take 1 capsule by mouth every morning (before breakfast)   Jhonnie Garner, MD   acetaminophen (TYLENOL) 500 MG tablet Take 500 mg by mouth every 6 hours as needed for Pain   Historical Provider, MD        There were no vitals filed for this visit.   Estimated body mass index is 49.85 kg/m?? as calculated from the following:    Height as of 08/15/21: 5\' 3"  (1.6 m).    Weight as of 09/25/21: 281 lb 6.4 oz (127.6 kg).  Physical Exam    ASSESSMENT/PLAN:     {There are no diagnoses linked to this encounter. (Refresh or delete this SmartLink)}    No follow-ups on file.    The patient was staffed with the {teaching attending:[id number]:::1}.    An electronic signature was used to authenticate this note.    --Susie Cassette, MD

## 2021-10-04 ENCOUNTER — Inpatient Hospital Stay
Admit: 2021-10-04 | Discharge: 2021-10-05 | Disposition: A | Discharge status: Home / Self Care | Payer: PRIVATE HEALTH INSURANCE

## 2021-10-04 DIAGNOSIS — U071 COVID-19: Secondary | ICD-10-CM

## 2021-10-04 MED ORDER — nirmatrelvir 150 mg - ritonavir 100 mg (PAXLOVID) 300 mg (150 mg x 2)-100 mg DsPk
300 | PACK | Freq: Two times a day (BID) | ORAL | 0 refills | 5.00000 days | Status: AC
Start: 2021-10-04 — End: 2021-12-12

## 2021-10-04 MED ORDER — acetaminophen (TYLENOL) tablet 975 mg
325 | Freq: Once | ORAL | Status: AC
Start: 2021-10-04 — End: 2021-10-04
  Administered 2021-10-05: 03:00:00 975 mg via ORAL

## 2021-10-04 MED FILL — TYLENOL 325 MG TABLET: 325 325 mg | ORAL | Qty: 3

## 2021-10-04 NOTE — Unmapped (Signed)
You were seen in the emergency department for fatigue and a positive Covid test. Your labs, x-ray and exam were reassuring.    Please take the Paxlovid antiviral we have prescribed for the full 5 days. Please hold your atorvastatin during this time as these two medications can have a bad interaction together.    Call 911 or go to the nearest Emergency Department immediately for worsening symptoms, difficulty breathing, chest pain, lightheadedness, difficulty moving or talking, sensory loss, difficulty controlling your bowel or bladder function, altered level of consciousness, neck stiffness, uncontrollable nausea or vomiting, uncontrollable pain, inability to tolerate oral intake, fever, chills, severe bleeding, signs of infection (spreading redness, area feels very warm, swelling, pain, foul-smelling drainage), suicidal or homicidal ideation, or any other concerns.    If we have prescribed you any new medications, please take them as prescribed and read the drug package insert carefully.  Do not drink alcohol, drive, or operate machinery if you are taking narcotic pain medications.    Your condition requires follow-up with your primary care provider within 5 days, please see above for details. Bring these discharge instructions with you when you see your doctor. Avoid all strenuous activity until you have checked with your primary care provider.

## 2021-10-04 NOTE — Unmapped (Signed)
This patient was seen by the advanced practice provider. I have seen and examined the patient, agree with the workup, evaluation, management and diagnosis. Care plan has been discussed.      I have reviewed the ECG and concur.       Assessment reveals 71 year old female with history of hypertension, hyperlipidemia, CHF, CKD presenting for positive COVID test at home.  She has diffuse myalgias and fatigue.  No respiratory distress.  Lungs clear to auscultation.  She has reassuring laboratory studies here.  Chest x-ray is clear.  She typically ambulates with a walker and is able to ambulate here with assistance to simulate this and does not desaturate.  We will prescribe Paxlovid given her comorbidities.  Strict return precautions provided.  Discharged in stable condition with written and verbal instructions for which to return to the ED.

## 2021-10-04 NOTE — Unmapped (Addendum)
[facility]  MEDICAL SCREENING EXAM    Date of Service: 10/04/2021    Reason for Visit: Fatigue      MSE Plan     Patient evaluated from the lobby for a medical screening exam.  In short, this is a patient presenting with shortness of breath, cough, fatigue, diarrhea ongoing for the past 2 days.  Her son tested positive for COVID yesterday and she tested positive for COVID today.  She denies chest pain, urinary symptoms, abdominal pain. To further evaluate their complaints, the following orders have been placed:     Labs Reviewed   BASIC METABOLIC PANEL   CBC   DIFFERENTIAL   VENOUS BLOOD GAS, LINE/SYRINGE   ED ECG 12-LEAD (MUSE)     No orders to display     No results found for this or any previous visit (from the past 4160 hour(s)).    Stable for lobby while awaiting ED bed. See primary provider's note for full details and final disposition.      Brief HPI     Veronica Winters is a 70 y.o. female evaluated from the lobby for a medical screening exam.  In short, this is a patient presenting with shortness of breath, cough, fatigue, diarrhea ongoing for the past 2 days.  Her son tested positive for COVID yesterday and she tested positive for COVID today.  She denies chest pain, urinary symptoms, abdominal pain.   Pertinent Physical Exam     ED Triage Vitals [10/04/21 1431]   Enc Vitals Group      BP (!) 159/92      Heart Rate 78      Resp 20      Temp 98.7 ??F (37.1 ??C)      Temp Source Oral      SpO2 99 %      Weight       Height       Head Circumference       Peak Flow       Pain Score       Pain Loc       Pain Edu?       Excl. in GC?      Physical Exam  General:  Nontoxic     HEENT:   Normocephalic, atraumatic.     Neck:  Supple, no JVD; trachea midline       Pulmonary:  Normal effort. No distress.      Cardiac:  Regular rate and rhythm; without murmurs, rubs, gallops.     Abdomen: Nondistended     Musculoskeletal:  MAE.     Skin:  Warm, dry. No cyanosis or pallor. No rash.      Neuro:  A&O  x4.  No gross neurologic deficits     Psych:  Normal mood and affect for clinical situation.       Patient History     Medical History:   Past Medical History:   Diagnosis Date   ??? Asthma    ??? CHF (congestive heart failure) (CMS-HCC)    ??? Hypercholesteremia    ??? Hypertension    ??? Renal disorder      Surgical History: No past surgical history on file.   Medications: No current facility-administered medications for this encounter.    Current Outpatient Medications:   ???  albuterol (PROVENTIL) 2.5 mg /3 mL (0.083 %) nebulizer solution, Inhale into the lungs., Disp: , Rfl:   ???  amLODIPine (NORVASC)  5 MG tablet, Take 5 mg by mouth daily., Disp: , Rfl:   ???  atorvastatin (LIPITOR) 40 MG tablet, Take 1 tablet by mouth daily., Disp: , Rfl:   ???  buPROPion SR tablet 100mg , 12 HR Sustained-Release, Take by mouth., Disp: , Rfl:   ???  carvediloL (COREG) 25 MG tablet, Take 1 tablet by mouth 2 times a day., Disp: , Rfl:   ???  cefUROXime (CEFTIN) 500 MG tablet, Take 500 mg by mouth., Disp: , Rfl:   ???  cholecalciferol, vitamin D3, 1000 units tablet, Take 2 tablets by mouth daily., Disp: , Rfl:   ???  furosemide (LASIX) 80 MG tablet, Take by mouth., Disp: , Rfl:   ???  gabapentin (NEURONTIN) 300 MG capsule, Take by mouth., Disp: , Rfl:   ???  hydrALAZINE (APRESOLINE) 50 MG tablet, Take by mouth., Disp: , Rfl:   ???  omeprazole (PRILOSEC) 20 MG capsule, Take by mouth., Disp: , Rfl:   Allergies:   Allergies   Allergen Reactions   ??? Vancomycin Other (See Comments)     Other reaction(s): Other (See Comments), Other (See Comments)  Drug induced NEUTROPENIA  Drug induced NEUTROPENIA  Drug induced NEUTROPENIA  Drug induced NEUTROPENIA  Drug induced NEUTROPENIA  Drug induced NEUTROPENIA     ??? Aspirin      Other reaction(s): Other (See Comments), Other (see comments), Other (See Comments)  Stomach bleeding  Stomach bleeding  Stomach bleeding  Stomach bleeding  Stomach bleeding  Stomach bleeding     ??? Ibuprofen Other (See Comments)     Other reaction(s):  Other (See Comments), Other (see comments)  Stomach bleeding  Stomach bleeding  Stomach bleeding     ??? Mushroom Hives and Itching   ??? Shellfish Derived Hives and Itching   ??? Sulfa (Sulfonamide Antibiotics) Nausea And Vomiting, Hives and Itching   ??? Sulfasalazine Hives and Itching   ??? Acetaminophen Itching   ??? Ciprofloxacin      Other reaction(s): Other (See Comments), Other (see comments)  Made bottom raw  Made bottom raw  Made bottom raw  Made bottom raw     ??? Coconut Oil    ??? Metronidazole      Other reaction(s): Other (See Comments), Other (see comments)  Made bottom raw  Made bottom raw  Made bottom raw  Made bottom raw     ??? Amoxicillin-Pot Clavulanate Itching     Has never had problems with amoxicillin/ penicillin    HAS TOLERATED ZOSYN WITH ZERO PROBLEMS  Has never had problems with amoxicillin/ penicillin    HAS TOLERATED ZOSYN WITH ZERO PROBLEMS  Has never had problems with amoxicillin/ penicillin    HAS TOLERATED ZOSYN WITH ZERO PROBLEMS  Has never had problems with amoxicillin/ penicillin    HAS TOLERATED ZOSYN WITH ZERO PROBLEMS     ??? Coconut Flavor Itching   ??? Codeine Itching   ??? Egg Derived Nausea And Vomiting   ??? Iodine Itching   ??? Metrizamide Hives     Takes Benadryl 50mg  PO before receiving iodinated contrast  Takes Benadryl 50mg  PO before receiving iodinated contrast  Takes Benadryl 50mg  PO before receiving iodinated contrast     ??? Povidone-Iodine Itching       Gentry Fitz      [facility]  MEDICAL SCREENING RE-EVALUATION    Patient reevaluated. Repeat VS and exam reassuring.  Blood sent.  Remains stable for the lobby awaiting room.     Fontaine Kossman  Hilma FavorsArozarena

## 2021-10-04 NOTE — Unmapped (Signed)
Patient presents to the Emergency Department with report of generalized fatigue and chills.  Patient reports she tested positive for COVID today.  Respirations easy and unlabored.  Skin warm and dry.

## 2021-10-04 NOTE — Unmapped (Signed)
[facility] Emergency Department Encounter     Date of evaluation: 10/04/2021    Chief Complaint     Reason for visit: Fatigue    Nursing notes, Past Medical History, Past Surgical History, Social History, Allergies, and Family History were reviewed.    Patient History     HPI: Veronica Winters is a 71 y.o. female with history of HTN, HLD, CHF who presents to the emergency department with suspected COVID.  The patient states that she has a positive COVID contact, her son.  Her symptoms started on Tuesday.  She is complaining of subjective fevers, chills, myalgias, cough, fatigue.  She states that she took a home COVID test that is positive.  She denies any chest pain, difficulty breathing, abdominal pain, nausea vomiting, diarrhea, lower extremity swelling, dysuria, urinary urgency or frequency.  She ambulates with a walker, which she is still able to do without difficulty.  Denies any other symptoms.  She has not tried anything for symptoms thus far.    With the exception of the above, there are no aggravating or alleviating factors.    Medical History:   Past Medical History:   Diagnosis Date   ??? Asthma    ??? CHF (congestive heart failure) (CMS-HCC)    ??? Hypercholesteremia    ??? Hypertension    ??? Renal disorder        Surgical History: History reviewed. No pertinent surgical history.    Medications: No current facility-administered medications for this encounter.    Current Outpatient Medications:   ???  albuterol (PROVENTIL) 2.5 mg /3 mL (0.083 %) nebulizer solution, Inhale into the lungs., Disp: , Rfl:   ???  amLODIPine (NORVASC) 5 MG tablet, Take 5 mg by mouth daily., Disp: , Rfl:   ???  atorvastatin (LIPITOR) 40 MG tablet, Take 1 tablet by mouth daily., Disp: , Rfl:   ???  buPROPion SR tablet 100mg , 12 HR Sustained-Release, Take by mouth., Disp: , Rfl:   ???  carvediloL (COREG) 25 MG tablet, Take 1 tablet by mouth 2 times a day., Disp: , Rfl:   ???  cefUROXime (CEFTIN) 500 MG tablet, Take 500 mg  by mouth., Disp: , Rfl:   ???  cholecalciferol, vitamin D3, 1000 units tablet, Take 2 tablets by mouth daily., Disp: , Rfl:   ???  furosemide (LASIX) 80 MG tablet, Take by mouth., Disp: , Rfl:   ???  gabapentin (NEURONTIN) 300 MG capsule, Take by mouth., Disp: , Rfl:   ???  hydrALAZINE (APRESOLINE) 50 MG tablet, Take by mouth., Disp: , Rfl:   ???  nirmatrelvir 150 mg - ritonavir 100 mg (PAXLOVID) 300 mg (150 mg x 2)-100 mg DsPk, Take 3 tablet combination (2 x 150 mg nirmatrelvir tablets, 300 mg total) (1 x 100 mg ritonavir tablet) by mouth twice daily for 5 days., Disp: 5 packet, Rfl: 0  ???  omeprazole (PRILOSEC) 20 MG capsule, Take by mouth., Disp: , Rfl:     Social History: Veronica Winters  reports that she has never smoked. She has never used smokeless tobacco. She reports that she does not currently use alcohol. She reports that she does not currently use drugs.    Allergies:   Allergies as of 10/04/2021 - Fully Reviewed 10/04/2021   Allergen Reaction Noted   ??? Vancomycin Other (See Comments) 05/21/2017   ??? Aspirin  10/14/2011   ??? Ibuprofen Other (See Comments) 10/17/2015   ??? Mushroom Hives and Itching 01/09/2012   ???  Shellfish derived Hives and Itching 01/09/2012   ??? Sulfa (sulfonamide antibiotics) Nausea And Vomiting, Hives, and Itching 10/14/2011   ??? Sulfasalazine Hives and Itching 10/14/2011   ??? Acetaminophen Itching 03/27/2017   ??? Ciprofloxacin  05/18/2017   ??? Coconut oil  07/28/2019   ??? Metronidazole  05/18/2017   ??? Amoxicillin-pot clavulanate Itching 04/26/2016   ??? Coconut flavor Itching 04/20/2013   ??? Codeine Itching 10/14/2011   ??? Egg derived Nausea And Vomiting 01/09/2012   ??? Iodine Itching 10/14/2011   ??? Metrizamide Hives 10/14/2011   ??? Povidone-iodine Itching 10/14/2011        Review of Systems     Review of Systems   Constitutional: Positive for chills, fever and malaise/fatigue.   HENT: Negative for congestion, ear pain and sore throat.    Eyes: Negative for blurred vision, double vision and photophobia.    Respiratory: Positive for cough.    Cardiovascular: Negative for chest pain, palpitations and leg swelling.   Gastrointestinal: Negative for abdominal pain, diarrhea, nausea and vomiting.   Genitourinary: Negative for dysuria, frequency and urgency.   Musculoskeletal: Negative for back pain, falls and myalgias.   Skin: Negative for rash.   Neurological: Negative for focal weakness, loss of consciousness and headaches.   Psychiatric/Behavioral: Negative for depression and suicidal ideas.      As stated above, all other systems reviewed and are otherwise negative.     Patient History     Vitals:    10/04/21 1431 10/04/21 2020 10/04/21 2054   BP: (!) 159/92 (!) 173/102 (!) 160/102   BP Location: Right arm Left arm Left arm   Patient Position: Sitting Sitting Sitting   Pulse: 78 80    Resp: 20 20 22    Temp: 98.7 ??F (37.1 ??C) 98.3 ??F (36.8 ??C) 99.4 ??F (37.4 ??C)   TempSrc: Oral Oral Oral   SpO2: 99% 95% 96%       Physical Exam  Constitutional:       General: She is not in acute distress.     Appearance: Normal appearance. She is obese. She is not ill-appearing, toxic-appearing or diaphoretic.   HENT:      Head: Normocephalic and atraumatic.      Right Ear: External ear normal.      Left Ear: External ear normal.      Nose: Nose normal.      Mouth/Throat:      Mouth: Mucous membranes are moist.   Eyes:      Extraocular Movements: Extraocular movements intact.      Pupils: Pupils are equal, round, and reactive to light.   Cardiovascular:      Rate and Rhythm: Normal rate and regular rhythm.      Pulses: Normal pulses.      Heart sounds: Normal heart sounds.   Pulmonary:      Effort: Pulmonary effort is normal. No respiratory distress.      Breath sounds: Normal breath sounds. No wheezing, rhonchi or rales.   Abdominal:      General: Abdomen is flat. Bowel sounds are normal. There is no distension.      Palpations: Abdomen is soft.      Tenderness: There is no abdominal tenderness. There is no guarding or rebound.    Musculoskeletal:      Cervical back: Normal range of motion and neck supple.      Right lower leg: No edema.      Left lower leg: No edema.   Skin:  Capillary Refill: Capillary refill takes less than 2 seconds.      Findings: No rash.   Neurological:      Mental Status: She is alert and oriented to person, place, and time.   Psychiatric:         Mood and Affect: Mood normal.         Behavior: Behavior normal.         Diagnostic Studies     Labs: Please see electronic medical record for any tests performed in the ED   Labs Reviewed   BASIC METABOLIC PANEL - Abnormal; Notable for the following components:       Result Value    Glucose 118 (*)     All other components within normal limits   CBC - Abnormal; Notable for the following components:    RBC 5.26 (*)     Platelets 123 (*)     All other components within normal limits   DIFFERENTIAL - Abnormal; Notable for the following components:    Monocytes Relative 34.1 (*)     Lymphocytes Absolute 745 (*)     Monocytes Absolute 1,330 (*)     All other components within normal limits   VENOUS BLOOD GAS, LINE/SYRINGE - Abnormal; Notable for the following components:    PO2-Line Draw 23 (*)     HCO3-Line Draw 31 (*)     CO2 Content-Line Draw 32 (*)     Base Excess-Line Draw 4.0 (*)     %HBO2-Line Draw 31.9 (*)     Reduced Hemoglobin-Line Draw 65.8 (*)     All other components within normal limits   ED ECG 12-LEAD (MUSE)    Narrative:     Ventricular Rate:  78  BPM  Atrial Rate:  78  BPM  P-R Interval:  160  ms  QRS Duration:  70  ms  QT:  382  ms  QTc:  435  ms  R Axis:  21  degrees  T Axis:  -19  degrees  Diagnosis Line:  NORMAL SINUS RHYTHM ^ LOW VOLTAGE QRS COMPLEXES ^ CANNOT RULE OUT ANTERIOR INFARCT , AGE UNDETERMINED ^ ABNORMAL ECG       IMAGING STUDIES / RADIOLOGY: Please see electronic medical record for any tests performed in the ED  No orders to display       EKG:  Seen and Interpreted by myself and the attending physician, Dr. Annett Fabian  Indication:  dyspnea  Finding: Normal sinus rhythm  No images available for comparison  Ventricular Rate: 78 bpm  PR interval: 160 ms  QRS Duration: 70 ms  QT/QTc: 382/435 ms  ST Segments: no elevation or depression  T waves: inversions in leads II, III, V3-6    ED Procedures     Procedures  None    ED Course     ED Medications Administered:  Medications   acetaminophen (TYLENOL) tablet 975 mg (975 mg Oral Given 10/04/21 2150)       CONSULTS: None     MDM     Vitals:    10/04/21 1431 10/04/21 2020 10/04/21 2054   BP: (!) 159/92 (!) 173/102 (!) 160/102   BP Location: Right arm Left arm Left arm   Patient Position: Sitting Sitting Sitting   Pulse: 78 80    Resp: 20 20 22    Temp: 98.7 ??F (37.1 ??C) 98.3 ??F (36.8 ??C) 99.4 ??F (37.4 ??C)   TempSrc: Oral Oral Oral   SpO2: 99% 95% 96%  Latrecia A Kindig is a 71 y.o. female who presents to the emergency department with fatigue, fever, chills, myalgias.  The patient had 2 positive home COVID tests.  Please see HPI for more details.  On my exam, patient is slightly hypertensive but otherwise vital signs are normal.  She is saturating well on room air and maintained 98% oxygen saturation during ambulation on my exam as well.  On otherwise unremarkable physical exam.  No peripheral edema, or other significant findings.    Triage labs obtained including CBC, BMP, VBG were largely unremarkable.  No leukocytosis, acute anemia.  Creatinine is normal at 0.98.  VBG shows normal pH.  EKG shows normal sinus rhythm.  It is shows some T wave inversions.  Unfortunately do not have an image available to compare to previous, however the patient does not have any chest pain or other concerning symptoms for ACS.  The patient's symptoms are likely all related to COVID-19.  Given that her symptoms have only been occurring for about 2 days now, also low suspicion of bacterial pneumonia.  Given that she is maintaining oxygen saturations including with ambulation and she has no other concerning findings on  vitals or work-up, she does not need to have any further work-up or admission today.  She will be stable for outpatient treatment.  She was given a prescription for Paxlovid given that her onset of symptoms was less than 5 days and she does meet treatment criteria for this.  She was given very strict return precautions including worsening shortness of breath, chest pain, difficulty breathing, inability to ambulate or any other concerning symptoms.  Otherwise instructed to make an appointment with her primary care provider for follow-up.  She is in agreement with this plan and she was discharged in stable condition.    Medical Decision Making  COVID-19: acute illness or injury  Amount and/or Complexity of Data Reviewed  Labs: ordered. Decision-making details documented in ED Course.  ECG/medicine tests: ordered and independent interpretation performed. Decision-making details documented in ED Course.      Risk  OTC drugs.  Prescription drug management.          The patient was evaluated by myself and the ED Attending Physician, Dr. Annett Fabian. All management and disposition plans were discussed and agreed upon.    This note was dictated using voice-recognition software, which occasionally leads to inadvertent typographic errors.    Clinical Impression     1. COVID-19        Disposition     Discharged from the ED. See AVS for prescriptions, followup, and discharge instructions.     DISCHARGE MEDICATIONS:   New Prescriptions    NIRMATRELVIR 150 MG - RITONAVIR 100 MG (PAXLOVID) 300 MG (150 MG X 2)-100 MG DSPK    Take 3 tablet combination (2 x 150 mg nirmatrelvir tablets, 300 mg total) (1 x 100 mg ritonavir tablet) by mouth twice daily for 5 days.       PATIENT REFERRED TO:   [facility] Emergency Department  [address]  [place] [address]  [phone]    If symptoms worsen      Andris Flurry, PA-C  Department of Emergency Medicine     [place], [place]  10/04/21 2158

## 2021-10-04 NOTE — Unmapped (Signed)
Patient remains in lobby while awaiting bed placement. Reassessed by this RN. Patient is alert and oriented, respirations unlabored, and no change in patients presenting condition at this time. Triage protocol orders placed and results reviewed. VS updated. No immediate action or escalation needed at this time. Patient updated on any delay and plan. Patient verbalizes understanding and denies additional needs at this time.  Will continue to monitor while waiting in Lobby.

## 2021-10-04 NOTE — Unmapped (Signed)
Pt ambulated several feet with this RN and MD. Pt maintained SpO2 of 95%-98%.

## 2021-10-04 NOTE — Unmapped (Signed)
Pt verbalizes understanding of discharge instructions and has been given the opportunity to ask questions. IV removed. Due to infection precautions, patient is unable to sign at this time. Patient ambulated to exit, gait steady.

## 2021-10-05 LAB — BASIC METABOLIC PANEL
Anion Gap: 11 mmol/L (ref 3–16)
BUN: 11 mg/dL (ref 7–25)
CO2: 26 mmol/L (ref 21–33)
Calcium: 9.8 mg/dL (ref 8.6–10.3)
Chloride: 103 mmol/L (ref 98–110)
Creatinine: 0.98 mg/dL (ref 0.60–1.30)
EGFR: 61
Glucose: 118 mg/dL (ref 70–100)
Osmolality, Calculated: 290 mOsm/kg (ref 278–305)
Potassium: 4.5 mmol/L (ref 3.5–5.3)
Sodium: 140 mmol/L (ref 133–146)

## 2021-10-05 LAB — VENOUS BLOOD GAS, LINE/SYRINGE
%HBO2-Line Draw: 31.9 % (ref 40.0–70.0)
Base Excess-Line Draw: 4 mmol/L (ref <=3.0)
CO2 Content-Line Draw: 32 mmol/L (ref 25–29)
Carboxyhgb-Line Draw: 1.4 % (ref 0.0–2.0)
HCO3-Line Draw: 31 mmol/L (ref 24–28)
Methemoglobin-Line Draw: 0.9 % (ref 0.0–1.5)
PCO2-Line Draw: 51 mm Hg (ref 41–51)
PH-Line Draw: 7.38 (ref 7.32–7.42)
PO2-Line Draw: 23 mm Hg (ref 25–40)
Reduced Hemoglobin-Line Draw: 65.8 % (ref 0.0–5.0)

## 2021-10-05 LAB — CBC
Hematocrit: 44.1 % (ref 35.0–45.0)
Hemoglobin: 14.7 g/dL (ref 11.7–15.5)
MCH: 27.9 pg (ref 27.0–33.0)
MCHC: 33.3 g/dL (ref 32.0–36.0)
MCV: 83.8 fL (ref 80.0–100.0)
MPV: 9.4 fL (ref 7.5–11.5)
Platelets: 123 10*3/uL (ref 140–400)
RBC: 5.26 10*6/uL (ref 3.80–5.10)
RDW: 14.9 % (ref 11.0–15.0)
WBC: 3.9 10*3/uL (ref 3.8–10.8)

## 2021-10-05 LAB — DIFFERENTIAL
Basophils Absolute: 16 /uL (ref 0–200)
Basophils Relative: 0.4 % (ref 0.0–1.0)
Eosinophils Absolute: 43 /uL (ref 15–500)
Eosinophils Relative: 1.1 % (ref 0.0–8.0)
Lymphocytes Absolute: 745 /uL (ref 850–3900)
Lymphocytes Relative: 19.1 % (ref 15.0–45.0)
Monocytes Absolute: 1330 /uL (ref 200–950)
Monocytes Relative: 34.1 % (ref 0.0–12.0)
Neutrophils Absolute: 1767 /uL (ref 1500–7800)
Neutrophils Relative: 45.3 % (ref 40.0–80.0)
nRBC: 0 /100 WBC (ref 0–0)

## 2021-10-15 ENCOUNTER — Encounter: Payer: PRIVATE HEALTH INSURANCE | Attending: Student in an Organized Health Care Education/Training Program

## 2021-11-08 ENCOUNTER — Ambulatory Visit: Admit: 2021-11-08 | Discharge: 2021-11-08 | Payer: PRIVATE HEALTH INSURANCE

## 2021-11-08 DIAGNOSIS — I10 Essential (primary) hypertension: Secondary | ICD-10-CM

## 2021-11-08 MED ORDER — VALSARTAN 320 MG PO TABS
320 MG | ORAL_TABLET | Freq: Every day | ORAL | 2 refills | Status: AC
Start: 2021-11-08 — End: 2022-02-06

## 2021-11-08 NOTE — Progress Notes (Signed)
[facility]    Gabrielle Wells is a 71 y.o. female, here for evaluation of the following concerns:    HPI  Patient is a 71 year old female with PMHx of morbid obesity, resistant HTN, HFpEF, CKD, GERD, MGUS who presented for routine follow-up.  Patient recently presented to the ED on 2/16, found positive for COVID, and discharged on 5-day course of Paxlovid.  Patient also seen by cardiology Dr. Keenan Bachelor in 2/7 who increased her Coreg to 50 mg twice daily.  Patient also was seen by nephrology Dr. Silvestre Moment.  Chlorothiazide and amlodipine were discontinued and she was started on losartan.  Patient continues to have dyspnea on exertion and bilateral lower extremity swelling she states is not changed from prior.  Patient states that her blood pressure varies but 1 measurement several days ago she was found to have an SBP in the 190s.  Patient otherwise does not have acute complaints.  She denies any fevers, chest pain, nausea/vomiting, or abdominal pain.    Patient also appears depressed and states that she feels down from having to deal with all her medical problems.  Patient stopped taking Cymbalta as she stated she did not feel like it worked and is interested in alternatives.    Review of Systems   Constitutional:  Negative for fever.   Respiratory:  Positive for shortness of breath. Negative for cough.    Cardiovascular:  Negative for chest pain.   Gastrointestinal:  Negative for abdominal pain, nausea and vomiting.   Genitourinary:  Negative for dysuria.     MEDICATIONS:  Prior to Visit Medications    Medication Sig Taking? Authorizing Provider   valsartan (DIOVAN) 320 MG tablet Take 1 tablet by mouth daily Yes Annice Needy, MD   furosemide (LASIX) 20 MG tablet Take 1 tablet by mouth daily  Arshdeep Tindni, MD   carvedilol (COREG) 25 MG tablet Take 2 tablets by mouth 2 times daily  Hervey Ard, MD   atorvastatin (LIPITOR) 40 MG tablet Take 1 tablet by mouth daily  Minna Antis, MD   DULoxetine (CYMBALTA) 30 MG extended release capsule TAKE ONE CAPSULE BY MOUTH DAILY  Elmyra Ricks, MD   Cholecalciferol (VITAMIN D3) 25 MCG TABS TAKE TWO TABLETS BY MOUTH DAILY  Elmyra Ricks, MD   Cholecalciferol (VITAMIN D3) 25 MCG TABS TAKE TWO TABLETS BY MOUTH DAILY  Elmyra Ricks, MD   gabapentin (NEURONTIN) 300 MG capsule Take 1 capsule by mouth 3 times daily for 90 days.  Adrian Saran, MD   albuterol sulfate HFA (VENTOLIN HFA) 108 (90 Base) MCG/ACT inhaler Inhale 2 puffs into the lungs 4 times daily as needed for Wheezing  Nemiah Commander, MD   tiZANidine (ZANAFLEX) 2 MG tablet Take 1 tablet by mouth nightly as needed (muscle spasms)  Nemiah Commander, MD   ondansetron (ZOFRAN-ODT) 4 MG disintegrating tablet DISSOLVE 1 TABLET ON THE TONGUE EVERY 8 HOURS AS NEEDED FOR NAUSEA  Nemiah Commander, MD   dicyclomine (BENTYL) 10 MG capsule TAKE ONE CAPSULE BY MOUTH FOUR TIMES DAILY  Nemiah Commander, MD   Handicap Placard MISC by Does not apply route 07/05/2021-07/05/2024  Jason Fila, MD   hydrALAZINE (APRESOLINE) 50 MG tablet Take 1 tablet by mouth 3 times daily  Arshdeep Tindni, MD   omeprazole (PRILOSEC) 20 MG delayed release capsule Take 1 capsule by mouth every morning (before breakfast)  Jhonnie Garner, MD   acetaminophen (TYLENOL) 500 MG tablet Take 500 mg by mouth every 6  hours as needed for Pain   Historical Provider, MD        Vitals:    11/08/21 1346   BP: (!) 160/83   Site: Left Lower Arm   Position: Sitting   Cuff Size: Medium Adult   Pulse: 61   Resp: 20   Temp: 97.3 ??F (36.3 ??C)   TempSrc: Temporal   SpO2: 95%   Weight: 279 lb 11.2 oz (126.9 kg)   Height: '5\' 3"'$  (1.6 m)      Estimated body mass index is 49.55 kg/m?? as calculated from the following:    Height as of this encounter: '5\' 3"'$  (1.6 m).    Weight as of this encounter: 279 lb 11.2 oz (126.9 kg).  Physical Exam  Constitutional:       General: She is not in acute distress.  Eyes:      Conjunctiva/sclera: Conjunctivae normal.   Cardiovascular:       Rate and Rhythm: Normal rate and regular rhythm.      Pulses: Normal pulses.      Heart sounds: Normal heart sounds. No murmur heard.  Pulmonary:      Effort: Pulmonary effort is normal. No respiratory distress.      Breath sounds: Normal breath sounds.   Abdominal:      General: Abdomen is flat.      Palpations: Abdomen is soft.      Tenderness: There is no abdominal tenderness.   Neurological:      Mental Status: She is oriented to person, place, and time.   Psychiatric:      Comments: Patient with depressed mood       ASSESSMENT/PLAN:     1. Resistant hypertension  Chlorothiazide and amlodipine were recently discontinued and she was started on losartan. However patient states that her blood pressure varies but 1 measurement several days ago she was found to have an SBP in the 190s. BP today 160/83  -Stopped losartan and started valsartan '320mg'$  instead  -Continue Lasix  2. Chronic diastolic congestive heart failure [facility])  Patient follows up with cardiology Dr. 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 Bella Kennedy.   4.CKD   -Follows up with Dr Silvestre Moment    Return in about 2 months (around 01/08/2022).    The patient was staffed with teaching attending: Dr. Joeseph Amor.    An electronic signature was used to authenticate this note.    --Annice Needy, MD

## 2021-11-08 NOTE — Patient Instructions (Addendum)
-  Please stop taking your losartan and start taking valsartan '320mg'$  daily instead   -We would like to please see you again in about 2 months

## 2021-11-09 NOTE — Progress Notes (Signed)
Order on paper script given to patient to order 2 safety bars at her new apt.

## 2021-11-14 ENCOUNTER — Encounter

## 2021-11-14 LAB — RENAL FUNCTION PANEL
Albumin: 4.2 g/dL (ref 3.4–5.0)
Anion Gap: 15 (ref 3–16)
BUN: 8 mg/dL (ref 7–20)
CO2: 24 mmol/L (ref 21–32)
Calcium: 9 mg/dL (ref 8.3–10.6)
Chloride: 106 mmol/L (ref 99–110)
Creatinine: 1.1 mg/dL (ref 0.6–1.2)
Est, Glom Filt Rate: 53 — AB (ref >60)
Glucose: 96 mg/dL (ref 70–99)
Phosphorus: 3.2 mg/dL (ref 2.5–4.9)
Potassium: 3.4 mmol/L — ABNORMAL LOW (ref 3.5–5.1)
Sodium: 145 mmol/L (ref 136–145)

## 2021-11-14 LAB — BRAIN NATRIURETIC PEPTIDE: Pro-BNP: 458 pg/mL — ABNORMAL HIGH (ref 0–124)

## 2021-11-17 LAB — RENIN ACTIVITY AND ALDOSTERONE
Aldosterone/Renin Activity Calculation: 21.5 ratio (ref <=25.0)
Aldosterone: 6.4 ng/dL
Renin Activity: 0.3 ng/mL/hr

## 2021-11-17 LAB — METANEPHRINES PLASMA FREE
Metanephrine, Free: 0.11 nmol/L (ref 0.00–0.49)
Normetanephrine, Free: 0.5 nmol/L (ref 0.00–0.89)

## 2021-11-27 ENCOUNTER — Encounter: Payer: PRIVATE HEALTH INSURANCE | Attending: Nephrology

## 2021-12-09 ENCOUNTER — Emergency Department: Payer: PRIVATE HEALTH INSURANCE

## 2021-12-09 ENCOUNTER — Observation Stay
Admission: EM | Admit: 2021-12-09 | Discharge: 2021-12-12 | Disposition: A | Discharge status: Home Health | Payer: PRIVATE HEALTH INSURANCE

## 2021-12-09 ENCOUNTER — Emergency Department: Admit: 2021-12-09 | Payer: PRIVATE HEALTH INSURANCE

## 2021-12-09 DIAGNOSIS — I130 Hypertensive heart and chronic kidney disease with heart failure and stage 1 through stage 4 chronic kidney disease, or unspecified chronic kidney disease: Secondary | ICD-10-CM

## 2021-12-09 LAB — DIFFERENTIAL
Atypical Lymphocytes Relative: 4 % (ref 0.0–9.0)
Basophils Absolute: 34 /uL (ref 0–200)
Basophils Relative: 1 % (ref 0.0–1.0)
Eosinophils Absolute: 133 /uL (ref 15–500)
Eosinophils Relative: 3.9 % (ref 0.0–8.0)
Lymphocytes Absolute: 1482 /uL (ref 850–3900)
Lymphocytes Relative: 39.6 % (ref 15.0–45.0)
Monocytes Absolute: 809 /uL (ref 200–950)
Monocytes Relative: 23.8 % — ABNORMAL HIGH (ref 0.0–12.0)
Neutrophils Absolute: 942 /uL — ABNORMAL LOW (ref 1500–7800)
Neutrophils Relative: 27.7 % (ref 40.0–80.0)
Ovalocytes: 0
PLT Morphology: NORMAL
RBC Morphology: NORMAL

## 2021-12-09 LAB — CBC
Hematocrit: 43.3 % (ref 35.0–45.0)
Hemoglobin: 14.2 g/dL (ref 11.7–15.5)
MCH: 27.6 pg (ref 27.0–33.0)
MCHC: 32.7 g/dL (ref 32.0–36.0)
MCV: 84.4 fL (ref 80.0–100.0)
MPV: 10 fL (ref 7.5–11.5)
Platelets: 101 10*3/uL (ref 140–400)
RBC: 5.13 10*6/uL (ref 3.80–5.10)
RDW: 15.5 % (ref 11.0–15.0)
WBC: 3.4 10*3/uL (ref 3.8–10.8)

## 2021-12-09 LAB — HIGH SENSITIVITY TROPONIN
High Sensitivity Troponin: 7 ng/L (ref 0–14)
High Sensitivity Troponin: 6 ng/L (ref 0–14)

## 2021-12-09 LAB — BASIC METABOLIC PANEL
Anion Gap: 12 mmol/L (ref 3–16)
BUN: 9 mg/dL (ref 7–25)
CO2: 24 mmol/L (ref 21–33)
Calcium: 9.2 mg/dL (ref 8.6–10.3)
Chloride: 106 mmol/L (ref 98–110)
Creatinine: 1.07 mg/dL (ref 0.60–1.30)
EGFR: 55
Glucose: 94 mg/dL (ref 70–100)
Osmolality, Calculated: 292 mosm/kg (ref 278–305)
Potassium: 3.7 mmol/L (ref 3.5–5.3)
Sodium: 142 mmol/L (ref 133–146)

## 2021-12-09 LAB — MAGNESIUM: Magnesium: 1.8 mg/dL (ref 1.5–2.5)

## 2021-12-09 LAB — B NATRIURETIC PEPTIDE: BNP: 201 pg/mL (ref 0–100)

## 2021-12-09 LAB — PHOSPHORUS: Phosphorus: 2.8 mg/dL (ref 2.1–4.5)

## 2021-12-09 MED ORDER — amLODIPine (NORVASC) tablet 5 mg
10 | Freq: Every day | ORAL | Status: AC
Start: 2021-12-09 — End: 2021-12-09
  Administered 2021-12-09: 23:00:00 5 mg via ORAL

## 2021-12-09 MED ORDER — electrolyte-R (pH 7.4) (NORMOSOL-R pH 7.4) iv solution SolP
INTRAVENOUS | Status: AC
Start: 2021-12-09 — End: 2021-12-10
  Administered 2021-12-10: 03:00:00 250 mL/h via INTRAVENOUS

## 2021-12-09 MED ORDER — albuterol (PROVENTIL) nebulizer solution 2.5 mg
2.5 | RESPIRATORY_TRACT | Status: AC | PRN
Start: 2021-12-09 — End: 2021-12-12

## 2021-12-09 MED ORDER — potassium chloride (KLOR-CON M20) CR tablet 40 mEq
20 | Freq: Once | ORAL | Status: AC
Start: 2021-12-09 — End: 2021-12-09
  Administered 2021-12-10: 03:00:00 40 meq via ORAL

## 2021-12-09 MED ORDER — valsartan (DIOVAN) tablet 320 mg
320 | Freq: Every day | ORAL | Status: AC
Start: 2021-12-09 — End: 2021-12-10

## 2021-12-09 MED ORDER — carvediloL (COREG) tablet 50 mg
25 | Freq: Two times a day (BID) | ORAL | Status: AC
Start: 2021-12-09 — End: 2021-12-10

## 2021-12-09 MED ORDER — gabapentin (NEURONTIN) capsule 100 mg
100 | Freq: Three times a day (TID) | ORAL | Status: AC
Start: 2021-12-09 — End: 2021-12-12
  Administered 2021-12-10 – 2021-12-12 (×7): 100 mg via ORAL

## 2021-12-09 MED ORDER — tiZANidine (ZANAFLEX) tablet 2 mg
4 | Freq: Three times a day (TID) | ORAL | Status: AC | PRN
Start: 2021-12-09 — End: 2021-12-09
  Administered 2021-12-10: 02:00:00 2 mg via ORAL

## 2021-12-09 MED ORDER — pantoprazole (PROTONIX) EC tablet 40 mg
40 | Freq: Every day | ORAL | Status: AC
Start: 2021-12-09 — End: 2021-12-11
  Administered 2021-12-10 – 2021-12-11 (×2): 40 mg via ORAL

## 2021-12-09 MED ORDER — nitroGLYCERIN (NITROSTAT) SL tablet 0.4 mg
0.4 | SUBLINGUAL | Status: AC | PRN
Start: 2021-12-09 — End: 2021-12-09

## 2021-12-09 MED ORDER — pneumococcal 23 polyvalent vaccine (PNEUMOVAX-23) injection 0.5 mL
25 | INTRAMUSCULAR | Status: AC
Start: 2021-12-09 — End: 2021-12-12

## 2021-12-09 MED ORDER — amLODIPine (NORVASC) tablet 5 mg
10 | Freq: Every day | ORAL | Status: AC
Start: 2021-12-09 — End: 2021-12-09

## 2021-12-09 MED ORDER — cholecalciferol (vitamin D3) tablet 2,000 Units
1000 | Freq: Every day | ORAL | Status: AC
Start: 2021-12-09 — End: 2021-12-12
  Administered 2021-12-10 – 2021-12-12 (×3): 2000 [IU] via ORAL

## 2021-12-09 MED ORDER — hydrALAZINE (APRESOLINE) 20 mg/mL injection 5 mg
20 | Freq: Once | INTRAMUSCULAR | Status: AC
Start: 2021-12-09 — End: 2021-12-09
  Administered 2021-12-09: 23:00:00 5 mg via INTRAVENOUS

## 2021-12-09 MED ORDER — magnesium sulfate in D5W 100 mL 1 gram/100 mL IVPB 1 g
1 | Freq: Once | INTRAVENOUS | Status: AC
Start: 2021-12-09 — End: 2021-12-09
  Administered 2021-12-09: 23:00:00 1 g via INTRAVENOUS

## 2021-12-09 MED ORDER — gabapentin (NEURONTIN) capsule 300 mg
300 | Freq: Three times a day (TID) | ORAL | Status: AC
Start: 2021-12-09 — End: 2021-12-09
  Administered 2021-12-10: 02:00:00 300 mg via ORAL

## 2021-12-09 MED ORDER — furosemide (LASIX) injection 60 mg
10 | Freq: Once | INTRAMUSCULAR | Status: AC
Start: 2021-12-09 — End: 2021-12-09
  Administered 2021-12-09: 23:00:00 60 mg via INTRAVENOUS

## 2021-12-09 MED ORDER — tiZANidine (ZANAFLEX) tablet 1 mg
2 | Freq: Three times a day (TID) | ORAL | Status: AC | PRN
Start: 2021-12-09 — End: 2021-12-12
  Administered 2021-12-10 – 2021-12-12 (×2): 1 mg via ORAL

## 2021-12-09 MED ORDER — potassium chloride (KLOR-CON M20) CR tablet 40 mEq
20 | Freq: Once | ORAL | Status: AC
Start: 2021-12-09 — End: 2021-12-10
  Administered 2021-12-10: 08:00:00 40 meq via ORAL

## 2021-12-09 MED ORDER — heparin (porcine) injection 5,000 Units
5000 | Freq: Three times a day (TID) | INTRAMUSCULAR | Status: AC
Start: 2021-12-09 — End: 2021-12-12
  Administered 2021-12-10 – 2021-12-12 (×8): 5000 [IU] via SUBCUTANEOUS

## 2021-12-09 MED ORDER — atorvastatin (LIPITOR) tablet 40 mg
20 | Freq: Every day | ORAL | Status: AC
Start: 2021-12-09 — End: 2021-12-12
  Administered 2021-12-10 – 2021-12-12 (×3): 40 mg via ORAL

## 2021-12-09 MED FILL — AMLODIPINE 10 MG TABLET: 10 10 MG | ORAL | Qty: 1

## 2021-12-09 MED FILL — TIZANIDINE 2 MG TABLET: 2 2 MG | ORAL | Qty: 1

## 2021-12-09 MED FILL — MAGNESIUM SULFATE 1 GRAM/100 ML IN DEXTROSE 5 % INTRAVENOUS PIGGYBACK: 1 1 gram/100 mL | INTRAVENOUS | Qty: 100

## 2021-12-09 MED FILL — TIZANIDINE 4 MG TABLET: 4 4 MG | ORAL | Qty: 1

## 2021-12-09 MED FILL — POTASSIUM CHLORIDE ER 20 MEQ TABLET,EXTENDED RELEASE(PART/CRYST): 20 20 MEQ | ORAL | Qty: 2

## 2021-12-09 MED FILL — FUROSEMIDE 10 MG/ML INJECTION SOLUTION: 10 10 mg/mL | INTRAMUSCULAR | Qty: 6

## 2021-12-09 MED FILL — HYDRALAZINE 20 MG/ML INJECTION SOLUTION: 20 20 mg/mL | INTRAMUSCULAR | Qty: 1

## 2021-12-09 NOTE — Unmapped (Signed)
Patient did not take her meds this morning for afib/blood pressure and experienced dizziness

## 2021-12-09 NOTE — Unmapped (Signed)
Patient hypotensive. Team made aware and in route.

## 2021-12-09 NOTE — Unmapped (Signed)
ED Attending Attestation Note    Date of service:  12/09/2021    This patient was seen by the resident physician.  I have seen and examined the patient, agree with the workup, evaluation, management and diagnosis. The care plan has been discussed and I concur.  I have reviewed the ECG and concur with the resident's interpretation.    My assessment reveals a 71 y.o. female with a h/o HFpEF and CKD secondary to uncontrolled hypertension currently on lasix 40 mg daily presenting today with concern for progressive orthopnea, dyspnea, and weight gain.  She believes that her dry weight is 240 pounds, though it seems that she weighed 281 pounds at her last cardiology visit on 09/25/2021.  Today she weighs 278 with a standing weight.  On exam she does have trace pitting edema of her bilateral lower extremities with clear lungs, though she does seem dyspneic with minimal movement.  She came today because she felt some fluttering of her heart for a few minutes at church, however we have not picked up any dysrhythmia on telemetry monitoring.  Initial troponin is 7 with low concern for ACS, repeat pending.

## 2021-12-09 NOTE — Unmapped (Signed)
Bed: D18U  Expected date:   Expected time:   Means of arrival:   Comments:  D Leavy CellaBoyd

## 2021-12-09 NOTE — Unmapped (Signed)
[facility]  MEDICAL SCREENING EXAM    Date of Service: 12/09/2021    Reason for Visit: Dizziness      MSE Plan     Patient evaluated from the lobby for a medical screening exam.  In short, this is a patient presenting with lightheadedness, SOB, CP. To further evaluate their complaints, the following orders have been placed:     Labs Reviewed   BASIC METABOLIC PANEL   B NATRIURETIC PEPTIDE   CBC   DIFFERENTIAL   MAGNESIUM   PHOSPHORUS   HIGH SENSITIVITY TROPONIN   HIGH SENSITIVITY TROPONIN   ED ECG 12-LEAD (MUSE)    Narrative:     Ventricular Rate:  73  BPM  Atrial Rate:  73  BPM  P-R Interval:  150  ms  QRS Duration:  78  ms  QT:  420  ms  QTc:  462  ms  P Axis:  -13  degrees  R Axis:  32  degrees  T Axis:  -4  degrees  Diagnosis Line:  NORMAL SINUS RHYTHM ^ LOW VOLTAGE QRS COMPLEXES ^ CANNOT RULE OUT ANTERIOR INFARCT , AGE UNDETERMINED ^ ABNORMAL ECG     X-ray Portable Chest    (Results Pending)     No results found for this or any previous visit (from the past 4160 hour(s)).    Stable for lobby while awaiting ED bed. See primary provider's note for full details and final disposition.      Brief HPI     Veronica Winters is a 71 y.o. female presenting with asthma, CHF, HTN, HLD, CKD, obesity who presents to the ED today via EMS with complaints of lightheadedness, chest tightness, shortness of breath.  Has been having chronic diarrhea for the last couple of months, poor p.o. intake.  Got up at church today and felt lightheaded, like she could pass out.  Also felt some tightness in her chest and some shortness of breath.  Here with family member who was concerned about her.  Chest pain did not radiate.  No nausea, vomiting, diaphoresis with this episode, though states that she has been intermittently nauseated over the last month or 2    Pertinent Physical Exam     ED Triage Vitals [12/09/21 1616]   Enc Vitals Group      BP (!) 175/100      Heart Rate 64      Resp 18      Temp 97.8 ??F (36.6 ??C)       Temp Source Oral      SpO2 100 %      Weight       Height       Head Circumference       Peak Flow       Pain Score       Pain Loc       Pain Edu?       Excl. in GC?      Physical Exam  Vitals and nursing note reviewed.   Constitutional:       Appearance: She is obese.      Comments: Chronically ill appearing, dyspneic with exertion   HENT:      Head: Atraumatic.   Eyes:      Extraocular Movements: Extraocular movements intact.   Cardiovascular:      Rate and Rhythm: Normal rate and regular rhythm.   Pulmonary:      Effort: Pulmonary effort is normal.  Comments: Diminished no wheezes, rales or rhonchi appreciated  Musculoskeletal:         General: Normal range of motion.   Skin:     General: Skin is warm and dry.   Neurological:      General: No focal deficit present.      Mental Status: She is alert.         Patient History     Medical History:   Past Medical History:   Diagnosis Date   ??? Asthma    ??? CHF (congestive heart failure) (CMS-HCC)    ??? Hypercholesteremia    ??? Hypertension    ??? Renal disorder      Surgical History: No past surgical history on file.   Medications: No current facility-administered medications for this encounter.    Current Outpatient Medications:   ???  albuterol (PROVENTIL) 2.5 mg /3 mL (0.083 %) nebulizer solution, Inhale into the lungs., Disp: , Rfl:   ???  amLODIPine (NORVASC) 5 MG tablet, Take 5 mg by mouth daily., Disp: , Rfl:   ???  atorvastatin (LIPITOR) 40 MG tablet, Take 1 tablet by mouth daily., Disp: , Rfl:   ???  buPROPion SR tablet 100mg , 12 HR Sustained-Release, Take by mouth., Disp: , Rfl:   ???  carvediloL (COREG) 25 MG tablet, Take 1 tablet by mouth 2 times a day., Disp: , Rfl:   ???  cefUROXime (CEFTIN) 500 MG tablet, Take 500 mg by mouth., Disp: , Rfl:   ???  cholecalciferol, vitamin D3, 1000 units tablet, Take 2 tablets by mouth daily., Disp: , Rfl:   ???  furosemide (LASIX) 80 MG tablet, Take by mouth., Disp: , Rfl:   ???  gabapentin (NEURONTIN) 300 MG capsule, Take by mouth.,  Disp: , Rfl:   ???  hydrALAZINE (APRESOLINE) 50 MG tablet, Take by mouth., Disp: , Rfl:   ???  nirmatrelvir 150 mg - ritonavir 100 mg (PAXLOVID) 300 mg (150 mg x 2)-100 mg DsPk, Take 3 tablet combination (2 x 150 mg nirmatrelvir tablets, 300 mg total) (1 x 100 mg ritonavir tablet) by mouth twice daily for 5 days., Disp: 5 packet, Rfl: 0  ???  omeprazole (PRILOSEC) 20 MG capsule, Take by mouth., Disp: , Rfl:   Allergies:   Allergies   Allergen Reactions   ??? Vancomycin Other (See Comments)     Other reaction(s): Other (See Comments), Other (See Comments)  Drug induced NEUTROPENIA  Drug induced NEUTROPENIA  Drug induced NEUTROPENIA  Drug induced NEUTROPENIA  Drug induced NEUTROPENIA  Drug induced NEUTROPENIA     ??? Aspirin      Other reaction(s): Other (See Comments), Other (see comments), Other (See Comments)  Stomach bleeding  Stomach bleeding  Stomach bleeding  Stomach bleeding  Stomach bleeding  Stomach bleeding     ??? Ibuprofen Other (See Comments)     Other reaction(s): Other (See Comments), Other (see comments)  Stomach bleeding  Stomach bleeding  Stomach bleeding     ??? Mushroom Hives and Itching   ??? Shellfish Derived Hives and Itching   ??? Sulfa (Sulfonamide Antibiotics) Nausea And Vomiting, Hives and Itching   ??? Sulfasalazine Hives and Itching   ??? Acetaminophen Itching   ??? Ciprofloxacin      Other reaction(s): Other (See Comments), Other (see comments)  Made bottom raw  Made bottom raw  Made bottom raw  Made bottom raw     ??? Coconut Oil    ??? Metronidazole      Other reaction(s):  Other (See Comments), Other (see comments)  Made bottom raw  Made bottom raw  Made bottom raw  Made bottom raw     ??? Amoxicillin-Pot Clavulanate Itching     Has never had problems with amoxicillin/ penicillin    HAS TOLERATED ZOSYN WITH ZERO PROBLEMS  Has never had problems with amoxicillin/ penicillin    HAS TOLERATED ZOSYN WITH ZERO PROBLEMS  Has never had problems with amoxicillin/ penicillin    HAS TOLERATED ZOSYN WITH ZERO  PROBLEMS  Has never had problems with amoxicillin/ penicillin    HAS TOLERATED ZOSYN WITH ZERO PROBLEMS     ??? Coconut Flavor Itching   ??? Codeine Itching   ??? Egg Derived Nausea And Vomiting   ??? Iodine Itching   ??? Metrizamide Hives     Takes Benadryl 50mg  PO before receiving iodinated contrast  Takes Benadryl 50mg  PO before receiving iodinated contrast  Takes Benadryl 50mg  PO before receiving iodinated contrast     ??? Povidone-Iodine Itching       Karder Goodin

## 2021-12-09 NOTE — Unmapped (Signed)
Department of Internal Medicine  History & Physical    Patient: Veronica Winters  MRN: [id number]    Chief Complaint     Weakness, dizziness and SOB      History of Present Illness     Veronica Winters is a 71 y.o. 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    Patient reports that she has been short of breath for the past few months with exertion. She reports gaining weight during time as well as experienced weakness and dizziness. Of note, patient also reports chronic diarrhea for the last year with multiple watery stools a day. On 4/23, patient did not take any of her medications as she was feeling very sick. She had multiple episodes of diarrhea and began to feel more dyspneic and weak. Given concern of her family, she was brought to the hospital.     In the ED, patient was hypertensive but otherwise HDS. Labs largely unremarkable other than leukopenia, thrombocytopenia, and BNP of 201. CXR without acute cardiopulmonary disease. Patient received IV lasix 60, hydralazine 5 mg, gabapentin 300 mg, and tizanidine 2 mg. Admitted to medicine with concern for HF exacerbation    Review of Systems     Review of Systems   Constitutional: Positive for chills and malaise/fatigue. Negative for diaphoresis, fever and weight loss.   HENT: Negative for congestion, sinus pain and sore throat.    Eyes: Negative for blurred vision, double vision and photophobia.   Respiratory: Positive for shortness of breath. Negative for cough, hemoptysis, sputum production and wheezing.    Cardiovascular: Positive for orthopnea, leg swelling and PND. Negative for chest pain and palpitations.   Gastrointestinal: Positive for abdominal pain, diarrhea and nausea. Negative for blood in stool, constipation, heartburn, melena and vomiting.   Genitourinary: Negative for dysuria, frequency, hematuria and urgency.   Musculoskeletal: Negative  for back pain, joint pain, myalgias and neck pain.   Skin: Negative for itching and rash.   Neurological: Positive for dizziness and weakness. Negative for tingling and headaches.   Endo/Heme/Allergies: Negative for environmental allergies and polydipsia. Does not bruise/bleed easily.   Psychiatric/Behavioral: Negative for depression and hallucinations. The patient is not nervous/anxious.        Past Medical and Social History     Past Medical History:   Diagnosis Date   ??? Asthma    ??? CHF (congestive heart failure) (CMS-HCC)    ??? Hypercholesteremia    ??? Hypertension    ??? Renal disorder      No past surgical history on file.  No family history on file.  Social History     Tobacco Use   ??? Smoking status: Never   ??? Smokeless tobacco: Never   Substance Use Topics   ??? Alcohol use: Not Currently   ??? Drug use: Not Currently        Home Medications     Prior to Admission medications as of 10/04/21 2057   Medication Sig Taking?   albuterol (PROVENTIL) 2.5 mg /3 mL (0.083 %) nebulizer solution Inhale into the lungs.    amLODIPine (NORVASC) 5 MG tablet Take 5 mg by mouth daily.    atorvastatin (LIPITOR) 40 MG tablet Take 1 tablet by mouth daily.    buPROPion SR tablet 100mg , 12 HR Sustained-Release Take by mouth.    carvediloL (COREG) 25 MG tablet Take 1 tablet by mouth 2 times  a day.    cefUROXime (CEFTIN) 500 MG tablet Take 500 mg by mouth.    cholecalciferol, vitamin D3, 1000 units tablet Take 2 tablets by mouth daily.    furosemide (LASIX) 80 MG tablet Take by mouth.    gabapentin (NEURONTIN) 300 MG capsule Take by mouth.    hydrALAZINE (APRESOLINE) 50 MG tablet Take by mouth.    nirmatrelvir 150 mg - ritonavir 100 mg (PAXLOVID) 300 mg (150 mg x 2)-100 mg DsPk Take 3 tablet combination (2 x 150 mg nirmatrelvir tablets, 300 mg total) (1 x 100 mg ritonavir tablet) by mouth twice daily for 5 days.    omeprazole (PRILOSEC) 20 MG capsule Take by mouth.        Allergies   Allergen Reactions   ??? Vancomycin Other (See Comments)      Other reaction(s): Other (See Comments), Other (See Comments)  Drug induced NEUTROPENIA  Drug induced NEUTROPENIA  Drug induced NEUTROPENIA  Drug induced NEUTROPENIA  Drug induced NEUTROPENIA  Drug induced NEUTROPENIA     ??? Aspirin      Other reaction(s): Other (See Comments), Other (see comments), Other (See Comments)  Stomach bleeding  Stomach bleeding  Stomach bleeding  Stomach bleeding  Stomach bleeding  Stomach bleeding     ??? Ibuprofen Other (See Comments)     Other reaction(s): Other (See Comments), Other (see comments)  Stomach bleeding  Stomach bleeding  Stomach bleeding     ??? Mushroom Hives and Itching   ??? Shellfish Derived Hives and Itching   ??? Sulfa (Sulfonamide Antibiotics) Nausea And Vomiting, Hives and Itching   ??? Sulfasalazine Hives and Itching   ??? Acetaminophen Itching   ??? Ciprofloxacin      Other reaction(s): Other (See Comments), Other (see comments)  Made bottom raw  Made bottom raw  Made bottom raw  Made bottom raw     ??? Coconut Oil    ??? Metronidazole      Other reaction(s): Other (See Comments), Other (see comments)  Made bottom raw  Made bottom raw  Made bottom raw  Made bottom raw     ??? Amoxicillin-Pot Clavulanate Itching     Has never had problems with amoxicillin/ penicillin    HAS TOLERATED ZOSYN WITH ZERO PROBLEMS  Has never had problems with amoxicillin/ penicillin    HAS TOLERATED ZOSYN WITH ZERO PROBLEMS  Has never had problems with amoxicillin/ penicillin    HAS TOLERATED ZOSYN WITH ZERO PROBLEMS  Has never had problems with amoxicillin/ penicillin    HAS TOLERATED ZOSYN WITH ZERO PROBLEMS     ??? Coconut Flavor Itching   ??? Codeine Itching   ??? Egg Derived Nausea And Vomiting   ??? Iodine Itching   ??? Metrizamide Hives     Takes Benadryl 50mg  PO before receiving iodinated contrast  Takes Benadryl 50mg  PO before receiving iodinated contrast  Takes Benadryl 50mg  PO before receiving iodinated contrast     ??? Povidone-Iodine Itching     Physical Exam     Temp:  [97.8 ??F (36.6 ??C)]  97.8 ??F (36.6 ??C)  Heart Rate:  [64-65] 65  Resp:  [18-19] 19  BP: (167-197)/(82-115) 167/82  Physical Exam  Constitutional:       General: She is not in acute distress.     Appearance: She is ill-appearing. She is not toxic-appearing.   HENT:      Head: Normocephalic and atraumatic.      Nose: Nose normal. No congestion or rhinorrhea.  Mouth/Throat:      Mouth: Mucous membranes are moist.      Pharynx: No oropharyngeal exudate or posterior oropharyngeal erythema.   Eyes:      General: No scleral icterus.        Right eye: No discharge.         Left eye: No discharge.      Pupils: Pupils are equal, round, and reactive to light.   Neck:      Vascular: No carotid bruit.   Cardiovascular:      Rate and Rhythm: Normal rate and regular rhythm.      Pulses: Normal pulses.      Heart sounds: No murmur heard.    No friction rub. No gallop.   Pulmonary:      Effort: Pulmonary effort is normal. No respiratory distress.      Breath sounds: No wheezing or rales.   Abdominal:      General: There is no distension.      Palpations: Abdomen is soft.      Tenderness: There is abdominal tenderness. There is no guarding or rebound.   Musculoskeletal:         General: No swelling or deformity. Normal range of motion.      Cervical back: Normal range of motion. No rigidity.      Right lower leg: Edema present.      Left lower leg: Edema present.   Lymphadenopathy:      Cervical: No cervical adenopathy.   Skin:     General: Skin is warm.      Capillary Refill: Capillary refill takes less than 2 seconds.      Coloration: Skin is not jaundiced.      Findings: No bruising, lesion or rash.   Neurological:      General: No focal deficit present.      Mental Status: She is alert and oriented to person, place, and time.   Psychiatric:         Mood and Affect: Mood normal.           Diagnostic Studies     Labs: I have personally reviewed labs.    I have personally reviewed imaging studies.    Assessment & Plan     Akari A Garrow is a 71  y.o. 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    #HFpEF (60-65%)  #Concern for exacerbation  #SOB  #HTN   #Asthma  Has been short of breath for the past few months with exertion. She reports gaining weight during time as well as experienced weakness and dizziness. Labs largely unremarkable other than BNP of 201. CXR without acute cardiopulmonary disease. Per chart review, likely dry weight of 268 lbs. Patient received IV lasix 60 hydralazine 5 mg, and amlodipine 5mg .   - Will redose lasix based off of urine output at 0200  - TTE ordered  - Coreg 50 BID  - Valsartan 320 mg daily  - Daily weights  - Intake and output  - BNP q 72 hours  - PT/OT  - Home albuterol as needed    #MGUS  #Leuokpenia  #Thrombocytopenia  - Not on current medical management  - Following heme/onc outpatient    #Hx of CKD 3  Current cr of .97 around baseline  - Continue BP and CHF management.     #HLD  - Atorvastatin 40  mg daily    #GERD  - Pantoprazole 40 mg daily     #Back pain  #Neuropathy  - Tizanidine 2 mg q8h PRN  - Gabapentin 300 mg TID      No orders of the defined types were placed in this encounter.    DVT prophylaxis: Subcutaneous heparin (prophylaxis)  Code Status: No Order      Sampson Goon, MD  Internal Medicine, PGY-1  12/09/2021 9:12 PM

## 2021-12-09 NOTE — Unmapped (Signed)
[place]  DEPARTMENT OF INTERNAL MEDICINE  DAILY PROGRESS NOTE    Chief Complaint / Reason for Follow-Up     Veronica Winters is a 71 y.o. female on hospital day 1.  The principal reason for today's follow up visit is (HFpEF) heart failure with preserved ejection fraction (CMS-HCC).    Interval History / Subjective     Pt received IV Lasix 60, Hydralazine 5mg  in the ED with improvement in leg edema. However, she continues to complain of dyspnea as well as chronic watery diarrhea with abdominal pain. She states that she would not feel comfortable going home today as she lives alone and is not back to her baseline functioning.    Past medical, family, and social histories were reviewed as previously documented. Updates were made as necessary.    Review of Systems     Constitutional: positive for fatigue and malaise, negative for fevers, sweats and weight loss  Eyes: negative  Ears, nose, mouth, throat, and face: negative  Respiratory: positive for dyspnea on exertion, negative for cough, hemoptysis and sputum  Cardiovascular: positive for lower extremity edema and orthopnea, negative for chest pain and palpitations  Gastrointestinal: positive for abdominal pain and diarrhea, negative for constipation, melena, vomiting and hematochezia  Genitourinary:negative  Hematologic/lymphatic: negative  Musculoskeletal:negative  Neurological: negative except for weakness  Behavioral/Psych: negative  Endocrine: negative    Medications     Scheduled Meds:  ??? atorvastatin  40 mg Oral Daily 0900   ??? carvediloL  25 mg Oral BID   ??? cholecalciferol (vitamin D3)  2,000 Units Oral Daily 0900   ??? gabapentin  100 mg Oral TID   ??? heparin  5,000 Units Subcutaneous 3 times per day   ??? pantoprazole  40 mg Oral DAILY 0600   ??? pneumococcal 23 polyvalent vaccine  0.5 mL Intramuscular During Hospitalization   ??? valsartan  160 mg Oral Daily 0900     Continuous Infusions:  PRN Meds:albuterol, tiZANidine    Vital Signs     Temp:  [97.8 ??F (36.6 ??C)]  97.8 ??F (36.6 ??C)  Heart Rate:  [60-70] 70  Resp:  [18-22] 22  BP: (75-197)/(46-115) 90/49    Intake/Output Summary (Last 24 hours) at 12/10/2021 1053  Last data filed at 12/10/2021 0800  Gross per 24 hour   Intake 840 ml   Output 4000 ml   Net -3160 ml       Physical Exam     BP 90/49 (Patient Position: Lying)    Pulse 70    Temp 97.8 ??F (36.6 ??C) (Oral)    Resp 22    Ht 5' 5 (1.651 m)    Wt (!) 278 lb (126.1 kg)    SpO2 95%    BMI 46.26 kg/m??     General Appearance:    Alert, cooperative, ill appearing   Head:    Normocephalic, without obvious abnormality, atraumatic   Eyes:    PERRL, conjunctiva/corneas clear, EOM's intact   Nose:   Nares normal, mucosa normal, no drainage    or sinus tenderness   Throat:   Lips, mucosa, and tongue normal; teeth and gums normal   Neck:   Supple, symmetrical, trachea midline, no adenopathy;     thyroid:  no enlargement/tenderness/nodules   Lungs:     Clear to auscultation bilaterally, respirations unlabored   Chest Wall:    No tenderness or deformity    Heart:    Regular rate and rhythm, S1 and S2 normal, no  murmur, rub   or gallop   Abdomen:     Soft, tender to palpation diffusely with no guarding or rebound. Well healed surgical scars present   Extremities:   Extremities atraumatic, no cyanosis. Trace pitting edema bilaterally in calves and ankles   Pulses:   2+ and symmetric all extremities   Skin:   Skin color, texture, turgor normal, no rashes or lesions   Lymph nodes:   Cervical, supraclavicular, and axillary nodes normal   Neurologic:   Grossly intact with no focal deficits       Laboratory Data         Lab 12/10/21  0417 12/09/21  2122 12/09/21  1627   WBC 3.8 3.3* 3.4*   HEMOGLOBIN 13.5 13.9 14.2   HEMATOCRIT 40.3 42.4 43.3   MEAN CORPUSCULAR VOLUME 84.1 84.2 84.4   PLATELETS 105* 97* 101*         Lab 12/10/21  0417 12/09/21  2122 12/09/21  1627   SODIUM 144 144 142   POTASSIUM 3.8 3.3* 3.7   CHLORIDE 104 104 106   CO2 29 28 24    BUN 9 9 9    CREATININE 1.00 0.97 1.07    GLUCOSE 99 103* 94   CALCIUM 8.6 9.1 9.2   MAGNESIUM 1.9 2.0 1.8   PHOSPHORUS 3.8 2.6 2.8         Lab 12/09/21  2122   INR 1.0   PROTHROMBIN TIME 13.7         Lab 12/09/21  2122   ALT 8   AST 13   ALK PHOS 48   BILIRUBIN TOTAL 0.7   ALBUMIN 4.4         Lab 12/09/21  2151   COLOR, URINE Colorless*   CLARITY Clear   PROTEIN UA Negative   PH UA 7.0   SPECIFIC GRAVITY, URINE 1.007   GLUCOSE UA Negative   BLOOD UA Negative   LEUKOCYTES UA Negative   NITRITE UA Negative   BILIRUBIN UA Negative   UROBILINOGEN UA <2.0     Specialty labs:      Lab name 04/23/21  2246 04/24/21  0023   TROPONIN I <0.01 <0.01   NT PRO BNP 183*  --         Diagnostic Studies     Results for orders placed or performed during the hospital encounter of 12/09/21   ECG 12 lead (MUSE)    Narrative    Ventricular Rate:  67  BPM  Atrial Rate:  67  BPM  P-R Interval:  168  ms  QRS Duration:  60  ms  QT:  452  ms  QTc:  477  ms  P Axis:  -12  degrees  R Axis:  1  degrees  T Axis:  -77  degrees  Diagnosis Line:  NORMAL SINUS RHYTHM ^ CANNOT RULE OUT ANTERIOR INFARCT , AGE UNDETERMINED ^ ST SEGMENT CHANGE AND INFEROLATERAL T WAVE INVERSION ^ ABNORMAL ECG       Assessment & Plan     Veronica Winters is a 71 y.o. female on HD# 1 with (HFpEF) heart failure with preserved ejection fraction (CMS-HCC).  The medical issues being addressed in today's encounter are as follows:    #HFpEF: SOB for several months and weight gain ~10 lb from dry weight, and did not take her medications yesterday. CXR without acute cardiopulmonary disease and EKG is unchanged from priors.   -Net -3 L after 60 mg IV lasix  with improved edema on exam, hold further diuresis at this time  -Repeat standing weight this morning  -Coreg 25 BID and Valsartan 160 daily (half home dose due to episode of hypotension overnight)  -PT/OT  -Cardiac diet   -Repeat EKG  -Discussed possible etiology of her heart failure exacerbation. She reports good adherence to diet and is knowledgeable about her home  medications, though did miss her dose yesterday prior to admission. Given her history of MGUS as well as reported FHx of amyloidosis in her daughter, she could benefit from workup for infiltrative cardiomyopathy. She did have a negative fat pad biopsy 04/2021 which is highly specific, but less sensitive and does not necessarily rule out amyloidosis. Will assess with TTE and consider cardiac MRI in outpatient setting  -Possible discharge later today vs tomorrow with close cardiology follow up. Will discuss her case with her outpatient cardiologist Dr Harrison Mons.     #MGUS  -Follows heme onc with Dr. Gladstone Lighter. Bone biopsy on 12/2020  -Negative amyloidosis on fat pad biopsy 04/2021    #CKD3  -Cr is 1.0 around baseline     Active Hospital Problems    Diagnosis Date Noted   ??? (HFpEF) heart failure with preserved ejection fraction (CMS-HCC) [I50.30] 12/10/2021   ??? 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 Stage 3 chronic kidney disease (CMS-HCC) [N18.30] 06/16/2018   ??? Morbid obesity (CMS-HCC) [E66.01] 03/17/2015      Resolved Hospital Problems    Diagnosis Date Noted Date Resolved   ??? Acute heart failure with preserved ejection fraction (CMS-HCC) [I50.31] 12/10/2021 12/10/2021       Nutrition:   Diet Orders          Diet cardiac(low fat, salt, cholesterol) sodium 2 gm (low), fluid restricted 2000 ml starting at 04/23 2111          Code Status: Full Code    Signed:  Shantrice Rodenberg  12/10/2021, 10:53 AM

## 2021-12-09 NOTE — Unmapped (Signed)
[facility] ED Note    Date of service:  12/09/2021    Reason for Visit: Dizziness      Patient History     HPI:  Veronica Winters is a 71 y.o. female with PMHx of diastolic heart failure with preserved ejection fraction, last EF 60 to 65%  via TTE 10/12/2019,  who presents with a chief complaint of fatigue, generalized weakness and dizziness today.     Patient states she has been feeling worse over the past couple months.  She has had increased fatigue, generalized.  She is also had some softer stools with intermittent diarrhea accompanied by nausea and takes Zofran for the symptoms.  She has had increased weight gain and reports about 40 pounds over the past few months.  More recently she has had increased leg swelling over the past few days.  Decreased exercise tolerance.  Low appetite.  She has been taking her medications as prescribed, amlodipine, carvedilol, furosemide 40 mg daily.  She did not take her medications this morning because she was not feeling well.  She went to church and went to the bathroom and partially formed soft stool, nonbloody and felt lightheaded with this.  Her family was worried about her and brought her in to be evaluated.    On review of systems she notes decreased urine output despite continuing furosemide 40 mg daily over the past few days.  Past medical history also includes monoclonal gammopathy.     Aside from the above, patient denies any aggravating or alleviating factors or associated symptoms.       Past Medical History:   Diagnosis Date   ??? Asthma    ??? CHF (congestive heart failure) (CMS-HCC)    ??? Hypercholesteremia    ??? Hypertension    ??? Renal disorder        No past surgical history on file.    Veronica Winters  reports that she has never smoked. She has never used smokeless tobacco. She reports that she does not currently use alcohol. She reports that she does not currently use drugs.    Previous Medications    ALBUTEROL  (PROVENTIL) 2.5 MG /3 ML (0.083 %) NEBULIZER SOLUTION    Inhale into the lungs.    AMLODIPINE (NORVASC) 5 MG TABLET    Take 5 mg by mouth daily.    ATORVASTATIN (LIPITOR) 40 MG TABLET    Take 1 tablet by mouth daily.    BUPROPION SR TABLET 100MG , 12 HR SUSTAINED-RELEASE    Take by mouth.    CARVEDILOL (COREG) 25 MG TABLET    Take 1 tablet by mouth 2 times a day.    CEFUROXIME (CEFTIN) 500 MG TABLET    Take 500 mg by mouth.    CHOLECALCIFEROL, VITAMIN D3, 1000 UNITS TABLET    Take 2 tablets by mouth daily.    FUROSEMIDE (LASIX) 80 MG TABLET    Take by mouth.    GABAPENTIN (NEURONTIN) 300 MG CAPSULE    Take by mouth.    HYDRALAZINE (APRESOLINE) 50 MG TABLET    Take by mouth.    NIRMATRELVIR 150 MG - RITONAVIR 100 MG (PAXLOVID) 300 MG (150 MG X 2)-100 MG DSPK    Take 3 tablet combination (2 x 150 mg nirmatrelvir tablets, 300 mg total) (1 x 100 mg ritonavir tablet) by mouth twice daily for 5 days.    OMEPRAZOLE (PRILOSEC) 20 MG CAPSULE    Take by mouth.  Allergies:   Allergies as of 12/09/2021 - Fully Reviewed 12/09/2021   Allergen Reaction Noted   ??? Vancomycin Other (See Comments) 05/21/2017   ??? Aspirin  10/14/2011   ??? Ibuprofen Other (See Comments) 10/17/2015   ??? Mushroom Hives and Itching 01/09/2012   ??? Shellfish derived Hives and Itching 01/09/2012   ??? Sulfa (sulfonamide antibiotics) Nausea And Vomiting, Hives, and Itching 10/14/2011   ??? Sulfasalazine Hives and Itching 10/14/2011   ??? Acetaminophen Itching 03/27/2017   ??? Ciprofloxacin  05/18/2017   ??? Coconut oil  07/28/2019   ??? Metronidazole  05/18/2017   ??? Amoxicillin-pot clavulanate Itching 04/26/2016   ??? Coconut flavor Itching 04/20/2013   ??? Codeine Itching 10/14/2011   ??? Egg derived Nausea And Vomiting 01/09/2012   ??? Iodine Itching 10/14/2011   ??? Metrizamide Hives 10/14/2011   ??? Povidone-iodine Itching 10/14/2011       Review of Systems     ROS:  Pertinent positives and negatives included in the HPI. A comprehensive review of systems was performed and  negative except as noted previously    Physical Exam     ED Triage Vitals [12/09/21 1616]   Vital Signs Group      Temp 97.8 ??F (36.6 ??C)      Temp Source Oral      Heart Rate 64      Heart Rate Source Monitor      Resp 18      SpO2 100 %      BP (!) 175/100      MAP (mmHg) 116      BP Location Right arm      BP Method Automatic      Patient Position Sitting   SpO2 100 %   O2 Device None (Room air)       Constitutional:  71 y.o. female, elevated BMI, well developed; in no apparent distres  HEENT:  Normocephalic, atraumatic. Mucous membranes are moist.   Eyes: Anicteric, PERRL, EOMI   Neck:  Supple, Full ROM, trachea midline no appreciable JVD however she has a thick neck with redundant soft tissue.  Pulmonary:   Lungs clear to auscultation bilaterally with symmetric aeration. No Wheezes/Rales/Rhonchi.   Cardiac:  Regular rate and rhythm.  No murmurs/rubs/gallops.   Abdomen:  Soft, non-tender, non-distended.   Extremities: 2+ radial pulses.  2+ pitting edema bilateral lower extremities.  Skin:  No rashes or bruising.  Acanthosis nigricans visible over the forehead.  Neuro:  Alert and oriented x3. Speech normal. Moves UE and LE spontaneously and symmetrically  Psych:  Mood and affect appropriate for situation.     Diagnostic Studies     Labs:    Please see electronic medical record for any tests performed in the ED     Radiology:    Please see electronic medical record for any tests performed in the ED    EKG:    Indication: Weakness,   Rate: 73,   Rhythm: Normal sinus rhythm,   Intervals and Conduction: PR 150, QRS narrow 78, QTc 462, T wave inversion in 3,   Axis: Normal,   ST Segment Change: None  Comparison to prior EKG: similar to previous 10/04/21    Emergency Department Procedures     Procedures      ED Course and MDM     [name] is a 71 y.o. female with a history and presentation as described above in HPI.  The patient was evaluated by myself, the ED Attending  Physician, Dr. Williemae Natter, MD. All  management and disposition plans were discussed and agreed upon. Appropriate labs and diagnostic studies were reviewed as they were made available. Pertinent laboratory studies in medical decision making are listed below.     Upon presentation, the patient was hypertensive otherwise well appearing. Presentation most consistent with mild CHF exacerbation. Given hydralazine and home amlodipine with decreased BP. Given lasix dose here and walk testing. Weight 278, similar to baseline on recent office visits ~170 lb. No hypoxemia. CXR without evidence of significant pulmonary edema or pleural effusions. No signs or symptoms of pneumonia.       Medical Decision Making  Amount and/or Complexity of Data Reviewed  External Data Reviewed: labs, radiology, ECG and notes.  Labs: ordered.  Radiology: ordered.  ECG/medicine tests: ordered.      Risk  Prescription drug management.        At this time I am going off-service and will be signing out care of this patient to my colleague Dr. Jean Rosenthal for further care. My colleague's responsibilities will include:    - walk test after lasix  - reassessment and disposition, possible discharge home with lasix 40mg  BID x 3 days, potassium, and cardiology follow up     Consults:  None    Summary of Treatment in ED:    Medications   magnesium sulfate in D5W 100 mL 1 gram/100 mL IVPB 1 g (1 g Intravenous New Bag 12/09/21 1914)   amLODIPine (NORVASC) tablet 5 mg (5 mg Oral Given 12/09/21 1912)   furosemide (LASIX) injection 60 mg (60 mg Intravenous Given 12/09/21 1906)   hydrALAZINE (APRESOLINE) 20 mg/mL injection 5 mg (5 mg Intravenous Given 12/09/21 1906)     Impression     1. Fatigue, unspecified type    2. Primary hypertension           Chapman Moss, MD  PGY-2 Emergency Medicine    This note was dictated using voice-recognition software, which occasionally leads to inadvertent typographic errors.     Chapman Moss, MD  Resident  12/09/21 [phone]

## 2021-12-09 NOTE — Unmapped (Addendum)
Pt presents to ED via EMS after episode of chest fluttering and dizziness while walking to the bathroom at church. Pt reports not taking blood pressure medication this morning d/t being in a rush. Per squad, pt was 220sbp on arrival.

## 2021-12-09 NOTE — Unmapped (Addendum)
Stepahnie Bella KennedyA Hunn,  Here are your hospital discharge instructions:  --> You were hospitalized for shortness of breath. You were given water pills from the vein the first day and you peed the extra fluid and now you are doing better.   --> We did not make any medication changes: continue taking your usual home medications.  --> Medication changes:   1) You should take Lasix 40 daily, if your standing weight in the early morning is greater than 275. Take it every day as needed if you weight is increased. If your weight is still higher despite that after 2-3 days of taking the water pill, I would recommend to call your Cardiologist.    2) We started a new medication called Jardiance, you should take 10 mg every day.    3) We stopped the Amlodipine and the hydralazine, continue taking your Valsartan and the coreg the way you were taking it before.       --> Other Instructions:   1) For your chronic diarrhea For appointments, please call [phone].   2    Thank you, [facility]    No future appointments.    Recent Lab Values for you and your doctors:  Recent Labs     12/09/21  1627 12/09/21  2122 12/12/21  0701   BNP 201* 195* 66     Recent Labs     12/09/21  1627 12/09/21  2122 12/10/21  0417 12/11/21  0612   NA 142 144 144 143   K 3.7 3.3* 3.8 3.7   CL 106 104 104 107   CO2 24 28 29 27    BUN 9 9 9 10    CREATININE 1.07 0.97 1.00 1.00   GLUCOSE 94 103* 99 103*   CALCIUM 9.2 9.1 8.6 9.2   MG 1.8 2.0 1.9 1.9   PHOS 2.8 2.6 3.8 3.9   HGBA1C  --  5.8*  --   --      Recent Labs     12/09/21  1627 12/09/21  2122 12/10/21  0417 12/11/21  0612 12/12/21  0701   WBC 3.4* 3.3* 3.8 3.8 4.4   HGB 14.2 13.9 13.5 13.8 13.7   HCT 43.3 42.4 40.3 41.7 42.3   PLT 101* 97* 105* 97* 83*   INR  --  1.0  --   --   --    PROTIME  --  13.7  --   --   --      Recent Labs     12/09/21  2122   AST 13   ALT 8   ALKPHOS 48   BILITOT 0.7     Recent Labs     12/09/21  2122   CHOLTOT 211*   TRIG 96   HDL 59*   LDL 133     No results for input(s):  VITAMINB1, FOLATE in the last 72 hours.        Return to the Emergency Department if:     you have new, worse or continued chest pain  you experience shortness of breath, nausea or vomiting, chest pain with exertion, or sweating with chest pain  or if you have any other concerns    Please follow up with your physician or clinic within the next 2-3 days.

## 2021-12-10 LAB — COMPREHENSIVE METABOLIC PANEL
ALT: 8 U/L (ref 7–52)
AST: 13 U/L (ref 13–39)
Albumin: 4.4 g/dL (ref 3.5–5.7)
Alkaline Phosphatase: 48 U/L (ref 36–125)
Anion Gap: 12 mmol/L (ref 3–16)
BUN: 9 mg/dL (ref 7–25)
CO2: 28 mmol/L (ref 21–33)
Calcium: 9.1 mg/dL (ref 8.6–10.3)
Chloride: 104 mmol/L (ref 98–110)
Creatinine: 0.97 mg/dL (ref 0.60–1.30)
EGFR: 62
Glucose: 103 mg/dL (ref 70–100)
Osmolality, Calculated: 297 mOsm/kg (ref 278–305)
Potassium: 3.3 mmol/L (ref 3.5–5.3)
Sodium: 144 mmol/L (ref 133–146)
Total Bilirubin: 0.7 mg/dL (ref 0.0–1.5)
Total Protein: 7.6 g/dL (ref 6.4–8.9)

## 2021-12-10 LAB — URINALYSIS W/RFL TO MICROSCOPIC
Bilirubin, UA: NEGATIVE
Blood, UA: NEGATIVE
Glucose, UA: NEGATIVE mg/dL
Ketones, UA: NEGATIVE mg/dL
Leukocytes, UA: NEGATIVE
Nitrite, UA: NEGATIVE
Protein, UA: NEGATIVE mg/dL
Specific Gravity, UA: 1.007 (ref 1.005–1.035)
Urobilinogen, UA: <2 mg/dL (ref 0.2–1.9)
pH, UA: 7 (ref 5.0–8.0)

## 2021-12-10 LAB — HEMOGLOBIN A1C: Hemoglobin A1C: 5.8 % (ref 4.0–5.6)

## 2021-12-10 LAB — CBC
Hematocrit: 42.4 % (ref 35.0–45.0)
Hematocrit: 40.3 % (ref 35.0–45.0)
Hemoglobin: 13.9 g/dL (ref 11.7–15.5)
Hemoglobin: 13.5 g/dL (ref 11.7–15.5)
MCH: 27.7 pg (ref 27.0–33.0)
MCH: 28.2 pg (ref 27.0–33.0)
MCHC: 32.9 g/dL (ref 32.0–36.0)
MCHC: 33.5 g/dL (ref 32.0–36.0)
MCV: 84.2 fL (ref 80.0–100.0)
MCV: 84.1 fL (ref 80.0–100.0)
MPV: 9.7 fL (ref 7.5–11.5)
MPV: 10.5 fL (ref 7.5–11.5)
Platelets: 97 10*3/uL (ref 140–400)
Platelets: 105 10*3/uL (ref 140–400)
RBC: 5.03 10*6/uL (ref 3.80–5.10)
RBC: 4.79 10*6/uL (ref 3.80–5.10)
RDW: 15.4 % (ref 11.0–15.0)
RDW: 15.3 % (ref 11.0–15.0)
WBC: 3.3 10*3/uL (ref 3.8–10.8)
WBC: 3.8 10*3/uL (ref 3.8–10.8)

## 2021-12-10 LAB — DIFFERENTIAL
Atypical Lymphocytes Relative: 1 % (ref 0.0–9.0)
Basophils Absolute: 30 /uL (ref 0–200)
Basophils Absolute: 38 /uL (ref 0–200)
Basophils Relative: 0.9 % (ref 0.0–1.0)
Basophils Relative: 1 % (ref 0.0–1.0)
Eosinophils Absolute: 36 /uL (ref 15–500)
Eosinophils Absolute: 0 /uL (ref 15–500)
Eosinophils Relative: 1.1 % (ref 0.0–8.0)
Eosinophils Relative: 0 % (ref 0.0–8.0)
Lymphocytes Absolute: 1459 /uL (ref 850–3900)
Lymphocytes Absolute: 1528 /uL (ref 850–3900)
Lymphocytes Relative: 44.2 % (ref 15.0–45.0)
Lymphocytes Relative: 39.2 % (ref 15.0–45.0)
Monocytes Absolute: 950 /uL (ref 200–950)
Monocytes Absolute: 931 /uL (ref 200–950)
Monocytes Relative: 28.8 % (ref 0.0–12.0)
Monocytes Relative: 24.5 % (ref 0.0–12.0)
Neutrophils Absolute: 825 /uL (ref 1500–7800)
Neutrophils Absolute: 1303 /uL (ref 1500–7800)
Neutrophils Relative: 25 % (ref 40.0–80.0)
Neutrophils Relative: 34.3 % (ref 40.0–80.0)
Ovalocytes: 0
PLT Morphology: NORMAL
RBC Morphology: NORMAL
nRBC: 0 /100 WBC (ref 0–0)

## 2021-12-10 LAB — BASIC METABOLIC PANEL
Anion Gap: 11 mmol/L (ref 3–16)
BUN: 9 mg/dL (ref 7–25)
CO2: 29 mmol/L (ref 21–33)
Calcium: 8.6 mg/dL (ref 8.6–10.3)
Chloride: 104 mmol/L (ref 98–110)
Creatinine: 1 mg/dL (ref 0.60–1.30)
EGFR: 60
Glucose: 99 mg/dL (ref 70–100)
Osmolality, Calculated: 297 mOsm/kg (ref 278–305)
Potassium: 3.8 mmol/L (ref 3.5–5.3)
Sodium: 144 mmol/L (ref 133–146)

## 2021-12-10 LAB — MAGNESIUM
Magnesium: 2 mg/dL (ref 1.5–2.5)
Magnesium: 1.9 mg/dL (ref 1.5–2.5)

## 2021-12-10 LAB — PROTIME-INR
INR: 1 (ref 0.9–1.1)
Protime: 13.7 seconds (ref 12.1–15.1)

## 2021-12-10 LAB — LIPID PANEL
Cholesterol, Total: 211 mg/dL (ref 0–200)
HDL: 59 mg/dL (ref 60–92)
LDL Cholesterol: 133 mg/dL
Non-HDL Cholesterol, Calculated: 152 mg/dL (ref 0–129)
Triglycerides: 96 mg/dL (ref 10–149)

## 2021-12-10 LAB — B NATRIURETIC PEPTIDE: BNP: 195 pg/mL — ABNORMAL HIGH (ref 0–100)

## 2021-12-10 LAB — THYROID FUNCTION CASCADE: TSH: 3.08 u[IU]/mL (ref 0.45–4.12)

## 2021-12-10 LAB — PHOSPHORUS
Phosphorus: 2.6 mg/dL (ref 2.1–4.5)
Phosphorus: 3.8 mg/dL (ref 2.1–4.5)

## 2021-12-10 MED ORDER — valsartan (DIOVAN) tablet 160 mg
160 | Freq: Every day | ORAL | Status: AC
Start: 2021-12-10 — End: 2021-12-11
  Administered 2021-12-10 – 2021-12-11 (×2): 160 mg via ORAL

## 2021-12-10 MED ORDER — carvediloL (COREG) tablet 25 mg
12.5 | Freq: Two times a day (BID) | ORAL | Status: AC
Start: 2021-12-10 — End: 2021-12-12
  Administered 2021-12-10 – 2021-12-12 (×5): 25 mg via ORAL

## 2021-12-10 MED FILL — HEPARIN (PORCINE) 5,000 UNIT/ML INJECTION SOLUTION: 5000 5,000 unit/mL | INTRAMUSCULAR | Qty: 1

## 2021-12-10 MED FILL — POTASSIUM CHLORIDE ER 20 MEQ TABLET,EXTENDED RELEASE(PART/CRYST): 20 20 MEQ | ORAL | Qty: 2

## 2021-12-10 MED FILL — GABAPENTIN 100 MG CAPSULE: 100 100 MG | ORAL | Qty: 1

## 2021-12-10 MED FILL — CARVEDILOL 12.5 MG TABLET: 12.5 12.5 MG | ORAL | Qty: 2

## 2021-12-10 MED FILL — PANTOPRAZOLE 40 MG TABLET,DELAYED RELEASE: 40 40 MG | ORAL | Qty: 1

## 2021-12-10 MED FILL — CHOLECALCIFEROL (VITAMIN D3) 25 MCG (1,000 UNIT) TABLET: 1000 1000 units | ORAL | Qty: 2

## 2021-12-10 MED FILL — TIZANIDINE 2 MG TABLET: 2 2 MG | ORAL | Qty: 1

## 2021-12-10 MED FILL — CARVEDILOL 25 MG TABLET: 25 25 MG | ORAL | Qty: 2

## 2021-12-10 MED FILL — GABAPENTIN 300 MG CAPSULE: 300 300 MG | ORAL | Qty: 1

## 2021-12-10 MED FILL — VALSARTAN 320 MG TABLET: 320 320 MG | ORAL | Qty: 1

## 2021-12-10 MED FILL — ATORVASTATIN 40 MG TABLET: 40 40 MG | ORAL | Qty: 1

## 2021-12-10 MED FILL — VALSARTAN 80 MG TABLET: 80 80 MG | ORAL | Qty: 2

## 2021-12-10 MED FILL — VALSARTAN 160 MG TABLET: 160 160 MG | ORAL | Qty: 1

## 2021-12-10 NOTE — Telephone Encounter (Signed)
Dr. Margaretha Glassing called and inquired about speaking with Dr. Keenan Bachelor or his nurse regarding the patient. The patient was admitted into [facility] yesterday and he wants to discuss her care.  He wants a call back.  Phone number was given to [facility] to contact Dr. Keenan Bachelor.

## 2021-12-10 NOTE — Telephone Encounter (Signed)
Message given to Dr. Keenan Bachelor

## 2021-12-10 NOTE — Unmapped (Signed)
Bedside report completed with previous RN. Patient resting comfortably in bed. No acute distress noted. Denies any chest pain or SOB. VSS. Will continue to monitor

## 2021-12-10 NOTE — Unmapped (Signed)
Department of Internal Medicine  Daily Progress Note      Chief Complaint / Reason for Follow-Up     Brinnley A Swift is a 71 y.o. female on hospital day 0. The principal reason for today's follow up visit is (HFpEF) heart failure with preserved ejection fraction (CMS-HCC).    Interval History / Subjective/ROS      Denies Chest pain, subjective SOB, leg swelling b/l reduced compared to yesterday, continues to have mild abdominal pain     Unable to get standing weight   Telemetry reviewed short course of NSVT (less than 6 beats), asx and BP was normal during them       Medications     Scheduled Meds:  ??? atorvastatin  40 mg Oral Daily 0900   ??? carvediloL  25 mg Oral BID   ??? cholecalciferol (vitamin D3)  2,000 Units Oral Daily 0900   ??? gabapentin  100 mg Oral TID   ??? heparin  5,000 Units Subcutaneous 3 times per day   ??? pantoprazole  40 mg Oral DAILY 0600   ??? pneumococcal 23 polyvalent vaccine  0.5 mL Intramuscular During Hospitalization   ??? valsartan  160 mg Oral Daily 0900     Continuous Infusions:  PRN Meds:  albuterol, tiZANidine       Vital Signs     Temp:  [97.8 ??F (36.6 ??C)] 97.8 ??F (36.6 ??C)  Heart Rate:  [60-75] 75  Resp:  [18-22] 18  BP: (75-197)/(46-115) 100/58    Intake/Output Summary (Last 24 hours) at 12/10/2021 1426  Last data filed at 12/10/2021 0800  Gross per 24 hour   Intake 840 ml   Output 4000 ml   Net -3160 ml         Physical Exam     General Appearance:    Alert, cooperative, ill appearing   Head:    Normocephalic, without obvious abnormality, atraumatic       Nose:   Nares normal, mucosa normal, no drainage    or sinus tenderness   Throat:   Lips, mucosa, and tongue normal; teeth and gums normal   Neck:   Supple, symmetrical, trachea midline, no adenopathy;     thyroid:  no enlargement/tenderness/nodules   Lungs:     Clear to auscultation bilaterally, respirations unlabored   Chest Wall:    No tenderness or deformity    Heart:    Regular rate and rhythm, S1 and S2 normal, no murmur, rub   or  gallop   Abdomen:     Soft, tender to palpation diffusely with no guarding or rebound. Well healed surgical scars present   Extremities:   Extremities atraumatic, no cyanosis. Trace pitting edema bilaterally in calves and ankles   Pulses:   2+ and symmetric all extremities   Skin:   Skin color, texture, turgor normal, no rashes or lesions   Lymph nodes:   Cervical, supraclavicular, and axillary nodes normal   Neurologic:   Grossly intact with no focal deficits         Laboratory Data       CBC 12/10/2021                 \\    13.5   /         3.8  \\______/ 105                 /            \\                             /  40.3    \\ Differential 12/10/2021    N 34.3 L 39.2 M 24.5 E 0.0 B 1.0      Renal 12/10/2021       144       104         9    /    ___ __ _____ ______/ 99                                       \\     3.8         29       1.00   \\  Ca     8.6 (12/10/2021)  Mg     1.9 (12/10/2021)  Phos 3.8 (12/10/2021) Lipids    Lab Results   Component Value Date    CHOLTOT 211 (H) 12/09/2021    TRIG 96 12/09/2021    HDL 59 (L) 12/09/2021    LDL 133 12/09/2021      LFTs 12/09/2021               \\      0.7      /              \\    <0.2    /         13  \\______/  8               /            \\              /    48      \\ PT/INR/PTT - 12/09/2021    PT  13.7 INR  1.0 PTT  No results found for requested labs within last 16109 hours.                  Lab 12/09/21  2151   COLOR, URINE Colorless*   CLARITY Clear   SPECIFIC GRAVITY, URINE 1.007   PH UA 7.0   PROTEIN UA Negative   GLUCOSE UA Negative   KETONES UA Negative   BILIRUBIN UA Negative   BLOOD UA Negative   NITRITE UA Negative   UROBILINOGEN UA <2.0   LEUKOCYTES UA Negative             Lab Results   Component Value Date    NTPROBNP 183 (H) 04/23/2021       Lab Results   Component Value Date    TSH 3.08 12/09/2021             Diagnostic Studies     Reviewed       Assessment & Plan     Loreena A Aragones is a 71 y.o. female on HD# 0 with (HFpEF) heart failure with preserved  ejection fraction (CMS-HCC).  The medical issues being addressed in today's encounter are as follows:    Principal Problem:    (HFpEF) heart failure with preserved ejection fraction (CMS-HCC)  Active Problems:    Monoclonal gammopathy of unknown significance (MGUS)    Morbid obesity (CMS-HCC)    Resistant hypertension    Stage 3 chronic kidney disease (CMS-HCC)    NSVT (nonsustained ventricular tachycardia) (CMS-HCC)      Aleiyah A Skowron is a 71 y.o. female with a history of HFpEF (60-65%), presumptive Hx of asthma, HTN, CKD, HLD, GERD,  and MGUS presenting to the hospital with dyspnea on exertion, weakness, and dizziness.  ??  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   Has been short of breath for the past few months with exertion. She reports gaining weight during time as well as experienced weakness and dizziness. Labs largely unremarkable other than BNP of 201. CXR without acute cardiopulmonary disease. Per chart review, likely dry weight of 268 lbs. Patient received IV lasix 60 hydralazine 5 mg, and amlodipine 5mg .   3/22: Normal spect test, LVEF normal on that no ischemia or WMA   She was hypotensive during am and had a net -: 2.2 L with Lasix 60   Home regimen: Coreg 50 bid, Valsartan 320, Lasix 40  Unclear what provoked the exacerbation since she has chronic diarrhea. Combination of low qrs in ecg and NSVTs and exacerbation despite her being compliant with medicine could indicate infiltrative CMP(MGUS Hx +Family Hx of amyloidosis for daughter)  Diet could contribute.    Did not take medications yesterday 12/09/21 because she was feeling sick.   - TTE ordered, still pending   - Will hold off from further diuresis given medication and HoTN   - Cont half dose of home meds because of hypotension: Coreg 25 bid, Valsartan 160 and s/p Lasix 60 overnight  -Will try to coordinate work up with her OP cardiologist since she is close to discharge in terms of  acuity and most of her care is in care everywhere   - Need standing weight   - Intake and output  - PT/OT  - Home albuterol as needed  ??  #MGUS  #Leuokpenia  #Thrombocytopenia  #MGUS  -Follows heme onc with Dr. Gladstone Lighter. Bone biopsy on 12/2020  -Negative amyloidosis on fat pad biopsy 04/2021  - Not on current medical management  - Following heme/onc outpatient  ??  #Hx of CKD 3  Current cr of .97 around baseline  - Continue BP and CHF management.     #Diarrhea Chronic   Seems chronic per description. Had CTAP that was normal on 10/21   Colonoscopy on 2021: with Normal terminal ileum and normal mucosa (s/p sessile polyp removal). Negative infx work up on 2021 (Enteric panel+ C diff <0).     #HLD  - Atorvastatin 40 mg daily  ??  #GERD  - Pantoprazole 40 mg daily   ??  #Back pain  #Neuropathy  - Tizanidine 2 mg q8h PRN  - Gabapentin 300 mg TID  ??      Nutrition:  Diet Orders          Diet cardiac(low fat, salt, cholesterol) sodium 2 gm (low), fluid restricted 2000 ml starting at 04/23 2111          Code Status: Full Code    Signed:  Charlett Nose, MD  12/10/2021, 2:26 PM

## 2021-12-10 NOTE — Unmapped (Signed)
Plan of care note.  Discussed with patient and started

## 2021-12-10 NOTE — Unmapped (Signed)
Patient with small loose BM. Rolled with assist and cleaned. New perwick placed.

## 2021-12-10 NOTE — Unmapped (Signed)
Patient here for chest pain and dizziness that started yesterday. Hx of HTN, didn't take BP medication that morning. A&Ox4.

## 2021-12-10 NOTE — Unmapped (Signed)
[place]  DEPARTMENT OF INTERNAL MEDICINE  DAILY PROGRESS NOTE    Chief Complaint / Reason for Follow-Up     Veronica Winters is a 71 y.o. female on hospital day 0.  The principal reason for today's follow up visit is (HFpEF) heart failure with preserved ejection fraction (CMS-HCC).    Interval History / Subjective     Pt received IV Lasix 60, Hydralazine 5mg  in the ED with improvement in leg edema. However, she continues to complain of dyspnea as well as chronic watery diarrhea with abdominal pain. She states that she would not feel comfortable going home today as she lives alone and is not back to her baseline functioning.    This morning when we were seeing her, she had a few periods of NSVT observed on the monitor. These episodes were brief, <6 beats and she was asymptomatic with normal BP.    Past medical, family, and social histories were reviewed as previously documented. Updates were made as necessary.    Review of Systems     Constitutional: positive for fatigue and malaise, negative for fevers, sweats and weight loss  Eyes: negative  Ears, nose, mouth, throat, and face: negative  Respiratory: positive for dyspnea on exertion, negative for cough, hemoptysis and sputum  Cardiovascular: positive for lower extremity edema and orthopnea, negative for chest pain and palpitations  Gastrointestinal: positive for abdominal pain and diarrhea, negative for constipation, melena, vomiting and hematochezia  Genitourinary:negative  Hematologic/lymphatic: negative  Musculoskeletal:negative  Neurological: negative except for weakness  Behavioral/Psych: negative  Endocrine: negative    Medications     Scheduled Meds:  ??? atorvastatin  40 mg Oral Daily 0900   ??? carvediloL  25 mg Oral BID   ??? cholecalciferol (vitamin D3)  2,000 Units Oral Daily 0900   ??? gabapentin  100 mg Oral TID   ??? heparin  5,000 Units Subcutaneous 3 times per day   ??? pantoprazole  40 mg Oral DAILY 0600   ??? pneumococcal 23 polyvalent vaccine  0.5 mL  Intramuscular During Hospitalization   ??? valsartan  160 mg Oral Daily 0900     Continuous Infusions:  PRN Meds:albuterol, tiZANidine    Vital Signs     Temp:  [97.8 ??F (36.6 ??C)] 97.8 ??F (36.6 ??C)  Heart Rate:  [60-75] 75  Resp:  [18-22] 18  BP: (75-197)/(46-115) 100/58    Intake/Output Summary (Last 24 hours) at 12/10/2021 1359  Last data filed at 12/10/2021 0800  Gross per 24 hour   Intake 840 ml   Output 4000 ml   Net -3160 ml       Physical Exam     BP 100/58 (Patient Position: Lying)    Pulse 75    Temp 97.8 ??F (36.6 ??C) (Oral)    Resp 18    Ht 5' 5 (1.651 m)    Wt (!) 278 lb (126.1 kg)    SpO2 98%    BMI 46.26 kg/m??     General Appearance:    Alert, cooperative, ill appearing   Head:    Normocephalic, without obvious abnormality, atraumatic   Eyes:    PERRL, conjunctiva/corneas clear, EOM's intact   Neck:   Supple, symmetrical, trachea midline, no adenopathy;     thyroid:  no enlargement/tenderness/nodules   Lungs:     Clear to auscultation bilaterally, respirations unlabored   Chest Wall:    No tenderness or deformity    Heart:    Regular rate and rhythm, S1 and  S2 normal, no murmur, rub   or gallop   Abdomen:     Soft, tender to palpation diffusely with no guarding or rebound. Well healed surgical scars present   Extremities:   Extremities atraumatic, no cyanosis. Trace pitting edema bilaterally in calves and ankles   Pulses:   2+ and symmetric all extremities   Skin:   Skin color, texture, turgor normal, no rashes or lesions   Lymph nodes:   Cervical, supraclavicular, and axillary nodes normal   Neurologic:   Grossly intact with no focal deficits     Laboratory Data         Lab 12/10/21  0417 12/09/21  2122 12/09/21  1627   WBC 3.8 3.3* 3.4*   HEMOGLOBIN 13.5 13.9 14.2   HEMATOCRIT 40.3 42.4 43.3   MEAN CORPUSCULAR VOLUME 84.1 84.2 84.4   PLATELETS 105* 97* 101*         Lab 12/10/21  0417 12/09/21  2122 12/09/21  1627   SODIUM 144 144 142   POTASSIUM 3.8 3.3* 3.7   CHLORIDE 104 104 106   CO2 29 28 24    BUN 9  9 9    CREATININE 1.00 0.97 1.07   GLUCOSE 99 103* 94   CALCIUM 8.6 9.1 9.2   MAGNESIUM 1.9 2.0 1.8   PHOSPHORUS 3.8 2.6 2.8         Lab 12/09/21  2122   INR 1.0   PROTHROMBIN TIME 13.7         Lab 12/09/21  2122   ALT 8   AST 13   ALK PHOS 48   BILIRUBIN TOTAL 0.7   ALBUMIN 4.4         Lab 12/09/21  2151   COLOR, URINE Colorless*   CLARITY Clear   PROTEIN UA Negative   PH UA 7.0   SPECIFIC GRAVITY, URINE 1.007   GLUCOSE UA Negative   BLOOD UA Negative   LEUKOCYTES UA Negative   NITRITE UA Negative   BILIRUBIN UA Negative   UROBILINOGEN UA <2.0     Specialty labs:      Lab name 04/23/21  2246 04/24/21  0023   TROPONIN I <0.01 <0.01   NT PRO BNP 183*  --         Diagnostic Studies     Results for orders placed or performed during the hospital encounter of 12/09/21   ECG 12 lead (MUSE)    Narrative    Ventricular Rate:  67  BPM  Atrial Rate:  67  BPM  P-R Interval:  168  ms  QRS Duration:  60  ms  QT:  452  ms  QTc:  477  ms  P Axis:  -12  degrees  R Axis:  1  degrees  T Axis:  -77  degrees  Diagnosis Line:  NORMAL SINUS RHYTHM ^ CANNOT RULE OUT ANTERIOR INFARCT , AGE UNDETERMINED ^ ST SEGMENT CHANGE AND INFEROLATERAL T WAVE INVERSION ^ ABNORMAL ECG       Assessment & Plan     Veronica Winters is a 71 y.o. female on HD# 0 with (HFpEF) heart failure with preserved ejection fraction (CMS-HCC).  The medical issues being addressed in today's encounter are as follows:    #HFpEF, with concern for excacerbation  #SOB  #HTN  #Presumptive Asthma   SOB for several months and weight gain ~10 lb from dry weight, and did not take her medications yesterday. CXR without acute cardiopulmonary disease and EKG  is unchanged from priors.   -Net -3 L after 60 mg IV lasix with improved edema on exam, hold further diuresis at this time  -Repeat standing weight this morning  -Coreg 25 BID and Valsartan 160 daily (half dose of home regimen, she was briefly hypotensive overnight)  -PT/OT  -Cardiac diet   -Repeat EKG  -Discussed possible  etiology of her heart failure exacerbation. She reports good adherence to diet and is knowledgeable about her home medications, though did miss her dose yesterday prior to admission. She did have a few short courses of NSVT (<6 beats) when we were seeing her in the morning- she was asymptomatic with normal BP during these observed episodes. Given her history of MGUS as well as reported FHx of amyloidosis in her daughter, she could benefit from workup for infiltrative cardiomyopathy. She did have a negative fat pad biopsy 04/2021 which is highly specific, but less sensitive and does not necessarily rule out amyloidosis. Will assess with TTE and consider cardiac MRI in outpatient setting (she gets most of her care at an OSH)  -Possible discharge later today vs tomorrow with close cardiology follow up. Will discuss her case with her outpatient cardiologist Dr Harrison Mons.   -Home albuterol PRN    #Chronic Diarrhea:  -been going on for over a year per patient. Has had CTAPs and colonoscopies in 2021 which were normal (s/p 1 sessile polyp removal). Also had negative infection workup in 2021 with enteric bacterial panel and C diff which were normal.   -?IBS, patient states this is what she has been told but unclear how much additional workup has been performed  Would recommend imodium for symptom control    #MGUS  -Follows heme onc with Dr. Gladstone Lighter. Bone biopsy on 12/2020  -Negative amyloidosis on fat pad biopsy 04/2021    #CKD3   -Cr is 1.0 around baseline     #HLD:  -atorvastatin 40mg  daily    #Back pain  #Neuropathy  -Tizanidine 2mg  q8h prn  -gabapentin 300mg  TID    Active Hospital Problems    Diagnosis Date Noted   ??? (HFpEF) heart failure with preserved ejection fraction (CMS-HCC) [I50.30] 12/10/2021   ??? NSVT (nonsustained ventricular tachycardia) (CMS-HCC) [I47.29] 12/10/2021   ??? 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 Stage 3 chronic kidney disease  (CMS-HCC) [N18.30] 06/16/2018   ??? Morbid obesity (CMS-HCC) [E66.01] 03/17/2015      Resolved Hospital Problems    Diagnosis Date Noted Date Resolved   ??? Acute heart failure with preserved ejection fraction (CMS-HCC) [I50.31] 12/10/2021 12/10/2021       Nutrition:   Diet Orders          Diet cardiac(low fat, salt, cholesterol) sodium 2 gm (low), fluid restricted 2000 ml starting at 04/23 2111          Code Status: Full Code    Signed:  Zivah Mayr  12/10/2021, 1:59 PM

## 2021-12-10 NOTE — Unmapped (Signed)
Family at bedside.

## 2021-12-10 NOTE — Unmapped (Signed)
[facility]  Care Management/Social Work Assessment      Patient Information     Patient Name: Veronica Winters  MRN: [id number]  Hospital Day: 0  Inpatient/Observation: Observation  Admit Date: 12/09/2021  Admission Diagnosis: No admission diagnoses are documented for this encounter.  Attending provider: Berenda Morale, MD    PCP: PROVIDER NOT IN SYSTEM  Home Pharmacy:              [facility]  [address]  [address] [place] [address]  Phone: [phone]     [facility] - [place], [place] - [address]  [address]  [place] [address]  Phone: [phone]               Issues related to obtaining medications: None    Payor Information   Medical Insurance Coverage:  Payor: [employer] MDCR DUAL OPTIONS / Plan: Myra Rude MEDICARE / Product Type: *No Product type* /   Secondary Payor: None    Functional Assessment   Functional Assessment  Assessment Information Obtained From:: Patient  May We Obtain Collateral Information From Family, Friends and Neighbors?: Yes (Children, Siblings,)  How do you wish to be addressed?: Ms. Sheaffer  Current Mental Status: Awake, Oriented to Person, Oriented to Place, Oriented to Time, Oriented to Situation  Mental Status Prior to Admission: Unable to Assess  Activities of Daily Living: Partial Assistance Needed  Work History: Retired  Marital Status: Divorced  Number of children and their names: 3 Adult Children: Oretha Milch, Di Kindle  Relative Search Completed: No  Demographics Correct:: Yes  Discharge Destination: Home    Current Living Arrangements   Current Living Arrangements  Current Living Arrangements: Home  Type of Housing: Apartment  Who do you live with?: Alone    Community Services   Community Services  Community Services at Home: Home Health Care  Home Health Care Name/Phone #: [facility]  Home Health Services Types Prior to Admission: Skilled Nursing, Home Health Aide  Was any abuse reported by  patient?: No    Support Systems   Emergency contact: Extended Emergency Contact Information  Primary Emergency Contact: Lucio Edward  Work Phone: [phone]  Mobile Phone: [phone]  Relation: Sister  Secondary Emergency Contact: Lavone Nian  Mobile Phone: [phone]  Relation: Brother    Support Systems  Primary Caregiver: Self  Times of available support: Limited 24/7 hands on (add comment)  Marital Status: Divorced  Number of children and their names: 3 Adult Children: Oretha Milch, Di Kindle  Relative Search Completed: No  Demographics Correct:: Yes  Discharge Destination: Home  Next of Kin: Children  Next of Kin Relationship:  Daughter,  Son  Next of Kin Phone Number: 3 Adult Children: Oretha Milch [phone], Alda Ponder [phone], Heber Carolina [phone]  Assessment Information Obtained From:: Patient    Other Pertinent Information      Social worker (SW) met with patient at bedside to complete psychosocial assessment, introduce self and role of social worker in the hospital, and to verify demographic information.    Patient divorced and living alone in a(n) apartment. Patient reports having 3 aduld children. Patient denies any concerns for safety or abuse at residence. Patient reports they are semi-independent with ADL's. Patient reports they do have access to 24 hour supervision.    Patient reports that they are not employed. Patient denies any financial concerns/difficulties at this time. Patient is connected  to COA/ community resources to meet their needs. Patient does not have a HCPOA. Patient is aware legal next of kin is children, listed above.      Patient denies home oxygen, DME use, or dialysis. Patient denies history of skilled nursing facility admissions. Patient denies history of inpatient rehabilitation facility admissions. Patient endorses history of home health care services [facility]. Patient advised that they will not need assistance with  transportation upon discharge.     Advance Directives (For Healthcare)  Advance Directive: Patient does not have advance directive  No Advanced Directive: Patient would NOT like information about advanced directives, forgoing or with-drawing life-sustaining treatment, and withholding resuscitative services  Healthcare Agent Appointed: No  Pre-existing DNR/DNI Order: No  Patient Requests Assistance: Yes, will do independently    Discharge Plan     Met with patient to initiate discussion regarding discharge planning. Introduced self and role of case management/social work and provided Tour manager.       Anticipated Discharge Plan: Home, [facility]    Anticipated Discharge Date: 12/11/21    Anticipated Transportation: Family    Patient/Family aware and taking part in the discharge plan.  Patient/family educated that once post-acute care needs have been identified, a provider list applicable to the identified post-acute care needs as well as the insurance provider will be provided, and patient/family have the freedom to choose their provider(s); financial interest(s) are disclosed as appropriate.         Janett Billow, MSW  Phone Number: [phone]

## 2021-12-10 NOTE — Unmapped (Signed)
Bed: C40U  Expected date:   Expected time:   Means of arrival:   Comments:  D pod admit

## 2021-12-10 NOTE — Unmapped (Signed)
Problem: Knowledge Deficit  Goal: Patient/family/caregiver demonstrates understanding of disease process, treatment plan, medications, and discharge instructions  Description: Complete learning assessment and assess knowledge base.  Outcome: Progressing     Problem: Potential for Compromised Skin Integrity  Goal: Skin integrity is maintained or improved  Description: Assess and monitor skin integrity. Identify patients at risk for skin breakdown on admission and per policy. Collaborate with interdisciplinary team and initiate plans and interventions as needed.  Outcome: Progressing  Goal: Nutritional status is improving  Description: Monitor and assess patient for malnutrition (ex- brittle hair, bruises, dry skin, pale skin and conjunctiva, muscle wasting, smooth red tongue, and disorientation). Collaborate with interdisciplinary team and initiate plan and interventions as ordered.  Monitor patient's weight and dietary intake as ordered or per policy. Utilize nutrition screening tool and intervene per policy. Determine patient's food preferences and provide high-protein, high-caloric foods as appropriate.   Outcome: Progressing     Problem: Incontinence  Goal: Perineal skin integrity is maintained or improved  Description: Assess genitourinary system, perineal skin, labs (urinalysis), and history of incontinence to include past management, aggravating, and alleviating factors.  Collaborate with interdisciplinary team and initiate plans and interventions as needed.  Outcome: Progressing     Problem: Acute Pain  Description: Patient's pain progressing toward patient's stated pain goal  Goal: Patient displays improved well-being such as baseline levels for pulse, BP, respirations and relaxed muscle tone or body posture  Outcome: Progressing

## 2021-12-10 NOTE — Unmapped (Signed)
Ready and dirty bed assigned to 8224. Pt updated on plan of care including transfer and is agreeable. Receiving RN may call [phone] to consult ED RN with questions regarding patients care. Pt is leaving the department in stable condition with all personal items in possession.   Ordered medications that have been received from Pharmacy.  The patient does not have a patient monitor at bedside in the ED.  Pt was swabbed for COVID 19 and is considered risk.   Most recent vitals: BP 118/54    Pulse 67    Temp 97.3 ??F (36.3 ??C) (Oral)    Resp 19    Ht 5' 5 (1.651 m)    Wt (!) 278 lb (126.1 kg)    SpO2 99%    BMI 46.26 kg/m??     Waneta MartinsHELSEA Rush Salce ,RN

## 2021-12-10 NOTE — Unmapped (Signed)
Called her outpatient cardiologist's office, Dr Harrison Mons. Left VM for return call to discuss Veronica Winters medical care.    Called her outpatient nephrologist's office, Dr Melvyn Neth. Veronica Winters has an outpatient appt scheduled for tomorrow at 1pm. Veronica Winters won't make that appointment so we had them re-schedule.

## 2021-12-10 NOTE — Unmapped (Signed)
Per PT, patient had one watery BM in a bedpan. Patient cleaned up after BM.

## 2021-12-10 NOTE — Unmapped (Addendum)
Physical Therapy  Initial Assessment and Treatment     Name: Veronica Winters  DOB: [birth year]  Attending Physician: Berenda Morale, MD  Admission Diagnosis: No admission diagnoses are documented for this encounter.  Date: 12/10/2021  Room: D18/D18U  Reviewed Pertinent hospital course: Yes    Hospital Course PT/OT: 71 y/o F p/w weakness, dizziness, SOB in setting of HF exacerbation. CXR (-).  Relevant PMH : HFpEF (60-65%), asthma, HTN, CKD, HLD, GERD, and MGUS  Precautions: None  Activity Level: Activity as tolerated    Aide: None    Assessment  Assessment: Impaired Bed Mobility, Impaired Transfers, Impaired Gait, Impaired Strength, Impaired Balance, Impaired Safety Awareness, Impaired Stair Negotiation, Impaired Activity Tolerance, Deconditioning  Co-treatment performed: to integrate multiple skills simultaneously in order to challenge patient and advance progress, secondary to anticipated level of skilled assistance required to safely treat and mobilize patient  Prognosis: Good  Pt motivated to participate and tolerated mobility fairly well, however is limited d/t decreased activity tolerance and generalized weakness. Kierria required Min-Mod A for safe transfers and short bout of ambulation, needing seated rest break to recover. Pt is currently well below baseline level of mobility and requires continued therapy services as well as SNF level of care upon d/c from [facility] to decrease need for assistance with reduced risk for falls.    Recommendation  Recommendation: Short-term skilled PT  Equipment Recommended: Defer to facility to obtain         Outcome Measures  AM-PAC 6 Clicks Basic Mobility Inpatient Short Form: PT 6 Clicks Score: 18          Mobility Recommendations for Staff  Patient ability: Patient ambulates in room/ to bathroom  Assist needed: with 1 person assist  Equipment/ Precautions needed: Requires assistive device  Requires Assistive Device: Rolling walker    Home Living/Prior Function  Patient able to  provide accurate information at this time: Yes  Lives With: Alone  Assistance available: some (not 24 hour) assistance  Type of Home: Apartment  Home Entry: No steps to enter  Home Layout: One level  Bathroom Shower/Tub: Medical sales representative: Raised  Bathroom Equipment: Grab bars in shower;Shower chair  Home Equipment: Agricultural consultant  Prior Function  Functional Mobility: Modified Independent;with assistive device  Uses Assistive Device: Brewing technologist Help From: Home health aide (5 hrs/day, 5 days/week)  ADL Assistance: Intermittent assistance needed  Needs assistance with: Bathing;Dressing  IADL Assistance: Needs assistance  Needs assistance with: Meal preparation;Laundry;Cleaning;Driving;Shopping  Leisure Activities: watching tv     Pain  Pain Score: 5   Pain Location: Abdomen  Pain Descriptors: Aching  Pain Intervention(s): Ambulation/increased activity;Repositioned  Therapist reported pain to: RN aware    Vision  Vision/Perception  Overall Vision/ Perception: Impaired  Impairments: Basic Vision  Current Vision: Wears glasses only for reading       Cognition  Overall Cognitive Status: Within Functional Limits  Cognitive Assessment: Arousal/ Alertness;Orientation Level;Behavior;Following Commands;Safety Judgment;Insight  Arousal/Alertness: Alert  Orientation Level: Oriented X4  Behavior: Appropriate;Cooperative  Following Commands: Follows all commands and directions without difficulty  Safety Judgment: Good awareness of safety precautions  Insight: Demonstrated intact insight into limitation and abilities to complete ADL's safely    Neuromuscular  Overall Sensation: Impaired  Impairments: Light touch  Light Touch: RUE partial deficits;LUE partial deficits;RLE partial deficits;LLE partial deficits  Proprioception: No apparent deficits       Upper Extremity  UE Assessment: Defer to OT evaluation for formal assessment  Lower Extremity  Lower Extremity  LE Assessment: Strength WFL (at least  3+/5) as observed during functional activity          Functional Mobility  Bed Mobility   Supine to Sit: Minimal assistance;increased time to complete task;head of bed elevated;towards the right  Sit to Supine: Minimal assistance;increased time to complete task;head of bed flat;towards the left  Transfers  Sit to Stand: Minimal assistance;increased time to complete task;cues for hand placement (From EOB x2)  Stand to Sit: Contact guard assistance;increased time to complete task;cues for hand placement  Gait  Distance (in feet): 30'  Level of assistance: Minimal assistance;Moderate assistance;increased time to complete task  Assistive Device: Handheld assistance  Gait Characteristics: Unsteady;swing-through pattern;R decreased step length;L decreased step length;decreased cadence;No LOB;Increased trunk flexion (Mod A at bilat HHA with heavy forward flexion, cues for upright posture with minimal improvement)  Unable to progress further due to: Fatigue, SOB; SPO2 >90% on room air  Balance  Sitting - Static: Stand by Assistance  Sitting-Dynamic: Stand by assistance   Standing-Static: Minimal Assistance  Standing-Dynamic: Minimal Assistance;Moderate Assistance    Treatment provided during/after evaluation  Total treatment time (minutes): 8  Gait training: Postural cueing and sequencing  Therapeutic activities: Bed mobility, transfer training  Patient education: PT role, POC, d/c recommendations    Position after Therapy/Safety Handoff  Position after treatment and safety handoff  Position after therapy session: Bed in chair position  Details: RN notified;Call light/ needs within reach  Alarms: Bed  Alarms Status: Activated and Interfaced with call system    Goals  Collaborated with: Patient  Patient Stated Goal: to increase independence, to increase activity  Goals to be met by: 12/17/21  Patient will transition from supine to sit: Stand-By assistance  Patient will transition from sit to supine: Stand-By  assistance  Patient will transfer from sit to stand: Stand-By assistance, up to assistive device  Patient will ambulate: Stand-By assistance, with assistive device, distance (in feet)   Ambulation Asssistance Device: Rolling walker  Distance (in feet): 50'  Miscellaneous Goal #1: Pt will participate in formal balance assessment  Long-term goal to be met by: 12/24/21  Long Term Goal : Pt will tolerated stair assessment     Patient/Family Education  Educated patient on the role of physical therapy, goals, plan of care, importance of increased activity, discharge recommendations, transfer training, gait training, and safety and need for supervision during OOB activity and fall prevention strategies, including need for supervision/ assistance with OOB activity and use of call light; patient verbalized understanding and demonstrated understanding. Handout(s) issued: none.    Plan  Plan  Treatment/Interventions: LE strengthening/ROM, Endurance training, Equipment eval/education, Gait training, Compensatory technique education, Continued evaluation, Neuromuscular Reeducation, Stair Training, Therapeutic Activity, Therapeutic Exercise  PT Frequency: minimum 3x/week    The plan of care and recommendations assesses the patient's and/or caregiver's readiness, willingness, and ability to provide or support functional mobility and ADL tasks as needed upon discharge.        Time  Start Time: 0857  Stop Time: 0935  Time Calculation (min): 38 min    Charges   $PT Evaluation Mod Complex 30 Min: 1 Procedure  $Therapeutic Activity: 1 unit                 Problem List  Patient Active Problem List   Diagnosis   ??? Monoclonal gammopathy of unknown significance (MGUS)   ??? Morbid obesity (CMS-HCC)   ??? Obesity hypoventilation syndrome (CMS-HCC)   ???  Resistant hypertension   ??? S/P lumbar spinal fusion   ??? Stage 3 chronic kidney disease (CMS-HCC)   ??? (HFpEF) heart failure with preserved ejection fraction (CMS-HCC)      Past Medical  History  Past Medical History:   Diagnosis Date   ??? (HFpEF) heart failure with preserved ejection fraction (CMS-HCC) 12/10/2021   ??? Asthma    ??? CHF (congestive heart failure) (CMS-HCC)    ??? Hypercholesteremia    ??? Hypertension    ??? Renal disorder       Past Surgical History  No past surgical history on file.

## 2021-12-10 NOTE — Unmapped (Signed)
Patient sitting upright eating breakfast

## 2021-12-10 NOTE — Unmapped (Signed)
[facility]  Case Management/Social Work Department  Discharge Planning Screen    Screening Questions     Do you need help filling out medical forms: No  Is patient from anywhere other than a private residence? (shelter, SNF, LTC, IPR, LTAC, etc.): No  Do you have any services that come into the house to help you? (COA, private duty, HHC, etc): No  Do you have barriers getting to follow up appointment or obtaining prescriptions?: No  Have you been to the (ED/hospital) 4x times in the past 6 months? : No    Per screen, patient does not require full assessment at this time.     Liz Auburn Hert, MSW, LSW  Inpatient Social Worker  Blue Team  [phone]

## 2021-12-10 NOTE — Unmapped (Signed)
Patient resting comfortably in bed. A&Ox4, call light in reach.  VSS.  Denies any further needs at this time. Will continue to monitor

## 2021-12-10 NOTE — Unmapped (Signed)
PT/OT at bedside

## 2021-12-10 NOTE — Unmapped (Signed)
Admit team at bedside

## 2021-12-10 NOTE — Unmapped (Signed)
Occupational Therapy  Initial Assessment     Name: Veronica Winters  DOB: 19-Jul-1951  Attending Physician: Berenda Morale, MD  Admission Diagnosis: No admission diagnoses are documented for this encounter.  Date: 12/10/2021  Room: D18/D18U  Reviewed Pertinent hospital course: Yes    Hospital Course PT/OT: 71 y/o F p/w weakness, dizziness, SOB in setting of HF exacerbation. CXR (-).  Relevant PMH : HFpEF (60-65%), asthma, HTN, CKD, HLD, GERD, and MGUS  Precautions: None  Activity Level: Activity as tolerated    Aide: N/A    Recommendation  Recommendation: Short-term skilled OT  Equipment Recommendations: Patient already has needed DME    Assessment  Assessment: Decreased activity tolerance, Decreased Functional Mobility, Decreased ADL status, Decreased Balance, Decreased self-care transfers, Decreased IADLs        Co-treatment performed: to integrate multiple skills simultaneously in order to challenge patient and advance progress;secondary to anticipated level of skilled assistance required to safely treat and mobilize patient       Prognosis for OT goals: Good                      Pt agreeable to participate in OT evaluation on this date. Pt required Max A for ADLs and Min-Mod A to complete a short distance. Pt is limited by increased fatigue, increased pain, decreased balance, decreased strength, and decreased activity tolerance. Patient will continue to benefit from skilled acute OT services to increase functional independence.     Outcome Measures  AM-PAC 6 Clicks Daily Activity Inpatient Short Form: OT 6 Clicks Score: 17    Home Living/Prior Function  Patient able to provide accurate information at this time: Yes  Lives With: Alone  Assistance available: some (not 24 hour) assistance  Type of Home: Apartment  Home Entry: No steps to enter  Home Layout: One level  Bathroom Shower/Tub: Medical sales representative: Raised  Bathroom Equipment: Grab bars in shower, Paediatric nurse  Home Equipment: Clinical biochemist    Prior Function  Functional Mobility: Modified Independent, with assistive device  Uses Assistive Device: Brewing technologist Help From: Home health aide (5 hrs/day, 5 days/week)  ADL Assistance: Intermittent assistance needed  Needs assistance with: Bathing, Dressing  IADL Assistance: Needs assistance  Needs assistance with: Meal preparation, Laundry, Cleaning, Driving, Shopping  Leisure: Hobbies-yes (Comment)  Leisure Activities: watching tv  Currently recieving therapy services: No    Pain  Pain Score: 5   Pain Location: Abdomen  Pain Descriptors: Aching  Pain Intervention(s): Ambulation/increased activity;Repositioned  Therapist reported pain to: RN aware    Cognition  Overall Cognitive Status: Within Functional Limits  Cognitive Assessment: Arousal/ Alertness;Orientation Level;Behavior;Following Commands;Safety Judgment;Insight  Arousal/Alertness: Alert  Orientation Level: Oriented X4  Behavior: Appropriate;Cooperative  Following Commands: Follows all commands and directions without difficulty  Safety Judgment: Good awareness of safety precautions  Insight: Demonstrated intact insight into limitation and abilities to complete ADL's safely    Vision  Overall Vision/ Perception: Impaired  Impairments: Basic Vision  Current Vision: Wears glasses only for reading       Right Upper Extremity   Right UE ROM: Grossly WFL as observed during functional activities  Right UE Strength: Grossly WFL (at least 3+/5) as observed during functional activities  Right UE Muscle Tone: Normal  Right Hand Function: Grossly WFL as observed during functional activity         Left Upper Extremity  Left UE ROM: Grossly WFL as observed during functional activities  Left UE Strength: Grossly WFL (at least 3+/5) as observed during functional activities  Left UE Muscle Tone: Normal  Left UE Hand Function: Grossly WFL as observed during functional activites         Neuromuscular  Overall Sensation: Impaired  Impairments: Light  touch  Light Touch: RUE partial deficits;LUE partial deficits;RLE partial deficits;LLE partial deficits  Proprioception  Proprioception: No apparent deficits       Functional Mobility  Bed Mobility   Supine to Sit: Minimal assistance;head of bed elevated;towards the right  Sit to Supine: Minimal assistance;head of bed flat;towards the left  Transfers  Sit to Stand: Minimal assistance  Stand to Sit: Minimal assistance  Functional Mobility: Minimal assistance;Moderate assistance (Pt completed a short distance with Min-Mod A overall; pt limited by increased fatigue)  Balance  Sitting - Static: Supervision  Sitting-Dynamic: Stand by assistance   Standing-Static: Minimal Assistance  Standing-Dynamic: Minimal Assistance;Moderate Assistance    ADL  Feeding: Independent  Location Assessed Feeding: Seated in bed  Lower Body Dressing: Maximum assistnace  Lower Body Dressing Deficit: Don/doff brief  Location Assessed LE Dressing: Seated edge of bed;Standing edge of bed  Toileting: Maximum assistance  Toileting Deficit: Incontinent of bowel/bladder;Toileting hygiene  Location Assessed Toileting: Seated edge of bed;Standing edge of bed  Treatment provided during OT evaluation: Activities of Daily Living    Treatment provided during/after evaluation  Total treatment time (minutes): 10  Self Care/ ADL Training: Pt educated on strategies to complete toileting and lower body dressing to conserve energy and increase independence    Position after Treatment/Safety Handoff  Position after therapy session: Bed  Details: RN notified;Call light/ needs within reach  Alarms: Bed  Alarms Status: Unchanged from previous setting    Plan  Plan  Treatment Interventions: ADL retraining, Activity Tolerance training, IADL retraining, Energy Conservation, Functional transfer training, UE strengthening/ROM, Therapeutic Activity, Compensatory technique education, Excercise  OT Frequency: minimum 3x/week    The plan of care and recommendations assesses  the patient's and/or caregiver's readiness, willingness, and ability to provide or support functional mobility and ADL tasks as needed upon discharge.    Goals  Goals to be met in: 1 week  Patient stated goal: to go home  Patient will complete supine to sit in prep for ADLs: Stand-by assistance  Patient will complete functional chair transfer: Contact Guard assistance  Patient will complete toilet transfer: Contact Guard assistance  Patient will complete toileting: Minimal assistance  Patient will complete grooming task: Contact Guard assistance  Patient will complete upper body dressing: Supervision  Patient will complete lower body dressing: Minimal assistance  Pt Will tolerate completeing ADLs with pain less than 4/10: .  Long Term Goal : Pt will tolerate shower transfer assessment  Long term goal to be met in: 2 weeks  Collaborated with: Patient    Patient/Family Education  Educated patient on the role of occupational therapy, OT goals, OT plan of care, discharge recommendation, ADL training, functional mobility training and the importance of safety and fall prevention strategies including need for supervision/ assistance with OOB activity and use of call light. patient  verbalized understanding.    OT Time  Start Time: 1610  Stop Time: 0936  Time Calculation (min): 38 min    OT Charges  $OT Evaluation Mod Complex 45 Min: 1 Procedure  $Self Care/ADL/Home Management Training: 8-22 mins           Problem List  Patient Active Problem List   Diagnosis   ??? Monoclonal gammopathy  of unknown significance (MGUS)   ??? Morbid obesity (CMS-HCC)   ??? Obesity hypoventilation syndrome (CMS-HCC)   ??? Resistant hypertension   ??? S/P lumbar spinal fusion   ??? Stage 3 chronic kidney disease (CMS-HCC)   ??? (HFpEF) heart failure with preserved ejection fraction (CMS-HCC)      Past Medical History  Past Medical History:   Diagnosis Date   ??? (HFpEF) heart failure with preserved ejection fraction (CMS-HCC) 12/10/2021   ??? Asthma    ??? CHF  (congestive heart failure) (CMS-HCC)    ??? Hypercholesteremia    ??? Hypertension    ??? Renal disorder      Past Surgical History  No past surgical history on file.

## 2021-12-10 NOTE — Unmapped (Signed)
Patient sitting upright eating lunch

## 2021-12-10 NOTE — Unmapped (Signed)
[facility]  Case Management/Social Work Department  Progress Note    Patient Information     Patient Name: Veronica Winters  MRN: [id number]  Hospital day: 0  Inpatient/Observation:  Observation   Level of Care:  Floor  Admit date:  12/09/2021  Admission diagnosis: No admission diagnoses are documented for this encounter.    PMH:  has a past medical history of (HFpEF) heart failure with preserved ejection fraction (CMS-HCC) (12/10/2021), Asthma, CHF (congestive heart failure) (CMS-HCC), Hypercholesteremia, Hypertension, and Renal disorder.    PCP:  PROVIDER NOT IN SYSTEM    Home Pharmacy:    [facility]  [address]  [address] [place] [address]  Phone: [phone]     [facility] - [place], [place] - [address]  [address]  [place] [address]  Phone: [phone]         Medical Insurance Coverage:  Payor: Luther Redo MDCR DUAL OPTIONS / Plan: Myra Rude MEDICARE / Product Type: *No Product type* /     Other Pertinent Information     Social worker met with interdiscliplinary team for rounding/received report, and reviewed chart. Per team, patient is not medically ready for discharge. Patient declining SNF. Patient states she will have 24/7 supervision at home with friends/family.     Discharge Plan     Anticipated discharge plan:  Home, Home PT/OT/SN/Aide ([facility])     Anticipated discharge date:  12/11/21     CM/SW will continue to follow and remain available for discharge planning needs.      Janett Billow, MSW     Cell [phone]

## 2021-12-10 NOTE — Unmapped (Signed)
Diet tray ordered.

## 2021-12-10 NOTE — Unmapped (Signed)
Problem: Potential for Compromised Skin Integrity  Goal: Skin integrity is maintained or improved  Description: Assess and monitor skin integrity. Identify patients at risk for skin breakdown on admission and per policy. Collaborate with interdisciplinary team and initiate plans and interventions as needed.  Note: .

## 2021-12-11 ENCOUNTER — Observation Stay: Admit: 2021-12-11 | Payer: PRIVATE HEALTH INSURANCE

## 2021-12-11 ENCOUNTER — Encounter: Payer: PRIVATE HEALTH INSURANCE | Attending: Nephrology | Primary: Family Medicine

## 2021-12-11 LAB — RENAL FUNCTION PANEL W/EGFR
Albumin: 3.5 g/dL (ref 3.5–5.7)
Anion Gap: 9 mmol/L (ref 3–16)
BUN: 10 mg/dL (ref 7–25)
CO2: 27 mmol/L (ref 21–33)
Calcium: 9.2 mg/dL (ref 8.6–10.3)
Chloride: 107 mmol/L (ref 98–110)
Creatinine: 1 mg/dL (ref 0.60–1.30)
EGFR: 60
Glucose: 103 mg/dL — ABNORMAL HIGH (ref 70–100)
Osmolality, Calculated: 295 mosm/kg (ref 278–305)
Phosphorus: 3.9 mg/dL (ref 2.1–4.5)
Potassium: 3.7 mmol/L (ref 3.5–5.3)
Sodium: 143 mmol/L (ref 133–146)

## 2021-12-11 LAB — CBC
Hematocrit: 41.7 % (ref 35.0–45.0)
Hemoglobin: 13.8 g/dL (ref 11.7–15.5)
MCH: 28.1 pg (ref 27.0–33.0)
MCHC: 33.1 g/dL (ref 32.0–36.0)
MCV: 84.8 fL (ref 80.0–100.0)
MPV: 10.6 fL (ref 7.5–11.5)
Platelets: 97 10*3/uL (ref 140–400)
RBC: 4.92 10*6/uL (ref 3.80–5.10)
RDW: 15.9 % (ref 11.0–15.0)
WBC: 3.8 10*3/uL (ref 3.8–10.8)

## 2021-12-11 LAB — MAGNESIUM: Magnesium: 1.9 mg/dL (ref 1.5–2.5)

## 2021-12-11 MED ORDER — valsartan (DIOVAN) tablet 320 mg
320 | Freq: Every day | ORAL | Status: AC
Start: 2021-12-11 — End: 2021-12-12
  Administered 2021-12-12: 14:00:00 320 mg via ORAL

## 2021-12-11 MED ORDER — valsartan (DIOVAN) tablet 160 mg
160 | Freq: Once | ORAL | Status: AC
Start: 2021-12-11 — End: 2021-12-11
  Administered 2021-12-11: 16:00:00 160 mg via ORAL

## 2021-12-11 MED ORDER — perflutren (OPTISON) Susp 0.66 mg
0.22 | Freq: Once | INTRAVENOUS | Status: AC | PRN
Start: 2021-12-11 — End: 2021-12-11
  Administered 2021-12-11: 12:00:00 0.66 mg via INTRAVENOUS

## 2021-12-11 MED ORDER — polyethyleneglycolMIRALAXpacket17g
17 | Freq: Two times a day (BID) | ORAL | Status: AC
Start: 2021-12-11 — End: 2021-12-12

## 2021-12-11 MED FILL — TIZANIDINE 2 MG TABLET: 2 2 MG | ORAL | Qty: 1

## 2021-12-11 MED FILL — GABAPENTIN 100 MG CAPSULE: 100 100 MG | ORAL | Qty: 1

## 2021-12-11 MED FILL — POLYETHYLENE GLYCOL 3350 17 GRAM ORAL POWDER PACKET: 17 17 gram | ORAL | Qty: 1

## 2021-12-11 MED FILL — VALSARTAN 160 MG TABLET: 160 160 MG | ORAL | Qty: 1

## 2021-12-11 MED FILL — OPTISON 0.22 MG/ML INTRAVENOUS SUSPENSION: 0.22 0.22 mg/mL | INTRAVENOUS | Qty: 3

## 2021-12-11 MED FILL — HEPARIN (PORCINE) 5,000 UNIT/ML INJECTION SOLUTION: 5000 5,000 unit/mL | INTRAMUSCULAR | Qty: 1

## 2021-12-11 MED FILL — PANTOPRAZOLE 40 MG TABLET,DELAYED RELEASE: 40 40 MG | ORAL | Qty: 1

## 2021-12-11 MED FILL — ATORVASTATIN 20 MG TABLET: 20 20 MG | ORAL | Qty: 2

## 2021-12-11 MED FILL — CHOLECALCIFEROL (VITAMIN D3) 25 MCG (1,000 UNIT) TABLET: 1000 1000 units | ORAL | Qty: 2

## 2021-12-11 MED FILL — VALSARTAN 320 MG TABLET: 320 320 MG | ORAL | Qty: 1

## 2021-12-11 MED FILL — CARVEDILOL 12.5 MG TABLET: 12.5 12.5 MG | ORAL | Qty: 2

## 2021-12-11 NOTE — Unmapped (Signed)
ZOX:[id number]:Veronica Winters was advised by VW:[id number]:[name]     Spoke with CM Lanora ManisElizabeth who informed me that per primary team patient is not medically ready for discharge at this time.[facility] discharge [facility] [phone] documentation will need to be properly faxed over upon discharge.        Charm RingsAisha Elgin Carn  Care Management Assistant  [phone]

## 2021-12-11 NOTE — Unmapped (Signed)
Cardiac Rehab Consult Note  Phase I    Name: Veronica Winters  DOB:[birth year]  Attending Physician: Hyacinth Meeker, MD  Admitting Diagnosis: Primary hypertension [I10]  Acute on chronic diastolic congestive heart failure (CMS-HCC) [I50.33]  Fatigue, unspecified type [R53.83]  Date: 12/11/2021      Subjective:  Pt is agreeable to participate with Cardiac Rehab Phase I     Objective:  The Cardiovascular Inpatient Handbook and education were provided to the patient and patient's family. The following topics were addressed.    -Anatomy and function of the heart  -The physiology of myocardial ischemia  -Phase II Cardiac Rehab  -Rate of Perceived Exertion Scale (RPE)  -Cardiac risk factors including:   tobacco use, diet and cholesterol levels, hypertension, exercise,    diabetes mellitus, family history, body mass index, stress, alcohol use,    depression, street drugs    The patient and patient's family verbalized and demonstrated understanding of the education provided during the treatment today.      Assessment:  All questions from the patient and patient's family answered. Pt will benefit from continued rehab for maximal cardiovascular endurance and to return to prior level of functional mobility. Discussed CR shared the benefits and home exercises. Discussed low na/fat diet and ways to prepare meals and monitor fluid intake. Enc daily wt.       Plan:  Continue with outpatient Cardiac Rehab Phase II as directed by the physician.      Signed:    Randalyn Rhea  Exercise physiologist    Treatment Time: 25 min  Units Rendered: HC Phase I Cardiac Rehab        PMH:   Past Medical History:   Diagnosis Date   ??? (HFpEF) heart failure with preserved ejection fraction (CMS-HCC) 12/10/2021   ??? Asthma    ??? CHF (congestive heart failure) (CMS-HCC)    ??? Hypercholesteremia    ??? Hypertension    ??? Renal disorder      PSH: History reviewed. No pertinent surgical history.

## 2021-12-11 NOTE — Unmapped (Signed)
Consult ordered for CHF diet education.  Pt requested later visit due to chronic diarrhea today.  Will follow up as time allows.    Pearson Forster, NDTR  [phone]

## 2021-12-11 NOTE — Unmapped (Addendum)
[facility]  Case Management/Social Work Department  Progress Note    Patient Information     Patient Name: Veronica Winters  MRN: [id number]  Hospital day: 0  Inpatient/Observation:  Observation   Level of Care:  Floor  Admit date:  12/09/2021  Admission diagnosis: Primary hypertension [I10]  Acute on chronic diastolic congestive heart failure (CMS-HCC) [I50.33]  Fatigue, unspecified type [R53.83]    PMH:  has a past medical history of (HFpEF) heart failure with preserved ejection fraction (CMS-HCC) (12/10/2021), Asthma, CHF (congestive heart failure) (CMS-HCC), Hypercholesteremia, Hypertension, and Renal disorder.    PCP:  PROVIDER NOT IN SYSTEM    Home Pharmacy:    [facility]  [address]  [address] [place] [address]  Phone: [phone]     [facility] - [place], [place] - [address]  [address]  [place] [address]  Phone: [phone]         Medical Insurance Coverage:  Payor: Luther Redo MDCR DUAL OPTIONS / Plan: Myra Rude MEDICARE / Product Type: *No Product type* /     Other Pertinent Information     Social worker met with interdiscliplinary team for rounding/received report, and reviewed chart. Per team, patient is not medically ready for discharge. Patient is active with [facility] and will require PT/OT/SN/Aide upon discharge. Per PT/OT patient has needed DME.     Potential DB 11.     Discharge Plan     Anticipated discharge plan:  Home, HHC ([facility])     Anticipated discharge date:  12/12/21     CM/SW will continue to follow and remain available for discharge planning needs.      Janett Billow, MSW     Cell [phone]

## 2021-12-11 NOTE — Unmapped (Signed)
Department of Internal Medicine  Daily Progress Note      Chief Complaint / Reason for Follow-Up     Veronica Winters is a 71 y.o. female on hospital day 0. The principal reason for today's follow up visit is (HFpEF) heart failure with preserved ejection fraction (CMS-HCC).    Interval History / Subjective/ROS      Denies Chest pain, subjective SOB, denies leg swelling.   Still complaints for diarrhea as her most distressing symptoms. Seems that her dyspnea is better.     Medications     Scheduled Meds:  ??? atorvastatin  40 mg Oral Daily 0900   ??? carvediloL  25 mg Oral BID   ??? cholecalciferol (vitamin D3)  2,000 Units Oral Daily 0900   ??? gabapentin  100 mg Oral TID   ??? heparin  5,000 Units Subcutaneous 3 times per day   ??? pantoprazole  40 mg Oral DAILY 0600   ??? pneumococcal 23 polyvalent vaccine  0.5 mL Intramuscular During Hospitalization   ??? valsartan  160 mg Oral Daily 0900     Continuous Infusions:  PRN Meds:  albuterol, tiZANidine       Vital Signs     Temp:  [97.2 ??F (36.2 ??C)-98.2 ??F (36.8 ??C)] 97.8 ??F (36.6 ??C)  Heart Rate:  [62-75] 73  Resp:  [18-23] 18  BP: (90-162)/(49-65) 162/56    Intake/Output Summary (Last 24 hours) at 12/11/2021 0745  Last data filed at 12/11/2021 0453  Gross per 24 hour   Intake 480 ml   Output 1400 ml   Net -920 ml         Physical Exam     General Appearance:    Alert, cooperative, ill appearing   Head:    Normocephalic, without obvious abnormality, atraumatic       Nose:   Nares normal, mucosa normal, no drainage    or sinus tenderness   Throat:   Lips, mucosa, and tongue normal; teeth and gums normal   Neck:   Supple, symmetrical, trachea midline, no adenopathy;     thyroid:  no enlargement/tenderness/nodules   Lungs:     Clear to auscultation bilaterally, respirations unlabored   Chest Wall:    No tenderness or deformity    Heart:    Regular rate and rhythm, S1 and S2 normal, no murmur, rub   or gallop   Abdomen:     Soft, tender to palpation diffusely with no guarding or rebound.  Well healed surgical scars present   Extremities:   Extremities atraumatic, no cyanosis. Trace pitting edema bilaterally in calves and ankles   Pulses:   2+ and symmetric all extremities   Skin:   Skin color, texture, turgor normal, no rashes or lesions   Lymph nodes:   Cervical, supraclavicular, and axillary nodes normal   Neurologic:   Grossly intact with no focal deficits         Laboratory Data       CBC 12/11/2021                 \\    13.8   /         3.8  \\______/ 97                 /            \\                             /  41.7    \\ Differential 12/10/2021    N 34.3 L 39.2 M 24.5 E 0.0 B 1.0      Renal 12/11/2021       143       107         10    /    ___ __ _____ ______/ 103                                       \\     3.7         27       1.00   \\  Ca     9.2 (12/11/2021)  Mg     1.9 (12/11/2021)  Phos 3.9 (12/11/2021) Lipids    Lab Results   Component Value Date    CHOLTOT 211 (H) 12/09/2021    TRIG 96 12/09/2021    HDL 59 (L) 12/09/2021    LDL 133 12/09/2021      LFTs 12/09/2021               \\      0.7      /              \\    <0.2    /         13  \\______/  8               /            \\              /    48      \\ PT/INR/PTT - 12/09/2021    PT  13.7 INR  1.0 PTT  No results found for requested labs within last 09811 hours.                  Lab 12/09/21  2151   COLOR, URINE Colorless*   CLARITY Clear   SPECIFIC GRAVITY, URINE 1.007   PH UA 7.0   PROTEIN UA Negative   GLUCOSE UA Negative   KETONES UA Negative   BILIRUBIN UA Negative   BLOOD UA Negative   NITRITE UA Negative   UROBILINOGEN UA <2.0   LEUKOCYTES UA Negative             Lab Results   Component Value Date    NTPROBNP 183 (H) 04/23/2021       Lab Results   Component Value Date    TSH 3.08 12/09/2021             Diagnostic Studies     Reviewed       Assessment & Plan     Veronica Winters is a 71 y.o. female on HD# 0 with (HFpEF) heart failure with preserved ejection fraction (CMS-HCC).  The medical issues being addressed in today's encounter are as  follows:    Principal Problem:    (HFpEF) heart failure with preserved ejection fraction (CMS-HCC)  Active Problems:    Monoclonal gammopathy of unknown significance (MGUS)    Morbid obesity (CMS-HCC)    Resistant hypertension    Stage 3 chronic kidney disease (CMS-HCC)    NSVT (nonsustained ventricular tachycardia) (CMS-HCC)      Veronica Winters is a 71 y.o. female with a history of HFpEF (60-65%), presumptive Hx of asthma, HTN, CKD, HLD, GERD,  and MGUS presenting to the hospital with dyspnea on exertion, weakness, and dizziness.  ??  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   Has been short of breath for the past few months with exertion. She reports gaining weight during time as well as experienced weakness and dizziness. Labs largely unremarkable other than BNP of 201. CXR without acute cardiopulmonary disease. Per chart review, likely dry weight of 268 lbs. Patient received IV lasix 60 hydralazine 5 mg, and amlodipine 5mg .   3/22: Normal spect test, LVEF normal on that no ischemia or WMA   She was hypotensive during am and had a net -: 2.2 L with Lasix 60   Home regimen: Coreg 50 bid, Valsartan 320, Lasix 40  Unclear what provoked the exacerbation since she has chronic diarrhea. Combination of low qrs in ecg and NSVTs and exacerbation despite her being compliant with medicine could indicate infiltrative CMP(MGUS Hx +Family Hx of amyloidosis for daughter)  Diet could contribute.    Did not take medications yesterday 12/09/21 because she was feeling sick.   - TTE ordered mildly reduced EF 50%, collapsable IVC so euvolemic overall  - Increase Valsartan to   -Will try to coordinate work up with her OP cardiologist since she is close to discharge in terms of acuity and most of her care is in care everywhere   -Increase to Valsartan 320 which is her home dose   - Home albuterol as needed  ??  #MGUS  #Leuokpenia  #Thrombocytopenia  #MGUS  -Follows heme onc  with Dr. Gladstone Lighter. Bone biopsy on 12/2020  -Negative amyloidosis on fat pad biopsy 04/2021  - Not on current medical management  - Following heme/onc outpatient  ??  #Hx of CKD 3  Current cr of .97 around baseline  - Continue BP and CHF management.     #Diarrhea Chronic   Seems chronic per description. Had CTAP that was normal on 10/21   Colonoscopy on 2021: with Normal terminal ileum and normal mucosa (s/p sessile polyp removal). Negative infx work up on 2021 (Enteric panel+ C diff <0).   -kub today, concern for overflow, will start Miralax   -She needs OP GI follow up     #HLD  - Atorvastatin 40 mg daily  ??  #GERD  - Pantoprazole 40 mg daily   ??  #Back pain  #Neuropathy  - Tizanidine 2 mg q8h PRN  - Gabapentin 300 mg TID  ??      Nutrition:  Diet Orders          Diet cardiac(low fat, salt, cholesterol) sodium 2 gm (low), fluid restricted 2000 ml starting at 04/23 2111          Code Status: Full Code    Signed:  Charlett Nose, MD  12/11/2021, 7:45 AM

## 2021-12-11 NOTE — Unmapped (Signed)
[facility]           Advanced Heart Failure Program         Met with patient to discuss HF education/needs assessment. Patient alone at bedside for education.    Has patient been admitted in last 12 months for HF: No    Patient/family familiarity with HF education: Patient stated that she is familiar with HF since she has had it for some time now. New ECHO shows mildly reduced EF. Patient stated that she has a local cardiologist and nephrologist whom manage her HF. Patient stated that she has managed to stay out of the hospital. Patient expressed frustration because she follows HF management interventions yet her heart function still decreased.     The following topics were discussed:  -Heart pumping mechanics & how HF affects this mechanism  -Daily morning weights-patient reports weighing herself daily.  -Medications & compliance  -Low sodium 2g (2,000mg ) per day diet-paitne treports that she is not perfect but does pretty well with a low sodium diet  -When to call provider      The rationale behind performing each measure was also reviewed with patient. Written HF education, including AHA My Heart Failure Action plan was provided to patient/family.    Patient/family verbalized understanding via teach-back method.    Patient's heart failure goal for this admission: Patient stated that she should've called her cardiologist earlier. Patient will call at the first onset of symptoms.     Does patient need support services?:Patient declined    Will continue to follow for education/needs assessment.  Thank you for this consult.   Time spent in educational discussion with pt: 41 minutes.    Larey Brick, RN, BSN  Heart Failure Coordinator  c- [phone]

## 2021-12-11 NOTE — Unmapped (Signed)
Occupational Therapy   Reason Patient Not Seen     Name: Veronica HippoDebborrah A Cooks  DOB: September 28, 1950  Attending Physician: Hyacinth MeekerMichael Colin Binder, MD  Admission Diagnosis: Primary hypertension [I10]  Acute on chronic diastolic congestive heart failure (CMS-HCC) [I50.33]  Fatigue, unspecified type [R53.83]  Date: 12/11/2021  Precautions: Precautions: None    Unable to see patient due to: Pt meeting with medical staff for prolonged visit on OT attempt.  Therapy will follow up with pt as pt is appropriate and schedule permits    Time Attempted: 1440

## 2021-12-11 NOTE — Unmapped (Signed)
[facility]  DEPARTMENT OF INTERNAL MEDICINE  DAILY PROGRESS NOTE    Chief Complaint / Reason for Follow-Up     Veronica Winters is a 71 y.o. female on hospital day 2.  The principal reason for today's follow up visit is (HFpEF) heart failure with preserved ejection fraction (CMS-HCC).    Interval History / Subjective     Pt continues to feel better and less fatigued and dyspneic today, but still complains of chronic watery diarrhea with abdominal pain. She reports she had 5 episodes of diarrhea in the past day.    She has been working with PT but declines SNF due to negative experiences in the past. She lives alone but states she feels she has good support from her sister and can be independent with her ADLs.    Past medical, family, and social histories were reviewed as previously documented. Updates were made as necessary.    Review of Systems     Constitutional: positive for fatigue, negative for fevers, sweats and weight loss  Eyes: negative  Ears, nose, mouth, throat, and face: negative  Respiratory: negative for cough, hemoptysis and sputum  Cardiovascular: negative for chest pain and palpitations  Gastrointestinal: positive for abdominal pain and diarrhea, negative for constipation, melena, vomiting and hematochezia  Genitourinary:negative  Hematologic/lymphatic: negative  Musculoskeletal:negative  Neurological: negative except for weakness  Behavioral/Psych: negative  Endocrine: negative    Medications     Scheduled Meds:  ??? atorvastatin  40 mg Oral Daily 0900   ??? carvediloL  25 mg Oral BID   ??? cholecalciferol (vitamin D3)  2,000 Units Oral Daily 0900   ??? gabapentin  100 mg Oral TID   ??? heparin  5,000 Units Subcutaneous 3 times per day   ??? pneumococcal 23 polyvalent vaccine  0.5 mL Intramuscular During Hospitalization   ??? valsartan  160 mg Oral Once   ??? [START ON 12/12/2021] valsartan  320 mg Oral Daily 0900     Continuous Infusions:  PRN Meds:albuterol, tiZANidine    Vital Signs     Temp:  [97.2 ??F (36.2  ??C)-98.2 ??F (36.8 ??C)] 98.1 ??F (36.7 ??C)  Heart Rate:  [62-75] 69  Resp:  [18-23] 18  BP: (100-162)/(54-87) 136/87    Intake/Output Summary (Last 24 hours) at 12/11/2021 1032  Last data filed at 12/11/2021 0453  Gross per 24 hour   Intake 240 ml   Output 200 ml   Net 40 ml       Physical Exam     BP 136/87 (BP Location: Right arm, Patient Position: Lying)    Pulse 69    Temp 98.1 ??F (36.7 ??C) (Oral)    Resp 18    Ht 5' 5 (1.651 m)    Wt (!) 279 lb 12.2 oz (126.9 kg)    SpO2 99%    BMI 46.56 kg/m??     General Appearance:    Alert, cooperative, ill appearing   Head:    Normocephalic, without obvious abnormality, atraumatic   Neck:   Supple, symmetrical, trachea midline, no adenopathy;     thyroid:  no enlargement/tenderness/nodules   Lungs:     Clear to auscultation bilaterally, respirations unlabored   Chest Wall:    No tenderness or deformity    Heart:    Regular rate and rhythm, S1 and S2 normal, no murmur, rub   or gallop   Abdomen:     Soft, tender to palpation diffusely with no guarding or rebound. Well healed surgical  scars present   Extremities:   Extremities atraumatic, no cyanosis. Trace pitting edema bilaterally in calves and ankles, tender to palpation bilaterally. No asymmetry or erythema.   Pulses:   2+ and symmetric all extremities   Skin:   Skin color, texture, turgor normal, no rashes or lesions   Lymph nodes:   Cervical, supraclavicular, and axillary nodes normal   Neurologic:   Grossly intact with no focal deficits       Laboratory Data         Lab 12/11/21  0612 12/10/21  0417 12/09/21  2122 12/09/21  1627   WBC 3.8 3.8 3.3* 3.4*   HEMOGLOBIN 13.8 13.5 13.9 14.2   HEMATOCRIT 41.7 40.3 42.4 43.3   MEAN CORPUSCULAR VOLUME 84.8 84.1 84.2 84.4   PLATELETS 97* 105* 97* 101*         Lab 12/11/21  0612 12/10/21  0417 12/09/21  2122 12/09/21  1627   SODIUM 143 144 144 142   POTASSIUM 3.7 3.8 3.3* 3.7   CHLORIDE 107 104 104 106   CO2 27 29 28 24    BUN 10 9 9 9    CREATININE 1.00 1.00 0.97 1.07   GLUCOSE 103*  99 103* 94   CALCIUM 9.2 8.6 9.1 9.2   MAGNESIUM 1.9 1.9 2.0 1.8   PHOSPHORUS 3.9 3.8 2.6 2.8         Lab 12/09/21  2122   INR 1.0   PROTHROMBIN TIME 13.7         Lab 12/11/21  0612 12/09/21  2122   ALT  --  8   AST  --  13   ALK PHOS  --  48   BILIRUBIN TOTAL  --  0.7   ALBUMIN  --  4.4   ALBUMINKID 3.5  --          Lab 12/09/21  2151   COLOR, URINE Colorless*   CLARITY Clear   PROTEIN UA Negative   PH UA 7.0   SPECIFIC GRAVITY, URINE 1.007   GLUCOSE UA Negative   BLOOD UA Negative   LEUKOCYTES UA Negative   NITRITE UA Negative   BILIRUBIN UA Negative   UROBILINOGEN UA <2.0     Diagnostic Studies     Reviewed.     12/11/21 TTE:  Impressions:   - Poor image quality limits interpretation. No prior for comparison.   - Consider alternate imaging modality(cardiac MRI) if clinically indicated.     12/11/21 KUB:  IMPRESSION:   ??   1. ??No plain radiographic evidence of acute abnormality.     Results for orders placed or performed during the hospital encounter of 12/09/21   ECG 12 lead (MUSE)    Narrative    Ventricular Rate:  67  BPM  Atrial Rate:  67  BPM  P-R Interval:  168  ms  QRS Duration:  60  ms  QT:  452  ms  QTc:  477  ms  P Axis:  -12  degrees  R Axis:  1  degrees  T Axis:  -77  degrees  Diagnosis Line:  NORMAL SINUS RHYTHM ^ CANNOT RULE OUT ANTERIOR INFARCT , AGE UNDETERMINED ^ ST SEGMENT CHANGE AND INFEROLATERAL T WAVE INVERSION ^ ABNORMAL ECG       Assessment & Plan     Veronica Winters is a 71 y.o. female on HD# 0 with (HFpEF) heart failure with preserved ejection fraction (CMS-HCC).  The medical issues being addressed in today's  encounter are as follows:    #HFpEF, with concern for excacerbation  #SOB  #HTN  #Presumptive Asthma   SOB for several months and weight gain ~10 lb from dry weight, and did not take her medications on the day of admission. CXR without acute cardiopulmonary disease and EKG is unchanged from priors.   -Improved edema after 60 mg IV lasix in the ED 4/23, holding further diuresis at this  time  -Repeat standing weight this morning   -BP rising today, will increase Valsartan 320 daily (this is her home dose). Continue Coreg 25 BID (half home dose)   -PT/OT  -Cardiac diet   -Discussed possible etiology of her heart failure exacerbation. She reports good adherence to diet and is knowledgeable about her home medications, though did miss her dose yesterday prior to admission. She did have a few short courses of NSVT (<6 beats) when we were seeing her 4/24- she was asymptomatic with normal BP during these observed episodes. Given her history of MGUS as well as reported FHx of amyloidosis in her daughter, she could benefit from workup for infiltrative cardiomyopathy. She did have a negative fat pad biopsy 04/2021 which is highly specific, but less sensitive and does not necessarily rule out amyloidosis. TTE today suggests reduced systolic function to 45-50% (prior Echo in 2021 shows EF 60-65%) but is limited by poor image quality. Consider cardiac MRI in outpatient setting (she gets most of her care at an OSH)  -Will discuss her case with her outpatient cardiologist Dr Harrison Mons.   -Home albuterol PRN  -Dispo: pending TTE workup. From a clinical standpoint she continues to improve and is approaching discharge readiness  ??  #Chronic Diarrhea:  -been going on for over a year per patient. Has had CTAPs and colonoscopies in 2021 which were normal (s/p 1 sessile polyp removal). Also had negative infection workup in 2021 with enteric bacterial panel and C diff which were normal.    -Her description of abdominal pain appears consistent with secretory diarrhea. Differential includes microscopic colitis or bile acid diarrhea. Recommend holding her PPI today to see if this alleviates her symptoms  -?IBS, patient states this is what she has been told but unclear how much additional workup has been performed  -She does not want to use Imodium as it makes her constipated and she is very concerned about a bowel obstruction    -KUB today with moderate stool burden    #MGUS  -Follows heme onc with Dr. Gladstone Lighter. Bone biopsy on 12/2020  -Negative amyloidosis on fat pad biopsy 04/2021  ??  #CKD3   -Cr is 1.0 around baseline   ??  #HLD:  -atorvastatin 40mg  daily  ??  #Back pain  #Neuropathy  -Tizanidine 2mg  q8h prn  -gabapentin 300mg  TID    Active Hospital Problems    Diagnosis Date Noted   ??? (HFpEF) heart failure with preserved ejection fraction (CMS-HCC) [I50.30] 12/10/2021   ??? NSVT (nonsustained ventricular tachycardia) (CMS-HCC) [I47.29] 12/10/2021   ??? 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 Stage 3 chronic kidney disease (CMS-HCC) [N18.30] 06/16/2018   ??? Morbid obesity (CMS-HCC) [E66.01] 03/17/2015      Resolved Hospital Problems    Diagnosis Date Noted Date Resolved   ??? Acute heart failure with preserved ejection fraction (CMS-HCC) [I50.31] 12/10/2021 12/10/2021       Nutrition:   Diet Orders          Diet cardiac(low fat,  salt, cholesterol) sodium 2 gm (low), fluid restricted 2000 ml starting at 04/23 2111          Code Status: Full Code    Signed:  Deontae Robson  12/11/2021, 10:32 AM

## 2021-12-12 LAB — BASIC METABOLIC PANEL
Anion Gap: 13 mmol/L (ref 3–16)
BUN: 11 mg/dL (ref 7–25)
CO2: 23 mmol/L (ref 21–33)
Calcium: 8.8 mg/dL (ref 8.6–10.3)
Chloride: 107 mmol/L (ref 98–110)
Creatinine: 1.05 mg/dL (ref 0.60–1.30)
EGFR: 56
Glucose: 89 mg/dL (ref 70–100)
Osmolality, Calculated: 295 mOsm/kg (ref 278–305)
Potassium: 4 mmol/L (ref 3.5–5.3)
Sodium: 143 mmol/L (ref 133–146)

## 2021-12-12 LAB — CBC
Hematocrit: 42.3 % (ref 35.0–45.0)
Hemoglobin: 13.7 g/dL (ref 11.7–15.5)
MCH: 27.7 pg (ref 27.0–33.0)
MCHC: 32.5 g/dL (ref 32.0–36.0)
MCV: 85.5 fL (ref 80.0–100.0)
MPV: 10.3 fL (ref 7.5–11.5)
Platelets: 83 10*3/uL (ref 140–400)
RBC: 4.95 10*6/uL (ref 3.80–5.10)
RDW: 15.4 % (ref 11.0–15.0)
WBC: 4.4 10*3/uL (ref 3.8–10.8)

## 2021-12-12 LAB — B NATRIURETIC PEPTIDE: BNP: 66 pg/mL (ref 0–100)

## 2021-12-12 LAB — MAGNESIUM: Magnesium: 1.8 mg/dL (ref 1.5–2.5)

## 2021-12-12 MED ORDER — polyethylene glycol (GLYCOLAX) 17 gram/dose powder
17 | Freq: Two times a day (BID) | ORAL | 0 refills | 14.00000 days | Status: AC
Start: 2021-12-12 — End: ?
  Filled 2021-12-12: qty 238, 7d supply, fill #0

## 2021-12-12 MED ORDER — furosemide (LASIX) 40 MG tablet
40 | ORAL_TABLET | Freq: Every day | ORAL | 0 refills | Status: AC | PRN
Start: 2021-12-12 — End: ?
  Filled 2021-12-12: qty 60, 60d supply, fill #0

## 2021-12-12 MED ORDER — empagliflozin (JARDIANCE) 10 mg tablet
10 | ORAL_TABLET | Freq: Every day | ORAL | 0 refills | Status: AC
Start: 2021-12-12 — End: 2022-01-11
  Filled 2021-12-12: qty 30, 30d supply, fill #0

## 2021-12-12 MED FILL — CHOLECALCIFEROL (VITAMIN D3) 25 MCG (1,000 UNIT) TABLET: 1000 1000 units | ORAL | Qty: 2

## 2021-12-12 MED FILL — CARVEDILOL 12.5 MG TABLET: 12.5 12.5 MG | ORAL | Qty: 2

## 2021-12-12 MED FILL — POLYETHYLENE GLYCOL 3350 17 GRAM ORAL POWDER PACKET: 17 17 gram | ORAL | Qty: 1

## 2021-12-12 MED FILL — ALBUTEROL SULFATE 2.5 MG/3 ML (0.083 %) SOLUTION FOR NEBULIZATION: 2.5 2.5 mg /3 mL (0.083 %) | RESPIRATORY_TRACT | Qty: 3

## 2021-12-12 MED FILL — HEPARIN (PORCINE) 5,000 UNIT/ML INJECTION SOLUTION: 5000 5,000 unit/mL | INTRAMUSCULAR | Qty: 1

## 2021-12-12 MED FILL — ATORVASTATIN 20 MG TABLET: 20 20 MG | ORAL | Qty: 2

## 2021-12-12 MED FILL — GABAPENTIN 100 MG CAPSULE: 100 100 MG | ORAL | Qty: 1

## 2021-12-12 NOTE — Unmapped (Signed)
Problem: Knowledge Deficit  Goal: Patient/family/caregiver demonstrates understanding of disease process, treatment plan, medications, and discharge instructions  Description: Complete learning assessment and assess knowledge base.  Outcome: Progressing     Problem: Potential for Compromised Skin Integrity  Goal: Skin integrity is maintained or improved  Description: Assess and monitor skin integrity. Identify patients at risk for skin breakdown on admission and per policy. Collaborate with interdisciplinary team and initiate plans and interventions as needed.  Outcome: Progressing  Goal: Nutritional status is improving  Description: Monitor and assess patient for malnutrition (ex- brittle hair, bruises, dry skin, pale skin and conjunctiva, muscle wasting, smooth red tongue, and disorientation). Collaborate with interdisciplinary team and initiate plan and interventions as ordered.  Monitor patient's weight and dietary intake as ordered or per policy. Utilize nutrition screening tool and intervene per policy. Determine patient's food preferences and provide high-protein, high-caloric foods as appropriate.   Outcome: Progressing     Problem: Incontinence  Goal: Perineal skin integrity is maintained or improved  Description: Assess genitourinary system, perineal skin, labs (urinalysis), and history of incontinence to include past management, aggravating, and alleviating factors.  Collaborate with interdisciplinary team and initiate plans and interventions as needed.  Outcome: Progressing

## 2021-12-12 NOTE — Unmapped (Signed)
REFERRAL FOR HOME HEALTH SERVICES FORM     Patient name: Veronica Winters  Patient MRN: [id number]  DOB: Apr 09, 1951  Age: 71 y.o.  Gender: female  SSN: [id number]  Address: [address] [place] [address]     Phone number: [phone] (home)    Patient emergency contact: Extended Emergency Contact Information  Primary Emergency Contact: Lucio Edward  Work Phone: [phone]  Mobile Phone: [phone]  Relation: Sister  Secondary Emergency Contact: Lavone Nian  Mobile Phone: [phone]  Relation: Brother    Date of admission: 12/09/2021  Date of discharge: 12/12/2021  Attending provider: No att. providers found  Primary care physician: PROVIDER NOT IN SYSTEM    Code status: Full Code  Allergies:   Allergies   Allergen Reactions   ??? Vancomycin Other (See Comments)     Other reaction(s): Other (See Comments), Other (See Comments)  Drug induced NEUTROPENIA  Drug induced NEUTROPENIA  Drug induced NEUTROPENIA  Drug induced NEUTROPENIA  Drug induced NEUTROPENIA  Drug induced NEUTROPENIA     ??? Aspirin      Other reaction(s): Other (See Comments), Other (see comments), Other (See Comments)  Stomach bleeding  Stomach bleeding  Stomach bleeding  Stomach bleeding  Stomach bleeding  Stomach bleeding     ??? Ibuprofen Other (See Comments)     Other reaction(s): Other (See Comments), Other (see comments)  Stomach bleeding  Stomach bleeding  Stomach bleeding     ??? Mushroom Hives and Itching   ??? Shellfish Derived Hives and Itching   ??? Sulfa (Sulfonamide Antibiotics) Nausea And Vomiting, Hives and Itching   ??? Sulfasalazine Hives and Itching   ??? Acetaminophen Itching   ??? Ciprofloxacin      Other reaction(s): Other (See Comments), Other (see comments)  Made bottom raw  Made bottom raw  Made bottom raw  Made bottom raw     ??? Coconut Oil    ??? Metronidazole      Other reaction(s): Other (See Comments), Other (see comments)  Made bottom raw  Made bottom raw  Made bottom raw  Made bottom raw     ??? Amoxicillin-Pot  Clavulanate Itching     Has never had problems with amoxicillin/ penicillin    HAS TOLERATED ZOSYN WITH ZERO PROBLEMS  Has never had problems with amoxicillin/ penicillin    HAS TOLERATED ZOSYN WITH ZERO PROBLEMS  Has never had problems with amoxicillin/ penicillin    HAS TOLERATED ZOSYN WITH ZERO PROBLEMS  Has never had problems with amoxicillin/ penicillin    HAS TOLERATED ZOSYN WITH ZERO PROBLEMS     ??? Coconut Flavor Itching   ??? Codeine Itching   ??? Egg Derived Nausea And Vomiting   ??? Iodine Itching   ??? Metrizamide Hives     Takes Benadryl 50mg  PO before receiving iodinated contrast  Takes Benadryl 50mg  PO before receiving iodinated contrast  Takes Benadryl 50mg  PO before receiving iodinated contrast     ??? Povidone-Iodine Itching       Insurance Information     Insurance Information                [employer] Phone: [phone]    Subscriber: Senetra, Dillin Subscriber#: [id number]    Group#: [id number] Precert#: --        Luther Redo MDCD Lilyan Gilford MEDICAID Phone: [phone]    Subscriber: Dayna, Alia Subscriber#: [id number]    Group#: [id number] Precert#: --  Diagnoses Present on Admission   Primary Diagnosis: (HFpEF) heart failure with preserved ejection fraction (CMS-HCC)  Discharge Diagnosis :   Active Hospital Problems    Diagnosis Date Noted   ??? (HFpEF) heart failure with preserved ejection fraction (CMS-HCC) [I50.30] 12/10/2021   ??? NSVT (nonsustained ventricular tachycardia) (CMS-HCC) [I47.29] 12/10/2021   ??? 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 Stage 3 chronic kidney disease (CMS-HCC) [N18.30] 06/16/2018   ??? Morbid obesity (CMS-HCC) [E66.01] 03/17/2015      Resolved Hospital Problems    Diagnosis Date Noted Date Resolved   ??? Acute heart failure with preserved ejection fraction (CMS-HCC) [I50.31] 12/10/2021 12/10/2021     Prognosis:  good  Rehabilitation potential: good    Diet     Diet Orders          Diet cardiac(low fat, salt, cholesterol) sodium 2 gm (low), fluid restricted 2000 ml starting at 04/23 2111           Cardiac Diet    Services Required   Skilled Nursing  Home Health Aide  Physical Therapy: Plan  Treatment/Interventions: LE strengthening/ROM, Endurance training, Equipment eval/education, Gait training, Compensatory technique education, Continued evaluation, Neuromuscular Reeducation, Stair Training, Therapeutic Activity, Therapeutic Exercise  PT Frequency: minimum 3x/week  Recommendation  Recommendation: Short-term skilled PT  Equipment Recommended: Defer to facility to obtain  Occupational Therapy: Plan  Treatment Interventions: ADL retraining, Activity Tolerance training, IADL retraining, Energy Conservation, Functional transfer training, UE strengthening/ROM, Therapeutic Activity, Compensatory technique education, Excercise  OT Frequency: minimum 3x/week  Recommendation  Recommendation: Short-term skilled OT  Equipment Recommendations: Patient already has needed DME    Weight bearing status: full    Needs 24 hour supervision due to cognitive impairment: No    Discharge Medications   Medications:  Discharge Medication List as of 12/12/2021 10:52 AM      START taking these medications    Details   empagliflozin (JARDIANCE) 10 mg tablet Take 1 tablet (10 mg total) by mouth daily for 30 days., Starting Wed 12/12/2021, Until Fri 01/11/2022, Normal, Disp-30 tablet, R-0      polyethylene glycol (GLYCOLAX) 17 gram/dose powder Mix one capful (17 grams) in 8 ounces of liquid and drink by mouth 2 times a day., Starting Wed 12/12/2021, Normal, Disp-238 g, R-0         CONTINUE these medications which have CHANGED    Details   furosemide (LASIX) 40 MG tablet Take 1 tablet (40 mg total) by mouth daily as needed. Please take Lasix 40 mg if your early morning standing weight is over 275 lbs.   If despite taking the water pill your weight does not  go lower 275 after 2-3 days of taking the water pill, You should  call your cardiologist., Starting Wed 12/12/2021, Normal, Disp-60 tablet, R-0         CONTINUE these medications which have NOT CHANGED    Details   acetaminophen (TYLENOL) 500 MG tablet Take by mouth., Starting Sat 11/03/2021, Historical Med      albuterol (PROVENTIL) 2.5 mg /3 mL (0.083 %) nebulizer solution Inhale into the lungs., Historical Med      atorvastatin (LIPITOR) 40 MG tablet Take 1 tablet (40 mg total) by mouth daily., Starting Wed 10/04/2020, Historical Med      buPROPion SR tablet 100mg , 12 HR Sustained-Release Take by mouth., Starting Thu 02/06/2017, Historical Med      carvediloL (COREG) 25 MG tablet Take 1  tablet (25 mg total) by mouth 2 times a day., Starting Fri 01/07/2020, Historical Med      cholecalciferol, vitamin D3, 1000 units tablet Take 2 tablets (2,000 Units total) by mouth daily., Starting Wed 11/10/2019, Historical Med      gabapentin (NEURONTIN) 300 MG capsule Take by mouth., Starting Wed 07/15/2018, Historical Med      omeprazole (PRILOSEC) 20 MG capsule Take by mouth., Starting Wed 09/08/2019, Historical Med      tiZANidine (ZANAFLEX) 2 MG tablet Starting Mon 07/16/2021, Historical Med      valsartan (DIOVAN) 320 MG tablet Take 1 tablet (320 mg total) by mouth daily., Starting Thu 11/08/2021, Until Wed 02/06/2022, Historical Med, Disp-30 tablet, R-2         STOP taking these medications       amLODIPine (NORVASC) 5 MG tablet Comments:   Reason for Stopping:         cefUROXime (CEFTIN) 500 MG tablet Comments:   Reason for Stopping:         hydrALAZINE (APRESOLINE) 50 MG tablet Comments:   Reason for Stopping:         nirmatrelvir 150 mg - ritonavir 100 mg (PAXLOVID) 300 mg (150 mg x 2)-100 mg DsPk Comments:   Reason for Stopping:                   Discharge Specific Orders   Discharge specific orders:  None required  Isolation     Patient Isolation Status     Isolation Added Added By Removed Removed By    None active     Removed    Contact PLUS 12/09/21 Vanetta Shawl, MD 12/11/21 Martin Majestic, RN          Vitals   No data found.    Equipment/Supplies   Prior equipment   No current labs  Ordering Physician:     Physician Certification   Further, I certify that my clinical findings support that this patient is homebound (i.e. absences from home require considerable and taxing effort and are for medical reasons or religious services or infrequently or short duration when for other reasons) due to deconditioning it would be a taxing effort to receive outpatient services.    My signature below is to certify that this patient is under my care and that I, or nurse practitioner, or a physician assistant working with me, had a face-to-face encounter with this is patient on: 12/12/2021     Follow-up Appointments and [facility] Discharge Physician Name   No future appointments.  Laurette Schimke  [address]  [address] [place] [address]  [phone]    Follow up on 12/25/2021  Appointment at 2:30pm    [facility]  [address]  [place] [address]  [phone]          Discharging Physician Signature and Credentials   Discharging Physician: Electronically signed by Charlett Nose  12/12/2021, 4:18 PM    Physician to follow up Information   PCP: PROVIDER NOT IN SYSTEM  PCP address: None  PCP phone number: None  PCP fax number: None    If PCP is not following patient, type physician contact information here:           Patient will be followed by PCP    Discharge Planner and Credentials     Provider/Company Name and Contact Number:           [facility] at Discharge  Advertising copywriter at [employer] post discharge: Home Health Care  Home Health Care Name/Phone # post discharge: [facility]  Home Health Services Types at Discharge: PT/OT/SLP, Skilled Nursing, Home Health Aide    Discharge Planner Name and Telephone Number: Marisue Ivan [phone]

## 2021-12-12 NOTE — Unmapped (Signed)
Nursing Overnight Progress Note    Significant Events During Shift  None    Patient/Family Concerns  Evening/Overnight Visitors: none  Concerns: none    Assessment  Nursing time demands: low  IV access: has IV access, adequate and functioning  Sitter requirements:  no    Mental Status  Mental Status for the past 14 hrs:   Orientation Level   12/11/21 2154 Oriented X4       Calls to Physician Team  No data found.    Medications  [phone] [id number] - Medications Not Given  (last 12 hrs)         ** SITE UNKNOWN **       Medication Name Action Time Action Reason Comments     polyethylene glycol (MIRALAX) packet 17 g 12/11/21 2058 Not Given Patient/family refused                 Diet/Meals Consumed (past 24 hours)  Nutrition Assessment for the past 24 hrs:   Percent Meals Eaten (%) Appetite Fluid Restrictions   12/11/21 0845 -- -- 2000   12/11/21 1000 25 % -- --   12/11/21 1727 50 % Good --

## 2021-12-12 NOTE — Unmapped (Signed)
[facility]  DEPARTMENT OF INTERNAL MEDICINE  DAILY PROGRESS NOTE    Chief Complaint / Reason for Follow-Up     Veronica Winters is a 71 y.o. female on hospital day 2.  The principal reason for today's follow up visit is (HFpEF) heart failure with preserved ejection fraction (CMS-HCC).    Interval History / Subjective     Pt continues to feel better and less fatigued and dyspneic today. Her primary complaint is her chronic watery diarrhea/abdominal pain, though this appears to be improved. She reports 2 episodes of diarrhea yesterday.    She has been working with PT but declines SNF due to negative experiences in the past. She lives alone but states she feels she has good support from her sister and can be independent with her ADLs. She feels comfortable going home today with home health.    Past medical, family, and social histories were reviewed as previously documented. Updates were made as necessary.    Review of Systems     Constitutional: positive for fatigue, negative for fevers, sweats and weight loss  Eyes: negative  Ears, nose, mouth, throat, and face: negative  Respiratory: negative for cough, hemoptysis and sputum  Cardiovascular: negative for chest pain and palpitations  Gastrointestinal: positive for abdominal pain and diarrhea, negative for constipation, melena, vomiting and hematochezia  Genitourinary:negative  Hematologic/lymphatic: negative  Musculoskeletal:negative  Neurological: negative except for weakness  Behavioral/Psych: negative  Endocrine: negative    Medications     Scheduled Meds:  ??? atorvastatin  40 mg Oral Daily 0900   ??? carvediloL  25 mg Oral BID   ??? cholecalciferol (vitamin D3)  2,000 Units Oral Daily 0900   ??? gabapentin  100 mg Oral TID   ??? heparin  5,000 Units Subcutaneous 3 times per day   ??? pneumococcal 23 polyvalent vaccine  0.5 mL Intramuscular During Hospitalization   ??? polyethylene glycol  17 g Oral BID   ??? valsartan  320 mg Oral Daily 0900     Continuous Infusions:  PRN  Meds:albuterol, tiZANidine    Vital Signs     Temp:  [97.5 ??F (36.4 ??C)-98.5 ??F (36.9 ??C)] 97.5 ??F (36.4 ??C)  Heart Rate:  [62-70] 62  Resp:  [16-20] 20  BP: (128-152)/(59-79) 152/79    Intake/Output Summary (Last 24 hours) at 12/12/2021 1238  Last data filed at 12/12/2021 1200  Gross per 24 hour   Intake 720 ml   Output 1100 ml   Net -380 ml       Physical Exam     BP 152/79 (BP Location: Right arm, Patient Position: Lying)    Pulse 62    Temp 97.5 ??F (36.4 ??C) (Oral)    Resp 20    Ht 5' 5 (1.651 m)    Wt (!) 271 lb 6.2 oz (123.1 kg)    SpO2 100%    BMI 45.16 kg/m??     General Appearance:    Alert, cooperative, ill appearing   Head:    Normocephalic, without obvious abnormality, atraumatic   Neck:   Supple, symmetrical, trachea midline, no adenopathy;     thyroid:  no enlargement/tenderness/nodules   Lungs:     Clear to auscultation bilaterally, respirations unlabored   Chest Wall:    No tenderness or deformity    Heart:    Regular rate and rhythm, S1 and S2 normal, no murmur, rub   or gallop   Abdomen:     Soft, tender to palpation diffusely with  no guarding or rebound. Well healed surgical scars present   Extremities:   Extremities atraumatic, no cyanosis. Trace pitting edema bilaterally in calves and ankles, less tender to palpation bilaterally. No asymmetry or erythema.   Pulses:   2+ and symmetric all extremities   Skin:   Skin color, texture, turgor normal, no rashes or lesions   Lymph nodes:   Cervical, supraclavicular, and axillary nodes normal   Neurologic:   Grossly intact with no focal deficits       Laboratory Data         Lab 12/12/21  0701 12/11/21  0612 12/10/21  0417 12/09/21  2122   WBC 4.4 3.8 3.8 3.3*   HEMOGLOBIN 13.7 13.8 13.5 13.9   HEMATOCRIT 42.3 41.7 40.3 42.4   MEAN CORPUSCULAR VOLUME 85.5 84.8 84.1 84.2   PLATELETS 83* 97* 105* 97*         Lab 12/12/21  0701 12/11/21  0612 12/10/21  0417 12/09/21  2122 12/09/21  1627   SODIUM 143 143 144 144 142   POTASSIUM 4.0 3.7 3.8 3.3* 3.7   CHLORIDE  107 107 104 104 106   CO2 23 27 29 28 24    BUN 11 10 9 9 9    CREATININE 1.05 1.00 1.00 0.97 1.07   GLUCOSE 89 103* 99 103* 94   CALCIUM 8.8 9.2 8.6 9.1 9.2   MAGNESIUM 1.8 1.9 1.9 2.0 1.8   PHOSPHORUS  --  3.9 3.8 2.6 2.8         Lab 12/09/21  2122   INR 1.0   PROTHROMBIN TIME 13.7         Lab 12/11/21  0612 12/09/21  2122   ALT  --  8   AST  --  13   ALK PHOS  --  48   BILIRUBIN TOTAL  --  0.7   ALBUMIN  --  4.4   ALBUMINKID 3.5  --          Lab 12/09/21  2151   COLOR, URINE Colorless*   CLARITY Clear   PROTEIN UA Negative   PH UA 7.0   SPECIFIC GRAVITY, URINE 1.007   GLUCOSE UA Negative   BLOOD UA Negative   LEUKOCYTES UA Negative   NITRITE UA Negative   BILIRUBIN UA Negative   UROBILINOGEN UA <2.0     Diagnostic Studies     Reviewed.     12/11/21 TTE:  Impressions:   - Poor image quality limits interpretation. No prior for comparison.   - Consider alternate imaging modality(cardiac MRI) if clinically indicated.     12/11/21 KUB:  IMPRESSION:   ??   1. ??No plain radiographic evidence of acute abnormality.     Results for orders placed or performed during the hospital encounter of 12/09/21   ECG 12 lead (MUSE)    Narrative    Ventricular Rate:  67  BPM  Atrial Rate:  67  BPM  P-R Interval:  168  ms  QRS Duration:  60  ms  QT:  452  ms  QTc:  477  ms  P Axis:  -12  degrees  R Axis:  1  degrees  T Axis:  -77  degrees  Diagnosis Line:  NORMAL SINUS RHYTHM ^ CANNOT RULE OUT ANTERIOR INFARCT , AGE UNDETERMINED ^ ST SEGMENT CHANGE AND INFEROLATERAL T WAVE INVERSION ^ ABNORMAL ECG       Assessment & Plan     Veronica Winters is a  71 y.o. female on HD# 0 with (HFpEF) heart failure with preserved ejection fraction (CMS-HCC).  The medical issues being addressed in today's encounter are as follows:    #HFpEF, with concern for excacerbation  #SOB  #HTN  #Presumptive Asthma   SOB for several months and weight gain ~10 lb from dry weight, and did not take her medications on the day of admission. CXR without acute cardiopulmonary  disease and EKG is unchanged from priors.   -Improved edema after 60 mg IV lasix in the ED 4/23, holding further diuresis at this time  -Repeat standing weight this morning   -Valsartan 320 daily (this is her home dose). Continue Coreg 25 BID (half home dose)   -PT/OT  -Cardiac diet   -Discussed possible etiology of her heart failure exacerbation. She reports good adherence to diet and is knowledgeable about her home medications, though did miss her dose yesterday prior to admission. She did have a few short courses of NSVT (<6 beats) when we were seeing her 4/24- she was asymptomatic with normal BP during these observed episodes. Given her history of MGUS as well as reported FHx of amyloidosis in her daughter, she could benefit from workup for infiltrative cardiomyopathy. She did have a negative fat pad biopsy 04/2021 which is highly specific, but less sensitive and does not necessarily rule out amyloidosis. -TTE 4/25 suggests reduced systolic function to 45-50% (prior Echo in 2021 shows EF 60-65%) but is limited by poor image quality. Consider cardiac MRI in outpatient setting (she gets most of her care at an OSH)   -Home albuterol PRN  -Dispo: Home today with home health. From a clinical standpoint she continues to improve and does not show signs of acute fluid overload   ??  #Chronic Diarrhea:  -been going on for over a year per patient. Has had CTAPs and colonoscopies in 2021 which were normal (s/p 1 sessile polyp removal). Also had negative infection workup in 2021 with enteric bacterial panel and C diff which were normal.    -Her description of abdominal pain appears consistent with secretory diarrhea. Differential includes microscopic colitis or bile acid diarrhea.She will hold her PPI to see if this relieves her symptoms  -?IBS, patient states this is what she has been told but unclear how much additional workup has been performed  -KUB 4/25 with moderate stool burden which raises the possibility of overflow  incontinence. Recommend trial with Miralax upon discharge. Discussed our rationale in detail with the patient and she verbalizes understanding.     #MGUS  -Follows heme onc with Dr. Gladstone Lighter. Bone biopsy on 12/2020  -Negative amyloidosis on fat pad biopsy 04/2021  ??  #CKD3   -Cr is ~1.0 around baseline   ??  #HLD:  -atorvastatin 40mg  daily  ??  #Back pain  #Neuropathy  -Tizanidine 2mg  q8h prn  -gabapentin 300mg  TID    Active Hospital Problems    Diagnosis Date Noted   ??? (HFpEF) heart failure with preserved ejection fraction (CMS-HCC) [I50.30] 12/10/2021   ??? NSVT (nonsustained ventricular tachycardia) (CMS-HCC) [I47.29] 12/10/2021   ??? 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 Stage 3 chronic kidney disease (CMS-HCC) [N18.30] 06/16/2018   ??? Morbid obesity (CMS-HCC) [E66.01] 03/17/2015      Resolved Hospital Problems    Diagnosis Date Noted Date Resolved   ??? Acute heart failure with preserved ejection fraction (CMS-HCC) [I50.31] 12/10/2021 12/10/2021       Nutrition:  Diet Orders          Diet cardiac(low fat, salt, cholesterol) sodium 2 gm (low), fluid restricted 2000 ml starting at 04/23 2111          Code Status: Full Code    Signed:  Mervin Ramires  12/12/2021, 12:38 PM

## 2021-12-12 NOTE — Unmapped (Signed)
[place]    Case Manager/Social Worker Discharge Summary     Patient name: Veronica Winters                                        Patient MRN: [id number]  DOB: Dec 08, 1950                              Age: 71 y.o.              Gender: female  Patient emergency contact: Extended Emergency Contact Information  Primary Emergency Contact: Lucio EdwardMitchell, Veronica  Work Phone: [phone]  Mobile Phone: [phone]  Relation: Sister  Secondary Emergency Contact: Lavone NianSmith, Veronica  Mobile Phone: [phone]  Relation: Brother      Attending provider: Hyacinth MeekerMichael Colin Binder, MD  Primary care physician: PROVIDER NOT IN SYSTEM    The MD has indicated that the patient is ready for discharge.  [facility] Patient family to transport home.  Transfer Mode/Level of Care: Family         DC Summary and COC have been faxed to facility.     The plan has been reviewed:     Patient/Family Informed of Discharge Plan: Yes    Plan Reviewed With Patient, Family, or Significant Other: Yes    Patient and or family are aware and in agreement with the discharge plan: Yes    Family Member Name and Relationship Notified at Discharge: N/A Patient A&O x4        Plan reviewed with MD and other members of the health care team: Yes  Care Plan Completed: Yes    No further CM/SW needs.    This plan has been reviewed with the multi-disciplinary team.     Treatment Preferences     To return home safely    Post-Discharge Goals         Post Acute Care Provider Information:           Advertising copywriterCommunity Services at Discharge  Community Services at Home post discharge: Home Health Care  Home Health Care Name/Phone # post discharge: [facility]  Home Health Services Types at Discharge: PT/OT/SLP, Skilled Nursing, Home Health Aide        Veronica Winters, MSW, [facility] City[facility]  Inpatient Social Worker  [facility] Team  [phone]

## 2021-12-12 NOTE — Unmapped (Signed)
[facility]  Inpatient Discharge Summary     Patient: Veronica Winters  Age: 71 y.o.    MRN: [id number]   CSN: [id number]     Date of Admission: 12/09/2021  Date of Discharge: 12/12/2021  Attending Physician: No att. providers found   Primary Care Physician: Mare Loan, MD    Diagnoses Present on Admission     Past Medical History:   Diagnosis Date   ??? (HFpEF) heart failure with preserved ejection fraction (CMS-HCC) 12/10/2021   ??? Asthma    ??? CHF (congestive heart failure) (CMS-HCC)    ??? Hypercholesteremia    ??? Hypertension    ??? Renal disorder         Discharge Diagnoses     Active Hospital Problems    Diagnosis Date Noted   ??? (HFpEF) heart failure with preserved ejection fraction (CMS-HCC) [I50.30] 12/10/2021   ??? NSVT (nonsustained ventricular tachycardia) (CMS-HCC) [I47.29] 12/10/2021   ??? 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 Stage 3 chronic kidney disease (CMS-HCC) [N18.30] 06/16/2018   ??? Morbid obesity (CMS-HCC) [E66.01] 03/17/2015      Resolved Hospital Problems    Diagnosis Date Noted Date Resolved   ??? Acute heart failure with preserved ejection fraction (CMS-HCC) [I50.31] 12/10/2021 12/10/2021       Operations/Procedures Performed (include dates)     Surgeries: N/A    Lines and tubes:  Patient Lines/Drains/Airways Status     Active Line / PIV Line     Name Placement date Placement time Site Days    Peripheral IV 12/10/21 Left Antecubital 12/10/21  1843  Antecubital  1                Other Procedures / Pertinent Imaging:    TTE 12/11/21:  Impressions:   - Poor image quality limits interpretation. No prior for comparison.   - Consider alternate imaging modality(cardiac MRI) if clinically indicated.     Results for orders placed or performed during the hospital encounter of 12/09/21   ECG 12 lead (MUSE)    Narrative    Ventricular Rate:  67  BPM  Atrial Rate:  67  BPM  P-R Interval:  168  ms  QRS Duration:  60  ms  QT:  452   ms  QTc:  477  ms  P Axis:  -12  degrees  R Axis:  1  degrees  T Axis:  -77  degrees  Diagnosis Line:  INTERPRETATION NOT AVAILABLE--ECG READ IN ER ^ Confirmed by PHYSICIAN, ER ([id number]), editor LALLY, LINDA ([id number]) on 12/10/2021 12:08:48 PM          Consulting Services (include reason)         Allergies     Allergies   Allergen Reactions   ??? Vancomycin Other (See Comments)     Other reaction(s): Other (See Comments), Other (See Comments)  Drug induced NEUTROPENIA  Drug induced NEUTROPENIA  Drug induced NEUTROPENIA  Drug induced NEUTROPENIA  Drug induced NEUTROPENIA  Drug induced NEUTROPENIA     ??? Aspirin      Other reaction(s): Other (See Comments), Other (see comments), Other (See Comments)  Stomach bleeding  Stomach bleeding  Stomach bleeding  Stomach bleeding  Stomach bleeding  Stomach bleeding     ??? Ibuprofen Other (See Comments)     Other reaction(s): Other (See Comments), Other (see comments)  Stomach bleeding  Stomach bleeding  Stomach bleeding     ???  Mushroom Hives and Itching   ??? Shellfish Derived Hives and Itching   ??? Sulfa (Sulfonamide Antibiotics) Nausea And Vomiting, Hives and Itching   ??? Sulfasalazine Hives and Itching   ??? Acetaminophen Itching   ??? Ciprofloxacin      Other reaction(s): Other (See Comments), Other (see comments)  Made bottom raw  Made bottom raw  Made bottom raw  Made bottom raw     ??? Coconut Oil    ??? Metronidazole      Other reaction(s): Other (See Comments), Other (see comments)  Made bottom raw  Made bottom raw  Made bottom raw  Made bottom raw     ??? Amoxicillin-Pot Clavulanate Itching     Has never had problems with amoxicillin/ penicillin    HAS TOLERATED ZOSYN WITH ZERO PROBLEMS  Has never had problems with amoxicillin/ penicillin    HAS TOLERATED ZOSYN WITH ZERO PROBLEMS  Has never had problems with amoxicillin/ penicillin    HAS TOLERATED ZOSYN WITH ZERO PROBLEMS  Has never had problems with amoxicillin/ penicillin    HAS TOLERATED ZOSYN WITH ZERO PROBLEMS     ??? Coconut  Flavor Itching   ??? Codeine Itching   ??? Egg Derived Nausea And Vomiting   ??? Iodine Itching   ??? Metrizamide Hives     Takes Benadryl 50mg  PO before receiving iodinated contrast  Takes Benadryl 50mg  PO before receiving iodinated contrast  Takes Benadryl 50mg  PO before receiving iodinated contrast     ??? Povidone-Iodine Itching       Discharge Medications        Medication List      TAKE these medications, which are NEW      Quantity/Refills   Jardiance 10 mg tablet  Generic drug: empagliflozin  Take 1 tablet (10 mg total) by mouth daily for 30 days.   Quantity: 30 tablet  Refills: 0    HFmEF     polyethylene glycol 17 gram/dose powder  Commonly known as: GLYCOLAX  Mix one capful (17 grams) in 8 ounces of liquid and drink by mouth 2 times a day.   Quantity: 238 g  Refills: 0    Constipation       TAKE these medication, which have CHANGED      Quantity/Refills   furosemide 40 MG tablet  Commonly known as: LASIX  Take 1 tablet (40 mg total) by mouth daily as needed. Please take Lasix 40 mg if your early morning standing weight is over 275 lbs.   If despite taking the water pill your weight does not go lower 275 after 2-3 days of taking the water pill, You should call your cardiologist.  What changed:   ?? how much to take  ?? how to take this  ?? when to take this  ?? reasons to take this  ?? additional instructions   Quantity: 60 tablet  Refills: 0    Volume, HF      TAKE these medications, which you were ALREADY TAKING      Quantity/Refills   acetaminophen 500 MG tablet  Commonly known as: TYLENOL  Take by mouth.   Refills: 0    Pain    albuterol 2.5 mg /3 mL (0.083 %) nebulizer solution  Commonly known as: PROVENTIL  Inhale into the lungs.   Refills: 0    Dyspnea    atorvastatin 40 MG tablet  Commonly known as: LIPITOR  Take 1 tablet (40 mg total) by mouth daily.   Refills: 0  HLD   buPROPion HCl SR 100 MG  Commonly known as: WELLBUTRIN SR  Take by mouth.   Refills: 0    Mood disorder    carvediloL 25 MG tablet  Commonly  known as: COREG  Take 1 tablet (25 mg total) by mouth 2 times a day.   Refills: 0    HF   cholecalciferol (vitamin D3) 1000 units tablet  Take 2 tablets (2,000 Units total) by mouth daily.   Refills: 0    Vitamin D    gabapentin 300 MG capsule  Commonly known as: NEURONTIN  Take by mouth.   Refills: 0    Chronic Pain    omeprazole 20 MG capsule  Commonly known as: PRILOSEC  Take by mouth.   Refills: 0    GERD    tiZANidine 2 MG tablet  Commonly known as: ZANAFLEX   Refills: 0  Muscle Spasm    valsartan 320 MG tablet  Commonly known as: DIOVAN  Take 1 tablet (320 mg total) by mouth daily.   Quantity: 30 tablet  Refills: 2  HF       STOP taking these medications    amLODIPine 5 MG tablet  Commonly known as: NORVASC     cefUROXime 500 MG tablet  Commonly known as: CEFTIN     hydrALAZINE 50 MG tablet  Commonly known as: APRESOLINE     nirmatrelvir 150 mg - ritonavir 100 mg 300 mg (150 mg x 2)-100 mg Dspk  Commonly known as: PAXLOVID           Where to Get Your Medications      These medications were sent to [facility]  [address] [place], [place] [address]    Hours: Sunday - Saturday: 8:00AM - 6:00PM Phone: [phone]   ?? furosemide 40 MG tablet  ?? Jardiance 10 mg tablet  ?? polyethylene glycol 17 gram/dose powder             Discharge Exam     Physical Exam  Vitals reviewed.   Constitutional:       General: She is not in acute distress.     Appearance: Normal appearance. She is obese.   HENT:      Head: Normocephalic.   Cardiovascular:      Rate and Rhythm: Normal rate and regular rhythm.      Pulses: Normal pulses.      Heart sounds: Normal heart sounds.   Pulmonary:      Effort: Pulmonary effort is normal.      Breath sounds: Normal breath sounds. No wheezing, rhonchi or rales.   Abdominal:      General: Abdomen is flat.      Palpations: Abdomen is soft.      Tenderness: There is abdominal tenderness. There is no guarding or rebound.   Musculoskeletal:      Cervical back: Normal range of  motion.      Comments: No LE edema.    Skin:     General: Skin is warm.      Findings: No bruising, erythema or rash.   Neurological:      General: No focal deficit present.      Mental Status: She is alert and oriented to person, place, and time. Mental status is at baseline.   Psychiatric:         Mood and Affect: Mood normal.         Thought Content: Thought content normal.  Reason for Admission     Rula A Mezo is a 71 y.o. female with PMH of HFpEF (60-65%), HTN, CKD, HLD, MGUS, GERD, presumed asthma who presented with gradually worsening dyspnea on exertion, weakness, and dizziness.     Hospital Course     Active Hospital Problems    Diagnosis Date Noted   ??? (HFpEF) heart failure with preserved ejection fraction (CMS-HCC) [I50.30] 12/10/2021   ??? NSVT (nonsustained ventricular tachycardia) (CMS-HCC) [I47.29] 12/10/2021   ??? 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 Stage 3 chronic kidney disease (CMS-HCC) [N18.30] 06/16/2018   ??? Morbid obesity (CMS-HCC) [E66.01] 03/17/2015      Resolved Hospital Problems    Diagnosis Date Noted Date Resolved   ??? Acute heart failure with preserved ejection fraction (CMS-HCC) [I50.31] 12/10/2021 12/10/2021     #HFmEF exacerbation (45-50%)  #HTN  #SOB  #Presumptive Asthma  In the ED patient was hypertensive but otherwise HDS. Weight was up 10lb from dry weight. Labs largely unremarkable other than BNP of 201, and stable leukopenia and thrombocytopenia. CXR was unremarkable and EKG was unchanged from priors. She required 60mg  IV lasix x 1 with improvement in edema and SOB. Resumed home Coreg and Valsartan. TTE showed reduced EF from baseline (45-50%) but limited by image quality. The underlying cause of her HF exacerbation is unclear as she reports medication compliance. Seems to be also compliance with diet.  Findings of low QRS voltage on EKG and occasional NSVTs on telemetry, along with Hx of  MGUS and Hx of amyloidosis in her daughter raise the possibility of infiltrative cardiomyopathy. Could be benefit from cardiac MRI for evaluation of infiltrative CMP although TTE with no suggestive findings (however poor quality). Tried to reach out her OP Cardiology office but was not available.  Clinically she has no further signs of fluid overload and feels ready for discharge, she declined SNF and will go home with home health.   -Discharge Regimen: Start Jardiance 10 mg, cont Coreg 50mg  BID and Valsartan 320 QD. Scheduled Lasix 40mg  daily prn if morning weight >275lb.   -Discharge weight 271, Cr on discharge: 1.05, BNP: 66   -Consider cMRI OP for evaluation of infiltrative CMP     #Chronic Diarrhea  Chronic for several years with unclear etiology. Had CTAP that was normal on 10/21   Colonoscopy on 2021: with Normal terminal ileum and normal mucosa (s/p sessile polyp removal). Negative infx work up on 2021 (Enteric panel+ C diff <0).   Outside records suggest investigations for microscopic colitis vs IBS but results are not known. KUB showed moderate stool burden which suggests possible overflow. We recommend a trial of Miralax.     #MGUS  #Leukopenia  #Thrombocytopenia  --Follows heme onc with Dr. Gladstone Lighter. Bone biopsy on 12/2020. Negative amyloidosis on fat pad biopsy 04/2021  -Following heme/onc outpatient    #HLD  - Atorvastatin 40 mg daily  ??  #GERD  - Pantoprazole 40 mg daily   ??  #Back pain  #Neuropathy  - Tizanidine 2 mg q8h PRN  - Gabapentin 300 mg TID    #CKD III  -Cr ~1.0 at baseline throughout admission.     Condition on Discharge     1. Functional Status: mildly impaired   Describe limitations, if any: Sleeps in recliner at  home due to chronic back pain. Has difficulty getting up from supine position. This is stable from her baseline.    2. Mental  Status: Alert/Oriented   Describe limitations, if any: N/A    3. Dietary Restrictions / Tube Feeding / TPN  Diet Orders          Diet cardiac(low fat, salt,  cholesterol) sodium 2 gm (low), fluid restricted 2000 ml starting at 04/23 2111        Cardiac Diet    4. Discharge specific orders:    None required  Heart Failure Documentation     ACE-I/ARB:YES    ARNI:No on ARB    NYHA Class:  III - Marked limitation of physical activity.  Comfortable at rest.  Less than ordinary activity causes fatigue, palpitation, dyspnea.    Evidence Based Beta Blocker, NYHA Class II-IV:Yes on Coreg     AHA/ACC Society Stage:  C - Objective evidence of moderately severe cardiovascular disease. Marked limitation in activity due to symptoms, even during less than ordinary activity. Comfortable only at rest    Aldosterone Antagonist: NO:  Other: No hypotensive during admission, to be started OP     Hydralazine: NO: Not indicated    Nitrate: NO: Not Indicated    SGLT2 Inhibitor: Yes    ICD Therapy Counseling: No: Not eligible (e.g. EF > 35%, new onset HF)    ICD Placed or Prescribed: No: Not eligible (i.g. EF > 35%, new onset HF)    CRT-D: No: Not eligible (e.g. EF>35%, new onset HR)    Atrial fibrillation/flutter: NO    Personalized Exercise/Activity Program: Yes: Cardiac Rehab Referral made    Cardiac Procedures/Surgeries/Imaging performed this admission:  Select all that apply:  Echocardiogram    Discharge Weight: Weight: (!) 271 lb 6.2 oz (123.1 kg)  Recent Labs     12/09/21  1627 12/09/21  2122 12/12/21  0701   BNP 201* 195* 66     HF Follow Up within 1 week:No future appointments.          5. Core measures followed:  Discharge Weight: (!) 271 lb 6.2 oz (123.1 kg)             Disposition     Home with Home Health      Follow-Up Appointments     No future appointments.  [facility]  [address]  [address] [place] [address]  [phone]    Follow up on 12/25/2021  Appointment at 2:30pm    [facility]  [address]  [place] [address]  [phone]        Signed:    Charlett Nose, MD  12/12/2021, 2:58 PM

## 2021-12-14 ENCOUNTER — Encounter

## 2021-12-14 MED ORDER — GABAPENTIN 300 MG PO CAPS
300 MG | ORAL_CAPSULE | Freq: Three times a day (TID) | ORAL | 0 refills | Status: AC
Start: 2021-12-14 — End: 2022-03-14

## 2021-12-14 NOTE — Telephone Encounter (Signed)
Requested Prescriptions     Pending Prescriptions Disp Refills    gabapentin (NEURONTIN) 300 MG capsule [Pharmacy Med Name: GABAPENTIN 300 MG *[place]*] 90 capsule 0     Sig: TAKE ONE CAPSULE BY MOUTH 3 TIMES DAILY       Last Clinic Visit:  11/08/2021     Next Clinic Appointment:  01/17/2022

## 2021-12-18 ENCOUNTER — Encounter

## 2021-12-18 MED ORDER — DULOXETINE HCL 30 MG PO CPEP
30 MG | ORAL_CAPSULE | ORAL | 2 refills | Status: DC
Start: 2021-12-18 — End: 2022-04-30

## 2021-12-18 MED ORDER — ATORVASTATIN CALCIUM 40 MG PO TABS
40 MG | ORAL_TABLET | Freq: Every day | ORAL | 1 refills | Status: AC
Start: 2021-12-18 — End: ?

## 2021-12-18 MED ORDER — VITAMIN D3 25 MCG PO TABS
25 MCG | ORAL_TABLET | ORAL | 2 refills | Status: AC
Start: 2021-12-18 — End: ?

## 2021-12-19 ENCOUNTER — Ambulatory Visit
Admit: 2021-12-19 | Discharge: 2021-12-19 | Payer: MEDICARE | Attending: Cardiovascular Disease | Primary: Family Medicine

## 2021-12-19 DIAGNOSIS — I5032 Chronic diastolic (congestive) heart failure: Secondary | ICD-10-CM

## 2021-12-19 MED ORDER — VALSARTAN 320 MG PO TABS
320 | ORAL_TABLET | Freq: Every day | ORAL | 3 refills | 30.00000 days | Status: DC
Start: 2021-12-19 — End: 2024-03-11

## 2021-12-19 MED ORDER — EMPAGLIFLOZIN 10 MG PO TABS
10 MG | ORAL_TABLET | Freq: Every day | ORAL | 3 refills | Status: AC
Start: 2021-12-19 — End: ?

## 2021-12-19 NOTE — Progress Notes (Signed)
Cc: Resistant HTN, HFpEF     HPI:     Gabrielle Wells is a 71 y.o. female with a past medical history of morbid obesity, resistant HTN, HFpEF, CKD, GERD, MGUS.       Patient was admitted at [facility] 2/22 - 10/15/19 for dehydration, AKI, sinus bradycardia (due to BB toxicity in the setting of AKI).      Echo 09/2019: nl LV, EF 01-09%, normal diastolic, normal valves and RV.     ECG 10/11/19: sinus brady 55, nonspecific changes, low voltage.     Renal duplex 09/2019: no RAS, indeterminate hypoechoic lesion R kidney.     Urine metanephrines w/ high norepi, normal cortisol, renin and aldosterone.      FLP 07/2019: TC 224, HDL 62, LDL 137, TG 124 off statin.     Lexi 10/2020: normal.    Echo 11/2021 ([facility]): Normal LV, LVEF 32-35%, grade 1 diastolic dysfunction, RV dysfunction, poor quality of bubble study therefore cannot exclude PFO.     Patient is here for a follow up with her daughter Gabrielle Wells. She has a visiting RN twice per week.  BP has been fluctuating from normal to high.      Histories     Past Medical History:   has a past medical history of Arthritis, Asthma, CHF (congestive heart failure) ([place]), Epidural abscess, GERD (gastroesophageal reflux disease), HTN (hypertension), Hyperlipidemia, IBS (irritable bowel syndrome), Incontinence of urine, Lumbar radiculopathy, Morbid obesity with BMI of 45.0-49.9, adult ([place]), Muscle fasciculation, Neuropathic pain, SBO (small bowel obstruction) ([place]), Spinal fracture of T8 vertebra ([place]), Surgical site infection, and Urinary incontinence.    Surgical History:   has a past surgical history that includes Appendectomy; Breast lumpectomy (Bilateral); Hysterectomy, total abdominal; Cholecystectomy; Total knee arthroplasty (Bilateral, 2009); Cardiac catheterization (2012); Total shoulder arthroplasty w/ distal clavicle excision (Right, 2010); Abdominal hernia repair (2010); Colonoscopy; Upper gastrointestinal endoscopy (N/A, 05/26/2020); Colonoscopy (05/26/2020); Colonoscopy  (05/26/2020); CT BIOPSY BONE MARROW (01/03/2021); and Abdomen surgery (N/A, 05/16/2021).     Social History:   reports that she has never smoked. She has never used smokeless tobacco. She reports that she does not drink alcohol and does not use drugs.     Family History:  No evidence for sudden cardiac death or premature CAD      Medications:     Home medications were reviewed and are listed below    Prior to Admission medications    Medication Sig Start Date End Date Taking? Authorizing Provider   valsartan (DIOVAN) 320 MG tablet Take 1 tablet by mouth daily 12/19/21 03/19/22 Yes Hervey Ard, MD   empagliflozin (JARDIANCE) 10 MG tablet Take 1 tablet by mouth daily 12/19/21  Yes Hervey Ard, MD   DULoxetine (CYMBALTA) 30 MG extended release capsule TAKE ONE CAPSULE BY MOUTH DAILY 12/18/21  Yes Hyman Bower, DO   atorvastatin (LIPITOR) 40 MG tablet Take 1 tablet by mouth daily 12/18/21  Yes Hyman Bower, DO   Cholecalciferol (VITAMIN D3) 25 MCG TABS TAKE TWO TABLETS BY MOUTH DAILY 12/18/21  Yes Hyman Bower, DO   gabapentin (NEURONTIN) 300 MG capsule Take 1 capsule by mouth 3 times daily for 90 days. 12/14/21 03/14/22 Yes Morene Crocker, MD   carvedilol (COREG) 25 MG tablet Take 2 tablets by mouth 2 times daily 09/25/21  Yes Hervey Ard, MD   albuterol sulfate HFA (VENTOLIN HFA) 108 (90 Base) MCG/ACT inhaler Inhale 2 puffs into the lungs 4 times daily as  needed for Wheezing 07/16/21  Yes Nemiah Commander, MD   tiZANidine (ZANAFLEX) 2 MG tablet Take 1 tablet by mouth nightly as needed (muscle spasms) 07/16/21  Yes Nemiah Commander, MD   ondansetron (ZOFRAN-ODT) 4 MG disintegrating tablet DISSOLVE 1 TABLET ON THE TONGUE EVERY 8 HOURS AS NEEDED FOR NAUSEA 07/16/21  Yes Nemiah Commander, MD   dicyclomine (BENTYL) 10 MG capsule TAKE ONE CAPSULE BY MOUTH FOUR TIMES DAILY 07/16/21  Yes Nemiah Commander, MD   Handicap Placard [place] by Does not apply route 07/05/2021-07/05/2024 07/05/21  Yes Jason Fila, MD   omeprazole  (PRILOSEC) 20 MG delayed release capsule Take 1 capsule by mouth every morning (before breakfast) 11/28/20  Yes Jhonnie Garner, MD   acetaminophen (TYLENOL) 500 MG tablet Take 1 tablet by mouth every 6 hours as needed for Pain   Yes Historical Provider, MD   furosemide (LASIX) 20 MG tablet Take 1 tablet by mouth daily 09/28/21 10/28/21  [name], MD   Cholecalciferol (VITAMIN D3) 25 MCG TABS TAKE TWO TABLETS BY MOUTH DAILY 08/15/21   Elmyra Ricks, MD          Allergy:     Vancomycin, Aspirin, Ibuprofen, Mushroom extract complex, Shellfish allergy, Shellfish-derived products, Sulfa antibiotics, Sulfasalazine, Acetaminophen, Betadine [povidone iodine], Ciprofloxacin, Coconut oil, Codeine, Fish-derived products, Iodides, Iodine, Metronidazole, Other, Sulfacetamide, Amoxicillin-pot clavulanate, Coconut flavor, Eggs or egg-derived products, and Iodinated contrast media       Review of Systems:     All 12 point review of symptoms completed. Pertinent positives identified in the HPI, all other review of symptoms negative as below.    CONSTITUTIONAL: No fatigue  SKIN: No rash or pruritis.  EYES: No visual changes or diplopia. No scleral icterus.  ENT: No Headaches, hearing loss or vertigo. No mouth sores or sore throat.  CARDIOVASCULAR: No chest pain/chest pressure/chest discomfort. No palpitations. No edema.   RESPIRATORY: No dyspnea. No cough or wheezing, no sputum production.  GASTROINTESTINAL: No N/V/D. No abdominal pain, appetite loss, blood in stools.  GENITOURINARY: No dysuria, trouble voiding, or hematuria.  MUSCULOSKELETAL:  No gait disturbance, weakness or joint complaints.  NEUROLOGICAL: No headache, diplopia, change in muscle strength, numbness or tingling. No change in gait, balance, coordination, mood, affect, memory, mentation, behavior.  PSHYCH: No anxiety, loss of interest, change in sexual behavior, feelings of self-harm, or confusion.  ENDOCRINE: No excessive thirst, fluid intake, or urination. No  tremor.  HEMATOLOGIC: No abnormal bruising or bleeding.  ALLERGY: No nasal congestion or hives.      Physical Examination:     Vitals:    12/19/21 1150 12/19/21 1156   BP: (!) 160/88 (!) 160/88   Pulse: 67    Weight: 272 lb 9.6 oz (123.7 kg)        Wt Readings from Last 3 Encounters:   12/19/21 272 lb 9.6 oz (123.7 kg)   11/08/21 279 lb 11.2 oz (126.9 kg)   09/25/21 281 lb 6.4 oz (127.6 kg)         General Appearance:  Alert, cooperative, no distress, appears stated age Appropriate weight   Head:  Normocephalic, without obvious abnormality, atraumatic   Eyes:  PERRL, conjunctiva/corneas clear EOM intact  Ears normal   Throat no lesions       Nose: Nares normal, no drainage or sinus tenderness   Throat: Lips, mucosa, and tongue normal   Neck: Supple, symmetrical, trachea midline, no adenopathy, thyroid: not enlarged, symmetric, no tenderness/mass/nodules, no carotid bruit  Lungs:   Clear to auscultation bilaterally, respirations unlabored   Chest Wall:  No tenderness or deformity   Heart:  Regular rhythm, rate is controlled, S1, S2 normal, there is no murmur, there is no rub or gallop, cannot assess jvd, 1+ bilateral lower extremity edema   Abdomen:   Soft, non-tender, bowel sounds active all four quadrants,  no masses, no organomegaly       Extremities: Extremities normal, atraumatic, no cyanosis   Pulses: 2+ and symmetric   Skin: Skin color, texture, turgor normal, no rashes or lesions   Pysch: Normal mood and affect   Neurologic: Normal gross motor and sensory exam.  Cranial nerves intact        Labs:     Lab Results   Component Value Date    WBC 8.1 04/23/2021    HGB 13.0 04/23/2021    HCT 40.3 04/23/2021    MCV 85.9 04/23/2021    PLT 113 (L) 04/23/2021     Lab Results   Component Value Date    NA 145 11/14/2021    K 3.4 (L) 11/14/2021    CL 106 11/14/2021    CO2 24 11/14/2021    BUN 8 11/14/2021    CREATININE 1.1 11/14/2021    GLUCOSE 96 11/14/2021    CALCIUM 9.0 11/14/2021    PROT 7.0 04/23/2021    LABALBU  4.2 11/14/2021    BILITOT <0.2 04/23/2021    ALKPHOS 78 04/23/2021    AST 16 04/23/2021    ALT 7 (L) 04/23/2021    LABGLOM 53 (A) 11/14/2021    GFRAA >60 04/23/2021    AGRATIO 1.0 (L) 05/08/2020    GLOB 4.2 05/08/2020         Lab Results   Component Value Date    CHOL 224 (H) 08/04/2019     Lab Results   Component Value Date    TRIG 124 08/04/2019     Lab Results   Component Value Date    HDL 62 (H) 08/04/2019     Lab Results   Component Value Date    LDLCALC 137 (H) 08/04/2019     Lab Results   Component Value Date    LABVLDL 25 08/04/2019     No results found for: [facility]    Lab Results   Component Value Date    INR 0.95 01/03/2021    INR 1.03 10/11/2019    PROTIME 10.7 01/03/2021    PROTIME 12.0 10/11/2019       The 10-year ASCVD risk score (Arnett DK, et al., 2019) is: 21.5%    Values used to calculate the score:      Age: 82 years      Sex: Female      Is Non-Hispanic African American: Yes      Diabetic: No      Tobacco smoker: No      Systolic Blood Pressure: 253 mmHg      Is BP treated: Yes      HDL Cholesterol: 62 mg/dL      Total Cholesterol: 224 mg/dL      Assessment / Plan:      Diagnosis Orders   1. Chronic diastolic heart failure [facility])  [facility] [place] [facility]OSR      2. Resistant hypertension  valsartan (DIOVAN) 320 MG tablet           1. Resistant HTN:  Patient  reports compliance with meds and diet. It could be secondary HTN (elevated norepinephrine per urine test). She follows with Dr Silvestre Moment, nephrologist. BP is uncontrolled at home.      -Will defer to PCP vs endocrinologist further investigation regarding abnormal norepi levels.   -Cw coreg 50 bid  -Cw valsartan 320 mg daily  -Low salt diet, she takes Lasix 20 mg daily.   -Referral to cardiac rehab and nutritionist     2. Chronic HFpEF:   Patient appears euvolemic.      -BP control as above.  -Cw Jardiance  -Low salt diet  -On lasix prn     3. Exertional dypnea and  fatigue:  Lexi nuc stress test excluded ischemia.     -Conservative approach     4. HLP:   Patient was started on lipitor by PCP.      Return in about 4 weeks (around 01/16/2022).    I have spent 42 minutes of reviewing records and face to face time with the patient with more than 50% spent counseling and coordinating care.       I have personally reviewed the reports and images of labs, radiological studies, cardiac studies including ECG's and telemetry, current and old medical records. The note was completed using EMR and Dragon dictation system. Every effort was made to ensure accuracy; however, inadvertent computerized transcription errors may be present.    All questions and concerns were addressed to the patient/family. Alternatives to my treatment were discussed.     I would like to thank you for providing me the opportunity to participate in the care of your patient. If you have any questions, please do not hesitate to contact me.     Hervey Ard, MD, [facility], [facility]  [address]  [place] [address]  Main Office Phone: [phone]  Fax: [phone]

## 2021-12-25 ENCOUNTER — Encounter: Payer: MEDICARE | Attending: Cardiovascular Disease | Primary: Family Medicine

## 2021-12-25 ENCOUNTER — Ambulatory Visit
Admit: 2021-12-25 | Discharge: 2021-12-25 | Payer: PRIVATE HEALTH INSURANCE | Attending: Nephrology | Primary: Family Medicine

## 2021-12-25 DIAGNOSIS — N179 Acute kidney failure, unspecified: Secondary | ICD-10-CM

## 2021-12-25 NOTE — Progress Notes (Signed)
[name] MD   [name] MD FASN FNKF  [name] MD   [name] MD      [facility]      Nephrology Consult Note    Reason for Consult:  AKI     Chief Complaint:  CHF   History Obtained From:  Patient     12/25/21     Hospitalized at [facility] 4/23-4/26  Acute on chronic CHF  Started on Jardiance 10 mg daily  Stopped Hydralazine & Amlodipine    C/o chronic diarrhea  Seeing GI in July  Imodium constipates her     Weighing herself daily  Weight stable  On Lasix 40 mg daily PRN - for weight > 270 lbs  + edema and SOB    Pt had a bone marrow biopsy - diagnosed with MGUS  Pt had a PET scan done   No amyloid on fat pad biopsy   GFR better     S/p EGD and Colonoscopy       Wt Readings from Last 3 Encounters:   12/25/21 276 lb (125.2 kg)   12/19/21 272 lb 9.6 oz (123.7 kg)   11/08/21 279 lb 11.2 oz (126.9 kg)       History of Present Illness:    This is a 71 y.o. female who presents to the office for evaluation of  AKI on CKD 3   Pt has CHF was on diuretics   Off now for a week   BNP 200    BP low   On high dose Neurontin           Pt denies any hx of heavy or prolonged NSAID use.   There is no hx of jaundice or hepatitis or sexually transmitted disease.   Pt has no hx of collagen vascular disease or vasculitis.  No history of dysuria or frequency.No recent procedures involving IV contrast.   There is no hx of paraprotein disease.   Pt denies any hx of recurrent UTI , incontinence or nocturia or recurrent nephrolithiasis. No unusual skin rashes .   No tea coloured urine .   Medication reviewed and noted the use of ace/arb, diuretic.    Past Medical History:        Diagnosis Date    Arthritis     Asthma     CHF (congestive heart failure) ([place])     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    s/i I&D and removaal of hardware    GERD (gastroesophageal reflux disease)     HTN (hypertension)     Hyperlipidemia     IBS (irritable bowel syndrome)     Incontinence of urine     Lumbar radiculopathy      Morbid obesity with BMI of 45.0-49.9, adult ([facility])     Muscle fasciculation     Neuropathic pain     back 2/2 to spinal surgeries    SBO (small bowel obstruction) ([place]) 07/1999    with partial colectomy    Spinal fracture of T8 vertebra ([facility])     s/p fusion     Surgical site infection     back, spinal hardware     Urinary incontinence        Past Surgical History:        Procedure Laterality Date    ABDOMEN SURGERY N/A 05/16/2021    ABDOMINAL FAT PAD EXCISIONAL BIOPSY performed by Mikael Spray, MD at [place]  REPAIR  2010    APPENDECTOMY      BREAST LUMPECTOMY Bilateral     benign lesions    CARDIAC CATHETERIZATION  2012    CHOLECYSTECTOMY      COLONOSCOPY      COLONOSCOPY  05/26/2020    COLONOSCOPY POLYPECTOMY SNARE/COLD BIOPSY performed by Alphonse Guild, MD at [facility]  05/26/2020    COLONOSCOPY WITH BIOPSY performed by Alphonse Guild, MD at [place]  01/03/2021    CT BONE MARROW BIOPSY 01/03/2021 [facility] CT SCAN    HYSTERECTOMY, TOTAL ABDOMINAL (CERVIX REMOVED)      TOTAL KNEE ARTHROPLASTY Bilateral 2009    TOTAL SHOULDER ARTHROPLASTY W/ DISTAL CLAVICLE EXCISION Right 2010    UPPER GASTROINTESTINAL ENDOSCOPY N/A 05/26/2020    EGD BIOPSY performed by Alphonse Guild, MD at [facility] ENDOSCOPY       Current Medications:    Current Outpatient Medications   Medication Sig Dispense Refill    valsartan (DIOVAN) 320 MG tablet Take 1 tablet by mouth daily 90 tablet 3    empagliflozin (JARDIANCE) 10 MG tablet Take 1 tablet by mouth daily 90 tablet 3    DULoxetine (CYMBALTA) 30 MG extended release capsule TAKE ONE CAPSULE BY MOUTH DAILY 30 capsule 2    atorvastatin (LIPITOR) 40 MG tablet Take 1 tablet by mouth daily 90 tablet 1    Cholecalciferol (VITAMIN D3) 25 MCG TABS TAKE TWO TABLETS BY MOUTH DAILY 60 tablet 2    gabapentin (NEURONTIN) 300 MG capsule Take 1 capsule by mouth 3 times daily for 90 days. 90 capsule 0    carvedilol (COREG) 25 MG tablet Take  2 tablets by mouth 2 times daily 360 tablet 3    albuterol sulfate HFA (VENTOLIN HFA) 108 (90 Base) MCG/ACT inhaler Inhale 2 puffs into the lungs 4 times daily as needed for Wheezing 18 g 0    tiZANidine (ZANAFLEX) 2 MG tablet Take 1 tablet by mouth nightly as needed (muscle spasms) 30 tablet 1    ondansetron (ZOFRAN-ODT) 4 MG disintegrating tablet DISSOLVE 1 TABLET ON THE TONGUE EVERY 8 HOURS AS NEEDED FOR NAUSEA 21 tablet 0    dicyclomine (BENTYL) 10 MG capsule TAKE ONE CAPSULE BY MOUTH FOUR TIMES DAILY 120 capsule 0    Handicap Placard MISC by Does not apply route 07/05/2021-07/05/2024 1 each 0    omeprazole (PRILOSEC) 20 MG delayed release capsule Take 1 capsule by mouth every morning (before breakfast) 90 capsule 1    acetaminophen (TYLENOL) 500 MG tablet Take 1 tablet by mouth every 6 hours as needed for Pain      furosemide (LASIX) 20 MG tablet Take 1 tablet by mouth daily (Patient taking differently: Take 2 tablets by mouth daily Take 80 mg if wt goes up 3-5lbs) 30 tablet 5     No current facility-administered medications for this visit.       Allergies:  Vancomycin, Aspirin, Ibuprofen, Mushroom extract complex, Shellfish allergy, Shellfish-derived products, Sulfa antibiotics, Sulfasalazine, Acetaminophen, Betadine [povidone iodine], Ciprofloxacin, Coconut oil, Codeine, Fish-derived products, Iodides, Iodine, Metronidazole, Other, Sulfacetamide, Amoxicillin-pot clavulanate, Coconut flavor, Eggs or egg-derived products, and Iodinated contrast media    Social History:   Social History     Socioeconomic History    Marital status: Single     Spouse name: Not on file    Number of children: Not on file    Years of education: Not on file  Highest education level: Not on file   Occupational History    Not on file   Tobacco Use    Smoking status: Never    Smokeless tobacco: Never   Vaping Use    Vaping Use: Never used   Substance and Sexual Activity    Alcohol use: Never    Drug use: Never    Sexual activity: Not  on file   Other Topics Concern    Not on file   Social History Narrative    Not on file     Social Determinants of Health     Financial Resource Strain: Not on file   Food Insecurity: Not on file   Transportation Needs: Not on file   Physical Activity: Not on file   Stress: Not on file   Social Connections: Not on file   Intimate Partner Violence: Not on file   Housing Stability: Not on file       Family History:   Family History   Problem Relation Age of Onset    Cancer Paternal Grandmother         unk        Review of Systems:    Constitutional: No fever, no chills, no lethargy, no weakness.  HEENT:  No headache, otalgia, itchy eyes, nasal discharge or sore throat.  Cardiac:  No chest pain, dyspnea, orthopnea or PND.  Chest:  No cough, phlegm or wheezing.  Abdomen:  No abdominal pain, nausea or vomiting.  Neuro:  No focal weakness, abnormal movements orseizure like activity.  Skin:   No rashes, no itching.  GU:   No hematuria, no pyuria, no dysuria, no flank pain.  Extremities:  No swelling or joint pains.      Objective:  BP (!) 144/77   Pulse 65   Wt 276 lb (125.2 kg)   BMI 48.89 kg/m     Physical Exam:  General appearance:Awake, alert, in no acute distress  Skin: warm and dry, no rash or erythema  Eyes: conjunctivae normal and sclera anicteric  ENT: :no thrush no pharyngeal congestion    Neck: without any JVD. No carotid bruits or thyromegaly. No  Lymphadenopathty:  Pulmonary: lungs are clear and without any wheezing or rhonchi   Cardiovascular:Normal S1 & S2, No S3 or  S4, No  Pericardial rub , No Murmur Abdomen: soft nontender, bowel sounds present, no organomegaly,  No ascites  Extremities: no cyanosis, clubbing or edema    Labs:    CBC:   Lab Results   Component Value Date/Time    WBC 8.1 04/23/2021 10:46 PM    RBC 4.69 04/23/2021 10:46 PM    HGB 13.0 04/23/2021 10:46 PM    HCT 40.3 04/23/2021 10:46 PM    MCV 85.9 04/23/2021 10:46 PM    RDW 14.2 04/23/2021 10:46 PM    PLT 113 04/23/2021 10:46 PM    MPV  9.7 04/23/2021 10:46 PM      BMP:   Lab Results   Component Value Date/Time    NA 145 11/14/2021 11:59 AM    NA 141 04/23/2021 10:46 PM    NA 140 06/12/2020 11:47 AM    K 3.4 11/14/2021 11:59 AM    K 4.2 04/23/2021 10:46 PM    K 4.9 06/12/2020 11:47 AM    K 4.0 06/08/2020 04:19 PM    K 6.0 05/08/2020 05:20 PM    CL 106 11/14/2021 11:59 AM    CL 105 04/23/2021 10:46 PM  CL 103 06/12/2020 11:47 AM    CO2 24 11/14/2021 11:59 AM    CO2 26 04/23/2021 10:46 PM    CO2 25 06/12/2020 11:47 AM    BUN 8 11/14/2021 11:59 AM    BUN 9 04/23/2021 10:46 PM    BUN 8 06/12/2020 11:47 AM    CREATININE 1.1 11/14/2021 11:59 AM    CREATININE 1.0 04/23/2021 10:46 PM    CREATININE 0.9 06/12/2020 11:47 AM    GLUCOSE 96 11/14/2021 11:59 AM    GLUCOSE 125 04/23/2021 10:46 PM    GLUCOSE 106 06/12/2020 11:47 AM    CALCIUM 9.0 11/14/2021 11:59 AM    CALCIUM 9.3 04/23/2021 10:46 PM    CALCIUM 8.8 06/12/2020 11:47 AM      BNP:No results found for: BNP  PHOSPHORUS:    Lab Results   Component Value Date/Time    PHOS 3.2 11/14/2021 11:59 AM    PHOS 3.3 04/18/2020 02:00 PM    PHOS 2.9 03/07/2020 03:37 PM     MAGNESIUM: No results found for: MG  ALBUMIN:   Lab Results   Component Value Date/Time    LABALBU 4.2 11/14/2021 11:59 AM     IRON:  No results found for: IRON  IRON SATURATION:  No results found for: LABIRON  TIBC:  No results found for: TIBC  FERRITIN:  No results found for: FERRITIN  ANA: No results found for: ANA    SPEP:   Lab Results   Component Value Date/Time    PROT 7.0 04/23/2021 10:46 PM     UPEP: No results found for: TPU   HEPBSAG:No results found for: HEPBSAG  HEPCAB:No results found for: HEPCAB  C3: No results found for: C3  C4: No results found for: C4  MPO ANCA: No results found for: MPO .  PR3 ANCA:  No results found for: PR3  PTH: No results found for: PTH    Urine Creatinine:    Lab Results   Component Value Date/Time    LABCREA 446.6 03/07/2020 03:48 PM     Urine Eosinophils: No results found for: UREO  Urine Protein:  No  results found for: TPU  Urinalysis:  U/A:   Lab Results   Component Value Date/Time    NITRU Negative 04/23/2021 11:36 PM    COLORU Yellow 04/23/2021 11:36 PM    PHUR 7.5 04/23/2021 11:36 PM    WBCUA 0-2 04/23/2021 11:36 PM    RBCUA 0-2 04/23/2021 11:36 PM    MUCUS 1+ 04/23/2021 11:36 PM    BACTERIA 1+ 04/23/2021 11:36 PM    CLARITYU Clear 04/23/2021 11:36 PM    SPECGRAV 1.020 04/23/2021 11:36 PM    LEUKOCYTESUR TRACE 04/23/2021 11:36 PM    UROBILINOGEN 0.2 04/23/2021 11:36 PM    BILIRUBINUR Negative 04/23/2021 11:36 PM    BLOODU Negative 04/23/2021 11:36 PM    GLUCOSEU Negative 04/23/2021 11:36 PM    KETUA Negative 04/23/2021 11:36 PM    AMORPHOUS Rare 03/07/2020 03:49 PM     Wt Readings from Last 3 Encounters:   12/25/21 276 lb (125.2 kg)   12/19/21 272 lb 9.6 oz (123.7 kg)   11/08/21 279 lb 11.2 oz (126.9 kg)       Radiology:  Reviewed as available.    Assessment        Plan:    AKI   CHF   HTN   Neuropathy   Vit D def   IBS  MGUS       Plan  -  GFR stable  - BP controlled   - Pt had bone marrow biopsy and was diagnosed with MGUS   - AKI sec to vol changes   - EF nl   - BNP 202 --> 195 --> 66  - On ARB & BB  - On Jardiance & Lasix   - stopped CCB as that cause edema  - dec Neurontin   - GFR - 20--->60--->54 -->53 -->56    Dry wt 265 lbs at home       - Avoid any NSAIDS       Thank you for the consultation.  Please do not hesitate to call with questions.

## 2021-12-28 NOTE — Telephone Encounter (Signed)
Spoke to pt she explained she has been very fatigued today. Asked the pt if there has been any changes or new medication changes? Pt denies any changes Ask pt if she has been drinking enough fluids, she thinks she has been drinking enough fluids, I explained to pt that could be the reason for her fatigue and low BP reading. Pt took all medications as ordered.     Spoke to [facility] she reports her HR 62 and O2 sats 97% and pt was fatigued today. Milderd Meager also believe the pt could be dehydrated. Will consult with [facility].

## 2021-12-28 NOTE — Telephone Encounter (Signed)
Informed pt and [facility] would like the pt to decrease coreg to 25 mg twice daily, pt may hold lasix a couple of days if she is feeling dehydrated and then resume, increase fluids to help with the dehydration. Instructed pt to call the office in 2 weeks with BP and HR readings. Pt verbalized understanding.

## 2021-12-28 NOTE — Telephone Encounter (Signed)
Gabrielle Wells patients home aide called and said that she took the patients blood pressure and it was 91/56. Please advise [phone]

## 2022-01-08 NOTE — Telephone Encounter (Signed)
Spoke with pt she reports sx's of fatigue and SOB. Pt's coreg was decreased on 12/28/21 pt now takes coreg 25 mg once daily. Pt reports BP and HR readings:    12/28/21 91/56   12/29/21 135/94  12/30/21 136/96  12/31/21 138/84 HR 66  01/01/22 139/83 HR 62  01/02/22 112/71 HR 65  01/08/22 97/57 HR 57

## 2022-01-08 NOTE — Telephone Encounter (Signed)
Requested Prescriptions     Pending Prescriptions Disp Refills    carvedilol (COREG) 25 MG tablet [Pharmacy Med Name: CARVEDILOL 25 MG TABLET] 180 tablet 3     Sig: TAKE ONE TABLET BY MOUTH TWICE DAILY            Last Office Visit: 12/19/2021     Next Office Visit: 01/16/2022       Last Labs: 03.29.2023

## 2022-01-09 MED ORDER — CARVEDILOL 25 MG PO TABS
25 | ORAL_TABLET | ORAL | 3 refills | Status: DC
Start: 2022-01-09 — End: 2024-03-11

## 2022-01-09 NOTE — Telephone Encounter (Signed)
Informed pt GEB reviewed pt's BP and HR reading and would like pt to continue current therapy. Pt verbalized understanding.

## 2022-01-16 ENCOUNTER — Encounter: Payer: MEDICARE | Attending: Cardiovascular Disease

## 2022-01-17 ENCOUNTER — Ambulatory Visit: Payer: PRIVATE HEALTH INSURANCE | Attending: Student in an Organized Health Care Education/Training Program

## 2022-02-20 ENCOUNTER — Encounter

## 2022-02-21 ENCOUNTER — Encounter

## 2022-02-21 MED ORDER — FUROSEMIDE 20 MG PO TABS
20 MG | ORAL_TABLET | Freq: Every day | ORAL | 1 refills | Status: AC
Start: 2022-02-21 — End: 2022-03-23

## 2022-02-21 NOTE — Telephone Encounter (Signed)
Last Office Visit 12/25/2021  Next Office Visit 03/28/2022

## 2022-02-21 NOTE — Telephone Encounter (Signed)
Requested Prescriptions     Pending Prescriptions Disp Refills    gabapentin (NEURONTIN) 300 MG capsule [Pharmacy Med Name: GABAPENTIN 300 MG *KY*WV*] 90 capsule 0     Sig: TAKE ONE CAPSULE BY MOUTH 3 TIMES DAILY       Last Clinic Visit:  11/08/2021     Next Clinic Appointment:  Visit date not found

## 2022-03-01 ENCOUNTER — Ambulatory Visit: Payer: MEDICARE

## 2022-03-01 DIAGNOSIS — R109 Unspecified abdominal pain: Secondary | ICD-10-CM

## 2022-03-14 ENCOUNTER — Ambulatory Visit: Admit: 2022-03-14 | Discharge: 2022-03-18 | Payer: PRIVATE HEALTH INSURANCE

## 2022-03-14 ENCOUNTER — Ambulatory Visit: Payer: PRIVATE HEALTH INSURANCE | Attending: Family

## 2022-03-14 DIAGNOSIS — K529 Noninfective gastroenteritis and colitis, unspecified: Secondary | ICD-10-CM

## 2022-03-14 MED ORDER — methylcellulose, laxative, Powd
ORAL | 2 refills | Status: AC
Start: 2022-03-14 — End: ?

## 2022-03-14 MED ORDER — MIRALAX 17 gram/dose powder
17 | Freq: Every day | ORAL | 2 refills | 14.00000 days | Status: AC
Start: 2022-03-14 — End: 2022-06-12

## 2022-03-14 NOTE — Unmapped (Signed)
Start taking Citrucel daily - 1 tbsp in 8 oz of water    Take 1 capful of Miralax for 1 week. You can continue after the 1 week if it is working

## 2022-03-14 NOTE — Unmapped (Signed)
[facility]    New Patient Visit  Nurse Practitioner    CONSULT - Reason for Visit:  Chronic Diarrhea.  Requesting Physician: Charlett Nose, MD.    CHIEF COMPLAINT:  Chronic Diarrhea     HISTORY OF PRESENT ILLNESS:   Veronica Winters is a 71 y.o. female who is referred for the following: Chronic Diarrhea. Veronica Winters states she has had diarrhea for a few years. She has incontinence at times. She will have between 3-6 BM's per day that are mostly loose to watery. She has tried Imodium in the past but it caused constipation. She had a KUB on 12/11/2021 that showed a moderate stool burden.    She denies a recent change in bowel habits, hematochezia, melena, change in stool caliber, IDA, unintentional weight loss, abdominal pain, prior abdominal surgery, fever.     Denies any dysphagia, odynophagia, reflux, regurgitation, nausea. Denies any prior liver, pancreatic or gallbladder disease.      No history of anemia.  No sleep apnea. No narcotics. No history of anesthesia complications.    CT Abd/Pelvis w/o contrast: 03/01/2022  No acute intra-abdominal or pelvic pathology     Uncomplicated sigmoid diverticulosis.     Fatty infiltration of the liver     Last Colonoscopy: 2021  Colonoscopy on 2021: with Normal terminal ileum and normal mucosa (s/p sessile polyp removal). Negative infx work up on 2021 (Enteric panel+ C diff <0).  Outside records suggest investigations for microscopic colitis vs IBS but results are not known. KUB showed moderate stool burden which suggests possible overflow. We recommend a trial of Miralax.      Upper Endoscopy: never  Family History of Gastrointestinal Malignancy: none     Past Medical History:   Past Medical History:   Diagnosis Date    (HFpEF) heart failure with preserved ejection fraction (CMS-HCC) 12/10/2021    Asthma     CHF (congestive heart failure) (CMS-HCC)     Hypercholesteremia     Hypertension     Renal disorder        Past Surgical History:   No past surgical  history on file.    Current Medications:    Current Outpatient Medications   Medication Sig    acetaminophen Take by mouth.    albuterol Inhale into the lungs.    atorvastatin Take 1 tablet (40 mg total) by mouth daily.    buPROPion HCl SR Take by mouth.    carvediloL Take 1 tablet (25 mg total) by mouth 2 times a day.    cholecalciferol (vitamin D3) Take 2 tablets (2,000 Units total) by mouth daily.    furosemide Take 1 tablet (40 mg total) by mouth daily as needed. Please take Lasix 40 mg if your early morning standing weight is over 275 lbs.   If despite taking the water pill your weight does not go lower 275 after 2-3 days of taking the water pill, You should call your cardiologist.    gabapentin Take by mouth.    omeprazole Take by mouth.    polyethylene glycol Mix one capful (17 grams) in 8 ounces of liquid and drink by mouth 2 times a day.    tiZANidine      No current facility-administered medications for this visit.       ALLERGIES:  Vancomycin, Aspirin, Ibuprofen, Mushroom, Shellfish derived, Sulfa (sulfonamide antibiotics), Sulfasalazine, Acetaminophen, Ciprofloxacin, Coconut oil, Metronidazole, Amoxicillin-pot clavulanate, Coconut flavor, Codeine, Egg derived, Iodine, Metrizamide, and Povidone-iodine    SOCIAL HISTORY:  Social History     Socioeconomic History    Marital status: Single     Spouse name: Not on file    Number of children: Not on file    Years of education: Not on file    Highest education level: Not on file   Occupational History    Not on file   Tobacco Use    Smoking status: Never    Smokeless tobacco: Never   Substance and Sexual Activity    Alcohol use: Not Currently    Drug use: Not Currently    Sexual activity: Not Currently   Other Topics Concern    Not on file   Social History Narrative    Not on file     Social Determinants of Health     Financial Resource Strain: Not on file   Food Insecurity: Not on file   Physical Activity: Not on file   Stress: Not on file   Social  Connections: Not on file   Housing Stability: Not on file       FAMILY HISTORY:   No family history on file.    ASSESSMENTS:   BODY HABITUS AND NUTRITION STATUS:    There is no height or weight on file to calculate BMI.    REVIEW OF SYSTEMS:  (For the following ROS, pertinent positives are in bold; pertinent negatives are in italics.)  CONSTITUTIONAL: fevers, chills, sweats, weight loss, fatigue  ENMT: voice changes, sore throat  CV: chest pain, palpitations  RESP: cough, dyspnea  GI: see HPI  GU: hematuria, dysuria, frequency, urgency, abnormal vaginal bleeding  MSKL: pain, muscle weakness, joint pain  SKIN: rash, pain, pruritis  HEME/LYMPH: bruising, bleeding  NEURO: headache, dizziness  PSYCH: depression, anxiety    PHYSICAL EXAM:     Vital Signs:  There were no vitals taken for this visit.    CONSTITUTIONAL: WD/WN, in NAD, afebrile  NECK: supple, symmetrical, trachea midline  LUNGS: no increased work of breathing   CARDIOVASCULAR: RRR  ABDOMEN: no scars, normal BS, soft, NT, ND, no rebound or guarding, no hepatomegaly, no masses  MUSCULOSKELETAL: no LE edema  SKIN: no rashes and no jaundice  HEMATOLOGIC: no bleeding or bruising   NEURO: normal gait, aaox3    LABS:   Lab Results   Component Value Date    WBC 4.4 12/12/2021    HGB 13.7 12/12/2021    HCT 42.3 12/12/2021    MCV 85.5 12/12/2021    PLT 83 (L) 12/12/2021     Lab Results   Component Value Date    GLUCOSE 89 12/12/2021    BUN 11 12/12/2021    CREATININE 1.05 12/12/2021    K 4.0 12/12/2021    PHOS 3.9 12/11/2021    ALBUMIN 3.5 12/11/2021    EGFR 56 12/12/2021     Lab Results   Component Value Date    ALKPHOS 48 12/09/2021    ALT 8 12/09/2021    AST 13 12/09/2021    BILITOT 0.7 12/09/2021    ALBUMIN 3.5 12/11/2021    BILIDIRECT <0.2 04/23/2021    PROT 7.6 12/09/2021     No results found for: IRON, TIBC, FERRITIN     IMAGING and ENDOSCOPY:   As Noted in the HPI     ASSESSMENT AND PLAN:    Veronica Winters is a 71 y.o. female who is referred for the  following: Chronic Diarrhea. Veronica Winters has had diarrhea for years. I feel Imodium made her more  constipated than she already was. Her most recent KUB showed a moderate stool burden. I feel she is having constipation with overflow. She will start taking Citrucel and Miralax daily. She should have less episodes of incontinence if we can improve her bowel pattern. If this treatment does not help her Sx we will schedule a colonoscopy at next OV. Patient was agreeable with this plan.           Follow up: 3 months    Kathrine Haddock, APRN  [facility]   [phone]

## 2022-03-15 ENCOUNTER — Encounter

## 2022-03-19 ENCOUNTER — Encounter

## 2022-03-20 NOTE — Telephone Encounter (Signed)
Pt's primary care Gabrielle Wells would like the next OV in September faxed to her office. Fax [phone]    LM for pt to update her PCP at her next OV so the OV with GEB gets sent to the PCP.

## 2022-03-20 NOTE — Telephone Encounter (Signed)
Pt's primary care Gabrielle Wells would like to touch base with Dr.Blake around time of her appt. I made for sept. Missed follow up 5/31. Call back is [phone]

## 2022-03-22 ENCOUNTER — Encounter

## 2022-03-22 NOTE — Telephone Encounter (Unsigned)
Requested Prescriptions     Pending Prescriptions Disp Refills    gabapentin (NEURONTIN) 300 MG capsule [Pharmacy Med Name: GABAPENTIN 300 MG *KY*WV*] 93 capsule 0     Sig: TAKE ONE CAPSULE BY MOUTH 3 TIMES DAILY       Last Clinic Visit:  11/08/2021     Next Clinic Appointment:  Visit date not found; UNABLE TO FILL DUE TO NEEDS TO BE SEEN EVERY 90 DAYS; LEFT MESSAGE FOR PATIENT TO CALL FOR APPOINTMENT

## 2022-03-27 NOTE — Telephone Encounter (Signed)
Office mgr. left message on 03-26-22 that patient needs to call for appointment to get Neurontin refilled.  Patient needs to be seen every 90 days for this refill authorization.

## 2022-03-28 ENCOUNTER — Encounter: Payer: MEDICARE | Attending: Nephrology | Primary: Family Medicine

## 2022-04-11 ENCOUNTER — Encounter

## 2022-04-11 NOTE — Telephone Encounter (Signed)
Patient needs to be seen to refill Neurontin/ last appt 11-08-21; patient was called. she now goes to new PCP Jarrett Ables @ [facility].

## 2022-04-12 ENCOUNTER — Encounter: Payer: MEDICARE | Attending: Nephrology | Primary: Family Medicine

## 2022-04-12 ENCOUNTER — Encounter

## 2022-04-12 MED ORDER — FUROSEMIDE 20 MG PO TABS
20 MG | ORAL_TABLET | Freq: Every day | ORAL | 1 refills | Status: AC
Start: 2022-04-12 — End: 2022-05-12

## 2022-04-12 NOTE — Telephone Encounter (Signed)
Last Office Visit 12/25/2021  NO SHOW:03/28/22  Next Office Visit 04/26/22

## 2022-04-26 ENCOUNTER — Ambulatory Visit: Admit: 2022-04-26 | Discharge: 2022-04-26 | Payer: MEDICARE | Attending: Nephrology | Primary: Family Medicine

## 2022-04-26 DIAGNOSIS — I5032 Chronic diastolic (congestive) heart failure: Secondary | ICD-10-CM

## 2022-04-26 NOTE — Progress Notes (Signed)
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 female who presents to the office for evaluation of  AKI on CKD 3   Pt has CHF was on diuretics   Off now for a week   BNP 200    BP low   On high dose Neurontin           Pt denies any hx of heavy or prolonged NSAID use.   There is no hx of jaundice or hepatitis or sexually transmitted disease.   Pt has no hx of collagen vascular disease or vasculitis.  No history of dysuria or frequency.No recent procedures involving IV contrast.   There is no hx of paraprotein disease.   Pt denies any hx of recurrent UTI , incontinence or nocturia or recurrent nephrolithiasis. No unusual skin rashes .   No tea coloured urine .   Medication reviewed and noted the use of ace/arb, diuretic.    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    s/i I&D and removaal of hardware    GERD (gastroesophageal reflux disease)     HTN (hypertension)     Hyperlipidemia     IBS (irritable bowel syndrome)     Incontinence of urine     Lumbar radiculopathy     Morbid obesity with BMI of 45.0-49.9, adult (HCC)     Muscle fasciculation     Neuropathic pain     back 2/2 to  spinal surgeries    SBO (small bowel obstruction) ([place]) 07/1999    with partial colectomy    Spinal fracture of T8 vertebra ([place])     s/p fusion     Surgical site infection     back, spinal hardware     Urinary incontinence        Past Surgical History:        Procedure Laterality Date    ABDOMEN SURGERY N/A 05/16/2021    ABDOMINAL FAT PAD EXCISIONAL BIOPSY performed by Mikael Spray, MD at [place]  2010    APPENDECTOMY      BREAST LUMPECTOMY Bilateral     benign  lesions    CARDIAC CATHETERIZATION  2012    CHOLECYSTECTOMY      COLONOSCOPY      COLONOSCOPY  05/26/2020    COLONOSCOPY POLYPECTOMY SNARE/COLD BIOPSY performed by Alphonse Guild, MD at [place]  05/26/2020    COLONOSCOPY WITH BIOPSY performed by Alphonse Guild, MD at [place]  01/03/2021    CT BONE MARROW BIOPSY 01/03/2021 [place] CT SCAN    HYSTERECTOMY, TOTAL ABDOMINAL (CERVIX REMOVED)      TOTAL KNEE ARTHROPLASTY Bilateral 2009    TOTAL SHOULDER ARTHROPLASTY W/ DISTAL CLAVICLE EXCISION Right 2010    UPPER GASTROINTESTINAL ENDOSCOPY N/A 05/26/2020    EGD BIOPSY performed by Alphonse Guild, MD at [facility] ENDOSCOPY       Current Medications:    Current Outpatient Medications   Medication Sig Dispense Refill    furosemide (LASIX) 20 MG tablet Take 2 tablets by mouth daily Take 80 mg if wt goes up 3-5lbs 30 tablet 1    carvedilol (COREG) 25 MG tablet TAKE ONE TABLET BY MOUTH TWICE DAILY 180 tablet 3    empagliflozin (JARDIANCE) 10 MG tablet Take 1 tablet by mouth daily 90 tablet 3    DULoxetine (CYMBALTA) 30 MG extended release capsule TAKE ONE CAPSULE BY MOUTH DAILY 30 capsule 2    atorvastatin (LIPITOR) 40 MG tablet Take 1 tablet by mouth daily 90 tablet 1    Cholecalciferol (VITAMIN D3) 25 MCG TABS TAKE TWO TABLETS BY MOUTH DAILY 60 tablet 2    albuterol sulfate HFA (VENTOLIN HFA) 108 (90 Base) MCG/ACT inhaler Inhale 2 puffs into the lungs 4 times daily as needed for Wheezing 18  g 0    tiZANidine (ZANAFLEX) 2 MG tablet Take 1 tablet by mouth nightly as needed (muscle spasms) 30 tablet 1    ondansetron (ZOFRAN-ODT) 4 MG disintegrating tablet DISSOLVE 1 TABLET ON THE TONGUE EVERY 8 HOURS AS NEEDED FOR NAUSEA 21 tablet 0    dicyclomine (BENTYL) 10 MG capsule TAKE ONE CAPSULE BY MOUTH FOUR TIMES DAILY 120 capsule 0    Handicap Placard MISC by Does not apply route 07/05/2021-07/05/2024 1 each 0    omeprazole (PRILOSEC) 20 MG delayed release capsule Take 1 capsule by mouth every morning (before breakfast) 90 capsule 1    acetaminophen (TYLENOL) 500 MG tablet Take 1 tablet by mouth every 6 hours as needed for Pain      valsartan (DIOVAN) 320 MG tablet Take 1 tablet by mouth daily 90 tablet 3    gabapentin (NEURONTIN) 300 MG capsule Take 1 capsule by mouth 3 times daily for 90 days. 90 capsule 0     No current facility-administered medications for this visit.       Allergies:  Vancomycin, Aspirin, Ibuprofen, Mushroom extract complex, Shellfish allergy, Shellfish-derived products, Sulfa antibiotics, Sulfasalazine, Acetaminophen, Betadine [povidone iodine], Ciprofloxacin, Coconut (cocos nucifera), Codeine, Fish-derived products, Iodides, Iodine, Metronidazole, Other, Sulfacetamide, Amoxicillin-pot clavulanate, Coconut flavor, Eggs or egg-derived products, and Iodinated contrast media    Social History:   Social History     Socioeconomic History    Marital status: Single     Spouse name: Not on file    Number of children: Not on file    Years of education: Not on file    Highest education level: Not on file   Occupational History    Not on file   Tobacco Use    Smoking status: Never  Smokeless tobacco: Never   Vaping Use    Vaping Use: Never used   Substance and Sexual Activity    Alcohol use: Never    Drug use: Never    Sexual activity: Not on file   Other Topics Concern    Not on file   Social History Narrative    Not on file     Social Determinants of Health     Financial Resource Strain: Not on  file   Food Insecurity: Not on file   Transportation Needs: Not on file   Physical Activity: Not on file   Stress: Not on file   Social Connections: Not on file   Intimate Partner Violence: Not on file   Housing Stability: Not on file       Family History:   Family History   Problem Relation Age of Onset    Cancer Paternal Grandmother         unk        Review of Systems:    Constitutional: No fever, no chills, no lethargy, no weakness.  HEENT:  No headache, otalgia, itchy eyes, nasal discharge or sore throat.  Cardiac:  No chest pain, dyspnea, orthopnea or PND.  Chest:  No cough, phlegm or wheezing.  Abdomen:  No abdominal pain, nausea or vomiting.  Neuro:  No focal weakness, abnormal movements orseizure like activity.  Skin:   No rashes, no itching.  GU:   No hematuria, no pyuria, no dysuria, no flank pain.  Extremities:  No swelling or joint pains.      Objective:  BP (!) 147/107   Pulse 81   Wt 271 lb 9.6 oz (123.2 kg)   BMI 48.11 kg/m     Physical Exam:  General appearance:Awake, alert, in no acute distress  Skin: warm and dry, no rash or erythema  Eyes: conjunctivae normal and sclera anicteric  ENT: :no thrush no pharyngeal congestion    Neck: without any JVD. No carotid bruits or thyromegaly. No  Lymphadenopathty:  Pulmonary: lungs are clear and without any wheezing or rhonchi   Cardiovascular:Normal S1 & S2, No S3 or  S4, No  Pericardial rub , No Murmur Abdomen: soft nontender, bowel sounds present, no organomegaly,  No ascites  Extremities: no cyanosis, clubbing or edema    Labs:    CBC:   Lab Results   Component Value Date/Time    WBC 8.1 04/23/2021 10:46 PM    RBC 4.69 04/23/2021 10:46 PM    HGB 13.0 04/23/2021 10:46 PM    HCT 40.3 04/23/2021 10:46 PM    MCV 85.9 04/23/2021 10:46 PM    RDW 14.2 04/23/2021 10:46 PM    PLT 113 04/23/2021 10:46 PM    MPV 9.7 04/23/2021 10:46 PM      BMP:   Lab Results   Component Value Date/Time    NA 145 11/14/2021 11:59 AM    NA 141 04/23/2021 10:46 PM    NA 140  06/12/2020 11:47 AM    K 3.4 11/14/2021 11:59 AM    K 4.2 04/23/2021 10:46 PM    K 4.9 06/12/2020 11:47 AM    K 4.0 06/08/2020 04:19 PM    K 6.0 05/08/2020 05:20 PM    CL 106 11/14/2021 11:59 AM    CL 105 04/23/2021 10:46 PM    CL 103 06/12/2020 11:47 AM    CO2 24 11/14/2021 11:59 AM    CO2 26 04/23/2021 10:46 PM    CO2 25 06/12/2020  11:47 AM    BUN 8 11/14/2021 11:59 AM    BUN 9 04/23/2021 10:46 PM    BUN 8 06/12/2020 11:47 AM    CREATININE 1.1 11/14/2021 11:59 AM    CREATININE 1.0 04/23/2021 10:46 PM    CREATININE 0.9 06/12/2020 11:47 AM    GLUCOSE 96 11/14/2021 11:59 AM    GLUCOSE 125 04/23/2021 10:46 PM    GLUCOSE 106 06/12/2020 11:47 AM    CALCIUM 9.0 11/14/2021 11:59 AM    CALCIUM 9.3 04/23/2021 10:46 PM    CALCIUM 8.8 06/12/2020 11:47 AM      BNP:No results found for: BNP  PHOSPHORUS:    Lab Results   Component Value Date/Time    PHOS 3.2 11/14/2021 11:59 AM    PHOS 3.3 04/18/2020 02:00 PM    PHOS 2.9 03/07/2020 03:37 PM     MAGNESIUM: No results found for: MG  ALBUMIN:   Lab Results   Component Value Date/Time    LABALBU 4.2 11/14/2021 11:59 AM     IRON:  No results found for: IRON  IRON SATURATION:  No results found for: LABIRON  TIBC:  No results found for: TIBC  FERRITIN:  No results found for: FERRITIN  ANA: No results found for: ANA    SPEP:   Lab Results   Component Value Date/Time    PROT 7.0 04/23/2021 10:46 PM     UPEP: No results found for: TPU   HEPBSAG:No results found for: HEPBSAG  HEPCAB:No results found for: HEPCAB  C3: No results found for: C3  C4: No results found for: C4  MPO ANCA: No results found for: MPO .  PR3 ANCA:  No results found for: PR3  PTH: No results found for: PTH    Urine Creatinine:    Lab Results   Component Value Date/Time    LABCREA 446.6 03/07/2020 03:48 PM     Urine Eosinophils: No results found for: UREO  Urine Protein:  No results found for: TPU  Urinalysis:  U/A:   Lab Results   Component Value Date/Time    NITRU Negative 04/23/2021 11:36 PM    COLORU Yellow  04/23/2021 11:36 PM    PHUR 7.5 04/23/2021 11:36 PM    WBCUA 0-2 04/23/2021 11:36 PM    RBCUA 0-2 04/23/2021 11:36 PM    MUCUS 1+ 04/23/2021 11:36 PM    BACTERIA 1+ 04/23/2021 11:36 PM    CLARITYU Clear 04/23/2021 11:36 PM    SPECGRAV 1.020 04/23/2021 11:36 PM    LEUKOCYTESUR TRACE 04/23/2021 11:36 PM    UROBILINOGEN 0.2 04/23/2021 11:36 PM    BILIRUBINUR Negative 04/23/2021 11:36 PM    BLOODU Negative 04/23/2021 11:36 PM    GLUCOSEU Negative 04/23/2021 11:36 PM    KETUA Negative 04/23/2021 11:36 PM    AMORPHOUS Rare 03/07/2020 03:49 PM     Wt Readings from Last 3 Encounters:   04/26/22 271 lb 9.6 oz (123.2 kg)   12/25/21 276 lb (125.2 kg)   12/19/21 272 lb 9.6 oz (123.7 kg)       Radiology:  Reviewed as available.    Assessment        Plan:    AKI   CHF   HTN   Neuropathy   Vit D def   IBS  MGUS       Plan  - GFR stable with mild fluctuation   - BP controlled   - Pt had bone marrow biopsy and was diagnosed with MGUS   -  AKI sec to vol changes   - EF nl   - BNP 202 --> 195 --> 66--->16.1  - On ARB & BB  - On Jardiance & Lasix   - stopped CCB as that cause edema  - dec Neurontin   - GFR - 20--->60--->54 -->53 -->56--->49    Dry wt 270 lbs at home       - Avoid any NSAIDS       Thank you for the consultation.  Please do not hesitate to call with questions.

## 2022-04-29 NOTE — Telephone Encounter (Signed)
Labs were reviewed at appointment on 9/8.

## 2022-04-29 NOTE — Telephone Encounter (Signed)
Abnormal CRP and Sed Rate received. Sent to PA to review and advise.

## 2022-04-30 ENCOUNTER — Ambulatory Visit
Admit: 2022-04-30 | Discharge: 2022-04-30 | Payer: MEDICARE | Attending: Cardiovascular Disease | Primary: Family Medicine

## 2022-04-30 DIAGNOSIS — I5042 Chronic combined systolic (congestive) and diastolic (congestive) heart failure: Secondary | ICD-10-CM

## 2022-04-30 NOTE — Progress Notes (Signed)
Cc: Resistant HTN, HFpEF     HPI:     Gabrielle Wells is a 71 y.o. female with a past medical history of morbid obesity, resistant HTN, HFpEF, CKD, GERD, MGUS.       Patient was admitted at [facility] 2/22 - 10/15/19 for dehydration, AKI, sinus bradycardia (due to BB toxicity in the setting of AKI).      Echo 09/2019: nl LV, EF 96-29%, normal diastolic, normal valves and RV.     ECG 10/11/19: sinus brady 55, nonspecific changes, low voltage.     Renal duplex 09/2019: no RAS, indeterminate hypoechoic lesion R kidney.     Urine metanephrines w/ high norepi, normal cortisol, renin and aldosterone.      FLP 07/2019: TC 224, HDL 62, LDL 137, TG 124 off statin.     Lexi 10/2020: normal.    Echo 11/2021 ([facility]): Normal LV, LVEF 52-84%, grade 1 diastolic dysfunction, RV dysfunction, poor quality of bubble study therefore cannot exclude PFO.     Patient is here for follow-up.  She reports increasing diarrhea the last few months.  Otherwise denies any ischemic or congestive symptoms. Per Dr Silvestre Moment, when her weight is less than 270 pounds, she takes Lasix 20 mg daily; otherwise if weight is greater than 270 pounds, she takes Lasix 40 mg daily.      Histories     Past Medical History:   has a past medical history of Arthritis, Asthma, CHF (congestive heart failure) ([place]), Epidural abscess, GERD (gastroesophageal reflux disease), HTN (hypertension), Hyperlipidemia, IBS (irritable bowel syndrome), Incontinence of urine, Lumbar radiculopathy, Morbid obesity with BMI of 45.0-49.9, adult ([place]), Muscle fasciculation, Neuropathic pain, SBO (small bowel obstruction) ([place]), Spinal fracture of T8 vertebra ([place]), Surgical site infection, and Urinary incontinence.    Surgical History:   has a past surgical history that includes Appendectomy; Breast lumpectomy (Bilateral); Hysterectomy, total abdominal; Cholecystectomy; Total knee arthroplasty (Bilateral, 2009); Cardiac catheterization (2012); Total shoulder arthroplasty w/ distal clavicle excision  (Right, 2010); Abdominal hernia repair (2010); Colonoscopy; Upper gastrointestinal endoscopy (N/A, 05/26/2020); Colonoscopy (05/26/2020); Colonoscopy (05/26/2020); CT BIOPSY BONE MARROW (01/03/2021); and Abdomen surgery (N/A, 05/16/2021).     Social History:   reports that she has never smoked. She has never used smokeless tobacco. She reports that she does not drink alcohol and does not use drugs.     Family History:  No evidence for sudden cardiac death or premature CAD      Medications:     Home medications were reviewed and are listed below    Prior to Admission medications    Medication Sig Start Date End Date Taking? Authorizing Provider   furosemide (LASIX) 20 MG tablet Take 2 tablets by mouth daily Take 80 mg if wt goes up 3-5lbs 04/12/22 05/12/22 Yes Arshdeep Tindni, MD   carvedilol (COREG) 25 MG tablet TAKE ONE TABLET BY MOUTH TWICE DAILY 01/09/22  Yes Hervey Ard, MD   valsartan (DIOVAN) 320 MG tablet Take 1 tablet by mouth daily 12/19/21 04/30/22 Yes Hervey Ard, MD   empagliflozin (JARDIANCE) 10 MG tablet Take 1 tablet by mouth daily 12/19/21  Yes Hervey Ard, MD   atorvastatin (LIPITOR) 40 MG tablet Take 1 tablet by mouth daily 12/18/21  Yes Hyman Bower, DO   Cholecalciferol (VITAMIN D3) 25 MCG TABS TAKE TWO TABLETS BY MOUTH DAILY 12/18/21  Yes Hyman Bower, DO   albuterol sulfate HFA (VENTOLIN HFA) 108 (90 Base) MCG/ACT inhaler Inhale 2 puffs into the lungs 4  times daily as needed for Wheezing 07/16/21  Yes Nemiah Commander, MD   tiZANidine (ZANAFLEX) 2 MG tablet Take 1 tablet by mouth nightly as needed (muscle spasms) 07/16/21  Yes Nemiah Commander, MD   ondansetron (ZOFRAN-ODT) 4 MG disintegrating tablet DISSOLVE 1 TABLET ON THE TONGUE EVERY 8 HOURS AS NEEDED FOR NAUSEA 07/16/21  Yes Nemiah Commander, MD   Handicap Placard MISC by Does not apply route 07/05/2021-07/05/2024 07/05/21  Yes Jason Fila, MD   omeprazole (PRILOSEC) 20 MG delayed release capsule Take 1 capsule by mouth every morning  (before breakfast) 11/28/20  Yes Clayborne Dana, MD   acetaminophen (TYLENOL) 500 MG tablet Take 1 tablet by mouth every 6 hours as needed for Pain   Yes Historical Provider, MD   gabapentin (NEURONTIN) 300 MG capsule Take 1 capsule by mouth 3 times daily for 90 days. 12/14/21 03/14/22  Morene Crocker, MD          Allergy:     Vancomycin, Aspirin, Ibuprofen, Mushroom extract complex, Shellfish allergy, Shellfish-derived products, Sulfa antibiotics, Sulfasalazine, Acetaminophen, Betadine [povidone iodine], Ciprofloxacin, Coconut (cocos nucifera), Codeine, Fish-derived products, Iodides, Iodine, Metronidazole, Other, Sulfacetamide, Amoxicillin-pot clavulanate, Coconut flavor, Eggs or egg-derived products, and Iodinated contrast media       Review of Systems:     All 12 point review of symptoms completed. Pertinent positives identified in the HPI, all other review of symptoms negative as below.    CONSTITUTIONAL: No fatigue  SKIN: No rash or pruritis.  EYES: No visual changes or diplopia. No scleral icterus.  ENT: No Headaches, hearing loss or vertigo. No mouth sores or sore throat.  CARDIOVASCULAR: No chest pain/chest pressure/chest discomfort. No palpitations. No edema.   RESPIRATORY: No dyspnea. No cough or wheezing, no sputum production.  GASTROINTESTINAL: No N/V/D. No abdominal pain, appetite loss, blood in stools.  GENITOURINARY: No dysuria, trouble voiding, or hematuria.  MUSCULOSKELETAL:  No gait disturbance, weakness or joint complaints.  NEUROLOGICAL: No headache, diplopia, change in muscle strength, numbness or tingling. No change in gait, balance, coordination, mood, affect, memory, mentation, behavior.  PSHYCH: No anxiety, loss of interest, change in sexual behavior, feelings of self-harm, or confusion.  ENDOCRINE: No excessive thirst, fluid intake, or urination. No tremor.  HEMATOLOGIC: No abnormal bruising or bleeding.  ALLERGY: No nasal congestion or hives.      Physical Examination:     Vitals:     04/30/22 1456   BP: 130/84   Pulse: 76   Weight: 271 lb 9.6 oz (123.2 kg)       Wt Readings from Last 3 Encounters:   04/30/22 271 lb 9.6 oz (123.2 kg)   04/26/22 271 lb 9.6 oz (123.2 kg)   12/25/21 276 lb (125.2 kg)         General Appearance:  Alert, cooperative, no distress, appears stated age Appropriate weight   Head:  Normocephalic, without obvious abnormality, atraumatic   Eyes:  PERRL, conjunctiva/corneas clear EOM intact  Ears normal   Throat no lesions       Nose: Nares normal, no drainage or sinus tenderness   Throat: Lips, mucosa, and tongue normal   Neck: Supple, symmetrical, trachea midline, no adenopathy, thyroid: not enlarged, symmetric, no tenderness/mass/nodules, no carotid bruit       Lungs:   Clear to auscultation bilaterally, respirations unlabored   Chest Wall:  No tenderness or deformity   Heart:  Regular rhythm, rate is controlled, S1, S2 normal, there is no murmur, there is no rub or  gallop, cannot assess jvd, 1+ bilateral lower extremity edema   Abdomen:   Soft, non-tender, bowel sounds active all four quadrants,  no masses, no organomegaly       Extremities: Extremities normal, atraumatic, no cyanosis   Pulses: 2+ and symmetric   Skin: Skin color, texture, turgor normal, no rashes or lesions   Pysch: Normal mood and affect   Neurologic: Normal gross motor and sensory exam.  Cranial nerves intact        Labs:     Lab Results   Component Value Date    WBC 8.1 04/23/2021    HGB 13.0 04/23/2021    HCT 40.3 04/23/2021    MCV 85.9 04/23/2021    PLT 113 (L) 04/23/2021     Lab Results   Component Value Date    NA 145 11/14/2021    K 3.4 (L) 11/14/2021    CL 106 11/14/2021    CO2 24 11/14/2021    BUN 8 11/14/2021    CREATININE 1.1 11/14/2021    GLUCOSE 96 11/14/2021    CALCIUM 9.0 11/14/2021    PROT 7.0 04/23/2021    LABALBU 4.2 11/14/2021    BILITOT <0.2 04/23/2021    ALKPHOS 78 04/23/2021    AST 16 04/23/2021    ALT 7 (L) 04/23/2021    LABGLOM 53 (A) 11/14/2021    GFRAA >60 04/23/2021    AGRATIO  1.0 (L) 05/08/2020    GLOB 4.2 05/08/2020         Lab Results   Component Value Date    CHOL 224 (H) 08/04/2019     Lab Results   Component Value Date    TRIG 124 08/04/2019     Lab Results   Component Value Date    HDL 62 (H) 08/04/2019     Lab Results   Component Value Date    LDLCALC 137 (H) 08/04/2019     Lab Results   Component Value Date    LABVLDL 25 08/04/2019     No results found for: "CHOLHDLRATIO"    Lab Results   Component Value Date    INR 0.95 01/03/2021    INR 1.03 10/11/2019    PROTIME 10.7 01/03/2021    PROTIME 12.0 10/11/2019       The 10-year ASCVD risk score (Arnett DK, et al., 2019) is: 16.2%    Values used to calculate the score:      Age: 2 years      Sex: Female      Is Non-Hispanic African American: Yes      Diabetic: No      Tobacco smoker: No      Systolic Blood Pressure: 332 mmHg      Is BP treated: Yes      HDL Cholesterol: 62 mg/dL      Total Cholesterol: 224 mg/dL      Assessment / Plan:      Diagnosis Orders   1. Chronic combined systolic and diastolic CHF, NYHA class 1 (HCC)               1. Resistant HTN:  Patient reports compliance with meds and diet. It could be secondary HTN (elevated norepinephrine per urine test). She follows with Dr Silvestre Moment, nephrologist. BP is uncontrolled at home.      -Will defer to PCP vs endocrinologist further investigation regarding abnormal norepi levels.   -Cw coreg 25 bid  -Cw valsartan 320 mg daily  -Low salt diet, she takes Lasix  20-40 mg daily per Dr Silvestre Moment.      2. Chronic HFpEF:   Patient appears euvolemic.      -BP control as above.  -Cw Jardiance  -Low salt diet  -On lasix prn     3. Exertional dypnea and fatigue:  Lexi nuc stress test excluded ischemia.     -Conservative approach     4. HLP:   Patient was started on lipitor by PCP.      Return in about 6 months (around 10/29/2022).    I have spent 42 minutes of reviewing records and face to face time with the patient with more than 50% spent counseling and coordinating care.       I have  personally reviewed the reports and images of labs, radiological studies, cardiac studies including ECG's and telemetry, current and old medical records. The note was completed using EMR and Dragon dictation system. Every effort was made to ensure accuracy; however, inadvertent computerized transcription errors may be present.    All questions and concerns were addressed to the patient/family. Alternatives to my treatment were discussed.     I would like to thank you for providing me the opportunity to participate in the care of your patient. If you have any questions, please do not hesitate to contact me.     Hervey Ard, MD, [facility], [place]  [address]  [place] [address]  Main Office Phone: [phone]  Fax: [phone]

## 2022-05-09 ENCOUNTER — Encounter

## 2022-05-09 MED ORDER — GABAPENTIN 300 MG PO CAPS
300 | ORAL_CAPSULE | ORAL | 0 refills | Status: AC
Start: 2022-05-09 — End: 2024-07-09

## 2022-05-09 NOTE — Telephone Encounter (Signed)
Requested Prescriptions     Pending Prescriptions Disp Refills    gabapentin (NEURONTIN) 300 MG capsule [Pharmacy Med Name: GABAPENTIN 300 MG CAP *WO*] 90 capsule 0     Sig: TAKE ONE CAPSULE BY MOUTH 3 TIMES DAILY       Last Clinic Visit:  11/08/2021     Next Clinic Appointment:  Visit date not found

## 2022-05-28 ENCOUNTER — Encounter

## 2022-05-31 ENCOUNTER — Encounter

## 2022-06-03 ENCOUNTER — Encounter

## 2022-06-04 ENCOUNTER — Encounter

## 2022-06-04 ENCOUNTER — Inpatient Hospital Stay: Admit: 2022-06-04 | Discharge: 2022-06-04 | Payer: MEDICARE | Primary: Family Medicine

## 2022-06-04 DIAGNOSIS — D472 Monoclonal gammopathy: Secondary | ICD-10-CM

## 2022-06-04 LAB — CBC WITH AUTO DIFFERENTIAL
Basophils %: 1 %
Basophils Absolute: 0 10*3/uL (ref 0.0–0.2)
Eosinophils %: 1.3 %
Eosinophils Absolute: 0.1 10*3/uL (ref 0.0–0.6)
Hematocrit: 48.8 % — ABNORMAL HIGH (ref 36.0–48.0)
Hemoglobin: 15.8 g/dL (ref 12.0–16.0)
Lymphocytes %: 42.1 %
Lymphocytes Absolute: 1.6 10*3/uL (ref 1.0–5.1)
MCH: 27.8 pg (ref 26.0–34.0)
MCHC: 32.4 g/dL (ref 31.0–36.0)
MCV: 85.8 fL (ref 80.0–100.0)
MPV: 10.2 fL (ref 5.0–10.5)
Monocytes %: 25.5 %
Monocytes Absolute: 1 10*3/uL (ref 0.0–1.3)
Neutrophils %: 30.1 %
Neutrophils Absolute: 1.2 10*3/uL — ABNORMAL LOW (ref 1.7–7.7)
PLATELET SLIDE REVIEW: DECREASED
Platelets: 99 10*3/uL — ABNORMAL LOW (ref 135–450)
RBC: 5.69 M/uL — ABNORMAL HIGH (ref 4.00–5.20)
RDW: 14.1 % (ref 12.4–15.4)
WBC: 3.9 10*3/uL — ABNORMAL LOW (ref 4.0–11.0)

## 2022-06-04 LAB — PROTIME-INR
INR: 1.03 (ref 0.84–1.16)
Protime: 13.5 s (ref 11.5–14.8)

## 2022-06-04 LAB — APTT: aPTT: 27.8 s (ref 22.7–35.9)

## 2022-06-04 MED ORDER — FUROSEMIDE 20 MG PO TABS
20 MG | ORAL_TABLET | Freq: Every day | ORAL | 1 refills | Status: AC
Start: 2022-06-04 — End: 2022-07-04

## 2022-06-04 MED ORDER — FENTANYL CITRATE (PF) 100 MCG/2ML IJ SOLN
100 MCG/2ML | INTRAMUSCULAR | Status: AC
Start: 2022-06-04 — End: ?

## 2022-06-04 MED ORDER — MIDAZOLAM HCL 2 MG/2ML IJ SOLN
2 MG/ML | INTRAMUSCULAR | Status: AC
Start: 2022-06-04 — End: ?

## 2022-06-04 MED ORDER — MIDAZOLAM HCL 2 MG/2ML IJ SOLN
2 MG/ML | INTRAMUSCULAR | Status: AC | PRN
Start: 2022-06-04 — End: 2022-06-04
  Administered 2022-06-04: 17:00:00 1 via INTRAVENOUS

## 2022-06-04 MED ORDER — LIDOCAINE HCL (PF) 1 % IJ SOLN
1 % | INTRAMUSCULAR | Status: AC | PRN
Start: 2022-06-04 — End: 2022-06-04
  Administered 2022-06-04: 17:00:00 10 via INTRADERMAL

## 2022-06-04 MED ORDER — FENTANYL CITRATE (PF) 100 MCG/2ML IJ SOLN
100 MCG/2ML | Freq: Once | INTRAMUSCULAR | Status: AC
Start: 2022-06-04 — End: 2022-06-04
  Administered 2022-06-04: 18:00:00 50 ug via INTRAVENOUS

## 2022-06-04 MED ORDER — MIDAZOLAM HCL 2 MG/2ML IJ SOLN
2 MG/ML | Freq: Once | INTRAMUSCULAR | Status: AC
Start: 2022-06-04 — End: 2022-06-04
  Administered 2022-06-04: 18:00:00 1 mg via INTRAVENOUS

## 2022-06-04 MED ORDER — BUPIVACAINE HCL (PF) 0.25 % IJ SOLN
0.25 % | INTRAMUSCULAR | Status: AC
Start: 2022-06-04 — End: ?

## 2022-06-04 MED ORDER — FENTANYL CITRATE (PF) 100 MCG/2ML IJ SOLN
100 MCG/2ML | INTRAMUSCULAR | Status: AC | PRN
Start: 2022-06-04 — End: 2022-06-04
  Administered 2022-06-04: 17:00:00 50 via INTRAVENOUS

## 2022-06-04 MED ORDER — BUPIVACAINE HCL 0.25 % IJ SOLN
0.25 % | INTRAMUSCULAR | Status: AC | PRN
Start: 2022-06-04 — End: 2022-06-04
  Administered 2022-06-04: 17:00:00 10 via INTRADERMAL

## 2022-06-04 MED ORDER — LIDOCAINE HCL 1% INJ (MIXTURES ONLY)
1 % | INTRAMUSCULAR | Status: AC
Start: 2022-06-04 — End: ?

## 2022-06-04 MED ORDER — HEPARIN NA (PORK) LOCK FLSH PF 100 UNIT/ML IV SOLN
100 UNIT/ML | INTRAVENOUS | Status: AC | PRN
Start: 2022-06-04 — End: 2022-06-04
  Administered 2022-06-04: 17:00:00 100 via INTRAVENOUS

## 2022-06-04 MED ORDER — HEPARIN NA (PORK) LOCK FLSH PF 100 UNIT/ML IV SOLN
100 UNIT/ML | INTRAVENOUS | Status: AC
Start: 2022-06-04 — End: ?

## 2022-06-04 MED FILL — BUPIVACAINE HCL (PF) 0.25 % IJ SOLN: 0.25 % | INTRAMUSCULAR | Qty: 10

## 2022-06-04 MED FILL — HEPARIN NA (PORK) LOCK FLSH PF 100 UNIT/ML IV SOLN: 100 UNIT/ML | INTRAVENOUS | Qty: 5

## 2022-06-04 MED FILL — MIDAZOLAM HCL 2 MG/2ML IJ SOLN: 2 MG/ML | INTRAMUSCULAR | Qty: 2

## 2022-06-04 MED FILL — FENTANYL CITRATE (PF) 100 MCG/2ML IJ SOLN: 100 MCG/2ML | INTRAMUSCULAR | Qty: 2

## 2022-06-04 MED FILL — LIDOCAINE HCL (PF) 1 % IJ SOLN: 1 % | INTRAMUSCULAR | Qty: 30

## 2022-06-04 NOTE — Telephone Encounter (Signed)
Last Office Visit 04/26/2022  Next Office Visit 08/27/2022

## 2022-06-04 NOTE — Discharge Instructions (Signed)
Discharge Instructions for Bone Marrow Biopsy  Interventional Radiologist performing procedure Erin Hearing NP  Home Care     Remove the bandage after a day or two.  May apply band-aid if needed.   Diet     Resume your normal diet.    Physical Activity     Rest for the first 24-48 hours.      No driving for 24 hours if you have received analgesia/sedation   May shower.  Do not submerge biopsy site in water (VZ:CHYI, hot tub, swimming pool) for one week to prevent infection    Avoid strenuous activities for one week (excercise, heavy lifting etc.)       Call Your Doctor If Any of the Following Occurs   Signs of infection, including fever and chills     Redness, swelling, increasing pain, excessive bleeding, or any discharge from the incision site     Pain that you can't control with over the counter medications   Cough, shortness of breath, or chest pain     Pain when taking a deep breath     You feel your heart rate is fast   In case of an emergency, call 911 immediately.       [place]  A responsible person should be with you for the next 24 hours.       MEDICATION INSTRUCTIONS:  _0 Resume your prescribed medications  _1 You may take a non-prescription "headache remedy", preferably one that does not contain aspirin.  _2  Keep your medication list updated and carry it with you in case of emergencies.    IF YOU TAKE ANTI-COAGULANTS:  You may typically re-start your blood thinning drugs the day after your procedure UNLESS directed otherwise by the doctor who prescribes the drug for you. Some common blood thinning drugs include  Anti-inflammatory drugs (motrin, aleve)     Aspirin  Anti-coagulants such as plavix (clopidogrel) or coumadin (warfarin)      FOLLOW-UP CARE:  Follow up with your doctor for results and appointment            The above instructions were reviewed with patient/significant other.  The following additional patient specific information was reviewed  with the patient/significant other:             Radiology Department  [place]:  [phone] M-F 7AM-6PM     If you smoke STOP. We care about your health!

## 2022-06-04 NOTE — Progress Notes (Signed)
Pt arrived to [facility] 7 from CT. Pt stated she is in 8/10 pain, refer to flow sheet for documentation. RN notified procedure Dr. Berneice Gandy pt pain. Dr ordered versed and fentanyl. This RN gave medications per [facility] [place] orders. Surgical site is C/D/I. Pt denies nausea. Offered pt food. Pt is satisfied with care. Pt ordered d/c time at 1530. This RN called and notified Stanton Kidney, daughter, of pt d/c time.

## 2022-06-04 NOTE — Progress Notes (Signed)
Ambulatory Surgery/Procedure Discharge Note    Vitals:    06/04/22 1500   BP: (!) 149/81   Pulse: 71   Resp: 18   Temp:    SpO2: 100%     Patient meets criteria for discharge per [place] score.    No intake/output data recorded.    Restroom use offered before discharge.  Yes    Pain assessment:  present - adequately treated  Pain Level: 5    Pt and daughter states "ready to go home". Pt alert and oriented x4. IV removed. Denies N/V. Lower back biopsy site- dressing C,D,I. No hematoma, bleeding, bruising or swelling noted. Pt tolerating po intake. Discharge instructions given to pt and daughter with pt permission. Pt and daughter verbalized understanding of all instructions. Left with all belongings and discharge instructions. No new prescriptions.     Patient discharged to home/self care. Patient discharged via wheel chair by transporter to waiting family.

## 2022-06-04 NOTE — Other (Signed)
IMAGING SERVICES NURSING PROGRESS NOTE    Procedure:  BM Bx  June 04, 2022  Gabrielle Wells      Allergies:    Allergies   Allergen Reactions    Vancomycin Other (See Comments)     Drug induced NEUTROPENIA    Aspirin Other (See Comments)     Stomach bleeding    Ibuprofen      Stomach bleeding    Mushroom Extract Complex Hives and Itching    Shellfish Allergy Hives and Itching    Shellfish-Derived Products Hives and Itching    Sulfa Antibiotics Hives, Itching and Nausea And Vomiting    Sulfasalazine Hives and Itching    Acetaminophen Itching    Betadine [Povidone Iodine]     Ciprofloxacin Other (See Comments)     Made "bottom" raw    Coconut (Cocos Nucifera)     Codeine     Fish-Derived Products     Iodides     Iodine     Metronidazole Other (See Comments)     Made "bottom" raw    Other      detergents    Sulfacetamide     Amoxicillin-Pot Clavulanate Itching     Has never had problems with amoxicillin/ penicillin    HAS TOLERATED ZOSYN WITH ZERO PROBLEMS    Coconut Flavor Itching    Eggs Or Egg-Derived Products Nausea And Vomiting    Iodinated Contrast Media Hives     Takes Benadryl 78m PO before receiving iodinated contrast       Vitals:    06/04/22 1315   BP: (!) 155/92   Pulse: 69   Resp: 18   Temp:    SpO2: 98%       Recent lab work reviewed with MD: yes   Procedure explained to patient by MD: yes   Informed consent obtained:yes  Family with patient: Yes    Mental Status:  Normal  Readiness to learn:  Yes  Barriers to learning: No    Pain Assessment Pre-Procedure:  Pain Present:  yes  Pain Score:  6  Pain Quality/Description:  Aching    Time out Procedure Verification with:  [x]  RN  [x]  Physician  [x]  Patient  [x]  Other: CT Technologist  Procedure site marked, if applicable:  Yes    Note: Patient arrived A & O x 4, denies pain, breathing easily on room air, Spoke to ECharma IgoNP prior to procedure.  Procedural sedation:  Fentanyl:  50 mcg  Versed:    1 mg  Post Procedureal Note:  Patient tolerated  procedure well.  Breathing easily on room air.  Report given to [facility]RN.  Patient transported in stable conditon to room 7.    Pain Assessment Post-Procedure:  Pain Present:  no  Pain Score:  0  Pain Quality/Description:  none    Plan of Care Goals:  Safety measures met:  Yes  Patient understands explanation of procedure:  Yes    Time in:  1300  Time out:  [place] RN.  R.N. 06/04/2022

## 2022-06-04 NOTE — Pre Sedation (Signed)
Patient:  Gabrielle Wells   DOB:   June 20, 1951      Relevant clinical history, particularly as it involves the pending procedure, was reviewed and discussed.    The procedure including risks, benefits, and alternatives was discussed at length with the patient (or designated family member) and all questions were answered.  Informed consent to proceed with the procedure was given.    Vital signs were monitored and documented by the Radiology nurse.    Targeted physical examination  Heart : regular rate and rhythm  Lungs : clear, breathing easily  Condition : stable    Radiology nurses notes reviewed and agreed.    Past Medical History:        Diagnosis Date    Arthritis     Asthma     CHF (congestive heart failure) ([place])     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    s/i I&D and removaal of hardware    GERD (gastroesophageal reflux disease)     HTN (hypertension)     Hyperlipidemia     IBS (irritable bowel syndrome)     Incontinence of urine     Lumbar radiculopathy     Morbid obesity with BMI of 45.0-49.9, adult (HCC)     Muscle fasciculation     Neuropathic pain     back 2/2 to spinal surgeries    SBO (small bowel obstruction) ([place]) 07/1999    with partial colectomy    Spinal fracture of T8 vertebra ([place])     s/p fusion     Surgical site infection     back, spinal hardware     Urinary incontinence        Past Surgical History:           Procedure Laterality Date    ABDOMEN SURGERY N/A 05/16/2021    ABDOMINAL FAT PAD EXCISIONAL BIOPSY performed by Mikael Spray, MD at [place]  2010    APPENDECTOMY      BREAST LUMPECTOMY Bilateral     benign lesions    CARDIAC CATHETERIZATION  2012    CHOLECYSTECTOMY      COLONOSCOPY      COLONOSCOPY  05/26/2020    COLONOSCOPY POLYPECTOMY SNARE/COLD BIOPSY performed by Alphonse Guild, MD at [place]  05/26/2020    COLONOSCOPY WITH BIOPSY performed by Alphonse Guild, MD at [place]  01/03/2021    CT BONE MARROW BIOPSY  01/03/2021 [facility] CT SCAN    HYSTERECTOMY, TOTAL ABDOMINAL (CERVIX REMOVED)      TOTAL KNEE ARTHROPLASTY Bilateral 2009    TOTAL SHOULDER ARTHROPLASTY W/ DISTAL CLAVICLE EXCISION Right 2010    UPPER GASTROINTESTINAL ENDOSCOPY N/A 05/26/2020    EGD BIOPSY performed by Alphonse Guild, MD at [place] 5 YEARS  07/28/2018    XR MIDLINE EQUAL OR GREATER THAN 5 YEARS 07/28/2018       Allergies:  Vancomycin, Aspirin, Ibuprofen, Mushroom extract complex, Shellfish allergy, Shellfish-derived products, Sulfa antibiotics, Sulfasalazine, Acetaminophen, Betadine [povidone iodine], Ciprofloxacin, Coconut (cocos nucifera), Codeine, Fish-derived products, Iodides, Iodine, Metronidazole, Other, Sulfacetamide, Amoxicillin-pot clavulanate, Coconut flavor, Eggs or egg-derived products, and Iodinated contrast media    Medications:   Home Meds  Current Outpatient Medications on File Prior to Encounter   Medication Sig Dispense Refill    gabapentin (NEURONTIN) 300 MG capsule TAKE ONE CAPSULE BY  MOUTH 3 TIMES DAILY 90 capsule 0    carvedilol (COREG) 25 MG tablet TAKE ONE TABLET BY MOUTH TWICE DAILY 180 tablet 3    valsartan (DIOVAN) 320 MG tablet Take 1 tablet by mouth daily 90 tablet 3    empagliflozin (JARDIANCE) 10 MG tablet Take 1 tablet by mouth daily 90 tablet 3    atorvastatin (LIPITOR) 40 MG tablet Take 1 tablet by mouth daily 90 tablet 1    Cholecalciferol (VITAMIN D3) 25 MCG TABS TAKE TWO TABLETS BY MOUTH DAILY 60 tablet 2    albuterol sulfate HFA (VENTOLIN HFA) 108 (90 Base) MCG/ACT inhaler Inhale 2 puffs into the lungs 4 times daily as needed for Wheezing 18 g 0    tiZANidine (ZANAFLEX) 2 MG tablet Take 1 tablet by mouth nightly as needed (muscle spasms) 30 tablet 1    ondansetron (ZOFRAN-ODT) 4 MG disintegrating tablet DISSOLVE 1 TABLET ON THE TONGUE EVERY 8 HOURS AS NEEDED FOR NAUSEA 21 tablet 0    Handicap Placard MISC by Does not apply route 07/05/2021-07/05/2024 1 each 0    omeprazole  (PRILOSEC) 20 MG delayed release capsule Take 1 capsule by mouth every morning (before breakfast) 90 capsule 1    acetaminophen (TYLENOL) 500 MG tablet Take 1 tablet by mouth every 6 hours as needed for Pain       No current facility-administered medications on file prior to encounter.       Current Meds  No current facility-administered medications for this encounter.        ASA 3 - Patient with moderate systemic disease with functional limitations    II (soft palate, uvula, fauces visible)    Activity:  2 - Able to move 4 extremities voluntarily on command  Respiration:  2 - Able to breathe deeply and cough freely  Circulation:  2 - BP+/- 23mHg of normal  Consciousness:  2 - Fully awake  Oxygen Saturation (color):  2 - Able to maintain oxygen saturation >92% on room air    Sedation : Moderate sedation planned    HPI / Treatment plan : CT guided bone marrow biopsy      Electronically signed by ESabino Gasser APRN - CNP on 06/04/22 at 1:45 PM EDT

## 2022-06-10 ENCOUNTER — Inpatient Hospital Stay: Admit: 2022-06-10 | Payer: MEDICARE | Primary: Family Medicine

## 2022-06-10 DIAGNOSIS — M5030 Other cervical disc degeneration, unspecified cervical region: Secondary | ICD-10-CM

## 2022-06-12 NOTE — Unmapped (Signed)
Called patient to confirm appt 10/27 - states she would like reschedule as she just had some procedures done. Rescheduled to next available 11/21 @ 2:00pm.   Patient confirmed new date and time. Appt reminder mailed to confirmed address.

## 2022-06-14 ENCOUNTER — Ambulatory Visit: Payer: Medicare (Managed Care)

## 2022-06-14 ENCOUNTER — Encounter

## 2022-06-17 ENCOUNTER — Encounter

## 2022-06-17 MED ORDER — FUROSEMIDE 20 MG PO TABS
20 | ORAL_TABLET | Freq: Every day | ORAL | 1 refills | Status: DC
Start: 2022-06-17 — End: 2024-03-11

## 2022-06-17 NOTE — Telephone Encounter (Signed)
Last Office Visit 04/26/2022  Next Office Visit 08/27/2022

## 2022-06-21 ENCOUNTER — Encounter

## 2022-06-21 NOTE — Telephone Encounter (Signed)
Requested Prescriptions     Pending Prescriptions Disp Refills    gabapentin (NEURONTIN) 300 MG capsule [Pharmacy Med Name: GABAPENTIN 300 MG CAP *WO*] 90 capsule 0     Sig: TAKE ONE CAPSULE BY MOUTH 3 TIMES DAILY       Last Clinic Visit:  11/08/2021     Next Clinic Appointment:  Visit date not found

## 2022-07-04 ENCOUNTER — Encounter

## 2022-07-05 LAB — MISCELLANEOUS LAB TEST #1

## 2022-07-09 ENCOUNTER — Ambulatory Visit: Payer: Medicare (Managed Care)

## 2022-07-09 NOTE — Unmapped (Deleted)
[facility]   Return Office Visit  Nurse Practitioner    CHIEF COMPLAINT:  No chief complaint on file.    Rakhi A Leblond is a 71 y.o. female who is referred for the following: Chronic Diarrhea. Ms. Dandridge has had diarrhea for years. I feel Imodium made her more constipated than she already was. Her most recent KUB showed a moderate stool burden. I feel she is having constipation with overflow. She will start taking Citrucel and Miralax daily. She should have less episodes of incontinence if we can improve her bowel pattern. If this treatment does not help her Sx we will schedule a colonoscopy at next OV. Patient was agreeable with this plan.     HISTORY OF PRESENT ILLNESS:    Allison A Epling is a 71 y.o. female who is seen in follow up.    Patient was last seen on 03/14/2022 for Constipation, Incontinence.    Currently, she moves her bowels ***. She denies a recent change in bowel habits, hematochezia, melena, change in stool caliber. No unintentional weight loss, abdominal pain.     Denies any dysphagia, odynophagia, reflux, regurgitation, nausea. Denies any prior liver, pancreatic or gallbladder disease.      No history of anemia.  No sleep apnea. No narcotics. No history of anesthesia complications.    CT Abd/Pelvis w/o contrast: 03/01/2022  No acute intra-abdominal or pelvic pathology     Uncomplicated sigmoid diverticulosis.     Fatty infiltration of the liver      Last Colonoscopy: 2021  Colonoscopy on 2021: with Normal terminal ileum and normal mucosa (s/p sessile polyp removal). Negative infx work up on 2021 (Enteric panel+ C diff <0).  Outside records suggest investigations for microscopic colitis vs IBS but results are not known. KUB showed moderate stool burden which suggests possible overflow. We recommend a trial of Miralax.    Upper Endoscopy: never  Family History of Gastrointestinal Malignancy: none       REVIEW OF SYSTEMS:   (For the following ROS, pertinent positives are in bold;  pertinent negatives are in italics.)  CONSTITUTIONAL: fevers, chills, sweats, weight loss, fatigue  CV: chest pain, dyspnea, palpitations  RESP: cough, dyspnea  GI: see HPI    Past Medical History:   Past Medical History:   Diagnosis Date    (HFpEF) heart failure with preserved ejection fraction (CMS-HCC) 12/10/2021    Asthma     CHF (congestive heart failure) (CMS-HCC)     Hypercholesteremia     Hypertension     Renal disorder        Past Surgical History:   No past surgical history on file.    Current Medications:    Meds Reviews and Updated     ALLERGIES:  Vancomycin, Aspirin, Ibuprofen, Mushroom, Shellfish derived, Sulfa (sulfonamide antibiotics), Sulfasalazine, Acetaminophen, Ciprofloxacin, Coconut oil, Metronidazole, Amoxicillin-pot clavulanate, Coconut flavor, Codeine, Egg derived, Iodine, Metrizamide, and Povidone-iodine    SOCIAL HISTORY:    Social History     Socioeconomic History    Marital status: Single     Spouse name: Not on file    Number of children: Not on file    Years of education: Not on file    Highest education level: Not on file   Occupational History    Not on file   Tobacco Use    Smoking status: Never    Smokeless tobacco: Never   Substance and Sexual Activity    Alcohol use: Not Currently    Drug  use: Not Currently    Sexual activity: Not Currently   Other Topics Concern    Not on file   Social History Narrative    Not on file     Social Determinants of Health     Financial Resource Strain: Not on file   Food Insecurity: Not on file   Transportation Needs: Not on file   Physical Activity: Not on file   Stress: Not on file   Social Connections: Not on file   Intimate Partner Violence: Not on file   Housing Stability: Not on file        FAMILY HISTORY:   No family history on file.    PHYSICAL EXAM:   Vital Signs:  There were no vitals taken for this visit.  There is no height or weight on file to calculate BMI.    CONSTITUTIONAL: WD/WN, in NAD  LUNGS: no increased work of breathing    CARDIOVASCULAR: RRR  ABDOMEN: no scars, normal bowel sounds, soft, non-distended, non-tender, no rebound or guarding, no hepatomegaly   MUSCULOSKELETAL: there is no redness, warmth, or swelling of the joints  SKIN: no redness, warmth, or swelling, no rashes and no jaundice    Lab Results   Component Value Date    WBC 4.4 12/12/2021    HGB 13.7 12/12/2021    HCT 42.3 12/12/2021    MCV 85.5 12/12/2021    PLT 83 (L) 12/12/2021     Lab Results   Component Value Date    GLUCOSE 89 12/12/2021    BUN 11 12/12/2021    CREATININE 1.05 12/12/2021    K 4.0 12/12/2021    PHOS 3.9 12/11/2021    ALBUMIN 3.5 12/11/2021    EGFR 56 12/12/2021     Lab Results   Component Value Date    ALKPHOS 48 12/09/2021    ALT 8 12/09/2021    AST 13 12/09/2021    BILITOT 0.7 12/09/2021    ALBUMIN 3.5 12/11/2021    BILIDIRECT <0.2 04/23/2021    PROT 7.6 12/09/2021     No results found for: IRON, TIBC, FERRITIN    DIAGNOSTIC STUDIES REVIEWED:    As Noted in the HPI     ASSESSMENT AND PLAN:  Artina A Fiumara is a 71 y.o. female who is seen in follow up.        Return to Clinic:     This document was dictated using Conservation officer, historic buildings.  While every effort is made to proofread the document as it is being created, please excuse any transcription errors encountered in reading.    Kathrine Haddock, APRN  [facility]   [phone]

## 2022-07-12 ENCOUNTER — Emergency Department: Admit: 2022-07-12 | Payer: MEDICARE | Primary: Family Medicine

## 2022-07-12 ENCOUNTER — Inpatient Hospital Stay
Admit: 2022-07-12 | Discharge: 2022-07-13 | Disposition: A | Discharge status: Home / Self Care | Payer: MEDICARE | Attending: Emergency Medicine

## 2022-07-12 DIAGNOSIS — M545 Low back pain, unspecified: Secondary | ICD-10-CM

## 2022-07-12 LAB — CBC WITH PLATELET AND DIFFERENTIAL
Basophils Absolute: 0 10*3/uL (ref 0.0–0.2)
Basophils Relative: 1.1 %
Eosinophils Absolute: 0 10*3/uL (ref 0.0–0.6)
Eosinophils Relative: 0.7 %
Hematocrit: 48.6 % — ABNORMAL HIGH (ref 36.0–48.0)
Hemoglobin: 15.8 g/dL (ref 12.0–16.0)
Lymphocytes Absolute: 1.6 10*3/uL (ref 1.0–5.1)
Lymphocytes Relative: 37.2 %
MCH: 27.9 pg (ref 26.0–34.0)
MCHC: 32.4 g/dL (ref 31.0–36.0)
MCV: 86 fL (ref 80.0–100.0)
MPV: 10.4 fL (ref 5.0–10.5)
Monocytes Absolute: 1.2 10*3/uL (ref 0.0–1.3)
Monocytes Relative: 29 %
Neutrophils Absolute: 1.4 10*3/uL — ABNORMAL LOW (ref 1.7–7.7)
Neutrophils Relative: 32 %
Platelets: 110 10*3/uL — ABNORMAL LOW (ref 135–450)
RBC: 5.65 M/uL — ABNORMAL HIGH (ref 4.00–5.20)
RDW: 14.3 % (ref 12.4–15.4)
WBC: 4.3 10*3/uL (ref 4.0–11.0)

## 2022-07-12 LAB — BASIC METABOLIC PANEL REFLEX MG
Anion Gap: 12 (ref 3–16)
BUN: 8 mg/dL (ref 7–20)
CO2: 25 mmol/L (ref 21–32)
Calcium: 9.8 mg/dL (ref 8.3–10.6)
Chloride: 105 mmol/L (ref 99–110)
Creatinine: 1 mg/dL (ref 0.6–1.2)
Glucose: 95 mg/dL (ref 70–99)
POC EGFR MDRD Non Af American: 59 — AB (ref >60)
Potassium: 4.1 mmol/L (ref 3.5–5.1)
Sodium: 142 mmol/L (ref 136–145)

## 2022-07-12 LAB — URINALYSIS, WITH MICROSCOPIC
Bilirubin, UA: NEGATIVE
Blood, UA: NEGATIVE
Glucose, UA: >=1000 mg/dL — AB
Ketones, UA: NEGATIVE mg/dL
Leukocytes, UA: NEGATIVE
Nitrite, UA: NEGATIVE
Protein, UA: NEGATIVE mg/dL
Specific Gravity, UA: 1.025 (ref 1.005–1.030)
Urobilinogen, UA: 0.2 U/dL (ref <2.0)
pH, UA: 6 (ref 5.0–8.0)

## 2022-07-12 LAB — LACTIC ACID
Lactate: 1.1 mmol/L (ref 0.4–2.0)
Lactic Acid: 1.1 mmol/L (ref 0.4–2.0)

## 2022-07-12 LAB — BLOOD GAS, VENOUS
Base Excess, Ven: 1.1 mmol/L (ref <=3.0)
Base Excess, Ven: 1.1 mmol/L (ref <=3.0)
CO2 Content, Venous: 29 mmol/L
Carboxyhemoglobin, Venous: 1.1 % (ref 0.0–1.5)
Carboxyhemoglobin: 1.1 % (ref 0.0–1.5)
HCO3, Ven: 27.7 mmol/L (ref 24.0–28.0)
HCO3, Venous: 27.7 mmol/L (ref 24.0–28.0)
Hemoglobin, Ven, Reduced: 33.3 %
MetHgb, Ven: 0.3 % (ref 0.0–1.5)
Methemoglobin, Venous: 0.3 % (ref 0.0–1.5)
O2 Sat, Ven: 66 %
O2 Sat, Ven: 66 %
Reduced hemoglobin, Venous: 33.3 %
TC02 (Calc), Ven: 29 mmol/L
pCO2, Ven: 50 mm[Hg] (ref 41.0–51.0)
pCO2, Ven: 50 mmHg (ref 41.0–51.0)
pH, Ven: 7.352 (ref 7.350–7.450)
pH, Ven: 7.352 (ref 7.350–7.450)
pO2, Ven: 37.1 mm[Hg] (ref 25.0–40.0)
pO2, Ven: 37.1 mmHg (ref 25.0–40.0)

## 2022-07-12 LAB — URINALYSIS WITH MICROSCOPIC
Bilirubin Urine: NEGATIVE
Blood, Urine: NEGATIVE
Glucose, Ur: >=1000 mg/dL — AB
Ketones, Urine: NEGATIVE mg/dL
Leukocyte Esterase, Urine: NEGATIVE
Nitrite, Urine: NEGATIVE
Protein, UA: NEGATIVE mg/dL
Specific Gravity, UA: 1.025 (ref 1.005–1.030)
Urobilinogen, Urine: 0.2 E.U./dL (ref <2.0)
pH, UA: 6 (ref 5.0–8.0)

## 2022-07-12 LAB — CBC WITH AUTO DIFFERENTIAL
Basophils %: 1.1 %
Basophils Absolute: 0 10*3/uL (ref 0.0–0.2)
Eosinophils %: 0.7 %
Eosinophils Absolute: 0 10*3/uL (ref 0.0–0.6)
Hematocrit: 48.6 % — ABNORMAL HIGH (ref 36.0–48.0)
Hemoglobin: 15.8 g/dL (ref 12.0–16.0)
Lymphocytes %: 37.2 %
Lymphocytes Absolute: 1.6 10*3/uL (ref 1.0–5.1)
MCH: 27.9 pg (ref 26.0–34.0)
MCHC: 32.4 g/dL (ref 31.0–36.0)
MCV: 86 fL (ref 80.0–100.0)
MPV: 10.4 fL (ref 5.0–10.5)
Monocytes %: 29 %
Monocytes Absolute: 1.2 10*3/uL (ref 0.0–1.3)
Neutrophils %: 32 %
Neutrophils Absolute: 1.4 10*3/uL — ABNORMAL LOW (ref 1.7–7.7)
Platelets: 110 10*3/uL — ABNORMAL LOW (ref 135–450)
RBC: 5.65 M/uL — ABNORMAL HIGH (ref 4.00–5.20)
RDW: 14.3 % (ref 12.4–15.4)
WBC: 4.3 10*3/uL (ref 4.0–11.0)

## 2022-07-12 LAB — BASIC METABOLIC PANEL W/ REFLEX TO MG FOR LOW K
Anion Gap: 12 (ref 3–16)
BUN: 8 mg/dL (ref 7–20)
CO2: 25 mmol/L (ref 21–32)
Calcium: 9.8 mg/dL (ref 8.3–10.6)
Chloride: 105 mmol/L (ref 99–110)
Creatinine: 1 mg/dL (ref 0.6–1.2)
Est, Glom Filt Rate: 59 — AB (ref >60)
Glucose: 95 mg/dL (ref 70–99)
Potassium reflex Magnesium: 4.1 mmol/L (ref 3.5–5.1)
Sodium: 142 mmol/L (ref 136–145)

## 2022-07-12 MED ORDER — MORPHINE SULFATE 4 MG/ML IV SOLN
4 MG/ML | Freq: Once | INTRAVENOUS | Status: DC
Start: 2022-07-12 — End: 2022-07-12

## 2022-07-12 MED ORDER — BARIUM SULFATE 2 % PO SUSP
2 % | Freq: Once | ORAL | Status: AC | PRN
Start: 2022-07-12 — End: 2022-07-12
  Administered 2022-07-12: 23:00:00 900 mL via ORAL

## 2022-07-12 MED ORDER — ONDANSETRON HCL 4 MG/2ML IJ SOLN
4 MG/2ML | Freq: Once | INTRAMUSCULAR | Status: AC
Start: 2022-07-12 — End: 2022-07-12
  Administered 2022-07-12: 22:00:00 4 mg via INTRAVENOUS

## 2022-07-12 MED ORDER — MORPHINE SULFATE 4 MG/ML IV SOLN
4 MG/ML | Freq: Once | INTRAVENOUS | Status: AC
Start: 2022-07-12 — End: 2022-07-12
  Administered 2022-07-12: 22:00:00 4 mg via INTRAVENOUS

## 2022-07-12 MED FILL — MORPHINE SULFATE 4 MG/ML IJ SOLN: 4 MG/ML | INTRAMUSCULAR | Qty: 1

## 2022-07-12 MED FILL — ONDANSETRON HCL 4 MG/2ML IJ SOLN: 4 MG/2ML | INTRAMUSCULAR | Qty: 2

## 2022-07-12 NOTE — ED Provider Notes (Incomplete)
THE [facility]  EMERGENCY DEPARTMENT ENCOUNTER          ATTENDING PHYSICIAN NOTE       Date of evaluation: 07/12/2022    Chief Complaint     Back Pain      History of Present Illness     Gabrielle Wells is a 71 y.o. 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  She also has a history of recurrent small bowel obstructions in the past, and has been told that she could develop another small bowel obstruction at any time.  She presents to the emergency department with worsening low back pain in the last several days, more prominent on the right, worse with any changes in position, which today has begun radiating into the sides of the abdomen and anteriorly.  For this reason, with extension into the abdomen, as well as an episode of vomiting, the patient began to experience concern that her symptoms could be related to a small bowel obstruction.  It is for this reason that she presents to the emergency department for evaluation.    Patient states that she has had difficulties with low back pain for quite some time, and is not currently under the care of a spine specialist or pain management physician here in [place].  Her primary care provider prescribes her cyclobenzaprine, and she takes 5 to 10 mg as needed, but does not feel that it helps at all.  She has been told by her kidney doctor that because of her chronic kidney disease she cannot take any anti-inflammatory medications, and also should not be taking opiate pain medicines.    Review of Systems     Review of Systems   All other systems reviewed and are negative.      Past Medical, Surgical, Family, and Social History     She has a past medical history of Arthritis, Asthma, CHF (congestive heart failure) (HCC), Epidural abscess, GERD (gastroesophageal reflux disease), HTN (hypertension), Hyperlipidemia,  IBS (irritable bowel syndrome), Incontinence of urine, Lumbar radiculopathy, Morbid obesity with BMI of 45.0-49.9, adult (HCC), Muscle fasciculation, Neuropathic pain, SBO (small bowel obstruction) (HCC), Spinal fracture of T8 vertebra (HCC), Surgical site infection, and Urinary incontinence.  She has a past surgical history that includes Appendectomy; Breast lumpectomy (Bilateral); Hysterectomy, total abdominal; Cholecystectomy; Total knee arthroplasty (Bilateral, 2009); Cardiac catheterization (2012); Total shoulder arthroplasty w/ distal clavicle excision (Right, 2010); Abdominal hernia repair (2010); Colonoscopy; Upper gastrointestinal endoscopy (N/A, 05/26/2020); Colonoscopy (05/26/2020); Colonoscopy (05/26/2020); CT BIOPSY BONE MARROW (01/03/2021); Abdomen surgery (N/A, 05/16/2021); and XR MIDLINE EQUAL OR GREATER THAN 5 YEARS (07/28/2018).  Her family history includes Cancer in her paternal grandmother.  She reports that she has never smoked. She has never used smokeless tobacco. She reports that she does not drink alcohol and does not use drugs.    Medications     Previous Medications    ACETAMINOPHEN (TYLENOL) 500 MG TABLET    Take 1 tablet by mouth every 6 hours as needed for Pain    ALBUTEROL SULFATE HFA (VENTOLIN HFA) 108 (90 BASE) MCG/ACT INHALER    Inhale 2 puffs into the lungs 4 times daily as needed for Wheezing    ATORVASTATIN (LIPITOR) 40 MG TABLET    Take 1 tablet by mouth daily    CARVEDILOL (COREG) 25 MG TABLET    TAKE ONE TABLET BY MOUTH  TWICE DAILY    CHOLECALCIFEROL (VITAMIN D3) 25 MCG TABS    TAKE TWO TABLETS BY MOUTH DAILY    EMPAGLIFLOZIN (JARDIANCE) 10 MG TABLET    Take 1 tablet by mouth daily    FUROSEMIDE (LASIX) 20 MG TABLET    Take 2 tablets by mouth daily Take 80 mg if wt goes up 3-5lbs    GABAPENTIN (NEURONTIN) 300 MG CAPSULE    TAKE ONE CAPSULE BY MOUTH 3 TIMES DAILY    HANDICAP PLACARD MISC    by Does not apply route 07/05/2021-07/05/2024    OMEPRAZOLE (PRILOSEC) 20 MG DELAYED  RELEASE CAPSULE    Take 1 capsule by mouth every morning (before breakfast)    ONDANSETRON (ZOFRAN-ODT) 4 MG DISINTEGRATING TABLET    DISSOLVE 1 TABLET ON THE TONGUE EVERY 8 HOURS AS NEEDED FOR NAUSEA    TIZANIDINE (ZANAFLEX) 2 MG TABLET    Take 1 tablet by mouth nightly as needed (muscle spasms)    VALSARTAN (DIOVAN) 320 MG TABLET    Take 1 tablet by mouth daily       Allergies     She is allergic to vancomycin, aspirin, ibuprofen, mushroom extract complex, shellfish allergy, shellfish-derived products, sulfa antibiotics, sulfasalazine, acetaminophen, betadine [povidone iodine], ciprofloxacin, coconut (cocos nucifera), codeine, fish-derived products, iodides, iodine, metronidazole, other, sulfacetamide, amoxicillin-pot clavulanate, coconut flavor, eggs or egg-derived products, and iodinated contrast media.    Physical Exam     INITIAL VITALS: BP: 135/87, Temp: 97.7 F (36.5 C), Pulse: 73, Respirations: 16, SpO2: 97 %     General: Overall well-appearing older patient, BMI 46.  She is in obvious discomfort, but in NAD.    HEENT: Pupils are equal, round, and reactive to light. Extraocular muscles are intact.  Conjunctivae are clear and moist. No redness or drainage from the eyes. No drainage from the nose. The oropharynx appeared to be normal.    Neck: Supple, with full range of motion. No midline C-spine tenderness to palpation, crepitus, or step-offs.    Back: No focal midline T or L spine tenderness to palpation, crepitus, or step-offs.  Patient has extensive thoracic and lumbar scarring from prior surgical intervention.  She has diffuse discomfort throughout the back, but has most prominent tenderness in the right low lumbar paraspinous musculature, into the upper buttock on the right.    Cardiovascular: Normal S1-S2 without murmur rub or gallop. 2+ radial pulses bilaterally.     Respiratory: Unlabored breathing with equal chest rise and fall.     Abdomen: Significantly protuberant, but soft and nondistended.   There is mild diffuse discomfort to palpation, without guarding or rebound tenderness. No masses or hepatosplenomegaly.    Skin: Warm and dry, without rashes or ecchymoses, lacerations or abrasions.     Neuro: Alert and oriented x3. No focal neurologic deficits are noted.    Extremities: Warm and well-perfused, without clubbing, cyanosis, or edema. The patient moves all extremities equally.    Psych: The patient's mood and affect are generally within normal limits for their presentation.      Diagnostic Results       RADIOLOGY:  CT ABDOMEN PELVIS WO CONTRAST Additional Contrast? Oral   Final Result      1.  No acute abnormality on noncontrast CT of the abdomen and pelvis.         CT LUMBAR RECONSTRUCTION WO POST PROCESS   Final Result      1.  No evidence of acute fracture in the lumbar spine.  2.  Severe L2-3 spinal canal stenosis and moderate multilevel neural foraminal   narrowing.   3.  L4-5 interbody fusion and L3-4 through L5-S1 laminectomies.             LABS:   Results for orders placed or performed during the hospital encounter of 07/12/22   CBC with Auto Differential   Result Value Ref Range    WBC 4.3 4.0 - 11.0 K/uL    RBC 5.65 (H) 4.00 - 5.20 M/uL    Hemoglobin 15.8 12.0 - 16.0 g/dL    Hematocrit 10.2 (H) 36.0 - 48.0 %    MCV 86.0 80.0 - 100.0 fL    MCH 27.9 26.0 - 34.0 pg    MCHC 32.4 31.0 - 36.0 g/dL    RDW 72.5 36.6 - 44.0 %    Platelets 110 (L) 135 - 450 K/uL    MPV 10.4 5.0 - 10.5 fL    Neutrophils % 32.0 %    Lymphocytes % 37.2 %    Monocytes % 29.0 %    Eosinophils % 0.7 %    Basophils % 1.1 %    Neutrophils Absolute 1.4 (L) 1.7 - 7.7 K/uL    Lymphocytes Absolute 1.6 1.0 - 5.1 K/uL    Monocytes Absolute 1.2 0.0 - 1.3 K/uL    Eosinophils Absolute 0.0 0.0 - 0.6 K/uL    Basophils Absolute 0.0 0.0 - 0.2 K/uL   Basic Metabolic Panel w/ Reflex to MG   Result Value Ref Range    Sodium 142 136 - 145 mmol/L    Potassium reflex Magnesium 4.1 3.5 - 5.1 mmol/L    Chloride 105 99 - 110 mmol/L    CO2 25 21 -  32 mmol/L    Anion Gap 12 3 - 16    Glucose 95 70 - 99 mg/dL    BUN 8 7 - 20 mg/dL    Creatinine 1.0 0.6 - 1.2 mg/dL    Est, Glom Filt Rate 59 (A) >60    Calcium 9.8 8.3 - 10.6 mg/dL   Blood gas, venous (Lab)   Result Value Ref Range    pH, Ven 7.352 7.350 - 7.450    pCO2, Ven 50.0 41.0 - 51.0 mmHg    pO2, Ven 37.1 25.0 - 40.0 mmHg    HCO3, Venous 27.7 24.0 - 28.0 mmol/L    Base Excess, Ven 1.1 -2.0 - 3.0 mmol/L    O2 Sat, Ven 66 Not established %    Carboxyhemoglobin 1.1 0.0 - 1.5 %    MetHgb, Ven 0.3 0.0 - 1.5 %    TC02 (Calc), Ven 29 mmol/L    Hemoglobin, Ven, Reduced 33.30 %   Lactic Acid   Result Value Ref Range    Lactic Acid 1.1 0.4 - 2.0 mmol/L   Urinalysis with Microscopic   Result Value Ref Range    Color, UA Yellow Straw/Yellow    Clarity, UA Clear Clear    Glucose, Ur >=1000 (A) Negative mg/dL    Bilirubin Urine Negative Negative    Ketones, Urine Negative Negative mg/dL    Specific Gravity, UA 1.025 1.005 - 1.030    Blood, Urine Negative Negative    pH, UA 6.0 5.0 - 8.0    Protein, UA Negative Negative mg/dL    Urobilinogen, Urine 0.2 <2.0 E.U./dL    Nitrite, Urine Negative Negative    Leukocyte Esterase, Urine Negative Negative    Microscopic Examination Not Indicated     Urine Type  Voided     Mucus, UA 2+ (A) None Seen /LPF    WBC, UA 3-5 0 - 5 /HPF    RBC, UA 3-4 0 - 4 /HPF    Epithelial Cells, UA 21-50 (A) 0 - 5 /HPF    Amorphous, UA 3+ /HPF       RECENT VITALS:  BP: (!) 150/73, Temp: 97.7 F (36.5 C), Pulse: 85, Respirations: 16, SpO2: 99 %     Procedures       ED Course     Nursing Notes, Past Medical Hx, Past Surgical Hx, Social Hx, Allergies, and Family Hx were reviewed.    The patient was given the following medications:  Orders Placed This Encounter   Medications    morphine injection 4 mg    ondansetron (ZOFRAN) injection 4 mg    barium sulfate (READI-CAT 2) 2 % suspension 900 mL    morphine injection 4 mg    oxyCODONE-acetaminophen (PERCOCET) 5-325 MG per tablet 1 tablet     oxyCODONE-acetaminophen (PERCOCET) 5-325 MG per tablet     Sig: Take 1 tablet by mouth every 6 hours as needed for Pain for up to 3 days. Intended supply: 3 days. Take lowest dose possible to manage pain Max Daily Amount: 4 tablets     Dispense:  12 tablet     Refill:  0       CONSULTS:  None    MEDICAL DECISION MAKING / ASSESSMENT / PLAN     Gabrielle Wells is a 71 y.o. female ***.  ***    Critical Care:  Due to the immediate potential for life-threatening deterioration due to ***, I spent *** minutes providing critical care.  This time excludes time spent performing procedures but includes time spent on direct patient care, history retrieval, review of the chart, and discussions with patient, family, and consultant(s).    Clinical Impression     1. Acute exacerbation of chronic low back pain    2. Generalized abdominal pain        Disposition     PATIENT REFERRED TO:  [facility]  [address] [place] [address]  [phone]    Call on 07/15/2022  To discuss your ER visit, and arrange a follow-up appointment    [facility]  [address]  [place] [address]  [phone]  Call on 07/15/2022  To establish care for your spine problems      DISCHARGE MEDICATIONS:  New Prescriptions    OXYCODONE-ACETAMINOPHEN (PERCOCET) 5-325 MG PER TABLET    Take 1 tablet by mouth every 6 hours as needed for Pain for up to 3 days. Intended supply: 3 days. Take lowest dose possible to manage pain Max Daily Amount: 4 tablets       DISPOSITION Decision To Discharge    (Please note that portions of this note were completed with voice recognition software.  Efforts were made to edit the dictations but occasionally words are mis-transcribed.)

## 2022-07-12 NOTE — ED Triage Notes (Signed)
Patient arrived to ED with complaints of chromic back pain that has been moe painful than usual. Patient denies any issues with bowel or bladder but endorses back surgeries x3 years ago.

## 2022-07-12 NOTE — Discharge Instructions (Signed)
As discussed, the cause of your abdominal pain today is not certain.  Your laboratory evaluation and CAT scan did not show any concerning findings.  Particularly, there is no evidence of a small bowel obstruction today.  As regards the worsening of your low back pain, it is important to establish care with a spine specialist here in [place], given the extensive surgical history on your back.  You have been given a referral to [facility], and you should call on Monday morning, to discuss your ER visit, and arrange a follow-up appointment.  Please continue to take the muscle relaxer prescribed by your primary care physician.  In the meantime, over the weekend, you may take Percocet as prescribed for severe pain, but use caution, as this medication can cause sedation, and you should not drink alcohol, drive, or operate machinery while taking this medication.  Please call your primary care doctor's office on Monday morning, to discuss your ER visit, and arrange a follow-up appointment with them.  Call your doctor, or return to the emergency department, for worsening symptoms or other concerns.

## 2022-07-13 MED ORDER — OXYCODONE-ACETAMINOPHEN 5-325 MG PO TABS
5-325 MG | Freq: Once | ORAL | Status: AC
Start: 2022-07-13 — End: 2022-07-12
  Administered 2022-07-13: 01:00:00 1 via ORAL

## 2022-07-13 MED ORDER — OXYCODONE-ACETAMINOPHEN 5-325 MG PO TABS
5-325 MG | ORAL_TABLET | Freq: Four times a day (QID) | ORAL | 0 refills | Status: AC | PRN
Start: 2022-07-13 — End: 2022-07-15

## 2022-07-13 MED FILL — PERCOCET 5-325 MG PO TABS: 5-325 MG | ORAL | Qty: 1

## 2022-07-15 ENCOUNTER — Encounter

## 2022-08-14 ENCOUNTER — Encounter

## 2022-08-23 ENCOUNTER — Inpatient Hospital Stay: Admit: 2022-08-23 | Payer: MEDICARE | Primary: Family Medicine

## 2022-08-23 ENCOUNTER — Encounter

## 2022-08-23 DIAGNOSIS — Z1231 Encounter for screening mammogram for malignant neoplasm of breast: Secondary | ICD-10-CM

## 2022-08-23 NOTE — Telephone Encounter (Signed)
Formatting of this note might be different from the original.  LVM to confirm apt for 08/26/22 at 1200  Electronically signed by Lisbeth Renshaw, MA at 08/23/2022 11:26 AM EST

## 2022-08-23 NOTE — Unmapped (Signed)
LVM to confirm apt for 08/26/22 at 1200

## 2022-08-26 ENCOUNTER — Ambulatory Visit: Payer: Medicare (Managed Care)

## 2022-08-27 ENCOUNTER — Encounter: Payer: MEDICARE | Attending: Nephrology | Primary: Family Medicine

## 2022-09-05 ENCOUNTER — Encounter

## 2022-09-09 ENCOUNTER — Ambulatory Visit: Payer: MEDICARE | Primary: Family Medicine

## 2022-09-12 ENCOUNTER — Encounter: Payer: MEDICARE | Attending: Nephrology | Primary: Family Medicine

## 2022-09-20 ENCOUNTER — Ambulatory Visit: Payer: MEDICARE | Primary: Family Medicine

## 2022-09-26 ENCOUNTER — Encounter

## 2022-09-27 ENCOUNTER — Ambulatory Visit: Payer: Medicare (Managed Care)

## 2022-09-27 NOTE — Progress Notes (Deleted)
[facility]   Return Office Visit  Nurse Practitioner    CHIEF COMPLAINT:  No chief complaint on file.    Veronica Winters is a 72 y.o. female who is referred for the following: Chronic Diarrhea. Ms. Macha has had diarrhea for years. I feel Imodium made her more constipated than she already was. Her most recent KUB showed a moderate stool burden. I feel she is having constipation with overflow. She will start taking Citrucel and Miralax daily. She should have less episodes of incontinence if we can improve her bowel pattern. If this treatment does not help her Sx we will schedule a colonoscopy at next OV. Patient was agreeable with this plan.     HISTORY OF PRESENT ILLNESS:    Veronica Winters is a 72 y.o. female who is seen in follow up.    Patient was last seen on 03/14/2022 for Constipation, Incontinence of Feces.    Currently, she moves her bowels ***. She denies a recent change in bowel habits, hematochezia, melena, change in stool caliber. No unintentional weight loss, abdominal pain.     Denies any dysphagia, odynophagia, reflux, regurgitation, nausea. Denies any prior liver, pancreatic or gallbladder disease.      No history of anemia.  No sleep apnea. No narcotics. No history of anesthesia complications.    CT Abd/Pelvis w/o contrast: 03/01/2022  No acute intra-abdominal or pelvic pathology     Uncomplicated sigmoid diverticulosis.     Fatty infiltration of the liver      Last Colonoscopy: 2021  Colonoscopy on 2021: with Normal terminal ileum and normal mucosa (s/p sessile polyp removal). Negative infx work up on 2021 (Enteric panel+ C diff <0).  Outside records suggest investigations for microscopic colitis vs IBS but results are not known. KUB showed moderate stool burden which suggests possible overflow. We recommend a trial of Miralax.    Upper Endoscopy: never  Family History of Gastrointestinal Malignancy: none       REVIEW OF SYSTEMS:   (For the following ROS, pertinent positives are  in bold; pertinent negatives are in italics.)  CONSTITUTIONAL: fevers, chills, sweats, weight loss, fatigue  CV: chest pain, dyspnea, palpitations  RESP: cough, dyspnea  GI: see HPI    Past Medical History:   Past Medical History:   Diagnosis Date    (HFpEF) heart failure with preserved ejection fraction (CMS-HCC) 12/10/2021    Asthma     CHF (congestive heart failure) (CMS-HCC)     Hypercholesteremia     Hypertension     Renal disorder        Past Surgical History:   No past surgical history on file.    Current Medications:    Meds Reviews and Updated     ALLERGIES:  Vancomycin, Aspirin, Ibuprofen, Mushroom, Shellfish derived, Sulfa (sulfonamide antibiotics), Sulfasalazine, Acetaminophen, Ciprofloxacin, Coconut oil, Metronidazole, Amoxicillin-pot clavulanate, Coconut flavor, Codeine, Egg derived, Iodine, Metrizamide, and Povidone-iodine    SOCIAL HISTORY:    Social History     Socioeconomic History    Marital status: Single     Spouse name: Not on file    Number of children: Not on file    Years of education: Not on file    Highest education level: Not on file   Occupational History    Not on file   Tobacco Use    Smoking status: Never    Smokeless tobacco: Never   Substance and Sexual Activity    Alcohol use: Not Currently  Drug use: Not Currently    Sexual activity: Not Currently   Other Topics Concern    Not on file   Social History Narrative    Not on file     Social Determinants of Health     Financial Resource Strain: Not on file   Food Insecurity: Not on file   Transportation Needs: Not on file   Physical Activity: Not on file   Stress: Not on file   Social Connections: Not on file   Intimate Partner Violence: Not on file   Housing Stability: Not on file        FAMILY HISTORY:   No family history on file.    PHYSICAL EXAM:   Vital Signs:  There were no vitals taken for this visit.  There is no height or weight on file to calculate BMI.    CONSTITUTIONAL: WD/WN, in NAD  LUNGS: no increased work of breathing    CARDIOVASCULAR: RRR  ABDOMEN: no scars, normal bowel sounds, soft, non-distended, non-tender, no rebound or guarding, no hepatomegaly   MUSCULOSKELETAL: there is no redness, warmth, or swelling of the joints  SKIN: no redness, warmth, or swelling, no rashes and no jaundice    Lab Results   Component Value Date    WBC 4.3 07/12/2022    HGB 15.8 07/12/2022    HCT 48.6 (H) 07/12/2022    MCV 86.0 07/12/2022    PLT 110 (L) 07/12/2022     Lab Results   Component Value Date    GLUCOSE 95 07/12/2022    BUN 8 07/12/2022    CREATININE 1.0 07/12/2022    K 4.1 07/12/2022    PHOS 3.9 12/11/2021    ALBUMIN 3.5 12/11/2021    EGFR 56 12/12/2021     Lab Results   Component Value Date    ALKPHOS 48 12/09/2021    ALT 8 12/09/2021    AST 13 12/09/2021    BILITOT 0.7 12/09/2021    ALBUMIN 3.5 12/11/2021    BILIDIRECT <0.2 04/23/2021    PROT 7.6 12/09/2021     No results found for: IRON, TIBC, FERRITIN    DIAGNOSTIC STUDIES REVIEWED:    As Noted in the HPI     ASSESSMENT AND PLAN:  Veronica Winters is a 72 y.o. female who is seen in follow up.        Return to Clinic:     This document was dictated using Systems analyst.  While every effort is made to proofread the document as it is being created, please excuse any transcription errors encountered in reading.    Anders Grant, APRN  [facility]   [phone]

## 2022-09-30 NOTE — Progress Notes (Signed)
Called pt with Pre-procedure instructions for bone marrow bx.  Pt verified she has transportation arranged to and from hospital, will remain NPO after midnight, and pt is not on anticoagulant therapy. Reviewed basics of procedure with pt. Verbalized understanding - no further questions at this time.

## 2022-10-01 ENCOUNTER — Ambulatory Visit: Payer: MEDICARE | Primary: Family Medicine

## 2022-10-01 ENCOUNTER — Encounter

## 2022-10-01 ENCOUNTER — Inpatient Hospital Stay: Admit: 2022-10-01 | Discharge: 2022-10-01 | Payer: MEDICARE | Primary: Family Medicine

## 2022-10-01 DIAGNOSIS — D472 Monoclonal gammopathy: Secondary | ICD-10-CM

## 2022-10-01 LAB — APTT: aPTT: 20.2 s — ABNORMAL LOW (ref 22.7–35.9)

## 2022-10-01 LAB — PROTIME-INR
INR: 0.97 (ref 0.84–1.16)
Protime: 12.9 s (ref 11.5–14.8)

## 2022-10-01 LAB — PLATELET COUNT: Platelets: 103 10*3/uL — ABNORMAL LOW (ref 135–450)

## 2022-10-01 MED ORDER — BUPIVACAINE HCL (PF) 0.5 % IJ SOLN
0.5 | INTRAMUSCULAR | Status: AC
Start: 2022-10-01 — End: ?

## 2022-10-01 MED ORDER — MIDAZOLAM HCL 2 MG/2ML IJ SOLN
2 | INTRAMUSCULAR | Status: AC
Start: 2022-10-01 — End: ?

## 2022-10-01 MED ORDER — FENTANYL CITRATE (PF) 100 MCG/2ML IJ SOLN
100 | INTRAMUSCULAR | Status: AC
Start: 2022-10-01 — End: ?

## 2022-10-01 MED ORDER — LIDOCAINE HCL (PF) 1 % IJ SOLN
1 | INTRAMUSCULAR | Status: AC | PRN
Start: 2022-10-01 — End: 2022-10-01
  Administered 2022-10-01: 16:00:00 15 via INTRADERMAL

## 2022-10-01 MED ORDER — MIDAZOLAM HCL 2 MG/2ML IJ SOLN
2 | INTRAMUSCULAR | Status: AC | PRN
Start: 2022-10-01 — End: 2022-10-01
  Administered 2022-10-01 (×2): 1 via INTRAVENOUS

## 2022-10-01 MED ORDER — HEPARIN SODIUM (PORCINE) 1000 UNIT/ML IJ SOLN
1000 | INTRAMUSCULAR | Status: AC | PRN
Start: 2022-10-01 — End: 2022-10-01
  Administered 2022-10-01: 17:00:00 500

## 2022-10-01 MED ORDER — HEPARIN NA (PORK) LOCK FLSH PF 100 UNIT/ML IV SOLN
100 | INTRAVENOUS | Status: AC
Start: 2022-10-01 — End: ?

## 2022-10-01 MED ORDER — LIDOCAINE HCL 1% INJ (MIXTURES ONLY)
1 | INTRAMUSCULAR | Status: AC
Start: 2022-10-01 — End: ?

## 2022-10-01 MED ORDER — OXYCODONE-ACETAMINOPHEN 5-325 MG PO TABS
5-325 | Freq: Once | ORAL | Status: AC | PRN
Start: 2022-10-01 — End: 2022-10-01
  Administered 2022-10-01: 18:00:00 1 via ORAL

## 2022-10-01 MED ORDER — FENTANYL CITRATE (PF) 100 MCG/2ML IJ SOLN
100 | INTRAMUSCULAR | Status: AC | PRN
Start: 2022-10-01 — End: 2022-10-01
  Administered 2022-10-01 (×2): 25 via INTRAVENOUS

## 2022-10-01 MED ORDER — BUPIVACAINE HCL (PF) 0.5 % IJ SOLN
0.5 | INTRAMUSCULAR | Status: AC | PRN
Start: 2022-10-01 — End: 2022-10-01
  Administered 2022-10-01: 17:00:00 15 via INTRADERMAL

## 2022-10-01 MED FILL — XYLOCAINE-MPF 1 % IJ SOLN: 1 % | INTRAMUSCULAR | Qty: 30

## 2022-10-01 MED FILL — MARCAINE PRESERVATIVE FREE 0.5 % IJ SOLN: 0.5 % | INTRAMUSCULAR | Qty: 30

## 2022-10-01 MED FILL — PERCOCET 5-325 MG PO TABS: 5-325 MG | ORAL | Qty: 1

## 2022-10-01 MED FILL — HEPARIN NA (PORK) LOCK FLSH PF 100 UNIT/ML IV SOLN: 100 UNIT/ML | INTRAVENOUS | Qty: 5

## 2022-10-01 MED FILL — FENTANYL CITRATE (PF) 100 MCG/2ML IJ SOLN: 100 MCG/2ML | INTRAMUSCULAR | Qty: 2

## 2022-10-01 MED FILL — MIDAZOLAM HCL 2 MG/2ML IJ SOLN: 2 MG/ML | INTRAMUSCULAR | Qty: 2

## 2022-10-01 NOTE — Progress Notes (Signed)
[facility] NURSING PROGRESS NOTE    Procedure:  Bone Marrow Biopsy  October 01, 2022  Selenne A Luciana Axe      Allergies:    Allergies   Allergen Reactions    Vancomycin Other (See Comments)     Drug induced NEUTROPENIA    Aspirin Other (See Comments)     Stomach bleeding    Ibuprofen      Stomach bleeding    Mushroom Extract Complex Hives and Itching    Shellfish Allergy Hives and Itching    Shellfish-Derived Products Hives and Itching    Sulfa Antibiotics Hives, Itching and Nausea And Vomiting    Sulfasalazine Hives and Itching    Acetaminophen Itching    Betadine [Povidone Iodine]     Ciprofloxacin Other (See Comments)     Made "bottom" raw    Coconut (Cocos Nucifera)     Codeine     Fish-Derived Products     Iodides     Iodine     Metronidazole Other (See Comments)     Made "bottom" raw    Other      detergents    Sulfacetamide     Amoxicillin-Pot Clavulanate Itching     Has never had problems with amoxicillin/ penicillin    HAS TOLERATED ZOSYN WITH ZERO PROBLEMS    Coconut Flavor Itching    Eggs Or Egg-Derived Products Nausea And Vomiting    Iodinated Contrast Media Hives     Takes Benadryl '50mg'$  PO before receiving iodinated contrast       Vitals:    10/01/22 1051   BP: (!) 171/92   Pulse: 83   Resp: 22   Temp: 97.7 F (36.5 C)   SpO2: 95%       Recent lab work reviewed with MD: yes    Procedure explained to patient by MD: yes   Informed consent obtained:yes  Family with patient:yes    Mental Status:  Normal  Readiness to learn:  Yes  Barriers to learning: No    Pain Assessment Pre-Procedure:  Pain Present:  yes: low back - chronic  Pain Score:  6  Pain Quality/Description:  Aching    Time out Procedure Verification with:  '[x]'$  RN  '[x]'$  Physician  '[x]'$  Patient  '[x]'$  Other: CT Technologist  Procedure site marked, if applicable:  Yes    Note: Patient arrived A & O x 4, Spoke to Dr. Gerilyn Nestle and Charma Igo NP prior to procedure.  Procedural sedation:  Fentanyl:  58mg  Versed:    '2mg'$   Post Procedureal  Note:  Patient tolerated procedure well.  [facility]  Patient transported in stable conditon to room 6.    Pain Assessment Post-Procedure:  Pain Present:  yes  Pain Score:  4  Pain Quality/Description:  Aching    Plan of Care Goals:  Safety measures met:  Yes  Patient understands explanation of procedure:  Yes    Time in:  1118  Time out:  1[place] RN.  R.N. 10/01/2022

## 2022-10-01 NOTE — Progress Notes (Signed)
1200 pt resting quietly in bed, warm blankets given. Back bx site without problems, band aid in place-clean dry and intact.  1220 still have no orders Tanya in radiology called for orders.  1250 orders seen, pt given box lunch to eat--tolerating well.  1303 percocet 1 tablet given for c/o pain.  1330 Pt states pain level down to 2-3/10, pt feeling better. Pt finished eating--tolerated well.  1340 report given to Griffith Citron RN.

## 2022-10-01 NOTE — Progress Notes (Signed)
1345 States feeling much better, tolerating lunch well. Site unremarkable with no redness or swelling.

## 2022-10-01 NOTE — Discharge Instructions (Addendum)
Discharge Instructions for Biopsy of  Bone Marrow  Interventional Radiologist performing procedure Dr Charlsie Quest     Remove the bandage after a day or two.  May apply band-aid if needed.   Diet     Resume your normal diet.    Physical Activity     Rest for the first 24-48 hours.      No driving for 24 hours if you have received analgesia/sedation   May shower.  Do not submerge biopsy site in water (WU:JWJX, hot tub, swimming pool) for one week to prevent infection    Avoid strenuous activities for one week (excercise, heavy lifting etc.)       Call Your Doctor If Any of the Following Occurs   Signs of infection, including fever and chills     Redness, swelling, increasing pain, excessive bleeding, or any discharge from the incision site     Pain that you can't control with over the counter medications   Cough, shortness of breath, or chest pain     Pain when taking a deep breath     You feel your heart rate is fast   In case of an emergency, call 911 immediately.       [place]  A responsible person should be with you for the next 24 hours.       MEDICATION INSTRUCTIONS:  '[x]'$ Resume your prescribed medications  '[x]'$ You may take a non-prescription "headache remedy", preferably one that does not contain aspirin.  '[x]'$  Keep your medication list updated and carry it with you in case of emergencies.    IF YOU TAKE ANTI-COAGULANTS:  You may typically re-start your blood thinning drugs the day after your procedure UNLESS directed otherwise by the doctor who prescribes the drug for you. Some common blood thinning drugs include  Anti-inflammatory drugs (motrin, aleve)     Aspirin  Anti-coagulants such as plavix (clopidogrel) or coumadin (warfarin)      FOLLOW-UP CARE:  Follow up with your doctor for results and appointment    Physician Dr Samule Ohm [phone] [facility]        The above instructions were reviewed with patient/significant other.  The following  additional patient specific information was reviewed with the patient/significant other:       I have read and understand the instructions given to me:         ____________________________________________   (Patient/S.O. Signature)           Nurse Kerrin Mo, RN ,R.N.   Date/time 10/01/2022 1:41 PM         Radiology Department  [phone]           SAME DAY SERVICES:  [phone] M-F 7AM-6PM     If you smoke STOP. We care about your health!

## 2022-10-01 NOTE — Pre Sedation (Signed)
Patient:  Gabrielle Wells   DOB:   18-Jan-1951      Relevant clinical history, particularly as it involves the pending procedure, was reviewed and discussed.    The procedure including risks, benefits, and alternatives was discussed at length with the patient (or designated family member) and all questions were answered.  Informed consent to proceed with the procedure was given.    Vital signs were monitored and documented by the Radiology nurse.    Targeted physical examination  Heart : regular rate and rhythm  Lungs : clear, breathing easily  Condition : stable    [facility] nurses notes reviewed and agreed.    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    s/i I&D and removaal of hardware    GERD (gastroesophageal reflux disease)     HTN (hypertension)     Hyperlipidemia     IBS (irritable bowel syndrome)     Incontinence of urine     Lumbar radiculopathy     Morbid obesity with BMI of 45.0-49.9, adult (HCC)     Muscle fasciculation     Neuropathic pain     back 2/2 to spinal surgeries    SBO (small bowel obstruction) ([place]) 07/1999    with partial colectomy    Spinal fracture of T8 vertebra ([place])     s/p fusion     Surgical site infection     back, spinal hardware     Urinary incontinence        Past Surgical History:           Procedure Laterality Date    ABDOMEN SURGERY N/A 05/16/2021    ABDOMINAL FAT PAD EXCISIONAL BIOPSY performed by Mikael Spray, MD at [place]  2010    APPENDECTOMY      BREAST LUMPECTOMY Bilateral     benign lesions    CARDIAC CATHETERIZATION  2012    CHOLECYSTECTOMY      COLONOSCOPY      COLONOSCOPY  05/26/2020    COLONOSCOPY POLYPECTOMY SNARE/COLD BIOPSY performed by Alphonse Guild, MD at [place]  05/26/2020    COLONOSCOPY WITH BIOPSY performed by Alphonse Guild, MD at [place]  01/03/2021    CT BONE MARROW  BIOPSY 01/03/2021 [place] CT SCAN    HYSTERECTOMY, TOTAL ABDOMINAL (CERVIX REMOVED)      OVARY REMOVAL      TOTAL KNEE ARTHROPLASTY Bilateral 2009    TOTAL SHOULDER ARTHROPLASTY W/ DISTAL CLAVICLE EXCISION Right 2010    UPPER GASTROINTESTINAL ENDOSCOPY N/A 05/26/2020    EGD BIOPSY performed by Alphonse Guild, MD at [place] 5 YEARS  07/28/2018    XR MIDLINE EQUAL OR GREATER THAN 5 YEARS 07/28/2018       Allergies:  Vancomycin, Aspirin, Ibuprofen, Mushroom extract complex, Shellfish allergy, Shellfish-derived products, Sulfa antibiotics, Sulfasalazine, Acetaminophen, Betadine [povidone iodine], Ciprofloxacin, Coconut (cocos nucifera), Codeine, Fish-derived products, Iodides, Iodine, Metronidazole, Other, Sulfacetamide, Amoxicillin-pot clavulanate, Coconut flavor, Eggs or egg-derived products, and Iodinated contrast media    Medications:   Home Meds  Current Outpatient Medications on File Prior to Encounter   Medication Sig Dispense Refill    furosemide (LASIX)  20 MG tablet Take 2 tablets by mouth daily Take 80 mg if wt goes up 3-5lbs 30 tablet 1    gabapentin (NEURONTIN) 300 MG capsule TAKE ONE CAPSULE BY MOUTH 3 TIMES DAILY 90 capsule 0    carvedilol (COREG) 25 MG tablet TAKE ONE TABLET BY MOUTH TWICE DAILY 180 tablet 3    valsartan (DIOVAN) 320 MG tablet Take 1 tablet by mouth daily 90 tablet 3    empagliflozin (JARDIANCE) 10 MG tablet Take 1 tablet by mouth daily 90 tablet 3    atorvastatin (LIPITOR) 40 MG tablet Take 1 tablet by mouth daily 90 tablet 1    Cholecalciferol (VITAMIN D3) 25 MCG TABS TAKE TWO TABLETS BY MOUTH DAILY 60 tablet 2    albuterol sulfate HFA (VENTOLIN HFA) 108 (90 Base) MCG/ACT inhaler Inhale 2 puffs into the lungs 4 times daily as needed for Wheezing 18 g 0    tiZANidine (ZANAFLEX) 2 MG tablet Take 1 tablet by mouth nightly as needed (muscle spasms) 30 tablet 1    ondansetron (ZOFRAN-ODT) 4 MG disintegrating tablet DISSOLVE 1 TABLET ON THE TONGUE EVERY  8 HOURS AS NEEDED FOR NAUSEA 21 tablet 0    Handicap Placard MISC by Does not apply route 07/05/2021-07/05/2024 1 each 0    omeprazole (PRILOSEC) 20 MG delayed release capsule Take 1 capsule by mouth every morning (before breakfast) 90 capsule 1    acetaminophen (TYLENOL) 500 MG tablet Take 1 tablet by mouth every 6 hours as needed for Pain       No current facility-administered medications on file prior to encounter.       Current Meds  No current facility-administered medications for this encounter.        ASA 1 - Normal health patient    II (soft palate, uvula, fauces visible)    Activity:  2 - Able to move 4 extremities voluntarily on command  Respiration:  2 - Able to breathe deeply and cough freely  Circulation:  2 - BP+/- 35mHg of normal  Consciousness:  2 - Fully awake  Oxygen Saturation (color):  2 - Able to maintain oxygen saturation >92% on room air    Sedation : Moderate sedation planned    HPI / Treatment plan : CT guided bone marrow biopsy       Electronically signed by ESabino Gasser APRN - CNP on 10/01/22 at 11:10 AM EST

## 2022-10-01 NOTE — Brief Op Note (Signed)
Brief Postoperative Note    Gabrielle Wells  Date of Birth:  March 09, 1951  [id number]    Pre-operative Diagnosis: gammopathy, monoclonal     Post-operative Diagnosis: Same    Procedure: CT guided bone marrow biopsy     Anesthesia: moderate sedation     Surgeons/Assistants: Charma Igo, CNP; Lacie Draft, MD    Estimated Blood Loss: Minimal    Complications: none    Specimens: were obtained      Sabino Gasser, APRN - CNP   10/01/2022

## 2022-10-17 LAB — MISCELLANEOUS LAB TEST #1

## 2022-10-29 LAB — CULTURE, BLOOD 1: Blood Culture, Routine: NO GROWTH

## 2022-11-01 ENCOUNTER — Encounter: Payer: MEDICARE | Attending: Cardiovascular Disease | Primary: Family Medicine

## 2022-11-08 ENCOUNTER — Ambulatory Visit: Admit: 2022-11-08 | Primary: Family Medicine

## 2022-11-08 ENCOUNTER — Inpatient Hospital Stay: Payer: Medicare (Managed Care) | Attending: Gastroenterology

## 2022-11-08 MED ORDER — KETAMINE HCL 20 MG/2ML IJ SOSY
20 MG/2ML | INTRAMUSCULAR | Status: DC | PRN
  Administered 2022-11-08: 14:00:00 20 via INTRAVENOUS

## 2022-11-08 MED ORDER — LACTATED RINGERS IV SOLN
INTRAVENOUS | Status: DC
Start: 2022-11-08 — End: 2022-11-08
  Administered 2022-11-08: 14:00:00 via INTRAVENOUS

## 2022-11-08 MED ORDER — PROPOFOL 1000 MG/100ML IV EMUL
1000 MG/100ML | INTRAVENOUS | Status: DC | PRN
  Administered 2022-11-08: 14:00:00 150 via INTRAVENOUS

## 2022-11-08 MED ORDER — LIDOCAINE (CARDIAC) 100 MG/5ML IV SOLN (MIXTURES ONLY)
100 MG/5ML | INTRAVENOUS | Status: DC | PRN
  Administered 2022-11-08: 14:00:00 100 via INTRAVENOUS

## 2022-11-08 MED ORDER — GLYCOPYRROLATE 0.2 MG/ML IJ SOLN
0.2 MG/ML | INTRAMUSCULAR | Status: DC | PRN
  Administered 2022-11-08: 14:00:00 .1 via INTRAVENOUS

## 2022-11-08 MED ORDER — KETAMINE HCL 20 MG/2ML IJ SOSY
20 | INTRAMUSCULAR | Status: AC
Start: 2022-11-08 — End: ?

## 2022-11-08 MED ORDER — PROPOFOL 200 MG/20ML IV EMUL
200 MG/20ML | INTRAVENOUS | Status: DC | PRN
  Administered 2022-11-08: 14:00:00 50 via INTRAVENOUS

## 2022-11-08 MED FILL — KETAMINE HCL 20 MG/2ML IJ SOSY: 20 MG/2ML | INTRAMUSCULAR | Qty: 2

## 2022-11-08 NOTE — Discharge Instructions (Addendum)
Recommendations:  Await pathology results        ENDOSCOPY DISCHARGE INSTRUCTIONS:    Call the physician that did your procedure for any questions or concern:    [facility]: [phone]  DR. DAVID WENZKE      ACTIVITY:    There are potential side effects to the medications used for sedation and anesthesia during your procedure.  These include:  Dizziness or light-headedness, confusion or memory loss, delayed reaction times, loss of coordination, nausea and vomiting.  Because of your increased risk for injury, we ask that you observe the following precautions:  For the next 24 hours,  DO NOT operate an automobile, bicycle, motorcycle, lawn mower, power tools or large equipment of any kind.  Do not drink alcohol, sign any legal documents or make any legal decisions for 24 hours.  Do not bend your head over lower than your heart.  DO sit on the side of bed/couch awhile before getting up.  Plan on bedrest or quiet relaxation today.  You may resume normal activities in 24 hours.    DIET:    Your first meal today should be light, avoiding spicy and fatty foods.  If you tolerate this first meal, then you may advance to your regular diet unless otherwise advised by your physician.    NORMAL SYMPTOMS:  -Mild sore throat if you've had an EGD   -Gaseous discomfort    NOTIFY YOUR PHYSICIAN IF THESE SYMPTOMS OCCUR:  1. Fever (greater than 100)  5. Increased abdominal bloating  2. Severe pain    6. Excessive bleeding  3. Nausea and vomiting  7. Chest pain                                                                    4. Chills    8. Shortness of breath    ADDITIONAL INSTRUCTIONS:    Biopsy results: Call [place] for biopsy results in 1 week         Colon Polyps: Care Instructions  Your Care Instructions     Colon polyps are growths in the colon or the rectum. The cause of most colon polyps is not known, and most people who get them do not have any problems. But a certain kind can turn into cancer. For this reason,  regular testing for colon polyps is important for people as they get older. It is also important for anyone who has an increased risk for colon cancer.  Polyps are usually found through routine colon cancer screening tests. Although most colon polyps are not cancerous, they are usually removed and then tested for cancer. Screening for colon cancer saves lives because the cancer can usually be cured if it is caught early.  If you have a polyp that is the type that can turn into cancer, you may need more tests to examine your entire colon. The doctor will remove any other polyps that are found, and you will be tested more often.  Follow-up care is a key part of your treatment and safety. Be sure to make and go to all appointments, and call your doctor if you are having problems. It's also a good idea to know your test results and keep a list of the medicines you take.  How can you care for yourself at home?  Regular exams to look for colon polyps are the best way to prevent polyps from turning into colon cancer. These can include stool tests, sigmoidoscopy, colonoscopy, and CT colonography. Talk with your doctor about a testing schedule that is right for you.  To prevent polyps  There is no home treatment that can prevent colon polyps. But these steps may help lower your risk for cancer.  Stay active. Being active can help you get to and stay at a healthy weight. Try to exercise on most days of the week. Walking is a good choice.  Eat well. Choose a variety of vegetables, fruits, legumes (such as peas and beans), fish, poultry, and whole grains.  Do not smoke. If you need help quitting, talk to your doctor about stop-smoking programs and medicines. These can increase your chances of quitting for good.  If you drink alcohol, limit how much you drink. Limit alcohol to 2 drinks a day for men and 1 drink a day for women.  When should you call for help?   Call your doctor now or seek immediate medical care if:    You have  severe belly pain.     Your stools are maroon or very bloody.   Watch closely for changes in your health, and be sure to contact your doctor if:    You have a fever.     You have nausea or vomiting.     You have a change in bowel habits (new constipation or diarrhea).     Your symptoms get worse or are not improving as expected.   Where can you learn more?  Go to https://www.bennett.info/ and enter C571 to learn more about "Colon Polyps: Care Instructions."  Current as of: June 06, 2022               Content Version: 14.0   2006-2024 [employer].   Care instructions adapted under license by [facility]. If you have questions about a medical condition or this instruction, always ask your healthcare professional. [place] any warranty or liability for your use of this information.              Please review these discharge instructions this evening or tomorrow for  information you may have forgotten.            We want to thank you for choosing the [facility] as your health care provider. We always strive to provide you with excellent care while you are here. You may receive a survey in the mail regarding your care. We would appreciate you taking a few minutes of your time to complete this survey.

## 2022-11-08 NOTE — Anesthesia Pre-Procedure Evaluation (Signed)
Department of Anesthesiology  Preprocedure Note       Name:  Gabrielle Wells   Age:  72 y.o.  DOB:  1950-12-01                                          MRN:  [id number]         Date:  11/08/2022      Surgeon: Juliann Mule):  Alphonse Guild, MD    Procedure: Procedure(s):  COLONOSCOPY WITH BIOPSY    Medications prior to admission:   Prior to Admission medications    Medication Sig Start Date End Date Taking? Authorizing Provider   furosemide (LASIX) 20 MG tablet Take 2 tablets by mouth daily Take 80 mg if wt goes up 3-5lbs 06/17/22 07/17/22  Tindni, Arshdeep, MD   gabapentin (NEURONTIN) 300 MG capsule TAKE ONE CAPSULE BY MOUTH 3 TIMES DAILY 05/09/22 06/08/22  Jason Fila, MD   carvedilol (COREG) 25 MG tablet TAKE ONE TABLET BY MOUTH TWICE DAILY 01/09/22   Hervey Ard, MD   valsartan (DIOVAN) 320 MG tablet Take 1 tablet by mouth daily 12/19/21 04/30/22  Hervey Ard, MD   empagliflozin (JARDIANCE) 10 MG tablet Take 1 tablet by mouth daily 12/19/21   Hervey Ard, MD   atorvastatin (LIPITOR) 40 MG tablet Take 1 tablet by mouth daily 12/18/21   Bobette Mo H, DO   Cholecalciferol (VITAMIN D3) 25 MCG TABS TAKE TWO TABLETS BY MOUTH DAILY 12/18/21   Bobette Mo H, DO   albuterol sulfate HFA (VENTOLIN HFA) 108 (90 Base) MCG/ACT inhaler Inhale 2 puffs into the lungs 4 times daily as needed for Wheezing 07/16/21   Nemiah Commander, MD   tiZANidine (ZANAFLEX) 2 MG tablet Take 1 tablet by mouth nightly as needed (muscle spasms) 07/16/21   Nemiah Commander, MD   ondansetron (ZOFRAN-ODT) 4 MG disintegrating tablet DISSOLVE 1 TABLET ON THE TONGUE EVERY 8 HOURS AS NEEDED FOR NAUSEA 07/16/21   Nemiah Commander, MD   Handicap Placard MISC by Does not apply route 07/05/2021-07/05/2024 07/05/21   Jason Fila, MD   omeprazole (PRILOSEC) 20 MG delayed release capsule Take 1 capsule by mouth every morning (before breakfast) 11/28/20   Clayborne Dana, MD   acetaminophen (TYLENOL) 500 MG tablet Take 1 tablet by mouth every 6 hours as  needed for Pain    [provider]       Current medications:    No current facility-administered medications for this encounter.       Allergies:    Allergies   Allergen Reactions    Vancomycin Other (See Comments)     Drug induced NEUTROPENIA    Aspirin Other (See Comments)     Stomach bleeding    Ibuprofen      Stomach bleeding    Mushroom Extract Complex Hives and Itching    Shellfish Allergy Hives and Itching    Shellfish-Derived Products Hives and Itching    Sulfa Antibiotics Hives, Itching and Nausea And Vomiting    Sulfasalazine Hives and Itching    Acetaminophen Itching    Betadine [Povidone Iodine]     Ciprofloxacin Other (See Comments)     Made "bottom" raw    Coconut (Cocos Nucifera)     Codeine     Fish-Derived Products     Iodides     Iodine     Metronidazole Other (  See Comments)     Made "bottom" raw    Other      detergents    Sulfacetamide     Amoxicillin-Pot Clavulanate Itching     Has never had problems with amoxicillin/ penicillin    HAS TOLERATED ZOSYN WITH ZERO PROBLEMS    Coconut Flavor Itching    Egg-Derived Products Nausea And Vomiting    Iodinated Contrast Media Hives     Takes Benadryl 50mg  PO before receiving iodinated contrast       Problem List:    Patient Active Problem List   Diagnosis Code    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    Chronic combined systolic and diastolic CHF, 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    Resistant hypertension I1A.0    GERD (gastroesophageal reflux disease) K21.9    Back wound S21.209A    AKI (acute kidney injury) ([place]) N17.9    CKD (chronic kidney disease), 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    Vitamin D deficiency E55.9    Hyperlipidemia E78.5    Morbid obesity ([place]) E66.01    BPPV (benign paroxysmal positional vertigo) H81.10    Fall at home, initial encounter W19.XXXA, Y92.009    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    Suspected condition R69    Acute blood loss anemia D62    Backache M54.9     Severe hypertension I10    Chronic pain disorder G89.4    Community acquired pneumonia J18.9    CSF leak G96.00    Depression F32.A    Diarrhea R19.7    Discitis of lumbosacral region M46.47    Elevated lipase R74.8    Fever and chills R50.9    Fracture of neck of left humerus, closed, initial encounter S42.212A    Fracture of T10 vertebra (HCC) S22.079A    Generalized anxiety disorder F41.1    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    Proteus infection A49.8    Humerus fracture S42.309A    Hypotensive episode I95.9    Intertrigo L30.4    Obstipation K59.00    Klebsiella infection A49.8    Labile blood pressure R09.89    Leukopenia D72.819    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, 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    Neuropathic pain M79.2    Obesity hypoventilation syndrome (HCC) E66.2    Other specified anemias D64.89    Physical deconditioning R53.81    Pill dysphagia R13.10    Postoperative pain G89.18    Protein-calorie malnutrition, severe (HCC) E43    Pruritic condition L29.9    Zoster B02.9    Wound infection after surgery T81.49XA    CHF (congestive heart failure) (HCC) I50.9       Past Medical History:        Diagnosis Date    Arthritis     Asthma     CHF (congestive heart failure) (HCC)     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    s/i I&D and removaal of hardware    GERD (gastroesophageal reflux disease)     HTN (hypertension)     Hyperlipidemia     IBS (irritable bowel syndrome)     Incontinence of urine     Lumbar radiculopathy     Morbid obesity with BMI of 45.0-49.9, adult (HCC)     Muscle fasciculation     Neuropathic pain     back 2/2 to spinal  surgeries    SBO (small bowel obstruction) ([place]) 07/1999    with partial colectomy    Spinal fracture of T8 vertebra (HCC)     s/p fusion     Surgical site infection     back, spinal hardware     Urinary incontinence        Past Surgical History:        Procedure Laterality Date    ABDOMEN SURGERY N/A 05/16/2021    ABDOMINAL FAT PAD  EXCISIONAL BIOPSY performed by Mikael Spray, MD at [facility]  2010    APPENDECTOMY      BREAST LUMPECTOMY Bilateral     benign lesions    CARDIAC CATHETERIZATION  2012    CHOLECYSTECTOMY      COLONOSCOPY      COLONOSCOPY  05/26/2020    COLONOSCOPY POLYPECTOMY SNARE/COLD BIOPSY performed by Alphonse Guild, MD at [place]  05/26/2020    COLONOSCOPY WITH BIOPSY performed by Alphonse Guild, MD at [place]  01/03/2021    CT BONE MARROW BIOPSY 01/03/2021 [place] CT SCAN    HYSTERECTOMY, TOTAL ABDOMINAL (CERVIX REMOVED)      OVARY REMOVAL      TOTAL KNEE ARTHROPLASTY Bilateral 2009    TOTAL SHOULDER ARTHROPLASTY W/ DISTAL CLAVICLE EXCISION Right 2010    UPPER GASTROINTESTINAL ENDOSCOPY N/A 05/26/2020    EGD BIOPSY performed by Alphonse Guild, MD at [facility] 5 YEARS  07/28/2018    XR MIDLINE EQUAL OR GREATER THAN 5 YEARS 07/28/2018       Social History:    Social History     Tobacco Use    Smoking status: Never    Smokeless tobacco: Never   Substance Use Topics    Alcohol use: Never                                Counseling given: Not Answered      Vital Signs (Current):   Vitals:    11/08/22 0900   BP: (!) 189/99   Pulse: 89   Resp: 18   Temp: 97.2 F (36.2 C)   TempSrc: Temporal   SpO2: 94%   Weight: 113.9 kg (251 lb)   Height: 1.6 m (5\' 3" )                                              BP Readings from Last 3 Encounters:   11/08/22 (!) 189/99   10/01/22 126/74   07/12/22 (!) 150/73       NPO Status:                                                                                 BMI:   Wt Readings from Last 3 Encounters:   11/08/22 113.9 kg (251 lb)   10/01/22 115.2 kg (254 lb)  08/23/22 119.3 kg (263 lb)     Body mass index is 44.46 kg/m.    CBC:   Lab Results   Component Value Date/Time    WBC 4.3 07/12/2022 04:28 PM    RBC 5.65 07/12/2022 04:28 PM    HGB 15.8 07/12/2022 04:28 PM    HCT 48.6  07/12/2022 04:28 PM    MCV 86.0 07/12/2022 04:28 PM    RDW 14.3 07/12/2022 04:28 PM    PLT 103 10/01/2022 10:06 AM       CMP:   Lab Results   Component Value Date/Time    NA 142 07/12/2022 04:28 PM    K 4.1 07/12/2022 04:28 PM    CL 105 07/12/2022 04:28 PM    CO2 25 07/12/2022 04:28 PM    BUN 8 07/12/2022 04:28 PM    CREATININE 1.0 07/12/2022 04:28 PM    GFRAA >60 04/23/2021 10:46 PM    AGRATIO 1.0 05/08/2020 05:20 PM    LABGLOM 59 07/12/2022 04:28 PM    GLUCOSE 95 07/12/2022 04:28 PM    PROT 7.0 04/23/2021 10:46 PM    CALCIUM 9.8 07/12/2022 04:28 PM    BILITOT <0.2 04/23/2021 10:46 PM    ALKPHOS 78 04/23/2021 10:46 PM    AST 16 04/23/2021 10:46 PM    ALT 7 04/23/2021 10:46 PM       POC Tests: No results for input(s): "POCGLU", "POCNA", "POCK", "POCCL", "POCBUN", "POCHEMO", "POCHCT" in the last 72 hours.    Coags:   Lab Results   Component Value Date/Time    PROTIME 12.9 10/01/2022 10:06 AM    INR 0.97 10/01/2022 10:06 AM    APTT 20.2 10/01/2022 10:06 AM       HCG (If Applicable): No results found for: "PREGTESTUR", "PREGSERUM", "HCG", "HCGQUANT"     ABGs: No results found for: "PHART", "PO2ART", "PCO2ART", "HCO3ART", "BEART", "O2SATART"     Type & Screen (If Applicable):  No results found for: "LABABO", "LABRH"    Drug/Infectious Status (If Applicable):  No results found for: "HIV", "HEPCAB"    COVID-19 Screening (If Applicable):   Lab Results   Component Value Date/Time    COVID19 NOT DETECTED 04/23/2021 11:51 PM           Anesthesia Evaluation  Patient summary reviewed and Nursing notes reviewed   no history of anesthetic complications:   Airway: Mallampati: III  TM distance: >3 FB   Neck ROM: full  Mouth opening: > = 3 FB   Dental: normal exam         Pulmonary:   (+)       decreased breath sounds: bilateral    asthma:     (-) pneumonia                           Cardiovascular:  Exercise tolerance: poor (<4 METS)  (+) hypertension:, CHF: no interval change, hyperlipidemia      ECG reviewed  Rhythm:  regular  Rate: normal  Echocardiogram reviewed         Beta Blocker:  Dose within 24 Hrs      ROS comment: ECHO 4/23    - Left ventricle: The cavity size is normal. Wall thickness is normal. Systolic function was mildly     reduced. The estimated ejection fraction was in the range of 45% to 50%. Although no diagnostic     regional wall motion abnormality was identified, this possibility cannot be completely excluded  on     the basis of this study. Doppler parameters are consistent with abnormal left ventricular     relaxation (grade 1 diastolic dysfunction). There is no evidence of a thrombus revealed by     acoustic contrast opacification.   - Right ventricle: Systolic function was reduced by visual assessment.   - Atrial septum: The agitated saline study is very suboptimum and an atrial shunt cannot be excluded     based on the quality of the bubble study.   - Pericardium, extracardiac: A trivial pericardial effusion is identified.        Neuro/Psych:   (+) psychiatric history:   (-) neuromuscular disease           GI/Hepatic/Renal:   (+) GERD:, renal disease: CRI, morbid obesity          Endo/Other:    (+) : arthritis:., malignancy/cancer.                 Abdominal:   (+) obese    Abdomen: soft.      Vascular:          Other Findings:             Anesthesia Plan      MAC     ASA 3     (  )  Induction: intravenous.      Anesthetic plan and risks discussed with patient.      Plan discussed with CRNA.    Attending anesthesiologist reviewed and agrees with Preprocedure content          Darlin Coco, MD   11/08/2022

## 2022-11-08 NOTE — Progress Notes (Signed)
Ambulatory Surgery/Procedure Discharge Note    Vitals:    11/08/22 1034   BP: (!) 144/91   Pulse: 85   Resp: 16   Temp: 96.9 F (36.1 C)   SpO2: 100%       Pt ready for discharge per Aldrete score.    In: 300 [I.V.:300]  Out: -     Restroom use offered before discharge.  Yes    Pain assessment:  level of pain (1-10, 10 severe),   Pain Level: 0        Pt and S.O./family states "ready to go home". Pt alert and oriented x4. IV removed. Denies N/V or pain.  Voided prior to discharge. Pt tolerating po intake. Discharge instructions given to pt and brother with pt permission. Pt and brother verbalized understanding of all instructions. Left with all belongings, and discharge instructions.     Patient discharged to home/self care. Patient discharged via wheel chair by transporter to waiting family/S.O.       11/08/2022 10:46 AM

## 2022-11-08 NOTE — H&P (Signed)
Gastroenterology Note                 Pre-operative History and Physical    Patient: Gabrielle Wells  DOB: 12-06-1950  CSN:     History Obtained From:   Patient or guardian.      HISTORY OF PRESENT ILLNESS:    The patient is a 72 y.o. 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    s/i I&D and removaal of hardware    GERD (gastroesophageal reflux disease)     HTN (hypertension)     Hyperlipidemia     IBS (irritable bowel syndrome)     Incontinence of urine     Lumbar radiculopathy     Morbid obesity with BMI of 45.0-49.9, adult (HCC)     Muscle fasciculation     Neuropathic pain     back 2/2 to spinal surgeries    SBO (small bowel obstruction) ([place]) 07/1999    with partial colectomy    Spinal fracture of T8 vertebra (HCC)     s/p fusion     Surgical site infection     back, spinal hardware     Urinary incontinence      Past Surgical History:    Past Surgical History:   Procedure Laterality Date    ABDOMEN SURGERY N/A 05/16/2021    ABDOMINAL FAT PAD EXCISIONAL BIOPSY performed by Mikael Spray, MD at [place]  2010    APPENDECTOMY      BREAST LUMPECTOMY Bilateral     benign lesions    CARDIAC CATHETERIZATION  2012    CHOLECYSTECTOMY      COLONOSCOPY      COLONOSCOPY  05/26/2020    COLONOSCOPY POLYPECTOMY SNARE/COLD BIOPSY performed by Alphonse Guild, MD at [facility]  05/26/2020    COLONOSCOPY WITH BIOPSY performed by Alphonse Guild, MD at [facility]  01/03/2021    CT BONE MARROW BIOPSY 01/03/2021 [place] CT SCAN    HYSTERECTOMY, TOTAL ABDOMINAL (CERVIX REMOVED)      OVARY REMOVAL      TOTAL KNEE ARTHROPLASTY Bilateral 2009    TOTAL SHOULDER ARTHROPLASTY W/ DISTAL CLAVICLE EXCISION Right 2010    UPPER GASTROINTESTINAL ENDOSCOPY N/A 05/26/2020    EGD BIOPSY performed  by Alphonse Guild, MD at [facility] 5 YEARS  07/28/2018    XR MIDLINE EQUAL OR GREATER THAN 5 YEARS 07/28/2018     Medications Prior to Admission:   No current facility-administered medications on file prior to encounter.     Current Outpatient Medications on File Prior to Encounter   Medication Sig Dispense Refill    furosemide (LASIX) 20 MG tablet Take 2 tablets by mouth daily Take 80 mg if wt goes up 3-5lbs 30 tablet 1    gabapentin (NEURONTIN) 300 MG capsule TAKE ONE CAPSULE BY MOUTH 3 TIMES DAILY 90 capsule 0    carvedilol (COREG) 25 MG tablet TAKE ONE TABLET BY MOUTH TWICE DAILY 180 tablet 3    valsartan (DIOVAN) 320 MG tablet Take 1 tablet by mouth daily 90 tablet 3  empagliflozin (JARDIANCE) 10 MG tablet Take 1 tablet by mouth daily 90 tablet 3    atorvastatin (LIPITOR) 40 MG tablet Take 1 tablet by mouth daily 90 tablet 1    Cholecalciferol (VITAMIN D3) 25 MCG TABS TAKE TWO TABLETS BY MOUTH DAILY 60 tablet 2    albuterol sulfate HFA (VENTOLIN HFA) 108 (90 Base) MCG/ACT inhaler Inhale 2 puffs into the lungs 4 times daily as needed for Wheezing 18 g 0    tiZANidine (ZANAFLEX) 2 MG tablet Take 1 tablet by mouth nightly as needed (muscle spasms) 30 tablet 1    ondansetron (ZOFRAN-ODT) 4 MG disintegrating tablet DISSOLVE 1 TABLET ON THE TONGUE EVERY 8 HOURS AS NEEDED FOR NAUSEA 21 tablet 0    Handicap Placard MISC by Does not apply route 07/05/2021-07/05/2024 1 each 0    omeprazole (PRILOSEC) 20 MG delayed release capsule Take 1 capsule by mouth every morning (before breakfast) 90 capsule 1    acetaminophen (TYLENOL) 500 MG tablet Take 1 tablet by mouth every 6 hours as needed for Pain          Allergies:  Vancomycin, Aspirin, Ibuprofen, Mushroom extract complex, Shellfish allergy, Shellfish-derived products, Sulfa antibiotics, Sulfasalazine, Acetaminophen, Betadine [povidone iodine], Ciprofloxacin, Coconut (cocos nucifera), Codeine, Fish-derived products, Iodides,  Iodine, Metronidazole, Other, Sulfacetamide, Amoxicillin-pot clavulanate, Coconut flavor, Egg-derived products, and Iodinated contrast media      Social History:   Social History     Tobacco Use    Smoking status: Never    Smokeless tobacco: Never   Substance Use Topics    Alcohol use: Never     Family History:   Family History   Problem Relation Age of Onset    Cancer Paternal Grandmother         unk     Breast Cancer Maternal Aunt        PHYSICAL EXAM:      BP (!) 189/99   Pulse 89   Temp 97.2 F (36.2 C) (Temporal)   Resp 18   Ht 1.6 m (5\' 3" )   Wt 113.9 kg (251 lb)   SpO2 94%   BMI 44.46 kg/m  I        Heart:  RRR, normal s1s2    Lungs:  CTA and normal effort    Abdomen:   Soft, nt nd.         ASSESSMENT AND PLAN:    1.  Patient is a 72 y.o. female here for endoscopy with MAC sedation.    2.  Procedure options, risks and benefits reviewed with patient and/or guardian.  They express understanding.

## 2022-11-08 NOTE — Procedures (Signed)
[facility]  Colonoscopy Note    Patient: Gabrielle Wells  DOB: 1951/02/09  Acct#:     Procedure: Colonoscopy with biopsy, polypectomy (cold snare), polypectomy (snare cautery)    Date:  11/08/2022    Surgeon:  Alphonse Guild, MD    Referring Physician:  Alphonse Guild, MD    Anesthesia: IV propofol, per anesthesia    EBL: <50 mL    Indications: Diarrhea, personal history of colon polyps    Procedure:     An informed consent was obtained from the patient after explanation of indications, benefits, possible risks and complications of the procedure.  The patient was then taken to the endoscopy suite, placed in the left lateral decubitus position, and the above IV anesthesia was administered.    A digital rectal examination was performed and revealed negative without mass, lesions or tenderness.      The Olympus PCFQ-H190 video colonoscope was placed in the patient's rectum under digital direction and advanced to the cecum and terminal ileum. The cecum was identified by characteristic anatomy and ballottment.  The prep was fair.      Findings:  Fair prep.  Normal terminal ileum.  2 sessile polyps in the cecum measuring 3 to 8 mm removed via cold and hot snare polypectomy.  A 3 mm sessile polyp in the ascending colon removed via cold snare polypectomy.  Diverticulosis.  Normal colonic mucosa.  Biopsies were taken throughout the colon evaluate for microscopic colitis.      The scope was then withdrawn into the rectum and retroflexed.  The retroflexed view of the anal verge and rectum demonstrates medium hemorrhoids.     The scope was straightened, the colon was decompressed and the scope was withdrawn from the patient.      The patient tolerated the procedure well and was taken to the PACU in good condition.    Biopsies:  yes      Impression:   Fair prep.  Normal terminal ileum.  2 sessile polyps in the cecum measuring 3 to 8 mm removed via cold and hot snare polypectomy.  A 3 mm sessile  polyp in the ascending colon removed via cold snare polypectomy.  Diverticulosis.  Normal colonic mucosa.  Biopsies were taken throughout the colon evaluate for microscopic colitis.    Recommendations:  Await pathology results    Alphonse Guild, MD  [facility]

## 2022-11-08 NOTE — Anesthesia Post-Procedure Evaluation (Signed)
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    Aldrete Phase I: Aldrete Score: 10    Aldrete Phase II: Aldrete Score: 10    Anesthesia Post Evaluation    Patient location during evaluation: PACU  Patient participation: complete - patient participated  Level of consciousness: awake  Pain score: 0  Airway patency: patent  Nausea & Vomiting: no nausea  Cardiovascular status: hemodynamically stable  Respiratory status: acceptable  Hydration status: stable  Pain management: satisfactory to patient    No notable events documented.

## 2022-11-18 LAB — MISCELLANEOUS LAB TEST #1

## 2022-11-22 NOTE — Telephone Encounter (Signed)
Called to confirm appt and patient stated she had to cancel due to seeing a surgeon for surgery.  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  She wanted [facility] to know and wanted the doctor to call her back [phone].

## 2022-11-26 ENCOUNTER — Encounter: Payer: MEDICARE | Attending: Cardiovascular Disease | Primary: Family Medicine

## 2022-11-26 ENCOUNTER — Encounter: Admit: 2022-11-26 | Discharge: 2022-11-26 | Payer: MEDICARE | Attending: Surgery | Primary: Family Medicine

## 2022-11-26 DIAGNOSIS — C180 Malignant neoplasm of cecum: Secondary | ICD-10-CM

## 2022-11-26 NOTE — Progress Notes (Signed)
[place] [facility] [facility]  [place] [facility]  [address]  [address]  [place] [address]  Dept: [phone]  Dept Fax: [phone]  Loc: [phone]    Visit Date: 11/26/2022    Gabrielle Wells is a 72 y.o. female who presents today for: New Patient (Colon cancer )      HPI:       Gabrielle Wells is a 72 y.o. female referred to me for further evaluation regarding new diagnosis of cecal adenocarcinoma.    Gabrielle Wells is accompanied by her sister in the office and her daughter was available by telephone as well.  She has a very extensive surgical history, including what is described as two thirds of her small bowel being resected for incarceration, open cholecystectomy, 2 hernia repairs with large pieces of mesh.    She is under the treatment of Dr. Beaulah Dinning for multiple myeloma.  She recently underwent routine screening colonoscopy by Dr Jerilee Hoh 11/08/22.   He described 2 sessile polyps in the cecum measuring 3 to 8 mm, removed via cold and hot snare polypectomy.  There was no concern for underlying malignancy or any other abnormalities noted.  Pathology from cecal polyps came back as adenocarcinoma with possible submucosal invasion.  She has not had staging workup done.    She is being referred to me by Dr. Allena Katz for consideration of surgical intervention.    Patient's problem list, medications, past medical, surgical, family, and social histories were reviewed and updated in the chart as indicated today.    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    s/i I&D and removaal of hardware    GERD (gastroesophageal reflux disease)     HTN (hypertension)     Hyperlipidemia     IBS (irritable bowel syndrome)     Incontinence of urine     Lumbar radiculopathy     Morbid obesity with BMI of 45.0-49.9, adult (HCC)     Muscle fasciculation     Neuropathic pain     back 2/2 to spinal  surgeries    SBO (small bowel obstruction) (HCC) 07/1999    with partial colectomy    Spinal fracture of T8 vertebra (HCC)     s/p fusion     Surgical site infection     back, spinal hardware     Urinary incontinence        Past Surgical History:   Procedure Laterality Date    ABDOMEN SURGERY N/A 05/16/2021    ABDOMINAL FAT PAD EXCISIONAL BIOPSY performed by Lucky Cowboy, MD at [facility] OR    ABDOMINAL HERNIA REPAIR  2010    APPENDECTOMY      BREAST LUMPECTOMY Bilateral     benign lesions    CARDIAC CATHETERIZATION  2012    CHOLECYSTECTOMY      COLONOSCOPY      COLONOSCOPY  05/26/2020    COLONOSCOPY POLYPECTOMY SNARE/COLD BIOPSY performed by Adella Nissen, MD at [facility] ENDOSCOPY    COLONOSCOPY  05/26/2020    COLONOSCOPY WITH BIOPSY performed by Adella Nissen, MD at [facility] ENDOSCOPY    COLONOSCOPY N/A 11/08/2022    COLONOSCOPY WITH BIOPSY, COLD SNARE/HOT SNARE performed by Adella Nissen, MD at [facility] ENDOSCOPY    CT BONE MARROW BIOPSY  01/03/2021  CT BONE MARROW BIOPSY 01/03/2021 [facility] CT SCAN    HYSTERECTOMY, TOTAL ABDOMINAL (CERVIX REMOVED)      OVARY REMOVAL      TOTAL KNEE ARTHROPLASTY Bilateral 2009    TOTAL SHOULDER ARTHROPLASTY W/ DISTAL CLAVICLE EXCISION Right 2010    UPPER GASTROINTESTINAL ENDOSCOPY N/A 05/26/2020    EGD BIOPSY performed by Adella Nissen, MD at [facility] ENDOSCOPY    XR MIDLINE EQUAL OR GREATER THAN 5 YEARS  07/28/2018    XR MIDLINE EQUAL OR GREATER THAN 5 YEARS 07/28/2018       Cancer-related family history includes Breast Cancer in her maternal aunt; Cancer in her paternal grandmother.    Social History:   Social History     Tobacco Use    Smoking status: Never    Smokeless tobacco: Never   Substance Use Topics    Alcohol use: Never      Tobacco cessation counseling provided as appropriate.    Date of last Colonoscopy: 11/08/2022   No cologuard on file  No FIT/FOBT on file   No flexible sigmoidoscopy on file      REVIEW OF SYSTEMS:    Pertinent positives and negatives are mentioned in the HPI  above. Otherwise, all other systems were reviewed and negative.      Objective:     Physical Exam   Pulse 78   Temp 98 F (36.7 C) (Infrared)   Wt 112 kg (247 lb)   SpO2 97%   BMI 43.75 kg/m   Constitutional: Appears well-developed and well-nourished. Grooming appropriate. No gross deformities. Body mass index is 43.75 kg/m.  Eyes: No scleral icterus. Conjunctiva/lids normal. Vision intact grossly. Pupils equal/symmetric, reactive bilaterally.  ENT: External ears/nose without defect, scars, or masses. Hearing grossly intact. No facial deformity. Lips normal, normal dentition.  Neck: No masses. Trachea midline. No crepitus. Thyroid not enlarged.  Cardiovascular: Normal rate.  No peripheral edema. Abdominal aorta normal size to palpation.  Pulmonary/Chest: Effort normal. No respiratory distress. No wheezes. No use of accessory muscles.  Musculoskeletal: Normal range of motion x all 4 extremities and head/neck, without deformity, pain, or crepitus, with normal strength and tone. Normal gait. Nails without clubbing or cyanosis.   Neurological: Alert and oriented to person, place, and time. No gross deficits. Sensation intact.  Skin: Skin is dry. No rashes noted. No pallor. No induration of nodules.  Psychiatric: Normal mood and affect. Behavior normal. Oriented to person, place, and time. Judgment and insight reasonable.    Abdominal/wound: Morbidly obese, multiple previous abdominal incisions    Labs reviewed: Pathology report reviewed, CEA ordered  Radiology reviewed: CT scan chest, abdomen, pelvis ordered  Last colonoscopy: As above  Coordination/discussion with: Discussion with Dr. Allena Katz of hematology/oncology, discussion with Dr. 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  We started discussing surgical intervention, and I asked her about her previous abdominal surgeries.  She tells me she has had quite an extensive abdominal surgery history, including two thirds of her  small bowel removed, open cholecystectomy, multiple hernia repairs with mesh.  [place]  She tells me that every time she has an abdominal surgery she automatically gets an infection and this also resulted in spinal infections.  She has mentioned several previous back surgeries as well.  She has chronic diarrhea as well.    Overall I am extremely concerned from a technical perspective that Saysha may have a completely frozen abdomen and that her right colectomy may be near impossible.  Even if everything goes well from a technical perspective, this will clearly worsen her diarrhea.  I am also very concerned that she will have recurrence of her hernia and may require extensive hospital stay and never regain functional recovery.  Discussed possibility that in some cases we do a colon resection and pathology is completely negative.  As well, she has other risk factors such as morbid obesity, diabetes, CHF.    Given all of this, I had a long discussion with Dr. Allena KatzPatel as well as Dr. Jerilee HohWenzke, and we both agreed that attempting additional endoscopic maneuvers would be best, especially given the suspected low-grade nature of her tumors.     At this point would consider her a very poor operative candidate    Continue with current medications    DISPOSITION: Follow-up with GI, oncology    My findings will be relayed to consulting practitioner or PCP via Epic    Note  completed using dictation software, please excuse any errors.    Electronically signed by Kizzie Furnishory D Edilberto Roosevelt, MD on 11/26/2022 at 3:22 PM

## 2022-11-27 NOTE — Telephone Encounter (Signed)
Central Scheduling called in stating that the patient is allergic to iodine and would like to have updated CT orders placed in the patient's chart.    Patient stated that she broke out in hives the last time she had a scan done with contrast    Patient would like to be contacted directly to be advised of what the CT orders have been changed to    Please contact the patient at [phone]

## 2022-11-28 ENCOUNTER — Encounter

## 2022-11-28 MED ORDER — PREDNISONE 50 MG PO TABS
50 MG | ORAL_TABLET | ORAL | 0 refills | Status: AC
Start: 2022-11-28 — End: 2023-09-05
  Filled 2022-12-26: qty 8, 2d supply, fill #0

## 2022-11-28 NOTE — Telephone Encounter (Signed)
Spoke to patient, orders were placed for prednisone per the contrast dye allergy protocol. She was instructed to take Prednisone 50mg  13 hours prior to scan, 6 hours prior to scan and 1 hour prior to scan. She should also purchase OTC Benadryl and take 1 50mg  tablet 1 hour prior to scan. Patient was also given phone number for central scheduling and advised to call and schedule CT scan. Patient verbalized understanding and will call back if she has any questions.

## 2022-11-29 LAB — MISCELLANEOUS LAB TEST #1

## 2022-12-03 NOTE — Telephone Encounter (Signed)
Patient called stating she was supposed to be scheduled for a port placement procedure. She wanted someone from the clinical team to call her to discuss    Please follow up with patient [phone]

## 2022-12-04 NOTE — Telephone Encounter (Signed)
Patient has been scheduled for:    Procedure:Port Placement   Date:12/11/22   Time:8:30 AM   Arrival:6:30 AM   Hospital:[facility]     ASA?:None  Prep?NPO after midnight    Pre-op?N/A    Post-op Appt? N/A    Patient advised they will need a driver.    Orders faxed to surgery scheduling.    Medication sent to Pharmacy:     Stents or ostomy marking?    Instructions have been mailed/emailed to:MyChart     Added to outlook calendar

## 2022-12-05 ENCOUNTER — Inpatient Hospital Stay: Admit: 2022-12-05 | Payer: MEDICARE | Attending: Surgery | Primary: Family Medicine

## 2022-12-05 DIAGNOSIS — C180 Malignant neoplasm of cecum: Secondary | ICD-10-CM

## 2022-12-05 LAB — POCT VENOUS
Est, Glom Filt Rate: 59 — AB (ref >60)
POC Creatinine: 1 mg/dL (ref 0.6–1.2)

## 2022-12-05 MED ORDER — IOPAMIDOL 76 % IV SOLN
76 | Freq: Once | INTRAVENOUS | Status: AC | PRN
Start: 2022-12-05 — End: 2022-12-05
  Administered 2022-12-05: 16:00:00 75 mL via INTRAVENOUS

## 2022-12-05 MED ORDER — BARIUM SULFATE 2 % PO SUSP
2 | Freq: Once | ORAL | Status: AC | PRN
Start: 2022-12-05 — End: 2022-12-05
  Administered 2022-12-05: 16:00:00 900 mL via ORAL

## 2022-12-05 NOTE — Progress Notes (Addendum)
12/05/22 0805 am:     PRE-OP ORDERS/CONSENT ENTERED & SIGNED BY SURGEON/TS.     12/05/2022 0837 am:  LAST SEE BY [facility] 11/19/22, 12/03/22-CALLED FOR VISIT NOTE TO EVALUATE FOR USE AS H&P AND CALLED FOR RECENT LABS. TEAMS MESSAGE SENT TO DR Barnett Hatter OFFICE FOR H&P TO BE DONE DOS IF NEEDED/TS    NO PREOP LABS OR EKG ORDERED/TS    TI COMPLETED/TS

## 2022-12-05 NOTE — Progress Notes (Signed)
12/05/2022 0840 AM:        [facility] PRE-SURGICAL TESTING INSTRUCTIONS                      PRIOR TO PROCEDURE DATE:    1. PLEASE FOLLOW ANY INSTRUCTIONS GIVEN TO YOU PER YOUR SURGEON.      2. Arrange for someone to drive you home and be with you for the first 24 hours after discharge for your safety after your procedure for which you received sedation. Ensure it is someone we can share information with regarding your discharge.     NOTE: At this time ONLY 2 ADULTS may accompany you. NO CHILDREN UNDER AGE OF 16.    One person ENCOURAGED to stay at hospital entire time if outpatient surgery      3. You must contact your surgeon for instructions IF:  You are taking any blood thinners, aspirin, anti-inflammatory or vitamins.   Contact your ordering physician/surgeon for medication instructions as soon as possible, especially if taking blood thinners, aspirin, heart, or diabetic medication. STOP SUPPLEMENTS/VITAMINS/NON-STEROIDAL ANTI-INFLAMMATORY MEDICATION 7 DAYS PRIOR TO PROCEDURE.  There is a change in your physical condition such as a cold, fever, rash, cuts, sores, or any other infection, especially near your surgical site.    4. Do not drink alcohol the day before or day of your procedure.  Do not use any recreational marijuana at least 24 hours or street drugs (heroin, cocaine) at minimum 5 days prior to your procedure.     5. A Pre-Surgical History and Physical MUST be completed WITHIN 30 DAYS OR LESS prior to your procedure.by your Physician or an Urgent Care        THE DAY OF YOUR PROCEDURE:  1.  Follow instructions for ARRIVAL TIME as DIRECTED BY YOUR SURGEON. [facility] [address] [place] [address]     2. Enter the MAIN entrance from [employer] and follow the signs to the free Parking Garage or [employer] (offered free of charge 7 am-5pm).      3. Enter the Main Entrance of the hospital (do not enter from the lower level of the parking garage). Upon entrance, check in with  the receptionist at the surgical information desk on your LEFT.   Bring your insurance card and photo ID to register      4. DO NOT EAT ANYTHING 8 hours prior to arrival for surgery.  You may have up to 8 ounces of water 4 hours prior to your arrival for surgery.   NOTE: ALL Gastric, Bariatric & Bowel surgery patients - you MUST follow your surgeon's instructions regarding eating/ drinking as you will have very specific instructions to follow.  If you did not receive these, call your surgeon's office immediately.     5. MEDICATIONS:  The Day of procedure ONLY Take the following medications with a SMALL sip of water: COREG, MAY TAKE NEURONTIN.  PER ANESTHESIA STOP/HOLD DAILY JARDIANCE 4 FULL DAYS PRIOR TO PROCEDURE-LST DOSE TO BE 12/06/2022  DO NOT TAKE DIOVAN DOS  STOP/HOLD VITAMINS/SUPPLEMENTS/HERBAL SUPPLEMENTS/ANTI-INFLAMMATORY MEDICATION, 7 DAYS PRIOR TO PROCEDURE  6. Do not swallow additional water when brushing teeth. No gum, candy, mints, or ice chips. Refrain from smoking or at least decrease the amount on day of surgery.    7. Morning of surgery:   Take a shower with an antibacterial soap (i.e., Safeguard or Dial) OR your physician may have instructed you to use Hibiclens.  Dress in loose, comfortable  clothing appropriate for redressing after your procedure.   Do not wear jewelry (including body piercings), make-up (especially NO eye make-up), fingernail polish (NO toenail polish if foot/leg surgery), lotion, powders, or metal hairclips.   Do not shave or wax for 72 hours prior to procedure near your operative site. Shaving with a razor can irritate your skin and make it easier to develop an infection. On the day of your procedure, any hair that needs to be removed near the surgical site will be 'clipped' by a healthcare worker using a special clipper designed to avoid skin irritation.    8. Dentures, glasses, or contacts will need to be removed before your procedure. Bring cases for your glasses, contacts,  dentures, or hearing aids to protect them while you are in surgery.      9. If you use a CPAP, please bring it with you on the day of your procedure.    10. If you use oxygen at home, please bring your oxygen tank with you to hospital..     11. We recommend that valuable personal belongings such as cash, cell phones, e-tablets, or jewelry, be left at home during your stay. The hospital will not be responsible for valuables that are not secured in the hospital safe. However, if your insurance requires a co-pay, you may want to bring a method of payment, i.e., Check or credit card, if you wish to pay your co-pay the day of surgery.      12. If you are to stay overnight, you may bring a bag with personal items. Please have any large items you may need brought in by your family after your arrival to your hospital room.    13. If you have a Living Will or Durable Power of Attorney, please bring a copy on the day of your procedure.     How we keep you safe and work to prevent surgical site infections:   1. Health care workers should always check your ID bracelet to verify your name and birth date. You will be asked many times to state your name, date of birth, and allergies.    2. Health care workers should always clean their hands with soap or alcohol gel before providing care to you. It is okay to ask anyone if they cleaned their hands before they touch you.    3. You will be actively involved in verifying the type of procedure you are having and ensuring the correct surgical site. This will be confirmed multiple times prior to your procedure. Do NOT mark your surgery site UNLESS instructed to by your surgeon.     4. When you are in the operating room, your surgical site will be cleansed with a special soap, and in most cases, you will be given an antibiotic before the surgery begins.      What to expect AFTER your procedure?  1. Immediately following your procedure, your will be taken to the PACU for the first phase of  your recovery.  Your nurse will help you recover from any potential side effects of anesthesia, such as extreme drowsiness, changes in your vital signs or breathing patterns. Nausea, headache, muscle aches, or sore throat may also occur after anesthesia.  Your nurse will help you manage these potential side effects.    2. For comfort and safety, arrange to have someone at home with you for the first 24 hours after discharge.    3. You and your family will be given written  instructions about your diet, activity, dressing care, medications, and return visits.     4. Once at home, should issues with nausea, pain, or bleeding occur, or should you notice any signs of infection, you should call your surgeon.    5. Always clean your hands before and after caring for your wound. Do not let your family touch your surgery site without cleaning their hands.     6. Narcotic pain medications can cause significant constipation.  You may want to add a stool softener to your postoperative medication schedule or speak to your surgeon on how best to manage this SIDE EFFECT.    SPECIAL INSTRUCTIONS : NONE    Thank you for allowing [name] to care for you.  We strive to exceed your expectations in the delivery of care and service provided to you and your family.     If you need to contact the Pre-Admission Testing staff for any reason, please call us at [phone]. PRE OP HISTORY AND PHYSICAL CAN BE FAXED TO [phone].    Instructions reviewed with patient during preadmission testing phone interview.  Irving Shows, RN.12/05/2022 .8:40 AM      ADDITIONAL EDUCATIONAL INFORMATION REVIEWED PER PHONE WITH YOU AND/OR YOUR FAMILY:  No Hibiclens Bathing Instructions   Yes Antibacterial Soap

## 2022-12-09 NOTE — Telephone Encounter (Signed)
Spoke with patient to confirm port placement on 4/24. Arrival time 6:30 am, npo after midnight, need driver 18 or older.

## 2022-12-09 NOTE — Telephone Encounter (Addendum)
Patient is scheduled for a Port Placement with Dr. Judithann Sauger on 12/11/2022, and she is calling to inquire on the time of the procedure and her arrival time.     Patient would like the information on what she needs to do in preparation for the visit on 12/11/2022 as well.       Patient can be reached at [phone].

## 2022-12-11 ENCOUNTER — Ambulatory Visit: Admit: 2022-12-11 | Payer: MEDICARE | Primary: Family Medicine

## 2022-12-11 ENCOUNTER — Inpatient Hospital Stay: Payer: Medicare (Managed Care) | Attending: Surgery

## 2022-12-11 MED ORDER — SODIUM CHLORIDE 0.9 % IV SOLN
0.9 | INTRAVENOUS | Status: DC | PRN
Start: 2022-12-11 — End: 2022-12-11

## 2022-12-11 MED ORDER — CEFAZOLIN SODIUM 2 G SOLR (MIXTURES ONLY)
2 | Status: DC
Start: 2022-12-11 — End: 2022-12-11

## 2022-12-11 MED ORDER — LACTATED RINGERS IV SOLN
INTRAVENOUS | Status: DC
Start: 2022-12-11 — End: 2022-12-11
  Administered 2022-12-11: 12:00:00 via INTRAVENOUS

## 2022-12-11 MED ORDER — ROCURONIUM BROMIDE 50 MG/5ML IV SOLN
50 | INTRAVENOUS | Status: AC
Start: 2022-12-11 — End: ?

## 2022-12-11 MED ORDER — NORMAL SALINE FLUSH 0.9 % IV SOLN
0.9 | INTRAVENOUS | Status: DC | PRN
Start: 2022-12-11 — End: 2022-12-11

## 2022-12-11 MED ORDER — CARVEDILOL 6.25 MG PO TABS
6.25 | Freq: Once | ORAL | Status: AC
Start: 2022-12-11 — End: 2022-12-11
  Administered 2022-12-11: 12:00:00 12.5 mg via ORAL

## 2022-12-11 MED ORDER — SUGAMMADEX SODIUM 200 MG/2ML IV SOLN
200 | INTRAVENOUS | Status: AC
Start: 2022-12-11 — End: ?

## 2022-12-11 MED ORDER — ONDANSETRON HCL 4 MG/2ML IJ SOLN
4 MG/2ML | INTRAMUSCULAR | Status: DC | PRN
  Administered 2022-12-11: 13:00:00 4 via INTRAVENOUS

## 2022-12-11 MED ORDER — SODIUM CHLORIDE (PF) 0.9 % IJ SOLN
0.9 % | INTRAMUSCULAR | Status: DC | PRN
Start: 2022-12-11 — End: 2022-12-11

## 2022-12-11 MED ORDER — LIDOCAINE (CARDIAC) 100 MG/5ML IV SOLN (MIXTURES ONLY)
100 | INTRAVENOUS | Status: AC
Start: 2022-12-11 — End: ?

## 2022-12-11 MED ORDER — PROPOFOL 200 MG/20ML IV EMUL
200 MG/20ML | INTRAVENOUS | Status: DC | PRN
  Administered 2022-12-11: 13:00:00 150 via INTRAVENOUS
  Administered 2022-12-11: 13:00:00 20 via INTRAVENOUS
  Administered 2022-12-11 (×2): 50 via INTRAVENOUS

## 2022-12-11 MED ORDER — FENTANYL CITRATE (PF) 100 MCG/2ML IJ SOLN
100 | INTRAMUSCULAR | Status: DC | PRN
Start: 2022-12-11 — End: 2022-12-11

## 2022-12-11 MED ORDER — HEPARIN NA (PORK) LOCK FLSH PF 100 UNIT/ML IV SOLN
100 | INTRAVENOUS | Status: AC
Start: 2022-12-11 — End: ?

## 2022-12-11 MED ORDER — LACTATED RINGERS IV SOLN
INTRAVENOUS | Status: DC | PRN
  Administered 2022-12-11: 13:00:00 via INTRAVENOUS

## 2022-12-11 MED ORDER — FENTANYL CITRATE (PF) 100 MCG/2ML IJ SOLN
100 MCG/2ML | INTRAMUSCULAR | Status: DC | PRN
  Administered 2022-12-11: 13:00:00 100 via INTRAVENOUS

## 2022-12-11 MED ORDER — FENTANYL CITRATE (PF) 100 MCG/2ML IJ SOLN
100 | INTRAMUSCULAR | Status: AC
Start: 2022-12-11 — End: ?

## 2022-12-11 MED ORDER — DEXAMETHASONE SODIUM PHOSPHATE 4 MG/ML IJ SOLN
4 | INTRAMUSCULAR | Status: AC
Start: 2022-12-11 — End: ?

## 2022-12-11 MED ORDER — ONDANSETRON HCL 4 MG/2ML IJ SOLN
4 | INTRAMUSCULAR | Status: AC
Start: 2022-12-11 — End: ?

## 2022-12-11 MED ORDER — SUGAMMADEX SODIUM 200 MG/2ML IV SOLN
200 MG/2ML | INTRAVENOUS | Status: DC | PRN
  Administered 2022-12-11: 13:00:00 250 via INTRAVENOUS

## 2022-12-11 MED ORDER — HEPARIN NA (PORK) LOCK FLSH PF 100 UNIT/ML IV SOLN
100 | INTRAVENOUS | Status: DC | PRN
Start: 2022-12-11 — End: 2022-12-11
  Administered 2022-12-11: 13:00:00 5

## 2022-12-11 MED ORDER — PROCHLORPERAZINE EDISYLATE 10 MG/2ML IJ SOLN
10 | Freq: Once | INTRAMUSCULAR | Status: DC | PRN
Start: 2022-12-11 — End: 2022-12-11

## 2022-12-11 MED ORDER — NORMAL SALINE FLUSH 0.9 % IV SOLN
0.9 | Freq: Two times a day (BID) | INTRAVENOUS | Status: DC
Start: 2022-12-11 — End: 2022-12-11

## 2022-12-11 MED ORDER — PROPOFOL 200 MG/20ML IV EMUL
200 | INTRAVENOUS | Status: AC
Start: 2022-12-11 — End: ?

## 2022-12-11 MED ORDER — BUPIVACAINE-EPINEPHRINE (PF) 0.5% -1:200000 IJ SOLN
INTRAMUSCULAR | Status: DC | PRN
Start: 2022-12-11 — End: 2022-12-11
  Administered 2022-12-11: 13:00:00 15 via INTRAPLEURAL

## 2022-12-11 MED ORDER — OXYCODONE HCL 5 MG PO TABS
5 | ORAL | Status: DC | PRN
Start: 2022-12-11 — End: 2022-12-11

## 2022-12-11 MED ORDER — HYDROMORPHONE HCL PF 1 MG/ML IJ SOLN
1 | INTRAMUSCULAR | Status: DC | PRN
Start: 2022-12-11 — End: 2022-12-11

## 2022-12-11 MED ORDER — DEXAMETHASONE SODIUM PHOSPHATE 4 MG/ML IJ SOLN
4 MG/ML | INTRAMUSCULAR | Status: DC | PRN
  Administered 2022-12-11: 13:00:00 4 via INTRAVENOUS

## 2022-12-11 MED ORDER — ROCURONIUM BROMIDE 50 MG/5ML IV SOLN
50 MG/5ML | INTRAVENOUS | Status: DC | PRN
  Administered 2022-12-11: 13:00:00 30 via INTRAVENOUS

## 2022-12-11 MED ORDER — LIDOCAINE (CARDIAC) 100 MG/5ML IV SOLN (MIXTURES ONLY)
100 MG/5ML | INTRAVENOUS | Status: DC | PRN
  Administered 2022-12-11: 13:00:00 100 via INTRAVENOUS

## 2022-12-11 MED FILL — CEFAZOLIN SODIUM 2 G IV SOLR: 2 g | INTRAVENOUS | Qty: 2000

## 2022-12-11 MED FILL — BRIDION 200 MG/2ML IV SOLN: 200 MG/2ML | INTRAVENOUS | Qty: 4

## 2022-12-11 MED FILL — LIDOCAINE HCL (CARDIAC) PF 100 MG/5ML IV SOSY: 100 MG/5ML | INTRAVENOUS | Qty: 5

## 2022-12-11 MED FILL — DIPRIVAN 200 MG/20ML IV EMUL: 200 MG/20ML | INTRAVENOUS | Qty: 20

## 2022-12-11 MED FILL — FENTANYL CITRATE (PF) 100 MCG/2ML IJ SOLN: 100 MCG/2ML | INTRAMUSCULAR | Qty: 2

## 2022-12-11 MED FILL — DEXAMETHASONE SODIUM PHOSPHATE 4 MG/ML IJ SOLN: 4 MG/ML | INTRAMUSCULAR | Qty: 1

## 2022-12-11 MED FILL — ONDANSETRON HCL 4 MG/2ML IJ SOLN: 4 MG/2ML | INTRAMUSCULAR | Qty: 2

## 2022-12-11 MED FILL — CARVEDILOL 6.25 MG PO TABS: 6.25 MG | ORAL | Qty: 2

## 2022-12-11 MED FILL — HEPARIN NA (PORK) LOCK FLSH PF 100 UNIT/ML IV SOLN: 100 UNIT/ML | INTRAVENOUS | Qty: 20

## 2022-12-11 MED FILL — HEPARIN NA (PORK) LOCK FLSH PF 100 UNIT/ML IV SOLN: 100 UNIT/ML | INTRAVENOUS | Qty: 25

## 2022-12-11 MED FILL — DIPRIVAN 200 MG/20ML IV EMUL: 200 MG/20ML | INTRAVENOUS | Qty: 40

## 2022-12-11 MED FILL — ROCURONIUM BROMIDE 50 MG/5ML IV SOLN: 50 MG/5ML | INTRAVENOUS | Qty: 5

## 2022-12-11 NOTE — Progress Notes (Signed)
LEFT SUBCLAVIAN PORT PLACEMENT - Left   Dr Judithann Sauger    X--ray at bedside for film    IMPRESSION:     1.  Left subclavian approach port catheter with tip at the SVC.  2.  Clear lungs. No pneumothorax

## 2022-12-11 NOTE — Progress Notes (Signed)
LEFT SUBCLAVIAN PORT PLACEMENT   Dr Judithann Sauger    Current Allergies: Aspirin, Ibuprofen, Mushroom extract complex, Shellfish-derived products, Sulfa antibiotics, Sulfasalazine, Vancomycin, Amoxicillin-pot clavulanate, Coconut flavor, Egg-derived products, Iodinated contrast media, Shellfish allergy, Acetaminophen, Amoxicillin, Coconut oil, Iodides, Povidone-iodine, Ciprofloxacin, Coconut (cocos nucifera), Codeine, Fish-derived products, Iodine, Metronidazole, Other, Povidone iodine, and Sulfacetamide    No results for input(s): "POCGLU" in the last 72 hours.    Admitted to PACU bed 13 from OR. Arrived on a stretcher.  Patient to be discharged to home.      Attached to PACU monitoring system. Alarms and parameters set.   Report received from anesthesia personnel. Weston Brass crna & [place] srna  OR staff did not report skin issues that were observed while in OR, or if admitted with skin issue.  No problems reported intraoperatively.  Pt arrived with oxygen per  simple mask  with oxygen at 6 liters.  Athrombic wraps in place. Removed for discharge    Surgical site to the left chest.  Closed with surgical glue.    Doctors aware of all labs and diagnostics before coming to recovery.

## 2022-12-11 NOTE — H&P (Signed)
PRE-OP/PRE-PROCEDURE H&P    Visit Date: 12/11/2022    History:     Gabrielle Wells is a 72 y.o. female who presents today for procedure. See my/PCP/oncologist office notes for indications and details.    Patient Active Problem List:     Abscess in epidural space of lumbar spine     Chronic combined systolic and diastolic CHF, NYHA class 1 (HCC)     Resistant hypertension     GERD (gastroesophageal reflux disease)     Back wound     AKI (acute kidney injury) (HCC)     CKD (chronic kidney disease), stage II     Disorder involving thrombocytopenia (HCC)     Vitamin D deficiency     Hyperlipidemia     Morbid obesity (HCC)     BPPV (benign paroxysmal positional vertigo)     Fall at home, initial encounter     Monoclonal gammopathy of unknown significance (MGUS)     Suspected condition     Acute blood loss anemia     Backache     Severe hypertension     Chronic pain disorder     Community acquired pneumonia     CSF leak     Depression     Diarrhea     Discitis of lumbosacral region     Elevated lipase     Fever and chills     Fracture of neck of left humerus, closed, initial encounter     Fracture of T10 vertebra (HCC)     Generalized anxiety disorder     Glenohumeral arthritis     Proteus infection     Humerus fracture     Hypotensive episode     Intertrigo     Obstipation     Klebsiella infection     Labile blood pressure     Leukopenia     Left knee DJD     Lumbar pars defect     Narcotic-induced respiratory depression     Neuropathic pain     Obesity hypoventilation syndrome (HCC)     Other specified anemias     Physical deconditioning     Pill dysphagia     Postoperative pain     Protein-calorie malnutrition, severe (HCC)     Pruritic condition     Zoster     Wound infection after surgery     CHF (congestive heart failure) (HCC)     Multiple myeloma (HCC)         Current Facility-Administered Medications:     lactated ringers IV soln infusion, , IntraVENous, Continuous, Lenise Arena L, MD    sodium chloride flush  0.9 % injection 5-40 mL, 5-40 mL, IntraVENous, 2 times per day, Lissandro Dilorenzo D, MD    sodium chloride flush 0.9 % injection 5-40 mL, 5-40 mL, IntraVENous, PRN, Kj Imbert D, MD    0.9 % sodium chloride infusion, , IntraVENous, PRN, Kairav Russomanno, Lucilla Lame, MD    ceFAZolin (ANCEF) 2,000 mg in sterile water 20 mL IV syringe, 2,000 mg, IntraVENous, On Call to OR, Keri Tavella, Lucilla Lame, MD  Prior to Admission medications    Medication Sig Start Date End Date Taking? Authorizing Provider   predniSONE (DELTASONE) 50 MG tablet Take one tab 13 hours, 6 hours and 1 hour prior to CT Scan 11/28/22   Kizzie Furnish, MD   furosemide (LASIX) 20 MG tablet Take 2 tablets by mouth daily Take 80 mg if wt goes up 3-5lbs 06/17/22 11/08/22  Allegra Grana, MD   gabapentin (NEURONTIN) 300 MG capsule TAKE ONE CAPSULE BY MOUTH 3 TIMES DAILY 05/09/22 11/08/22  Barbee Cough, MD   carvedilol (COREG) 25 MG tablet TAKE ONE TABLET BY MOUTH TWICE DAILY  Patient taking differently: Take 0.5 tablets by mouth 2 times daily (with meals) 01/09/22   Laurette Schimke, MD   valsartan (DIOVAN) 320 MG tablet Take 1 tablet by mouth daily 12/19/21 11/08/22  Laurette Schimke, MD   empagliflozin (JARDIANCE) 10 MG tablet Take 1 tablet by mouth daily 12/19/21   Laurette Schimke, MD   atorvastatin (LIPITOR) 40 MG tablet Take 1 tablet by mouth daily 12/18/21   Cato Mulligan, DO   Cholecalciferol (VITAMIN D3) 25 MCG TABS TAKE TWO TABLETS BY MOUTH DAILY 12/18/21   Cherlyn Roberts H, DO   albuterol sulfate HFA (VENTOLIN HFA) 108 (90 Base) MCG/ACT inhaler Inhale 2 puffs into the lungs 4 times daily as needed for Wheezing 07/16/21   Roxy Manns, MD   tiZANidine (ZANAFLEX) 2 MG tablet Take 1 tablet by mouth nightly as needed (muscle spasms) 07/16/21   Roxy Manns, MD   ondansetron (ZOFRAN-ODT) 4 MG disintegrating tablet DISSOLVE 1 TABLET ON THE TONGUE EVERY 8 HOURS AS NEEDED FOR NAUSEA 07/16/21   Roxy Manns, MD   omeprazole (PRILOSEC) 20 MG delayed release capsule Take 1 capsule  by mouth every morning (before breakfast) 11/28/20   Margarito Liner, MD   acetaminophen (TYLENOL) 500 MG tablet Take 1 tablet by mouth every 6 hours as needed for Pain    [provider]     Allergies   Allergen Reactions    Aspirin Other (See Comments)     Stomach bleeding    Ibuprofen Other (See Comments)     Stomach bleeding    Mushroom Extract Complex Hives and Itching    Shellfish-Derived Products Hives and Itching    Sulfa Antibiotics Hives, Itching, Nausea And Vomiting and Other (See Comments)    Sulfasalazine Hives, Itching and Other (See Comments)    Vancomycin Other (See Comments)     Drug induced NEUTROPENIA    Amoxicillin-Pot Clavulanate Itching     Has never had problems with amoxicillin/ penicillin    HAS TOLERATED ZOSYN WITH ZERO PROBLEMS    Coconut Flavor Itching    Egg-Derived Products Nausea And Vomiting and Nausea Only    Iodinated Contrast Media Hives     Takes Benadryl 50mg  PO before receiving iodinated contrast    Takes Benadryl 50mg  PO before receiving iodinated contrast   Takes Benadryl 50mg  PO before receiving iodinated contrast   Takes Benadryl 50mg  PO before receiving iodinated contrast   Takes Benadryl 50mg  PO before receiving iodinated contrast    Shellfish Allergy Hives and Itching    Acetaminophen Itching and Other (See Comments)    Amoxicillin Other (See Comments)    Coconut Oil Itching    Iodides Hives    Povidone-Iodine Itching    Ciprofloxacin Other (See Comments)     Made "bottom" raw    Coconut (Cocos Nucifera) Itching    Codeine Itching and Other (See Comments)    Fish-Derived Products Nausea And Vomiting    Iodine Itching and Other (See Comments)    Metronidazole Other (See Comments)     Made "bottom" raw    Other Rash     detergents    Povidone Iodine Hives and Other (See Comments)    Sulfacetamide Rash     Past Medical History:  Diagnosis Date    Adenocarcinoma of colon (HCC)     Anesthesia     DURING HERNIA SURGERY-TROUBLE WAKING UP-SURGERY WENT LONGER    Arthritis      Asthma     CHF (congestive heart failure) (HCC)     CKD (chronic kidney disease)     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    s/i I&D and removaal of hardware    GERD (gastroesophageal reflux disease)     HTN (hypertension)     Hyperlipidemia     IBS (irritable bowel syndrome)     Incontinence of urine     Lumbar radiculopathy     Morbid obesity with BMI of 45.0-49.9, adult (HCC)     Multiple allergies     Multiple myeloma (HCC)     Muscle fasciculation     Neuropathic pain     back 2/2 to spinal surgeries    Prolonged emergence from general anesthesia     SBO (small bowel obstruction) (HCC) 07/1999    with partial colectomy    Spinal fracture of T8 vertebra (HCC)     s/p fusion     Surgical site infection     back, spinal hardware     Urinary incontinence     Uses walker      Past Surgical History:   Procedure Laterality Date    ABDOMEN SURGERY N/A 05/16/2021    ABDOMINAL FAT PAD EXCISIONAL BIOPSY performed by Lucky Cowboy, MD at [facility] OR    ABDOMINAL HERNIA REPAIR  2010    APPENDECTOMY      BREAST LUMPECTOMY Bilateral     benign lesions    CARDIAC CATHETERIZATION  2012    CHOLECYSTECTOMY      COLONOSCOPY      COLONOSCOPY  05/26/2020    COLONOSCOPY POLYPECTOMY SNARE/COLD BIOPSY performed by Adella Nissen, MD at [facility] ENDOSCOPY    COLONOSCOPY  05/26/2020    COLONOSCOPY WITH BIOPSY performed by Adella Nissen, MD at [facility] ENDOSCOPY    COLONOSCOPY N/A 11/08/2022    COLONOSCOPY WITH BIOPSY, COLD SNARE/HOT SNARE performed by Adella Nissen, MD at [facility] ENDOSCOPY    CT BONE MARROW BIOPSY  01/03/2021    CT BONE MARROW BIOPSY 01/03/2021 [id number] CT SCAN    HYSTERECTOMY, TOTAL ABDOMINAL (CERVIX REMOVED)      OVARY REMOVAL      TOTAL KNEE ARTHROPLASTY Bilateral 2009    TOTAL SHOULDER ARTHROPLASTY W/ DISTAL CLAVICLE EXCISION Right 2010    UPPER GASTROINTESTINAL ENDOSCOPY N/A 05/26/2020    EGD BIOPSY performed by Adella Nissen, MD at [facility] ENDOSCOPY    XR MIDLINE EQUAL OR GREATER THAN 5 YEARS  07/28/2018    XR MIDLINE EQUAL OR GREATER  THAN 5 YEARS 07/28/2018         Physical Exam:     Ht 1.6 m (5\' 3" )   Wt 109.8 kg (242 lb)   BMI 42.87 kg/m  Body mass index is 42.87 kg/m.  Constitutional: Appears well-developed and well-nourished.  Head: Normocephalic, atraumatic.   Eyes: No scleral icterus. Vision intact grossly.  ENT: Hearing grossly intact. No facial deformity.  Neck: Normal range of motion. No tracheal deviation.   Cardiovascular: Normal rate.  No peripheral edema.  Pulmonary/Chest: Effort normal. No respiratory distress. No wheezes. No use of accessory muscles.  Musculoskeletal: No gross deformity.   Neurological: Alert and oriented to person, place, and time. No gross deficits.  Skin: Skin is dry. No rash noted. No pallor.  Psychiatric: Normal mood and affect. Behavior normal. Oriented to person, place, and time.  Abdomen: soft, NTTP, non distended    Recent labs and imaging reviewed as necessary.    Assessment/Plan:       Proceed as planned for portacath placement    Risks/benefits/alternatives of procedure/surgery discussed with patient and any present family members (or appropriate guardian) and understanding verbalized. All questions answered. Patient wishes to proceed.    Electronically signed by Kizzie Furnish, MD on 12/11/2022 at 6:46 AM

## 2022-12-11 NOTE — Progress Notes (Signed)
PACU Transfer to SDS # 8 Note    Procedure(s):  LEFT SUBCLAVIAN PORT PLACEMENT    Dr Judithann Sauger    Current Allergies: Aspirin, Ibuprofen, Mushroom extract complex, Shellfish-derived products, Sulfa antibiotics, Sulfasalazine, Vancomycin, Amoxicillin-pot clavulanate, Coconut flavor, Egg-derived products, Iodinated contrast media, Shellfish allergy, Acetaminophen, Amoxicillin, Coconut oil, Iodides, Povidone-iodine, Ciprofloxacin, Coconut (cocos nucifera), Codeine, Fish-derived products, Iodine, Metronidazole, Other, Povidone iodine, and Sulfacetamide    Pt meets criteria as per Aldrete Score and ASPAN Standards to transfer to next phase of care.     No results for input(s): "POCGLU" in the last 72 hours.    Vitals:    12/11/22 1015   BP: (!) 175/97   Pulse: 68   Resp: 20   Temp: 97.9 F (36.6 C)   SpO2: 97%     Vitals within 20% of pt's admission vitals as per ALDRETE SCORE    SpO2: 97 %    O2 Flow Rate (L/min): 0 L/min      Intake/Output Summary (Last 24 hours) at 12/11/2022 1030  Last data filed at 12/11/2022 1015  Gross per 24 hour   Intake 715 ml   Output --   Net 715 ml     Tolerating ice chips  Offered [employer]    Pain assessment:  none    Pain Level: 0    Patient was assessed for alterations to skin integrity. There were not alterations observed.  Surgical glue to surgical site ice applied    Is patient incontinent: no    Handoff report given via handoff sheet  Family updated and directed to pt room via waiting room staff  Transported by Marchelle Folks will pick up personal items on the way      12/11/2022 10:30 AM

## 2022-12-11 NOTE — Anesthesia Pre-Procedure Evaluation (Signed)
Department of Anesthesiology  Preprocedure Note       Name:  Gabrielle Wells   Age:  72 y.o.  DOB:  26-Jul-1951                                          MRN:  [id number]         Date:  12/11/2022      Surgeon: Moishe Spice):  Judithann Sauger Lucilla Lame, MD    Procedure: Procedure(s):  PORT PLACEMENT    Medications prior to admission:   Prior to Admission medications    Medication Sig Start Date End Date Taking? Authorizing Provider   acyclovir (ZOVIRAX) 400 MG tablet Take 1 tablet by mouth 2 times daily   Yes [provider]   dexAMETHasone (DECADRON) 4 MG tablet Take 1 tablet by mouth daily   Yes [provider]   mirtazapine (REMERON) 15 MG tablet Take 1 tablet by mouth nightly   Yes [provider]   lenalidomide (REVLIMID) 25 MG chemo capsule Take 1 capsule by mouth daily   Yes [provider]   predniSONE (DELTASONE) 50 MG tablet Take one tab 13 hours, 6 hours and 1 hour prior to CT Scan 11/28/22   Kizzie Furnish, MD   furosemide (LASIX) 20 MG tablet Take 2 tablets by mouth daily Take 80 mg if wt goes up 3-5lbs 06/17/22 12/11/22  Tindni, Arshdeep, MD   gabapentin (NEURONTIN) 300 MG capsule TAKE ONE CAPSULE BY MOUTH 3 TIMES DAILY 05/09/22 12/11/22  Barbee Cough, MD   carvedilol (COREG) 25 MG tablet TAKE ONE TABLET BY MOUTH TWICE DAILY  Patient taking differently: Take 0.5 tablets by mouth 2 times daily (with meals) 01/09/22   Laurette Schimke, MD   valsartan (DIOVAN) 320 MG tablet Take 1 tablet by mouth daily 12/19/21 12/11/22  Laurette Schimke, MD   empagliflozin (JARDIANCE) 10 MG tablet Take 1 tablet by mouth daily 12/19/21   Laurette Schimke, MD   atorvastatin (LIPITOR) 40 MG tablet Take 1 tablet by mouth daily 12/18/21   Cherlyn Roberts H, DO   Cholecalciferol (VITAMIN D3) 25 MCG TABS TAKE TWO TABLETS BY MOUTH DAILY 12/18/21   Cherlyn Roberts H, DO   albuterol sulfate HFA (VENTOLIN HFA) 108 (90 Base) MCG/ACT inhaler Inhale 2 puffs into the lungs 4 times daily as needed for Wheezing 07/16/21   Roxy Manns, MD   tiZANidine (ZANAFLEX) 2 MG tablet Take 1 tablet by mouth nightly as needed (muscle spasms) 07/16/21   Roxy Manns, MD   ondansetron (ZOFRAN-ODT) 4 MG disintegrating tablet DISSOLVE 1 TABLET ON THE TONGUE EVERY 8 HOURS AS NEEDED FOR NAUSEA 07/16/21   Roxy Manns, MD   omeprazole (PRILOSEC) 20 MG delayed release capsule Take 1 capsule by mouth every morning (before breakfast) 11/28/20   Margarito Liner, MD   acetaminophen (TYLENOL) 500 MG tablet Take 1 tablet by mouth every 6 hours as needed for Pain  Patient not taking: Reported on 12/11/2022    [provider]       Current medications:    Current Facility-Administered Medications   Medication Dose Route Frequency Provider Last Rate Last Admin    lactated ringers IV soln infusion   IntraVENous Continuous Blossom Hoops, MD 50 mL/hr at 12/11/22 0828 New Bag at 12/11/22 0828    sodium chloride flush 0.9 % injection 5-40 mL  5-40 mL IntraVENous 2 times per day Celene Kras D, MD        sodium chloride flush 0.9 % injection 5-40 mL  5-40 mL IntraVENous PRN Barrat, Lucilla Lame, MD        0.9 % sodium chloride infusion   IntraVENous PRN Judithann Sauger, Lucilla Lame, MD        ceFAZolin (ANCEF) 2,000 mg in sterile water 20 mL IV syringe  2,000 mg IntraVENous On Call to OR Kizzie Furnish, MD           Allergies:    Allergies   Allergen Reactions    Aspirin Other (See Comments)     Stomach bleeding    Ibuprofen Other (See Comments)     Stomach bleeding    Mushroom Extract Complex Hives and Itching    Shellfish-Derived Products Hives and Itching    Sulfa Antibiotics Hives, Itching, Nausea And Vomiting and Other (See Comments)    Sulfasalazine Hives, Itching and Other (See Comments)    Vancomycin Other (See Comments)     Drug induced NEUTROPENIA    Amoxicillin-Pot Clavulanate Itching     Has never had problems with amoxicillin/ penicillin    HAS TOLERATED ZOSYN WITH ZERO PROBLEMS    Coconut Flavor Itching    Egg-Derived Products Nausea And Vomiting and Nausea Only     Iodinated Contrast Media Hives     Takes Benadryl 50mg  PO before receiving iodinated contrast    Takes Benadryl 50mg  PO before receiving iodinated contrast   Takes Benadryl 50mg  PO before receiving iodinated contrast   Takes Benadryl 50mg  PO before receiving iodinated contrast   Takes Benadryl 50mg  PO before receiving iodinated contrast    Shellfish Allergy Hives and Itching    Acetaminophen Itching and Other (See Comments)    Amoxicillin Other (See Comments)    Coconut Oil Itching    Iodides Hives    Povidone-Iodine Itching    Ciprofloxacin Other (See Comments)     Made "bottom" raw    Coconut (Cocos Nucifera) Itching    Codeine Itching and Other (See Comments)    Fish-Derived Products Nausea And Vomiting    Iodine Itching and Other (See Comments)    Metronidazole Other (See Comments)     Made "bottom" raw    Other Rash     detergents    Povidone Iodine Hives and Other (See Comments)    Sulfacetamide Rash       Problem List:    Patient Active Problem List   Diagnosis Code    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    Chronic combined systolic and diastolic CHF, 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    Resistant hypertension I1A.0    GERD (gastroesophageal reflux disease) K21.9    Back wound S21.209A    AKI (acute kidney injury) (HCC) N17.9    CKD (chronic kidney disease), 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    Vitamin D deficiency E55.9    Hyperlipidemia E78.5    Morbid obesity (HCC) E66.01    BPPV (benign paroxysmal positional vertigo) H81.10    Fall at home, initial encounter W19.XXXA, Y92.009    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    Suspected condition R69    Acute blood loss anemia D62    Backache M54.9    Severe hypertension I10    Chronic pain disorder G89.4    Community acquired pneumonia J18.9    CSF leak G96.00  Depression F32.A    Diarrhea R19.7    Discitis of lumbosacral region M46.47    Elevated lipase R74.8    Fever and chills R50.9    Fracture of  neck of left humerus, closed, initial encounter S42.212A    Fracture of T10 vertebra (HCC) S22.079A    Generalized anxiety disorder F41.1    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    Proteus infection A49.8    Humerus fracture S42.309A    Hypotensive episode I95.9    Intertrigo L30.4    Obstipation K59.00    Klebsiella infection A49.8    Labile blood pressure R09.89    Leukopenia D72.819    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, 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    Neuropathic pain M79.2    Obesity hypoventilation syndrome (HCC) E66.2    Other specified anemias D64.89    Physical deconditioning R53.81    Pill dysphagia R13.10    Postoperative pain G89.18    Protein-calorie malnutrition, severe (HCC) E43    Pruritic condition L29.9    Zoster B02.9    Wound infection after surgery T81.49XA    CHF (congestive heart failure) (HCC) I50.9    Multiple myeloma (HCC) C90.00       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    s/i I&D and removaal of hardware    GERD (gastroesophageal reflux disease)     HTN (hypertension)     Hyperlipidemia     IBS (irritable bowel syndrome)     Incontinence of urine     Lumbar radiculopathy     Morbid obesity with BMI of 45.0-49.9, adult (HCC)     Multiple allergies     Multiple myeloma (HCC)     Muscle fasciculation     Neuropathic pain     back 2/2 to spinal surgeries    Prolonged emergence from general anesthesia     SBO (small bowel obstruction) (HCC) 07/1999    with partial colectomy    Spinal fracture of T8 vertebra (HCC)     s/p fusion     Surgical site infection     back, spinal hardware     Urinary incontinence     Uses walker        Past Surgical History:        Procedure Laterality Date    ABDOMEN SURGERY N/A  05/16/2021    ABDOMINAL FAT PAD EXCISIONAL BIOPSY performed by Lucky Cowboy, MD at [facility] OR    ABDOMINAL HERNIA REPAIR  2010    APPENDECTOMY      BREAST LUMPECTOMY Bilateral     benign lesions    CARDIAC CATHETERIZATION  2012    CHOLECYSTECTOMY      COLONOSCOPY      COLONOSCOPY  05/26/2020    COLONOSCOPY POLYPECTOMY SNARE/COLD BIOPSY performed by Adella Nissen, MD at [facility] ENDOSCOPY    COLONOSCOPY  05/26/2020    COLONOSCOPY WITH BIOPSY performed by Adella Nissen, MD at [facility] ENDOSCOPY    COLONOSCOPY N/A 11/08/2022    COLONOSCOPY WITH BIOPSY, COLD SNARE/HOT SNARE performed by Kirke Corin  J, MD at [facility] ENDOSCOPY    CT BONE MARROW BIOPSY  01/03/2021    CT BONE MARROW BIOPSY 01/03/2021 [facility] CT SCAN    HYSTERECTOMY, TOTAL ABDOMINAL (CERVIX REMOVED)      OVARY REMOVAL      TOTAL KNEE ARTHROPLASTY Bilateral 2009    TOTAL SHOULDER ARTHROPLASTY W/ DISTAL CLAVICLE EXCISION Right 2010    UPPER GASTROINTESTINAL ENDOSCOPY N/A 05/26/2020    EGD BIOPSY performed by Adella Nissen, MD at [facility] ENDOSCOPY    XR MIDLINE EQUAL OR GREATER THAN 5 YEARS  07/28/2018    XR MIDLINE EQUAL OR GREATER THAN 5 YEARS 07/28/2018       Social History:    Social History     Tobacco Use    Smoking status: Never    Smokeless tobacco: Never   Substance Use Topics    Alcohol use: Never                                Counseling given: Not Answered      Vital Signs (Current):   Vitals:    12/05/22 0825 12/11/22 0654 12/11/22 0732   BP:  (!) 203/97 (!) 203/98   Pulse:  86    Resp:  18    SpO2:  97%    Weight: 109.8 kg (242 lb) 114.8 kg (253 lb)    Height: 1.6 m (5\' 3" ) 1.6 m (5\' 3" )                                               BP Readings from Last 3 Encounters:   12/11/22 (!) 203/98   11/08/22 (!) 147/68   10/01/22 126/74       NPO Status: Time of last liquid consumption: 2100                        Time of last solid consumption: 2100                        Date of last liquid consumption: 12/10/22                        Date of last solid food  consumption: 12/10/22    BMI:   Wt Readings from Last 3 Encounters:   12/11/22 114.8 kg (253 lb)   11/26/22 112 kg (247 lb)   11/08/22 113.9 kg (251 lb)     Body mass index is 44.82 kg/m.    CBC:   Lab Results   Component Value Date/Time    WBC 4.3 07/12/2022 04:28 PM    RBC 5.65 07/12/2022 04:28 PM    HGB 15.8 07/12/2022 04:28 PM    HCT 48.6 07/12/2022 04:28 PM    MCV 86.0 07/12/2022 04:28 PM    RDW 14.3 07/12/2022 04:28 PM    PLT 103 10/01/2022 10:06 AM       CMP:   Lab Results   Component Value Date/Time    NA 142 07/12/2022 04:28 PM    K 4.1 07/12/2022 04:28 PM    CL 105 07/12/2022 04:28 PM    CO2 25 07/12/2022 04:28 PM    BUN 8 07/12/2022 04:28 PM    CREATININE 1.0 12/05/2022 11:08 AM  CREATININE 1.0 07/12/2022 04:28 PM    GFRAA >60 04/23/2021 10:46 PM    AGRATIO 1.0 05/08/2020 05:20 PM    LABGLOM 59 12/05/2022 11:08 AM    GLUCOSE 95 07/12/2022 04:28 PM    PROT 7.0 04/23/2021 10:46 PM    CALCIUM 9.8 07/12/2022 04:28 PM    BILITOT <0.2 04/23/2021 10:46 PM    ALKPHOS 78 04/23/2021 10:46 PM    AST 16 04/23/2021 10:46 PM    ALT 7 04/23/2021 10:46 PM       POC Tests: No results for input(s): "POCGLU", "POCNA", "POCK", "POCCL", "POCBUN", "POCHEMO", "POCHCT" in the last 72 hours.    Coags:   Lab Results   Component Value Date/Time    PROTIME 12.9 10/01/2022 10:06 AM    INR 0.97 10/01/2022 10:06 AM    APTT 20.2 10/01/2022 10:06 AM       HCG (If Applicable): No results found for: "PREGTESTUR", "PREGSERUM", "HCG", "HCGQUANT"     ABGs: No results found for: "PHART", "PO2ART", "PCO2ART", "HCO3ART", "BEART", "O2SATART"     Type & Screen (If Applicable):  No results found for: "LABABO", "LABRH"    Drug/Infectious Status (If Applicable):  No results found for: "HIV", "HEPCAB"    COVID-19 Screening (If Applicable):   Lab Results   Component Value Date/Time    COVID19 NOT DETECTED 04/23/2021 11:51 PM           Anesthesia Evaluation  Patient summary reviewed   no history of anesthetic complications:   Airway: Mallampati:  II  TM distance: >3 FB   Neck ROM: limited  Mouth opening: > = 3 FB   Dental:          Pulmonary: breath sounds clear to auscultation  (+)           asthma:     (-) COPD, rhonchi, wheezes and no decreased breath sounds                           Cardiovascular:  Exercise tolerance: poor (<4 METS)  (+) hypertension:    (-) past MI, CABG/stent and murmur      Rhythm: regular  Rate: normal                 ROS comment: ECHO 12/11/21:    Study Conclusions     - Left ventricle: The cavity size is normal. Wall thickness is normal. Systolic function was mildly     reduced. The estimated ejection fraction was in the range of 45% to 50%. Although no diagnostic     regional wall motion abnormality was identified, this possibility cannot be completely excluded on     the basis of this study. Doppler parameters are consistent with abnormal left ventricular     relaxation (grade 1 diastolic dysfunction). There is no evidence of a thrombus revealed by     acoustic contrast opacification.   - Right ventricle: Systolic function was reduced by visual assessment.   - Atrial septum: The agitated saline study is very suboptimum and an atrial shunt cannot be excluded     based on the quality of the bubble study.   - Pericardium, extracardiac: A trivial pericardial effusion is identified.     Impressions:     - Poor image quality limits interpretation. No prior for comparison.   - Consider alternate imaging modality( cardiac MRI) if clinically indicated        Neuro/Psych:      (-)  seizures, TIA and CVA           GI/Hepatic/Renal:   (+) GERD:, renal disease: CRI          Endo/Other:        (-) diabetes mellitus, hypothyroidism, hyperthyroidism               Abdominal:   (+) obese          Vascular:          Other Findings:             Anesthesia Plan      general     ASA 3       Induction: intravenous.      Anesthetic plan and risks discussed with patient.    Use of blood products discussed with patient whom consented to blood products.     Plan discussed with CRNA.    Attending anesthesiologist reviewed and agrees with Preprocedure content                Jamesetta Orleans Devaeh Amadi, DO   12/11/2022

## 2022-12-11 NOTE — Anesthesia Post-Procedure Evaluation (Signed)
Department of Anesthesiology  Postprocedure Note    Patient: Gabrielle Wells  MRN: [id number]  Birthdate: 1951/03/12  Date of evaluation: 12/11/2022    Procedure Summary       Date: 12/11/22 Room / Location: [facility] OR 03 / [facility]    Anesthesia Start: [phone] Anesthesia Stop: 0927    Procedure: LEFT SUBCLAVIAN PORT PLACEMENT (Left: Chest) Diagnosis:       Multiple myeloma (HCC)      (Multiple myeloma (HCC) [C90.00])    Surgeons: Kizzie Furnish, MD Responsible Provider: Laverta Dewar, DO    Anesthesia Type: General ASA Status: 3            Anesthesia Type: General    Aldrete Phase I: Aldrete Score: 8    Aldrete Phase II:      Vitals:    12/11/22 1015   BP: (!) 175/97   Pulse: 68   Resp: 20   Temp: 97.9 F (36.6 C)   SpO2: 97%       Anesthesia Post Evaluation    Patient location during evaluation: PACU  Patient participation: complete - patient participated  Level of consciousness: awake and awake and alert  Pain score: 0  Airway patency: patent  Nausea & Vomiting: no nausea and no vomiting  Cardiovascular status: hemodynamically stable  Respiratory status: acceptable  Hydration status: euvolemic  Pain management: adequate and satisfactory to patient        No notable events documented.

## 2022-12-11 NOTE — Discharge Instructions (Addendum)
LEFT SUBCLAVIAN PORT PLACEMENT - Left   Dr Judithann Sauger    Current Allergies: See List    [facility] AMBULATORY PROCEDURE DISCHARGE INSTRUCTIONS    There are potential side effects of anesthesia or sedation you may experience for the first 24 hours.  These side effects include:    Confusion or Memory loss, Dizziness, or Delayed Reaction Times   [x] A responsible person should be with you for the next 24 hours.  Do not operate any vehicles (automobiles, bicycles, motorcycles) or power tools or machinery for 24 hours.  Do not sign any legal documents or make any legal decisions for 24 hours. Do not drink alcohol for 24 hours or while taking narcotic pain medication.      Nausea/Diet  [x]  Nausea is not uncommon after having general anesthesia.  Start with light diet and progress to your normal diet as you feel like eating. However, if you experience nausea or repeated episodes of vomiting which persist beyond 12-24 hours, notify your physician.  Once nausea has passed, remember to keep drinking fluids.    Difficulty Passing Urine  [x] Drink extra amounts of fluid today.  Notify your physician if you have not urinated within 8 hours after your procedure or you feel uncomfortable.      Irritated Throat from a Breathing Tube  [x] Drink extra amounts of fluid today.  Lozenges may help.    Muscle Aches  [x] You may experience some generalized body aches as your muscles recover from medications used to relax them during surgery.  These will gradually subside.    Please follow the instructions checked below:    DIET INSTRUCTIONS:  [x] Start with light diet and progress to your normal diet as you feel like eating. If you experience nausea or repeated episodes of vomiting which persist beyond 12-24 hours, notify your doctor.     ACTIVITY INSTRUCTIONS:  [x] Rest today. Increase activity as tolerated    [x] No heavy lifting or strenuous activity. Nothing over 8 lbs (full gallon of milk) until cleared by the doctor.     [x] No driving  for 24 hours or while taking narcotic pain medication  [x] Use walker   [x] Weight bearing: [] none /  [] partial / [x] full as tolerated/[]  toe touch/[] heel touch   [] Other     WOUND/DRESSING INSTRUCTIONS:       Always ensure you and your care giver clean hands before and after caring for the wound.  [x] May shower starting   PO #1                                                      [x]  Derma bond dressing-Do not apply liquid or ointment medications or any other product to your wound while the adhesive dressing is in place. These may loosen the film before your wound is healed. You may occasionally and briefly wet your wound in the shower.  After showering, gently blot your wound dry with a soft towel.  [x]  Ice to operative site for 15-30 minutes of each hour while awake for 24-36 hours  []     MEDICATION INSTRUCTIONS:    [x] You may take a non-prescription "headache remedy", preferably one that does not contain aspirin. Do not exceed 4000 mg of acetaminophen/ Tylenol in  24 hours. Be aware that your prescription may contain acetaminophen.    [x] Narcotic pain medications can cause  significant constipation.  You may want to add a stool softener to your postoperative medication schedule or speak to your surgeon on how best to manage this side effect.    If you have a CPAP machine, it is very important that you use it daily during all periods of sleep and daytime rest during your recovery at home.  Surgery and Anesthesia place a significant amount of stress on your body.  Using your CPAP will help keep you safe and lessen the negative effects of that stress.    FOLLOW-UP CARE:  Call the office as instructed for follow-up appointment.  Watch for these significant complications.  Call physician if they or any other problems occur:  Fever over 100.4    Redness, swelling, hardness or warmth at the operative site  Unrelieved nausea    Foul smelling or cloudy drainage at the operative site   Unrelieved pain    Blood soaked  dressing. (Some oozing may be normal)  Inability to urinate      Numb, pale, blue, cold or tingling extremity         If you smoke STOP. We care about your health! Smoking cessation information available at your doctor's office.    Thank you for choosing [facility] for your care.    Date/time 12/11/2022 10:18 AM        FAQs  (frequently asked questions)  About "Surgical Site Infections"    What is a Surgical Site Infection (SSI)?   A surgical site infection is an infection that occurs after surgery in the part of the body where the surgery took place.  Most patients who have surgery do not develop an infection.  However, infections develop in about 1 to 3 out of every 100 patients who have surgery.   Some common symptoms of a surgical site infection are:   Redness and pain around the area where you had surgery  Drainage of cloudy fluid from your surgical wound   Fever    Can SSIs be treated?   Yes. Most surgical site infections can be treated with antibiotics.  The antibiotic given to you depends on the bacteria (germs) causing the infection.  Sometimes patients with SSIs also need another surgery to treat the infection.      What are some of the things that hospitals are doing to prevent SSIs?  To prevent SSIs, doctors, nurses and other healthcare providers:   Clean their hands and arms up to their elbows with an antiseptic agent just before the surgery.   Clean their hands with soap and water or an alcohol-based hand rub before and after caring for each patient.   May remove some of your hair immediately before your surgery using electric clippers if the hair is in the same area where the procedure will occur.  They should not shave you with a razor.   Wear special hair covers, masks, gowns, and gloves during surgery to keep the surgery area clean.  Give you antibiotics before your surgery starts.  In most cases, you should get antibiotics within 60 minutes before the surgery starts and the  antibiotics should be stopped within 24 hours after surgery.  Clean the skin at the site of your surgery with a special soap that kills germs.     What can I do to help prevent SSIs?   Before you surgery:  Tell your doctor about other medical problems you may have.  Health problems such as allergies, diabetes,  and obesity could affect your surgery and your treatment.    Quit smoking.  Patients who smoke get more infections.  Talk to your doctor about how you can quit before your surgery.  Do not shave near where you will have surgery.  Shaving with a razor can irritate your skin and make it easier to develop an infection.        At the time of your surgery:  Speak up if someone tries to shave you with a razor before surgery.  Ask why you need to be shaved and talk with your surgeon if you have any concerns.   Ask if you will get antibiotics before surgery.    After your surgery:  Make sure that your healthcare providers clean their hands before examining you, either with soap and water or an alcohol-based hand rub.   IF YOU DO NOT SEE YOUR PROVIDERS CLEAN THEIR HANDS, PLEASE ASK THEM TO DO SO.   Family and friends who visit you should not touch the surgical wound or dressings.  Family and friends should clean their hands with soap and water or an alcohol-based hand rub before and after visiting you.  If you do not see them clean their hands, ask them to clean their hands.     What do I need to do when I go home from the hospital?  Before you go home, your doctor nurses should explain everything you need to know about taking care of your wound.  Make sure you understand how to care for your wound before you leave the hospital.    Always clean your hands before and after caring for your wound.    Before you go home, make sure you know who to contact if you have questions or problems after you get home.   If you have any symptoms of an infection, such as redness and pain at the surgery site, drainage, or fever, call  your doctor immediately.     If you have additional questions, please ask your doctor or nurse.

## 2022-12-11 NOTE — Progress Notes (Signed)
Ambulatory Surgery/Procedure Discharge Note    Vitals:    12/11/22 1030   BP: (!) 166/82   Pulse: 55   Resp: 20   Temp: 96.9 F (36.1 C)   SpO2: 98%   BP meets Aldrete standard    In: 835 [P.O.:210; I.V.:625]  Out: -     Restroom use offered before discharge.  Yes    Pain assessment:  none  Pain Level: 0    Patient denies pain. Patient just states its sore. VSS. Incision is closed with skin glue and is clean, dry and intact. VSS. Patient tolerating all PO intake. Patient offered to void before discharge. Discharge instructions given to patient and patients brother over the phone. Patient is ready for discharge.    Patient discharged to home/self care. Patient discharged via wheel chair by transporter to waiting family/S.O.       12/11/2022 11:32 AM

## 2022-12-11 NOTE — Op Note (Signed)
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    Risks, benefits, and alternatives discussed with patient and any available family members in the preoperative area. Risks including but not limited to: bleeding, infection, hematoma, major vessel injury, malposition, need for re-operation or removal, and pneumothorax were discussed. All questions answered and patient consented to proceed.     PROCEDURE DETAILS:  The patient was brought to the operating theater and placed in the supine position with arms padded and tucked at sides. A towel roll was placed between the scapulae. The patient's bilateral upper chest and neck was then prepped and draped in sterile fashion using chlorhexidine solution. A time-out was performed confirming the patient's identity and operative site. Antibiotics were confirmed to be infusing. SCDs were on and functioning. All safety points were followed per hospital protocol.    Sedation was started by anesthesia provider. Patient was placed in Trendelenburg position. The left infraclavicular fossa was anesthetized with local anesthetic. The left subclavian vein was entered on the 1st attempt with return of venous blood. Guidewire was then positioned in the vena cava. This was confirmed using fluoroscopy.     4 cm pocket for the port was planned for the anterior chest wall 2-3 cm below the guidewire insertion site. The skin and tunnel tract were anesthetized with local anesthetic. Incision was then made using a 11-blade  scalpel. Electrocautery was then used to deepen the incision through subcutaneous tissue and a pocket was created in the subcutaneous tissue superficial to the fascia. Next, 11-blade scalpel was used to extend the guidewire exit site to a 1 cm incision. The catheter was then tunneled subcutaneously to the exit site of the guidewire. The dilator and sheath were placed over the guidewire in the vena cava under fluoroscopic guidance using Seldinger technique. The dilator and wire were subsequently removed. The catheter was then threaded through the sheath into the vena cava. The tip was positioned close to the cavoatrial junction, as confirmed using fluoroscopy.     The catheter was cut to appropriate length; the port/catheter locking mechanism was placed onto the catheter and the catheter was secured to the port. The port was then positioned in the pocket. The port was then accessed. Good return of venous blood was noted and flushed easily. Wounds were inspected. Good hemostasis was noted. Following this, the wounds were then closed using interrupted 3-0 Vicryl and running 4-0 monocril sutures.     The wounds were then cleaned, dried and dressed with surgical glue. All counts were correct at the end of the procedure.     The patient tolerated the procedure well and was taken to the recovery room in stable condition. Portable chest radiograph ordered for PACU.

## 2022-12-12 NOTE — Telephone Encounter (Signed)
Patient completed PORT PLACEMENT [[id number]] on 12/11/2022.    Patient noticed the area where the port is placed has been creating a wet area on her gown.     This morning when the patient was in the bathroom washing her hands, she noticed the port area is wet and blood is coming out. The area is puffy and tender. Patient denies fever, nausea, or emesis at this time.     Patient can be reached at [phone].

## 2022-12-13 NOTE — Progress Notes (Signed)
ENDOSCOPY PREOP INSTRUCTIONS      Please at arrival time given to you from your doctor's office.  Report to the MAIN entrance on [address] and register at the surgery center on the left-hand side of the lobby  You will need your insurance card and photo id and a list of all medications taken on a regular basis. Please include the dose/frequency.    For your procedure:     PLEASE FOLLOW ALL INSTRUCTIONS & PREPS GIVEN TO YOU BY YOUR DOCTOR'S OFFICE.    If you have not received these instructions yet, please call the office immediately. Make sure to read them as soon as received.   If you are taking blood thinners, Aspirin or diabetic medication, make sure to call your doctor as soon as possible for instructions prior to your procedure.  Please dress comfortably and do not wear any lotion, powders or jewelry  If you use oxygen at home, please bring your oxygen tank with you to hospital.  Arrange for someone to be with you and sign you out & drive you home after your procedure.  THIS PERSON MUST WAIT AT HOSPITAL THE ENTIRE TIME.  We allow 2 adult visitors with you in the hospital & masks are strongly recommended.    WOMEN ONLY OF CHILDBEARING AGE: Please make sure to be able to give a urine sample on arrival      If you have further questions, you may contact your Endoscopist's office or Pre Admission Testing staff at [phone]

## 2022-12-13 NOTE — Telephone Encounter (Signed)
Spoke with patient, advised her to call back if drainage continues. Per Dr. Judithann Sauger,   Some drainage is normal. Can let us know if symptoms continue and I can see her next week

## 2022-12-16 ENCOUNTER — Inpatient Hospital Stay: Admit: 2022-12-16 | Attending: Gastroenterology | Primary: Family Medicine

## 2022-12-16 ENCOUNTER — Inpatient Hospital Stay: Payer: Medicare (Managed Care) | Attending: Gastroenterology

## 2022-12-16 MED ORDER — PROPOFOL 200 MG/20ML IV EMUL
200 | INTRAVENOUS | Status: DC | PRN
Start: 2022-12-16 — End: 2022-12-16
  Administered 2022-12-16: 17:00:00 60 via INTRAVENOUS

## 2022-12-16 MED ORDER — KETAMINE HCL 20 MG/2ML IJ SOSY
20 | INTRAMUSCULAR | Status: AC
Start: 2022-12-16 — End: ?

## 2022-12-16 MED ORDER — KETAMINE HCL 20 MG/2ML IJ SOSY
20 | INTRAMUSCULAR | Status: DC | PRN
Start: 2022-12-16 — End: 2022-12-16
  Administered 2022-12-16: 17:00:00 20 via INTRAVENOUS

## 2022-12-16 MED ORDER — LIDOCAINE HCL 2 % IJ SOLN
2 | INTRAMUSCULAR | Status: DC | PRN
Start: 2022-12-16 — End: 2022-12-16
  Administered 2022-12-16: 17:00:00 100 via INTRAVENOUS

## 2022-12-16 MED ORDER — PROPOFOL 1000 MG/100ML IV EMUL
1000 | INTRAVENOUS | Status: DC | PRN
Start: 2022-12-16 — End: 2022-12-16
  Administered 2022-12-16: 17:00:00 150 via INTRAVENOUS

## 2022-12-16 MED ORDER — PROPOFOL 1000 MG/100ML IV EMUL
1000 | INTRAVENOUS | Status: DC | PRN
Start: 2022-12-16 — End: 2022-12-16

## 2022-12-16 MED ORDER — LACTATED RINGERS IV SOLN
INTRAVENOUS | Status: DC
Start: 2022-12-16 — End: 2022-12-16
  Administered 2022-12-16: 15:00:00 via INTRAVENOUS

## 2022-12-16 MED FILL — KETAMINE HCL 20 MG/2ML IJ SOSY: 20 MG/2ML | INTRAMUSCULAR | Qty: 2

## 2022-12-16 NOTE — Progress Notes (Signed)
Ambulatory Surgery/Procedure Discharge Note    Vitals:    12/16/22 1330   BP: (!) 173/77   Pulse: 78   Resp: 18   Temp:    SpO2: 97%       In: 300 [I.V.:300]  Out: -     Restroom use offered before discharge.  Yes    Pain assessment:  none  Pain Level: 0    BP within 20% pre-op aldrete score.    Patient has been seen by doctor. Discharge order obtained, and discharge instructions reviewed. Patient educated, using the teach back method, about follow up instructions and discharge instructions. A completed copy of the AVS instructions given to patient and all questions answered. IV catheter removed without complaints, catheter intact, site WNL.   Patient discharged to home/self care. Patient discharged via wheel chair by transporter to waiting family/S.O.       12/16/2022 1:50 PM

## 2022-12-16 NOTE — Anesthesia Pre-Procedure Evaluation (Addendum)
Department of Anesthesiology  Preprocedure Note       Name:  Gabrielle Wells   Age:  72 y.o.  DOB:  June 16, 1951                                          MRN:  [id number]         Date:  12/16/2022      Surgeon: Moishe Spice):  Adella Nissen, MD    Procedure: Procedure(s):  COLONOSCOPY    Medications prior to admission:   Prior to Admission medications    Medication Sig Start Date End Date Taking? Authorizing Provider   acyclovir (ZOVIRAX) 400 MG tablet Take 1 tablet by mouth 2 times daily    [provider]   dexAMETHasone (DECADRON) 4 MG tablet Take 1 tablet by mouth daily    [provider]   mirtazapine (REMERON) 15 MG tablet Take 1 tablet by mouth nightly  Patient not taking: Reported on 12/16/2022    [provider]   lenalidomide (REVLIMID) 25 MG chemo capsule Take 1 capsule by mouth daily    [provider]   predniSONE (DELTASONE) 50 MG tablet Take one tab 13 hours, 6 hours and 1 hour prior to CT Scan 11/28/22   Kizzie Furnish, MD   furosemide (LASIX) 20 MG tablet Take 2 tablets by mouth daily Take 80 mg if wt goes up 3-5lbs 06/17/22 12/16/22  Tindni, Arshdeep, MD   gabapentin (NEURONTIN) 300 MG capsule TAKE ONE CAPSULE BY MOUTH 3 TIMES DAILY 05/09/22 12/11/22  Barbee Cough, MD   carvedilol (COREG) 25 MG tablet TAKE ONE TABLET BY MOUTH TWICE DAILY  Patient taking differently: Take 0.5 tablets by mouth 2 times daily (with meals) 01/09/22   Laurette Schimke, MD   valsartan (DIOVAN) 320 MG tablet Take 1 tablet by mouth daily 12/19/21 12/11/22  Laurette Schimke, MD   empagliflozin (JARDIANCE) 10 MG tablet Take 1 tablet by mouth daily 12/19/21   Laurette Schimke, MD   atorvastatin (LIPITOR) 40 MG tablet Take 1 tablet by mouth daily 12/18/21   Cherlyn Roberts H, DO   Cholecalciferol (VITAMIN D3) 25 MCG TABS TAKE TWO TABLETS BY MOUTH DAILY 12/18/21   Cherlyn Roberts H, DO   albuterol sulfate HFA (VENTOLIN HFA) 108 (90 Base) MCG/ACT inhaler Inhale 2 puffs into the lungs 4 times daily as needed for  Wheezing 07/16/21   Roxy Manns, MD   tiZANidine (ZANAFLEX) 2 MG tablet Take 1 tablet by mouth nightly as needed (muscle spasms) 07/16/21   Roxy Manns, MD   ondansetron (ZOFRAN-ODT) 4 MG disintegrating tablet DISSOLVE 1 TABLET ON THE TONGUE EVERY 8 HOURS AS NEEDED FOR NAUSEA 07/16/21   Roxy Manns, MD   omeprazole (PRILOSEC) 20 MG delayed release capsule Take 1 capsule by mouth every morning (before breakfast) 11/28/20   Margarito Liner, MD   acetaminophen (TYLENOL) 500 MG tablet Take 1 tablet by mouth every 6 hours as needed for Pain  Patient not taking: Reported on 12/11/2022    [provider]       Current medications:    Current Facility-Administered Medications   Medication Dose Route Frequency Provider Last Rate Last Admin    lactated ringers IV soln infusion   IntraVENous Continuous Blossom Hoops, MD 50 mL/hr at 12/16/22 1129 New Bag at 12/16/22 1129  Allergies:    Allergies   Allergen Reactions    Aspirin Other (See Comments)     Stomach bleeding    Ibuprofen Other (See Comments)     Stomach bleeding    Mushroom Extract Complex Hives and Itching    Shellfish-Derived Products Hives and Itching    Sulfa Antibiotics Hives, Itching, Nausea And Vomiting and Other (See Comments)    Sulfasalazine Hives, Itching and Other (See Comments)    Vancomycin Other (See Comments)     Drug induced NEUTROPENIA    Amoxicillin-Pot Clavulanate Itching     Has never had problems with amoxicillin/ penicillin    HAS TOLERATED ZOSYN WITH ZERO PROBLEMS    Coconut Flavor Itching    Egg-Derived Products Nausea And Vomiting and Nausea Only    Iodinated Contrast Media Hives     Takes Benadryl 50mg  PO before receiving iodinated contrast    Takes Benadryl 50mg  PO before receiving iodinated contrast   Takes Benadryl 50mg  PO before receiving iodinated contrast   Takes Benadryl 50mg  PO before receiving iodinated contrast   Takes Benadryl 50mg  PO before receiving iodinated contrast    Shellfish Allergy Hives and  Itching    Acetaminophen Itching and Other (See Comments)    Amoxicillin Other (See Comments)    Coconut Oil Itching    Iodides Hives    Povidone-Iodine Itching    Ciprofloxacin Other (See Comments)     Made "bottom" raw    Coconut (Cocos Nucifera) Itching    Codeine Itching and Other (See Comments)    Fish-Derived Products Nausea And Vomiting    Iodine Itching and Other (See Comments)    Metronidazole Other (See Comments)     Made "bottom" raw    Other Rash     detergents    Povidone Iodine Hives and Other (See Comments)    Sulfacetamide Rash       Problem List:    Patient Active Problem List   Diagnosis Code    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    Chronic combined systolic and diastolic CHF, 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    Resistant hypertension I1A.0    GERD (gastroesophageal reflux disease) K21.9    Back wound S21.209A    AKI (acute kidney injury) (HCC) N17.9    CKD (chronic kidney disease), 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    Vitamin D deficiency E55.9    Hyperlipidemia E78.5    Morbid obesity (HCC) E66.01    BPPV (benign paroxysmal positional vertigo) H81.10    Fall at home, initial encounter W19.XXXA, Y92.009    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    Suspected condition R69    Acute blood loss anemia D62    Backache M54.9    Severe hypertension I10    Chronic pain disorder G89.4    Community acquired pneumonia J18.9    CSF leak G96.00    Depression F32.A    Diarrhea R19.7    Discitis of lumbosacral region M46.47    Elevated lipase R74.8    Fever and chills R50.9    Fracture of neck of left humerus, closed, initial encounter S42.212A    Fracture of T10 vertebra (HCC) S22.079A    Generalized anxiety disorder F41.1    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    Proteus infection A49.8    Humerus fracture S42.309A    Hypotensive episode I95.9    Intertrigo L30.4    Obstipation K59.00  Klebsiella infection A49.8    Labile blood pressure R09.89     Leukopenia D72.819    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, 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    Neuropathic pain M79.2    Obesity hypoventilation syndrome (HCC) E66.2    Other specified anemias D64.89    Physical deconditioning R53.81    Pill dysphagia R13.10    Postoperative pain G89.18    Protein-calorie malnutrition, severe (HCC) E43    Pruritic condition L29.9    Zoster B02.9    Wound infection after surgery T81.49XA    CHF (congestive heart failure) (HCC) I50.9    Multiple myeloma (HCC) C90.00       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    s/i I&D and removaal of hardware    GERD (gastroesophageal reflux disease)     HTN (hypertension)     Hyperlipidemia     IBS (irritable bowel syndrome)     Incontinence of urine     Lumbar radiculopathy     Morbid obesity with BMI of 45.0-49.9, adult (HCC)     Multiple allergies     Multiple myeloma (HCC)     Muscle fasciculation     Neuropathic pain     back 2/2 to spinal surgeries    Prolonged emergence from general anesthesia     SBO (small bowel obstruction) (HCC) 07/1999    with partial colectomy    Spinal fracture of T8 vertebra (HCC)     s/p fusion     Surgical site infection     back, spinal hardware     Urinary incontinence     Uses walker        Past Surgical History:        Procedure Laterality Date    ABDOMEN SURGERY N/A 05/16/2021    ABDOMINAL FAT PAD EXCISIONAL BIOPSY performed by Lucky Cowboy, MD at [facility] OR    ABDOMINAL HERNIA REPAIR  2010    APPENDECTOMY      BREAST LUMPECTOMY Bilateral     benign lesions    CARDIAC CATHETERIZATION  2012    CHOLECYSTECTOMY      COLONOSCOPY      COLONOSCOPY  05/26/2020    COLONOSCOPY POLYPECTOMY SNARE/COLD BIOPSY performed by Adella Nissen,  MD at [facility] ENDOSCOPY    COLONOSCOPY  05/26/2020    COLONOSCOPY WITH BIOPSY performed by Adella Nissen, MD at [facility] ENDOSCOPY    COLONOSCOPY N/A 11/08/2022    COLONOSCOPY WITH BIOPSY, COLD SNARE/HOT SNARE performed by Adella Nissen, MD at [facility] ENDOSCOPY    CT BONE MARROW BIOPSY  01/03/2021    CT BONE MARROW BIOPSY 01/03/2021 [facility] CT SCAN    HYSTERECTOMY, TOTAL ABDOMINAL (CERVIX REMOVED)      OVARY REMOVAL      PORT SURGERY Left 12/11/2022    LEFT SUBCLAVIAN PORT PLACEMENT performed by Kizzie Furnish, MD at [facility] OR    TOTAL KNEE ARTHROPLASTY Bilateral 2009    TOTAL SHOULDER ARTHROPLASTY W/ DISTAL CLAVICLE EXCISION Right 2010    UPPER GASTROINTESTINAL ENDOSCOPY N/A 05/26/2020  EGD BIOPSY performed by Adella Nissen, MD at [facility] ENDOSCOPY    XR MIDLINE EQUAL OR GREATER THAN 5 YEARS  07/28/2018    XR MIDLINE EQUAL OR GREATER THAN 5 YEARS 07/28/2018       Social History:    Social History     Tobacco Use    Smoking status: Never    Smokeless tobacco: Never   Substance Use Topics    Alcohol use: Never                                Counseling given: Not Answered      Vital Signs (Current):   Vitals:    12/16/22 1045   BP: (!) 166/109   Pulse: 90   Resp: 18   Temp: 98.2 F (36.8 C)   TempSrc: Temporal   SpO2: 97%   Weight: 109.8 kg (242 lb)   Height: 1.6 m (5\' 3" )                                              BP Readings from Last 3 Encounters:   12/16/22 (!) 166/109   12/11/22 (!) 166/82   11/08/22 (!) 147/68       NPO Status: Time of last liquid consumption: 0600                        Time of last solid consumption: 1900                        Date of last liquid consumption: 12/16/22                        Date of last solid food consumption: 12/14/22    BMI:   Wt Readings from Last 3 Encounters:   12/16/22 109.8 kg (242 lb)   12/11/22 114.8 kg (253 lb)   11/26/22 112 kg (247 lb)     Body mass index is 42.87 kg/m.    CBC:   Lab Results   Component Value Date/Time    WBC 4.3 07/12/2022 04:28 PM    RBC 5.65 07/12/2022  04:28 PM    HGB 15.8 07/12/2022 04:28 PM    HCT 48.6 07/12/2022 04:28 PM    MCV 86.0 07/12/2022 04:28 PM    RDW 14.3 07/12/2022 04:28 PM    PLT 103 10/01/2022 10:06 AM       CMP:   Lab Results   Component Value Date/Time    NA 142 07/12/2022 04:28 PM    K 4.1 07/12/2022 04:28 PM    CL 105 07/12/2022 04:28 PM    CO2 25 07/12/2022 04:28 PM    BUN 8 07/12/2022 04:28 PM    CREATININE 1.0 12/05/2022 11:08 AM    CREATININE 1.0 07/12/2022 04:28 PM    GFRAA >60 04/23/2021 10:46 PM    AGRATIO 1.0 05/08/2020 05:20 PM    LABGLOM 59 12/05/2022 11:08 AM    GLUCOSE 95 07/12/2022 04:28 PM    PROT 7.0 04/23/2021 10:46 PM    CALCIUM 9.8 07/12/2022 04:28 PM    BILITOT <0.2 04/23/2021 10:46 PM    ALKPHOS 78 04/23/2021 10:46 PM    AST 16 04/23/2021 10:46 PM    ALT  7 04/23/2021 10:46 PM       POC Tests: No results for input(s): "POCGLU", "POCNA", "POCK", "POCCL", "POCBUN", "POCHEMO", "POCHCT" in the last 72 hours.    Coags:   Lab Results   Component Value Date/Time    PROTIME 12.9 10/01/2022 10:06 AM    INR 0.97 10/01/2022 10:06 AM    APTT 20.2 10/01/2022 10:06 AM       HCG (If Applicable): No results found for: "PREGTESTUR", "PREGSERUM", "HCG", "HCGQUANT"     ABGs: No results found for: "PHART", "PO2ART", "PCO2ART", "HCO3ART", "BEART", "O2SATART"     Type & Screen (If Applicable):  No results found for: "LABABO", "LABRH"    Drug/Infectious Status (If Applicable):  No results found for: "HIV", "HEPCAB"    COVID-19 Screening (If Applicable):   Lab Results   Component Value Date/Time    COVID19 NOT DETECTED 04/23/2021 11:51 PM           Anesthesia Evaluation  Patient summary reviewed   no history of anesthetic complications:   Airway: Mallampati: II  TM distance: >3 FB   Neck ROM: limited  Mouth opening: > = 3 FB   Dental:          Pulmonary: breath sounds clear to auscultation  (+)           asthma:     (-) COPD, rhonchi, wheezes and no decreased breath sounds                           Cardiovascular:  Exercise tolerance: poor (<4  METS)  (+) hypertension:    (-) past MI, CABG/stent and murmur      Rhythm: regular  Rate: normal                 ROS comment: ECHO 12/11/21:    Study Conclusions     - Left ventricle: The cavity size is normal. Wall thickness is normal. Systolic function was mildly     reduced. The estimated ejection fraction was in the range of 45% to 50%. Although no diagnostic     regional wall motion abnormality was identified, this possibility cannot be completely excluded on     the basis of this study. Doppler parameters are consistent with abnormal left ventricular     relaxation (grade 1 diastolic dysfunction). There is no evidence of a thrombus revealed by     acoustic contrast opacification.   - Right ventricle: Systolic function was reduced by visual assessment.   - Atrial septum: The agitated saline study is very suboptimum and an atrial shunt cannot be excluded     based on the quality of the bubble study.   - Pericardium, extracardiac: A trivial pericardial effusion is identified.     Impressions:     - Poor image quality limits interpretation. No prior for comparison.   - Consider alternate imaging modality( cardiac MRI) if clinically indicated        Neuro/Psych:   (+) depression/anxiety    (-) seizures, TIA and CVA           GI/Hepatic/Renal:   (+) GERD:, renal disease: CRI, bowel prep, morbid obesity          Endo/Other:    (+) Diabetes.    (-) hypothyroidism, hyperthyroidism               Abdominal:   (+) obese          Vascular:  Other Findings:             Anesthesia Plan      MAC     ASA 3       Induction: intravenous.      Anesthetic plan and risks discussed with patient.      Plan discussed with CRNA.    Attending anesthesiologist reviewed and agrees with Preprocedure content                Jamesetta Orleans Eugina Row, DO   12/16/2022

## 2022-12-16 NOTE — Discharge Instructions (Signed)
COLONOSCOPY DISCHARGE INSTRUCTIONS:    Call the physician that did your procedure for any questions or concern:    [facility]: [phone]  DR. DAVID WENZKE      ACTIVITY:    There are potential side effects to the medications used for sedation and anesthesia during your procedure.  These include:  Dizziness or light-headedness, confusion or memory loss, delayed reaction times, loss of coordination, nausea and vomiting.  Because of your increased risk for injury, we ask that you observe the following precautions:  For the next 24 hours,  DO NOT operate an automobile, bicycle, motorcycle, lawn mower, power tools or large equipment of any kind.  Do not drink alcohol, sign any legal documents or make any legal decisions for 24 hours.  Do not bend your head over lower than your heart.  DO sit on the side of bed/couch awhile before getting up.  Plan on bedrest or quiet relaxation today.  You may resume normal activities in 24 hours.    DIET:    Your first meal today should be light, avoiding spicy and fatty foods.  If you tolerate this first meal, then you may advance to your regular diet unless otherwise advised by your physician.    NORMAL SYMPTOMS:  -Mild sore throat if you've had an EGD   -Gaseous discomfort    NOTIFY YOUR PHYSICIAN IF THESE SYMPTOMS OCCUR:  1. Fever (greater than 100)  5. Increased abdominal bloating  2. Severe pain    6. Excessive bleeding  3. Nausea and vomiting  7. Chest pain                                                                    4. Chills    8. Shortness of breath    ADDITIONAL INSTRUCTIONS:    Recommendations:  Await pathology results     David J. Wenzke, MD  [facility]           Please review these discharge instructions this evening or tomorrow for  information you may have forgotten.            We want to thank you for choosing the [facility] as your health care provider. We always strive to provide you with excellent care while you are here. You may receive a  survey in the mail regarding your care. We would appreciate you taking a few minutes of your time to complete this survey.

## 2022-12-16 NOTE — H&P (Signed)
Gastroenterology Note                 Pre-operative History and Physical    Patient: Gabrielle Wells  DOB: 08-21-1950  CSN:     History Obtained From:   Patient or guardian.      HISTORY OF PRESENT ILLNESS:    The patient is a 72 y.o. female here for colonoscopy for colon polyp in cecum + for adenoCA.  Deemed not a surgical candidate per Barrat b/c too high risk.  Need bx for amyloid as well.     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    s/i I&D and removaal of hardware    GERD (gastroesophageal reflux disease)     HTN (hypertension)     Hyperlipidemia     IBS (irritable bowel syndrome)     Incontinence of urine     Lumbar radiculopathy     Morbid obesity with BMI of 45.0-49.9, adult (HCC)     Multiple allergies     Multiple myeloma (HCC)     Muscle fasciculation     Neuropathic pain     back 2/2 to spinal surgeries    Prolonged emergence from general anesthesia     SBO (small bowel obstruction) (HCC) 07/1999    with partial colectomy    Spinal fracture of T8 vertebra (HCC)     s/p fusion     Surgical site infection     back, spinal hardware     Urinary incontinence     Uses walker      Past Surgical History:    Past Surgical History:   Procedure Laterality Date    ABDOMEN SURGERY N/A 05/16/2021    ABDOMINAL FAT PAD EXCISIONAL BIOPSY performed by Lucky Cowboy, MD at [facility] OR    ABDOMINAL HERNIA REPAIR  2010    APPENDECTOMY      BREAST LUMPECTOMY Bilateral     benign lesions    CARDIAC CATHETERIZATION  2012    CHOLECYSTECTOMY      COLONOSCOPY      COLONOSCOPY  05/26/2020    COLONOSCOPY POLYPECTOMY SNARE/COLD BIOPSY performed by Adella Nissen, MD at [facility] ENDOSCOPY    COLONOSCOPY  05/26/2020    COLONOSCOPY WITH BIOPSY performed by Adella Nissen, MD at [facility] ENDOSCOPY    COLONOSCOPY N/A 11/08/2022    COLONOSCOPY WITH BIOPSY, COLD SNARE/HOT SNARE performed by Adella Nissen, MD at [facility] ENDOSCOPY    CT BONE MARROW BIOPSY  01/03/2021    CT BONE MARROW BIOPSY 01/03/2021 [facility] CT SCAN    HYSTERECTOMY, TOTAL ABDOMINAL (CERVIX REMOVED)      OVARY REMOVAL      PORT SURGERY Left 12/11/2022    LEFT SUBCLAVIAN PORT PLACEMENT performed by Kizzie Furnish, MD at [facility] OR    TOTAL KNEE ARTHROPLASTY Bilateral 2009    TOTAL SHOULDER ARTHROPLASTY W/ DISTAL CLAVICLE EXCISION Right 2010    UPPER GASTROINTESTINAL ENDOSCOPY N/A 05/26/2020    EGD BIOPSY performed by Adella Nissen, MD at [facility] ENDOSCOPY    XR MIDLINE EQUAL OR GREATER THAN 5 YEARS  07/28/2018  XR MIDLINE EQUAL OR GREATER THAN 5 YEARS 07/28/2018     Medications Prior to Admission:   No current facility-administered medications on file prior to encounter.     Current Outpatient Medications on File Prior to Encounter   Medication Sig Dispense Refill    acyclovir (ZOVIRAX) 400 MG tablet Take 1 tablet by mouth 2 times daily      dexAMETHasone (DECADRON) 4 MG tablet Take 1 tablet by mouth daily      mirtazapine (REMERON) 15 MG tablet Take 1 tablet by mouth nightly (Patient not taking: Reported on 12/16/2022)      lenalidomide (REVLIMID) 25 MG chemo capsule Take 1 capsule by mouth daily      predniSONE (DELTASONE) 50 MG tablet Take one tab 13 hours, 6 hours and 1 hour prior to CT Scan 3 tablet 0    furosemide (LASIX) 20 MG tablet Take 2 tablets by mouth daily Take 80 mg if wt goes up 3-5lbs 30 tablet 1    gabapentin (NEURONTIN) 300 MG capsule TAKE ONE CAPSULE BY MOUTH 3 TIMES DAILY 90 capsule 0    carvedilol (COREG) 25 MG tablet TAKE ONE TABLET BY MOUTH TWICE DAILY (Patient taking differently: Take 0.5 tablets by mouth 2 times daily (with meals)) 180 tablet 3    valsartan (DIOVAN) 320 MG tablet Take 1 tablet by mouth daily 90 tablet 3    empagliflozin (JARDIANCE) 10 MG tablet Take 1 tablet by mouth daily 90 tablet 3     atorvastatin (LIPITOR) 40 MG tablet Take 1 tablet by mouth daily 90 tablet 1    Cholecalciferol (VITAMIN D3) 25 MCG TABS TAKE TWO TABLETS BY MOUTH DAILY 60 tablet 2    albuterol sulfate HFA (VENTOLIN HFA) 108 (90 Base) MCG/ACT inhaler Inhale 2 puffs into the lungs 4 times daily as needed for Wheezing 18 g 0    tiZANidine (ZANAFLEX) 2 MG tablet Take 1 tablet by mouth nightly as needed (muscle spasms) 30 tablet 1    ondansetron (ZOFRAN-ODT) 4 MG disintegrating tablet DISSOLVE 1 TABLET ON THE TONGUE EVERY 8 HOURS AS NEEDED FOR NAUSEA 21 tablet 0    omeprazole (PRILOSEC) 20 MG delayed release capsule Take 1 capsule by mouth every morning (before breakfast) 90 capsule 1    acetaminophen (TYLENOL) 500 MG tablet Take 1 tablet by mouth every 6 hours as needed for Pain (Patient not taking: Reported on 12/11/2022)          Allergies:  Aspirin, Ibuprofen, Mushroom extract complex, Shellfish-derived products, Sulfa antibiotics, Sulfasalazine, Vancomycin, Amoxicillin-pot clavulanate, Coconut flavor, Egg-derived products, Iodinated contrast media, Shellfish allergy, Acetaminophen, Amoxicillin, Coconut oil, Iodides, Povidone-iodine, Ciprofloxacin, Coconut (cocos nucifera), Codeine, Fish-derived products, Iodine, Metronidazole, Other, Povidone iodine, and Sulfacetamide      Social History:   Social History     Tobacco Use    Smoking status: Never    Smokeless tobacco: Never   Substance Use Topics    Alcohol use: Never     Family History:   Family History   Problem Relation Age of Onset    Cancer Paternal Grandmother         unk     Breast Cancer Maternal Aunt        PHYSICAL EXAM:      BP (!) 166/109   Pulse 90   Temp 98.2 F (36.8 C) (Temporal)   Resp 18   Ht 1.6 m (5\' 3" )   Wt 109.8 kg (242 lb)   SpO2 97%   BMI 42.87  kg/m  I        Heart:  RRR, normal s1s2    Lungs:  CTA and normal effort    Abdomen:   Soft, nt nd.         ASSESSMENT AND PLAN:    1.  Patient is a 72 y.o. female here for endoscopy with MAC sedation.     2.  Procedure options, risks and benefits reviewed with patient and/or guardian.  They express understanding.

## 2022-12-16 NOTE — Anesthesia Post-Procedure Evaluation (Signed)
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    Aldrete Phase I: Aldrete Score: 10    Aldrete Phase II: Aldrete Score: 10    Vitals:    12/16/22 1330   BP: (!) 173/77   Pulse: 78   Resp: 18   Temp:    SpO2: 97%       Anesthesia Post Evaluation    Patient location during evaluation: PACU  Patient participation: complete - patient participated  Level of consciousness: awake and awake and alert  Pain score: 0  Airway patency: patent  Nausea & Vomiting: no nausea and no vomiting  Cardiovascular status: hemodynamically stable  Respiratory status: acceptable  Hydration status: euvolemic  Pain management: adequate and satisfactory to patient        No notable events documented.

## 2022-12-16 NOTE — Procedures (Signed)
[facility]  Colonoscopy Note    Patient: Gabrielle Wells  DOB: [birth year]  Acct#:     Procedure: Colonoscopy with biopsy    Date:  12/16/2022    Surgeon:  Adella Nissen, MD    Referring Physician:  Adella Nissen, MD    Anesthesia: IV propofol, per anesthesia    EBL: <50 mL    Indications: cecal polyp with adenocarcinoma    Procedure:     An informed consent was obtained from the patient after explanation of indications, benefits, possible risks and complications of the procedure.  The patient was then taken to the endoscopy suite, placed in the left lateral decubitus position, and the above IV anesthesia was administered.    A digital rectal examination was performed and revealed negative without mass, lesions or tenderness.      The Olympus PCFQ-H190 video colonoscope was placed in the patient's rectum under digital direction and advanced to the cecum. The cecum was identified by characteristic anatomy and ballottment.  The prep was poor.      Findings:  Poor prep with stool throughout the entire colon which precluded visualization.  The polypectomy site was seen in the cecum.  No residual polyp tissue.  Multiple biopsies obtained from the polypectomy site to look for any residual adenocarcinoma.  Random colon biopsies taken from the right colon to evaluate for amyloidosis.      The scope was then withdrawn into the rectum and retroflexed.  The retroflexed view of the anal verge and rectum demonstrates small hemorrhoids.     The scope was straightened, the colon was decompressed and the scope was withdrawn from the patient.      The patient tolerated the procedure well and was taken to the PACU in good condition.    Biopsies: yes      Impression:   Poor prep with stool throughout the entire colon which precluded visualization.  The polypectomy site was seen in the cecum.  No residual polyp tissue.  Multiple biopsies obtained from the polypectomy site to look for any residual  adenocarcinoma.  Random colon biopsies taken from the right colon to evaluate for amyloidosis.    Recommendations:  Await pathology results    Adella Nissen, MD  [facility]

## 2022-12-26 MED FILL — FAMOTIDINE 20MG TABS: 20 20 MG | ORAL | 1 days supply | Qty: 1 | Fill #0 | Status: AC

## 2022-12-26 MED FILL — DIPHENHIST 25MG CAPS: 25 25 MG | ORAL | 1 days supply | Qty: 1 | Fill #0 | Status: AC

## 2022-12-27 ENCOUNTER — Inpatient Hospital Stay
Admit: 2022-12-27 | Payer: MEDICARE | Attending: Student in an Organized Health Care Education/Training Program | Primary: Family Medicine

## 2022-12-27 ENCOUNTER — Encounter

## 2022-12-27 DIAGNOSIS — C180 Malignant neoplasm of cecum: Secondary | ICD-10-CM

## 2022-12-27 LAB — RESPIRATORY PANEL, MOLECULAR, WITH COVID-19: Respiratory Panel PCR: POSITIVE

## 2022-12-27 LAB — RESPIRATORY PANEL FILM ARRAY REPORT

## 2022-12-27 MED ORDER — IOPAMIDOL 76 % IV SOLN
76 | Freq: Once | INTRAVENOUS | Status: AC | PRN
Start: 2022-12-27 — End: 2022-12-27
  Administered 2022-12-27: 15:00:00 100 mL via INTRAVENOUS

## 2022-12-31 LAB — CULTURE, BLOOD 1: Blood Culture, Routine: NO GROWTH

## 2023-01-09 ENCOUNTER — Encounter: Payer: MEDICARE | Attending: Nephrology | Primary: Family Medicine

## 2023-01-16 ENCOUNTER — Encounter: Payer: MEDICARE | Attending: Nephrology | Primary: Family Medicine

## 2023-01-30 ENCOUNTER — Encounter

## 2023-02-04 ENCOUNTER — Encounter: Payer: MEDICARE | Attending: Nephrology | Primary: Family Medicine

## 2023-02-04 ENCOUNTER — Inpatient Hospital Stay
Admit: 2023-02-04 | Payer: MEDICARE | Attending: Student in an Organized Health Care Education/Training Program | Primary: Family Medicine

## 2023-02-04 DIAGNOSIS — Z452 Encounter for adjustment and management of vascular access device: Secondary | ICD-10-CM

## 2023-02-04 LAB — CBC
Hematocrit: 45 % (ref 36.0–48.0)
Hemoglobin: 14.6 g/dL (ref 12.0–16.0)
MCH: 29 pg (ref 26.0–34.0)
MCHC: 32.4 g/dL (ref 31.0–36.0)
MCV: 89.6 fL (ref 80.0–100.0)
MPV: 10.5 fL (ref 5.0–10.5)
Platelets: 82 10*3/uL — ABNORMAL LOW (ref 135–450)
RBC: 5.02 M/uL (ref 4.00–5.20)
RDW: 15.1 % (ref 12.4–15.4)
WBC: 4.8 10*3/uL (ref 4.0–11.0)

## 2023-02-04 LAB — PROTIME-INR
INR: 1.02 (ref 0.85–1.15)
Protime: 13.6 s (ref 11.9–14.9)

## 2023-02-04 MED ORDER — HEPARIN SOD (PORK) LOCK FLUSH 100 UNIT/ML IV SOLN
100 | INTRAVENOUS | Status: AC
Start: 2023-02-04 — End: ?

## 2023-02-04 MED ORDER — CEFAZOLIN SODIUM 1 G IJ SOLR
1 | INTRAMUSCULAR | Status: AC
Start: 2023-02-04 — End: ?

## 2023-02-04 MED ORDER — FENTANYL CITRATE (PF) 100 MCG/2ML IJ SOLN
100 | INTRAMUSCULAR | Status: AC | PRN
Start: 2023-02-04 — End: 2023-02-04
  Administered 2023-02-04: 20:00:00 50 via INTRAVENOUS
  Administered 2023-02-04: 20:00:00 25 via INTRAVENOUS

## 2023-02-04 MED ORDER — DIPHENHYDRAMINE HCL 50 MG/ML IJ SOLN
50 | INTRAMUSCULAR | Status: AC | PRN
Start: 2023-02-04 — End: 2023-02-04
  Administered 2023-02-04: 20:00:00 12.5 via INTRAVENOUS

## 2023-02-04 MED ORDER — LIDOCAINE-EPINEPHRINE 2 %-1:100000 IJ SOLN
INTRAMUSCULAR | Status: AC
Start: 2023-02-04 — End: ?

## 2023-02-04 MED ORDER — HEPARIN (PORCINE) IN NACL 1000-0.9 UT/500ML-% IV SOLN
INTRAVENOUS | Status: AC
Start: 2023-02-04 — End: ?

## 2023-02-04 MED ORDER — LIDOCAINE HCL 1% INJ (MIXTURES ONLY)
1 | INTRAMUSCULAR | Status: AC
Start: 2023-02-04 — End: ?

## 2023-02-04 MED ORDER — FENTANYL CITRATE (PF) 100 MCG/2ML IJ SOLN
100 | INTRAMUSCULAR | Status: AC
Start: 2023-02-04 — End: ?

## 2023-02-04 MED ORDER — STERILE WATER FOR INJECTION (MIXTURES ONLY)
1 | INTRAMUSCULAR | Status: AC | PRN
Start: 2023-02-04 — End: 2023-02-04
  Administered 2023-02-04: 20:00:00 1000 via INTRAVENOUS

## 2023-02-04 MED ORDER — MIDAZOLAM HCL 2 MG/2ML IJ SOLN
2 | INTRAMUSCULAR | Status: AC | PRN
Start: 2023-02-04 — End: 2023-02-04
  Administered 2023-02-04 (×2): 1 via INTRAVENOUS

## 2023-02-04 MED ORDER — MIDAZOLAM HCL 2 MG/2ML IJ SOLN
2 | INTRAMUSCULAR | Status: AC
Start: 2023-02-04 — End: ?

## 2023-02-04 MED ORDER — STERILE WATER FOR INJECTION IJ SOLN
INTRAMUSCULAR | Status: AC
Start: 2023-02-04 — End: ?

## 2023-02-04 MED FILL — STERILE WATER FOR INJECTION IJ SOLN: INTRAMUSCULAR | Qty: 10

## 2023-02-04 MED FILL — FENTANYL CITRATE (PF) 100 MCG/2ML IJ SOLN: 100 MCG/2ML | INTRAMUSCULAR | Qty: 2

## 2023-02-04 MED FILL — HEPARIN SOD (PORK) LOCK FLUSH 100 UNIT/ML IV SOLN: 100 UNIT/ML | INTRAVENOUS | Qty: 5

## 2023-02-04 MED FILL — LIDOCAINE HCL (PF) 1 % IJ SOLN: 1 % | INTRAMUSCULAR | Qty: 30

## 2023-02-04 MED FILL — XYLOCAINE/EPINEPHRINE 2 %-1:100000 IJ SOLN: 2 %-1:100000 | INTRAMUSCULAR | Qty: 20

## 2023-02-04 MED FILL — CEFAZOLIN SODIUM 1 G IJ SOLR: 1 g | INTRAMUSCULAR | Qty: 1000

## 2023-02-04 MED FILL — MIDAZOLAM HCL 2 MG/2ML IJ SOLN: 2 MG/ML | INTRAMUSCULAR | Qty: 2

## 2023-02-04 MED FILL — HEPARIN (PORCINE) IN NACL 1000-0.9 UT/500ML-% IV SOLN: INTRAVENOUS | Qty: 500

## 2023-02-04 NOTE — Progress Notes (Signed)
Patient/family given discharge instructions. Patient/family verbalize understanding of discharge instructions, all questions addressed, copy given to patient/family. Pt transferred to vehicle via wheelchair to be discharged home with family.

## 2023-02-04 NOTE — Pre Sedation (Signed)
Patient:  Gabrielle Wells   DOB:   1951-01-01      Relevant clinical history, particularly as it involves the pending procedure, was reviewed and discussed.    The procedure including risks, benefits, and alternatives was discussed at length with the patient (or designated family member) and all questions were answered.  Informed consent to proceed with the procedure was given.    Vital signs were monitored and documented by the Radiology nurse.    Targeted physical examination  Heart : regular rate and rhythm  Lungs : clear, breathing easily  Condition : stable    [facility] nurses notes reviewed and agreed.    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    s/i I&D and removaal of hardware    GERD (gastroesophageal reflux disease)     HTN (hypertension)     Hyperlipidemia     IBS (irritable bowel syndrome)     Incontinence of urine     Lumbar radiculopathy     Morbid obesity with BMI of 45.0-49.9, adult (HCC)     Multiple allergies     Multiple myeloma (HCC)     Muscle fasciculation     Neuropathic pain     back 2/2 to spinal surgeries    Prolonged emergence from general anesthesia     SBO (small bowel obstruction) (HCC) 07/1999    with partial colectomy    Spinal fracture of T8 vertebra (HCC)     s/p fusion     Surgical site infection     back, spinal hardware     Urinary incontinence     Uses walker        Past Surgical History:           Procedure Laterality Date    ABDOMEN SURGERY N/A 05/16/2021    ABDOMINAL FAT PAD EXCISIONAL BIOPSY performed by Lucky Cowboy, MD at [facility] OR    ABDOMINAL HERNIA REPAIR  2010    APPENDECTOMY      BREAST LUMPECTOMY Bilateral     benign lesions    CARDIAC CATHETERIZATION  2012    CHOLECYSTECTOMY      COLONOSCOPY      COLONOSCOPY  05/26/2020     COLONOSCOPY POLYPECTOMY SNARE/COLD BIOPSY performed by Adella Nissen, MD at [facility] ENDOSCOPY    COLONOSCOPY  05/26/2020    COLONOSCOPY WITH BIOPSY performed by Adella Nissen, MD at [facility] ENDOSCOPY    COLONOSCOPY N/A 11/08/2022    COLONOSCOPY WITH BIOPSY, COLD SNARE/HOT SNARE performed by Adella Nissen, MD at [facility] ENDOSCOPY    COLONOSCOPY N/A 12/16/2022    COLONOSCOPY BIOPSY performed by Adella Nissen, MD at [facility] ENDOSCOPY    CT BONE MARROW BIOPSY  01/03/2021    CT BONE MARROW BIOPSY 01/03/2021 [facility] CT SCAN    HYSTERECTOMY, TOTAL ABDOMINAL (CERVIX REMOVED)      OVARY REMOVAL      PORT SURGERY Left 12/11/2022    LEFT SUBCLAVIAN PORT PLACEMENT performed by Kizzie Furnish, MD at [facility] OR    TOTAL KNEE ARTHROPLASTY Bilateral 2009    TOTAL SHOULDER ARTHROPLASTY W/ DISTAL CLAVICLE EXCISION  Right 2010    UPPER GASTROINTESTINAL ENDOSCOPY N/A 05/26/2020    EGD BIOPSY performed by Adella Nissen, MD at [facility] ENDOSCOPY    XR MIDLINE EQUAL OR GREATER THAN 5 YEARS  07/28/2018    XR MIDLINE EQUAL OR GREATER THAN 5 YEARS 07/28/2018       Allergies:  Aspirin, Ibuprofen, Mushroom extract complex, Shellfish-derived products, Sulfa antibiotics, Sulfasalazine, Vancomycin, Amoxicillin-pot clavulanate, Coconut flavor, Egg-derived products, Iodinated contrast media, Shellfish allergy, Acetaminophen, Amoxicillin, Coconut oil, Iodides, Povidone-iodine, Ciprofloxacin, Coconut (cocos nucifera), Codeine, Fish-derived products, Iodine, Metronidazole, Other, Povidone iodine, and Sulfacetamide    Medications:   Home Meds  Current Outpatient Medications on File Prior to Encounter   Medication Sig Dispense Refill    acyclovir (ZOVIRAX) 400 MG tablet Take 1 tablet by mouth 2 times daily      dexAMETHasone (DECADRON) 4 MG tablet Take 1 tablet by mouth daily      mirtazapine (REMERON) 15 MG tablet Take 1 tablet by mouth nightly (Patient not taking: Reported on 12/16/2022)      lenalidomide (REVLIMID) 25 MG chemo capsule Take 1 capsule by mouth daily       predniSONE (DELTASONE) 50 MG tablet Take one tab 13 hours, 6 hours and 1 hour prior to CT Scan 3 tablet 0    furosemide (LASIX) 20 MG tablet Take 2 tablets by mouth daily Take 80 mg if wt goes up 3-5lbs 30 tablet 1    gabapentin (NEURONTIN) 300 MG capsule TAKE ONE CAPSULE BY MOUTH 3 TIMES DAILY 90 capsule 0    carvedilol (COREG) 25 MG tablet TAKE ONE TABLET BY MOUTH TWICE DAILY (Patient taking differently: Take 0.5 tablets by mouth 2 times daily (with meals)) 180 tablet 3    valsartan (DIOVAN) 320 MG tablet Take 1 tablet by mouth daily 90 tablet 3    empagliflozin (JARDIANCE) 10 MG tablet Take 1 tablet by mouth daily 90 tablet 3    atorvastatin (LIPITOR) 40 MG tablet Take 1 tablet by mouth daily 90 tablet 1    Cholecalciferol (VITAMIN D3) 25 MCG TABS TAKE TWO TABLETS BY MOUTH DAILY 60 tablet 2    albuterol sulfate HFA (VENTOLIN HFA) 108 (90 Base) MCG/ACT inhaler Inhale 2 puffs into the lungs 4 times daily as needed for Wheezing 18 g 0    tiZANidine (ZANAFLEX) 2 MG tablet Take 1 tablet by mouth nightly as needed (muscle spasms) 30 tablet 1    ondansetron (ZOFRAN-ODT) 4 MG disintegrating tablet DISSOLVE 1 TABLET ON THE TONGUE EVERY 8 HOURS AS NEEDED FOR NAUSEA 21 tablet 0    omeprazole (PRILOSEC) 20 MG delayed release capsule Take 1 capsule by mouth every morning (before breakfast) 90 capsule 1    acetaminophen (TYLENOL) 500 MG tablet Take 1 tablet by mouth every 6 hours as needed for Pain (Patient not taking: Reported on 12/11/2022)       No current facility-administered medications on file prior to encounter.       Current Meds  ceFAZolin (ANCEF) 1,000 mg in sterile water 10 mL IV syringe, PRN          ASA 2 - Patient with mild systemic disease with no functional limitations    III (soft palate, base of uvula visible)    Activity:  2 - Able to move 4 extremities voluntarily on command  Respiration:  2 - Able to breathe deeply and cough freely  Circulation:  2 - BP+/- of normal  Consciousness:  2 - Fully  awake  Oxygen Saturation (color):  2 - Able to maintain oxygen saturation >92% on room air    Sedation : Moderate sedation planned    HPI / Treatment plan :   Right chest port placement  Left chest port removal

## 2023-02-04 NOTE — Discharge Instructions (Signed)
[facility]  Cardiovascular Special Procedures  Port Removal  Patient Discharge Instructions      Maintain dressing after the procedure for 48 - 72 hours and then apply a new dressing supplied to you.  Leave dressing on for a total of 4-5 days.  If the dressing becomes moist, you should change the dressing.  If there is any leakage at the incision site, notify your doctor.      Keep site clean and dry for five (5) days and observe for any signs of infection.  Leave steri-strips (strips of tape along the incision) on until the edges become loose.  The steri-strips can then be peeled off the skin.      Bruising and mild discomfort are expected.  You may apply an ice bag to the incision to minimize bruising, swelling, or discomfort.      Call for any bleeding or extreme pain.  Other symptoms to report are as follows:    Fever  Skin warm to touch  Numbness or weakness  Swelling of neck or arm  Redness or Tenderness  Drainage or if dressing is damp or saturated    Refrain from heavy lifting greater than 10 pounds for 10-14 days.      Strenuous activities and exercise should be approached gradually.        You may contact [facility] for any questions or problems that may occur at [phone] during the hours of 9am-4pm Monday-Friday, or the hospital operator after hours at [phone], to have the interventional radiologist on call paged.          [facility]  Cardiovascular Special Procedures  IMPLANTABLE VENOUS ACCESS DEVICE          Patient Information:     Maintain dressing after the procedure for 24 - 48 hours, then remove dressing. Do not use any ointment. If there is any leakage at the incision site, notify your doctor.    Keep site clean and dry for 7 days and observe for any signs of infection.      Bruising and mild discomfort are expected. You may apply an ice bag to the incision area to minimize bruising, discomfort, and swelling.    Contact your physician who placed the  Port for any bleeding or extreme pain. Other symptoms to report are as follows:     Fever.     Skin warm to touch.     Numbness or weakness.     Swelling of neck or arm.     Redness or Tenderness along the portal site and/or the catheter tract.     Drainage from the portal site, or if dressing is damp or saturated.     Refrain from heavy lifting for greater than 10 pounds for 10-14 days.  This is important while the incision is healing.  Strenuous activities and exercise should be approached gradually.    You may shower as long as you keep the incision dry for the first 4 days.              The incision should not be immersed in water until it is completely healed. No baths or swimming until healed.    Most of the sutures will be absorbed over the next few weeks.  If you have                     sutures that need to be removed, we will arrange this  with you. If you can see            any sutures 4 weeks after the port was placed, please call us and we will  remove them     Caring for the Device:    When not in use, your nurse will flush the catheter monthly with a heparinized saline solution to prevent the catheter from occluding. When in use, regular needle changes and cleaning of the skin around the portal are necessary and will be performed by your nurse.       You may contact [facility] for any questions or problems that may occur at [phone] during the hours of 9am-4pm Monday-Friday, or the hospital operator after hours at [phone], to have the interventional radiologist on call paged.      [facility]  Cardiovascular Special Procedures  General Discharge Instructions    __x__ You may be drowsy or lightheaded after receiving sedation. DO NOT operate a vehicle (automobile, bicycle, motorcycle, machinery, or power tools), make any important decisions or sign any important/legal documents, or drink alcoholic beverages for the next 24 hours  __x__ We strongly suggest that a  responsible adult be with you for the next 24 hours for your protection and safety  __x__ If the intravenous catheter site is painful, apply warm wet compresses on the site until the soreness is relieved and elevate the arm above the heart.  Call your physician if no improvement in 2 to 3 days    DIETARY INSTRUCTIONS:    ____ Drink extra fluids over the next 24 hours (If not contraindicated by illness or by physician order)  ____ Start with clear liquids and progress to normal diet as you feel like eating.  If you experience nausea or repeated episodes of vomiting, which persist beyond 12-24 hours, notify your doctor        ____ Resume your previous diet    ACTIVITY INSTRUCTIONS:    ____ See other instructions  ____ No special instructions  ____ Rest for 24 hours    ____ Up as tolerated  ____ Increase activity as tolerated    Wound/Dressing Instructions:  __x__ See other instructions  ____ May shower, tomorrow  ____ Remove bandage within 24 hours    MEDICATION INSTRUCTIONS:    ____ See Medication Reconciliation Sheet      FOLLOW-UP APPOINTMENT    Follow up with MD as directed.    Belongings returned to patient and/or family: Yes.    The Discharge Instructions have been explained to me.  I understand and can verbalize these instructions.

## 2023-02-04 NOTE — Progress Notes (Addendum)
No answer and unable to leave message at the number provided

## 2023-02-05 LAB — IR CONTRAST INJECTION  EXISTING CV ACCESS DEVICE EVALUATION W FLUORO: Body Surface Area: 2.24 m2

## 2023-02-11 NOTE — Telephone Encounter (Signed)
Pt cancelled her appt on 6/18. It has been rescheduled.

## 2023-02-11 NOTE — Telephone Encounter (Signed)
-----   Message from Allegra Grana, MD sent at 02/04/2023  4:07 PM EDT -----  She will come July 23 - 2 pm

## 2023-03-11 ENCOUNTER — Encounter: Admit: 2023-03-11 | Discharge: 2023-03-11 | Payer: MEDICARE | Attending: Nephrology | Primary: Family Medicine

## 2023-03-11 DIAGNOSIS — I5032 Chronic diastolic (congestive) heart failure: Secondary | ICD-10-CM

## 2023-03-11 NOTE — Progress Notes (Signed)
Jerene Dilling MD   Allegra Grana MD FASN [facility]       [facility]      Nephrology Consult Note    Reason for Consult:  AKI     Chief Complaint:  CHF   History Obtained From:  Patient     03/11/23     Pt has CKD 3   Diagnosed with MM on chemo   GFR in good range     Has been diagnosed with PE - on [facility]      has chronic CHF  on Jardiance 10 mg daily    Weighing herself daily  Weight stable  On Lasix 40 mg daily PRN - for weight > 240 lbs      Pt had a bone marrow biopsy - diagnosed with MM now    Pt had a PET scan done   No amyloid on fat pad biopsy           Wt Readings from Last 3 Encounters:   03/11/23 109.8 kg (242 lb)   02/04/23 113.4 kg (250 lb)   12/16/22 109.8 kg (242 lb)       History of Present Illness:    This is a 72 y.o. female who presents to the office for evaluation of  AKI on CKD 3   Pt has CHF was on diuretics   Off now for a week   BNP 200    BP low   On high dose Neurontin           Pt denies any hx of heavy or prolonged NSAID use.   There is no hx of jaundice or hepatitis or sexually transmitted disease.   Pt has no hx of collagen vascular disease or vasculitis.  No history of dysuria or frequency.No recent procedures involving IV contrast.   There is no hx of paraprotein disease.   Pt denies any hx of recurrent UTI , incontinence or nocturia or recurrent nephrolithiasis. No unusual skin rashes .   No tea coloured urine .   Medication reviewed and noted the use of ace/arb, diuretic.    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    s/i I&D and removaal of hardware    GERD (gastroesophageal reflux disease)     HTN (hypertension)     Hyperlipidemia     IBS (irritable bowel syndrome)     Incontinence of urine     Lumbar  radiculopathy     Morbid obesity with BMI of 45.0-49.9, adult (HCC)     Multiple allergies     Multiple myeloma (HCC)     Muscle fasciculation     Neuropathic pain     back 2/2 to spinal surgeries    Prolonged emergence from general anesthesia     SBO (small bowel obstruction) (HCC) 07/1999    with partial colectomy    Spinal fracture of T8 vertebra (HCC)     s/p fusion     Surgical site infection     back, spinal hardware     Urinary incontinence     Uses walker  Past Surgical History:        Procedure Laterality Date    ABDOMEN SURGERY N/A 05/16/2021    ABDOMINAL FAT PAD EXCISIONAL BIOPSY performed by Lucky Cowboy, MD at [facility] OR    ABDOMINAL HERNIA REPAIR  2010    APPENDECTOMY      BREAST LUMPECTOMY Bilateral     benign lesions    CARDIAC CATHETERIZATION  2012    CHOLECYSTECTOMY      COLONOSCOPY      COLONOSCOPY  05/26/2020    COLONOSCOPY POLYPECTOMY SNARE/COLD BIOPSY performed by Adella Nissen, MD at [facility] ENDOSCOPY    COLONOSCOPY  05/26/2020    COLONOSCOPY WITH BIOPSY performed by Adella Nissen, MD at [facility] ENDOSCOPY    COLONOSCOPY N/A 11/08/2022    COLONOSCOPY WITH BIOPSY, COLD SNARE/HOT SNARE performed by Adella Nissen, MD at [facility] ENDOSCOPY    COLONOSCOPY N/A 12/16/2022    COLONOSCOPY BIOPSY performed by Adella Nissen, MD at [facility] ENDOSCOPY    CT BONE MARROW BIOPSY  01/03/2021    CT BONE MARROW BIOPSY 01/03/2021 [facility] CT SCAN    HYSTERECTOMY, TOTAL ABDOMINAL (CERVIX REMOVED)      OVARY REMOVAL      PORT SURGERY Left 12/11/2022    LEFT SUBCLAVIAN PORT PLACEMENT performed by Kizzie Furnish, MD at [facility] OR    TOTAL KNEE ARTHROPLASTY Bilateral 2009    TOTAL SHOULDER ARTHROPLASTY W/ DISTAL CLAVICLE EXCISION Right 2010    UPPER GASTROINTESTINAL ENDOSCOPY N/A 05/26/2020    EGD BIOPSY performed by Adella Nissen, MD at [facility] ENDOSCOPY    XR MIDLINE EQUAL OR GREATER THAN 5 YEARS  07/28/2018    XR MIDLINE EQUAL OR GREATER THAN 5 YEARS 07/28/2018       Current Medications:    Current Outpatient Medications    Medication Sig Dispense Refill    acyclovir (ZOVIRAX) 400 MG tablet Take 1 tablet by mouth 2 times daily      dexAMETHasone (DECADRON) 4 MG tablet Take 1 tablet by mouth daily      mirtazapine (REMERON) 15 MG tablet Take 1 tablet by mouth nightly (Patient not taking: Reported on 12/16/2022)      lenalidomide (REVLIMID) 25 MG chemo capsule Take 1 capsule by mouth daily      predniSONE (DELTASONE) 50 MG tablet Take one tab 13 hours, 6 hours and 1 hour prior to CT Scan 3 tablet 0    furosemide (LASIX) 20 MG tablet Take 2 tablets by mouth daily Take 80 mg if wt goes up 3-5lbs 30 tablet 1    gabapentin (NEURONTIN) 300 MG capsule TAKE ONE CAPSULE BY MOUTH 3 TIMES DAILY 90 capsule 0    carvedilol (COREG) 25 MG tablet TAKE ONE TABLET BY MOUTH TWICE DAILY (Patient taking differently: Take 0.5 tablets by mouth 2 times daily (with meals)) 180 tablet 3    valsartan (DIOVAN) 320 MG tablet Take 1 tablet by mouth daily 90 tablet 3    empagliflozin (JARDIANCE) 10 MG tablet Take 1 tablet by mouth daily 90 tablet 3    atorvastatin (LIPITOR) 40 MG tablet Take 1 tablet by mouth daily 90 tablet 1    Cholecalciferol (VITAMIN D3) 25 MCG TABS TAKE TWO TABLETS BY MOUTH DAILY 60 tablet 2    albuterol sulfate HFA (VENTOLIN HFA) 108 (90 Base) MCG/ACT inhaler Inhale 2 puffs into the lungs 4 times daily as needed for Wheezing 18 g 0    tiZANidine (ZANAFLEX) 2 MG tablet Take 1 tablet  by mouth nightly as needed (muscle spasms) 30 tablet 1    ondansetron (ZOFRAN-ODT) 4 MG disintegrating tablet DISSOLVE 1 TABLET ON THE TONGUE EVERY 8 HOURS AS NEEDED FOR NAUSEA 21 tablet 0    omeprazole (PRILOSEC) 20 MG delayed release capsule Take 1 capsule by mouth every morning (before breakfast) 90 capsule 1    acetaminophen (TYLENOL) 500 MG tablet Take 1 tablet by mouth every 6 hours as needed for Pain (Patient not taking: Reported on 12/11/2022)       No current facility-administered medications for this visit.       Allergies:  Aspirin, Ibuprofen, Mushroom  extract complex, Shellfish-derived products, Sulfa antibiotics, Sulfasalazine, Vancomycin, Amoxicillin-pot clavulanate, Coconut flavor, Egg-derived products, Iodinated contrast media, Shellfish allergy, Acetaminophen, Amoxicillin, Coconut oil, Iodides, Povidone-iodine, Ciprofloxacin, Coconut (cocos nucifera), Codeine, Fish-derived products, Iodine, Metronidazole, Other, Povidone iodine, and Sulfacetamide    Social History:   Social History     Socioeconomic History    Marital status: Single     Spouse name: Not on file    Number of children: Not on file    Years of education: Not on file    Highest education level: Not on file   Occupational History    Not on file   Tobacco Use    Smoking status: Never    Smokeless tobacco: Never   Vaping Use    Vaping Use: Never used   Substance and Sexual Activity    Alcohol use: Never    Drug use: Never    Sexual activity: Not on file   Other Topics Concern    Not on file   Social History Narrative    Not on file     Social Determinants of Health     Financial Resource Strain: Not on file   Food Insecurity: Not on file   Transportation Needs: Not on file   Physical Activity: Not on file   Stress: Not on file   Social Connections: Not on file   Intimate Partner Violence: Not on file   Housing Stability: Not on file       Family History:   Family History   Problem Relation Age of Onset    Cancer Paternal Grandmother         unk     Breast Cancer Maternal Aunt        Review of Systems:    Constitutional: No fever, no chills, no lethargy, no weakness.  HEENT:  No headache, otalgia, itchy eyes, nasal discharge or sore throat.  Cardiac:  No chest pain, dyspnea, orthopnea or PND.  Chest:  No cough, phlegm or wheezing.  Abdomen:  No abdominal pain, nausea or vomiting.  Neuro:  No focal weakness, abnormal movements orseizure like activity.  Skin:   No rashes, no itching.  GU:   No hematuria, no pyuria, no dysuria, no flank pain.  Extremities:  No swelling or joint  pains.      Objective:  BP 95/71   Pulse 78   Resp 12   Wt 109.8 kg (242 lb)   BMI 42.87 kg/m     Physical Exam:  General appearance:Awake, alert, in no acute distress  Skin: warm and dry, no rash or erythema  Eyes: conjunctivae normal and sclera anicteric  ENT: :no thrush no pharyngeal congestion    Neck: without any JVD. No carotid bruits or thyromegaly. No  Lymphadenopathty:  Pulmonary: lungs are clear and without any wheezing or rhonchi   Cardiovascular:Normal S1 & S2, No  S3 or  S4, No  Pericardial rub , No Murmur Abdomen: soft nontender, bowel sounds present, no organomegaly,  No ascites  Extremities: no cyanosis, clubbing or edema    Labs:    CBC:   Lab Results   Component Value Date/Time    WBC 4.8 02/04/2023 02:16 PM    RBC 5.02 02/04/2023 02:16 PM    HGB 14.6 02/04/2023 02:16 PM    HCT 45.0 02/04/2023 02:16 PM    MCV 89.6 02/04/2023 02:16 PM    RDW 15.1 02/04/2023 02:16 PM    PLT 82 02/04/2023 02:16 PM    MPV 10.5 02/04/2023 02:16 PM      BMP:   Lab Results   Component Value Date/Time    NA 142 07/12/2022 04:28 PM    NA 145 11/14/2021 11:59 AM    NA 141 04/23/2021 10:46 PM    K 4.1 07/12/2022 04:28 PM    K 3.4 11/14/2021 11:59 AM    K 4.2 04/23/2021 10:46 PM    K 4.9 06/12/2020 11:47 AM    K 4.0 06/08/2020 04:19 PM    CL 105 07/12/2022 04:28 PM    CL 106 11/14/2021 11:59 AM    CL 105 04/23/2021 10:46 PM    CO2 25 07/12/2022 04:28 PM    CO2 24 11/14/2021 11:59 AM    CO2 26 04/23/2021 10:46 PM    BUN 8 07/12/2022 04:28 PM    BUN 8 11/14/2021 11:59 AM    BUN 9 04/23/2021 10:46 PM    CREATININE 1.0 12/05/2022 11:08 AM    CREATININE 1.0 07/12/2022 04:28 PM    CREATININE 1.1 11/14/2021 11:59 AM    CREATININE 1.0 04/23/2021 10:46 PM    GLUCOSE 95 07/12/2022 04:28 PM    GLUCOSE 96 11/14/2021 11:59 AM    GLUCOSE 125 04/23/2021 10:46 PM    CALCIUM 9.8 07/12/2022 04:28 PM    CALCIUM 9.0 11/14/2021 11:59 AM    CALCIUM 9.3 04/23/2021 10:46 PM      BNP:No results found for: "BNP"  PHOSPHORUS:    Lab Results    Component Value Date/Time    PHOS 3.2 11/14/2021 11:59 AM    PHOS 3.3 04/18/2020 02:00 PM    PHOS 2.9 03/07/2020 03:37 PM     MAGNESIUM: No results found for: "MG"  ALBUMIN:   No results found for: "LABALBU"    IRON:  No results found for: "IRON"  IRON SATURATION:  No components found for: "LABIRON"  TIBC:  No results found for: "TIBC"  FERRITIN:  No results found for: "FERRITIN"  ANA: No results found for: "ANA"    SPEP:   No results found for: "ALBCAL", "ALBPCT", "LABALPH", "A1PCT", "A2PCT", "LABBETA", "BETAPCT", "GAMGLOB", "GGPCT", "PATH"    UPEP: No results found for: "TPU"   HEPBSAG:No results found for: "HEPBSAG"  HEPCAB:No results found for: "HEPCAB"  C3: No results found for: "C3"  C4: No results found for: "C4"  MPO ANCA: No results found for: "MPO" .  PR3 ANCA:  No results found for: "PR3"  PTH: No results found for: "PTH"    Urine Creatinine:    No results found for: "LABCREA"    Urine Eosinophils: No results found for: "UREO"  Urine Protein:  No results found for: "TPU"  Urinalysis:  U/A:   Lab Results   Component Value Date/Time    NITRU Negative 07/12/2022 05:05 PM    COLORU Yellow 07/12/2022 05:05 PM    PHUR 6.0 07/12/2022 05:05 PM  WBCUA 3-5 07/12/2022 05:05 PM    RBCUA 3-4 07/12/2022 05:05 PM    MUCUS 2+ 07/12/2022 05:05 PM    BACTERIA 1+ 04/23/2021 11:36 PM    CLARITYU Clear 07/12/2022 05:05 PM    LEUKOCYTESUR Negative 07/12/2022 05:05 PM    UROBILINOGEN 0.2 07/12/2022 05:05 PM    BILIRUBINUR Negative 07/12/2022 05:05 PM    BLOODU Negative 07/12/2022 05:05 PM    GLUCOSEU >=1000 07/12/2022 05:05 PM    KETUA Negative 07/12/2022 05:05 PM    AMORPHOUS 3+ 07/12/2022 05:05 PM     Wt Readings from Last 3 Encounters:   03/11/23 109.8 kg (242 lb)   02/04/23 113.4 kg (250 lb)   12/16/22 109.8 kg (242 lb)       Radiology:  Reviewed as available.    Assessment        Plan:    AKI on CKD 3   CHF   HTN   Neuropathy   Vit D def   IBS  MM      Plan  - GFR stable with mild fluctuation   - Pt had bone marrow  biopsy and was diagnosed with MM - on chemo per [facility]   - AKI sec to vol changes   - EF nl   - BNP 202 --> 195 --> 66--->16.1  - On ARB & BB  - hold jardiance as BP low     - off CCB as that cause edema  - dec Neurontin   - GFR - 20--->60--->54 -->53 -->56--->49    Dry wt 240 lbs at home       - Avoid any NSAIDS       Thank you for the consultation.  Please do not hesitate to call with questions.

## 2023-04-02 NOTE — Telephone Encounter (Signed)
R/s for 9/4 @ 3:45. Patient notified

## 2023-04-02 NOTE — Telephone Encounter (Signed)
Patient called on our overnight answering service on 04/01/23 @ 5:36 AM to cancel her appointment with Dr. Harrison Mons to to having one of her Chemo session.  Would like a call back to reschedule appointment and can be reached at [phone]

## 2023-04-04 ENCOUNTER — Encounter: Payer: MEDICARE | Attending: Cardiovascular Disease | Primary: Family Medicine

## 2023-04-23 ENCOUNTER — Ambulatory Visit
Admit: 2023-04-23 | Discharge: 2023-04-23 | Payer: MEDICARE | Attending: Cardiovascular Disease | Primary: Family Medicine

## 2023-04-23 VITALS — BP 120/76 | HR 97 | Wt 238.6 lb

## 2023-04-23 DIAGNOSIS — I5042 Chronic combined systolic (congestive) and diastolic (congestive) heart failure: Secondary | ICD-10-CM

## 2023-04-23 NOTE — Progress Notes (Signed)
 Cc: HTN, HFrEF     HPI:     Gabrielle Wells is a 72 y.o. 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        Echo 09/2019: nl LV, EF 60-65%, normal diastolic, normal valves and RV.     ECG 10/11/19: sinus brady 55, nonspecific changes, low voltage.     Renal duplex 09/2019: no RAS, indeterminate hypoechoic lesion R kidney.     Urine metanephrines w/ high norepi, normal cortisol, renin and aldosterone.      FLP 07/2019: TC 224, HDL 62, LDL 137, TG 124 off statin.     Lexi 10/2020: normal.    Echo 11/2021 ([facility]): Normal LV, LVEF 45-50%, grade 1 diastolic dysfunction, RV dysfunction, poor quality of bubble study therefore cannot exclude PFO.     Patient is here for follow-up.  She reports not feeling well, decreased appetite.      Histories     Past Medical History:   has a past medical history of Adenocarcinoma of colon (HCC), Anesthesia, Arthritis, Asthma, CHF (congestive heart failure) (HCC), CKD (chronic kidney disease), Epidural abscess, GERD (gastroesophageal reflux disease), HTN (hypertension), Hyperlipidemia, IBS (irritable bowel syndrome), Incontinence of urine, Lumbar radiculopathy, Morbid obesity with BMI of 45.0-49.9, adult (HCC), Multiple allergies, Multiple myeloma (HCC), Muscle fasciculation, Neuropathic pain, Prolonged emergence from general anesthesia, SBO (small bowel obstruction) (HCC), Spinal fracture of T8 vertebra (HCC), Surgical site infection, Urinary incontinence, and Uses walker.    Surgical History:   has a past surgical history that includes Appendectomy; Breast lumpectomy (Bilateral); Hysterectomy, total abdominal; Cholecystectomy; Total knee arthroplasty (Bilateral, 2009); Cardiac catheterization (2012); Total shoulder arthroplasty w/ distal clavicle excision (Right, 2010); Abdominal hernia repair (2010); Colonoscopy; Upper  gastrointestinal endoscopy (N/A, 05/26/2020); Colonoscopy (05/26/2020); Colonoscopy (05/26/2020); CT BIOPSY BONE MARROW (01/03/2021); Abdomen surgery (N/A, 05/16/2021); XR MIDLINE EQUAL OR GREATER THAN 5 YEARS (07/28/2018); Ovary removal; Colonoscopy (N/A, 11/08/2022); Port Surgery (Left, 12/11/2022); and Colonoscopy (N/A, 12/16/2022).     Social History:   reports that she has never smoked. She has never used smokeless tobacco. She reports that she does not drink alcohol and does not use drugs.     Family History:  No evidence for sudden cardiac death or premature CAD      Medications:     Home medications were reviewed and are listed below    Prior to Admission medications    Medication Sig Start Date End Date Taking? Authorizing Provider   apixaban  (ELIQUIS ) 5 MG TABS tablet 2 tablets 12/28/22  Yes [provider]   acyclovir (ZOVIRAX) 400 MG tablet Take 1 tablet by mouth 2 times daily   Yes [provider]   dexAMETHasone  (DECADRON ) 4 MG tablet Take 1 tablet by mouth daily   Yes [provider]   lenalidomide (REVLIMID) 25 MG chemo capsule Take 1 capsule by mouth daily   Yes [provider]   predniSONE  (DELTASONE ) 50 MG tablet Take one tab 13 hours, 6 hours and 1 hour prior to CT Scan 11/28/22  Yes Barrat, Darleene BIRCH, MD   gabapentin  (NEURONTIN ) 300 MG capsule TAKE ONE CAPSULE BY MOUTH 3 TIMES DAILY 05/09/22 04/23/23 Yes Kour, Jasleen, MD   carvedilol  (COREG ) 25 MG tablet TAKE ONE TABLET BY MOUTH TWICE DAILY  Patient taking differently: Take 0.5 tablets by mouth 2 times daily (with meals) 01/09/22  Yes Feliciano Zachary BRAVO,  MD   valsartan  (DIOVAN ) 320 MG tablet Take 1 tablet by mouth daily 12/19/21 04/23/23 Yes Clytee Heinrich E, MD   atorvastatin  (LIPITOR) 40 MG tablet Take 1 tablet by mouth daily 12/18/21  Yes Coni, Saadia H, DO   Cholecalciferol  (VITAMIN D3) 25 MCG TABS TAKE TWO TABLETS BY MOUTH DAILY 12/18/21  Yes Qureshi, Saadia H, DO   albuterol  sulfate HFA (VENTOLIN  HFA) 108 (90 Base) MCG/ACT  inhaler Inhale 2 puffs into the lungs 4 times daily as needed for Wheezing 07/16/21  Yes Erle Pac, MD   tiZANidine  (ZANAFLEX ) 2 MG tablet Take 1 tablet by mouth nightly as needed (muscle spasms) 07/16/21  Yes Erle Pac, MD   ondansetron  (ZOFRAN -ODT) 4 MG disintegrating tablet DISSOLVE 1 TABLET ON THE TONGUE EVERY 8 HOURS AS NEEDED FOR NAUSEA 07/16/21  Yes Erle Pac, MD   mirtazapine (REMERON) 15 MG tablet Take 1 tablet by mouth nightly  Patient not taking: Reported on 12/16/2022    [provider]   furosemide  (LASIX ) 20 MG tablet Take 2 tablets by mouth daily Take 80 mg if wt goes up 3-5lbs 06/17/22 12/16/22  Tindni, Arshdeep, MD   omeprazole  (PRILOSEC) 20 MG delayed release capsule Take 1 capsule by mouth every morning (before breakfast)  Patient not taking: Reported on 04/23/2023 11/28/20   Nguyen, Huong L, MD   acetaminophen  (TYLENOL ) 500 MG tablet Take 1 tablet by mouth every 6 hours as needed for Pain  Patient not taking: Reported on 12/11/2022    [provider]          Allergy :     Aspirin , Ibuprofen, Mushroom extract complex, Shellfish-derived products, Sulfa antibiotics, Sulfasalazine, Vancomycin, Amoxicillin-pot clavulanate, Coconut flavor, Egg-derived products, Iodinated contrast media, Shellfish allergy , Acetaminophen , Amoxicillin, Coconut oil, Iodides, Povidone-iodine, Ciprofloxacin , Coconut (cocos nucifera), Codeine, Fish-derived products, Iodine, Metronidazole , Other, Povidone iodine, and Sulfacetamide       Review of Systems:     All 12 point review of symptoms completed. Pertinent positives identified in the HPI, all other review of symptoms negative as below.    CONSTITUTIONAL: + fatigue  SKIN: No rash or pruritis.  EYES: No visual changes or diplopia. No scleral icterus.  ENT: No Headaches, hearing loss or vertigo. No mouth sores or sore throat.  CARDIOVASCULAR: No chest pain/chest pressure/chest discomfort. No palpitations. No edema.   RESPIRATORY: No dyspnea. No  cough or wheezing, no sputum production.  GASTROINTESTINAL: No N/V/D. No abdominal pain, appetite loss, blood in stools.  GENITOURINARY: No dysuria, trouble voiding, or hematuria.  MUSCULOSKELETAL:  No gait disturbance, weakness or joint complaints.  NEUROLOGICAL: No headache, diplopia, change in muscle strength, numbness or tingling. No change in gait, balance, coordination, mood, affect, memory, mentation, behavior.  PSHYCH: No anxiety, loss of interest, change in sexual behavior, feelings of self-harm, or confusion.  ENDOCRINE: No excessive thirst, fluid intake, or urination. No tremor.  HEMATOLOGIC: No abnormal bruising or bleeding.  ALLERGY : No nasal congestion or hives.      Physical Examination:     Vitals:    04/23/23 1540   BP: 120/76   Pulse: 97   Weight: 108.2 kg (238 lb 9.6 oz)       Wt Readings from Last 3 Encounters:   04/23/23 108.2 kg (238 lb 9.6 oz)   03/11/23 109.8 kg (242 lb)   02/04/23 113.4 kg (250 lb)         General Appearance:  Alert, cooperative, no distress, appears stated age Appropriate weight   Head:  Normocephalic, without  obvious abnormality, atraumatic   Eyes:  PERRL, conjunctiva/corneas clear EOM intact  Ears normal   Throat no lesions       Nose: Nares normal, no drainage or sinus tenderness   Throat: Lips, mucosa, and tongue normal   Neck: Supple, symmetrical, trachea midline, no adenopathy, thyroid: not enlarged, symmetric, no tenderness/mass/nodules, no carotid bruit       Lungs:   Clear to auscultation bilaterally, respirations unlabored   Chest Wall:  No tenderness or deformity   Heart:  Regular rhythm, rate is controlled, S1, S2 normal, there is no murmur, there is no rub or gallop, cannot assess jvd, no bilateral lower extremity edema   Abdomen:   Soft, non-tender, bowel sounds active all four quadrants,  no masses, no organomegaly       Extremities: Extremities normal, atraumatic, no cyanosis   Pulses: 2+ and symmetric   Skin: Skin color, texture, turgor normal, no rashes or  lesions   Pysch: Normal mood and affect   Neurologic: Normal gross motor and sensory exam.  Cranial nerves intact        Labs:     Lab Results   Component Value Date    WBC 4.8 02/04/2023    HGB 14.6 02/04/2023    HCT 45.0 02/04/2023    MCV 89.6 02/04/2023    PLT 82 (L) 02/04/2023     Lab Results   Component Value Date    NA 142 07/12/2022    K 4.1 07/12/2022    CL 105 07/12/2022    CO2 25 07/12/2022    BUN 8 07/12/2022    CREATININE 1.0 12/05/2022    GLUCOSE 95 07/12/2022    CALCIUM  9.8 07/12/2022    BILITOT <0.2 04/23/2021    ALKPHOS 78 04/23/2021    AST 16 04/23/2021    ALT 7 (L) 04/23/2021    LABGLOM 59 (A) 12/05/2022    GFRAA >60 04/23/2021    AGRATIO 1.0 (L) 05/08/2020    GLOB 4.2 05/08/2020         Lab Results   Component Value Date    CHOL 224 (H) 08/04/2019     Lab Results   Component Value Date    TRIG 124 08/04/2019     Lab Results   Component Value Date    HDL 62 (H) 08/04/2019     No components found for: [facility]    Lab Results   Component Value Date    VLDL 25 08/04/2019     No results found for: [facility]    Lab Results   Component Value Date    INR 1.02 02/04/2023    INR 0.97 10/01/2022    INR 1.03 06/04/2022    PROTIME 13.6 02/04/2023    PROTIME 12.9 10/01/2022    PROTIME 13.5 06/04/2022       The ASCVD Risk score (Arnett DK, et al., 2019) failed to calculate for the following reasons:    Cannot find a previous HDL lab    Cannot find a previous total cholesterol lab      Assessment / Plan:      Diagnosis Orders   1. Chronic combined systolic and diastolic CHF, NYHA class 1 (HCC)                 1. Severe HTN:  BP is now controlled.     -Cw coreg  25 bid, valsartan  320 mg daily  -Low salt diet, she takes Lasix  per Dr Rebeca.  2. Chronic HFrEF:   Patient appears euvolemic.  Systolic heart failure appears to be nonischemic in etiology in view of normal stress test.     -BP control as above.  -Will stop Jardiance  due to decreased p.o. intake due to chemo.  -Low salt diet  -On  lasix  prn     3. Exertional dypnea and fatigue:  Lexi nuc stress test excluded ischemia.  Symptoms have resolved.     -Conservative approach     4. HLP:   Patient was started on lipitor by PCP.    5.  Multiple myeloma.  Patient is currently on chemotherapy (Revlimid) per [facility] .      Return in about 6 months (around 10/21/2023).    I have spent 42 minutes of reviewing records and face to face time with the patient with more than 50% spent counseling and coordinating care.       I have personally reviewed the reports and images of labs, radiological studies, cardiac studies including ECG's and telemetry, current and old medical records. The note was completed using EMR and Dragon dictation system. Every effort was made to ensure accuracy; however, inadvertent computerized transcription errors may be present.    All questions and concerns were addressed to the patient/family. Alternatives to my treatment were discussed.     I would like to thank you for providing me the opportunity to participate in the care of your patient. If you have any questions, please do not hesitate to contact me.     Zachary CHARLENA Maffucci, MD, [facility], F[facility]  [facility]  [address]  [address]  [place] [address]  Main Office Phone: [phone]  Fax: [phone]

## 2023-05-04 ENCOUNTER — Encounter

## 2023-05-05 ENCOUNTER — Encounter

## 2023-05-09 ENCOUNTER — Inpatient Hospital Stay: Admit: 2023-05-09 | Payer: MEDICARE | Primary: Family Medicine

## 2023-05-09 DIAGNOSIS — C9000 Multiple myeloma not having achieved remission: Secondary | ICD-10-CM

## 2023-05-19 ENCOUNTER — Encounter: Payer: Medicare (Managed Care) | Attending: Otolaryngology | Primary: Family Medicine

## 2023-05-26 ENCOUNTER — Ambulatory Visit: Admit: 2023-05-26 | Discharge: 2023-05-26 | Payer: MEDICARE | Attending: Otolaryngology | Primary: Family Medicine

## 2023-05-26 DIAGNOSIS — R1319 Other dysphagia: Secondary | ICD-10-CM

## 2023-05-26 NOTE — Progress Notes (Signed)
Subjective:      Patient ID: Gabrielle Wells is a 72 y.o. female.    HPI  Chief Complaint   Patient presents with    swallowing problem       History of Present Illness  Head/Neck    Gabrielle Wells is a(n) 72 y.o. female who presents with coughing and choking while eating. Last several months. Saw GI. Had esophagram with severe esophageal dysphagia. No SLP evaluation. Has multiple myeloma. CT neck without masses r lesions. Not losing weight.   Pain: No  Otalgia: No  Odynophagia: No  Dysphagia: Yes  Voice Changes: No  Lumps in neck: No  Modifying Factors: Non smoker  Associates Signs and Symptoms: none    Patient Active Problem List   Diagnosis    Abscess in epidural space of lumbar spine    Resistant hypertension    GERD (gastroesophageal reflux disease)    Back wound    AKI (acute kidney injury) (HCC)    CKD (chronic kidney disease), stage II    Disorder involving thrombocytopenia (HCC)    Vitamin D deficiency    Hyperlipidemia    Morbid obesity    BPPV (benign paroxysmal positional vertigo)    Fall at home, initial encounter    Monoclonal gammopathy of unknown significance (MGUS)    Suspected condition    Acute blood loss anemia    Backache    Severe hypertension    Chronic pain disorder    Community acquired pneumonia    CSF leak    Depression    Diarrhea    Discitis of lumbosacral region    Elevated lipase    Fever and chills    Fracture of neck of left humerus, closed, initial encounter    Fracture of T10 vertebra (HCC)    Generalized anxiety disorder    Glenohumeral arthritis    Proteus infection    Humerus fracture    Hypotensive episode    Intertrigo    Obstipation    Klebsiella infection    Labile blood pressure    Leukopenia    Left knee DJD    Lumbar pars defect    Narcotic-induced respiratory depression    Neuropathic pain    Obesity hypoventilation syndrome    Other specified anemias    Physical deconditioning    Pill dysphagia    Postoperative pain    Protein-calorie malnutrition, severe (HCC)    Pruritic  condition    Zoster    Wound infection after surgery    CHF (congestive heart failure) (HCC)    Multiple myeloma (HCC)     Past Surgical History:   Procedure Laterality Date    ABDOMEN SURGERY N/A 05/16/2021    ABDOMINAL FAT PAD EXCISIONAL BIOPSY performed by Lucky Cowboy, MD at [facility] OR    ABDOMINAL HERNIA REPAIR  2010    APPENDECTOMY      BREAST LUMPECTOMY Bilateral     benign lesions    CARDIAC CATHETERIZATION  2012    CHOLECYSTECTOMY      COLONOSCOPY      COLONOSCOPY  05/26/2020    COLONOSCOPY POLYPECTOMY SNARE/COLD BIOPSY performed by Adella Nissen, MD at [facility] ENDOSCOPY    COLONOSCOPY  05/26/2020    COLONOSCOPY WITH BIOPSY performed by Adella Nissen, MD at [facility] ENDOSCOPY    COLONOSCOPY N/A 11/08/2022    COLONOSCOPY WITH BIOPSY, COLD SNARE/HOT SNARE performed by Adella Nissen, MD at [facility] ENDOSCOPY    COLONOSCOPY N/A 12/16/2022  COLONOSCOPY BIOPSY performed by Adella Nissen, MD at [facility] ENDOSCOPY    CT BONE MARROW BIOPSY  01/03/2021    CT BONE MARROW BIOPSY 01/03/2021 [facility] CT SCAN    HYSTERECTOMY, TOTAL ABDOMINAL (CERVIX REMOVED)      OVARY REMOVAL      PORT SURGERY Left 12/11/2022    LEFT SUBCLAVIAN PORT PLACEMENT performed by Kizzie Furnish, MD at [facility] OR    TOTAL KNEE ARTHROPLASTY Bilateral 2009    TOTAL SHOULDER ARTHROPLASTY W/ DISTAL CLAVICLE EXCISION Right 2010    UPPER GASTROINTESTINAL ENDOSCOPY N/A 05/26/2020    EGD BIOPSY performed by Adella Nissen, MD at [facility] ENDOSCOPY    XR MIDLINE EQUAL OR GREATER THAN 5 YEARS  07/28/2018    XR MIDLINE EQUAL OR GREATER THAN 5 YEARS 07/28/2018     Family History   Problem Relation Age of Onset    Cancer Paternal Grandmother         unk     Breast Cancer Maternal Aunt      Social History     Socioeconomic History    Marital status: Single     Spouse name: Not on file    Number of children: Not on file    Years of education: Not on file    Highest education level: Not on file   Occupational History    Not on file   Tobacco Use    Smoking status: Never     Smokeless tobacco: Never   Vaping Use    Vaping status: Never Used   Substance and Sexual Activity    Alcohol use: Never    Drug use: Never    Sexual activity: Not on file   Other Topics Concern    Not on file   Social History Narrative    Not on file     Social Determinants of Health     Financial Resource Strain: Not on file   Food Insecurity: Unknown (09/08/2022)    Received from [facility] and [employer], [employer] and [employer]     Worried about running out of food: Not on [place] Bought: Not on file   Transportation Needs: Unknown (09/08/2022)    Received from [employer] and [employer], [employer] traffic [employer] and [employer]     Worried about transportation: Not on file   Physical Activity: Not on file   Stress: Not on file   Social Connections: Not on file   Intimate Partner Violence: Unknown (09/08/2022)    Received from [employer] and [employer], [employer] traffic [employer] and [employer]     Feel physically or emotionally unsafe where currently live: Not on file     Harm by anyone: Not on file     Emotionally Harmed: Not on file   Housing Stability: Unknown (09/08/2022)    Received from [employer] and [employer], [employer] traffic [employer] and [employer]    Housing/Utilities     Worried about losing home: Not on file     Stayed outside house: Not on file     Unable to get utilities: Not on file       DRUG/FOOD ALLERGIES: Aspirin, Ibuprofen, Mushroom extract complex, Shellfish-derived products, Sulfa antibiotics, Sulfasalazine, Vancomycin, Amoxicillin-pot clavulanate, Coconut flavor, Egg-derived products, Iodinated contrast media, Shellfish allergy, Acetaminophen, Amoxicillin, Coconut oil, Iodides, Povidone-iodine, Ciprofloxacin, Coconut (cocos nucifera), Codeine, Fish-derived products, Iodine, Metronidazole, Other,  Povidone iodine, and Sulfacetamide    CURRENT  MEDICATIONS  Prior to Admission medications    Medication Sig Start Date End Date Taking? Authorizing Provider   apixaban (ELIQUIS) 5 MG TABS tablet 2 tablets 12/28/22   [provider]   acyclovir (ZOVIRAX) 400 MG tablet Take 1 tablet by mouth 2 times daily    [provider]   dexAMETHasone (DECADRON) 4 MG tablet Take 1 tablet by mouth daily    [provider]   mirtazapine (REMERON) 15 MG tablet Take 1 tablet by mouth nightly  Patient not taking: Reported on 12/16/2022    [provider]   lenalidomide (REVLIMID) 25 MG chemo capsule Take 1 capsule by mouth daily    [provider]   predniSONE (DELTASONE) 50 MG tablet Take one tab 13 hours, 6 hours and 1 hour prior to CT Scan 11/28/22   Kizzie Furnish, MD   furosemide (LASIX) 20 MG tablet Take 2 tablets by mouth daily Take 80 mg if wt goes up 3-5lbs 06/17/22 12/16/22  Tindni, Arshdeep, MD   gabapentin (NEURONTIN) 300 MG capsule TAKE ONE CAPSULE BY MOUTH 3 TIMES DAILY 05/09/22 04/23/23  Barbee Cough, MD   carvedilol (COREG) 25 MG tablet TAKE ONE TABLET BY MOUTH TWICE DAILY  Patient taking differently: Take 0.5 tablets by mouth 2 times daily (with meals) 01/09/22   Laurette Schimke, MD   valsartan (DIOVAN) 320 MG tablet Take 1 tablet by mouth daily 12/19/21 04/23/23  Laurette Schimke, MD   atorvastatin (LIPITOR) 40 MG tablet Take 1 tablet by mouth daily 12/18/21   Cherlyn Roberts H, DO   Cholecalciferol (VITAMIN D3) 25 MCG TABS TAKE TWO TABLETS BY MOUTH DAILY 12/18/21   Cherlyn Roberts H, DO   albuterol sulfate HFA (VENTOLIN HFA) 108 (90 Base) MCG/ACT inhaler Inhale 2 puffs into the lungs 4 times daily as needed for Wheezing 07/16/21   Roxy Manns, MD   tiZANidine (ZANAFLEX) 2 MG tablet Take 1 tablet by mouth nightly as needed (muscle spasms) 07/16/21   Roxy Manns, MD   ondansetron (ZOFRAN-ODT) 4 MG disintegrating tablet DISSOLVE 1 TABLET ON THE TONGUE EVERY 8 HOURS AS NEEDED FOR NAUSEA 07/16/21   Roxy Manns, MD    omeprazole (PRILOSEC) 20 MG delayed release capsule Take 1 capsule by mouth every morning (before breakfast)  Patient not taking: Reported on 04/23/2023 11/28/20   Margarito Liner, MD   acetaminophen (TYLENOL) 500 MG tablet Take 1 tablet by mouth every 6 hours as needed for Pain  Patient not taking: Reported on 12/11/2022    [provider]       Lab Studies:  Lab Results   Component Value Date    WBC 4.8 02/04/2023    HGB 14.6 02/04/2023    HCT 45.0 02/04/2023    MCV 89.6 02/04/2023    PLT 82 (L) 02/04/2023     Lab Results   Component Value Date    GLUCOSE 95 07/12/2022    BUN 8 07/12/2022    CREATININE 1.0 12/05/2022    K 4.1 07/12/2022    NA 142 07/12/2022    CL 105 07/12/2022    CALCIUM 9.8 07/12/2022     No results found for: "MG"  Lab Results   Component Value Date    PHOS 3.2 11/14/2021     Lab Results   Component Value Date    ALKPHOS 78 04/23/2021    ALT 7 (L) 04/23/2021    AST 16 04/23/2021  BILITOT <0.2 04/23/2021    ALBUMIN 4.2 11/14/2021        Review of Systems   Constitutional:  Negative for activity change, appetite change, chills, fatigue and fever.   HENT:  Positive for trouble swallowing. Negative for congestion, ear discharge, ear pain, facial swelling, hearing loss, nosebleeds, postnasal drip, rhinorrhea, sinus pressure, sinus pain, sneezing, sore throat, tinnitus and voice change.    Eyes:  Negative for pain, redness, itching and visual disturbance.   Respiratory:  Negative for cough, choking and shortness of breath.    Gastrointestinal:  Negative for diarrhea and nausea.   Endocrine: Negative for cold intolerance and heat intolerance.       Objective:   Physical Exam  Constitutional:       General: She is not in acute distress.     Appearance: She is well-developed.   HENT:      Head: Not macrocephalic and not microcephalic. No abrasion or contusion.      Mouth/Throat:      Lips: No lesions.      Mouth: No oral lesions.      Dentition: Normal dentition.      Tongue: No lesions.       Palate: No mass.      Pharynx: No oropharyngeal exudate, posterior oropharyngeal erythema or uvula swelling.      Tonsils: No tonsillar abscesses.   Neck:      Thyroid: No thyroid mass or thyromegaly.      Trachea: No tracheal tenderness or tracheal deviation.   Cardiovascular:      Rate and Rhythm: Normal rate and regular rhythm.   Pulmonary:      Breath sounds: No stridor.   Musculoskeletal:      Cervical back: Normal range of motion and neck supple. No edema or erythema. No muscular tenderness.   Lymphadenopathy:      Head:      Right side of head: No submental, submandibular, tonsillar, preauricular, posterior auricular or occipital adenopathy.      Left side of head: No submental, submandibular, tonsillar, preauricular or occipital adenopathy.      Cervical: No cervical adenopathy.      Right cervical: No superficial, deep or posterior cervical adenopathy.     Left cervical: No superficial, deep or posterior cervical adenopathy.   Skin:     General: Skin is warm and dry.      Findings: No lesion.   Neurological:      Mental Status: She is alert and oriented to person, place, and time.   Psychiatric:         Mood and Affect: Mood is not anxious or depressed.       CT SOFT TISSUE NECK W CONTRAST [IMG192]  Status: Final result     PACS Images     Show images for CT SOFT TISSUE NECK W CONTRAST  Related Results       CT CHEST W CONTRAST Final result 12/28/2022          Narrative   EXAM: CT NECK and chest    INDICATION: Restaging colon cancer. Monoclonal gammopathy. Multiple myeloma.    COMPARISON: CT of the chest, abdomen, and pelvis 12/05/2022. PET/CT 10/03/2022.    TECHNIQUE: Axial CT imaging obtained through the neck and chest.. Axial images  and multiplanar reformatted images were reviewed.   Individualized dose  optimization technique was used in order to meet ALARA standards for radiation  dose reduction.  In addition to vendor specific dose reduction  algorithms, the ...   Impression     NECK:    1. No  lymphadenopathy in the neck. No acute abnormality identified.    CHEST:    1. Multiple pulmonary emboli identified without evidence of significant right  heart strain.  2. No lymphadenopathy or acute cardiopulmonary abnormality otherwise. ...            FL ESOPHAGRAM  Order: [id number]  Status: Final result       Visible to patient: Yes (not seen)       Next appt: 06/12/2023 at 01:45 PM in Nephrology Allegra Grana, MD)       Dx: Multiple myeloma, remission status un...    0 Result Notes  Details    Reading Physician Reading Date Result Priority   Tedra Coupe, MD  [phone] 05/09/2023      Narrative & Impression  HISTORY: Multiple myeloma. Difficulty swallowing.     Dual phase esophagram     FINDINGS:  The patient swallowed barium tablet, gas crystals, thick barium, and thin  barium.        Esophageal contour and caliber is within normal limits. No stricture or  ulceration identified. Moderate to severe esophageal dysmotility with secondary  and tertiary contractions and stasis in the prone position. No difficulty  swallowing the barium tablet. DAP 628 dGycm2.     IMPRESSION:  1. Moderate to severe esophageal dysmotility otherwise no acute abnormality.          Due to the patients chronic benign and/or malignant laryngeal disease and/or history of laryngeal neoplasm for surveillance a flexible laryngoscopy will be performed to complete a significant physical examination of the patient which cannot be performed by indirect mirror (ie. Gag or incomplete visualization, see physical exam notes). Failure to provide this procedure may lead to late detection of significant chronic benign disease, acute exacerbation, resolution or failure of early diagnosis of recurrent cancer. The procedure report is present in the body of the chart.     Flexible Laryngoscopy    Pre op: dysphagia.   Post op: Same  Procedure : FlexibleLaryngoscopy  Surgeon: Ileene Rubens  Anesthesia: Afrin with 2% lidocaine  Estimated Blood Loss:  None    Procedure:   Flexible Laryngoscopy     After obtaining consent, the patient was placed in the examination chair in the upright position.  Decongestant and topical anesthetic was sprayed in the After allowing adequate time for hemostatic effect, the flexible 4 mm laryngoscope was passed via the     Nasal Septum: normal;   Nasal Findings: normal;   Nasopharynx: normal   Eustachian Tube: normal   Oropharynx: normal   Base of Tongue: normal   Epiglottis: normal   True Vocal Cord normal   False Vocal Cord: normal   True Vocal Cord Movement: normal   Hypopharynx Mucosa: normal  Arytenoids: normal     * Patient tolerated the procedure well with no complications   * Patient was instructed not to eat for 30 minutes following procedure.   * Patient was instructed that they may notice minor bleeding.    Attestation:   I was present for the entire viewing, including introduction and removal of the scope.      Assessment:      Diagnosis Orders   1. Esophageal dysphagia  [place] - Steffanie Dunn, [place], [place] - Speech and Language Pathology - [facility]             Plan:     Personally reviewed and  interpreted CT neck. Normal.   Personally reviewed and interpreted esophagram. No pharyngeal dysfunction.   Referral to SLP.   Follow up as needed.     Vernie Ammons, MD

## 2023-06-12 ENCOUNTER — Encounter: Payer: MEDICARE | Attending: Nephrology | Primary: Family Medicine

## 2023-06-16 ENCOUNTER — Encounter

## 2023-06-16 ENCOUNTER — Ambulatory Visit: Payer: MEDICARE | Primary: Family Medicine

## 2023-06-17 ENCOUNTER — Encounter

## 2023-06-18 NOTE — Progress Notes (Signed)
[facility] ENT SLP  Late Cancellation/No-show Note  Name: TRIANA COOVER  Date of Birth: 04-02-1951  MRN: [id number]  Cancelled visits to date:   No-shows to date:     OP attendance policy states 2 consecutive no-shows or 3 missed sessions within the last 60 days may result in discharge from therapy. Cancellation less than 12 hours before the appointment time is considered a no-show. Additionally, evaluation or therapy may be unable to be provided if patient arrives late by 15 minutes or more.     For today's appointment patient:  [x]   Late Cancelled  []   Rescheduled appointment  []   No-show     Reason given by patient:  []   Patient ill  []   Conflicting appointment  []   No transportation                          []   Conflict with work  [x]   No reason given  []   Other:                Comments:        Thank you,    Gladstone Pih) [place], [place], [place]; [id number]  Speech-Language Pathologist

## 2023-06-30 ENCOUNTER — Inpatient Hospital Stay: Admit: 2023-06-30 | Payer: MEDICARE | Primary: Family Medicine

## 2023-06-30 DIAGNOSIS — R109 Unspecified abdominal pain: Secondary | ICD-10-CM

## 2023-06-30 LAB — POCT VENOUS
Est, Glom Filt Rate: 67 (ref >60)
POC Creatinine: 0.9 mg/dL (ref 0.6–1.2)

## 2023-06-30 MED ORDER — BARIUM SULFATE 2 % PO SUSP
2 | Freq: Once | ORAL | Status: AC | PRN
Start: 2023-06-30 — End: 2023-06-30
  Administered 2023-06-30: 20:00:00 900 mL via ORAL

## 2023-06-30 MED ORDER — IOPAMIDOL 76 % IV SOLN
76 | Freq: Once | INTRAVENOUS | Status: AC | PRN
Start: 2023-06-30 — End: 2023-06-30
  Administered 2023-06-30: 20:00:00 75 mL via INTRAVENOUS

## 2023-07-07 ENCOUNTER — Encounter: Payer: MEDICARE | Attending: Nephrology | Primary: Family Medicine

## 2023-07-09 ENCOUNTER — Encounter: Payer: MEDICARE | Attending: Speech-Language Pathologist | Primary: Family Medicine

## 2023-07-09 NOTE — Progress Notes (Deleted)
 [facility] Ear, Nose, and Throat  SPEECH THERAPY  Fiberoptic Endoscopic Evaluation of Swallowing  (FEES)/Treatment      Name: Gabrielle Wells  Date of Birth: 09-May-1951  Gender: female  MRN: [id number]  Referring Physician: Dr. Tanda Rockers  Diagnosis: Esophag

## 2023-07-09 NOTE — Progress Notes (Deleted)
 [facility] ENT SLP  Late Cancellation/No-show Note  Name: Gabrielle Wells  Date of Birth: 09-08-1950  MRN: [id number]  Cancelled visits to date: 1  No-shows to date: 1    OP attendance policy states 2 consecutive no-shows or 3 missed sessions within the

## 2023-07-09 NOTE — Progress Notes (Signed)
 [facility] ENT SLP  Late Cancellation/No-show Note  Name: Gabrielle Wells  Date of Birth: 09-08-1950  MRN: [id number]  Cancelled visits to date: 1  No-shows to date: 1    OP attendance policy states 2 consecutive no-shows or 3 missed sessions within the

## 2023-07-10 ENCOUNTER — Encounter: Payer: MEDICARE | Attending: Nephrology | Primary: Family Medicine

## 2023-07-29 ENCOUNTER — Encounter: Admit: 2023-07-29 | Payer: MEDICARE | Admitting: Nephrology | Primary: Family Medicine

## 2023-07-29 VITALS — BP 131/96 | HR 77 | Wt 236.0 lb

## 2023-07-29 DIAGNOSIS — N183 Chronic kidney disease, stage 3 unspecified (HCC): Principal | ICD-10-CM

## 2023-07-29 NOTE — Progress Notes (Signed)
 Jerene Dilling MD   Allegra Grana MD FASN [facility]       [facility]      Nephrology Consult Note    Reason for Consult:  AKI     Chief Complaint:  CHF   History Obtained From:  Patient     07/29/23     Pt has CKD 3   Diagn

## 2023-08-22 ENCOUNTER — Inpatient Hospital Stay
Admit: 2023-08-22 | Discharge: 2023-08-23 | Disposition: A | Discharge status: Home / Self Care | Payer: MEDICARE | Admitting: Emergency Medicine

## 2023-08-22 ENCOUNTER — Encounter: Payer: MEDICARE | Attending: Speech-Language Pathologist | Primary: Family Medicine

## 2023-08-22 DIAGNOSIS — R42 Dizziness and giddiness: Secondary | ICD-10-CM

## 2023-08-22 DIAGNOSIS — S0990XA Unspecified injury of head, initial encounter: Secondary | ICD-10-CM

## 2023-08-22 LAB — EKG 12-LEAD
Atrial Rate: 78 {beats}/min
P Axis: -21 degrees
P-R Interval: 144 ms
Q-T Interval: 418 ms
QRS Duration: 72 ms
QTc Calculation (Bazett): 476 ms
R Axis: 4 degrees
T Axis: -41 degrees
Ventricular Rate: 78 {beats}/min

## 2023-08-22 LAB — POCT GLUCOSE: POC Glucose: 150 mg/dL — ABNORMAL HIGH (ref 70–99)

## 2023-08-22 MED ORDER — MORPHINE SULFATE 4 MG/ML IV SOLN
4 | Freq: Once | INTRAVENOUS | Status: AC
Start: 2023-08-22 — End: 2023-08-22
  Administered 2023-08-22: 4 mg via INTRAVENOUS

## 2023-08-22 MED ORDER — ONDANSETRON HCL 4 MG/2ML IJ SOLN
4 | Freq: Once | INTRAMUSCULAR | Status: AC
Start: 2023-08-22 — End: 2023-08-22
  Administered 2023-08-22: 4 mg via INTRAVENOUS

## 2023-08-22 MED FILL — ONDANSETRON HCL 4 MG/2ML IJ SOLN: 4 MG/2ML | INTRAMUSCULAR | Qty: 2

## 2023-08-22 MED FILL — MORPHINE SULFATE 4 MG/ML IJ SOLN: 4 mg/mL | INTRAMUSCULAR | Qty: 1

## 2023-08-22 NOTE — Discharge Instructions (Signed)
 You were seen for head injury and a fall. Return if your symptoms change or worsen.

## 2023-08-22 NOTE — ED Provider Notes (Signed)
 ED Attending Attestation Note     Date of evaluation: 08/22/2023    This patient was seen by the advance practice provider.  I have seen and examined the patient, agree with the workup, evaluation, management and diagnosis. The care plan has been discussed.  I have reviewed the ECG and concur with the APP's interpretation.      Briefly, Gabrielle Wells is a 73 y.o. female with a PMH inclusive of MM who presents for evaluation of fall. Bent over to pick something up and fell forward hitting her head.     Notable exam findings include diffuse tenderness throughout her entire body, but no deformity.    Assessment/ Medical Decision Making:     CT of the head a c spine negative. Collar cleared. Patient eager for discharge. Tolerated PO and ambulated. Will discharge.        Kristie Raymona CROME, MD  08/23/23 [phone]

## 2023-08-22 NOTE — ED Notes (Signed)
Pt unable to provide urine sample at this time. Will obtain urine sample at a later time.        Yetta Glassman, RN  08/22/23 2019

## 2023-08-22 NOTE — ED Provider Notes (Signed)
 [facility]  EMERGENCY DEPARTMENT ENCOUNTER          PHYSICIAN ASSISTANT NOTE       Date of evaluation: 08/22/2023    Chief Complaint     Fall      History of Present Illness     Gabrielle Wells is a 73 y.o. female with a history of GAD, GERD, CKD stage III, thrombocytopenia, HLD, BPPV, CHF w/ negative stress test in 2022 with EF of 65%, multiple myeloma that presents emergency department with a chief complaint of a fall. Patient states that she got home from chemo today and leaned over to get something when she got lightheaded and fell. She does recall hitting her head and believes she lost consciousness for several minutes. She states that she was stuck on the floor and yelled in pain for about 45 minutes when her neighbors called the ambulance. Patient states that she is primarily having pain in her head, neck, left side and tailbone. She denies pain elsewhere. Denies nausea, vomiting, chest pain, shortness of breath. She does not feel like she lost consciousness prior to falling.     ASSESSMENT / PLAN  (MEDICAL DECISION MAKING)     INITIAL VITALS: BP: (!) 163/93, Temp: 98.8 F (37.1 C), Pulse: 84, Respirations: 17, SpO2: 100 %    Gabrielle Wells is a 73 y.o. female that presents to the emergency department with above-stated complaints.  Many differential diagnoses were considered in her presentation but the patient fell and hit her head.  It appears somewhat consistent with a syncopal episode or head contact with loss consciousness.  Physical examination revealed no gross signs of head trauma although she is complaining of head pain and is on Eliquis .  She does have mild cervical spine tenderness but does have full range of motion.  CT was completed head and cervical spine which were negative.  Syncope workup was initiated.  Patient's troponin is negative, BNP is negative, hepatic function panel is unremarkable.  The patient CBC reveals a slight leukopenia at 3.5.  The patient's platelets are  decreased at 128.  Actually improved from prior.  CMP reveals normal creatinine.  She is mildly hyperglycemic.  Chest x-ray reveals mild right basilar airspace disease versus atelectasis.  This is new from prior chest imaging.  The patient does not have any clinical signs of pneumonia.  Urine has small leukocyte esterase but no nitrates and no symptoms of urinary tract infection therefore urine culture was sent and is pending.  She does have over 100 epithelial cells indicating possible contamination.  Patient is able to ambulate without assistance and would like to be discharged at this time.  She has agreed to return if her symptoms change or worsen follow-up with your primary care physician as an outpatient.    Is this patient to be included in the SEP-1 core measure? No Exclusion criteria - the patient is NOT to be included for SEP-1 Core Measure due to: Infection is not suspected    Medical Decision Making  Amount and/or Complexity of Data Reviewed  Labs: ordered.  Radiology: ordered.  ECG/medicine tests: ordered.    Risk  Prescription drug management.        This patient was also evaluated by the attending physician. All care plans were discussed and agreed upon.    Clinical Impression     1. Closed head injury without concussion, initial encounter        Disposition     PATIENT REFERRED  TO:  No follow-up provider specified.    DISCHARGE MEDICATIONS:  Discharge Medication List as of 08/22/2023  9:59 PM          DISPOSITION Decision To Discharge 08/22/2023 09:49:19 PM   DISPOSITION CONDITION Stable             Diagnostic Results and Other Data     RADIOLOGY:  XR CHEST (2 VW)   Final Result   1.  Mild right basilar airspace disease or atelectasis.      Electronically signed by Josefa Sorrel      CT CERVICAL SPINE WO CONTRAST   Final Result      Cervical degenerative changes. No acute fracture.      Electronically signed by Alm Post      CT HEAD WO CONTRAST   Final Result      1. Atrophy and chronic ischemia  without acute intracranial abnormalities.   If there is concern for acute infarct, MRI is considered more sensitive.      Electronically signed by Alm Post          LABS:   Results for orders placed or performed during the hospital encounter of 08/22/23   CBC with Auto Differential   Result Value Ref Range    WBC 3.5 (L) 4.0 - 11.0 K/uL    RBC 5.31 (H) 4.00 - 5.20 M/uL    Hemoglobin 15.2 12.0 - 16.0 g/dL    Hematocrit 52.3 63.9 - 48.0 %    MCV 89.6 80.0 - 100.0 fL    MCH 28.7 26.0 - 34.0 pg    MCHC 32.0 31.0 - 36.0 g/dL    RDW 86.1 87.5 - 84.5 %    Platelets 128 (L) 135 - 450 K/uL    MPV 11.5 (H) 5.0 - 10.5 fL    Neutrophils % 83.0 %    Lymphocytes % 15.0 %    Monocytes % 2.0 %    Eosinophils % 0.0 %    Basophils % 0.0 %    Neutrophils Absolute 2.9 1.7 - 7.7 K/uL    Lymphocytes Absolute 0.5 (L) 1.0 - 5.1 K/uL    Monocytes Absolute 0.1 0.0 - 1.3 K/uL    Eosinophils Absolute 0.0 0.0 - 0.6 K/uL    Basophils Absolute 0.0 0.0 - 0.2 K/uL   CMP w/ Reflex to MG   Result Value Ref Range    Sodium 140 136 - 145 mmol/L    Potassium reflex Magnesium  4.7 3.5 - 5.1 mmol/L    Chloride 108 99 - 110 mmol/L    CO2 20 (L) 21 - 32 mmol/L    Anion Gap 12 3 - 16    Glucose 143 (H) 70 - 99 mg/dL    BUN 9 7 - 20 mg/dL    Creatinine 1.0 0.6 - 1.2 mg/dL    Est, Glom Filt Rate 59 (A) >60    Calcium  9.0 8.3 - 10.6 mg/dL    Total Protein 6.7 6.4 - 8.2 g/dL    Albumin 4.2 3.4 - 5.0 g/dL    Albumin/Globulin Ratio 1.7 1.1 - 2.2    Total Bilirubin 0.5 0.0 - 1.0 mg/dL    Alkaline Phosphatase 77 40 - 129 U/L    ALT 9 (L) 10 - 40 U/L    AST 30 15 - 37 U/L   Hepatic Function Panel   Result Value Ref Range    Bilirubin, Direct <0.1 0.0 - 0.3 mg/dL    Bilirubin, Indirect 0.4 0.0 -  1.0 mg/dL   Troponin   Result Value Ref Range    Troponin, High Sensitivity 11 0 - 14 ng/L   Troponin   Result Value Ref Range    Troponin, High Sensitivity 9 0 - 14 ng/L   BNP   Result Value Ref Range    NT Pro-BNP 149 0 - 449 pg/mL   Urinalysis with Reflex to Culture     Specimen: Urine   Result Value Ref Range    Color, UA Yellow Straw/Yellow    Clarity, UA Clear Clear    Glucose, Ur Negative Negative mg/dL    Bilirubin, Urine Negative Negative    Ketones, Urine TRACE (A) Negative mg/dL    Specific Gravity, UA 1.025 1.005 - 1.030    Blood, Urine TRACE-INTACT (A) Negative    pH, Urine 6.0 5.0 - 8.0    Protein, UA TRACE (A) Negative mg/dL    Urobilinogen, Urine 0.2 <2.0 E.U./dL    Nitrite, Urine Negative Negative    Leukocyte Esterase, Urine SMALL (A) Negative    Microscopic Examination YES     Urine Type NotGiven     Urine Reflex to Culture Not Indicated    Microscopic Urinalysis   Result Value Ref Range    Hyaline Casts, UA 3-5 (A) 0 - 2 /LPF    Mucus, UA 2+ (A) None Seen /LPF    WBC, UA 6-9 (A) 0 - 5 /HPF    RBC, UA 5-10 (A) 0 - 4 /HPF    Epithelial Cells, UA >100 (A) 0 - 5 /HPF    Bacteria, UA 4+ (A) None Seen /HPF   POCT Glucose   Result Value Ref Range    POC Glucose 150 (H) 70 - 99 mg/dl    Performed on ACCU-CHEK    EKG 12 Lead   Result Value Ref Range    Ventricular Rate 78 BPM    Atrial Rate 78 BPM    P-R Interval 144 ms    QRS Duration 72 ms    Q-T Interval 418 ms    QTc Calculation (Bazett) 476 ms    P Axis -21 degrees    R Axis 4 degrees    T Axis -41 degrees    Diagnosis       Normal sinus rhythmLow voltage QRSNonspecific T wave abnormalityAbnormal ECGEKG performed in ER and to be interpreted by ER physician.Confirmed by MD, ER ([id number]), editor GARTH, CAMPBELL ([id number]) on 08/22/2023 5:51:11 PM     EKG   Interpreted in conjunction with emergency department physician No att. providers found  Ventricular rate 78 bpm, PR interval 144 ms, QRS duration 72, QTc 4 and 76 ms.  Normal sinus rhythm, no QRS present, no ST elevation or depression, T wave inversion in lead III and T wave inversion in lead III.  Impression: Nonspecific P and T wave changes but otherwise normal EKG with no signs of acute ischemia T wave inversions unchanged from September 2022 although the patient no longer has  T wave inversions in aVF.  T wave inversions in lead III new from previous.    ED BEDSIDE ULTRASOUND:  No results found.    RECENT VITALS:  BP: (!) 145/94, Temp: 98.8 F (37.1 C), Pulse: 90, Respirations: 21, SpO2: 97 %     Procedures     None    ED Course     Nursing Notes, Past Medical Hx,Past Surgical Hx, Social Hx, Allergies, and Family Hx were reviewed.  The patient was given the following medications:  Orders Placed This Encounter   Medications    morphine  injection 4 mg    ondansetron  (ZOFRAN ) injection 4 mg       CONSULTS:  None    Review of Systems     Review of Systems    Past Medical, Surgical, Family, and Social History     She has a past medical history of Adenocarcinoma of colon (HCC), Anesthesia, Arthritis, Asthma, CHF (congestive heart failure) (HCC), CKD (chronic kidney disease), Epidural abscess, GERD (gastroesophageal reflux disease), HTN (hypertension), Hyperlipidemia, IBS (irritable bowel syndrome), Incontinence of urine, Lumbar radiculopathy, Morbid obesity with BMI of 45.0-49.9, adult, Multiple allergies, Multiple myeloma (HCC), Muscle fasciculation, Neuropathic pain, Prolonged emergence from general anesthesia, SBO (small bowel obstruction) (HCC), Spinal fracture of T8 vertebra (HCC), Surgical site infection, Urinary incontinence, and Uses walker.  She has a past surgical history that includes Appendectomy; Breast lumpectomy (Bilateral); Hysterectomy, total abdominal; Cholecystectomy; Total knee arthroplasty (Bilateral, 2009); Cardiac catheterization (2012); Total shoulder arthroplasty w/ distal clavicle excision (Right, 2010); Abdominal hernia repair (2010); Colonoscopy; Upper gastrointestinal endoscopy (N/A, 05/26/2020); Colonoscopy (05/26/2020); Colonoscopy (05/26/2020); CT BIOPSY BONE MARROW (01/03/2021); Abdomen surgery (N/A, 05/16/2021); XR MIDLINE EQUAL OR GREATER THAN 5 YEARS (07/28/2018); Ovary removal; Colonoscopy (N/A, 11/08/2022); Port Surgery (Left, 12/11/2022); and  Colonoscopy (N/A, 12/16/2022).  Her family history includes Breast Cancer in her maternal aunt; Cancer in her paternal grandmother.  She reports that she has never smoked. She has never used smokeless tobacco. She reports that she does not drink alcohol and does not use drugs.    Medications     Discharge Medication List as of 08/22/2023  9:59 PM        CONTINUE these medications which have NOT CHANGED    Details   apixaban  (ELIQUIS ) 5 MG TABS tablet 2 tabletsHistorical Med      acyclovir (ZOVIRAX) 400 MG tablet Take 1 tablet by mouth 2 times dailyHistorical Med      lenalidomide (REVLIMID) 25 MG chemo capsule Take 1 capsule by mouth dailyHistorical Med      predniSONE  (DELTASONE ) 50 MG tablet Take one tab 13 hours, 6 hours and 1 hour prior to CT Scan, Disp-3 tablet, R-0Normal      furosemide  (LASIX ) 20 MG tablet Take 2 tablets by mouth daily Take 80 mg if wt goes up 3-5lbs, Disp-30 tablet, R-1Normal      gabapentin  (NEURONTIN ) 300 MG capsule TAKE ONE CAPSULE BY MOUTH 3 TIMES DAILY, Disp-90 capsule, R-0Normal      carvedilol  (COREG ) 25 MG tablet TAKE ONE TABLET BY MOUTH TWICE DAILY, Disp-180 tablet, R-3Normal      valsartan  (DIOVAN ) 320 MG tablet Take 1 tablet by mouth daily, Disp-90 tablet, R-3Normal      atorvastatin  (LIPITOR) 40 MG tablet Take 1 tablet by mouth daily, Disp-90 tablet, R-1Normal      Cholecalciferol  (VITAMIN D3) 25 MCG TABS TAKE TWO TABLETS BY MOUTH DAILY, Disp-60 tablet, R-2Normal      albuterol  sulfate HFA (VENTOLIN  HFA) 108 (90 Base) MCG/ACT inhaler Inhale 2 puffs into the lungs 4 times daily as needed for Wheezing, Disp-18 g, R-0Normal      tiZANidine  (ZANAFLEX ) 2 MG tablet Take 1 tablet by mouth nightly as needed (muscle spasms), Disp-30 tablet, R-1Normal      ondansetron  (ZOFRAN -ODT) 4 MG disintegrating tablet DISSOLVE 1 TABLET ON THE TONGUE EVERY 8 HOURS AS NEEDED FOR NAUSEA, Disp-21 tablet, R-0Normal      omeprazole  (PRILOSEC) 20 MG delayed release capsule Take  1 capsule by mouth every morning  (before breakfast), Disp-90 capsule, R-1Normal      acetaminophen  (TYLENOL ) 500 MG tablet Take 1 tablet by mouth every 6 hours as needed for PainHistorical Med             Allergies     She is allergic to aspirin , ibuprofen, mushroom extract complex (do not select), shellfish-derived products, sulfa antibiotics, sulfasalazine, vancomycin, amoxicillin-pot clavulanate, coconut flavoring agent (non-screening), egg-derived products, iodinated contrast media, shellfish allergy , acetaminophen , amoxicillin, coconut oil, iodides, povidone-iodine, ciprofloxacin , coconut (cocos nucifera), codeine, fish-derived products, iodine, metronidazole , other, povidone iodine, and sulfacetamide.    Physical Exam     INITIAL VITALS: BP: (!) 163/93, Temp: 98.8 F (37.1 C), Pulse: 84, Respirations: 17, SpO2: 100 %  Physical Exam  Constitutional:       General: She is not in acute distress.     Appearance: Normal appearance. She is not ill-appearing, toxic-appearing or diaphoretic.   HENT:      Head: Normocephalic and atraumatic.      Nose: Nose normal.   Eyes:      Extraocular Movements: Extraocular movements intact.      Conjunctiva/sclera: Conjunctivae normal.   Neck:      Comments: Mild tenderness to palpation of the cervical spine at the midline at the C3-C5 level.  No cervical spinal tenderness otherwise.  No cervical paraspinal muscle tenderness.  Rest of spine is mild tenderness to palpation of the paraspinal muscles without any step-offs or gross deformities noted midline tenderness.  Cardiovascular:      Rate and Rhythm: Normal rate.      Heart sounds: No murmur heard.     No friction rub. No gallop.   Pulmonary:      Effort: Pulmonary effort is normal.      Breath sounds: No stridor. No wheezing, rhonchi or rales.   Abdominal:      Tenderness: There is no abdominal tenderness.   Musculoskeletal:         General: No swelling, tenderness or deformity.      Cervical back: Neck supple.      Comments: Upper and lower extremities are  completely nontender to palpation.  Negative logroll bilaterally.  Patient moves extremities without difficulty.  No tenderness or crepitus or gross deformities over the anterior, lateral posterior thorax.   Skin:     General: Skin is warm and dry.      Capillary Refill: Capillary refill takes less than 2 seconds.      Findings: No bruising.   Neurological:      General: No focal deficit present.      Mental Status: She is alert and oriented to person, place, and time.      Sensory: No sensory deficit.   Psychiatric:         Mood and Affect: Mood normal.         Behavior: Behavior normal.          Marcelle Bernardino BROCKS, PA-C  08/23/23 0014

## 2023-08-22 NOTE — ED Triage Notes (Addendum)
 Pt brought in by medic into the ER from home with complaint of fall on concrete. Pt left chemo treatment around 4pm. When walking pt bent down and felt  dizzy and hit head on concrete. Pt reports not eating anything today. Pt is on eliquis . Pt denies neck pain, loc, blurry visions, and headache.   Pt denies medication prior to arrival.  Pt is A&OX4. Respirations are even and unlabored. Skin is warm, appropriate for ethnicity, and dry. No acute distress noted.

## 2023-08-22 NOTE — ED Notes (Signed)
 Pt ambulates with steady gait with walker. Pt denies sob, cp, and dizziness.       Yetta Glassman, RN  08/22/23 2108

## 2023-08-23 LAB — URINALYSIS WITH REFLEX TO CULTURE
Bilirubin, Urine: NEGATIVE
Glucose, Ur: NEGATIVE mg/dL
Nitrite, Urine: NEGATIVE
Specific Gravity, UA: 1.025 (ref 1.005–1.030)
Urobilinogen, Urine: 0.2 U/dL (ref <2.0)
pH, Urine: 6 (ref 5.0–8.0)

## 2023-08-23 LAB — COMPREHENSIVE METABOLIC PANEL W/ REFLEX TO MG FOR LOW K
ALT: 9 U/L — ABNORMAL LOW (ref 10–40)
AST: 30 U/L (ref 15–37)
Albumin/Globulin Ratio: 1.7 (ref 1.1–2.2)
Albumin: 4.2 g/dL (ref 3.4–5.0)
Alkaline Phosphatase: 77 U/L (ref 40–129)
Anion Gap: 12 (ref 3–16)
BUN: 9 mg/dL (ref 7–20)
CO2: 20 mmol/L — ABNORMAL LOW (ref 21–32)
Calcium: 9 mg/dL (ref 8.3–10.6)
Chloride: 108 mmol/L (ref 99–110)
Creatinine: 1 mg/dL (ref 0.6–1.2)
Est, Glom Filt Rate: 59 — AB (ref >60)
Glucose: 143 mg/dL — ABNORMAL HIGH (ref 70–99)
Potassium reflex Magnesium: 4.7 mmol/L (ref 3.5–5.1)
Sodium: 140 mmol/L (ref 136–145)
Total Bilirubin: 0.5 mg/dL (ref 0.0–1.0)
Total Protein: 6.7 g/dL (ref 6.4–8.2)

## 2023-08-23 LAB — CBC WITH AUTO DIFFERENTIAL
Basophils %: 0 %
Basophils Absolute: 0 10*3/uL (ref 0.0–0.2)
Eosinophils %: 0 %
Eosinophils Absolute: 0 10*3/uL (ref 0.0–0.6)
Hematocrit: 47.6 % (ref 36.0–48.0)
Hemoglobin: 15.2 g/dL (ref 12.0–16.0)
Lymphocytes %: 15 %
Lymphocytes Absolute: 0.5 10*3/uL — ABNORMAL LOW (ref 1.0–5.1)
MCH: 28.7 pg (ref 26.0–34.0)
MCHC: 32 g/dL (ref 31.0–36.0)
MCV: 89.6 fL (ref 80.0–100.0)
MPV: 11.5 fL — ABNORMAL HIGH (ref 5.0–10.5)
Monocytes %: 2 %
Monocytes Absolute: 0.1 10*3/uL (ref 0.0–1.3)
Neutrophils %: 83 %
Neutrophils Absolute: 2.9 10*3/uL (ref 1.7–7.7)
Platelets: 128 10*3/uL — ABNORMAL LOW (ref 135–450)
RBC: 5.31 M/uL — ABNORMAL HIGH (ref 4.00–5.20)
RDW: 13.8 % (ref 12.4–15.4)
WBC: 3.5 10*3/uL — ABNORMAL LOW (ref 4.0–11.0)

## 2023-08-23 LAB — HEPATIC FUNCTION PANEL
Bilirubin, Direct: <0.1 mg/dL (ref 0.0–0.3)
Bilirubin, Indirect: 0.4 mg/dL (ref 0.0–1.0)

## 2023-08-23 LAB — MICROSCOPIC URINALYSIS: Epithelial Cells, UA: >100 /[HPF] — AB (ref 0–5)

## 2023-08-23 LAB — TROPONIN
Troponin, High Sensitivity: 11 ng/L (ref 0–14)
Troponin, High Sensitivity: 9 ng/L (ref 0–14)

## 2023-08-23 LAB — BRAIN NATRIURETIC PEPTIDE: NT Pro-BNP: 149 pg/mL (ref 0–449)

## 2023-09-02 ENCOUNTER — Emergency Department: Admit: 2023-09-02 | Payer: MEDICARE | Primary: Family Medicine

## 2023-09-02 ENCOUNTER — Inpatient Hospital Stay
Admission: EM | Admit: 2023-09-02 | Discharge: 2023-09-05 | Disposition: A | Discharge status: Skilled Nursing Facility | Payer: MEDICARE | Admitting: Surgery

## 2023-09-02 DIAGNOSIS — A419 Sepsis, unspecified organism: Principal | ICD-10-CM

## 2023-09-02 DIAGNOSIS — R531 Weakness: Secondary | ICD-10-CM

## 2023-09-02 LAB — LACTATE, SEPSIS: Lactic Acid, Sepsis: 1.7 mmol/L (ref 0.4–1.9)

## 2023-09-02 LAB — BASIC METABOLIC PANEL W/ REFLEX TO MG FOR LOW K
Anion Gap: 12 (ref 3–16)
BUN: 7 mg/dL (ref 7–20)
CO2: 23 mmol/L (ref 21–32)
Calcium: 9.6 mg/dL (ref 8.3–10.6)
Chloride: 104 mmol/L (ref 99–110)
Creatinine: 0.9 mg/dL (ref 0.6–1.2)
Est, Glom Filt Rate: 67 (ref >60)
Glucose: 138 mg/dL — ABNORMAL HIGH (ref 70–99)
Potassium reflex Magnesium: 4 mmol/L (ref 3.5–5.1)
Sodium: 139 mmol/L (ref 136–145)

## 2023-09-02 LAB — BRAIN NATRIURETIC PEPTIDE: NT Pro-BNP: 420 pg/mL (ref 0–449)

## 2023-09-02 LAB — CBC WITH AUTO DIFFERENTIAL
Basophils %: 0 %
Basophils Absolute: 0 10*3/uL (ref 0.0–0.2)
Eosinophils %: 0 %
Eosinophils Absolute: 0 10*3/uL (ref 0.0–0.6)
Hematocrit: 46.9 % (ref 36.0–48.0)
Hemoglobin: 15.2 g/dL (ref 12.0–16.0)
Lymphocytes %: 17 %
Lymphocytes Absolute: 1.1 10*3/uL (ref 1.0–5.1)
MCH: 28.6 pg (ref 26.0–34.0)
MCHC: 32.4 g/dL (ref 31.0–36.0)
MCV: 88.4 fL (ref 80.0–100.0)
MPV: 10.1 fL (ref 5.0–10.5)
Monocytes %: 23 %
Monocytes Absolute: 1.5 10*3/uL — ABNORMAL HIGH (ref 0.0–1.3)
Neutrophils %: 60 %
Neutrophils Absolute: 4 10*3/uL (ref 1.7–7.7)
PLATELET SLIDE REVIEW: DECREASED
Platelets: 87 10*3/uL — ABNORMAL LOW (ref 135–450)
RBC: 5.31 M/uL — ABNORMAL HIGH (ref 4.00–5.20)
RDW: 13.5 % (ref 12.4–15.4)
WBC: 6.6 10*3/uL (ref 4.0–11.0)

## 2023-09-02 LAB — TROPONIN
Troponin, High Sensitivity: 13 ng/L (ref 0–14)
Troponin, High Sensitivity: 15 ng/L — ABNORMAL HIGH (ref 0–14)

## 2023-09-02 LAB — URINALYSIS WITH REFLEX TO CULTURE
Bilirubin, Urine: NEGATIVE
Blood, Urine: NEGATIVE
Glucose, Ur: NEGATIVE mg/dL
Ketones, Urine: NEGATIVE mg/dL
Leukocyte Esterase, Urine: NEGATIVE
Nitrite, Urine: NEGATIVE
Protein, UA: NEGATIVE mg/dL
Specific Gravity, UA: 1.02 (ref 1.005–1.030)
Urobilinogen, Urine: 0.2 U/dL (ref <2.0)
pH, Urine: 7.5 (ref 5.0–8.0)

## 2023-09-02 LAB — ANTI-XA, HEPARIN: Anti-XA Unfrac Heparin: 0.12 [IU]/mL — ABNORMAL LOW (ref 0.30–0.70)

## 2023-09-02 LAB — EKG 12-LEAD
Atrial Rate: 115 {beats}/min
P Axis: -15 degrees
P-R Interval: 154 ms
Q-T Interval: 290 ms
QRS Duration: 68 ms
QTc Calculation (Bazett): 401 ms
R Axis: 20 degrees
T Axis: -34 degrees
Ventricular Rate: 115 {beats}/min

## 2023-09-02 LAB — COVID-19 & INFLUENZA COMBO
Influenza A: NOT DETECTED
Influenza B: NOT DETECTED
SARS-CoV-2 RNA, RT PCR: NOT DETECTED

## 2023-09-02 MED ORDER — CEFEPIME HCL 2 GM/100ML IV SOLN
2 | Freq: Once | INTRAVENOUS | Status: AC
Start: 2023-09-02 — End: 2023-09-02
  Administered 2023-09-02: 2000 mg via INTRAVENOUS

## 2023-09-02 MED ORDER — VALSARTAN 320 MG PO TABS
320 | Freq: Every day | ORAL | Status: DC
Start: 2023-09-02 — End: 2023-09-05
  Administered 2023-09-03 – 2023-09-05 (×3): 320 mg via ORAL

## 2023-09-02 MED ORDER — POLYETHYLENE GLYCOL 3350 17 G PO PACK
17 | Freq: Every day | ORAL | Status: DC | PRN
Start: 2023-09-02 — End: 2023-09-05

## 2023-09-02 MED ORDER — IPRATROPIUM-ALBUTEROL 0.5-2.5 (3) MG/3ML IN SOLN
0.5-2.5 | RESPIRATORY_TRACT | Status: DC
Start: 2023-09-02 — End: 2023-09-02
  Administered 2023-09-03: 02:00:00 1 via RESPIRATORY_TRACT

## 2023-09-02 MED ORDER — ACETAMINOPHEN 650 MG RE SUPP
650 | Freq: Four times a day (QID) | RECTAL | Status: DC | PRN
Start: 2023-09-02 — End: 2023-09-05

## 2023-09-02 MED ORDER — SODIUM CHLORIDE 0.9 % IV SOLN
0.9 | INTRAVENOUS | Status: DC | PRN
Start: 2023-09-02 — End: 2023-09-05
  Administered 2023-09-03: 23:00:00 via INTRAVENOUS

## 2023-09-02 MED ORDER — TIZANIDINE HCL 4 MG PO TABS
4 | Freq: Every evening | ORAL | Status: DC | PRN
Start: 2023-09-02 — End: 2023-09-02

## 2023-09-02 MED ORDER — POTASSIUM BICARB-CITRIC ACID 20 MEQ PO TBEF
20 | ORAL | Status: AC | PRN
Start: 2023-09-02 — End: 2023-09-03

## 2023-09-02 MED ORDER — BENZOCAINE-MENTHOL 6-10 MG MT LOZG
6-10 | OROMUCOSAL | Status: DC | PRN
Start: 2023-09-02 — End: 2023-09-05
  Administered 2023-09-05: 02:00:00 1 via ORAL

## 2023-09-02 MED ORDER — TIZANIDINE HCL 4 MG PO TABS
4 | Freq: Once | ORAL | Status: AC
Start: 2023-09-02 — End: 2023-09-02
  Administered 2023-09-02: 18:00:00 2 mg via ORAL

## 2023-09-02 MED ORDER — POTASSIUM CHLORIDE 10 MEQ/100ML IV SOLN
10 | INTRAVENOUS | Status: AC | PRN
Start: 2023-09-02 — End: 2023-09-03

## 2023-09-02 MED ORDER — NORMAL SALINE FLUSH 0.9 % IV SOLN
0.9 | INTRAVENOUS | Status: DC | PRN
Start: 2023-09-02 — End: 2023-09-05

## 2023-09-02 MED ORDER — LABETALOL HCL 5 MG/ML IV SOLN
5 | Freq: Four times a day (QID) | INTRAVENOUS | Status: DC | PRN
Start: 2023-09-02 — End: 2023-09-05
  Administered 2023-09-02: 23:00:00 10 mg via INTRAVENOUS

## 2023-09-02 MED ORDER — OXYCODONE HCL 5 MG PO TABS
5 | Freq: Once | ORAL | Status: AC
Start: 2023-09-02 — End: 2023-09-02
  Administered 2023-09-02: 19:00:00 2.5 mg via ORAL

## 2023-09-02 MED ORDER — VALSARTAN 320 MG PO TABS
320 | Freq: Once | ORAL | Status: AC
Start: 2023-09-02 — End: 2023-09-02
  Administered 2023-09-02: 21:00:00 320 mg via ORAL

## 2023-09-02 MED ORDER — VALSARTAN 320 MG PO TABS
320 | Freq: Every day | ORAL | Status: DC
Start: 2023-09-02 — End: 2023-09-02

## 2023-09-02 MED ORDER — NORMAL SALINE FLUSH 0.9 % IV SOLN
0.9 | Freq: Two times a day (BID) | INTRAVENOUS | Status: DC
Start: 2023-09-02 — End: 2023-09-05
  Administered 2023-09-03 – 2023-09-05 (×6): 10 mL via INTRAVENOUS

## 2023-09-02 MED ORDER — APIXABAN 5 MG PO TABS
5 | Freq: Two times a day (BID) | ORAL | Status: DC
Start: 2023-09-02 — End: 2023-09-05
  Administered 2023-09-03 – 2023-09-05 (×6): 5 mg via ORAL

## 2023-09-02 MED ORDER — CARVEDILOL 25 MG PO TABS
25 | Freq: Two times a day (BID) | ORAL | Status: DC
Start: 2023-09-02 — End: 2023-09-05
  Administered 2023-09-03 – 2023-09-05 (×5): 25 mg via ORAL

## 2023-09-02 MED ORDER — DIPHENHYDRAMINE HCL 25 MG PO TABS
25 | Freq: Once | ORAL | Status: AC
Start: 2023-09-02 — End: 2023-09-02
  Administered 2023-09-02: 18:00:00 50 mg via ORAL

## 2023-09-02 MED ORDER — LACTATED RINGERS IV BOLUS
Freq: Once | INTRAVENOUS | Status: AC
Start: 2023-09-02 — End: 2023-09-02
  Administered 2023-09-02: 18:00:00 500 mL via INTRAVENOUS

## 2023-09-02 MED ORDER — CEFEPIME HCL 2 GM/100ML IV SOLN
2 | Freq: Three times a day (TID) | INTRAVENOUS | Status: DC
Start: 2023-09-02 — End: 2023-09-04
  Administered 2023-09-03 – 2023-09-04 (×5): 2000 mg via INTRAVENOUS

## 2023-09-02 MED ORDER — ONDANSETRON 4 MG PO TBDP
4 | Freq: Three times a day (TID) | ORAL | Status: DC | PRN
Start: 2023-09-02 — End: 2023-09-05

## 2023-09-02 MED ORDER — ACETAMINOPHEN 325 MG PO TABS
325 | Freq: Four times a day (QID) | ORAL | Status: DC | PRN
Start: 2023-09-02 — End: 2023-09-05
  Administered 2023-09-02 – 2023-09-03 (×2): 650 mg via ORAL

## 2023-09-02 MED ORDER — LINEZOLID 600 MG/300ML IV SOLN
600 | Freq: Two times a day (BID) | INTRAVENOUS | Status: AC
Start: 2023-09-02 — End: 2023-09-05
  Administered 2023-09-03 – 2023-09-05 (×6): 600 mg via INTRAVENOUS

## 2023-09-02 MED ORDER — ONDANSETRON HCL 4 MG/2ML IJ SOLN
4 | Freq: Four times a day (QID) | INTRAMUSCULAR | Status: DC | PRN
Start: 2023-09-02 — End: 2023-09-05

## 2023-09-02 MED ORDER — IOPAMIDOL 76 % IV SOLN
76 | Freq: Once | INTRAVENOUS | Status: AC | PRN
Start: 2023-09-02 — End: 2023-09-02
  Administered 2023-09-02: 18:00:00 75 mL via INTRAVENOUS

## 2023-09-02 MED ORDER — MAGNESIUM SULFATE 2000 MG/50 ML IVPB PREMIX
2 | INTRAVENOUS | Status: DC | PRN
Start: 2023-09-02 — End: 2023-09-05

## 2023-09-02 MED ORDER — POTASSIUM CHLORIDE CRYS ER 20 MEQ PO TBCR
20 | ORAL | Status: AC | PRN
Start: 2023-09-02 — End: 2023-09-03

## 2023-09-02 MED ORDER — ALBUTEROL SULFATE (2.5 MG/3ML) 0.083% IN NEBU
Freq: Four times a day (QID) | RESPIRATORY_TRACT | Status: DC | PRN
Start: 2023-09-02 — End: 2023-09-02

## 2023-09-02 MED FILL — VALSARTAN 320 MG PO TABS: 320 MG | ORAL | Qty: 1

## 2023-09-02 MED FILL — TIZANIDINE HCL 4 MG PO TABS: 4 MG | ORAL | Qty: 1

## 2023-09-02 MED FILL — LABETALOL HCL 5 MG/ML IV SOLN: 5 MG/ML | INTRAVENOUS | Qty: 4

## 2023-09-02 MED FILL — BENADRYL ALLERGY ULTRATABS 25 MG PO TABS: 25 MG | ORAL | Qty: 2

## 2023-09-02 MED FILL — CEFEPIME HCL 2 GM/100ML IV SOLN: 2 GM/100ML | INTRAVENOUS | Qty: 100

## 2023-09-02 MED FILL — OXYCODONE HCL 5 MG PO TABS: 5 MG | ORAL | Qty: 1

## 2023-09-02 MED FILL — ACETAMINOPHEN 325 MG PO TABS: 325 MG | ORAL | Qty: 2

## 2023-09-02 NOTE — Care Coordination-Inpatient (Signed)
Case Management Assessment  Initial Evaluation    Date/Time of Evaluation: 09/02/2023 3:12 PM  Assessment Completed by: Danne Harbor, RN    If patient is discharged prior to next notation, then this note serves as note for discharge by case management.    Patient Name: Gabrielle Wells                   Date of Birth: [birth year]  Diagnosis: No admission diagnoses are documented for this encounter.                   Date / Time: 09/02/2023 12:01 PM    Patient Admission Status: Emergency   Readmission Risk (Low < 19, Mod (19-27), High > 27): No data recorded  Current PCP: Berton Lan, MD  PCP verified by CM? Yes    Chart Reviewed: Yes      History Provided by: Patient  Patient Orientation: Alert and Oriented, Person, Place, Situation    Patient Cognition: Alert    Hospitalization in the last 30 days (Readmission):  No    If yes, Readmission Assessment in CM Navigator will be completed.    Advance Directives:      Code Status: Prior   Patient's Primary Decision Maker is: Legal Next of Kin      Discharge Planning:    Patient lives with:   Type of Home:    Primary Care Giver: Self  Patient Support Systems include: Children, Family Members   Current Financial resources:    Current community resources:    Current services prior to admission:              Current DME:              Type of Home Care services:       ADLS  Prior functional level: Assistance with the following:, Shopping, Housework, Mobility  Current functional level: Mobility, Shopping, Housework, Assistance with the following:    PT AM-PAC:   /24  OT AM-PAC:   /24    Family can provide assistance at DC: No  Would you like Case Management to discuss the discharge plan with any other family members/significant others, and if so, who? Yes (daughter , brother, sil)  Plans to Return to Present Housing: Unknown at present  Other Identified Issues/Barriers to RETURNING to current housing: cough, headache.   Potential Assistance needed at discharge:               Potential DME:    Patient expects to discharge to:    Plan for transportation at discharge:      Financial    Payor: [employer] MEDICARE / Plan: Francine Graven GOLD PLUS HMO / Product Type: *No Product type* /     Does insurance require precert for SNF: Yes    Potential assistance Purchasing Medications:    Meds-to-Beds request:        [facility] - [place], [place] - [address] [place] [phone] - F [phone]  [address] [place] [address]  Phone: [phone] Fax: [phone]    [facility] - [place], [place] - [address] Demetrius Charity [phone] Carmon Ginsberg [phone]  [address]  [place] [address]  Phone: [phone] Fax: [phone]      Notes:    Factors facilitating achievement of predicted outcomes: Family support, Cooperative, Pleasant, and Has needed Durable Medical Equipment at home    Barriers to discharge: Limited family support, Decreased endurance, Medical  complications, and Medication managment    Additional Case Management Notes: CM met with patient at bedside in the ED. Pt is from home alone in a apt. Pt states she uses a walker to ambulate, has COA services 5 hours a week but would like some hours on the weekend if possible. Pt has a shower chair and gets MOW. PT states she does not drive and uses uber and lyft . Pt states she could use PT/OT at discharge to get stronger. Pt currently getting chemotherapy . Pt states her brother or sil can transport her at discharge. Pt states her daughter is also undergoing chemo and gets HD so she can not help out as much as she would like. CM to continue to follow through hospital stay.     The Plan for Transition of Care is related to the following treatment goals of No admission diagnoses are documented for this encounter.    IF APPLICABLE: The Patient and/or patient representative Ella and her family were provided with a choice of provider and agrees with the discharge plan. Freedom of choice list with basic dialogue that supports  the patient's individualized plan of care/goals and shares the quality data associated with the providers was provided to:     Patient Representative Name:       The Patient and/or Patient Representative Agree with the Discharge Plan?      Danne Harbor, RN  Case Management Department  Ph: [phone] Fax: [phone]

## 2023-09-02 NOTE — ED Provider Notes (Signed)
THE [facility]  EMERGENCY DEPARTMENT ENCOUNTER          ATTENDING PHYSICIAN NOTE       Date of evaluation: 09/02/2023      Assessment/ Medical Decion Making     MDM:     ED Course as of 09/03/23 1027   Tue Sep 02, 2023   [id number] 73 year old female with history of malignancy on chemo presenting for evaluation of generalized weakness with recurrent falls, most recently today.  She is on anticoagulation.  On exam she notably has mild tachycardia, borderline temperature and she is hypertensive.  She has normal mental status and generalized weakness and diffuse tenderness.  No bony deformity.  With her anticoagulation and recurrent falls ordered CT of the head, total spine, chest abdomen and pelvis.  Also ordered labs for workup of generalized weakness and abnormal vitals.  EKG obtained and interpreted by me with sinus tachycardia, normal axis, low voltage, similar to prior.  No acute ST or T wave changes. [MM]      ED Course User Index  [MM] Randall An, MD     Workup without acute lab abnormalities. No acute traumatic injuries on imaging. She remains weak and with abnormal vitals. Spoke with her hematologist regarding admission and they advised admission to hospital medicine where they will follow along.          Franchot Mimes, MD  10:27 AM    Clinical Impression     1. General weakness    2. Recurrent falls        Disposition     DISPOSITION Admitted 09/02/2023 03:43:36 PM                 Chief Complaint     Fall (Patient with a chief complaint of a fall that she sustained last week, but had an additional fall today when she was getting out of bed.) and Dizziness (Patient with additional complain of feeling dizziness when she stands up.)      History of Present Illness     Gabrielle Wells is a 73 y.o. 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  She is anticoagulated with Eliquis.    Patient  initially fell last week while trying to get into her house.  She dropped her keys and bent over falling forward.  Did hit her head and briefly lose consciousness.  She is never able to get up after she falls because of back pain but was able to call out and get help.  States she did not seek care at that time and has just been mostly lying in the bed because she has been too weak to get up without assistance.  Is able to get to her recliner chair and aide helps prepare meals.  Has been having palpitations and feels generally weak.    She fell again today from her bed onto carpet.  Has primarily back pain related to this fall.  Did not lose consciousness.    She does not believe she has had any fevers but does not have a thermometer.  She denies any chest pain.  Has had some shortness of breath and cough today.  No vomiting, diarrhea, urinary symptoms.    Review of Systems     Pertinent positive and negative findings as documented in the HPI. Otherwise a complete ROS was performed and all other systems  were reviewed and were negative.    Physical Exam     INITIAL VITALS: BP: (!) 178/106, Temp: 100 F (37.8 C), Pulse: (!) 116, Respirations: 22, SpO2: 97 %     Nursing note and vitals reviewed.    Physical Exam  Constitutional:       General: She is not in acute distress.     Appearance: Normal appearance. She is not toxic-appearing.   HENT:      Head: Normocephalic.      Right Ear: External ear normal.      Left Ear: External ear normal.   Eyes:      General:         Right eye: No discharge.         Left eye: No discharge.      Extraocular Movements: Extraocular movements intact.   Cardiovascular:      Rate and Rhythm: Regular rhythm. Tachycardia present.   Pulmonary:      Effort: Pulmonary effort is normal. No respiratory distress.      Breath sounds: No wheezing or rales.      Comments: Occasional nonproductive cough during assessment.  Abdominal:      General: There is no distension.      Tenderness: There is  abdominal tenderness (mild, diffuse). There is no guarding.   Musculoskeletal:         General: No deformity.      Cervical back: Normal range of motion. No rigidity.      Right lower leg: No edema.      Left lower leg: No edema.      Comments: Diffuse tenderness throughout the spine, mild tenderness to the right flank and ribs.  Mild tenderness to the pelvis without instability.  No tenderness otherwise to the lower extremities.  Negative logroll of bilateral lower extremities.   Skin:     General: Skin is warm and dry.   Neurological:      Mental Status: She is alert. Mental status is at baseline.      Cranial Nerves: No cranial nerve deficit.      Comments: Normal mental status, no focal weakness or numbness.  4+ out of 5 power in all extremities consistent with report of generalized weakness.   Psychiatric:         Thought Content: Thought content normal.         Procedures   Procedures    Past Medical, Surgical, Family, and Social History     She has a past medical history of Adenocarcinoma of colon (HCC), Anesthesia, Arthritis, Asthma, CHF (congestive heart failure) (HCC), CKD (chronic kidney disease), Epidural abscess, GERD (gastroesophageal reflux disease), HTN (hypertension), Hyperlipidemia, IBS (irritable bowel syndrome), Incontinence of urine, Lumbar radiculopathy, Morbid obesity with BMI of 45.0-49.9, adult, Multiple allergies, Multiple myeloma (HCC), Muscle fasciculation, Neuropathic pain, Prolonged emergence from general anesthesia, SBO (small bowel obstruction) (HCC), Spinal fracture of T8 vertebra (HCC), Surgical site infection, Urinary incontinence, and Uses walker.  She has a past surgical history that includes Appendectomy; Breast lumpectomy (Bilateral); Hysterectomy, total abdominal; Cholecystectomy; Total knee arthroplasty (Bilateral, 2009); Cardiac catheterization (2012); Total shoulder arthroplasty w/ distal clavicle excision (Right, 2010); Abdominal hernia repair (2010); Colonoscopy; Upper  gastrointestinal endoscopy (N/A, 05/26/2020); Colonoscopy (05/26/2020); Colonoscopy (05/26/2020); CT BIOPSY BONE MARROW (01/03/2021); Abdomen surgery (N/A, 05/16/2021); XR MIDLINE EQUAL OR GREATER THAN 5 YEARS (07/28/2018); Ovary removal; Colonoscopy (N/A, 11/08/2022); Port Surgery (Left, 12/11/2022); and Colonoscopy (N/A, 12/16/2022).  Her family history includes Breast Cancer in her maternal  aunt; Cancer in her paternal grandmother.  She reports that she has never smoked. She has never used smokeless tobacco. She reports that she does not drink alcohol and does not use drugs.    Nursing Notes, Past Medical Hx, Past Surgical Hx, Social Hx,Allergies, and Family Hx were reviewed.    Medications     Current Discharge Medication List        CONTINUE these medications which have NOT CHANGED    Details   cyclobenzaprine (FLEXERIL) 5 MG tablet Take 1 tablet by mouth 3 times daily      oxyCODONE (ROXICODONE) 5 MG immediate release tablet Take 1 tablet by mouth every 4 hours as needed for Pain. Max Daily Amount: 30 mg      apixaban (ELIQUIS) 5 MG TABS tablet 2 tablets      acyclovir (ZOVIRAX) 400 MG tablet Take 1 tablet by mouth 2 times daily      predniSONE (DELTASONE) 50 MG tablet Take one tab 13 hours, 6 hours and 1 hour prior to CT Scan  Qty: 3 tablet, Refills: 0    Associated Diagnoses: Radiographic dye allergy status      carvedilol (COREG) 25 MG tablet TAKE ONE TABLET BY MOUTH TWICE DAILY  Qty: 180 tablet, Refills: 3      valsartan (DIOVAN) 320 MG tablet Take 1 tablet by mouth daily  Qty: 90 tablet, Refills: 3    Associated Diagnoses: Resistant hypertension      atorvastatin (LIPITOR) 40 MG tablet Take 1 tablet by mouth daily  Qty: 90 tablet, Refills: 1      Cholecalciferol (VITAMIN D3) 25 MCG TABS TAKE TWO TABLETS BY MOUTH DAILY  Qty: 60 tablet, Refills: 2    Associated Diagnoses: Vitamin D deficiency      albuterol sulfate HFA (VENTOLIN HFA) 108 (90 Base) MCG/ACT inhaler Inhale 2 puffs into the lungs 4 times daily as  needed for Wheezing  Qty: 18 g, Refills: 0    Associated Diagnoses: Dyspnea on exertion      ondansetron (ZOFRAN-ODT) 4 MG disintegrating tablet DISSOLVE 1 TABLET ON THE TONGUE EVERY 8 HOURS AS NEEDED FOR NAUSEA  Qty: 21 tablet, Refills: 0      acetaminophen (TYLENOL) 500 MG tablet Take 1 tablet by mouth every 6 hours as needed for Pain      lenalidomide (REVLIMID) 25 MG chemo capsule Take 1 capsule by mouth daily      furosemide (LASIX) 20 MG tablet Take 2 tablets by mouth daily Take 80 mg if wt goes up 3-5lbs  Qty: 30 tablet, Refills: 1    Associated Diagnoses: Chronic diastolic heart failure (HCC); Fluid retention      gabapentin (NEURONTIN) 300 MG capsule TAKE ONE CAPSULE BY MOUTH 3 TIMES DAILY  Qty: 90 capsule, Refills: 0    Associated Diagnoses: Spinal stenosis of thoracic region      omeprazole (PRILOSEC) 20 MG delayed release capsule Take 1 capsule by mouth every morning (before breakfast)  Qty: 90 capsule, Refills: 1    Associated Diagnoses: Gastroesophageal reflux disease, unspecified whether esophagitis present             Allergies     She is allergic to aspirin, ibuprofen, iodides, iodinated contrast media, mushroom, povidone iodine, shellfish allergy, sulfa antibiotics, sulfasalazine, vancomycin, amoxicillin-pot clavulanate, ciprofloxacin, coconut (cocos nucifera), codeine, egg-derived products, fish-derived products, iodine, metronidazole, soap & cleansers, and sulfacetamide.    ED Course     Patient was given the following medications:  Orders Placed This  Encounter   Medications    diphenhydrAMINE (BENADRYL) tablet 50 mg    lactated ringers bolus 500 mL    tiZANidine (ZANAFLEX) tablet 2 mg    iopamidol (ISOVUE-370) 76 % injection 75 mL    oxyCODONE (ROXICODONE) immediate release tablet 2.5 mg    valsartan (DIOVAN) tablet 320 mg    DISCONTD: albuterol (PROVENTIL) (2.5 MG/3ML) 0.083% nebulizer solution 2.5 mg     Order Specific Question:   Initiate RT Bronchodilator Protocol     Answer:   No     carvedilol (COREG) tablet 25 mg    DISCONTD: tiZANidine (ZANAFLEX) tablet 2 mg    sodium chloride flush 0.9 % injection 5-40 mL    sodium chloride flush 0.9 % injection 5-40 mL    0.9 % sodium chloride infusion    OR Linked Order Group     potassium chloride (KLOR-CON M) extended release tablet 40 mEq     potassium bicarb-citric acid (EFFER-K) effervescent tablet 40 mEq     potassium chloride 10 mEq/100 mL IVPB (Peripheral Line)    magnesium sulfate 2000 mg in 50 mL IVPB premix    OR Linked Order Group     ondansetron (ZOFRAN-ODT) disintegrating tablet 4 mg     ondansetron (ZOFRAN) injection 4 mg    polyethylene glycol (GLYCOLAX) packet 17 g    OR Linked Order Group     acetaminophen (TYLENOL) tablet 650 mg     acetaminophen (TYLENOL) suppository 650 mg    labetalol (NORMODYNE;TRANDATE) injection 10 mg    DISCONTD: valsartan (DIOVAN) tablet 320 mg    DISCONTD: ipratropium 0.5 mg-albuterol 2.5 mg (DUONEB) nebulizer solution 1 Dose     Order Specific Question:   Initiate RT Bronchodilator Protocol     Answer:   Yes - Inpatient Protocol    FOLLOWED BY Linked Order Group     cefepime (MAXIPIME) IVPB 2,000 mg      Order Specific Question:   Antimicrobial Indications      Answer:   Pneumonia (Nosocomial)      Order Specific Question:   Pnuemonia (Nosocomial) duration of therapy      Answer:   Other      Order Specific Question:   Other Pneumonia (Nosocomial) Duration      Answer:   1 dose     cefepime (MAXIPIME) IVPB 2,000 mg      Order Specific Question:   Antimicrobial Indications      Answer:   Pneumonia (Nosocomial)      Order Specific Question:   Pnuemonia (Nosocomial) duration of therapy      Answer:   Other      Order Specific Question:   Other Pneumonia (Nosocomial) Duration      Answer:   3 days    apixaban (ELIQUIS) tablet 5 mg     Order Specific Question:   Indication of Use     Answer:   Treatment-DVT/PE    linezolid (ZYVOX) IVPB 600 mg     Order Specific Question:   Antimicrobial Indications     Answer:    Pneumonia (Nosocomial)     Order Specific Question:   Pnuemonia (Nosocomial) duration of therapy     Answer:   Other     Order Specific Question:   Other Pneumonia (Nosocomial) Duration     Answer:   3 days     Order Specific Question:   Suspected Organism(s)     Answer:   On chemotherapy    valsartan (  DIOVAN) tablet 320 mg    Benzocaine-Menthol (CEPACOL) 1 lozenge    ipratropium 0.5 mg-albuterol 2.5 mg (DUONEB) nebulizer solution 1 Dose     Order Specific Question:   Initiate RT Bronchodilator Protocol     Answer:   Yes - Inpatient Protocol    albuterol (PROVENTIL) (2.5 MG/3ML) 0.083% nebulizer solution 2.5 mg     Order Specific Question:   Initiate RT Bronchodilator Protocol     Answer:   No    guaiFENesin (MUCINEX) extended release tablet 600 mg       No results found.    RECENT VITALS:  BP: 119/63,Temp: 98.2 F (36.8 C), Pulse: 85, Respirations: 25, SpO2: 94 %     RADIOLOGY:  CT LUMBAR RECONSTRUCTION WO POST PROCESS   Final Result      No acute fracture identified in the lumbar spine.      Electronically signed by Renato Battles      CT THORACIC RECONSTRUCTION WO POST PROCESS   Final Result      No acute fracture identified in the thoracic spine.      Electronically signed by Renato Battles      CT CHEST ABDOMEN PELVIS W CONTRAST Additional Contrast? None   Final Result         CHEST:      1.  Motion compromised study.   2.  Mild bibasilar airspace disease/atelectasis.   3.  No other convincing acute abnormality seen.      ABDOMEN AND PELVIS:      1.  Motion compromised study.   2.  No convincing acute abnormality seen.      Electronically signed by Renato Battles      CT CERVICAL SPINE WO CONTRAST   Final Result      No evidence of cervical spine fracture.         Electronically signed by Renato Battles      CT HEAD WO CONTRAST   Final Result      No acute intracranial hemorrhage or mass effect.      Electronically signed by Renato Battles      XR HUMERUS RIGHT (MIN 2 VIEWS)   Final Result      1.  Right humeral arthroplasty.  Associated mild to moderate right shoulder deformity likely related to degenerative process, possible rotator cuff abnormality.   2.  No evidence of acute fracture.      Electronically signed by MD Vallarie Mare          LABS:   Results for orders placed or performed during the hospital encounter of 09/02/23   COVID-19 & Influenza Combo    Specimen: Nasopharyngeal Swab   Result Value Ref Range    SARS-CoV-2 RNA, RT PCR NOT DETECTED NOT DETECTED    Influenza A NOT DETECTED NOT DETECTED    Influenza B NOT DETECTED NOT DETECTED   CBC with Auto Differential   Result Value Ref Range    WBC 6.6 4.0 - 11.0 K/uL    RBC 5.31 (H) 4.00 - 5.20 M/uL    Hemoglobin 15.2 12.0 - 16.0 g/dL    Hematocrit 33.2 95.1 - 48.0 %    MCV 88.4 80.0 - 100.0 fL    MCH 28.6 26.0 - 34.0 pg    MCHC 32.4 31.0 - 36.0 g/dL    RDW 88.4 16.6 - 06.3 %    Platelets 87 (L) 135 - 450 K/uL    MPV 10.1 5.0 - 10.5 fL  PLATELET SLIDE REVIEW Decreased     Neutrophils % 60.0 %    Lymphocytes % 17.0 %    Monocytes % 23.0 %    Eosinophils % 0.0 %    Basophils % 0.0 %    Neutrophils Absolute 4.0 1.7 - 7.7 K/uL    Lymphocytes Absolute 1.1 1.0 - 5.1 K/uL    Monocytes Absolute 1.5 (H) 0.0 - 1.3 K/uL    Eosinophils Absolute 0.0 0.0 - 0.6 K/uL    Basophils Absolute 0.0 0.0 - 0.2 K/uL    Ovalocytes 1+ (A)     Tear Drop Cells Occasional (A)    BMP w/ Reflex to MG   Result Value Ref Range    Sodium 139 136 - 145 mmol/L    Potassium reflex Magnesium 4.0 3.5 - 5.1 mmol/L    Chloride 104 99 - 110 mmol/L    CO2 23 21 - 32 mmol/L    Anion Gap 12 3 - 16    Glucose 138 (H) 70 - 99 mg/dL    BUN 7 7 - 20 mg/dL    Creatinine 0.9 0.6 - 1.2 mg/dL    Est, Glom Filt Rate 67 >60    Calcium 9.6 8.3 - 10.6 mg/dL   Lactate, Sepsis   Result Value Ref Range    Lactic Acid, Sepsis 1.7 0.4 - 1.9 mmol/L   Troponin   Result Value Ref Range    Troponin, High Sensitivity 13 0 - 14 ng/L   Troponin   Result Value Ref Range    Troponin, High Sensitivity 15 (H) 0 - 14 ng/L   BNP   Result Value Ref  Range    NT Pro-BNP 420 0 - 449 pg/mL   Urinalysis with Reflex to Culture    Specimen: Urine   Result Value Ref Range    Color, UA Yellow Straw/Yellow    Clarity, UA Clear Clear    Glucose, Ur Negative Negative mg/dL    Bilirubin, Urine Negative Negative    Ketones, Urine Negative Negative mg/dL    Specific Gravity, UA 1.020 1.005 - 1.030    Blood, Urine Negative Negative    pH, Urine 7.5 5.0 - 8.0    Protein, UA Negative Negative mg/dL    Urobilinogen, Urine 0.2 <2.0 E.U./dL    Nitrite, Urine Negative Negative    Leukocyte Esterase, Urine Negative Negative    Microscopic Examination Not Indicated     Urine Type NotGiven     Urine Reflex to Culture Not Indicated    Anti-XA, Heparin   Result Value Ref Range    Anti-XA Unfrac Heparin 0.12 (L) 0.30 - 0.70 IU/mL   CBC with Auto Differential   Result Value Ref Range    WBC 8.4 4.0 - 11.0 K/uL    RBC 4.78 4.00 - 5.20 M/uL    Hemoglobin 13.9 12.0 - 16.0 g/dL    Hematocrit 91.4 78.2 - 48.0 %    MCV 88.3 80.0 - 100.0 fL    MCH 29.1 26.0 - 34.0 pg    MCHC 33.0 31.0 - 36.0 g/dL    RDW 95.6 21.3 - 08.6 %    Platelets 76 (L) 135 - 450 K/uL    MPV 10.7 (H) 5.0 - 10.5 fL    PLATELET SLIDE REVIEW Adequate     SLIDE REVIEW see below     Path Consult No     Neutrophils % 66.0 %    Lymphocytes % 6.0 %    Monocytes %  18.0 %    Eosinophils % 0.0 %    Basophils % 0.0 %    Neutrophils Absolute 6.0 1.7 - 7.7 K/uL    Lymphocytes Absolute 0.8 (L) 1.0 - 5.1 K/uL    Monocytes Absolute 1.5 (H) 0.0 - 1.3 K/uL    Eosinophils Absolute 0.0 0.0 - 0.6 K/uL    Basophils Absolute 0.0 0.0 - 0.2 K/uL    Bands Relative 4 0 - 7 %    Atypical Lymphocytes Relative 4 0 - 6 %    Metamyelocytes Relative 2 (A) %    Smudge Cells Present (A)     RBC Morphology Normal    Procalcitonin   Result Value Ref Range    Procalcitonin 0.52 (H) 0.00 - 0.15 ng/mL   EKG 12 Lead   Result Value Ref Range    Ventricular Rate 115 BPM    Atrial Rate 115 BPM    P-R Interval 154 ms    QRS Duration 68 ms    Q-T Interval 290 ms    QTc  Calculation (Bazett) 401 ms    P Axis -15 degrees    R Axis 20 degrees    T Axis -34 degrees    Diagnosis       Sinus tachycardiaLow voltage QRSCannot rule out Anterior infarct , age undeterminedAbnormal ECGEKG performed in ER and to be interpreted by ER physician.Confirmed by MD, ER ([id number]), editor MORGAN, NICKIA [phone]) on 09/02/2023 12:30:26 PM       CONSULTS:  IP CONSULT TO HEMATOLOGY    PATIENT REFERRED TO:  No follow-up provider specified.    DISCHARGE MEDICATIONS:  Current Discharge Medication List             Randall An, MD  09/03/23 1027

## 2023-09-02 NOTE — Plan of Care (Incomplete)
Problem: Pain  Goal: Verbalizes/displays adequate comfort level or baseline comfort level  Outcome: Progressing     Problem: ABCDS Injury Assessment  Goal: Absence of physical injury  Outcome: Progressing     Problem: Safety - Adult  Goal: Free from fall injury  Outcome: Progressing

## 2023-09-02 NOTE — H&P (Signed)
V2.0  History and Physical      Name:  Gabrielle Wells DOB/Age/Sex: 11-12-1950  (73 y.o. female)   MRN & CSN:  [id number] & [id number] Encounter Date/Time: 09/02/2023 3:43 PM EST   Location:  B21/B21-21 PCP: Berton Lan, MD       Hospital Day: 1    Assessment and Plan:   Gabrielle Wells is a 73 y.o. female with a pmh of MGUS, essential hypertension, GERD, CKD, chronic thrombocytopenia, hyperlipidemia, vitamin D deficiency, chronic pain disorder, morbid obesity who presents with extreme fatigue, severe generalized weakness and worsening cough    Hospital Problems             Last Modified POA    * (Principal) Weakness generalized 09/02/2023 Yes   #Significant generalized weakness  #Frequent falls  -Electrolytes within normal limits  -UA not consistent with infection  -Will hold statins for now until patient recovers  -PT/OT  -Fall precautions    #SIRS criteria for sepsis (initial temperature of 100 F, patient later developed a fever of 102.3 F, tachycardia)  #Worsening cough over the past 2 weeks  #Suspected HCAP  #Abnormal lung auscultation  -Patient is on chemotherapy once a month, last session a week ago  -CT chest with bibasilar airspace disease  -Lung auscultation with coarse breath sounds bilaterally with bibasilar crackles  -Influenza and COVID-19 PCR-negative  -Given immunocompromised status and worsening cough we will start on broad-spectrum antibiotics after obtaining blood cultures x 2  -Patient has documented allergy to vancomycin  -Will start on linezolid and cefepime, bronchodilator therapy, symptomatic therapy    #MGUS  -Oncology consulted  -On chemotherapy once a month, last session-a week ago    #Hypertensive urgency  -Has history of resistant hypertension  -Increased patient's Coreg dose from 12.5 to 25 mg twice daily  -Continue on home doses of losartan  -IV antihypertensives as needed    #Elevated high-sensitivity troponin, trending up-likely secondary to elevated blood pressure  -Patient  denies chest pain or SOB except when coughing  -EKG with no acute ischemic changes    #Chronic anticoagulation with Eliquis for prior history of pulmonary embolism  #Chronic thrombocytopenia-likely secondary to chemotherapy  -Monitor CBC closely    # Severe obesity with BMI of 42        Disposition:   Current Living situation: Home  Expected Disposition: To be decided  Estimated D/C: To be decided    Diet ADULT DIET; Regular   DVT Prophylaxis []  Lovenox, []   Heparin, []  SCDs, []  Ambulation,  [x]  Eliquis, []  Xarelto, []  Coumadin   Code Status Full   Surrogate Decision Maker/ POA Sister/brother     Personally reviewed Lab Studies CBC, BMP, lactic acid levels, high-sensitivity troponin, influenza and COVID-19 PCR, UA reflex to culture and Imaging     Discussed management of the case with ED provider who recommended admission for further management    EKG interpreted personally and results sinus tachycardia, VR = 115, QTc = 401, age undetermined anterior infarct, no acute ST-T changes    Imaging that was interpreted personally includes CT chest abdomen and pelvis with IV contrast and results poor studies due to motion, bibasilar airspace disease, no acute abdominal pathology    History from:     patient    History of Present Illness:     Chief Complaint: Severe fatigue and generalized weakness  Gabrielle Wells is a 73 y.o. female with pmh as mentioned above presents with complaints of extreme generalized  weakness and frequent falls for at least past 2 weeks.  Patient is unable to ambulate currently.  Initially patient had a fever of 100 F, later on she developed high-grade fever  Patient has hoarse voice due to worsening cough over the past 2 weeks.  Denies SOB or chest pain.  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  Receives once every month and last dose is about a week ago.  Patient's blood pressure was significantly elevated on admission and was tachycardic as well  Denies weight gain, leg swelling,  orthopnea      Pertinent positives and negatives discussed in HPI     Objective:   No intake or output data in the 24 hours ending 09/02/23 1543   Vitals:   Vitals:    09/02/23 1255 09/02/23 1315 09/02/23 1415 09/02/23 1445   BP: (!) 211/166      Pulse: (!) 116 (!) 107 95 98   Resp: 26 (!) 31 29 30    Temp:       TempSrc:       SpO2:  100% 98% 97%   Weight:       Height:           Personally Reviewed Medications Prior to Admission     Prior to Admission medications    Medication Sig Start Date End Date Taking? Authorizing Provider   apixaban (ELIQUIS) 5 MG TABS tablet 2 tablets 12/28/22   [provider]   acyclovir (ZOVIRAX) 400 MG tablet Take 1 tablet by mouth 2 times daily    [provider]   lenalidomide (REVLIMID) 25 MG chemo capsule Take 1 capsule by mouth daily    [provider]   predniSONE (DELTASONE) 50 MG tablet Take one tab 13 hours, 6 hours and 1 hour prior to CT Scan 11/28/22   Kizzie Furnish, MD   furosemide (LASIX) 20 MG tablet Take 2 tablets by mouth daily Take 80 mg if wt goes up 3-5lbs  Patient taking differently: Take 1 tablet by mouth as needed Take 80 mg if wt goes up 3-5lbs 06/17/22 07/29/23  Tindni, Arshdeep, MD   gabapentin (NEURONTIN) 300 MG capsule TAKE ONE CAPSULE BY MOUTH 3 TIMES DAILY 05/09/22 05/26/23  Barbee Cough, MD   carvedilol (COREG) 25 MG tablet TAKE ONE TABLET BY MOUTH TWICE DAILY  Patient taking differently: Take 0.5 tablets by mouth 2 times daily (with meals) 01/09/22   Laurette Schimke, MD   valsartan (DIOVAN) 320 MG tablet Take 1 tablet by mouth daily 12/19/21 07/29/23  Laurette Schimke, MD   atorvastatin (LIPITOR) 40 MG tablet Take 1 tablet by mouth daily 12/18/21   Cato Mulligan, DO   Cholecalciferol (VITAMIN D3) 25 MCG TABS TAKE TWO TABLETS BY MOUTH DAILY  Patient taking differently: Once a week at 5 PM TAKE TWO TABLETS BY MOUTH DAILY 12/18/21   Cherlyn Roberts H, DO   albuterol sulfate HFA (VENTOLIN HFA) 108 (90 Base) MCG/ACT inhaler Inhale 2 puffs  into the lungs 4 times daily as needed for Wheezing 07/16/21   Roxy Manns, MD   tiZANidine (ZANAFLEX) 2 MG tablet Take 1 tablet by mouth nightly as needed (muscle spasms) 07/16/21   Roxy Manns, MD   ondansetron (ZOFRAN-ODT) 4 MG disintegrating tablet DISSOLVE 1 TABLET ON THE TONGUE EVERY 8 HOURS AS NEEDED FOR NAUSEA 07/16/21   Roxy Manns, MD   omeprazole (PRILOSEC) 20 MG delayed release capsule Take 1 capsule by mouth every morning (before breakfast)  11/28/20   Margarito Liner, MD   acetaminophen (TYLENOL) 500 MG tablet Take 1 tablet by mouth every 6 hours as needed for Pain    [provider]       Physical Exam:        General: In moderate distress, severely obese, ill-appearing.  Afebrile at the time of this exam  Eyes: EOMI  ENT: neck supple  Cardiovascular: Regular rhythm, tachycardic rate.  Respiratory: Coarse breath sounds diffusely and crackles-bibasilar, right more than the left  Gastrointestinal: Soft, non tender  Genitourinary: no suprapubic tenderness  Musculoskeletal: No edema  Skin: warm, dry  Neuro: Alert.  Awake.  Oriented x 4  Psych: Mood appropriate.       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    s/i I&D and removaal of hardware    GERD (gastroesophageal reflux disease)     HTN (hypertension)     Hyperlipidemia     IBS (irritable bowel syndrome)     Incontinence of urine     Lumbar radiculopathy     Morbid obesity with BMI of 45.0-49.9, adult     Multiple allergies     Multiple myeloma (HCC)     Muscle fasciculation     Neuropathic pain     back 2/2 to spinal surgeries    Prolonged emergence from general anesthesia     SBO (small bowel obstruction) (HCC) 07/1999    with partial colectomy    Spinal fracture of  T8 vertebra (HCC)     s/p fusion     Surgical site infection     back, spinal hardware     Urinary incontinence     Uses walker      PSHX:  has a past surgical history that includes Appendectomy; Breast lumpectomy (Bilateral); Hysterectomy, total abdominal; Cholecystectomy; Total knee arthroplasty (Bilateral, 2009); Cardiac catheterization (2012); Total shoulder arthroplasty w/ distal clavicle excision (Right, 2010); Abdominal hernia repair (2010); Colonoscopy; Upper gastrointestinal endoscopy (N/A, 05/26/2020); Colonoscopy (05/26/2020); Colonoscopy (05/26/2020); CT BIOPSY BONE MARROW (01/03/2021); Abdomen surgery (N/A, 05/16/2021); XR MIDLINE EQUAL OR GREATER THAN 5 YEARS (07/28/2018); Ovary removal; Colonoscopy (N/A, 11/08/2022); Port Surgery (Left, 12/11/2022); and Colonoscopy (N/A, 12/16/2022).  Allergies:   Allergies   Allergen Reactions    Aspirin Other (See Comments)     Stomach bleeding    Ibuprofen Other (See Comments)     Stomach bleeding    Iodides Hives    Iodinated Contrast Media Hives and Other (See Comments)     Takes Benadryl 50mg  PO before receiving iodinated contrast    Mushroom Hives and Itching    Povidone Iodine Hives and Itching    Shellfish Allergy Hives and Itching    Sulfa Antibiotics Hives, Itching and Nausea And Vomiting    Sulfasalazine Hives and Itching    Vancomycin Other (See Comments)     Drug induced NEUTROPENIA    Amoxicillin-Pot Clavulanate Itching and Other (See Comments)     Has never had problems with amoxicillin/ penicillin; HAS TOLERATED ZOSYN WITH ZERO PROBLEMS    Ciprofloxacin Other (See Comments)     Made "bottom" raw  Coconut (Cocos Nucifera) Itching    Codeine Itching and Other (See Comments)    Egg-Derived Products Nausea And Vomiting    Fish-Derived Products Nausea And Vomiting    Iodine Itching    Metronidazole Diarrhea and Other (See Comments)     Made "bottom" raw    Soap & Cleansers Rash and Other (See Comments)     Detergents    Sulfacetamide Rash     Fam HX: family  history includes Breast Cancer in her maternal aunt; Cancer in her paternal grandmother.  Soc HX:   Social History     Socioeconomic History    Marital status: Single   Tobacco Use    Smoking status: Never    Smokeless tobacco: Never   Vaping Use    Vaping status: Never Used   Substance and Sexual Activity    Alcohol use: Never    Drug use: Never     Social Determinants of Health      Received from [facility] and [employer], Air traffic controller and Surveyor, mining Insecurities    Received from [employer] and [employer], Air traffic controller and Geologist, engineering    Received from [employer] and [employer], Air traffic controller and Tree surgeon    Received from [employer] and [employer], Air traffic controller and [employer]    Housing/Utilities       Medications:   Medications:    valsartan  320 mg Oral Once      Infusions: None  PRN Meds:  Refer to orders    Labs      CBC:   Recent Labs     09/02/23  1231   WBC 6.6   HGB 15.2   PLT 87*     BMP:    Recent Labs     09/02/23  1231   NA 139   K 4.0   CL 104   CO2 23   BUN 7   CREATININE 0.9   GLUCOSE 138*     Hepatic: No results for input(s): "AST", "ALT", "BILITOT", "ALKPHOS" in the last 72 hours.    Invalid input(s): "ALB"  Lipids:   Lab Results   Component Value Date/Time    CHOL 224 08/04/2019 02:17 PM    HDL 62 08/04/2019 02:17 PM    TRIG 124 08/04/2019 02:17 PM     Hemoglobin A1C:   Lab Results   Component Value Date/Time    LABA1C 5.7 08/04/2019 02:17 PM     TSH:   Lab Results   Component Value Date/Time    TSH 1.60 12/20/2020 10:59 AM     Troponin: No results found for: "TROPONINT"  Lactic Acid: No results for input(s): "LACTA" in the last 72 hours.  BNP:   Recent Labs     09/02/23  1231   PROBNP 420     UA:  Lab Results   Component Value Date/Time    NITRU Negative 09/02/2023 12:31 PM    COLORU Yellow 09/02/2023 12:31 PM    PHUR 7.5 09/02/2023  12:31 PM    PHUR 6.0 07/12/2022 05:05 PM    WBCUA 6-9 08/22/2023 09:09 PM    RBCUA 5-10 08/22/2023 09:09 PM    MUCUS 2+ 08/22/2023 09:09 PM    BACTERIA 4+ 08/22/2023 09:09 PM    CLARITYU Clear 09/02/2023 12:31 PM    LEUKOCYTESUR Negative 09/02/2023 12:31 PM    UROBILINOGEN 0.2 09/02/2023 12:31  PM    BILIRUBINUR Negative 09/02/2023 12:31 PM    BLOODU Negative 09/02/2023 12:31 PM    GLUCOSEU Negative 09/02/2023 12:31 PM    KETUA Negative 09/02/2023 12:31 PM    AMORPHOUS 3+ 07/12/2022 05:05 PM     Urine Cultures:   Lab Results   Component Value Date/Time    LABURIN No growth at 18 to 36 hours 04/23/2021 08:30 PM     Blood Cultures:   Lab Results   Component Value Date/Time    BC No Growth after 4 days of incubation. 12/26/2022 12:02 PM     No results found for: "BLOODCULT2"  Organism:   Lab Results   Component Value Date/Time    ORG Human Rhinovirus/Enterovirus by PCR 12/26/2022 12:14 PM       Imaging/Diagnostics Last 24 Hours   CT LUMBAR RECONSTRUCTION WO POST PROCESS    Result Date: 09/02/2023  CT LUMBAR RECONSTRUCTION WO POST PROCESS INDICATION: Fall. Back pain. COMPARISON: 07/12/2022 TECHNIQUE: Multiplanar CT images of the lumbar spine obtained from reconstructions of the abdomen and pelvis data set. Up-to-date CT equipment and radiation dose reduction techniques were employed. FINDINGS: Motion artifact somewhat compromises bone detail. Prior body fusion at L4-L5 and laminectomies at L4-L5 and L5-S1. At least partial bony fusion across posterior elements from about L3-S1. No vertebral compression deformity. No definite acute fracture. Round lucent lesion within the L4 vertebral body and S1 vertebral body remain unchanged from multiple prior studies. Severe multilevel degenerative changes. At least moderate if not severe canal stenosis at L2-L3. Moderate to severe left L1 foraminal stenosis. Moderate bilateral L2 foraminal stenosis. Severe right and mild to moderate left L3 foraminal stenosis. Mild right L4 foraminal  stenosis. Moderate right and severe left L5 foraminal stenosis.     No acute fracture identified in the lumbar spine. Electronically signed by Renato Battles    CT THORACIC RECONSTRUCTION WO POST PROCESS    Result Date: 09/02/2023  CT THORACIC RECONSTRUCTION WO POST PROCESS INDICATION: Fall. Back pain. COMPARISON: 12/27/2022 TECHNIQUE: Multiplanar CT images of the thoracic spine obtained from reconstruction of the CT chest data set. Up-to-date CT equipment and radiation dose reduction techniques were employed. FINDINGS: Osseous detail somewhat compromised by patient motion. Thoracic spine alignment is maintained. No vertebral compression deformity. No definite acute fracture. Paravertebral soft tissues are unremarkable. Multilevel degenerative changes. Prominent disc osteophyte complex causing moderate stenosis at T11-T12 variable neural foraminal stenosis, and for example, severe bilateral T10 foraminal stenosis, and right and moderate left T11 foraminal stenosis.     No acute fracture identified in the thoracic spine. Electronically signed by Renato Battles    CT CHEST ABDOMEN PELVIS W CONTRAST Additional Contrast? None    Result Date: 09/02/2023  CT CHEST ABDOMEN PELVIS W CONTRAST INDICATION: Fall. Chest pain. Abdominal pain. COMPARISON: 06/30/2023 TECHNIQUE:  CT of the chest, abdomen and pelvis was performed with intravenous contrast. Coronal and sagittal reconstructions were created. Up-to-date CT equipment and radiation dose reduction techniques were employed. IV Contrast: 75 mL Isovue-370 FINDINGS: CHEST: LUNGS AND AIRWAYS: Respiratory motion compromises detail of the lungs. Central airways are patent. Mild airspace disease/atelectasis in the basal lower lobes bilaterally. PLEURA: No effusion or significant pleural thickening. MEDIASTINUM: Heart is borderline in size. No pericardial effusion. Normal caliber aorta. Atherosclerosis in the aorta and coronary arteries. Right IJ chest port tip at cavoatrial junction no  lymphadenopathy. LYMPH NODES: No significant lymphadenopathy CHEST WALL/LOWER NECK: No significant findings identified. BONES: The thoracic spine is separately reported. No acute findings identified.  ABDOMEN AND PELVIS: LIVER: Grossly normal within limits of motion and streak artifact. GALLBLADDER AND BILIARY TREE: Gallbladder not seen, presumed surgically absent. No significant ductal dilation. PANCREAS: Grossly normal within limits of motion and streak artifacts. SPLEEN: Grossly normal. ADRENALS: Grossly normal. KIDNEYS: No hydronephrosis. Cyst in the right kidney which does not specifically warrant follow-up. BLADDER: Normal. REPRODUCTIVE ORGANS: Prior hysterectomy. BOWEL: Normal caliber. Diverticulosis in the colon. LYMPH NODES: No significant lymphadenopathy. PERITONEUM: No free air or free fluid VESSELS: Normal caliber aorta. ABDOMINAL WALL: Post ventral abdominal wall repair with mesh. BONES: The lumbar spine is separately reported. No acute findings identified.     CHEST: 1.  Motion compromised study. 2.  Mild bibasilar airspace disease/atelectasis. 3.  No other convincing acute abnormality seen. ABDOMEN AND PELVIS: 1.  Motion compromised study. 2.  No convincing acute abnormality seen. Electronically signed by Renato Battles    CT CERVICAL SPINE WO CONTRAST    Result Date: 09/02/2023  CT CERVICAL SPINE WO CONTRAST INDICATION: Fall. Neck pain. COMPARISON: 08/22/2023 TECHNIQUE: Contiguous axial CT was performed of the cervical spine. Coronal and sagittal reformations were created. Up-to-date CT equipment and radiation dose reduction techniques were employed. FINDINGS: Bone detail is somewhat limited by patient motion and body habitus. 1 mm grade 1 nausea stasis of see foreign C5. Reversal of normal cervical lordosis. No significant prevertebral swelling. The craniocervical junction and atlantoaxial joint appear intact. No evidence of cervical spine fracture. Multilevel degenerative disease and degenerative disease  unchanged from recent comparison.     No evidence of cervical spine fracture. Electronically signed by Renato Battles    CT HEAD WO CONTRAST    Result Date: 09/02/2023  CT HEAD WO CONTRAST INDICATION: Headache after fall. COMPARISON: 08/22/2023 TECHNIQUE:  CT head was performed without intravenous contrast. Coronal and sagittal reformations were created. Up-to-date CT equipment and radiation dose reduction techniques were employed. FINDINGS: No acute intracranial hemorrhage or extra-axial fluid collection. No mass effect or midline shift. Gray-white matter differentiation is maintained. Mild nonspecific periventricular and deep white matter hypoattenuation compatible with chronic small vessel ischemia. Ventricles are normal in size for age. Basal cisterns are patent. Calvarium is intact. Visualized paranasal sinuses and mastoid air cells are clear.     No acute intracranial hemorrhage or mass effect. Electronically signed by Renato Battles    XR HUMERUS RIGHT (MIN 2 VIEWS)    Result Date: 09/02/2023  INDICATION: Trauma. Pain. RIGHT HUMERUS 2 views were performed. No comparison. Bones appear mildly osteopenic. Evidence of right humeral proximal arthroplasty. No evidence of acute fracture. No dislocation identified. Narrowed glenohumeral space identified a moderate narrowing of the acromiohumeral space noted with mild to moderate hypertrophy of the acromioclavicular joint. No pathologic calcification identified. Soft tissues are unremarkable.     1.  Right humeral arthroplasty. Associated mild to moderate right shoulder deformity likely related to degenerative process, possible rotator cuff abnormality. 2.  No evidence of acute fracture. Electronically signed by MD Vallarie Mare        Electronically signed by Anselm Lis, MD on 09/02/2023 at 3:43 PM

## 2023-09-02 NOTE — ED Notes (Signed)
Patient Name: Gabrielle Wells  DOB: 1950-10-19 73 y.o.  MRN: [id number]  ED Room #: B21/B21-21     Chief complaint:   Chief Complaint   Patient presents with    Fall     Patient with a chief complaint of a fall that she sustained last week, but had an additional fall today when she was getting out of bed.    Dizziness     Patient with additional complain of feeling dizziness when she stands up.     Hospital Problem/Diagnosis:   Hospital Problems             Last Modified POA    * (Principal) Weakness generalized 09/02/2023 Yes         O2 Flow Rate:O2 Device: None (Room air)   (if applicable)  Cardiac Rhythm:   (if applicable)  Active LDA's:           How does patient ambulate?  Uses walker at home but is high fall risk    2. How does patient take pills? Whole with Water    3. Is patient alert? Alert    4. Is patient oriented? To Person, To Place, To Time, and To Situation    5.   Patient arrived from:  home  Facility Name: ___________________________________________    6. If patient is disoriented or from a Skill Nursing Facility has family been notified of admission? Yes    7. Patient belongings? Belongings: Cell Phone    Disposition of belongings? Kept with Patient     8. Any specific patient or family belongings/needs/dynamics?   a.     9. Miscellaneous comments/pending orders?  a. Pt coming from home for frequent falls and lower extremity weakness. Has chronic pain in back and with movement. A&Ox4. On RA. On purewick. Pt was unable to ambulate in ED. Port accessed currently receiving chemo tx for mult myeloma.      If there are any additional questions please reach out to the Emergency Department.      Johna Sheriff, RN  09/02/23 1620

## 2023-09-02 NOTE — Progress Notes (Signed)
4 Eyes Skin Assessment     NAME:  Gabrielle Wells  DATE OF BIRTH:  31-Aug-1950  MEDICAL RECORD NUMBER:  [id number]    The patient is being assessed for  Admission    I agree that at least one RN has performed a thorough Head to Toe Skin Assessment on the patient. ALL assessment sites listed below have been assessed.      Areas assessed by both nurses:    Head, Face, Ears, Shoulders, Back, Chest, Arms, Elbows, Hands, Sacrum. Buttock, Coccyx, Ischium, and Legs. Feet and Heels        Does the Patient have a Wound? Surgical scars       Braden Prevention initiated by RN: No  Wound Care Orders initiated by RN: No    Pressure Injury (Stage 3,4, Unstageable, DTI, NWPT, and Complex wounds) if present, place Wound referral order by RN under ORDER ENTRY: No    New Ostomies, if present place, Ostomy referral order under ORDER ENTRY: No     Nurse 1 eSignature: Electronically signed by Delaney Meigs, RN on 09/02/23 at 6:22 PM EST    **SHARE this note so that the co-signing nurse can place an eSignature**    Nurse 2 eSignature: Electronically signed by Cala Bradford, RN on 09/02/23 at 7:24 PM EST

## 2023-09-02 NOTE — Progress Notes (Signed)
Patient admitted to [facility] via stretcher from ED for diagnosis of multiple myeloma.    Patient and familyoriented to unit policies and procedures including: pain management practices, unit safety precautions, family rapid response, q4h vital signs and assessments, daily 4am lab draws, weekly chest x-rays, weekly VRE rectal swabs for surveillance, daily chlorhexidine bathing, standing transfusion orders, and routine central line care.  Also discussed use of call light and how to get in touch with nursing staff.  Stressed the importance of calling out immediately for any changes in condition including but not limited to: pain, chills, fever, nausea, vomiting, diarrhea, chest pain, sob/doe, assistance with toileting, bleeding, or any other symptoms that are out of the ordinary for the patient.  Patient verbalizes understanding of all instructions and will call for assistance as needed.

## 2023-09-02 NOTE — Other (Signed)
RT Nebulizer Bronchodilator Protocol Note    There is a bronchodilator order in the chart from a provider indicating to follow the RT Bronchodilator Protocol and there is an "Initiate RT Bronchodilator Protocol" order as well (see protocol at bottom of note).    CXR Findings:  No results found.    The findings from the last RT Protocol Assessment were as follows:  Smoking: Chronic pulmonary disease  Respiratory Pattern: Regular pattern and RR 12-20 bpm  Breath Sounds: Slightly diminished and/or crackles  Cough: Strong, productive  Indication for Bronchodilator Therapy:    Bronchodilator Assessment Score: 5    Aerosolized bronchodilator medication orders have been revised according to the RT Nebulizer Bronchodilator Protocol below.    Respiratory Therapist to perform RT Therapy Protocol Assessment initially then follow the protocol.  Repeat RT Therapy Protocol Assessment PRN for score 0-3 or on second treatment, BID, and PRN for scores above 3.    No Indications - adjust the frequency to every 6 hours PRN wheezing or bronchospasm, if no treatments needed after 48 hours then discontinue using Per Protocol order mode.     If indication present, adjust the RT bronchodilator orders based on the Bronchodilator Assessment Score as indicated below.  If a patient is on this medication at home then do not decrease Frequency below that used at home.    0-3 - enter or revise RT bronchodilator order(s) to equivalent RT Bronchodilator order with Frequency of every 4 hours PRN for wheezing or increased work of breathing using Per Protocol order mode.       4-6 - enter or revise RT Bronchodilator order(s) to two equivalent RT bronchodilator orders with one order with BID Frequency and one order with Frequency of every 4 hours PRN wheezing or increased work of breathing using Per Protocol order mode.         7-10 - enter or revise RT Bronchodilator order(s) to two equivalent RT bronchodilator orders with one order with TID Frequency  and one order with Frequency of every 4 hours PRN wheezing or increased work of breathing using Per Protocol order mode.       11-13 - enter or revise RT Bronchodilator order(s) to one equivalent RT bronchodilator order with QID Frequency and an Albuterol order with Frequency of every 4 hours PRN wheezing or increased work of breathing using Per Protocol order mode.      Greater than 13 - enter or revise RT Bronchodilator order(s) to one equivalent RT bronchodilator order with every 4 hours Frequency and an Albuterol order with Frequency of every 2 hours PRN wheezing or increased work of breathing using Per Protocol order mode.     RT to enter RT Home Evaluation for COPD & MDI Assessment order using Per Protocol order mode.    Electronically signed by Durene Cal, RCP on 09/02/2023 at 9:17 PM

## 2023-09-03 ENCOUNTER — Encounter: Payer: MEDICARE | Attending: Speech-Language Pathologist | Primary: Family Medicine

## 2023-09-03 LAB — CBC WITH AUTO DIFFERENTIAL
Atypical Lymphocytes Relative: 4 % (ref 0–6)
Bands Relative: 4 % (ref 0–7)
Basophils %: 0 %
Basophils Absolute: 0 10*3/uL (ref 0.0–0.2)
Eosinophils %: 0 %
Eosinophils Absolute: 0 10*3/uL (ref 0.0–0.6)
Hematocrit: 42.2 % (ref 36.0–48.0)
Hemoglobin: 13.9 g/dL (ref 12.0–16.0)
Lymphocytes %: 6 %
Lymphocytes Absolute: 0.8 10*3/uL — ABNORMAL LOW (ref 1.0–5.1)
MCH: 29.1 pg (ref 26.0–34.0)
MCHC: 33 g/dL (ref 31.0–36.0)
MCV: 88.3 fL (ref 80.0–100.0)
MPV: 10.7 fL — ABNORMAL HIGH (ref 5.0–10.5)
Metamyelocytes Relative: 2 % — AB
Monocytes %: 18 %
Monocytes Absolute: 1.5 10*3/uL — ABNORMAL HIGH (ref 0.0–1.3)
Neutrophils %: 66 %
Neutrophils Absolute: 6 10*3/uL (ref 1.7–7.7)
PLATELET SLIDE REVIEW: ADEQUATE
Platelets: 76 10*3/uL — ABNORMAL LOW (ref 135–450)
RBC Morphology: NORMAL
RBC: 4.78 M/uL (ref 4.00–5.20)
RDW: 13.2 % (ref 12.4–15.4)
WBC: 8.4 10*3/uL (ref 4.0–11.0)

## 2023-09-03 LAB — PROCALCITONIN: Procalcitonin: 0.52 ng/mL — ABNORMAL HIGH (ref 0.00–0.15)

## 2023-09-03 MED ORDER — GUAIFENESIN ER 600 MG PO TB12
600 | Freq: Two times a day (BID) | ORAL | Status: DC
Start: 2023-09-03 — End: 2023-09-05
  Administered 2023-09-03 – 2023-09-05 (×5): 600 mg via ORAL

## 2023-09-03 MED ORDER — ALBUTEROL SULFATE (2.5 MG/3ML) 0.083% IN NEBU
RESPIRATORY_TRACT | Status: DC | PRN
Start: 2023-09-03 — End: 2023-09-05
  Administered 2023-09-03 – 2023-09-04 (×3): 2.5 mg via RESPIRATORY_TRACT

## 2023-09-03 MED ORDER — IPRATROPIUM-ALBUTEROL 0.5-2.5 (3) MG/3ML IN SOLN
0.5-2.5 | Freq: Three times a day (TID) | RESPIRATORY_TRACT | Status: DC
Start: 2023-09-03 — End: 2023-09-05
  Administered 2023-09-04 – 2023-09-05 (×7): 1 via RESPIRATORY_TRACT

## 2023-09-03 MED ORDER — HYDROCODONE-ACETAMINOPHEN 5-325 MG PO TABS
5-325 | ORAL | Status: DC | PRN
Start: 2023-09-03 — End: 2023-09-05

## 2023-09-03 MED ORDER — HYDROCODONE-ACETAMINOPHEN 5-325 MG PO TABS
5-325 | ORAL | Status: DC | PRN
Start: 2023-09-03 — End: 2023-09-05
  Administered 2023-09-03 – 2023-09-05 (×2): 1 via ORAL

## 2023-09-03 MED ORDER — IPRATROPIUM-ALBUTEROL 0.5-2.5 (3) MG/3ML IN SOLN
0.5-2.5 | Freq: Two times a day (BID) | RESPIRATORY_TRACT | Status: DC
Start: 2023-09-03 — End: 2023-09-03

## 2023-09-03 MED ORDER — MORPHINE SULFATE (PF) 2 MG/ML IV SOLN
2 | INTRAVENOUS | Status: DC | PRN
Start: 2023-09-03 — End: 2023-09-03

## 2023-09-03 MED ORDER — MORPHINE SULFATE 4 MG/ML IV SOLN
4 | INTRAVENOUS | Status: DC | PRN
Start: 2023-09-03 — End: 2023-09-03

## 2023-09-03 MED FILL — ZYVOX 600 MG/300ML IV SOLN: 600 MG/300ML | INTRAVENOUS | Qty: 300

## 2023-09-03 MED FILL — ACETAMINOPHEN 325 MG PO TABS: 325 MG | ORAL | Qty: 2

## 2023-09-03 MED FILL — CARVEDILOL 25 MG PO TABS: 25 MG | ORAL | Qty: 1

## 2023-09-03 MED FILL — HYDROCODONE-ACETAMINOPHEN 5-325 MG PO TABS: 5-325 MG | ORAL | Qty: 1

## 2023-09-03 MED FILL — ELIQUIS 5 MG PO TABS: 5 MG | ORAL | Qty: 1

## 2023-09-03 MED FILL — IPRATROPIUM-ALBUTEROL 0.5-2.5 (3) MG/3ML IN SOLN: 0.5-2.5 (3) MG/3ML | RESPIRATORY_TRACT | Qty: 3

## 2023-09-03 MED FILL — CEFEPIME HCL 2 GM/100ML IV SOLN: 2 GM/100ML | INTRAVENOUS | Qty: 100

## 2023-09-03 MED FILL — VALSARTAN 320 MG PO TABS: 320 MG | ORAL | Qty: 1

## 2023-09-03 MED FILL — MUCINEX 600 MG PO TB12: 600 MG | ORAL | Qty: 1

## 2023-09-03 MED FILL — ALBUTEROL SULFATE (2.5 MG/3ML) 0.083% IN NEBU: RESPIRATORY_TRACT | Qty: 3

## 2023-09-03 NOTE — Care Coordination-Inpatient (Addendum)
Addendum at 11:18am: Returned to pt's room and spoke with sister Kara Mead outside pt's room per pt permission as she was working with therapy. Emma expressing concern of pt returning home and the need for SNF. Explained earlier conversation and [facility] referral. Kara Mead also aware if needs SNF that any cancer treatment is on hold. Kara Mead states that pt's niece works at [employer] [phone]) as Ship broker of nursing and just talked with pt about it. Aware will return to pt's room and discuss recommendations, along with calling her once reviewed.     10:38am: Chart review completed.     Spoke with pt at bedside this AM. She confirmed she plans on returning home alone when able and is open to skilled home care. Offered a HHC list and she declined stating she didn't want a list. Discussed HHC verbally per her request and she stated referral to [facility]) and [facility] if [facility] unable to accept.     Discussed with RN plan of care and sister arriving later. RN states PT/OT is pending.     Spoke with Efraim Kaufmann, [facility] liaison via phone call who states able to accept if home and will follow for discharge needs.     If pt needs SNF, she will require precert    Pryor Ochoa MSW, LISW-S  Child psychotherapist for [employer] and [employer] [facility])  [employer]: [phone]

## 2023-09-03 NOTE — Plan of Care (Signed)
Problem: ABCDS Injury Assessment  Goal: Absence of physical injury  09/03/2023 0431 by Lerry Paterson, RN  Outcome: Progressing     Problem: Safety - Adult  Goal: Free from fall injury  09/03/2023 0431 by Lerry Paterson, RN  Outcome: Progressing  Pt is a High fall risk.   Explained fall risk precautions to pt and rationale behind their use to keep the patient safe. Belongings are in reach. Pt encouraged to notify staff for any and all assistance. Staff present in tx suite throughout entirety of pts treatment to monitor and protect from falls.     Problem: Chronic Conditions and Co-morbidities  Goal: Patient's chronic conditions and co-morbidity symptoms are monitored and maintained or improved  09/03/2023 0431 by Lerry Paterson, RN  Outcome: Progressing

## 2023-09-03 NOTE — Progress Notes (Signed)
Patient takes Gabapentin 300 mg 3 times daily at home. RN notified Jesusita Oka, NP.     NP ordered Gabapentin 300 mg 3 times daily (see MAR)

## 2023-09-03 NOTE — Progress Notes (Signed)
Hospital Medicine Progress Note      Date of Admission: 09/02/2023  Hospital Day: 2    Chief Admission Complaint:  generalized weakness, cough     Subjective:  feeling somewhat better, still feeling generally ill however and reporting productive cough of green sputum.     Presenting Admission History:       Gabrielle Wells is a 73 y.o. female with pmh as mentioned above presents with complaints of extreme generalized weakness and frequent falls for at least past 2 weeks.  Patient is unable to ambulate currently.  Initially patient had a fever of 100 F, later on she developed high-grade fever  Patient has hoarse voice due to worsening cough over the past 2 weeks.  Denies SOB or chest pain.  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  Receives once every month and last dose is about a week ago.  Patient's blood pressure was significantly elevated on admission and was tachycardic as well  Denies weight gain, leg swelling, orthopnea    Assessment/Plan:      Current Principal Problem:  Weakness generalized    #Significant generalized weakness  #Frequent falls  -Electrolytes within normal limits  -UA not consistent with infection  -Will hold statins for now until patient recovers  -PT/OT  -Fall precautions     #SIRS criteria for sepsis (initial temperature of 100 F, patient later developed a fever of 102.3 F, tachycardia)  #Worsening cough over the past 2 weeks  #Suspected HCAP  #Abnormal lung auscultation  -Patient is on chemotherapy once a month, last session a week ago  -CT chest with bibasilar airspace disease  -Lung auscultation with coarse breath sounds bilaterally with bibasilar crackles  -Influenza and COVID-19 PCR-negative  -Given immunocompromised status and worsening cough we will start on broad-spectrum antibiotics after obtaining blood cultures x 2  -Patient has documented allergy to vancomycin  -Will start on linezolid and cefepime, bronchodilator therapy, symptomatic therapy     #MGUS  -Oncology  consulted  -On chemotherapy once a month, last session-a week ago     #Hypertensive urgency  -Has history of resistant hypertension  -Increased patient's Coreg dose from 12.5 to 25 mg twice daily  -Continue on home doses of losartan  -IV antihypertensives as needed     #Elevated high-sensitivity troponin, trending up-likely secondary to elevated blood pressure  -Patient denies chest pain or SOB except when coughing  -EKG with no acute ischemic changes     #Chronic anticoagulation with Eliquis for prior history of pulmonary embolism  #Chronic thrombocytopenia-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  Respiratory: Clear to auscultation  Gastrointestinal: Soft, non tender  Genitourinary: no suprapubic tenderness  Musculoskeletal: No edema  Skin: warm, dry  Neuro: Alert.  Psych: Mood appropriate.     BP 99/76   Pulse 76   Temp 97.8 F (36.6 C) (Oral)   Resp 18   Ht 1.6 m (5\' 3" )   Wt 107.3 kg (236 lb 9.6 oz)   SpO2 93%   BMI 41.91 kg/m     Diet: ADULT DIET; Regular; Low Sodium (2 gm)  DVT Prophylaxis: [] PPx LMWH  [] SQ Heparin  [] IPC/SCDs  [] Eliquis  [] Xarelto  [] Coumadin  [] Other -      Code status: Full Code  PT/OT Eval Status:   []   NOT yet ordered  [] Ordered and Pending   [] Seen with Recommendations for:  [] Home independently  [] Home w/ assist  [] HHC  [] SNF  [] Acute Rehab    Anticipated Discharge Day/Date:  TBD    Anticipated Discharge Location: [] Home  [] HHC  [] SNF  [] Acute Rehab  [] ECF  [] LTAC  [] Hospice  [] Other -      Consults:      None      This patient has a high likelihood of being discharged tomorrow and is appropriate for A1 Discharge Unit in AM pending clinical course overnight: [] Yes   [] No    ------------------------------------------------------------------------------------------------------------------------------------------------------------------------    MDM - level 3  [x]  High (any 2):    A. Problems (any 1):  [x]  Acute/Chronic Illness/injury posing threat to life or bodily function: sepsis in immunocompromised patient   []  Severe exacerbation of chronic illness:    ---------------------------------------------------------------------  B. Risk of Treatment (any 1):   [x]  Drugs/treatments that requiring intensive monitoring for toxicity:    [x]  IV ABX requiring serial renal monitoring for nephrotoxicity:vanc/ and cefepime      []  IV Narcotic analgesia for adverse drug reaction  []  IV diuresis requiring serial monitoring for renal impairment and electrolyte derangements  []  Critical electrolyte abnormalities requiring IV replacement and close serial monitoring  []  Insulin - monitoring serial glucose measurements for hypoglycemia  []  Anticoagulation requiring serial monitoring of coagulation factors  []  Other -  []  Change in code status:    []  Decision to de-escalate care this DOS due to change in treatment goals:    []  Decision to escalate care to:   []  Major surgery/procedure with associated risk factors (any 1):     []  Elective procedure with patient risk factors:    []   Emergent procedure:  []  IV/IM controlled substances (any 1):   []  One-time Order including Drug Name/Route, Reason Ordered:    []  Scheduled Order including Drug Name/Route, Reason Ordered or Continued:    []  PRN Order including Drug Name/Route, Reason Ordered or Continued:   ----------------------------------------------------------------------  C. Data (any 2)  []  Discussion of Management with External Professional (any 1):   []  Discussed current management and discharge planning options with Case Management.   []  Discussed management of the case with:    []  Study interpreted by me (any 1):   []  Telemetry personally  reviewed and interpreted:     []  Imaging personally reviewed and interpreted, includes:    []  Data Review (3+ points):  []  Collateral history obtained from:    []  Consultant Note reviewed with note date, specialty, and summary (1 point each):  []  Studies Reviewed (1 point each):    []  Lab Tests Reviewed (1 point each):   []  Appropriate follow-up labs were ordered:    Medications:  Personally reviewed in detail in conjunction w/ labs as documented for evidence of drug toxicity.     Infusion Medications    sodium chloride       Scheduled Medications    guaiFENesin  600 mg Oral BID    carvedilol  25 mg Oral BID WC    sodium chloride flush  5-40 mL IntraVENous 2 times per day    cefepime  2,000 mg IntraVENous Q8H    apixaban  5 mg Oral BID    linezolid  600 mg IntraVENous Q12H    valsartan  320 mg Oral Daily    ipratropium 0.5 mg-albuterol 2.5 mg  1 Dose Inhalation BID RT     PRN Meds:  sodium chloride flush, sodium chloride, potassium chloride **OR** potassium alternative oral replacement **OR** potassium chloride, magnesium sulfate, ondansetron **OR** ondansetron, polyethylene glycol, acetaminophen **OR** acetaminophen, labetalol, Benzocaine-Menthol, albuterol     Labs:  Personally reviewed and interpreted for clinical significance.     Recent Labs     09/02/23  1231 09/03/23  0423   WBC 6.6 8.4   HGB 15.2 13.9   HCT 46.9 42.2   PLT 87* 76*     Recent Labs     09/02/23  1231   NA 139   K 4.0   CL 104   CO2 23   BUN 7   CREATININE 0.9   CALCIUM 9.6     Recent Labs     09/02/23  1231 09/02/23  1441   PROBNP 420  --    TROPHS 13 15*     No results for input(s): "LABA1C" in the last 72 hours.  No results for input(s): "AST", "ALT", "BILIDIR", "BILITOT", "ALKPHOS" in the last 72 hours.  No results for input(s): "INR", "LACTA", "TSH" in the last 72 hours.    Urine Cultures:   Lab Results   Component Value Date/Time    LABURIN No growth at 18 to 36 hours 04/23/2021 08:30 PM     Blood Cultures:   Lab Results   Component Value  Date/Time    BC No Growth after 4 days of incubation. 12/26/2022 12:02 PM     No results found for: "BLOODCULT2"  Organism:   Lab Results   Component Value Date/Time    ORG Human Rhinovirus/Enterovirus by PCR 12/26/2022 12:14 PM         Purvis Sheffield, MD

## 2023-09-03 NOTE — Care Coordination-Inpatient (Signed)
SNF recs noted. Spoke with pt at bedside at length regarding the SNF recommendations and how she would benefit from a SNF as she would get more therapy at a SNF than HHC. Also discussed her living alone and the SNF would be short term.     She is concerned about her chemo. Explained she may be at the SNF during the off time of her chemo.     Also discussed the SNF where her niece works which could be beneficial to have family at the SNF.     She doesn't want a SNF at this time. Encouraged her to think about it and she stated agreement. She was agreeable to Clinical research associate calling her sister Kara Mead.    Called and spoke with sister Kara Mead. Updated her on the above and barriers of pt not being agreeable so a SNF referral can't be made at this time. Kara Mead said they will come discuss more with pt this evening.     SW will follow up tomorrow about SNF. Pt requires precert for SNF.     Pryor Ochoa MSW, LISW-S  Child psychotherapist for [employer] and Journalist, newspaper)  [employer]: [phone]

## 2023-09-03 NOTE — Progress Notes (Signed)
Physical Therapy  Facility/Department: [facility]  Physical Therapy Initial Assessment and Treatment    Name: Gabrielle Wells  DOB: 1951/04/07  MRN: [id number]  Date of Service: 09/03/2023    Discharge Recommendations:  Subacute/Skilled Nursing Facility    DME - defer      Patient Diagnosis(es): The primary encounter diagnosis was General weakness. A diagnosis of Recurrent falls was also pertinent to this visit.  Past Medical History:  has a past medical history of Adenocarcinoma of colon (HCC), Anesthesia, Arthritis, Asthma, CHF (congestive heart failure) (HCC), CKD (chronic kidney disease), Epidural abscess, GERD (gastroesophageal reflux disease), HTN (hypertension), Hyperlipidemia, IBS (irritable bowel syndrome), Incontinence of urine, Lumbar radiculopathy, Morbid obesity with BMI of 45.0-49.9, adult, Multiple allergies, Multiple myeloma (HCC), Muscle fasciculation, Neuropathic pain, Prolonged emergence from general anesthesia, SBO (small bowel obstruction) (HCC), Spinal fracture of T8 vertebra (HCC), Surgical site infection, Urinary incontinence, and Uses walker.  Past Surgical History:  has a past surgical history that includes Appendectomy; Breast lumpectomy (Bilateral); Hysterectomy, total abdominal; Cholecystectomy; Total knee arthroplasty (Bilateral, 2009); Cardiac catheterization (2012); Total shoulder arthroplasty w/ distal clavicle excision (Right, 2010); Abdominal hernia repair (2010); Colonoscopy; Upper gastrointestinal endoscopy (N/A, 05/26/2020); Colonoscopy (05/26/2020); Colonoscopy (05/26/2020); CT BIOPSY BONE MARROW (01/03/2021); Abdomen surgery (N/A, 05/16/2021); XR MIDLINE EQUAL OR GREATER THAN 5 YEARS (07/28/2018); Ovary removal; Colonoscopy (N/A, 11/08/2022); Port Surgery (Left, 12/11/2022); and Colonoscopy (N/A, 12/16/2022).    Assessment  Assessment: Pt lives alone, has HHA 5 days/wk for 5 hrs/day for assist with ADL/homemaking.  Pt reports normally indep with self care and  ambulating without RW at home as her apartment is small.  She is currently below her normal level of functional mobility and will benefit from continued IP therapies to maximize mobility, safety and independence.  Treatment Diagnosis: Decreased functional mobility  Barriers to Learning: none noted  Activity Tolerance  Activity Tolerance: Patient limited by endurance;Patient tolerated evaluation without incident  Activity Tolerance Comments: DOE noted with supine to sit and with gait.  O2 sats remained in the low to mid 90s despite SOB noted by pt.    Plan  Physical Therapy Plan  General Plan:  (2-5)  Current Treatment Recommendations: Functional mobility training, Gait training, Safety education & training, Patient/Caregiver education & training, Equipment evaluation, education, & procurement, Endurance training, Transfer training  Safety Devices  Type of Devices: Call light within reach, Left in chair, Nurse notified, Chair alarm in place    Restrictions  Position Activity Restriction  Other Position/Activity Restrictions: up with assist     Subjective  General  Additional Pertinent Hx: Adm 1/14 with general weakness and falls  Referring Practitioner: Rudene Christians  Diagnosis: weakness/falls  Subjective  Subjective: Pt in bed upon PT entry, agreeable to working with PT, no current complaints of pain noted         Social/Functional History  Social/Functional History  Lives With: Alone  Type of Home: Apartment (first floor)  Home Layout: One level  Home Access: Level entry  Bathroom Shower/Tub: Medical sales representative: Handicap height  Bathroom Equipment: Grab bars around toilet, Grab bars in shower, Academic librarian Accessibility: Accessible  Home Equipment: Designer, industrial/product, Rollator, Alert button, Reacher, Sock aid (long handled sponge)  Has the patient had two or more falls in the past year or any fall with injury in the past year?: Yes  Prior Level of Assist for ADLs: Independent (indep dressing and  bathing,)  Prior Level of Assist for Homemaking: Needs assistance (  aide does laundry, cooking/cleaning.)  Prior Level of Assist for Ambulation: Independent household ambulator, with or without device, Independent community ambulator, with or without device (tends to not use RW in apartment because her apt is small.)  Prior Level of Assist for Transfers: Independent  Active Driver: No  Patient's Driver Info: Pharmacist, community, transportation  Additional Comments: Has aide 5 days a week 5 hrs/day - cleans,  Vision/Hearing  Vision  Vision: Impaired (notes that she needs glasses but cannot afford them at this time.)  Hearing  Hearing: Within functional limits    Cognition   Orientation  Overall Orientation Status: Within Functional Limits  Orientation Level: Oriented to place;Oriented to situation;Oriented to person (Correct month and year.)  Cognition  Overall Cognitive Status: WFL    Objective  Temp: 97.8 F (36.6 C)  Pulse: 76  Heart Rate Source: Telemetry  Respirations: 18  SpO2: 93 %  O2 Device: None (Room air)  BP: 99/76  MAP (Calculated): 84  BP Location: Right lower arm  BP Method: Automatic  Patient Position: Sitting;Up in chair         AROM RLE (degrees)  RLE AROM: WFL  AROM LLE (degrees)  LLE AROM : WFL  Strength RLE  Strength RLE: WFL  Strength LLE  Strength LLE: WFL           Bed mobility  Supine to Sit: Minimal assistance (HOB elevated, use of side rail, unable to raise trunk without A)  Scooting: Stand by assistance (to eob)  Transfers  Sit to Stand: Contact guard assistance (from eob and toilet)  Stand to Sit: Contact guard assistance (to toilet and eob)  Ambulation  Surface: Level tile  Device: Rolling Walker  Assistance: Contact guard assistance  Gait Deviations: Slow Cadence;Decreased step height;Decreased arm swing  Distance: 10 ft, 20 ft         AM-PAC - Mobility    AM-PAC Basic Mobility - Inpatient   How much help is needed turning from your back to your side while in a flat bed without using bedrails?: A  Little  How much help is needed moving from lying on your back to sitting on the side of a flat bed without using bedrails?: A Little  How much help is needed moving to and from a bed to a chair?: A Little  How much help is needed standing up from a chair using your arms?: A Little  How much help is needed walking in hospital room?: A Little  How much help is needed climbing 3-5 steps with a railing?: Total  AM-PAC Inpatient Mobility Raw Score : 16  AM-PAC Inpatient T-Scale Score : 40.78  Mobility Inpatient CMS 0-100% Score: 54.16  Mobility Inpatient CMS G-Code Modifier : CK        Goals  Short Term Goals  Time Frame for Short Term Goals: Discharge  Short Term Goal 1: Bed mobility with CG  Short Term Goal 2: Transfers with SBA  Short Term Goal 3: Ambulate 60 ft with RW and SBA  Patient Goals   Patient Goals : to return home       Education  Patient Education  Education Given To: Patient  Education Provided: Role of Therapy;Plan of Care  Education Provided Comments: importance of OOB, safe transfers and gait  Education Method: Verbal  Barriers to Learning: None  Education Outcome: Verbalized understanding;Continued education needed      Therapy Time   Individual Concurrent Group Co-treatment   Time In 1055  Time Out 1137         Minutes 42             Timed Code Treatment Minutes:   25    Total Treatment Minutes:  [address] [id number]

## 2023-09-03 NOTE — Discharge Instructions (Signed)
Extra Heart Failure Education/ Tools/ Resources:     https://mydigitalpublication.com/publication/?i=753422   --- this is American Heart Association interactive Healthier Living with Heart Failure guidebook.  Please click hyperlink or copy / paste link into search bar. The QR Code is also available below. Use your mouse to scroll through the pages.  Lots of information about weight monitoring, diet tips, activity, meds, etc    Heart Failure Tools and Resources QR Code is below. It includes multiple resources to include symptom tracker, med tracker, further HF info, and access to a HF Support Network online Community    HF Helper App  -- this is a free smart phone app available for Dentist.  Use your phone to track sodium / fluid intake, zone tool symptom tracking, weights, medications, etc. Click on this hyperlink  HF Helper App   for QR code for easy download or the link is also found in the below HF Tools and Resources.      DASH (Dietary Approach to Stop Hypertension) diet --  StartupTour.com.cy -- this diet is a flexible eating plan that promotes heart healthy eating style.  Click on hyperlink or copy / paste link into search bar.  Lots of low sodium recipes and tips.    IdeaBulletin.ch  -- more free recipes

## 2023-09-03 NOTE — Plan of Care (Signed)
Problem: Chronic Conditions and Co-morbidities  Goal: Patient's chronic conditions and co-morbidity symptoms are monitored and maintained or improved  09/03/2023 1130 by Gustavo Lah, RN  Outcome: Progressing  Flowsheets (Taken 09/03/2023 1130)  Care Plan - Patient's Chronic Conditions and Co-Morbidity Symptoms are Monitored and Maintained or Improved:   Monitor and assess patient's chronic conditions and comorbid symptoms for stability, deterioration, or improvement   Collaborate with multidisciplinary team to address chronic and comorbid conditions and prevent exacerbation or deterioration   Update acute care plan with appropriate goals if chronic or comorbid symptoms are exacerbated and prevent overall improvement and discharge     Problem: Pain  Goal: Verbalizes/displays adequate comfort level or baseline comfort level  09/03/2023 1130 by Gustavo Lah, RN  Outcome: Progressing  Flowsheets (Taken 09/03/2023 1130)  Verbalizes/displays adequate comfort level or baseline comfort level:   Encourage patient to monitor pain and request assistance   Assess pain using appropriate pain scale   Administer analgesics based on type and severity of pain and evaluate response   Implement non-pharmacological measures as appropriate and evaluate response   Notify Licensed Independent Practitioner if interventions unsuccessful or patient reports new pain  Note: Patient having some neck and back pain this shift. Medications given per orders. See MAR     Problem: Safety - Adult  Goal: Free from fall injury  09/03/2023 1130 by Gustavo Lah, RN  Outcome: Progressing  Flowsheets (Taken 09/03/2023 1130)  Free From Fall Injury:   Instruct family/caregiver on patient safety   Based on caregiver fall risk screen, instruct family/caregiver to ask for assistance with transferring infant if caregiver noted to have fall risk factors  Note: Patient in bed with bed alarm on, instructed to call for assistance, verbalizes understanding. Fall  precautions in place.

## 2023-09-03 NOTE — Progress Notes (Signed)
09/03/23 1540   RT Protocol   History Pulmonary Disease 2   Respiratory pattern 0   Breath sounds 4   Cough 1   Indications for Bronchodilator Therapy Wheezing associated with pulm disorder   Bronchodilator Assessment Score 7

## 2023-09-03 NOTE — Consults (Signed)
[facility]    Consult Note        CHIEF COMPLAINT:  falls       HISTORY OF PRESENT ILLNESS:    Gabrielle Wells  is a 73 y.o. female we are seeing in consultation for Multiple Myeloma. She is followed by Dr. Marcy Panning as an outpatient and is on single agent Daratumumab. Additionally she reported a worsening cough.  She presented to the ED with c/o generalized weakness and frequent falls over the past 2 weeks. She was seen in the office on 08/22/23 and did not report these symptoms.     UA, electrolytes WNL. She was initially noted to have low grade temp to 100 but then spiked to 102.3. She was noted to have coarse BS bilaterally with crackles. Viral studies negative. She was started on antibiotics for suspected HCAP      Today she reports she feels her ongoing weakness is a result of her back issues and not her legs. She states she has had 5 back surgeries in [place]. Does not see a surgeon here locally. She has been having episodes of SOB in the middle of the night. Feels panicked, wants to hear her daughter's voice and cannot breathe. She denies feeling anxious.       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    s/i I&D and removaal of hardware    GERD (gastroesophageal reflux disease)     HTN (hypertension)     Hyperlipidemia     IBS (irritable bowel syndrome)     Incontinence of urine     Lumbar radiculopathy     Morbid obesity with BMI of 45.0-49.9, adult     Multiple allergies     Multiple myeloma (HCC)     Muscle fasciculation     Neuropathic pain     back 2/2 to spinal surgeries    Prolonged emergence from general anesthesia     SBO (small  bowel obstruction) (HCC) 07/1999    with partial colectomy    Spinal fracture of T8 vertebra (HCC)     s/p fusion     Surgical site infection     back, spinal hardware     Urinary incontinence     Uses walker        Past Surgical History:  Past Surgical History:   Procedure Laterality Date    ABDOMEN SURGERY N/A 05/16/2021    ABDOMINAL FAT PAD EXCISIONAL BIOPSY performed by Lucky Cowboy, MD at [facility] OR    ABDOMINAL HERNIA REPAIR  2010    APPENDECTOMY      BREAST LUMPECTOMY Bilateral     benign lesions    CARDIAC CATHETERIZATION  2012    CHOLECYSTECTOMY      COLONOSCOPY      COLONOSCOPY  05/26/2020    COLONOSCOPY POLYPECTOMY SNARE/COLD BIOPSY performed by Adella Nissen, MD at [facility] ENDOSCOPY    COLONOSCOPY  05/26/2020    COLONOSCOPY WITH BIOPSY performed by Adella Nissen, MD at [facility] ENDOSCOPY    COLONOSCOPY N/A 11/08/2022    COLONOSCOPY WITH BIOPSY, COLD SNARE/HOT SNARE performed by Adella Nissen, MD at  [facility] ENDOSCOPY    COLONOSCOPY N/A 12/16/2022    COLONOSCOPY BIOPSY performed by Adella Nissen, MD at [facility] ENDOSCOPY    CT BIOPSY BONE MARROW  01/03/2021    CT BONE MARROW BIOPSY 01/03/2021 [facility] CT SCAN    HYSTERECTOMY, TOTAL ABDOMINAL (CERVIX REMOVED)      OVARY REMOVAL      PORT SURGERY Left 12/11/2022    LEFT SUBCLAVIAN PORT PLACEMENT performed by Kizzie Furnish, MD at [facility] OR    TOTAL KNEE ARTHROPLASTY Bilateral 2009    TOTAL SHOULDER ARTHROPLASTY W/ DISTAL CLAVICLE EXCISION Right 2010    UPPER GASTROINTESTINAL ENDOSCOPY N/A 05/26/2020    EGD BIOPSY performed by Adella Nissen, MD at [facility] ENDOSCOPY    XR MIDLINE EQUAL OR GREATER THAN 5 YEARS  07/28/2018    XR MIDLINE EQUAL OR GREATER THAN 5 YEARS 07/28/2018       Current Medications:  Current Facility-Administered Medications   Medication Dose Route Frequency Provider Last Rate Last Admin    guaiFENesin (MUCINEX) extended release tablet 600 mg  600 mg Oral BID Lezlie Octave, APRN - CNP   600 mg at 09/03/23 1045    carvedilol (COREG) tablet 25 mg  25 mg  Oral BID WC Gunawardena, Chalana U, MD   25 mg at 09/03/23 1324    sodium chloride flush 0.9 % injection 5-40 mL  5-40 mL IntraVENous 2 times per day Waymond Cera, MD   10 mL at 09/03/23 4010    sodium chloride flush 0.9 % injection 5-40 mL  5-40 mL IntraVENous PRN Gunawardena, Chalana U, MD        0.9 % sodium chloride infusion   IntraVENous PRN Gunawardena, Chalana U, MD        potassium chloride (KLOR-CON M) extended release tablet 40 mEq  40 mEq Oral PRN Gunawardena, Chalana U, MD        Or    potassium bicarb-citric acid (EFFER-K) effervescent tablet 40 mEq  40 mEq Oral PRN Gunawardena, Chalana U, MD        Or    potassium chloride 10 mEq/100 mL IVPB (Peripheral Line)  10 mEq IntraVENous PRN Gunawardena, Chalana U, MD        magnesium sulfate 2000 mg in 50 mL IVPB premix  2,000 mg IntraVENous PRN Gunawardena, Chalana U, MD        ondansetron (ZOFRAN-ODT) disintegrating tablet 4 mg  4 mg Oral Q8H PRN Gunawardena, Chalana U, MD        Or    ondansetron (ZOFRAN) injection 4 mg  4 mg IntraVENous Q6H PRN Gunawardena, Chalana U, MD        polyethylene glycol (GLYCOLAX) packet 17 g  17 g Oral Daily PRN Gunawardena, Chalana U, MD        acetaminophen (TYLENOL) tablet 650 mg  650 mg Oral Q6H PRN Renelda Mom U, MD   650 mg at 09/03/23 2725    Or    acetaminophen (TYLENOL) suppository 650 mg  650 mg Rectal Q6H PRN Gunawardena, Chalana U, MD        labetalol (NORMODYNE;TRANDATE) injection 10 mg  10 mg IntraVENous Q6H PRN Gunawardena, Chalana U, MD   10 mg at 09/02/23 1734    cefepime (MAXIPIME) IVPB 2,000 mg  2,000 mg IntraVENous Q8H Gunawardena, Chalana U, MD 25 mL/hr at 09/03/23 0834 2,000 mg at 09/03/23 0834    apixaban (ELIQUIS) tablet 5 mg  5 mg Oral BID Waymond Cera, MD   5  mg at 09/03/23 1914    linezolid (ZYVOX) IVPB 600 mg  600 mg IntraVENous Q12H Waymond Cera, MD   Stopped at 09/03/23 0630    valsartan (DIOVAN) tablet 320 mg  320 mg Oral Daily Gunawardena, Chalana U, MD    320 mg at 09/03/23 0832    Benzocaine-Menthol (CEPACOL) 1 lozenge  1 lozenge Oral Q2H PRN Gunawardena, Chalana U, MD        ipratropium 0.5 mg-albuterol 2.5 mg (DUONEB) nebulizer solution 1 Dose  1 Dose Inhalation BID RT Gunawardena, Chalana U, MD        albuterol (PROVENTIL) (2.5 MG/3ML) 0.083% nebulizer solution 2.5 mg  2.5 mg Nebulization Q4H PRN Delena Bali, MD   2.5 mg at 09/03/23 0026       Allergies:  Allergies   Allergen Reactions    Aspirin Other (See Comments)     Stomach bleeding    Ibuprofen Other (See Comments)     Stomach bleeding    Iodides Hives    Iodinated Contrast Media Hives and Other (See Comments)     Takes Benadryl 50mg  PO before receiving iodinated contrast    Mushroom Hives and Itching    Povidone Iodine Hives and Itching    Shellfish Allergy Hives and Itching    Sulfa Antibiotics Hives, Itching and Nausea And Vomiting    Sulfasalazine Hives and Itching    Vancomycin Other (See Comments)     Drug induced NEUTROPENIA    Amoxicillin-Pot Clavulanate Itching and Other (See Comments)     Has never had problems with amoxicillin/ penicillin; HAS TOLERATED ZOSYN WITH ZERO PROBLEMS    Ciprofloxacin Other (See Comments)     Made "bottom" raw    Coconut (Cocos Nucifera) Itching    Codeine Itching and Other (See Comments)    Egg-Derived Products Nausea And Vomiting    Fish-Derived Products Nausea And Vomiting    Iodine Itching    Metronidazole Diarrhea and Other (See Comments)     Made "bottom" raw    Soap & Cleansers Rash and Other (See Comments)     Detergents    Sulfacetamide Rash       Social History:  Social History     Socioeconomic History    Marital status: Single     Spouse name: Not on file    Number of children: Not on file    Years of education: Not on file    Highest education level: Not on file   Occupational History    Not on file   Tobacco Use    Smoking status: Never    Smokeless tobacco: Never   Vaping Use    Vaping status: Never Used   Substance and Sexual Activity    Alcohol  use: Never    Drug use: Never    Sexual activity: Not on file   Other Topics Concern    Not on file   Social History Narrative    Not on file     Social Determinants of Health     Financial Resource Strain: Not on file   Food Insecurity: No Food Insecurity (09/02/2023)    Hunger Vital Sign     Worried About Running Out of Food in the Last Year: Never true     Ran Out of Food in the Last Year: Never true   Transportation Needs: No Transportation Needs (09/02/2023)    PRAPARE - Transportation     Lack of Transportation (Medical): No     Lack  of Transportation (Non-Medical): No   Physical Activity: Not on file   Stress: Not on file   Social Connections: Not on file   Intimate Partner Violence: Unknown (09/08/2022)    Received from [employer] and [employer], [employer] and [employer]    Interpersonal Safety     Feel physically or emotionally unsafe where currently live: Not on file     Harm by anyone: Not on file     Emotionally Harmed: Not on file   Housing Stability: Low Risk  (09/02/2023)    Housing Stability Vital Sign     Unable to Pay for Housing in the Last Year: No     Number of Times Moved in the Last Year: 1     Homeless in the Last Year: No          Family History:  Family History   Problem Relation Age of Onset    Cancer Paternal Grandmother         unk     Breast Cancer Maternal Aunt        REVIEW OF SYSTEMS:    Review of Systems   Respiratory:  Positive for shortness of breath.    Musculoskeletal:  Positive for gait problem.   Neurological:  Positive for extremity weakness and gait problem.         PHYSICAL EXAM:      Vitals:  Patient Vitals for the past 24 hrs:   BP Temp Temp src Pulse Resp SpO2 Height Weight   09/03/23 0827 119/63 98.2 F (36.8 C) Oral 85 25 94 % -- --   09/03/23 0617 -- 98.8 F (37.1 C) Oral -- -- -- -- --   09/03/23 0406 124/62 99.3 F (37.4 C) Oral 88 21 94 % -- --   09/03/23 0026 -- -- -- 88 29 95 % -- --   09/03/23 0018 122/65 99.2 F (37.3 C) Oral 87  18 98 % -- --   09/02/23 2112 -- -- -- 93 18 98 % -- --   09/02/23 2057 (!) 119/106 -- -- 96 -- -- -- --   09/02/23 1943 (!) 152/84 100 F (37.8 C) Oral (!) 105 18 98 % -- --   09/02/23 1804 (!) 156/105 (!) 102.3 F (39.1 C) Oral (!) 101 22 95 % -- --   09/02/23 1657 (!) 190/104 -- -- (!) 109 19 99 % -- --   09/02/23 1627 (!) 198/105 -- -- (!) 114 29 96 % -- --   09/02/23 1600 (!) 188/92 -- -- (!) 103 21 95 % -- --   09/02/23 1557 (!) 208/95 -- -- -- -- -- -- --   09/02/23 1521 -- -- -- 100 21 100 % -- --   09/02/23 1445 -- -- -- 98 30 97 % -- --   09/02/23 1415 -- -- -- 95 29 98 % -- --   09/02/23 1315 -- -- -- (!) 107 (!) 31 100 % -- --   09/02/23 1255 (!) 211/166 -- -- (!) 116 26 -- -- --   09/02/23 1225 (!) 194/111 -- -- (!) 114 23 97 % -- --   09/02/23 1215 (!) 187/98 -- -- (!) 117 22 97 % -- --   09/02/23 1211 (!) 178/106 100 F (37.8 C) Oral (!) 116 -- -- 1.6 m (5\' 3" ) 107.3 kg (236 lb 9.6 oz)       Date 09/03/23 0000 - 09/03/23 2359   Shift 2130-8657 8469-6295 2841-3244  24 Hour Total   INTAKE   I.V.(mL/kg/hr) 559(0.7) 10  569   Shift Total(mL/kg) 559(5.2) 10(0.1)  569(5.3)   OUTPUT   Urine(mL/kg/hr) 250(0.3)   250   Shift Total(mL/kg) 250(2.3)   250(2.3)   Weight (kg) 107.3 107.3 107.3 107.3       Physical Exam  Vitals and nursing note reviewed.   HENT:      Head: Normocephalic and atraumatic.      Nose: Nose normal.      Mouth/Throat:      Mouth: Mucous membranes are moist.      Pharynx: Oropharynx is clear.   Eyes:      Extraocular Movements: Extraocular movements intact.      Pupils: Pupils are equal, round, and reactive to light.   Cardiovascular:      Rate and Rhythm: Normal rate.   Pulmonary:      Effort: Pulmonary effort is normal.   Abdominal:      Palpations: Abdomen is soft.   Musculoskeletal:      Cervical back: Normal range of motion.   Skin:     General: Skin is warm and dry.   Neurological:      Mental Status: She is alert. Mental status is at baseline.   Psychiatric:         Mood and  Affect: Mood normal.         Behavior: Behavior normal.         Thought Content: Thought content normal.         Judgment: Judgment normal.            DATA:    PT/INR:  No results for input(s): "INR" in the last 720 hours.    Invalid input(s): "PROT"  PTT:  No results for input(s): "APTT" in the last 720 hours.    CMP:    Recent Labs     09/02/23  1231   NA 139   K 4.0   CL 104   CO2 23   BUN 7     Mg:  No results for input(s): "MG" in the last 720 hours.    Lab Results   Component Value Date    CALCIUM 9.6 09/02/2023    PHOS 3.2 11/14/2021       CBC:    Recent Labs     09/03/23  0423 09/02/23  1231 08/22/23  1859   WBC 8.4 6.6 3.5*   NEUTROABS 6.0 4.0 2.9   LYMPHOPCT 6.0 17.0 15.0   RBC 4.78 5.31* 5.31*   HGB 13.9 15.2 15.2   HCT 42.2 46.9 47.6   MCV 88.3 88.4 89.6   MCH 29.1 28.6 28.7   MCHC 33.0 32.4 32.0   RDW 13.2 13.5 13.8   PLT 76* 87* 128*        LDH:No results for input(s): "LDH" in the last 720 hours.      Radiology Review:  CT LUMBAR RECONSTRUCTION WO POST PROCESS  Narrative: CT LUMBAR RECONSTRUCTION WO POST PROCESS    INDICATION: Fall. Back pain.    COMPARISON: 07/12/2022    TECHNIQUE: Multiplanar CT images of the lumbar spine obtained from  reconstructions of the abdomen and pelvis data set. Up-to-date CT equipment and  radiation dose reduction techniques were employed.    FINDINGS:     Motion artifact somewhat compromises bone detail. Prior body fusion at L4-L5 and  laminectomies at L4-L5 and L5-S1. At least partial bony fusion across posterior  elements from about  L3-S1. No vertebral compression deformity. No definite acute  fracture. Round lucent lesion within the L4 vertebral body and S1 vertebral body  remain unchanged from multiple prior studies.    Severe multilevel degenerative changes. At least moderate if not severe canal  stenosis at L2-L3. Moderate to severe left L1 foraminal stenosis. Moderate  bilateral L2 foraminal stenosis. Severe right and mild to moderate left L3  foraminal stenosis.  Mild right L4 foraminal stenosis. Moderate right and severe  left L5 foraminal stenosis.  Impression: No acute fracture identified in the lumbar spine.    Electronically signed by Renato Battles  CT THORACIC RECONSTRUCTION WO POST PROCESS  Narrative: CT THORACIC RECONSTRUCTION WO POST PROCESS    INDICATION: Fall. Back pain.    COMPARISON: 12/27/2022    TECHNIQUE: Multiplanar CT images of the thoracic spine obtained from  reconstruction of the CT chest data set. Up-to-date CT equipment and radiation  dose reduction techniques were employed.    FINDINGS:     Osseous detail somewhat compromised by patient motion.    Thoracic spine alignment is maintained. No vertebral compression deformity. No  definite acute fracture. Paravertebral soft tissues are unremarkable. Multilevel  degenerative changes. Prominent disc osteophyte complex causing moderate  stenosis at T11-T12 variable neural foraminal stenosis, and for example, severe  bilateral T10 foraminal stenosis, and right and moderate left T11 foraminal  stenosis.  Impression: No acute fracture identified in the thoracic spine.    Electronically signed by Renato Battles  CT CHEST ABDOMEN PELVIS W CONTRAST Additional Contrast? None  Narrative: CT CHEST ABDOMEN PELVIS W CONTRAST    INDICATION: Fall. Chest pain. Abdominal pain.    COMPARISON: 06/30/2023    TECHNIQUE:  CT of the chest, abdomen and pelvis was performed with intravenous  contrast. Coronal and sagittal reconstructions were created. Up-to-date CT  equipment and radiation dose reduction techniques were employed.  IV Contrast: 75 mL Isovue-[id number]    FINDINGS:    CHEST:    LUNGS AND AIRWAYS: Respiratory motion compromises detail of the lungs. Central  airways are patent. Mild airspace disease/atelectasis in the basal lower lobes  bilaterally.    PLEURA: No effusion or significant pleural thickening.    MEDIASTINUM: Heart is borderline in size. No pericardial effusion. Normal  caliber aorta. Atherosclerosis in the aorta and  coronary arteries. Right IJ  chest port tip at cavoatrial junction no lymphadenopathy.    LYMPH NODES: No significant lymphadenopathy    CHEST WALL/LOWER NECK: No significant findings identified.    BONES: The thoracic spine is separately reported. No acute findings identified.    ABDOMEN AND PELVIS:    LIVER: Grossly normal within limits of motion and streak artifact.    GALLBLADDER AND BILIARY TREE: Gallbladder not seen, presumed surgically absent.  No significant ductal dilation.    PANCREAS: Grossly normal within limits of motion and streak artifacts.    SPLEEN: Grossly normal.    ADRENALS: Grossly normal.    KIDNEYS: No hydronephrosis. Cyst in the right kidney which does not specifically  warrant follow-up.    BLADDER: Normal.    REPRODUCTIVE ORGANS: Prior hysterectomy.    BOWEL: Normal caliber. Diverticulosis in the colon.    LYMPH NODES: No significant lymphadenopathy.    PERITONEUM: No free air or free fluid    VESSELS: Normal caliber aorta.    ABDOMINAL WALL: Post ventral abdominal wall repair with mesh.    BONES: The lumbar spine is separately reported. No acute findings identified.  Impression: CHEST:    1.  Motion compromised study.  2.  Mild bibasilar airspace disease/atelectasis.  3.  No other convincing acute abnormality seen.    ABDOMEN AND PELVIS:    1.  Motion compromised study.  2.  No convincing acute abnormality seen.    Electronically signed by Renato Battles  CT CERVICAL SPINE WO CONTRAST  Narrative: CT CERVICAL SPINE WO CONTRAST    INDICATION: Fall. Neck pain.    COMPARISON: 08/22/2023    TECHNIQUE: Contiguous axial CT was performed of the cervical spine. Coronal and  sagittal reformations were created. Up-to-date CT equipment and radiation dose  reduction techniques were employed.    FINDINGS:    Bone detail is somewhat limited by patient motion and body habitus. 1 mm grade 1  nausea stasis of see foreign C5. Reversal of normal cervical lordosis. No  significant prevertebral swelling. The  craniocervical junction and atlantoaxial  joint appear intact. No evidence of cervical spine fracture.    Multilevel degenerative disease and degenerative disease unchanged from recent  comparison.  Impression: No evidence of cervical spine fracture.    Electronically signed by Renato Battles  CT HEAD WO CONTRAST  Narrative: CT HEAD WO CONTRAST    INDICATION: Headache after fall.    COMPARISON: 08/22/2023    TECHNIQUE:  CT head was performed without intravenous contrast. Coronal and  sagittal reformations were created. Up-to-date CT equipment and radiation dose  reduction techniques were employed.    FINDINGS:    No acute intracranial hemorrhage or extra-axial fluid collection. No mass effect  or midline shift. Gray-white matter differentiation is maintained. Mild  nonspecific periventricular and deep white matter hypoattenuation compatible  with chronic small vessel ischemia.    Ventricles are normal in size for age. Basal cisterns are patent.    Calvarium is intact. Visualized paranasal sinuses and mastoid air cells are  clear.  Impression: No acute intracranial hemorrhage or mass effect.    Electronically signed by Renato Battles  XR HUMERUS RIGHT (MIN 2 VIEWS)  Narrative: INDICATION: Trauma. Pain.    RIGHT HUMERUS    2 views were performed. No comparison. Bones appear mildly osteopenic. Evidence of right humeral proximal arthroplasty. No evidence of acute fracture. No dislocation identified. Narrowed glenohumeral space identified a moderate narrowing of the   acromiohumeral space noted with mild to moderate hypertrophy of the acromioclavicular joint. No pathologic calcification identified. Soft tissues are unremarkable.  Impression: 1.  Right humeral arthroplasty. Associated mild to moderate right shoulder deformity likely related to degenerative process, possible rotator cuff abnormality.  2.  No evidence of acute fracture.    Electronically signed by MD Vallarie Mare      Problem List  Patient Active Problem List    Diagnosis    Abscess in epidural space of lumbar spine    Resistant hypertension    GERD (gastroesophageal reflux disease)    Back wound    AKI (acute kidney injury) (HCC)    CKD (chronic kidney disease), stage II    Disorder involving thrombocytopenia (HCC)    Vitamin D deficiency    Hyperlipidemia    Morbid obesity    BPPV (benign paroxysmal positional vertigo)    Fall at home, initial encounter    Monoclonal gammopathy of unknown significance (MGUS)    Suspected condition    Acute blood loss anemia    Backache    Severe hypertension    Chronic pain disorder    Community acquired pneumonia    CSF leak    Depression  Diarrhea    Discitis of lumbosacral region    Elevated lipase    Fever and chills    Fracture of neck of left humerus, closed, initial encounter    Fracture of T10 vertebra (HCC)    Generalized anxiety disorder    Glenohumeral arthritis    Proteus infection    Humerus fracture    Hypotensive episode    Intertrigo    Obstipation    Klebsiella infection    Labile blood pressure    Leukopenia    Left knee DJD    Lumbar pars defect    Narcotic-induced respiratory depression    Neuropathic pain    Obesity hypoventilation syndrome    Other specified anemias    Physical deconditioning    Pill dysphagia    Postoperative pain    Protein-calorie malnutrition, severe (HCC)    Pruritic condition    Zoster    Wound infection after surgery    CHF (congestive heart failure) (HCC)    Multiple myeloma (HCC)    Weakness generalized       IMPRESSION/RECOMMENDATIONS:     Lambda light chain multiple myeloma     - Fat pad bx - neg for congo red      - PET/CT (01/22/2021) - no FDG avid lesions      - BM BX (01/03/21) 35% cellular marrow with no evidence of plasma cell neoplasia     - 04/2022: reviewed repeat PET CT, no evidence of myeloma although diffuse heterogenous marrow.     - 05/2022: bone marrow biopy: reviewed no evidence of myeloma or amyloid     - 09/2022: repeat PET CT without obvious evidence of bony lesions but  heterogeneous marrow activity     - 09/2022: LCR 0.01, biopsy with 15-20% plasma cells  - S/p initial treatment with dara/velcade/dex  - currently on Dara monotherapy, last treated 08/22/23--due again 09/19/23  - I reassured the patient that it is ok to hold her treatment while she is in a SNF.   -her appointment will be rescheduled accordingly     SIRS criteria for sepsis   Suspected HCAP   Management per primary team     HTN  Coreg dose increased     Back pain   Will consider NSG referral as an outpatient       Thank you for asking me to see the patient. The case was discussed with the patient's daughter today       Gabrielle Alberta, APRN - CNP  Please Contact Through [employer]

## 2023-09-03 NOTE — Progress Notes (Signed)
Occupational Therapy  Facility/Department: [facility]T [facility]  Occupational Therapy Initial Assessment/Treatment    Name: Gabrielle Wells  DOB: 09/23/1950  MRN: [id number]  Date of Service: 09/03/2023    Discharge Recommendations:  Subacute/Skilled Nursing Facility  OT Equipment Recommendations  Equipment Needed: No       Patient Diagnosis(es): The primary encounter diagnosis was General weakness. A diagnosis of Recurrent falls was also pertinent to this visit.  Past Medical History:  has a past medical history of Adenocarcinoma of colon (HCC), Anesthesia, Arthritis, Asthma, CHF (congestive heart failure) (HCC), CKD (chronic kidney disease), Epidural abscess, GERD (gastroesophageal reflux disease), HTN (hypertension), Hyperlipidemia, IBS (irritable bowel syndrome), Incontinence of urine, Lumbar radiculopathy, Morbid obesity with BMI of 45.0-49.9, adult, Multiple allergies, Multiple myeloma (HCC), Muscle fasciculation, Neuropathic pain, Prolonged emergence from general anesthesia, SBO (small bowel obstruction) (HCC), Spinal fracture of T8 vertebra (HCC), Surgical site infection, Urinary incontinence, and Uses walker.  Past Surgical History:  has a past surgical history that includes Appendectomy; Breast lumpectomy (Bilateral); Hysterectomy, total abdominal; Cholecystectomy; Total knee arthroplasty (Bilateral, 2009); Cardiac catheterization (2012); Total shoulder arthroplasty w/ distal clavicle excision (Right, 2010); Abdominal hernia repair (2010); Colonoscopy; Upper gastrointestinal endoscopy (N/A, 05/26/2020); Colonoscopy (05/26/2020); Colonoscopy (05/26/2020); CT BIOPSY BONE MARROW (01/03/2021); Abdomen surgery (N/A, 05/16/2021); XR MIDLINE EQUAL OR GREATER THAN 5 YEARS (07/28/2018); Ovary removal; Colonoscopy (N/A, 11/08/2022); Port Surgery (Left, 12/11/2022); and Colonoscopy (N/A, 12/16/2022).    Treatment Diagnosis: Impaired ADL, functional mobility, functional activity  tolerance      Assessment  Performance deficits / Impairments: Decreased functional mobility ;Decreased ADL status;Decreased endurance;Decreased ROM  Assessment: Pt presents with a decline in functional independence.  Pt is from home alone, but has HHA 5 days/week that assists with housekeeping.  Currently, pt req CG and use of wheeled walker for functional mobility and assist with ADL.  Feel pt would benefit from further skilled IP OT to maximize safety and functional independence.  Treatment Diagnosis: Impaired ADL, functional mobility, functional activity tolerance  Prognosis: Good  Decision Making: Medium Complexity  REQUIRES OT FOLLOW-UP: Yes  Activity Tolerance  Activity Tolerance: Patient limited by fatigue;Patient limited by pain  Activity Tolerance Comments: SOB with activity (O2 sats low to mid 90s), and c/o neck pain.     Plan  Occupational Therapy Plan  Times Per Week: 2-5  Current Treatment Recommendations: Strengthening, ROM, Balance training, Functional mobility training, Endurance training, Self-Care / ADL    Restrictions  Position Activity Restriction  Other Position/Activity Restrictions: up with assist    Subjective  General  Additional Pertinent Hx: Pt admitted 09/02/23 with extreme fatigue, severe generalized weakness and worsening cough, frequent falls.     CT head= No acute intracranial hemorrhage or mass effect.    CT C/T/L-spine= No acute fracture identified  Family / Caregiver Present: Yes (Sister present at beginning of session)  Referring Practitioner: Dr. Rudene Christians  Diagnosis: Generalized weakness  Subjective  Subjective: Pt in bed upon entry.   Pt agreeable to activity.  Pain: Initially 0/10. At end of session, pt c/o neck pain, not rated, nurse aware and warm pack applied     Social/Functional History  Social/Functional History  Lives With: Alone  Type of Home: Apartment (first floor)  Home Layout: One level  Home Access: Level entry  Bathroom Shower/Tub: Consulting civil engineer: Handicap height  Bathroom Equipment: Grab bars around toilet, Grab bars in shower, Academic librarian Accessibility: Accessible  Home Equipment: Designer, industrial/product, Rollator, Alert button, Reacher,  Sock aid (long handled sponge)  Has the patient had two or more falls in the past year or any fall with injury in the past year?: Yes  Prior Level of Assist for ADLs: Independent (indep dressing and bathing,)  Prior Level of Assist for Homemaking: Needs assistance (aide does laundry, cooking/cleaning.)  Prior Level of Assist for Ambulation: Independent household ambulator, with or without device, Independent community ambulator, with or without device (tends to not use RW in apartment because her apt is small.)  Prior Level of Assist for Transfers: Independent  Active Driver: No  Patient's Driver Info: uber, transportation  Additional Comments: Has aide 5 days a week 5 hrs/day - cleans,    Objective  Vision  Vision: Impaired (Needs glasses)  Hearing  Hearing: Within functional limits          Safety Devices  Type of Devices: Call light within reach;Left in chair;Nurse notified;Chair alarm in place (Lunch tray in place)     Copywriter, advertising - Technique: Ambulating  Equipment Used: Standard toilet (grab bar)  Toilet Transfer: Contact guard assistance  AROM: Generally decreased, functional (decreased shld flx bilaterally)  Strength: Generally decreased, functional (Decreased shld function bilaterlly)  Coordination: Within functional limits  ADL  Feeding: Setup  Grooming: Contact guard assistance (to wash hands, standing at sink)  LE Bathing: Stand by assistance (to wipe LE with bath wipe while seated on toilet)  LE Dressing: Contact guard assistance (to don brief)  Putting On/Taking Off Footwear Skilled Clinical Factors: Pt uses a sock aid at home  Toileting: Contact guard assistance  Product Used : Bath wipes;Chlorhexidine wipes (line care)     Activity Tolerance  Activity Tolerance: Patient limited by  endurance;Patient tolerated evaluation without incident  Activity Tolerance Comments: DOE noted with supine to sit and with gait.  O2 sats remained in the low to mid 90s despite SOB noted by pt.  Bed mobility  Supine to Sit: Minimal assistance (HOB elevated, use of side rail, unable to raise trunk without A)  Scooting: Stand by assistance (to eob)  Transfers  Stand Step Transfers: Contact guard assistance (with wheeled walker)  Sit to stand: Contact guard assistance  Stand to sit: Contact guard assistance  Transfer Comments: Pt walked to/from bathroom with wheeled walker and CG  Vision  Vision: Impaired (notes that she needs glasses but cannot afford them at this time.)  Hearing  Hearing: Within functional limits  Cognition  Overall Cognitive Status: WFL  Orientation  Overall Orientation Status: Within Functional Limits  Orientation Level: Oriented to place;Oriented to situation;Oriented to person (Correct month and year.)                  Education Given To: Patient  Education Provided: Role of Therapy;Plan of Armed forces operational officer  Education Method: Verbal;Demonstration  Barriers to Learning: None  Education Outcome: Verbalized understanding;Continued education needed            Treatment included ADL and transfer training.       AM-PAC - ADL  AM-PAC Daily Activity - Inpatient   How much help is needed for putting on and taking off regular lower body clothing?: A Little  How much help is needed for bathing (which includes washing, rinsing, drying)?: A Lot  How much help is needed for toileting (which includes using toilet, bedpan, or urinal)?: A Little  How much help is needed for putting on and taking off regular upper body clothing?: A Little  How much help  is needed for taking care of personal grooming?: A Little  How much help for eating meals?: A Little  AM-PAC Inpatient Daily Activity Raw Score: 17  AM-PAC Inpatient ADL T-Scale Score : 37.26  ADL Inpatient CMS 0-100% Score:  50.11  ADL Inpatient CMS G-Code Modifier : CK    Goals                       No goals met  Short Term Goals  Time Frame for Short Term Goals: Discharge  Short Term Goal 1: Transfer to/from toilet with supervision  Short Term Goal 2: Stance with supervision x5 min while engaging in ADL  Short Term Goal 3: Complete toileting with supervision  Patient Goals   Patient goals : Go home      Therapy Time   Individual Concurrent Group Co-treatment   Time In 1055         Time Out 1140         Minutes 45         Timed Code Treatment Minutes: 35 Minutes   Total Treatment Time:45    Derrill Kay, OTR/L [phone]

## 2023-09-04 LAB — CBC WITH AUTO DIFFERENTIAL
Basophils %: 0 %
Basophils Absolute: 0 10*3/uL (ref 0.0–0.2)
Eosinophils %: 1 %
Eosinophils Absolute: 0.1 10*3/uL (ref 0.0–0.6)
Hematocrit: 43.4 % (ref 36.0–48.0)
Hemoglobin: 14.1 g/dL (ref 12.0–16.0)
Lymphocytes %: 20 %
Lymphocytes Absolute: 1.5 10*3/uL (ref 1.0–5.1)
MCH: 28.7 pg (ref 26.0–34.0)
MCHC: 32.4 g/dL (ref 31.0–36.0)
MCV: 88.6 fL (ref 80.0–100.0)
MPV: 10.5 fL (ref 5.0–10.5)
Monocytes %: 32 %
Monocytes Absolute: 2.3 10*3/uL — ABNORMAL HIGH (ref 0.0–1.3)
Neutrophils %: 47 %
Neutrophils Absolute: 3.4 10*3/uL (ref 1.7–7.7)
Platelets: 77 10*3/uL — ABNORMAL LOW (ref 135–450)
RBC: 4.9 M/uL (ref 4.00–5.20)
RDW: 13.5 % (ref 12.4–15.4)
WBC: 7.3 10*3/uL (ref 4.0–11.0)

## 2023-09-04 LAB — RENAL FUNCTION PANEL
Albumin: 3.7 g/dL (ref 3.4–5.0)
Anion Gap: 10 (ref 3–16)
BUN: 10 mg/dL (ref 7–20)
CO2: 24 mmol/L (ref 21–32)
Calcium: 8.6 mg/dL (ref 8.3–10.6)
Chloride: 102 mmol/L (ref 99–110)
Creatinine: 1.1 mg/dL (ref 0.6–1.2)
Est, Glom Filt Rate: 53 — AB (ref >60)
Glucose: 119 mg/dL — ABNORMAL HIGH (ref 70–99)
Phosphorus: 3 mg/dL (ref 2.5–4.9)
Potassium: 4 mmol/L (ref 3.5–5.1)
Sodium: 136 mmol/L (ref 136–145)

## 2023-09-04 MED ORDER — FUROSEMIDE 10 MG/ML IJ SOLN
10 | Freq: Once | INTRAMUSCULAR | Status: AC
Start: 2023-09-04 — End: 2023-09-04
  Administered 2023-09-04: 21:00:00 40 mg via INTRAVENOUS

## 2023-09-04 MED ORDER — CEFEPIME HCL 2 GM/100ML IV SOLN
2 | Freq: Two times a day (BID) | INTRAVENOUS | Status: AC
Start: 2023-09-04 — End: 2023-09-05
  Administered 2023-09-05 (×2): 2000 mg via INTRAVENOUS

## 2023-09-04 MED ORDER — GABAPENTIN 300 MG PO CAPS
300 | Freq: Three times a day (TID) | ORAL | Status: DC
Start: 2023-09-04 — End: 2023-09-05
  Administered 2023-09-04 – 2023-09-05 (×5): 300 mg via ORAL

## 2023-09-04 MED FILL — GABAPENTIN 300 MG PO CAPS: 300 MG | ORAL | Qty: 1

## 2023-09-04 MED FILL — IPRATROPIUM-ALBUTEROL 0.5-2.5 (3) MG/3ML IN SOLN: 0.5-2.5 (3) MG/3ML | RESPIRATORY_TRACT | Qty: 6

## 2023-09-04 MED FILL — ZYVOX 600 MG/300ML IV SOLN: 600 MG/300ML | INTRAVENOUS | Qty: 300

## 2023-09-04 MED FILL — CEFEPIME HCL 2 GM/100ML IV SOLN: 2 GM/100ML | INTRAVENOUS | Qty: 100

## 2023-09-04 MED FILL — ELIQUIS 5 MG PO TABS: 5 MG | ORAL | Qty: 1

## 2023-09-04 MED FILL — MUCINEX 600 MG PO TB12: 600 MG | ORAL | Qty: 1

## 2023-09-04 MED FILL — ALBUTEROL SULFATE (2.5 MG/3ML) 0.083% IN NEBU: RESPIRATORY_TRACT | Qty: 3

## 2023-09-04 MED FILL — IPRATROPIUM-ALBUTEROL 0.5-2.5 (3) MG/3ML IN SOLN: 0.5-2.5 (3) MG/3ML | RESPIRATORY_TRACT | Qty: 3

## 2023-09-04 MED FILL — FUROSEMIDE 10 MG/ML IJ SOLN: 10 MG/ML | INTRAMUSCULAR | Qty: 4

## 2023-09-04 MED FILL — CARVEDILOL 25 MG PO TABS: 25 MG | ORAL | Qty: 1

## 2023-09-04 MED FILL — VALSARTAN 320 MG PO TABS: 320 MG | ORAL | Qty: 1

## 2023-09-04 NOTE — Progress Notes (Incomplete)
This RN messaged MD Betti Cruz regarding crackles in pt lungs post RT treatment with PMH of CHF. See new orders. Pt BP soft, held coreg at this time per MD.

## 2023-09-04 NOTE — Care Coordination-Inpatient (Addendum)
Addendum at 1:30pm: Spoke with pt and Marena Chancy, family with pt permission. Updated both on the acceptance at [facility] and precert remains pending. No additional questions at this time    Addendum at 10:27am: Received call from [place] at [facility] stating able to accept and will start precert.     8:20am: Spoke with pt at bedside. Discussed SNF recommendations and pt stated she is agreeable to SNF at [facility] where her niece Clarisse Gouge works for short term only. Offered supportive listening throughout the conversation. She was encouraged to talk with the provider and RN about her breathing questions.     She called Clarisse Gouge, niece when Clinical research associate was at bedside with the request for writer to speak with her. Clarisse Gouge said that the sales rep is Mitzi Davenport and can be reached at [phone].    Called [place], Airline pilot rep for [employer] and provided the referral. She states will review and call writer back if able to accept. If accepted, SNF will start precert.     Pryor Ochoa MSW, LISW-S  Child psychotherapist for [employer] and Journalist, newspaper)  [employer]: [phone]

## 2023-09-04 NOTE — Progress Notes (Signed)
Physical Therapy  Facility/Department: [facility] 3T [facility]  Physical Therapy Treatment    Name: HEILYN GAGNE  DOB: 1951-01-14  MRN: [id number]  Date of Service: 09/04/2023    Discharge Recommendations:  Subacute/Skilled Nursing Facility   PT Equipment Recommendations  Equipment Needed:  (defer)      Patient Diagnosis(es): The primary encounter diagnosis was General weakness. A diagnosis of Recurrent falls was also pertinent to this visit.  Past Medical History:  has a past medical history of Adenocarcinoma of colon (HCC), Anesthesia, Arthritis, Asthma, CHF (congestive heart failure) (HCC), CKD (chronic kidney disease), Epidural abscess, GERD (gastroesophageal reflux disease), HTN (hypertension), Hyperlipidemia, IBS (irritable bowel syndrome), Incontinence of urine, Lumbar radiculopathy, Morbid obesity with BMI of 45.0-49.9, adult, Multiple allergies, Multiple myeloma (HCC), Muscle fasciculation, Neuropathic pain, Prolonged emergence from general anesthesia, SBO (small bowel obstruction) (HCC), Spinal fracture of T8 vertebra (HCC), Surgical site infection, Urinary incontinence, and Uses walker.  Past Surgical History:  has a past surgical history that includes Appendectomy; Breast lumpectomy (Bilateral); Hysterectomy, total abdominal; Cholecystectomy; Total knee arthroplasty (Bilateral, 2009); Cardiac catheterization (2012); Total shoulder arthroplasty w/ distal clavicle excision (Right, 2010); Abdominal hernia repair (2010); Colonoscopy; Upper gastrointestinal endoscopy (N/A, 05/26/2020); Colonoscopy (05/26/2020); Colonoscopy (05/26/2020); CT BIOPSY BONE MARROW (01/03/2021); Abdomen surgery (N/A, 05/16/2021); XR MIDLINE EQUAL OR GREATER THAN 5 YEARS (07/28/2018); Ovary removal; Colonoscopy (N/A, 11/08/2022); Port Surgery (Left, 12/11/2022); and Colonoscopy (N/A, 12/16/2022).    Assessment  Assessment: Needed decreased assist for bed mobility.  All mobility slow and effortful.  Fatigues quickly.  Below baseline  function.  Needing SBA for bed mobility, CGA for transfers and CGA for ambulation with walker.  At risk for falls.  Safety concerns for pt returning home alone.  Rec cont skilled PT to maximize mobility and independence.  Treatment Diagnosis: Decreased functional mobility       Plan  Physical Therapy Plan  General Plan:  (2-5)  Current Treatment Recommendations: Functional mobility training, Gait training, Safety education & training, Patient/Caregiver education & training, Equipment evaluation, education, & procurement, Endurance training, Transfer training  Safety Devices  Type of Devices: Call light within reach, Left in chair, Nurse notified, Chair alarm in place    Restrictions  Position Activity Restriction  Other Position/Activity Restrictions: up with assist     Subjective  General  Chart Reviewed: Yes  Additional Pertinent Hx: Pt admitted 09/02/23 with extreme fatigue, severe generalized weakness and worsening cough, frequent falls.     CT head= No acute intracranial hemorrhage or mass effect.    CT C/T/L-spine= No acute fracture identified  Subjective  Subjective: Pt found supine.  Agreeable to PT.  Denies pain.  "That chair is uncomfortable."         Social/Functional History  Social/Functional History  Lives With: Alone  Type of Home: Apartment (first floor)  Home Layout: One level  Home Access: Level entry  Bathroom Shower/Tub: Medical sales representative: Handicap height  Bathroom Equipment: Grab bars around toilet, Grab bars in shower, Academic librarian Accessibility: Accessible  Home Equipment: Designer, industrial/product, Rollator, Alert button, Reacher, Sock aid (long handled sponge)  Has the patient had two or more falls in the past year or any fall with injury in the past year?: Yes  Prior Level of Assist for ADLs: Independent (indep dressing and bathing,)  Prior Level of Assist for Homemaking: Needs assistance (aide does laundry, cooking/cleaning.)  Prior Level of Assist for Ambulation:  Independent household ambulator, with or without device, Independent community  ambulator, with or without device (tends to not use RW in apartment because her apt is small.)  Prior Level of Assist for Transfers: Independent  Active Driver: No  Patient's Driver Info: uber, transportation  Additional Comments: Has aide 5 days a week 5 hrs/day - cleans,  Vision/Hearing  Vision  Vision: Impaired (notes that she needs glasses but cannot afford them at this time.)  Hearing  Hearing: Within functional limits    Cognition   Orientation  Overall Orientation Status: Within Functional Limits    Objective                              Bed mobility  Supine to Sit: Stand by assistance (HOB elevated, increased time to complete)  Transfers  Sit to Stand: Contact guard assistance (from bed and commode with grab bar)  Stand to Sit: Contact guard assistance (to commode and to chair, cues for safety and hand placement)  Ambulation  Device: Rolling Walker  Assistance: Contact guard assistance  Quality of Gait: decreased step length/height, decreased cadence, slow and effortful, mild DOE  Distance: 10', 20'  Comments: Fatigued with ambulation.  Declined further gt        Exercise Treatment: AP, LAQ, marching x 10 reps BLE sitting       OutComes Score                                                  AM-PAC - Mobility    AM-PAC Basic Mobility - Inpatient   How much help is needed turning from your back to your side while in a flat bed without using bedrails?: A Little  How much help is needed moving from lying on your back to sitting on the side of a flat bed without using bedrails?: A Little  How much help is needed moving to and from a bed to a chair?: A Little  How much help is needed standing up from a chair using your arms?: A Little  How much help is needed walking in hospital room?: A Little  How much help is needed climbing 3-5 steps with a railing?: A Lot  AM-PAC Inpatient Mobility Raw Score : 17  AM-PAC Inpatient T-Scale Score :  42.13  Mobility Inpatient CMS 0-100% Score: 50.57  Mobility Inpatient CMS G-Code Modifier : CK         [place] Score       Goals  Short Term Goals  Time Frame for Short Term Goals: Discharge  Short Term Goal 1: Bed mobility with CG.  MET 1/16.  Revised goal:  Pt will perform bed mobility modified independent  Short Term Goal 2: Transfers with SBA. Ongoing  Short Term Goal 3: Ambulate 60 ft with RW and SBA.  Ongoing  Patient Goals   Patient Goals : to return home       Education  Patient Education  Education Given To: Patient  Education Provided: Role of Therapy;Plan of Care  Education Provided Comments: importance of OOB, safe transfers and gait  Education Method: Verbal  Barriers to Learning: None  Education Outcome: Verbalized understanding;Continued education needed      Therapy Time   Individual Concurrent Group Co-treatment   Time In 1040         Time Out 1108  Minutes 28             Timed Code Treatment Minutes:28       Total Treatment Minutes:  [address] [place], PT

## 2023-09-04 NOTE — Plan of Care (Signed)
Problem: Pain  Goal: Verbalizes/displays adequate comfort level or baseline comfort level  Outcome: Progressing  Flowsheets (Taken 09/04/2023 1601)  Verbalizes/displays adequate comfort level or baseline comfort level:   Encourage patient to monitor pain and request assistance   Implement non-pharmacological measures as appropriate and evaluate response   Assess pain using appropriate pain scale  Note: Pt encouraged to be active participant in pain management during shift. Pt c/o pain alleviated with scheduled gabapentin.      Problem: Safety - Adult  Goal: Free from fall injury  Outcome: Progressing  Flowsheets (Taken 09/04/2023 1601)  Free From Fall Injury: Instruct family/caregiver on patient safety  Note: Pt is a high fall risk (see Lattie Corns Fall Risk assessment).  Oriented pt to room and call light. Instructed pt to call for help when needed.  Call light and personal items within reach.  Bed in lowest position, brakes on, and 2/4 side rails up.  Non-skid footwear on. Falls risk stop sign on door; hourly visual checks implemented.  Falls risk arm band on pt.  Bed alarm is on.  Pt using call light appropriately.  Will continue to monitor.       Problem: Cardiovascular - Adult  Goal: Absence of cardiac dysrhythmias or at baseline  Outcome: Progressing  Flowsheets (Taken 09/04/2023 1601)  Absence of cardiac dysrhythmias or at baseline:   Monitor cardiac rate and rhythm   Assess for signs of decreased cardiac output  Note: Pt on tele an NSR during shift. MD messaged for pt crackles in lungs.      Problem: Metabolic/Fluid and Electrolytes - Adult  Goal: Electrolytes maintained within normal limits  Outcome: Progressing  Flowsheets (Taken 09/04/2023 1601)  Electrolytes maintained within normal limits: Monitor labs and assess patient for signs and symptoms of electrolyte imbalances  Note:   Lab Results   Component Value Date    NA 136 09/04/2023    K 4.0 09/04/2023    CL 102 09/04/2023    CO2 24 09/04/2023

## 2023-09-04 NOTE — Progress Notes (Addendum)
Hospital Medicine Progress Note      Date of Admission: 09/02/2023  Hospital Day: 3    Chief Admission Complaint:  generalized weakness, productive cough     Subjective:  patient still feels run down, but somewhat better. SIRS negative for nearly 48 hours, MRSA probe pending.     Presenting Admission History:       Gabrielle Wells is a 73 y.o. female with pmh as mentioned above presents with complaints of extreme generalized weakness and frequent falls for at least past 2 weeks.  Patient is unable to ambulate currently.  Initially patient had a fever of 100 F, later on she developed high-grade fever  Patient has hoarse voice due to worsening cough over the past 2 weeks.  Denies SOB or chest pain.  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  Receives once every month and last dose is about a week ago.  Patient's blood pressure was significantly elevated on admission and was tachycardic as well  Denies weight gain, leg swelling, orthopnea    Assessment/Plan:      Current Principal Problem:  Weakness generalized    #Significant generalized weakness  #Frequent falls  -Electrolytes within normal limits  -UA not consistent with infection  -Will hold statins for now until patient recovers  -PT/OT  -Fall precautions     #SIRS criteria for sepsis (initial temperature of 100 F, patient later developed a fever of 102.3 F, tachycardia)  #Worsening cough over the past 2 weeks  #Suspected HCAP  #Abnormal lung auscultation  -Patient is on chemotherapy once a month, last session a week ago  -CT chest with bibasilar airspace disease  -Lung auscultation with coarse breath sounds bilaterally with bibasilar crackles  -Influenza and COVID-19 PCR-negative  -Given immunocompromised status start on broad-spectrum antibiotics after obtaining blood cultures x 2  -Patient has documented allergy to vancomycin  -Will start on linezolid and cefepime, bronchodilator therapy, symptomatic therapy     #MGUS  -Oncology consulted  -On  chemotherapy once a month, last session-a week ago     #Hypertensive urgency  -Has history of resistant hypertension  -Increased patient's Coreg dose from 12.5 to 25 mg twice daily  -Continue on home doses of losartan  -IV antihypertensives as needed     #Elevated high-sensitivity troponin, trending up-likely secondary to elevated blood pressure  -Patient denies chest pain or SOB except when coughing  -EKG with no acute ischemic changes     #Chronic anticoagulation with Eliquis for prior history of pulmonary embolism  #Chronic thrombocytopenia-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  Respiratory: Clear to auscultation  Gastrointestinal: Soft, non tender  Genitourinary: no suprapubic tenderness  Musculoskeletal: No edema  Skin: warm, dry  Neuro: Alert.  Psych: Mood appropriate.     BP 109/78   Pulse 78   Temp 98.5 F (36.9 C) (Oral)   Resp 21   Ht 1.6 m (5\' 3" )   Wt 105.9 kg (233 lb 6.4 oz)   SpO2 94%   BMI 41.34 kg/m     Diet: ADULT DIET; Regular; Low Sodium (2 gm)  DVT Prophylaxis: [] PPx LMWH  [] SQ Heparin  [] IPC/SCDs  [] Eliquis  [] Xarelto  [] Coumadin  [] Other -      Code status: Full Code  PT/OT Eval Status:   []   NOT yet ordered  [] Ordered and Pending   [] Seen with Recommendations for:  [] Home independently  [] Home w/ assist  [] HHC  [] SNF  [] Acute Rehab    Anticipated Discharge Day/Date:  today or tomorrow, pending MRSA swab    Anticipated Discharge Location: [] Home  [] HHC  [] SNF  [] Acute Rehab  [] ECF  [] LTAC  [] Hospice  [] Other -      Consults:      None      This patient has a high likelihood of being discharged tomorrow and is appropriate for A1 Discharge Unit in AM pending clinical course overnight: [] Yes   [] No    ------------------------------------------------------------------------------------------------------------------------------------------------------------------------    MDM - level 3  [x]  High (any 2):    A. Problems (any 1):  [x]  Acute/Chronic Illness/injury posing threat to life or bodily function: pneumonia in immunocompromised patient     []  Severe exacerbation of chronic illness:    ---------------------------------------------------------------------  B. Risk of Treatment (any 1):   [x]  Drugs/treatments that requiring intensive monitoring for toxicity:    [x]  IV ABX requiring serial renal monitoring for nephrotoxicity: vanc/cefepime    []  IV Narcotic analgesia for adverse drug reaction  []  IV diuresis requiring serial monitoring for renal impairment and electrolyte derangements  []  Critical electrolyte abnormalities requiring IV replacement and close serial monitoring  []  Insulin - monitoring serial glucose measurements for hypoglycemia  []  Anticoagulation requiring serial monitoring of coagulation factors  []  Other -  []  Change in code status:    []  Decision to de-escalate care this DOS due to change in treatment goals:    []  Decision to escalate care to:   []  Major surgery/procedure with associated risk factors (any 1):     []  Elective procedure with patient risk factors:    []   Emergent procedure:  []  IV/IM controlled substances (any 1):   []  One-time Order including Drug Name/Route, Reason Ordered:    []  Scheduled Order including Drug Name/Route, Reason Ordered or Continued:    []  PRN Order including Drug Name/Route, Reason Ordered or Continued:   ----------------------------------------------------------------------  C. Data (any 2)  []  Discussion of Management with External Professional (any 1):   []  Discussed current management and discharge planning options with Case Management.   []  Discussed management of the case with:    []  Study interpreted by me (any 1):   []  Telemetry personally  reviewed and interpreted:     []  Imaging personally reviewed and interpreted, includes:    []  Data Review (3+ points):  []  Collateral history obtained from:    []  Consultant Note reviewed with note date, specialty, and summary (1 point each):  []  Studies Reviewed (1 point each):    []  Lab Tests Reviewed (1 point each):   []  Appropriate follow-up labs were ordered:    Medications:  Personally reviewed in detail in conjunction w/ labs as documented for evidence of drug toxicity.     Infusion Medications    sodium chloride 5 mL/hr at 09/03/23 1740     Scheduled Medications    guaiFENesin  600 mg Oral BID    ipratropium 0.5 mg-albuterol 2.5 mg  1 Dose Inhalation TID    gabapentin  300 mg Oral TID    carvedilol  25 mg Oral BID WC    sodium chloride flush  5-40 mL IntraVENous 2 times per day    cefepime  2,000 mg IntraVENous Q8H    apixaban  5 mg Oral BID    linezolid  600 mg IntraVENous Q12H  valsartan  320 mg Oral Daily     PRN Meds: HYDROcodone 5 mg - acetaminophen **OR** HYDROcodone 5 mg - acetaminophen, sodium chloride flush, sodium chloride, magnesium sulfate, ondansetron **OR** ondansetron, polyethylene glycol, acetaminophen **OR** acetaminophen, labetalol, Benzocaine-Menthol, albuterol     Labs:  Personally reviewed and interpreted for clinical significance.     Recent Labs     09/02/23  1231 09/03/23  0423 09/04/23  1100   WBC 6.6 8.4 7.3   HGB 15.2 13.9 14.1   HCT 46.9 42.2 43.4   PLT 87* 76* 77*     Recent Labs     09/02/23  1231 09/04/23  0349   NA 139 136   K 4.0 4.0   CL 104 102   CO2 23 24   BUN 7 10   CREATININE 0.9 1.1   CALCIUM 9.6 8.6   PHOS  --  3.0     Recent Labs     09/02/23  1231 09/02/23  1441   PROBNP 420  --    TROPHS 13 15*     No results for input(s): "LABA1C" in the last 72 hours.  No results for input(s): "AST", "ALT", "BILIDIR", "BILITOT", "ALKPHOS" in the last 72 hours.  No results for input(s): "INR", "LACTA", "TSH" in the last 72 hours.    Urine Cultures:   Lab Results   Component  Value Date/Time    LABURIN No growth at 18 to 36 hours 04/23/2021 08:30 PM     Blood Cultures:   Lab Results   Component Value Date/Time    [facility]  09/02/2023 06:34 PM     No Growth to date.  Any change in status will be called.     Lab Results   Component Value Date/Time    BLOODCULT2  09/02/2023 07:20 PM     No Growth to date.  Any change in status will be called.     Organism:   Lab Results   Component Value Date/Time    ORG Human Rhinovirus/Enterovirus by PCR 12/26/2022 12:14 PM         Purvis Sheffield, MD

## 2023-09-04 NOTE — Progress Notes (Signed)
Patient seen and assessed for standard line care needs.  CVC site remains free of visible signs and symptoms of infection.  No drainage, edema, erythema, pain, itching, or warmth noted at and around the insertion site.  Port accessed on 09/02/23 per documentation, needle remains intact. Line care performed by Nathaniel Man, RN.  The need for continued use of the CVC is due to ongoing therapy.  Patient verbalized understanding of the line care education provided by line care RN.  Staff RNs will continue to monitor and assess the CVC site throughout the patient's hospital stay.  Sterile dressing changes will continue to be changed per policy.

## 2023-09-04 NOTE — Plan of Care (Signed)
Problem: Pain  Goal: Verbalizes/displays adequate comfort level or baseline comfort level  09/03/2023 2202 by Lerry Paterson, RN  Outcome: Progressing  Patient had no complaints of pain throughout the shift      Problem: Chronic Conditions and Co-morbidities  Goal: Patient's chronic conditions and co-morbidity symptoms are monitored and maintained or improved  09/03/2023 2202 by Lerry Paterson, RN  Outcome: Progressing     Problem: Safety - Adult  Goal: Free from fall injury  09/03/2023 2202 by Lerry Paterson, RN  Outcome: Progressing  Pt in bed with bed alarm on, instructed to call for assistance. Verbalized understanding. All fall precautions in place.      Problem: Respiratory - Adult  Goal: Achieves optimal ventilation and oxygenation  Outcome: Progressing  Patient experienced episodes of tachypnea and shortness of breathe - PRN Albuterol treatments given by Respiratory Therapy. Patient had relief shortly after receiving breathing treatments.

## 2023-09-05 LAB — RENAL FUNCTION PANEL
Albumin: 3.6 g/dL (ref 3.4–5.0)
Anion Gap: 9 (ref 3–16)
BUN: 11 mg/dL (ref 7–20)
CO2: 26 mmol/L (ref 21–32)
Calcium: 8.7 mg/dL (ref 8.3–10.6)
Chloride: 103 mmol/L (ref 99–110)
Creatinine: 1.2 mg/dL (ref 0.6–1.2)
Est, Glom Filt Rate: 47 — AB (ref >60)
Glucose: 101 mg/dL — ABNORMAL HIGH (ref 70–99)
Phosphorus: 3.3 mg/dL (ref 2.5–4.9)
Potassium: 4 mmol/L (ref 3.5–5.1)
Sodium: 138 mmol/L (ref 136–145)

## 2023-09-05 MED ORDER — AZITHROMYCIN 500 MG PO TABS
500 | ORAL_TABLET | Freq: Every day | ORAL | 0 refills | Status: AC
Start: 2023-09-05 — End: 2023-09-08

## 2023-09-05 MED ORDER — CEFDINIR 300 MG PO CAPS
300 | ORAL_CAPSULE | Freq: Two times a day (BID) | ORAL | 0 refills | Status: AC
Start: 2023-09-05 — End: 2023-09-08

## 2023-09-05 MED FILL — ALBUTEROL SULFATE (2.5 MG/3ML) 0.083% IN NEBU: RESPIRATORY_TRACT | Qty: 3

## 2023-09-05 MED FILL — IPRATROPIUM-ALBUTEROL 0.5-2.5 (3) MG/3ML IN SOLN: 0.5-2.5 (3) MG/3ML | RESPIRATORY_TRACT | Qty: 3

## 2023-09-05 MED FILL — GABAPENTIN 300 MG PO CAPS: 300 MG | ORAL | Qty: 1

## 2023-09-05 MED FILL — ZYVOX 600 MG/300ML IV SOLN: 600 MG/300ML | INTRAVENOUS | Qty: 300

## 2023-09-05 MED FILL — ELIQUIS 5 MG PO TABS: 5 MG | ORAL | Qty: 1

## 2023-09-05 MED FILL — VALSARTAN 320 MG PO TABS: 320 MG | ORAL | Qty: 1

## 2023-09-05 MED FILL — HYDROCODONE-ACETAMINOPHEN 5-325 MG PO TABS: 5-325 MG | ORAL | Qty: 1

## 2023-09-05 MED FILL — CEFEPIME HCL 2 GM/100ML IV SOLN: 2 GM/100ML | INTRAVENOUS | Qty: 100

## 2023-09-05 MED FILL — MUCINEX 600 MG PO TB12: 600 MG | ORAL | Qty: 1

## 2023-09-05 MED FILL — CARVEDILOL 25 MG PO TABS: 25 MG | ORAL | Qty: 1

## 2023-09-05 MED FILL — CHLORASEPTIC SORE THROAT 6-10 MG MT LOZG: 6-10 MG | OROMUCOSAL | Qty: 1

## 2023-09-05 NOTE — Plan of Care (Signed)
Problem: Chronic Conditions and Co-morbidities  Goal: Patient's chronic conditions and co-morbidity symptoms are monitored and maintained or improved  Outcome: Progressing  Flowsheets (Taken 09/04/2023 2000)  Care Plan - Patient's Chronic Conditions and Co-Morbidity Symptoms are Monitored and Maintained or Improved: Monitor and assess patient's chronic conditions and comorbid symptoms for stability, deterioration, or improvement     Problem: Pain  Goal: Verbalizes/displays adequate comfort level or baseline comfort level  09/05/2023 0003 by Adele Barthel, RN  Outcome: Progressing  Flowsheets  Taken 09/04/2023 2154  Verbalizes/displays adequate comfort level or baseline comfort level:   Encourage patient to monitor pain and request assistance   Assess pain using appropriate pain scale  Taken 09/04/2023 2123  Verbalizes/displays adequate comfort level or baseline comfort level:   Assess pain using appropriate pain scale   Encourage patient to monitor pain and request assistance  Taken 09/04/2023 2000  Verbalizes/displays adequate comfort level or baseline comfort level:   Encourage patient to monitor pain and request assistance   Assess pain using appropriate pain scale  09/04/2023 1601 by Filbert Berthold, RN  Outcome: Progressing  Flowsheets (Taken 09/04/2023 1601)  Verbalizes/displays adequate comfort level or baseline comfort level:   Encourage patient to monitor pain and request assistance   Implement non-pharmacological measures as appropriate and evaluate response   Assess pain using appropriate pain scale  Note: Pt encouraged to be active participant in pain management during shift. Pt c/o pain alleviated with scheduled gabapentin.      Problem: ABCDS Injury Assessment  Goal: Absence of physical injury  Outcome: Progressing     Problem: Safety - Adult  Goal: Free from fall injury  09/05/2023 0003 by Adele Barthel, RN  Outcome: Progressing  09/04/2023 1601 by Filbert Berthold, RN  Outcome: Progressing  Flowsheets (Taken  09/04/2023 1601)  Free From Fall Injury: Instruct family/caregiver on patient safety  Note: Pt is a high fall risk (see Lattie Corns Fall Risk assessment).  Oriented pt to room and call light. Instructed pt to call for help when needed.  Call light and personal items within reach.  Bed in lowest position, brakes on, and 2/4 side rails up.  Non-skid footwear on. Falls risk stop sign on door; hourly visual checks implemented.  Falls risk arm band on pt.  Bed alarm is on.  Pt using call light appropriately.  Will continue to monitor.       Problem: Cardiovascular - Adult  Goal: Maintains optimal cardiac output and hemodynamic stability  Outcome: Progressing  Flowsheets (Taken 09/04/2023 2000)  Maintains optimal cardiac output and hemodynamic stability:   Monitor blood pressure and heart rate   Monitor urine output and notify Licensed Independent Practitioner for values outside of normal range  Goal: Absence of cardiac dysrhythmias or at baseline  09/05/2023 0003 by Adele Barthel, RN  Outcome: Progressing  Flowsheets (Taken 09/04/2023 2000)  Absence of cardiac dysrhythmias or at baseline: Monitor cardiac rate and rhythm  09/04/2023 1601 by Filbert Berthold, RN  Outcome: Progressing  Flowsheets (Taken 09/04/2023 1601)  Absence of cardiac dysrhythmias or at baseline:   Monitor cardiac rate and rhythm   Assess for signs of decreased cardiac output  Note: Pt on tele an NSR during shift. MD messaged for pt crackles in lungs.      Problem: Respiratory - Adult  Goal: Achieves optimal ventilation and oxygenation  Outcome: Progressing  Flowsheets (Taken 09/04/2023 2000)  Achieves optimal ventilation and oxygenation:   Assess for changes in respiratory status   Assess for changes in mentation  and behavior     Problem: Metabolic/Fluid and Electrolytes - Adult  Goal: Electrolytes maintained within normal limits  09/05/2023 0003 by Adele Barthel, RN  Outcome: Progressing  Flowsheets (Taken 09/04/2023 2000)  Electrolytes maintained within normal limits:  Monitor labs and assess patient for signs and symptoms of electrolyte imbalances  09/04/2023 1601 by Filbert Berthold, RN  Outcome: Progressing  Flowsheets (Taken 09/04/2023 1601)  Electrolytes maintained within normal limits: Monitor labs and assess patient for signs and symptoms of electrolyte imbalances  Note:   Lab Results   Component Value Date    NA 136 09/04/2023    K 4.0 09/04/2023    CL 102 09/04/2023    CO2 24 09/04/2023   Goal: Hemodynamic stability and optimal renal function maintained  Outcome: Progressing  Flowsheets (Taken 09/04/2023 2000)  Hemodynamic stability and optimal renal function maintained:   Monitor labs and assess for signs and symptoms of volume excess or deficit   Monitor intake, output and patient weight     Problem: Skin/Tissue Integrity  Goal: Absence of new skin breakdown  Description: 1.  Monitor for areas of redness and/or skin breakdown  2.  Assess vascular access sites hourly  3.  Every 4-6 hours minimum:  Change oxygen saturation probe site  4.  Every 4-6 hours:  If on nasal continuous positive airway pressure, respiratory therapy assess nares and determine need for appliance change or resting period.  Outcome: Progressing

## 2023-09-05 NOTE — Progress Notes (Signed)
Reviewed discharge instructions with patient and  family members.  Reviewed discharge medications including dosing, schedule, indication, and adverse reactions.  Reviewed which medications were already taken today and next dosage due for each medication.      Reviewed signs and symptoms that prompt a call to the physician and appropriate phone numbers. Patient is being discharged with IV access d/t need for ongoing therapy:      Type:  PAC                        Patient verbalized understanding of all instructions and questions were answered to her. satisfaction. Patient discharged to SNF per stretcher with family members.      Bradly Bienenstock, RN

## 2023-09-05 NOTE — Care Coordination-Inpatient (Addendum)
Addendum at 10:28am:          Case Management Assessment            Discharge Note                    Date / Time of Note: 09/05/2023 10:28 AM                  Discharge Note Completed by: Pryor Ochoa MSW, LISW-S  Social Worker for [facility] and [facility] [place])  [employer]: [phone]    Patient Name: Gabrielle Wells   Date of Birth: Feb 11, 1951  Diagnosis: Weakness generalized [R53.1]  General weakness [R53.1]  Recurrent falls [R29.6]   Date / Time: 09/02/2023 12:01 PM    Current PCP: Berton Lan, MD  Clinic patient: No    Hospitalization in the last 30 days: No       Advance Directives:  Code Status: Full Code  [place] DNR form completed and on chart: Not Indicated    Financial:  Payor: [employer] MEDICARE / Plan: [employer] PLUS HMO / Product Type: *No Product type* /      Pharmacy:    [facility], [place] - [address] - [place] [phone] - F [phone]  [address] [place] [address]  Phone: [phone] Fax: [phone]    [facility] - [place], [place] - [address] Demetrius Charity [phone] Carmon Ginsberg [phone]  [address]  [place] [address]  Phone: [phone] Fax: [phone]      Assistance purchasing medications?:    Assistance provided by Case Management: None at this time    Does patient want to participate in local refill/ meds to beds program?:      Meds To Beds General Rules:  1. Can ONLY be done Monday- Friday between 8:30am-5pm  2. Prescription(s) must be in pharmacy by 3pm to be filled same day  3.Copy of patient's insurance/ prescription drug card and patient face sheet must be sent along with the prescription(s)  4. Cost of Rx cannot be added to hospital bill. If financial assistance is needed, please contact unit case manager or Child psychotherapist; Case manager or Social Worker CANNOT provide pharmacy voucher for patients co-pays  5. Patients can then pick up the prescription on their way out of the hospital at discharge, or pharmacy  can deliver to the bedside if staff is available. (payment due at time of pick-up or delivery - cash, check, or card accepted)     Able to afford home medications/ co-pay costs: Yes    ADLS:  Current PT AM-PAC Score: 17 /24  Current OT AM-PAC Score: 17 /24    DISCHARGE Disposition: Skilled Nursing Facility (SNF): [facility] Phone: [phone] Fax:      LOC at discharge: Skilled  COC Completed: Yes    Notification completed in HENS/PAS?:  Yes : CM has completed HENS online through secure website for SNF admission at [facility].   Document ID #:  [id number]    IMM Completed: CM provided and explained IMM and 4 hour window to Pt. They expressed understanding of their rights to appeal and that they may/will leave the hospital w/o having received the IMM letter 4 hours prior to DC. Pt in agreement to discharge to [facility] at 1pm. Original IMM given and copy in chart.     Transportation:  Research scientist (medical) for discharge: EMS transportation   Mode of TransportPersonnel officer -  BLS  Reason for medical transport: Other: fall risk, generalized weakness, frequent falls, SIRS criteria for sepsis, MGUS, Hypertensive Urgency   Name of Transport Company: Event organiser  Phone: [phone]  Time of Transport: 1:00pm via round trip    Transport form completed: Yes    Home Oxygen and Respiratory Equipment:  Oxygen needed at discharge?: per SNF if needed    Additional CM Notes: Discharge order noted. Received call from Laurel at [facility] stating she received precert and can admit pt today. She is aware of the 1pm pickup and will pull everything from epic. Jill Side can be reached at [phone] if needs arise prior to discharge     Spoke with pt at bedside and she called her daughter Gabrielle Wells. Updated both on the above and both stated agreement with discharge to [facility] at 1pm. Both aware if any co-pays or cost related to the transport that they would get a bill and no questions or  concerns expressed. Both denied additional needs from Clinical research associate.     Reagan RN and [employer] RN aware of the above. Transport packet at [employer]. Report number is [id number]. No other needs identified.     COVID Result:    Lab Results   Component Value Date/Time    COVID19 NOT DETECTED 09/02/2023 01:03 PM       The Plan for Transition of Care is related to the following treatment goals of Weakness generalized [R53.1]  General weakness [R53.1]  Recurrent falls [R29.6]      8:47am: Spoke with pt at bedside this AM. Pt confirmed plan for [facility] and aware insurance remains pending.     Message left for [place] liaison at [phone] requesting call back.     Pryor Ochoa MSW, LISW-S  Child psychotherapist for [employer] and Journalist, newspaper)  [employer]: [phone]

## 2023-09-05 NOTE — Progress Notes (Signed)
Occupational Therapy  Facility/Department: [facility]  Occupational Therapy Treatment    Name: Gabrielle Wells  DOB: 1951-05-01  MRN: [id number]  Date of Service: 09/05/2023    Discharge Recommendations:  Subacute/Skilled Nursing Facility  OT Equipment Recommendations  Equipment Needed: No       Patient Diagnosis(es): The primary encounter diagnosis was General weakness. A diagnosis of Recurrent falls was also pertinent to this visit.  Past Medical History:  has a past medical history of Adenocarcinoma of colon (HCC), Anesthesia, Arthritis, Asthma, CHF (congestive heart failure) (HCC), CKD (chronic kidney disease), Epidural abscess, GERD (gastroesophageal reflux disease), HTN (hypertension), Hyperlipidemia, IBS (irritable bowel syndrome), Incontinence of urine, Lumbar radiculopathy, Morbid obesity with BMI of 45.0-49.9, adult, Multiple allergies, Multiple myeloma (HCC), Muscle fasciculation, Neuropathic pain, Prolonged emergence from general anesthesia, SBO (small bowel obstruction) (HCC), Spinal fracture of T8 vertebra (HCC), Surgical site infection, Urinary incontinence, and Uses walker.  Past Surgical History:  has a past surgical history that includes Appendectomy; Breast lumpectomy (Bilateral); Hysterectomy, total abdominal; Cholecystectomy; Total knee arthroplasty (Bilateral, 2009); Cardiac catheterization (2012); Total shoulder arthroplasty w/ distal clavicle excision (Right, 2010); Abdominal hernia repair (2010); Colonoscopy; Upper gastrointestinal endoscopy (N/A, 05/26/2020); Colonoscopy (05/26/2020); Colonoscopy (05/26/2020); CT BIOPSY BONE MARROW (01/03/2021); Abdomen surgery (N/A, 05/16/2021); XR MIDLINE EQUAL OR GREATER THAN 5 YEARS (07/28/2018); Ovary removal; Colonoscopy (N/A, 11/08/2022); Port Surgery (Left, 12/11/2022); and Colonoscopy (N/A, 12/16/2022).    Treatment Diagnosis: Impaired ADL, functional mobility, functional activity tolerance      Assessment  Performance deficits /  Impairments: Decreased functional mobility ;Decreased ADL status;Decreased endurance;Decreased ROM  Assessment: Pt req increased assist with ADL today; max assist for toileting hygiene after BM, max assist to don brief.  After toileting and with toileting hygiene, pt c/o nausea, dizziness, and feeling clammy. Nurse called to room and assisted with pt back to be via Corene Cornea.  BP 127/75.  Pt felt better after being back in bed.  Nurse attended to pt.  Treatment Diagnosis: Impaired ADL, functional mobility, functional activity tolerance  Prognosis: Good  REQUIRES OT FOLLOW-UP: Yes  Activity Tolerance  Activity Tolerance: Patient limited by fatigue  Activity Tolerance Comments: After standing for toileting hygiene, pt c/o nausea and dizziness.  Nurse called and assisted.     Plan  Occupational Therapy Plan  Times Per Week: 2-5  Current Treatment Recommendations: Strengthening, ROM, Balance training, Functional mobility training, Endurance training, Self-Care / ADL    Restrictions  Position Activity Restriction  Other Position/Activity Restrictions: up with assist    Subjective  General  Chart Reviewed: Yes  Additional Pertinent Hx: Pt admitted 09/02/23 with extreme fatigue, severe generalized weakness and worsening cough, frequent falls.     CT head= No acute intracranial hemorrhage or mass effect.    CT C/T/L-spine= No acute fracture identified  Family / Caregiver Present: No  Referring Practitioner: Dr. Rudene Christians  Diagnosis: Generalized weakness  Subjective  Subjective: Upon entry, pt in bathroom with nurse, to use toilet.  Pt c/o nausea and dizziness (nurse called to room)  Pain: c/o neck pain after being on toilet several minutes, naot rated, positioned to comfort.     Social/Functional History  Social/Functional History  Lives With: Alone  Type of Home: Apartment (first floor)  Home Layout: One level  Home Access: Level entry  Bathroom Shower/Tub: Medical sales representative: Handicap height  Bathroom  Equipment: Grab bars around toilet, Grab bars in shower, Academic librarian Accessibility: Accessible  Home Equipment: [employer],  Rollator, Alert button, Reacher, Sock aid (long handled sponge)  Has the patient had two or more falls in the past year or any fall with injury in the past year?: Yes  Prior Level of Assist for ADLs: Independent (indep dressing and bathing,)  Prior Level of Assist for Homemaking: Needs assistance (aide does laundry, cooking/cleaning.)  Prior Level of Assist for Ambulation: Independent household ambulator, with or without device, Independent community ambulator, with or without device (tends to not use RW in apartment because her apt is small.)  Prior Level of Assist for Transfers: Independent  Active Driver: No  Patient's Driver Info: uber, transportation  Additional Comments: Has aide 5 days a week 5 hrs/day - cleans,    Objective     Safety Devices  Type of Devices: Left in bed;Bed alarm in place;Call light within reach;Nurse notified (Nurse attending to pt)     Copywriter, advertising - Technique: Ambulating  Equipment Used: Standard toilet (grab bar)  Toilet Transfer: Minimal assistance     ADL  LE Bathing: Maximum assistance (Pt wiped front of legs and peri area, max assist for feet and buttocks (with bath wipes))  LE Dressing: Maximum assistance (to don brief)  Putting On/Taking Off Footwear: Moderate assistance (to don socks)  Putting On/Taking Off Footwear Skilled Clinical Factors: Pt uses a sock aid at home  Toileting: Maximum assistance (for toileting hygiene after BM)  Product Used : Bath wipes;Soap and water        Bed mobility  Sit to Supine: Minimal assistance (assist for legs)  Transfers  Sit to stand: Minimal assistance (to CG toilet to walker and Corene Cornea)  Stand to sit: Contact guard assistance  Transfer Comments: Corene Cornea from bathroom to bed secondary to feeling woozy.     Cognition  Overall Cognitive Status: WFL  Orientation  Overall Orientation  Status: Within Functional Limits                  Education Given To: Patient  Education Provided: Role of Therapy;Plan of Armed forces operational officer  Education Method: Verbal;Demonstration  Barriers to Learning: None  Education Outcome: Verbalized understanding;Continued education needed;Demonstrated understanding              AM-PAC - ADL  AM-PAC Daily Activity - Inpatient   How much help is needed for putting on and taking off regular lower body clothing?: A Lot  How much help is needed for bathing (which includes washing, rinsing, drying)?: A Lot  How much help is needed for toileting (which includes using toilet, bedpan, or urinal)?: A Lot  How much help is needed for putting on and taking off regular upper body clothing?: A Little  How much help is needed for taking care of personal grooming?: A Little  How much help for eating meals?: A Little  AM-PAC Inpatient Daily Activity Raw Score: 15  AM-PAC Inpatient ADL T-Scale Score : 34.69  ADL Inpatient CMS 0-100% Score: 56.46  ADL Inpatient CMS G-Code Modifier : CK    Goals                           No goals met  Short Term Goals  Time Frame for Short Term Goals: Discharge  Short Term Goal 1: Transfer to/from toilet with supervision  Short Term Goal 2: Stance with supervision x5 min while engaging in ADL  Short Term Goal 3: Complete toileting with supervision  Patient Goals  Patient goals : Go home      Therapy Time   Individual Concurrent Group Co-treatment   Time In 1055         Time Out 1125         Minutes 30         Timed Code Treatment Minutes: 30 Minutes   Total Treatment Time:30    Derrill Kay, OTR/L [phone]

## 2023-09-05 NOTE — Discharge Instructions (Addendum)
Continuity of Care Form    Patient Name: Gabrielle Wells   DOB:  [birth year]  MRN:  [id number]    Admit date:  09/02/2023  Discharge date:  ***    Code Status Order: Full Code   Advance Directives:   Advance Care Flowsheet Documentation             Admitting Physician:  Waymond Cera, MD  PCP: Berton Lan, MD    Discharging Nurse: [facility] Unit/Room#: 3513/3513-01  Discharging Unit Phone Number: ***    Emergency Contact:   Extended Emergency Contact Information  Primary Emergency Contact: [facility]  Home Phone: [phone]  Mobile Phone: [phone]  Relation: Brother/Sister  Secondary Emergency Contact: smith,ed  Home Phone: [phone]  Mobile Phone: [phone]  Relation: Brother/Sister  Interpreter needed? No    Past Surgical History:  Past Surgical History:   Procedure Laterality Date    ABDOMEN SURGERY N/A 05/16/2021    ABDOMINAL FAT PAD EXCISIONAL BIOPSY performed by Lucky Cowboy, MD at [facility]    ABDOMINAL HERNIA REPAIR  2010    APPENDECTOMY      BREAST LUMPECTOMY Bilateral     benign lesions    CARDIAC CATHETERIZATION  2012    CHOLECYSTECTOMY      COLONOSCOPY      COLONOSCOPY  05/26/2020    COLONOSCOPY POLYPECTOMY SNARE/COLD BIOPSY performed by Adella Nissen, MD at [facility] ENDOSCOPY    COLONOSCOPY  05/26/2020    COLONOSCOPY WITH BIOPSY performed by Adella Nissen, MD at [facility] ENDOSCOPY    COLONOSCOPY N/A 11/08/2022    COLONOSCOPY WITH BIOPSY, COLD SNARE/HOT SNARE performed by Adella Nissen, MD at [facility] ENDOSCOPY    COLONOSCOPY N/A 12/16/2022    COLONOSCOPY BIOPSY performed by Adella Nissen, MD at [facility] ENDOSCOPY    CT BIOPSY BONE MARROW  01/03/2021    CT BONE MARROW BIOPSY 01/03/2021 [facility] CT SCAN    HYSTERECTOMY, TOTAL ABDOMINAL (CERVIX REMOVED)      OVARY REMOVAL      PORT SURGERY Left 12/11/2022    LEFT SUBCLAVIAN PORT PLACEMENT performed by Kizzie Furnish, MD at [facility] OR    TOTAL KNEE ARTHROPLASTY Bilateral 2009    TOTAL SHOULDER ARTHROPLASTY W/ DISTAL CLAVICLE  EXCISION Right 2010    UPPER GASTROINTESTINAL ENDOSCOPY N/A 05/26/2020    EGD BIOPSY performed by Adella Nissen, MD at [facility] ENDOSCOPY    XR MIDLINE EQUAL OR GREATER THAN 5 YEARS  07/28/2018    XR MIDLINE EQUAL OR GREATER THAN 5 YEARS 07/28/2018       Immunization History:   Immunization History   Administered Date(s) Administered    COVID-19, J&J, (age 18y+), IM, 0.5 mL 11/26/2019    COVID-19, MODERNA BLUE border, Primary or Immunocompromised, (age 12y+), IM, 100 mcg/0.56mL 11/04/2020    COVID-19, MODERNA Bivalent, (age 12y+), IM, 50 mcg/0.5 mL 08/06/2021    Influenza Virus Vaccine 04/15/2016, 05/13/2017    Influenza, FLUAD, (age 86 y+), IM, Quadv, 0.73mL 07/05/2021    Influenza, FLUARIX, FLULAVAL, FLUZONE (age 40 mo+) and AFLURIA, (age 71 y+), Quadv PF, 0.39mL 10/15/2019    Pneumococcal Vaccine 12/18/2014    Pneumococcal, PPSV23, PNEUMOVAX 23, (age 2y+), SC/IM, 0.71mL 10/27/2018, 09/08/2019    TDaP, ADACEL (age 27y-64y), BOOSTRIX (age 10y+), IM, 0.60mL 09/08/2019       Active Problems:  Patient Active Problem List   Diagnosis Code    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    Resistant  hypertension I1A.0    GERD (gastroesophageal reflux disease) K21.9    Back wound S21.209A    AKI (acute kidney injury) (HCC) N17.9    CKD (chronic kidney disease), 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    Vitamin D deficiency E55.9    Hyperlipidemia E78.5    Morbid obesity E66.01    BPPV (benign paroxysmal positional vertigo) H81.10    Fall at home, initial encounter W19.XXXA, Y92.009    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    Suspected condition R69    Acute blood loss anemia D62    Backache M54.9    Severe hypertension I10    Chronic pain disorder G89.4    Community acquired pneumonia J18.9    CSF leak G96.00    Depression F32.A    Diarrhea R19.7    Discitis of lumbosacral region M46.47    Elevated lipase R74.8    Fever and chills R50.9    Fracture of neck of left humerus, closed,  initial encounter S42.212A    Fracture of T10 vertebra (HCC) S22.079A    Generalized anxiety disorder F41.1    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    Proteus infection A49.8    Humerus fracture S42.309A    Hypotensive episode I95.9    Intertrigo L30.4    Obstipation K59.00    Klebsiella infection A49.8    Labile blood pressure R09.89    Leukopenia D72.819    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, 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    Neuropathic pain M79.2    Obesity hypoventilation syndrome E66.2    Other specified anemias D64.89    Physical deconditioning R53.81    Pill dysphagia R13.10    Postoperative pain G89.18    Protein-calorie malnutrition, severe (HCC) E43    Pruritic condition L29.9    Zoster B02.9    Wound infection after surgery T81.49XA    CHF (congestive heart failure) (HCC) I50.9    Multiple myeloma (HCC) C90.00    Weakness generalized R53.1       Isolation/Infection:   Isolation            No Isolation          Patient Infection Status       Infection Onset Added Last Indicated Last Indicated By Review Planned Expiration Resolved Resolved By    None active    Resolved    Rhinovirus 12/26/22 12/26/22 12/26/22 Respiratory Panel, Molecular, with COVID-19 (Restricted: peds pts or suitable admitted adults)   01/05/23 Infection Expired                       Nurse Assessment:  Last Vital Signs: BP 128/89   Pulse 83   Temp 97.5 F (36.4 C) (Oral)   Resp 17   Ht 1.6 m (5\' 3" )   Wt 104.3 kg (230 lb)   SpO2 98%   BMI 40.74 kg/m     Last documented pain score (0-10 scale): Pain Level: 0  Last Weight:   Wt Readings from Last 1 Encounters:   09/05/23 104.3 kg (230 lb)     Mental Status:  {IP PT MENTAL STATUS:20030}    IV Access:  {MH COC IV ACCESS:304088262}    Nursing Mobility/ADLs:  Walking   {CHP DME NUUV:253664403}  Transfer  {CHP DME KVQQ:595638756}  Bathing  {CHP DME EPPI:951884166}  Dressing  {CHP DME AYTK:160109323}  Toileting  {CHP  DME MVHQ:469629528}  Feeding   {CHP DME UXLK:440102725}  Med Admin  {CHP DME DGUY:403474259}  Med Delivery   {MH COC MED Delivery:304088264}    Wound Care Documentation and Therapy:  Incision 01/03/21 Sacrum (Active)   Number of days: 975       Incision 05/16/21 Abdomen (Active)   Number of days: 842       Incision 12/11/22 Chest Left (Active)   Number of days: 268        Elimination:  Continence:   Bowel: {YES / DG:38756}  Bladder: {YES / EP:32951}  Urinary Catheter: {Urinary Catheter:304088013}   Colostomy/Ileostomy/Ileal Conduit: {YES / OA:41660}       Date of Last BM: ***    Intake/Output Summary (Last 24 hours) at 09/05/2023 0922  Last data filed at 09/04/2023 2200  Gross per 24 hour   Intake 886 ml   Output 1650 ml   Net -764 ml     I/O last 3 completed shifts:  In: 886 [P.O.:60; I.V.:826]  Out: 2550 [Urine:2550]    Safety Concerns:     {MH COC Safety Concerns:304088272}    Impairments/Disabilities:      {MH COC Impairments/Disabilities:304088273}    Nutrition Therapy:  Current Nutrition Therapy:   {MH COC Diet List:304088271}    Routes of Feeding: {CHP DME Other Feedings:304088042}  Liquids: {Slp liquid thickness:30034}  Daily Fluid Restriction: {CHP DME Yes amt example:304088041}  Last Modified Barium Swallow with Video (Video Swallowing Test): {Done Not Done YTKZ:601093235}    Treatments at the Time of Hospital Discharge:   Respiratory Treatments: ***  Oxygen Therapy:  {Therapy; copd oxygen:17808}  Ventilator:    {MH CC Vent TDDU:202542706}    Heart Failure Instructions for Daily Management  Patient was treated for chronic diastolic heart failure.  she  will require the following:    Please weigh daily on the same scale and approximately the same time of day.  Report weight gain of 3 pounds/day or 5 pounds/week to : facility MD, Berton Lan, MD [phone], and [facility] / [place] [phone].  Please use hospital discharge weight as baseline reference.   Please monitor for signs and symptoms of and report to  MD:  Worsening Heart Failure: sudden weight gain, shortness of breath, lower extremity or general edema/swelling, abdominal bloating/swelling, inability to lie flat, intolerance to usual activity, or cough (especially at night). Report these finding even if no increase in weight.  Dehydration:  having difficulty or a decrease in urination, dizziness, worsening fatigue, or new onset/worsening of generalized weakness.     Please continue a LOW SODIUM diet and LIMIT fluid intake to 48 - 64 ounces ( 1.5 - 2 liters) per day.     Call Berton Lan, MD [phone], facility MD, and [facility] [place] [phone] with any questions or concerns.   Please continue heart failure education to patient and family/support system.  See After Visit Summary for hospital follow up appointment details.  Consider spiritual care referral for support and/or completion of advance directives .  Consider: having the facility MD complete required 7 day follow up, [facility] telehealth program if patient agreeable and able to participate, and palliative care consult for ongoing goals of care, end of life, and/or chronic disease management discussions.  Patient's primary cardiologist is Dr Harrison Mons.        Rehab Therapies: {THERAPEUTIC INTERVENTION:[phone]}  Weight Bearing Status/Restrictions: {MH CC Weight Bearing:304508812}  Other Medical Equipment (for information only, NOT  a DME order):  {EQUIPMENT:304520077}  Other Treatments: ***    Patient's personal belongings (please select all that are sent with patient):  {CHP DME Belongings:304088044}    RN SIGNATURE:  {Esignature:304088025}    CASE MANAGEMENT/SOCIAL WORK SECTION    Inpatient Status Date: 09/02/2023    Readmission Risk Assessment Score:  [facility] RISK OF UNPLANNED READMISSION 2.0             14 Total Score        Discharging to Facility/ Agency   Name: [employer] [facility] [employer]   Address:  Phone:[phone]  Fax:    Case Manager/Social Worker signature:  Electronically signed by Penni Homans,  MSW, LISW-S on 09/05/23 at 10:20 AM EST    PHYSICIAN SECTION    Prognosis: Fair    Condition at Discharge: Stable    Rehab Potential (if transferring to Rehab): Fair    Recommended Labs or Other Treatments After Discharge: none    Physician Certification: I certify the above information and transfer of Gabrielle Wells  is necessary for the continuing treatment of the diagnosis listed and that she requires Skilled Nursing Facility for less than 30 days.     Update Admission H&P: No change in H&P    PHYSICIAN SIGNATURE:  Electronically signed by Purvis Sheffield, MD on 09/05/23 at 9:22 AM EST

## 2023-09-05 NOTE — Progress Notes (Signed)
Pt was working with occupational therapy in bathroom and reported feeling lightheaded, dizzy. Pt was returned to bed via sara stedy stating that she felt like she could pass out. Vital signs stable. MD Pavan made aware, no new orders at this time.

## 2023-09-05 NOTE — Plan of Care (Signed)
Problem: Chronic Conditions and Co-morbidities  Goal: Patient's chronic conditions and co-morbidity symptoms are monitored and maintained or improved  09/05/2023 1048 by Cuthbert Turton, RN  Outcome: Adequate for Discharge     Problem: Pain  Goal: Verbalizes/displays adequate comfort level or baseline comfort level  09/05/2023 1048 by Kaeden Mester, RN  Outcome: Adequate for Discharge     Problem: ABCDS Injury Assessment  Goal: Absence of physical injury  09/05/2023 1048 by Kelvin Burpee, RN  Outcome: Adequate for Discharge  Flowsheets (Taken 09/05/2023 0011 by Adele Barthel, RN)  Absence of Physical Injury: Implement safety measures based on patient assessment     Problem: Safety - Adult  Goal: Free from fall injury  09/05/2023 1048 by Latroy Gaymon, RN  Outcome: Adequate for Discharge  Flowsheets (Taken 09/05/2023 0011 by Adele Barthel, RN)  Free From Fall Injury: Instruct family/caregiver on patient safety     Problem: Cardiovascular - Adult  Goal: Maintains optimal cardiac output and hemodynamic stability  09/05/2023 1048 by Chassie Pennix, RN  Outcome: Adequate for Discharge     Problem: Cardiovascular - Adult  Goal: Absence of cardiac dysrhythmias or at baseline  09/05/2023 1048 by Waylon Koffler, RN  Outcome: Adequate for Discharge     Problem: Respiratory - Adult  Goal: Achieves optimal ventilation and oxygenation  09/05/2023 1048 by Matika Bartell, RN  Outcome: Adequate for Discharge       Problem: Metabolic/Fluid and Electrolytes - Adult  Goal: Electrolytes maintained within normal limits  09/05/2023 1048 by Latona Krichbaum, RN  Outcome: Adequate for Discharge     Problem: Skin/Tissue Integrity  Goal: Absence of new skin breakdown  Description: 1.  Monitor for areas of redness and/or skin breakdown  2.  Assess vascular access sites hourly  3.  Every 4-6 hours minimum:  Change oxygen saturation probe site  4.  Every 4-6 hours:  If on nasal continuous positive airway pressure, respiratory therapy assess nares and determine need for  appliance change or resting period.  09/05/2023 1048 by Bradly Bienenstock, RN  Outcome: Adequate for Discharge

## 2023-09-05 NOTE — Discharge Summary (Signed)
V2.0  Discharge Summary    Name:  Gabrielle Wells DOB/Age/Sex: 08-29-1950 (73 y.o. 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 female with pmh as mentioned above presents with complaints of extreme generalized weakness and frequent falls for at least past 2 weeks.  Patient is unable to ambulate currently.  Initially patient had a fever of 100 F, later on she developed high-grade fever  Patient has hoarse voice due to worsening cough over the past 2 weeks.  Denies SOB or chest pain.  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  Receives once every month and last dose is about a week ago.  Patient's blood pressure was significantly elevated on admission and was tachycardic as well  Denies weight gain, leg swelling, orthopnea"    Hospital course:  Patient improved with antiobiotic therapy. She never required supp O2. She tested negative for flu and COVID. Blood cultures negative. SIRS negative for 48 hours prior to discharge.     The patient expressed appropriate understanding of, and agreement with the discharge recommendations, medications, and plan.     Consults this admission:  None    Discharge Instructions:   Follow up appointments: oncology, PCP  Primary care physician: Berton Lan, MD within 2 weeks  Diet: diabetic diet   Activity: activity as tolerated  Disposition: Discharged to:   [] Home, [] HHC, [x] SNF, [] Acute Rehab, [] Hospice   Condition on discharge: Stable  Labs and Tests to be Followed up as an outpatient by PCP  or Specialist: none    Discharge Medications:        Medication List        START taking these medications      azithromycin 500 MG tablet  Commonly known as: ZITHROMAX  Take 1 tablet by mouth daily for 3 days     cefdinir 300 MG capsule  Commonly known as: OMNICEF  Take 1 capsule by mouth 2 times daily for 3 days            CHANGE how you take these medications      carvedilol 25 MG tablet  Commonly known as: COREG  TAKE ONE TABLET BY MOUTH TWICE DAILY  What changed:   how much to take  when to take this     furosemide 20 MG tablet  Commonly known as: Lasix  Take 2 tablets by mouth daily Take 80 mg if wt goes up 3-5lbs  What changed:   how much to take  when to take this  reasons to take this     gabapentin 300 MG capsule  Commonly known as: NEURONTIN  TAKE ONE CAPSULE BY MOUTH 3 TIMES DAILY  What changed: See the new instructions.     vitamin D 25 MCG (1000 UT) Tabs tablet  Commonly known as: CHOLECALCIFEROL  TAKE TWO TABLETS BY MOUTH DAILY  What changed: when to take this  CONTINUE taking these medications      acetaminophen 500 MG tablet  Commonly known as: TYLENOL     acyclovir 400 MG tablet  Commonly known as: ZOVIRAX     albuterol sulfate HFA 108 (90 Base) MCG/ACT inhaler  Commonly known as: Ventolin HFA  Inhale 2 puffs into the lungs 4 times daily as needed for Wheezing     apixaban 5 MG Tabs tablet  Commonly known as: ELIQUIS     atorvastatin 40 MG tablet  Commonly known as: LIPITOR  Take 1 tablet by mouth daily     cyclobenzaprine 5 MG tablet  Commonly known as: FLEXERIL     omeprazole 20 MG delayed release capsule  Commonly known as: PRILOSEC  Take 1 capsule by mouth every morning (before breakfast)     ondansetron 4 MG disintegrating tablet  Commonly known as: ZOFRAN-ODT  DISSOLVE 1 TABLET ON THE TONGUE EVERY 8 HOURS AS NEEDED FOR NAUSEA     oxyCODONE 5 MG immediate release tablet  Commonly known as: ROXICODONE     valsartan 320 MG tablet  Commonly known as: DIOVAN  Take 1 tablet by mouth  daily            STOP taking these medications      predniSONE 50 MG tablet  Commonly known as: DELTASONE            ASK your doctor about these medications      Revlimid 25 MG chemo capsule  Generic drug: lenalidomide               Where to Get Your Medications        These medications were sent to [facility], [place] - [address] [place] [address] - P [phone] Carmon Ginsberg [phone]  [address] [place] [address]      Phone: [phone]   azithromycin 500 MG tablet  cefdinir 300 MG capsule        Objective Findings at Discharge:   BP 107/72   Pulse 76   Temp 98.2 F (36.8 C) (Oral)   Resp 18   Ht 1.6 m (5\' 3" )   Wt 104.3 kg (230 lb)   SpO2 96%   BMI 40.74 kg/m       Physical Exam:   General: NAD  Eyes: EOMI  ENT: neck supple  Cardiovascular: Regular rate.  Respiratory: Clear to auscultation  Gastrointestinal: Soft, non tender  Genitourinary: no suprapubic tenderness  Musculoskeletal: No edema  Skin: warm, dry  Neuro: Alert.  Psych: Mood appropriate.         Labs and Imaging   CT LUMBAR RECONSTRUCTION WO POST PROCESS    Result Date: 09/02/2023  CT LUMBAR RECONSTRUCTION WO POST PROCESS INDICATION: Fall. Back pain. COMPARISON: 07/12/2022 TECHNIQUE: Multiplanar CT images of the lumbar spine obtained from reconstructions of the abdomen and pelvis data set. Up-to-date CT equipment and radiation dose reduction techniques were employed. FINDINGS: Motion artifact somewhat compromises bone detail. Prior body fusion at L4-L5 and laminectomies at L4-L5 and L5-S1. At least partial bony fusion across posterior elements from about L3-S1. No vertebral compression deformity. No definite acute fracture. Round lucent lesion within the L4 vertebral body and S1 vertebral body remain unchanged from multiple prior studies. Severe multilevel degenerative changes. At least moderate if not severe canal stenosis at L2-L3. Moderate to severe left L1 foraminal stenosis. Moderate bilateral L2 foraminal stenosis. Severe  right and mild to moderate left L3 foraminal stenosis. Mild right L4 foraminal stenosis.  Moderate right and severe left L5 foraminal stenosis.     No acute fracture identified in the lumbar spine. Electronically signed by Renato Battles    CT THORACIC RECONSTRUCTION WO POST PROCESS    Result Date: 09/02/2023  CT THORACIC RECONSTRUCTION WO POST PROCESS INDICATION: Fall. Back pain. COMPARISON: 12/27/2022 TECHNIQUE: Multiplanar CT images of the thoracic spine obtained from reconstruction of the CT chest data set. Up-to-date CT equipment and radiation dose reduction techniques were employed. FINDINGS: Osseous detail somewhat compromised by patient motion. Thoracic spine alignment is maintained. No vertebral compression deformity. No definite acute fracture. Paravertebral soft tissues are unremarkable. Multilevel degenerative changes. Prominent disc osteophyte complex causing moderate stenosis at T11-T12 variable neural foraminal stenosis, and for example, severe bilateral T10 foraminal stenosis, and right and moderate left T11 foraminal stenosis.     No acute fracture identified in the thoracic spine. Electronically signed by Renato Battles    CT CHEST ABDOMEN PELVIS W CONTRAST Additional Contrast? None    Result Date: 09/02/2023  CT CHEST ABDOMEN PELVIS W CONTRAST INDICATION: Fall. Chest pain. Abdominal pain. COMPARISON: 06/30/2023 TECHNIQUE:  CT of the chest, abdomen and pelvis was performed with intravenous contrast. Coronal and sagittal reconstructions were created. Up-to-date CT equipment and radiation dose reduction techniques were employed. IV Contrast: 75 mL Isovue-370 FINDINGS: CHEST: LUNGS AND AIRWAYS: Respiratory motion compromises detail of the lungs. Central airways are patent. Mild airspace disease/atelectasis in the basal lower lobes bilaterally. PLEURA: No effusion or significant pleural thickening. MEDIASTINUM: Heart is borderline in size. No pericardial effusion. Normal caliber aorta. Atherosclerosis in the aorta  and coronary arteries. Right IJ chest port tip at cavoatrial junction no lymphadenopathy. LYMPH NODES: No significant lymphadenopathy CHEST WALL/LOWER NECK: No significant findings identified. BONES: The thoracic spine is separately reported. No acute findings identified. ABDOMEN AND PELVIS: LIVER: Grossly normal within limits of motion and streak artifact. GALLBLADDER AND BILIARY TREE: Gallbladder not seen, presumed surgically absent. No significant ductal dilation. PANCREAS: Grossly normal within limits of motion and streak artifacts. SPLEEN: Grossly normal. ADRENALS: Grossly normal. KIDNEYS: No hydronephrosis. Cyst in the right kidney which does not specifically warrant follow-up. BLADDER: Normal. REPRODUCTIVE ORGANS: Prior hysterectomy. BOWEL: Normal caliber. Diverticulosis in the colon. LYMPH NODES: No significant lymphadenopathy. PERITONEUM: No free air or free fluid VESSELS: Normal caliber aorta. ABDOMINAL WALL: Post ventral abdominal wall repair with mesh. BONES: The lumbar spine is separately reported. No acute findings identified.     CHEST: 1.  Motion compromised study. 2.  Mild bibasilar airspace disease/atelectasis. 3.  No other convincing acute abnormality seen. ABDOMEN AND PELVIS: 1.  Motion compromised study. 2.  No convincing acute abnormality seen. Electronically signed by Renato Battles    CT CERVICAL SPINE WO CONTRAST    Result Date: 09/02/2023  CT CERVICAL SPINE WO CONTRAST INDICATION: Fall. Neck pain. COMPARISON: 08/22/2023 TECHNIQUE: Contiguous axial CT was performed of the cervical spine. Coronal and sagittal reformations were created. Up-to-date CT equipment and radiation dose reduction techniques were employed. FINDINGS: Bone detail is somewhat limited by patient motion and body habitus. 1 mm grade 1 nausea stasis of see foreign C5. Reversal of normal cervical lordosis. No significant prevertebral swelling. The craniocervical junction and atlantoaxial joint appear intact. No evidence of cervical  spine fracture. Multilevel degenerative disease and degenerative disease unchanged from recent comparison.     No evidence of cervical spine fracture. Electronically signed by Renato Battles    CT HEAD WO CONTRAST    Result Date: 09/02/2023  CT HEAD WO CONTRAST INDICATION:  Headache after fall. COMPARISON: 08/22/2023 TECHNIQUE:  CT head was performed without intravenous contrast. Coronal and sagittal reformations were created. Up-to-date CT equipment and radiation dose reduction techniques were employed. FINDINGS: No acute intracranial hemorrhage or extra-axial fluid collection. No mass effect or midline shift. Gray-white matter differentiation is maintained. Mild nonspecific periventricular and deep white matter hypoattenuation compatible with chronic small vessel ischemia. Ventricles are normal in size for age. Basal cisterns are patent. Calvarium is intact. Visualized paranasal sinuses and mastoid air cells are clear.     No acute intracranial hemorrhage or mass effect. Electronically signed by Renato Battles    XR HUMERUS RIGHT (MIN 2 VIEWS)    Result Date: 09/02/2023  INDICATION: Trauma. Pain. RIGHT HUMERUS 2 views were performed. No comparison. Bones appear mildly osteopenic. Evidence of right humeral proximal arthroplasty. No evidence of acute fracture. No dislocation identified. Narrowed glenohumeral space identified a moderate narrowing of the acromiohumeral space noted with mild to moderate hypertrophy of the acromioclavicular joint. No pathologic calcification identified. Soft tissues are unremarkable.     1.  Right humeral arthroplasty. Associated mild to moderate right shoulder deformity likely related to degenerative process, possible rotator cuff abnormality. 2.  No evidence of acute fracture. Electronically signed by MD Vallarie Mare      CBC:   Recent Labs     09/02/23  1231 09/03/23  0423 09/04/23  1100   WBC 6.6 8.4 7.3   HGB 15.2 13.9 14.1   PLT 87* 76* 77*     BMP:    Recent Labs     09/02/23  1231  09/04/23  0349 09/05/23  0358   NA 139 136 138   K 4.0 4.0 4.0   CL 104 102 103   CO2 23 24 26    BUN 7 10 11    CREATININE 0.9 1.1 1.2   GLUCOSE 138* 119* 101*     Hepatic: No results for input(s): "AST", "ALT", "BILITOT", "ALKPHOS" in the last 72 hours.    Invalid input(s): "ALB"  Lipids:   Lab Results   Component Value Date/Time    CHOL 224 08/04/2019 02:17 PM    HDL 62 08/04/2019 02:17 PM    TRIG 124 08/04/2019 02:17 PM     Hemoglobin A1C:   Lab Results   Component Value Date/Time    LABA1C 5.7 08/04/2019 02:17 PM     TSH:   Lab Results   Component Value Date/Time    TSH 1.60 12/20/2020 10:59 AM     Troponin: No results found for: "TROPONINT"  Lactic Acid: No results for input(s): "LACTA" in the last 72 hours.  BNP:   Recent Labs     09/02/23  1231   PROBNP 420     UA:  Lab Results   Component Value Date/Time    NITRU Negative 09/02/2023 12:31 PM    COLORU Yellow 09/02/2023 12:31 PM    PHUR 7.5 09/02/2023 12:31 PM    PHUR 6.0 07/12/2022 05:05 PM    WBCUA 6-9 08/22/2023 09:09 PM    RBCUA 5-10 08/22/2023 09:09 PM    MUCUS 2+ 08/22/2023 09:09 PM    BACTERIA 4+ 08/22/2023 09:09 PM    CLARITYU Clear 09/02/2023 12:31 PM    LEUKOCYTESUR Negative 09/02/2023 12:31 PM    UROBILINOGEN 0.2 09/02/2023 12:31 PM    BILIRUBINUR Negative 09/02/2023 12:31 PM    BLOODU Negative 09/02/2023 12:31 PM    GLUCOSEU Negative 09/02/2023 12:31 PM    KETUA Negative 09/02/2023 12:31 PM  AMORPHOUS 3+ 07/12/2022 05:05 PM     Urine Cultures:   Lab Results   Component Value Date/Time    LABURIN No growth at 18 to 36 hours 04/23/2021 08:30 PM     Blood Cultures:   Lab Results   Component Value Date/Time    [facility]  09/02/2023 06:34 PM     No Growth to date.  Any change in status will be called.     Lab Results   Component Value Date/Time    BLOODCULT2  09/02/2023 07:20 PM     No Growth to date.  Any change in status will be called.     Organism:   Lab Results   Component Value Date/Time    ORG Human Rhinovirus/Enterovirus by PCR 12/26/2022 12:14 PM        Time Spent Discharging patient 45 minutes    Electronically signed by Purvis Sheffield, MD on 09/05/2023 at 11:09 AM

## 2023-09-05 NOTE — Progress Notes (Signed)
Report called to [facility], report given to transporters and pt en route to facility.

## 2023-09-06 LAB — CULTURE, MRSA, SCREENING

## 2023-09-07 LAB — CULTURE, BLOOD 1: Blood Culture, Routine: NO GROWTH

## 2023-09-07 LAB — CULTURE, BLOOD 2: Culture, Blood 2: NO GROWTH

## 2023-10-01 ENCOUNTER — Encounter: Payer: MEDICARE | Attending: Nurse Practitioner | Primary: Family Medicine

## 2023-10-02 ENCOUNTER — Encounter: Payer: MEDICARE | Attending: Nurse Practitioner | Primary: Family Medicine

## 2023-10-22 ENCOUNTER — Encounter: Payer: MEDICARE | Attending: Cardiovascular Disease | Primary: Family Medicine

## 2023-11-27 ENCOUNTER — Encounter: Payer: MEDICARE | Attending: Nephrology | Primary: Family Medicine

## 2023-12-02 ENCOUNTER — Ambulatory Visit: Admit: 2023-12-02 | Discharge: 2023-12-02 | Payer: MEDICARE | Attending: Nephrology | Primary: Family Medicine

## 2023-12-02 VITALS — BP 144/83 | HR 66 | Wt 244.6 lb

## 2023-12-02 DIAGNOSIS — N183 Chronic kidney disease, stage 3 unspecified (HCC): Secondary | ICD-10-CM

## 2023-12-02 MED ORDER — VALSARTAN 80 MG PO TABS
80 | ORAL_TABLET | Freq: Every day | ORAL | 5 refills | 30.00000 days | Status: DC
Start: 2023-12-02 — End: 2024-03-11

## 2023-12-02 NOTE — Progress Notes (Signed)
 Pauletta Boroughs MD   Tino Foreman MD FASN [facility]       [facility]      Nephrology Consult Note    Reason for Consult:  AKI     Chief Complaint:  CHF   History Obtained From:  Patient     12/02/23     Pt has CKD 3   Diagnosed with MM on chemo - monthly treatments   GFR in good range   **Trying to get labs from PCP office     Appetite is fair     Has chronic CHF  Off Jardiance since September 2024    BP's higher today   BP had been running lower so stopped Valsartan   No dizziness  No lightheadedness     Weight up  On Lasix PRN - recently taking it   Feels bloated     Pt had a bone marrow biopsy - diagnosed with MM now    Pt had a PET scan done   No amyloid on fat pad biopsy       Wt Readings from Last 3 Encounters:   12/02/23 110.9 kg (244 lb 9.6 oz)   09/05/23 104.3 kg (230 lb)   08/22/23 109.8 kg (242 lb)       History of Present Illness:    This is a 73 y.o. female who presents to the office for evaluation of  AKI on CKD 3   Pt has CHF was on diuretics   Off now for a week   BNP 200    BP low   On high dose Neurontin           Pt denies any hx of heavy or prolonged NSAID use.   There is no hx of jaundice or hepatitis or sexually transmitted disease.   Pt has no hx of collagen vascular disease or vasculitis.  No history of dysuria or frequency.No recent procedures involving IV contrast.   There is no hx of paraprotein disease.   Pt denies any hx of recurrent UTI , incontinence or nocturia or recurrent nephrolithiasis. No unusual skin rashes .   No tea coloured urine .   Medication reviewed and noted the use of ace/arb, diuretic.    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    s/i I&D and removaal of hardware    GERD (gastroesophageal reflux  disease)     HTN (hypertension)     Hyperlipidemia     IBS (irritable bowel syndrome)     Incontinence of urine     Lumbar radiculopathy     Morbid obesity with BMI of 45.0-49.9, adult     Multiple allergies     Multiple myeloma (HCC)     Muscle fasciculation     Neuropathic pain     back 2/2 to spinal surgeries    Prolonged emergence from general anesthesia     SBO (small bowel obstruction) (HCC) 07/1999    with partial colectomy    Spinal fracture of T8 vertebra (HCC)     s/p fusion     Surgical site infection  back, spinal hardware     Urinary incontinence     Uses walker        Past Surgical History:        Procedure Laterality Date    ABDOMEN SURGERY N/A 05/16/2021    ABDOMINAL FAT PAD EXCISIONAL BIOPSY performed by Lucky Cowboy, MD at [facility] OR    ABDOMINAL HERNIA REPAIR  2010    APPENDECTOMY      BREAST LUMPECTOMY Bilateral     benign lesions    CARDIAC CATHETERIZATION  2012    CHOLECYSTECTOMY      COLONOSCOPY      COLONOSCOPY  05/26/2020    COLONOSCOPY POLYPECTOMY SNARE/COLD BIOPSY performed by Adella Nissen, MD at [facility] ENDOSCOPY    COLONOSCOPY  05/26/2020    COLONOSCOPY WITH BIOPSY performed by Adella Nissen, MD at [facility] ENDOSCOPY    COLONOSCOPY N/A 11/08/2022    COLONOSCOPY WITH BIOPSY, COLD SNARE/HOT SNARE performed by Adella Nissen, MD at [facility] ENDOSCOPY    COLONOSCOPY N/A 12/16/2022    COLONOSCOPY BIOPSY performed by Adella Nissen, MD at [facility] ENDOSCOPY    CT BIOPSY BONE MARROW  01/03/2021    CT BONE MARROW BIOPSY 01/03/2021 [facility] CT SCAN    HYSTERECTOMY, TOTAL ABDOMINAL (CERVIX REMOVED)      OVARY REMOVAL      PORT SURGERY Left 12/11/2022    LEFT SUBCLAVIAN PORT PLACEMENT performed by Kizzie Furnish, MD at [facility] OR    TOTAL KNEE ARTHROPLASTY Bilateral 2009    TOTAL SHOULDER ARTHROPLASTY W/ DISTAL CLAVICLE EXCISION Right 2010    UPPER GASTROINTESTINAL ENDOSCOPY N/A 05/26/2020    EGD BIOPSY performed by Adella Nissen, MD at [facility] ENDOSCOPY    XR MIDLINE EQUAL OR GREATER THAN 5 YEARS  07/28/2018     XR MIDLINE EQUAL OR GREATER THAN 5 YEARS 07/28/2018       Current Medications:    Current Outpatient Medications   Medication Sig Dispense Refill    cyclobenzaprine (FLEXERIL) 5 MG tablet Take 1 tablet by mouth 3 times daily      oxyCODONE (ROXICODONE) 5 MG immediate release tablet Take 1 tablet by mouth every 4 hours as needed for Pain.      apixaban (ELIQUIS) 5 MG TABS tablet 0.5 tablets      acyclovir (ZOVIRAX) 400 MG tablet Take 1 tablet by mouth 2 times daily      lenalidomide (REVLIMID) 25 MG chemo capsule Take 1 capsule by mouth daily      furosemide (LASIX) 20 MG tablet Take 2 tablets by mouth daily Take 80 mg if wt goes up 3-5lbs (Patient taking differently: Take 1 tablet by mouth as needed Take 80 mg if wt goes up 3-5lbs) 30 tablet 1    gabapentin (NEURONTIN) 300 MG capsule TAKE ONE CAPSULE BY MOUTH 3 TIMES DAILY (Patient taking differently: Take 1 capsule by mouth 4 times daily.) 90 capsule 0    carvedilol (COREG) 25 MG tablet TAKE ONE TABLET BY MOUTH TWICE DAILY (Patient taking differently: Take 0.5 tablets by mouth 2 times daily (with meals)) 180 tablet 3    valsartan (DIOVAN) 320 MG tablet Take 1 tablet by mouth daily 90 tablet 3    atorvastatin (LIPITOR) 40 MG tablet Take 1 tablet by mouth daily 90 tablet 1    Cholecalciferol (VITAMIN D3) 25 MCG TABS TAKE TWO TABLETS BY MOUTH DAILY (Patient taking differently: Once a week at 5 PM TAKE TWO TABLETS BY MOUTH DAILY) 60 tablet  2    albuterol sulfate HFA (VENTOLIN HFA) 108 (90 Base) MCG/ACT inhaler Inhale 2 puffs into the lungs 4 times daily as needed for Wheezing 18 g 0    ondansetron (ZOFRAN-ODT) 4 MG disintegrating tablet DISSOLVE 1 TABLET ON THE TONGUE EVERY 8 HOURS AS NEEDED FOR NAUSEA 21 tablet 0    acetaminophen (TYLENOL) 500 MG tablet Take 1 tablet by mouth every 6 hours as needed for Pain      omeprazole (PRILOSEC) 20 MG delayed release capsule Take 1 capsule by mouth every morning (before breakfast) 90 capsule 1     No current  facility-administered medications for this visit.       Allergies:  Aspirin, Ibuprofen, Iodides, Iodinated contrast media, Mushroom, Povidone iodine, Shellfish allergy, Sulfa antibiotics, Sulfasalazine, Vancomycin, Amoxicillin-pot clavulanate, Ciprofloxacin, Coconut (cocos nucifera), Codeine, Egg-derived products, Fish-derived products, Iodine, Metronidazole, Soap & cleansers, and Sulfacetamide    Social History:   Social History     Socioeconomic History    Marital status: Single     Spouse name: Not on file    Number of children: Not on file    Years of education: Not on file    Highest education level: Not on file   Occupational History    Not on file   Tobacco Use    Smoking status: Never    Smokeless tobacco: Never   Vaping Use    Vaping status: Never Used   Substance and Sexual Activity    Alcohol use: Never    Drug use: Never    Sexual activity: Not on file   Other Topics Concern    Not on file   Social History Narrative    Not on file     Social Drivers of Health     Financial Resource Strain: Not on file   Food Insecurity: No Food Insecurity (09/02/2023)    Hunger Vital Sign     Worried About Running Out of Food in the Last Year: Never true     Ran Out of Food in the Last Year: Never true   Transportation Needs: No Transportation Needs (09/02/2023)    PRAPARE - Therapist, art (Medical): No     Lack of Transportation (Non-Medical): No   Physical Activity: Not on file   Stress: Not on file   Social Connections: Not on file   Intimate Partner Violence: Unknown (09/08/2022)    Received from [employer] and [employer], [employer]    Interpersonal Safety     Feel physically or emotionally unsafe where currently live: Not on file     Harm by anyone: Not on file     Emotionally Harmed: Not on file   Housing Stability: Low Risk  (09/02/2023)    Housing Stability Vital Sign     Unable to Pay for Housing in the Last Year: No     Number of Times Moved  in the Last Year: 1     Homeless in the Last Year: No       Family History:   Family History   Problem Relation Age of Onset    Cancer Paternal Grandmother         unk     Breast Cancer Maternal Aunt        Review of Systems:    Constitutional: No fever, no chills, no lethargy, no weakness.  HEENT:  No headache, otalgia, itchy eyes, nasal discharge or sore throat.  Cardiac:  No chest pain, dyspnea, orthopnea or PND.  Chest:  No cough, phlegm or wheezing.  Abdomen:  No abdominal pain, nausea or vomiting.  Neuro:  No focal weakness, abnormal movements orseizure like activity.  Skin:   No rashes, no itching.  GU:   No hematuria, no pyuria, no dysuria, no flank pain.  Extremities:  No swelling or joint pains.      Objective:  BP (!) 144/83   Pulse 66   Wt 110.9 kg (244 lb 9.6 oz)   BMI 43.33 kg/m     Physical Exam:  General appearance:Awake, alert, in no acute distress  Skin: warm and dry, no rash or erythema  Eyes: conjunctivae normal and sclera anicteric  ENT: :no thrush no pharyngeal congestion    Neck: without any JVD. No carotid bruits or thyromegaly. No  Lymphadenopathty:  Pulmonary: lungs are clear and without any wheezing or rhonchi   Cardiovascular:Normal S1 & S2, No S3 or  S4, No  Pericardial rub , No Murmur Abdomen: soft nontender, bowel sounds present, no organomegaly,  No ascites  Extremities: no cyanosis, clubbing or edema    Labs:    CBC:   Lab Results   Component Value Date/Time    WBC 7.3 09/04/2023 11:00 AM    RBC 4.90 09/04/2023 11:00 AM    HGB 14.1 09/04/2023 11:00 AM    HCT 43.4 09/04/2023 11:00 AM    MCV 88.6 09/04/2023 11:00 AM    RDW 13.5 09/04/2023 11:00 AM    PLT 77 09/04/2023 11:00 AM    MPV 10.5 09/04/2023 11:00 AM      BMP:   Lab Results   Component Value Date/Time    NA 138 09/05/2023 03:58 AM    NA 136 09/04/2023 03:49 AM    NA 139 09/02/2023 12:31 PM    K 4.0 09/05/2023 03:58 AM    K 4.0 09/04/2023 03:49 AM    K 4.0 09/02/2023 12:31 PM    K 4.7 08/22/2023 06:59 PM    K 4.1 07/12/2022  04:28 PM    CL 103 09/05/2023 03:58 AM    CL 102 09/04/2023 03:49 AM    CL 104 09/02/2023 12:31 PM    CO2 26 09/05/2023 03:58 AM    CO2 24 09/04/2023 03:49 AM    CO2 23 09/02/2023 12:31 PM    BUN 11 09/05/2023 03:58 AM    BUN 10 09/04/2023 03:49 AM    BUN 7 09/02/2023 12:31 PM    CREATININE 1.2 09/05/2023 03:58 AM    CREATININE 1.1 09/04/2023 03:49 AM    CREATININE 0.9 09/02/2023 12:31 PM    GLUCOSE 101 09/05/2023 03:58 AM    GLUCOSE 119 09/04/2023 03:49 AM    GLUCOSE 138 09/02/2023 12:31 PM    CALCIUM 8.7 09/05/2023 03:58 AM    CALCIUM 8.6 09/04/2023 03:49 AM    CALCIUM 9.6 09/02/2023 12:31 PM      BNP:No results found for: "BNP"  PHOSPHORUS:    Lab Results   Component Value Date/Time    PHOS 3.3 09/05/2023 03:58 AM    PHOS 3.0 09/04/2023 03:49 AM    PHOS 3.2 11/14/2021 11:59 AM     MAGNESIUM: No results found for: "MG"  ALBUMIN:   No results found for: "LABALBU"    IRON:  No results found for: "IRON"  IRON SATURATION:  No components found for: "LABIRON"  TIBC:  No results found for: "TIBC"  FERRITIN:  No results found for: "FERRITIN"  ANA:  No results found for: "ANA"    SPEP:   No results found for: "ALBCAL", "ALBPCT", "LABALPH", "A1PCT", "A2PCT", "LABBETA", "BETAPCT", "GAMGLOB", "GGPCT", "PATH"    UPEP: No results found for: "TPU"   HEPBSAG:No results found for: "HEPBSAG"  HEPCAB:No results found for: "HEPCAB"  C3: No results found for: "C3"  C4: No results found for: "C4"  MPO ANCA: No results found for: "MPO" .  PR3 ANCA:  No results found for: "PR3"  PTH: No results found for: "PTH"    Urine Creatinine:    No results found for: "LABCREA"    Urine Eosinophils: No results found for: "UREO"  Urine Protein:  No results found for: "TPU"  Urinalysis:  U/A:   Lab Results   Component Value Date/Time    NITRU Negative 09/02/2023 12:31 PM    COLORU Yellow 09/02/2023 12:31 PM    PHUR 7.5 09/02/2023 12:31 PM    PHUR 6.0 07/12/2022 05:05 PM    WBCUA 6-9 08/22/2023 09:09 PM    RBCUA 5-10 08/22/2023 09:09 PM    MUCUS 2+  08/22/2023 09:09 PM    BACTERIA 4+ 08/22/2023 09:09 PM    CLARITYU Clear 09/02/2023 12:31 PM    LEUKOCYTESUR Negative 09/02/2023 12:31 PM    UROBILINOGEN 0.2 09/02/2023 12:31 PM    BILIRUBINUR Negative 09/02/2023 12:31 PM    BLOODU Negative 09/02/2023 12:31 PM    GLUCOSEU Negative 09/02/2023 12:31 PM    KETUA Negative 09/02/2023 12:31 PM    AMORPHOUS 3+ 07/12/2022 05:05 PM     Wt Readings from Last 3 Encounters:   12/02/23 110.9 kg (244 lb 9.6 oz)   09/05/23 104.3 kg (230 lb)   08/22/23 109.8 kg (242 lb)       Radiology:  Reviewed as available.    Assessment        Plan:    CKD 3   CHF   HTN   Neuropathy   Vit D def   IBS  MM      Plan  - GFR stable with mild fluctuation   - Pt had bone marrow biopsy and was diagnosed with MM - on chemo per [facility]   - AKI sec to vol changes   - EF nl   - BNP 202 --> 195 --> 66--->16  - On BB  - Off jardiance as BP was low & poor appetite (per cards)  - Resume ARB at low dose for BP control     - off CCB as that cause edema  - dec Neurontin   - GFR - 20--->60--->54 -->53 -->56--->49--->41    Dry wt 235 lbs at home     - Avoid any NSAIDS     Chronic kidney disease education given.    Explained risk factors for progression of CKD.  No NSAIDs - e.g. Advil, motrin, ibuprofen, aleve, diclofenac, mobic, celebrex, nabumetone.etc.  Consume more plant-based proteins  avoid smoking  Avoid sodas  Avoid IV contrast  Exercise daily.  Take your blood pressure medications on time.    Thank you for the consultation.  Please do not hesitate to call with questions.

## 2023-12-04 ENCOUNTER — Encounter: Payer: MEDICARE | Attending: Nephrology | Primary: Family Medicine

## 2023-12-10 ENCOUNTER — Encounter: Payer: Medicare (Managed Care) | Attending: Speech-Language Pathologist | Primary: Family Medicine

## 2023-12-19 ENCOUNTER — Encounter: Payer: Medicare (Managed Care) | Attending: Cardiovascular Disease | Primary: Family Medicine

## 2023-12-23 ENCOUNTER — Encounter: Payer: Medicare (Managed Care) | Attending: Nurse Practitioner | Primary: Family Medicine

## 2024-03-05 ENCOUNTER — Encounter: Payer: Medicare (Managed Care) | Attending: Nurse Practitioner | Primary: Family Medicine

## 2024-03-11 ENCOUNTER — Ambulatory Visit
Admit: 2024-03-11 | Discharge: 2024-03-11 | Payer: Medicare (Managed Care) | Attending: Nurse Practitioner | Primary: Family Medicine

## 2024-03-11 MED ORDER — CARVEDILOL 25 MG PO TABS
25 | ORAL_TABLET | Freq: Two times a day (BID) | ORAL | 3 refills | 30.00000 days | Status: AC
Start: 2024-03-11 — End: ?

## 2024-03-11 MED ORDER — FUROSEMIDE 20 MG PO TABS
20 | ORAL_TABLET | ORAL | 3 refills | 30.00000 days | Status: AC
Start: 2024-03-11 — End: ?

## 2024-03-11 NOTE — Progress Notes (Signed)
 [facility]  [phone]    Primary Cardiologist: Dr Feliciano     CC CHF    HPI:  73 y.o. here for HFpEF, resistant HTN, and HLD. She c/o weight gain, BLE edema R>L, SOB, orthopnea, abdominal bloating and early satiety worsening over the last 3-4 months. She limits salt and fluid intake. Friday she had lightheadedness and nausea. She had diarrhea intermittently.    She denies cp, palpitations, syncope or falls. No fever or GI/GU bleeding. Hx of polypectomy (cancerous polyp). She also follows with [facility] for multiple myeloma which is in remission and she gets monthly maintenance chemo.      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    s/i I&D and removaal of hardware    GERD (gastroesophageal reflux disease)     HTN (hypertension)     Hyperlipidemia     IBS (irritable bowel syndrome)     Incontinence of urine     Lumbar radiculopathy     Morbid obesity with BMI of 45.0-49.9, adult (HCC)     Multiple allergies     Multiple myeloma (HCC)     Muscle fasciculation     Neuropathic pain     back 2/2 to spinal surgeries    Prolonged emergence from general anesthesia     SBO (small bowel obstruction) (HCC) 07/1999    with partial colectomy    Spinal fracture of T8 vertebra (HCC)     s/p fusion     Surgical site infection     back, spinal hardware     Urinary incontinence     Uses walker      Past Surgical History:   Procedure Laterality Date    ABDOMEN SURGERY N/A 05/16/2021    ABDOMINAL FAT PAD EXCISIONAL BIOPSY performed by Sheril GORMAN JONELLE Lethia, MD at [facility] OR    ABDOMINAL HERNIA REPAIR  2010    APPENDECTOMY      BREAST LUMPECTOMY Bilateral     benign lesions    CARDIAC CATHETERIZATION  2012    CHOLECYSTECTOMY      COLONOSCOPY      COLONOSCOPY   05/26/2020    COLONOSCOPY POLYPECTOMY SNARE/COLD BIOPSY performed by Alm JINNY Minder, MD at [facility] ENDOSCOPY    COLONOSCOPY  05/26/2020    COLONOSCOPY WITH BIOPSY performed by Alm JINNY Minder, MD at [facility] ENDOSCOPY    COLONOSCOPY N/A 11/08/2022    COLONOSCOPY WITH BIOPSY, COLD SNARE/HOT SNARE performed by Minder Alm JINNY, MD at [facility] ENDOSCOPY    COLONOSCOPY N/A 12/16/2022    COLONOSCOPY BIOPSY performed by Minder Alm JINNY, MD at [facility] ENDOSCOPY    CT BIOPSY BONE MARROW  01/03/2021    CT BONE MARROW BIOPSY 01/03/2021 [facility] CT SCAN    HYSTERECTOMY, TOTAL ABDOMINAL (CERVIX REMOVED)      OVARY REMOVAL      PORT SURGERY Left 12/11/2022    LEFT SUBCLAVIAN PORT PLACEMENT performed by Ruffus Darleene BIRCH, MD at [facility] OR    TOTAL KNEE ARTHROPLASTY Bilateral 2009    TOTAL SHOULDER ARTHROPLASTY W/ DISTAL CLAVICLE EXCISION Right 2010    UPPER GASTROINTESTINAL ENDOSCOPY N/A 05/26/2020  EGD BIOPSY performed by Alm JINNY Minder, MD at [facility] ENDOSCOPY    XR MIDLINE EQUAL OR GREATER THAN 5 YEARS  07/28/2018    XR MIDLINE EQUAL OR GREATER THAN 5 YEARS 07/28/2018     Family History   Problem Relation Age of Onset    Cancer Paternal Grandmother         unk     Breast Cancer Maternal Aunt      Social History     Tobacco Use    Smoking status: Never    Smokeless tobacco: Never   Vaping Use    Vaping status: Never Used   Substance Use Topics    Alcohol use: Never    Drug use: Never       Allergies:Aspirin , Ibuprofen, Iodides, Iodinated contrast media, Mushroom, Povidone iodine, Shellfish allergy , Sulfa antibiotics, Sulfasalazine, Vancomycin, Amoxicillin-pot clavulanate, Ciprofloxacin , Coconut (cocos nucifera), Codeine, Egg-derived products, Fish-derived products, Iodine, Metronidazole , Soap & cleansers, and Sulfacetamide    Review of Systems  A 12 point review of systems completed. Pertinent positives identified in the HPI    Physical Exam:  BP (!) 152/88   Pulse 80   Wt 114.8 kg (253 lb)   SpO2 100%   BMI 44.82 kg/m    General (appearance):  No acute  distress  Eyes: anicteric   Neck: soft, No JVD  Ears/Nose/Mouth/Thorat: No cyanosis  CV: RRR   Respiratory:  clear  GI: soft, non-tender, non-distended  Skin: Warm, dry. No rashes  Neuro/Psych: Alert and oriented x 3. Appropriate behavior  Ext:  No c/c. + BLE  edema    Weight  Wt Readings from Last 3 Encounters:   03/11/24 114.8 kg (253 lb)   12/02/23 110.9 kg (244 lb 9.6 oz)   09/05/23 104.3 kg (230 lb)        CBC:   Lab Results   Component Value Date    WBC 7.3 09/04/2023    HGB 14.1 09/04/2023    HCT 43.4 09/04/2023    MCV 88.6 09/04/2023    PLT 77 (L) 09/04/2023     BMP:  Lab Results   Component Value Date    CREATININE 1.2 09/05/2023    BUN 11 09/05/2023    NA 138 09/05/2023    K 4.0 09/05/2023    CL 103 09/05/2023    CO2 26 09/05/2023     CrCl cannot be calculated (Patient's most recent lab result is older than the maximum 180 days allowed.).  Mag: No results found for: MG  LIVER PROFILE:   Lab Results   Component Value Date    ALT 9 (L) 08/22/2023    AST 30 08/22/2023    ALKPHOS 77 08/22/2023    BILITOT 0.5 08/22/2023     Pro-BNP   Lab Results   Component Value Date/Time    PROBNP 420 09/02/2023 12:31 PM    PROBNP 149 08/22/2023 06:59 PM    PROBNP 458 11/14/2021 11:59 AM    PROBNP 183 04/23/2021 10:46 PM    PROBNP 202 10/11/2019 02:32 PM     LIPIDS:  Lab Results   Component Value Date    CHOL 224 (H) 08/04/2019    TRIG 124 08/04/2019    HDL 62 (H) 08/04/2019    LDL 137 (H) 08/04/2019    VLDL 25 08/04/2019       IMAGING:     09/02/2023 ECG:     Sinus tachycardiaLow voltage QRSCannot rule out Anterior infarct , age undetermined  2022 Nuc stress   2-Day nuclear stress test.   Stress part was completed today.   There is normal isotope uptake at stress. There is no evidence of myocardial   ischemia or scar.   Normal LV size and systolic function.   Left ventricular ejection fraction of 68 %.   There are no regional wall motion abnormalities.   Overall findings represent a low risk scan.    2021 Echo    Limited  study. Left ventricular cavity size is normal. Normal left   ventricular wall thickness.   Overall left ventricular systolic function appears normal with an ejection   fraction of 60-65%.   No regional wall motion abnormalities   Normal diastolic function.   Estimated pulmonary artery systolic pressure is at 26 mmHg assuming a right   atrial pressure of 3 mmHg.    Medications:   Current Outpatient Medications   Medication Sig Dispense Refill    traMADol  (ULTRAM ) 50 MG tablet Take 1 tablet by mouth every 6 hours as needed for Pain.      valsartan  (DIOVAN ) 40 MG tablet Take 1 tablet by mouth daily      triamcinolone (KENALOG) 0.1 % cream Apply topically      loperamide (IMODIUM) 2 MG capsule Take 1 capsule by mouth as needed for Diarrhea      ipratropium 0.5 mg-albuterol  2.5 mg (DUONEB ) 0.5-2.5 (3) MG/3ML SOLN nebulizer solution       cyclobenzaprine (FLEXERIL) 5 MG tablet Take 1 tablet by mouth 3 times daily as needed for Muscle spasms      oxyCODONE  (ROXICODONE ) 5 MG immediate release tablet Take 1 tablet by mouth every 4 hours as needed for Pain.      apixaban  (ELIQUIS ) 5 MG TABS tablet Take 1 tablet by mouth 2 times daily      acyclovir (ZOVIRAX) 400 MG tablet Take 1 tablet by mouth 2 times daily      furosemide  (LASIX ) 20 MG tablet Take 2 tablets by mouth daily Take 80 mg if wt goes up 3-5lbs (Patient taking differently: Take 2 tablets by mouth daily Take 40 mg if wt goes up 3-5lbs) 30 tablet 1    gabapentin  (NEURONTIN ) 300 MG capsule TAKE ONE CAPSULE BY MOUTH 3 TIMES DAILY (Patient taking differently: Take 1 capsule by mouth 4 times daily. 1 pill in the AM 1 pill at noon 2 pills at night) 90 capsule 0    carvedilol  (COREG ) 25 MG tablet TAKE ONE TABLET BY MOUTH TWICE DAILY (Patient taking differently: Take 0.5 tablets by mouth 2 times daily (with meals)) 180 tablet 3    atorvastatin  (LIPITOR) 40 MG tablet Take 1 tablet by mouth daily 90 tablet 1    Cholecalciferol  (VITAMIN D3) 25 MCG TABS TAKE TWO TABLETS BY  MOUTH DAILY (Patient taking differently: Take 1 tablet by mouth Once a week at 5 PM) 60 tablet 2    albuterol  sulfate HFA (VENTOLIN  HFA) 108 (90 Base) MCG/ACT inhaler Inhale 2 puffs into the lungs 4 times daily as needed for Wheezing 18 g 0    ondansetron  (ZOFRAN -ODT) 4 MG disintegrating tablet DISSOLVE 1 TABLET ON THE TONGUE EVERY 8 HOURS AS NEEDED FOR NAUSEA 21 tablet 0    omeprazole  (PRILOSEC) 20 MG delayed release capsule Take 1 capsule by mouth every morning (before breakfast) 90 capsule 1    acetaminophen  (TYLENOL ) 500 MG tablet Take 1 tablet by mouth every 6 hours as needed for Pain      lenalidomide (REVLIMID)  25 MG chemo capsule Take 1 capsule by mouth daily       No current facility-administered medications for this visit.       Assessment:  1. Mixed hyperlipidemia    2. Chronic diastolic heart failure (HCC)    3. Acute on chronic diastolic congestive heart failure (HCC)    4. Shortness of breath    5. Resistant hypertension        Plan:    Acute on chronic HFpEF   Discussed s/s decompensated HF  Daily weights, low salt diet, fluid restrictions, compression stockings    Increase lasix  to 60 mg daily; additional 20 mg PRN weight gain   Increase coreg  to 25 mg bid   Valsartan  40 mg daily    Echo   BMP    Consider jardiance  and MRA next visit. Limited use d/t CKD    Resistant HTN   BP 152/88   Increase coreg  to 25 mg bid     HLD   Check CMP/Lipids   Lipitor 40 mg dialy     Follow up after echo

## 2024-04-02 ENCOUNTER — Inpatient Hospital Stay: Admit: 2024-04-02 | Payer: Medicare (Managed Care) | Primary: Family Medicine

## 2024-04-02 VITALS — BP 152/88

## 2024-04-02 DIAGNOSIS — I5033 Acute on chronic diastolic (congestive) heart failure: Principal | ICD-10-CM

## 2024-04-02 LAB — ECHO (TTE) COMPLETE (PRN CONTRAST/BUBBLE/STRAIN/3D)
AV Area by Peak Velocity: 2.6 cm2
AV Area by VTI: 2.7 cm2
AV Mean Gradient: 2 mmHg
AV Mean Velocity: 0.6 m/s
AV Peak Gradient: 3 mmHg
AV Peak Velocity: 0.9 m/s
AV VTI: 19.6 cm
AV Velocity Ratio: 1
Aortic Arch: 3 cm
Aortic Root: 2.9 cm
E/E' Lateral: 7.46
E/E' Ratio (Averaged): 8.81
E/E' Septal: 10.17
EF Physician: 57 %
LA Area 2C: 14.6 cm2
LA Area 4C: 14.3 cm2
LA Major Axis: 4.9 cm
LA Minor Axis: 4.7 cm
LA Volume BP: 35 mL (ref 22–52)
LA Volume MOD A2C: 35 mL (ref 22–52)
LA Volume MOD A4C: 34 mL (ref 22–52)
LV E' Lateral Velocity: 9.79 cm/s
LV E' Septal Velocity: 7.18 cm/s
LVOT Area: 2.8 cm2
LVOT Diameter: 1.9 cm
LVOT Mean Gradient: 2 mmHg
LVOT Peak Gradient: 3 mmHg
LVOT Peak Velocity: 0.9 m/s
LVOT SV: 52.7 mL
LVOT VTI: 18.6 cm
LVOT:AV VTI Index: 0.95
MV A Velocity: 0.79 m/s
MV E Velocity: 0.73 m/s
MV E Wave Deceleration Time: 211 ms
MV E/A: 0.92
PV Max Velocity: 0.8 m/s
PV Mean Gradient: 1 mmHg
PV Mean Velocity: 0.5 m/s
PV Peak Gradient: 2 mmHg
PV VTI: 15.6 cm
RA Area 4C: 12.1 cm2
RA Volume: 24 mL
RV Basal Dimension: 3.7 cm
RV Free Wall Peak S': 12.8 cm/s
RV Longitudinal Dimension: 7.1 cm
RV Mid Dimension: 3.5 cm
TAPSE: 1.9 cm (ref 1.7–?)

## 2024-04-02 MED ORDER — SULFUR HEXAFLUORIDE MICROSPH 60.7-25 MG IJ SUSR
60.7-25 | Freq: Once | INTRAMUSCULAR | Status: AC | PRN
Start: 2024-04-02 — End: 2024-04-02
  Administered 2024-04-02: 16:00:00 2 mL via INTRAVENOUS

## 2024-04-02 NOTE — Progress Notes (Signed)
 Vascular Access Team called to assist with PIV.  See Flowsheet for USG PIV information, below is ultrasound evaluation of vein:

## 2024-04-05 ENCOUNTER — Encounter: Payer: Medicare (Managed Care) | Attending: Nephrology | Primary: Family Medicine

## 2024-04-09 ENCOUNTER — Encounter: Payer: Medicare (Managed Care) | Attending: Nurse Practitioner | Primary: Family Medicine

## 2024-05-17 ENCOUNTER — Encounter: Payer: Medicare (Managed Care) | Attending: Nephrology | Primary: Family Medicine

## 2024-05-27 ENCOUNTER — Encounter: Payer: Medicare (Managed Care) | Attending: Nephrology | Primary: Family Medicine

## 2024-06-02 ENCOUNTER — Inpatient Hospital Stay: Admit: 2024-06-02 | Discharge: 2024-06-02 | Payer: Medicare (Managed Care) | Primary: Family Medicine

## 2024-06-02 DIAGNOSIS — M542 Cervicalgia: Principal | ICD-10-CM

## 2024-06-02 NOTE — Plan of Care (Signed)
 "     [facility] - Outpatient Rehabilitation and Therapy: [address], [place], [place] [address] office: [phone] fax: [phone]     Physical Therapy Initial Evaluation Certification      Dear Laverda Ovens, MD,    We had the pleasure of evaluating the following patient for physical therapy services at [facility].  A summary of our findings can be found in the initial assessment below.  This includes our plan of care.  If you have any questions or concerns regarding these findings, please do not hesitate to contact me at the office phone number listed above.  Thank you for the referral.     Physician Signature:_______________________________Date:__________________  By signing above (or electronic signature), therapists plan is approved by physician       Physical Therapy: TREATMENT/PROGRESS NOTE   Patient: Gabrielle Wells (73 y.o. female)   Examination Date: 06/02/2024   DOB:  Jun 07, 1951 MRN: [id number]   Visit #: 1   Insurance Allowable Auth Needed   BMN [x] Yes    [] No    Insurance: Payor: [employer] MEDICARE / Plan: [employer] GOLD PLUS HMO / Product Type: *No Product type* /   Insurance ID: [id number] - (Medicare Managed)  Secondary Insurance (if applicable): [employer] [place]  *   Primary Treatment Diagnosis (choose one):  M54.2, G89.29    CHRONIC NECK PAIN    Secondary Treatment Diagnoses:  M54.59, G89.29    CHRONIC LOW BACK PAIN     Medical Diagnosis:  Multiple Myeloma [C90.00]   Referring Physician: Ovens Laverda, MD  PCP: Agapito Alfonso CROME, MD     Plan of care signed (Y/N):     Date of Patient follow up with Physician:      Plan of Care Report: EVAL today  POC update due: 06/30/24 (10 visits /OR AUTH LIMITS, whichever is less)                                              Medical History:  Comorbidities:  Multiple Myeloma, chemotherapy,Colon CA, arthritis, asthma, CHF,HTN,HLD, appendectomy, abdominal hernia repair, CKD, morbid obesity, spinal surgery x 5, L  RTC tear, R TSR, B TKR, falls, T8 Fx-fusion x 2 surgeries, incontinence, hosp in Jan for PNA  Relevant Medical History: chronic LBP and neck pain                                         Precautions/ Contra-indications:           Latex allergy :  NO  Pacemaker:    NO  Contraindications for Manipulation: NA  Date of Surgery: NA  Other:    Red Flags:  None    Suicide Screening:   The patient did not verbalize a primary behavioral concern, suicidal ideation, suicidal intent, or demonstrate suicidal behaviors.    Preferred Language for Healthcare:  English    SUBJECTIVE EXAMINATION     Patient stated complaint/comments: Pt reports having chronic LBP and neck pain with spinal surgeries x 5. Pt was diagnosed with Multiple Myeloma around the first of the year and takes chemo infusions monthly. Pt reports being in remission but continues with monthly infusions. Pt has been sleeping in recliner lift chair since PNA in January with goal to  resume sleeping in the bed.  Pt c/o increased fatigue, weakness and pain over the past few months with goal to decrease pain and walk better.       Test used Initial score  06/02/24 06/02/2024   Pain Summary VAS Neck 7/10  LBP 5/10    Functional questionnaire NDI  MODI 34/50 (68%)  25/50 (50%)    Other:              Pain:  Pain location: neck, thoracic and lumbar regions  Patient describes pain to be constant ache  Pain decreases with: Medication has Tylenol , Tramadol  and Percocet   Pain increases with: standing, walking,overhead reaching,sleeping,stairs,and transitional movements     Occupation/School:  Work/School Status: Retired  Environmental Education Officer Duties/Demands: NA  Active Driver: No; does not plan to return to driving  Leisure/Hobbies/Sports: none noted    Current Functional Limitations:    Functional Complaints:  inability to get in/out of bed, difficulty walking in/out of home, sleeping, stair negotiation,  ADLs     PLOF:  was able to sleep and get in/out of bed I, I ADLs , less pain with all daily  activity   Pt's sleep is affected?   Yes     Hand Dominance:   left    Social Support/Environment:  Lives with: alone   Family/caregiver support needed prior to current illness: HHA 5 hrs/day 5 days/wk assisting with shower,meals,laundry, home making and  daughter does shopping   Home Accessibility/Assistive Devices:1 level apt, pt has FWW,shower chair,lift chair,and 4ww    Review Of Systems (ROS):  [x]  Performed Review of systems (Integumentary, CardioPulmonary, Neurological) by intake and observation. Intake form is in the medical record. Patient has been instructed to contact their primary care physician regarding ROS issues if not already being addressed at this time.    [x]  Patient history, allergies, meds reviewed. Medical chart reviewed. See intake form.     OBJECTIVE EXAMINATION     06/02/24    ROM/Strength: (Blank cells denote NT)      Mvmt (norm) ROM L ROM R Notes MMT L MMT R Notes     CERVICAL Flex (60) WNL       Ext (70)        SB(45) 15 20        Rotation (80) 22 45           SHOULDER Flexion (180) 90(P)   60 (A) 80(P) 50(A)  3- 3-     Abduction (180) 90(P) 70(P)  3- 3-     ER -0          ER -90 (90) (P) 10 (P) 45  3- 3-     IR -0          IR -90 (70) (P) 70 (P) 40  3- 3-      ELBOW Flex/biceps (140) wfl wfl  4 4     Ext/triceps (0) wfl wfl  4 4     Pronation (80)          Supination (80)             WRIST Flexion (60)          Extension (60)          RD (20)          UD (20)          Grip    good good       Mvmt (norm) ROM L ROM R Notes MMT L  MMT R Notes     LUMBAR   Flex (90)        Ext (25)        SB (25)          Lumbar Rot             HIP Flex (120) 100 100  3- 3-     Abd (45) wfl wfl  3- 3-     ER (50) 45 45        IR (45) 20 20        Ext (20) 0 0  1 1    KNEE   Flex (140) 100 95        Ext (0) 0 0           ANKLE DF (20)          PF (50)          Inversion (30)          Eversion (20)           Special Tests:   N/A    Specific Joint Mobility Testing/Accessory Motions:      Cervical: hypomobility  of  C2-7    Flexibility:  Hip Flexors/ThomasTest: Decreased R and Decreased L  Hamstrings (90/90): Decreased R and Decreased L  Piriformis: Decreased R and Decreased L  Gastroc: Decreased R and Decreased L and Pec Minor: Decreased R and Decreased L  Upper Trap: Decreased R and Decreased L  Levator Scap: Decreased R and Decreased L  Scalenes: Decreased R and Decreased L  Suboccipitals: Decreased R and Decreased L    Observations:  Dermatomes: light touch intact  Reflexes: Not Tested  Palpation: Patient reported tenderness with palpation  Posture: forward head, forward trunk, rounded shoulders, and increased thoracic kyphosis  Bandages/Dressings/Incisions: Not Applicable  Edema: not observed    Gait:   Dysfunction Noted  Gait Deviations: decreased cadence  decreased step length  forward flexed posture  Assistive Device Used: Rolling walker (RW) (too high)  Level of Assistance: Supervision    Balance:     NT due to time, balance is fair with AD    Functional Tests:  30 Second Sit to Stand: UE assist x 4  reps  TUG: 30sec w/FWW, UE assist    Falls Risk Assessment (30 days):   History of Falls? Yes  Falls Risk assessed and patient requires intervention due to being higher risk   Time Up and Go (TUG):   Not Assessed  23 seconds or slower (100% functional limitation)       Exercises/Interventions     Observation: see above    Therapeutic Ex (02889)  Resistance Sets/time Reps Notes/Cues/Progressions                                      NMR re-education (02887)                            Therapeutic Activity (97530) 13 min      Education in body mechanics and back protection with supine<> sitting, walker lowered 3 notches                     Manual Intervention (97140) Time: 14 min   Manual cervical traction, gentle stretching upper traps,levator and pecs, gentle joint mobilization for side bending and rotation  Gait Training (02883)           Modalities:    No modalities applied this session    Education/Home  Exercise Program:        ASSESSMENT     Assessment:   Zanyiah A Lowder is a 73 y.o. female referred to OPPT for chronic back pain and multiple myeloma.  Pt presents with deficits in postural alignment and control, unsteady gait, decreased functional mobility, strength, ROM/flexibility and constant pain. Pt is limited in functional transfers and ambulation with ADLs  as well as sleep due to exacerbation of pain and weakness following treatment of Multiple myeloma 9 months ago.   Pt. presents with the functional impairments and activity limitations listed below and would benefit from Outpatient PT to address the below impairments as well as improve pain, and restore function.     Functional Impairments:   Noted cervical/thoracic/Glenohumeral joint hypomobility  Decreased cervical/UE functional ROM  Decreased Rotator Cuff/Scapular/Core strength and neuromuscular control  Decreased UE functional strength  Decreased UE functional ROM  Decreased core/proximal hip strength and neuromuscular control   Decreased LE functional strength   Reduced balance/proprioceptive control   Decreased LE endurance  Gait instability/impairment    Functional Activity Limitations (from functional questionnaire and intake):  Any of your usual work, housework, or school activities  Grooming your Writer food (eg, peeling, cutting)  Dressing  Opening doors  Sleeping  Laundering clothes (eg, washing, ironing, folding)  Reaching up into a cabinet  Reaching behind back   Reduced ability to tolerate prolonged functional positions  Reduced ability or difficulty with changes of positions or transfers between positions  Reduced ability to maintain good posture and demonstrate good body mechanics with sitting, bending, and lifting  Reduced ability to sleep  Reduced ability to perform lifting, reaching, carrying tasks  Reduced ability to forward bend  Reduced ability to ambulate prolonged functional periods/distances/surfaces  Reduced ability to  ascend/descend stairs  difficulty with self care    Participation Restrictions:   Reduced participation in self care  Reduced participation in home management   Reduced participation in social activities  Reduced participation in sports/recreation    Classification :   Signs/symptoms consistent with neck pain with mobility deficits    Signs/symptoms consistent with postural dysfunction  Signs/symptoms consistent with shoulder pathology  Signs/symptoms consistent with rotator cuff tear     Signs/symptoms consistent with Lumbar instability//motor control subgroup.     Signs/symptoms consistent with functional hip weakness/NMR control   Signs/symptoms consistent with physical deconditioning  Signs/symptoms consistent with gait instability/impairment       Tolerance of evaluation/treatment: Fair    Barriers to/and or personal factors that will affect rehab potential:   None noted  Co-morbidities    Physical Therapy Evaluation Complexity Justification  [x]  A history of present problem and 3 or more personal factors and/or co-morbidities that impact the plan of care [High]  [x]  A total of 3 elements [Mod] found upon examination of body systems using standardized tests and measures addressing any of the following: body structures, functions (impairments), activity limitations, and/or participation restrictions  [x]  A clinical presentation with stable and/or uncomplicated characteristics [Low]  [x]  Clinical decision making of MODERATE (02837 - Typically 30 minutes face-to-face) complexity using standardized patient assessment instrument and/or measurable assessment of functional outcome.    Today's Assessment: See above    Medical Necessity Documentation:  I certify that this patient meets the below criteria necessary for medical necessity for care and/or justification  of therapy services:  The patient has functional impairments and/or activity limitations and would benefit from continued outpatient therapy services to  address the deficits outlined in the patients goals  The patient has a musculoskeletal condition(s) with a corresponding ICD-10 code that is of complexity and severity that require skilled therapeutic intervention. This has a direct and significant impact on the need for therapy and significantly impacts the rate of recovery.   The patient has associated co-morbidities along with primary diagnosis which significantly impact the rate of recovery and contribute to complexities that require skilled therapeutic intervention    Prognosis for POC: [x]  Good []  Fair  []  Poor    Patient requires continued skilled intervention: [x]  Yes  []  No    CHARGE CAPTURE     PT CHARGE GRID   CPT Code (TIMED) minutes # CPT Code (UNTIMED) #     Therex (97110)     EVAL:MODERATE (02837 - Typically 30 minutes face-to-face) 1    Neuromusc. Re-ed (02887)    Re-Eval (02835)     Manual (97140) 14 1  Estim Unattended (97014)     Ther. Act (02469) 13 1  Mech. Traction [phone])     Gait [phone])    Dry Needle 1-2 muscle (79439)     Aquatic Therex [phone])    Dry Needle 3+ muscle (79438)     Iontophoresis (02966)    VASO (02983)     Ultrasound (02964)    Group Therapy (02849)     Estim Attended (02967)    Canalith Repositioning (04007)     Physical Performance Test [phone])    Custom orthotic (O6979)     Other:    Other:    Total Timed Code Tx Minutes 27 2  1      Total Treatment Minutes 47        Charge Justification:  (02469) THERAPEUTIC ACTIVITY - use of dynamic activities to improve functional performance. (Ex include squatting, ascending/descending stairs, walking, bending, lifting, catching, throwing, pushing, pulling, jumping.)  Direct, one on one contact, billed in 15-minute increments.  [phone]) MANUAL THERAPY -  Manual therapy techniques, 1 or more regions, each 15 minutes (Mobilization/manipulation, manual lymphatic drainage, manual traction) for the purpose of modulating pain, promoting relaxation,  increasing ROM, reducing/eliminating soft  tissue swelling/inflammation/restriction, improving soft tissue extensibility and allowing for proper ROM for normal function with self care, mobility, lifting and ambulation      GOALS     Patient stated goal: decrease pain and walk better  []  Progressing: []  Met: []  Not Met: []  Adjusted    Therapist goals for Patient:   Short Term Goals: To be achieved in: 4 weeks  1. Pt will demonstrate I supine<> sitting with proper body mechanics and back protection  []  Progressing: []  Met: []  Not Met: []  Adjusted  2. Patient will have a decrease in pain to <4/10 in the neck to facilitate improvement in movement, sleeping, and ADLs   []  Progressing: []  Met: []  Not Met: []  Adjusted    Long Term Goals: To be achieved in: 12 weeks  1. Disability index score of 30% or less for the Neck Disability Index to assist with reaching prior level of function with activities such as sleeping, ADLs and recreational activity.  []  Progressing: []  Met: []  Not Met: []  Adjusted  2. Patient will demonstrate increased AROM of B knees to 110 degrees without pain to allow for proper joint functioning to enable patient to stand from normal height surfaces in/out  of  the home .   []  Progressing: []  Met: []  Not Met: []  Adjusted  3. Patient will demonstrate increased Strength of BLEs to at least 3/5 throughout without pain to allow for proper functional mobility to enable patient to return to I ADLs and functional mobility as PLOF.   []  Progressing: []  Met: []  Not Met: []  Adjusted  4. Patient will return to sleeping in the bed without increased symptoms or restriction.   []  Progressing: []  Met: []  Not Met: []  Adjusted  5. Pt will demonstrate I HEP/progression to prevent recurrence of presenting problems    []  Progressing: []  Met: []  Not Met: []  Adjusted     Overall Progression Towards Functional goals/ Treatment Progress Update:  []  Patient is progressing as expected towards functional goals listed.    []  Progression is slowed due to  complexities/Impairments listed.  []  Progression has been slowed due to co-morbidities.  [x]  Plan just implemented, too soon (<30days) to assess goals progression   []  Goals require adjustment due to lack of progress  []  Patient is not progressing as expected and requires additional follow up with physician  []  Other:     TREATMENT PLAN     Frequency/Duration: 1-2x/week for 12 weeks for the following treatment interventions:    Interventions:  Therapeutic Exercise (97110) including: strength training, ROM, and functional mobility  Therapeutic Activities (97530) including: functional mobility training and education.  Neuromuscular Re-education [phone]) activation and proprioception, including postural re-education.    Gait Training (02883) for normalization of ambulation patterns and AD training.   Manual Therapy [phone]) as indicated to include: Passive Range of Motion, Gr I-IV mobilizations, Soft Tissue Mobilization, Trigger Point Release, and Myofascial Release  Modalities as needed that may include: Electrical Stimulation and Ultrasound  Patient education on joint protection, postural re-education, activity modification, and progression of HEP    Plan: POC initiated as per evaluation    Electronically Signed by Duwaine Bunde, PT  Date: 06/02/2024     Note: Portions of this note have been templated and/or copied from initial evaluation, reassessments and prior notes for documentation efficiency.    Note: If patient does not return for scheduled/recommended follow up visits, this note will serve as a discharge from care along with the most recent update on progress.    Complex Ortho Evaluation   "

## 2024-06-07 ENCOUNTER — Encounter: Payer: Medicare (Managed Care) | Attending: Nephrology | Primary: Family Medicine

## 2024-06-14 ENCOUNTER — Inpatient Hospital Stay: Admit: 2024-06-14 | Discharge: 2024-06-14 | Payer: Medicare (Managed Care) | Primary: Family Medicine

## 2024-06-14 NOTE — Flowsheet Note (Signed)
 "     [facility] - Out[facility] and Therapy: [address], [place], [place] [address] office: [phone] fax: [phone]       Physical Therapy: TREATMENT/PROGRESS NOTE   Patient: Gabrielle Wells (73 y.o. female)   Examination Date: 06/14/2024   DOB:  02-02-1951 MRN: [id number]   Visit #: 2   Insurance Allowable Auth Needed   BMN [x] Yes    [] No    Insurance: Payor: [employer] / Plan: [employer] G[employer]US HMO / Product Type: *No Product type* /   Insurance ID: [id number] - (Medicare Managed)  Secondary Insurance (if applicable): [employer] [place]  *   Primary Treatment Diagnosis (choose one):  M54.2, G89.29    CHRONIC NECK PAIN    Secondary Treatment Diagnoses:  M54.59, G89.29    CHRONIC LOW BACK PAIN     Medical Diagnosis:  Multiple Myeloma [C90.00]   Referring Physician: Marinus Mask, MD  PCP: Agapito Alfonso CROME, MD     Plan of care signed (Y/N):     Date of Patient follow up with Physician:      Plan of Care Report: NO  POC update due: 06/30/24 (10 visits /OR AUTH LIMITS, whichever is less)                                              Medical History:  Comorbidities:  Multiple Myeloma, chemotherapy,Colon CA, arthritis, asthma, CHF,HTN,HLD, appendectomy, abdominal hernia repair, CKD, morbid obesity, spinal surgery x 5, L RTC tear, R TSR, B TKR, falls, T8 Fx-fusion x 2 surgeries, incontinence, hosp in Jan for PNA  Relevant Medical History: chronic LBP and neck pain                                         Precautions/ Contra-indications:           Latex allergy :  NO  Pacemaker:    NO  Contraindications for Manipulation: NA  Date of Surgery: NA  Other:    Red Flags:  None    Suicide Screening:   The patient did not verbalize a primary behavioral concern, suicidal ideation, suicidal intent, or demonstrate suicidal behaviors.    Preferred Language for Healthcare:  English    SUBJECTIVE EXAMINATION     Patient stated complaint/comments: Pt reports having chronic LBP and neck pain with  spinal surgeries x 5. Pt was diagnosed with Multiple Myeloma around the first of the year and takes chemo infusions monthly. Pt reports being in remission but continues with monthly infusions. Pt has been sleeping in recliner lift chair since PNA in January with goal to resume sleeping in the bed.  Pt c/o increased fatigue, weakness and pain over the past few months with goal to decrease pain and walk better.       Test used Initial score  06/02/24 06/14/2024   Pain Summary VAS Neck 7/10  LBP 5/10 L Neck 9/10   Functional questionnaire NDI  MODI 34/50 (68%)  25/50 (50%)    Other:              OBJECTIVE EXAMINATION     06/02/24  Observation: Severe rounded shoulder girdle, FHP, Dowagers hump/increased kyphosis  Cervical ROM, flexion WNL, ext: 0 LSB 15, RSB 20, LR 22, RR  45  Using FWW with decreased functional speed  Functional Tests:  30 Second Sit to Stand: UE assist x 4  reps  TUG: 30sec w/FWW, UE assist        Exercises/Interventions       Therapeutic Ex (02889)  Resistance Sets/time Reps Notes/Cues/Progressions          Shoulder blade squeezes   5-10    pulleys   6-8                  NMR re-education (02887)                            Therapeutic Activity (97530)                            Manual Intervention (97140) Time:  20 min   Manual cervical traction, occipital base release, PROM all direction and gentle stretching upper traps,levator and pecs, PROM/stretching B shoulders, GH distraction             Gait Training (02883)           Modalities:    No modalities applied this session    Education/Home Exercise Program:        ASSESSMENT       Today's Assessment: Gabrielle A Wells is a 73 y.o. female referred to OPPT for chronic back and neck pain with generalized weakness due to multiple myeloma.  Pt presents with FWW with decreased functional speed and significant FHP, rounded shoulder girdle, Dowager's hump, and increased thoracic kyphosis. Pt demonstrates poor UE ROM and strength, is dependent with supine<>  sitting transfers on/off treatment table, and is unable to lie flat due to significant postural deficits and pain. Pt tolerated treatment well and reported decreased pain to 5-7/10 following treatment. Pt will benefit from skilled PT in order to decrease pain and improve functional mobility and independence.     Medical Necessity Documentation:  I certify that this patient meets the below criteria necessary for medical necessity for care and/or justification of therapy services:  The patient has functional impairments and/or activity limitations and would benefit from continued outpatient therapy services to address the deficits outlined in the patients goals  The patient has a musculoskeletal condition(s) with a corresponding ICD-10 code that is of complexity and severity that require skilled therapeutic intervention. This has a direct and significant impact on the need for therapy and significantly impacts the rate of recovery.   The patient has associated co-morbidities along with primary diagnosis which significantly impact the rate of recovery and contribute to complexities that require skilled therapeutic intervention    Prognosis for POC: [x]  Good []  Fair  []  Poor    Patient requires continued skilled intervention: [x]  Yes  []  No    CHARGE CAPTURE     PT CHARGE GRID   CPT Code (TIMED) minutes # CPT Code (UNTIMED) #     Therex (97110)  7 1  EVAL:MODERATE (97162 - Typically 30 minutes face-to-face)     Neuromusc. Re-ed (02887)    Re-Eval (02835)     Manual (97140) 20 1  Estim Unattended (97014)     Ther. Act (02469)    Mech. Traction ([id number])     Gait [phone])    Dry Needle 1-2 muscle (79439)     Aquatic Therex [phone])    Dry Needle 3+ muscle (79438)     Iontophoresis (  02966)    VASO (02983)     Ultrasound (02964)    Group Therapy (02849)     Estim Attended (02967)    Canalith Repositioning (04007)     Physical Performance Test [phone])    Custom orthotic [phone])     Other:    Other:    Total Timed Code Tx Minutes  27 2       Total Treatment Minutes 27        Charge Justification:  (02889) THERAPEUTIC EXERCISE - Provided verbal/tactile cueing for HEP and/or activities related to strengthening, flexibility, endurance, ROM performed to prevent loss of range of motion, maintain or improve muscular strength or increase flexibility, following either an injury or surgery.   [phone]) MANUAL THERAPY -  Manual therapy techniques, 1 or more regions, each 15 minutes (Mobilization/manipulation, manual lymphatic drainage, manual traction) for the purpose of modulating pain, promoting relaxation,  increasing ROM, reducing/eliminating soft tissue swelling/inflammation/restriction, improving soft tissue extensibility and allowing for proper ROM for normal function with self care, mobility, lifting and ambulation      GOALS     Patient stated goal: decrease pain and walk better  []  Progressing: []  Met: []  Not Met: []  Adjusted    Therapist goals for Patient:   Short Term Goals: To be achieved in: 4 weeks  1. Pt will demonstrate I supine<> sitting with proper body mechanics and back protection  []  Progressing: []  Met: []  Not Met: []  Adjusted  2. Patient will have a decrease in pain to <4/10 in the neck to facilitate improvement in movement, sleeping, and ADLs   []  Progressing: []  Met: []  Not Met: []  Adjusted    Long Term Goals: To be achieved in: 12 weeks  1. Disability index score of 30% or less for the Neck Disability Index to assist with reaching prior level of function with activities such as sleeping, ADLs and recreational activity.  []  Progressing: []  Met: []  Not Met: []  Adjusted  2. Patient will demonstrate increased AROM of B knees to 110 degrees without pain to allow for proper joint functioning to enable patient to stand from normal height surfaces in/out of  the home .   []  Progressing: []  Met: []  Not Met: []  Adjusted  3. Patient will demonstrate increased Strength of BLEs to at least 3/5 throughout without pain to allow for proper  functional mobility to enable patient to return to I ADLs and functional mobility as PLOF.   []  Progressing: []  Met: []  Not Met: []  Adjusted  4. Patient will return to sleeping in the bed without increased symptoms or restriction.   []  Progressing: []  Met: []  Not Met: []  Adjusted  5. Pt will demonstrate I HEP/progression to prevent recurrence of presenting problems    []  Progressing: []  Met: []  Not Met: []  Adjusted     Overall Progression Towards Functional goals/ Treatment Progress Update:  []  Patient is progressing as expected towards functional goals listed.    []  Progression is slowed due to complexities/Impairments listed.  []  Progression has been slowed due to co-morbidities.  [x]  Plan just implemented, too soon (<30days) to assess goals progression   []  Goals require adjustment due to lack of progress  []  Patient is not progressing as expected and requires additional follow up with physician  []  Other:     TREATMENT PLAN     Frequency/Duration: 1-2x/week for 12 weeks for the following treatment interventions:    Interventions:  Therapeutic Exercise (97110) including: strength training, ROM, and  functional mobility  Therapeutic Activities [phone]) including: functional mobility training and education.  Neuromuscular Re-education [phone]) activation and proprioception, including postural re-education.    Gait Training (02883) for normalization of ambulation patterns and AD training.   Manual Therapy [phone]) as indicated to include: Passive Range of Motion, Gr I-IV mobilizations, Soft Tissue Mobilization, Trigger Point Release, and Myofascial Release  Modalities as needed that may include: Electrical Stimulation and Ultrasound  Patient education on joint protection, postural re-education, activity modification, and progression of HEP    Plan: Cont POC- Continue emphasis/focus on modulating pain, promoting relaxation, increasing ROM, reduce/eliminate soft tissue swelling/inflammation/restriction, and improving soft  tissue extensibility. Next visit plan to continue current phase     Electronically Signed by Duwaine Bunde, PT  Date: 06/14/2024     Note: Portions of this note have been templated and/or copied from initial evaluation, reassessments and prior notes for documentation efficiency.    Note: If patient does not return for scheduled/recommended follow up visits, this note will serve as a discharge from care along with the most recent update on progress.    Complex Ortho Evaluation   "

## 2024-06-16 ENCOUNTER — Encounter: Payer: Medicare (Managed Care) | Primary: Family Medicine

## 2024-06-23 ENCOUNTER — Inpatient Hospital Stay: Payer: Medicare (Managed Care) | Primary: Family Medicine

## 2024-06-25 ENCOUNTER — Encounter: Payer: Medicare (Managed Care) | Primary: Family Medicine

## 2024-06-28 ENCOUNTER — Encounter: Payer: Medicare (Managed Care) | Primary: Family Medicine

## 2024-06-30 ENCOUNTER — Encounter: Payer: Medicare (Managed Care) | Primary: Family Medicine

## 2024-07-05 ENCOUNTER — Encounter: Payer: Medicare (Managed Care) | Primary: Family Medicine

## 2024-07-07 ENCOUNTER — Inpatient Hospital Stay: Payer: Medicare (Managed Care) | Primary: Family Medicine

## 2024-07-09 ENCOUNTER — Inpatient Hospital Stay: Payer: Medicare (Managed Care) | Attending: Gastroenterology

## 2024-07-09 ENCOUNTER — Ambulatory Visit: Admit: 2024-07-09 | Payer: Medicare (Managed Care) | Primary: Family Medicine

## 2024-07-09 MED ORDER — LACTATED RINGERS IV SOLN
INTRAVENOUS | Status: DC
Start: 2024-07-09 — End: 2024-07-09
  Administered 2024-07-09: 15:00:00 via INTRAVENOUS

## 2024-07-09 MED ORDER — KETAMINE HCL 20 MG/2ML IJ SOSY
20 | INTRAMUSCULAR | Status: AC
Start: 2024-07-09 — End: 2024-07-09

## 2024-07-09 MED ORDER — PROPOFOL 200 MG/20ML IV EMUL
200 | Freq: Once | INTRAVENOUS | Status: DC | PRN
Start: 2024-07-09 — End: 2024-07-09
  Administered 2024-07-09: 16:00:00 70 via INTRAVENOUS
  Administered 2024-07-09: 16:00:00 125 via INTRAVENOUS

## 2024-07-09 MED ORDER — LIDOCAINE (CARDIAC) 100 MG/5ML IV SOLN (MIXTURES ONLY)
100 | Freq: Once | INTRAVENOUS | Status: DC | PRN
Start: 2024-07-09 — End: 2024-07-09
  Administered 2024-07-09: 16:00:00 100 via INTRAVENOUS

## 2024-07-09 MED ORDER — PROPOFOL 200 MG/20ML IV EMUL
200 | INTRAVENOUS | Status: AC
Start: 2024-07-09 — End: 2024-07-09

## 2024-07-09 MED ORDER — KETAMINE HCL 20 MG/2ML IJ SOSY
20 | Freq: Once | INTRAMUSCULAR | Status: DC | PRN
Start: 2024-07-09 — End: 2024-07-09
  Administered 2024-07-09 (×2): 10 via INTRAVENOUS

## 2024-07-09 MED ORDER — GLYCOPYRROLATE 0.2 MG/ML IJ SOLN
0.2 | Freq: Once | INTRAMUSCULAR | Status: DC | PRN
Start: 2024-07-09 — End: 2024-07-09
  Administered 2024-07-09: 16:00:00 .2 via INTRAVENOUS

## 2024-07-09 MED FILL — KETAMINE HCL 20 MG/2ML IJ SOSY: 20 MG/2ML | INTRAMUSCULAR | Qty: 2 | Fill #0

## 2024-07-09 MED FILL — DIPRIVAN 200 MG/20ML IV EMUL: 200 MG/20ML | INTRAVENOUS | Qty: 60 | Fill #0

## 2024-07-09 NOTE — Progress Notes (Signed)
"  Teaching / education initiated regarding perioperative experience, expectations, and pain management during stay. Patient verbalized understanding.  Bowel prep complete. Pt reports her stool is a dark brown liquid, no solid particle in stool.   "

## 2024-07-09 NOTE — Op Note (Signed)
"  [facility]  Colonoscopy Note            Patient: Gabrielle Wells  DOB: 1950/11/10     Procedure: Colonoscopy    Date:  07/09/2024    Surgeon:  Fredia GORMAN Potash, MD, MD    Anesthesia:  MAC    Indications: High risk screening.  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  This is a follow-up colonoscopy, has not had any issues since then.  She also has multiple myeloma.    Procedure:   An informed consent was obtained from the patient after explanation of indications, benefits, possible risks and complications of the procedure.  The patient was then taken to the endoscopy suite, placed in the left lateral decubitus position, and the above IV anesthesia was administered.    A digital rectal examination was performed and revealed negative without mass, lesions or tenderness.      The Olympus video colonoscope was placed in the patient's rectum under digital direction and advanced to the cecum. The cecum was identified by characteristic anatomy and ballottment.  The prep was fair.      The scope was then withdrawn back through the cecum, ascending, transverse, descending and sigmoid colons.  The scope was then withdrawn into the rectum and retroflexed.    The scope was straightened, the colon was decompressed and the scope was withdrawn from the patient.  The patient tolerated the procedure well and was taken to the PACU in good condition.    Findings:    Cecum: No polyps identified.  There was stool present in the cecum but this was carefully washed and examined in detail.  Ascending Colon: Normal  Transverse Colon: Diverticulosis  Descending Colon: Diverticulosis  Sigmoid Colon: Diverticulosis  Rectum: Internal hemorrhoids    Estimated Blood Loss: None      Impression:    Normal cecum with no recurrence of tumor or new polyps  Diverticulosis  Fair prep  Internal hemorrhoids    Recommendations:      Repeat Colonoscopy in 2 years.    Fredia GORMAN Potash,  MD, MD   [facility]  07/09/2024  "

## 2024-07-09 NOTE — Discharge Instructions (Addendum)
"    ENDOSCOPY DISCHARGE INSTRUCTIONS:    Call the physician that did your procedure for any questions or concern:    [facility]: [phone]  DR. RAVI RAVINUTHALA         ACTIVITY:    There are potential side effects to the medications used for sedation and anesthesia during your procedure.  These include:  Dizziness or light-headedness, confusion or memory loss, delayed reaction times, loss of coordination, nausea and vomiting.  Because of your increased risk for injury, we ask that you observe the following precautions:  For the next 24 hours,  DO NOT operate an automobile, bicycle, motorcycle, lawn mower, power tools or large equipment of any kind.  Do not drink alcohol, sign any legal documents or make any legal decisions for 24 hours.  Do not bend your head over lower than your heart.  DO sit on the side of bed/couch awhile before getting up.  Plan on bedrest or quiet relaxation today.  You may resume normal activities in 24 hours.    DIET:    Your first meal today should be light, avoiding spicy and fatty foods.  If you tolerate this first meal, then you may advance to your regular diet unless otherwise advised by your physician.    NORMAL SYMPTOMS:  -Mild sore throat if youve had an EGD   -Gaseous discomfort    NOTIFY YOUR PHYSICIAN IF THESE SYMPTOMS OCCUR:  1. Fever (greater than 100)  5. Increased abdominal bloating  2. Severe pain    6. Excessive bleeding  3. Nausea and vomiting  7. Chest pain                                                                    4. Chills    8. Shortness of breath    ADDITIONAL INSTRUCTIONS:    Impression:    Normal cecum with no recurrence of tumor or new polyps  Diverticulosis  Fair prep  Internal hemorrhoids     Recommendations:       Repeat Colonoscopy in 2 years.    Biopsy results: Call [facility] for biopsy results in 1 week    Educational Information:          Please review these discharge instructions this evening or tomorrow for  information you may have  forgotten.            We want to thank you for choosing the [facility] as your health care provider. We always strive to provide you with excellent care while you are here. You may receive a survey in the mail regarding your care. We would appreciate you taking a few minutes of your time to complete this survey.  "

## 2024-07-09 NOTE — Anesthesia Pre Procedure (Signed)
 "Department of Anesthesiology  Preprocedure Note       Name:  Gabrielle Wells   Age:  73 y.o.  DOB:  [birth year]                                          MRN:  [id number]         Date:  07/09/2024      Surgeon: Clotilde):  Bedelia Fredia RAMAN, MD    Procedure: Procedure(s):  COLONOSCOPY    Medications prior to admission:   Prior to Admission medications   Medication Sig Start Date End Date Taking? Authorizing Provider   valsartan  (DIOVAN ) 40 MG tablet Take 1 tablet by mouth daily 12/15/23  Yes [provider]   triamcinolone (KENALOG) 0.1 % cream Apply topically 12/10/22  Yes [provider]   carvedilol  (COREG ) 25 MG tablet Take 1 tablet by mouth 2 times daily 03/11/24  Yes Fausto Cower, APRN - CNP   furosemide  (LASIX ) 20 MG tablet Take 3 tablets by mouth daily. May also take 1 tablet daily as needed (3 lbs weight gain). Take 80 mg if wt goes up 3-5lbs. 03/11/24  Yes Fausto Cower, APRN - CNP   oxyCODONE  (ROXICODONE ) 5 MG immediate release tablet Take 1 tablet by mouth every 4 hours as needed for Pain.   Yes [provider]   lenalidomide (REVLIMID) 25 MG chemo capsule Take 1 capsule by mouth daily   Yes [provider]   gabapentin  (NEURONTIN ) 300 MG capsule TAKE ONE CAPSULE BY MOUTH 3 TIMES DAILY  Patient taking differently: Take 1 capsule by mouth 4 times daily. 1 pill in the AM 1 pill at noon 2 pills at night 05/09/22 07/09/24 Yes Kour, Jasleen, MD   atorvastatin  (LIPITOR) 40 MG tablet Take 1 tablet by mouth daily 12/18/21  Yes Qureshi, Saadia H, DO   Cholecalciferol  (VITAMIN D3) 25 MCG TABS TAKE TWO TABLETS BY MOUTH DAILY  Patient taking differently: Take 1 tablet by mouth Once a week at 5 PM 12/18/21  Yes Coni, Saadia H, DO   ondansetron  (ZOFRAN -ODT) 4 MG disintegrating tablet DISSOLVE 1 TABLET ON THE TONGUE EVERY 8 HOURS AS NEEDED FOR NAUSEA 07/16/21  Yes Erle Pac, MD   omeprazole  (PRILOSEC) 20 MG delayed release capsule Take 1 capsule by mouth every morning (before  breakfast) 11/28/20  Yes Nguyen, Huong L, MD   traMADol  (ULTRAM ) 50 MG tablet Take 1 tablet by mouth every 6 hours as needed for Pain.  Patient not taking: Reported on 07/09/2024 02/27/24   [provider]   loperamide (IMODIUM) 2 MG capsule Take 1 capsule by mouth as needed for Diarrhea  Patient not taking: Reported on 07/09/2024 12/03/22   [provider]   ipratropium 0.5 mg-albuterol  2.5 mg (DUONEB ) 0.5-2.5 (3) MG/3ML SOLN nebulizer solution  12/04/23   [provider]   cyclobenzaprine (FLEXERIL) 5 MG tablet Take 1 tablet by mouth 3 times daily as needed for Muscle spasms  Patient not taking: Reported on 07/09/2024    [provider]   apixaban  (ELIQUIS ) 5 MG TABS tablet Take 1 tablet by mouth 2 times daily 12/28/22   [provider]   acyclovir (ZOVIRAX) 400 MG tablet Take 1 tablet by mouth 2 times daily  Patient not taking: Reported on 07/09/2024    [provider]   albuterol  sulfate HFA (VENTOLIN  HFA) 108 (90 Base)  MCG/ACT inhaler Inhale 2 puffs into the lungs 4 times daily as needed for Wheezing  Patient not taking: Reported on 07/09/2024 07/16/21   Erle Pac, MD   acetaminophen  (TYLENOL ) 500 MG tablet Take 1 tablet by mouth every 6 hours as needed for Pain  Patient not taking: Reported on 07/09/2024    [provider]       Current medications:    Current Facility-Administered Medications   Medication Dose Route Frequency Provider Last Rate Last Admin    lactated ringers  infusion   IntraVENous Continuous Jackquline Arley CROME, MD 50 mL/hr at 07/09/24 1039 NoRateChange at 07/09/24 1039       Allergies:    Allergies   Allergen Reactions    Aspirin  Other (See Comments)     Stomach bleeding    Ibuprofen Other (See Comments)     Stomach bleeding    Iodides Hives    Iodinated Contrast Media Hives and Other (See Comments)     Takes Benadryl  50mg  PO before receiving iodinated contrast    Mushroom Hives and Itching    Povidone Iodine Hives and  Itching    Shellfish Allergy  Hives and Itching    Sulfa Antibiotics Hives, Itching and Nausea And Vomiting    Sulfasalazine Hives and Itching    Vancomycin Other (See Comments)     Drug induced NEUTROPENIA    Amoxicillin-Pot Clavulanate Itching and Other (See Comments)     Has never had problems with amoxicillin/ penicillin; HAS TOLERATED ZOSYN WITH ZERO PROBLEMS    Ciprofloxacin  Other (See Comments)     Made bottom raw    Coconut (Cocos Nucifera) Itching    Codeine Itching and Other (See Comments)    Egg Protein-Containing Drug Products Nausea And Vomiting    Fish Protein-Containing Drug Products Nausea And Vomiting    Iodine Itching    Metronidazole  Diarrhea and Other (See Comments)     Made bottom raw    Soap & Cleansers Rash and Other (See Comments)     Detergents    Sulfacetamide Rash       Problem List:    Patient Active Problem List   Diagnosis Code    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    Resistant hypertension I1A.0    GERD (gastroesophageal reflux disease) K21.9    Back wound S21.209A    AKI (acute kidney injury) N17.9    CKD (chronic kidney disease), 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    Disorder involving thrombocytopenia D69.6    Vitamin D  deficiency E55.9    Hyperlipidemia E78.5    Morbid obesity (HCC) E66.01    BPPV (benign paroxysmal positional vertigo) H81.10    Fall at home, initial encounter W19.XXXA, Y92.009    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    Suspected condition R69    Acute blood loss anemia D62    Backache M54.9    Severe hypertension I10    Chronic pain disorder G89.4    Community acquired pneumonia J18.9    CSF leak G96.00    Depression F32.A    Diarrhea R19.7    Discitis of lumbosacral region M46.47    Elevated lipase R74.8    Fever and chills R50.9    Fracture of neck of left humerus, closed, initial encounter S42.212A    Fracture of T10 vertebra (HCC) S22.079A    Generalized anxiety disorder F41.1    Glenohumeral  arthritis M19.019    Proteus infection A49.8    Humerus fracture S42.309A  Hypotensive episode I95.9    Intertrigo L30.4    Obstipation K59.00    Klebsiella infection A49.8    Labile blood pressure R09.89    Leukopenia D72.819    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, 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    Neuropathic pain M79.2    Obesity hypoventilation syndrome (HCC) E66.2    Other specified anemias D64.89    Physical deconditioning R53.81    Pill dysphagia R13.10    Postoperative pain G89.18    Protein-calorie malnutrition, severe E43    Pruritic condition L29.9    Zoster B02.9    Wound infection after surgery T81.49XA    CHF (congestive heart failure) (HCC) I50.9    Multiple myeloma (HCC) C90.00    Weakness generalized R53.1       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    s/i I&D and removaal of hardware    GERD (gastroesophageal reflux disease)     HTN (hypertension)     Hyperlipidemia     IBS (irritable bowel syndrome)     Incontinence of urine     Lumbar radiculopathy     Morbid obesity with BMI of 45.0-49.9, adult (HCC)     Multiple allergies     Multiple myeloma (HCC)     Muscle fasciculation     Neuropathic pain     back 2/2 to spinal surgeries    Prolonged emergence from general anesthesia     SBO (small bowel obstruction) (HCC) 07/1999    with partial colectomy    Spinal fracture of T8 vertebra (HCC)     s/p fusion     Surgical site infection     back, spinal hardware     Urinary incontinence     Uses walker        Past Surgical History:        Procedure Laterality Date    ABDOMEN SURGERY N/A 05/16/2021    ABDOMINAL FAT PAD EXCISIONAL BIOPSY performed by Sheril GORMAN JONELLE Lethia, MD  at [facility] OR    ABDOMINAL HERNIA REPAIR  2010    APPENDECTOMY      BREAST LUMPECTOMY Bilateral     benign lesions    CARDIAC CATHETERIZATION  2012    CHOLECYSTECTOMY      COLONOSCOPY      COLONOSCOPY  05/26/2020    COLONOSCOPY POLYPECTOMY SNARE/COLD BIOPSY performed by Alm JINNY Minder, MD at [facility] ENDOSCOPY    COLONOSCOPY  05/26/2020    COLONOSCOPY WITH BIOPSY performed by Alm JINNY Minder, MD at [facility] ENDOSCOPY    COLONOSCOPY N/A 11/08/2022    COLONOSCOPY WITH BIOPSY, COLD SNARE/HOT SNARE performed by Minder Alm JINNY, MD at [facility] ENDOSCOPY    COLONOSCOPY N/A 12/16/2022    COLONOSCOPY BIOPSY performed by Minder Alm JINNY, MD at [facility] ENDOSCOPY    CT BIOPSY BONE MARROW  01/03/2021    CT BONE MARROW BIOPSY 01/03/2021 [facility] CT SCAN    HYSTERECTOMY, TOTAL ABDOMINAL (CERVIX REMOVED)      OVARY REMOVAL      PORT SURGERY Left 12/11/2022    LEFT SUBCLAVIAN  PORT PLACEMENT performed by Ruffus Darleene BIRCH, MD at [facility] OR    TOTAL KNEE ARTHROPLASTY Bilateral 2009    TOTAL SHOULDER ARTHROPLASTY W/ DISTAL CLAVICLE EXCISION Right 2010    UPPER GASTROINTESTINAL ENDOSCOPY N/A 05/26/2020    EGD BIOPSY performed by Alm JINNY Minder, MD at [facility] ENDOSCOPY    XR MIDLINE EQUAL OR GREATER THAN 5 YEARS  07/28/2018    XR MIDLINE EQUAL OR GREATER THAN 5 YEARS 07/28/2018       Social History:    Social History     Tobacco Use    Smoking status: Never    Smokeless tobacco: Never   Substance Use Topics    Alcohol use: Never                                Counseling given: Not Answered      Vital Signs (Current):   Vitals:    07/09/24 0954   BP: (!) 174/79   Pulse: 83   Resp: 16   Temp: 98.2 F (36.8 C)   TempSrc: Temporal   SpO2: 100%   Weight: 112 kg (247 lb)   Height: 1.6 m (5' 3)                                              BP Readings from Last 3 Encounters:   07/09/24 (!) 174/79   04/02/24 (!) 152/88   03/11/24 (!) 152/88       NPO Status: Time of last liquid consumption: 0730                        Time of last solid consumption: 1600                         Date of last liquid consumption: 07/09/24                        Date of last solid food consumption: 07/08/24    BMI:   Wt Readings from Last 3 Encounters:   07/09/24 112 kg (247 lb)   03/11/24 114.8 kg (253 lb)   12/02/23 110.9 kg (244 lb 9.6 oz)     Body mass index is 43.75 kg/m.    CBC:   Lab Results   Component Value Date/Time    WBC 7.3 09/04/2023 11:00 AM    RBC 4.90 09/04/2023 11:00 AM    HGB 14.1 09/04/2023 11:00 AM    HCT 43.4 09/04/2023 11:00 AM    MCV 88.6 09/04/2023 11:00 AM    RDW 13.5 09/04/2023 11:00 AM    PLT 77 09/04/2023 11:00 AM       CMP:   Lab Results   Component Value Date/Time    NA 138 09/05/2023 03:58 AM    K 4.0 09/05/2023 03:58 AM    K 4.0 09/02/2023 12:31 PM    CL 103 09/05/2023 03:58 AM    CO2 26 09/05/2023 03:58 AM    BUN 11 09/05/2023 03:58 AM    CREATININE 1.2 09/05/2023 03:58 AM    GFRAA >60 04/23/2021 10:46 PM    AGRATIO 1.7 08/22/2023 06:59 PM    LABGLOM 47 09/05/2023 03:58 AM    LABGLOM 59 12/05/2022 11:08 AM  GLUCOSE 101 09/05/2023 03:58 AM    CALCIUM  8.7 09/05/2023 03:58 AM    BILITOT 0.5 08/22/2023 06:59 PM    ALKPHOS 77 08/22/2023 06:59 PM    AST 30 08/22/2023 06:59 PM    ALT 9 08/22/2023 06:59 PM       POC Tests: No results for input(s): POCGLU, POCNA, POCK, POCCL, POCBUN, POCHEMO, POCHCT in the last 72 hours.    Coags:   Lab Results   Component Value Date/Time    PROTIME 13.6 02/04/2023 02:16 PM    INR 1.02 02/04/2023 02:16 PM    APTT 20.2 10/01/2022 10:06 AM       HCG (If Applicable): No results found for: PREGTESTUR, PREGSERUM, HCG, HCGQUANT     ABGs: No results found for: PHART, PO2ART, PCO2ART, HCO3ART, BEART, O2SATART     Type & Screen (If Applicable):  No results found for: ABORH, LABANTI    Drug/Infectious Status (If Applicable):  No results found for: HIV, HEPCAB    COVID-19 Screening (If Applicable):   Lab Results   Component Value Date/Time    COVID19 NOT DETECTED 09/02/2023 01:03 PM            Anesthesia Evaluation    Patient summary reviewed and Nursing notes reviewed     history of anesthetic complications:  Airway:  Mallampati: III  TM distance: >3 FB   Neck ROM: full    Mouth opening: > = 3 FB   Dental:           Pulmonary:     breath sounds clear to auscultation  (+) pneumonia:      asthma:                                Cardiovascular:     Exercise tolerance: no interval change    (+)     hypertension:              CHF:                              Rhythm: regular  Rate: normal             ROS comment: 03/2024      Left Ventricle: Normal left ventricular systolic function with a visually estimated EF of 55 - 60%. Not well visualized. Left ventricle size is normal. Normal wall thickness. Grossly normal wall motion.    Right Ventricle: Not well visualized.    There is no significant valvular abnormality.    Pericardium: No pericardial effusion.    Image quality is technically difficult. Contrast used: Lumason . Technically difficult study due to patient's body habitus, procedure performed with the patient in a sitting position and procedure performed with the patient in a supine position.       Neuro/Psych:    (+)   neuromuscular disease:        psychiatric history:                 GI/Hepatic/Renal:    (+)   GERD:                Endo/Other:                 ROS comment: MULTIPLE MYELOMA - HAS A PORT, IN REMISSION           Abdominal:  Vascular:    (+)     PE.      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 Other Findings:              Anesthesia Plan      MAC     ASA 3             Anesthetic plan and risks discussed with patient.      Plan discussed with CRNA.                  Catheryn JONELLE Reining, MD   07/09/2024            "

## 2024-07-09 NOTE — Anesthesia Postprocedure Evaluation (Signed)
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    Aldrete Phase I:      Aldrete Phase II: Aldrete Score: 10    Anesthesia Post Evaluation    Patient location during evaluation: PACU  Patient participation: complete - patient participated  Level of consciousness: awake and awake and alert  Airway patency: patent  Nausea & Vomiting: no nausea and no vomiting  Cardiovascular status: hemodynamically stable  Respiratory status: acceptable  Hydration status: euvolemic  Pain management: adequate and satisfactory to patient      No notable events documented.  "

## 2024-07-09 NOTE — H&P (Signed)
 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    s/i I&D and removaal of hardware    GERD (gastroesophageal reflux disease)     HTN (hypertension)     Hyperlipidemia     IBS (irritable bowel syndrome)     Incontinence of urine     Lumbar radiculopathy     Morbid obesity with BMI of 45.0-49.9, adult (HCC)     Multiple allergies     Multiple myeloma (HCC)     Muscle fasciculation     Neuropathic pain     back 2/2 to spinal surgeries    Prolonged emergence from general anesthesia     SBO (small bowel obstruction) (HCC) 07/1999    with partial colectomy    Spinal fracture of T8 vertebra (HCC)     s/p fusion     Surgical site infection     back, spinal hardware     Urinary incontinence     Uses walker        Medications:   Prior to Admission medications   Medication Sig Start Date End Date Taking? Authorizing Provider   valsartan  (DIOVAN ) 40 MG tablet Take 1 tablet by mouth daily 12/15/23  Yes [provider]   triamcinolone (KENALOG) 0.1 % cream Apply topically 12/10/22  Yes [provider]   carvedilol  (COREG ) 25 MG tablet Take 1 tablet by mouth 2 times daily 03/11/24  Yes Fausto Cower, APRN - CNP   furosemide  (LASIX ) 20 MG tablet Take 3 tablets by mouth daily. May also take 1 tablet daily as needed (3 lbs weight gain). Take 80 mg if wt goes up 3-5lbs. 03/11/24  Yes Fausto Cower, APRN - CNP   oxyCODONE  (ROXICODONE ) 5 MG immediate release tablet Take 1 tablet by mouth every 4 hours as needed for Pain.   Yes [provider]   lenalidomide (REVLIMID) 25 MG chemo capsule Take 1 capsule by mouth daily   Yes [provider]   gabapentin  (NEURONTIN ) 300 MG capsule TAKE ONE CAPSULE BY MOUTH 3 TIMES  DAILY  Patient taking differently: Take 1 capsule by mouth 4 times daily. 1 pill in the AM 1 pill at noon 2 pills at night 05/09/22 07/09/24 Yes Kour, Jasleen, MD   atorvastatin  (LIPITOR) 40 MG tablet Take 1 tablet by mouth daily 12/18/21  Yes Coni, Saadia H, DO   Cholecalciferol  (VITAMIN D3) 25 MCG TABS TAKE TWO TABLETS BY MOUTH DAILY  Patient taking differently: Take 1 tablet by mouth Once a week at 5 PM 12/18/21  Yes Qureshi, Saadia H, DO   ondansetron  (ZOFRAN -ODT) 4 MG disintegrating tablet DISSOLVE 1 TABLET ON THE TONGUE EVERY 8 HOURS AS NEEDED FOR NAUSEA 07/16/21  Yes Erle Pac, MD   omeprazole  (PRILOSEC) 20 MG delayed release capsule Take 1 capsule by mouth every morning (before breakfast) 11/28/20  Yes Nguyen, Huong L, MD   traMADol  (ULTRAM ) 50 MG tablet Take 1 tablet by mouth every 6 hours as needed for Pain.  Patient not taking: Reported on 07/09/2024 02/27/24   [provider]   loperamide (IMODIUM) 2 MG capsule  Take 1 capsule by mouth as needed for Diarrhea  Patient not taking: Reported on 07/09/2024 12/03/22   [provider]   ipratropium 0.5 mg-albuterol  2.5 mg (DUONEB ) 0.5-2.5 (3) MG/3ML SOLN nebulizer solution  12/04/23   [provider]   cyclobenzaprine (FLEXERIL) 5 MG tablet Take 1 tablet by mouth 3 times daily as needed for Muscle spasms  Patient not taking: Reported on 07/09/2024    [provider]   apixaban  (ELIQUIS ) 5 MG TABS tablet Take 1 tablet by mouth 2 times daily 12/28/22   [provider]   acyclovir (ZOVIRAX) 400 MG tablet Take 1 tablet by mouth 2 times daily  Patient not taking: Reported on 07/09/2024    [provider]   albuterol  sulfate HFA (VENTOLIN  HFA) 108 (90 Base) MCG/ACT inhaler Inhale 2 puffs into the lungs 4 times daily as needed for Wheezing  Patient not taking: Reported on 07/09/2024 07/16/21   Erle Pac, MD   acetaminophen  (TYLENOL ) 500 MG tablet Take 1 tablet by mouth every 6 hours as needed for Pain  Patient  not taking: Reported on 07/09/2024    [provider]       Allergies:   Allergies   Allergen Reactions    Aspirin  Other (See Comments)     Stomach bleeding    Ibuprofen Other (See Comments)     Stomach bleeding    Iodides Hives    Iodinated Contrast Media Hives and Other (See Comments)     Takes Benadryl  50mg  PO before receiving iodinated contrast    Mushroom Hives and Itching    Povidone Iodine Hives and Itching    Shellfish Allergy  Hives and Itching    Sulfa Antibiotics Hives, Itching and Nausea And Vomiting    Sulfasalazine Hives and Itching    Vancomycin Other (See Comments)     Drug induced NEUTROPENIA    Amoxicillin-Pot Clavulanate Itching and Other (See Comments)     Has never had problems with amoxicillin/ penicillin; HAS TOLERATED ZOSYN WITH ZERO PROBLEMS    Ciprofloxacin  Other (See Comments)     Made bottom raw    Coconut (Cocos Nucifera) Itching    Codeine Itching and Other (See Comments)    Egg Protein-Containing Drug Products Nausea And Vomiting    Fish Protein-Containing Drug Products Nausea And Vomiting    Iodine Itching    Metronidazole  Diarrhea and Other (See Comments)     Made bottom raw    Soap & Cleansers Rash and Other (See Comments)     Detergents    Sulfacetamide Rash       PSHx:   Past Surgical History:   Procedure Laterality Date    ABDOMEN SURGERY N/A 05/16/2021    ABDOMINAL FAT PAD EXCISIONAL BIOPSY performed by Sheril GORMAN JONELLE Lethia, MD at [facility] OR    ABDOMINAL HERNIA REPAIR  2010    APPENDECTOMY      BREAST LUMPECTOMY Bilateral     benign lesions    CARDIAC CATHETERIZATION  2012    CHOLECYSTECTOMY      COLONOSCOPY      COLONOSCOPY  05/26/2020    COLONOSCOPY POLYPECTOMY SNARE/COLD BIOPSY performed by Alm JINNY Minder, MD at [facility] ENDOSCOPY    COLONOSCOPY  05/26/2020    COLONOSCOPY WITH BIOPSY performed by Alm JINNY Minder, MD at [facility] ENDOSCOPY    COLONOSCOPY N/A 11/08/2022    COLONOSCOPY WITH BIOPSY, COLD SNARE/HOT SNARE performed by Minder Alm JINNY, MD at [facility] ENDOSCOPY     COLONOSCOPY N/A 12/16/2022  COLONOSCOPY BIOPSY performed by Tisha Alm PARAS, MD at [facility] ENDOSCOPY    CT BIOPSY BONE MARROW  01/03/2021    CT BONE MARROW BIOPSY 01/03/2021 [facility] CT SCAN    HYSTERECTOMY, TOTAL ABDOMINAL (CERVIX REMOVED)      OVARY REMOVAL      PORT SURGERY Left 12/11/2022    LEFT SUBCLAVIAN PORT PLACEMENT performed by Ruffus Darleene BIRCH, MD at [facility] OR    TOTAL KNEE ARTHROPLASTY Bilateral 2009    TOTAL SHOULDER ARTHROPLASTY W/ DISTAL CLAVICLE EXCISION Right 2010    UPPER GASTROINTESTINAL ENDOSCOPY N/A 05/26/2020    EGD BIOPSY performed by Alm PARAS Tisha, MD at [facility] ENDOSCOPY    XR MIDLINE EQUAL OR GREATER THAN 5 YEARS  07/28/2018    XR MIDLINE EQUAL OR GREATER THAN 5 YEARS 07/28/2018       Social Hx:   Social History     Socioeconomic History    Marital status: Single     Spouse name: Not on file    Number of children: Not on file    Years of education: Not on file    Highest education level: Not on file   Occupational History    Not on file   Tobacco Use    Smoking status: Never    Smokeless tobacco: Never   Vaping Use    Vaping status: Never Used   Substance and Sexual Activity    Alcohol use: Never    Drug use: Never    Sexual activity: Not on file   Other Topics Concern    Not on file   Social History Narrative    Not on file     Social Drivers of Health     Financial Resource Strain: Not on file   Food Insecurity: No Food Insecurity (09/02/2023)    Hunger Vital Sign     Worried About Running Out of Food in the Last Year: Never true     Ran Out of Food in the Last Year: Never true   Transportation Needs: No Transportation Needs (09/02/2023)    PRAPARE - Therapist, Art (Medical): No     Lack of Transportation (Non-Medical): No   Physical Activity: Not on file   Stress: Not on file   Social Connections: Not on file   Intimate Partner Violence: Unknown (09/08/2022)    Received from [facility] and [employer]     Feel physically or emotionally  unsafe where currently live: Not on file     Harm by anyone: Not on file     Emotionally Harmed: Not on file   Housing Stability: Low Risk  (09/02/2023)    Housing Stability Vital Sign     Unable to Pay for Housing in the Last Year: No     Number of Times Moved in the Last Year: 1     Homeless in the Last Year: No       Family Hx:   Family History   Problem Relation Age of Onset    Cancer Paternal Grandmother         unk     Breast Cancer Maternal Aunt        Physical Exam:  Vital Signs: BP (!) 174/79   Pulse 83   Temp 98.2 F (36.8 C) (Temporal)   Resp 16   Ht 1.6 m (5' 3)   Wt 112 kg (247 lb)   SpO2 100%   BMI  43.75 kg/m    Airway: Mallampati: II (soft palate, uvula, fauces visible)  Pulmonary:Normal  Cardiac:Normal  Abdomen:Normal    Pre-Procedure Assessment / Plan:  ASA: Class 3 - A patient with severe systemic disease that limits activity but is not incapacitating  Level of Sedation Plan:Deep sedation  Post Procedure plan: Return to same level of care    I assessed the patient and find that the patient is in satisfactory condition to proceed with the planned procedure and sedation plan.    I have explained the risk, benefits, and alternatives to the procedure; the patient understands and agrees to proceed.       Fredia GORMAN Potash, MD  07/09/2024    "

## 2024-07-12 ENCOUNTER — Inpatient Hospital Stay: Payer: Medicare (Managed Care) | Primary: Family Medicine

## 2024-07-14 ENCOUNTER — Inpatient Hospital Stay: Admit: 2024-07-14 | Discharge: 2024-07-14 | Payer: Medicare (Managed Care) | Primary: Family Medicine

## 2024-07-14 DIAGNOSIS — M542 Cervicalgia: Principal | ICD-10-CM

## 2024-07-14 NOTE — Flowsheet Note (Addendum)
 "     [facility]: [address], [place], [place] [address] office: [phone] fax: [phone]     Physical Therapy Re-Certification Plan of Care    Dear Gabrielle Ovens, MD  ,    We had the pleasure of treating the following patient for physical therapy services at [facility]. A summary of our findings can be found in the updated assessment below.  This includes our plan of care.  If you have any questions or concerns regarding these findings, please do not hesitate to contact me at the office phone number checked above.  Thank you for the referral.     Physician Signature:________________________________Date:__________________  By signing above (or electronic signature), therapist's plan is approved by physician      Total Visits: 3     Overall Response to Treatment:  Pt reports improvement following PT eval and 1 treatment, but hasn't been seen in the past month with inability to determine overall tolerance and progress made    Recommendation:    [x]  Continue PT 1x / wk for 4 weeks.   []  Hold PT, pending MD visit   []  Discharge to HEP. Follow up with PT or MD PRN.    Physical Therapy: TREATMENT/PROGRESS NOTE   Patient: Gabrielle Wells (73 y.o. female)   Examination Date: 07/14/2024   DOB:  10-Feb-1951 MRN: [id number]   Visit #: 3   Insurance Allowable Auth Needed   16v 10/15-12/12 [x] Yes    [] No    Insurance: Payor: [employer] MEDICARE / Plan: [employer] GOLD PLUS HMO / Product Type: *No Product type* /   Insurance ID: [id number] - (Medicare Managed)  Secondary Insurance (if applicable): [employer] [place]  *   Primary Treatment Diagnosis (choose one):  M54.2, G89.29    CHRONIC NECK PAIN    Secondary Treatment Diagnoses:  M54.59, G89.29    CHRONIC LOW BACK PAIN     Medical Diagnosis:  Multiple Myeloma [C90.00]   Referring Physician: Ovens Laverda, MD  PCP: Agapito Alfonso CROME, MD     Plan of care signed (Y/N):     Date of Patient  follow up with Physician:      Plan of Care Report: YES, Date Range for this report: 06/02/24 to 07/14/24 (eval + 1 visit made)  POC update due: 08/11/24 (10 visits /OR AUTH LIMITS, whichever is less)                                              Medical History:  Comorbidities:  Multiple Myeloma, chemotherapy,Colon CA, arthritis, asthma, CHF,HTN,HLD, appendectomy, abdominal hernia repair, CKD, morbid obesity, spinal surgery x 5, L RTC tear, R TSR, B TKR, falls, T8 Fx-fusion x 2 surgeries, incontinence, hosp in Jan for PNA  Relevant Medical History: chronic LBP and neck pain                                         Precautions/ Contra-indications:           Latex allergy :  NO  Pacemaker:    NO  Contraindications for Manipulation: NA  Date of Surgery: NA  Other:    Red Flags:  None    Suicide Screening:   The patient did  not verbalize a primary behavioral concern, suicidal ideation, suicidal intent, or demonstrate suicidal behaviors.    Preferred Language for Healthcare:  English    SUBJECTIVE EXAMINATION     Patient stated complaint/comments: recent colonoscopy with no CA this time,  reports this is the first day my neck really started hurting since last time I saw you, pain remains severe in low back,    Test used Initial score  06/02/24 07/14/2024   Pain Summary VAS Neck 7/10  LBP 5/10 L Neck 4/10, LBP 6-7/10   Functional questionnaire NDI  MODI 34/50 (68%)  25/50 (50%) 21/50 (42%)  29/50 (58%)   Other:              OBJECTIVE EXAMINATION     06/02/24  Observation: Severe rounded shoulder girdle, FHP, Dowagers hump/increased kyphosis  Cervical ROM, flexion WNL, ext: 0 LSB 15, RSB 20, LR 22, RR 45  Using FWW with decreased functional speed  Functional Tests:  30 Second Sit to Stand: UE assist x 4  reps  TUG: 30sec w/FWW, UE assist    Test measurements: 07/14/24  Observation; severe kyphosis, FHP, rounded shoulder girdle  Cervical ROM: flexion: WNL, ext 0, LSB 15, RSB 17, LR 25, RR 30  Thirty sec STS: 6 (UE  assist)  TUG: 28 sec (w/walker)        Exercises/Interventions       Therapeutic Ex (02889)  Resistance Sets/time Reps Notes/Cues/Progressions          Shoulder blade squeezes   5-10    pulleys   6-8    Thirty sec STS   6 UE assist   TUG   28 sec W/walker and UE assist   NMR re-education (02887)                            Therapeutic Activity (97530)       Max A supine>sit                     Manual Intervention (97140) Time:  15 min   Manual cervical traction, occipital base release, PROM all direction and gentle stretching upper traps,levator and pecs, PROM/stretching B shoulders, GH distraction             Gait Training (02883)           Modalities:    No modalities applied this session    Education/Home Exercise Program:        ASSESSMENT       Today's Assessment: Gabrielle Wells is a 73 y.o. female referred to OPPT for chronic back and neck pain with generalized weakness due to ongoing treatment for multiple myeloma which is in remission.   Pt continues with FWW and poor functional mobility due to chronic pain and generalized weakness. Pt demonstrates deficits in postural alignment, neuromuscular control, UE ROM,strength, and is unable to lie flat due to significant postural deficits requiring several pillows under head for support. Pt has a hx of multiple spinal surgeries with limited lumbar ROM, core strength and proximal hip stabilization also limiting functional mobility. Pt has been seen for an evaluation and 1 treatment over the past  6 weeks with more time needed to improve functional outcomes. Pt did however show some improvement in TUG and Thirty second STS testing indicating improved functional mobility and decreased risk for falls. Pt progress will be slow due to multiple co morbidities with continued skilled PT  recommended.     Medical Necessity Documentation:  I certify that this patient meets the below criteria necessary for medical necessity for care and/or justification of therapy  services:  The patient has functional impairments and/or activity limitations and would benefit from continued outpatient therapy services to address the deficits outlined in the patients goals  The patient has a musculoskeletal condition(s) with a corresponding ICD-10 code that is of complexity and severity that require skilled therapeutic intervention. This has a direct and significant impact on the need for therapy and significantly impacts the rate of recovery.   The patient has associated co-morbidities along with primary diagnosis which significantly impact the rate of recovery and contribute to complexities that require skilled therapeutic intervention    Prognosis for POC: [x]  Good []  Fair  []  Poor    Patient requires continued skilled intervention: [x]  Yes  []  No    CHARGE CAPTURE     PT CHARGE GRID   CPT Code (TIMED) minutes # CPT Code (UNTIMED) #     Therex (97110)     EVAL:MODERATE (02837 - Typically 30 minutes face-to-face)     Neuromusc. Re-ed (97112)    Re-Eval (02835)     Manual (97140) 23 2  Estim Unattended (97014) 1    Ther. Act (02469)    Mech. Traction [phone])     Gait [phone])    Dry Needle 1-2 muscle (79439)     Aquatic Therex [phone])    Dry Needle 3+ muscle (79438)     Iontophoresis (02966)    VASO (02983)     Ultrasound (02964)    Group Therapy (02849)     Estim Attended (02967)    Canalith Repositioning (04007)     Physical Performance Test [phone])    Custom orthotic (O6979)     Other:    Other: 1   Total Timed Code Tx Minutes 23 2       Total Treatment Minutes 23        Charge Justification:  (97140) MANUAL THERAPY -  Manual therapy techniques, 1 or more regions, each 15 minutes (Mobilization/manipulation, manual lymphatic drainage, manual traction) for the purpose of modulating pain, promoting relaxation,  increasing ROM, reducing/eliminating soft tissue swelling/inflammation/restriction, improving soft tissue extensibility and allowing for proper ROM for normal function with self care,  mobility, lifting and ambulation      GOALS     Patient stated goal: decrease pain and walk better  [x]  Progressing: []  Met: []  Not Met: []  Adjusted    Therapist goals for Patient:   Short Term Goals: To be achieved in: 4 weeks  1. Pt will demonstrate I supine<> sitting with proper body mechanics and back protection  [x]  Progressing: []  Met: []  Not Met: []  Adjusted  2. Patient will have a decrease in pain to <4/10 in the neck to facilitate improvement in movement, sleeping, and ADLs   [x]  Progressing: []  Met: []  Not Met: []  Adjusted    Long Term Goals: To be achieved in: 12 weeks  1. Disability index score of 30% or less for the Neck Disability Index to assist with reaching prior level of function with activities such as sleeping, ADLs and recreational activity.  [x]  Progressing: []  Met: []  Not Met: []  Adjusted  2. Patient will demonstrate increased AROM of B knees to 110 degrees without pain to allow for proper joint functioning to enable patient to stand from normal height surfaces in/out of  the home .   [x]  Progressing: []   Met: []  Not Met: []  Adjusted  3. Patient will demonstrate increased Strength of BLEs to at least 3/5 throughout without pain to allow for proper functional mobility to enable patient to return to I ADLs and functional mobility as PLOF.   [x]  Progressing: []  Met: []  Not Met: []  Adjusted  4. Patient will return to sleeping in the bed without increased symptoms or restriction.   [x]  Progressing: []  Met: []  Not Met: []  Adjusted  5. Pt will demonstrate I HEP/progression to prevent recurrence of presenting problems    [x]  Progressing: []  Met: []  Not Met: []  Adjusted     Overall Progression Towards Functional goals/ Treatment Progress Update:  []  Patient is progressing as expected towards functional goals listed.    [x]  Progression is slowed due to complexities/Impairments listed.  []  Progression has been slowed due to co-morbidities.  []  Plan just implemented, too soon (<30days) to assess goals  progression   []  Goals require adjustment due to lack of progress  []  Patient is not progressing as expected and requires additional follow up with physician  []  Other:     TREATMENT PLAN     Frequency/Duration: 1-2x/week for 12 weeks for the following treatment interventions:    Interventions:  Therapeutic Exercise (97110) including: strength training, ROM, and functional mobility  Therapeutic Activities (97530) including: functional mobility training and education.  Neuromuscular Re-education [phone]) activation and proprioception, including postural re-education.    Gait Training (02883) for normalization of ambulation patterns and AD training.   Manual Therapy [phone]) as indicated to include: Passive Range of Motion, Gr I-IV mobilizations, Soft Tissue Mobilization, Trigger Point Release, and Myofascial Release  Modalities as needed that may include: Electrical Stimulation and Ultrasound  Patient education on joint protection, postural re-education, activity modification, and progression of HEP    Plan: Cont POC- Continue emphasis/focus on modulating pain, promoting relaxation, increasing ROM, reduce/eliminate soft tissue swelling/inflammation/restriction, and improving soft tissue extensibility. Next visit plan to continue current phase  Extend current POC 1x/wk x 4 more weeks    Electronically Signed by Duwaine Bunde, PT  Date: 07/14/2024     Note: Portions of this note have been templated and/or copied from initial evaluation, reassessments and prior notes for documentation efficiency.    Note: If patient does not return for scheduled/recommended follow up visits, this note will serve as a discharge from care along with the most recent update on progress.    Complex Ortho Evaluation   "

## 2024-09-16 ENCOUNTER — Encounter: Payer: MEDICAID | Attending: Nurse Practitioner | Primary: Family Medicine

## 2024-10-04 ENCOUNTER — Encounter: Payer: Medicare (Managed Care) | Attending: Nephrology | Primary: Family Medicine
